# Patient Record
Sex: Female | Born: 1935 | Race: Black or African American | Hispanic: No | Smoking: Former smoker
Health system: Southern US, Community
[De-identification: ages and names within clinical notes are randomized; demographics above are authoritative.]

## PROBLEM LIST (undated history)

## (undated) DIAGNOSIS — M810 Age-related osteoporosis without current pathological fracture: Secondary | ICD-10-CM

## (undated) DIAGNOSIS — M549 Dorsalgia, unspecified: Secondary | ICD-10-CM

## (undated) DIAGNOSIS — R0602 Shortness of breath: Secondary | ICD-10-CM

## (undated) DIAGNOSIS — I2510 Atherosclerotic heart disease of native coronary artery without angina pectoris: Secondary | ICD-10-CM

## (undated) DIAGNOSIS — I498 Other specified cardiac arrhythmias: Secondary | ICD-10-CM

## (undated) DIAGNOSIS — I10 Essential (primary) hypertension: Secondary | ICD-10-CM

## (undated) DIAGNOSIS — E785 Hyperlipidemia, unspecified: Secondary | ICD-10-CM

## (undated) DIAGNOSIS — E119 Type 2 diabetes mellitus without complications: Secondary | ICD-10-CM

## (undated) DIAGNOSIS — I499 Cardiac arrhythmia, unspecified: Secondary | ICD-10-CM

## (undated) DIAGNOSIS — Z9981 Dependence on supplemental oxygen: Secondary | ICD-10-CM

## (undated) DIAGNOSIS — Z8719 Personal history of other diseases of the digestive system: Secondary | ICD-10-CM

## (undated) DIAGNOSIS — G20 Parkinson's disease: Secondary | ICD-10-CM

## (undated) DIAGNOSIS — C3490 Malignant neoplasm of unspecified part of unspecified bronchus or lung: Secondary | ICD-10-CM

## (undated) DIAGNOSIS — I5181 Takotsubo syndrome: Secondary | ICD-10-CM

## (undated) DIAGNOSIS — K5792 Diverticulitis of intestine, part unspecified, without perforation or abscess without bleeding: Secondary | ICD-10-CM

## (undated) DIAGNOSIS — I509 Heart failure, unspecified: Secondary | ICD-10-CM

## (undated) DIAGNOSIS — G20A1 Parkinson's disease without dyskinesia, without mention of fluctuations: Secondary | ICD-10-CM

## (undated) DIAGNOSIS — I209 Angina pectoris, unspecified: Secondary | ICD-10-CM

## (undated) DIAGNOSIS — M1990 Unspecified osteoarthritis, unspecified site: Secondary | ICD-10-CM

## (undated) DIAGNOSIS — J449 Chronic obstructive pulmonary disease, unspecified: Secondary | ICD-10-CM

## (undated) HISTORY — PX: ABDOMINAL HYSTERECTOMY: SHX81

## (undated) HISTORY — PX: LUNG CANCER SURGERY: SHX702

## (undated) HISTORY — DX: Chronic obstructive pulmonary disease, unspecified: J44.9

## (undated) HISTORY — DX: Heart failure, unspecified: I50.9

## (undated) HISTORY — PX: ECTOPIC PREGNANCY SURGERY: SHX613

## (undated) HISTORY — DX: Malignant neoplasm of unspecified part of unspecified bronchus or lung: C34.90

## (undated) HISTORY — PX: CHOLECYSTECTOMY: SHX55

## (undated) HISTORY — DX: Age-related osteoporosis without current pathological fracture: M81.0

---

## 2011-03-05 ENCOUNTER — Other Ambulatory Visit (HOSPITAL_COMMUNITY): Payer: Self-pay | Admitting: Nephrology

## 2011-03-05 DIAGNOSIS — N289 Disorder of kidney and ureter, unspecified: Secondary | ICD-10-CM

## 2011-03-20 ENCOUNTER — Ambulatory Visit (HOSPITAL_COMMUNITY)
Admission: RE | Admit: 2011-03-20 | Discharge: 2011-03-20 | Disposition: A | Discharge status: Home / Self Care | Payer: Medicare Other | Source: Ambulatory Visit | Attending: Nephrology | Admitting: Nephrology

## 2011-03-20 DIAGNOSIS — N289 Disorder of kidney and ureter, unspecified: Secondary | ICD-10-CM | POA: Insufficient documentation

## 2011-03-20 DIAGNOSIS — I10 Essential (primary) hypertension: Secondary | ICD-10-CM | POA: Insufficient documentation

## 2011-03-20 DIAGNOSIS — E119 Type 2 diabetes mellitus without complications: Secondary | ICD-10-CM | POA: Insufficient documentation

## 2011-05-18 ENCOUNTER — Emergency Department (HOSPITAL_COMMUNITY)
Admission: EM | Admit: 2011-05-18 | Discharge: 2011-05-18 | Disposition: A | Discharge status: Home / Self Care | Payer: Medicare Other | Attending: Emergency Medicine | Admitting: Emergency Medicine

## 2011-05-18 ENCOUNTER — Emergency Department (HOSPITAL_COMMUNITY): Payer: Medicare Other

## 2011-05-18 ENCOUNTER — Encounter (HOSPITAL_COMMUNITY): Payer: Self-pay | Admitting: Radiology

## 2011-05-18 DIAGNOSIS — R1032 Left lower quadrant pain: Secondary | ICD-10-CM | POA: Insufficient documentation

## 2011-05-18 DIAGNOSIS — I10 Essential (primary) hypertension: Secondary | ICD-10-CM | POA: Insufficient documentation

## 2011-05-18 DIAGNOSIS — Z79899 Other long term (current) drug therapy: Secondary | ICD-10-CM | POA: Insufficient documentation

## 2011-05-18 DIAGNOSIS — E119 Type 2 diabetes mellitus without complications: Secondary | ICD-10-CM | POA: Insufficient documentation

## 2011-05-18 DIAGNOSIS — K5730 Diverticulosis of large intestine without perforation or abscess without bleeding: Secondary | ICD-10-CM | POA: Insufficient documentation

## 2011-05-18 LAB — URINALYSIS, ROUTINE W REFLEX MICROSCOPIC
Bilirubin Urine: NEGATIVE
Glucose, UA: NEGATIVE mg/dL
Hgb urine dipstick: NEGATIVE
Ketones, ur: NEGATIVE mg/dL
Nitrite: NEGATIVE
Protein, ur: NEGATIVE mg/dL
Specific Gravity, Urine: 1.02 (ref 1.005–1.030)
Urobilinogen, UA: 0.2 mg/dL (ref 0.0–1.0)
pH: 5.5 (ref 5.0–8.0)

## 2011-05-18 LAB — BASIC METABOLIC PANEL
BUN: 27 mg/dL — ABNORMAL HIGH (ref 6–23)
CO2: 25 mEq/L (ref 19–32)
Calcium: 10.1 mg/dL (ref 8.4–10.5)
Chloride: 103 mEq/L (ref 96–112)
Creatinine, Ser: 1.65 mg/dL — ABNORMAL HIGH (ref 0.4–1.2)
GFR calc Af Amer: 37 mL/min — ABNORMAL LOW (ref >60)
GFR calc non Af Amer: 30 mL/min — ABNORMAL LOW (ref >60)
Glucose, Bld: 122 mg/dL — ABNORMAL HIGH (ref 70–99)
Potassium: 5 mEq/L (ref 3.5–5.1)
Sodium: 137 mEq/L (ref 135–145)

## 2011-05-18 LAB — CBC
HCT: 40.7 % (ref 36.0–46.0)
Hemoglobin: 13 g/dL (ref 12.0–15.0)
MCH: 27.6 pg (ref 26.0–34.0)
MCHC: 31.9 g/dL (ref 30.0–36.0)
MCV: 86.4 fL (ref 78.0–100.0)
Platelets: 263 10*3/uL (ref 150–400)
RBC: 4.71 MIL/uL (ref 3.87–5.11)
RDW: 15.6 % — ABNORMAL HIGH (ref 11.5–15.5)
WBC: 8 10*3/uL (ref 4.0–10.5)

## 2011-05-18 LAB — DIFFERENTIAL
Basophils Absolute: 0.1 10*3/uL (ref 0.0–0.1)
Basophils Relative: 1 % (ref 0–1)
Eosinophils Absolute: 0.7 10*3/uL (ref 0.0–0.7)
Eosinophils Relative: 9 % — ABNORMAL HIGH (ref 0–5)
Lymphocytes Relative: 25 % (ref 12–46)
Lymphs Abs: 2 10*3/uL (ref 0.7–4.0)
Monocytes Absolute: 1 10*3/uL (ref 0.1–1.0)
Monocytes Relative: 12 % (ref 3–12)
Neutro Abs: 4.2 10*3/uL (ref 1.7–7.7)
Neutrophils Relative %: 53 % (ref 43–77)

## 2011-05-18 LAB — URINE MICROSCOPIC-ADD ON

## 2012-04-19 ENCOUNTER — Emergency Department (HOSPITAL_COMMUNITY)
Admission: EM | Admit: 2012-04-19 | Discharge: 2012-04-19 | Disposition: A | Discharge status: Home / Self Care | Payer: Medicare Other | Attending: Emergency Medicine | Admitting: Emergency Medicine

## 2012-04-19 ENCOUNTER — Encounter (HOSPITAL_COMMUNITY): Payer: Self-pay | Admitting: *Deleted

## 2012-04-19 ENCOUNTER — Emergency Department (HOSPITAL_COMMUNITY): Payer: Medicare Other

## 2012-04-19 DIAGNOSIS — Z79899 Other long term (current) drug therapy: Secondary | ICD-10-CM | POA: Insufficient documentation

## 2012-04-19 DIAGNOSIS — Z8739 Personal history of other diseases of the musculoskeletal system and connective tissue: Secondary | ICD-10-CM | POA: Insufficient documentation

## 2012-04-19 DIAGNOSIS — I10 Essential (primary) hypertension: Secondary | ICD-10-CM | POA: Insufficient documentation

## 2012-04-19 DIAGNOSIS — R109 Unspecified abdominal pain: Secondary | ICD-10-CM | POA: Insufficient documentation

## 2012-04-19 DIAGNOSIS — E119 Type 2 diabetes mellitus without complications: Secondary | ICD-10-CM | POA: Insufficient documentation

## 2012-04-19 HISTORY — DX: Unspecified osteoarthritis, unspecified site: M19.90

## 2012-04-19 HISTORY — DX: Diverticulitis of intestine, part unspecified, without perforation or abscess without bleeding: K57.92

## 2012-04-19 HISTORY — DX: Personal history of other diseases of the digestive system: Z87.19

## 2012-04-19 LAB — COMPREHENSIVE METABOLIC PANEL
ALT: 13 U/L (ref 0–35)
AST: 18 U/L (ref 0–37)
Albumin: 3.6 g/dL (ref 3.5–5.2)
Alkaline Phosphatase: 60 U/L (ref 39–117)
BUN: 20 mg/dL (ref 6–23)
CO2: 26 mEq/L (ref 19–32)
Calcium: 10 mg/dL (ref 8.4–10.5)
Chloride: 103 mEq/L (ref 96–112)
Creatinine, Ser: 1.3 mg/dL — ABNORMAL HIGH (ref 0.50–1.10)
GFR calc Af Amer: 45 mL/min — ABNORMAL LOW (ref >90)
GFR calc non Af Amer: 39 mL/min — ABNORMAL LOW (ref >90)
Glucose, Bld: 104 mg/dL — ABNORMAL HIGH (ref 70–99)
Potassium: 4.8 mEq/L (ref 3.5–5.1)
Sodium: 138 mEq/L (ref 135–145)
Total Bilirubin: 0.2 mg/dL — ABNORMAL LOW (ref 0.3–1.2)
Total Protein: 7.5 g/dL (ref 6.0–8.3)

## 2012-04-19 LAB — DIFFERENTIAL
Basophils Absolute: 0 10*3/uL (ref 0.0–0.1)
Basophils Relative: 1 % (ref 0–1)
Eosinophils Absolute: 0.3 10*3/uL (ref 0.0–0.7)
Eosinophils Relative: 6 % — ABNORMAL HIGH (ref 0–5)
Lymphocytes Relative: 31 % (ref 12–46)
Lymphs Abs: 1.8 10*3/uL (ref 0.7–4.0)
Monocytes Absolute: 0.7 10*3/uL (ref 0.1–1.0)
Monocytes Relative: 11 % (ref 3–12)
Neutro Abs: 3.1 10*3/uL (ref 1.7–7.7)
Neutrophils Relative %: 52 % (ref 43–77)

## 2012-04-19 LAB — CBC
HCT: 39.8 % (ref 36.0–46.0)
Hemoglobin: 12.6 g/dL (ref 12.0–15.0)
MCH: 27.5 pg (ref 26.0–34.0)
MCHC: 31.7 g/dL (ref 30.0–36.0)
MCV: 86.7 fL (ref 78.0–100.0)
Platelets: 205 10*3/uL (ref 150–400)
RBC: 4.59 MIL/uL (ref 3.87–5.11)
RDW: 15.2 % (ref 11.5–15.5)
WBC: 5.9 10*3/uL (ref 4.0–10.5)

## 2012-04-19 LAB — URINALYSIS, ROUTINE W REFLEX MICROSCOPIC
Bilirubin Urine: NEGATIVE
Glucose, UA: NEGATIVE mg/dL
Hgb urine dipstick: NEGATIVE
Ketones, ur: NEGATIVE mg/dL
Leukocytes, UA: NEGATIVE
Nitrite: NEGATIVE
Protein, ur: NEGATIVE mg/dL
Specific Gravity, Urine: 1.01 (ref 1.005–1.030)
Urobilinogen, UA: 0.2 mg/dL (ref 0.0–1.0)
pH: 5 (ref 5.0–8.0)

## 2012-04-19 LAB — LACTIC ACID, PLASMA: Lactic Acid, Venous: 1.6 mmol/L (ref 0.5–2.2)

## 2012-04-19 LAB — PROCALCITONIN: Procalcitonin: 0.05 ng/mL

## 2012-04-19 LAB — LIPASE, BLOOD: Lipase: 68 U/L — ABNORMAL HIGH (ref 11–59)

## 2012-04-19 MED ORDER — HYDROCODONE-ACETAMINOPHEN 5-325 MG PO TABS: ORAL_TABLET | ORAL | Status: AC

## 2012-04-19 MED ORDER — SODIUM CHLORIDE 0.9 % IV SOLN
INTRAVENOUS | Status: DC
  Administered 2012-04-19: 14:00:00 via INTRAVENOUS

## 2012-04-19 MED ORDER — IOHEXOL 300 MG/ML  SOLN
100.0000 mL | Freq: Once | INTRAMUSCULAR | Status: AC | PRN
  Administered 2012-04-19: 100 mL via INTRAVENOUS

## 2012-04-19 MED ORDER — ONDANSETRON HCL 4 MG/2ML IJ SOLN
4.0000 mg | INTRAMUSCULAR | Status: DC | PRN
  Administered 2012-04-19: 4 mg via INTRAVENOUS
  Filled 2012-04-19: qty 2

## 2012-04-19 MED ORDER — MORPHINE SULFATE 4 MG/ML IJ SOLN
4.0000 mg | INTRAMUSCULAR | Status: DC | PRN
  Administered 2012-04-19: 4 mg via INTRAVENOUS
  Filled 2012-04-19: qty 1

## 2012-04-19 NOTE — Discharge Instructions (Signed)
RESOURCE GUIDE  Dental Problems  Patients with Medicaid: [facility]                     [facility] [address]                                           [address] Phone:  [phone]                                                  Phone:  [phone]  If unable to pay or uninsured, contact:  [facility] or [facility] to become qualified for the adult dental clinic.  Chronic Pain Problems Contact [facility]  [phone] Patients need to be referred by their primary care doctor.  Insufficient Money for Medicine Contact [employer] Way:  call "211" or [facility] [phone].  No Primary Care Doctor Call [facility]  [phone] Other agencies that provide inexpensive medical care    [facility]  [phone]    [facility]  [phone]    [facility]  [phone]    [facility] Clinic  [phone]    [facility]  [phone]    [facility]  [phone]  Psychological Services [facility]  [phone] [facility]  [phone] [facility]   [phone] (emergency services [phone])  Substance Abuse Resources [facility]  336-882-2125 [facility] 336-784-9470 [facility] 336-285-9073 [facility] 336-845-3988 Residential & Outpatient Substance Abuse Program  800-659-3381  Abuse/Neglect [place] Child Abuse Hotline (336) 641-3795 [place] Child Abuse Hotline 800-378-5315 (After Hours)  Emergency Shelter [facility] (336) 271-5985  Maternity [facility] (336) 275-9566 [facility] (704) [phone]  MRSA Hotline #:   832-7006    [place] [facility]  [facility] [place] [facility]     [employer]                          [facility] [address] [place]                       [address]      [address]  [place]                                                [place]                            [place] Phone:  [phone]                                   Phone:  [phone]                 Phone:  [phone]  [facility] Phone:  [phone]    [facility] Child Abuse Hotline 531-008-4666 [phone] (After Hours)   Take the prescription as directed.  Call your regular medical doctor today to schedule a follow up appointment within the next 3 days.  Return to the Emergency Department immediately sooner if worsening.

## 2012-04-19 NOTE — ED Provider Notes (Signed)
History     CSN: [id number]  Arrival date & time 04/19/12  1134   First MD Initiated Contact with Patient 04/19/12 1224      Chief Complaint  Patient presents with  . Abdominal Pain     HPI Pt seen at 1245.  Per pt, c/o gradual onset and persistence of constant generalized abd "pain" for the past 5 days, worse over the past 2 days.  Describes the pain as "soreness."  Denies N/V/D, no back pain, no fevers, no CP/SOB, no dysuria.  Past Medical History  Diagnosis Date  . Cancer     left lung/ 2009/ surg only  . Hypertension   . Arthritis   . Diverticulitis   . Diabetes mellitus   . H/O ventral hernia     Past Surgical History  Procedure Date  . Cholecystectomy   . Ectopic pregnancy surgery   . Abdominal hysterectomy   . Lung cancer surgery     Family History  Problem Relation Age of Onset  . Diabetes Mother   . Hypertension Mother   . Diabetes Father     History  Substance Use Topics  . Smoking status: Former Games developer  . Smokeless tobacco: Not on file  . Alcohol Use: No     Review of Systems ROS: Statement: All systems negative except as marked or noted in the HPI; Constitutional: Negative for fever and chills. ; ; Eyes: Negative for eye pain, redness and discharge. ; ; ENMT: Negative for ear pain, hoarseness, nasal congestion, sinus pressure and sore throat. ; ; Cardiovascular: Negative for chest pain, palpitations, diaphoresis, dyspnea and peripheral edema. ; ; Respiratory: Negative for cough, wheezing and stridor. ; ; Gastrointestinal: +abd pain. Negative for nausea, vomiting, diarrhea, blood in stool, hematemesis, jaundice and rectal bleeding. . ; ; Genitourinary: Negative for dysuria, flank pain and hematuria. ; ; Musculoskeletal: Negative for back pain and neck pain. Negative for swelling and trauma.; ; Skin: Negative for pruritus, rash, abrasions, blisters, bruising and skin lesion.; ; Neuro: Negative for headache, lightheadedness and neck stiffness. Negative for  weakness, altered level of consciousness , altered mental status, extremity weakness, paresthesias, involuntary movement, seizure and syncope.    Allergies  Review of patient's allergies indicates no known allergies.  Home Medications   Current Outpatient Rx  Name Route Sig Dispense Refill  . ALBUTEROL SULFATE HFA 108 (90 BASE) MCG/ACT IN AERS Inhalation Inhale 2 puffs into the lungs every 6 (six) hours as needed. Shortness of Breath    . ASPIRIN EC 81 MG PO TBEC Oral Take 81 mg by mouth daily.    Marland Kitchen BENAZEPRIL-HYDROCHLOROTHIAZIDE 20-12.5 MG PO TABS Oral Take 1 tablet by mouth daily.    . BUPROPION HCL ER (SR) 150 MG PO TB12 Oral Take 150 mg by mouth 2 (two) times daily.    Marland Kitchen CALCIUM CARBONATE 1250 MG PO TABS Oral Take 1 tablet by mouth daily.    Marland Kitchen CETIRIZINE HCL 10 MG PO TABS Oral Take 10 mg by mouth daily.    . OMEGA-3 FATTY ACIDS 1000 MG PO CAPS Oral Take 1 g by mouth daily.    Marland Kitchen GABAPENTIN 300 MG PO CAPS Oral Take 300 mg by mouth 2 (two) times daily.    Marland Kitchen GLIPIZIDE 5 MG PO TABS Oral Take 5 mg by mouth 2 (two) times daily.    Marland Kitchen MECLIZINE HCL 25 MG PO TABS Oral Take 25 mg by mouth 4 (four) times daily as needed. Dizziness    . OMEPRAZOLE  20 MG PO CPDR Oral Take 20 mg by mouth daily.    Marland Kitchen SALINE NASAL SPRAY 0.65 % NA SOLN Nasal Place 1 spray into the nose as needed. Dry Nostrils    . TIOTROPIUM BROMIDE MONOHYDRATE 18 MCG IN CAPS Inhalation Place 18 mcg into inhaler and inhale daily.      BP 134/56  Pulse 80  Temp(Src) 97.9 F (36.6 C) (Oral)  Resp 18  Ht 5\' 4"  (1.626 m)  Wt 157 lb (71.215 kg)  BMI 26.95 kg/m2  SpO2 98%  Physical Exam 1250: Physical examination:  Nursing notes reviewed; Vital signs and O2 SAT reviewed;  Constitutional: Well developed, Well nourished, Well hydrated, In no acute distress; Head:  Normocephalic, atraumatic; Eyes: EOMI, PERRL, No scleral icterus; ENMT: Mouth and pharynx normal, Mucous membranes moist; Neck: Supple, Full range of motion, No  lymphadenopathy; Cardiovascular: Regular rate and rhythm, No murmur, rub, or gallop; Respiratory: Breath sounds clear & equal bilaterally, No rales, rhonchi, wheezes, or rub, Normal respiratory effort/excursion; Chest: Nontender, Movement normal; Abdomen: Soft, +mild generalized TTP, +soft reducible umbilical hernia without overlying erythema or ecchymosis. Nontender, Nondistended, Normal bowel sounds; Extremities: Pulses normal, No tenderness, No edema, No calf edema or asymmetry.; Neuro: AA&Ox3, Major CN grossly intact.  No gross focal motor or sensory deficits in extremities.; Skin: Color normal, Warm, Dry, no rash.    ED Course  Procedures     MDM  MDM Reviewed: nursing note, previous chart and vitals Interpretation: labs and CT scan   Results for orders placed during the hospital encounter of 04/19/12  CBC      Component Value Range   WBC 5.9  4.0 - 10.5 (K/uL)   RBC 4.59  3.87 - 5.11 (MIL/uL)   Hemoglobin 12.6  12.0 - 15.0 (g/dL)   HCT 62.1  30.8 - 65.7 (%)   MCV 86.7  78.0 - 100.0 (fL)   MCH 27.5  26.0 - 34.0 (pg)   MCHC 31.7  30.0 - 36.0 (g/dL)   RDW 84.6  96.2 - 95.2 (%)   Platelets 205  150 - 400 (K/uL)  DIFFERENTIAL      Component Value Range   Neutrophils Relative 52  43 - 77 (%)   Neutro Abs 3.1  1.7 - 7.7 (K/uL)   Lymphocytes Relative 31  12 - 46 (%)   Lymphs Abs 1.8  0.7 - 4.0 (K/uL)   Monocytes Relative 11  3 - 12 (%)   Monocytes Absolute 0.7  0.1 - 1.0 (K/uL)   Eosinophils Relative 6 (*) 0 - 5 (%)   Eosinophils Absolute 0.3  0.0 - 0.7 (K/uL)   Basophils Relative 1  0 - 1 (%)   Basophils Absolute 0.0  0.0 - 0.1 (K/uL)  COMPREHENSIVE METABOLIC PANEL      Component Value Range   Sodium 138  135 - 145 (mEq/L)   Potassium 4.8  3.5 - 5.1 (mEq/L)   Chloride 103  96 - 112 (mEq/L)   CO2 26  19 - 32 (mEq/L)   Glucose, Bld 104 (*) 70 - 99 (mg/dL)   BUN 20  6 - 23 (mg/dL)   Creatinine, Ser 8.41 (*) 0.50 - 1.10 (mg/dL)   Calcium 32.4  8.4 - 10.5 (mg/dL)   Total  Protein 7.5  6.0 - 8.3 (g/dL)   Albumin 3.6  3.5 - 5.2 (g/dL)   AST 18  0 - 37 (U/L)   ALT 13  0 - 35 (U/L)   Alkaline Phosphatase 60  39 - 117 (U/L)   Total Bilirubin 0.2 (*) 0.3 - 1.2 (mg/dL)   GFR calc non Af Amer 39 (*) >90 (mL/min)   GFR calc Af Amer 45 (*) >90 (mL/min)  LIPASE, BLOOD      Component Value Range   Lipase 68 (*) 11 - 59 (U/L)  LACTIC ACID, PLASMA      Component Value Range   Lactic Acid, Venous 1.6  0.5 - 2.2 (mmol/L)  PROCALCITONIN      Component Value Range   Procalcitonin 0.05    URINALYSIS, ROUTINE W REFLEX MICROSCOPIC      Component Value Range   Color, Urine YELLOW  YELLOW    APPearance CLEAR  CLEAR    Specific Gravity, Urine 1.010  1.005 - 1.030    pH 5.0  5.0 - 8.0    Glucose, UA NEGATIVE  NEGATIVE (mg/dL)   Hgb urine dipstick NEGATIVE  NEGATIVE    Bilirubin Urine NEGATIVE  NEGATIVE    Ketones, ur NEGATIVE  NEGATIVE (mg/dL)   Protein, ur NEGATIVE  NEGATIVE (mg/dL)   Urobilinogen, UA 0.2  0.0 - 1.0 (mg/dL)   Nitrite NEGATIVE  NEGATIVE    Leukocytes, UA NEGATIVE  NEGATIVE    Ct Abdomen Pelvis W Contrast 04/19/2012  *RADIOLOGY REPORT*  Clinical Data: Abdominal pain.  History of lung cancer.  History of diverticulitis.  Diabetes.  CT ABDOMEN AND PELVIS WITH CONTRAST  Technique:  Multidetector CT imaging of the abdomen and pelvis was performed following the standard protocol during bolus administration of intravenous contrast.  Contrast: OMNIPAQUE IOHEXOL 300 MG/ML  SOLN  Comparison: 05/18/2011  Findings: There is mild scarring at the lung bases left more than right .  No pleural or pericardial fluid. There is a small hiatal hernia.  The liver parenchyma is normal.  There is been previous cholecystectomy and there is mild biliary ductal prominence, frequently seen following that procedure.  The spleen is normal. The pancreas is normal.  The adrenal glands are normal.  The kidneys are normal.  The aorta shows atherosclerosis but no aneurysm.  Maximal  transverse diameter is 1.8 cm.  The IVC is normal.  No retroperitoneal mass or adenopathy.  There is a ventral hernia containing only fat.  There is diverticulosis of the sigmoid colon without evidence of active diverticulitis.  There has been previous hysterectomy.  No bladder pathology is seen.  Mild degenerative changes effect the spine.  IMPRESSION: No acute finding.  Previous cholecystectomy and hysterectomy. Scarring at the lung bases.  Atherosclerosis of the aorta. Diverticulosis without evidence of diverticulitis.  Small ventral hernia containing only fat.  Original Report Authenticated By: Thomasenia Sales, M.D.    Results for TORRA, PALA (MRN [id number]) as of 04/19/2012 15:31  Ref. Range 05/18/2011 10:53 04/19/2012 13:07  BUN Latest Range: 6-23 mg/dL 27 (H) 20  Creat Latest Range: 0.50-1.10 mg/dL 1.47 (H) 8.29 (H)      3:36 PM:  Feels better and wants to go home now.  No N/V/D while in the ED.  VS remain stable.  Known umbilical/ventral hernia, does not contain bowel, unchanged from previous CT scan in 2012 and pt herself denies any change to her.  Dx testing d/w pt and family.  Questions answered.  Verb understanding, agreeable to d/c home with outpt f/u.       Laray Anger, DO 04/22/12 [phone]

## 2012-04-19 NOTE — ED Notes (Signed)
Attempted IV x2 without success  

## 2012-04-19 NOTE — ED Notes (Signed)
Abd pain for 5 days and worsening.  No NVD.  Last BM Saturday after taking laxative.

## 2012-04-21 LAB — URINE CULTURE
Colony Count: NO GROWTH
Culture  Setup Time: 201305140403
Culture: NO GROWTH

## 2012-07-28 ENCOUNTER — Other Ambulatory Visit (HOSPITAL_COMMUNITY): Payer: Self-pay | Admitting: "Endocrinology

## 2012-07-28 DIAGNOSIS — M810 Age-related osteoporosis without current pathological fracture: Secondary | ICD-10-CM

## 2012-08-03 ENCOUNTER — Other Ambulatory Visit (HOSPITAL_COMMUNITY): Payer: Medicare Other

## 2012-08-06 ENCOUNTER — Ambulatory Visit (HOSPITAL_COMMUNITY)
Admission: RE | Admit: 2012-08-06 | Discharge: 2012-08-06 | Disposition: A | Discharge status: Home / Self Care | Payer: Medicare Other | Source: Ambulatory Visit | Attending: "Endocrinology | Admitting: "Endocrinology

## 2012-08-06 DIAGNOSIS — M810 Age-related osteoporosis without current pathological fracture: Secondary | ICD-10-CM | POA: Insufficient documentation

## 2012-08-16 ENCOUNTER — Ambulatory Visit (HOSPITAL_COMMUNITY): Payer: Medicare Other

## 2012-08-20 ENCOUNTER — Encounter (HOSPITAL_COMMUNITY): Payer: Medicare Other | Attending: Oncology

## 2012-08-20 ENCOUNTER — Encounter (HOSPITAL_COMMUNITY): Payer: Self-pay

## 2012-08-20 VITALS — BP 114/61 | HR 113 | Temp 97.6°F | Resp 22 | Ht 64.0 in | Wt 165.2 lb

## 2012-08-20 DIAGNOSIS — D649 Anemia, unspecified: Secondary | ICD-10-CM

## 2012-08-20 DIAGNOSIS — E559 Vitamin D deficiency, unspecified: Secondary | ICD-10-CM

## 2012-08-20 DIAGNOSIS — C3430 Malignant neoplasm of lower lobe, unspecified bronchus or lung: Secondary | ICD-10-CM

## 2012-08-20 DIAGNOSIS — Z09 Encounter for follow-up examination after completed treatment for conditions other than malignant neoplasm: Secondary | ICD-10-CM | POA: Insufficient documentation

## 2012-08-20 DIAGNOSIS — I10 Essential (primary) hypertension: Secondary | ICD-10-CM

## 2012-08-20 DIAGNOSIS — Z85118 Personal history of other malignant neoplasm of bronchus and lung: Secondary | ICD-10-CM | POA: Insufficient documentation

## 2012-08-20 DIAGNOSIS — N189 Chronic kidney disease, unspecified: Secondary | ICD-10-CM

## 2012-08-20 DIAGNOSIS — C3490 Malignant neoplasm of unspecified part of unspecified bronchus or lung: Secondary | ICD-10-CM

## 2012-08-20 LAB — PROTEIN / CREATININE RATIO, URINE
Creatinine, Urine: 113.37 mg/dL
Protein Creatinine Ratio: 0.07 (ref 0.00–0.15)
Total Protein, Urine: 8 mg/dL

## 2012-08-20 LAB — CBC WITH DIFFERENTIAL/PLATELET
Basophils Absolute: 0 10*3/uL (ref 0.0–0.1)
Basophils Relative: 1 % (ref 0–1)
Eosinophils Absolute: 0.2 10*3/uL (ref 0.0–0.7)
Eosinophils Relative: 4 % (ref 0–5)
HCT: 41.9 % (ref 36.0–46.0)
Hemoglobin: 13.4 g/dL (ref 12.0–15.0)
Lymphocytes Relative: 32 % (ref 12–46)
Lymphs Abs: 2 10*3/uL (ref 0.7–4.0)
MCH: 28.2 pg (ref 26.0–34.0)
MCHC: 32 g/dL (ref 30.0–36.0)
MCV: 88.2 fL (ref 78.0–100.0)
Monocytes Absolute: 0.8 10*3/uL (ref 0.1–1.0)
Monocytes Relative: 13 % — ABNORMAL HIGH (ref 3–12)
Neutro Abs: 3.2 10*3/uL (ref 1.7–7.7)
Neutrophils Relative %: 51 % (ref 43–77)
Platelets: 226 10*3/uL (ref 150–400)
RBC: 4.75 MIL/uL (ref 3.87–5.11)
RDW: 15.1 % (ref 11.5–15.5)
WBC: 6.2 10*3/uL (ref 4.0–10.5)

## 2012-08-20 LAB — RENAL FUNCTION PANEL
Albumin: 3.8 g/dL (ref 3.5–5.2)
BUN: 25 mg/dL — ABNORMAL HIGH (ref 6–23)
CO2: 24 mEq/L (ref 19–32)
Calcium: 9.9 mg/dL (ref 8.4–10.5)
Chloride: 97 mEq/L (ref 96–112)
Creatinine, Ser: 1.26 mg/dL — ABNORMAL HIGH (ref 0.50–1.10)
GFR calc Af Amer: 47 mL/min — ABNORMAL LOW (ref >90)
GFR calc non Af Amer: 40 mL/min — ABNORMAL LOW (ref >90)
Glucose, Bld: 122 mg/dL — ABNORMAL HIGH (ref 70–99)
Phosphorus: 2.9 mg/dL (ref 2.3–4.6)
Potassium: 4.5 mEq/L (ref 3.5–5.1)
Sodium: 132 mEq/L — ABNORMAL LOW (ref 135–145)

## 2012-08-20 NOTE — Progress Notes (Signed)
[facility] [phone]   [phone]  CONSULT NOTE  REASON FOR CONSULTATION:  78 years old [place] American female with history of lung cancer.  HPI Angela Lawson is a 78 y.o. female  Was past medical history significant for hypertension, diabetes mellitus, osteoporosis, diverticulosis and remote history of smoking. The patient was seen by her primary care physician in early July of 2009 for routine physical examination and she requested chest x-ray to be done. It showed lesion in the left lower lobe which was confirmed by CT scan of the chest and also she had a PET scan that showed hypermetabolic activity in the lung lesion. The patient underwent left lower lobectomy with radical lymph node dissection under the care of Dr. Claude Manges in [place] on 07/07/2008. According to his note, the final pathology was consistent with a stage IA non-small cell lung cancer, poorly differentiated adenocarcinoma with the lymphatic invasion but no vascular invasion. The patient did not receive any adjuvant chemotherapy according to the guidelines. She was followed by observation and repeat CT scan of the chest every 6 months. Dr. Claude Manges left [facility] and the patient is here today to establish care with a medical oncologist to continue surveillance for her lung cancer. Her last CT scan of the chest was performed 6 months ago and it showed no evidence for disease recurrence. She has the right breast biopsies performed in October of 2011 that were unremarkable for any malignancy. The patient is feeling fine today with no specific complaints. She denied having any significant chest pain, shortness of breath except with exertion, cough or hemoptysis. She has no significant weight loss or night sweats. She denied having any significant visual changes or headache.. Family history significant for her mother and father with diabetes mellitus, brother had lung cancer. The  patient is a widow and has 3 biologic children and one adopted. She used to work as a Agricultural engineer. She has a history of smoking less than one pack per day for around 30 years, quit in 2002. No history of alcohol or drug abuse.  @SFHPI @  Past Medical History  Diagnosis Date  . Cancer     left lung/ 2009/ surg only  . Hypertension   . Arthritis   . Diverticulitis   . Diabetes mellitus   . H/O ventral hernia   . Lung cancer     Past Surgical History  Procedure Date  . Cholecystectomy   . Ectopic pregnancy surgery   . Abdominal hysterectomy   . Lung cancer surgery     Family History  Problem Relation Age of Onset  . Diabetes Mother   . Hypertension Mother   . Diabetes Father     Social History History  Substance Use Topics  . Smoking status: Former Games developer  . Smokeless tobacco: Not on file  . Alcohol Use: No    No Known Allergies  Current Outpatient Prescriptions  Medication Sig Dispense Refill  . albuterol (PROVENTIL HFA;VENTOLIN HFA) 108 (90 BASE) MCG/ACT inhaler Inhale 2 puffs into the lungs every 6 (six) hours as needed. Shortness of Breath      . aspirin EC 81 MG tablet Take 81 mg by mouth daily.      . benazepril-hydrochlorthiazide (LOTENSIN HCT) 20-12.5 MG per tablet Take 1 tablet by mouth daily.      Marland Kitchen buPROPion (WELLBUTRIN SR) 150 MG 12 hr tablet Take 150 mg by mouth 2 (two) times daily.      Marland Kitchen  calcium carbonate (OS-CAL - DOSED IN MG OF ELEMENTAL CALCIUM) 1250 MG tablet Take 1 tablet by mouth daily.      . cetirizine (ZYRTEC) 10 MG tablet Take 10 mg by mouth daily.      . fish oil-omega-3 fatty acids 1000 MG capsule Take 1 g by mouth daily.      Marland Kitchen gabapentin (NEURONTIN) 300 MG capsule Take 300 mg by mouth 2 (two) times daily.      . meclizine (ANTIVERT) 25 MG tablet Take 25 mg by mouth 4 (four) times daily as needed. Dizziness      . omeprazole (PRILOSEC) 20 MG capsule Take 20 mg by mouth daily.      . sodium chloride (OCEAN) 0.65 % nasal spray Place 1  spray into the nose as needed. Dry Nostrils      . tiotropium (SPIRIVA) 18 MCG inhalation capsule Place 18 mcg into inhaler and inhale daily.      . TRADJENTA 5 MG TABS tablet Take 5 mg by mouth daily.         Review of Systems  A comprehensive review of systems was negative except for: Respiratory: positive for dyspnea on exertion  Physical Exam  JYN:WGNFA, healthy, no distress, well nourished and well developed SKIN: skin color, texture, turgor are normal HEAD: Normocephalic, No masses, lesions, tenderness or abnormalities EYES: normal, PERRLA EARS: External ears normal OROPHARYNX:no exudate and no erythema  NECK: supple, no adenopathy LYMPH:  no palpable lymphadenopathy, no hepatosplenomegaly BREAST:not examined LUNGS: clear to auscultation  HEART: regular rate & rhythm, no murmurs and no gallops ABDOMEN:abdomen soft and non-tender BACK: Back symmetric, no curvature. EXTREMITIES:no joint deformities, effusion, or inflammation, no edema, no skin discoloration, no clubbing, no cyanosis  NEURO: alert & oriented x 3 with fluent speech, no focal motor/sensory deficits  PERFORMANCE STATUS: ECOG 1  LABORATORY DATA: Lab Results  Component Value Date   WBC 5.9 04/19/2012   HGB 12.6 04/19/2012   HCT 39.8 04/19/2012   MCV 86.7 04/19/2012   PLT 205 04/19/2012      Chemistry      Component Value Date/Time   NA 138 04/19/2012 1307   K 4.8 04/19/2012 1307   CL 103 04/19/2012 1307   CO2 26 04/19/2012 1307   BUN 20 04/19/2012 1307   CREATININE 1.30* 04/19/2012 1307      Component Value Date/Time   CALCIUM 10.0 04/19/2012 1307   ALKPHOS 60 04/19/2012 1307   AST 18 04/19/2012 1307   ALT 13 04/19/2012 1307   BILITOT 0.2* 04/19/2012 1307       RADIOGRAPHIC STUDIES: Dg Bone Density  08/06/2012  The Bone Mineral Densitometry hard-copy report (which includes all data, graphical display, and FRAX results when applicable) has been sent directly to the ordering physician.  This report can also  be obtained electronically by viewing images for this exam through the performing facility's EMR, or by logging directly into [employer].   Original Report Authenticated By: Crawford Givens, M.D.     ASSESSMENT: This is a very pleasant 78 years old [place] American female with history of stage IA non-small cell lung cancer diagnosed in July of 2009 status post left lower lobectomy with lymph node dissection. The patient has been observation for the last 4 years with no evidence for disease recurrence.  PLAN: I have a lengthy discussion with the patient today about her condition. I recommended for her to continue on observation with annual CT scan of the chest. Her next CT scan  of the chest will be scheduled in 6 months as the last one was performed in 6 months ago. She would come back for followup visit at that time to see Dr. Mariel Sleet. She was advised to call the clinic immediately if she has any concerning symptoms in the interval. The patient will continue under her primary care physician for management of her diabetes mellitus and hypertension.  All questions were answered. The patient knows to call the clinic with any problems, questions or concerns. We can certainly see the patient much sooner if necessary.  Thank you so much for allowing me to participate in the care of Angela Lawson. I will continue to follow up the patient with you and assist in her care.  I spent 30 minutes counseling the patient face to face. The total time spent in the appointment was 50 minutes.  Imagene Boss K. 08/20/2012, 1:49 PM

## 2012-08-20 NOTE — Patient Instructions (Addendum)
[facility]  Discharge Instructions  RECOMMENDATIONS MADE BY THE CONSULTANT AND ANY TEST RESULTS WILL BE SENT TO YOUR REFERRING DOCTOR.   EXAM FINDINGS BY MD TODAY AND SIGNS AND SYMPTOMS TO REPORT TO CLINIC OR PRIMARY MD: Exam and discussion by MD.  MEDICATIONS PRESCRIBED: none   INSTRUCTIONS GIVEN AND DISCUSSED: Other :  Report increased shortness of breath, cough, blood in your sputum, etc.  SPECIAL INSTRUCTIONS/FOLLOW-UP: Lab work Needed in 6 months and CT scan of your chest and Return to Clinic for follow-up with MD in 6 months.   I acknowledge that I have been informed and understand all the instructions given to me and received a copy. I do not have any more questions at this time, but understand that I may call the Specialty Clinic at [facility] at [phone] during business hours should I have any further questions or need assistance in obtaining follow-up care.    __________________________________________  _____________  __________ Signature of Patient or Authorized Representative            Date                   Time    __________________________________________ Nurse's Signature

## 2012-08-21 LAB — VITAMIN D 25 HYDROXY (VIT D DEFICIENCY, FRACTURES): Vit D, 25-Hydroxy: 54 ng/mL (ref 30–89)

## 2012-08-23 LAB — PTH, INTACT AND CALCIUM
Calcium, Total (PTH): 10 mg/dL (ref 8.4–10.5)
PTH: 63.9 pg/mL (ref 14.0–72.0)

## 2012-11-25 ENCOUNTER — Encounter (HOSPITAL_COMMUNITY): Payer: Medicare Other

## 2012-12-03 ENCOUNTER — Inpatient Hospital Stay (HOSPITAL_COMMUNITY): Admission: RE | Admit: 2012-12-03 | Payer: Medicare Other | Source: Ambulatory Visit

## 2012-12-03 ENCOUNTER — Ambulatory Visit (HOSPITAL_COMMUNITY)
Admission: RE | Admit: 2012-12-03 | Discharge: 2012-12-03 | Disposition: A | Discharge status: Home / Self Care | Payer: Medicare Other | Source: Ambulatory Visit | Attending: Pulmonary Disease | Admitting: Pulmonary Disease

## 2012-12-03 ENCOUNTER — Other Ambulatory Visit (HOSPITAL_COMMUNITY): Payer: Self-pay | Admitting: Pulmonary Disease

## 2012-12-03 DIAGNOSIS — J449 Chronic obstructive pulmonary disease, unspecified: Secondary | ICD-10-CM | POA: Insufficient documentation

## 2012-12-03 DIAGNOSIS — J4489 Other specified chronic obstructive pulmonary disease: Secondary | ICD-10-CM | POA: Insufficient documentation

## 2012-12-03 MED ORDER — ALBUTEROL SULFATE (5 MG/ML) 0.5% IN NEBU
2.5000 mg | INHALATION_SOLUTION | Freq: Once | RESPIRATORY_TRACT | Status: AC
  Administered 2012-12-03: 2.5 mg via RESPIRATORY_TRACT

## 2012-12-06 ENCOUNTER — Encounter (HOSPITAL_COMMUNITY): Payer: Medicare Other

## 2012-12-10 NOTE — Procedures (Signed)
Angela Lawson, Angela Lawson                 ACCOUNT NO.:  [id number]  MEDICAL RECORD NO.:  [id number]  LOCATION:                                 FACILITY:  PHYSICIAN:  Jozef Eisenbeis L. Juanetta Gosling, M.D.DATE OF BIRTH:  25-Oct-1934  DATE OF PROCEDURE:  12/09/2012 DATE OF DISCHARGE:                           PULMONARY FUNCTION TEST   REASON FOR PULMONARY FUNCTION TESTING:  COPD.  1. Spirometry shows a moderate ventilatory defect with evidence of     airflow obstruction. 2. Lung volumes are normal. 3. DLCO is severely reduced, but does correct slightly when volume is     taken into account. 4. Airway resistance is high confirming the presence of airflow     obstruction. 5. There is improvement with inhaled bronchodilator that approaches,     but does not reach significance. 6. Please note the DLCO may be somewhat inaccurate because of a mouth     pressure error.     Hassen Bruun L. Juanetta Gosling, M.D.     ELH/MEDQ  D:  12/09/2012  T:  12/10/2012  Job:  [id number]

## 2012-12-23 LAB — PULMONARY FUNCTION TEST

## 2013-01-08 ENCOUNTER — Emergency Department (HOSPITAL_COMMUNITY)
Admission: EM | Admit: 2013-01-08 | Discharge: 2013-01-08 | Disposition: A | Discharge status: Home / Self Care | Payer: Medicare Other | Attending: Emergency Medicine | Admitting: Emergency Medicine

## 2013-01-08 ENCOUNTER — Emergency Department (HOSPITAL_COMMUNITY): Payer: Medicare Other

## 2013-01-08 ENCOUNTER — Encounter (HOSPITAL_COMMUNITY): Payer: Self-pay | Admitting: *Deleted

## 2013-01-08 DIAGNOSIS — Z79899 Other long term (current) drug therapy: Secondary | ICD-10-CM | POA: Insufficient documentation

## 2013-01-08 DIAGNOSIS — E119 Type 2 diabetes mellitus without complications: Secondary | ICD-10-CM | POA: Insufficient documentation

## 2013-01-08 DIAGNOSIS — M25519 Pain in unspecified shoulder: Secondary | ICD-10-CM | POA: Insufficient documentation

## 2013-01-08 DIAGNOSIS — M25512 Pain in left shoulder: Secondary | ICD-10-CM

## 2013-01-08 DIAGNOSIS — Z7982 Long term (current) use of aspirin: Secondary | ICD-10-CM | POA: Insufficient documentation

## 2013-01-08 DIAGNOSIS — Z8719 Personal history of other diseases of the digestive system: Secondary | ICD-10-CM | POA: Insufficient documentation

## 2013-01-08 DIAGNOSIS — Z87891 Personal history of nicotine dependence: Secondary | ICD-10-CM | POA: Insufficient documentation

## 2013-01-08 DIAGNOSIS — I10 Essential (primary) hypertension: Secondary | ICD-10-CM | POA: Insufficient documentation

## 2013-01-08 DIAGNOSIS — Z85118 Personal history of other malignant neoplasm of bronchus and lung: Secondary | ICD-10-CM | POA: Insufficient documentation

## 2013-01-08 DIAGNOSIS — M129 Arthropathy, unspecified: Secondary | ICD-10-CM | POA: Insufficient documentation

## 2013-01-08 MED ORDER — IBUPROFEN 600 MG PO TABS: 600.0000 mg | ORAL_TABLET | Freq: Three times a day (TID) | ORAL | Status: DC | PRN

## 2013-01-08 MED ORDER — OXYCODONE-ACETAMINOPHEN 5-325 MG PO TABS
1.0000 | ORAL_TABLET | Freq: Once | ORAL | Status: AC
  Administered 2013-01-08: 1 via ORAL
  Filled 2013-01-08: qty 1

## 2013-01-08 MED ORDER — HYDROCODONE-ACETAMINOPHEN 5-325 MG PO TABS: 1.0000 | ORAL_TABLET | ORAL | Status: DC | PRN

## 2013-01-08 NOTE — ED Provider Notes (Signed)
History     CSN: [id number]  Arrival date & time 01/08/13  1349   First MD Initiated Contact with Patient 01/08/13 1405      Chief Complaint  Patient presents with  . Shoulder Pain    (Consider location/radiation/quality/duration/timing/severity/associated sxs/prior treatment) The history is provided by the patient.   patient reports left shoulder pain for 2-3 months.  Her pain is worse with range of motion of her left shoulder.  She has not spoke with her primary care physician about this.  She finally came today because her pain has become more severe.  No fevers or chills.  No redness or rash noted by the patient.  The only thing that hurts her shoulders movement.  Her shoulder pain is improved by not moving it.  She tends to hold her left wrist with her right hand in an effort to keep her shoulder still.  She cannot raise her left arm over her head.  Past Medical History  Diagnosis Date  . Cancer     left lung/ 2009/ surg only  . Hypertension   . Arthritis   . Diverticulitis   . Diabetes mellitus   . H/O ventral hernia   . Lung cancer     Past Surgical History  Procedure Date  . Cholecystectomy   . Ectopic pregnancy surgery   . Abdominal hysterectomy   . Lung cancer surgery     Family History  Problem Relation Age of Onset  . Diabetes Mother   . Hypertension Mother   . Diabetes Father     History  Substance Use Topics  . Smoking status: Former Games developer  . Smokeless tobacco: Not on file  . Alcohol Use: No    OB History    Grav Para Term Preterm Abortions TAB SAB Ect Mult Living                  Review of Systems  All other systems reviewed and are negative.    Allergies  Review of patient's allergies indicates no known allergies.  Home Medications   Current Outpatient Rx  Name  Route  Sig  Dispense  Refill  . ALBUTEROL SULFATE HFA 108 (90 BASE) MCG/ACT IN AERS   Inhalation   Inhale 2 puffs into the lungs every 6 (six) hours as needed. Shortness  of Breath         . ASPIRIN EC 81 MG PO TBEC   Oral   Take 81 mg by mouth daily.         Marland Kitchen BENAZEPRIL-HYDROCHLOROTHIAZIDE 20-12.5 MG PO TABS   Oral   Take 0.5 tablets by mouth daily.          . BUPROPION HCL ER (SR) 150 MG PO TB12   Oral   Take 150 mg by mouth 2 (two) times daily.         Marland Kitchen CALCIUM 600 + D PO   Oral   Take 1 tablet by mouth daily.         . B-12 PO   Oral   Take 1 tablet by mouth daily.         . DENOSUMAB 60 MG/ML [place] SOLN   Subcutaneous   Inject 60 mg into the skin every 6 (six) months. Administer in upper arm, thigh, or abdomen         . OMEGA-3 FATTY ACIDS 1000 MG PO CAPS   Oral   Take 1 g by mouth daily.         Marland Kitchen  GABAPENTIN 300 MG PO CAPS   Oral   Take 300 mg by mouth daily.          Marland Kitchen GARLIC PO TABS   Oral   Take 1 tablet by mouth daily.         Marland Kitchen MECLIZINE HCL 25 MG PO TABS   Oral   Take 25 mg by mouth 4 (four) times daily as needed. Dizziness         . OMEPRAZOLE 20 MG PO CPDR   Oral   Take 20 mg by mouth daily.         . TRADJENTA 5 MG PO TABS   Oral   Take 5 mg by mouth daily.          Marland Kitchen HYDROCODONE-ACETAMINOPHEN 5-325 MG PO TABS   Oral   Take 1 tablet by mouth every 4 (four) hours as needed for pain.   20 tablet   0   . IBUPROFEN 600 MG PO TABS   Oral   Take 1 tablet (600 mg total) by mouth every 8 (eight) hours as needed for pain.   15 tablet   0     BP 141/58  Pulse 88  Temp 97.9 F (36.6 C) (Oral)  Resp 20  Ht 5\' 4"  (1.626 m)  Wt 159 lb (72.122 kg)  BMI 27.29 kg/m2  SpO2 95%  Physical Exam  Nursing note and vitals reviewed. Constitutional: She is oriented to person, place, and time. She appears well-developed and well-nourished. No distress.  HENT:  Head: Normocephalic and atraumatic.  Eyes: EOM are normal.  Neck: Normal range of motion.  Cardiovascular: Normal rate, regular rhythm and normal heart sounds.   Pulmonary/Chest: Effort normal and breath sounds normal.  Abdominal:  Soft. She exhibits no distension. There is no tenderness.  Musculoskeletal:       Pain with range of motion of left shoulder.  Normal left radial pulse.  No obvious deformity noted of left shoulder appear  Neurological: She is alert and oriented to person, place, and time.  Skin: Skin is warm and dry.  Psychiatric: She has a normal mood and affect. Judgment normal.    ED Course  Procedures (including critical care time)  Labs Reviewed - No data to display Dg Shoulder Left  01/08/2013  *RADIOLOGY REPORT*  Clinical Data: Left-sided shoulder pain, no known injury  LEFT SHOULDER - 2+ VIEW  Comparison: Chest radiograph - 12/03/2012; CT abdomen pelvis - 04/19/2012  Findings: No fracture or dislocation. The left glenohumeral joint spaces are preserved.  There is minimal degenerative change of the left acromioclavicular joint with joint space loss, subchondral sclerosis and minimal inferiorly-directed osteophytosis.  The humeral head appears slightly high-riding.  No evidence of calcific tendonitis.  Limited visualization of adjacent thorax suggests possible old/healed left-sided rib fractures.  There is mild diffuse thickening of the pulmonary interstitium without focal airspace opacity. Atherosclerotic calcifications of the thoracic aorta.  The regional soft tissues are normal.   IMPRESSION:  1.  No acute findings. 2.  Slightly high riding appearance of the humeral head suggesting rotator cuff pathology. 3.  Mild degenerative change at the [facility] joint.   Original Report Authenticated By: Tacey Ruiz, MD    I personally reviewed the imaging tests through PACS system I reviewed available ER/hospitalization records through the EMR   1. Left shoulder pain       MDM  Likely left shoulder arthritis.  Orthopedic followup.  Sling for comfort.  Pain medicine.  Nothing to suggest septic joint.        Lyanne Co, MD 01/08/13 1455

## 2013-01-08 NOTE — ED Notes (Signed)
Pt states left shoulder pain x 2-3 weeks. States pain is worse with movement.

## 2013-02-09 ENCOUNTER — Encounter: Payer: Self-pay | Admitting: Orthopedic Surgery

## 2013-02-09 ENCOUNTER — Ambulatory Visit (INDEPENDENT_AMBULATORY_CARE_PROVIDER_SITE_OTHER): Payer: Medicare Other | Admitting: Orthopedic Surgery

## 2013-02-09 VITALS — BP 130/60 | Ht 64.0 in | Wt 172.0 lb

## 2013-02-09 DIAGNOSIS — M67919 Unspecified disorder of synovium and tendon, unspecified shoulder: Secondary | ICD-10-CM

## 2013-02-09 DIAGNOSIS — M7552 Bursitis of left shoulder: Secondary | ICD-10-CM | POA: Insufficient documentation

## 2013-02-09 MED ORDER — HYDROCODONE-ACETAMINOPHEN 5-325 MG PO TABS: 1.0000 | ORAL_TABLET | ORAL | Status: DC | PRN

## 2013-02-09 NOTE — Progress Notes (Signed)
Patient ID: Angela Lawson, female   DOB: [birth year], 79 y.o.   MRN: [id number] Chief Complaint  Patient presents with  . Shoulder Pain    Left shoulder pain, no injury Referred by Dr. Felecia Shelling    History 79 year old female gradual onset of stabbing pain in the left shoulder requiring ER visit. She was started on Vicodin which helped. She complains of 8/10 intermittent pain associated with for elevation and catching she denies trauma  Review of systems shortness of breath and unsteady gait with some anxiety  Past Medical History  Diagnosis Date  . Cancer     left lung/ 2009/ surg only  . Hypertension   . Arthritis   . Diverticulitis   . Diabetes mellitus   . H/O ventral hernia   . Lung cancer   . COPD (chronic obstructive pulmonary disease)   . Osteoporosis     Physical examination BP 130/60  Ht 5\' 4"  (1.626 m)  Wt 172 lb (78.019 kg)  BMI 29.51 kg/m2 Gen. she's well-developed and well-nourished. Her grooming and hygiene are normal there are no gross deformities. She is oriented x3. Her mood and affect are normal. Her ambulation is slowed but no limp  Right shoulder full range of motion no tenderness no instability normal motor exam skin is intact  Left shoulder she is tender around the shoulder acromion anterior shoulder joint and posterior joint line. She exhibits only 90 of forward elevation. Stability test normal motor grade 5 internal and external rotation. Secondary to motion loss cannot test strength of the supraspinatus. Passive flexion 120 with extreme pain. Skin intact. Pulses good and the rest sensation good in both arms  X-rays were done at the hospital shows a type III acromion. She has a slightly high riding humeral head which can indicate rotator cuff weakness or chronic tear  No acute fracture  Impression Bursitis of left shoulder - Plan: HYDROcodone-acetaminophen (NORCO/VICODIN) 5-325 MG per tablet   Plan injection, continue Norco. Followup as  needed  Subacromial Shoulder Injection Procedure Note  Pre-operative Diagnosis: left RC Syndrome  Post-operative Diagnosis: same  Indications: pain   Anesthesia: ethyl chloride   Procedure Details   Verbal consent was obtained for the procedure. The shoulder was prepped withalcohol and the skin was anesthetized. A 20 gauge needle was advanced into the subacromial space through posterior approach without difficulty  The space was then injected with 3 ml 1% lidocaine and 1 ml of depomedrol. The injection site was cleansed with isopropyl alcohol and a dressing was applied.  Complications:  None; patient tolerated the procedure well.

## 2013-02-09 NOTE — Patient Instructions (Signed)
You have received a steroid shot. 15% of patients experience increased pain at the injection site with in the next 24 hours. This is best treated with ice and tylenol extra strength 2 tabs every 8 hours. If you are still having pain please call the office.   Impingement Syndrome, Rotator Cuff, Bursitis with Rehab Impingement syndrome is a condition that involves inflammation of the tendons of the rotator cuff and the subacromial bursa, that causes pain in the shoulder. The rotator cuff consists of four tendons and muscles that control much of the shoulder and upper arm function. The subacromial bursa is a fluid filled sac that helps reduce friction between the rotator cuff and one of the bones of the shoulder (acromion). Impingement syndrome is usually an overuse injury that causes swelling of the bursa (bursitis), swelling of the tendon (tendonitis), and/or a tear of the tendon (strain). Strains are classified into three categories. Grade 1 strains cause pain, but the tendon is not lengthened. Grade 2 strains include a lengthened ligament, due to the ligament being stretched or partially ruptured. With grade 2 strains there is still function, although the function may be decreased. Grade 3 strains include a complete tear of the tendon or muscle, and function is usually impaired. SYMPTOMS    Pain around the shoulder, often at the outer portion of the upper arm.   Pain that gets worse with shoulder function, especially when reaching overhead or lifting.   Sometimes, aching when not using the arm.   Pain that wakes you up at night.   Sometimes, tenderness, swelling, warmth, or redness over the affected area.   Loss of strength.   Limited motion of the shoulder, especially reaching behind the back (to the back pocket or to unhook bra) or across your body.   Crackling sound (crepitation) when moving the arm.   Biceps tendon pain and inflammation (in the front of the shoulder). Worse when bending  the elbow or lifting.  CAUSES   Impingement syndrome is often an overuse injury, in which chronic (repetitive) motions cause the tendons or bursa to become inflamed. A strain occurs when a force is paced on the tendon or muscle that is greater than it can withstand. Common mechanisms of injury include: Stress from sudden increase in duration, frequency, or intensity of training.  Direct hit (trauma) to the shoulder.   Aging, erosion of the tendon with normal use.   Bony bump on shoulder (acromial spur).  RISK INCREASES WITH:  Contact sports (football, wrestling, boxing).   Throwing sports (baseball, tennis, volleyball).   Weightlifting and bodybuilding.   Heavy labor.   Previous injury to the rotator cuff, including impingement.   Poor shoulder strength and flexibility.   Failure to warm up properly before activity.   Inadequate protective equipment.   Old age.   Bony bump on shoulder (acromial spur).  PREVENTION    Warm up and stretch properly before activity.   Allow for adequate recovery between workouts.   Maintain physical fitness:   Strength, flexibility, and endurance.   Cardiovascular fitness.   Learn and use proper exercise technique.  PROGNOSIS   If treated properly, impingement syndrome usually goes away within 6 weeks. Sometimes surgery is required.   RELATED COMPLICATIONS    Longer healing time if not properly treated, or if not given enough time to heal.   Recurring symptoms, that result in a chronic condition.   Shoulder stiffness, frozen shoulder, or loss of motion.   Rotator cuff tendon tear.     Recurring symptoms, especially if activity is resumed too soon, with overuse, with a direct blow, or when using poor technique.  TREATMENT   Treatment first involves the use of ice and medicine, to reduce pain and inflammation. The use of strengthening and stretching exercises may help reduce pain with activity. These exercises may be performed at home  or with a therapist. If non-surgical treatment is unsuccessful after more than 6 months, surgery may be advised. After surgery and rehabilitation, activity is usually possible in 3 months.   MEDICATION  If pain medicine is needed, nonsteroidal anti-inflammatory medicines (aspirin and ibuprofen), or other minor pain relievers (acetaminophen), are often advised.   Do not take pain medicine for 7 days before surgery.   Prescription pain relievers may be given, if your caregiver thinks they are needed. Use only as directed and only as much as you need.   Corticosteroid injections may be given by your caregiver. These injections should be reserved for the most serious cases, because they may only be given a certain number of times.  HEAT AND COLD  Cold treatment (icing) should be applied for 10 to 15 minutes every 2 to 3 hours for inflammation and pain, and immediately after activity that aggravates your symptoms. Use ice packs or an ice massage.   Heat treatment may be used before performing stretching and strengthening activities prescribed by your caregiver, physical therapist, or athletic trainer. Use a heat pack or a warm water soak.  SEEK MEDICAL CARE IF:    Symptoms get worse or do not improve in 4 to 6 weeks, despite treatment.   New, unexplained symptoms develop. (Drugs used in treatment may produce side effects.)   

## 2013-02-15 ENCOUNTER — Ambulatory Visit (HOSPITAL_COMMUNITY): Payer: Medicare Other

## 2013-02-15 ENCOUNTER — Other Ambulatory Visit (HOSPITAL_COMMUNITY): Payer: Medicare Other

## 2013-02-16 ENCOUNTER — Ambulatory Visit (HOSPITAL_COMMUNITY): Payer: Medicare Other | Admitting: Oncology

## 2013-02-18 ENCOUNTER — Ambulatory Visit (HOSPITAL_COMMUNITY)
Admission: RE | Admit: 2013-02-18 | Discharge: 2013-02-18 | Disposition: A | Discharge status: Home / Self Care | Payer: Medicare Other | Source: Ambulatory Visit | Attending: Internal Medicine | Admitting: Internal Medicine

## 2013-02-18 ENCOUNTER — Encounter (HOSPITAL_COMMUNITY): Payer: Medicare Other | Attending: Oncology

## 2013-02-18 DIAGNOSIS — Z85118 Personal history of other malignant neoplasm of bronchus and lung: Secondary | ICD-10-CM | POA: Insufficient documentation

## 2013-02-18 DIAGNOSIS — J449 Chronic obstructive pulmonary disease, unspecified: Secondary | ICD-10-CM | POA: Insufficient documentation

## 2013-02-18 DIAGNOSIS — J4489 Other specified chronic obstructive pulmonary disease: Secondary | ICD-10-CM | POA: Insufficient documentation

## 2013-02-18 DIAGNOSIS — Z09 Encounter for follow-up examination after completed treatment for conditions other than malignant neoplasm: Secondary | ICD-10-CM | POA: Insufficient documentation

## 2013-02-18 DIAGNOSIS — C3490 Malignant neoplasm of unspecified part of unspecified bronchus or lung: Secondary | ICD-10-CM

## 2013-02-18 DIAGNOSIS — R918 Other nonspecific abnormal finding of lung field: Secondary | ICD-10-CM | POA: Insufficient documentation

## 2013-02-18 DIAGNOSIS — I10 Essential (primary) hypertension: Secondary | ICD-10-CM | POA: Insufficient documentation

## 2013-02-18 DIAGNOSIS — E119 Type 2 diabetes mellitus without complications: Secondary | ICD-10-CM | POA: Insufficient documentation

## 2013-02-18 DIAGNOSIS — R911 Solitary pulmonary nodule: Secondary | ICD-10-CM | POA: Insufficient documentation

## 2013-02-18 DIAGNOSIS — Z902 Acquired absence of lung [part of]: Secondary | ICD-10-CM | POA: Insufficient documentation

## 2013-02-18 LAB — COMPREHENSIVE METABOLIC PANEL
ALT: 15 U/L (ref 0–35)
AST: 17 U/L (ref 0–37)
Albumin: 3.8 g/dL (ref 3.5–5.2)
Alkaline Phosphatase: 67 U/L (ref 39–117)
BUN: 22 mg/dL (ref 6–23)
CO2: 28 mEq/L (ref 19–32)
Calcium: 9.9 mg/dL (ref 8.4–10.5)
Chloride: 101 mEq/L (ref 96–112)
Creatinine, Ser: 1.34 mg/dL — ABNORMAL HIGH (ref 0.50–1.10)
GFR calc Af Amer: 43 mL/min — ABNORMAL LOW (ref >90)
GFR calc non Af Amer: 37 mL/min — ABNORMAL LOW (ref >90)
Glucose, Bld: 139 mg/dL — ABNORMAL HIGH (ref 70–99)
Potassium: 4.6 mEq/L (ref 3.5–5.1)
Sodium: 138 mEq/L (ref 135–145)
Total Bilirubin: 0.3 mg/dL (ref 0.3–1.2)
Total Protein: 7.5 g/dL (ref 6.0–8.3)

## 2013-02-18 LAB — CBC
HCT: 44.8 % (ref 36.0–46.0)
Hemoglobin: 14.5 g/dL (ref 12.0–15.0)
MCH: 30.1 pg (ref 26.0–34.0)
MCHC: 32.4 g/dL (ref 30.0–36.0)
MCV: 92.9 fL (ref 78.0–100.0)
Platelets: 222 10*3/uL (ref 150–400)
RBC: 4.82 MIL/uL (ref 3.87–5.11)
RDW: 14.9 % (ref 11.5–15.5)
WBC: 7.5 10*3/uL (ref 4.0–10.5)

## 2013-02-18 NOTE — Progress Notes (Signed)
Labs drawn today for cbc,cmp 

## 2013-02-23 ENCOUNTER — Encounter (HOSPITAL_COMMUNITY): Payer: Self-pay | Admitting: Oncology

## 2013-02-23 ENCOUNTER — Encounter (HOSPITAL_BASED_OUTPATIENT_CLINIC_OR_DEPARTMENT_OTHER): Payer: Medicare Other | Admitting: Oncology

## 2013-02-23 VITALS — BP 132/93 | HR 89 | Temp 97.9°F | Resp 20 | Wt 172.0 lb

## 2013-02-23 DIAGNOSIS — N289 Disorder of kidney and ureter, unspecified: Secondary | ICD-10-CM

## 2013-02-23 DIAGNOSIS — J449 Chronic obstructive pulmonary disease, unspecified: Secondary | ICD-10-CM

## 2013-02-23 DIAGNOSIS — C3490 Malignant neoplasm of unspecified part of unspecified bronchus or lung: Secondary | ICD-10-CM

## 2013-02-23 DIAGNOSIS — C3430 Malignant neoplasm of lower lobe, unspecified bronchus or lung: Secondary | ICD-10-CM

## 2013-02-23 DIAGNOSIS — R911 Solitary pulmonary nodule: Secondary | ICD-10-CM

## 2013-02-23 NOTE — Progress Notes (Signed)
Problem number 1 stage IA, non-small cell lung cancer consistent with poorly differentiated adenocarcinoma status post resection 07/07/2008 in [place]. She saw Dr. Claude Manges in followup. Thus far she had no evidence for recurrent disease. #2 COPD secondary to do long-standing smoking history though she quit in 2009. She smoked at least a pack a day for 30+ years. #3 hypertension #4 osteopenia/osteoporosis according to her history #5 diabetes mellitus #6 mild renal insufficiency for which she sees Dr. Kristian Covey #7 small 1 inch umbilical hernia asymptomatic #8 left shoulder decreased range of motion with discomfort being evaluated by Dr. Romeo Apple  She is here today with her one daughter. She is doing well from the breathing standpoint she states. She no longer smoking.  She has a negative oncology review of systems.  BP 132/93  Pulse 89  Temp(Src) 97.9 F (36.6 C) (Oral)  Resp 20  Wt 172 lb (78.019 kg)  BMI 29.51 kg/m2  She has no lymphadenopathy in the cervical, supraclavicular, infraclavicular, axillary, epitrochlear, or inguinal areas. She has no arm or leg edema. Her lungs are clear but with markedly diminished breath sounds. She is hyperresonant to percussion. Left posterior lateral scar is well healed on the chest. Breast exam is negative for masses. She had a scar the right upper-outer quadrant of the right breast where she had benign finding she states that is well-healed as well. Heart shows a regular rhythm and rate without murmur rub or gallop. She has decreased range of motion with her left shoulder joint. She has no arm edema no leg edema. Abdomen is soft and nontender without hepatosplenomegaly. Bowel sounds are normal. There is this umbilical hernia approximate 2 cm to 1 inch across easily reducible.  She is alert and oriented. Facial symmetry is intact. She is right handed.  She does need a followup CAT scan in 3 months because of the recent findings of a nodule on  the right worrisome for possible new disease.  Will see her right after the CAT scan is done in June.

## 2013-02-23 NOTE — Patient Instructions (Addendum)
.  [facility] Discharge Instructions  RECOMMENDATIONS MADE BY THE CONSULTANT AND ANY TEST RESULTS WILL BE SENT TO YOUR REFERRING PHYSICIAN.  EXAM FINDINGS BY THE PHYSICIAN TODAY AND SIGNS OR SYMPTOMS TO REPORT TO CLINIC OR PRIMARY PHYSICIAN: Exam per Dr. Mariel Sleet   Your labs were great! We can do your prolia injection here in this department, we just need an order from Dr. Fransico Him and his office will need to call our scheduler to set it up.  SPECIAL INSTRUCTIONS/FOLLOW-UP: Repeat ct scans in 3 months See Dr. Mariel Sleet after scans.  Thank you for choosing [facility] to provide your oncology and hematology care.  To afford each patient quality time with our providers, please arrive at least 15 minutes before your scheduled appointment time.  With your help, our goal is to use those 15 minutes to complete the necessary work-up to ensure our physicians have the information they need to help with your evaluation and healthcare recommendations.    Effective January 1st, 2014, we ask that you re-schedule your appointment with our physicians should you arrive 10 or more minutes late for your appointment.  We strive to give you quality time with our providers, and arriving late affects you and other patients whose appointments are after yours.    Again, thank you for choosing [facility].  Our hope is that these requests will decrease the amount of time that you wait before being seen by our physicians.       _____________________________________________________________  Should you have questions after your visit to [facility], please contact our office at [phone] between the hours of 8:30 a.m. and 5:00 p.m.  Voicemails left after 4:30 p.m. will not be returned until the following business day.  For prescription refill requests, have your pharmacy contact our office with your prescription refill request.

## 2013-03-03 ENCOUNTER — Ambulatory Visit (HOSPITAL_COMMUNITY): Payer: Medicare Other

## 2013-03-04 ENCOUNTER — Encounter (HOSPITAL_COMMUNITY): Payer: Medicare Other

## 2013-03-04 VITALS — BP 113/67 | HR 95 | Temp 98.1°F | Resp 20

## 2013-03-04 DIAGNOSIS — M810 Age-related osteoporosis without current pathological fracture: Secondary | ICD-10-CM

## 2013-03-04 MED ORDER — DENOSUMAB 60 MG/ML ~~LOC~~ SOLN
60.0000 mg | Freq: Once | SUBCUTANEOUS | Status: AC
  Administered 2013-03-04: 60 mg via SUBCUTANEOUS
  Filled 2013-03-04: qty 1

## 2013-03-04 NOTE — Progress Notes (Signed)
Angela Lawson presents today for injection per MD orders. prolia 60 mg administered SQ in right Upper Arm. Administration without incident. Patient tolerated well.

## 2013-05-26 ENCOUNTER — Ambulatory Visit (HOSPITAL_COMMUNITY)
Admission: RE | Admit: 2013-05-26 | Discharge: 2013-05-26 | Disposition: A | Discharge status: Home / Self Care | Payer: Medicare Other | Source: Ambulatory Visit | Attending: Oncology | Admitting: Oncology

## 2013-05-26 ENCOUNTER — Encounter (HOSPITAL_COMMUNITY): Payer: Medicare Other | Attending: Oncology

## 2013-05-26 DIAGNOSIS — C3490 Malignant neoplasm of unspecified part of unspecified bronchus or lung: Secondary | ICD-10-CM

## 2013-05-26 DIAGNOSIS — I10 Essential (primary) hypertension: Secondary | ICD-10-CM | POA: Insufficient documentation

## 2013-05-26 DIAGNOSIS — C3430 Malignant neoplasm of lower lobe, unspecified bronchus or lung: Secondary | ICD-10-CM

## 2013-05-26 DIAGNOSIS — J4489 Other specified chronic obstructive pulmonary disease: Secondary | ICD-10-CM | POA: Insufficient documentation

## 2013-05-26 DIAGNOSIS — J449 Chronic obstructive pulmonary disease, unspecified: Secondary | ICD-10-CM | POA: Insufficient documentation

## 2013-05-26 DIAGNOSIS — Z09 Encounter for follow-up examination after completed treatment for conditions other than malignant neoplasm: Secondary | ICD-10-CM | POA: Insufficient documentation

## 2013-05-26 DIAGNOSIS — Z85118 Personal history of other malignant neoplasm of bronchus and lung: Secondary | ICD-10-CM | POA: Insufficient documentation

## 2013-05-26 DIAGNOSIS — K429 Umbilical hernia without obstruction or gangrene: Secondary | ICD-10-CM | POA: Insufficient documentation

## 2013-05-26 DIAGNOSIS — M899 Disorder of bone, unspecified: Secondary | ICD-10-CM | POA: Insufficient documentation

## 2013-05-26 DIAGNOSIS — E119 Type 2 diabetes mellitus without complications: Secondary | ICD-10-CM | POA: Insufficient documentation

## 2013-05-26 DIAGNOSIS — R918 Other nonspecific abnormal finding of lung field: Secondary | ICD-10-CM | POA: Insufficient documentation

## 2013-05-26 LAB — CBC WITH DIFFERENTIAL/PLATELET
Basophils Absolute: 0 10*3/uL (ref 0.0–0.1)
Basophils Relative: 0 % (ref 0–1)
Eosinophils Absolute: 0.5 10*3/uL (ref 0.0–0.7)
Eosinophils Relative: 6 % — ABNORMAL HIGH (ref 0–5)
HCT: 42.9 % (ref 36.0–46.0)
Hemoglobin: 13.8 g/dL (ref 12.0–15.0)
Lymphocytes Relative: 26 % (ref 12–46)
Lymphs Abs: 1.9 10*3/uL (ref 0.7–4.0)
MCH: 30.1 pg (ref 26.0–34.0)
MCHC: 32.2 g/dL (ref 30.0–36.0)
MCV: 93.5 fL (ref 78.0–100.0)
Monocytes Absolute: 0.9 10*3/uL (ref 0.1–1.0)
Monocytes Relative: 12 % (ref 3–12)
Neutro Abs: 4.2 10*3/uL (ref 1.7–7.7)
Neutrophils Relative %: 56 % (ref 43–77)
Platelets: 220 10*3/uL (ref 150–400)
RBC: 4.59 MIL/uL (ref 3.87–5.11)
RDW: 14.7 % (ref 11.5–15.5)
WBC: 7.5 10*3/uL (ref 4.0–10.5)

## 2013-05-26 LAB — COMPREHENSIVE METABOLIC PANEL
ALT: 14 U/L (ref 0–35)
AST: 18 U/L (ref 0–37)
Albumin: 3.7 g/dL (ref 3.5–5.2)
Alkaline Phosphatase: 54 U/L (ref 39–117)
BUN: 19 mg/dL (ref 6–23)
CO2: 28 mEq/L (ref 19–32)
Calcium: 9.2 mg/dL (ref 8.4–10.5)
Chloride: 102 mEq/L (ref 96–112)
Creatinine, Ser: 1.29 mg/dL — ABNORMAL HIGH (ref 0.50–1.10)
GFR calc Af Amer: 45 mL/min — ABNORMAL LOW (ref >90)
GFR calc non Af Amer: 39 mL/min — ABNORMAL LOW (ref >90)
Glucose, Bld: 123 mg/dL — ABNORMAL HIGH (ref 70–99)
Potassium: 4.4 mEq/L (ref 3.5–5.1)
Sodium: 139 mEq/L (ref 135–145)
Total Bilirubin: 0.2 mg/dL — ABNORMAL LOW (ref 0.3–1.2)
Total Protein: 7.5 g/dL (ref 6.0–8.3)

## 2013-05-26 NOTE — Progress Notes (Signed)
Labs drawn today for cbc/diff,cmp 

## 2013-05-30 ENCOUNTER — Encounter (HOSPITAL_BASED_OUTPATIENT_CLINIC_OR_DEPARTMENT_OTHER): Payer: Medicare Other | Admitting: Oncology

## 2013-05-30 VITALS — BP 131/73 | HR 82 | Resp 16 | Wt 167.1 lb

## 2013-05-30 DIAGNOSIS — C3490 Malignant neoplasm of unspecified part of unspecified bronchus or lung: Secondary | ICD-10-CM

## 2013-05-30 DIAGNOSIS — Z85118 Personal history of other malignant neoplasm of bronchus and lung: Secondary | ICD-10-CM

## 2013-05-30 DIAGNOSIS — R911 Solitary pulmonary nodule: Secondary | ICD-10-CM

## 2013-05-30 NOTE — Patient Instructions (Signed)
[facility] Discharge Instructions  RECOMMENDATIONS MADE BY THE CONSULTANT AND ANY TEST RESULTS WILL BE SENT TO YOUR REFERRING PHYSICIAN.  EXAM FINDINGS BY THE PHYSICIAN TODAY AND SIGNS OR SYMPTOMS TO REPORT TO CLINIC OR PRIMARY PHYSICIAN: Your exam today was fine.  SPECIAL INSTRUCTIONS/FOLLOW-UP:  Return to see [place] in 9 months, we will schedule you for labs and a scan right before your appointment and will discuss results at your appointment time.  Please see the front desk as you leave for your appointments.    Thank you for choosing [facility] to provide your oncology and hematology care.  To afford each patient quality time with our providers, please arrive at least 15 minutes before your scheduled appointment time.  With your help, our goal is to use those 15 minutes to complete the necessary work-up to ensure our physicians have the information they need to help with your evaluation and healthcare recommendations.    Effective January 1st, 2014, we ask that you re-schedule your appointment with our physicians should you arrive 10 or more minutes late for your appointment.  We strive to give you quality time with our providers, and arriving late affects you and other patients whose appointments are after yours.    Again, thank you for choosing [facility].  Our hope is that these requests will decrease the amount of time that you wait before being seen by our physicians.       _____________________________________________________________  Should you have questions after your visit to [facility], please contact our office at [phone] between the hours of 8:30 a.m. and 5:00 p.m.  Voicemails left after 4:30 p.m. will not be returned until the following business day.  For prescription refill requests, have your pharmacy contact our office with your prescription refill request.

## 2013-05-30 NOTE — Progress Notes (Signed)
#  1 stage I A., non-small cell lung cancer, consistent with a poorly differentiated adenocarcinoma, status post resection 07/07/2008 in [place]. Thus far she has no evidence for recurrent disease with a negative CAT scan of the chest done just the other day. She still needs followup for the pulmonary nodule however in 9 months according to the radiology reference guide and then followup once a year.  She stopped smoking many years ago but remains somewhat short of breath and high has some element of asthma. #2 COPD #3 diabetes mellitus #4 hypertension #5 osteopenia by history #6 mild renal insufficiency which is stable #7 umbilical hernia, asymptomatic #8 left shoulder decreased range of motion  She is here for followup and I went over her CAT scan with her which is stable. A recommendation for CT scan is done for 9 months. After that she can have one in a year. Her lungs show a little bit of a wheeze still specially in the right upper lobe. She is still using her inhalers etc. She has no adenopathy in her neck pursue clavicular fossa. Vital signs otherwise are stable. She remains overweight for her height.  We'll see her back after the CAT scan in 9 months.

## 2013-08-23 ENCOUNTER — Encounter (HOSPITAL_COMMUNITY): Payer: Self-pay | Admitting: Pharmacy Technician

## 2013-08-23 NOTE — H&P (Signed)
NTS SOAP Note  Vital Signs:  Vitals as of: 08/23/2013: Systolic 134: Diastolic 62: Heart Rate 96: Temp 98.30F: Height 101ft 4in: Weight 168Lbs 0 Ounces: Pain Level 7: BMI 28.84  BMI : 28.84 kg/m2  Subjective: This 79 Years 0 Months old Female presents for of    HERNIA: ,Has had a hernia around her umbilicus for some time now.  Seems to be getting larger and causing her discomfort.  Review of Symptoms:  Constitutional:  fatigue Head:unremarkable    Eyes:unremarkable   Nose/Mouth/Throat:unremarkable Cardiovascular:  unremarkable   Respiratory:  dyspnea,wheezing,cough Gastrointestinal:  unremarkable   Genitourinary:unremarkable       joint pain Skin:unremarkable Hematolgic/Lymphatic:unremarkable     Allergic/Immunologic:unremarkable     Past Medical History:    Reviewed   Past Medical History  Surgical History: lung surgery 2009, cholecystectomy, TAH Medical Problems: asthma, reflux, HTN Allergies: nkda Medications: bupropion, gabapentin, prolia, proair, tradjenta omeprazole, meclizine, baby asa, benazepril, fish oil, anoro inhaler   Social History:Reviewed  Social History  Preferred Language: English Race:  Black or African American Ethnicity: Not Hispanic / Latino Age: 79 Years 0 Months Marital Status:  S Alcohol:  No Recreational drug(s):  No   Smoking Status: Never smoker reviewed on 08/23/2013 Functional Status reviewed on mm/dd/yyyy ------------------------------------------------ Bathing: Normal Cooking: Normal Dressing: Normal Driving: Normal Eating: Normal Managing Meds: Normal Oral Care: Normal Shopping: Normal Toileting: Normal Transferring: Normal Walking: Normal Cognitive Status reviewed on mm/dd/yyyy ------------------------------------------------ Attention: Normal Decision Making: Normal Language: Normal Memory: Normal Motor: Normal Perception: Normal Problem Solving: Normal Visual and Spatial:  Normal   Family History:  Reviewed  Family Health History Mother, Deceased; Healthy; healthy Father, Deceased; Healthy; healthy    Objective Information: General:  Well appearing, well nourished in no distress. Heart:  RRR, no murmur Lungs:    CTA bilaterally, no wheezes, rhonchi, rales.  Breathing unlabored. Abdomen:Soft, NT/ND, no HSM, no masses.  Umbilical hermia present, superior aspect of lower midline incision.  Reducible.  Assessment:Incisional hernia  Diagnosis &amp; Procedure Smart Code   Plan:Scheduled for incisional herniorrhaphy with mesh on 08/26/13.   Patient Education:Alternative treatments to surgery were discussed with patient (and family).  Risks and benefits  of procedure including bleeding, infection, recurrence, and pulmonary difficulties were fully explained to the patient (and family) who gave informed consent. Patient/family questions were addressed.  Follow-up:Pending Surgery

## 2013-08-24 ENCOUNTER — Encounter (HOSPITAL_COMMUNITY)
Admission: RE | Admit: 2013-08-24 | Discharge: 2013-08-24 | Disposition: A | Discharge status: Home / Self Care | Payer: Medicare Other | Source: Ambulatory Visit | Attending: General Surgery | Admitting: General Surgery

## 2013-08-24 ENCOUNTER — Encounter (HOSPITAL_COMMUNITY): Payer: Self-pay

## 2013-08-24 LAB — CBC WITH DIFFERENTIAL/PLATELET
Basophils Absolute: 0 10*3/uL (ref 0.0–0.1)
Basophils Relative: 0 % (ref 0–1)
Eosinophils Absolute: 0.4 10*3/uL (ref 0.0–0.7)
Eosinophils Relative: 6 % — ABNORMAL HIGH (ref 0–5)
HCT: 41.6 % (ref 36.0–46.0)
Hemoglobin: 13.8 g/dL (ref 12.0–15.0)
Lymphocytes Relative: 33 % (ref 12–46)
Lymphs Abs: 2.4 10*3/uL (ref 0.7–4.0)
MCH: 31.2 pg (ref 26.0–34.0)
MCHC: 33.2 g/dL (ref 30.0–36.0)
MCV: 93.9 fL (ref 78.0–100.0)
Monocytes Absolute: 0.9 10*3/uL (ref 0.1–1.0)
Monocytes Relative: 13 % — ABNORMAL HIGH (ref 3–12)
Neutro Abs: 3.5 10*3/uL (ref 1.7–7.7)
Neutrophils Relative %: 48 % (ref 43–77)
Platelets: 212 10*3/uL (ref 150–400)
RBC: 4.43 MIL/uL (ref 3.87–5.11)
RDW: 13.7 % (ref 11.5–15.5)
WBC: 7.2 10*3/uL (ref 4.0–10.5)

## 2013-08-24 LAB — BASIC METABOLIC PANEL
BUN: 19 mg/dL (ref 6–23)
CO2: 29 mEq/L (ref 19–32)
Calcium: 10.2 mg/dL (ref 8.4–10.5)
Chloride: 101 mEq/L (ref 96–112)
Creatinine, Ser: 1.31 mg/dL — ABNORMAL HIGH (ref 0.50–1.10)
GFR calc Af Amer: 44 mL/min — ABNORMAL LOW (ref >90)
GFR calc non Af Amer: 38 mL/min — ABNORMAL LOW (ref >90)
Glucose, Bld: 83 mg/dL (ref 70–99)
Potassium: 4.3 mEq/L (ref 3.5–5.1)
Sodium: 137 mEq/L (ref 135–145)

## 2013-08-24 NOTE — Patient Instructions (Signed)
Angela Lawson  08/24/2013   Your procedure is scheduled on:  08/26/2013  Report to Angela Lawson at 6:15 AM.  Call this number if you have problems the morning of surgery: [phone]   Remember:   Do not eat food or drink liquids after midnight.   Take these medicines the morning of surgery with A SIP OF WATER: Lotensin, Wellbutrin, Neurontin, Omeprazole   Do not wear jewelry, make-up or nail polish.  Do not wear lotions, powders, or perfumes. You may wear deodorant.  Do not shave 48 hours prior to surgery. Men may shave face and neck.  Do not bring valuables to the hospital.  [facility] is not responsible                   for any belongings or valuables.  Contacts, dentures or bridgework may not be worn into surgery.  Leave suitcase in the car. After surgery it may be brought to your room.  For patients admitted to the hospital, checkout time is 11:00 AM the day of  discharge.   Patients discharged the day of surgery will not be allowed to drive  home.  Name and phone number of your driver: Angela Lawson Daughter  Hernia, Surgical Repair A hernia occurs when an internal organ pushes out through a weak spot in the belly (abdominal) wall muscles. Hernias commonly occur in the groin and around the navel. Hernias often can be pushed back into place (reduced). Most hernias tend to get worse over time. Problems occur when abdominal contents get stuck in the opening (incarcerated hernia). The blood supply gets cut off (strangulated hernia). This is an emergency and needs surgery. Otherwise, hernia repair can be an elective procedure. This means you can schedule this at your convenience when an emergency is not present. Because complications can occur, if you decide to repair the hernia, it is best to do it soon. When it becomes an emergency procedure, there is increased risk of complications after surgery. CAUSES   Heavy lifting.  Obesity.  Prolonged coughing.  Straining to move your  bowels.  Hernias can also occur through a cut (incision) by a surgeonafter an abdominal operation. HOME CARE INSTRUCTIONS Before the repair:  Bed rest is not required. You may continue your normal activities, but avoid heavy lifting (more than 10 pounds) or straining. Cough gently. If you are a smoker, it is best to stop. Even the best hernia repair can break down with the continual strain of coughing.  Do not wear anything tight over your hernia. Do not try to keep it in with an outside bandage or truss. These can damage abdominal contents if they are trapped in the hernia sac.  Eat a normal diet. Avoid constipation. Straining over long periods of time to have a bowel movement will increase hernia size. It also can breakdown repairs. If you cannot do this with diet alone, laxatives or stool softeners may be used. PRIOR TO SURGERY, SEEK IMMEDIATE MEDICAL CARE IF: You have problems (symptoms) of a trapped (incarcerated) hernia. Symptoms include:  An oral temperature above 102 F (38.9 C) develops, or as your caregiver suggests.  Increasing abdominal pain.  Feeling sick to your stomach(nausea) and vomiting.  You stop passing gas or stool.  The hernia is stuck outside the abdomen, looks discolored, feels hard, or is tender.  You have any changes in your bowel habits or in the hernia that is unusual for you. LET YOUR CAREGIVERS KNOW ABOUT THE FOLLOWING:  Allergies.  Medications taken including herbs, eye drops, over the counter medications, and creams.  Use of steroids (by mouth or creams).  Family or personal history of problems with anesthetics or Novocaine.  Possibility of pregnancy, if this applies.  Personal history of blood clots (thrombophlebitis).  Family or personal history of bleeding or blood problems.  Previous surgery.  Other health problems. BEFORE THE PROCEDURE You should be present 1 hour prior to your procedure, or as directed by your caregiver.  AFTER THE  PROCEDURE After surgery, you will be taken to the recovery area. A nurse will watch and check your progress there. Once you are awake, stable, and taking fluids well, you will be allowed to go home as long as there are no problems. Once home, an ice pack (wrapped in a light towel) applied to your operative site may help with discomfort. It may also keep the swelling down. Do not lift anything heavier than 10 pounds (4.55 kilograms). Take showers not baths. Do not drive while taking narcotics. Follow instructions as suggested by your caregiver.  SEEK IMMEDIATE MEDICAL CARE IF: After surgery:  There is redness, swelling, or increasing pain in the wound.  There is pus coming from the wound.  There is drainage from a wound lasting longer than 1 day.  An unexplained oral temperature above 102 F (38.9 C) develops.  You notice a foul smell coming from the wound or dressing.  There is a breaking open of a wound (edged not staying together) after the sutures have been removed.  You notice increasing pain in the shoulders (shoulder strap areas).  You develop dizzy episodes or fainting while standing.  You develop persistent nausea or vomiting.  You develop a rash.  You have difficulty breathing.  You develop any reaction or side effects to medications given. MAKE SURE YOU:   Understand these instructions.  Will watch your condition.  Will get help right away if you are not doing well or get worse. Document Released: 05/20/2001 Document Revised: 02/16/2012 Document Reviewed: 04/11/2008 [facility] Patient Information 2014 [place], [place].   Special Instructions: Shower using CHG 2 nights before surgery and the night before surgery.  If you shower the day of surgery use CHG.  Use special wash - you have one bottle of CHG for all showers.  You should use approximately 1/3 of the bottle for each shower.   Please read over the following fact sheets that you were given: Care and Recovery After  Surgery

## 2013-08-26 ENCOUNTER — Ambulatory Visit (HOSPITAL_COMMUNITY)
Admission: RE | Admit: 2013-08-26 | Discharge: 2013-08-26 | Disposition: A | Discharge status: Home / Self Care | Payer: Medicare Other | Source: Ambulatory Visit | Attending: General Surgery | Admitting: General Surgery

## 2013-08-26 ENCOUNTER — Encounter (HOSPITAL_COMMUNITY)
Admission: RE | Disposition: A | Discharge status: Home / Self Care | Payer: Self-pay | Source: Ambulatory Visit | Attending: General Surgery

## 2013-08-26 ENCOUNTER — Encounter (HOSPITAL_COMMUNITY): Payer: Self-pay | Admitting: Anesthesiology

## 2013-08-26 ENCOUNTER — Ambulatory Visit (HOSPITAL_COMMUNITY): Payer: Medicare Other | Admitting: Anesthesiology

## 2013-08-26 DIAGNOSIS — M7552 Bursitis of left shoulder: Secondary | ICD-10-CM

## 2013-08-26 DIAGNOSIS — Z01812 Encounter for preprocedural laboratory examination: Secondary | ICD-10-CM | POA: Insufficient documentation

## 2013-08-26 DIAGNOSIS — K432 Incisional hernia without obstruction or gangrene: Secondary | ICD-10-CM | POA: Insufficient documentation

## 2013-08-26 DIAGNOSIS — J449 Chronic obstructive pulmonary disease, unspecified: Secondary | ICD-10-CM | POA: Insufficient documentation

## 2013-08-26 DIAGNOSIS — E119 Type 2 diabetes mellitus without complications: Secondary | ICD-10-CM | POA: Insufficient documentation

## 2013-08-26 DIAGNOSIS — J4489 Other specified chronic obstructive pulmonary disease: Secondary | ICD-10-CM | POA: Insufficient documentation

## 2013-08-26 DIAGNOSIS — I10 Essential (primary) hypertension: Secondary | ICD-10-CM | POA: Insufficient documentation

## 2013-08-26 HISTORY — PX: INCISIONAL HERNIA REPAIR: SHX193

## 2013-08-26 LAB — GLUCOSE, CAPILLARY: Glucose-Capillary: 100 mg/dL — ABNORMAL HIGH (ref 70–99)

## 2013-08-26 SURGERY — REPAIR, HERNIA, INCISIONAL
Anesthesia: General | Site: Abdomen | Wound class: Clean

## 2013-08-26 MED ORDER — FENTANYL CITRATE 0.05 MG/ML IJ SOLN
INTRAMUSCULAR | Status: DC | PRN
  Administered 2013-08-26: 10 ug via INTRAVENOUS
  Administered 2013-08-26: 25 ug via INTRAVENOUS

## 2013-08-26 MED ORDER — ROCURONIUM BROMIDE 100 MG/10ML IV SOLN
INTRAVENOUS | Status: DC | PRN
  Administered 2013-08-26: 5 mg via INTRAVENOUS
  Administered 2013-08-26: 25 mg via INTRAVENOUS

## 2013-08-26 MED ORDER — BUPIVACAINE HCL (PF) 0.5 % IJ SOLN
INTRAMUSCULAR | Status: AC
  Filled 2013-08-26: qty 30

## 2013-08-26 MED ORDER — POVIDONE-IODINE 10 % OINT PACKET
TOPICAL_OINTMENT | CUTANEOUS | Status: DC | PRN
  Administered 2013-08-26: 1 via TOPICAL

## 2013-08-26 MED ORDER — BUPIVACAINE HCL (PF) 0.5 % IJ SOLN
INTRAMUSCULAR | Status: DC | PRN
  Administered 2013-08-26: 7 mL

## 2013-08-26 MED ORDER — NEOSTIGMINE METHYLSULFATE 1 MG/ML IJ SOLN
INTRAMUSCULAR | Status: DC | PRN
  Administered 2013-08-26: 2 mg via INTRAVENOUS

## 2013-08-26 MED ORDER — GLYCOPYRROLATE 0.2 MG/ML IJ SOLN
INTRAMUSCULAR | Status: AC
  Filled 2013-08-26: qty 2

## 2013-08-26 MED ORDER — KETOROLAC TROMETHAMINE 30 MG/ML IJ SOLN
30.0000 mg | Freq: Once | INTRAMUSCULAR | Status: AC
  Administered 2013-08-26: 30 mg via INTRAVENOUS

## 2013-08-26 MED ORDER — ONDANSETRON HCL 4 MG/2ML IJ SOLN
4.0000 mg | Freq: Once | INTRAMUSCULAR | Status: AC
  Administered 2013-08-26: 4 mg via INTRAVENOUS

## 2013-08-26 MED ORDER — LACTATED RINGERS IV SOLN
INTRAVENOUS | Status: DC
  Administered 2013-08-26: 08:00:00 via INTRAVENOUS
  Administered 2013-08-26: 1000 mL via INTRAVENOUS

## 2013-08-26 MED ORDER — ONDANSETRON HCL 4 MG/2ML IJ SOLN: 4.0000 mg | Freq: Once | INTRAMUSCULAR | Status: DC | PRN

## 2013-08-26 MED ORDER — KETOROLAC TROMETHAMINE 30 MG/ML IJ SOLN
INTRAMUSCULAR | Status: AC
  Filled 2013-08-26: qty 1

## 2013-08-26 MED ORDER — EPHEDRINE SULFATE 50 MG/ML IJ SOLN
INTRAMUSCULAR | Status: AC
  Filled 2013-08-26: qty 1

## 2013-08-26 MED ORDER — SODIUM CHLORIDE 0.9 % IR SOLN
Status: DC | PRN
  Administered 2013-08-26: 1000 mL

## 2013-08-26 MED ORDER — CHLORHEXIDINE GLUCONATE 4 % EX LIQD: 1.0000 "application " | Freq: Once | CUTANEOUS | Status: DC

## 2013-08-26 MED ORDER — LIDOCAINE HCL (PF) 1 % IJ SOLN
INTRAMUSCULAR | Status: AC
  Filled 2013-08-26: qty 5

## 2013-08-26 MED ORDER — MIDAZOLAM HCL 2 MG/2ML IJ SOLN
1.0000 mg | INTRAMUSCULAR | Status: DC | PRN
  Administered 2013-08-26 (×2): 1 mg via INTRAVENOUS

## 2013-08-26 MED ORDER — ENOXAPARIN SODIUM 40 MG/0.4ML ~~LOC~~ SOLN
40.0000 mg | Freq: Once | SUBCUTANEOUS | Status: AC
  Administered 2013-08-26: 40 mg via SUBCUTANEOUS

## 2013-08-26 MED ORDER — ENOXAPARIN SODIUM 40 MG/0.4ML ~~LOC~~ SOLN
SUBCUTANEOUS | Status: AC
  Filled 2013-08-26: qty 0.4

## 2013-08-26 MED ORDER — CEFAZOLIN SODIUM-DEXTROSE 2-3 GM-% IV SOLR
INTRAVENOUS | Status: AC
  Filled 2013-08-26: qty 50

## 2013-08-26 MED ORDER — EPHEDRINE SULFATE 50 MG/ML IJ SOLN
INTRAMUSCULAR | Status: DC | PRN
  Administered 2013-08-26: 5 mg via INTRAVENOUS

## 2013-08-26 MED ORDER — CEFAZOLIN SODIUM-DEXTROSE 2-3 GM-% IV SOLR
2.0000 g | INTRAVENOUS | Status: AC
  Administered 2013-08-26: 2 g via INTRAVENOUS

## 2013-08-26 MED ORDER — POVIDONE-IODINE 10 % EX OINT
TOPICAL_OINTMENT | CUTANEOUS | Status: AC
  Filled 2013-08-26: qty 2

## 2013-08-26 MED ORDER — GLYCOPYRROLATE 0.2 MG/ML IJ SOLN
INTRAMUSCULAR | Status: DC | PRN
  Administered 2013-08-26: 0.4 mg via INTRAVENOUS

## 2013-08-26 MED ORDER — HYDROCODONE-ACETAMINOPHEN 5-325 MG PO TABS: 1.0000 | ORAL_TABLET | ORAL | Status: DC | PRN

## 2013-08-26 MED ORDER — MIDAZOLAM HCL 2 MG/2ML IJ SOLN
INTRAMUSCULAR | Status: AC
  Filled 2013-08-26: qty 2

## 2013-08-26 MED ORDER — FENTANYL CITRATE 0.05 MG/ML IJ SOLN
INTRAMUSCULAR | Status: AC
  Filled 2013-08-26: qty 2

## 2013-08-26 MED ORDER — PROPOFOL 10 MG/ML IV EMUL
INTRAVENOUS | Status: AC
  Filled 2013-08-26: qty 20

## 2013-08-26 MED ORDER — FENTANYL CITRATE 0.05 MG/ML IJ SOLN
25.0000 ug | INTRAMUSCULAR | Status: DC | PRN
  Administered 2013-08-26: 25 ug via INTRAVENOUS

## 2013-08-26 MED ORDER — PROPOFOL 10 MG/ML IV BOLUS
INTRAVENOUS | Status: DC | PRN
  Administered 2013-08-26: 120 mg via INTRAVENOUS

## 2013-08-26 MED ORDER — ROCURONIUM BROMIDE 50 MG/5ML IV SOLN
INTRAVENOUS | Status: AC
  Filled 2013-08-26: qty 1

## 2013-08-26 MED ORDER — ONDANSETRON HCL 4 MG/2ML IJ SOLN
INTRAMUSCULAR | Status: AC
  Filled 2013-08-26: qty 2

## 2013-08-26 MED ORDER — LIDOCAINE HCL (CARDIAC) 10 MG/ML IV SOLN
INTRAVENOUS | Status: DC | PRN
  Administered 2013-08-26: 10 mg via INTRAVENOUS

## 2013-08-26 SURGICAL SUPPLY — 37 items
APPLICATOR COTTON TIP 6IN STRL (MISCELLANEOUS) ×2 IMPLANT
BAG HAMPER (MISCELLANEOUS) ×2 IMPLANT
BLADE SURG SZ11 CARB STEEL (BLADE) ×2 IMPLANT
CLOTH BEACON ORANGE TIMEOUT ST (SAFETY) ×2 IMPLANT
COVER LIGHT HANDLE STERIS (MISCELLANEOUS) ×4 IMPLANT
DECANTER SPIKE VIAL GLASS SM (MISCELLANEOUS) ×2 IMPLANT
DURAPREP 26ML APPLICATOR (WOUND CARE) ×2 IMPLANT
ELECT REM PT RETURN 9FT ADLT (ELECTROSURGICAL) ×2
ELECTRODE REM PT RTRN 9FT ADLT (ELECTROSURGICAL) ×1 IMPLANT
GLOVE BIO SURGEON STRL SZ7.5 (GLOVE) ×2 IMPLANT
GLOVE BIOGEL PI IND STRL 7.0 (GLOVE) ×1 IMPLANT
GLOVE BIOGEL PI IND STRL 7.5 (GLOVE) ×1 IMPLANT
GLOVE BIOGEL PI INDICATOR 7.0 (GLOVE) ×1
GLOVE BIOGEL PI INDICATOR 7.5 (GLOVE) ×1
GLOVE ECLIPSE 6.5 STRL STRAW (GLOVE) ×2 IMPLANT
GLOVE ECLIPSE 7.0 STRL STRAW (GLOVE) ×2 IMPLANT
GLOVE EXAM NITRILE LRG STRL (GLOVE) ×2 IMPLANT
GOWN STRL REIN XL XLG (GOWN DISPOSABLE) ×6 IMPLANT
INST SET MAJOR GENERAL (KITS) ×2 IMPLANT
KIT ROOM TURNOVER APOR (KITS) ×2 IMPLANT
MANIFOLD NEPTUNE II (INSTRUMENTS) ×2 IMPLANT
NEEDLE HYPO 25X1 1.5 SAFETY (NEEDLE) ×2 IMPLANT
NS IRRIG 1000ML POUR BTL (IV SOLUTION) ×2 IMPLANT
PACK ABDOMINAL MAJOR (CUSTOM PROCEDURE TRAY) ×2 IMPLANT
PAD ARMBOARD 7.5X6 YLW CONV (MISCELLANEOUS) ×2 IMPLANT
PATCH VENTRAL MEDIUM 6.4 (Mesh Specialty) ×2 IMPLANT
SET BASIN LINEN APH (SET/KITS/TRAYS/PACK) ×2 IMPLANT
SPONGE GAUZE 4X4 12PLY (GAUZE/BANDAGES/DRESSINGS) ×2 IMPLANT
STAPLER VISISTAT (STAPLE) ×2 IMPLANT
SUT ETHIBOND NAB MO 7 #0 18IN (SUTURE) ×2 IMPLANT
SUT VIC AB 2-0 CT1 27 (SUTURE) ×1
SUT VIC AB 2-0 CT1 TAPERPNT 27 (SUTURE) ×1 IMPLANT
SUT VIC AB 3-0 SH 27 (SUTURE) ×1
SUT VIC AB 3-0 SH 27X BRD (SUTURE) ×1 IMPLANT
SUT VIC AB 4-0 PS2 27 (SUTURE) ×2 IMPLANT
SYR CONTROL 10ML LL (SYRINGE) ×2 IMPLANT
TAPE CLOTH SURG 4X10 WHT LF (GAUZE/BANDAGES/DRESSINGS) ×2 IMPLANT

## 2013-08-26 NOTE — Anesthesia Postprocedure Evaluation (Signed)
Anesthesia Post Note  Patient: Angela Lawson  Procedure(s) Performed: Procedure(s) (LRB): HERNIA REPAIR INCISIONAL WITH MESH (N/A)  Anesthesia type: General  Patient location: PACU  Post pain: Pain level controlled  Post assessment: Post-op Vital signs reviewed, Patient's Cardiovascular Status Stable, Respiratory Function Stable, Patent Airway, No signs of Nausea or vomiting and Pain level controlled  Last Vitals:  Filed Vitals:   08/26/13 0849  BP: 134/63  Pulse: 78  Temp: 36.6 C  Resp: 12    Post vital signs: Reviewed and stable  Level of consciousness: awake and alert   Complications: No apparent anesthesia complications

## 2013-08-26 NOTE — Interval H&P Note (Signed)
History and Physical Interval Note:  08/26/2013 7:29 AM  Angela Lawson  has presented today for surgery, with the diagnosis of incisional hernia  The various methods of treatment have been discussed with the patient and family. After consideration of risks, benefits and other options for treatment, the patient has consented to  Procedure(s): HERNIA REPAIR INCISIONAL WITH MESH (N/A) as a surgical intervention .  The patient's history has been reviewed, patient examined, no change in status, stable for surgery.  I have reviewed the patient's chart and labs.  Questions were answered to the patient's satisfaction.     [name]

## 2013-08-26 NOTE — Anesthesia Procedure Notes (Signed)
Procedure Name: Intubation Date/Time: 08/26/2013 7:44 AM Performed by: Franco Nones Pre-anesthesia Checklist: Patient identified, Patient being monitored, Timeout performed, Emergency Drugs available and Suction available Patient Re-evaluated:Patient Re-evaluated prior to inductionOxygen Delivery Method: Circle System Utilized Preoxygenation: Pre-oxygenation with 100% oxygen Intubation Type: IV induction Ventilation: Mask ventilation without difficulty Laryngoscope Size: Miller and 2 Grade View: Grade I Tube type: Oral Tube size: 7.0 mm Number of attempts: 1 Airway Equipment and Method: stylet Placement Confirmation: ETT inserted through vocal cords under direct vision,  positive ETCO2 and breath sounds checked- equal and bilateral Secured at: 21 cm Tube secured with: Tape Dental Injury: Teeth and Oropharynx as per pre-operative assessment

## 2013-08-26 NOTE — Transfer of Care (Signed)
Immediate Anesthesia Transfer of Care Note  Patient: Angela Lawson  Procedure(s) Performed: Procedure(s) (LRB): HERNIA REPAIR INCISIONAL WITH MESH (N/A)  Patient Location: PACU  Anesthesia Type: General  Level of Consciousness: awake  Airway & Oxygen Therapy: Patient Spontanous Breathing and non-rebreather face mask  Post-op Assessment: Report given to PACU RN, Post -op Vital signs reviewed and stable and Patient moving all extremities  Post vital signs: Reviewed and stable  Complications: No apparent anesthesia complications

## 2013-08-26 NOTE — Anesthesia Preprocedure Evaluation (Addendum)
Anesthesia Evaluation  Patient identified by MRN, date of birth, ID band Patient awake    Reviewed: Allergy & Precautions, H&P , NPO status , Patient's Chart, lab work & pertinent test results  Airway Mallampati: III TM Distance: >3 FB Neck ROM: Full    Dental  (+) Edentulous Upper and Edentulous Lower   Pulmonary COPD (emphysema, s/p LL Lobectomy for Cancer) breath sounds clear to auscultation        Cardiovascular hypertension, Pt. on medications Rhythm:Regular Rate:Normal     Neuro/Psych    GI/Hepatic   Endo/Other  diabetes, Type 2, Oral Hypoglycemic Agents  Renal/GU      Musculoskeletal   Abdominal   Peds  Hematology   Anesthesia Other Findings   Reproductive/Obstetrics                           Anesthesia Physical Anesthesia Plan  ASA: III  Anesthesia Plan: General   Post-op Pain Management:    Induction: Intravenous  Airway Management Planned: Oral ETT  Additional Equipment:   Intra-op Plan:   Post-operative Plan: Extubation in OR  Informed Consent: I have reviewed the patients History and Physical, chart, labs and discussed the procedure including the risks, benefits and alternatives for the proposed anesthesia with the patient or authorized representative who has indicated his/her understanding and acceptance.     Plan Discussed with:   Anesthesia Plan Comments:         Anesthesia Quick Evaluation

## 2013-08-26 NOTE — Op Note (Signed)
Patient:  Angela Lawson  DOB:  11-21-1934  MRN:  [id number]   Preop Diagnosis:  Incisional hernia  Postop Diagnosis:  Same  Procedure:  Incisional herniorrhaphy with mesh  Surgeon:  Franky Macho, M.D.  Anes:  General endotracheal  Indications:  Patient is a 79 year old black female presents with an incisional hernia at the periumbilical region secondary to previous abdominal surgery. The risks and benefits of the procedure including bleeding, infection, and the possibility recurrence of the hernia were fully explained to the patient, who gave informed consent.  Procedure note:  The patient was placed in the supine position. After induction of general endotracheal anesthesia, the abdomen was prepped and draped using the usual sterile technique with DuraPrep. Surgical site confirmation was performed.  An infraumbilical incision was made down the fascia. The umbilicus was freed away from the underlying hernia sac. The hernia sac was entered into and omentum was noted within the hernia sac. Any adhesions were freed away sharply in order to reduce the omentum. The underside of the abdominal wall was then inspected and any adhesions were lysed using scissors. The defect measured approximately 2-1/2 cm in its greatest diameter. A large proceed ventral patch was then inserted and secured to the fascia using 0 Ethibond interrupted sutures. The overlying fascia was then closed over the patch using 0 Ethibond interrupted sutures. The umbilicus was secured back to the fascia using a 2-0 Vicryl interrupted suture. The subcutaneous layer was reapproximated using 3-0 Vicryl interrupted suture. The skin was closed using staples. 0.5% Sensorcaine was instilled into the surrounding wound. Betadine ointment and dry sterile dressings were applied.  All tape and needle counts were correct at the end of the procedure. The patient was extubated in the operating room and transferred to PACU in stable  condition.  Complications:  None  EBL:  Minimal  Specimen:  None

## 2013-08-29 ENCOUNTER — Encounter (HOSPITAL_COMMUNITY): Payer: Self-pay | Admitting: General Surgery

## 2013-11-09 ENCOUNTER — Ambulatory Visit (HOSPITAL_COMMUNITY)
Admission: RE | Admit: 2013-11-09 | Discharge: 2013-11-09 | Disposition: A | Discharge status: Home / Self Care | Payer: Medicare Other | Source: Ambulatory Visit | Attending: Internal Medicine | Admitting: Internal Medicine

## 2013-11-09 ENCOUNTER — Other Ambulatory Visit (HOSPITAL_COMMUNITY): Payer: Self-pay | Admitting: Internal Medicine

## 2013-11-09 DIAGNOSIS — M25519 Pain in unspecified shoulder: Secondary | ICD-10-CM | POA: Insufficient documentation

## 2013-11-09 DIAGNOSIS — M19019 Primary osteoarthritis, unspecified shoulder: Secondary | ICD-10-CM | POA: Insufficient documentation

## 2013-11-09 DIAGNOSIS — R52 Pain, unspecified: Secondary | ICD-10-CM

## 2013-11-16 ENCOUNTER — Ambulatory Visit (INDEPENDENT_AMBULATORY_CARE_PROVIDER_SITE_OTHER): Payer: Medicare Other | Admitting: Orthopedic Surgery

## 2013-11-16 VITALS — BP 152/70 | Ht 64.0 in | Wt 169.0 lb

## 2013-11-16 DIAGNOSIS — M75101 Unspecified rotator cuff tear or rupture of right shoulder, not specified as traumatic: Secondary | ICD-10-CM

## 2013-11-16 DIAGNOSIS — M1990 Unspecified osteoarthritis, unspecified site: Secondary | ICD-10-CM

## 2013-11-16 DIAGNOSIS — M129 Arthropathy, unspecified: Secondary | ICD-10-CM

## 2013-11-16 DIAGNOSIS — S43429A Sprain of unspecified rotator cuff capsule, initial encounter: Secondary | ICD-10-CM

## 2013-11-16 MED ORDER — ACETAMINOPHEN-CODEINE #3 300-30 MG PO TABS: 1.0000 | ORAL_TABLET | Freq: Four times a day (QID) | ORAL | Status: DC | PRN

## 2013-11-16 NOTE — Patient Instructions (Signed)
Call and schedule therapy.

## 2013-11-17 NOTE — Progress Notes (Signed)
Patient ID: Angela Lawson, female   DOB: May 04, 1934, 79 y.o.   MRN: [id number]  Chief complaint right shoulder pain  The patient reports gradual onset of sharp constant pain associated with for elevation tingling locking and catching in the right shoulder no trauma. The [place]  Review of systems she complains of wheezing and cough with snoring nervousness otherwise normal  Past Medical History  Diagnosis Date  . Cancer     left lung/ 2009/ surg only  . Hypertension   . Arthritis   . Diverticulitis   . Diabetes mellitus   . H/O ventral hernia   . Lung cancer   . COPD (chronic obstructive pulmonary disease)   . Osteoporosis     Past Surgical History  Procedure Laterality Date  . Cholecystectomy    . Ectopic pregnancy surgery    . Abdominal hysterectomy    . Lung cancer surgery    . Incisional hernia repair N/A 08/26/2013    Procedure: HERNIA REPAIR INCISIONAL WITH MESH;  Surgeon: Dalia Heading, MD;  Location: [facility];  Service: General;  Laterality: N/A;    The past, family history and social history have been reviewed and are recorded in the corresponding sections of epic   BP 152/70  Ht 5\' 4"  (1.626 m)  Wt 169 lb (76.658 kg)  BMI 28.99 kg/m2 General appearance is normal, the patient is alert and oriented x3 with normal mood and affect. She has no major gait disturbance.  She has significant pain swelling in the right shoulder with decreased range of motion crepitance weakness of the rotator cuff no instability skin normal. There is tenderness around the shoulder joint.  Cardiovascular exam shows normal sensation pulse and perfusion lymph nodes are negative reflexes normal balance normal  X-rays show degenerative arthritis of the shoulder suggest rotator cuff tear as well.  Rotator cuff induced shoulder arthritis  Recommend Tylenol No. 3 for pain and physical therapy  Injection  Return for further evaluation if no improvement she will be a candidate for total shoulder  or reverse shoulder  Shoulder Injection Procedure Note   Pre-operative Diagnosis: right  RC Syndrome  Post-operative Diagnosis: same  Indications: pain   Anesthesia: ethyl chloride   Procedure Details   Verbal consent was obtained for the procedure. The shoulder was prepped withalcohol and the skin was anesthetized. A 20 gauge needle was advanced into the subacromial space through posterior approach without difficulty  The space was then injected with 3 ml 1% lidocaine and 1 ml of depomedrol. The injection site was cleansed with isopropyl alcohol and a dressing was applied.  Complications:  None; patient tolerated the procedure well.

## 2013-11-22 ENCOUNTER — Encounter (HOSPITAL_COMMUNITY): Payer: Self-pay | Admitting: Emergency Medicine

## 2013-11-22 ENCOUNTER — Emergency Department (HOSPITAL_COMMUNITY)
Admission: EM | Admit: 2013-11-22 | Discharge: 2013-11-22 | Disposition: A | Discharge status: Home / Self Care | Payer: Medicare Other | Attending: Emergency Medicine | Admitting: Emergency Medicine

## 2013-11-22 DIAGNOSIS — E119 Type 2 diabetes mellitus without complications: Secondary | ICD-10-CM | POA: Insufficient documentation

## 2013-11-22 DIAGNOSIS — Z87891 Personal history of nicotine dependence: Secondary | ICD-10-CM | POA: Insufficient documentation

## 2013-11-22 DIAGNOSIS — K5289 Other specified noninfective gastroenteritis and colitis: Secondary | ICD-10-CM | POA: Insufficient documentation

## 2013-11-22 DIAGNOSIS — Z9889 Other specified postprocedural states: Secondary | ICD-10-CM | POA: Insufficient documentation

## 2013-11-22 DIAGNOSIS — I10 Essential (primary) hypertension: Secondary | ICD-10-CM | POA: Insufficient documentation

## 2013-11-22 DIAGNOSIS — K529 Noninfective gastroenteritis and colitis, unspecified: Secondary | ICD-10-CM

## 2013-11-22 DIAGNOSIS — E860 Dehydration: Secondary | ICD-10-CM | POA: Insufficient documentation

## 2013-11-22 DIAGNOSIS — Z7982 Long term (current) use of aspirin: Secondary | ICD-10-CM | POA: Insufficient documentation

## 2013-11-22 DIAGNOSIS — J449 Chronic obstructive pulmonary disease, unspecified: Secondary | ICD-10-CM | POA: Insufficient documentation

## 2013-11-22 DIAGNOSIS — M129 Arthropathy, unspecified: Secondary | ICD-10-CM | POA: Insufficient documentation

## 2013-11-22 DIAGNOSIS — Z85118 Personal history of other malignant neoplasm of bronchus and lung: Secondary | ICD-10-CM | POA: Insufficient documentation

## 2013-11-22 DIAGNOSIS — R630 Anorexia: Secondary | ICD-10-CM | POA: Insufficient documentation

## 2013-11-22 DIAGNOSIS — Z90710 Acquired absence of both cervix and uterus: Secondary | ICD-10-CM | POA: Insufficient documentation

## 2013-11-22 DIAGNOSIS — Z79899 Other long term (current) drug therapy: Secondary | ICD-10-CM | POA: Insufficient documentation

## 2013-11-22 DIAGNOSIS — J4489 Other specified chronic obstructive pulmonary disease: Secondary | ICD-10-CM | POA: Insufficient documentation

## 2013-11-22 DIAGNOSIS — Z9089 Acquired absence of other organs: Secondary | ICD-10-CM | POA: Insufficient documentation

## 2013-11-22 DIAGNOSIS — M810 Age-related osteoporosis without current pathological fracture: Secondary | ICD-10-CM | POA: Insufficient documentation

## 2013-11-22 LAB — BASIC METABOLIC PANEL
BUN: 17 mg/dL (ref 6–23)
CO2: 26 mEq/L (ref 19–32)
Calcium: 9.8 mg/dL (ref 8.4–10.5)
Chloride: 101 mEq/L (ref 96–112)
Creatinine, Ser: 1.21 mg/dL — ABNORMAL HIGH (ref 0.50–1.10)
GFR calc Af Amer: 49 mL/min — ABNORMAL LOW (ref >90)
GFR calc non Af Amer: 42 mL/min — ABNORMAL LOW (ref >90)
Glucose, Bld: 97 mg/dL (ref 70–99)
Potassium: 4.4 mEq/L (ref 3.5–5.1)
Sodium: 136 mEq/L (ref 135–145)

## 2013-11-22 LAB — CBC WITH DIFFERENTIAL/PLATELET
Basophils Absolute: 0 10*3/uL (ref 0.0–0.1)
Basophils Relative: 0 % (ref 0–1)
Eosinophils Absolute: 0.2 10*3/uL (ref 0.0–0.7)
Eosinophils Relative: 2 % (ref 0–5)
HCT: 43.1 % (ref 36.0–46.0)
Hemoglobin: 14.2 g/dL (ref 12.0–15.0)
Lymphocytes Relative: 17 % (ref 12–46)
Lymphs Abs: 1.5 10*3/uL (ref 0.7–4.0)
MCH: 30.8 pg (ref 26.0–34.0)
MCHC: 32.9 g/dL (ref 30.0–36.0)
MCV: 93.5 fL (ref 78.0–100.0)
Monocytes Absolute: 0.9 10*3/uL (ref 0.1–1.0)
Monocytes Relative: 10 % (ref 3–12)
Neutro Abs: 6.3 10*3/uL (ref 1.7–7.7)
Neutrophils Relative %: 70 % (ref 43–77)
Platelets: 216 10*3/uL (ref 150–400)
RBC: 4.61 MIL/uL (ref 3.87–5.11)
RDW: 13.8 % (ref 11.5–15.5)
WBC: 9 10*3/uL (ref 4.0–10.5)

## 2013-11-22 MED ORDER — ONDANSETRON HCL 4 MG/2ML IJ SOLN
4.0000 mg | Freq: Once | INTRAMUSCULAR | Status: AC
  Administered 2013-11-22: 4 mg via INTRAVENOUS
  Filled 2013-11-22: qty 2

## 2013-11-22 MED ORDER — PROMETHAZINE HCL 25 MG PO TABS: 25.0000 mg | ORAL_TABLET | Freq: Four times a day (QID) | ORAL | Status: DC | PRN

## 2013-11-22 MED ORDER — SODIUM CHLORIDE 0.9 % IV BOLUS (SEPSIS)
1000.0000 mL | Freq: Once | INTRAVENOUS | Status: AC
  Administered 2013-11-22: 1000 mL via INTRAVENOUS

## 2013-11-22 MED ORDER — DIPHENOXYLATE-ATROPINE 2.5-0.025 MG PO TABS: 1.0000 | ORAL_TABLET | Freq: Four times a day (QID) | ORAL | Status: DC | PRN

## 2013-11-22 NOTE — ED Notes (Signed)
No stool collected for c-diff. pt did not have stool while present in ER

## 2013-11-22 NOTE — ED Notes (Signed)
Pt d/ced to home note given to daughter states that pt was seen in ER today

## 2013-11-22 NOTE — ED Notes (Signed)
Pt c/o n/d with dark stool since Sunday night. Denies pain/vomitting.

## 2013-11-22 NOTE — ED Provider Notes (Signed)
CSN: [id number]     Arrival date & time 11/22/13  1019 History  This chart was scribed for Angela Hutching, MD,  by Ashley Jacobs, ED Scribe. The patient was seen in room APA14/APA14 and the patient's care was started at 11:07 AM  First MD Initiated Contact with Patient 11/22/13 1044     Chief Complaint  Patient presents with  . Nausea  . Diarrhea   (Consider location/radiation/quality/duration/timing/severity/associated sxs/prior Treatment) Patient is a 79 y.o. female presenting with diarrhea. The history is provided by the patient and medical records. No language interpreter was used.  Diarrhea Quality:  Black and tarry and watery Severity:  Moderate Onset quality:  Sudden Number of episodes:  4-5 times a day Duration:  4 days Timing:  Constant Progression:  Unchanged Relieved by:  Nothing Worsened by:  Nothing tried Ineffective treatments:  None tried  HPI Comments: Angela Lawson is a 79 y.o. female who presents to the Emergency Department complaining of nausea and diarrhea. Pt states having diarrhea 4-5 times a day for the past four days and nausea for the past four days. The stool has a watery consistency and is dark in appearance. She has the associated symptoms of nausea and decreased appetite. She denies hematochezia. Pt has a hx of C.Diff which she acquired from antibiotic use. Pt has a medical hx of left lung cancer, HTN, arthritis, diverticulitis, DM, ventral hernia, COPD and osteoporosis.  Pt's Endocrinologist for her DM is Dr. Fransico Him, MD Pt's PCP is Dr. Felecia Shelling MD.  Past Medical History  Diagnosis Date  . Cancer     left lung/ 2009/ surg only  . Hypertension   . Arthritis   . Diverticulitis   . Diabetes mellitus   . H/O ventral hernia   . Lung cancer   . COPD (chronic obstructive pulmonary disease)   . Osteoporosis    Past Surgical History  Procedure Laterality Date  . Cholecystectomy    . Ectopic pregnancy surgery    . Abdominal hysterectomy    . Lung cancer  surgery    . Incisional hernia repair N/A 08/26/2013    Procedure: HERNIA REPAIR INCISIONAL WITH MESH;  Surgeon: Dalia Heading, MD;  Location: [facility];  Service: General;  Laterality: N/A;   Family History  Problem Relation Age of Onset  . Diabetes Mother   . Hypertension Mother   . Diabetes Father   . Asthma    . Cancer     History  Substance Use Topics  . Smoking status: Former Games developer  . Smokeless tobacco: Not on file  . Alcohol Use: No   OB History   Grav Para Term Preterm Abortions TAB SAB Ect Mult Living                 Review of Systems  Gastrointestinal: Positive for nausea and diarrhea. Negative for blood in stool.  All other systems reviewed and are negative.    Allergies  Review of patient's allergies indicates no known allergies.  Home Medications   Current Outpatient Rx  Name  Route  Sig  Dispense  Refill  . acetaminophen-codeine (TYLENOL #3) 300-30 MG per tablet   Oral   Take 1 tablet by mouth every 6 (six) hours as needed for moderate pain.   60 tablet   3   . albuterol (PROVENTIL HFA;VENTOLIN HFA) 108 (90 BASE) MCG/ACT inhaler   Inhalation   Inhale 2 puffs into the lungs every 6 (six) hours as needed. Shortness of Breath         .  aspirin EC 81 MG tablet   Oral   Take 81 mg by mouth daily.         . benazepril-hydrochlorthiazide (LOTENSIN HCT) 20-12.5 MG per tablet   Oral   Take 0.5 tablets by mouth daily.          Marland Kitchen buPROPion (WELLBUTRIN SR) 150 MG 12 hr tablet   Oral   Take 150 mg by mouth 2 (two) times daily.         . Calcium Carbonate-Vitamin D (CALCIUM 600 + D PO)   Oral   Take 1 tablet by mouth daily.         Marland Kitchen denosumab (PROLIA) 60 MG/ML SOLN injection   Subcutaneous   Inject 60 mg into the skin every 6 (six) months. Administer in upper arm, thigh, or abdomen         . fish oil-omega-3 fatty acids 1000 MG capsule   Oral   Take 1 g by mouth daily.         Marland Kitchen gabapentin (NEURONTIN) 300 MG capsule   Oral   Take  300 mg by mouth daily.          . Garlic TABS   Oral   Take 1 tablet by mouth daily.         Marland Kitchen HYDROcodone-acetaminophen (NORCO/VICODIN) 5-325 MG per tablet   Oral   Take 1 tablet by mouth every 4 (four) hours as needed for pain.   40 tablet   1   . meclizine (ANTIVERT) 25 MG tablet   Oral   Take 25 mg by mouth 4 (four) times daily as needed. Dizziness         . omeprazole (PRILOSEC) 20 MG capsule   Oral   Take 20 mg by mouth daily.         . TRADJENTA 5 MG TABS tablet   Oral   Take 5 mg by mouth daily.          Marland Kitchen Umeclidinium-Vilanterol (ANORO ELLIPTA) 62.5-25 MCG/INH AEPB   Inhalation   Inhale 1 puff into the lungs daily.          BP 137/58  Pulse 81  Temp(Src) 98.3 F (36.8 C) (Oral)  Resp 18  Ht 5\' 4"  (1.626 m)  Wt 170 lb (77.111 kg)  BMI 29.17 kg/m2  SpO2 98% Physical Exam  Nursing note and vitals reviewed. Constitutional: She is oriented to person, place, and time. She appears well-developed and well-nourished.  dehydrated  HENT:  Head: Normocephalic and atraumatic.  Eyes: Conjunctivae and EOM are normal. Pupils are equal, round, and reactive to light.  Neck: Normal range of motion. Neck supple.  Cardiovascular: Normal rate, regular rhythm and normal heart sounds.   Pulmonary/Chest: Effort normal and breath sounds normal.  Abdominal: Soft. Bowel sounds are normal. There is tenderness.  Minimal diffuse abdominal tenderness  Musculoskeletal: Normal range of motion.  Neurological: She is alert and oriented to person, place, and time.  Skin: Skin is warm and dry.  Psychiatric: She has a normal mood and affect. Her behavior is normal.    ED Course  Procedures (including critical care time) DIAGNOSTIC STUDIES: Oxygen Saturation is 98% on room air, normal by my interpretation.    COORDINATION OF CARE: 11:07 AM Discussed course of care with pt which includes laboratory tests and Zofran 4mg . Pt understands and agrees.  Labs Review Labs  Reviewed  BASIC METABOLIC PANEL - Abnormal; Notable for the following:    Creatinine, Ser 1.21 (*)  GFR calc non Af Amer 42 (*)    GFR calc Af Amer 49 (*)    All other components within normal limits  CLOSTRIDIUM DIFFICILE BY PCR  CBC WITH DIFFERENTIAL   Imaging Review No results found.  EKG Interpretation   None       MDM  No diagnosis found. Patient is ambulatory. No acute distress. Normal vital signs. No acute abdomen.  Discharge medications Phenergan 25 mg and Lomotil   I personally performed the services described in this documentation, which was scribed in my presence. The recorded information has been reviewed and is accurate.     Angela Hutching, MD 11/22/13 1414

## 2013-11-30 ENCOUNTER — Ambulatory Visit (HOSPITAL_COMMUNITY)
Admission: RE | Admit: 2013-11-30 | Discharge: 2013-11-30 | Disposition: A | Discharge status: Home / Self Care | Payer: Medicare Other | Source: Ambulatory Visit | Attending: Orthopedic Surgery | Admitting: Orthopedic Surgery

## 2013-11-30 DIAGNOSIS — IMO0001 Reserved for inherently not codable concepts without codable children: Secondary | ICD-10-CM | POA: Insufficient documentation

## 2013-11-30 DIAGNOSIS — M62838 Other muscle spasm: Secondary | ICD-10-CM | POA: Insufficient documentation

## 2013-11-30 DIAGNOSIS — M25519 Pain in unspecified shoulder: Secondary | ICD-10-CM | POA: Insufficient documentation

## 2013-11-30 DIAGNOSIS — M6281 Muscle weakness (generalized): Secondary | ICD-10-CM | POA: Insufficient documentation

## 2013-11-30 DIAGNOSIS — M75101 Unspecified rotator cuff tear or rupture of right shoulder, not specified as traumatic: Secondary | ICD-10-CM

## 2013-11-30 DIAGNOSIS — M25619 Stiffness of unspecified shoulder, not elsewhere classified: Secondary | ICD-10-CM | POA: Insufficient documentation

## 2013-11-30 DIAGNOSIS — M25511 Pain in right shoulder: Secondary | ICD-10-CM

## 2013-11-30 NOTE — Evaluation (Signed)
Occupational Therapy Evaluation  Patient Details  Name: Angela Lawson MRN: [id number] Date of Birth: 1934/12/01  Today's Date: 11/30/2013 Time: 2952-8413 OT Time Calculation (min): 30 min OT Evaluation 30' Visit#: 1 of 12  Re-eval: 12/28/13  Assessment Diagnosis: Right Rotator Cuff Tear and Arthritis Next MD Visit: one month Prior Therapy: n/a  Authorization: [employer] Medicare  Authorization Time Period: before 10th visit  Authorization Visit#: 1 of 10   Past Medical History:  Past Medical History  Diagnosis Date  . Cancer     left lung/ 2009/ surg only  . Hypertension   . Arthritis   . Diverticulitis   . Diabetes mellitus   . H/O ventral hernia   . Lung cancer   . COPD (chronic obstructive pulmonary disease)   . Osteoporosis    Past Surgical History:  Past Surgical History  Procedure Laterality Date  . Cholecystectomy    . Ectopic pregnancy surgery    . Abdominal hysterectomy    . Lung cancer surgery    . Incisional hernia repair N/A 08/26/2013    Procedure: HERNIA REPAIR INCISIONAL WITH MESH;  Surgeon: Dalia Heading, MD;  Location: [facility];  Service: General;  Laterality: N/A;    Subjective S:  My shoulder started hurting about a month ago.   Pertinent History: [name] reports that she began experiencing increased pain and decreased mobility in her right shoulder approximately one month ago.  She does not remember injuring her arm prior to this date.  She consulted with Dr. Romeo Apple and was diagnosed with a torn rotator cuff and arthritis.  She received a cortisone injection and has been referred to occupational therapy for evaluation and treatment.  Limitations: n/a Special Tests: FOTO scored 51 Patient Stated Goals: I want to get rid of the pain I am feeling.  Pain Assessment Currently in Pain?: Yes Pain Score: 5  Pain Location: Shoulder Pain Orientation: Right Pain Type: Acute pain  Precautions/Restrictions  Precautions Precautions:  None Restrictions Weight Bearing Restrictions: No  Balance Screening Balance Screen Has the patient fallen in the past 6 months: No Has the patient had a decrease in activity level because of a fear of falling? : No Is the patient reluctant to leave their home because of a fear of falling? : No  Prior Functioning  Home Living Additional Comments: Patient lives with her daughter Prior Function Driving: Yes Vocation: Retired Comments: enjoys Editor, commissioning ADL/Vision/Perception ADL ADL Comments: Patient is having difficulty reaching forward and to the side, she is unable to wash her hair unless she leans her head forward towards her lap Dominant Hand: Right Vision - History Baseline Vision: Wears glasses all the time Perception Perception: Within Functional Limits Praxis Praxis: Intact  Cognition/Observation Cognition Overall Cognitive Status: Within Functional Limits for tasks assessed  Sensation/Coordination/Edema Sensation Light Touch: Appears Intact Coordination Gross Motor Movements are Fluid and Coordinated: Yes Fine Motor Movements are Fluid and Coordinated: Yes  Additional Assessments RUE AROM (degrees) RUE Overall AROM Comments: assessed in seated, ER/IR with shoulder adducted  Right Shoulder Flexion: 90 Degrees Right Shoulder ABduction: 80 Degrees Right Shoulder Internal Rotation: 85 Degrees Right Shoulder External Rotation: 55 Degrees RUE PROM (degrees) RUE Overall PROM Comments: assessed in seated, ER/IR with shoulder adducted Right Shoulder Flexion: 110 Degrees Right Shoulder ABduction: 105 Degrees RUE Strength RUE Overall Strength Comments: asessed in seated, ER/IR with shoulder adducted Right Shoulder Flexion: 3-/5 Right Shoulder ABduction: 3-/5 Right Shoulder Internal Rotation: 3-/5 Right Shoulder External Rotation: 3-/5 Palpation  Palpation: very tender to touch, moderate fascial restrictions in right shoulder, upper arm, and scapular  region      Exercise/Treatments Stretches Table Stretch - Flexion: 60 seconds Table Stretch - Abduction: 60 seconds Table Stretch - External Rotation: 60 seconds Other Shoulder Stretches: table stretch - horizontal abduction 60 seconds    Manual Therapy Manual Therapy: Myofascial release Myofascial Release: MFR and manual stretching to right upper arm, scapular, and shoulder region to decrease pain and fascial restrictions and increase pain free mobility.  Occupational Therapy Assessment and Plan OT Assessment and Plan Clinical Impression Statement: A:  Patient presents with decreased ability to participate in ADLs due to decreased AROM and strength and increased pain and restrictions in her right shoulder.   Pt will benefit from skilled therapeutic intervention in order to improve on the following deficits: Decreased range of motion;Decreased strength;Increased fascial restricitons;Increased muscle spasms;Pain Rehab Potential: Good OT Frequency: Min 2X/week OT Duration: 6 weeks OT Treatment/Interventions: Self-care/ADL training;Therapeutic exercise;Modalities;Manual therapy;Therapeutic activities;Patient/family education OT Plan: P:  Skilled OT intervention to decrease pain and fascial restrictions and improve pain free mobility in her right shoulder for return to prior level of independence with all B/IADLs. Treatment Plan:  MFR and manual stretching, PROM, AAROM progress as tolerated, ball stretches, seated elev, ext, row., pulleys.    Goals Short Term Goals Time to Complete Short Term Goals: 3 weeks Short Term Goal 1: Patient will be educated on a HEP.  Short Term Goal 2: Patient will improve right shoulder PROM by 25% in her right shoulder for increased ability to wash her hair. Short Term Goal 3: Patient will improve right shoulder strength to 3+/5 for increased ability to lift laundry basket. Short Term Goal 4: Patient will decrease pain to 3/10 when completing daily activities.   Short Term Goal 5: Patient will decrease fascial restrictions to min-mod in her right shoulder region. Long Term Goals Time to Complete Long Term Goals: 6 weeks Long Term Goal 1: Patient will return to prior level of independence with all B/IADLs and leisure activities.  Long Term Goal 2: Patient will improve right shoulder AROM to [facility] for increased ability to reach into overhead cabinet. Long Term Goal 3: Patient will improve right shoulder strength to 4-/5 for increased ability to propel the vacuum cleaner. Long Term Goal 4: Patient will decrease right shoulder pain to 2/10 when completing daily activities.  Long Term Goal 5: Patient will decrease right shoulder restrictions to minimal.  Problem List Patient Active Problem List   Diagnosis Date Noted  . Non-traumatic tear of right rotator cuff 11/30/2013  . Pain in joint, shoulder region 11/30/2013  . Muscle weakness (generalized) 11/30/2013  . Bursitis of left shoulder 02/09/2013  . Malignant neoplasm of bronchus and lung, unspecified site 08/20/2012    End of Session Activity Tolerance: Patient tolerated treatment well General Behavior During Therapy: [facility] for tasks assessed/performed OT Plan of Care OT Home Exercise Plan: towel stretches, patient demonstrated understanding this date and was issued handout OT Patient Instructions: hand out scanned. Consulted and Agree with Plan of Care: Patient  GO Functional Assessment Tool Used: FOTO scored 51 independent, 49 impairment  Functional Limitation: Self care Self Care Current Status (Z6109): At least 40 percent but less than 60 percent impaired, limited or restricted Self Care Goal Status (U0454): At least 20 percent but less than 40 percent impaired, limited or restricted  Shirlean Mylar, OTR/L  11/30/2013, 10:19 AM  Physician Documentation Your signature is required to indicate approval  of the treatment plan as stated above.  Please sign and either send electronically or  make a copy of this report for your files and return this physician signed original.  Please mark one 1.__approve of plan  2. ___approve of plan with the following conditions.   ______________________________                                                          _____________________ Physician Signature                                                                                                             Date

## 2013-12-05 ENCOUNTER — Other Ambulatory Visit: Payer: Self-pay | Admitting: *Deleted

## 2013-12-05 DIAGNOSIS — M75101 Unspecified rotator cuff tear or rupture of right shoulder, not specified as traumatic: Secondary | ICD-10-CM

## 2013-12-05 DIAGNOSIS — M1990 Unspecified osteoarthritis, unspecified site: Secondary | ICD-10-CM

## 2013-12-06 ENCOUNTER — Ambulatory Visit (HOSPITAL_COMMUNITY)
Admission: RE | Admit: 2013-12-06 | Discharge: 2013-12-06 | Disposition: A | Discharge status: Home / Self Care | Payer: Medicare Other | Source: Ambulatory Visit | Attending: Internal Medicine | Admitting: Internal Medicine

## 2013-12-06 NOTE — Progress Notes (Signed)
Occupational Therapy Treatment Patient Details  Name: OTIE HEADLEE MRN: [id number] Date of Birth: Oct 14, 1934  Today's Date: 12/06/2013 Time: 8657-8469 OT Time Calculation (min): 38 min Manual therapy: 0935-1000 (25') Therapeutic Exercise: 1000-1013 (13')   Visit#: 2 of 12  Re-eval: 12/28/13    Authorization: [employer] Medicare  Authorization Time Period: before 10th visit  Authorization Visit#: 2 of 10  Subjective Symptoms/Limitations Symptoms: S:  Looks like my exercises have been helping some  Pain Assessment Currently in Pain?: No/denies   Exercise/Treatments Supine Protraction: PROM;10 reps Horizontal ABduction: PROM;10 reps External Rotation: PROM;10 reps Internal Rotation: PROM;10 reps Flexion: PROM;10 reps ABduction: PROM;10 reps Other Supine Exercises: Bridges - 15 reps Seated Elevation: AROM;10 reps Extension: AROM;12 reps Row: AROM;10 reps   Therapy Ball Flexion: 15 reps ABduction: 15 reps     Manual Therapy Myofascial Release: MFR and manual stretching to right upper arm, scapular, and shoulder region to decrease pain and fascial restrictions and increase pain free mobility  Occupational Therapy Assessment and Plan OT Assessment and Plan Clinical Impression Statement: A: Pt continues to have pain w PROM in supine, continues to have guarding postures, reports good follow-through at home w table-slide HEP. Initiated ball rolls and seated AAROM. OT Plan: P: Follow up w seated exercises. Cont pain management. Initiate pulleys. Isometirc strengthening in supine  Goals Short Term Goals Short Term Goal 1: Patient will be educated on a HEP.  Short Term Goal 1 Progress: Progressing toward goal Short Term Goal 2: Patient will improve right shoulder PROM by 25% in her right shoulder for increased ability to wash her hair. Short Term Goal 2 Progress: Progressing toward goal Short Term Goal 3: Patient will improve right shoulder strength to 3+/5 for increased  ability to lift laundry basket. Short Term Goal 3 Progress: Progressing toward goal Short Term Goal 4: Patient will decrease pain to 3/10 when completing daily activities.  Short Term Goal 4 Progress: Progressing toward goal Short Term Goal 5: Patient will decrease fascial restrictions to min-mod in her right shoulder region. Short Term Goal 5 Progress: Progressing toward goal Long Term Goals Long Term Goal 1: Patient will return to prior level of independence with all B/IADLs and leisure activities.  Long Term Goal 1 Progress: Progressing toward goal Long Term Goal 2: Patient will improve right shoulder AROM to [facility] for increased ability to reach into overhead cabinet. Long Term Goal 2 Progress: Progressing toward goal Long Term Goal 3: Patient will improve right shoulder strength to 4-/5 for increased ability to propel the vacuum cleaner. Long Term Goal 3 Progress: Progressing toward goal Long Term Goal 4: Patient will decrease right shoulder pain to 2/10 when completing daily activities.  Long Term Goal 4 Progress: Progressing toward goal Long Term Goal 5: Patient will decrease right shoulder restrictions to minimal. Long Term Goal 5 Progress: Progressing toward goal  Problem List Patient Active Problem List   Diagnosis Date Noted  . Non-traumatic tear of right rotator cuff 11/30/2013  . Pain in joint, shoulder region 11/30/2013  . Muscle weakness (generalized) 11/30/2013  . Bursitis of left shoulder 02/09/2013  . Malignant neoplasm of bronchus and lung, unspecified site 08/20/2012    End of Session Activity Tolerance: Patient tolerated treatment well General Behavior During Therapy: [facility] for tasks assessed/performed  GO    Velora Mediate, OTR/L  12/06/2013, 10:25 AM

## 2013-12-13 ENCOUNTER — Ambulatory Visit (HOSPITAL_COMMUNITY)
Admission: RE | Admit: 2013-12-13 | Discharge: 2013-12-13 | Disposition: A | Discharge status: Home / Self Care | Payer: Medicare Other | Source: Ambulatory Visit | Attending: Orthopedic Surgery | Admitting: Orthopedic Surgery

## 2013-12-13 DIAGNOSIS — IMO0001 Reserved for inherently not codable concepts without codable children: Secondary | ICD-10-CM | POA: Insufficient documentation

## 2013-12-13 DIAGNOSIS — M25519 Pain in unspecified shoulder: Secondary | ICD-10-CM | POA: Insufficient documentation

## 2013-12-13 DIAGNOSIS — M6281 Muscle weakness (generalized): Secondary | ICD-10-CM | POA: Insufficient documentation

## 2013-12-13 DIAGNOSIS — M62838 Other muscle spasm: Secondary | ICD-10-CM | POA: Insufficient documentation

## 2013-12-13 DIAGNOSIS — M25619 Stiffness of unspecified shoulder, not elsewhere classified: Secondary | ICD-10-CM | POA: Insufficient documentation

## 2013-12-13 NOTE — Progress Notes (Signed)
Occupational Therapy Treatment Patient Details  Name: Angela Lawson MRN: [id number] Date of Birth: 1934-02-03  Today's Date: 12/13/2013 Time: 2536-6440 OT Time Calculation (min): 41 min Manual 3474-2595 (25') TherExercises 6387-5643 (16')  Visit#: 3 of 12  Re-eval: 12/28/13    Authorization: [facility] Medicare  Authorization Time Period: before 10th visit  Authorization Visit#: 3 of 10  Subjective Symptoms/Limitations Symptoms: S:  It still hurts right in the joint looks like. Pain Assessment Currently in Pain?: Yes Pain Score: 5  Pain Location: Shoulder Pain Orientation: Right Pain Type: Acute pain Multiple Pain Sites: No  Exercise/Treatments Supine Protraction: PROM;10 reps Horizontal ABduction: PROM;10 reps External Rotation: PROM;10 reps Internal Rotation: PROM;10 reps Flexion: PROM;10 reps ABduction: PROM;10 reps Other Supine Exercises: Bridges - 15 reps Seated Elevation: AROM;10 reps Extension: AROM;12 reps Row: AROM;10 reps Therapy Ball Flexion: 15 reps ABduction: 15 reps    Manual Therapy Manual Therapy: Myofascial release Myofascial Release: MFR and manual stretching to right upper arm, scapular, and shoulder region to decrease pain and fascial restrictions and increase pain free mobility  Occupational Therapy Assessment and Plan OT Assessment and Plan Clinical Impression Statement: A:  Patient cont with increased pain with flexion range this date and noted popping. patient states it has been popping a little more.  Patient with good follow through and increased tolerance for exercises this date.  She needs increased cues for form and decreasing shoulder hike.   OT Plan: P: Cont pain management. Initiate isometrics and pulleys.   Goals Short Term Goals Short Term Goal 1: Patient will be educated on a HEP.  Short Term Goal 1 Progress: Progressing toward goal Short Term Goal 2: Patient will improve right shoulder PROM by 25% in her right shoulder for increased  ability to wash her hair. Short Term Goal 2 Progress: Progressing toward goal Short Term Goal 3: Patient will improve right shoulder strength to 3+/5 for increased ability to lift laundry basket. Short Term Goal 3 Progress: Progressing toward goal Short Term Goal 4: Patient will decrease pain to 3/10 when completing daily activities.  Short Term Goal 4 Progress: Progressing toward goal Short Term Goal 5: Patient will decrease fascial restrictions to min-mod in her right shoulder region. Short Term Goal 5 Progress: Progressing toward goal Long Term Goals Long Term Goal 1: Patient will return to prior level of independence with all B/IADLs and leisure activities.  Long Term Goal 1 Progress: Progressing toward goal Long Term Goal 2: Patient will improve right shoulder AROM to [facility] for increased ability to reach into overhead cabinet. Long Term Goal 2 Progress: Progressing toward goal Long Term Goal 3: Patient will improve right shoulder strength to 4-/5 for increased ability to propel the vacuum cleaner. Long Term Goal 3 Progress: Progressing toward goal Long Term Goal 4: Patient will decrease right shoulder pain to 2/10 when completing daily activities.  Long Term Goal 4 Progress: Progressing toward goal Long Term Goal 5: Patient will decrease right shoulder restrictions to minimal. Long Term Goal 5 Progress: Progressing toward goal  Problem List Patient Active Problem List   Diagnosis Date Noted  . Non-traumatic tear of right rotator cuff 11/30/2013  . Pain in joint, shoulder region 11/30/2013  . Muscle weakness (generalized) 11/30/2013  . Bursitis of left shoulder 02/09/2013  . Malignant neoplasm of bronchus and lung, unspecified site 08/20/2012    End of Session Activity Tolerance: Patient tolerated treatment well General Behavior During Therapy: [facility] for tasks assessed/performed  GO    Donney Rankins, OTR/L  12/13/2013, 9:47 AM

## 2013-12-15 ENCOUNTER — Ambulatory Visit (HOSPITAL_COMMUNITY): Payer: Medicare Other

## 2013-12-20 ENCOUNTER — Ambulatory Visit (HOSPITAL_COMMUNITY)
Admission: RE | Admit: 2013-12-20 | Discharge: 2013-12-20 | Disposition: A | Discharge status: Home / Self Care | Payer: Medicare Other | Source: Ambulatory Visit | Attending: Internal Medicine | Admitting: Internal Medicine

## 2013-12-22 ENCOUNTER — Inpatient Hospital Stay (HOSPITAL_COMMUNITY): Admission: RE | Admit: 2013-12-22 | Payer: Medicare Other | Source: Ambulatory Visit

## 2013-12-22 NOTE — Progress Notes (Signed)
Occupational Therapy Treatment Patient Details  Name: CHRISTASIA ANGELETTI MRN: [id number] Date of Birth: [birth year]  Today's Date: 12/20/2013 Time: 1950-9326 OT Time Calculation (min): 43 min Manual 0850-0910 (20') TherExercises 7124-5809 (23')  Visit#: 4 of 12  Re-eval: 12/28/13    Authorization: [employer] Medicare  Authorization Time Period: before 10th visit  Authorization Visit#: 4 of 10  Subjective Symptoms/Limitations Symptoms: S:  I can raise my arm up higher Pain Assessment Currently in Pain?: Yes Pain Score: 5  Pain Location: Shoulder Pain Orientation: Right Pain Type: Acute pain     Exercise/Treatments  12/20/13 0800  Shoulder Exercises: Supine  Protraction AAROM;12 reps  Horizontal ABduction AAROM;12 reps  External Rotation AAROM;12 reps  Internal Rotation AAROM;12 reps  Flexion AAROM;12 reps  ABduction AAROM;12 reps  Other Supine Exercises Bridges - 15 reps  Shoulder Exercises: Seated  Elevation AROM;12 reps  Retraction AROM;12 reps  External Rotation AROM;10 reps  Internal Rotation AROM;10 reps  Shoulder Exercises: Therapy Ball  Flexion 15 reps  ABduction 15 reps  Shoulder Exercises: ROM/Strengthening  Wall Wash 1 min in flexion (low range )  Proximal Shoulder Strengthening, Supine 1'      Manual Therapy Manual Therapy: Myofascial release Myofascial Release: MFR and manual stretching to right upper arm, scapular, and shoulder region to decrease pain and fascial restrictions and increase pain free mobility  Occupational Therapy Assessment and Plan OT Assessment and Plan Clinical Impression Statement: A: Patient with improved range this date and report of improved pain with movment however continues with popping.  Patient demos improved form this date.  initiated wall wash this date with complaint of fatigue.  OT Plan: P:  Initiate isometrics and pulleys   Goals Short Term Goals Short Term Goal 1: Patient will be educated on a HEP.  Short Term Goal 1  Progress: Progressing toward goal Short Term Goal 2: Patient will improve right shoulder PROM by 25% in her right shoulder for increased ability to wash her hair. Short Term Goal 2 Progress: Progressing toward goal Short Term Goal 3: Patient will improve right shoulder strength to 3+/5 for increased ability to lift laundry basket. Short Term Goal 3 Progress: Progressing toward goal Short Term Goal 4: Patient will decrease pain to 3/10 when completing daily activities.  Short Term Goal 4 Progress: Progressing toward goal Short Term Goal 5: Patient will decrease fascial restrictions to min-mod in her right shoulder region. Short Term Goal 5 Progress: Progressing toward goal Long Term Goals Long Term Goal 1: Patient will return to prior level of independence with all B/IADLs and leisure activities.  Long Term Goal 1 Progress: Progressing toward goal Long Term Goal 2: Patient will improve right shoulder AROM to [facility] for increased ability to reach into overhead cabinet. Long Term Goal 2 Progress: Progressing toward goal Long Term Goal 3: Patient will improve right shoulder strength to 4-/5 for increased ability to propel the vacuum cleaner. Long Term Goal 3 Progress: Progressing toward goal Long Term Goal 4: Patient will decrease right shoulder pain to 2/10 when completing daily activities.  Long Term Goal 4 Progress: Progressing toward goal Long Term Goal 5: Patient will decrease right shoulder restrictions to minimal. Long Term Goal 5 Progress: Progressing toward goal  Problem List Patient Active Problem List   Diagnosis Date Noted  . Non-traumatic tear of right rotator cuff 11/30/2013  . Pain in joint, shoulder region 11/30/2013  . Muscle weakness (generalized) 11/30/2013  . Bursitis of left shoulder 02/09/2013  . Malignant neoplasm of bronchus  and lung, unspecified site 08/20/2012    End of Session Activity Tolerance: Patient tolerated treatment well General Behavior During Therapy:  [facility] for tasks assessed/performed  GO    Donney Rankins, OTR/L  12/20/2013, 9:35 AM

## 2013-12-27 ENCOUNTER — Inpatient Hospital Stay (HOSPITAL_COMMUNITY): Admission: RE | Admit: 2013-12-27 | Payer: Medicare Other | Source: Ambulatory Visit | Admitting: Specialist

## 2013-12-29 ENCOUNTER — Ambulatory Visit (HOSPITAL_COMMUNITY)
Admission: RE | Admit: 2013-12-29 | Discharge: 2013-12-29 | Disposition: A | Discharge status: Home / Self Care | Payer: Medicare Other | Source: Ambulatory Visit | Attending: Internal Medicine | Admitting: Internal Medicine

## 2013-12-29 DIAGNOSIS — M6281 Muscle weakness (generalized): Secondary | ICD-10-CM

## 2013-12-29 DIAGNOSIS — M25519 Pain in unspecified shoulder: Secondary | ICD-10-CM

## 2013-12-29 DIAGNOSIS — M75101 Unspecified rotator cuff tear or rupture of right shoulder, not specified as traumatic: Secondary | ICD-10-CM

## 2013-12-29 NOTE — Evaluation (Signed)
Occupational Therapy Re-Evaluation  Patient Details  Name: Angela Lawson MRN: [id number] Date of Birth: 08-Jan-1934  Today's Date: 12/29/2013 Time: 8469-6295 OT Time Calculation (min): 41 min Manual therapy 284-132 12' Reassessment [phone] 29' Visit#: 5 of 12  Re-eval: 12/29/13  Assessment Diagnosis: Right Rotator Cuff Tear and Arthritis  Authorization: [facility] Medicare  Authorization Time Period: before 10th visit  Authorization Visit#: 5 of 10   Past Medical History:  Past Medical History  Diagnosis Date  . Cancer     left lung/ 2009/ surg only  . Hypertension   . Arthritis   . Diverticulitis   . Diabetes mellitus   . H/O ventral hernia   . Lung cancer   . COPD (chronic obstructive pulmonary disease)   . Osteoporosis    Past Surgical History:  Past Surgical History  Procedure Laterality Date  . Cholecystectomy    . Ectopic pregnancy surgery    . Abdominal hysterectomy    . Lung cancer surgery    . Incisional hernia repair N/A 08/26/2013    Procedure: HERNIA REPAIR INCISIONAL WITH MESH;  Surgeon: Jamesetta So, MD;  Location: [facility];  Service: General;  Laterality: N/A;    Subjective S:  It isnt any worse,  I just want to be able to do what I want with it.  i can reach overhead easier and I cant lift anything heavy. Special Tests: FOTO score was 51 and is currently is 82 Pain Assessment Currently in Pain?: Yes Pain Score: 3  Pain Location: Shoulder Pain Orientation: Right Pain Type: Acute pain  Additional Assessments RUE AROM (degrees) RUE Overall AROM Comments: assessed in seated, ER/IR with shoulder adducted  (11/30/13 seated) Right Shoulder Flexion: 150 Degrees (90) Right Shoulder ABduction: 160 Degrees (80) Right Shoulder Internal Rotation: 90 Degrees (85) Right Shoulder External Rotation: 55 Degrees RUE PROM (degrees) RUE Overall PROM Comments: 52 (55) RUE Strength RUE Overall Strength Comments: asessed in seated, ER/IR with shoulder adducted  (11/30/13) Right Shoulder Flexion: 4/5 (3-/5) Right Shoulder ABduction: 4/5 (3-/5) Right Shoulder Internal Rotation: 4/5 (3-/5) Right Shoulder External Rotation: 4/5 (3-/5)     Exercise/Treatments Supine Protraction: PROM;10 reps Horizontal ABduction: PROM;10 reps External Rotation: PROM;10 reps Internal Rotation: PROM;10 reps Flexion: PROM;10 reps ABduction: PROM;10 reps      Manual Therapy Manual Therapy: Myofascial release Myofascial Release: MFR and manual stretching to right upper arm, scapular, and shoulder region to decrease pain and fascial restrictions and increase pain free mobility  Occupational Therapy Assessment and Plan OT Assessment and Plan Clinical Impression Statement: A:  Reassessment completed this date.  Patient has made significant improvement in range of motion, strength.  She is satisfied with her current functional status and will be dc this date from skilled OT services.  OT Plan: P:  DC skilled OT services this date.  Patient to continue with HEP for scapular stabilization and shoudler stretches.    Goals Short Term Goals Short Term Goal 1: Patient will be educated on a HEP.  Short Term Goal 1 Progress: Met Short Term Goal 2: Patient will improve right shoulder PROM by 25% in her right shoulder for increased ability to wash her hair. Short Term Goal 2 Progress: Met Short Term Goal 3: Patient will improve right shoulder strength to 3+/5 for increased ability to lift laundry basket. Short Term Goal 3 Progress: Met Short Term Goal 4: Patient will decrease pain to 3/10 when completing daily activities.  Short Term Goal 4 Progress: Met Short Term Goal 5:  Patient will decrease fascial restrictions to min-mod in her right shoulder region. Short Term Goal 5 Progress: Met Long Term Goals Long Term Goal 1: Patient will return to prior level of independence with all B/IADLs and leisure activities.  Long Term Goal 1 Progress: Met Long Term Goal 2: Patient will  improve right shoulder AROM to [facility] for increased ability to reach into overhead cabinet. Long Term Goal 2 Progress: Met Long Term Goal 3: Patient will improve right shoulder strength to 4-/5 for increased ability to propel the vacuum cleaner. Long Term Goal 3 Progress: Met Long Term Goal 4: Patient will decrease right shoulder pain to 2/10 when completing daily activities.  Long Term Goal 4 Progress: Progressing toward goal Long Term Goal 5: Patient will decrease right shoulder restrictions to minimal. Long Term Goal 5 Progress: Met  Problem List Patient Active Problem List   Diagnosis Date Noted  . Non-traumatic tear of right rotator cuff 11/30/2013  . Pain in joint, shoulder region 11/30/2013  . Muscle weakness (generalized) 11/30/2013  . Bursitis of left shoulder 02/09/2013  . Malignant neoplasm of bronchus and lung, unspecified site 08/20/2012    End of Session Activity Tolerance: Patient tolerated treatment well General Behavior During Therapy: [facility] for tasks assessed/performed OT Plan of Care OT Home Exercise Plan: corner stretch, posterior capsule stretch, flexion stretch, theraband for scapular stability.  OT Patient Instructions: hand out scanned. Consulted and Agree with Plan of Care: Patient  GO Functional Assessment Tool Used: FOTO score was 51 and is currently 82 Functional Limitation: Self care Self Care Goal Status (X1155): At least 20 percent but less than 40 percent impaired, limited or restricted Self Care Discharge Status [phone]): At least 1 percent but less than 20 percent impaired, limited or restricted  Vangie Bicker, OTR/L  12/29/2013, 12:01 PM  Physician Documentation Your signature is required to indicate approval of the treatment plan as stated above.  Please sign and either send electronically or make a copy of this report for your files and return this physician signed original.  Please mark one 1.__approve of plan  2. ___approve of plan with the  following conditions.   ______________________________                                                          _____________________ Physician Signature                                                                                                             Date

## 2014-01-17 ENCOUNTER — Ambulatory Visit: Payer: Medicare Other | Admitting: Orthopedic Surgery

## 2014-01-18 ENCOUNTER — Ambulatory Visit: Payer: Medicare Other | Admitting: Orthopedic Surgery

## 2014-01-25 ENCOUNTER — Ambulatory Visit: Payer: Medicare Other | Admitting: Orthopedic Surgery

## 2014-02-01 ENCOUNTER — Ambulatory Visit (INDEPENDENT_AMBULATORY_CARE_PROVIDER_SITE_OTHER): Payer: Medicare Other | Admitting: Orthopedic Surgery

## 2014-02-01 ENCOUNTER — Encounter: Payer: Self-pay | Admitting: Orthopedic Surgery

## 2014-02-01 VITALS — BP 104/55 | Ht 64.0 in | Wt 169.0 lb

## 2014-02-01 DIAGNOSIS — M75101 Unspecified rotator cuff tear or rupture of right shoulder, not specified as traumatic: Secondary | ICD-10-CM

## 2014-02-01 DIAGNOSIS — S43429A Sprain of unspecified rotator cuff capsule, initial encounter: Secondary | ICD-10-CM

## 2014-02-01 NOTE — Patient Instructions (Signed)
MRI ordered

## 2014-02-01 NOTE — Progress Notes (Signed)
Patient ID: Angela Lawson, female   DOB: [birth year], 80 y.o.   MRN: [id number]  Chief Complaint  Patient presents with  . Follow-up    2 month recheck right shoulder s/p therapy   Encounter Diagnosis  Name Primary?  . Rotator cuff tear, right Yes   BP 104/55  Ht 5\' 4"  (1.626 m)  Wt 169 lb (76.658 kg)  BMI 28.99 kg/m2  The patient has had physical therapy subacromial injection and Tylenol No. 3 for pain continues to complain of pain in the front of the shoulder and catching with some improvement overall in range of motion but still has not regained full for elevation  Her x-ray shows proximal migration of the humerus with a fairly normal glenohumeral joint  Repeat examination reveals painful for elevation catching weakness normal external rotation neurovascular exam is intact General appearance is normal, the patient is alert and oriented x3 with normal mood and affect.  Recommend MRI to evaluate rotator cuff for possible repair versus possible hemiarthroplasty versus reverse prosthesis

## 2014-02-13 ENCOUNTER — Ambulatory Visit (HOSPITAL_COMMUNITY)
Admission: RE | Admit: 2014-02-13 | Discharge: 2014-02-13 | Disposition: A | Discharge status: Home / Self Care | Payer: Medicare Other | Source: Ambulatory Visit | Attending: Orthopedic Surgery | Admitting: Orthopedic Surgery

## 2014-02-13 ENCOUNTER — Telehealth: Payer: Self-pay | Admitting: Orthopedic Surgery

## 2014-02-13 DIAGNOSIS — S46919A Strain of unspecified muscle, fascia and tendon at shoulder and upper arm level, unspecified arm, initial encounter: Secondary | ICD-10-CM | POA: Insufficient documentation

## 2014-02-13 DIAGNOSIS — M25519 Pain in unspecified shoulder: Secondary | ICD-10-CM | POA: Insufficient documentation

## 2014-02-13 DIAGNOSIS — S46819A Strain of other muscles, fascia and tendons at shoulder and upper arm level, unspecified arm, initial encounter: Secondary | ICD-10-CM | POA: Insufficient documentation

## 2014-02-13 DIAGNOSIS — M19019 Primary osteoarthritis, unspecified shoulder: Secondary | ICD-10-CM | POA: Insufficient documentation

## 2014-02-13 DIAGNOSIS — M6789 Other specified disorders of synovium and tendon, multiple sites: Secondary | ICD-10-CM | POA: Insufficient documentation

## 2014-02-13 DIAGNOSIS — X58XXXA Exposure to other specified factors, initial encounter: Secondary | ICD-10-CM | POA: Insufficient documentation

## 2014-02-13 DIAGNOSIS — M75101 Unspecified rotator cuff tear or rupture of right shoulder, not specified as traumatic: Secondary | ICD-10-CM

## 2014-02-13 NOTE — Telephone Encounter (Signed)
Mri shoulder   Will need repair or replace  Discussed with the patient possibility of repair versus replacement. We will initially go in for a repair of the supraspinatus and infraspinatus tendon but be prepared to do a hemiarthroplasty with CTA prosthesis.

## 2014-02-20 ENCOUNTER — Telehealth: Payer: Self-pay | Admitting: Orthopedic Surgery

## 2014-02-20 NOTE — Telephone Encounter (Signed)
Angela Lawson left a message asking about scheduling surgery.

## 2014-02-20 NOTE — Telephone Encounter (Signed)
Returned call to patient to let her know that Dr. Aline Brochure had sent me a note about [place] scheduling her surgery together, however, he has been out of the office, but we would be in touch with her as soon as we get everything scheduled.

## 2014-02-23 ENCOUNTER — Other Ambulatory Visit (HOSPITAL_COMMUNITY): Payer: Self-pay

## 2014-02-23 DIAGNOSIS — C3490 Malignant neoplasm of unspecified part of unspecified bronchus or lung: Secondary | ICD-10-CM

## 2014-02-24 ENCOUNTER — Other Ambulatory Visit (HOSPITAL_COMMUNITY): Payer: Self-pay | Admitting: Oncology

## 2014-02-24 ENCOUNTER — Other Ambulatory Visit (HOSPITAL_COMMUNITY): Payer: Medicare Other

## 2014-02-24 DIAGNOSIS — C3490 Malignant neoplasm of unspecified part of unspecified bronchus or lung: Secondary | ICD-10-CM

## 2014-02-27 ENCOUNTER — Other Ambulatory Visit (HOSPITAL_COMMUNITY): Payer: Self-pay | Admitting: Hematology and Oncology

## 2014-02-27 ENCOUNTER — Encounter (HOSPITAL_COMMUNITY): Payer: Medicare Other | Attending: Hematology and Oncology

## 2014-02-27 ENCOUNTER — Other Ambulatory Visit (HOSPITAL_COMMUNITY): Payer: Self-pay | Admitting: Oncology

## 2014-02-27 ENCOUNTER — Encounter (HOSPITAL_COMMUNITY): Payer: Self-pay | Admitting: Oncology

## 2014-02-27 ENCOUNTER — Ambulatory Visit (HOSPITAL_COMMUNITY)
Admission: RE | Admit: 2014-02-27 | Discharge: 2014-02-27 | Disposition: A | Discharge status: Home / Self Care | Payer: Medicare Other | Source: Ambulatory Visit | Attending: Oncology | Admitting: Oncology

## 2014-02-27 DIAGNOSIS — M810 Age-related osteoporosis without current pathological fracture: Secondary | ICD-10-CM | POA: Insufficient documentation

## 2014-02-27 DIAGNOSIS — C3430 Malignant neoplasm of lower lobe, unspecified bronchus or lung: Secondary | ICD-10-CM

## 2014-02-27 DIAGNOSIS — I7090 Unspecified atherosclerosis: Secondary | ICD-10-CM | POA: Insufficient documentation

## 2014-02-27 DIAGNOSIS — J4489 Other specified chronic obstructive pulmonary disease: Secondary | ICD-10-CM | POA: Insufficient documentation

## 2014-02-27 DIAGNOSIS — Z85118 Personal history of other malignant neoplasm of bronchus and lung: Secondary | ICD-10-CM | POA: Insufficient documentation

## 2014-02-27 DIAGNOSIS — C3490 Malignant neoplasm of unspecified part of unspecified bronchus or lung: Secondary | ICD-10-CM

## 2014-02-27 DIAGNOSIS — M67919 Unspecified disorder of synovium and tendon, unspecified shoulder: Secondary | ICD-10-CM | POA: Insufficient documentation

## 2014-02-27 DIAGNOSIS — R911 Solitary pulmonary nodule: Secondary | ICD-10-CM | POA: Insufficient documentation

## 2014-02-27 DIAGNOSIS — Z09 Encounter for follow-up examination after completed treatment for conditions other than malignant neoplasm: Secondary | ICD-10-CM | POA: Insufficient documentation

## 2014-02-27 DIAGNOSIS — Z902 Acquired absence of lung [part of]: Secondary | ICD-10-CM | POA: Insufficient documentation

## 2014-02-27 DIAGNOSIS — M719 Bursopathy, unspecified: Secondary | ICD-10-CM | POA: Insufficient documentation

## 2014-02-27 DIAGNOSIS — I2510 Atherosclerotic heart disease of native coronary artery without angina pectoris: Secondary | ICD-10-CM | POA: Insufficient documentation

## 2014-02-27 DIAGNOSIS — N189 Chronic kidney disease, unspecified: Secondary | ICD-10-CM | POA: Insufficient documentation

## 2014-02-27 DIAGNOSIS — I129 Hypertensive chronic kidney disease with stage 1 through stage 4 chronic kidney disease, or unspecified chronic kidney disease: Secondary | ICD-10-CM | POA: Insufficient documentation

## 2014-02-27 DIAGNOSIS — E119 Type 2 diabetes mellitus without complications: Secondary | ICD-10-CM | POA: Insufficient documentation

## 2014-02-27 DIAGNOSIS — J449 Chronic obstructive pulmonary disease, unspecified: Secondary | ICD-10-CM | POA: Insufficient documentation

## 2014-02-27 DIAGNOSIS — J438 Other emphysema: Secondary | ICD-10-CM | POA: Insufficient documentation

## 2014-02-27 DIAGNOSIS — R918 Other nonspecific abnormal finding of lung field: Secondary | ICD-10-CM | POA: Insufficient documentation

## 2014-02-27 LAB — COMPREHENSIVE METABOLIC PANEL
ALT: 12 U/L (ref 0–35)
AST: 18 U/L (ref 0–37)
Albumin: 3.7 g/dL (ref 3.5–5.2)
Alkaline Phosphatase: 67 U/L (ref 39–117)
BUN: 19 mg/dL (ref 6–23)
CO2: 30 mEq/L (ref 19–32)
Calcium: 9.9 mg/dL (ref 8.4–10.5)
Chloride: 100 mEq/L (ref 96–112)
Creatinine, Ser: 1.31 mg/dL — ABNORMAL HIGH (ref 0.50–1.10)
GFR calc Af Amer: 44 mL/min — ABNORMAL LOW (ref >90)
GFR calc non Af Amer: 38 mL/min — ABNORMAL LOW (ref >90)
Glucose, Bld: 136 mg/dL — ABNORMAL HIGH (ref 70–99)
Potassium: 4.5 mEq/L (ref 3.7–5.3)
Sodium: 140 mEq/L (ref 137–147)
Total Bilirubin: 0.4 mg/dL (ref 0.3–1.2)
Total Protein: 7.8 g/dL (ref 6.0–8.3)

## 2014-02-27 LAB — CBC WITH DIFFERENTIAL/PLATELET
Basophils Absolute: 0 10*3/uL (ref 0.0–0.1)
Basophils Relative: 0 % (ref 0–1)
Eosinophils Absolute: 0.4 10*3/uL (ref 0.0–0.7)
Eosinophils Relative: 6 % — ABNORMAL HIGH (ref 0–5)
HCT: 43.2 % (ref 36.0–46.0)
Hemoglobin: 13.8 g/dL (ref 12.0–15.0)
Lymphocytes Relative: 26 % (ref 12–46)
Lymphs Abs: 1.7 10*3/uL (ref 0.7–4.0)
MCH: 30.2 pg (ref 26.0–34.0)
MCHC: 31.9 g/dL (ref 30.0–36.0)
MCV: 94.5 fL (ref 78.0–100.0)
Monocytes Absolute: 0.8 10*3/uL (ref 0.1–1.0)
Monocytes Relative: 12 % (ref 3–12)
Neutro Abs: 3.7 10*3/uL (ref 1.7–7.7)
Neutrophils Relative %: 56 % (ref 43–77)
Platelets: 223 10*3/uL (ref 150–400)
RBC: 4.57 MIL/uL (ref 3.87–5.11)
RDW: 14.9 % (ref 11.5–15.5)
WBC: 6.7 10*3/uL (ref 4.0–10.5)

## 2014-02-27 NOTE — Telephone Encounter (Signed)
Called patient and she states she wants to put her surgery on hold for now, until she gets her other medical issues resolved.

## 2014-02-27 NOTE — Progress Notes (Signed)
[facility], MD [address] [place] [address]  Adenocarcinoma of lung  CURRENT THERAPY: Surveillance per NCCN guidelines  INTERVAL HISTORY: Angela Lawson 80 y.o. female returns for  regular  visit for followup of stage I A., non-small cell lung cancer, consistent with a poorly differentiated adenocarcinoma, status post resection 07/07/2008 in [place].  Oncology History   stage I A., non-small cell lung cancer, consistent with a poorly differentiated adenocarcinoma, status post resection 07/07/2008 in [place].       Adenocarcinoma of lung   07/07/2008 Surgery Performed in [place], [place].  Consistent with poorly differentiated adenocarcinoma.   02/27/2014 Imaging CT of chest demonstrating a RLL solid nodular component measuring 9 mm at the site of previous ground-glass opacity. Slight increase in size of an anterior mediastinal node measuring 1.1 cm with stable 1 cm pretracheal lymph node. Consider PET   I personally reviewed and went over laboratory results with the patient.  The results are noted within this dictation.  I personally reviewed and went over radiographic studies with the patient.  The results are noted within this dictation.  CT scan on 02/27/2014 demonstrates the following: Postsurgical change compatible with prior partial left lower lung  resection for lung cancer 2009. Stable 6 mm subpleural nodule over  the lateral left mid to upper lung. Stable nodular right apical  pleural thickening. Site of previously identified ground-glass  opacity in the right lower lobe has a developing solid nodular  component measuring 9 mm. Slight increase in size of an anterior  mediastinal node measuring 1.1 cm with stable 1 cm pretracheal lymph  node. Consider PET-CT for further evaluation of the right lower lobe  nodule and the anterior mediastinal node with concern for metastatic  disease.  As a result, a PET scan is recommended to further  glean the information discussed above.   This exam is to be scheduled in the near future to accommodate the patient's traveling arrangements with her daughter.   She denies any weight loss, decreased appetite, increased shortness of breath, and hemoptysis.    As a side note, she has seen Dr. Aline Brochure for her shoulder pain.  He recommend surgical repair, but it sounds like he put all ortho plans on hold while we work-up the patient's new CT scan findings as mentioned above.    Additionally, her August 2013 bone density exam notes osteoporosis.  I have sent a fax to Dr. Legrand Rams with this information and I will defer intervention to him.  She is presently on Ca and Vit D and she encouraged to continue with this supplementation.  I would recommend Prolia or Reclast for osteoporosis in addition to Ca++ and Vit D, due to their superiority to other agents and to guarantee absorption (compared to oral bisphosphonates).   Past Medical History  Diagnosis Date  . Cancer     left lung/ 2009/ surg only  . Hypertension   . Arthritis   . Diverticulitis   . Diabetes mellitus   . H/O ventral hernia   . Lung cancer   . COPD (chronic obstructive pulmonary disease)   . Osteoporosis   . Adenocarcinoma of lung 08/20/2012    has Adenocarcinoma of lung; Bursitis of left shoulder; Non-traumatic tear of right rotator cuff; Pain in joint, shoulder region; and Muscle weakness (generalized) on her problem list.     has No Known Allergies.  Angela Lawson had no medications administered during this visit.  Past Surgical History  Procedure  Laterality Date  . Cholecystectomy    . Ectopic pregnancy surgery    . Abdominal hysterectomy    . Lung cancer surgery    . Incisional hernia repair N/A 08/26/2013    Procedure: HERNIA REPAIR INCISIONAL WITH MESH;  Surgeon: Jamesetta So, MD;  Location: [facility];  Service: General;  Laterality: N/A;    Denies any headaches, dizziness, double vision, fevers, chills, night sweats,  nausea, vomiting, diarrhea, constipation, chest pain, heart palpitations, shortness of breath, blood in stool, black tarry stool, urinary pain, urinary burning, urinary frequency, hematuria.   PHYSICAL EXAMINATION  ECOG PERFORMANCE STATUS: 0 - Asymptomatic  Filed Vitals:   03/01/14 1000  BP: 133/79  Pulse: 77  Temp: 98 F (36.7 C)  Resp: 20    GENERAL:alert, no distress, well nourished, well developed, comfortable, cooperative and smiling SKIN: skin color, texture, turgor are normal, no rashes or significant lesions HEAD: Normocephalic, No masses, lesions, tenderness or abnormalities EYES: normal, PERRLA, EOMI, Conjunctiva are pink and non-injected EARS: External ears normal OROPHARYNX:mucous membranes are moist  NECK: supple, no adenopathy, thyroid normal size, non-tender, without nodularity, no stridor, non-tender, trachea midline LYMPH:  not examined BREAST:not examined LUNGS: clear to auscultation , decreased breath sounds HEART: regular rate & rhythm, no murmurs and no gallops ABDOMEN:abdomen soft and normal bowel sounds BACK: Back symmetric, no curvature. EXTREMITIES:less then 2 second capillary refill, no skin discoloration  NEURO: alert & oriented x 3 with fluent speech, no focal motor/sensory deficits, gait normal   LABORATORY DATA: CBC    Component Value Date/Time   WBC 6.7 02/27/2014 0837   RBC 4.57 02/27/2014 0837   HGB 13.8 02/27/2014 0837   HCT 43.2 02/27/2014 0837   PLT 223 02/27/2014 0837   MCV 94.5 02/27/2014 0837   MCH 30.2 02/27/2014 0837   MCHC 31.9 02/27/2014 0837   RDW 14.9 02/27/2014 0837   LYMPHSABS 1.7 02/27/2014 0837   MONOABS 0.8 02/27/2014 0837   EOSABS 0.4 02/27/2014 0837   BASOSABS 0.0 02/27/2014 0837      Chemistry      Component Value Date/Time   NA 140 02/27/2014 0837   K 4.5 02/27/2014 0837   CL 100 02/27/2014 0837   CO2 30 02/27/2014 0837   BUN 19 02/27/2014 0837   CREATININE 1.31* 02/27/2014 0837      Component Value Date/Time   CALCIUM  9.9 02/27/2014 0837   CALCIUM 10.0 08/20/2012 1408   ALKPHOS 67 02/27/2014 0837   AST 18 02/27/2014 0837   ALT 12 02/27/2014 0837   BILITOT 0.4 02/27/2014 0837        RADIOGRAPHIC STUDIES:  02/27/2014  CLINICAL DATA: Followup pulmonary nodules. History of lung cancer  post surgery 2009.  EXAM:  CT CHEST WITHOUT CONTRAST  TECHNIQUE:  Multidetector CT imaging of the chest was performed following the  standard protocol without IV contrast.  COMPARISON: 05/26/2013 and 02/18/2013  FINDINGS:  Lungs are adequately inflated. There is mild nodular right apical  pleural thickening unchanged. There is mild stable emphysematous  disease. Postsurgical change compatible prior partial left lower  lung resection. There is a stable 6 mm subpleural nodule over the  lateral left upper lung. Minimal scarring in the left base. The  previously noted ground-glass nodularity in the right lower lobe has  a more prominent developing solid component measuring 9 mm. No new  nodules are identified. No evidence of pleural effusion.  Heart is within normal in size. There is calcification over the left  anterior  descending coronary artery as well as calcified plaque over  the thoracic aorta. There is a 1 cm pretracheal lymph node without  significant change. There is a lymph node over the anterior  mediastinum just left of the sternum measuring 1.1 x 1.1 cm slightly  larger.  Images through the upper abdomen are unremarkable. There is moderate  degenerative change of the spine.  IMPRESSION:  Postsurgical change compatible with prior partial left lower lung  resection for lung cancer 2009. Stable 6 mm subpleural nodule over  the lateral left mid to upper lung. Stable nodular right apical  pleural thickening. Site of previously identified ground-glass  opacity in the right lower lobe has a developing solid nodular  component measuring 9 mm. Slight increase in size of an anterior  mediastinal node measuring 1.1  cm with stable 1 cm pretracheal lymph  node. Consider PET-CT for further evaluation of the right lower lobe  nodule and the anterior mediastinal node with concern for metastatic  disease.  Atherosclerotic coronary artery disease.  Emphysematous disease.  Electronically Signed  By: Marin Olp M.D.  On: 02/27/2014 11:20     ASSESSMENT:  1. Stage I A., non-small cell lung cancer, consistent with a poorly differentiated adenocarcinoma, status post resection 07/07/2008 in [place]. 2. COPD, followed by Dr. Luan Pulling. 3. Diabetes mellitus 4. Hypertension 5. Osteoporosis on bone density exam in August 2013.  Will defer treatment to PCP.  Next bone density is due in August 2015.  6. Chronic renal disease,  G3b with GFR of 44 on 02/27/2014 7. Umbilical hernia, repaired by Dr. Arnoldo Morale with mesh on 08/26/2013 8. Right shoulder supraspinatus and infraspinatus complete tears, requiring surgical intervention per Dr. Aline Brochure.  Patient Active Problem List   Diagnosis Date Noted  . Non-traumatic tear of right rotator cuff 11/30/2013  . Pain in joint, shoulder region 11/30/2013  . Muscle weakness (generalized) 11/30/2013  . Bursitis of left shoulder 02/09/2013  . Adenocarcinoma of lung 08/20/2012     PLAN:  1. I personally reviewed and went over laboratory results with the patient.  The results are noted within this dictation. 2. I personally reviewed and went over radiographic studies with the patient.  The results are noted within this dictation.   3. PET scan on 4/9 4. Follow-up with Dr. Aline Brochure as directed for right shoulder injury 5. Follow-up with Dr. Luan Pulling for COPD as directed 6. Defer treatment of Osteoporosis to Dr. Legrand Rams (PCP).  Recommendations made above.  7. Return in 2-3 weeks following PET scan to review results.    THERAPY PLAN:  Unfortunately, Rudean's most recent surveillance CT scan demonstrates a 9 mm nodule in the right lower lobe with some borderline enlarged  lymphadenopathy.  We will perform a PET scan to further delineate these abnormalities.  We will see her back following this test and review results and make recommendations moving forward.   All questions were answered. The patient knows to call the clinic with any problems, questions or concerns. We can certainly see the patient much sooner if necessary.  Patient and plan discussed with Dr. Farrel Gobble and he is in agreement with the aforementioned.   KEFALAS,THOMAS 03/01/2014

## 2014-02-27 NOTE — Progress Notes (Signed)
Labs drawn today for cbc/diff,cmp 

## 2014-03-01 ENCOUNTER — Encounter (HOSPITAL_BASED_OUTPATIENT_CLINIC_OR_DEPARTMENT_OTHER): Payer: Medicare Other | Admitting: Oncology

## 2014-03-01 ENCOUNTER — Encounter (HOSPITAL_COMMUNITY): Payer: Self-pay | Admitting: Oncology

## 2014-03-01 VITALS — BP 133/79 | HR 77 | Temp 98.0°F | Resp 20 | Wt 176.0 lb

## 2014-03-01 DIAGNOSIS — E119 Type 2 diabetes mellitus without complications: Secondary | ICD-10-CM

## 2014-03-01 DIAGNOSIS — I10 Essential (primary) hypertension: Secondary | ICD-10-CM

## 2014-03-01 DIAGNOSIS — Z85118 Personal history of other malignant neoplasm of bronchus and lung: Secondary | ICD-10-CM

## 2014-03-01 DIAGNOSIS — C3490 Malignant neoplasm of unspecified part of unspecified bronchus or lung: Secondary | ICD-10-CM

## 2014-03-01 DIAGNOSIS — J449 Chronic obstructive pulmonary disease, unspecified: Secondary | ICD-10-CM

## 2014-03-01 DIAGNOSIS — R911 Solitary pulmonary nodule: Secondary | ICD-10-CM

## 2014-03-01 DIAGNOSIS — N189 Chronic kidney disease, unspecified: Secondary | ICD-10-CM

## 2014-03-01 DIAGNOSIS — M810 Age-related osteoporosis without current pathological fracture: Secondary | ICD-10-CM

## 2014-03-01 NOTE — Patient Instructions (Signed)
[facility] Discharge Instructions  RECOMMENDATIONS MADE BY THE CONSULTANT AND ANY TEST RESULTS WILL BE SENT TO YOUR REFERRING PHYSICIAN.  Return for office visit after having PET scan.  Thank you for choosing [facility] to provide your oncology and hematology care.  To afford each patient quality time with our providers, please arrive at least 15 minutes before your scheduled appointment time.  With your help, our goal is to use those 15 minutes to complete the necessary work-up to ensure our physicians have the information they need to help with your evaluation and healthcare recommendations.    Effective January 1st, 2014, we ask that you re-schedule your appointment with our physicians should you arrive 10 or more minutes late for your appointment.  We strive to give you quality time with our providers, and arriving late affects you and other patients whose appointments are after yours.    Again, thank you for choosing [facility].  Our hope is that these requests will decrease the amount of time that you wait before being seen by our physicians.       _____________________________________________________________  Should you have questions after your visit to [facility], please contact our office at [phone] between the hours of 8:30 a.m. and 5:00 p.m.  Voicemails left after 4:30 p.m. will not be returned until the following business day.  For prescription refill requests, have your pharmacy contact our office with your prescription refill request.

## 2014-03-14 ENCOUNTER — Ambulatory Visit (HOSPITAL_COMMUNITY): Payer: Medicare Other

## 2014-03-16 ENCOUNTER — Other Ambulatory Visit (HOSPITAL_COMMUNITY): Payer: Self-pay | Admitting: Oncology

## 2014-03-16 ENCOUNTER — Encounter (HOSPITAL_COMMUNITY)
Admission: RE | Admit: 2014-03-16 | Discharge: 2014-03-16 | Disposition: A | Discharge status: Home / Self Care | Payer: Medicare Other | Source: Ambulatory Visit | Attending: Oncology | Admitting: Oncology

## 2014-03-16 DIAGNOSIS — C3490 Malignant neoplasm of unspecified part of unspecified bronchus or lung: Secondary | ICD-10-CM

## 2014-03-16 LAB — GLUCOSE, CAPILLARY: Glucose-Capillary: 134 mg/dL — ABNORMAL HIGH (ref 70–99)

## 2014-03-16 MED ORDER — FLUDEOXYGLUCOSE F - 18 (FDG) INJECTION
9.4000 | Freq: Once | INTRAVENOUS | Status: AC | PRN
  Administered 2014-03-16: 9.4 via INTRAVENOUS

## 2014-03-22 ENCOUNTER — Ambulatory Visit (HOSPITAL_COMMUNITY): Payer: Medicare Other

## 2014-03-26 NOTE — Progress Notes (Signed)
[facility], MD [address] [place] [address]  Adenocarcinoma of lung  Abnormal positron emission tomography (PET) scan - Plan: MM Digital Diagnostic Bilat, MR Breast Bilateral Wo Contrast  CURRENT THERAPY: Surveillance per NCCN guidelines  INTERVAL HISTORY: Angela Lawson 80 y.o. female returns for  regular  visit for followup of stage I A., non-small cell lung cancer, consistent with a poorly differentiated adenocarcinoma, status post resection 07/07/2008 in [place].  Oncology History   stage I A., non-small cell lung cancer, consistent with a poorly differentiated adenocarcinoma, status post resection 07/07/2008 in [place].       Adenocarcinoma of lung   07/07/2008 Surgery Performed in [place], [place].  Consistent with poorly differentiated adenocarcinoma.   02/27/2014 Imaging CT of chest demonstrating a RLL solid nodular component measuring 9 mm at the site of previous ground-glass opacity. Slight increase in size of an anterior mediastinal node measuring 1.1 cm with stable 1 cm pretracheal lymph node. Consider PET   03/20/2014 Imaging PET scan- No hypermetabolic pulmonary lesions to suggest recurrent or metastatic pulmonary disease.  Weakly positive left internal mammary lymph node and left axillary lymph node (? inflammatory process involving the left breast).   I personally reviewed and went over radiographic studies with the patient.  The results are noted within this dictation.  PET scan on 03/20/2014 is negative for any clear evidence for malignancy: 1. No hypermetabolic pulmonary lesions to suggest recurrent or  metastatic pulmonary disease. Recommend continued CT surveillance.  2. No hypermetabolic mediastinal or hilar lymph nodes. Weakly  positive left internal mammary lymph node and left axillary lymph  node possibly due to inflammatory process involving the left breast.  Slight asymmetric increased FDG uptake in the left breast tissue    diffusely.  3. No findings for metastatic disease involving the neck, abdomen or  pelvis.  With this information, she needs bilateral diagnostic mammogram with bilateral MRI of breast.   Her last mammogram was "quite a while ago."  She denies any complaints associated with her breasts.  She denies any new lumps or bumps or masses or nodules in her breast.  She denies any nipple discharge as well.   From an oncology standpoint, we need to work-up her PET scan findings further.   Past Medical History  Diagnosis Date  . Cancer     left lung/ 2009/ surg only  . Hypertension   . Arthritis   . Diverticulitis   . Diabetes mellitus   . H/O ventral hernia   . Lung cancer   . COPD (chronic obstructive pulmonary disease)   . Osteoporosis   . Adenocarcinoma of lung 08/20/2012    has Adenocarcinoma of lung; Bursitis of left shoulder; Non-traumatic tear of right rotator cuff; Pain in joint, shoulder region; and Muscle weakness (generalized) on her problem list.     has No Known Allergies.  Ms. Shealy does not currently have medications on file.  Past Surgical History  Procedure Laterality Date  . Cholecystectomy    . Ectopic pregnancy surgery    . Abdominal hysterectomy    . Lung cancer surgery    . Incisional hernia repair N/A 08/26/2013    Procedure: HERNIA REPAIR INCISIONAL WITH MESH;  Surgeon: Jamesetta So, MD;  Location: [facility];  Service: General;  Laterality: N/A;    Denies any headaches, dizziness, double vision, fevers, chills, night sweats, nausea, vomiting, diarrhea, constipation, chest pain, heart palpitations, shortness of breath, blood in stool, black tarry stool, urinary  pain, urinary burning, urinary frequency, hematuria.   PHYSICAL EXAMINATION  ECOG PERFORMANCE STATUS: 1 - Symptomatic but completely ambulatory  Filed Vitals:   03/27/14 1318  BP: 115/68  Pulse: 92  Temp: 98.7 F (37.1 C)  Resp: 21    GENERAL:alert, no distress, well nourished, well developed,  comfortable, cooperative and smiling SKIN: skin color, texture, turgor are normal, no rashes or significant lesions HEAD: Normocephalic, No masses, lesions, tenderness or abnormalities EYES: normal, PERRLA, EOMI, Conjunctiva are pink and non-injected EARS: External ears normal OROPHARYNX:mucous membranes are moist  NECK: supple, no adenopathy, thyroid normal size, non-tender, without nodularity, no stridor, non-tender, trachea midline LYMPH:  no palpable lymphadenopathy BREAST: right breast normal without mass, skin or nipple changes or axillary nodes, left breast is larger with thicker mammary tissue a medial area that is thickened in the 7-8 o'clock position.  No obvious lymphadenopathy in the left axilla noted. LUNGS: expiratory rhonchi throughout with severely diminished breath sounds bilaterally. HEART: regular rate & rhythm, no murmurs, no gallops, S1 normal and S2 normal ABDOMEN:abdomen soft, non-tender and normal bowel sounds BACK: Back symmetric, no curvature. EXTREMITIES:less then 2 second capillary refill, no joint deformities, effusion, or inflammation, no skin discoloration, no clubbing, no cyanosis  NEURO: alert & oriented x 3 with fluent speech, no focal motor/sensory deficits, gait normal    LABORATORY DATA: CBC    Component Value Date/Time   WBC 6.7 02/27/2014 0837   RBC 4.57 02/27/2014 0837   HGB 13.8 02/27/2014 0837   HCT 43.2 02/27/2014 0837   PLT 223 02/27/2014 0837   MCV 94.5 02/27/2014 0837   MCH 30.2 02/27/2014 0837   MCHC 31.9 02/27/2014 0837   RDW 14.9 02/27/2014 0837   LYMPHSABS 1.7 02/27/2014 0837   MONOABS 0.8 02/27/2014 0837   EOSABS 0.4 02/27/2014 0837   BASOSABS 0.0 02/27/2014 0837      Chemistry      Component Value Date/Time   NA 140 02/27/2014 0837   K 4.5 02/27/2014 0837   CL 100 02/27/2014 0837   CO2 30 02/27/2014 0837   BUN 19 02/27/2014 0837   CREATININE 1.31* 02/27/2014 0837      Component Value Date/Time   CALCIUM 9.9 02/27/2014 0837   CALCIUM  10.0 08/20/2012 1408   ALKPHOS 67 02/27/2014 0837   AST 18 02/27/2014 0837   ALT 12 02/27/2014 0837   BILITOT 0.4 02/27/2014 0837       RADIOGRAPHIC STUDIES:  03/20/2014  CLINICAL DATA: Subsequent treatment strategy for non-small cell  lung cancer.  EXAM:  NUCLEAR MEDICINE PET SKULL BASE TO THIGH  TECHNIQUE:  9.4 mCi F-18 FDG was injected intravenously. Full-ring PET imaging  was performed from the skull base to thigh after the radiotracer. CT  data was obtained and used for attenuation correction and anatomic  localization.  FASTING BLOOD GLUCOSE: Value: 134 mg/dl  COMPARISON: Multiple prior chest CTs. The most recent is  02/27/2014.  FINDINGS:  NECK  No hypermetabolic lymph nodes in the neck.  CHEST  Slight asymmetric increased uptake is noted in the left breast  tissue. This is not a focal area but more diffuse. Could not exclude  on inflammatory process such as mastitis. There is also mildly  hypermetabolic left internal mammary lymph node which is not changed  since prior CT scans in size. SUV max is 2.7. There is also weakly  positive left axillary lymph node with SUV max of 1.7. These may  reflect inflammatory changes also.  No abnormal FDG  uptake is identified in the right lower lobe lung  nodule. The 6.5 mm left upper lobe lung nodule is not hypermetabolic  and the right apical density is not hypermetabolic. Stable  underlying emphysematous changes. Stable left basilar scarring  changes.  ABDOMEN/PELVIS  No abnormal hypermetabolic activity within the liver, pancreas,  adrenal glands, or spleen. No hypermetabolic lymph nodes in the  abdomen or pelvis.  SKELETON  No focal hypermetabolic activity to suggest skeletal metastasis.  IMPRESSION:  1. No hypermetabolic pulmonary lesions to suggest recurrent or  metastatic pulmonary disease. Recommend continued CT surveillance.  2. No hypermetabolic mediastinal or hilar lymph nodes. Weakly  positive left internal mammary  lymph node and left axillary lymph  node possibly due to inflammatory process involving the left breast.  Slight asymmetric increased FDG uptake in the left breast tissue  diffusely.  3. No findings for metastatic disease involving the neck, abdomen or  pelvis.  Electronically Signed  By: Kalman Jewels M.D.  On: 03/16/2014 10:52    ASSESSMENT:  1. Stage I A., non-small cell lung cancer, consistent with a poorly differentiated adenocarcinoma, status post resection 07/07/2008 in [place].  2. COPD, followed by Dr. Luan Pulling.  3. Diabetes mellitus  4. Hypertension  5. Osteoporosis on bone density exam in August 2013. Will defer treatment to PCP. Next bone density is due in August 2015.  6. Chronic renal disease, G3b with GFR of 44 on 02/27/2014  7. Umbilical hernia, repaired by Dr. Arnoldo Morale with mesh on 08/26/2013  8. Right shoulder supraspinatus and infraspinatus complete tears, requiring surgical intervention per Dr. Aline Brochure. 9. Left breast abnormality requiring imaging studies.  Patient Active Problem List   Diagnosis Date Noted  . Non-traumatic tear of right rotator cuff 11/30/2013  . Pain in joint, shoulder region 11/30/2013  . Muscle weakness (generalized) 11/30/2013  . Bursitis of left shoulder 02/09/2013  . Adenocarcinoma of lung 08/20/2012     PLAN:  1. I personally reviewed and went over radiographic studies with the patient.  The results are noted within this dictation.   2. Bilateral diagnostic mammogram 3. Bilateral MRI of breasts 4. Return in 2-3 weeks following mammogram and MRI of breasts.  If imaging studies are negative, we will repeat CT of chest in 6 months and if that is negative, will return to NCCN guidelines pertaining to surveillance recommendations for NSCLC.   THERAPY PLAN:  PET scan is negative for any recurrence, but there are some breast asymmetry and therefore I recommend diagnostic mammogram and MRI of breasts.  We will repeat CT of chest  with contrast in 6 months to follow-up on pulmonary nodule and lymph nodes.  If negative, we will return to NCCN guidelines for surveillance of NSCLC.  NCCN guidelines for Non-Small Cell Lung Cancer Surveillance are as follows:  A. H+P every 6-12 months and CT of chest every 6 months for the first two years  B. Low-dose spiral CT chest annually after first two years of surveillance CTs   C. PET scan not typically indicated for surveillance  D. Smoking cessation advice, counseling, and pharmacotherapy  All questions were answered. The patient knows to call the clinic with any problems, questions or concerns. We can certainly see the patient much sooner if necessary.  Patient and plan discussed with Dr. Farrel Gobble and he is in agreement with the aforementioned.   [facility] 03/27/2014

## 2014-03-27 ENCOUNTER — Encounter (HOSPITAL_COMMUNITY): Payer: Self-pay | Admitting: Oncology

## 2014-03-27 ENCOUNTER — Encounter (HOSPITAL_COMMUNITY): Payer: Medicare Other | Attending: Hematology and Oncology | Admitting: Oncology

## 2014-03-27 VITALS — BP 115/68 | HR 92 | Temp 98.7°F | Resp 21 | Wt 176.3 lb

## 2014-03-27 DIAGNOSIS — R948 Abnormal results of function studies of other organs and systems: Secondary | ICD-10-CM | POA: Insufficient documentation

## 2014-03-27 DIAGNOSIS — Z85118 Personal history of other malignant neoplasm of bronchus and lung: Secondary | ICD-10-CM

## 2014-03-27 DIAGNOSIS — M810 Age-related osteoporosis without current pathological fracture: Secondary | ICD-10-CM

## 2014-03-27 DIAGNOSIS — N6489 Other specified disorders of breast: Secondary | ICD-10-CM

## 2014-03-27 DIAGNOSIS — C3490 Malignant neoplasm of unspecified part of unspecified bronchus or lung: Secondary | ICD-10-CM

## 2014-03-27 NOTE — Patient Instructions (Signed)
[place] Discharge Instructions  RECOMMENDATIONS MADE BY THE CONSULTANT AND ANY TEST RESULTS WILL BE SENT TO YOUR REFERRING PHYSICIAN.  EXAM FINDINGS BY THE PHYSICIAN TODAY AND SIGNS OR SYMPTOMS TO REPORT TO CLINIC OR PRIMARY PHYSICIAN: Exam and findings as discussed by Robynn Pane, PA-C.  Need to do mammogram and MRI if your breast because of the abnormal results seen on PET.  MEDICATIONS PRESCRIBED:  none  INSTRUCTIONS/FOLLOW-UP: Follow-up after Mammogram and MRI of breasts.   Thank you for choosing [facility] to provide your oncology and hematology care.  To afford each patient quality time with our providers, please arrive at least 15 minutes before your scheduled appointment time.  With your help, our goal is to use those 15 minutes to complete the necessary work-up to ensure our physicians have the information they need to help with your evaluation and healthcare recommendations.    Effective January 1st, 2014, we ask that you re-schedule your appointment with our physicians should you arrive 10 or more minutes late for your appointment.  We strive to give you quality time with our providers, and arriving late affects you and other patients whose appointments are after yours.    Again, thank you for choosing [facility].  Our hope is that these requests will decrease the amount of time that you wait before being seen by our physicians.       _____________________________________________________________  Should you have questions after your visit to [facility] [place], please contact our office at [phone] between the hours of 8:30 a.m. and 5:00 p.m.  Voicemails left after 4:30 p.m. will not be returned until the following business day.  For prescription refill requests, have your pharmacy contact our office with your prescription refill request.

## 2014-04-03 ENCOUNTER — Emergency Department (HOSPITAL_COMMUNITY)
Admission: EM | Admit: 2014-04-03 | Discharge: 2014-04-03 | Disposition: A | Discharge status: Home / Self Care | Payer: Medicare Other | Attending: Emergency Medicine | Admitting: Emergency Medicine

## 2014-04-03 ENCOUNTER — Emergency Department (HOSPITAL_COMMUNITY): Payer: Medicare Other

## 2014-04-03 ENCOUNTER — Encounter (HOSPITAL_COMMUNITY): Payer: Self-pay | Admitting: Emergency Medicine

## 2014-04-03 DIAGNOSIS — Z8719 Personal history of other diseases of the digestive system: Secondary | ICD-10-CM | POA: Insufficient documentation

## 2014-04-03 DIAGNOSIS — Z85118 Personal history of other malignant neoplasm of bronchus and lung: Secondary | ICD-10-CM | POA: Insufficient documentation

## 2014-04-03 DIAGNOSIS — Z79899 Other long term (current) drug therapy: Secondary | ICD-10-CM | POA: Insufficient documentation

## 2014-04-03 DIAGNOSIS — J449 Chronic obstructive pulmonary disease, unspecified: Secondary | ICD-10-CM

## 2014-04-03 DIAGNOSIS — Z87891 Personal history of nicotine dependence: Secondary | ICD-10-CM | POA: Insufficient documentation

## 2014-04-03 DIAGNOSIS — I10 Essential (primary) hypertension: Secondary | ICD-10-CM | POA: Insufficient documentation

## 2014-04-03 DIAGNOSIS — M810 Age-related osteoporosis without current pathological fracture: Secondary | ICD-10-CM | POA: Insufficient documentation

## 2014-04-03 DIAGNOSIS — Z8739 Personal history of other diseases of the musculoskeletal system and connective tissue: Secondary | ICD-10-CM | POA: Insufficient documentation

## 2014-04-03 DIAGNOSIS — J441 Chronic obstructive pulmonary disease with (acute) exacerbation: Secondary | ICD-10-CM | POA: Insufficient documentation

## 2014-04-03 DIAGNOSIS — E119 Type 2 diabetes mellitus without complications: Secondary | ICD-10-CM | POA: Insufficient documentation

## 2014-04-03 DIAGNOSIS — Z7982 Long term (current) use of aspirin: Secondary | ICD-10-CM | POA: Insufficient documentation

## 2014-04-03 LAB — CBC WITH DIFFERENTIAL/PLATELET
Basophils Absolute: 0 10*3/uL (ref 0.0–0.1)
Basophils Relative: 0 % (ref 0–1)
Eosinophils Absolute: 0.8 10*3/uL — ABNORMAL HIGH (ref 0.0–0.7)
Eosinophils Relative: 10 % — ABNORMAL HIGH (ref 0–5)
HCT: 43.3 % (ref 36.0–46.0)
Hemoglobin: 14 g/dL (ref 12.0–15.0)
Lymphocytes Relative: 36 % (ref 12–46)
Lymphs Abs: 2.9 10*3/uL (ref 0.7–4.0)
MCH: 30.5 pg (ref 26.0–34.0)
MCHC: 32.3 g/dL (ref 30.0–36.0)
MCV: 94.3 fL (ref 78.0–100.0)
Monocytes Absolute: 1 10*3/uL (ref 0.1–1.0)
Monocytes Relative: 12 % (ref 3–12)
Neutro Abs: 3.4 10*3/uL (ref 1.7–7.7)
Neutrophils Relative %: 42 % — ABNORMAL LOW (ref 43–77)
Platelets: 235 10*3/uL (ref 150–400)
RBC: 4.59 MIL/uL (ref 3.87–5.11)
RDW: 14.6 % (ref 11.5–15.5)
WBC: 8.1 10*3/uL (ref 4.0–10.5)

## 2014-04-03 LAB — BASIC METABOLIC PANEL
BUN: 19 mg/dL (ref 6–23)
CO2: 30 mEq/L (ref 19–32)
Calcium: 10.1 mg/dL (ref 8.4–10.5)
Chloride: 96 mEq/L (ref 96–112)
Creatinine, Ser: 1.42 mg/dL — ABNORMAL HIGH (ref 0.50–1.10)
GFR calc Af Amer: 40 mL/min — ABNORMAL LOW (ref >90)
GFR calc non Af Amer: 34 mL/min — ABNORMAL LOW (ref >90)
Glucose, Bld: 102 mg/dL — ABNORMAL HIGH (ref 70–99)
Potassium: 4.8 mEq/L (ref 3.7–5.3)
Sodium: 137 mEq/L (ref 137–147)

## 2014-04-03 MED ORDER — IPRATROPIUM-ALBUTEROL 0.5-2.5 (3) MG/3ML IN SOLN: 3.0000 mL | Freq: Once | RESPIRATORY_TRACT | Status: DC

## 2014-04-03 MED ORDER — ALBUTEROL SULFATE (2.5 MG/3ML) 0.083% IN NEBU
INHALATION_SOLUTION | RESPIRATORY_TRACT | Status: AC
  Filled 2014-04-03: qty 3

## 2014-04-03 MED ORDER — IPRATROPIUM-ALBUTEROL 0.5-2.5 (3) MG/3ML IN SOLN
3.0000 mL | Freq: Once | RESPIRATORY_TRACT | Status: AC
  Administered 2014-04-03: 3 mL via RESPIRATORY_TRACT

## 2014-04-03 MED ORDER — IPRATROPIUM-ALBUTEROL 0.5-2.5 (3) MG/3ML IN SOLN
RESPIRATORY_TRACT | Status: DC
Start: 2014-04-03 — End: 2014-04-04
  Filled 2014-04-03: qty 3

## 2014-04-03 MED ORDER — LEVOFLOXACIN 500 MG PO TABS: 500.0000 mg | ORAL_TABLET | Freq: Every day | ORAL | Status: DC

## 2014-04-03 MED ORDER — PREDNISONE 20 MG PO TABS: ORAL_TABLET | ORAL | Status: DC

## 2014-04-03 MED ORDER — LEVOFLOXACIN 500 MG PO TABS
500.0000 mg | ORAL_TABLET | Freq: Once | ORAL | Status: AC
  Administered 2014-04-03: 500 mg via ORAL
  Filled 2014-04-03: qty 1

## 2014-04-03 MED ORDER — ALBUTEROL SULFATE (2.5 MG/3ML) 0.083% IN NEBU
2.5000 mg | INHALATION_SOLUTION | Freq: Once | RESPIRATORY_TRACT | Status: AC
  Administered 2014-04-03: 2.5 mg via RESPIRATORY_TRACT

## 2014-04-03 MED ORDER — PREDNISONE 50 MG PO TABS
60.0000 mg | ORAL_TABLET | Freq: Once | ORAL | Status: AC
  Administered 2014-04-03: 60 mg via ORAL
  Filled 2014-04-03 (×2): qty 1

## 2014-04-03 NOTE — ED Provider Notes (Signed)
CSN: [id number]     Arrival date & time 04/03/14  2008 History  This chart was scribed for Maudry Diego, MD by Marcha Dutton, ED Scribe. This patient was seen in room [facility] and the patient's care was started at 8:34 PM.    Chief Complaint  Patient presents with  . Shortness of Breath    Patient is a 80 y.o. female presenting with shortness of breath. The history is provided by the patient and a relative. No language interpreter was used.  Shortness of Breath Severity:  Mild Onset quality:  Gradual Timing:  Constant Progression:  Unchanged Chronicity:  Recurrent Context: activity   Relieved by:  Rest Worsened by:  Exertion and movement Ineffective treatments:  Lying down and rest Associated symptoms: no abdominal pain, no chest pain, no cough, no headaches and no rash   Risk factors: hx of cancer   Risk factors comment:  H/o COPD and lung Cancer. Surgical h/o lung cancer.    Past Medical History  Diagnosis Date  . Cancer     left lung/ 2009/ surg only  . Hypertension   . Arthritis   . Diverticulitis   . Diabetes mellitus   . H/O ventral hernia   . Lung cancer   . COPD (chronic obstructive pulmonary disease)   . Osteoporosis   . Adenocarcinoma of lung 08/20/2012    Past Surgical History  Procedure Laterality Date  . Cholecystectomy    . Ectopic pregnancy surgery    . Abdominal hysterectomy    . Lung cancer surgery    . Incisional hernia repair N/A 08/26/2013    Procedure: HERNIA REPAIR INCISIONAL WITH MESH;  Surgeon: Jamesetta So, MD;  Location: [facility];  Service: General;  Laterality: N/A;    Family History  Problem Relation Age of Onset  . Diabetes Mother   . Hypertension Mother   . Diabetes Father   . Asthma    . Cancer      History  Substance Use Topics  . Smoking status: Former Research scientist (life sciences)  . Smokeless tobacco: Never Used  . Alcohol Use: No    OB History   Grav Para Term Preterm Abortions TAB SAB Ect Mult Living                   Review of Systems  Constitutional: Negative for appetite change and fatigue.  HENT: Positive for rhinorrhea and sneezing. Negative for congestion, ear discharge and sinus pressure.   Eyes: Negative for discharge.  Respiratory: Positive for choking and shortness of breath. Negative for cough.   Cardiovascular: Negative for chest pain.  Gastrointestinal: Negative for abdominal pain and diarrhea.  Genitourinary: Negative for frequency and hematuria.  Musculoskeletal: Negative for back pain.  Skin: Negative for rash.  Neurological: Negative for seizures and headaches.  Psychiatric/Behavioral: Negative for hallucinations.      Allergies  Review of patient's allergies indicates no known allergies.  Home Medications    Prior to Admission medications   Medication Sig Start Date End Date Taking? Authorizing Provider  acetaminophen-codeine (TYLENOL #3) 300-30 MG per tablet Take 1 tablet by mouth every 6 (six) hours as needed for moderate pain. 11/16/13   Carole Civil, MD  albuterol (PROVENTIL HFA;VENTOLIN HFA) 108 (90 BASE) MCG/ACT inhaler Inhale 2 puffs into the lungs every 6 (six) hours as needed. Shortness of Breath    Historical Provider, MD  ARCAPTA NEOHALER 75 MCG CAPS Place 1 puff into inhaler and inhale daily. 01/31/14   Historical  Provider, MD  aspirin EC 81 MG tablet Take 81 mg by mouth daily.    Historical Provider, MD  benazepril-hydrochlorthiazide (LOTENSIN HCT) 20-12.5 MG per tablet Take 0.5 tablets by mouth daily.     Historical Provider, MD  buPROPion (WELLBUTRIN SR) 150 MG 12 hr tablet Take 150 mg by mouth 2 (two) times daily.    Historical Provider, MD  Calcium Carbonate-Vitamin D (CALCIUM 600 + D PO) Take 1 tablet by mouth daily.    Historical Provider, MD  denosumab (PROLIA) 60 MG/ML SOLN injection Inject 60 mg into the skin every 6 (six) months. Administer in upper arm, thigh, or abdomen    Historical Provider, MD  diphenoxylate-atropine (LOMOTIL) 2.5-0.025 MG  per tablet Take 1 tablet by mouth 4 (four) times daily as needed for diarrhea or loose stools. 11/22/13   Nat Christen, MD  fish oil-omega-3 fatty acids 1000 MG capsule Take 1 g by mouth daily.    Historical Provider, MD  gabapentin (NEURONTIN) 300 MG capsule Take 300 mg by mouth daily.     Historical Provider, MD  Garlic TABS Take 1 tablet by mouth daily.    Historical Provider, MD  meclizine (ANTIVERT) 25 MG tablet Take 25 mg by mouth 4 (four) times daily as needed. Dizziness    Historical Provider, MD  omeprazole (PRILOSEC) 20 MG capsule Take 20 mg by mouth daily.    Historical Provider, MD  promethazine (PHENERGAN) 25 MG tablet Take 1 tablet (25 mg total) by mouth every 6 (six) hours as needed for nausea or vomiting. 11/22/13   Nat Christen, MD  TRADJENTA 5 MG TABS tablet Take 5 mg by mouth daily.  08/10/12   Historical Provider, MD  traMADol-acetaminophen (ULTRACET) 37.5-325 MG per tablet Take 1 tablet by mouth every 6 (six) hours as needed. 03/17/14   Historical Provider, MD    Triage Vitals: BP 175/66  Temp(Src) 98.9 F (37.2 C) (Oral)  Resp 22  Ht 5\' 4"  (1.626 m)  Wt 172 lb (78.019 kg)  BMI 29.51 kg/m2  SpO2 92%   Physical Exam  Nursing note and vitals reviewed. Constitutional: She is oriented to person, place, and time. She appears well-developed.  HENT:  Head: Normocephalic.  Eyes: Conjunctivae and EOM are normal. No scleral icterus.  Neck: Neck supple. No thyromegaly present.  Cardiovascular: Normal rate and regular rhythm.  Exam reveals no gallop and no friction rub.   No murmur heard. Pulmonary/Chest: No stridor. She has wheezes (mild bilaterally). She has no rales. She exhibits no tenderness.  Abdominal: She exhibits no distension. There is no tenderness. There is no rebound.  Musculoskeletal: Normal range of motion. She exhibits no edema.  Lymphadenopathy:    She has no cervical adenopathy.  Neurological: She is oriented to person, place, and time. She exhibits normal  muscle tone. Coordination normal.  Skin: No rash noted. No erythema.  Psychiatric: She has a normal mood and affect. Her behavior is normal.     ED Course  Procedures (including critical care time)  DIAGNOSTIC STUDIES: Oxygen Saturation is 94% on RA, low by my interpretation.    COORDINATION OF CARE: 8:37 PM- Pt advised of plan for treatment and pt agrees.    Labs Review Labs Reviewed - No data to display  Imaging Review No results found.   EKG Interpretation None      MDM   Final diagnoses:  None    Copd.  Pt improved    The chart was scribed for me under my direct  supervision.  I personally performed the history, physical, and medical decision making and all procedures in the evaluation of this patient.Maudry Diego, MD 04/03/14 [phone]

## 2014-04-03 NOTE — Discharge Instructions (Signed)
Use your inhaler every 4 hours as needed and follow up with your md this week.

## 2014-04-05 ENCOUNTER — Encounter (HOSPITAL_COMMUNITY): Payer: Medicare Other

## 2014-04-12 ENCOUNTER — Encounter (HOSPITAL_COMMUNITY): Payer: Medicare Other

## 2014-04-12 ENCOUNTER — Other Ambulatory Visit (HOSPITAL_COMMUNITY): Payer: Self-pay | Admitting: Oncology

## 2014-04-12 ENCOUNTER — Ambulatory Visit (HOSPITAL_COMMUNITY): Admission: RE | Admit: 2014-04-12 | Payer: Medicare Other | Source: Ambulatory Visit

## 2014-04-12 DIAGNOSIS — R948 Abnormal results of function studies of other organs and systems: Secondary | ICD-10-CM

## 2014-04-26 ENCOUNTER — Other Ambulatory Visit (HOSPITAL_COMMUNITY): Payer: Self-pay | Admitting: Oncology

## 2014-04-26 ENCOUNTER — Ambulatory Visit (HOSPITAL_COMMUNITY)
Admission: RE | Admit: 2014-04-26 | Discharge: 2014-04-26 | Disposition: A | Discharge status: Home / Self Care | Payer: Medicare Other | Source: Ambulatory Visit | Attending: Oncology | Admitting: Oncology

## 2014-04-26 DIAGNOSIS — Z85118 Personal history of other malignant neoplasm of bronchus and lung: Secondary | ICD-10-CM | POA: Insufficient documentation

## 2014-04-26 DIAGNOSIS — R599 Enlarged lymph nodes, unspecified: Secondary | ICD-10-CM | POA: Insufficient documentation

## 2014-04-26 DIAGNOSIS — R948 Abnormal results of function studies of other organs and systems: Secondary | ICD-10-CM

## 2014-06-12 ENCOUNTER — Other Ambulatory Visit (HOSPITAL_COMMUNITY): Payer: Self-pay | Admitting: Oncology

## 2014-06-12 DIAGNOSIS — C3490 Malignant neoplasm of unspecified part of unspecified bronchus or lung: Secondary | ICD-10-CM

## 2014-08-05 ENCOUNTER — Emergency Department (HOSPITAL_COMMUNITY): Payer: Medicare Other

## 2014-08-05 ENCOUNTER — Emergency Department (HOSPITAL_COMMUNITY)
Admission: EM | Admit: 2014-08-05 | Discharge: 2014-08-05 | Disposition: A | Discharge status: Home / Self Care | Payer: Medicare Other | Attending: Emergency Medicine | Admitting: Emergency Medicine

## 2014-08-05 ENCOUNTER — Encounter (HOSPITAL_COMMUNITY): Payer: Self-pay | Admitting: Emergency Medicine

## 2014-08-05 DIAGNOSIS — Z7982 Long term (current) use of aspirin: Secondary | ICD-10-CM | POA: Diagnosis not present

## 2014-08-05 DIAGNOSIS — IMO0002 Reserved for concepts with insufficient information to code with codable children: Secondary | ICD-10-CM | POA: Diagnosis not present

## 2014-08-05 DIAGNOSIS — I10 Essential (primary) hypertension: Secondary | ICD-10-CM | POA: Diagnosis not present

## 2014-08-05 DIAGNOSIS — S8990XA Unspecified injury of unspecified lower leg, initial encounter: Secondary | ICD-10-CM | POA: Diagnosis present

## 2014-08-05 DIAGNOSIS — Y929 Unspecified place or not applicable: Secondary | ICD-10-CM | POA: Insufficient documentation

## 2014-08-05 DIAGNOSIS — E119 Type 2 diabetes mellitus without complications: Secondary | ICD-10-CM | POA: Insufficient documentation

## 2014-08-05 DIAGNOSIS — Z85118 Personal history of other malignant neoplasm of bronchus and lung: Secondary | ICD-10-CM | POA: Insufficient documentation

## 2014-08-05 DIAGNOSIS — M129 Arthropathy, unspecified: Secondary | ICD-10-CM | POA: Diagnosis not present

## 2014-08-05 DIAGNOSIS — S92919A Unspecified fracture of unspecified toe(s), initial encounter for closed fracture: Secondary | ICD-10-CM | POA: Insufficient documentation

## 2014-08-05 DIAGNOSIS — S99919A Unspecified injury of unspecified ankle, initial encounter: Secondary | ICD-10-CM

## 2014-08-05 DIAGNOSIS — J449 Chronic obstructive pulmonary disease, unspecified: Secondary | ICD-10-CM | POA: Insufficient documentation

## 2014-08-05 DIAGNOSIS — Z8719 Personal history of other diseases of the digestive system: Secondary | ICD-10-CM | POA: Insufficient documentation

## 2014-08-05 DIAGNOSIS — Z79899 Other long term (current) drug therapy: Secondary | ICD-10-CM | POA: Insufficient documentation

## 2014-08-05 DIAGNOSIS — Y9389 Activity, other specified: Secondary | ICD-10-CM | POA: Diagnosis not present

## 2014-08-05 DIAGNOSIS — S99929A Unspecified injury of unspecified foot, initial encounter: Secondary | ICD-10-CM | POA: Diagnosis present

## 2014-08-05 DIAGNOSIS — W2203XA Walked into furniture, initial encounter: Secondary | ICD-10-CM | POA: Diagnosis not present

## 2014-08-05 DIAGNOSIS — M810 Age-related osteoporosis without current pathological fracture: Secondary | ICD-10-CM | POA: Insufficient documentation

## 2014-08-05 DIAGNOSIS — Z87891 Personal history of nicotine dependence: Secondary | ICD-10-CM | POA: Diagnosis not present

## 2014-08-05 DIAGNOSIS — S92911A Unspecified fracture of right toe(s), initial encounter for closed fracture: Secondary | ICD-10-CM

## 2014-08-05 DIAGNOSIS — J4489 Other specified chronic obstructive pulmonary disease: Secondary | ICD-10-CM | POA: Insufficient documentation

## 2014-08-05 DIAGNOSIS — Z792 Long term (current) use of antibiotics: Secondary | ICD-10-CM | POA: Insufficient documentation

## 2014-08-05 MED ORDER — HYDROCODONE-ACETAMINOPHEN 5-325 MG PO TABS: 1.0000 | ORAL_TABLET | ORAL | Status: DC | PRN

## 2014-08-05 NOTE — ED Notes (Signed)
Patient with no complaints at this time. Respirations even and unlabored. Skin warm/dry. Discharge instructions reviewed with patient at this time. Patient given opportunity to voice concerns/ask questions. Patient discharged at this time and left Emergency Department with steady gait.   

## 2014-08-05 NOTE — ED Notes (Signed)
Pt states she accidentally kicked an end table with her right foot on Thursday. Pt has bruising and swelling to 2nd and 3rd toes.

## 2014-08-05 NOTE — Discharge Instructions (Signed)
Continue to take the Aleve. Do not take the narcotic if you are driving or doing any activity that may cause injury as it will make you sleepy. Follow up with Dr. Aline Brochure. Return here as needed.   Buddy Taping of Toes We have taped your toes together to keep them from moving. This is called "buddy taping" since we used a part of your own body to keep the injured part still. We placed soft padding between your toes to keep them from rubbing against each other. Buddy taping will help with healing and to reduce pain. Keep your toes buddy taped together for as long as directed by your caregiver. HOME CARE INSTRUCTIONS   Raise your injured area above the level of your heart while sitting or lying down. Prop it up with pillows.  An ice pack used every twenty minutes, while awake, for the first one to two days may be helpful. Put ice in a plastic bag and put a towel between the bag and your skin.  Watch for signs that the taping is too tight. These signs may be:  Numbness of your taped toes.  Coolness of your taped toes.  Color change in the area beyond the tape.  Increased pain.  If you have any of these signs, loosen or rewrap the tape. If you need to loosen or rewrap the buddy tape, make sure you use the padding again. SEEK IMMEDIATE MEDICAL CARE IF:   You have worse pain, swelling, inflammation (soreness), drainage or bleeding after you rewrap the tape.  Any new problems occur. MAKE SURE YOU:   Understand these instructions.  Will watch your condition.  Will get help right away if you are not doing well or get worse. Document Released: 08/28/2004 Document Revised: 02/16/2012 Document Reviewed: 11/21/2008 [facility] Patient Information 2015 [place], [place]. This information is not intended to replace advice given to you by your health care provider. Make sure you discuss any questions you have with your health care provider.

## 2014-08-05 NOTE — ED Provider Notes (Signed)
CSN: [id number]     Arrival date & time 08/05/14  2035 History   First MD Initiated Contact with Patient 08/05/14 2109     CC: right foot injury   (Consider location/radiation/quality/duration/timing/severity/associated sxs/prior Treatment) Patient is a 80 y.o. female presenting with foot injury. The history is provided by the patient.  Foot Injury Location:  Foot Time since incident:  3 days Injury: yes   Mechanism of injury comment:  Hit toes on furniture Foot location:  R foot Pain details:    Quality:  Shooting and sharp   Radiates to:  Does not radiate   Severity:  Moderate   Onset quality:  Sudden   Timing:  Constant   Progression:  Unchanged Chronicity:  New Dislocation: no   Foreign body present:  No foreign bodies Prior injury to area:  No Relieved by:  Nothing Worsened by:  Activity and bearing weight Ineffective treatments:  NSAIDs and elevation Associated symptoms: swelling    Angela Lawson is a 80 y.o. female who presents to the ED with pain, swelling and bruising to the toes of the right foot. She states that 3 nights ago she hit her foot on a table leg. She has been taking aleve and elevating her foot but it continues to hurt. The pain is in the toes and  Dorsum of the the foot.  Past Medical History  Diagnosis Date  . Cancer     left lung/ 2009/ surg only  . Hypertension   . Arthritis   . Diverticulitis   . Diabetes mellitus   . H/O ventral hernia   . Lung cancer   . COPD (chronic obstructive pulmonary disease)   . Osteoporosis   . Adenocarcinoma of lung 08/20/2012   Past Surgical History  Procedure Laterality Date  . Cholecystectomy    . Ectopic pregnancy surgery    . Abdominal hysterectomy    . Lung cancer surgery    . Incisional hernia repair N/A 08/26/2013    Procedure: HERNIA REPAIR INCISIONAL WITH MESH;  Surgeon: Jamesetta So, MD;  Location: [facility];  Service: General;  Laterality: N/A;   Family History  Problem Relation Age of Onset  .  Diabetes Mother   . Hypertension Mother   . Diabetes Father   . Asthma    . Cancer     History  Substance Use Topics  . Smoking status: Former Research scientist (life sciences)  . Smokeless tobacco: Never Used  . Alcohol Use: No   OB History   Grav Para Term Preterm Abortions TAB SAB Ect Mult Living                 Review of Systems Negative except as stated in HPI   Allergies  Review of patient's allergies indicates no known allergies.  Home Medications   Prior to Admission medications   Medication Sig Start Date End Date Taking? Authorizing Provider  albuterol (PROAIR HFA) 108 (90 BASE) MCG/ACT inhaler Inhale 2 puffs into the lungs every 4 (four) hours as needed for wheezing or shortness of breath.    Historical Provider, MD  aspirin EC 81 MG tablet Take 81 mg by mouth daily.    Historical Provider, MD  benazepril-hydrochlorthiazide (LOTENSIN HCT) 20-12.5 MG per tablet Take 0.5 tablets by mouth daily.     Historical Provider, MD  buPROPion (WELLBUTRIN SR) 150 MG 12 hr tablet Take 150 mg by mouth 2 (two) times daily.    Historical Provider, MD  Calcium Carbonate-Vitamin D (CALCIUM 600 +  D PO) Take 1 tablet by mouth daily.    Historical Provider, MD  denosumab (PROLIA) 60 MG/ML SOLN injection Inject 60 mg into the skin every 6 (six) months. Administer in upper arm, thigh, or abdomen    Historical Provider, MD  fish oil-omega-3 fatty acids 1000 MG capsule Take 1 g by mouth daily.    Historical Provider, MD  gabapentin (NEURONTIN) 300 MG capsule Take 300 mg by mouth daily.     Historical Provider, MD  Garlic TABS Take 1 tablet by mouth daily.    Historical Provider, MD  levofloxacin (LEVAQUIN) 500 MG tablet Take 1 tablet (500 mg total) by mouth daily. 04/03/14   Maudry Diego, MD  meclizine (ANTIVERT) 25 MG tablet Take 25 mg by mouth 4 (four) times daily as needed. Dizziness    Historical Provider, MD  omeprazole (PRILOSEC) 20 MG capsule Take 20 mg by mouth daily.    Historical Provider, MD  predniSONE  (DELTASONE) 20 MG tablet 2 tabs po daily x 3 days 04/03/14   Maudry Diego, MD  TRADJENTA 5 MG TABS tablet Take 5 mg by mouth daily.  08/10/12   Historical Provider, MD   BP 158/64  Pulse 92  Temp(Src) 98.8 F (37.1 C) (Oral)  Resp 20  Ht 5\' 4"  (1.626 m)  Wt 168 lb (76.204 kg)  BMI 28.82 kg/m2  SpO2 96% Physical Exam  Nursing note and vitals reviewed. Constitutional: She is oriented to person, place, and time. She appears well-developed and well-nourished.  HENT:  Head: Normocephalic.  Eyes: EOM are normal.  Neck: Normal range of motion. Neck supple.  Cardiovascular: Normal rate.   Pulmonary/Chest: Effort normal.  Musculoskeletal:       Right foot: She exhibits tenderness, bony tenderness and swelling. She exhibits normal range of motion, normal capillary refill, no deformity and no laceration.       Feet:  Tenderness, ecchymosis and swelling to the right toes and extending to the dorsum of the foot. Pedal pulses strong, adequate circulation, good touch sensation.   Neurological: She is alert and oriented to person, place, and time. She has normal strength. No cranial nerve deficit.  Skin: Skin is warm and dry.  Psychiatric: She has a normal mood and affect. Her behavior is normal.    ED Course  Procedures (including critical care time) Labs Review Dg Foot Complete Right  08/05/2014   CLINICAL DATA:  Blunt trauma to the right foot. Swelling and redness to the second and third digits. Pain.  EXAM: RIGHT FOOT COMPLETE - 3+ VIEW  COMPARISON:  None.  FINDINGS: Impaction fracture of the distal aspect proximal phalanx of the right second toe. No displacement. Soft tissue swelling is present. Mild hallux valgus deformity. No additional fractures identified. Degenerative changes in the intertarsal joints.  IMPRESSION: Impacted nondisplaced fracture of the proximal phalanx right second toe.   Electronically Signed   By: Lucienne Capers M.D.   On: 08/05/2014 21:30    MDM  80 y.o. female  with pain to the right foot s/p injury 3 days ago. Treated for fractured toe with buddy tape, post op shoe, pain management and follow up with ortho. Stable for discharge without neurovascular deficits. Skin intact. I have reviewed this patient's vital signs, nurses notes, appropriate labs and imaging.  I have discussed findings with the patient and plan of care and she voices understanding.    Medication List    TAKE these medications       HYDROcodone-acetaminophen 5-325  MG per tablet  Commonly known as:  NORCO/VICODIN  Take 1 tablet by mouth every 4 (four) hours as needed.      ASK your doctor about these medications       aspirin EC 81 MG tablet  Take 81 mg by mouth daily.     benazepril-hydrochlorthiazide 20-12.5 MG per tablet  Commonly known as:  LOTENSIN HCT  Take 0.5 tablets by mouth daily.     buPROPion 150 MG 12 hr tablet  Commonly known as:  WELLBUTRIN SR  Take 150 mg by mouth 2 (two) times daily.     CALCIUM 600 + D PO  Take 1 tablet by mouth daily.     denosumab 60 MG/ML Soln injection  Commonly known as:  PROLIA  Inject 60 mg into the skin every 6 (six) months. Administer in upper arm, thigh, or abdomen     fish oil-omega-3 fatty acids 1000 MG capsule  Take 1 g by mouth daily.     gabapentin 300 MG capsule  Commonly known as:  NEURONTIN  Take 300 mg by mouth daily.     Garlic Tabs  Take 1 tablet by mouth daily.     levofloxacin 500 MG tablet  Commonly known as:  LEVAQUIN  Take 1 tablet (500 mg total) by mouth daily.     meclizine 25 MG tablet  Commonly known as:  ANTIVERT  Take 25 mg by mouth 4 (four) times daily as needed. Dizziness     omeprazole 20 MG capsule  Commonly known as:  PRILOSEC  Take 20 mg by mouth daily.     predniSONE 20 MG tablet  Commonly known as:  DELTASONE  2 tabs po daily x 3 days     PROAIR HFA 108 (90 BASE) MCG/ACT inhaler  Generic drug:  albuterol  Inhale 2 puffs into the lungs every 4 (four) hours as needed for  wheezing or shortness of breath.     TRADJENTA 5 MG Tabs tablet  Generic drug:  linagliptin  Take 5 mg by mouth daily.            [place], [place] 08/05/14 [phone]

## 2014-08-06 NOTE — ED Provider Notes (Signed)
Medical screening examination/treatment/procedure(s) were performed by non-physician practitioner and as supervising physician I was immediately available for consultation/collaboration.   EKG Interpretation None        Maudry Diego, MD 08/06/14 [phone]

## 2014-08-16 ENCOUNTER — Telehealth: Payer: Self-pay | Admitting: Gastroenterology

## 2014-08-16 NOTE — Telephone Encounter (Signed)
PATIENT PCP DR. [name] WANTS HER TO HAVE A COLONOSCOPY. PLEASE CALL PATIENT OR HER DAUGHTER [name] REGARDING THIS  [phone]   PATIENT CALLED TODAY INQUIRING ABOUT IT

## 2014-08-17 ENCOUNTER — Ambulatory Visit (INDEPENDENT_AMBULATORY_CARE_PROVIDER_SITE_OTHER): Payer: Medicare Other | Admitting: Orthopedic Surgery

## 2014-08-17 VITALS — BP 114/64 | Ht 64.0 in | Wt 168.0 lb

## 2014-08-17 DIAGNOSIS — S92911A Unspecified fracture of right toe(s), initial encounter for closed fracture: Secondary | ICD-10-CM

## 2014-08-17 DIAGNOSIS — S92919A Unspecified fracture of unspecified toe(s), initial encounter for closed fracture: Secondary | ICD-10-CM

## 2014-08-17 MED ORDER — HYDROCODONE-ACETAMINOPHEN 5-325 MG PO TABS: 1.0000 | ORAL_TABLET | ORAL | Status: DC | PRN

## 2014-08-17 NOTE — Progress Notes (Signed)
Established patient new problem Dr. Legrand Rams his referring physician [facility] as the pharmacy  Complaint pain right foot secondary to second digit fracture  Date of injury August 28  Complains of pain swelling numbness, sharp throbbing description of pain, constant, 10 out of 10. Relieved by hydrocodone. Worse with walking. Review of systems hearing loss ringing of the ears shortness of breath cough wheezing breathing issues seasonal allergies stiff joints joint pain all other systems were negative  Past medical history Past Medical History  Diagnosis Date  . Cancer     left lung/ 2009/ surg only  . Hypertension   . Arthritis   . Diverticulitis   . Diabetes mellitus   . H/O ventral hernia   . Lung cancer   . COPD (chronic obstructive pulmonary disease)   . Osteoporosis   . Adenocarcinoma of lung 08/20/2012   Past Surgical History  Procedure Laterality Date  . Cholecystectomy    . Ectopic pregnancy surgery    . Abdominal hysterectomy    . Lung cancer surgery    . Incisional hernia repair N/A 08/26/2013    Procedure: HERNIA REPAIR INCISIONAL WITH MESH;  Surgeon: Jamesetta So, MD;  Location: [facility];  Service: General;  Laterality: N/A;   Denies any allergies to medications  BP 114/64  Ht 5\' 4"  (1.626 m)  Wt 168 lb (76.204 kg)  BMI 28.82 kg/m2 General appearance is normal she is oriented to person place and time her mood is excellent her affect is pleasant her ambulatory status reveals a mild limp with a shuffling gait. She has an intact skin envelope over the right second digit with swelling painful range of motion tenderness at the fracture site no instability. The foot has normal muscle tone. Has good dorsalis pedis pulse no lymph nodes are swollen she has normal sensation in the foot  X-rays show a split condylar fracture of the second digit at the proximal phalanx  Recommend postop shoe for 4 weeks total come back here for x-ray in 2 weeks  Refill hydrocodone.  Encounter  Diagnosis  Name Primary?  . Fractured toe, right, closed, initial encounter Yes   Meds ordered this encounter  Medications  . HYDROcodone-acetaminophen (NORCO/VICODIN) 5-325 MG per tablet    Sig: Take 1 tablet by mouth every 4 (four) hours as needed.    Dispense:  84 tablet    Refill:  0

## 2014-08-17 NOTE — Patient Instructions (Signed)
Post op shoe x 2 weeks

## 2014-08-21 ENCOUNTER — Telehealth: Payer: Self-pay

## 2014-08-21 NOTE — Telephone Encounter (Signed)
Pt received letter to be triaged and called to let us know that she spoke with her insurance and they said that they would cover the procedure. Please call patient back at [phone]

## 2014-08-22 ENCOUNTER — Other Ambulatory Visit: Payer: Self-pay

## 2014-08-22 DIAGNOSIS — Z1211 Encounter for screening for malignant neoplasm of colon: Secondary | ICD-10-CM

## 2014-08-22 NOTE — Telephone Encounter (Signed)
Gastroenterology Pre-Procedure Review  Request Date: 08/21/2014 Requesting Physician: Dr. Legrand Rams  PATIENT REVIEW QUESTIONS: The patient responded to the following health history questions as indicated:    PT SAID SHE HAD A COLONOSCOPY IN [place] MANY YEARS AGO AND IT WAS NORMAL  1. Diabetes Melitis: YES 2. Joint replacements in the past 12 months: no 3. Major health problems in the past 3 months: no 4. Has an artificial valve or MVP: no 5. Has a defibrillator: no 6. Has been advised in past to take antibiotics in advance of a procedure like teeth cleaning: no    MEDICATIONS & ALLERGIES:    Patient reports the following regarding taking any blood thinners:   Plavix? no Aspirin? YES Coumadin? no  Patient confirms/reports the following medications:  Current Outpatient Prescriptions  Medication Sig Dispense Refill  . albuterol (PROAIR HFA) 108 (90 BASE) MCG/ACT inhaler Inhale 2 puffs into the lungs every 4 (four) hours as needed for wheezing or shortness of breath.      Marland Kitchen aspirin EC 81 MG tablet Take 81 mg by mouth daily.      . benazepril-hydrochlorthiazide (LOTENSIN HCT) 20-12.5 MG per tablet Take 0.5 tablets by mouth daily.       Marland Kitchen buPROPion (WELLBUTRIN SR) 150 MG 12 hr tablet Take 150 mg by mouth 2 (two) times daily.      . Calcium Carbonate-Vitamin D (CALCIUM 600 + D PO) Take 1 tablet by mouth daily.      Marland Kitchen denosumab (PROLIA) 60 MG/ML SOLN injection Inject 60 mg into the skin every 6 (six) months. Administer in upper arm, thigh, or abdomen      . fish oil-omega-3 fatty acids 1000 MG capsule Take 1 g by mouth daily.      Marland Kitchen gabapentin (NEURONTIN) 300 MG capsule Take 300 mg by mouth daily.       . Garlic TABS Take 1 tablet by mouth daily.      Marland Kitchen HYDROcodone-acetaminophen (NORCO/VICODIN) 5-325 MG per tablet Take 1 tablet by mouth every 4 (four) hours as needed.  84 tablet  0  . Nebulizer MISC by Does not apply route. Has nebulizer and uses as needed      . NON FORMULARY Uses oxygen as  needed      . omeprazole (PRILOSEC) 20 MG capsule Take 20 mg by mouth daily.      . TRADJENTA 5 MG TABS tablet Take 5 mg by mouth daily.       Marland Kitchen levofloxacin (LEVAQUIN) 500 MG tablet Take 1 tablet (500 mg total) by mouth daily.  7 tablet  0  . meclizine (ANTIVERT) 25 MG tablet Take 25 mg by mouth 4 (four) times daily as needed. Dizziness      . predniSONE (DELTASONE) 20 MG tablet 2 tabs po daily x 3 days  6 tablet  0   No current facility-administered medications for this visit.    Patient confirms/reports the following allergies:  No Known Allergies  No orders of the defined types were placed in this encounter.    AUTHORIZATION INFORMATION Primary Insurance:  ID #:   Group #:  Pre-Cert / Auth required: Pre-Cert / Auth #:   Secondary Insurance:   ID #:   Group #:  Pre-Cert / Auth required: Pre-Cert / Auth #:   SCHEDULE INFORMATION: Procedure has been scheduled as follows:  Date:  09/18/2014                 Time: 8:30 AM Location: [facility]  Short Stay  This Gastroenterology Pre-Precedure Review Form is being routed to the following provider(s): Barney Drain, MD

## 2014-08-22 NOTE — Telephone Encounter (Signed)
MOVI PREP SPLIT DOSING, CLEAR LIQUIDS AFTER 9 AM. PT MAY HAVE A REGULAR BREAKFAST.   HOLD TRADJENTA ON AM OF TCS. PHENERGAN 12.5 MG IV IN PRE-OP.

## 2014-08-22 NOTE — Telephone Encounter (Signed)
See separate triage.  

## 2014-08-24 ENCOUNTER — Other Ambulatory Visit: Payer: Self-pay

## 2014-08-24 DIAGNOSIS — Z1211 Encounter for screening for malignant neoplasm of colon: Secondary | ICD-10-CM

## 2014-08-24 MED ORDER — PEG-KCL-NACL-NASULF-NA ASC-C 100 G PO SOLR
1.0000 | ORAL | Status: DC
Start: 2014-08-24 — End: 2014-09-05

## 2014-08-24 NOTE — Telephone Encounter (Signed)
Rx sent to the pharmacy and instructions mailed to pt.  

## 2014-08-24 NOTE — Telephone Encounter (Signed)
Phenergan order added.

## 2014-08-24 NOTE — Addendum Note (Signed)
Addended by: Everardo All on: 08/24/2014 08:16 AM   Modules accepted: Orders

## 2014-08-30 NOTE — Telephone Encounter (Signed)
Dr. Oneida Alar will have a late start and the patient will need to be at [facility] at 9:30 am. I called the patient, no answer.lmom

## 2014-08-31 ENCOUNTER — Encounter: Payer: Self-pay | Admitting: Orthopedic Surgery

## 2014-08-31 ENCOUNTER — Ambulatory Visit: Payer: Medicare Other | Admitting: Orthopedic Surgery

## 2014-08-31 NOTE — Telephone Encounter (Signed)
Pt is aware of appt change

## 2014-09-05 ENCOUNTER — Encounter (HOSPITAL_COMMUNITY): Payer: Self-pay | Admitting: Pharmacy Technician

## 2014-09-10 ENCOUNTER — Emergency Department (HOSPITAL_COMMUNITY)
Admission: EM | Admit: 2014-09-10 | Discharge: 2014-09-10 | Disposition: A | Discharge status: Home / Self Care | Payer: Medicare Other | Attending: Emergency Medicine | Admitting: Emergency Medicine

## 2014-09-10 ENCOUNTER — Encounter (HOSPITAL_COMMUNITY): Payer: Self-pay | Admitting: Emergency Medicine

## 2014-09-10 DIAGNOSIS — R062 Wheezing: Secondary | ICD-10-CM

## 2014-09-10 DIAGNOSIS — Z7982 Long term (current) use of aspirin: Secondary | ICD-10-CM | POA: Insufficient documentation

## 2014-09-10 DIAGNOSIS — B372 Candidiasis of skin and nail: Secondary | ICD-10-CM | POA: Insufficient documentation

## 2014-09-10 DIAGNOSIS — M810 Age-related osteoporosis without current pathological fracture: Secondary | ICD-10-CM | POA: Diagnosis not present

## 2014-09-10 DIAGNOSIS — R21 Rash and other nonspecific skin eruption: Secondary | ICD-10-CM | POA: Diagnosis present

## 2014-09-10 DIAGNOSIS — Z87891 Personal history of nicotine dependence: Secondary | ICD-10-CM | POA: Diagnosis not present

## 2014-09-10 DIAGNOSIS — Z79899 Other long term (current) drug therapy: Secondary | ICD-10-CM | POA: Diagnosis not present

## 2014-09-10 DIAGNOSIS — I10 Essential (primary) hypertension: Secondary | ICD-10-CM | POA: Insufficient documentation

## 2014-09-10 DIAGNOSIS — J441 Chronic obstructive pulmonary disease with (acute) exacerbation: Secondary | ICD-10-CM | POA: Insufficient documentation

## 2014-09-10 DIAGNOSIS — E119 Type 2 diabetes mellitus without complications: Secondary | ICD-10-CM | POA: Diagnosis not present

## 2014-09-10 DIAGNOSIS — Z85118 Personal history of other malignant neoplasm of bronchus and lung: Secondary | ICD-10-CM | POA: Diagnosis not present

## 2014-09-10 DIAGNOSIS — Z9981 Dependence on supplemental oxygen: Secondary | ICD-10-CM | POA: Insufficient documentation

## 2014-09-10 DIAGNOSIS — Z8719 Personal history of other diseases of the digestive system: Secondary | ICD-10-CM | POA: Insufficient documentation

## 2014-09-10 DIAGNOSIS — M1990 Unspecified osteoarthritis, unspecified site: Secondary | ICD-10-CM | POA: Insufficient documentation

## 2014-09-10 MED ORDER — IPRATROPIUM-ALBUTEROL 0.5-2.5 (3) MG/3ML IN SOLN
3.0000 mL | Freq: Once | RESPIRATORY_TRACT | Status: AC
  Administered 2014-09-10: 3 mL via RESPIRATORY_TRACT
  Filled 2014-09-10: qty 3

## 2014-09-10 MED ORDER — NYSTATIN 100000 UNIT/GM EX POWD: CUTANEOUS | Status: DC

## 2014-09-10 MED ORDER — ALBUTEROL SULFATE HFA 108 (90 BASE) MCG/ACT IN AERS: 2.0000 | INHALATION_SPRAY | RESPIRATORY_TRACT | Status: DC | PRN

## 2014-09-10 NOTE — Discharge Instructions (Signed)
If you were given medicines take as directed.  If you are on coumadin or contraceptives realize their levels and effectiveness is altered by many different medicines.  If you have any reaction (rash, tongues swelling, other) to the medicines stop taking and see a physician.   Please follow up as directed and return to the ER or see a physician for new or worsening symptoms.  Thank you. Filed Vitals:   09/10/14 0909  BP: 169/72  Pulse: 104  Temp: 98 F (36.7 C)  TempSrc: Oral  Height: 5\' 4"  (1.626 m)  Weight: 165 lb (74.844 kg)  SpO2: 92%    Cutaneous Candidiasis Cutaneous candidiasis is a condition in which there is an overgrowth of yeast (candida) on the skin. Yeast normally live on the skin, but in small enough numbers not to cause any symptoms. In certain cases, increased growth of the yeast may cause an actual yeast infection. This kind of infection usually occurs in areas of the skin that are constantly warm and moist, such as the armpits or the groin. Yeast is the most common cause of diaper rash in babies and in people who cannot control their bowel movements (incontinence). CAUSES  The fungus that most often causes cutaneous candidiasis is Candida albicans. Conditions that can increase the risk of getting a yeast infection of the skin include:  Obesity.  Pregnancy.  Diabetes.  Taking antibiotic medicine.  Taking birth control pills.  Taking steroid medicines.  Thyroid disease.  An iron or zinc deficiency.  Problems with the immune system. SYMPTOMS   Red, swollen area of the skin.  Bumps on the skin.  Itchiness. DIAGNOSIS  The diagnosis of cutaneous candidiasis is usually based on its appearance. Light scrapings of the skin may also be taken and viewed under a microscope to identify the presence of yeast. TREATMENT  Antifungal creams may be applied to the infected skin. In severe cases, oral medicines may be needed.  HOME CARE INSTRUCTIONS   Keep your skin  clean and dry.  Maintain a healthy weight.  If you have diabetes, keep your blood sugar under control. SEEK IMMEDIATE MEDICAL CARE IF:  Your rash continues to spread despite treatment.  You have a fever, chills, or abdominal pain. Document Released: 08/12/2011 Document Revised: 02/16/2012 Document Reviewed: 08/12/2011 [facility] Patient Information 2015 [place], [place]. This information is not intended to replace advice given to you by your health care provider. Make sure you discuss any questions you have with your health care provider.

## 2014-09-10 NOTE — ED Provider Notes (Signed)
CSN: [id number]     Arrival date & time 09/10/14  0902 History   This chart was scribed for Angela Clonts, MD, by Neta Ehlers, ED Scribe. This patient was seen in room APA03/[facility]03 and the patient's care was started at 9:25 AM.  First MD Initiated Contact with Patient 09/10/14 [phone]     Chief Complaint  Patient presents with  . Rash    Patient is a 80 y.o. female presenting with rash. The history is provided by the patient. No language interpreter was used.  Rash Location:  Ano-genital and torso Torso rash location:  Lower back, abd RLQ and abd LLQ Ano-genital rash location:  Groin Quality: dryness and redness   Quality: not painful   Severity:  Moderate Onset quality:  Gradual Duration:  2 weeks Timing:  Constant Progression:  Unchanged Chronicity:  New Context: not animal contact and not new detergent/soap   Relieved by:  Nothing Ineffective treatments:  Anti-fungal cream  HPI Comments: Angela Lawson is a 80 y.o. female, with a h/o adenocarcinoma of the lung, who presents to the Emergency Department complaining of a pervasive rash which has persisted for approximately two weeks. The rash is around her umbilicus, in the skin folds of her lower abdomen, under her breasts, to the groin, and to her middle back. She denies chest pain,  fever, nausea, or emesis. The pt denies new laundry detergents, new toiletries, new jewelry, new foods, or animal exposure. She has been treated by her PCP, Dr. Legrand Rams, who prescribed her a topical cream which she has used without resolution. The pt also endorses a mild cough. She denies increased SOB; she uses oxygen at home PRN. She is a former smoker.   Past Medical History  Diagnosis Date  . Cancer     left lung/ 2009/ surg only  . Hypertension   . Arthritis   . Diverticulitis   . Diabetes mellitus   . H/O ventral hernia   . Lung cancer   . COPD (chronic obstructive pulmonary disease)   . Osteoporosis   . Adenocarcinoma of lung 08/20/2012    Past Surgical History  Procedure Laterality Date  . Cholecystectomy    . Ectopic pregnancy surgery    . Abdominal hysterectomy    . Lung cancer surgery    . Incisional hernia repair N/A 08/26/2013    Procedure: HERNIA REPAIR INCISIONAL WITH MESH;  Surgeon: Jamesetta So, MD;  Location: [facility];  Service: General;  Laterality: N/A;   Family History  Problem Relation Age of Onset  . Diabetes Mother   . Hypertension Mother   . Diabetes Father   . Asthma    . Cancer     History  Substance Use Topics  . Smoking status: Former Research scientist (life sciences)  . Smokeless tobacco: Never Used  . Alcohol Use: No   No OB history provided.  Review of Systems  Skin: Positive for rash.    A complete 10 system review of systems was obtained, and all systems were negative except where indicated in the HPI and PE.    Allergies  Review of patient's allergies indicates no known allergies.  Home Medications   Prior to Admission medications   Medication Sig Start Date End Date Taking? Authorizing Provider  albuterol (PROAIR HFA) 108 (90 BASE) MCG/ACT inhaler Inhale 2 puffs into the lungs every 4 (four) hours as needed for wheezing or shortness of breath.    Historical Provider, MD  aspirin EC 81 MG tablet Take 81 mg  by mouth daily.    Historical Provider, MD  benazepril-hydrochlorthiazide (LOTENSIN HCT) 20-12.5 MG per tablet Take 0.5 tablets by mouth daily.     Historical Provider, MD  buPROPion (WELLBUTRIN SR) 150 MG 12 hr tablet Take 150 mg by mouth 2 (two) times daily.    Historical Provider, MD  Calcium Carbonate-Vitamin D (CALCIUM 600 + D PO) Take 1 tablet by mouth daily.    Historical Provider, MD  denosumab (PROLIA) 60 MG/ML SOLN injection Inject 60 mg into the skin every 6 (six) months. Administer in upper arm, thigh, or abdomen    Historical Provider, MD  fish oil-omega-3 fatty acids 1000 MG capsule Take 1 g by mouth daily.    Historical Provider, MD  gabapentin (NEURONTIN) 300 MG capsule Take 300 mg by  mouth daily.     Historical Provider, MD  Garlic TABS Take 1 tablet by mouth daily.    Historical Provider, MD  HYDROcodone-acetaminophen (NORCO/VICODIN) 5-325 MG per tablet Take 1 tablet by mouth every 4 (four) hours as needed. 08/17/14   Carole Civil, MD  omeprazole (PRILOSEC) 20 MG capsule Take 20 mg by mouth daily.    Historical Provider, MD  TRADJENTA 5 MG TABS tablet Take 5 mg by mouth daily.  08/10/12   Historical Provider, MD   Triage Vitals:  BP 169/72  Pulse 104  Temp(Src) 98 F (36.7 C) (Oral)  Ht 5\' 4"  (1.626 m)  Wt 165 lb (74.844 kg)  BMI 28.31 kg/m2  SpO2 92%  Physical Exam  Nursing note and vitals reviewed. Constitutional: She is oriented to person, place, and time. She appears well-developed and well-nourished. No distress.  HENT:  Head: Normocephalic and atraumatic.  Eyes: Conjunctivae and EOM are normal.  Neck: Neck supple. No tracheal deviation present.  Cardiovascular: Normal rate and regular rhythm.   Pulmonary/Chest: Effort normal. No respiratory distress. She has wheezes.  Mild expiratory wheezes bilaterally.    Musculoskeletal: Normal range of motion. She exhibits no edema.  No significant swelling to legs.   Neurological: She is alert and oriented to person, place, and time.  Skin: Skin is warm and dry. Rash noted.  Rash to skin folders of breast, lower chest,  andlower abdomen. Mild erythema present. Mild induration. No warmth. Non-tender.  Rash to midline of lower back. Mild excoriation, dry skin associated with it. No induration. No crepitus.   Psychiatric: She has a normal mood and affect. Her behavior is normal.    ED Course  Procedures (including critical care time)  DIAGNOSTIC STUDIES: Oxygen Saturation is 92% on [place], low by my interpretation.    COORDINATION OF CARE:  9:36 AM- Discussed treatment plan with patient, and the patient agreed to the plan. The plan includes a topical cream as well as a nebulizer treatment. Advised pt to follow-up  with Dr. Legrand Rams.   Labs Review Labs Reviewed - No data to display  Imaging Review No results found.   EKG Interpretation None      MDM   Final diagnoses:  Wheezing  Candidal intertrigo   I personally performed the services described in this documentation, which was scribed in my presence. The recorded information has been reviewed and is accurate.  Well-appearing female with mild wheezing and no shortness breath or chest pain, no known lung disease except for lung cancer. Nebulizer in the ER and close followup outpatient with albuterol for home.  Rash clinically most likely intertrigo/Candida infection, nystatin powder ordered an outpatient followup with primary Dr. and if no  improvement recommended seeing dermatology in [place].  Results and differential diagnosis were discussed with the patient/parent/guardian. Close follow up outpatient was discussed, comfortable with the plan.   Medications  ipratropium-albuterol (DUONEB) 0.5-2.5 (3) MG/3ML nebulizer solution 3 mL (not administered)    Filed Vitals:   09/10/14 0909  BP: 169/72  Pulse: 104  Temp: 98 F (36.7 C)  TempSrc: Oral  Height: 5\' 4"  (1.626 m)  Weight: 165 lb (74.844 kg)  SpO2: 92%    Final diagnoses:  Wheezing  Candidal intertrigo      Angela Clonts, MD 09/10/14 1605

## 2014-09-10 NOTE — ED Notes (Signed)
Pt ready for discharge once breathing treatment is finished, per Dr. Reather Converse

## 2014-09-10 NOTE — ED Notes (Signed)
Pt states this rash began about a week ago. Pt denies doing anything out of the ordinary. Pt denies new laundry detergent, shampoo, body wash, etc.

## 2014-09-14 ENCOUNTER — Ambulatory Visit (HOSPITAL_COMMUNITY)
Admission: RE | Admit: 2014-09-14 | Discharge: 2014-09-14 | Disposition: A | Discharge status: Home / Self Care | Payer: Medicare Other | Source: Ambulatory Visit | Attending: Oncology | Admitting: Oncology

## 2014-09-14 ENCOUNTER — Other Ambulatory Visit (HOSPITAL_COMMUNITY): Payer: Self-pay | Admitting: Family Medicine

## 2014-09-14 DIAGNOSIS — I10 Essential (primary) hypertension: Secondary | ICD-10-CM | POA: Insufficient documentation

## 2014-09-14 DIAGNOSIS — E119 Type 2 diabetes mellitus without complications: Secondary | ICD-10-CM | POA: Insufficient documentation

## 2014-09-14 DIAGNOSIS — R0602 Shortness of breath: Secondary | ICD-10-CM | POA: Insufficient documentation

## 2014-09-14 DIAGNOSIS — K228 Other specified diseases of esophagus: Secondary | ICD-10-CM | POA: Diagnosis not present

## 2014-09-14 DIAGNOSIS — R911 Solitary pulmonary nodule: Secondary | ICD-10-CM | POA: Diagnosis not present

## 2014-09-14 DIAGNOSIS — C3490 Malignant neoplasm of unspecified part of unspecified bronchus or lung: Secondary | ICD-10-CM

## 2014-09-14 DIAGNOSIS — J439 Emphysema, unspecified: Secondary | ICD-10-CM | POA: Diagnosis not present

## 2014-09-18 ENCOUNTER — Encounter (HOSPITAL_COMMUNITY): Payer: Self-pay | Admitting: *Deleted

## 2014-09-18 ENCOUNTER — Ambulatory Visit (HOSPITAL_COMMUNITY)
Admission: RE | Admit: 2014-09-18 | Discharge: 2014-09-18 | Disposition: A | Discharge status: Home / Self Care | Payer: Medicare Other | Source: Ambulatory Visit | Attending: Gastroenterology | Admitting: Gastroenterology

## 2014-09-18 ENCOUNTER — Encounter (HOSPITAL_COMMUNITY)
Admission: RE | Disposition: A | Discharge status: Home / Self Care | Payer: Self-pay | Source: Ambulatory Visit | Attending: Gastroenterology

## 2014-09-18 ENCOUNTER — Ambulatory Visit (HOSPITAL_COMMUNITY): Payer: Medicare Other

## 2014-09-18 DIAGNOSIS — Z1211 Encounter for screening for malignant neoplasm of colon: Secondary | ICD-10-CM | POA: Insufficient documentation

## 2014-09-18 DIAGNOSIS — I10 Essential (primary) hypertension: Secondary | ICD-10-CM | POA: Insufficient documentation

## 2014-09-18 DIAGNOSIS — Z85118 Personal history of other malignant neoplasm of bronchus and lung: Secondary | ICD-10-CM | POA: Insufficient documentation

## 2014-09-18 DIAGNOSIS — J449 Chronic obstructive pulmonary disease, unspecified: Secondary | ICD-10-CM | POA: Diagnosis not present

## 2014-09-18 DIAGNOSIS — M1990 Unspecified osteoarthritis, unspecified site: Secondary | ICD-10-CM | POA: Insufficient documentation

## 2014-09-18 DIAGNOSIS — K648 Other hemorrhoids: Secondary | ICD-10-CM | POA: Insufficient documentation

## 2014-09-18 DIAGNOSIS — K5790 Diverticulosis of intestine, part unspecified, without perforation or abscess without bleeding: Secondary | ICD-10-CM | POA: Diagnosis not present

## 2014-09-18 DIAGNOSIS — E119 Type 2 diabetes mellitus without complications: Secondary | ICD-10-CM | POA: Diagnosis not present

## 2014-09-18 DIAGNOSIS — D124 Benign neoplasm of descending colon: Secondary | ICD-10-CM | POA: Diagnosis not present

## 2014-09-18 DIAGNOSIS — M810 Age-related osteoporosis without current pathological fracture: Secondary | ICD-10-CM | POA: Diagnosis not present

## 2014-09-18 DIAGNOSIS — D122 Benign neoplasm of ascending colon: Secondary | ICD-10-CM | POA: Diagnosis not present

## 2014-09-18 HISTORY — PX: COLONOSCOPY: SHX5424

## 2014-09-18 LAB — GLUCOSE, CAPILLARY: Glucose-Capillary: 110 mg/dL — ABNORMAL HIGH (ref 70–99)

## 2014-09-18 SURGERY — COLONOSCOPY
Anesthesia: Moderate Sedation

## 2014-09-18 MED ORDER — MEPERIDINE HCL 100 MG/ML IJ SOLN
INTRAMUSCULAR | Status: DC | PRN
  Administered 2014-09-18 (×2): 25 mg via INTRAVENOUS

## 2014-09-18 MED ORDER — PROMETHAZINE HCL 25 MG/ML IJ SOLN: 12.5000 mg | Freq: Once | INTRAMUSCULAR | Status: DC

## 2014-09-18 MED ORDER — MEPERIDINE HCL 100 MG/ML IJ SOLN
INTRAMUSCULAR | Status: AC
  Filled 2014-09-18: qty 2

## 2014-09-18 MED ORDER — MIDAZOLAM HCL 5 MG/5ML IJ SOLN
INTRAMUSCULAR | Status: DC | PRN
  Administered 2014-09-18: 1 mg via INTRAVENOUS
  Administered 2014-09-18 (×2): 2 mg via INTRAVENOUS

## 2014-09-18 MED ORDER — SODIUM CHLORIDE 0.9 % IV SOLN
INTRAVENOUS | Status: DC
  Administered 2014-09-18: 08:00:00 via INTRAVENOUS

## 2014-09-18 MED ORDER — SODIUM CHLORIDE 0.9 % IJ SOLN
INTRAMUSCULAR | Status: AC
  Filled 2014-09-18: qty 10

## 2014-09-18 MED ORDER — PROMETHAZINE HCL 25 MG/ML IJ SOLN
INTRAMUSCULAR | Status: AC
  Filled 2014-09-18: qty 1

## 2014-09-18 MED ORDER — MIDAZOLAM HCL 5 MG/5ML IJ SOLN
INTRAMUSCULAR | Status: AC
  Filled 2014-09-18: qty 10

## 2014-09-18 MED ORDER — PROMETHAZINE HCL 25 MG/ML IJ SOLN
INTRAMUSCULAR | Status: DC | PRN
  Administered 2014-09-18: 12.5 mg via INTRAVENOUS

## 2014-09-18 MED ORDER — SIMETHICONE 40 MG/0.6ML PO SUSP
ORAL | Status: DC | PRN
Start: 2014-09-18 — End: 2014-09-18
  Administered 2014-09-18: 11:00:00

## 2014-09-18 NOTE — Discharge Instructions (Signed)
You had 2 small polyps removed. You have internal hemorrhoids and diverticulosis IN YOUR RIGHT AND LEFT COLON.   HOLD ASPIRIN. RESTART OCT 18.  FOLLOW A HIGH FIBER DIET. AVOID ITEMS THAT CAUSE BLOATING. SEE INFO BELOW.  YOUR BIOPSY RESULTS SHOULD BE BACK IN 14 DAYS.  Next colonoscopy in 10-15 years if the benefits outweigh the risks..   Colonoscopy Care After Read the instructions outlined below and refer to this sheet in the next week. These discharge instructions provide you with general information on caring for yourself after you leave the hospital. While your treatment has been planned according to the most current medical practices available, unavoidable complications occasionally occur. If you have any problems or questions after discharge, call DR. Devontre Siedschlag, [phone].  ACTIVITY  You may resume your regular activity, but move at a slower pace for the next 24 hours.   Take frequent rest periods for the next 24 hours.   Walking will help get rid of the air and reduce the bloated feeling in your belly (abdomen).   No driving for 24 hours (because of the medicine (anesthesia) used during the test).   You may shower.   Do not sign any important legal documents or operate any machinery for 24 hours (because of the anesthesia used during the test).    NUTRITION  Drink plenty of fluids.   You may resume your normal diet as instructed by your doctor.   Begin with a light meal and progress to your normal diet. Heavy or fried foods are harder to digest and may make you feel sick to your stomach (nauseated).   Avoid alcoholic beverages for 24 hours or as instructed.    MEDICATIONS  You may resume your normal medications.   WHAT YOU CAN EXPECT TODAY  Some feelings of bloating in the abdomen.   Passage of more gas than usual.   Spotting of blood in your stool or on the toilet paper  .  IF YOU HAD POLYPS REMOVED DURING THE COLONOSCOPY:  Eat a soft diet IF YOU HAVE  NAUSEA, BLOATING, ABDOMINAL PAIN, OR VOMITING.    FINDING OUT THE RESULTS OF YOUR TEST Not all test results are available during your visit. DR. Oneida Alar WILL CALL YOU WITHIN 14 DAYS OF YOUR PROCEDUE WITH YOUR RESULTS. Do not assume everything is normal if you have not heard from DR. Natascha Edmonds IN two WEEKs, CALL HER OFFICE AT [phone].  SEEK IMMEDIATE MEDICAL ATTENTION AND CALL THE OFFICE: [phone] IF:  You have more than a spotting of blood in your stool.   Your belly is swollen (abdominal distention).   You are nauseated or vomiting.   You have a temperature over 101F.   You have abdominal pain or discomfort that is severe or gets worse throughout the day.  Polyps, Colon  A polyp is extra tissue that grows inside your body. Colon polyps grow in the large intestine. The large intestine, also called the colon, is part of your digestive system. It is a long, hollow tube at the end of your digestive tract where your body makes and stores stool. Most polyps are not dangerous. They are benign. This means they are not cancerous. But over time, some types of polyps can turn into cancer. Polyps that are smaller than a pea are usually not harmful. But larger polyps could someday become or may already be cancerous. To be safe, doctors remove all polyps and test them.   WHO GETS POLYPS? Anyone can get polyps, but certain  people are more likely than others. You may have a greater chance of getting polyps if:  You are over 50.   You have had polyps before.   Someone in your family has had polyps.   Someone in your family has had cancer of the large intestine.   Find out if someone in your family has had polyps. You may also be more likely to get polyps if you:   Eat a lot of fatty foods   Smoke   Drink alcohol   Do not exercise  Eat too much   TREATMENT  The caregiver will remove the polyp during sigmoidoscopy or colonoscopy.  PREVENTION There is not one sure way to prevent  polyps. You might be able to lower your risk of getting them if you:  Eat more fruits and vegetables and less fatty food.   Do not smoke.   Avoid alcohol.   Exercise every day.   Lose weight if you are overweight.   Eating more calcium and folate can also lower your risk of getting polyps. Some foods that are rich in calcium are milk, cheese, and broccoli. Some foods that are rich in folate are chickpeas, kidney beans, and spinach.   High-Fiber Diet A high-fiber diet changes your normal diet to include more whole grains, legumes, fruits, and vegetables. Changes in the diet involve replacing refined carbohydrates with unrefined foods. The calorie level of the diet is essentially unchanged. The Dietary Reference Intake (recommended amount) for adult males is 38 grams per day. For adult females, it is 25 grams per day. Pregnant and lactating women should consume 28 grams of fiber per day. Fiber is the intact part of a plant that is not broken down during digestion. Functional fiber is fiber that has been isolated from the plant to provide a beneficial effect in the body. PURPOSE  Increase stool bulk.   Ease and regulate bowel movements.   Lower cholesterol.  INDICATIONS THAT YOU NEED MORE FIBER  Constipation and hemorrhoids.   Uncomplicated diverticulosis (intestine condition) and irritable bowel syndrome.   Weight management.   As a protective measure against hardening of the arteries (atherosclerosis), diabetes, and cancer.   GUIDELINES FOR INCREASING FIBER IN THE DIET  Start adding fiber to the diet slowly. A gradual increase of about 5 more grams (2 slices of whole-wheat bread, 2 servings of most fruits or vegetables, or 1 bowl of high-fiber cereal) per day is best. Too rapid an increase in fiber may result in constipation, flatulence, and bloating.   Drink enough water and fluids to keep your urine clear or pale yellow. Water, juice, or caffeine-free drinks are recommended.  Not drinking enough fluid may cause constipation.   Eat a variety of high-fiber foods rather than one type of fiber.   Try to increase your intake of fiber through using high-fiber foods rather than fiber pills or supplements that contain small amounts of fiber.   The goal is to change the types of food eaten. Do not supplement your present diet with high-fiber foods, but replace foods in your present diet.  INCLUDE A VARIETY OF FIBER SOURCES  Replace refined and processed grains with whole grains, canned fruits with fresh fruits, and incorporate other fiber sources. White rice, white breads, and most bakery goods contain little or no fiber.   Brown whole-grain rice, buckwheat oats, and many fruits and vegetables are all good sources of fiber. These include: broccoli, Brussels sprouts, cabbage, cauliflower, beets, sweet potatoes, white potatoes (skin  on), carrots, tomatoes, eggplant, squash, berries, fresh fruits, and dried fruits.   Cereals appear to be the richest source of fiber. Cereal fiber is found in whole grains and bran. Bran is the fiber-rich outer coat of cereal grain, which is largely removed in refining. In whole-grain cereals, the bran remains. In breakfast cereals, the largest amount of fiber is found in those with "bran" in their names. The fiber content is sometimes indicated on the label.   You may need to include additional fruits and vegetables each day.   In baking, for 1 cup white flour, you may use the following substitutions:   1 cup whole-wheat flour minus 2 tablespoons.   1/2 cup white flour plus 1/2 cup whole-wheat flour.   Diverticulosis Diverticulosis is a common condition that develops when small pouches (diverticula) form in the wall of the colon. The risk of diverticulosis increases with age. It happens more often in people who eat a low-fiber diet. Most individuals with diverticulosis have no symptoms. Those individuals with symptoms usually experience belly  (abdominal) pain, constipation, or loose stools (diarrhea).  HOME CARE INSTRUCTIONS  Increase the amount of fiber in your diet as directed by your caregiver or dietician. This may reduce symptoms of diverticulosis.   Drink at least 6 to 8 glasses of water each day to prevent constipation.   Try not to strain when you have a bowel movement.   Avoiding nuts and seeds to prevent complications is still an uncertain benefit.       FOODS HAVING HIGH FIBER CONTENT INCLUDE:  Fruits. Apple, peach, pear, tangerine, raisins, prunes.   Vegetables. Brussels sprouts, asparagus, broccoli, cabbage, carrot, cauliflower, romaine lettuce, spinach, summer squash, tomato, winter squash, zucchini.   Starchy Vegetables. Baked beans, kidney beans, lima beans, split peas, lentils, potatoes (with skin).   Grains. Whole wheat bread, brown rice, bran flake cereal, plain oatmeal, white rice, shredded wheat, bran muffins.    SEEK IMMEDIATE MEDICAL CARE IF:  You develop increasing pain or severe bloating.   You have an oral temperature above 101F.   You develop vomiting or bowel movements that are bloody or black.   Hemorrhoids Hemorrhoids are dilated (enlarged) veins around the rectum. Sometimes clots will form in the veins. This makes them swollen and painful. These are called thrombosed hemorrhoids. Causes of hemorrhoids include:  Constipation.   Straining to have a bowel movement.   HEAVY LIFTING HOME CARE INSTRUCTIONS  Eat a well balanced diet and drink 6 to 8 glasses of water every day to avoid constipation. You may also use a bulk laxative.   Avoid straining to have bowel movements.   Keep anal area dry and clean.   Do not use a donut shaped pillow or sit on the toilet for long periods. This increases blood pooling and pain.   Move your bowels when your body has the urge; this will require less straining and will decrease pain and pressure.

## 2014-09-18 NOTE — Op Note (Signed)
[facility] [address] [place], [address]   COLONOSCOPY PROCEDURE REPORT  PATIENT: Angela Lawson, Angela Lawson  MR#: [id number] BIRTHDATE: 03/30/1934 , 9  yrs. old GENDER: female ENDOSCOPIST: Barney Drain, MD REFERRED ST:[id number] [name], M.D. PROCEDURE DATE:  09/21/14 PROCEDURE:   Colonoscopy with cold biopsy polypectomy INDICATIONS:average risk for colon cancer. MEDICATIONS: Promethazine (Phenergan) 12.5 mg IV, Demerol 50 mg IV, and Versed 5 mg IV  DESCRIPTION OF PROCEDURE:    Physical exam was performed.  Informed consent was obtained from the patient after explaining the benefits, risks, and alternatives to procedure.  The patient was connected to monitor and placed in left lateral position. Continuous oxygen was provided by nasal cannula and IV medicine administered through an indwelling cannula.  After administration of sedation and rectal exam, the patients rectum was intubated and the EC-[id number] ([id number])  colonoscope was advanced under direct visualization to the cecum.  The scope was removed slowly by carefully examining the color, texture, anatomy, and integrity mucosa on the way out.  The patient was recovered in endoscopy and discharged home in satisfactory condition.    COLON FINDINGS: Two sessile polyps ranging from 2 to 79mm in size were found in the descending colon and ascending colon.  A polypectomy was performed with cold forceps.  , There was moderate diverticulosis noted throughout the entire examined colon with associated angulation, muscular hypertrophy and tortuosity.  , The colon was redundant.  The patient was moved on to their back to reach the cecum, and Moderate sized internal hemorrhoids were found.  PREP QUALITY: good.   CECAL W/D TIME: 18 mins  COMPLICATIONS: None  ENDOSCOPIC IMPRESSION: 1.   Two COLON POLYPS REMOVED 2.   Moderate diverticulosis throughout the entire examined colon 3.   The LEFT colon IS redundant 4.   Moderate  sized internal hemorrhoids  RECOMMENDATIONS: HOLD ASA FOR 5 DAYS AWAIT BIOPSY HIGH FIBER DIET TCS WITH AN OVERTUBE/PEDS SCOPE IN 10-15 YEARS IF THE BENEFITS OUTWEIGH THE RISKS   _______________________________ eSignedBarney Drain, MD Sep 21, 2014 11:29 AM   CPT CODES: ICD CODES:  The ICD and CPT codes recommended by this software are interpretations from the data that the clinical staff has captured with the software.  The verification of the translation of this report to the ICD and CPT codes and modifiers is the sole responsibility of the health care institution and practicing physician where this report was generated.  [employer] will not be held responsible for the validity of the ICD and CPT codes included on this report.  AMA assumes no liability for data contained or not contained herein. C[employer] is a Designer, television/film set of the [employer].

## 2014-09-18 NOTE — H&P (Addendum)
Primary Care Physician:  Rosita Fire, MD Primary Gastroenterologist:  Dr. Oneida Alar  Pre-Procedure History & Physical: HPI:  Angela Lawson is a 80 y.o. female here for [place].  Past Medical History  Diagnosis Date  . Cancer     left lung/ 2009/ surg only  . Hypertension   . Arthritis   . Diverticulitis   . Diabetes mellitus   . H/O ventral hernia   . Lung cancer   . COPD (chronic obstructive pulmonary disease)   . Osteoporosis   . Adenocarcinoma of lung 08/20/2012    Past Surgical History  Procedure Laterality Date  . Cholecystectomy    . Ectopic pregnancy surgery    . Abdominal hysterectomy    . Lung cancer surgery    . Incisional hernia repair N/A 08/26/2013    Procedure: HERNIA REPAIR INCISIONAL WITH MESH;  Surgeon: Jamesetta So, MD;  Location: [facility];  Service: General;  Laterality: N/A;    Prior to Admission medications   Medication Sig Start Date End Date Taking? Authorizing Provider  albuterol (PROAIR HFA) 108 (90 BASE) MCG/ACT inhaler Inhale 2 puffs into the lungs every 4 (four) hours as needed for wheezing or shortness of breath.   Yes Historical Provider, MD  albuterol (PROVENTIL HFA;VENTOLIN HFA) 108 (90 BASE) MCG/ACT inhaler Inhale 2 puffs into the lungs every 2 (two) hours as needed for wheezing or shortness of breath (cough). 09/10/14  Yes Mariea Clonts, MD  aspirin EC 81 MG tablet Take 81 mg by mouth daily.   Yes Historical Provider, MD  benazepril-hydrochlorthiazide (LOTENSIN HCT) 20-12.5 MG per tablet Take 0.5 tablets by mouth daily.    Yes Historical Provider, MD  buPROPion (WELLBUTRIN SR) 150 MG 12 hr tablet Take 150 mg by mouth 2 (two) times daily.   Yes Historical Provider, MD  Calcium Carbonate-Vitamin D (CALCIUM 600 + D PO) Take 1 tablet by mouth daily.   Yes Historical Provider, MD  doxycycline (VIBRAMYCIN) 100 MG capsule Take 1 capsule by mouth 2 (two) times daily. Starting 09/05/2014 x 7 days. 09/05/14  Yes Historical Provider, MD   fish oil-omega-3 fatty acids 1000 MG capsule Take 1 g by mouth daily.   Yes Historical Provider, MD  gabapentin (NEURONTIN) 300 MG capsule Take 300 mg by mouth daily.    Yes Historical Provider, MD  Garlic TABS Take 1 tablet by mouth daily.   Yes Historical Provider, MD  HYDROcodone-acetaminophen (NORCO/VICODIN) 5-325 MG per tablet Take 1 tablet by mouth every 4 (four) hours as needed. 08/17/14  Yes Carole Civil, MD  hydrocortisone 2.5 % ointment Apply 1 application topically 2 (two) times daily as needed (rash).  08/31/14  Yes Historical Provider, MD  ipratropium-albuterol (DUONEB) 0.5-2.5 (3) MG/3ML SOLN Inhale 3 mLs into the lungs every 4 (four) hours as needed (wheezing/shortness of breath).  09/05/14  Yes Historical Provider, MD  meclizine (ANTIVERT) 25 MG tablet Take 1 tablet by mouth 3 (three) times daily. 09/05/14  Yes Historical Provider, MD  nystatin (MYCOSTATIN/NYSTOP) 100000 UNIT/GM POWD Apply to rash/ moist areas under breasts, abdominal folds and back three times a day until healed, keep areas dry. 09/10/14  Yes Mariea Clonts, MD  omeprazole (PRILOSEC) 20 MG capsule Take 20 mg by mouth daily.   Yes Historical Provider, MD  rOPINIRole (REQUIP) 0.25 MG tablet Take 1 tablet by mouth 3 (three) times daily. 09/05/14  Yes Historical Provider, MD  TRADJENTA 5 MG TABS tablet Take 5 mg by mouth daily.  08/10/12  Yes Historical Provider, MD  denosumab (PROLIA) 60 MG/ML SOLN injection Inject 60 mg into the skin every 6 (six) months. Administer in upper arm, thigh, or abdomen    Historical Provider, MD    Allergies as of 08/22/2014  . (No Known Allergies)    Family History  Problem Relation Age of Onset  . Diabetes Mother   . Hypertension Mother   . Diabetes Father   . Asthma    . Cancer    . Colon cancer Neg Hx     History   Social History  . Marital Status: Widowed    Spouse Name: N/A    Number of Children: N/A  . Years of Education: N/A   Occupational History  . Not on  file.   Social History Main Topics  . Smoking status: Former Research scientist (life sciences)  . Smokeless tobacco: Never Used  . Alcohol Use: No  . Drug Use: No  . Sexual Activity: Yes    Birth Control/ Protection: Surgical   Other Topics Concern  . Not on file   Social History Narrative  . No narrative on file    Review of Systems: See HPI, otherwise negative ROS   Physical Exam: BP 154/69  Pulse 93  Temp(Src) 97.8 F (36.6 C) (Oral)  Resp 12  Ht 5\' 4"  (1.626 m)  Wt 165 lb (74.844 kg)  BMI 28.31 kg/m2  SpO2 99% General:   Alert,  pleasant and cooperative in NAD Head:  Normocephalic and atraumatic. Neck:  Supple; Lungs:  INITIAL END EXP WHEEZES IMPROVED AFTER COUGH, FAIR AIR MOVEMENT.    Heart:  Regular rate and rhythm. Abdomen:  Soft, nontender and nondistended. Normal bowel sounds, without guarding, and without rebound.   Neurologic:  Alert and  oriented x4;  grossly normal neurologically.  Impression/Plan:     SCREENING  Plan:  1. TCS TODAY

## 2014-09-19 ENCOUNTER — Encounter (HOSPITAL_COMMUNITY): Payer: Self-pay | Admitting: Gastroenterology

## 2014-09-20 ENCOUNTER — Ambulatory Visit (HOSPITAL_COMMUNITY): Payer: Medicare Other | Admitting: Oncology

## 2014-09-25 NOTE — Progress Notes (Signed)
[name], MD [address] [place] [address]  Adenocarcinoma of lung, unspecified laterality - Plan: CBC with Differential, Comprehensive metabolic panel, CT Chest Low Dose F/U Screening Wo Contrast, CBC with Differential, Comprehensive metabolic panel, clotrimazole-betamethasone (LOTRISONE) cream  Candidal skin infection - Plan: clotrimazole-betamethasone (LOTRISONE) cream  CURRENT THERAPY: Surveillance per NCCN guidelines   INTERVAL HISTORY: Angela Lawson 80 y.o. female returns for  regular  visit for followup of stage I A., non-small cell lung cancer, consistent with a poorly differentiated adenocarcinoma, status post resection 07/07/2008 in [place].  Oncology History   stage I A., non-small cell lung cancer, consistent with a poorly differentiated adenocarcinoma, status post resection 07/07/2008 in [place].       Adenocarcinoma of lung   07/07/2008 Surgery Performed in [place], [place].  Consistent with poorly differentiated adenocarcinoma.   02/27/2014 Imaging CT of chest demonstrating a RLL solid nodular component measuring 9 mm at the site of previous ground-glass opacity. Slight increase in size of an anterior mediastinal node measuring 1.1 cm with stable 1 cm pretracheal lymph node. Consider PET   03/20/2014 Imaging PET scan- No hypermetabolic pulmonary lesions to suggest recurrent or metastatic pulmonary disease.  Weakly positive left internal mammary lymph node and left axillary lymph node (? inflammatory process involving the left breast).   Chart reviewed.   I personally reviewed and went over laboratory results with the patient.  The results are noted within this dictation.  I personally reviewed and went over radiographic studies with the patient.  The results are noted within this dictation.  Mammogram was BIRADS 2 on 04/26/2014.  Additionally, CT of chest on 09/14/2014 demonstrates:  1. The 0.6 by 0.4 cm left upper lobe nodule is stable  back through  02/18/2013. This favors a benign the etiology, although surveillance  is suggested.  2. The right lower lobe indistinctly marginated 1.0 by 0.5 cm nodule  is stable from 03/16/2014 but more solid in appearance compared to  02/18/13. This nodule was not hypermetabolic on the April PET-CT.  3. Severe emphysema. Stable atherosclerosis.  4. Dilated esophagus with air- fluid level, possibly from  dysmotility.  5. 1 cm left internal mammary lymph node is stable in size compared  to the prior exam but very minimally increased in size from  02/18/2013. This was a weakly hypermetabolic on the prior PET-CT and  likewise warrants observation.  With this information, we will go back to surveillance per NCCN guidelines.   Chart is reviewed and I note that she reported to the ED on 09/10/2014 due to rash.  She was given Nystatin powder.  She reports her rash is not improved.  See exam for details.  She does have diabetes and she does not know how well her sugar is controlled at this time.  I have encouraged her to follow-up with Dr. Legrand Rams.  Oncologically, she denies any complaints and ROS questioning is negative.  Past Medical History  Diagnosis Date  . Cancer     left lung/ 2009/ surg only  . Hypertension   . Arthritis   . Diverticulitis   . Diabetes mellitus   . H/O ventral hernia   . Lung cancer   . COPD (chronic obstructive pulmonary disease)   . Osteoporosis   . Adenocarcinoma of lung 08/20/2012    has Adenocarcinoma of lung; Bursitis of left shoulder; Non-traumatic tear of right rotator cuff; Pain in joint, shoulder region; and Muscle weakness (generalized) on her problem list.  has No Known Allergies.  [name] had no medications administered during this visit.  Past Surgical History  Procedure Laterality Date  . Cholecystectomy    . Ectopic pregnancy surgery    . Abdominal hysterectomy    . Lung cancer surgery    . Incisional hernia repair N/A 08/26/2013     Procedure: HERNIA REPAIR INCISIONAL WITH MESH;  Surgeon: Jamesetta So, MD;  Location: [facility];  Service: General;  Laterality: N/A;  . Colonoscopy N/A 09/18/2014    Procedure: COLONOSCOPY;  Surgeon: Danie Binder, MD;  Location: [facility];  Service: Endoscopy;  Laterality: N/A;  8:30 AM - moved to 10:30 Rosendo Gros to notify pt    Denies any headaches, dizziness, double vision, fevers, chills, night sweats, nausea, vomiting, diarrhea, constipation, chest pain, heart palpitations, blood in stool, black tarry stool, urinary pain, urinary burning, urinary frequency, hematuria.   PHYSICAL EXAMINATION  ECOG PERFORMANCE STATUS: 1 - Symptomatic but completely ambulatory  Filed Vitals:   09/27/14 1217  BP: 122/70  Pulse: 104  Temp: 98 F (36.7 C)  Resp: 22    GENERAL:alert, no distress, well nourished, well developed, comfortable, cooperative, obese and smiling SKIN: skin color, texture, turgor are normal, rash in the inframammary folds crossing midline following her bra line that is demarcated, thickened and hyperpigmented.  HEAD: Normocephalic, No masses, lesions, tenderness or abnormalities EYES: normal, PERRLA, EOMI, Conjunctiva are pink and non-injected EARS: External ears normal OROPHARYNX:lips, buccal mucosa, and tongue normal and mucous membranes are moist  NECK: supple, no adenopathy, thyroid normal size, non-tender, without nodularity, no stridor, non-tender, trachea midline LYMPH:  not examined BREAST:not examined LUNGS: decreased breath sounds HEART: regular rate & rhythm, no murmurs, no gallops, S1 normal and S2 normal ABDOMEN:abdomen soft, non-tender, obese, normal bowel sounds and no masses or organomegaly BACK: Back symmetric, no curvature., Midline hyperpigmented rash that is clearly demarcated. EXTREMITIES:less then 2 second capillary refill, no joint deformities, effusion, or inflammation, no skin discoloration, no cyanosis  NEURO: alert & oriented x 3 with fluent  speech, no focal motor/sensory deficits, gait normal   LABORATORY DATA: CBC    Component Value Date/Time   WBC 6.3 09/27/2014 1220   RBC 4.62 09/27/2014 1220   HGB 13.5 09/27/2014 1220   HCT 42.0 09/27/2014 1220   PLT 242 09/27/2014 1220   MCV 90.9 09/27/2014 1220   MCH 29.2 09/27/2014 1220   MCHC 32.1 09/27/2014 1220   RDW 14.8 09/27/2014 1220   LYMPHSABS 1.6 09/27/2014 1220   MONOABS 0.7 09/27/2014 1220   EOSABS 0.6 09/27/2014 1220   BASOSABS 0.0 09/27/2014 1220      Chemistry      Component Value Date/Time   NA 137 04/03/2014 2118   K 4.8 04/03/2014 2118   CL 96 04/03/2014 2118   CO2 30 04/03/2014 2118   BUN 19 04/03/2014 2118   CREATININE 1.42* 04/03/2014 2118      Component Value Date/Time   CALCIUM 10.1 04/03/2014 2118   CALCIUM 10.0 08/20/2012 1408   ALKPHOS 67 02/27/2014 0837   AST 18 02/27/2014 0837   ALT 12 02/27/2014 0837   BILITOT 0.4 02/27/2014 0837        RADIOGRAPHIC STUDIES:  09/14/2014  CLINICAL DATA: Left upper lobe nodule. Non-small cell  adenocarcinoma of the lung. Shortness of breath. Diabetes.  Hypertension.  EXAM:  CT CHEST WITHOUT CONTRAST  TECHNIQUE:  Multidetector CT imaging of the chest was performed following the  standard protocol without IV contrast.  COMPARISON: Multiple exams, including 03/16/2014 and 02/18/2013  FINDINGS:  The 0.6 by 0.4 cm left upper lobe nodule is shown on image 16 of  series 4 to be unchanged compared to 02/18/13. This documents 19  months of stability.  3 mm stable subpleural nodule, left upper lobe, image 11 series 4.  Stable scarring and volume loss in the left lower lobe. There is  airway thickening in both lower lobes and some airway plugging in  the medial basal segment right lower lobe on image 41 of series 4.  Persistent indistinctly marginated 1.0 by 0.5 cm right lower lobe  nodule, image 34 series 4, stable from 03/16/2014 but more solid in  appearance compared to 02/18/13.  Severe emphysema. Mildly  asymmetric right apical pleuroparenchymal  scarring.  Stable atherosclerosis. Mildly dilated distal esophagus with  air-fluid level. 1 cm left internal mammary node, image 45 of series  3, previously 0.8 cm on 02/18/2013. This node was reported as weakly  hypermetabolic on the prior PET-CT no other pathologic mediastinal  adenopathy observed.  IMPRESSION:  1. The 0.6 by 0.4 cm left upper lobe nodule is stable back through  02/18/2013. This favors a benign the etiology, although surveillance  is suggested.  2. The right lower lobe indistinctly marginated 1.0 by 0.5 cm nodule  is stable from 03/16/2014 but more solid in appearance compared to  02/18/13. This nodule was not hypermetabolic on the April PET-CT.  3. Severe emphysema. Stable atherosclerosis.  4. Dilated esophagus with air- fluid level, possibly from  dysmotility.  5. 1 cm left internal mammary lymph node is stable in size compared  to the prior exam but very minimally increased in size from  02/18/2013. This was a weakly hypermetabolic on the prior PET-CT and  likewise warrants observation.  Electronically Signed  By: Sherryl Barters M.D.  On: 09/14/2014 12:00    PATHOLOGY:  09/18/2014  Diagnosis 1. Colon, polyp(s), ascending - TUBULAR ADENOMA. - NO HIGH GRADE DYSPLASIA OR MALIGNANCY. 2. Colon, polyp(s), descending - TUBULAR ADENOMA. - NO HIGH GRADE DYSPLASIA OR MALIGNANCY. Vicente Males MD Pathologist, Electronic Signature (Case signed 09/19/2014)     ASSESSMENT:  1. Stage I A., non-small cell lung cancer, consistent with a poorly differentiated adenocarcinoma, status post resection 07/07/2008 in [place].  2. COPD, followed by Dr. Luan Pulling.  3. Diabetes mellitus  4. Hypertension  5. Osteoporosis on bone density exam in August 2013. Will defer treatment to PCP. Next bone density is due in August 2015.  6. Chronic renal disease, G3b with GFR of 44 on 02/27/2014  7. Umbilical hernia, repaired by Dr.  Arnoldo Morale with mesh on 08/26/2013  8. Right shoulder supraspinatus and infraspinatus complete tears, requiring surgical intervention per Dr. Aline Brochure.  9. Left breast abnormality on PET scan imaging, but negative mammography and stable CT of chest.  10. Candida infection of skin following inframammary folds and bra line.  Patient Active Problem List   Diagnosis Date Noted  . Non-traumatic tear of right rotator cuff 11/30/2013  . Pain in joint, shoulder region 11/30/2013  . Muscle weakness (generalized) 11/30/2013  . Bursitis of left shoulder 02/09/2013  . Adenocarcinoma of lung 08/20/2012    PLAN:  1. I personally reviewed and went over laboratory results with the patient.  The results are noted within this dictation. 2. I personally reviewed and went over radiographic studies with the patient.  The results are noted within this dictation.   3. I personally reviewed and went over pathology results with the  patient. 4. CT chest wo contrast in 12 months 5. Labs in 12 months: CBC diff, CMET 6. Rx for Lotrisone cream to be applied to affected area. 7. Follow-up with endocrinologist and primary care provider as directed 8. Return in 12 months for follow-up   THERAPY PLAN:  NCCN guidelines for Non-Small Cell Lung Cancer Surveillance are as follows:  A. H+P every 6-12 months and CT of chest every 6 months for the first two years  B. Low-dose spiral CT chest annually after first two years of surveillance CTs   C. PET scan not typically indicated for surveillance  D. Smoking cessation advice, counseling, and pharmacotherapy   All questions were answered. The patient knows to call the clinic with any problems, questions or concerns. We can certainly see the patient much sooner if necessary.  Patient and plan discussed with Dr. Farrel Gobble and he is in agreement with the aforementioned.   KEFALAS,THOMAS 09/27/2014

## 2014-09-27 ENCOUNTER — Encounter (HOSPITAL_COMMUNITY): Payer: Medicare Other | Attending: Oncology | Admitting: Oncology

## 2014-09-27 ENCOUNTER — Encounter (HOSPITAL_COMMUNITY): Payer: Self-pay | Admitting: Oncology

## 2014-09-27 ENCOUNTER — Encounter (HOSPITAL_BASED_OUTPATIENT_CLINIC_OR_DEPARTMENT_OTHER): Payer: Medicare Other

## 2014-09-27 VITALS — BP 122/70 | HR 104 | Temp 98.0°F | Resp 22 | Wt 173.0 lb

## 2014-09-27 DIAGNOSIS — I10 Essential (primary) hypertension: Secondary | ICD-10-CM

## 2014-09-27 DIAGNOSIS — Z85118 Personal history of other malignant neoplasm of bronchus and lung: Secondary | ICD-10-CM | POA: Diagnosis not present

## 2014-09-27 DIAGNOSIS — N189 Chronic kidney disease, unspecified: Secondary | ICD-10-CM | POA: Diagnosis not present

## 2014-09-27 DIAGNOSIS — B372 Candidiasis of skin and nail: Secondary | ICD-10-CM | POA: Diagnosis not present

## 2014-09-27 DIAGNOSIS — E119 Type 2 diabetes mellitus without complications: Secondary | ICD-10-CM | POA: Insufficient documentation

## 2014-09-27 DIAGNOSIS — Z90710 Acquired absence of both cervix and uterus: Secondary | ICD-10-CM | POA: Insufficient documentation

## 2014-09-27 DIAGNOSIS — Z9049 Acquired absence of other specified parts of digestive tract: Secondary | ICD-10-CM | POA: Insufficient documentation

## 2014-09-27 DIAGNOSIS — M75101 Unspecified rotator cuff tear or rupture of right shoulder, not specified as traumatic: Secondary | ICD-10-CM | POA: Insufficient documentation

## 2014-09-27 DIAGNOSIS — J449 Chronic obstructive pulmonary disease, unspecified: Secondary | ICD-10-CM | POA: Insufficient documentation

## 2014-09-27 DIAGNOSIS — R599 Enlarged lymph nodes, unspecified: Secondary | ICD-10-CM | POA: Diagnosis not present

## 2014-09-27 DIAGNOSIS — I129 Hypertensive chronic kidney disease with stage 1 through stage 4 chronic kidney disease, or unspecified chronic kidney disease: Secondary | ICD-10-CM | POA: Diagnosis not present

## 2014-09-27 DIAGNOSIS — Z79899 Other long term (current) drug therapy: Secondary | ICD-10-CM | POA: Diagnosis not present

## 2014-09-27 DIAGNOSIS — M810 Age-related osteoporosis without current pathological fracture: Secondary | ICD-10-CM | POA: Insufficient documentation

## 2014-09-27 DIAGNOSIS — C3490 Malignant neoplasm of unspecified part of unspecified bronchus or lung: Secondary | ICD-10-CM

## 2014-09-27 DIAGNOSIS — Z08 Encounter for follow-up examination after completed treatment for malignant neoplasm: Secondary | ICD-10-CM | POA: Diagnosis not present

## 2014-09-27 LAB — COMPREHENSIVE METABOLIC PANEL
ALT: 11 U/L (ref 0–35)
AST: 16 U/L (ref 0–37)
Albumin: 3.7 g/dL (ref 3.5–5.2)
Alkaline Phosphatase: 66 U/L (ref 39–117)
Anion gap: 9 (ref 5–15)
BUN: 21 mg/dL (ref 6–23)
CO2: 28 mEq/L (ref 19–32)
Calcium: 10 mg/dL (ref 8.4–10.5)
Chloride: 99 mEq/L (ref 96–112)
Creatinine, Ser: 1.37 mg/dL — ABNORMAL HIGH (ref 0.50–1.10)
GFR calc Af Amer: 42 mL/min — ABNORMAL LOW (ref >90)
GFR calc non Af Amer: 36 mL/min — ABNORMAL LOW (ref >90)
Glucose, Bld: 104 mg/dL — ABNORMAL HIGH (ref 70–99)
Potassium: 5 mEq/L (ref 3.7–5.3)
Sodium: 136 mEq/L — ABNORMAL LOW (ref 137–147)
Total Bilirubin: 0.2 mg/dL — ABNORMAL LOW (ref 0.3–1.2)
Total Protein: 7.7 g/dL (ref 6.0–8.3)

## 2014-09-27 LAB — CBC WITH DIFFERENTIAL/PLATELET
Basophils Absolute: 0 10*3/uL (ref 0.0–0.1)
Basophils Relative: 1 % (ref 0–1)
Eosinophils Absolute: 0.6 10*3/uL (ref 0.0–0.7)
Eosinophils Relative: 9 % — ABNORMAL HIGH (ref 0–5)
HCT: 42 % (ref 36.0–46.0)
Hemoglobin: 13.5 g/dL (ref 12.0–15.0)
Lymphocytes Relative: 26 % (ref 12–46)
Lymphs Abs: 1.6 10*3/uL (ref 0.7–4.0)
MCH: 29.2 pg (ref 26.0–34.0)
MCHC: 32.1 g/dL (ref 30.0–36.0)
MCV: 90.9 fL (ref 78.0–100.0)
Monocytes Absolute: 0.7 10*3/uL (ref 0.1–1.0)
Monocytes Relative: 11 % (ref 3–12)
Neutro Abs: 3.3 10*3/uL (ref 1.7–7.7)
Neutrophils Relative %: 53 % (ref 43–77)
Platelets: 242 10*3/uL (ref 150–400)
RBC: 4.62 MIL/uL (ref 3.87–5.11)
RDW: 14.8 % (ref 11.5–15.5)
WBC: 6.3 10*3/uL (ref 4.0–10.5)

## 2014-09-27 MED ORDER — CLOTRIMAZOLE-BETAMETHASONE 1-0.05 % EX CREA: 1.0000 "application " | TOPICAL_CREAM | Freq: Two times a day (BID) | CUTANEOUS | Status: DC

## 2014-09-27 NOTE — Patient Instructions (Addendum)
[facility] Discharge Instructions  RECOMMENDATIONS MADE BY THE CONSULTANT AND ANY TEST RESULTS WILL BE SENT TO YOUR REFERRING PHYSICIAN.  Return in one year for lab work and office visit; we will schedule a CT scan for you prior to this visit.  Thank you for choosing [facility] to provide your oncology and hematology care.  To afford each patient quality time with our providers, please arrive at least 15 minutes before your scheduled appointment time.  With your help, our goal is to use those 15 minutes to complete the necessary work-up to ensure our physicians have the information they need to help with your evaluation and healthcare recommendations.    Effective January 1st, 2014, we ask that you re-schedule your appointment with our physicians should you arrive 10 or more minutes late for your appointment.  We strive to give you quality time with our providers, and arriving late affects you and other patients whose appointments are after yours.    Again, thank you for choosing [facility].  Our hope is that these requests will decrease the amount of time that you wait before being seen by our physicians.       _____________________________________________________________  Should you have questions after your visit to [facility], please contact our office at [phone] between the hours of 8:30 a.m. and 4:30 p.m.  Voicemails left after 4:30 p.m. will not be returned until the following business day.  For prescription refill requests, have your pharmacy contact our office with your prescription refill request.    _______________________________________________________________  We hope that we have given you very good care.  You may receive a patient satisfaction survey in the mail, please complete it and return it as soon as possible.  We value your  feedback!  _______________________________________________________________  Have you asked about our STAR program?  STAR stands for Survivorship Training and Rehabilitation, and this is a nationally recognized cancer care program that focuses on survivorship and rehabilitation.  Cancer and cancer treatments may cause problems, such as, pain, making you feel tired and keeping you from doing the things that you need or want to do. Cancer rehabilitation can help. Our goal is to reduce these troubling effects and help you have the best quality of life possible.  You may receive a survey from a nurse that asks questions about your current state of health.  Based on the survey results, all eligible patients will be referred to the [facility] program for an evaluation so we can better serve you!  A frequently asked questions sheet is available upon request.

## 2014-09-27 NOTE — Progress Notes (Signed)
LABS FOR CMP,CBCD

## 2014-09-28 ENCOUNTER — Ambulatory Visit (INDEPENDENT_AMBULATORY_CARE_PROVIDER_SITE_OTHER): Payer: Medicare Other

## 2014-09-28 ENCOUNTER — Ambulatory Visit (INDEPENDENT_AMBULATORY_CARE_PROVIDER_SITE_OTHER): Payer: Self-pay | Admitting: Orthopedic Surgery

## 2014-09-28 VITALS — BP 98/47 | Ht 64.0 in | Wt 173.0 lb

## 2014-09-28 DIAGNOSIS — S92911A Unspecified fracture of right toe(s), initial encounter for closed fracture: Secondary | ICD-10-CM

## 2014-09-28 DIAGNOSIS — S92919A Unspecified fracture of unspecified toe(s), initial encounter for closed fracture: Secondary | ICD-10-CM | POA: Insufficient documentation

## 2014-09-28 NOTE — Progress Notes (Signed)
Patient ID: Angela Lawson, female   DOB: [birth year], 80 y.o.   MRN: [id number]  Encounter Diagnosis  Name Primary?  Marland Kitchen Toe fracture, right, closed, initial encounter Yes    Chief Complaint  Patient presents with  . Follow-up    2 week recheck and xray Rt 2nd toe fx, DOI 08/05/14    My interpretation of today's x-rays is that the fracture is stable there is slight vertical or axial impaction with some displacement of the fibular side fragment but this is inconsequential in this patient based on her age activity level and overall foot alignment.  Follow up as needed activities as tolerated

## 2014-09-28 NOTE — Patient Instructions (Signed)
Activity as tolerated

## 2014-10-03 ENCOUNTER — Telehealth: Payer: Self-pay | Admitting: Gastroenterology

## 2014-10-03 NOTE — Telephone Encounter (Signed)
Tried to call with no answer  

## 2014-10-03 NOTE — Telephone Encounter (Signed)
Reminder in epic °

## 2014-10-03 NOTE — Telephone Encounter (Signed)
Please call pt. She had simple adenomas removed.   FOLLOW A HIGH FIBER DIET. AVOID ITEMS THAT CAUSE BLOATING.Marland Kitchen  Next colonoscopy in 10-15 years if the benefits outweigh the risks.Marland Kitchen

## 2014-10-04 NOTE — Telephone Encounter (Signed)
Pt is aware of results. 

## 2014-11-01 ENCOUNTER — Other Ambulatory Visit (HOSPITAL_COMMUNITY): Payer: Self-pay | Admitting: Internal Medicine

## 2014-11-01 DIAGNOSIS — M810 Age-related osteoporosis without current pathological fracture: Secondary | ICD-10-CM

## 2014-11-08 ENCOUNTER — Ambulatory Visit (HOSPITAL_COMMUNITY)
Admission: RE | Admit: 2014-11-08 | Discharge: 2014-11-08 | Disposition: A | Discharge status: Home / Self Care | Payer: Medicare Other | Source: Ambulatory Visit | Attending: Internal Medicine | Admitting: Internal Medicine

## 2014-11-08 DIAGNOSIS — M810 Age-related osteoporosis without current pathological fracture: Secondary | ICD-10-CM

## 2014-11-24 ENCOUNTER — Encounter: Payer: Self-pay | Admitting: Obstetrics and Gynecology

## 2014-11-24 ENCOUNTER — Ambulatory Visit (INDEPENDENT_AMBULATORY_CARE_PROVIDER_SITE_OTHER): Payer: PRIVATE HEALTH INSURANCE | Admitting: Obstetrics and Gynecology

## 2014-11-24 VITALS — BP 104/52 | Ht 64.0 in | Wt 169.4 lb

## 2014-11-24 DIAGNOSIS — A6009 Herpesviral infection of other urogenital tract: Secondary | ICD-10-CM | POA: Insufficient documentation

## 2014-11-24 DIAGNOSIS — Z113 Encounter for screening for infections with a predominantly sexual mode of transmission: Secondary | ICD-10-CM

## 2014-11-24 DIAGNOSIS — Z1159 Encounter for screening for other viral diseases: Secondary | ICD-10-CM

## 2014-11-24 DIAGNOSIS — A609 Anogenital herpesviral infection, unspecified: Secondary | ICD-10-CM

## 2014-11-24 DIAGNOSIS — Z118 Encounter for screening for other infectious and parasitic diseases: Secondary | ICD-10-CM

## 2014-11-24 MED ORDER — ACYCLOVIR 400 MG PO TABS: 400.0000 mg | ORAL_TABLET | Freq: Every day | ORAL | Status: DC

## 2014-11-24 MED ORDER — METRONIDAZOLE 500 MG PO TABS: 500.0000 mg | ORAL_TABLET | Freq: Two times a day (BID) | ORAL | Status: DC

## 2014-11-24 NOTE — Progress Notes (Signed)
[facility] Visit  Patient name: Angela Lawson MRN [id number]  Date of birth: [birth year]  CC & HPI:  Angela Lawson is a 80 y.o. female presenting today for vulvar swelling onset 1 week. She reports that her PCP Dr. Legrand Rams sent her here to check on the area. She denies dizziness, lightheadedness, sores in her mouth or anywhere else and any other associated symptoms. She reports that she is not sexually active and she is a widow. She reports that when she wakes up, there is more swelling in the morning.  ROS:  +Vulvar Swelling +Vulvar Tenderness No other complaints.  Pertinent History Reviewed:   Reviewed: Significant for  Medical         Past Medical History  Diagnosis Date  . Cancer     left lung/ 2009/ surg only  . Hypertension   . Arthritis   . Diverticulitis   . Diabetes mellitus   . H/O ventral hernia   . Lung cancer   . COPD (chronic obstructive pulmonary disease)   . Osteoporosis   . Adenocarcinoma of lung 08/20/2012                              Surgical Hx:    Past Surgical History  Procedure Laterality Date  . Cholecystectomy    . Ectopic pregnancy surgery    . Abdominal hysterectomy    . Lung cancer surgery    . Incisional hernia repair N/A 08/26/2013    Procedure: HERNIA REPAIR INCISIONAL WITH MESH;  Surgeon: Jamesetta So, MD;  Location: [facility];  Service: General;  Laterality: N/A;  . Colonoscopy N/A 09/18/2014    Procedure: COLONOSCOPY;  Surgeon: Danie Binder, MD;  Location: [facility];  Service: Endoscopy;  Laterality: N/A;  8:30 AM - moved to 10:30 Rosendo Gros to notify pt   Medications: Reviewed & Updated - see associated section                      Current outpatient prescriptions: albuterol (PROAIR HFA) 108 (90 BASE) MCG/ACT inhaler, Inhale 2 puffs into the lungs every 4 (four) hours as needed for wheezing or shortness of breath., Disp: , Rfl: ;  benazepril-hydrochlorthiazide (LOTENSIN HCT) 20-12.5 MG per tablet, Take 0.5 tablets by mouth  daily. , Disp: , Rfl: ;  buPROPion (WELLBUTRIN SR) 150 MG 12 hr tablet, Take 150 mg by mouth 2 (two) times daily., Disp: , Rfl:  Calcium Carbonate-Vitamin D (CALCIUM 600 + D PO), Take 1 tablet by mouth daily., Disp: , Rfl: ;  clotrimazole-betamethasone (LOTRISONE) cream, Apply 1 application topically 2 (two) times daily. Apply to rash, Disp: 30 g, Rfl: 2;  denosumab (PROLIA) 60 MG/ML SOLN injection, Inject 60 mg into the skin every 6 (six) months. Administer in upper arm, thigh, or abdomen, Disp: , Rfl:  fish oil-omega-3 fatty acids 1000 MG capsule, Take 1 g by mouth daily., Disp: , Rfl: ;  gabapentin (NEURONTIN) 300 MG capsule, Take 300 mg by mouth daily. , Disp: , Rfl: ;  Garlic TABS, Take 1 tablet by mouth daily., Disp: , Rfl: ;  HYDROcodone-acetaminophen (NORCO/VICODIN) 5-325 MG per tablet, Take 1 tablet by mouth every 4 (four) hours as needed., Disp: 84 tablet, Rfl: 0 hydrocortisone 2.5 % ointment, Apply 1 application topically 2 (two) times daily as needed (rash). , Disp: , Rfl: ;  ipratropium-albuterol (DUONEB) 0.5-2.5 (3) MG/3ML SOLN, Inhale 3 mLs  into the lungs every 4 (four) hours as needed (wheezing/shortness of breath). , Disp: , Rfl: ;  meclizine (ANTIVERT) 25 MG tablet, Take 1 tablet by mouth 3 (three) times daily., Disp: , Rfl:  omeprazole (PRILOSEC) 20 MG capsule, Take 20 mg by mouth daily., Disp: , Rfl: ;  rOPINIRole (REQUIP) 0.25 MG tablet, Take 1 tablet by mouth 3 (three) times daily., Disp: , Rfl: ;  TRADJENTA 5 MG TABS tablet, Take 5 mg by mouth daily. , Disp: , Rfl: ;  ARCAPTA NEOHALER 75 MCG CAPS, , Disp: , Rfl:  nystatin (MYCOSTATIN/NYSTOP) 100000 UNIT/GM POWD, Apply to rash/ moist areas under breasts, abdominal folds and back three times a day until healed, keep areas dry., Disp: 30 g, Rfl: 0   Social History: Reviewed -  reports that she has quit smoking. She has never used smokeless tobacco.  Objective Findings:  Vitals: Blood pressure 104/52, height 5\' 4"  (1.626 m), weight 169  lb 6.4 oz (76.839 kg).  Physical Examination:  Pelvic - normal external genitalia, vulva, vagina, cervix, uterus and adnexa,  VULVA: vulvar tenderness, vulvar erythema, vulvar lesion, cluster of erythematous bloody lesions vesicles  In periclitoral area, with purulent d/c  Assessment & Plan:   A:  1. Vulvar Swelling  2. 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 HSV-2 culture to be completed in office today. 2. Acyclovir  And Abx Rx flagyl 3. F/U for results cultures   This chart was scribed for Jonnie Kind, MD by Steva Colder, ED Scribe. The patient was seen in room 2 at 10:43 AM.

## 2014-11-24 NOTE — Addendum Note (Signed)
Addended by: Traci Sermon A on: 11/24/2014 11:16 AM   Modules accepted: Orders

## 2014-11-25 LAB — GC/CHLAMYDIA PROBE AMP
CT Probe RNA: NEGATIVE
GC Probe RNA: NEGATIVE

## 2014-11-29 LAB — HERPES SIMPLEX VIRUS CULTURE: Organism ID, Bacteria: NOT DETECTED

## 2014-12-15 ENCOUNTER — Ambulatory Visit: Payer: PRIVATE HEALTH INSURANCE | Admitting: Obstetrics and Gynecology

## 2014-12-25 DIAGNOSIS — J449 Chronic obstructive pulmonary disease, unspecified: Secondary | ICD-10-CM | POA: Diagnosis not present

## 2015-01-10 ENCOUNTER — Emergency Department (HOSPITAL_COMMUNITY)
Admission: EM | Admit: 2015-01-10 | Discharge: 2015-01-11 | Disposition: A | Discharge status: Home / Self Care | Payer: Medicare Other | Attending: Emergency Medicine | Admitting: Emergency Medicine

## 2015-01-10 ENCOUNTER — Emergency Department (HOSPITAL_COMMUNITY): Payer: Medicare Other

## 2015-01-10 ENCOUNTER — Encounter (HOSPITAL_COMMUNITY): Payer: Self-pay | Admitting: Emergency Medicine

## 2015-01-10 DIAGNOSIS — M1990 Unspecified osteoarthritis, unspecified site: Secondary | ICD-10-CM | POA: Diagnosis not present

## 2015-01-10 DIAGNOSIS — J441 Chronic obstructive pulmonary disease with (acute) exacerbation: Secondary | ICD-10-CM | POA: Insufficient documentation

## 2015-01-10 DIAGNOSIS — R0789 Other chest pain: Secondary | ICD-10-CM | POA: Diagnosis not present

## 2015-01-10 DIAGNOSIS — Z87891 Personal history of nicotine dependence: Secondary | ICD-10-CM | POA: Diagnosis not present

## 2015-01-10 DIAGNOSIS — Z8719 Personal history of other diseases of the digestive system: Secondary | ICD-10-CM | POA: Diagnosis not present

## 2015-01-10 DIAGNOSIS — Z7982 Long term (current) use of aspirin: Secondary | ICD-10-CM | POA: Insufficient documentation

## 2015-01-10 DIAGNOSIS — R0602 Shortness of breath: Secondary | ICD-10-CM | POA: Diagnosis not present

## 2015-01-10 DIAGNOSIS — E119 Type 2 diabetes mellitus without complications: Secondary | ICD-10-CM | POA: Insufficient documentation

## 2015-01-10 DIAGNOSIS — Z7952 Long term (current) use of systemic steroids: Secondary | ICD-10-CM | POA: Insufficient documentation

## 2015-01-10 DIAGNOSIS — R000 Tachycardia, unspecified: Secondary | ICD-10-CM | POA: Diagnosis not present

## 2015-01-10 DIAGNOSIS — Z85118 Personal history of other malignant neoplasm of bronchus and lung: Secondary | ICD-10-CM | POA: Insufficient documentation

## 2015-01-10 DIAGNOSIS — Z79899 Other long term (current) drug therapy: Secondary | ICD-10-CM | POA: Diagnosis not present

## 2015-01-10 LAB — CBC WITH DIFFERENTIAL/PLATELET
Basophils Absolute: 0 10*3/uL (ref 0.0–0.1)
Basophils Relative: 0 % (ref 0–1)
Eosinophils Absolute: 0.5 10*3/uL (ref 0.0–0.7)
Eosinophils Relative: 5 % (ref 0–5)
HCT: 39.7 % (ref 36.0–46.0)
Hemoglobin: 12.5 g/dL (ref 12.0–15.0)
Lymphocytes Relative: 18 % (ref 12–46)
Lymphs Abs: 1.5 10*3/uL (ref 0.7–4.0)
MCH: 29.7 pg (ref 26.0–34.0)
MCHC: 31.5 g/dL (ref 30.0–36.0)
MCV: 94.3 fL (ref 78.0–100.0)
Monocytes Absolute: 0.6 10*3/uL (ref 0.1–1.0)
Monocytes Relative: 8 % (ref 3–12)
Neutro Abs: 5.9 10*3/uL (ref 1.7–7.7)
Neutrophils Relative %: 69 % (ref 43–77)
Platelets: 210 10*3/uL (ref 150–400)
RBC: 4.21 MIL/uL (ref 3.87–5.11)
RDW: 14.9 % (ref 11.5–15.5)
WBC: 8.5 10*3/uL (ref 4.0–10.5)

## 2015-01-10 LAB — COMPREHENSIVE METABOLIC PANEL
ALT: 18 U/L (ref 0–35)
AST: 21 U/L (ref 0–37)
Albumin: 3.4 g/dL — ABNORMAL LOW (ref 3.5–5.2)
Alkaline Phosphatase: 58 U/L (ref 39–117)
Anion gap: 3 — ABNORMAL LOW (ref 5–15)
BUN: 18 mg/dL (ref 6–23)
CO2: 29 mmol/L (ref 19–32)
Calcium: 9.4 mg/dL (ref 8.4–10.5)
Chloride: 105 mmol/L (ref 96–112)
Creatinine, Ser: 1.37 mg/dL — ABNORMAL HIGH (ref 0.50–1.10)
GFR calc Af Amer: 42 mL/min — ABNORMAL LOW (ref >90)
GFR calc non Af Amer: 36 mL/min — ABNORMAL LOW (ref >90)
Glucose, Bld: 166 mg/dL — ABNORMAL HIGH (ref 70–99)
Potassium: 4.5 mmol/L (ref 3.5–5.1)
Sodium: 137 mmol/L (ref 135–145)
Total Bilirubin: 0.2 mg/dL — ABNORMAL LOW (ref 0.3–1.2)
Total Protein: 6.5 g/dL (ref 6.0–8.3)

## 2015-01-10 LAB — LIPASE, BLOOD: Lipase: 48 U/L (ref 11–59)

## 2015-01-10 LAB — TROPONIN I: Troponin I: 0.03 ng/mL (ref <0.031)

## 2015-01-10 LAB — BRAIN NATRIURETIC PEPTIDE: B Natriuretic Peptide: 80 pg/mL (ref 0.0–100.0)

## 2015-01-10 MED ORDER — IPRATROPIUM-ALBUTEROL 0.5-2.5 (3) MG/3ML IN SOLN
3.0000 mL | Freq: Once | RESPIRATORY_TRACT | Status: AC
  Administered 2015-01-10: 3 mL via RESPIRATORY_TRACT
  Filled 2015-01-10: qty 3

## 2015-01-10 MED ORDER — METHYLPREDNISOLONE SODIUM SUCC 125 MG IJ SOLR
125.0000 mg | INTRAMUSCULAR | Status: AC
  Administered 2015-01-10: 125 mg via INTRAVENOUS
  Filled 2015-01-10: qty 2

## 2015-01-10 NOTE — ED Provider Notes (Signed)
CSN: [id number]     Arrival date & time 01/10/15  2117 History  This chart was scribed for Carmin Muskrat, MD by Edison Simon, ED Scribe. This patient was seen in room [facility] and the patient's care was started at 10:03 PM.    Chief Complaint  Patient presents with  . Shortness of Breath   The history is provided by the patient. No language interpreter was used.    HPI Comments: Angela Lawson is a 81 y.o. female with history of emphysema and asthma who presents to the Emergency Department complaining of intermittent SOB with onset 1 week ago. She states it has been improving somewhat with rest and is worse with exertion. She reports associated chest tightness, intermittent pain to her left rib area, and headache. She denies pleuritic pain. She states she was doing well prior to onset 1 week ago. She denies recent cough, cold, fever, new medicine, new diet, or other significant changes. She states she uses 2L O2 as needed at home and at night for emphysema, COPD, and asthma. She states she had some asthma as a child, but it is worse now. She states she uses breathing treatments QID at home. She states symptoms improved here after breathing treatment. She reports lung CA and surgery in 2009 with annual follows ups which have been benign. Daughter notes there is another spot that was found which is followed but has not needed treatment. She denies prior MIs, stents, or blood clots. She denies fever, vomiting, confusion, syncope, or weight change.  Past Medical History  Diagnosis Date  . Cancer     left lung/ 2009/ surg only  . Hypertension   . Arthritis   . Diverticulitis   . Diabetes mellitus   . H/O ventral hernia   . Lung cancer   . COPD (chronic obstructive pulmonary disease)   . Osteoporosis   . Adenocarcinoma of lung 08/20/2012   Past Surgical History  Procedure Laterality Date  . Cholecystectomy    . Ectopic pregnancy surgery    . Abdominal hysterectomy    . Lung cancer surgery     . Incisional hernia repair N/A 08/26/2013    Procedure: HERNIA REPAIR INCISIONAL WITH MESH;  Surgeon: Jamesetta So, MD;  Location: [facility];  Service: General;  Laterality: N/A;  . Colonoscopy N/A 09/18/2014    Procedure: COLONOSCOPY;  Surgeon: Danie Binder, MD;  Location: [facility];  Service: Endoscopy;  Laterality: N/A;  8:30 AM - moved to 10:30 Rosendo Gros to notify pt   Family History  Problem Relation Age of Onset  . Diabetes Mother   . Hypertension Mother   . Diabetes Father   . Asthma    . Cancer    . Colon cancer Neg Hx    History  Substance Use Topics  . Smoking status: Former Research scientist (life sciences)  . Smokeless tobacco: Never Used  . Alcohol Use: No   OB History    No data available     Review of Systems  Constitutional: Negative for fever and unexpected weight change.       Per HPI, otherwise negative  HENT:       Per HPI, otherwise negative  Respiratory: Positive for chest tightness and shortness of breath. Negative for cough.        Per HPI, otherwise negative  Cardiovascular:       Per HPI, otherwise negative  Gastrointestinal: Negative for vomiting.  Endocrine:       Negative aside from  HPI  Genitourinary:       Neg aside from HPI   Musculoskeletal:       Per HPI, otherwise negative  Skin: Negative.   Neurological: Positive for headaches. Negative for syncope.  Psychiatric/Behavioral: Negative for confusion.      Allergies  Review of patient's allergies indicates no known allergies.  Home Medications   Prior to Admission medications   Medication Sig Start Date End Date Taking? Authorizing Provider  albuterol (PROAIR HFA) 108 (90 BASE) MCG/ACT inhaler Inhale 2 puffs into the lungs every 4 (four) hours as needed for wheezing or shortness of breath.   Yes Historical Provider, MD  aspirin EC 81 MG tablet Take 81 mg by mouth daily.   Yes Historical Provider, MD  benazepril-hydrochlorthiazide (LOTENSIN HCT) 20-12.5 MG per tablet Take 0.5 tablets by mouth daily.     Yes Historical Provider, MD  buPROPion (WELLBUTRIN SR) 150 MG 12 hr tablet Take 150 mg by mouth 2 (two) times daily.   Yes Historical Provider, MD  Calcium Carbonate-Vitamin D (CALCIUM 600 + D PO) Take 1 tablet by mouth daily.   Yes Historical Provider, MD  fish oil-omega-3 fatty acids 1000 MG capsule Take 1 g by mouth daily.   Yes Historical Provider, MD  gabapentin (NEURONTIN) 300 MG capsule Take 300 mg by mouth daily.    Yes Historical Provider, MD  Garlic TABS Take 1 tablet by mouth daily.   Yes Historical Provider, MD  ipratropium-albuterol (DUONEB) 0.5-2.5 (3) MG/3ML SOLN Inhale 3 mLs into the lungs every 4 (four) hours as needed (wheezing/shortness of breath).  09/05/14  Yes Historical Provider, MD  meclizine (ANTIVERT) 25 MG tablet Take 1 tablet by mouth 3 (three) times daily. 09/05/14  Yes Historical Provider, MD  omeprazole (PRILOSEC) 20 MG capsule Take 20 mg by mouth daily.   Yes Historical Provider, MD  rOPINIRole (REQUIP) 0.25 MG tablet Take 1 tablet by mouth 3 (three) times daily. 09/05/14  Yes Historical Provider, MD  TRADJENTA 5 MG TABS tablet Take 5 mg by mouth daily.  08/10/12  Yes Historical Provider, MD  acyclovir (ZOVIRAX) 400 MG tablet Take 1 tablet (400 mg total) by mouth 5 (five) times daily. Patient not taking: Reported on 01/10/2015 11/24/14   Jonnie Kind, MD  ARCAPTA NEOHALER 75 MCG CAPS  09/16/14   Historical Provider, MD  clotrimazole-betamethasone (LOTRISONE) cream Apply 1 application topically 2 (two) times daily. Apply to rash Patient not taking: Reported on 01/10/2015 09/27/14   Baird Cancer, PA-C  denosumab (PROLIA) 60 MG/ML SOLN injection Inject 60 mg into the skin every 6 (six) months. Administer in upper arm, thigh, or abdomen    Historical Provider, MD  HYDROcodone-acetaminophen (NORCO/VICODIN) 5-325 MG per tablet Take 1 tablet by mouth every 4 (four) hours as needed. Patient not taking: Reported on 01/10/2015 08/17/14   Carole Civil, MD  hydrocortisone 2.5  % ointment Apply 1 application topically 2 (two) times daily as needed (rash).  08/31/14   Historical Provider, MD  metroNIDAZOLE (FLAGYL) 500 MG tablet Take 1 tablet (500 mg total) by mouth 2 (two) times daily. Patient not taking: Reported on 01/10/2015 11/24/14   Jonnie Kind, MD  nystatin (MYCOSTATIN/NYSTOP) 100000 UNIT/GM POWD Apply to rash/ moist areas under breasts, abdominal folds and back three times a day until healed, keep areas dry. Patient not taking: Reported on 01/10/2015 09/10/14   Mariea Clonts, MD   BP 129/55 mmHg  Pulse 89  Temp(Src) 98.1 F (36.7 C) (Oral)  Resp 19  Ht 5\' 4"  (1.626 m)  Wt 169 lb (76.658 kg)  BMI 28.99 kg/m2  SpO2 94% Physical Exam  Constitutional: She is oriented to person, place, and time. She appears well-developed and well-nourished. No distress.  HENT:  Head: Normocephalic and atraumatic.  Eyes: Conjunctivae and EOM are normal.  Cardiovascular: Regular rhythm.  Tachycardia present.   Pulmonary/Chest: Effort normal and breath sounds normal. No stridor. No respiratory distress.  coarse breath sounds bilaterally  Abdominal: She exhibits no distension.  Musculoskeletal: She exhibits no edema.  Neurological: She is alert and oriented to person, place, and time. No cranial nerve deficit.  Skin: Skin is warm and dry.  Psychiatric: She has a normal mood and affect.  Nursing note and vitals reviewed.   ED Course  Procedures (including critical care time)  DIAGNOSTIC STUDIES: Oxygen Saturation is 98% on nasal canula, normal by my interpretation.    COORDINATION OF CARE: 10:09 PM Discussed treatment plan with patient at beside, the patient agrees with the plan and has no further questions at this time.   Labs Review Labs Reviewed  COMPREHENSIVE METABOLIC PANEL - Abnormal; Notable for the following:    Glucose, Bld 166 (*)    Creatinine, Ser 1.37 (*)    Albumin 3.4 (*)    Total Bilirubin 0.2 (*)    GFR calc non Af Amer 36 (*)    GFR calc Af  Amer 42 (*)    Anion gap 3 (*)    All other components within normal limits  CBC WITH DIFFERENTIAL/PLATELET  LIPASE, BLOOD  TROPONIN I  BRAIN NATRIURETIC PEPTIDE    Imaging Review Dg Chest Portable 1 View  01/10/2015   CLINICAL DATA:  Shortness of breath for 2-3 days, increasing in severity tonight. History of left lung cancer. Left lung surgery 2009.  EXAM: PORTABLE CHEST - 1 VIEW  COMPARISON:  Radiograph 04/03/2014, CT 09/14/2014  FINDINGS: There is volume loss on the left hemithorax with postsurgical change. Underlying chronic lung disease with interstitial prominence and hyperinflation of the right lung. Cardiomediastinal contours are unchanged. No acute airspace disease or findings of pneumonia. There is no large pleural effusion. No pneumothorax. No acute osseous abnormalities are seen.  IMPRESSION: Chronic lung disease and postsurgical change in the left hemithorax. No acute pulmonary process.   Electronically Signed   By: Jeb Levering M.D.   On: 01/10/2015 22:12     EKG Interpretation   Date/Time:  Wednesday January 10 2015 21:41:40 EST Ventricular Rate:  97 PR Interval:  135 QRS Duration: 80 QT Interval:  358 QTC Calculation: 455 R Axis:   38 Text Interpretation:  Sinus rhythm Anteroseptal infarct, age indeterminate  Sinus rhythm Artifact T wave abnormality - more inverted in V2 than prior  Abnormal ekg Confirmed by Carmin Muskrat  MD [phone]) on 01/10/2015 10:01:33  PM     12:10 AM Patient calm, no respiratory distress, no new complaints. She has improved substantially since initial dilators here. MDM  This patient with COPD presents with ongoing dyspnea.  Here the patient is initially tachypneic, borderline hypoxic, tachycardic.  Patient presents substantially with fluids, broken dilators.  No evidence for pneumonia, ongoing coronary ischemia, other new acute pathology. Patient discharged in stable condition to follow-up with primary care/pulmonology.  I personally  performed the services described in this documentation, which was scribed in my presence. The recorded information has been reviewed and is accurate.     Carmin Muskrat, MD 01/11/15 [phone]

## 2015-01-10 NOTE — ED Notes (Signed)
Pt c/o sob x 2-3 days.

## 2015-01-11 MED ORDER — PREDNISONE 20 MG PO TABS: 60.0000 mg | ORAL_TABLET | Freq: Every day | ORAL | Status: AC

## 2015-01-11 NOTE — Discharge Instructions (Signed)
As discussed, it is important that you follow up as soon as possible with your physician for continued management of your condition.  For the next 3 days please use your albuterol inhaler every 4 hours.  You may then tailor its use as needed.   If you develop any new, or concerning changes in your condition, please return to the emergency department immediately.   COPD ACTION PLAN Actions to Take if My Symptoms Get Worse 24-Hour Nurse Call Line: 1 [phone]  Green Zone: I am doing well today  Symptoms: Actions:  Usual activity and exercise level Usual amounts of cough and phlegm/mucus Sleep well at night Appetite is good Continue to take daily maintenance medicines (the ones you take no matter what until your doctor adjusts them for you) Use oxygen as prescribed Continue regular exercise/diet plan At all times avoid cigarette smoke, inhaled irritants  Yellow Zone: I am having a bad day or a COPD flare  Symptoms Actions  More breathless than usual I have less energy for my daily activities Increased or thicker phlegm/mucus Change in color of phlegm/mucus Using quick relief inhaler/nebulizer more often More coughing than usual I feel like I have a chest cold Poor sleep and my symptoms woke me up My appetite is not good My medicine is not helping Continue daily medications Use quick relief inhaler or nebulizer every 4 hours Use oxygen as prescribed Get plenty of rest Use pursed lip breathing At all times avoid cigarette smoke, inhaled irritants Call provider for a same day or next day appointment  if symptoms are not improving within 48 hours of onset or  if symptoms are not improving after quick relief inhaler or nebulizer  Red Zone: I need urgent medical care  Symptoms Actions  Severe shortness of breath even at rest Severe shortness of breath even after quick relief inhaler or nebulizer Not able to do any activity because of breathing Fever or shaking  chills Feeling confused or very drowsy Chest pains Coughing up blood Quick relief inhaler or nebulizer not effective Call 911 or have someone take you to the nearest emergency room Call provider for  now or same day appointment  3 - 2 - 1 Plan: If any of the 3 main COPD symptoms (Cough, Shortness of Breath, or Mucus Production) change for 2 days or more, your number 1 priority is to call your doctor.

## 2015-01-25 DIAGNOSIS — J449 Chronic obstructive pulmonary disease, unspecified: Secondary | ICD-10-CM | POA: Diagnosis not present

## 2015-02-01 ENCOUNTER — Other Ambulatory Visit (HOSPITAL_COMMUNITY): Payer: Self-pay | Admitting: Internal Medicine

## 2015-02-01 DIAGNOSIS — R269 Unspecified abnormalities of gait and mobility: Secondary | ICD-10-CM

## 2015-02-01 DIAGNOSIS — J449 Chronic obstructive pulmonary disease, unspecified: Secondary | ICD-10-CM | POA: Diagnosis not present

## 2015-02-01 DIAGNOSIS — I10 Essential (primary) hypertension: Secondary | ICD-10-CM | POA: Diagnosis not present

## 2015-02-01 DIAGNOSIS — E119 Type 2 diabetes mellitus without complications: Secondary | ICD-10-CM | POA: Diagnosis not present

## 2015-02-01 DIAGNOSIS — R2681 Unsteadiness on feet: Secondary | ICD-10-CM | POA: Diagnosis not present

## 2015-02-05 ENCOUNTER — Ambulatory Visit (HOSPITAL_COMMUNITY)
Admission: RE | Admit: 2015-02-05 | Discharge: 2015-02-05 | Disposition: A | Discharge status: Home / Self Care | Payer: Medicare Other | Source: Ambulatory Visit | Attending: Internal Medicine | Admitting: Internal Medicine

## 2015-02-05 DIAGNOSIS — R42 Dizziness and giddiness: Secondary | ICD-10-CM | POA: Insufficient documentation

## 2015-02-05 DIAGNOSIS — R269 Unspecified abnormalities of gait and mobility: Secondary | ICD-10-CM | POA: Diagnosis not present

## 2015-02-05 DIAGNOSIS — G319 Degenerative disease of nervous system, unspecified: Secondary | ICD-10-CM | POA: Insufficient documentation

## 2015-02-05 DIAGNOSIS — E119 Type 2 diabetes mellitus without complications: Secondary | ICD-10-CM | POA: Insufficient documentation

## 2015-02-05 DIAGNOSIS — Z85118 Personal history of other malignant neoplasm of bronchus and lung: Secondary | ICD-10-CM | POA: Insufficient documentation

## 2015-02-05 DIAGNOSIS — I10 Essential (primary) hypertension: Secondary | ICD-10-CM | POA: Diagnosis not present

## 2015-02-07 ENCOUNTER — Ambulatory Visit (HOSPITAL_COMMUNITY): Payer: Medicare Other

## 2015-02-12 DIAGNOSIS — I10 Essential (primary) hypertension: Secondary | ICD-10-CM | POA: Diagnosis not present

## 2015-02-12 DIAGNOSIS — E119 Type 2 diabetes mellitus without complications: Secondary | ICD-10-CM | POA: Diagnosis not present

## 2015-02-12 DIAGNOSIS — C3490 Malignant neoplasm of unspecified part of unspecified bronchus or lung: Secondary | ICD-10-CM | POA: Diagnosis not present

## 2015-02-12 DIAGNOSIS — J449 Chronic obstructive pulmonary disease, unspecified: Secondary | ICD-10-CM | POA: Diagnosis not present

## 2015-02-23 DIAGNOSIS — J449 Chronic obstructive pulmonary disease, unspecified: Secondary | ICD-10-CM | POA: Diagnosis not present

## 2015-02-27 DIAGNOSIS — H903 Sensorineural hearing loss, bilateral: Secondary | ICD-10-CM | POA: Diagnosis not present

## 2015-02-27 DIAGNOSIS — Z01118 Encounter for examination of ears and hearing with other abnormal findings: Secondary | ICD-10-CM | POA: Diagnosis not present

## 2015-03-26 DIAGNOSIS — J449 Chronic obstructive pulmonary disease, unspecified: Secondary | ICD-10-CM | POA: Diagnosis not present

## 2015-04-06 ENCOUNTER — Other Ambulatory Visit (HOSPITAL_COMMUNITY): Payer: Self-pay | Admitting: Internal Medicine

## 2015-04-06 DIAGNOSIS — Z1231 Encounter for screening mammogram for malignant neoplasm of breast: Secondary | ICD-10-CM

## 2015-04-16 ENCOUNTER — Encounter (HOSPITAL_COMMUNITY): Payer: Self-pay | Admitting: *Deleted

## 2015-04-16 ENCOUNTER — Inpatient Hospital Stay (HOSPITAL_COMMUNITY)
Admission: EM | Admit: 2015-04-16 | Discharge: 2015-04-20 | DRG: 281 | Disposition: A | Discharge status: Home / Self Care | Payer: Medicare Other | Attending: Cardiology | Admitting: Cardiology

## 2015-04-16 ENCOUNTER — Emergency Department (HOSPITAL_COMMUNITY): Payer: Medicare Other

## 2015-04-16 DIAGNOSIS — I129 Hypertensive chronic kidney disease with stage 1 through stage 4 chronic kidney disease, or unspecified chronic kidney disease: Secondary | ICD-10-CM | POA: Diagnosis present

## 2015-04-16 DIAGNOSIS — Z90710 Acquired absence of both cervix and uterus: Secondary | ICD-10-CM | POA: Diagnosis not present

## 2015-04-16 DIAGNOSIS — N183 Chronic kidney disease, stage 3 unspecified: Secondary | ICD-10-CM | POA: Diagnosis present

## 2015-04-16 DIAGNOSIS — I498 Other specified cardiac arrhythmias: Secondary | ICD-10-CM

## 2015-04-16 DIAGNOSIS — E785 Hyperlipidemia, unspecified: Secondary | ICD-10-CM | POA: Diagnosis not present

## 2015-04-16 DIAGNOSIS — I10 Essential (primary) hypertension: Secondary | ICD-10-CM | POA: Diagnosis not present

## 2015-04-16 DIAGNOSIS — Z87891 Personal history of nicotine dependence: Secondary | ICD-10-CM | POA: Diagnosis not present

## 2015-04-16 DIAGNOSIS — N289 Disorder of kidney and ureter, unspecified: Secondary | ICD-10-CM

## 2015-04-16 DIAGNOSIS — R778 Other specified abnormalities of plasma proteins: Secondary | ICD-10-CM

## 2015-04-16 DIAGNOSIS — Z9049 Acquired absence of other specified parts of digestive tract: Secondary | ICD-10-CM | POA: Diagnosis present

## 2015-04-16 DIAGNOSIS — R509 Fever, unspecified: Secondary | ICD-10-CM

## 2015-04-16 DIAGNOSIS — M810 Age-related osteoporosis without current pathological fracture: Secondary | ICD-10-CM | POA: Diagnosis present

## 2015-04-16 DIAGNOSIS — Z85118 Personal history of other malignant neoplasm of bronchus and lung: Secondary | ICD-10-CM | POA: Diagnosis not present

## 2015-04-16 DIAGNOSIS — E119 Type 2 diabetes mellitus without complications: Secondary | ICD-10-CM | POA: Diagnosis present

## 2015-04-16 DIAGNOSIS — I5181 Takotsubo syndrome: Secondary | ICD-10-CM | POA: Diagnosis not present

## 2015-04-16 DIAGNOSIS — I2510 Atherosclerotic heart disease of native coronary artery without angina pectoris: Secondary | ICD-10-CM | POA: Diagnosis present

## 2015-04-16 DIAGNOSIS — R9431 Abnormal electrocardiogram [ECG] [EKG]: Secondary | ICD-10-CM | POA: Diagnosis not present

## 2015-04-16 DIAGNOSIS — J441 Chronic obstructive pulmonary disease with (acute) exacerbation: Secondary | ICD-10-CM | POA: Diagnosis not present

## 2015-04-16 DIAGNOSIS — R7989 Other specified abnormal findings of blood chemistry: Secondary | ICD-10-CM | POA: Diagnosis not present

## 2015-04-16 DIAGNOSIS — R0602 Shortness of breath: Secondary | ICD-10-CM | POA: Diagnosis not present

## 2015-04-16 DIAGNOSIS — R748 Abnormal levels of other serum enzymes: Secondary | ICD-10-CM | POA: Diagnosis not present

## 2015-04-16 DIAGNOSIS — Z7901 Long term (current) use of anticoagulants: Secondary | ICD-10-CM | POA: Diagnosis not present

## 2015-04-16 DIAGNOSIS — M1990 Unspecified osteoarthritis, unspecified site: Secondary | ICD-10-CM | POA: Diagnosis not present

## 2015-04-16 DIAGNOSIS — Z7982 Long term (current) use of aspirin: Secondary | ICD-10-CM | POA: Diagnosis not present

## 2015-04-16 DIAGNOSIS — I214 Non-ST elevation (NSTEMI) myocardial infarction: Secondary | ICD-10-CM | POA: Diagnosis not present

## 2015-04-16 DIAGNOSIS — B349 Viral infection, unspecified: Secondary | ICD-10-CM | POA: Diagnosis present

## 2015-04-16 DIAGNOSIS — I499 Cardiac arrhythmia, unspecified: Secondary | ICD-10-CM

## 2015-04-16 HISTORY — DX: Type 2 diabetes mellitus without complications: E11.9

## 2015-04-16 HISTORY — DX: Cardiac arrhythmia, unspecified: I49.9

## 2015-04-16 HISTORY — DX: Atherosclerotic heart disease of native coronary artery without angina pectoris: I25.10

## 2015-04-16 HISTORY — DX: Other specified cardiac arrhythmias: I49.8

## 2015-04-16 HISTORY — DX: Essential (primary) hypertension: I10

## 2015-04-16 HISTORY — DX: Takotsubo syndrome: I51.81

## 2015-04-16 HISTORY — DX: Hyperlipidemia, unspecified: E78.5

## 2015-04-16 LAB — CBC WITH DIFFERENTIAL/PLATELET
Basophils Absolute: 0 10*3/uL (ref 0.0–0.1)
Basophils Relative: 0 % (ref 0–1)
Eosinophils Absolute: 0.2 10*3/uL (ref 0.0–0.7)
Eosinophils Relative: 2 % (ref 0–5)
HCT: 40 % (ref 36.0–46.0)
Hemoglobin: 12.4 g/dL (ref 12.0–15.0)
Lymphocytes Relative: 12 % (ref 12–46)
Lymphs Abs: 1.1 10*3/uL (ref 0.7–4.0)
MCH: 28.9 pg (ref 26.0–34.0)
MCHC: 31 g/dL (ref 30.0–36.0)
MCV: 93.2 fL (ref 78.0–100.0)
Monocytes Absolute: 0.4 10*3/uL (ref 0.1–1.0)
Monocytes Relative: 5 % (ref 3–12)
Neutro Abs: 7.2 10*3/uL (ref 1.7–7.7)
Neutrophils Relative %: 81 % — ABNORMAL HIGH (ref 43–77)
Platelets: 186 10*3/uL (ref 150–400)
RBC: 4.29 MIL/uL (ref 3.87–5.11)
RDW: 15.1 % (ref 11.5–15.5)
WBC: 8.9 10*3/uL (ref 4.0–10.5)

## 2015-04-16 LAB — COMPREHENSIVE METABOLIC PANEL
ALT: 20 U/L (ref 14–54)
AST: 25 U/L (ref 15–41)
Albumin: 3.6 g/dL (ref 3.5–5.0)
Alkaline Phosphatase: 49 U/L (ref 38–126)
Anion gap: 7 (ref 5–15)
BUN: 14 mg/dL (ref 6–20)
CO2: 29 mmol/L (ref 22–32)
Calcium: 9.1 mg/dL (ref 8.9–10.3)
Chloride: 103 mmol/L (ref 101–111)
Creatinine, Ser: 1.36 mg/dL — ABNORMAL HIGH (ref 0.44–1.00)
GFR calc Af Amer: 42 mL/min — ABNORMAL LOW (ref >60)
GFR calc non Af Amer: 36 mL/min — ABNORMAL LOW (ref >60)
Glucose, Bld: 190 mg/dL — ABNORMAL HIGH (ref 70–99)
Potassium: 4.1 mmol/L (ref 3.5–5.1)
Sodium: 139 mmol/L (ref 135–145)
Total Bilirubin: 0.3 mg/dL (ref 0.3–1.2)
Total Protein: 6.9 g/dL (ref 6.5–8.1)

## 2015-04-16 LAB — TROPONIN I
Troponin I: 0.23 ng/mL — ABNORMAL HIGH (ref <0.031)
Troponin I: 0.91 ng/mL (ref <0.031)
Troponin I: 1.68 ng/mL (ref <0.031)
Troponin I: 2.61 ng/mL (ref <0.031)

## 2015-04-16 LAB — MRSA PCR SCREENING: MRSA by PCR: NEGATIVE

## 2015-04-16 LAB — HEPARIN LEVEL (UNFRACTIONATED): Heparin Unfractionated: 0.19 IU/mL — ABNORMAL LOW (ref 0.30–0.70)

## 2015-04-16 LAB — BRAIN NATRIURETIC PEPTIDE: B Natriuretic Peptide: 76 pg/mL (ref 0.0–100.0)

## 2015-04-16 LAB — MAGNESIUM: Magnesium: 1.7 mg/dL (ref 1.7–2.4)

## 2015-04-16 MED ORDER — ALBUTEROL SULFATE (2.5 MG/3ML) 0.083% IN NEBU
5.0000 mg | INHALATION_SOLUTION | Freq: Once | RESPIRATORY_TRACT | Status: AC
  Administered 2015-04-16: 5 mg via RESPIRATORY_TRACT
  Filled 2015-04-16: qty 6

## 2015-04-16 MED ORDER — BUDESONIDE-FORMOTEROL FUMARATE 160-4.5 MCG/ACT IN AERO
2.0000 | INHALATION_SPRAY | Freq: Two times a day (BID) | RESPIRATORY_TRACT | Status: DC
  Administered 2015-04-16 – 2015-04-20 (×8): 2 via RESPIRATORY_TRACT
  Filled 2015-04-16: qty 6

## 2015-04-16 MED ORDER — IPRATROPIUM BROMIDE 0.02 % IN SOLN
0.5000 mg | Freq: Once | RESPIRATORY_TRACT | Status: AC
  Administered 2015-04-16: 0.5 mg via RESPIRATORY_TRACT
  Filled 2015-04-16: qty 2.5

## 2015-04-16 MED ORDER — HEPARIN (PORCINE) IN NACL 100-0.45 UNIT/ML-% IJ SOLN
900.0000 [IU]/h | INTRAMUSCULAR | Status: DC
  Administered 2015-04-16: 900 [IU]/h via INTRAVENOUS
  Filled 2015-04-16 (×2): qty 250

## 2015-04-16 MED ORDER — ALBUTEROL SULFATE (2.5 MG/3ML) 0.083% IN NEBU: 5.0000 mg | INHALATION_SOLUTION | RESPIRATORY_TRACT | Status: DC | PRN

## 2015-04-16 MED ORDER — DOXYCYCLINE HYCLATE 100 MG PO TABS
100.0000 mg | ORAL_TABLET | Freq: Two times a day (BID) | ORAL | Status: DC
  Administered 2015-04-16 – 2015-04-20 (×9): 100 mg via ORAL
  Filled 2015-04-16 (×11): qty 1

## 2015-04-16 MED ORDER — HEPARIN BOLUS VIA INFUSION
4000.0000 [IU] | Freq: Once | INTRAVENOUS | Status: AC
  Administered 2015-04-16: 4000 [IU] via INTRAVENOUS

## 2015-04-16 MED ORDER — HEPARIN (PORCINE) IN NACL 100-0.45 UNIT/ML-% IJ SOLN
1050.0000 [IU]/h | INTRAMUSCULAR | Status: DC
  Filled 2015-04-16 (×3): qty 250

## 2015-04-16 MED ORDER — ASPIRIN 81 MG PO CHEW
324.0000 mg | CHEWABLE_TABLET | Freq: Once | ORAL | Status: AC
  Administered 2015-04-16: 324 mg via ORAL
  Filled 2015-04-16: qty 4

## 2015-04-16 MED ORDER — HEPARIN BOLUS VIA INFUSION
1000.0000 [IU] | Freq: Once | INTRAVENOUS | Status: AC
  Administered 2015-04-16: 1000 [IU] via INTRAVENOUS
  Filled 2015-04-16: qty 1000

## 2015-04-16 MED ORDER — CETYLPYRIDINIUM CHLORIDE 0.05 % MT LIQD
7.0000 mL | Freq: Two times a day (BID) | OROMUCOSAL | Status: DC
  Administered 2015-04-16 – 2015-04-20 (×8): 7 mL via OROMUCOSAL

## 2015-04-16 NOTE — Progress Notes (Signed)
ANTICOAGULATION CONSULT NOTE - Initial Consult  Pharmacy Consult for Heparin Indication: chest pain/ACS  No Known Allergies  Patient Measurements: Height: '5\' 4"'$  (162.6 cm) Weight: 179 lb (81.194 kg) IBW/kg (Calculated) : 54.7 HEPARIN DW (KG): 72.2   Vital Signs: Temp: 97.7 F (36.5 C) (05/09 0428) Temp Source: Oral (05/09 0428) BP: 120/57 mmHg (05/09 0732) Pulse Rate: 97 (05/09 0732)  Labs:  Recent Labs  04/16/15 0525  HGB 12.4  HCT 40.0  PLT 186  CREATININE 1.36*  TROPONINI 0.23*    Estimated Creatinine Clearance: 34.6 mL/min (by C-G formula based on Cr of 1.36).   Medical History: Past Medical History  Diagnosis Date  . Cancer     left lung/ 2009/ surg only  . Hypertension   . Arthritis   . Diverticulitis   . Diabetes mellitus   . H/O ventral hernia   . Lung cancer   . COPD (chronic obstructive pulmonary disease)   . Osteoporosis   . Adenocarcinoma of lung 08/20/2012    Medications:   (Not in a hospital admission)  Assessment: 81 yo F who presents with shortness of breath, wheezing.  Noted to have elevated troponin, ST elevation on EKG.   Plan transfer to [facility] for cardiology work-up.  Asked to initiate IV heparin.   CBC reviewed.  No bleeding noted.   Goal of Therapy:  Heparin level 0.3-0.7 units/ml Monitor platelets by anticoagulation protocol: Yes   Plan:  Give 4000 units bolus x 1 Start heparin infusion at 900 units/hr Check anti-Xa level in 8 hours and daily while on heparin Continue to monitor H&H and platelets  Jas Betten, Lavonia Drafts 04/16/2015,7:40 AM

## 2015-04-16 NOTE — ED Provider Notes (Signed)
CSN: [id number]     Arrival date & time 04/16/15  3154 History   First MD Initiated Contact with Patient 04/16/15 [phone]     Chief Complaint  Patient presents with  . Shortness of Breath     (Consider location/radiation/quality/duration/timing/severity/associated sxs/prior Treatment) HPI  Patient states she started having increasing wheezing 2 days ago. She was having difficulty coughing up sputum but states tonight she was able to cough it up and it was green. She denies any fever, chills, or chest pain. She states the sides of her ribs are sore when she coughs. She has had nausea without vomiting or diarrhea. She denies any central chest pain or pressure. She states when she uses her nebulizer at home it helps but only briefly. She states her chest has been tight but it's a feeling that she can't breathe deep like she has had before with her COPD. She states EMS gave her a nebulizer and IV Solu-Medrol and she is improved compared to when she was at home. She states she's never had pneumonia before but thinks she may have pneumonia now.  PCP Dr Legrand Rams Pulmonary Dr Luan Pulling  Past Medical History  Diagnosis Date  . Cancer     left lung/ 2009/ surg only  . Hypertension   . Arthritis   . Diverticulitis   . Diabetes mellitus   . H/O ventral hernia   . Lung cancer   . COPD (chronic obstructive pulmonary disease)   . Osteoporosis   . Adenocarcinoma of lung 08/20/2012   Past Surgical History  Procedure Laterality Date  . Cholecystectomy    . Ectopic pregnancy surgery    . Abdominal hysterectomy    . Lung cancer surgery    . Incisional hernia repair N/A 08/26/2013    Procedure: HERNIA REPAIR INCISIONAL WITH MESH;  Surgeon: Jamesetta So, MD;  Location: [facility];  Service: General;  Laterality: N/A;  . Colonoscopy N/A 09/18/2014    Procedure: COLONOSCOPY;  Surgeon: Danie Binder, MD;  Location: [facility];  Service: Endoscopy;  Laterality: N/A;  8:30 AM - moved to 10:30 Rosendo Gros to notify  pt   Family History  Problem Relation Age of Onset  . Diabetes Mother   . Hypertension Mother   . Diabetes Father   . Asthma    . Cancer    . Colon cancer Neg Hx    History  Substance Use Topics  . Smoking status: Former Research scientist (life sciences)  . Smokeless tobacco: Never Used  . Alcohol Use: No   Daughter lives with her Pt is on oxygen 2 lpm [place]  OB History    No data available     Review of Systems  All other systems reviewed and are negative.     Allergies  Review of patient's allergies indicates no known allergies.  Home Medications   Prior to Admission medications   Medication Sig Start Date End Date Taking? Authorizing Provider  albuterol (PROAIR HFA) 108 (90 BASE) MCG/ACT inhaler Inhale 2 puffs into the lungs every 4 (four) hours as needed for wheezing or shortness of breath.   Yes Historical Provider, MD  aspirin EC 81 MG tablet Take 81 mg by mouth daily.   Yes Historical Provider, MD  benazepril-hydrochlorthiazide (LOTENSIN HCT) 20-12.5 MG per tablet Take 0.5 tablets by mouth daily.    Yes Historical Provider, MD  buPROPion (WELLBUTRIN SR) 150 MG 12 hr tablet Take 150 mg by mouth 2 (two) times daily.   Yes Historical Provider, MD  Calcium Carbonate-Vitamin D (CALCIUM 600 + D PO) Take 1 tablet by mouth daily.   Yes Historical Provider, MD  denosumab (PROLIA) 60 MG/ML SOLN injection Inject 60 mg into the skin every 6 (six) months. Administer in upper arm, thigh, or abdomen   Yes Historical Provider, MD  fish oil-omega-3 fatty acids 1000 MG capsule Take 1 g by mouth daily.   Yes Historical Provider, MD  gabapentin (NEURONTIN) 300 MG capsule Take 300 mg by mouth daily.    Yes Historical Provider, MD  Garlic TABS Take 1 tablet by mouth daily.   Yes Historical Provider, MD  hydrocortisone 2.5 % ointment Apply 1 application topically 2 (two) times daily as needed (rash).  08/31/14  Yes Historical Provider, MD  ipratropium-albuterol (DUONEB) 0.5-2.5 (3) MG/3ML SOLN Inhale 3 mLs into the  lungs every 4 (four) hours as needed (wheezing/shortness of breath).  09/05/14  Yes Historical Provider, MD  meclizine (ANTIVERT) 25 MG tablet Take 1 tablet by mouth 3 (three) times daily. 09/05/14  Yes Historical Provider, MD  omeprazole (PRILOSEC) 20 MG capsule Take 20 mg by mouth daily.   Yes Historical Provider, MD  rOPINIRole (REQUIP) 0.25 MG tablet Take 1 tablet by mouth 3 (three) times daily. 09/05/14  Yes Historical Provider, MD  TRADJENTA 5 MG TABS tablet Take 5 mg by mouth daily.  08/10/12  Yes Historical Provider, MD  acyclovir (ZOVIRAX) 400 MG tablet Take 1 tablet (400 mg total) by mouth 5 (five) times daily. Patient not taking: Reported on 01/10/2015 11/24/14   Jonnie Kind, MD  clotrimazole-betamethasone (LOTRISONE) cream Apply 1 application topically 2 (two) times daily. Apply to rash Patient not taking: Reported on 01/10/2015 09/27/14   Baird Cancer, PA-C  HYDROcodone-acetaminophen (NORCO/VICODIN) 5-325 MG per tablet Take 1 tablet by mouth every 4 (four) hours as needed. Patient not taking: Reported on 01/10/2015 08/17/14   Carole Civil, MD  metroNIDAZOLE (FLAGYL) 500 MG tablet Take 1 tablet (500 mg total) by mouth 2 (two) times daily. Patient not taking: Reported on 01/10/2015 11/24/14   Jonnie Kind, MD  nystatin (MYCOSTATIN/NYSTOP) 100000 UNIT/GM POWD Apply to rash/ moist areas under breasts, abdominal folds and back three times a day until healed, keep areas dry. Patient not taking: Reported on 01/10/2015 09/10/14   Elnora Morrison, MD   BP 125/59 mmHg  Pulse 99  Temp(Src) 97.7 F (36.5 C) (Oral)  Resp 20  Ht '5\' 4"'$  (1.626 m)  Wt 179 lb (81.194 kg)  BMI 30.71 kg/m2  SpO2 99%  Vital signs normal   Physical Exam  Constitutional: She is oriented to person, place, and time. She appears well-developed and well-nourished.  Non-toxic appearance. She does not appear ill. No distress.  HENT:  Head: Normocephalic and atraumatic.  Right Ear: External ear normal.  Left Ear:  External ear normal.  Nose: Nose normal. No mucosal edema or rhinorrhea.  Mouth/Throat: Oropharynx is clear and moist and mucous membranes are normal. No dental abscesses or uvula swelling.  Eyes: Conjunctivae and EOM are normal. Pupils are equal, round, and reactive to light.  Neck: Normal range of motion and full passive range of motion without pain. Neck supple.  Cardiovascular: Normal rate, regular rhythm and normal heart sounds.  Exam reveals no gallop and no friction rub.   No murmur heard. Pulmonary/Chest: Effort normal. No respiratory distress. She has wheezes. She has no rhonchi. She has no rales. She exhibits no tenderness and no crepitus.  Abdominal: Soft. Normal appearance and bowel sounds are  normal. She exhibits no distension. There is no tenderness. There is no rebound and no guarding.  Musculoskeletal: Normal range of motion. She exhibits no edema or tenderness.  Moves all extremities well.   Neurological: She is alert and oriented to person, place, and time. She has normal strength. No cranial nerve deficit.  Skin: Skin is warm, dry and intact. No rash noted. No erythema. No pallor.  Psychiatric: She has a normal mood and affect. Her speech is normal and behavior is normal. Her mood appears not anxious.  Nursing note and vitals reviewed.   ED Course  Procedures (including critical care time) Medications  aspirin chewable tablet 324 mg (not administered)  albuterol (PROVENTIL) (2.5 MG/3ML) 0.083% nebulizer solution 5 mg (5 mg Nebulization Given 04/16/15 0454)  ipratropium (ATROVENT) nebulizer solution 0.5 mg (0.5 mg Nebulization Given 04/16/15 0455)  albuterol (PROVENTIL) (2.5 MG/3ML) 0.083% nebulizer solution 5 mg (5 mg Nebulization Given 04/16/15 0625)  ipratropium (ATROVENT) nebulizer solution 0.5 mg (0.5 mg Nebulization Given 04/16/15 0625)    04:52 Dr Ulyses Amor, cardiology fellow has reviewed her EKG and agrees STEMI should not be called, questions pericarditis or electrolyte  abnormality.   Recheck 06:00 feeling better, feels like she could go home. Still has some end expiratory wheezing, second nebulizer ordered.   06:35 Dr Ron Parker, cardiology, has reviewed her EKG's. Feels she should come to [facility] for admission to CCU or step down. ASA 324 mg, IV heparin  6:45 AM patient was informed of need to be admitted. She denies chest pain at this time. She states the chest tightness she had was like what she's had in the past her COPD exacerbation.  Labs Review Results for orders placed or performed during the hospital encounter of 04/16/15  Troponin I  Result Value Ref Range   Troponin I 0.23 (H) <0.031 ng/mL  CBC with Differential  Result Value Ref Range   WBC 8.9 4.0 - 10.5 K/uL   RBC 4.29 3.87 - 5.11 MIL/uL   Hemoglobin 12.4 12.0 - 15.0 g/dL   HCT 40.0 36.0 - 46.0 %   MCV 93.2 78.0 - 100.0 fL   MCH 28.9 26.0 - 34.0 pg   MCHC 31.0 30.0 - 36.0 g/dL   RDW 15.1 11.5 - 15.5 %   Platelets 186 150 - 400 K/uL   Neutrophils Relative % 81 (H) 43 - 77 %   Neutro Abs 7.2 1.7 - 7.7 K/uL   Lymphocytes Relative 12 12 - 46 %   Lymphs Abs 1.1 0.7 - 4.0 K/uL   Monocytes Relative 5 3 - 12 %   Monocytes Absolute 0.4 0.1 - 1.0 K/uL   Eosinophils Relative 2 0 - 5 %   Eosinophils Absolute 0.2 0.0 - 0.7 K/uL   Basophils Relative 0 0 - 1 %   Basophils Absolute 0.0 0.0 - 0.1 K/uL  Brain natriuretic peptide  Result Value Ref Range   B Natriuretic Peptide 76.0 0.0 - 100.0 pg/mL  Comprehensive metabolic panel  Result Value Ref Range   Sodium 139 135 - 145 mmol/L   Potassium 4.1 3.5 - 5.1 mmol/L   Chloride 103 101 - 111 mmol/L   CO2 29 22 - 32 mmol/L   Glucose, Bld 190 (H) 70 - 99 mg/dL   BUN 14 6 - 20 mg/dL   Creatinine, Ser 1.36 (H) 0.44 - 1.00 mg/dL   Calcium 9.1 8.9 - 10.3 mg/dL   Total Protein 6.9 6.5 - 8.1 g/dL   Albumin 3.6 3.5 - 5.0  g/dL   AST 25 15 - 41 U/L   ALT 20 14 - 54 U/L   Alkaline Phosphatase 49 38 - 126 U/L   Total Bilirubin 0.3 0.3 - 1.2 mg/dL   GFR calc  non Af Amer 36 (L) >60 mL/min   GFR calc Af Amer 42 (L) >60 mL/min   Anion gap 7 5 - 15  Magnesium  Result Value Ref Range   Magnesium 1.7 1.7 - 2.4 mg/dL   Laboratory interpretation all normal except + troponin that is new (was normal in February) hyperglycemia, stable renal insufficiency     Imaging Review Dg Chest Port 1 View  04/16/2015   CLINICAL DATA:  Sudden onset shortness of breath. History of left lung cancer 2009.  EXAM: PORTABLE CHEST - 1 VIEW  COMPARISON:  01/10/2015  FINDINGS: Normal heart size and pulmonary vascularity. Volume loss on the left consistent with partial pneumonectomy. Rib changes consistent with thoracotomy. No focal airspace disease or consolidation in the lungs. No blunting of costophrenic angles. No pneumothorax. Calcification of aorta. No change since prior study.  IMPRESSION: Postoperative changes in the left lung. No evidence of active pulmonary disease.   Electronically Signed   By: Lucienne Capers M.D.   On: 04/16/2015 05:39     EKG Interpretation  #1 Date/Time:  Monday Apr 16 2015 04:22:29 EDT Ventricular Rate:  95 PR Interval:  148 QRS Duration: 75 QT Interval:  379 QTC Calculation: 476 R Axis:   41 Text Interpretation:  Sinus rhythm Anterolateral infarct, acute (LAD)  Since last tracing 10 Jan 2015 ST elevation now present in Septal leads  Confirmed by Jerin Franzel  MD-I, Finnbar Cedillos ([id number]) on 04/16/2015 4:33:51 AM    #2  EKG Interpretation  Date/Time:  Monday Apr 16 2015 06:25:44 EDT Ventricular Rate:  96 PR Interval:  148 QRS Duration: 77 QT Interval:  377 QTC Calculation: 476 R Axis:   19 Text Interpretation:  Sinus rhythm Lateral infarct, acute (LAD) Borderline ST elevation, anterior leads No significant change since last tracing EARLIER SAME DATE Confirmed by Olivia Royse  MD-I, Billie Trager ([id number]) on 04/16/2015 6:30:16 AM       MDM   Final diagnoses:  Abnormal EKG  Troponin level elevated  COPD exacerbation  Renal insufficiency    Plan transfer to  [facility] for admission  Rolland Porter, MD, FACEP   CRITICAL CARE Performed by: Rolland Porter L Total critical care time: 38 min Critical care time was exclusive of separately billable procedures and treating other patients. Critical care was necessary to treat or prevent imminent or life-threatening deterioration. Critical care was time spent personally by me on the following activities: development of treatment plan with patient and/or surrogate as well as nursing, discussions with consultants, evaluation of patient's response to treatment, examination of patient, obtaining history from patient or surrogate, ordering and performing treatments and interventions, ordering and review of laboratory studies, ordering and review of radiographic studies, pulse oximetry and re-evaluation of patient's condition.    Rolland Porter, MD 04/16/15 [phone]

## 2015-04-16 NOTE — H&P (Signed)
Cardiology Service- [facility].   Date: 04/16/2015               Patient Name:  Angela Lawson MRN: [id number]  DOB: Dec 27, 1933 Age / Sex: 81 y.o., female   PCP: Rosita Fire, MD    Primary cardiologist         we will need to arrange for cardiology follow-up by our cardiology team in the [place] office .     Requesting Physician: EDP    Consulting Reason:  SOB with EKG changes      Chief Complaint: SOB  History of Present Illness: 78 Y O F with PMH of HTN, DM, COPD, CKD3, lung Cancer, presented today with complaints of SOB of one week duration, present initially with exertion, and then became worse yesterday and present at rest, no chest pain with deep breathing. There was associated cough- productive of greenish sputum and wheezing. Pt says the cough got worse after she cut her grass on Friday- 04/13/2015. She denies any chest pain even with exertion or previously in the past but says her chest got a bit tight but this is all typical of her normal COPD exacerbation. She is on 2L of O2 at home which she uses as needed. Breathing got better after she used her inahlers for a while.  She denies palpitations, diaphoresis, or nausea or vomiting. She denies family history of premature heart disease, but says one of her sisters died of a heart attack suddenly when she was 63, was previously undiagnosed with CAD and hd no prior chest pain and another sister with chest pain that has been attributed to anxiety. She takes a daily baby aspirin, but is not on a statin. She does not know what her HgbA1c is. She is a previous smoker- quit about 2007, smoked since she was a teenager, about 5 cigs a day. She denies fever or chills, sick contacts, recent travel, leg pain, redness or swelling. She under went left partial pneumonectomy after she was diagnosed with lung cancer in 2009, she did not have anymore treatments like Radiation or chemotherapy. She has regularly  followed up for Ct scans.   Meds: Current Facility-Administered Medications  Medication Dose Route Frequency Provider Last Rate Last Dose  . albuterol (PROVENTIL) (2.5 MG/3ML) 0.083% nebulizer solution 5 mg  5 mg Nebulization Q2H PRN Rolland Porter, MD      . heparin ADULT infusion 100 units/mL (25000 units/250 mL)  900 Units/hr Intravenous Continuous Thomes Lolling, RPH 9 mL/hr at 04/16/15 0749 900 Units/hr at 04/16/15 0749    Allergies: Allergies as of 04/16/2015  . (No Known Allergies)   Past Medical History  Diagnosis Date  . Cancer     left lung/ 2009/ surg only  . Hypertension   . Arthritis   . Diverticulitis   . Diabetes mellitus   . H/O ventral hernia   . Lung cancer   . COPD (chronic obstructive pulmonary disease)   . Osteoporosis   . Adenocarcinoma of lung 08/20/2012   Past Surgical History  Procedure Laterality Date  . Cholecystectomy    . Ectopic pregnancy surgery    . Abdominal hysterectomy    . Lung cancer surgery    . Incisional hernia repair N/A 08/26/2013    Procedure: HERNIA REPAIR INCISIONAL WITH MESH;  Surgeon: Jamesetta So, MD;  Location: [facility];  Service: General;  Laterality: N/A;  . Colonoscopy N/A 09/18/2014    Procedure: COLONOSCOPY;  Surgeon: Danie Binder, MD;  Location: [facility];  Service: Endoscopy;  Laterality: N/A;  8:30 AM - moved to 10:30 Rosendo Gros to notify pt   Family History  Problem Relation Age of Onset  . Diabetes Mother   . Hypertension Mother   . Diabetes Father   . Asthma    . Cancer    . Colon cancer Neg Hx    History   Social History  . Marital Status: Widowed    Spouse Name: N/A  . Number of Children: N/A  . Years of Education: N/A   Occupational History  . Not on file.   Social History Main Topics  . Smoking status: Former Research scientist (life sciences)  . Smokeless tobacco: Never Used  . Alcohol Use: No  . Drug Use: No  . Sexual Activity: Not Currently    Birth Control/ Protection: Surgical   Other Topics Concern  . Not  on file   Social History Narrative   Review of Systems: CONSTITUTIONAL- No Fever, weightloss, night sweat or change in appetite. SKIN- No Rash. HEAD- No Headache or dizziness. GI- No nausea, vomiting, diarrhoea, constipation, abd pain. URINARY- No Frequency, urgency, straining or dysuria.  Physical Exam: Blood pressure 133/64, pulse 96, temperature 98.1 F (36.7 C), temperature source Oral, resp. rate 15, height '5\' 4"'$  (1.626 m), weight 179 lb (81.194 kg), SpO2 96 %. GENERAL- alert, co-operative, appears as stated age, not in any distress. HEENT- Atraumatic, normocephalic, PERRL, EOMI, oral mucosa appears dry, neck supple. CARDIAC- RRR, no murmurs, rubs or gallops appreciated RESP- diffuse expiratory wheezes heard, reduced breath sounds left lower region- prior pneumonectomy, but present and equal other lobes, no crackles. ABDOMEN- Soft, nontender, no palpable masses or organomegaly, bowel sounds present. BACK- Normal curvature of the spine, No tenderness along the vertebrae, no CVA tenderness. NEURO- No obvious Cr N abnormality, alert and oriented EXTREMITIES- pulse 2+, symmetric, trace, if any pedal edema. SKIN- Warm, dry, No rash or lesion, distended veins around the ankles. PSYCH- Normal mood and affect, appropriate thought content and speech.  Lab results: Basic Metabolic Panel:  Recent Labs  04/16/15 0525  NA 139  K 4.1  CL 103  CO2 29  GLUCOSE 190*  BUN 14  CREATININE 1.36*  CALCIUM 9.1  MG 1.7   Liver Function Tests:  Recent Labs  04/16/15 0525  AST 25  ALT 20  ALKPHOS 49  BILITOT 0.3  PROT 6.9  ALBUMIN 3.6   CBC:  Recent Labs  04/16/15 0525  WBC 8.9  NEUTROABS 7.2  HGB 12.4  HCT 40.0  MCV 93.2  PLT 186   Cardiac Enzymes:  Recent Labs  04/16/15 0525  TROPONINI 0.23*   Imaging results:  Dg Chest Port 1 View  04/16/2015   CLINICAL DATA:  Sudden onset shortness of breath. History of left lung cancer 2009.  EXAM: PORTABLE CHEST - 1 VIEW   COMPARISON:  01/10/2015  FINDINGS: Normal heart size and pulmonary vascularity. Volume loss on the left consistent with partial pneumonectomy. Rib changes consistent with thoracotomy. No focal airspace disease or consolidation in the lungs. No blunting of costophrenic angles. No pneumothorax. Calcification of aorta. No change since prior study.  IMPRESSION: Postoperative changes in the left lung. No evidence of active pulmonary disease.   Electronically Signed   By: Lucienne Capers M.D.   On: 04/16/2015 05:39    Other results: EKG: Rate- sinus rhythm, P wave morphology- biphasic in V1, and large in II, III, and AVF, no PR depression, PR interval- 148, QTC- 479,  St segment elevation/J point elevation in V2, V3, V4 and V5, V6, T wave inversions- AVL only- and this is old. Compared 01/10/2015. These ST elevations appear new. Looking back at all her previous EKG there does not appear to be any similar ST or Joint elevations in the past.  Assessment, Plan, & Recommendations by Problem: Active Problems:   Elevated troponin   COPD exacerbation   CKD (chronic kidney disease) stage 3, GFR 30-59 ml/min   Nonspecific abnormal electrocardiogram (ECG) (EKG)  Elevated Troponin- First troponin slightly elevated -0.23. Likely related to ongoing COPD exacerbation. Highly doubt possible concomitant viral pericarditis- Considering no symptoms, no rubs and only EKG findings- inferior and lateral J point elevations- the morphology of which is not consistent with an STEMI. - Trend trops - HGBA1c- for risk stratification - Lipid panel in am - ECHO with abnormal P wave morphology suggesting have atria dilation. - TIMI score- 5, pending results of next troponion, consider outpatient stress test when acute issues resolve.  J point elevation- Presentation not typical for pericarditis without chest pain. No fever and no leukocytosis. Patient has St elevations which are in lateral and inferior leads, also with elevated  troponins which could signify some myocardial involvement, but most likely due to COPD exacerbation. Looking back at all her previous EKG there does not appear to be any similar ST or Joint elevations in the past. - Will not treat considering no symptoms, NSAIDS contraindicated considering CKD3. No steroids. - ECHO - Repeat EKG in the am  COPD exacerbation- Likely precipitated by viral infection, presently on nebulizers- Albuterol. - Will add Symbicort- Budesonide and formoterol BID - Considering significant improvement will not start Steroids - Switch Albuterol to Q4H - Doxycycline- renal and QTc friendly.   CKD- 3- Cr appears stable at 1.36, at baseline. GFR- 42.  DM- Not on meds. Not on statin. -HGBA1c  Dispo: Disposition is deferred at this time, awaiting improvement of current medical problems.   Signed: Bethena Roys, MD  PGY-2, IMTS 04/16/2015, 9:47 AM  Patient seen and examined. I agree with the assessment and plan as detailed above. See also my additional thoughts below.   I took the original phone call from the emergency room and [facility]. She has J-point elevation on her EKG. She did not have this on prior EKGs. She actually does have some PR depression. However I think she has had this in the past. She does not have any significant symptoms suggesting clinical pericarditis. Therefore I'm hesitant to assume that the EKG changes are related to pericarditis. It would not be appropriate to use steroids in this setting. I would also be hesitant to use NSAIDs. It might be reasonable to consider colchicine. However, I'm not convinced that we should use colchicine with no evidence of chest discomfort at this time. The plan for now will be to treat her pulmonary status and follow her cardiac status. Further troponins will be obtained. She will have a 2-D echo. She should probably have a stress test at some point in the future.  Dola Argyle, MD, [facility] 04/16/2015 11:54 AM

## 2015-04-16 NOTE — ED Notes (Signed)
Pt brought in by [facility] for c/o sob; pt has had duoneb and albuterol tx en route to facility; pt has had '125mg'$  of solumedrol en route to hospital by [facility];

## 2015-04-17 ENCOUNTER — Encounter (HOSPITAL_COMMUNITY)
Admission: EM | Disposition: A | Discharge status: Home / Self Care | Payer: Medicare Other | Source: Home / Self Care | Attending: Cardiology

## 2015-04-17 ENCOUNTER — Ambulatory Visit (HOSPITAL_COMMUNITY): Payer: Medicare Other

## 2015-04-17 DIAGNOSIS — I2510 Atherosclerotic heart disease of native coronary artery without angina pectoris: Secondary | ICD-10-CM

## 2015-04-17 DIAGNOSIS — R9431 Abnormal electrocardiogram [ECG] [EKG]: Secondary | ICD-10-CM

## 2015-04-17 HISTORY — PX: CARDIAC CATHETERIZATION: SHX172

## 2015-04-17 LAB — CBC
HCT: 37.1 % (ref 36.0–46.0)
Hemoglobin: 11.6 g/dL — ABNORMAL LOW (ref 12.0–15.0)
MCH: 28.2 pg (ref 26.0–34.0)
MCHC: 31.3 g/dL (ref 30.0–36.0)
MCV: 90.3 fL (ref 78.0–100.0)
Platelets: 219 10*3/uL (ref 150–400)
RBC: 4.11 MIL/uL (ref 3.87–5.11)
RDW: 15.1 % (ref 11.5–15.5)
WBC: 12.2 10*3/uL — ABNORMAL HIGH (ref 4.0–10.5)

## 2015-04-17 LAB — BASIC METABOLIC PANEL
Anion gap: 9 (ref 5–15)
BUN: 19 mg/dL (ref 6–20)
CO2: 26 mmol/L (ref 22–32)
Calcium: 9.4 mg/dL (ref 8.9–10.3)
Chloride: 102 mmol/L (ref 101–111)
Creatinine, Ser: 1.29 mg/dL — ABNORMAL HIGH (ref 0.44–1.00)
GFR calc Af Amer: 44 mL/min — ABNORMAL LOW (ref >60)
GFR calc non Af Amer: 38 mL/min — ABNORMAL LOW (ref >60)
Glucose, Bld: 131 mg/dL — ABNORMAL HIGH (ref 70–99)
Potassium: 5 mmol/L (ref 3.5–5.1)
Sodium: 137 mmol/L (ref 135–145)

## 2015-04-17 LAB — LIPID PANEL
Cholesterol: 157 mg/dL (ref 0–200)
HDL: 59 mg/dL (ref >40)
LDL Cholesterol: 81 mg/dL (ref 0–99)
Total CHOL/HDL Ratio: 2.7 RATIO
Triglycerides: 87 mg/dL (ref <150)
VLDL: 17 mg/dL (ref 0–40)

## 2015-04-17 LAB — PROTIME-INR
INR: 1.06 (ref 0.00–1.49)
Prothrombin Time: 14 seconds (ref 11.6–15.2)

## 2015-04-17 LAB — POCT ACTIVATED CLOTTING TIME: Activated Clotting Time: 85 seconds

## 2015-04-17 LAB — MAGNESIUM: Magnesium: 3.1 mg/dL — ABNORMAL HIGH (ref 1.7–2.4)

## 2015-04-17 LAB — HEPARIN LEVEL (UNFRACTIONATED)
Heparin Unfractionated: 0.33 IU/mL (ref 0.30–0.70)
Heparin Unfractionated: 0.48 IU/mL (ref 0.30–0.70)

## 2015-04-17 SURGERY — LEFT HEART CATH AND CORONARY ANGIOGRAPHY
Anesthesia: LOCAL

## 2015-04-17 MED ORDER — MIDAZOLAM HCL 2 MG/2ML IJ SOLN
INTRAMUSCULAR | Status: AC
  Filled 2015-04-17: qty 2

## 2015-04-17 MED ORDER — IOHEXOL 350 MG/ML SOLN
INTRAVENOUS | Status: DC | PRN
  Administered 2015-04-17: 100 mL via INTRAVENOUS

## 2015-04-17 MED ORDER — ASPIRIN 81 MG PO CHEW
81.0000 mg | CHEWABLE_TABLET | ORAL | Status: AC
  Administered 2015-04-17: 81 mg via ORAL
  Filled 2015-04-17: qty 1

## 2015-04-17 MED ORDER — SODIUM CHLORIDE 0.9 % IV SOLN
INTRAVENOUS | Status: DC
  Administered 2015-04-17: 10:00:00 via INTRAVENOUS

## 2015-04-17 MED ORDER — SODIUM CHLORIDE 0.9 % IV SOLN: 250.0000 mL | INTRAVENOUS | Status: DC | PRN

## 2015-04-17 MED ORDER — FENTANYL CITRATE (PF) 100 MCG/2ML IJ SOLN
INTRAMUSCULAR | Status: AC
  Filled 2015-04-17: qty 2

## 2015-04-17 MED ORDER — SODIUM CHLORIDE 0.9 % IJ SOLN: 3.0000 mL | INTRAMUSCULAR | Status: DC | PRN

## 2015-04-17 MED ORDER — ONDANSETRON HCL 4 MG/2ML IJ SOLN
4.0000 mg | Freq: Four times a day (QID) | INTRAMUSCULAR | Status: DC | PRN
  Administered 2015-04-17: 4 mg via INTRAVENOUS
  Filled 2015-04-17: qty 2

## 2015-04-17 MED ORDER — ALBUTEROL SULFATE (2.5 MG/3ML) 0.083% IN NEBU
5.0000 mg | INHALATION_SOLUTION | Freq: Four times a day (QID) | RESPIRATORY_TRACT | Status: DC
  Administered 2015-04-17 – 2015-04-20 (×13): 5 mg via RESPIRATORY_TRACT
  Filled 2015-04-17 (×13): qty 6

## 2015-04-17 MED ORDER — SODIUM CHLORIDE 0.9 % WEIGHT BASED INFUSION
1.0000 mL/kg/h | INTRAVENOUS | Status: AC
Start: 2015-04-17 — End: 2015-04-17

## 2015-04-17 MED ORDER — LIDOCAINE HCL (PF) 1 % IJ SOLN
INTRAMUSCULAR | Status: AC
  Filled 2015-04-17: qty 30

## 2015-04-17 MED ORDER — CARVEDILOL 3.125 MG PO TABS
3.1250 mg | ORAL_TABLET | Freq: Two times a day (BID) | ORAL | Status: DC
  Filled 2015-04-17 (×3): qty 1

## 2015-04-17 MED ORDER — NITROGLYCERIN 1 MG/10 ML FOR IR/CATH LAB
INTRA_ARTERIAL | Status: AC
  Filled 2015-04-17: qty 10

## 2015-04-17 MED ORDER — HEPARIN (PORCINE) IN NACL 2-0.9 UNIT/ML-% IJ SOLN
INTRAMUSCULAR | Status: AC
  Filled 2015-04-17: qty 1000

## 2015-04-17 MED ORDER — MIDAZOLAM HCL 2 MG/2ML IJ SOLN
INTRAMUSCULAR | Status: DC | PRN
  Administered 2015-04-17: 1 mg via INTRAVENOUS

## 2015-04-17 MED ORDER — PANTOPRAZOLE SODIUM 40 MG PO TBEC
40.0000 mg | DELAYED_RELEASE_TABLET | Freq: Every day | ORAL | Status: DC
  Administered 2015-04-17 – 2015-04-20 (×4): 40 mg via ORAL
  Filled 2015-04-17 (×4): qty 1

## 2015-04-17 MED ORDER — SODIUM CHLORIDE 0.9 % IJ SOLN
3.0000 mL | Freq: Two times a day (BID) | INTRAMUSCULAR | Status: DC
  Administered 2015-04-17 – 2015-04-20 (×5): 3 mL via INTRAVENOUS

## 2015-04-17 MED ORDER — CARVEDILOL 3.125 MG PO TABS
3.1250 mg | ORAL_TABLET | Freq: Two times a day (BID) | ORAL | Status: DC
  Filled 2015-04-17 (×2): qty 1

## 2015-04-17 MED ORDER — ALUM & MAG HYDROXIDE-SIMETH 200-200-20 MG/5ML PO SUSP
30.0000 mL | ORAL | Status: DC | PRN
  Administered 2015-04-17: 30 mL via ORAL
  Filled 2015-04-17: qty 30

## 2015-04-17 MED ORDER — SODIUM CHLORIDE 0.9 % IJ SOLN
3.0000 mL | Freq: Two times a day (BID) | INTRAMUSCULAR | Status: DC
  Administered 2015-04-17: 3 mL via INTRAVENOUS

## 2015-04-17 MED ORDER — ACETAMINOPHEN 325 MG PO TABS
650.0000 mg | ORAL_TABLET | ORAL | Status: DC | PRN
  Administered 2015-04-19: 650 mg via ORAL
  Filled 2015-04-17 (×2): qty 2

## 2015-04-17 MED ORDER — METOPROLOL TARTRATE 12.5 MG HALF TABLET
12.5000 mg | ORAL_TABLET | Freq: Two times a day (BID) | ORAL | Status: DC
Start: 2015-04-17 — End: 2015-04-19
  Administered 2015-04-17 – 2015-04-19 (×5): 12.5 mg via ORAL
  Filled 2015-04-17 (×7): qty 1

## 2015-04-17 MED ORDER — FENTANYL CITRATE (PF) 100 MCG/2ML IJ SOLN
INTRAMUSCULAR | Status: DC | PRN
  Administered 2015-04-17: 25 ug via INTRAVENOUS

## 2015-04-17 MED ORDER — MAGNESIUM SULFATE 4 GM/100ML IV SOLN
4.0000 g | Freq: Once | INTRAVENOUS | Status: AC
  Administered 2015-04-17: 4 g via INTRAVENOUS
  Filled 2015-04-17: qty 100

## 2015-04-17 MED ORDER — ASPIRIN EC 81 MG PO TBEC
81.0000 mg | DELAYED_RELEASE_TABLET | Freq: Every day | ORAL | Status: DC
  Administered 2015-04-18 – 2015-04-20 (×3): 81 mg via ORAL
  Filled 2015-04-17 (×3): qty 1

## 2015-04-17 MED ORDER — HEPARIN SODIUM (PORCINE) 5000 UNIT/ML IJ SOLN
5000.0000 [IU] | Freq: Three times a day (TID) | INTRAMUSCULAR | Status: DC
  Administered 2015-04-18 – 2015-04-20 (×6): 5000 [IU] via SUBCUTANEOUS
  Filled 2015-04-17 (×10): qty 1

## 2015-04-17 MED ORDER — ASPIRIN 325 MG PO TABS: 325.0000 mg | ORAL_TABLET | Freq: Every day | ORAL | Status: DC

## 2015-04-17 SURGICAL SUPPLY — 8 items
CATH INFINITI 5FR MULTPACK ANG (CATHETERS) IMPLANT
KIT HEART LEFT (KITS) ×3 IMPLANT
PACK CARDIAC CATHETERIZATION (CUSTOM PROCEDURE TRAY) ×3 IMPLANT
SHEATH PINNACLE 5F 10CM (SHEATH) IMPLANT
SYR MEDRAD MARK V 150ML (SYRINGE) ×3 IMPLANT
TRANSDUCER W/STOPCOCK (MISCELLANEOUS) ×3 IMPLANT
TUBING CIL FLEX 10 FLL-RA (TUBING) ×3 IMPLANT
WIRE EMERALD 3MM-J .035X150CM (WIRE) IMPLANT

## 2015-04-17 NOTE — Interval H&P Note (Signed)
Cath Lab Visit (complete for each Cath Lab visit)  Clinical Evaluation Leading to the Procedure:   ACS: Yes.    Non-ACS:    Anginal Classification: CCS IV  Anti-ischemic medical therapy: Maximal Therapy (2 or more classes of medications)  Non-Invasive Test Results: No non-invasive testing performed  Prior CABG: No previous CABG      History and Physical Interval Note:  04/17/2015 3:59 PM  Angela Lawson  has presented today for surgery, with the diagnosis of Chest Pain  The various methods of treatment have been discussed with the patient and family. After consideration of risks, benefits and other options for treatment, the patient has consented to  Procedure(s): Left Heart Cath and Coronary Angiography (N/A) as a surgical intervention .  The patient's history has been reviewed, patient examined, no change in status, stable for surgery.  I have reviewed the patient's chart and labs.  Questions were answered to the patient's satisfaction.     KELLY,THOMAS A

## 2015-04-17 NOTE — Progress Notes (Signed)
Utilization review completed. Leonna Schlee, RN, BSN. 

## 2015-04-17 NOTE — Progress Notes (Signed)
ANTICOAGULATION CONSULT NOTE - Follow Up Consult  Pharmacy Consult for heparin Indication: chest pain/ACS  Labs:  Recent Labs  04/16/15 0525 04/16/15 1015 04/16/15 1730 04/16/15 2235 04/17/15 0254  HGB 12.4  --   --   --  11.6*  HCT 40.0  --   --   --  37.1  PLT 186  --   --   --  219  LABPROT  --   --   --   --  14.0  INR  --   --   --   --  1.06  HEPARINUNFRC  --   --  0.19*  --  0.33  CREATININE 1.36*  --   --   --   --   TROPONINI 0.23* 0.91* 1.68* 2.61*  --      Assessment/Plan:  81yo female therapeutic on heparin after rate increase. Will continue gtt at current rate and confirm stable with additional level.   Wynona Neat, PharmD, BCPS  04/17/2015,4:28 AM

## 2015-04-17 NOTE — Progress Notes (Signed)
Pt back in room, NAD VSS, eating dinner, groin site level 0 no s/s hematoma or bleeding, pulses distal +2. Family bedside.

## 2015-04-17 NOTE — Progress Notes (Signed)
Subjective: No complaints overnight, except not sleeping. Said she might have had a bit of indigestion causing some chest discomfort but that resolved quickly. Says breathing has improved except when she exerts herself. No diaphoresis or nausea.  Objective: Vital signs in last 24 hours: Filed Vitals:   04/17/15 0100 04/17/15 0200 04/17/15 0300 04/17/15 0400  BP: 135/63 151/70 140/76 141/64  Pulse: 85 44 83 84  Temp:    97.7 F (36.5 C)  TempSrc:    Oral  Resp: '14 15 25 15  '$ Height:      Weight:      SpO2: 98% 98% 98% 98%   Weight change:   Intake/Output Summary (Last 24 hours) at 04/17/15 5329 Last data filed at 04/17/15 0600  Gross per 24 hour  Intake 998.68 ml  Output   1600 ml  Net -601.32 ml   General appearance: alert, cooperative, appears stated age and no distress Head: Normocephalic, without obvious abnormality, atraumatic Lungs: Mild expiratory diffuse wheezes. Heart: regular rate and rhythm, S1, S2 normal, no murmur, click, rub or gallop Abdomen: soft, non-tender; bowel sounds normal; no masses,  no organomegaly Extremities: extremities normal, atraumatic, no cyanosis or edema Skin: Skin color, texture, turgor normal. No rashes or lesions   Tele- No significant events, just T waves- inverted apparent.   Lab Results: Basic Metabolic Panel:  Recent Labs Lab 04/16/15 0525 04/17/15 0532  NA 139 137  K 4.1 5.0  CL 103 102  CO2 29 26  GLUCOSE 190* 131*  BUN 14 19  CREATININE 1.36* 1.29*  CALCIUM 9.1 9.4  MG 1.7 3.1*   Liver Function Tests:  Recent Labs Lab 04/16/15 0525  AST 25  ALT 20  ALKPHOS 49  BILITOT 0.3  PROT 6.9  ALBUMIN 3.6   CBC:  Recent Labs Lab 04/16/15 0525 04/17/15 0254  WBC 8.9 12.2*  NEUTROABS 7.2  --   HGB 12.4 11.6*  HCT 40.0 37.1  MCV 93.2 90.3  PLT 186 219   Cardiac Enzymes:  Recent Labs Lab 04/16/15 1015 04/16/15 1730 04/16/15 2235  TROPONINI 0.91* 1.68* 2.61*   Coagulation:  Recent Labs Lab  04/17/15 0254  LABPROT 14.0  INR 1.06   Micro Results: Recent Results (from the past 240 hour(s))  MRSA PCR Screening     Status: None   Collection Time: 04/16/15  9:11 AM  Result Value Ref Range Status   MRSA by PCR NEGATIVE NEGATIVE Final    Comment:        The GeneXpert MRSA Assay (FDA approved for NASAL specimens only), is one component of a comprehensive MRSA colonization surveillance program. It is not intended to diagnose MRSA infection nor to guide or monitor treatment for MRSA infections.    Studies/Results: Dg Chest Port 1 View  04/16/2015   CLINICAL DATA:  Sudden onset shortness of breath. History of left lung cancer 2009.  EXAM: PORTABLE CHEST - 1 VIEW  COMPARISON:  01/10/2015  FINDINGS: Normal heart size and pulmonary vascularity. Volume loss on the left consistent with partial pneumonectomy. Rib changes consistent with thoracotomy. No focal airspace disease or consolidation in the lungs. No blunting of costophrenic angles. No pneumothorax. Calcification of aorta. No change since prior study.  IMPRESSION: Postoperative changes in the left lung. No evidence of active pulmonary disease.   Electronically Signed   By: Lucienne Capers M.D.   On: 04/16/2015 05:39   Medications: I have reviewed the patient's current medications. Scheduled Meds: . antiseptic oral rinse  7  mL Mouth Rinse BID  . budesonide-formoterol  2 puff Inhalation BID  . doxycycline  100 mg Oral Q12H   Continuous Infusions: . heparin 1,050 Units/hr (04/16/15 2000)   PRN Meds:.albuterol Assessment/Plan: Active Problems:   Elevated troponin   COPD exacerbation   CKD (chronic kidney disease) stage 3, GFR 30-59 ml/min   Nonspecific abnormal electrocardiogram (ECG) (EKG)  NSTEMI- Without typical symptoms- Increasing troponin to 2.61, gradual increase form 0.23 on admission. Also with new deep T wave inversion, form original J point elevation on admission. Being female and her diagnosis of DM , might  account for unusual presentation. Also with prolonged Qtc this am- 515. - Consider cath today, but caution with CKD3 - Pre-hydration with saline - HGBA1c- for risk stratification - Lipid panel- fasting - ECHO- Pending - Cont heparin infusion - Will start metoprolol 12.'5mg'$  BID considering concomittant COPD, BP and pulse can tolerate. On benazepril and HCTZ -20-12.'5mg'$  daily at home for BP. - Aspirin '81mg'$  daily  COPD exacerbation- Likely precipitated by viral infection, presently on nebulizers- Albuterol. -  Cont Symbicort- Budesonide and formoterol BID - Considering significant improvement will not start Steroids - Switch Albuterol to Q6H - Cont Doxycycline- renal and QTc friendly.   CKD- 3- Cr appears stable at 1.29, at baseline.  DM- Not on meds. Not on statin. -HGBA1c pending  Family updated at bedside- Daughter and son.   LOS: 1 day   Bethena Roys, MD 04/17/2015, 7:12 AM  PGY-2 IMTS  I have examined the patient and reviewed assessment and plan and discussed with patient.  Agree with above as stated.  Plan for cath today.  High likelihood of LAD disease given the ECG changes this AM and positive troponin.  Add hydration.  The patient understands that risks include but are not limited to stroke (1 in 1000), death (1 in 43), kidney failure [usually temporary] (1 in 500), bleeding (1 in 200), allergic reaction [possibly serious] (1 in 200), and agrees to proceed.   Cath later today.   Bassel Gaskill S.

## 2015-04-17 NOTE — Progress Notes (Signed)
  Echocardiogram 2D Echocardiogram has been performed.  Angela Lawson 04/17/2015, 11:10 AM

## 2015-04-17 NOTE — Progress Notes (Signed)
Pt off floor to scheduled cath lab per MD order, consent signed, family notified in waiting room. NAD VSS heparin gtt stopped on arrival.

## 2015-04-17 NOTE — Progress Notes (Signed)
ANTICOAGULATION CONSULT NOTE - Follow-up Consult  Pharmacy Consult for Heparin Indication: chest pain/ACS  No Known Allergies  Patient Measurements: Height: '5\' 4"'$  (162.6 cm) Weight: 179 lb (81.194 kg) IBW/kg (Calculated) : 54.7 HEPARIN DW (KG): 72.2   Vital Signs: Temp: 98.3 F (36.8 C) (05/10 1200) Temp Source: Oral (05/10 1200) BP: 148/84 mmHg (05/10 1200) Pulse Rate: 80 (05/10 1200)  Labs:  Recent Labs  04/16/15 0525 04/16/15 1015 04/16/15 1730 04/16/15 2235 04/17/15 0254 04/17/15 0532 04/17/15 1100  HGB 12.4  --   --   --  11.6*  --   --   HCT 40.0  --   --   --  37.1  --   --   PLT 186  --   --   --  219  --   --   LABPROT  --   --   --   --  14.0  --   --   INR  --   --   --   --  1.06  --   --   HEPARINUNFRC  --   --  0.19*  --  0.33  --  0.48  CREATININE 1.36*  --   --   --   --  1.29*  --   TROPONINI 0.23* 0.91* 1.68* 2.61*  --   --   --     Estimated Creatinine Clearance: 36.5 mL/min (by C-G formula based on Cr of 1.29).   Medical History: Past Medical History  Diagnosis Date  . Cancer     left lung/ 2009/ surg only  . Hypertension   . Arthritis   . Diverticulitis   . Diabetes mellitus   . H/O ventral hernia   . Lung cancer   . COPD (chronic obstructive pulmonary disease)   . Osteoporosis   . Adenocarcinoma of lung 08/20/2012    Medications:  Prescriptions prior to admission  Medication Sig Dispense Refill Last Dose  . albuterol (PROAIR HFA) 108 (90 BASE) MCG/ACT inhaler Inhale 2 puffs into the lungs every 4 (four) hours as needed for wheezing or shortness of breath.   04/16/2015 at Unknown time  . alendronate (FOSAMAX) 70 MG tablet Take 70 mg by mouth once a week. On Fridays   04/13/2015  . aspirin EC 81 MG tablet Take 81 mg by mouth daily.   04/15/2015 at Unknown time  . benazepril-hydrochlorthiazide (LOTENSIN HCT) 20-12.5 MG per tablet Take 0.5 tablets by mouth daily.    04/15/2015 at Unknown time  . buPROPion (WELLBUTRIN SR) 150 MG 12 hr tablet  Take 150 mg by mouth 2 (two) times daily.   04/15/2015 at pm  . Calcium Carbonate-Vitamin D (CALCIUM 600 + D PO) Take 1 tablet by mouth daily.   04/15/2015 at Unknown time  . fish oil-omega-3 fatty acids 1000 MG capsule Take 1 g by mouth 2 (two) times daily.    04/15/2015 at Unknown time  . gabapentin (NEURONTIN) 300 MG capsule Take 300 mg by mouth daily.    04/15/2015 at Unknown time  . Garlic 505 MG TABS Take 300 mg by mouth daily.   04/15/2015 at Unknown time  . ipratropium-albuterol (DUONEB) 0.5-2.5 (3) MG/3ML SOLN Inhale 3 mLs into the lungs every 4 (four) hours as needed (wheezing/shortness of breath).    04/16/2015 at Unknown time  . linagliptin (TRADJENTA) 5 MG TABS tablet Take 5 mg by mouth daily.   04/15/2015 at Unknown time  . meclizine (ANTIVERT) 25 MG tablet Take 1 tablet  by mouth 3 (three) times daily.   04/15/2015 at Unknown time  . omeprazole (PRILOSEC) 20 MG capsule Take 20 mg by mouth daily.   04/15/2015 at Unknown time  . rOPINIRole (REQUIP) 0.25 MG tablet Take 0.25 mg by mouth 3 (three) times daily.    04/15/2015 at Unknown time  . acyclovir (ZOVIRAX) 400 MG tablet Take 1 tablet (400 mg total) by mouth 5 (five) times daily. (Patient not taking: Reported on 01/10/2015) 25 tablet 0 Not Taking at Unknown time  . clotrimazole-betamethasone (LOTRISONE) cream Apply 1 application topically 2 (two) times daily. Apply to rash (Patient not taking: Reported on 01/10/2015) 30 g 2 Not Taking at Unknown time  . denosumab (PROLIA) 60 MG/ML SOLN injection Inject 60 mg into the skin every 6 (six) months. Administer in upper arm, thigh, or abdomen   year ago  . HYDROcodone-acetaminophen (NORCO/VICODIN) 5-325 MG per tablet Take 1 tablet by mouth every 4 (four) hours as needed. (Patient not taking: Reported on 01/10/2015) 84 tablet 0 Not Taking at Unknown time  . metroNIDAZOLE (FLAGYL) 500 MG tablet Take 1 tablet (500 mg total) by mouth 2 (two) times daily. (Patient not taking: Reported on 01/10/2015) 14 tablet 0 Not Taking at  Unknown time  . nystatin (MYCOSTATIN/NYSTOP) 100000 UNIT/GM POWD Apply to rash/ moist areas under breasts, abdominal folds and back three times a day until healed, keep areas dry. (Patient not taking: Reported on 01/10/2015) 30 g 0 Not Taking at Unknown time    Assessment: 81 yo F who presents with shortness of breath, wheezing.  Pharmacy consulted to dose heparin for ACS. Troponins continue to trend up, now at 2.61.  Planned cath for later today.   5/9 EKG significant for lateral infarct. Confirmatory HL is therapeutic at 0.48 on 1050 units of heparin/hr.  Hgb trended down to 11.6, plts WNL at 219.  No bleeding noted.   Goal of Therapy:  Heparin level 0.3-0.7 units/ml Monitor platelets by anticoagulation protocol: Yes   Plan:  -Continue heparin at 1050 units/hr -Monitor Daily HL, CBC, signs of bleeding -F/U Plans for cath and need for heparin post-cath  Theron Arista, PharmD Clinical Pharmacist - Resident Pager: [phone] 5/10/20161:16 PM

## 2015-04-17 NOTE — H&P (View-Only) (Signed)
Subjective: No complaints overnight, except not sleeping. Said she might have had a bit of indigestion causing some chest discomfort but that resolved quickly. Says breathing has improved except when she exerts herself. No diaphoresis or nausea.  Objective: Vital signs in last 24 hours: Filed Vitals:   04/17/15 0100 04/17/15 0200 04/17/15 0300 04/17/15 0400  BP: 135/63 151/70 140/76 141/64  Pulse: 85 44 83 84  Temp:    97.7 F (36.5 C)  TempSrc:    Oral  Resp: '14 15 25 15  '$ Height:      Weight:      SpO2: 98% 98% 98% 98%   Weight change:   Intake/Output Summary (Last 24 hours) at 04/17/15 9629 Last data filed at 04/17/15 0600  Gross per 24 hour  Intake 998.68 ml  Output   1600 ml  Net -601.32 ml   General appearance: alert, cooperative, appears stated age and no distress Head: Normocephalic, without obvious abnormality, atraumatic Lungs: Mild expiratory diffuse wheezes. Heart: regular rate and rhythm, S1, S2 normal, no murmur, click, rub or gallop Abdomen: soft, non-tender; bowel sounds normal; no masses,  no organomegaly Extremities: extremities normal, atraumatic, no cyanosis or edema Skin: Skin color, texture, turgor normal. No rashes or lesions   Tele- No significant events, just T waves- inverted apparent.   Lab Results: Basic Metabolic Panel:  Recent Labs Lab 04/16/15 0525 04/17/15 0532  NA 139 137  K 4.1 5.0  CL 103 102  CO2 29 26  GLUCOSE 190* 131*  BUN 14 19  CREATININE 1.36* 1.29*  CALCIUM 9.1 9.4  MG 1.7 3.1*   Liver Function Tests:  Recent Labs Lab 04/16/15 0525  AST 25  ALT 20  ALKPHOS 49  BILITOT 0.3  PROT 6.9  ALBUMIN 3.6   CBC:  Recent Labs Lab 04/16/15 0525 04/17/15 0254  WBC 8.9 12.2*  NEUTROABS 7.2  --   HGB 12.4 11.6*  HCT 40.0 37.1  MCV 93.2 90.3  PLT 186 219   Cardiac Enzymes:  Recent Labs Lab 04/16/15 1015 04/16/15 1730 04/16/15 2235  TROPONINI 0.91* 1.68* 2.61*   Coagulation:  Recent Labs Lab  04/17/15 0254  LABPROT 14.0  INR 1.06   Micro Results: Recent Results (from the past 240 hour(s))  MRSA PCR Screening     Status: None   Collection Time: 04/16/15  9:11 AM  Result Value Ref Range Status   MRSA by PCR NEGATIVE NEGATIVE Final    Comment:        The GeneXpert MRSA Assay (FDA approved for NASAL specimens only), is one component of a comprehensive MRSA colonization surveillance program. It is not intended to diagnose MRSA infection nor to guide or monitor treatment for MRSA infections.    Studies/Results: Dg Chest Port 1 View  04/16/2015   CLINICAL DATA:  Sudden onset shortness of breath. History of left lung cancer 2009.  EXAM: PORTABLE CHEST - 1 VIEW  COMPARISON:  01/10/2015  FINDINGS: Normal heart size and pulmonary vascularity. Volume loss on the left consistent with partial pneumonectomy. Rib changes consistent with thoracotomy. No focal airspace disease or consolidation in the lungs. No blunting of costophrenic angles. No pneumothorax. Calcification of aorta. No change since prior study.  IMPRESSION: Postoperative changes in the left lung. No evidence of active pulmonary disease.   Electronically Signed   By: Lucienne Capers M.D.   On: 04/16/2015 05:39   Medications: I have reviewed the patient's current medications. Scheduled Meds: . antiseptic oral rinse  7  mL Mouth Rinse BID  . budesonide-formoterol  2 puff Inhalation BID  . doxycycline  100 mg Oral Q12H   Continuous Infusions: . heparin 1,050 Units/hr (04/16/15 2000)   PRN Meds:.albuterol Assessment/Plan: Active Problems:   Elevated troponin   COPD exacerbation   CKD (chronic kidney disease) stage 3, GFR 30-59 ml/min   Nonspecific abnormal electrocardiogram (ECG) (EKG)  NSTEMI- Without typical symptoms- Increasing troponin to 2.61, gradual increase form 0.23 on admission. Also with new deep T wave inversion, form original J point elevation on admission. Being female and her diagnosis of DM , might  account for unusual presentation. Also with prolonged Qtc this am- 515. - Consider cath today, but caution with CKD3 - Pre-hydration with saline - HGBA1c- for risk stratification - Lipid panel- fasting - ECHO- Pending - Cont heparin infusion - Will start metoprolol 12.'5mg'$  BID considering concomittant COPD, BP and pulse can tolerate. On benazepril and HCTZ -20-12.'5mg'$  daily at home for BP. - Aspirin '81mg'$  daily  COPD exacerbation- Likely precipitated by viral infection, presently on nebulizers- Albuterol. -  Cont Symbicort- Budesonide and formoterol BID - Considering significant improvement will not start Steroids - Switch Albuterol to Q6H - Cont Doxycycline- renal and QTc friendly.   CKD- 3- Cr appears stable at 1.29, at baseline.  DM- Not on meds. Not on statin. -HGBA1c pending  Family updated at bedside- Daughter and son.   LOS: 1 day   Angela Roys, MD 04/17/2015, 7:12 AM  PGY-2 IMTS  I have examined the patient and reviewed assessment and plan and discussed with patient.  Agree with above as stated.  Plan for cath today.  High likelihood of LAD disease given the ECG changes this AM and positive troponin.  Add hydration.  The patient understands that risks include but are not limited to stroke (1 in 1000), death (1 in 52), kidney failure [usually temporary] (1 in 500), bleeding (1 in 200), allergic reaction [possibly serious] (1 in 200), and agrees to proceed.   Cath later today.   Nakiesha Rumsey S.

## 2015-04-17 NOTE — Progress Notes (Signed)
Site area: rt groin Site Prior to Removal:  Level  0 Pressure Applied For:  20 minutes Manual:   yes Patient Status During Pull:  stable Post Pull Site:  Level  0 Post Pull Instructions Given:  yes Post Pull Pulses Present: yes Dressing Applied:  tegaderm Bedrest begins @  1423 Comments:  none

## 2015-04-18 ENCOUNTER — Encounter (HOSPITAL_COMMUNITY): Payer: Self-pay | Admitting: Cardiovascular Disease

## 2015-04-18 DIAGNOSIS — N183 Chronic kidney disease, stage 3 (moderate): Secondary | ICD-10-CM

## 2015-04-18 DIAGNOSIS — R9431 Abnormal electrocardiogram [ECG] [EKG]: Secondary | ICD-10-CM

## 2015-04-18 DIAGNOSIS — I5181 Takotsubo syndrome: Secondary | ICD-10-CM | POA: Insufficient documentation

## 2015-04-18 LAB — CBC
HCT: 40.2 % (ref 36.0–46.0)
Hemoglobin: 12.6 g/dL (ref 12.0–15.0)
MCH: 29 pg (ref 26.0–34.0)
MCHC: 31.3 g/dL (ref 30.0–36.0)
MCV: 92.6 fL (ref 78.0–100.0)
Platelets: 199 10*3/uL (ref 150–400)
RBC: 4.34 MIL/uL (ref 3.87–5.11)
RDW: 15.5 % (ref 11.5–15.5)
WBC: 9.5 10*3/uL (ref 4.0–10.5)

## 2015-04-18 LAB — BASIC METABOLIC PANEL
Anion gap: 10 (ref 5–15)
BUN: 21 mg/dL — ABNORMAL HIGH (ref 6–20)
CO2: 27 mmol/L (ref 22–32)
Calcium: 9.3 mg/dL (ref 8.9–10.3)
Chloride: 100 mmol/L — ABNORMAL LOW (ref 101–111)
Creatinine, Ser: 1.36 mg/dL — ABNORMAL HIGH (ref 0.44–1.00)
GFR calc Af Amer: 42 mL/min — ABNORMAL LOW (ref >60)
GFR calc non Af Amer: 36 mL/min — ABNORMAL LOW (ref >60)
Glucose, Bld: 124 mg/dL — ABNORMAL HIGH (ref 70–99)
Potassium: 4.8 mmol/L (ref 3.5–5.1)
Sodium: 137 mmol/L (ref 135–145)

## 2015-04-18 LAB — HEMOGLOBIN A1C
Hgb A1c MFr Bld: 7.4 % — ABNORMAL HIGH (ref 4.8–5.6)
Mean Plasma Glucose: 166 mg/dL

## 2015-04-18 LAB — GLUCOSE, CAPILLARY: Glucose-Capillary: 74 mg/dL (ref 70–99)

## 2015-04-18 MED ORDER — NITROGLYCERIN 0.4 MG SL SUBL
SUBLINGUAL_TABLET | SUBLINGUAL | Status: AC
  Filled 2015-04-18: qty 1

## 2015-04-18 MED ORDER — NITROGLYCERIN 0.4 MG SL SUBL
0.4000 mg | SUBLINGUAL_TABLET | SUBLINGUAL | Status: DC | PRN
  Administered 2015-04-18 – 2015-04-19 (×4): 0.4 mg via SUBLINGUAL
  Filled 2015-04-18 (×2): qty 1

## 2015-04-18 MED FILL — Heparin Sodium (Porcine) 2 Unit/ML in Sodium Chloride 0.9%: INTRAMUSCULAR | Qty: 1000 | Status: AC

## 2015-04-18 MED FILL — Lidocaine HCl Local Preservative Free (PF) Inj 1%: INTRAMUSCULAR | Qty: 30 | Status: AC

## 2015-04-18 NOTE — Progress Notes (Signed)
Subjective: Eating breakfast, family at bedside. Some mild chest discomfort this am, resolved, says it felt like indigestion. She denies any recent emotional stressors. She has been having mostly COPD exacerbations as  The only stressors recently.  Objective: Vital signs in last 24 hours: Filed Vitals:   04/18/15 0500 04/18/15 0552 04/18/15 0600 04/18/15 0700  BP: 157/62 127/56 124/45 137/46  Pulse: 77 74 71 36  Temp:      TempSrc:      Resp: '19 24 16 14  '$ Height:      Weight:      SpO2: 98% 93% 97% 98%   Weight change:   Intake/Output Summary (Last 24 hours) at 04/18/15 0729 Last data filed at 04/18/15 0500  Gross per 24 hour  Intake 1599.7 ml  Output   2650 ml  Net -1050.3 ml   General appearance: alert, cooperative, appears stated age and no distress Head: Normocephalic, without obvious abnormality, atraumatic Lungs: Significantly improved breath sounds, without wheezes bilat. Heart: regular rate and rhythm, S1, S2 normal, no murmur, click, rub or gallop Abdomen: soft, non-tender; bowel sounds normal; no masses,  no organomegaly Extremities: extremities normal, atraumatic, no cyanosis or edema Skin: Skin color, texture, turgor normal. No rashes or lesions  Tele- Several PVCs, with runs of bigeminy. Rate- 70s- 80s   Lab Results: Basic Metabolic Panel:  Recent Labs Lab 04/16/15 0525 04/17/15 0532  NA 139 137  K 4.1 5.0  CL 103 102  CO2 29 26  GLUCOSE 190* 131*  BUN 14 19  CREATININE 1.36* 1.29*  CALCIUM 9.1 9.4  MG 1.7 3.1*   Liver Function Tests:  Recent Labs Lab 04/16/15 0525  AST 25  ALT 20  ALKPHOS 49  BILITOT 0.3  PROT 6.9  ALBUMIN 3.6   CBC:  Recent Labs Lab 04/16/15 0525 04/17/15 0254 04/18/15 0312  WBC 8.9 12.2* 9.5  NEUTROABS 7.2  --   --   HGB 12.4 11.6* 12.6  HCT 40.0 37.1 40.2  MCV 93.2 90.3 92.6  PLT 186 219 199   Cardiac Enzymes:  Recent Labs Lab 04/16/15 1015 04/16/15 1730 04/16/15 2235  TROPONINI 0.91* 1.68*  2.61*   Coagulation:  Recent Labs Lab 04/17/15 0254  LABPROT 14.0  INR 1.06   Micro Results: Recent Results (from the past 240 hour(s))  MRSA PCR Screening     Status: None   Collection Time: 04/16/15  9:11 AM  Result Value Ref Range Status   MRSA by PCR NEGATIVE NEGATIVE Final    Comment:        The GeneXpert MRSA Assay (FDA approved for NASAL specimens only), is one component of a comprehensive MRSA colonization surveillance program. It is not intended to diagnose MRSA infection nor to guide or monitor treatment for MRSA infections.    Studies/Results: No results found. Medications: I have reviewed the patient's current medications. Scheduled Meds: . albuterol  5 mg Nebulization Q6H  . antiseptic oral rinse  7 mL Mouth Rinse BID  . aspirin EC  81 mg Oral Daily  . budesonide-formoterol  2 puff Inhalation BID  . doxycycline  100 mg Oral Q12H  . heparin  5,000 Units Subcutaneous 3 times per day  . metoprolol tartrate  12.5 mg Oral BID  . pantoprazole  40 mg Oral Daily  . sodium chloride  3 mL Intravenous Q12H   Continuous Infusions:   PRN Meds:.sodium chloride, acetaminophen, alum & mag hydroxide-simeth, nitroGLYCERIN, ondansetron (ZOFRAN) IV, sodium chloride Assessment/Plan: Active Problems:  Elevated troponin   COPD exacerbation   CKD (chronic kidney disease) stage 3, GFR 30-59 ml/min   Nonspecific abnormal electrocardiogram (ECG) (EKG)   Abnormal EKG   Stress-induced cardiomyopathy  NSTEMI due to Stress induced cardiomyopathy- Cath revealed mild CAD dx, 110-30%, with hypokinesis- akinesis involving the ant-lat wall , apex and distal inferior wall, likely Takotsubo cardiomyopathy. Echo consistent with diagnosis but with  EF- 35-40%. - Cont metop to 12.5 BID, BP low 110s now, uptitrate as BP and pulse tolerates, will help reduce freq PVcs.  - Lipid panel- LdL- 81. - Cont Aspirin 81 - On benazepril and HCTZ -20-12.'5mg'$  daily at home for BP, cont to hold for  now, with CKD and contrast exposure. Possibly resume tomorrow. - Aspirin '81mg'$  daily - Transfer out of step down to tele- order placed.  COPD exacerbation- Likely precipitated by viral infection, presently on nebulizers- Albuterol. -  Cont Symbicort- Budesonide and formoterol BID - Considering significant improvement will not start Steroids - Switch Albuterol to Q6H - Cont Doxycycline- renal and QTc friendly, complete 5 day course. Day 3 today.   CKD- 3- Cr on admission stable at 1.29, at baseline. - Bmet this am Cr- 1.36 today- 04/18/2015 - Bmet tomorrow and possibly resume home ACE at low dose.  DM- Not on meds. Not on statin. -HGBA1c pending   LOS: 2 days   Angela Roys, MD 04/18/2015, 7:29 AM  PGY-2 IMTS  I have examined the patient and reviewed assessment and plan and discussed with patient.  Agree with above as stated.  Takatsubo cardiomyopathy.  Start ACE-I and beta blocker.  Try to get oxygen requirement down if possible although, she has been using more oxygen at home of late.    Angela Lawson S.

## 2015-04-18 NOTE — Progress Notes (Signed)
Pt c/o chest tightness same as earlier this evening. VS stable. MD paged. New orders received will continue to monitor.

## 2015-04-18 NOTE — Progress Notes (Signed)
Pt transferred to [facility] room 10, tele floor per MD order. NAD VSS, family at bedside.

## 2015-04-19 ENCOUNTER — Encounter (HOSPITAL_COMMUNITY): Payer: Self-pay | Admitting: Nurse Practitioner

## 2015-04-19 DIAGNOSIS — I214 Non-ST elevation (NSTEMI) myocardial infarction: Secondary | ICD-10-CM

## 2015-04-19 DIAGNOSIS — I5181 Takotsubo syndrome: Secondary | ICD-10-CM

## 2015-04-19 DIAGNOSIS — I10 Essential (primary) hypertension: Secondary | ICD-10-CM

## 2015-04-19 DIAGNOSIS — E119 Type 2 diabetes mellitus without complications: Secondary | ICD-10-CM

## 2015-04-19 DIAGNOSIS — I499 Cardiac arrhythmia, unspecified: Secondary | ICD-10-CM

## 2015-04-19 DIAGNOSIS — Z85118 Personal history of other malignant neoplasm of bronchus and lung: Secondary | ICD-10-CM

## 2015-04-19 DIAGNOSIS — E785 Hyperlipidemia, unspecified: Secondary | ICD-10-CM

## 2015-04-19 DIAGNOSIS — I498 Other specified cardiac arrhythmias: Secondary | ICD-10-CM

## 2015-04-19 LAB — BASIC METABOLIC PANEL
Anion gap: 10 (ref 5–15)
BUN: 18 mg/dL (ref 6–20)
CO2: 27 mmol/L (ref 22–32)
Calcium: 9.1 mg/dL (ref 8.9–10.3)
Chloride: 100 mmol/L — ABNORMAL LOW (ref 101–111)
Creatinine, Ser: 1.35 mg/dL — ABNORMAL HIGH (ref 0.44–1.00)
GFR calc Af Amer: 42 mL/min — ABNORMAL LOW (ref >60)
GFR calc non Af Amer: 36 mL/min — ABNORMAL LOW (ref >60)
Glucose, Bld: 131 mg/dL — ABNORMAL HIGH (ref 65–99)
Potassium: 4.6 mmol/L (ref 3.5–5.1)
Sodium: 137 mmol/L (ref 135–145)

## 2015-04-19 MED ORDER — METOPROLOL SUCCINATE ER 25 MG PO TB24
25.0000 mg | ORAL_TABLET | Freq: Every day | ORAL | Status: DC
  Administered 2015-04-19 – 2015-04-20 (×2): 25 mg via ORAL
  Filled 2015-04-19 (×2): qty 1

## 2015-04-19 MED ORDER — ATORVASTATIN CALCIUM 80 MG PO TABS
80.0000 mg | ORAL_TABLET | Freq: Every day | ORAL | Status: DC
  Administered 2015-04-19: 80 mg via ORAL
  Filled 2015-04-19 (×2): qty 1

## 2015-04-19 MED ORDER — BENAZEPRIL HCL 10 MG PO TABS
10.0000 mg | ORAL_TABLET | Freq: Every day | ORAL | Status: DC
  Administered 2015-04-19 – 2015-04-20 (×2): 10 mg via ORAL
  Filled 2015-04-19 (×2): qty 1

## 2015-04-19 NOTE — Progress Notes (Addendum)
Pt has been ambulating in hall, 250 ft, without incident.

## 2015-04-19 NOTE — Care Management Note (Signed)
Case Management Note  Patient Details  Name: Angela Lawson MRN: [id number] Date of Birth: Aug 05, 1934  Subjective/Objective:    Pt admitted on 04/16/15 with SOB, cough, and elevated troponins. PTA, pt resides at home with daughter and granddaughter.  She has home oxygen as needed at home.                  Action/Plan:  Will follow for discharge planning as pt progresses.  May need PT c/s depending on progress with ambulation.   Expected Discharge Date:                  Expected Discharge Plan:  Home/Self Care  In-House Referral:     Discharge planning Services  CM Consult  Post Acute Care Choice:    Choice offered to:     DME Arranged:    DME Agency:     HH Arranged:    [place]:     Status of Service:     Medicare Important Message Given:  Yes Date Medicare IM Given:  04/19/15 Medicare IM give by:  Ellan Lambert, RN, BSN  Date Additional Medicare IM Given:    Additional Medicare Important Message give by:     If discussed at [place] of Stay Meetings, dates discussed:    Additional Comments:  [name], RN 04/19/2015, 4:18 PM [phone]

## 2015-04-19 NOTE — Progress Notes (Signed)
Heart Failure Navigator Consult Note  Presentation: Angela Lawson is a 81 Y O F with PMH of HTN, DM, COPD, CKD3, lung Cancer, presented today with complaints of SOB of one week duration, present initially with exertion, and then became worse yesterday and present at rest, no chest pain with deep breathing. There was associated cough- productive of greenish sputum and wheezing. Pt says the cough got worse after she cut her grass on Friday- 04/13/2015. She denies any chest pain even with exertion or previously in the past but says her chest got a bit tight but this is all typical of her normal COPD exacerbation. She is on 2L of O2 at home which she uses as needed. Breathing got better after she used her inahlers for a while.  She denies palpitations, diaphoresis, or nausea or vomiting. She denies family history of premature heart disease, but says one of her sisters died of a heart attack suddenly when she was 42, was previously undiagnosed with CAD and hd no prior chest pain and another sister with chest pain that has been attributed to anxiety. She takes a daily baby aspirin, but is not on a statin. She does not know what her HgbA1c is. She is a previous smoker- quit about 2007, smoked since she was a teenager, about 5 cigs a day. She denies fever or chills, sick contacts, recent travel, leg pain, redness or swelling. She under went left partial pneumonectomy after she was diagnosed with lung cancer in 2009, she did not have anymore treatments like Radiation or chemotherapy. She has regularly followed up for Ct scans   Past Medical History  Diagnosis Date  . Cancer     left lung/ 2009/ surg only  . Essential hypertension   . Arthritis   . Diverticulitis   . Type II diabetes mellitus   . H/O ventral hernia   . COPD (chronic obstructive pulmonary disease)   . Osteoporosis   . Adenocarcinoma of lung 08/20/2012  . Non-obstructive CAD     a. 04/2015 NSTEMI/Cath: LAD 10p, LCX 51m RCA 354m20d, EF 35-40  w/ apical ballooning.  . Takotsubo cardiomyopathy     a. 04/2015 Echo: EF 45050%, mid-dist anterior/apical/inferoapical HK w/ hyperdynamic base. Gr 1 DD, mild AI, mild-mod MR, triv TR, PASP 4813m;  b. 04/2015 LV gram: Ef 35-40% w/ apical ballooning.  . DMarland Kitchenslipidemia   . Ventricular bigeminy     a. 04/2015 in setting of NSTEMI/Takotsubo.    History   Social History  . Marital Status: Widowed    Spouse Name: N/A  . Number of Children: N/A  . Years of Education: N/A   Social History Main Topics  . 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 Alcohol Use: No  . Drug Use: No  . Sexual Activity: Not Currently    Birth Control/ Protection: Surgical   Other Topics Concern  . None   Social History Narrative    ECHO:Study Conclusions--04/17/15  - Left ventricle: The cavity size was normal. There was moderate concentric hypertrophy. Systolic function was mildly reduced. The estimated ejection fraction was in the range of 45% to 50%. There is mid to distal anterior, apical and inferoapical hypokinesis with a hyperdynamic base. Doppler parameters are consistent with abnormal left ventricular relaxation (grade 1 diastolic dysfunction). The E/e&' ratio is >15, suggesting elevated LV filling pressure. - Aortic valve: Poorly visualized. There was mild regurgitation. - Mitral valve: Mildly thickened leaflets . There was mild to moderate  regurgitation. - Left atrium: The atrium was normal in size. - Tricuspid valve: There was trivial regurgitation. - Pulmonary arteries: PA peak pressure: 48 mm Hg (S). - Inferior vena cava: The vessel was normal in size. The respirophasic diameter changes were in the normal range (>= 50%), consistent with normal central venous pressure.  Impressions:  - LVEF 45-50%, mid to distal anterior, apical and inferoapical hypokinesis - consider LAD territory ischemia/infarct versus possible Takatsubo cardiomyopathy.  Mild AI, mild to moderate MR, trivial TR, RVSP 48 mmHg, diastolic dysfunction with elevated LV filling pressure.  Transthoracic echocardiography. M-mode, complete 2D, spectral Doppler, and color Doppler. Birthdate: Patient birthdate: 1934/05/07. Age: Patient is 81 yr old. Sex: Gender: female. BMI: 30.7 kg/m^2. Blood pressure:   133/57 Patient status: Inpatient. Study date: Study date: 04/17/2015. Study time: 10:22 AM. Location: ICU/CCU  BNP    Component Value Date/Time   BNP 76.0 04/16/2015 0525    ProBNP No results found for: PROBNP   Education Assessment and Provision:  Detailed education and instructions provided on heart failure disease management including the following:  Signs and symptoms of Heart Failure When to call the physician Importance of daily weights Low sodium diet Fluid restriction Medication management Anticipated future follow-up appointments  Patient education given on each of the above topics.  Patient acknowledges understanding and acceptance of all instructions.  I spoke at length with Angela Lawson and her daughter regarding her HF.  She tells me that she has not had any previous education and seems unaware of this diagnosis. I emphasized the importance of daily weights and how they relate to signs and symptoms of HF.  We disussed a low sodium diet and high sodium foods to avoid.  She tells me that she has no issues with getting or taking medications.       Education Materials:  "Living Better With Heart Failure" Booklet, Daily Weight Tracker Tool    High Risk Criteria for Readmission and/or Poor Patient Outcomes:   EF <30%- No 45-50% with grade 1 dais dys.  2 or more admissions in 6 months- No  Difficult social situation- No-lives with daughter- however she works and pt stays alone some  Demonstrates medication noncompliance- No    Barriers of Care:  Knowledge and compliance  Discharge Planning:   Plans to discharge to home  with daughter.  Could benefit from [facility] for symptom recognition.

## 2015-04-19 NOTE — Progress Notes (Signed)
Pt c/o of 7/10 CP; admin Nitro 0.'4mg'$  SL.  Pain reduced to 5/10 CP;  Admin 2nd Nitro 0.'4mg'$  SL.  Pain reduced to 2/10 CP.  VSS.  Unconfirmed EKG resulted NSR with PACs.  Dr. Irish Lack has been notified, and is aware.  Pt now resting in bed, in no acute distress.

## 2015-04-19 NOTE — Progress Notes (Signed)
Patient Name: Angela Lawson Date of Encounter: 04/19/2015   Principal Problem:   Takotsubo cardiomyopathy Active Problems:   NSTEMI (non-ST elevated myocardial infarction)   CKD (chronic kidney disease) stage 3, GFR 30-59 ml/min   Essential hypertension   Type II diabetes mellitus   COPD exacerbation   Abnormal EKG   Dyslipidemia   History of lung cancer   Ventricular bigeminy    SUBJECTIVE  Had an episode of c/p earlier this AM - relieved w/ 2 sl NTG.  ECG w/o acute changes.  CURRENT MEDS . albuterol  5 mg Nebulization Q6H  . antiseptic oral rinse  7 mL Mouth Rinse BID  . aspirin EC  81 mg Oral Daily  . budesonide-formoterol  2 puff Inhalation BID  . doxycycline  100 mg Oral Q12H  . heparin  5,000 Units Subcutaneous 3 times per day  . metoprolol tartrate  12.5 mg Oral BID  . pantoprazole  40 mg Oral Daily  . sodium chloride  3 mL Intravenous Q12H    OBJECTIVE  Filed Vitals:   04/19/15 0211 04/19/15 0642 04/19/15 0922 04/19/15 0951  BP:  134/55  137/64  Pulse:  64  79  Temp:  98.5 F (36.9 C)  98.1 F (36.7 C)  TempSrc:  Oral  Oral  Resp:  18  18  Height:      Weight:  157 lb (71.215 kg)    SpO2: 95% 98% 98% 96%    Intake/Output Summary (Last 24 hours) at 04/19/15 1112 Last data filed at 04/19/15 1007  Gross per 24 hour  Intake   1190 ml  Output    400 ml  Net    790 ml   Filed Weights   04/16/15 0428 04/18/15 1424 04/19/15 0642  Weight: 179 lb (81.194 kg) 175 lb 14.4 oz (79.788 kg) 157 lb (71.215 kg)    PHYSICAL EXAM  General: Pleasant, NAD. Neuro: Alert and oriented X 3. Moves all extremities spontaneously. Psych: Normal affect. HEENT:  Normal  Neck: Supple without bruits or JVD. Lungs:  Resp regular and unlabored, diminished breath sounds bilat.  Now wheezing. Heart: Irreg, irreg, no s3, s4, or murmurs. Abdomen: Soft, non-tender, non-distended, BS + x 4.  Extremities: No clubbing, cyanosis or edema. DP/PT/Radials 2+ and equal bilaterally. R  groin site w/o bleeding/bruit/hematoma.  Accessory Clinical Findings  CBC  Recent Labs  04/17/15 0254 04/18/15 0312  WBC 12.2* 9.5  HGB 11.6* 12.6  HCT 37.1 40.2  MCV 90.3 92.6  PLT 219 209   Basic Metabolic Panel  Recent Labs  04/17/15 0532 04/18/15 0830 04/19/15 0428  NA 137 137 137  K 5.0 4.8 4.6  CL 102 100* 100*  CO2 '26 27 27  '$ GLUCOSE 131* 124* 131*  BUN 19 21* 18  CREATININE 1.29* 1.36* 1.35*  CALCIUM 9.4 9.3 9.1  MG 3.1*  --   --    Cardiac Enzymes  Recent Labs  04/16/15 1730 04/16/15 2235  TROPONINI 1.68* 2.61*   Hemoglobin A1C  Recent Labs  04/17/15 0800  HGBA1C 7.4*   Fasting Lipid Panel  Recent Labs  04/17/15 0800  CHOL 157  HDL 59  LDLCALC 81  TRIG 87  CHOLHDL 2.7   TELE  Rsr, freq pvc's and bigeminy.  ECG  RSR, 79, pvc's, poor r prog, deep inflat twi - no acute changes, prolonged QT.  Radiology/Studies  Dg Chest Port 1 View  04/16/2015   CLINICAL DATA:  Sudden onset shortness of breath. History  of left lung cancer 2009.  EXAM: PORTABLE CHEST - 1 VIEW  COMPARISON:  01/10/2015  FINDINGS: Normal heart size and pulmonary vascularity. Volume loss on the left consistent with partial pneumonectomy. Rib changes consistent with thoracotomy. No focal airspace disease or consolidation in the lungs. No blunting of costophrenic angles. No pneumothorax. Calcification of aorta. No change since prior study.  IMPRESSION: Postoperative changes in the left lung. No evidence of active pulmonary disease.   Electronically Signed   By: Lucienne Capers M.D.   On: 04/16/2015 05:39   ASSESSMENT AND PLAN  1.  NSTEMI/non-obstructive CAD:  In setting of takotsubo CM.  Cath w/ non-obs dzs.  Had nitrate responsive c/p this AM.  Cont asa, bb. Resume ACEI. Add high potency statin in setting of ACS with presence of nonobs dzs.  Can consider long acting nitrates for recurrent pain.  Cardiac rehab to see.  Ambulate today.  Hopefully ready for d/c in am.  2.   Takotsubo CM:  EF 35-40% by V gram, 40-45% by echo - both with apical ballooning suggestive of Takotsubo CM.  Euvolemic on exam.  Doubt wt accurate - minus 1 L for admission and wt supposedly down 22 lbs in the absence of diuresis.  Cont BB and consolidate to toprol xl. Add acei today given stability of renal fxn.  Was on benazepril 20 @ home previously.  Plan to f/u echo in 40 days.  Consider adding spiro as outpt if renal fxn stable.  3.  Ventricular Bigeminy:  Cont bb.  4.  CKD III:  Stable.  Resuming ACEI today.  5.  Essential HTN:  BP's in 130's.  Resuming acei today.  6. Dyslipidemia: LDL 81.  Adding statin in setting of above.  7.  DM II:  A1c 7.4.  On tradjenta @ ome.  Will resume.  8.  AECOPD:  No wheezing.  Diminished breath sounds.  Cont inhalers, doxy (day 4/5).  Signed, Murray Hodgkins NP  I have examined the patient and reviewed assessment and plan and discussed with patient.  Agree with above as stated.  Continue medical therapy for Takatsubo cardiomyopathy.  CP better from this AM.  Possible d/c tomorrow.  Encourage ambulation.    VARANASI,JAYADEEP S.

## 2015-04-20 ENCOUNTER — Other Ambulatory Visit: Payer: Self-pay | Admitting: Physician Assistant

## 2015-04-20 ENCOUNTER — Telehealth: Payer: Self-pay | Admitting: Cardiology

## 2015-04-20 DIAGNOSIS — N189 Chronic kidney disease, unspecified: Secondary | ICD-10-CM

## 2015-04-20 DIAGNOSIS — J441 Chronic obstructive pulmonary disease with (acute) exacerbation: Secondary | ICD-10-CM

## 2015-04-20 LAB — BASIC METABOLIC PANEL
Anion gap: 12 (ref 5–15)
BUN: 18 mg/dL (ref 6–20)
CO2: 26 mmol/L (ref 22–32)
Calcium: 9.3 mg/dL (ref 8.9–10.3)
Chloride: 98 mmol/L — ABNORMAL LOW (ref 101–111)
Creatinine, Ser: 1.42 mg/dL — ABNORMAL HIGH (ref 0.44–1.00)
GFR calc Af Amer: 40 mL/min — ABNORMAL LOW (ref >60)
GFR calc non Af Amer: 34 mL/min — ABNORMAL LOW (ref >60)
Glucose, Bld: 120 mg/dL — ABNORMAL HIGH (ref 65–99)
Potassium: 4.1 mmol/L (ref 3.5–5.1)
Sodium: 136 mmol/L (ref 135–145)

## 2015-04-20 MED ORDER — ATORVASTATIN CALCIUM 80 MG PO TABS: 80.0000 mg | ORAL_TABLET | Freq: Every day | ORAL | Status: DC

## 2015-04-20 MED ORDER — BENAZEPRIL HCL 10 MG PO TABS: 10.0000 mg | ORAL_TABLET | Freq: Every day | ORAL | Status: DC

## 2015-04-20 MED ORDER — NITROGLYCERIN 0.4 MG SL SUBL: 0.4000 mg | SUBLINGUAL_TABLET | SUBLINGUAL | Status: DC | PRN

## 2015-04-20 MED ORDER — METOPROLOL SUCCINATE ER 25 MG PO TB24: 25.0000 mg | ORAL_TABLET | Freq: Every day | ORAL | Status: DC

## 2015-04-20 NOTE — Progress Notes (Signed)
Returned call for pt's daughter, Willy Eddy, at [phone], and left VM.

## 2015-04-20 NOTE — Discharge Summary (Signed)
Discharge Summary   Patient ID: CELE MOTE MRN: [id number], DOB/AGE: 79-06-1934 79 y.o. Admit date: 04/16/2015 D/C date:     04/20/2015  Primary Cardiologist: Dr. Irish Lack  Principal Problem:   Takotsubo cardiomyopathy Active Problems:   NSTEMI (non-ST elevated myocardial infarction)   COPD exacerbation   CKD (chronic kidney disease) stage 3, GFR 30-59 ml/min   Essential hypertension   Type II diabetes mellitus   Dyslipidemia   History of lung cancer   Ventricular bigeminy    Admission Dates: 04/16/15-04/20/15 Discharge Diagnosis: NSTEMI/Takotsubo CM- treated medically.   HPI: Angela Lawson is a 81 y.o. female with a history of HTN, DM, COPD on 2L home 02 PRN, CKD stage 3, lung cancer s/p left partial pneumonectomy (2009) and no prior cardiac history who presented to [facility] on 04/16/15 with complaints of SOB and chest tightness and ruled in for NSTEMI.  She complained of SOB x1 week, chest tightness with deep breathing and cough. She denied any chest pain even with exertion or previously but reported that her chest got a bit tight which was typical of her normal COPD exacerbation. She is on 2L of O2 at home which she uses as needed. She is a previous smoker- quit about 2007.  She had adenocarcinoma of the lung and underwent left partial pneumonectomy in 2009. She did not have radiation or chemotherapy. She has regularly followed up for CT scans.     Hospital Course  1. NSTEMI/non-obstructive CAD: Peak troponin 2.61. In setting of takotsubo CM. Cath w/ non-obs dzs.  -- Cont asa, bb and ACEI. High potency statin added in the setting of ACS with presence of nonobs dzs.  -- No further chest pain. She feels ready for discharge.   2. Takotsubo CM: EF 35-40% by V gram, 40-45% by echo - both with apical ballooning suggestive of Takotsubo CM.  -- She denies any recent emotional/physical stressors except for some COPD exacerbations.  -- Euvolemic on exam. -- Cont Toprol '25mg'$  xl.  Acei added back 04/19/15 given stability of renal fxn. (she was on benazepril 20 @ home previously.) We placed her on benazepril '10mg'$  -- Plan to f/u echo in 40 days. Consider adding spiro as outpt if renal fxn stable.  3. Ventricular Bigeminy: Frequent ventricular ectopy on tele. Cont bb.  4. CKD III: Creat with a small rise after adding ACEi yesterday (1.35--> 1.42), but stable. BMET at follow up next week.   5. Essential HTN: Well controlled on Toprol XL '25mg'$  qd and benazepril '10mg'$  qd   6. Dyslipidemia: LDL 81. Statin added in setting of above.  7. DM II: A1c 7.4. On tradjenta @ home. Follow with PCP  8. AECOPD:Cont inhalers. She will finish day 5/5 of doxycycline today.   The patient has had an uncomplicated hospital course and is recovering well. The femoral catheter site is stable. She has been seen by Dr. Irish Lack today and deemed ready for discharge home. All follow-up appointments have been scheduled. Discharge medications are listed below.   Outstanding studies/labs: 2D ECHO 40 days ( 3rd week of June) and BMET at follow up next week.   Discharge Vitals: Blood pressure 116/55, pulse 74, temperature 98 F (36.7 C), temperature source Oral, resp. rate 20, height '5\' 4"'$  (1.626 m), weight 172 lb 6.4 oz (78.2 kg), SpO2 98 %.  Labs: Lab Results  Component Value Date   WBC 9.5 04/18/2015   HGB 12.6 04/18/2015   HCT 40.2 04/18/2015   MCV 92.6 04/18/2015  PLT 199 04/18/2015    Recent Labs Lab 04/16/15 0525  04/20/15 0339  NA 139  < > 136  K 4.1  < > 4.1  CL 103  < > 98*  CO2 29  < > 26  BUN 14  < > 18  CREATININE 1.36*  < > 1.42*  CALCIUM 9.1  < > 9.3  PROT 6.9  --   --   BILITOT 0.3  --   --   ALKPHOS 49  --   --   ALT 20  --   --   AST 25  --   --   GLUCOSE 190*  < > 120*  < > = values in this interval not displayed. No results for input(s): CKTOTAL, CKMB, TROPONINI in the last 72 hours. Lab Results  Component Value Date   CHOL 157 04/17/2015    HDL 59 04/17/2015   LDLCALC 81 04/17/2015   TRIG 87 04/17/2015     Diagnostic Studies/Procedures   Dg Chest Port 1 View  04/16/2015   CLINICAL DATA:  Sudden onset shortness of breath. History of left lung cancer 2009.  EXAM: PORTABLE CHEST - 1 VIEW  COMPARISON:  01/10/2015  FINDINGS: Normal heart size and pulmonary vascularity. Volume loss on the left consistent with partial pneumonectomy. Rib changes consistent with thoracotomy. No focal airspace disease or consolidation in the lungs. No blunting of costophrenic angles. No pneumothorax. Calcification of aorta. No change since prior study.  IMPRESSION: Postoperative changes in the left lung. No evidence of active pulmonary disease.   Electronically Signed   By: Lucienne Capers M.D.   On: 04/16/2015 05:39     04/17/15 [facility] Conclusion      Prox LAD lesion, 10% stenosed.  Mid Cx lesion, 20% stenosed.  Mid RCA lesion, 30% stenosed.  Dist RCA lesion, 20% stenosed.   Moderately severe left ventricular dysfunction with severe hypo-to akinesis involving the mid distal anterolateral wall apex and distal inferior wall in a pattern suggestive of possible Takotsubu cardiomyopathy.  Global ejection fraction is 35-40%.  There is vigorous contractility of the basal walls.  Mild nonobstructive CAD with smooth 10% proximal LAD stenosis, 20% proximal circumflex stenosis, and 20-30% distal RCA stenoses.  RECOMMENDATION: Titration of medical therapy for the patient's cardiomyopathy.  The patient has evolved T-wave abnormalities anterolaterally.  Although the pattern is suggestive of Takotsubu cardiomyopathy, possible transient LAD vasospasm cannot be excluded.      Coronary Findings     Dominance: Co-dominant    Left Anterior Descending   . Prox LAD lesion, 10% stenosed. discrete .      Left Circumflex   . Mid Cx lesion, 20% stenosed. discrete .      Right Coronary Artery   . Mid RCA lesion, 30% stenosed. diffuse .   Marland Kitchen Dist RCA lesion, 20%  stenosed. diffuse .       Wall Motion     Ventriculogram 2                  Left Heart     Left Ventricle There is moderate left ventricular systolic dysfunction. The ejection fraction could not be assess due to 35-40%. There are wall motion abnormalities in the left ventricle. There is apical ballooning consistent with takotsubo cardiomyopathy.     Coronary Diagrams     Diagnostic Diagram             2D ECHO Study Date: 04/17/2015 LV EF: 45% -   50% Study  Conclusions - Left ventricle: The cavity size was normal. There was moderate   concentric hypertrophy. Systolic function was mildly reduced. The   estimated ejection fraction was in the range of 45% to 50%. There   is mid to distal anterior, apical and inferoapical hypokinesis   with a hyperdynamic base. Doppler parameters are consistent with   abnormal left ventricular relaxation (grade 1 diastolic   dysfunction). The E/e&' ratio is >15, suggesting elevated LV   filling pressure. - Aortic valve: Poorly visualized. There was mild regurgitation. - Mitral valve: Mildly thickened leaflets . There was mild to   moderate regurgitation. - Left atrium: The atrium was normal in size. - Tricuspid valve: There was trivial regurgitation. - Pulmonary arteries: PA peak pressure: 48 mm Hg (S). - Inferior vena cava: The vessel was normal in size. The   respirophasic diameter changes were in the normal range (>= 50%),   consistent with normal central venous pressure. Impressions: - LVEF 45-50%, mid to distal anterior, apical and inferoapical   hypokinesis - consider LAD territory ischemia/infarct versus   possible Takatsubo cardiomyopathy. Mild AI, mild to moderate MR,   trivial TR, RVSP 48 mmHg, diastolic dysfunction with elevated LV   filling pressure.   Discharge Medications     Medication List    STOP taking these medications        acyclovir 400 MG tablet  Commonly known as:  ZOVIRAX      benazepril-hydrochlorthiazide 20-12.5 MG per tablet  Commonly known as:  LOTENSIN HCT     clotrimazole-betamethasone cream  Commonly known as:  LOTRISONE     HYDROcodone-acetaminophen 5-325 MG per tablet  Commonly known as:  NORCO/VICODIN     metroNIDAZOLE 500 MG tablet  Commonly known as:  FLAGYL     nystatin 100000 UNIT/GM Powd      TAKE these medications        alendronate 70 MG tablet  Commonly known as:  FOSAMAX  Take 70 mg by mouth once a week. On Fridays     aspirin EC 81 MG tablet  Take 81 mg by mouth daily.     atorvastatin 80 MG tablet  Commonly known as:  LIPITOR  Take 1 tablet (80 mg total) by mouth daily at 6 PM.     benazepril 10 MG tablet  Commonly known as:  LOTENSIN  Take 1 tablet (10 mg total) by mouth daily.     buPROPion 150 MG 12 hr tablet  Commonly known as:  WELLBUTRIN SR  Take 150 mg by mouth 2 (two) times daily.     CALCIUM 600 + D PO  Take 1 tablet by mouth daily.     denosumab 60 MG/ML Soln injection  Commonly known as:  PROLIA  Inject 60 mg into the skin every 6 (six) months. Administer in upper arm, thigh, or abdomen     fish oil-omega-3 fatty acids 1000 MG capsule  Take 1 g by mouth 2 (two) times daily.     gabapentin 300 MG capsule  Commonly known as:  NEURONTIN  Take 300 mg by mouth daily.     Garlic 619 MG Tabs  Take 300 mg by mouth daily.     ipratropium-albuterol 0.5-2.5 (3) MG/3ML Soln  Commonly known as:  DUONEB  Inhale 3 mLs into the lungs every 4 (four) hours as needed (wheezing/shortness of breath).     linagliptin 5 MG Tabs tablet  Commonly known as:  TRADJENTA  Take 5 mg by mouth daily.  meclizine 25 MG tablet  Commonly known as:  ANTIVERT  Take 1 tablet by mouth 3 (three) times daily.     metoprolol succinate 25 MG 24 hr tablet  Commonly known as:  TOPROL-XL  Take 1 tablet (25 mg total) by mouth daily.     nitroGLYCERIN 0.4 MG SL tablet  Commonly known as:  NITROSTAT  Place 1 tablet (0.4 mg total)  under the tongue every 5 (five) minutes as needed for chest pain.     omeprazole 20 MG capsule  Commonly known as:  PRILOSEC  Take 20 mg by mouth daily.     PROAIR HFA 108 (90 BASE) MCG/ACT inhaler  Generic drug:  albuterol  Inhale 2 puffs into the lungs every 4 (four) hours as needed for wheezing or shortness of breath.     rOPINIRole 0.25 MG tablet  Commonly known as:  REQUIP  Take 0.25 mg by mouth 3 (three) times daily.        Disposition   The patient will be discharged in stable condition to home.  Follow-up Information    Follow up with Murray Hodgkins, NP On 04/27/2015.   Specialties:  Nurse Practitioner, Cardiology, Radiology   Why:  @ 8:30am   Contact information:   [address] [address] [phone]       Follow up with [facility] On 04/27/2015.   Why:  [address]. Please arrive at 8am before your appointment with Ignacia Bayley NP for [facility] information:   [place] [address] [phone]        Duration of Discharge Encounter: Greater than 30 minutes including physician and PA time.  SignedAngelena Form R PA-C 04/20/2015, 11:42 AM    I have examined the patient and reviewed assessment and plan and discussed with patient. Agree with above as stated. Check BMet next week. Slightly increased Cr. Stable from a cardiac standpoint. EF should improve with medical therapy.OK for d/c later today. Tele showed frequent PVCs.  Send home with SL NTG.   Madylin Fairbank S.

## 2015-04-20 NOTE — Telephone Encounter (Signed)
7day TOC fu appt--appt 04-27-15 with Gerald Stabs at [phone]

## 2015-04-20 NOTE — Progress Notes (Signed)
Orders received for pt discharge.  Discharge summary printed and reviewed with pt.  Explained medication regimen, and pt had no further questions at this time.  IV removed and site remains clean, dry, intact.  Telemetry removed.  Pt in stable condition and awaiting transport. 

## 2015-04-20 NOTE — Progress Notes (Signed)
Patient Name: Angela Lawson Date of Encounter: 04/20/2015   Principal Problem:   Takotsubo cardiomyopathy Active Problems:   COPD exacerbation   CKD (chronic kidney disease) stage 3, GFR 30-59 ml/min   Abnormal EKG   Essential hypertension   Type II diabetes mellitus   Dyslipidemia   History of lung cancer   Ventricular bigeminy   NSTEMI (non-ST elevated myocardial infarction)    SUBJECTIVE  Feeling well. Ready to go home.   CURRENT MEDS . albuterol  5 mg Nebulization Q6H  . antiseptic oral rinse  7 mL Mouth Rinse BID  . aspirin EC  81 mg Oral Daily  . atorvastatin  80 mg Oral q1800  . benazepril  10 mg Oral Daily  . budesonide-formoterol  2 puff Inhalation BID  . doxycycline  100 mg Oral Q12H  . heparin  5,000 Units Subcutaneous 3 times per day  . metoprolol succinate  25 mg Oral Daily  . pantoprazole  40 mg Oral Daily  . sodium chloride  3 mL Intravenous Q12H    OBJECTIVE  Filed Vitals:   04/20/15 0217 04/20/15 0557 04/20/15 0846 04/20/15 0929  BP:    116/55  Pulse:    74  Temp:    98 F (36.7 C)  TempSrc:    Oral  Resp:    20  Height:      Weight:  172 lb 6.4 oz (78.2 kg)    SpO2: 98%  98% 98%    Intake/Output Summary (Last 24 hours) at 04/20/15 1016 Last data filed at 04/20/15 0900  Gross per 24 hour  Intake   1440 ml  Output   1851 ml  Net   -411 ml   Filed Weights   04/18/15 1424 04/19/15 0642 04/20/15 0557  Weight: 175 lb 14.4 oz (79.788 kg) 157 lb (71.215 kg) 172 lb 6.4 oz (78.2 kg)    PHYSICAL EXAM  General: Pleasant, NAD. Neuro: Alert and oriented X 3. Moves all extremities spontaneously. Psych: Normal affect. HEENT:  Normal  Neck: Supple without bruits or JVD. Lungs:  Resp regular and unlabored, diminished breath sounds bilat.  Now wheezing. Heart: Irreg, irreg, no s3, s4, or murmurs. Abdomen: Soft, non-tender, non-distended, BS + x 4.  Extremities: No clubbing, cyanosis or edema. DP/PT/Radials 2+ and equal bilaterally. R groin  site w/o bleeding/bruit/hematoma.  Accessory Clinical Findings  CBC  Recent Labs  04/18/15 0312  WBC 9.5  HGB 12.6  HCT 40.2  MCV 92.6  PLT 326   Basic Metabolic Panel  Recent Labs  04/19/15 0428 04/20/15 0339  NA 137 136  K 4.6 4.1  CL 100* 98*  CO2 27 26  GLUCOSE 131* 120*  BUN 18 18  CREATININE 1.35* 1.42*  CALCIUM 9.1 9.3    TELE  Rsr, freq pvc's and bigeminy.  ECG  RSR, 79, pvc's, poor r prog, deep inflat twi - no acute changes, prolonged QT.  Radiology/Studies  Dg Chest Port 1 View  04/16/2015   CLINICAL DATA:  Sudden onset shortness of breath. History of left lung cancer 2009.  EXAM: PORTABLE CHEST - 1 VIEW  COMPARISON:  01/10/2015  FINDINGS: Normal heart size and pulmonary vascularity. Volume loss on the left consistent with partial pneumonectomy. Rib changes consistent with thoracotomy. No focal airspace disease or consolidation in the lungs. No blunting of costophrenic angles. No pneumothorax. Calcification of aorta. No change since prior study.  IMPRESSION: Postoperative changes in the left lung. No evidence of active pulmonary disease.  Electronically Signed   By: Lucienne Capers M.D.   On: 04/16/2015 05:39   ASSESSMENT AND PLAN  1.  NSTEMI/non-obstructive CAD:  In setting of takotsubo CM.  Cath w/ non-obs dzs.   -- Cont asa, bb. Resume ACEI. Add high potency statin in setting of ACS with presence of nonobs dzs.   -- No further chest pain. She feels ready for discharge.   2.  Takotsubo CM:  EF 35-40% by V gram, 40-45% by echo - both with apical ballooning suggestive of Takotsubo CM.  Euvolemic on exam.  Doubt wt accurate - minus 1 L for admission and wt supposedly down 22 lbs in the absence of diuresis.  Cont Toprol xl. Acei added back yesterday given stability of renal fxn.  Was on benazepril 20 @ home previously.  Plan to f/u echo in 40 days.  Consider adding spiro as outpt if renal fxn stable.  3.  Ventricular Bigeminy:  Cont bb.  4.  CKD III:   Creat with a small rise but stable.  ACE resumed yesterday.   5.  Essential HTN:  Well controlled on Toprol XL '25mg'$  qd and benazepril '10mg'$  qd   6. Dyslipidemia: LDL 81.  Statin added in setting of above.  7.  DM II:  A1c 7.4.  On tradjenta @ home.  Will resume. Follow with PCP  8.  AECOPD:  No wheezing.  Diminished breath sounds.  Cont inhalers, doxy (day 4/5).  Signed, THOMPSON, KATHRYN R PA-C   I have examined the patient and reviewed assessment and plan and discussed with patient.  Agree with above as stated.  Check BMet next week.  Slightly increased Cr. Stable from a cardiac standpoint.  EF should improve with medical therapy.OK for d/c later today. Tele showed frequent PVCs.  Paras Kreider S.

## 2015-04-23 NOTE — Telephone Encounter (Signed)
Patient contacted regarding discharge from [facility] on May 13,2016.  Patient understands to follow up with provider Ignacia Bayley, NP on Friday May 20,2016 at 8:30AM at [facility]. Patient understands discharge instructions? yes Patient understands medications and regiment? yes Patient understands to bring all medications to this visit? yes

## 2015-04-23 NOTE — Telephone Encounter (Signed)
Pt states she is "doing good" since discharge from hospital 04/20/15.

## 2015-04-25 DIAGNOSIS — J449 Chronic obstructive pulmonary disease, unspecified: Secondary | ICD-10-CM | POA: Diagnosis not present

## 2015-04-27 ENCOUNTER — Encounter: Payer: Self-pay | Admitting: Nurse Practitioner

## 2015-04-27 ENCOUNTER — Ambulatory Visit (INDEPENDENT_AMBULATORY_CARE_PROVIDER_SITE_OTHER): Payer: Medicare Other | Admitting: Nurse Practitioner

## 2015-04-27 VITALS — BP 102/50 | HR 67 | Ht 64.0 in | Wt 174.1 lb

## 2015-04-27 DIAGNOSIS — I5181 Takotsubo syndrome: Secondary | ICD-10-CM

## 2015-04-27 DIAGNOSIS — E119 Type 2 diabetes mellitus without complications: Secondary | ICD-10-CM

## 2015-04-27 DIAGNOSIS — I10 Essential (primary) hypertension: Secondary | ICD-10-CM

## 2015-04-27 DIAGNOSIS — I2510 Atherosclerotic heart disease of native coronary artery without angina pectoris: Secondary | ICD-10-CM

## 2015-04-27 DIAGNOSIS — N183 Chronic kidney disease, stage 3 unspecified: Secondary | ICD-10-CM

## 2015-04-27 DIAGNOSIS — E785 Hyperlipidemia, unspecified: Secondary | ICD-10-CM

## 2015-04-27 LAB — BASIC METABOLIC PANEL
BUN: 18 mg/dL (ref 6–23)
CO2: 27 mEq/L (ref 19–32)
Calcium: 9.7 mg/dL (ref 8.4–10.5)
Chloride: 102 mEq/L (ref 96–112)
Creatinine, Ser: 1.56 mg/dL — ABNORMAL HIGH (ref 0.40–1.20)
GFR: 41.14 mL/min — ABNORMAL LOW (ref >60.00)
Glucose, Bld: 102 mg/dL — ABNORMAL HIGH (ref 70–99)
Potassium: 4.1 mEq/L (ref 3.5–5.1)
Sodium: 135 mEq/L (ref 135–145)

## 2015-04-27 NOTE — Patient Instructions (Signed)
Medication Instructions:  Your physician recommends that you continue on your current medications as directed. Please refer to the Current Medication list given to you today.   Labwork:  BMET TODAY   Testing/Procedures: Your physician has requested that you have an echocardiogram. IN 6 WEEKS Echocardiography is a painless test that uses sound waves to create images of your heart. It provides your doctor with information about the size and shape of your heart and how well your heart's chambers and valves are working. This procedure takes approximately one hour. There are no restrictions for this procedure.    Follow-Up:  WITH DR Irish Lack 6 TO 8 WEEKS    Any Other Special Instructions Will Be Listed Below (If Applicable).

## 2015-04-27 NOTE — Progress Notes (Signed)
Patient Name: Angela Lawson Date of Encounter: 04/27/2015  Primary Care Provider:  Rosita Fire, MD Primary Cardiologist:  Lendell Caprice, MD   Chief Complaint  81 year old female status post recent admission for non-STEMI and takotsubo cardiomyopathy, who presents for follow-up.  Past Medical History   Past Medical History  Diagnosis Date  . Cancer     left lung/ 2009/ surg only  . Essential hypertension   . Arthritis   . Diverticulitis   . Type II diabetes mellitus   . H/O ventral hernia   . COPD (chronic obstructive pulmonary disease)   . Osteoporosis   . Adenocarcinoma of lung 08/20/2012  . Non-obstructive CAD     a. 04/2015 NSTEMI/Cath: LAD 10p, LCX 110m RCA 330m20d, EF 35-40 w/ apical ballooning.  . Takotsubo cardiomyopathy     a. 04/2015 Echo: EF 45-50%, mid-dist anterior/apical/inferoapical HK w/ hyperdynamic base. Gr 1 DD, mild AI, mild-mod MR, triv TR, PASP 4875m;  b. 04/2015 LV gram: Ef 35-40% w/ apical ballooning.  . DMarland Kitchenslipidemia   . Ventricular bigeminy     a. 04/2015 in setting of NSTEMI/Takotsubo.   Past Surgical History  Procedure Laterality Date  . Cholecystectomy    . Ectopic pregnancy surgery    . Abdominal hysterectomy    . Lung cancer surgery    . Incisional hernia repair N/A 08/26/2013    Procedure: HERNIA REPAIR INCISIONAL WITH MESH;  Surgeon: MarJamesetta SoD;  Location: [facility];  Service: General;  Laterality: N/A;  . Colonoscopy N/A 09/18/2014    Procedure: COLONOSCOPY;  Surgeon: SanDanie BinderD;  Location: [facility];  Service: Endoscopy;  Laterality: N/A;  8:30 AM - moved to 10:30 - CRosendo Gros notify pt  . Cardiac catheterization N/A 04/17/2015    Procedure: Left Heart Cath and Coronary Angiography;  Surgeon: ThoTroy SineD;  Location: [facility];  Service: Cardiovascular;  Laterality: N/A;    Allergies  No Known Allergies  HPI  [age]ar old female with the above problem list. [facility]  complaints of chest tightness. She ruled in for a non-STEMI. Echo showed apical ballooning suggestive of takotsubo cardiomyopathy. Catheterization revealed nonobstructive CAD with an EF of 35-40%. Again, evidence of apical ballooning was noted. She was treated with beta blocker, ACE inhibitor, aspirin and statin and subsequently discharged one week ago.  Since her discharge, she has been reasonably well. Her weight has been stable in the 173-174 range. She does have some degree of chronic dyspnea on exertion in the setting of COPD, and this is not significantly changed. She also has chronic 3 pillow orthopnea, which has not changed. She denies PND, dizziness, syncope, edema, or early satiety.  Home Medications  Prior to Admission medications   Medication Sig Start Date End Date Taking? Authorizing Provider  albuterol (PROAIR HFA) 108 (90 BASE) MCG/ACT inhaler Inhale 2 puffs into the lungs every 4 (four) hours as needed for wheezing or shortness of breath.    Historical Provider, MD  alendronate (FOSAMAX) 70 MG tablet Take 70 mg by mouth once a week. On Fridays 03/14/15   Historical Provider, MD  aspirin EC 81 MG tablet Take 81 mg by mouth daily.    Historical Provider, MD  atorvastatin (LIPITOR) 80 MG tablet Take 1 tablet (80 mg total) by mouth daily at 6 PM. 04/20/15   KatEileen StanfordA-C  benazepril (LOTENSIN) 10 MG tablet Take 1 tablet (10 mg total) by mouth daily.  04/20/15   Eileen Stanford, PA-C  buPROPion (WELLBUTRIN SR) 150 MG 12 hr tablet Take 150 mg by mouth 2 (two) times daily.    Historical Provider, MD  Calcium Carbonate-Vitamin D (CALCIUM 600 + D PO) Take 1 tablet by mouth daily.    Historical Provider, MD  denosumab (PROLIA) 60 MG/ML SOLN injection Inject 60 mg into the skin every 6 (six) months. Administer in upper arm, thigh, or abdomen    Historical Provider, MD  fish oil-omega-3 fatty acids 1000 MG capsule Take 1 g by mouth 2 (two) times daily.     Historical Provider, MD    gabapentin (NEURONTIN) 300 MG capsule Take 300 mg by mouth daily.     Historical Provider, MD  Garlic 505 MG TABS Take 300 mg by mouth daily.    Historical Provider, MD  ipratropium-albuterol (DUONEB) 0.5-2.5 (3) MG/3ML SOLN Inhale 3 mLs into the lungs every 4 (four) hours as needed (wheezing/shortness of breath).  09/05/14   Historical Provider, MD  linagliptin (TRADJENTA) 5 MG TABS tablet Take 5 mg by mouth daily.    Historical Provider, MD  meclizine (ANTIVERT) 25 MG tablet Take 1 tablet by mouth 3 (three) times daily. 09/05/14   Historical Provider, MD  metoprolol succinate (TOPROL-XL) 25 MG 24 hr tablet Take 1 tablet (25 mg total) by mouth daily. 04/20/15   Eileen Stanford, PA-C  nitroGLYCERIN (NITROSTAT) 0.4 MG SL tablet Place 1 tablet (0.4 mg total) under the tongue every 5 (five) minutes as needed for chest pain. 04/20/15   Eileen Stanford, PA-C  omeprazole (PRILOSEC) 20 MG capsule Take 20 mg by mouth daily.    Historical Provider, MD  rOPINIRole (REQUIP) 0.25 MG tablet Take 0.25 mg by mouth 3 (three) times daily.  09/05/14   Historical Provider, MD    Review of Systems  As above, she has been stable since discharge with chronic dyspnea on exertion and chronic 3 pillow orthopnea. Neither of which have changed. She denies chest pain, palpitations, PND, dizziness, syncope, edema, or early satiety..  All other systems reviewed and are otherwise negative except as noted above.  Physical Exam  VS:  BP 102/50 mmHg  Pulse 67  Ht '5\' 4"'$  (1.626 m)  Wt 174 lb 1.9 oz (78.98 kg)  BMI 29.87 kg/m2 , BMI Body mass index is 29.87 kg/(m^2). GEN: Well nourished, well developed, in no acute distress. HEENT: normal. Neck: Supple, no JVD, carotid bruits, or masses. Cardiac: RRR, no murmurs, rubs, or gallops. No clubbing, cyanosis, edema.  Radials/DP/PT 2+ and equal bilaterally.  Right groin catheterization site without bleeding, bruit, or hematoma.  Respiratory:  Respirations regular and unlabored,  clear to auscultation bilaterally. GI: Soft, nontender, nondistended, BS + x 4. MS: no deformity or atrophy. Skin: warm and dry, no rash. Neuro:  Strength and sensation are intact. Psych: Normal affect.  Accessory Clinical Findings  ECG - regular sinus rhythm, 67, PVC, somewhat diffuse T-wave inversion involving inferior and anterolateral leads. Delayed R-wave progression. Overall unchanged from prior ECGs.   Assessment & Plan  1.  Non-STEMI, subsequent episode of care/nonobstructive coronary artery disease: Status post recent admission with elevated troponins and chest tightness. She had nonobstructive coronary artery disease on catheterization. She has not had any chest pain. She remains on aspirin, high potency statin, beta blocker, and ACE inhibitor therapy.   2.  Takotsubo Cardiomyopathy: EF was 35-40% by left ventriculography and 45-50% by echocardiogram. She is on beta blocker and ACE inhibitor therapy. She is  euvolemic on exam  and weights have been stable at home.  Plan to repeat echocardiogram in approximately one month (40 days following event) to reevaluate LV function.   3.  Essential HTN:  Stable on beta blocker and ACE inhibitor therapy.   4.  HL:  LDL 81 w/ nl LFT's on 5/9-10. Cont high potency statin in setting of ACS and CAD (nonobs).She will require follow-up lipids and LFTs upon follow-up office visit in approximately 6-8 weeks.   5.  CKD III:  F/u bmet today.  Cont acei.  6.  DM II:  A1c 7.4.  On tradjenta.  She will f/u with primary care for further eval and med adjustment.  7.  COPD: No active wheezing.  She recently completed a 5 day course of doxy. Cont home meds/inhalers.  8.  Dispo:  F/U echo in 1 month to reeval EF.  F/U Dr. Irish Lack following echo.   Murray Hodgkins, NP 04/27/2015, 8:25 AM

## 2015-04-30 ENCOUNTER — Other Ambulatory Visit: Payer: Self-pay | Admitting: *Deleted

## 2015-04-30 ENCOUNTER — Ambulatory Visit (HOSPITAL_COMMUNITY): Payer: Medicare Other

## 2015-04-30 DIAGNOSIS — I959 Hypotension, unspecified: Secondary | ICD-10-CM | POA: Diagnosis not present

## 2015-04-30 DIAGNOSIS — I5181 Takotsubo syndrome: Secondary | ICD-10-CM | POA: Diagnosis not present

## 2015-04-30 DIAGNOSIS — I10 Essential (primary) hypertension: Secondary | ICD-10-CM

## 2015-04-30 DIAGNOSIS — N183 Chronic kidney disease, stage 3 (moderate): Secondary | ICD-10-CM | POA: Diagnosis not present

## 2015-04-30 DIAGNOSIS — I214 Non-ST elevation (NSTEMI) myocardial infarction: Secondary | ICD-10-CM | POA: Diagnosis not present

## 2015-05-01 ENCOUNTER — Other Ambulatory Visit (HOSPITAL_COMMUNITY): Payer: Medicare Other

## 2015-05-04 DIAGNOSIS — N183 Chronic kidney disease, stage 3 (moderate): Secondary | ICD-10-CM | POA: Diagnosis not present

## 2015-05-04 DIAGNOSIS — I517 Cardiomegaly: Secondary | ICD-10-CM | POA: Diagnosis not present

## 2015-05-04 DIAGNOSIS — R42 Dizziness and giddiness: Secondary | ICD-10-CM | POA: Diagnosis not present

## 2015-05-04 DIAGNOSIS — E119 Type 2 diabetes mellitus without complications: Secondary | ICD-10-CM | POA: Diagnosis not present

## 2015-05-14 ENCOUNTER — Ambulatory Visit (HOSPITAL_COMMUNITY)
Admission: RE | Admit: 2015-05-14 | Discharge: 2015-05-14 | Disposition: A | Discharge status: Home / Self Care | Payer: Medicare Other | Source: Ambulatory Visit | Attending: Internal Medicine | Admitting: Internal Medicine

## 2015-05-14 DIAGNOSIS — Z1231 Encounter for screening mammogram for malignant neoplasm of breast: Secondary | ICD-10-CM | POA: Diagnosis not present

## 2015-05-18 DIAGNOSIS — I2510 Atherosclerotic heart disease of native coronary artery without angina pectoris: Secondary | ICD-10-CM | POA: Diagnosis not present

## 2015-05-18 DIAGNOSIS — N390 Urinary tract infection, site not specified: Secondary | ICD-10-CM | POA: Diagnosis not present

## 2015-05-18 DIAGNOSIS — N183 Chronic kidney disease, stage 3 (moderate): Secondary | ICD-10-CM | POA: Diagnosis not present

## 2015-05-18 DIAGNOSIS — J449 Chronic obstructive pulmonary disease, unspecified: Secondary | ICD-10-CM | POA: Diagnosis not present

## 2015-05-18 DIAGNOSIS — E780 Pure hypercholesterolemia: Secondary | ICD-10-CM | POA: Diagnosis not present

## 2015-05-18 DIAGNOSIS — E119 Type 2 diabetes mellitus without complications: Secondary | ICD-10-CM | POA: Diagnosis not present

## 2015-05-18 DIAGNOSIS — Z0000 Encounter for general adult medical examination without abnormal findings: Secondary | ICD-10-CM | POA: Diagnosis not present

## 2015-05-26 DIAGNOSIS — J449 Chronic obstructive pulmonary disease, unspecified: Secondary | ICD-10-CM | POA: Diagnosis not present

## 2015-06-01 ENCOUNTER — Other Ambulatory Visit (HOSPITAL_COMMUNITY): Payer: Medicare Other

## 2015-06-04 ENCOUNTER — Other Ambulatory Visit: Payer: Self-pay

## 2015-06-04 ENCOUNTER — Ambulatory Visit (HOSPITAL_COMMUNITY): Payer: Medicare Other | Attending: Cardiovascular Disease

## 2015-06-04 ENCOUNTER — Other Ambulatory Visit (INDEPENDENT_AMBULATORY_CARE_PROVIDER_SITE_OTHER): Payer: Medicare Other | Admitting: *Deleted

## 2015-06-04 DIAGNOSIS — I351 Nonrheumatic aortic (valve) insufficiency: Secondary | ICD-10-CM | POA: Diagnosis not present

## 2015-06-04 DIAGNOSIS — I10 Essential (primary) hypertension: Secondary | ICD-10-CM

## 2015-06-04 DIAGNOSIS — I5181 Takotsubo syndrome: Secondary | ICD-10-CM

## 2015-06-04 DIAGNOSIS — I313 Pericardial effusion (noninflammatory): Secondary | ICD-10-CM | POA: Insufficient documentation

## 2015-06-04 LAB — BASIC METABOLIC PANEL
BUN: 17 mg/dL (ref 6–23)
CO2: 28 mEq/L (ref 19–32)
Calcium: 9.1 mg/dL (ref 8.4–10.5)
Chloride: 104 mEq/L (ref 96–112)
Creatinine, Ser: 1.39 mg/dL — ABNORMAL HIGH (ref 0.40–1.20)
GFR: 46.99 mL/min — ABNORMAL LOW (ref >60.00)
Glucose, Bld: 120 mg/dL — ABNORMAL HIGH (ref 70–99)
Potassium: 4.2 mEq/L (ref 3.5–5.1)
Sodium: 135 mEq/L (ref 135–145)

## 2015-06-25 DIAGNOSIS — J449 Chronic obstructive pulmonary disease, unspecified: Secondary | ICD-10-CM | POA: Diagnosis not present

## 2015-06-26 ENCOUNTER — Other Ambulatory Visit: Payer: Self-pay

## 2015-06-26 MED ORDER — METOPROLOL SUCCINATE ER 25 MG PO TB24: 25.0000 mg | ORAL_TABLET | Freq: Every day | ORAL | Status: AC

## 2015-06-26 MED ORDER — ATORVASTATIN CALCIUM 80 MG PO TABS: 80.0000 mg | ORAL_TABLET | Freq: Every day | ORAL | Status: DC

## 2015-07-03 ENCOUNTER — Encounter: Payer: Self-pay | Admitting: Interventional Cardiology

## 2015-07-03 ENCOUNTER — Ambulatory Visit (INDEPENDENT_AMBULATORY_CARE_PROVIDER_SITE_OTHER): Payer: Medicare Other | Admitting: Interventional Cardiology

## 2015-07-03 ENCOUNTER — Other Ambulatory Visit: Payer: Self-pay

## 2015-07-03 VITALS — BP 128/60 | HR 76 | Ht 64.0 in | Wt 175.0 lb

## 2015-07-03 DIAGNOSIS — I5181 Takotsubo syndrome: Secondary | ICD-10-CM

## 2015-07-03 DIAGNOSIS — J441 Chronic obstructive pulmonary disease with (acute) exacerbation: Secondary | ICD-10-CM

## 2015-07-03 DIAGNOSIS — Z85118 Personal history of other malignant neoplasm of bronchus and lung: Secondary | ICD-10-CM | POA: Diagnosis not present

## 2015-07-03 DIAGNOSIS — E785 Hyperlipidemia, unspecified: Secondary | ICD-10-CM

## 2015-07-03 NOTE — Progress Notes (Signed)
Patient ID: Angela Lawson, female   DOB: Apr 13, 1934, 81 y.o.   MRN: [id number]     Cardiology Office Note   Date:  07/03/2015   ID:  Angela Lawson, DOB 27-Jul-1934, MRN [id number]  PCP:  Rosita Fire, MD    No chief complaint on file.  follow-up cardiomyopathy   Wt Readings from Last 3 Encounters:  07/03/15 175 lb (79.379 kg)  04/27/15 174 lb 1.9 oz (78.98 kg)  04/20/15 172 lb 6.4 oz (78.2 kg)       History of Present Illness: Angela Lawson is a 81 y.o. female  Who had an episode of stress-induced cardiomyopathy in May 2016. She underwent cardiac catheterization at that time showing no significant coronary artery disease. She had a repeat echocardiogram in June 2016 showing normalization of her left ventricular ejection fraction. Overall, she is feeling well. She is using oxygen more frequently. She had been on oxygen before her cardiac issues due to COPD. She denies any swelling. She sleeps on 2-3 pillows a day. This is stable for her. No ankle edema.    Past Medical History  Diagnosis Date  . Cancer     left lung/ 2009/ surg only  . Essential hypertension   . Arthritis   . Diverticulitis   . Type II diabetes mellitus   . H/O ventral hernia   . COPD (chronic obstructive pulmonary disease)   . Osteoporosis   . Adenocarcinoma of lung 08/20/2012  . Non-obstructive CAD     a. 04/2015 NSTEMI/Cath: LAD 10p, LCX 75m RCA 341m20d, EF 35-40 w/ apical ballooning.  . Takotsubo cardiomyopathy     a. 04/2015 Echo: EF 45-50%, mid-dist anterior/apical/inferoapical HK w/ hyperdynamic base. Gr 1 DD, mild AI, mild-mod MR, triv TR, PASP 4866m;  b. 04/2015 LV gram: Ef 35-40% w/ apical ballooning.  . DMarland Kitchenslipidemia   . Ventricular bigeminy     a. 04/2015 in setting of NSTEMI/Takotsubo.    Past Surgical History  Procedure Laterality Date  . Cholecystectomy    . Ectopic pregnancy surgery    . Abdominal hysterectomy    . Lung cancer surgery    . Incisional hernia repair N/A 08/26/2013   Procedure: HERNIA REPAIR INCISIONAL WITH MESH;  Surgeon: MarJamesetta SoD;  Location: [facility];  Service: General;  Laterality: N/A;  . Colonoscopy N/A 09/18/2014    Procedure: COLONOSCOPY;  Surgeon: SanDanie BinderD;  Location: [facility];  Service: Endoscopy;  Laterality: N/A;  8:30 AM - moved to 10:30 - CRosendo Gros notify pt  . Cardiac catheterization N/A 04/17/2015    Procedure: Left Heart Cath and Coronary Angiography;  Surgeon: ThoTroy SineD;  Location: [facility];  Service: Cardiovascular;  Laterality: N/A;     Current Outpatient Prescriptions  Medication Sig Dispense Refill  . albuterol (PROAIR HFA) 108 (90 BASE) MCG/ACT inhaler Inhale 2 puffs into the lungs every 4 (four) hours as needed for wheezing or shortness of breath.    . aMarland Kitchenendronate (FOSAMAX) 70 MG tablet Take 70 mg by mouth once a week. On Fridays    . ALPRAZolam (XANAX) 0.25 MG tablet Take 0.25 mg by mouth 2 (two) times daily.  2  . aspirin EC 81 MG tablet Take 81 mg by mouth daily.    . aMarland Kitchenorvastatin (LIPITOR) 80 MG tablet Take 1 tablet (80 mg total) by mouth daily at 6 PM. 90 tablet 3  . buPROPion (WELLBUTRIN SR) 150 MG 12 hr tablet Take 150 mg by  mouth 2 (two) times daily.    . Calcium Carbonate-Vitamin D (CALCIUM 600 + D PO) Take 1 tablet by mouth daily.    Marland Kitchen denosumab (PROLIA) 60 MG/ML SOLN injection Inject 60 mg into the skin every 6 (six) months. Administer in upper arm, thigh, or abdomen    . fish oil-omega-3 fatty acids 1000 MG capsule Take 1 g by mouth 2 (two) times daily.     Marland Kitchen gabapentin (NEURONTIN) 300 MG capsule Take 300 mg by mouth daily.     . Garlic 671 MG TABS Take 300 mg by mouth daily.    Marland Kitchen ipratropium-albuterol (DUONEB) 0.5-2.5 (3) MG/3ML SOLN Inhale 3 mLs into the lungs every 4 (four) hours as needed (wheezing/shortness of breath).     . linagliptin (TRADJENTA) 5 MG TABS tablet Take 5 mg by mouth daily.    Marland Kitchen losartan (COZAAR) 50 MG tablet Take 50 mg by mouth daily.  2  . meclizine  (ANTIVERT) 25 MG tablet Take 1 tablet by mouth 3 (three) times daily.    . metoprolol succinate (TOPROL-XL) 25 MG 24 hr tablet Take 1 tablet (25 mg total) by mouth daily. 90 tablet 3  . nitroGLYCERIN (NITROSTAT) 0.4 MG SL tablet Place 1 tablet (0.4 mg total) under the tongue every 5 (five) minutes as needed for chest pain. 25 tablet 12  . omeprazole (PRILOSEC) 20 MG capsule Take 20 mg by mouth daily.    Marland Kitchen rOPINIRole (REQUIP) 0.25 MG tablet Take 0.25 mg by mouth 3 (three) times daily.      No current facility-administered medications for this visit.    Allergies:   Lisinopril    Social History:  The patient  reports that she has quit smoking. She has never used smokeless tobacco. She reports that she does not drink alcohol or use illicit drugs.   Family History:  The patient's family history includes Asthma in an other family member; Cancer in an other family member; Diabetes in her father and mother; Hypertension in her mother. There is no history of Colon cancer.    ROS:  Please see the history of present illness.   Otherwise, review of systems are positive for increasing oxygen use, brief, episodic palpitations lasting just a few seconds.   All other systems are reviewed and negative.    PHYSICAL EXAM: VS:  BP 128/60 mmHg  Pulse 76  Ht '5\' 4"'$  (1.626 m)  Wt 175 lb (79.379 kg)  BMI 30.02 kg/m2  SpO2 88% , BMI Body mass index is 30.02 kg/(m^2). GEN: Well nourished, well developed, in no acute distress, wearing supplemental oxygen HEENT: normal Neck: no JVD, carotid bruits, or masses Cardiac: RRR; no murmurs, rubs, or gallops,no edema  Respiratory:  clear to auscultation bilaterally, normal work of breathing GI: soft, nontender, nondistended, + BS MS: no deformity or atrophy Skin: warm and dry, no rash Neuro:  Strength and sensation are intact Psych: euthymic mood, full affect      Recent Labs: 04/16/2015: ALT 20; B Natriuretic Peptide 76.0 04/17/2015: Magnesium 3.1* 04/18/2015:  Hemoglobin 12.6; Platelets 199 06/04/2015: BUN 17; Creatinine, Ser 1.39*; Potassium 4.2; Sodium 135   Lipid Panel    Component Value Date/Time   CHOL 157 04/17/2015 0800   TRIG 87 04/17/2015 0800   HDL 59 04/17/2015 0800   CHOLHDL 2.7 04/17/2015 0800   VLDL 17 04/17/2015 0800   LDLCALC 81 04/17/2015 0800     Other studies Reviewed: Additional studies/ records that were reviewed today with results demonstrating: Echocardiogram in  June 2016 showing normal left ventricular ejection fraction.   ASSESSMENT AND PLAN:  1. Stress-induced cardiomyopathy: Left ventricular ejection fraction is normalized. Continue ARB and beta blocker. She does have diastolic dysfunction. Continue salt restriction. 2. Hyperlipidemia: She was placed on high-dose atorvastatin due to presumed non-ST elevation MI. She is diabetic and at higher risk of coronary artery disease. High potency statin is reasonable, although she may not need the full 80 mg daily. Would defer changing dose of her statin to her primary care doctor after they check lipids in August. 3. COPD/ history of lung cancer: Continue supplemental oxygen. She was 89% on room air in the office today. She would like to have a smaller oxygen tank. I would asked that she check with Dr. Luan Pulling regarding options for oxygen that is more portable for her.   Current medicines are reviewed at length with the patient today.  The patient concerns regarding her medicines were addressed.  The following changes have been made:  Follow-up with Dr. Luan Pulling regarding oxygen.  Labs/ tests ordered today include: She will need lipids checked in August 2016 and her atorvastatin dose adjusted.  No orders of the defined types were placed in this encounter.    Recommend 150 minutes/week of aerobic exercise Low fat, low carb, high fiber diet recommended  Disposition:   FU as needed with cardiology office since her LV function is normalized and her cath showed no significant  coronary artery disease.   Teresita Madura., MD  07/03/2015 3:44 PM    [facility] [address], [place], [place]  [address] Phone: [phone]; Fax: [phone]

## 2015-07-03 NOTE — Patient Instructions (Signed)
**Note De-identified Tova Vater Obfuscation** Medication Instructions:  Same  Labwork: None  Testing/Procedures: None  Follow-Up: Your physician recommends that you schedule a follow-up appointment in: as needed      

## 2015-07-08 ENCOUNTER — Emergency Department (HOSPITAL_COMMUNITY): Payer: Medicare Other

## 2015-07-08 ENCOUNTER — Inpatient Hospital Stay (HOSPITAL_COMMUNITY)
Admission: EM | Admit: 2015-07-08 | Discharge: 2015-07-11 | DRG: 191 | Disposition: A | Discharge status: Home / Self Care | Payer: Medicare Other | Attending: Internal Medicine | Admitting: Internal Medicine

## 2015-07-08 ENCOUNTER — Encounter (HOSPITAL_COMMUNITY): Payer: Self-pay | Admitting: *Deleted

## 2015-07-08 DIAGNOSIS — R0989 Other specified symptoms and signs involving the circulatory and respiratory systems: Secondary | ICD-10-CM | POA: Diagnosis not present

## 2015-07-08 DIAGNOSIS — I429 Cardiomyopathy, unspecified: Secondary | ICD-10-CM | POA: Diagnosis not present

## 2015-07-08 DIAGNOSIS — Z833 Family history of diabetes mellitus: Secondary | ICD-10-CM | POA: Diagnosis not present

## 2015-07-08 DIAGNOSIS — I252 Old myocardial infarction: Secondary | ICD-10-CM | POA: Diagnosis not present

## 2015-07-08 DIAGNOSIS — I10 Essential (primary) hypertension: Secondary | ICD-10-CM | POA: Diagnosis present

## 2015-07-08 DIAGNOSIS — J441 Chronic obstructive pulmonary disease with (acute) exacerbation: Principal | ICD-10-CM | POA: Diagnosis present

## 2015-07-08 DIAGNOSIS — Z9981 Dependence on supplemental oxygen: Secondary | ICD-10-CM | POA: Diagnosis not present

## 2015-07-08 DIAGNOSIS — I509 Heart failure, unspecified: Secondary | ICD-10-CM | POA: Diagnosis present

## 2015-07-08 DIAGNOSIS — Z8249 Family history of ischemic heart disease and other diseases of the circulatory system: Secondary | ICD-10-CM

## 2015-07-08 DIAGNOSIS — Z87891 Personal history of nicotine dependence: Secondary | ICD-10-CM | POA: Diagnosis not present

## 2015-07-08 DIAGNOSIS — M810 Age-related osteoporosis without current pathological fracture: Secondary | ICD-10-CM | POA: Diagnosis not present

## 2015-07-08 DIAGNOSIS — E785 Hyperlipidemia, unspecified: Secondary | ICD-10-CM | POA: Diagnosis not present

## 2015-07-08 DIAGNOSIS — Z85118 Personal history of other malignant neoplasm of bronchus and lung: Secondary | ICD-10-CM | POA: Diagnosis not present

## 2015-07-08 DIAGNOSIS — I5032 Chronic diastolic (congestive) heart failure: Secondary | ICD-10-CM | POA: Diagnosis present

## 2015-07-08 DIAGNOSIS — M1990 Unspecified osteoarthritis, unspecified site: Secondary | ICD-10-CM | POA: Diagnosis not present

## 2015-07-08 DIAGNOSIS — R0902 Hypoxemia: Secondary | ICD-10-CM | POA: Diagnosis present

## 2015-07-08 DIAGNOSIS — Z7982 Long term (current) use of aspirin: Secondary | ICD-10-CM | POA: Diagnosis not present

## 2015-07-08 DIAGNOSIS — E119 Type 2 diabetes mellitus without complications: Secondary | ICD-10-CM | POA: Diagnosis present

## 2015-07-08 DIAGNOSIS — R0602 Shortness of breath: Secondary | ICD-10-CM | POA: Diagnosis not present

## 2015-07-08 DIAGNOSIS — Z825 Family history of asthma and other chronic lower respiratory diseases: Secondary | ICD-10-CM

## 2015-07-08 LAB — CBC WITH DIFFERENTIAL/PLATELET
Basophils Absolute: 0 10*3/uL (ref 0.0–0.1)
Basophils Relative: 0 % (ref 0–1)
Eosinophils Absolute: 0.7 10*3/uL (ref 0.0–0.7)
Eosinophils Relative: 7 % — ABNORMAL HIGH (ref 0–5)
HCT: 40.7 % (ref 36.0–46.0)
Hemoglobin: 12.7 g/dL (ref 12.0–15.0)
Lymphocytes Relative: 25 % (ref 12–46)
Lymphs Abs: 2.4 10*3/uL (ref 0.7–4.0)
MCH: 28.7 pg (ref 26.0–34.0)
MCHC: 31.2 g/dL (ref 30.0–36.0)
MCV: 91.9 fL (ref 78.0–100.0)
Monocytes Absolute: 1 10*3/uL (ref 0.1–1.0)
Monocytes Relative: 10 % (ref 3–12)
Neutro Abs: 5.5 10*3/uL (ref 1.7–7.7)
Neutrophils Relative %: 58 % (ref 43–77)
Platelets: 230 10*3/uL (ref 150–400)
RBC: 4.43 MIL/uL (ref 3.87–5.11)
RDW: 16 % — ABNORMAL HIGH (ref 11.5–15.5)
WBC: 9.6 10*3/uL (ref 4.0–10.5)

## 2015-07-08 LAB — BASIC METABOLIC PANEL
Anion gap: 10 (ref 5–15)
BUN: 17 mg/dL (ref 6–20)
CO2: 28 mmol/L (ref 22–32)
Calcium: 9.7 mg/dL (ref 8.9–10.3)
Chloride: 100 mmol/L — ABNORMAL LOW (ref 101–111)
Creatinine, Ser: 1.67 mg/dL — ABNORMAL HIGH (ref 0.44–1.00)
GFR calc Af Amer: 33 mL/min — ABNORMAL LOW (ref >60)
GFR calc non Af Amer: 28 mL/min — ABNORMAL LOW (ref >60)
Glucose, Bld: 145 mg/dL — ABNORMAL HIGH (ref 65–99)
Potassium: 4.5 mmol/L (ref 3.5–5.1)
Sodium: 138 mmol/L (ref 135–145)

## 2015-07-08 LAB — BRAIN NATRIURETIC PEPTIDE: B Natriuretic Peptide: 392 pg/mL — ABNORMAL HIGH (ref 0.0–100.0)

## 2015-07-08 LAB — TROPONIN I: Troponin I: <0.03 ng/mL (ref <0.031)

## 2015-07-08 MED ORDER — IPRATROPIUM-ALBUTEROL 0.5-2.5 (3) MG/3ML IN SOLN
3.0000 mL | Freq: Once | RESPIRATORY_TRACT | Status: AC
  Administered 2015-07-08: 3 mL via RESPIRATORY_TRACT
  Filled 2015-07-08: qty 3

## 2015-07-08 MED ORDER — MAGNESIUM SULFATE 2 GM/50ML IV SOLN
2.0000 g | Freq: Once | INTRAVENOUS | Status: AC
  Administered 2015-07-09: 2 g via INTRAVENOUS
  Filled 2015-07-08: qty 50

## 2015-07-08 MED ORDER — IPRATROPIUM-ALBUTEROL 0.5-2.5 (3) MG/3ML IN SOLN
3.0000 mL | Freq: Once | RESPIRATORY_TRACT | Status: AC
  Administered 2015-07-09: 3 mL via RESPIRATORY_TRACT
  Filled 2015-07-08: qty 3

## 2015-07-08 MED ORDER — FUROSEMIDE 10 MG/ML IJ SOLN
20.0000 mg | Freq: Once | INTRAMUSCULAR | Status: AC
  Administered 2015-07-08: 20 mg via INTRAVENOUS
  Filled 2015-07-08: qty 2

## 2015-07-08 MED ORDER — METHYLPREDNISOLONE SODIUM SUCC 125 MG IJ SOLR
125.0000 mg | Freq: Once | INTRAMUSCULAR | Status: AC
  Administered 2015-07-08: 125 mg via INTRAVENOUS
  Filled 2015-07-08: qty 2

## 2015-07-08 NOTE — ED Notes (Signed)
Patient ambulated with 3L O2 to restroom sat 77 percent. Patient had to be assisted back to room with assistance of wheel chair.

## 2015-07-08 NOTE — ED Notes (Signed)
C/o shortness of breath, worse today

## 2015-07-08 NOTE — ED Provider Notes (Signed)
CSN: [id number]     Arrival date & time 07/08/15  2101 History   This chart was scribed for Angela Essex, MD by Randa Evens, ED Scribe. This patient was seen in room [facility]18/[facility]18 and the patient's care was started at 9:15 PM.    Chief Complaint  Patient presents with  . Shortness of Breath    Patient is a 81 y.o. female presenting with shortness of breath. The history is provided by the patient. No language interpreter was used.  Shortness of Breath Associated symptoms: cough   Associated symptoms: no abdominal pain, no chest pain, no fever and no sore throat    HPI Comments: ROBIE OATS is a 81 y.o. female with PMHx listed below who presents to the Emergency Department complaining of worsening SOB onset 1 day prior. Pt states that she does have associated cough. Pt state she is on 2L of oxygen at home. Pt states that she has used the at home nebulizer 4x today. Pt states she has also tried her albuterol inhaler with no relief. Pt states that she has to sleep propped up on 3 pillows in order to get relief at night. Denies fever, CP, abdominal pain, rhinorrhea, sore throat, leg swelling. Pt denies any recent sick contacts. Pt reports Hx of admission for NSTEMI in May 2016.   Past Medical History  Diagnosis Date  . Cancer     left lung/ 2009/ surg only  . Essential hypertension   . Arthritis   . Diverticulitis   . Type II diabetes mellitus   . H/O ventral hernia   . COPD (chronic obstructive pulmonary disease)   . Osteoporosis   . Adenocarcinoma of lung 08/20/2012  . Non-obstructive CAD     a. 04/2015 NSTEMI/Cath: LAD 10p, LCX 2m RCA 361m20d, EF 35-40 w/ apical ballooning.  . Takotsubo cardiomyopathy     a. 04/2015 Echo: EF 45-50%, mid-dist anterior/apical/inferoapical HK w/ hyperdynamic base. Gr 1 DD, mild AI, mild-mod MR, triv TR, PASP 4850m;  b. 04/2015 LV gram: Ef 35-40% w/ apical ballooning.  . DMarland Kitchenslipidemia   . Ventricular bigeminy     a. 04/2015 in setting of  NSTEMI/Takotsubo.   Past Surgical History  Procedure Laterality Date  . Cholecystectomy    . Ectopic pregnancy surgery    . Abdominal hysterectomy    . Lung cancer surgery    . Incisional hernia repair N/A 08/26/2013    Procedure: HERNIA REPAIR INCISIONAL WITH MESH;  Surgeon: MarJamesetta SoD;  Location: [facility];  Service: General;  Laterality: N/A;  . Colonoscopy N/A 09/18/2014    Procedure: COLONOSCOPY;  Surgeon: SanDanie BinderD;  Location: [facility];  Service: Endoscopy;  Laterality: N/A;  8:30 AM - moved to 10:30 - CRosendo Gros notify pt  . Cardiac catheterization N/A 04/17/2015    Procedure: Left Heart Cath and Coronary Angiography;  Surgeon: ThoTroy SineD;  Location: [facility];  Service: Cardiovascular;  Laterality: N/A;   Family History  Problem Relation Age of Onset  . Diabetes Mother   . Hypertension Mother   . Diabetes Father   . Asthma    . Cancer    . Colon cancer Neg Hx    History  Substance Use Topics  . 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 Alcohol Use: No   OB History    No data available      Review of Systems  Constitutional: Negative for fever.  HENT: Negative for rhinorrhea and sore throat.   Respiratory: Positive for cough and shortness of breath.   Cardiovascular: Negative for chest pain and leg swelling.  Gastrointestinal: Negative for abdominal pain.  All other systems reviewed and are negative.    Allergies  Lisinopril  Home Medications   Prior to Admission medications   Medication Sig Start Date End Date Taking? Authorizing Provider  albuterol (PROAIR HFA) 108 (90 BASE) MCG/ACT inhaler Inhale 2 puffs into the lungs every 4 (four) hours as needed for wheezing or shortness of breath.   Yes Historical Provider, MD  aspirin EC 81 MG tablet Take 81 mg by mouth daily.   Yes Historical Provider, MD  Calcium Carbonate-Vitamin D (CALCIUM 600 + D PO) Take 1 tablet by mouth daily.   Yes Historical  Provider, MD  fish oil-omega-3 fatty acids 1000 MG capsule Take 1 g by mouth 2 (two) times daily.    Yes Historical Provider, MD  Garlic 161 MG TABS Take 300 mg by mouth daily.   Yes Historical Provider, MD  alendronate (FOSAMAX) 70 MG tablet Take 70 mg by mouth once a week. On Fridays 03/14/15   Historical Provider, MD  ALPRAZolam Duanne Moron) 0.25 MG tablet Take 0.25 mg by mouth 2 (two) times daily. 04/30/15   Historical Provider, MD  atorvastatin (LIPITOR) 80 MG tablet Take 1 tablet (80 mg total) by mouth daily at 6 PM. 06/26/15   Jettie Booze, MD  buPROPion [facility] SR) 150 MG 12 hr tablet Take 150 mg by mouth 2 (two) times daily.    Historical Provider, MD  denosumab (PROLIA) 60 MG/ML SOLN injection Inject 60 mg into the skin every 6 (six) months. Administer in upper arm, thigh, or abdomen    Historical Provider, MD  gabapentin (NEURONTIN) 300 MG capsule Take 300 mg by mouth daily.     Historical Provider, MD  ipratropium-albuterol (DUONEB) 0.5-2.5 (3) MG/3ML SOLN Inhale 3 mLs into the lungs every 4 (four) hours as needed (wheezing/shortness of breath).  09/05/14   Historical Provider, MD  linagliptin (TRADJENTA) 5 MG TABS tablet Take 5 mg by mouth daily.    Historical Provider, MD  losartan (COZAAR) 50 MG tablet Take 50 mg by mouth daily. 06/29/15   Historical Provider, MD  meclizine (ANTIVERT) 25 MG tablet Take 1 tablet by mouth 3 (three) times daily. 09/05/14   Historical Provider, MD  metoprolol succinate (TOPROL-XL) 25 MG 24 hr tablet Take 1 tablet (25 mg total) by mouth daily. 06/26/15   Jettie Booze, MD  nitroGLYCERIN (NITROSTAT) 0.4 MG SL tablet Place 1 tablet (0.4 mg total) under the tongue every 5 (five) minutes as needed for chest pain. 04/20/15   Eileen Stanford, PA-C  omeprazole (PRILOSEC) 20 MG capsule Take 20 mg by mouth daily.    Historical Provider, MD  rOPINIRole (REQUIP) 0.25 MG tablet Take 0.25 mg by mouth 3 (three) times daily.  09/05/14   Historical Provider, MD   BP  178/76 mmHg  Pulse 92  Temp(Src) 97.8 F (36.6 C)  Resp 20  Ht '5\' 4"'$  (1.626 m)  Wt 174 lb (78.926 kg)  BMI 29.85 kg/m2  SpO2 94%   Physical Exam  Constitutional: She is oriented to person, place, and time. She appears well-developed and well-nourished. No distress.  HENT:  Head: Normocephalic and atraumatic.  Mouth/Throat: Oropharynx is clear and moist. No oropharyngeal exudate.  Eyes: Conjunctivae and EOM are normal. Pupils are equal, round, and reactive to light.  Neck: Normal range  of motion. Neck supple.  No meningismus.  Cardiovascular: Normal rate, regular rhythm, normal heart sounds and intact distal pulses.   No murmur heard. Pulmonary/Chest: She is in respiratory distress. She has decreased breath sounds. She has wheezes.  Mild respiratory distress dyspneic when talking. decreased breath sounds throughout with poor air exchange. Scattered wheezing on right. No peripheral edema.   Abdominal: Soft. There is no tenderness. There is no rebound and no guarding.  Musculoskeletal: Normal range of motion. She exhibits no edema or tenderness.  Neurological: She is alert and oriented to person, place, and time. No cranial nerve deficit. She exhibits normal muscle tone. Coordination normal.  No ataxia on finger to nose bilaterally. No pronator drift. 5/5 strength throughout. CN 2-12 intact. Negative Romberg. Equal grip strength. Sensation intact. Gait is normal.   Skin: Skin is warm.  Psychiatric: She has a normal mood and affect. Her behavior is normal.  Nursing note and vitals reviewed.   ED Course  Procedures (including critical care time) DIAGNOSTIC STUDIES: Oxygen Saturation is 92% on 2L [place], low by my interpretation.    COORDINATION OF CARE: 9:18 PM-Discussed treatment plan with pt at bedside and pt agreed to plan.     Labs Review Labs Reviewed  CBC WITH DIFFERENTIAL/PLATELET - Abnormal; Notable for the following:    RDW 16.0 (*)    Eosinophils Relative 7 (*)    All  other components within normal limits  BASIC METABOLIC PANEL - Abnormal; Notable for the following:    Chloride 100 (*)    Glucose, Bld 145 (*)    Creatinine, Ser 1.67 (*)    GFR calc non Af Amer 28 (*)    GFR calc Af Amer 33 (*)    All other components within normal limits  BRAIN NATRIURETIC PEPTIDE - Abnormal; Notable for the following:    B Natriuretic Peptide 392.0 (*)    All other components within normal limits  TROPONIN I    Imaging Review Dg Chest 2 View  07/08/2015   CLINICAL DATA:  Dyspnea, onset today.  EXAM: CHEST  2 VIEW  COMPARISON:  04/16/2015  FINDINGS: There is moderate hyperinflation. There is stable tenting of the left hemidiaphragm laterally. There is vascular and interstitial congestive change. There is no alveolar edema. There is no effusion.  IMPRESSION: Vascular and interstitial congestive changes without alveolar edema. No effusions. Hyperinflation.   Electronically Signed   By: Andreas Newport M.D.   On: 07/08/2015 22:19     EKG Interpretation   Date/Time:  Sunday July 08 2015 21:31:25 EDT Ventricular Rate:  79 PR Interval:  148 QRS Duration: 84 QT Interval:  415 QTC Calculation: 476 R Axis:   45 Text Interpretation:  Sinus rhythm Anteroseptal infarct, age indeterminate  Minimal ST elevation, inferior leads T waves now upright Confirmed by  Wyvonnia Dusky  MD, Tymothy Cass [phone]) on 07/08/2015 9:36:02 PM      MDM   Final diagnoses:  CHF exacerbation  COPD exacerbation   several day history of shortness of breath worse today. Use for nebulizers without relief. Has not had increase her usual 2 L.  Patient with history of takotsubo cardiomyopathy in May 2016 with recovery of ejection fraction. Clean catheterization. She is not on diuretics.   On exam she is dyspneic with the conversation with decreased breath sounds and scattered wheezing. She is given nebulizers, steroids  X-ray shows mild interstitial edema. IV Lasix given.  Patient has had nebulizers  3, solu-medrol, magnesium. She feels better at rest  but on attempted ambulation she desaturated to 77% on her 3 L. She needs to be brought back to remove the wheelchair and could not walk more than a few feet.  Will admit for CHF/COPD exacerbation. D/w Dr. Darrick Meigs.  I personally performed the services described in this documentation, which was scribed in my presence. The recorded information has been reviewed and is accurate.   Angela Essex, MD 07/09/15 [phone]

## 2015-07-08 NOTE — ED Notes (Addendum)
Patient ambulated around nurses station in ED, sat dropped to 77 percent. Patient respirations are between 23 and 28.  Patient placed back on O2 in room sat on 3l is 93 percent. Patient states that she is not having any pain but can feel her heart beating really fast.

## 2015-07-09 ENCOUNTER — Encounter (HOSPITAL_COMMUNITY): Payer: Self-pay | Admitting: *Deleted

## 2015-07-09 DIAGNOSIS — C3490 Malignant neoplasm of unspecified part of unspecified bronchus or lung: Secondary | ICD-10-CM | POA: Diagnosis not present

## 2015-07-09 DIAGNOSIS — I252 Old myocardial infarction: Secondary | ICD-10-CM | POA: Diagnosis not present

## 2015-07-09 DIAGNOSIS — Z87891 Personal history of nicotine dependence: Secondary | ICD-10-CM | POA: Diagnosis not present

## 2015-07-09 DIAGNOSIS — M810 Age-related osteoporosis without current pathological fracture: Secondary | ICD-10-CM | POA: Diagnosis present

## 2015-07-09 DIAGNOSIS — Z7982 Long term (current) use of aspirin: Secondary | ICD-10-CM | POA: Diagnosis not present

## 2015-07-09 DIAGNOSIS — R0602 Shortness of breath: Secondary | ICD-10-CM | POA: Diagnosis present

## 2015-07-09 DIAGNOSIS — E785 Hyperlipidemia, unspecified: Secondary | ICD-10-CM | POA: Diagnosis present

## 2015-07-09 DIAGNOSIS — J45901 Unspecified asthma with (acute) exacerbation: Secondary | ICD-10-CM | POA: Diagnosis not present

## 2015-07-09 DIAGNOSIS — Z825 Family history of asthma and other chronic lower respiratory diseases: Secondary | ICD-10-CM | POA: Diagnosis not present

## 2015-07-09 DIAGNOSIS — Z833 Family history of diabetes mellitus: Secondary | ICD-10-CM | POA: Diagnosis not present

## 2015-07-09 DIAGNOSIS — I10 Essential (primary) hypertension: Secondary | ICD-10-CM

## 2015-07-09 DIAGNOSIS — M1990 Unspecified osteoarthritis, unspecified site: Secondary | ICD-10-CM | POA: Diagnosis present

## 2015-07-09 DIAGNOSIS — I509 Heart failure, unspecified: Secondary | ICD-10-CM | POA: Insufficient documentation

## 2015-07-09 DIAGNOSIS — I5032 Chronic diastolic (congestive) heart failure: Secondary | ICD-10-CM | POA: Diagnosis present

## 2015-07-09 DIAGNOSIS — J441 Chronic obstructive pulmonary disease with (acute) exacerbation: Secondary | ICD-10-CM | POA: Diagnosis not present

## 2015-07-09 DIAGNOSIS — Z85118 Personal history of other malignant neoplasm of bronchus and lung: Secondary | ICD-10-CM | POA: Diagnosis not present

## 2015-07-09 DIAGNOSIS — Z8249 Family history of ischemic heart disease and other diseases of the circulatory system: Secondary | ICD-10-CM | POA: Diagnosis not present

## 2015-07-09 DIAGNOSIS — J449 Chronic obstructive pulmonary disease, unspecified: Secondary | ICD-10-CM | POA: Diagnosis not present

## 2015-07-09 DIAGNOSIS — I429 Cardiomyopathy, unspecified: Secondary | ICD-10-CM | POA: Diagnosis present

## 2015-07-09 DIAGNOSIS — E119 Type 2 diabetes mellitus without complications: Secondary | ICD-10-CM | POA: Diagnosis present

## 2015-07-09 DIAGNOSIS — Z9981 Dependence on supplemental oxygen: Secondary | ICD-10-CM | POA: Diagnosis not present

## 2015-07-09 DIAGNOSIS — R0902 Hypoxemia: Secondary | ICD-10-CM | POA: Diagnosis present

## 2015-07-09 LAB — CBC
HCT: 40.9 % (ref 36.0–46.0)
Hemoglobin: 12.9 g/dL (ref 12.0–15.0)
MCH: 28.6 pg (ref 26.0–34.0)
MCHC: 31.5 g/dL (ref 30.0–36.0)
MCV: 90.7 fL (ref 78.0–100.0)
Platelets: 231 10*3/uL (ref 150–400)
RBC: 4.51 MIL/uL (ref 3.87–5.11)
RDW: 15.8 % — ABNORMAL HIGH (ref 11.5–15.5)
WBC: 8.6 10*3/uL (ref 4.0–10.5)

## 2015-07-09 LAB — GLUCOSE, CAPILLARY
Glucose-Capillary: 181 mg/dL — ABNORMAL HIGH (ref 65–99)
Glucose-Capillary: 182 mg/dL — ABNORMAL HIGH (ref 65–99)
Glucose-Capillary: 174 mg/dL — ABNORMAL HIGH (ref 65–99)

## 2015-07-09 LAB — COMPREHENSIVE METABOLIC PANEL
ALT: 18 U/L (ref 14–54)
AST: 21 U/L (ref 15–41)
Albumin: 3.8 g/dL (ref 3.5–5.0)
Alkaline Phosphatase: 58 U/L (ref 38–126)
Anion gap: 10 (ref 5–15)
BUN: 18 mg/dL (ref 6–20)
CO2: 27 mmol/L (ref 22–32)
Calcium: 9.4 mg/dL (ref 8.9–10.3)
Chloride: 101 mmol/L (ref 101–111)
Creatinine, Ser: 1.39 mg/dL — ABNORMAL HIGH (ref 0.44–1.00)
GFR calc Af Amer: 41 mL/min — ABNORMAL LOW (ref >60)
GFR calc non Af Amer: 35 mL/min — ABNORMAL LOW (ref >60)
Glucose, Bld: 186 mg/dL — ABNORMAL HIGH (ref 65–99)
Potassium: 4 mmol/L (ref 3.5–5.1)
Sodium: 138 mmol/L (ref 135–145)
Total Bilirubin: 0.5 mg/dL (ref 0.3–1.2)
Total Protein: 7.8 g/dL (ref 6.5–8.1)

## 2015-07-09 MED ORDER — SODIUM CHLORIDE 0.9 % IJ SOLN
3.0000 mL | Freq: Two times a day (BID) | INTRAMUSCULAR | Status: DC
  Administered 2015-07-09 – 2015-07-11 (×6): 3 mL via INTRAVENOUS

## 2015-07-09 MED ORDER — GUAIFENESIN ER 600 MG PO TB12
600.0000 mg | ORAL_TABLET | Freq: Two times a day (BID) | ORAL | Status: DC
  Administered 2015-07-09 – 2015-07-11 (×6): 600 mg via ORAL
  Filled 2015-07-09 (×6): qty 1

## 2015-07-09 MED ORDER — SODIUM CHLORIDE 0.9 % IV SOLN: 250.0000 mL | INTRAVENOUS | Status: DC | PRN

## 2015-07-09 MED ORDER — ENOXAPARIN SODIUM 40 MG/0.4ML ~~LOC~~ SOLN
40.0000 mg | SUBCUTANEOUS | Status: DC
  Administered 2015-07-09 – 2015-07-11 (×3): 40 mg via SUBCUTANEOUS
  Filled 2015-07-09 (×3): qty 0.4

## 2015-07-09 MED ORDER — IPRATROPIUM BROMIDE 0.02 % IN SOLN: 0.5000 mg | Freq: Four times a day (QID) | RESPIRATORY_TRACT | Status: DC

## 2015-07-09 MED ORDER — LOSARTAN POTASSIUM 50 MG PO TABS
50.0000 mg | ORAL_TABLET | Freq: Every day | ORAL | Status: DC
  Administered 2015-07-09 – 2015-07-11 (×3): 50 mg via ORAL
  Filled 2015-07-09 (×3): qty 1

## 2015-07-09 MED ORDER — ONDANSETRON HCL 4 MG/2ML IJ SOLN: 4.0000 mg | Freq: Four times a day (QID) | INTRAMUSCULAR | Status: DC | PRN

## 2015-07-09 MED ORDER — ACETAMINOPHEN 650 MG RE SUPP
650.0000 mg | Freq: Four times a day (QID) | RECTAL | Status: DC | PRN
Start: 2015-07-09 — End: 2015-07-11

## 2015-07-09 MED ORDER — ACETAMINOPHEN 325 MG PO TABS: 650.0000 mg | ORAL_TABLET | Freq: Four times a day (QID) | ORAL | Status: DC | PRN

## 2015-07-09 MED ORDER — ALPRAZOLAM 0.25 MG PO TABS
0.2500 mg | ORAL_TABLET | Freq: Two times a day (BID) | ORAL | Status: DC
  Administered 2015-07-09 – 2015-07-11 (×6): 0.25 mg via ORAL
  Filled 2015-07-09 (×6): qty 1

## 2015-07-09 MED ORDER — ALBUTEROL SULFATE (2.5 MG/3ML) 0.083% IN NEBU
2.5000 mg | INHALATION_SOLUTION | RESPIRATORY_TRACT | Status: DC | PRN
  Administered 2015-07-09: 2.5 mg via RESPIRATORY_TRACT
  Filled 2015-07-09: qty 3

## 2015-07-09 MED ORDER — ROPINIROLE HCL 0.25 MG PO TABS
0.2500 mg | ORAL_TABLET | Freq: Three times a day (TID) | ORAL | Status: DC
  Administered 2015-07-09 – 2015-07-11 (×7): 0.25 mg via ORAL
  Filled 2015-07-09 (×13): qty 1

## 2015-07-09 MED ORDER — MECLIZINE HCL 12.5 MG PO TABS
25.0000 mg | ORAL_TABLET | Freq: Three times a day (TID) | ORAL | Status: DC
  Administered 2015-07-09 – 2015-07-11 (×7): 25 mg via ORAL
  Filled 2015-07-09 (×7): qty 2

## 2015-07-09 MED ORDER — SODIUM CHLORIDE 0.9 % IJ SOLN: 3.0000 mL | INTRAMUSCULAR | Status: DC | PRN

## 2015-07-09 MED ORDER — FUROSEMIDE 20 MG PO TABS
20.0000 mg | ORAL_TABLET | Freq: Every day | ORAL | Status: DC
  Administered 2015-07-09 – 2015-07-11 (×3): 20 mg via ORAL
  Filled 2015-07-09 (×4): qty 1

## 2015-07-09 MED ORDER — METHYLPREDNISOLONE SODIUM SUCC 125 MG IJ SOLR
60.0000 mg | Freq: Four times a day (QID) | INTRAMUSCULAR | Status: DC
  Administered 2015-07-09 – 2015-07-10 (×6): 60 mg via INTRAVENOUS
  Filled 2015-07-09 (×6): qty 2

## 2015-07-09 MED ORDER — ATORVASTATIN CALCIUM 40 MG PO TABS
80.0000 mg | ORAL_TABLET | Freq: Every day | ORAL | Status: DC
  Administered 2015-07-09 – 2015-07-10 (×2): 80 mg via ORAL
  Filled 2015-07-09 (×2): qty 2

## 2015-07-09 MED ORDER — ONDANSETRON HCL 4 MG PO TABS: 4.0000 mg | ORAL_TABLET | Freq: Four times a day (QID) | ORAL | Status: DC | PRN

## 2015-07-09 MED ORDER — ALBUTEROL SULFATE (2.5 MG/3ML) 0.083% IN NEBU: 2.5000 mg | INHALATION_SOLUTION | Freq: Four times a day (QID) | RESPIRATORY_TRACT | Status: DC

## 2015-07-09 MED ORDER — BACITRACIN-NEOMYCIN-POLYMYXIN 400-5-5000 EX OINT
TOPICAL_OINTMENT | Freq: Three times a day (TID) | CUTANEOUS | Status: DC
  Administered 2015-07-09 – 2015-07-11 (×5): 1 via TOPICAL
  Filled 2015-07-09 (×4): qty 1

## 2015-07-09 MED ORDER — INSULIN ASPART 100 UNIT/ML ~~LOC~~ SOLN
0.0000 [IU] | Freq: Three times a day (TID) | SUBCUTANEOUS | Status: DC
  Administered 2015-07-09 (×2): 2 [IU] via SUBCUTANEOUS
  Administered 2015-07-10: 3 [IU] via SUBCUTANEOUS
  Administered 2015-07-10 (×2): 2 [IU] via SUBCUTANEOUS
  Administered 2015-07-11: 3 [IU] via SUBCUTANEOUS
  Administered 2015-07-11: 1 [IU] via SUBCUTANEOUS

## 2015-07-09 MED ORDER — ASPIRIN EC 81 MG PO TBEC
81.0000 mg | DELAYED_RELEASE_TABLET | Freq: Every day | ORAL | Status: DC
  Administered 2015-07-09 – 2015-07-11 (×3): 81 mg via ORAL
  Filled 2015-07-09 (×3): qty 1

## 2015-07-09 MED ORDER — IPRATROPIUM-ALBUTEROL 0.5-2.5 (3) MG/3ML IN SOLN
3.0000 mL | Freq: Four times a day (QID) | RESPIRATORY_TRACT | Status: DC
  Administered 2015-07-09 – 2015-07-11 (×10): 3 mL via RESPIRATORY_TRACT
  Filled 2015-07-09 (×10): qty 3

## 2015-07-09 MED ORDER — DOXYCYCLINE HYCLATE 100 MG IV SOLR
100.0000 mg | Freq: Two times a day (BID) | INTRAVENOUS | Status: DC
  Administered 2015-07-09 – 2015-07-11 (×5): 100 mg via INTRAVENOUS
  Filled 2015-07-09 (×7): qty 100

## 2015-07-09 MED ORDER — METOPROLOL SUCCINATE ER 25 MG PO TB24
25.0000 mg | ORAL_TABLET | Freq: Every day | ORAL | Status: DC
  Administered 2015-07-09 – 2015-07-11 (×3): 25 mg via ORAL
  Filled 2015-07-09 (×3): qty 1

## 2015-07-09 NOTE — Consult Note (Signed)
Consult requested by: Dr. Eligah East Consult requested for COPD exacerbation:  HPI: This is a 81 year old who has COPD at baseline and he came to the emergency department with increasing shortness of breath cough congestion and says she is coughing up green sputum. She has a previous history of lung cancer. She also has cardiomyopathy. She says she feels a little better but is still short of breath and coughing  Past Medical History  Diagnosis Date  . Cancer     left lung/ 2009/ surg only  . Essential hypertension   . Arthritis   . Diverticulitis   . Type II diabetes mellitus   . H/O ventral hernia   . COPD (chronic obstructive pulmonary disease)   . Osteoporosis   . Adenocarcinoma of lung 08/20/2012  . Non-obstructive CAD     a. 04/2015 NSTEMI/Cath: LAD 10p, LCX 101m RCA 324m20d, EF 35-40 w/ apical ballooning.  . Takotsubo cardiomyopathy     a. 04/2015 Echo: EF 45-50%, mid-dist anterior/apical/inferoapical HK w/ hyperdynamic base. Gr 1 DD, mild AI, mild-mod MR, triv TR, PASP 4832m;  b. 04/2015 LV gram: Ef 35-40% w/ apical ballooning.  . DMarland Kitchenslipidemia   . Ventricular bigeminy     a. 04/2015 in setting of NSTEMI/Takotsubo.     Family History  Problem Relation Age of Onset  . Diabetes Mother   . Hypertension Mother   . Diabetes Father   . Asthma    . Cancer    . Colon cancer Neg Hx      History   Social History  . Marital Status: Widowed    Spouse Name: N/A  . Number of Children: N/A  . Years of Education: N/A   Social History Main Topics  . 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 Alcohol Use: No  . Drug Use: No  . Sexual Activity: Not Currently    Birth Control/ Protection: Surgical   Other Topics Concern  . None   Social History Narrative     ROS: She denies any chest pain. No hemoptysis. No fever or chills. The rest per the history and physical    Objective: Vital signs in last 24 hours: Temp:  [97.8 F (36.6 C)-98.6 F (37 C)] 98.6  F (37 C) (08/01 0529) Pulse Rate:  [86-93] 86 (08/01 0529) Resp:  [19-24] 22 (08/01 0050) BP: (133-178)/(57-83) 133/57 mmHg (08/01 0529) SpO2:  [91 %-96 %] 96 % (08/01 0804) Weight:  [78.654 kg (173 lb 6.4 oz)-78.926 kg (174 lb)] 78.654 kg (173 lb 6.4 oz) (08/01 0050) Weight change:  Last BM Date: 07/08/15  Intake/Output from previous day:    PHYSICAL EXAM She is awake and alert. She is in mild distress. Her pupils are reactive, nose and throat are clear. Mucous membranes are moist. Neck is supple without masses her chest shows decreased breath sounds. Heart is regular without gallops. Her abdomen is soft without masses. Extremities showed no edema. Central nervous system exam is grossly intact  Lab Results: Basic Metabolic Panel:  Recent Labs  07/08/15 2113 07/09/15 0522  NA 138 138  K 4.5 4.0  CL 100* 101  CO2 28 27  GLUCOSE 145* 186*  BUN 17 18  CREATININE 1.67* 1.39*  CALCIUM 9.7 9.4   Liver Function Tests:  Recent Labs  07/09/15 0522  AST 21  ALT 18  ALKPHOS 58  BILITOT 0.5  PROT 7.8  ALBUMIN 3.8   No results for input(s): LIPASE, AMYLASE in the last 72  hours. No results for input(s): AMMONIA in the last 72 hours. CBC:  Recent Labs  07/08/15 2113 07/09/15 0522  WBC 9.6 8.6  NEUTROABS 5.5  --   HGB 12.7 12.9  HCT 40.7 40.9  MCV 91.9 90.7  PLT 230 231   Cardiac Enzymes:  Recent Labs  07/08/15 2113  TROPONINI <0.03   BNP: No results for input(s): PROBNP in the last 72 hours. D-Dimer: No results for input(s): DDIMER in the last 72 hours. CBG: No results for input(s): GLUCAP in the last 72 hours. Hemoglobin A1C: No results for input(s): HGBA1C in the last 72 hours. Fasting Lipid Panel: No results for input(s): CHOL, HDL, LDLCALC, TRIG, CHOLHDL, LDLDIRECT in the last 72 hours. Thyroid Function Tests: No results for input(s): TSH, T4TOTAL, FREET4, T3FREE, THYROIDAB in the last 72 hours. Anemia Panel: No results for input(s): VITAMINB12,  FOLATE, FERRITIN, TIBC, IRON, RETICCTPCT in the last 72 hours. Coagulation: No results for input(s): LABPROT, INR in the last 72 hours. Urine Drug Screen: Drugs of Abuse  No results found for: LABOPIA, COCAINSCRNUR, LABBENZ, AMPHETMU, THCU, LABBARB  Alcohol Level: No results for input(s): ETH in the last 72 hours. Urinalysis: No results for input(s): COLORURINE, LABSPEC, PHURINE, GLUCOSEU, HGBUR, BILIRUBINUR, KETONESUR, PROTEINUR, UROBILINOGEN, NITRITE, LEUKOCYTESUR in the last 72 hours.  Invalid input(s): APPERANCEUR Misc. Labs:   ABGS: No results for input(s): PHART, PO2ART, TCO2, HCO3 in the last 72 hours.  Invalid input(s): PCO2   MICROBIOLOGY: No results found for this or any previous visit (from the past 240 hour(s)).  Studies/Results: Dg Chest 2 View  07/08/2015   CLINICAL DATA:  Dyspnea, onset today.  EXAM: CHEST  2 VIEW  COMPARISON:  04/16/2015  FINDINGS: There is moderate hyperinflation. There is stable tenting of the left hemidiaphragm laterally. There is vascular and interstitial congestive change. There is no alveolar edema. There is no effusion.  IMPRESSION: Vascular and interstitial congestive changes without alveolar edema. No effusions. Hyperinflation.   Electronically Signed   By: Andreas Newport M.D.   On: 07/08/2015 22:19    Medications:  Prior to Admission:  Prescriptions prior to admission  Medication Sig Dispense Refill Last Dose  . albuterol (PROAIR HFA) 108 (90 BASE) MCG/ACT inhaler Inhale 2 puffs into the lungs every 4 (four) hours as needed for wheezing or shortness of breath.   07/08/2015  . aspirin EC 81 MG tablet Take 81 mg by mouth daily.   07/08/2015  . Calcium Carbonate-Vitamin D (CALCIUM 600 + D PO) Take 1 tablet by mouth daily.   07/08/2015  . fish oil-omega-3 fatty acids 1000 MG capsule Take 1 g by mouth 2 (two) times daily.    07/08/2015  . Garlic 607 MG TABS Take 300 mg by mouth daily.   07/08/2015  . alendronate (FOSAMAX) 70 MG tablet Take 70  mg by mouth once a week. On Fridays   Taking  . ALPRAZolam (XANAX) 0.25 MG tablet Take 0.25 mg by mouth 2 (two) times daily.  2 Taking  . atorvastatin (LIPITOR) 80 MG tablet Take 1 tablet (80 mg total) by mouth daily at 6 PM. 90 tablet 3 Taking  . buPROPion (WELLBUTRIN SR) 150 MG 12 hr tablet Take 150 mg by mouth 2 (two) times daily.   Taking  . denosumab (PROLIA) 60 MG/ML SOLN injection Inject 60 mg into the skin every 6 (six) months. Administer in upper arm, thigh, or abdomen   Taking  . gabapentin (NEURONTIN) 300 MG capsule Take 300 mg by mouth daily.  Taking  . ipratropium-albuterol (DUONEB) 0.5-2.5 (3) MG/3ML SOLN Inhale 3 mLs into the lungs every 4 (four) hours as needed (wheezing/shortness of breath).    Taking  . linagliptin (TRADJENTA) 5 MG TABS tablet Take 5 mg by mouth daily.   Taking  . losartan (COZAAR) 50 MG tablet Take 50 mg by mouth daily.  2 Taking  . meclizine (ANTIVERT) 25 MG tablet Take 1 tablet by mouth 3 (three) times daily.   Taking  . metoprolol succinate (TOPROL-XL) 25 MG 24 hr tablet Take 1 tablet (25 mg total) by mouth daily. 90 tablet 3 Taking  . nitroGLYCERIN (NITROSTAT) 0.4 MG SL tablet Place 1 tablet (0.4 mg total) under the tongue every 5 (five) minutes as needed for chest pain. 25 tablet 12 Taking  . omeprazole (PRILOSEC) 20 MG capsule Take 20 mg by mouth daily.   Taking  . rOPINIRole (REQUIP) 0.25 MG tablet Take 0.25 mg by mouth 3 (three) times daily.    Taking   Scheduled: . ALPRAZolam  0.25 mg Oral BID  . aspirin EC  81 mg Oral Daily  . atorvastatin  80 mg Oral q1800  . doxycycline (VIBRAMYCIN) IV  100 mg Intravenous Q12H  . enoxaparin (LOVENOX) injection  40 mg Subcutaneous Q24H  . furosemide  20 mg Oral Daily  . guaiFENesin  600 mg Oral BID  . insulin aspart  0-9 Units Subcutaneous TID WC  . ipratropium-albuterol  3 mL Nebulization Q6H  . losartan  50 mg Oral Daily  . meclizine  25 mg Oral TID  . methylPREDNISolone (SOLU-MEDROL) injection  60 mg  Intravenous Q6H  . metoprolol succinate  25 mg Oral Daily  . rOPINIRole  0.25 mg Oral TID  . sodium chloride  3 mL Intravenous Q12H   Continuous:  OVF:IEPPIR chloride, acetaminophen **OR** acetaminophen, albuterol, ondansetron **OR** ondansetron (ZOFRAN) IV, sodium chloride  Assesment: She is admitted with COPD exacerbation. She has multiple other medical problems. She is on appropriate treatment but since she's bringing up purulent sputum I'm going to start her on antibiotics as well Active Problems:   COPD exacerbation   Essential hypertension    Plan: Start doxycycline. Continue other treatments.    LOS: 0 days   Takao Lizer L 07/09/2015, 8:53 AM

## 2015-07-09 NOTE — H&P (Signed)
PCP:   [name],TESFAYE, MD   Chief Complaint:  Shortness of breath  HPI: 81 year-old female who   has a past medical history of Cancer; Essential hypertension; Arthritis; Diverticulitis; Type II diabetes mellitus; H/O ventral hernia; COPD (chronic obstructive pulmonary disease); Osteoporosis; Adenocarcinoma of lung (08/20/2012); Non-obstructive CAD; Takotsubo cardiomyopathy; Dyslipidemia; and Ventricular bigeminy. today  presents to the hospital with chief complaint of shortness of breath going on for 1 day. Patient also has cough but unable to cough up any phlegm. Patient has low oxygen and is on Glucophage at home. Patient has been using nebulizer with no relief. Patient also says that she had to sleep propped up on 3 pillows in order to get relief at night. She denies chest pain, no nausea vomiting or diarrhea. Echocardiogram showed patient does have history of grade 1 diastolic dysfunction. In the ED patient was given 1 dose of Lasix along with the nebulizer treatment. Patient became hypoxic on ambulation, with O2 sats dropping to 77% on 3 L of oxygen  Allergies:   Allergies  Allergen Reactions  . Lisinopril Cough      Past Medical History  Diagnosis Date  . Cancer     left lung/ 2009/ surg only  . Essential hypertension   . Arthritis   . Diverticulitis   . Type II diabetes mellitus   . H/O ventral hernia   . COPD (chronic obstructive pulmonary disease)   . Osteoporosis   . Adenocarcinoma of lung 08/20/2012  . Non-obstructive CAD     a. 04/2015 NSTEMI/Cath: LAD 10p, LCX 15m RCA 365m20d, EF 35-40 w/ apical ballooning.  . Takotsubo cardiomyopathy     a. 04/2015 Echo: EF 45-50%, mid-dist anterior/apical/inferoapical HK w/ hyperdynamic base. Gr 1 DD, mild AI, mild-mod MR, triv TR, PASP 4824m;  b. 04/2015 LV gram: Ef 35-40% w/ apical ballooning.  . DMarland Kitchenslipidemia   . Ventricular bigeminy     a. 04/2015 in setting of NSTEMI/Takotsubo.    Past Surgical History  Procedure  Laterality Date  . Cholecystectomy    . Ectopic pregnancy surgery    . Abdominal hysterectomy    . Lung cancer surgery    . Incisional hernia repair N/A 08/26/2013    Procedure: HERNIA REPAIR INCISIONAL WITH MESH;  Surgeon: MarJamesetta SoD;  Location: [facility];  Service: General;  Laterality: N/A;  . Colonoscopy N/A 09/18/2014    Procedure: COLONOSCOPY;  Surgeon: SanDanie BinderD;  Location: [facility];  Service: Endoscopy;  Laterality: N/A;  8:30 AM - moved to 10:30 - CRosendo Gros notify pt  . Cardiac catheterization N/A 04/17/2015    Procedure: Left Heart Cath and Coronary Angiography;  Surgeon: ThoTroy SineD;  Location: [facility];  Service: Cardiovascular;  Laterality: N/A;    Prior to Admission medications   Medication Sig Start Date End Date Taking? Authorizing Provider  albuterol (PROAIR HFA) 108 (90 BASE) MCG/ACT inhaler Inhale 2 puffs into the lungs every 4 (four) hours as needed for wheezing or shortness of breath.   Yes Historical Provider, MD  aspirin EC 81 MG tablet Take 81 mg by mouth daily.   Yes Historical Provider, MD  Calcium Carbonate-Vitamin D (CALCIUM 600 + D PO) Take 1 tablet by mouth daily.   Yes Historical Provider, MD  fish oil-omega-3 fatty acids 1000 MG capsule Take 1 g by mouth 2 (two) times daily.    Yes Historical Provider, MD  Garlic 300419 TABS Take 300 mg by  mouth daily.   Yes Historical Provider, MD  alendronate (FOSAMAX) 70 MG tablet Take 70 mg by mouth once a week. On Fridays 03/14/15   Historical Provider, MD  ALPRAZolam Duanne Moron) 0.25 MG tablet Take 0.25 mg by mouth 2 (two) times daily. 04/30/15   Historical Provider, MD  atorvastatin (LIPITOR) 80 MG tablet Take 1 tablet (80 mg total) by mouth daily at 6 PM. 06/26/15   Jettie Booze, MD  buPROPion [facility] SR) 150 MG 12 hr tablet Take 150 mg by mouth 2 (two) times daily.    Historical Provider, MD  denosumab (PROLIA) 60 MG/ML SOLN injection Inject 60 mg into the skin every 6 (six)  months. Administer in upper arm, thigh, or abdomen    Historical Provider, MD  gabapentin (NEURONTIN) 300 MG capsule Take 300 mg by mouth daily.     Historical Provider, MD  ipratropium-albuterol (DUONEB) 0.5-2.5 (3) MG/3ML SOLN Inhale 3 mLs into the lungs every 4 (four) hours as needed (wheezing/shortness of breath).  09/05/14   Historical Provider, MD  linagliptin (TRADJENTA) 5 MG TABS tablet Take 5 mg by mouth daily.    Historical Provider, MD  losartan (COZAAR) 50 MG tablet Take 50 mg by mouth daily. 06/29/15   Historical Provider, MD  meclizine (ANTIVERT) 25 MG tablet Take 1 tablet by mouth 3 (three) times daily. 09/05/14   Historical Provider, MD  metoprolol succinate (TOPROL-XL) 25 MG 24 hr tablet Take 1 tablet (25 mg total) by mouth daily. 06/26/15   Jettie Booze, MD  nitroGLYCERIN (NITROSTAT) 0.4 MG SL tablet Place 1 tablet (0.4 mg total) under the tongue every 5 (five) minutes as needed for chest pain. 04/20/15   Eileen Stanford, PA-C  omeprazole (PRILOSEC) 20 MG capsule Take 20 mg by mouth daily.    Historical Provider, MD  rOPINIRole (REQUIP) 0.25 MG tablet Take 0.25 mg by mouth 3 (three) times daily.  09/05/14   Historical Provider, MD    Social History:  reports that she has quit smoking. She has never used smokeless tobacco. She reports that she does not drink alcohol or use illicit drugs.  Family History  Problem Relation Age of Onset  . Diabetes Mother   . Hypertension Mother   . Diabetes Father   . Asthma    . Cancer    . Colon cancer Neg Hx     Filed Weights   07/08/15 2107 07/09/15 0050  Weight: 78.926 kg (174 lb) 78.654 kg (173 lb 6.4 oz)    All the positives are listed in BOLD  Review of Systems:  HEENT: Headache, blurred vision, runny nose, sore throat Neck: Hypothyroidism, hyperthyroidism,,lymphadenopathy Chest : Shortness of breath, history of COPD, Asthma Heart : Chest pain, history of coronary arterey disease GI:  Nausea, vomiting, diarrhea,  constipation, GERD GU: Dysuria, urgency, frequency of urination, hematuria Neuro: Stroke, seizures, syncope Psych: Depression, anxiety, hallucinations   Physical Exam: Blood pressure 161/64, pulse 90, temperature 97.9 F (36.6 C), temperature source Oral, resp. rate 22, height '5\' 4"'$  (1.626 m), weight 78.654 kg (173 lb 6.4 oz), SpO2 91 %. Constitutional:   Patient is a well-developed and well-nourished female * in no acute distress and cooperative with exam. Head: Normocephalic and atraumatic Mouth: Mucus membranes moist Eyes: PERRL, EOMI, conjunctivae normal Neck: Supple, No Thyromegaly Cardiovascular: RRR, S1 normal, S2 normal Pulmonary/Chest:  Decreased breath sounds bilaterally Abdominal: Soft. Non-tender, non-distended, bowel sounds are normal, no masses, organomegaly, or guarding present.  Neurological: A&O x3, Strength is normal and  symmetric bilaterally, cranial nerve II-XII are grossly intact, no focal motor deficit, sensory intact to light touch bilaterally.  Extremities : No Cyanosis, Clubbing or Edema  Labs on Admission:  Basic Metabolic Panel:  Recent Labs Lab 07/08/15 2113  NA 138  K 4.5  CL 100*  CO2 28  GLUCOSE 145*  BUN 17  CREATININE 1.67*  CALCIUM 9.7   CBC:  Recent Labs Lab 07/08/15 2113  WBC 9.6  NEUTROABS 5.5  HGB 12.7  HCT 40.7  MCV 91.9  PLT 230   Cardiac Enzymes:  Recent Labs Lab 07/08/15 2113  TROPONINI <0.03    BNP (last 3 results)  Recent Labs  01/10/15 2213 04/16/15 0525 07/08/15 2113  BNP 80.0 76.0 392.0*    PRadiological Exams on Admission: Dg Chest 2 View  07/08/2015   CLINICAL DATA:  Dyspnea, onset today.  EXAM: CHEST  2 VIEW  COMPARISON:  04/16/2015  FINDINGS: There is moderate hyperinflation. There is stable tenting of the left hemidiaphragm laterally. There is vascular and interstitial congestive change. There is no alveolar edema. There is no effusion.  IMPRESSION: Vascular and interstitial congestive changes  without alveolar edema. No effusions. Hyperinflation.   Electronically Signed   By: Andreas Newport M.D.   On: 07/08/2015 22:19    EKG: Independently reviewed. *Sinus rhythm   Assessment/Plan Active Problems:   COPD exacerbation   Essential hypertension  COPD exacerbation Start Solu-Medrol 60 g IV every 6 hours, DuoNeb nebulizers every 6 hours, Mucinex tablet by mouth twice a day.  Hypertension Continue metoprolol, Cozaar  Diabetes mellitus Start sliding scale insulin with NovoLog  Grade 1 diastolic dysfunction Chest x-ray shows interstitial edema Patient received Lasix 20 minute grams IV in the ED, the start Lasix 20 mg by mouth daily in a.m .  Code status: Full code  Family discussion: No family at bedside   Time Spent on Admission: 60 min  [facility] Pager: [phone] 07/09/2015, 1:33 AM  If 7PM-7AM, please contact night-coverage  [email or web address]  Password [email or web address]

## 2015-07-09 NOTE — Progress Notes (Signed)
Late Entry for 07/09/2015  Notified Dr. Josephine Cables office for patients daughter Berneice Gandy she asked for a note for work.  MD nurse faxed me the note and I gave it to her.  I also asked MD for neosporin for the patients c/o nasal sores from possible her O2.

## 2015-07-09 NOTE — Clinical Documentation Improvement (Signed)
Please Specify Type and Acuity of CHF: Possible Clinical Conditions?  Chronic Systolic Congestive Heart Failure Chronic Diastolic Congestive Heart Failure Chronic Systolic & Diastolic Congestive Heart Failure Acute Systolic Congestive Heart Failure Acute Diastolic Congestive Heart Failure Acute Systolic & Diastolic Congestive Heart Failure Acute on Chronic Systolic Congestive Heart Failure Acute on Chronic Diastolic Congestive Heart Failure Acute on Chronic Systolic & Diastolic Congestive Heart Failure Other Condition Cannot Clinically Determine  Supporting Information:(As per notes) "CHF Exacerbation" Thank You, Alessandra Grout, RN, BSN, CCDS,Clinical Documentation Specialist:  [phone]  [phone]=[facility] [place]- Health Information Management

## 2015-07-09 NOTE — Care Management Note (Signed)
Case Management Note  Patient Details  Name: Angela Lawson MRN: [id number] Date of Birth: December 17, 1933  Expected Discharge Date:                  Expected Discharge Plan:  Home/Self Care  In-House Referral:  NA  Discharge planning Services  CM Consult  Post Acute Care Choice:  NA Choice offered to:  NA  DME Arranged:    DME Agency:     HH Arranged:    [place] Agency:     Status of Service:  Completed, signed off  Medicare Important Message Given:    Date Medicare IM Given:    Medicare IM give by:    Date Additional Medicare IM Given:    Additional Medicare Important Message give by:     If discussed at [place] of Stay Meetings, dates discussed:    Additional Comments: Pt is from home, lives with daughter and is independent with ADL's. Pt has no HH services or DME needs prior to admission. Pt has home O2 through [employer] and neb machine. Pt plans to discharge home with self care. No CM needs.  Sherald Barge, RN 07/09/2015, 3:38 PM

## 2015-07-09 NOTE — Progress Notes (Signed)
Subjective: Patient was admitted due to cough, congestion and shortness of breath due to acute exacerbation of COPD. Patient is on nebulizer treatment and IV steroid. No fever or chills.  Objective: Vital signs in last 24 hours: Temp:  [97.8 F (36.6 C)-98.6 F (37 C)] 98.6 F (37 C) (08/01 0529) Pulse Rate:  [86-93] 86 (08/01 0529) Resp:  [19-24] 22 (08/01 0050) BP: (133-178)/(57-83) 133/57 mmHg (08/01 0529) SpO2:  [91 %-96 %] 96 % (08/01 0529) Weight:  [78.654 kg (173 lb 6.4 oz)-78.926 kg (174 lb)] 78.654 kg (173 lb 6.4 oz) (08/01 0050) Weight change:  Last BM Date: 07/08/15  Intake/Output from previous day:    PHYSICAL EXAM General appearance: alert and no distress Resp: diminished breath sounds bilaterally, rhonchi bilaterally and wheezes bilaterally Cardio: S1, S2 normal GI: soft, non-tender; bowel sounds normal; no masses,  no organomegaly Extremities: extremities normal, atraumatic, no cyanosis or edema  Lab Results:  Results for orders placed or performed during the hospital encounter of 07/08/15 (from the past 48 hour(s))  CBC with Differential/Platelet     Status: Abnormal   Collection Time: 07/08/15  9:13 PM  Result Value Ref Range   WBC 9.6 4.0 - 10.5 K/uL   RBC 4.43 3.87 - 5.11 MIL/uL   Hemoglobin 12.7 12.0 - 15.0 g/dL   HCT 40.7 36.0 - 46.0 %   MCV 91.9 78.0 - 100.0 fL   MCH 28.7 26.0 - 34.0 pg   MCHC 31.2 30.0 - 36.0 g/dL   RDW 16.0 (H) 11.5 - 15.5 %   Platelets 230 150 - 400 K/uL   Neutrophils Relative % 58 43 - 77 %   Neutro Abs 5.5 1.7 - 7.7 K/uL   Lymphocytes Relative 25 12 - 46 %   Lymphs Abs 2.4 0.7 - 4.0 K/uL   Monocytes Relative 10 3 - 12 %   Monocytes Absolute 1.0 0.1 - 1.0 K/uL   Eosinophils Relative 7 (H) 0 - 5 %   Eosinophils Absolute 0.7 0.0 - 0.7 K/uL   Basophils Relative 0 0 - 1 %   Basophils Absolute 0.0 0.0 - 0.1 K/uL  Basic metabolic panel     Status: Abnormal   Collection Time: 07/08/15  9:13 PM  Result Value Ref Range   Sodium  138 135 - 145 mmol/L   Potassium 4.5 3.5 - 5.1 mmol/L   Chloride 100 (L) 101 - 111 mmol/L   CO2 28 22 - 32 mmol/L   Glucose, Bld 145 (H) 65 - 99 mg/dL   BUN 17 6 - 20 mg/dL   Creatinine, Ser 1.67 (H) 0.44 - 1.00 mg/dL   Calcium 9.7 8.9 - 10.3 mg/dL   GFR calc non Af Amer 28 (L) >60 mL/min   GFR calc Af Amer 33 (L) >60 mL/min    Comment: (NOTE) The eGFR has been calculated using the CKD EPI equation. This calculation has not been validated in all clinical situations. eGFR's persistently <60 mL/min signify possible Chronic Kidney Disease.    Anion gap 10 5 - 15  Troponin I     Status: None   Collection Time: 07/08/15  9:13 PM  Result Value Ref Range   Troponin I <0.03 <0.031 ng/mL    Comment:        NO INDICATION OF MYOCARDIAL INJURY.   Brain natriuretic peptide     Status: Abnormal   Collection Time: 07/08/15  9:13 PM  Result Value Ref Range   B Natriuretic Peptide 392.0 (H) 0.0 -  100.0 pg/mL  CBC     Status: Abnormal   Collection Time: 07/09/15  5:22 AM  Result Value Ref Range   WBC 8.6 4.0 - 10.5 K/uL   RBC 4.51 3.87 - 5.11 MIL/uL   Hemoglobin 12.9 12.0 - 15.0 g/dL   HCT 40.9 36.0 - 46.0 %   MCV 90.7 78.0 - 100.0 fL   MCH 28.6 26.0 - 34.0 pg   MCHC 31.5 30.0 - 36.0 g/dL   RDW 15.8 (H) 11.5 - 15.5 %   Platelets 231 150 - 400 K/uL  Comprehensive metabolic panel     Status: Abnormal   Collection Time: 07/09/15  5:22 AM  Result Value Ref Range   Sodium 138 135 - 145 mmol/L   Potassium 4.0 3.5 - 5.1 mmol/L   Chloride 101 101 - 111 mmol/L   CO2 27 22 - 32 mmol/L   Glucose, Bld 186 (H) 65 - 99 mg/dL   BUN 18 6 - 20 mg/dL   Creatinine, Ser 1.39 (H) 0.44 - 1.00 mg/dL   Calcium 9.4 8.9 - 10.3 mg/dL   Total Protein 7.8 6.5 - 8.1 g/dL   Albumin 3.8 3.5 - 5.0 g/dL   AST 21 15 - 41 U/L   ALT 18 14 - 54 U/L   Alkaline Phosphatase 58 38 - 126 U/L   Total Bilirubin 0.5 0.3 - 1.2 mg/dL   GFR calc non Af Amer 35 (L) >60 mL/min   GFR calc Af Amer 41 (L) >60 mL/min    Comment:  (NOTE) The eGFR has been calculated using the CKD EPI equation. This calculation has not been validated in all clinical situations. eGFR's persistently <60 mL/min signify possible Chronic Kidney Disease.    Anion gap 10 5 - 15    ABGS No results for input(s): PHART, PO2ART, TCO2, HCO3 in the last 72 hours.  Invalid input(s): PCO2 CULTURES No results found for this or any previous visit (from the past 240 hour(s)). Studies/Results: Dg Chest 2 View  07/08/2015   CLINICAL DATA:  Dyspnea, onset today.  EXAM: CHEST  2 VIEW  COMPARISON:  04/16/2015  FINDINGS: There is moderate hyperinflation. There is stable tenting of the left hemidiaphragm laterally. There is vascular and interstitial congestive change. There is no alveolar edema. There is no effusion.  IMPRESSION: Vascular and interstitial congestive changes without alveolar edema. No effusions. Hyperinflation.   Electronically Signed   By: Andreas Newport M.D.   On: 07/08/2015 22:19    Medications: I have reviewed the patient's current medications.  Assesment:   Active Problems:   COPD exacerbation   Essential hypertension    Plan:  Medications reviewed Continue current treatment Continue iv steroid and nebulizer treatment Will do pulmonary consult.    LOS: 0 days   Moselle Rister 07/09/2015, 8:05 AM

## 2015-07-10 LAB — HEMOGLOBIN A1C
Hgb A1c MFr Bld: 6.9 % — ABNORMAL HIGH (ref 4.8–5.6)
Mean Plasma Glucose: 151 mg/dL

## 2015-07-10 LAB — GLUCOSE, CAPILLARY
Glucose-Capillary: 163 mg/dL — ABNORMAL HIGH (ref 65–99)
Glucose-Capillary: 222 mg/dL — ABNORMAL HIGH (ref 65–99)
Glucose-Capillary: 161 mg/dL — ABNORMAL HIGH (ref 65–99)
Glucose-Capillary: 168 mg/dL — ABNORMAL HIGH (ref 65–99)

## 2015-07-10 MED ORDER — METHYLPREDNISOLONE SODIUM SUCC 125 MG IJ SOLR
60.0000 mg | Freq: Two times a day (BID) | INTRAMUSCULAR | Status: DC
  Administered 2015-07-10: 60 mg via INTRAVENOUS
  Filled 2015-07-10 (×2): qty 2

## 2015-07-10 NOTE — Progress Notes (Signed)
Subjective: She feels better. She says her breathing is much better. She's not having any swelling. She was able to lie flat to sleep  Objective: Vital signs in last 24 hours: Temp:  [97.7 F (36.5 C)-98.3 F (36.8 C)] 97.7 F (36.5 C) (08/02 0624) Pulse Rate:  [82-91] 82 (08/02 0624) Resp:  [18] 18 (08/02 0624) BP: (119-143)/(52-54) 119/54 mmHg (08/02 0624) SpO2:  [91 %-98 %] 97 % (08/02 0720) Weight change:  Last BM Date: 07/08/15  Intake/Output from previous day: 08/01 0701 - 08/02 0700 In: 500 [IV Piggyback:500] Out: -   PHYSICAL EXAM General appearance: alert, cooperative and no distress Resp: clear to auscultation bilaterally Cardio: regular rate and rhythm, S1, S2 normal, no murmur, click, rub or gallop GI: soft, non-tender; bowel sounds normal; no masses,  no organomegaly Extremities: extremities normal, atraumatic, no cyanosis or edema  Lab Results:  Results for orders placed or performed during the hospital encounter of 07/08/15 (from the past 48 hour(s))  CBC with Differential/Platelet     Status: Abnormal   Collection Time: 07/08/15  9:13 PM  Result Value Ref Range   WBC 9.6 4.0 - 10.5 K/uL   RBC 4.43 3.87 - 5.11 MIL/uL   Hemoglobin 12.7 12.0 - 15.0 g/dL   HCT 40.7 36.0 - 46.0 %   MCV 91.9 78.0 - 100.0 fL   MCH 28.7 26.0 - 34.0 pg   MCHC 31.2 30.0 - 36.0 g/dL   RDW 16.0 (H) 11.5 - 15.5 %   Platelets 230 150 - 400 K/uL   Neutrophils Relative % 58 43 - 77 %   Neutro Abs 5.5 1.7 - 7.7 K/uL   Lymphocytes Relative 25 12 - 46 %   Lymphs Abs 2.4 0.7 - 4.0 K/uL   Monocytes Relative 10 3 - 12 %   Monocytes Absolute 1.0 0.1 - 1.0 K/uL   Eosinophils Relative 7 (H) 0 - 5 %   Eosinophils Absolute 0.7 0.0 - 0.7 K/uL   Basophils Relative 0 0 - 1 %   Basophils Absolute 0.0 0.0 - 0.1 K/uL  Basic metabolic panel     Status: Abnormal   Collection Time: 07/08/15  9:13 PM  Result Value Ref Range   Sodium 138 135 - 145 mmol/L   Potassium 4.5 3.5 - 5.1 mmol/L   Chloride  100 (L) 101 - 111 mmol/L   CO2 28 22 - 32 mmol/L   Glucose, Bld 145 (H) 65 - 99 mg/dL   BUN 17 6 - 20 mg/dL   Creatinine, Ser 1.67 (H) 0.44 - 1.00 mg/dL   Calcium 9.7 8.9 - 10.3 mg/dL   GFR calc non Af Amer 28 (L) >60 mL/min   GFR calc Af Amer 33 (L) >60 mL/min    Comment: (NOTE) The eGFR has been calculated using the CKD EPI equation. This calculation has not been validated in all clinical situations. eGFR's persistently <60 mL/min signify possible Chronic Kidney Disease.    Anion gap 10 5 - 15  Troponin I     Status: None   Collection Time: 07/08/15  9:13 PM  Result Value Ref Range   Troponin I <0.03 <0.031 ng/mL    Comment:        NO INDICATION OF MYOCARDIAL INJURY.   Brain natriuretic peptide     Status: Abnormal   Collection Time: 07/08/15  9:13 PM  Result Value Ref Range   B Natriuretic Peptide 392.0 (H) 0.0 - 100.0 pg/mL  CBC     Status:  Abnormal   Collection Time: 07/09/15  5:22 AM  Result Value Ref Range   WBC 8.6 4.0 - 10.5 K/uL   RBC 4.51 3.87 - 5.11 MIL/uL   Hemoglobin 12.9 12.0 - 15.0 g/dL   HCT 40.9 36.0 - 46.0 %   MCV 90.7 78.0 - 100.0 fL   MCH 28.6 26.0 - 34.0 pg   MCHC 31.5 30.0 - 36.0 g/dL   RDW 15.8 (H) 11.5 - 15.5 %   Platelets 231 150 - 400 K/uL  Comprehensive metabolic panel     Status: Abnormal   Collection Time: 07/09/15  5:22 AM  Result Value Ref Range   Sodium 138 135 - 145 mmol/L   Potassium 4.0 3.5 - 5.1 mmol/L   Chloride 101 101 - 111 mmol/L   CO2 27 22 - 32 mmol/L   Glucose, Bld 186 (H) 65 - 99 mg/dL   BUN 18 6 - 20 mg/dL   Creatinine, Ser 1.39 (H) 0.44 - 1.00 mg/dL   Calcium 9.4 8.9 - 10.3 mg/dL   Total Protein 7.8 6.5 - 8.1 g/dL   Albumin 3.8 3.5 - 5.0 g/dL   AST 21 15 - 41 U/L   ALT 18 14 - 54 U/L   Alkaline Phosphatase 58 38 - 126 U/L   Total Bilirubin 0.5 0.3 - 1.2 mg/dL   GFR calc non Af Amer 35 (L) >60 mL/min   GFR calc Af Amer 41 (L) >60 mL/min    Comment: (NOTE) The eGFR has been calculated using the CKD EPI  equation. This calculation has not been validated in all clinical situations. eGFR's persistently <60 mL/min signify possible Chronic Kidney Disease.    Anion gap 10 5 - 15  Hemoglobin A1c     Status: Abnormal   Collection Time: 07/09/15  5:22 AM  Result Value Ref Range   Hgb A1c MFr Bld 6.9 (H) 4.8 - 5.6 %    Comment: (NOTE)         Pre-diabetes: 5.7 - 6.4         Diabetes: >6.4         Glycemic control for adults with diabetes: <7.0    Mean Plasma Glucose 151 mg/dL    Comment: (NOTE) Performed At: [facility] [address] [place], [place] [id number] Lindon Romp MD BE:[id number]   Glucose, capillary     Status: Abnormal   Collection Time: 07/09/15 12:33 PM  Result Value Ref Range   Glucose-Capillary 181 (H) 65 - 99 mg/dL   Comment 1 Notify RN    Comment 2 Document in Chart   Glucose, capillary     Status: Abnormal   Collection Time: 07/09/15  4:51 PM  Result Value Ref Range   Glucose-Capillary 182 (H) 65 - 99 mg/dL   Comment 1 Notify RN    Comment 2 Document in Chart   Glucose, capillary     Status: Abnormal   Collection Time: 07/09/15  8:44 PM  Result Value Ref Range   Glucose-Capillary 174 (H) 65 - 99 mg/dL   Comment 1 Notify RN   Glucose, capillary     Status: Abnormal   Collection Time: 07/10/15  7:55 AM  Result Value Ref Range   Glucose-Capillary 163 (H) 65 - 99 mg/dL    ABGS No results for input(s): PHART, PO2ART, TCO2, HCO3 in the last 72 hours.  Invalid input(s): PCO2 CULTURES No results found for this or any previous visit (from the past 240 hour(s)). Studies/Results: Dg Chest 2  View  07/08/2015   CLINICAL DATA:  Dyspnea, onset today.  EXAM: CHEST  2 VIEW  COMPARISON:  04/16/2015  FINDINGS: There is moderate hyperinflation. There is stable tenting of the left hemidiaphragm laterally. There is vascular and interstitial congestive change. There is no alveolar edema. There is no effusion.  IMPRESSION: Vascular and interstitial congestive  changes without alveolar edema. No effusions. Hyperinflation.   Electronically Signed   By: Andreas Newport M.D.   On: 07/08/2015 22:19    Medications:  Prior to Admission:  Prescriptions prior to admission  Medication Sig Dispense Refill Last Dose  . albuterol (PROAIR HFA) 108 (90 BASE) MCG/ACT inhaler Inhale 2 puffs into the lungs every 4 (four) hours as needed for wheezing or shortness of breath.   07/08/2015  . alendronate (FOSAMAX) 70 MG tablet Take 70 mg by mouth once a week. On Fridays   Past Week at Unknown time  . ALPRAZolam (XANAX) 0.25 MG tablet Take 0.25 mg by mouth 2 (two) times daily.  2 Past Week at Unknown time  . aspirin EC 81 MG tablet Take 81 mg by mouth daily.   07/08/2015 at Unknown time  . buPROPion (WELLBUTRIN SR) 150 MG 12 hr tablet Take 150 mg by mouth 2 (two) times daily.   07/08/2015 at Unknown time  . Calcium Carbonate-Vitamin D (CALCIUM 600 + D PO) Take 1 tablet by mouth daily.   07/08/2015  . Cholecalciferol (VITAMIN D-3) 1000 UNITS CAPS Take 1 capsule by mouth daily.   07/08/2015 at Unknown time  . fish oil-omega-3 fatty acids 1000 MG capsule Take 1 g by mouth 2 (two) times daily.    07/08/2015  . gabapentin (NEURONTIN) 300 MG capsule Take 300 mg by mouth daily.    07/08/2015 at Unknown time  . Garlic 188 MG TABS Take 300 mg by mouth daily.   07/08/2015  . ipratropium-albuterol (DUONEB) 0.5-2.5 (3) MG/3ML SOLN Inhale 3 mLs into the lungs every 4 (four) hours as needed (wheezing/shortness of breath).    Past Week at Unknown time  . linagliptin (TRADJENTA) 5 MG TABS tablet Take 5 mg by mouth daily.   07/08/2015 at Unknown time  . losartan (COZAAR) 50 MG tablet Take 50 mg by mouth daily.  2 07/08/2015 at Unknown time  . meclizine (ANTIVERT) 25 MG tablet Take 1 tablet by mouth 3 (three) times daily as needed for dizziness.    Past Month at Unknown time  . metoprolol succinate (TOPROL-XL) 25 MG 24 hr tablet Take 1 tablet (25 mg total) by mouth daily. 90 tablet 3 07/08/2015 at  Unknown time  . nitroGLYCERIN (NITROSTAT) 0.4 MG SL tablet Place 1 tablet (0.4 mg total) under the tongue every 5 (five) minutes as needed for chest pain. 25 tablet 12 unknown  . omeprazole (PRILOSEC) 20 MG capsule Take 20 mg by mouth daily.   07/08/2015 at Unknown time  . pyridoxine (B-6) 100 MG tablet Take 100 mg by mouth daily.   07/08/2015 at Unknown time  . rOPINIRole (REQUIP) 0.25 MG tablet Take 0.25 mg by mouth 3 (three) times daily.    07/08/2015 at Unknown time   Scheduled: . ALPRAZolam  0.25 mg Oral BID  . aspirin EC  81 mg Oral Daily  . atorvastatin  80 mg Oral q1800  . doxycycline (VIBRAMYCIN) IV  100 mg Intravenous Q12H  . enoxaparin (LOVENOX) injection  40 mg Subcutaneous Q24H  . furosemide  20 mg Oral Daily  . guaiFENesin  600 mg Oral BID  .  insulin aspart  0-9 Units Subcutaneous TID WC  . ipratropium-albuterol  3 mL Nebulization Q6H  . losartan  50 mg Oral Daily  . meclizine  25 mg Oral TID  . methylPREDNISolone (SOLU-MEDROL) injection  60 mg Intravenous Q12H  . metoprolol succinate  25 mg Oral Daily  . neomycin-bacitracin-polymyxin   Topical TID  . rOPINIRole  0.25 mg Oral TID  . sodium chloride  3 mL Intravenous Q12H   Continuous:  KZL:[id number] chloride, acetaminophen **OR** acetaminophen, albuterol, ondansetron **OR** ondansetron (ZOFRAN) IV, sodium chloride  Assesment: She was admitted with COPD exacerbation. She has improved. I discussed her situation with Dr. Legrand Rams and he thinks she'll probably be able to go home tomorrow. I think that is appropriate. I'm trying to get her portable oxygen situation arranged Active Problems:   COPD exacerbation   Essential hypertension    Plan: Continue current treatments    LOS: 1 day   Mekaylah Klich L 07/10/2015, 9:04 AM

## 2015-07-10 NOTE — Care Management Note (Signed)
Case Management Note  Patient Details  Name: Angela Lawson MRN: [id number] Date of Birth: 10-Jul-1934  Expected Discharge Date:                  Expected Discharge Plan:  Home/Self Care  In-House Referral:  NA  Discharge planning Services  CM Consult  Post Acute Care Choice:  Durable Medical Equipment Choice offered to:  Patient  DME Arranged:  Oxygen DME Agency:  [place]:    [facility] Agency:     Status of Service:  In process, will continue to follow  Medicare Important Message Given:    Date Medicare IM Given:    Medicare IM give by:    Date Additional Medicare IM Given:    Additional Medicare Important Message give by:     If discussed at [place] of Stay Meetings, dates discussed:    Additional Comments: Pt told MD that she needs smaller port O2 tank. Pt told that she has the smallest tank offered by [employer]. Pt would like to switch to [facility]. Ernestina Columbia, made aware of referral and will evaluate patient for portable concentrator. Pt told she may be asked to pay OOP for O2 depending on pay periords, pt says this is okay, she is willing to pay OOP. [facility] will obtain pt info and speak with patient. Will cont to follow.  Sherald Barge, RN 07/10/2015, 11:12 AM

## 2015-07-10 NOTE — Progress Notes (Signed)
Subjective: Patient feels better today. Her breathing improving. No fever or chills. No chest pain, nausea or vomiting..  Objective: Vital signs in last 24 hours: Temp:  [97.7 F (36.5 C)-98.3 F (36.8 C)] 97.7 F (36.5 C) (08/02 0624) Pulse Rate:  [82-91] 82 (08/02 0624) Resp:  [18] 18 (08/02 0624) BP: (119-143)/(52-54) 119/54 mmHg (08/02 0624) SpO2:  [91 %-98 %] 97 % (08/02 0720) Weight change:  Last BM Date: 07/08/15  Intake/Output from previous day: 08/01 0701 - 08/02 0700 In: 500 [IV Piggyback:500] Out: -   PHYSICAL EXAM General appearance: alert and no distress Resp: diminished breath sounds bilaterally, rhonchi bilaterally and wheezes bilaterally Cardio: S1, S2 normal GI: soft, non-tender; bowel sounds normal; no masses,  no organomegaly Extremities: extremities normal, atraumatic, no cyanosis or edema  Lab Results:  Results for orders placed or performed during the hospital encounter of 07/08/15 (from the past 48 hour(s))  CBC with Differential/Platelet     Status: Abnormal   Collection Time: 07/08/15  9:13 PM  Result Value Ref Range   WBC 9.6 4.0 - 10.5 K/uL   RBC 4.43 3.87 - 5.11 MIL/uL   Hemoglobin 12.7 12.0 - 15.0 g/dL   HCT 40.7 36.0 - 46.0 %   MCV 91.9 78.0 - 100.0 fL   MCH 28.7 26.0 - 34.0 pg   MCHC 31.2 30.0 - 36.0 g/dL   RDW 16.0 (H) 11.5 - 15.5 %   Platelets 230 150 - 400 K/uL   Neutrophils Relative % 58 43 - 77 %   Neutro Abs 5.5 1.7 - 7.7 K/uL   Lymphocytes Relative 25 12 - 46 %   Lymphs Abs 2.4 0.7 - 4.0 K/uL   Monocytes Relative 10 3 - 12 %   Monocytes Absolute 1.0 0.1 - 1.0 K/uL   Eosinophils Relative 7 (H) 0 - 5 %   Eosinophils Absolute 0.7 0.0 - 0.7 K/uL   Basophils Relative 0 0 - 1 %   Basophils Absolute 0.0 0.0 - 0.1 K/uL  Basic metabolic panel     Status: Abnormal   Collection Time: 07/08/15  9:13 PM  Result Value Ref Range   Sodium 138 135 - 145 mmol/L   Potassium 4.5 3.5 - 5.1 mmol/L   Chloride 100 (L) 101 - 111 mmol/L   CO2 28  22 - 32 mmol/L   Glucose, Bld 145 (H) 65 - 99 mg/dL   BUN 17 6 - 20 mg/dL   Creatinine, Ser 1.67 (H) 0.44 - 1.00 mg/dL   Calcium 9.7 8.9 - 10.3 mg/dL   GFR calc non Af Amer 28 (L) >60 mL/min   GFR calc Af Amer 33 (L) >60 mL/min    Comment: (NOTE) The eGFR has been calculated using the CKD EPI equation. This calculation has not been validated in all clinical situations. eGFR's persistently <60 mL/min signify possible Chronic Kidney Disease.    Anion gap 10 5 - 15  Troponin I     Status: None   Collection Time: 07/08/15  9:13 PM  Result Value Ref Range   Troponin I <0.03 <0.031 ng/mL    Comment:        NO INDICATION OF MYOCARDIAL INJURY.   Brain natriuretic peptide     Status: Abnormal   Collection Time: 07/08/15  9:13 PM  Result Value Ref Range   B Natriuretic Peptide 392.0 (H) 0.0 - 100.0 pg/mL  CBC     Status: Abnormal   Collection Time: 07/09/15  5:22 AM  Result Value  Ref Range   WBC 8.6 4.0 - 10.5 K/uL   RBC 4.51 3.87 - 5.11 MIL/uL   Hemoglobin 12.9 12.0 - 15.0 g/dL   HCT 40.9 36.0 - 46.0 %   MCV 90.7 78.0 - 100.0 fL   MCH 28.6 26.0 - 34.0 pg   MCHC 31.5 30.0 - 36.0 g/dL   RDW 15.8 (H) 11.5 - 15.5 %   Platelets 231 150 - 400 K/uL  Comprehensive metabolic panel     Status: Abnormal   Collection Time: 07/09/15  5:22 AM  Result Value Ref Range   Sodium 138 135 - 145 mmol/L   Potassium 4.0 3.5 - 5.1 mmol/L   Chloride 101 101 - 111 mmol/L   CO2 27 22 - 32 mmol/L   Glucose, Bld 186 (H) 65 - 99 mg/dL   BUN 18 6 - 20 mg/dL   Creatinine, Ser 1.39 (H) 0.44 - 1.00 mg/dL   Calcium 9.4 8.9 - 10.3 mg/dL   Total Protein 7.8 6.5 - 8.1 g/dL   Albumin 3.8 3.5 - 5.0 g/dL   AST 21 15 - 41 U/L   ALT 18 14 - 54 U/L   Alkaline Phosphatase 58 38 - 126 U/L   Total Bilirubin 0.5 0.3 - 1.2 mg/dL   GFR calc non Af Amer 35 (L) >60 mL/min   GFR calc Af Amer 41 (L) >60 mL/min    Comment: (NOTE) The eGFR has been calculated using the CKD EPI equation. This calculation has not been  validated in all clinical situations. eGFR's persistently <60 mL/min signify possible Chronic Kidney Disease.    Anion gap 10 5 - 15  Glucose, capillary     Status: Abnormal   Collection Time: 07/09/15 12:33 PM  Result Value Ref Range   Glucose-Capillary 181 (H) 65 - 99 mg/dL   Comment 1 Notify RN    Comment 2 Document in Chart   Glucose, capillary     Status: Abnormal   Collection Time: 07/09/15  4:51 PM  Result Value Ref Range   Glucose-Capillary 182 (H) 65 - 99 mg/dL   Comment 1 Notify RN    Comment 2 Document in Chart   Glucose, capillary     Status: Abnormal   Collection Time: 07/09/15  8:44 PM  Result Value Ref Range   Glucose-Capillary 174 (H) 65 - 99 mg/dL   Comment 1 Notify RN     ABGS No results for input(s): PHART, PO2ART, TCO2, HCO3 in the last 72 hours.  Invalid input(s): PCO2 CULTURES No results found for this or any previous visit (from the past 240 hour(s)). Studies/Results: Dg Chest 2 View  07/08/2015   CLINICAL DATA:  Dyspnea, onset today.  EXAM: CHEST  2 VIEW  COMPARISON:  04/16/2015  FINDINGS: There is moderate hyperinflation. There is stable tenting of the left hemidiaphragm laterally. There is vascular and interstitial congestive change. There is no alveolar edema. There is no effusion.  IMPRESSION: Vascular and interstitial congestive changes without alveolar edema. No effusions. Hyperinflation.   Electronically Signed   By: Andreas Newport M.D.   On: 07/08/2015 22:19    Medications: I have reviewed the patient's current medications.  Assesment:   Active Problems:   COPD exacerbation   Essential hypertension    Plan:  Medications reviewed Continue current treatment continue nebulizer treatment Will taper IV steroid Will do pulmonary consult.appreciated    LOS: 1 day   Angela Lawson 07/10/2015, 8:22 AM

## 2015-07-11 LAB — GLUCOSE, CAPILLARY
Glucose-Capillary: 147 mg/dL — ABNORMAL HIGH (ref 65–99)
Glucose-Capillary: 295 mg/dL — ABNORMAL HIGH (ref 65–99)

## 2015-07-11 MED ORDER — DOXYCYCLINE HYCLATE 50 MG PO CAPS: 50.0000 mg | ORAL_CAPSULE | Freq: Two times a day (BID) | ORAL | Status: DC

## 2015-07-11 MED ORDER — PREDNISONE 10 MG PO TABS: ORAL_TABLET | ORAL | Status: DC

## 2015-07-11 MED ORDER — PREDNISONE 20 MG PO TABS
40.0000 mg | ORAL_TABLET | Freq: Every day | ORAL | Status: DC
  Administered 2015-07-11: 40 mg via ORAL
  Filled 2015-07-11: qty 2

## 2015-07-11 NOTE — Care Management Note (Signed)
Case Management Note  Patient Details  Name: NEFERTARI REBMAN MRN: [id number] Date of Birth: 1934-01-05   Expected Discharge Date:   07/11/2015               Expected Discharge Plan:  Home/Self Care  In-House Referral:  NA  Discharge planning Services  CM Consult  Post Acute Care Choice:  Durable Medical Equipment Choice offered to:  Patient  DME Arranged:  Oxygen DME Agency:  [place]:    [facility] Agency:     Status of Service:  Completed, signed off  Medicare Important Message Given:  N/A - LOS <3 / Initial given by admissions Date Medicare IM Given:    Medicare IM give by:    Date Additional Medicare IM Given:    Additional Medicare Important Message give by:     If discussed at [place] of Stay Meetings, dates discussed:    Additional Comments: Pt discharging home today with self care. [facility] has delivered pt's portable O2 tanks, pt will have eval for port concentrator once she gets home. Pt's daughter (who pt lives with) says her and her daughter are in and out of the house and there are time the pt's daughter has to work late and Ms Remmel has to be home alone for periods of time. Asking about someone to come and check on patient when she is alone. Explained that pt does not need skilled nursing care at home and would be more appropriate for aid services. Pt does not have medicaid, given list of [place] care agencies. Pt says she will call her insurance company to see if they will pay for aid services. No further CM needs at this time.  Sherald Barge, RN 07/11/2015, 1:02 PM

## 2015-07-11 NOTE — Clinical Documentation Improvement (Signed)
Possible Clinical Conditions?  Acute Respiratory Failure Acute on Chronic Respiratory Failure Chronic Respiratory Failure Acute Respiratory Insufficiency Acute Respiratory Insufficiency following surgery or trauma Other Condition Cannot Clinically Determine   Supporting Information:(As per notes) "Patient became hypoxic on ambulation, with O2 sats dropping to 77% on 3 L of oxygen " Pt state she is on 2L of oxygen at home."  Risk Factors:COPD exacerbation  Thank You, Alessandra Grout, RN, BSN, CCDS,Clinical Documentation Specialist:  [phone]  [phone]=[employer] [place]- Health Information Management

## 2015-07-11 NOTE — Care Management Important Message (Signed)
Important Message  Patient Details  Name: Angela Lawson MRN: [id number] Date of Birth: 1934-05-24   Medicare Important Message Given:  N/A - LOS <3 / Initial given by admissions    Sherald Barge, RN 07/11/2015, 1:01 PM

## 2015-07-11 NOTE — Progress Notes (Signed)
SATURATION QUALIFICATIONS: (This note is used to comply with regulatory documentation for home oxygen)  Patient Saturations on Room Air at Rest = 91%  Patient Saturations on Room Air while Ambulating = 83%  Patient Saturations on 3 Liters of oxygen at Rest= 97%

## 2015-07-11 NOTE — Progress Notes (Signed)
Discharge instructions given, verbalized understanding, out in stable condition via w/c with staff. 

## 2015-07-11 NOTE — Progress Notes (Signed)
Subjective: She says she feels better and hopes to go home. She has no new complaints.  Objective: Vital signs in last 24 hours: Temp:  [97.3 F (36.3 C)-98.4 F (36.9 C)] 97.7 F (36.5 C) (08/03 0618) Pulse Rate:  [72-88] 72 (08/03 0618) Resp:  [16-20] 16 (08/03 0618) BP: (117-132)/(40-56) 125/56 mmHg (08/03 0618) SpO2:  [94 %-99 %] 94 % (08/03 0740) Weight change:  Last BM Date: 07/10/15  Intake/Output from previous day: 08/02 0701 - 08/03 0700 In: 480 [P.O.:480] Out: -   PHYSICAL EXAM General appearance: alert, cooperative and no distress Resp: clear to auscultation bilaterally Cardio: regular rate and rhythm, S1, S2 normal, no murmur, click, rub or gallop GI: soft, non-tender; bowel sounds normal; no masses,  no organomegaly Extremities: extremities normal, atraumatic, no cyanosis or edema  Lab Results:  Results for orders placed or performed during the hospital encounter of 07/08/15 (from the past 48 hour(s))  Glucose, capillary     Status: Abnormal   Collection Time: 07/09/15 12:33 PM  Result Value Ref Range   Glucose-Capillary 181 (H) 65 - 99 mg/dL   Comment 1 Notify RN    Comment 2 Document in Chart   Glucose, capillary     Status: Abnormal   Collection Time: 07/09/15  4:51 PM  Result Value Ref Range   Glucose-Capillary 182 (H) 65 - 99 mg/dL   Comment 1 Notify RN    Comment 2 Document in Chart   Glucose, capillary     Status: Abnormal   Collection Time: 07/09/15  8:44 PM  Result Value Ref Range   Glucose-Capillary 174 (H) 65 - 99 mg/dL   Comment 1 Notify RN   Glucose, capillary     Status: Abnormal   Collection Time: 07/10/15  7:55 AM  Result Value Ref Range   Glucose-Capillary 163 (H) 65 - 99 mg/dL  Glucose, capillary     Status: Abnormal   Collection Time: 07/10/15 11:35 AM  Result Value Ref Range   Glucose-Capillary 222 (H) 65 - 99 mg/dL  Glucose, capillary     Status: Abnormal   Collection Time: 07/10/15  4:20 PM  Result Value Ref Range    Glucose-Capillary 161 (H) 65 - 99 mg/dL  Glucose, capillary     Status: Abnormal   Collection Time: 07/10/15  9:10 PM  Result Value Ref Range   Glucose-Capillary 168 (H) 65 - 99 mg/dL   Comment 1 Notify RN    Comment 2 Document in Chart   Glucose, capillary     Status: Abnormal   Collection Time: 07/11/15  7:19 AM  Result Value Ref Range   Glucose-Capillary 147 (H) 65 - 99 mg/dL   Comment 1 Notify RN     ABGS No results for input(s): PHART, PO2ART, TCO2, HCO3 in the last 72 hours.  Invalid input(s): PCO2 CULTURES No results found for this or any previous visit (from the past 240 hour(s)). Studies/Results: No results found.  Medications:  Prior to Admission:  Prescriptions prior to admission  Medication Sig Dispense Refill Last Dose  . albuterol (PROAIR HFA) 108 (90 BASE) MCG/ACT inhaler Inhale 2 puffs into the lungs every 4 (four) hours as needed for wheezing or shortness of breath.   07/08/2015  . alendronate (FOSAMAX) 70 MG tablet Take 70 mg by mouth once a week. On Fridays   Past Week at Unknown time  . ALPRAZolam (XANAX) 0.25 MG tablet Take 0.25 mg by mouth 2 (two) times daily.  2 Past Week at Unknown  time  . aspirin EC 81 MG tablet Take 81 mg by mouth daily.   07/08/2015 at Unknown time  . buPROPion (WELLBUTRIN SR) 150 MG 12 hr tablet Take 150 mg by mouth 2 (two) times daily.   07/08/2015 at Unknown time  . Calcium Carbonate-Vitamin D (CALCIUM 600 + D PO) Take 1 tablet by mouth daily.   07/08/2015  . Cholecalciferol (VITAMIN D-3) 1000 UNITS CAPS Take 1 capsule by mouth daily.   07/08/2015 at Unknown time  . fish oil-omega-3 fatty acids 1000 MG capsule Take 1 g by mouth 2 (two) times daily.    07/08/2015  . gabapentin (NEURONTIN) 300 MG capsule Take 300 mg by mouth daily.    07/08/2015 at Unknown time  . Garlic 759 MG TABS Take 300 mg by mouth daily.   07/08/2015  . ipratropium-albuterol (DUONEB) 0.5-2.5 (3) MG/3ML SOLN Inhale 3 mLs into the lungs every 4 (four) hours as needed  (wheezing/shortness of breath).    Past Week at Unknown time  . linagliptin (TRADJENTA) 5 MG TABS tablet Take 5 mg by mouth daily.   07/08/2015 at Unknown time  . losartan (COZAAR) 50 MG tablet Take 50 mg by mouth daily.  2 07/08/2015 at Unknown time  . meclizine (ANTIVERT) 25 MG tablet Take 1 tablet by mouth 3 (three) times daily as needed for dizziness.    Past Month at Unknown time  . metoprolol succinate (TOPROL-XL) 25 MG 24 hr tablet Take 1 tablet (25 mg total) by mouth daily. 90 tablet 3 07/08/2015 at Unknown time  . nitroGLYCERIN (NITROSTAT) 0.4 MG SL tablet Place 1 tablet (0.4 mg total) under the tongue every 5 (five) minutes as needed for chest pain. 25 tablet 12 unknown  . omeprazole (PRILOSEC) 20 MG capsule Take 20 mg by mouth daily.   07/08/2015 at Unknown time  . pyridoxine (B-6) 100 MG tablet Take 100 mg by mouth daily.   07/08/2015 at Unknown time  . rOPINIRole (REQUIP) 0.25 MG tablet Take 0.25 mg by mouth 3 (three) times daily.    07/08/2015 at Unknown time   Scheduled: . ALPRAZolam  0.25 mg Oral BID  . aspirin EC  81 mg Oral Daily  . atorvastatin  80 mg Oral q1800  . doxycycline (VIBRAMYCIN) IV  100 mg Intravenous Q12H  . enoxaparin (LOVENOX) injection  40 mg Subcutaneous Q24H  . furosemide  20 mg Oral Daily  . guaiFENesin  600 mg Oral BID  . insulin aspart  0-9 Units Subcutaneous TID WC  . ipratropium-albuterol  3 mL Nebulization Q6H  . losartan  50 mg Oral Daily  . meclizine  25 mg Oral TID  . methylPREDNISolone (SOLU-MEDROL) injection  60 mg Intravenous Q12H  . metoprolol succinate  25 mg Oral Daily  . neomycin-bacitracin-polymyxin   Topical TID  . rOPINIRole  0.25 mg Oral TID  . sodium chloride  3 mL Intravenous Q12H   Continuous:  FMB:WGYKZL chloride, acetaminophen **OR** acetaminophen, albuterol, ondansetron **OR** ondansetron (ZOFRAN) IV, sodium chloride  Assesment: She was admitted with COPD exacerbation. She is significantly improved. No other new  complaints. Active Problems:   COPD exacerbation   Essential hypertension    Plan: Continue current treatments. She is going to have a different portable oxygen delivery system. I put her on prednisone instead of the Solu-Medrol    LOS: 2 days   Ashira Kirsten L 07/11/2015, 8:24 AM

## 2015-07-13 NOTE — Discharge Summary (Signed)
Physician Discharge Summary  Patient ID: Angela Lawson MRN: [id number] DOB/AGE: 04-21-1934 81 y.o. Primary Care Physician:Sakib Noguez, MD Admit date: 07/08/2015 Discharge date: 07/11/2015   Discharge Diagnoses:   Active Problems:   COPD exacerbation   Essential hypertension     Medication List    TAKE these medications        alendronate 70 MG tablet  Commonly known as:  FOSAMAX  Take 70 mg by mouth once a week. On Fridays     ALPRAZolam 0.25 MG tablet  Commonly known as:  XANAX  Take 0.25 mg by mouth 2 (two) times daily.     aspirin EC 81 MG tablet  Take 81 mg by mouth daily.     buPROPion 150 MG 12 hr tablet  Commonly known as:  WELLBUTRIN SR  Take 150 mg by mouth 2 (two) times daily.     CALCIUM 600 + D PO  Take 1 tablet by mouth daily.     doxycycline 50 MG capsule  Commonly known as:  VIBRAMYCIN  Take 1 capsule (50 mg total) by mouth 2 (two) times daily.     fish oil-omega-3 fatty acids 1000 MG capsule  Take 1 g by mouth 2 (two) times daily.     gabapentin 300 MG capsule  Commonly known as:  NEURONTIN  Take 300 mg by mouth daily.     Garlic 578 MG Tabs  Take 300 mg by mouth daily.     ipratropium-albuterol 0.5-2.5 (3) MG/3ML Soln  Commonly known as:  DUONEB  Inhale 3 mLs into the lungs every 4 (four) hours as needed (wheezing/shortness of breath).     linagliptin 5 MG Tabs tablet  Commonly known as:  TRADJENTA  Take 5 mg by mouth daily.     losartan 50 MG tablet  Commonly known as:  COZAAR  Take 50 mg by mouth daily.     meclizine 25 MG tablet  Commonly known as:  ANTIVERT  Take 1 tablet by mouth 3 (three) times daily as needed for dizziness.     metoprolol succinate 25 MG 24 hr tablet  Commonly known as:  TOPROL-XL  Take 1 tablet (25 mg total) by mouth daily.     nitroGLYCERIN 0.4 MG SL tablet  Commonly known as:  NITROSTAT  Place 1 tablet (0.4 mg total) under the tongue every 5 (five) minutes as needed for chest pain.     omeprazole  20 MG capsule  Commonly known as:  PRILOSEC  Take 20 mg by mouth daily.     predniSONE 10 MG tablet  Commonly known as:  DELTASONE  40 mg po daily for 3 days, then 30 mg po daily for 3 days, then 20 mg po daily for 3 days then 1 tab daily for 3 days then stop.     PROAIR HFA 108 (90 BASE) MCG/ACT inhaler  Generic drug:  albuterol  Inhale 2 puffs into the lungs every 4 (four) hours as needed for wheezing or shortness of breath.     pyridoxine 100 MG tablet  Commonly known as:  B-6  Take 100 mg by mouth daily.     rOPINIRole 0.25 MG tablet  Commonly known as:  REQUIP  Take 0.25 mg by mouth 3 (three) times daily.     Vitamin D-3 1000 UNITS Caps  Take 1 capsule by mouth daily.        Discharged Condition: home    Consults: Pulmonary  Significant Diagnostic Studies: Dg Chest 2 View  07/08/2015   CLINICAL DATA:  Dyspnea, onset today.  EXAM: CHEST  2 VIEW  COMPARISON:  04/16/2015  FINDINGS: There is moderate hyperinflation. There is stable tenting of the left hemidiaphragm laterally. There is vascular and interstitial congestive change. There is no alveolar edema. There is no effusion.  IMPRESSION: Vascular and interstitial congestive changes without alveolar edema. No effusions. Hyperinflation.   Electronically Signed   By: Andreas Newport M.D.   On: 07/08/2015 22:19    Lab Results: Basic Metabolic Panel: No results for input(s): NA, K, CL, CO2, GLUCOSE, BUN, CREATININE, CALCIUM, MG, PHOS in the last 72 hours. Liver Function Tests: No results for input(s): AST, ALT, ALKPHOS, BILITOT, PROT, ALBUMIN in the last 72 hours.   CBC: No results for input(s): WBC, NEUTROABS, HGB, HCT, MCV, PLT in the last 72 hours.  No results found for this or any previous visit (from the past 240 hour(s)).   Hospital Course:   This is a 81 years old female patient with history of multiple medical illnesses was admitted due to COPD exacerbations. Patient was treated with IV steroid and nebulizer  treatment. Patient improved and discharged in stable condition.  Discharge Exam: Blood pressure 125/56, pulse 72, temperature 97.7 F (36.5 C), temperature source Oral, resp. rate 16, height '5\' 4"'$  (1.626 m), weight 78.654 kg (173 lb 6.4 oz), SpO2 97 %.    Disposition:  home        Follow-up Information    Follow up with [facility], MD On 07/25/2015.   Specialty:  Internal Medicine   Why:  at 10:00 am   Contact information:   [place] [address] [phone]       Signed: Rosita Fire   07/13/2015, 7:36 AM

## 2015-07-23 DIAGNOSIS — N183 Chronic kidney disease, stage 3 (moderate): Secondary | ICD-10-CM | POA: Diagnosis not present

## 2015-07-23 DIAGNOSIS — I517 Cardiomegaly: Secondary | ICD-10-CM | POA: Diagnosis not present

## 2015-07-23 DIAGNOSIS — E119 Type 2 diabetes mellitus without complications: Secondary | ICD-10-CM | POA: Diagnosis not present

## 2015-07-23 DIAGNOSIS — I10 Essential (primary) hypertension: Secondary | ICD-10-CM | POA: Diagnosis not present

## 2015-08-01 ENCOUNTER — Inpatient Hospital Stay (HOSPITAL_COMMUNITY)
Admission: EM | Admit: 2015-08-01 | Discharge: 2015-08-02 | DRG: 293 | Disposition: A | Discharge status: Home / Self Care | Payer: Medicare Other | Attending: Internal Medicine | Admitting: Internal Medicine

## 2015-08-01 ENCOUNTER — Encounter (HOSPITAL_COMMUNITY): Payer: Self-pay | Admitting: Emergency Medicine

## 2015-08-01 ENCOUNTER — Emergency Department (HOSPITAL_COMMUNITY): Payer: Medicare Other

## 2015-08-01 ENCOUNTER — Inpatient Hospital Stay (HOSPITAL_BASED_OUTPATIENT_CLINIC_OR_DEPARTMENT_OTHER): Payer: Medicare Other

## 2015-08-01 DIAGNOSIS — Z833 Family history of diabetes mellitus: Secondary | ICD-10-CM | POA: Diagnosis not present

## 2015-08-01 DIAGNOSIS — N183 Chronic kidney disease, stage 3 unspecified: Secondary | ICD-10-CM | POA: Diagnosis present

## 2015-08-01 DIAGNOSIS — Z85118 Personal history of other malignant neoplasm of bronchus and lung: Secondary | ICD-10-CM

## 2015-08-01 DIAGNOSIS — M1990 Unspecified osteoarthritis, unspecified site: Secondary | ICD-10-CM | POA: Diagnosis present

## 2015-08-01 DIAGNOSIS — I5181 Takotsubo syndrome: Secondary | ICD-10-CM | POA: Diagnosis present

## 2015-08-01 DIAGNOSIS — I10 Essential (primary) hypertension: Secondary | ICD-10-CM | POA: Diagnosis present

## 2015-08-01 DIAGNOSIS — R0602 Shortness of breath: Secondary | ICD-10-CM | POA: Diagnosis not present

## 2015-08-01 DIAGNOSIS — I5032 Chronic diastolic (congestive) heart failure: Secondary | ICD-10-CM

## 2015-08-01 DIAGNOSIS — I2510 Atherosclerotic heart disease of native coronary artery without angina pectoris: Secondary | ICD-10-CM | POA: Diagnosis present

## 2015-08-01 DIAGNOSIS — I5033 Acute on chronic diastolic (congestive) heart failure: Principal | ICD-10-CM | POA: Diagnosis present

## 2015-08-01 DIAGNOSIS — Z7982 Long term (current) use of aspirin: Secondary | ICD-10-CM

## 2015-08-01 DIAGNOSIS — E785 Hyperlipidemia, unspecified: Secondary | ICD-10-CM | POA: Diagnosis present

## 2015-08-01 DIAGNOSIS — I252 Old myocardial infarction: Secondary | ICD-10-CM | POA: Diagnosis not present

## 2015-08-01 DIAGNOSIS — E1122 Type 2 diabetes mellitus with diabetic chronic kidney disease: Secondary | ICD-10-CM | POA: Diagnosis not present

## 2015-08-01 DIAGNOSIS — I129 Hypertensive chronic kidney disease with stage 1 through stage 4 chronic kidney disease, or unspecified chronic kidney disease: Secondary | ICD-10-CM | POA: Diagnosis not present

## 2015-08-01 DIAGNOSIS — Z79899 Other long term (current) drug therapy: Secondary | ICD-10-CM

## 2015-08-01 DIAGNOSIS — M810 Age-related osteoporosis without current pathological fracture: Secondary | ICD-10-CM | POA: Diagnosis not present

## 2015-08-01 DIAGNOSIS — Z87891 Personal history of nicotine dependence: Secondary | ICD-10-CM

## 2015-08-01 DIAGNOSIS — I509 Heart failure, unspecified: Secondary | ICD-10-CM | POA: Diagnosis not present

## 2015-08-01 DIAGNOSIS — Z7952 Long term (current) use of systemic steroids: Secondary | ICD-10-CM | POA: Diagnosis not present

## 2015-08-01 DIAGNOSIS — Z8249 Family history of ischemic heart disease and other diseases of the circulatory system: Secondary | ICD-10-CM | POA: Diagnosis not present

## 2015-08-01 DIAGNOSIS — I5031 Acute diastolic (congestive) heart failure: Secondary | ICD-10-CM

## 2015-08-01 DIAGNOSIS — Z825 Family history of asthma and other chronic lower respiratory diseases: Secondary | ICD-10-CM

## 2015-08-01 DIAGNOSIS — J449 Chronic obstructive pulmonary disease, unspecified: Secondary | ICD-10-CM | POA: Diagnosis not present

## 2015-08-01 DIAGNOSIS — Z9981 Dependence on supplemental oxygen: Secondary | ICD-10-CM

## 2015-08-01 LAB — TROPONIN I
Troponin I: 0.04 ng/mL — ABNORMAL HIGH (ref <0.031)
Troponin I: 0.04 ng/mL — ABNORMAL HIGH (ref <0.031)
Troponin I: 0.03 ng/mL (ref <0.031)
Troponin I: <0.03 ng/mL (ref <0.031)

## 2015-08-01 LAB — BASIC METABOLIC PANEL
Anion gap: 7 (ref 5–15)
BUN: 14 mg/dL (ref 6–20)
CO2: 29 mmol/L (ref 22–32)
Calcium: 8.5 mg/dL — ABNORMAL LOW (ref 8.9–10.3)
Chloride: 104 mmol/L (ref 101–111)
Creatinine, Ser: 1.44 mg/dL — ABNORMAL HIGH (ref 0.44–1.00)
GFR calc Af Amer: 39 mL/min — ABNORMAL LOW (ref >60)
GFR calc non Af Amer: 34 mL/min — ABNORMAL LOW (ref >60)
Glucose, Bld: 163 mg/dL — ABNORMAL HIGH (ref 65–99)
Potassium: 3.8 mmol/L (ref 3.5–5.1)
Sodium: 140 mmol/L (ref 135–145)

## 2015-08-01 LAB — CBC WITH DIFFERENTIAL/PLATELET
Basophils Absolute: 0 10*3/uL (ref 0.0–0.1)
Basophils Relative: 1 % (ref 0–1)
Eosinophils Absolute: 0.3 10*3/uL (ref 0.0–0.7)
Eosinophils Relative: 4 % (ref 0–5)
HCT: 36.6 % (ref 36.0–46.0)
Hemoglobin: 11.3 g/dL — ABNORMAL LOW (ref 12.0–15.0)
Lymphocytes Relative: 22 % (ref 12–46)
Lymphs Abs: 1.8 10*3/uL (ref 0.7–4.0)
MCH: 28.8 pg (ref 26.0–34.0)
MCHC: 30.9 g/dL (ref 30.0–36.0)
MCV: 93.4 fL (ref 78.0–100.0)
Monocytes Absolute: 0.7 10*3/uL (ref 0.1–1.0)
Monocytes Relative: 9 % (ref 3–12)
Neutro Abs: 5.2 10*3/uL (ref 1.7–7.7)
Neutrophils Relative %: 64 % (ref 43–77)
Platelets: 194 10*3/uL (ref 150–400)
RBC: 3.92 MIL/uL (ref 3.87–5.11)
RDW: 17.1 % — ABNORMAL HIGH (ref 11.5–15.5)
WBC: 8.1 10*3/uL (ref 4.0–10.5)

## 2015-08-01 LAB — GLUCOSE, CAPILLARY
Glucose-Capillary: 168 mg/dL — ABNORMAL HIGH (ref 65–99)
Glucose-Capillary: 169 mg/dL — ABNORMAL HIGH (ref 65–99)
Glucose-Capillary: 168 mg/dL — ABNORMAL HIGH (ref 65–99)
Glucose-Capillary: 166 mg/dL — ABNORMAL HIGH (ref 65–99)
Glucose-Capillary: 185 mg/dL — ABNORMAL HIGH (ref 65–99)

## 2015-08-01 LAB — BRAIN NATRIURETIC PEPTIDE: B Natriuretic Peptide: 630 pg/mL — ABNORMAL HIGH (ref 0.0–100.0)

## 2015-08-01 LAB — TSH: TSH: 1.571 u[IU]/mL (ref 0.350–4.500)

## 2015-08-01 MED ORDER — GABAPENTIN 300 MG PO CAPS
300.0000 mg | ORAL_CAPSULE | Freq: Every day | ORAL | Status: DC
  Administered 2015-08-01 – 2015-08-02 (×2): 300 mg via ORAL
  Filled 2015-08-01 (×2): qty 1

## 2015-08-01 MED ORDER — ONDANSETRON HCL 4 MG/2ML IJ SOLN: 4.0000 mg | Freq: Four times a day (QID) | INTRAMUSCULAR | Status: DC | PRN

## 2015-08-01 MED ORDER — OMEGA-3-ACID ETHYL ESTERS 1 G PO CAPS
1.0000 g | ORAL_CAPSULE | Freq: Two times a day (BID) | ORAL | Status: DC
  Administered 2015-08-01 – 2015-08-02 (×3): 1 g via ORAL
  Filled 2015-08-01 (×3): qty 1

## 2015-08-01 MED ORDER — IPRATROPIUM-ALBUTEROL 0.5-2.5 (3) MG/3ML IN SOLN
3.0000 mL | Freq: Four times a day (QID) | RESPIRATORY_TRACT | Status: DC
  Administered 2015-08-01 – 2015-08-02 (×5): 3 mL via RESPIRATORY_TRACT
  Filled 2015-08-01 (×5): qty 3

## 2015-08-01 MED ORDER — ASPIRIN 81 MG PO CHEW
324.0000 mg | CHEWABLE_TABLET | Freq: Once | ORAL | Status: AC
  Administered 2015-08-01: 324 mg via ORAL
  Filled 2015-08-01: qty 4

## 2015-08-01 MED ORDER — PANTOPRAZOLE SODIUM 40 MG PO TBEC
40.0000 mg | DELAYED_RELEASE_TABLET | Freq: Every day | ORAL | Status: DC
  Administered 2015-08-01 – 2015-08-02 (×2): 40 mg via ORAL
  Filled 2015-08-01 (×2): qty 1

## 2015-08-01 MED ORDER — PREDNISONE 50 MG PO TABS
60.0000 mg | ORAL_TABLET | Freq: Once | ORAL | Status: AC
  Administered 2015-08-01: 60 mg via ORAL
  Filled 2015-08-01 (×2): qty 1

## 2015-08-01 MED ORDER — ASPIRIN EC 81 MG PO TBEC
81.0000 mg | DELAYED_RELEASE_TABLET | Freq: Every day | ORAL | Status: DC
  Administered 2015-08-01 – 2015-08-02 (×2): 81 mg via ORAL
  Filled 2015-08-01 (×2): qty 1

## 2015-08-01 MED ORDER — ROPINIROLE HCL 0.25 MG PO TABS
0.2500 mg | ORAL_TABLET | Freq: Three times a day (TID) | ORAL | Status: DC
  Administered 2015-08-01 – 2015-08-02 (×5): 0.25 mg via ORAL
  Filled 2015-08-01 (×7): qty 1

## 2015-08-01 MED ORDER — IPRATROPIUM-ALBUTEROL 0.5-2.5 (3) MG/3ML IN SOLN
3.0000 mL | RESPIRATORY_TRACT | Status: DC | PRN
  Administered 2015-08-01: 3 mL via RESPIRATORY_TRACT
  Filled 2015-08-01: qty 3

## 2015-08-01 MED ORDER — FUROSEMIDE 10 MG/ML IJ SOLN
40.0000 mg | Freq: Every day | INTRAMUSCULAR | Status: DC
  Administered 2015-08-01 – 2015-08-02 (×2): 40 mg via INTRAVENOUS
  Filled 2015-08-01 (×2): qty 4

## 2015-08-01 MED ORDER — CETYLPYRIDINIUM CHLORIDE 0.05 % MT LIQD
7.0000 mL | Freq: Two times a day (BID) | OROMUCOSAL | Status: DC
  Administered 2015-08-01 – 2015-08-02 (×3): 7 mL via OROMUCOSAL

## 2015-08-01 MED ORDER — PREDNISONE 20 MG PO TABS
20.0000 mg | ORAL_TABLET | Freq: Every day | ORAL | Status: DC
  Administered 2015-08-01 – 2015-08-02 (×2): 20 mg via ORAL
  Filled 2015-08-01 (×2): qty 1

## 2015-08-01 MED ORDER — METOPROLOL SUCCINATE ER 25 MG PO TB24
25.0000 mg | ORAL_TABLET | Freq: Every day | ORAL | Status: DC
  Administered 2015-08-01 – 2015-08-02 (×2): 25 mg via ORAL
  Filled 2015-08-01 (×2): qty 1

## 2015-08-01 MED ORDER — ACETAMINOPHEN 325 MG PO TABS: 650.0000 mg | ORAL_TABLET | Freq: Four times a day (QID) | ORAL | Status: DC | PRN

## 2015-08-01 MED ORDER — LINAGLIPTIN 5 MG PO TABS
5.0000 mg | ORAL_TABLET | Freq: Every day | ORAL | Status: DC
  Administered 2015-08-01 – 2015-08-02 (×2): 5 mg via ORAL
  Filled 2015-08-01 (×2): qty 1

## 2015-08-01 MED ORDER — FUROSEMIDE 10 MG/ML IJ SOLN
40.0000 mg | Freq: Once | INTRAMUSCULAR | Status: AC
  Administered 2015-08-01: 40 mg via INTRAVENOUS
  Filled 2015-08-01: qty 4

## 2015-08-01 MED ORDER — ALENDRONATE SODIUM 70 MG PO TABS: 70.0000 mg | ORAL_TABLET | ORAL | Status: DC

## 2015-08-01 MED ORDER — ALPRAZOLAM 0.25 MG PO TABS
0.2500 mg | ORAL_TABLET | Freq: Two times a day (BID) | ORAL | Status: DC
  Administered 2015-08-01 – 2015-08-02 (×3): 0.25 mg via ORAL
  Filled 2015-08-01 (×3): qty 1

## 2015-08-01 MED ORDER — ALBUTEROL SULFATE (2.5 MG/3ML) 0.083% IN NEBU: 3.0000 mL | INHALATION_SOLUTION | RESPIRATORY_TRACT | Status: DC | PRN

## 2015-08-01 MED ORDER — INSULIN ASPART 100 UNIT/ML ~~LOC~~ SOLN: 0.0000 [IU] | Freq: Every day | SUBCUTANEOUS | Status: DC

## 2015-08-01 MED ORDER — LOSARTAN POTASSIUM 50 MG PO TABS
50.0000 mg | ORAL_TABLET | Freq: Every day | ORAL | Status: DC
  Administered 2015-08-01 – 2015-08-02 (×2): 50 mg via ORAL
  Filled 2015-08-01 (×2): qty 1

## 2015-08-01 MED ORDER — BUPROPION HCL ER (SR) 150 MG PO TB12
150.0000 mg | ORAL_TABLET | Freq: Two times a day (BID) | ORAL | Status: DC
  Administered 2015-08-01 – 2015-08-02 (×3): 150 mg via ORAL
  Filled 2015-08-01 (×5): qty 1

## 2015-08-01 MED ORDER — OMEGA-3 FATTY ACIDS 1000 MG PO CAPS: 1.0000 g | ORAL_CAPSULE | Freq: Two times a day (BID) | ORAL | Status: DC

## 2015-08-01 MED ORDER — INSULIN ASPART 100 UNIT/ML ~~LOC~~ SOLN
0.0000 [IU] | Freq: Three times a day (TID) | SUBCUTANEOUS | Status: DC
  Administered 2015-08-01 (×3): 3 [IU] via SUBCUTANEOUS
  Administered 2015-08-02: 2 [IU] via SUBCUTANEOUS
  Administered 2015-08-02 (×2): 3 [IU] via SUBCUTANEOUS

## 2015-08-01 MED ORDER — ONDANSETRON HCL 4 MG PO TABS: 4.0000 mg | ORAL_TABLET | Freq: Four times a day (QID) | ORAL | Status: DC | PRN

## 2015-08-01 MED ORDER — ACETAMINOPHEN 650 MG RE SUPP: 650.0000 mg | Freq: Four times a day (QID) | RECTAL | Status: DC | PRN

## 2015-08-01 MED ORDER — VITAMIN B-6 50 MG PO TABS
100.0000 mg | ORAL_TABLET | Freq: Every day | ORAL | Status: DC
  Administered 2015-08-01 – 2015-08-02 (×2): 100 mg via ORAL
  Filled 2015-08-01 (×2): qty 2

## 2015-08-01 MED ORDER — IPRATROPIUM-ALBUTEROL 0.5-2.5 (3) MG/3ML IN SOLN
3.0000 mL | Freq: Once | RESPIRATORY_TRACT | Status: AC
  Administered 2015-08-01: 3 mL via RESPIRATORY_TRACT
  Filled 2015-08-01: qty 3

## 2015-08-01 MED ORDER — SODIUM CHLORIDE 0.9 % IJ SOLN
3.0000 mL | Freq: Two times a day (BID) | INTRAMUSCULAR | Status: DC
  Administered 2015-08-01 – 2015-08-02 (×3): 3 mL via INTRAVENOUS

## 2015-08-01 MED ORDER — HEPARIN SODIUM (PORCINE) 5000 UNIT/ML IJ SOLN
5000.0000 [IU] | Freq: Three times a day (TID) | INTRAMUSCULAR | Status: DC
  Administered 2015-08-01 – 2015-08-02 (×5): 5000 [IU] via SUBCUTANEOUS
  Filled 2015-08-01 (×4): qty 1

## 2015-08-01 NOTE — ED Notes (Signed)
Pt c/o sob and dizziness x 2 days. Pt is continuous oxygen at 3lpm

## 2015-08-01 NOTE — Care Management Note (Signed)
Case Management Note  Patient Details  Name: Angela Lawson MRN: [id number] Date of Birth: 11-02-1934  Subjective/Objective:                  Pt admitted from home with CHF. Pt lives with her daughter and will return home at discharge. Pt is fairly independent with ADL's. Pt has home O2 with [facility] and a neb machine.  Action/Plan: Will continue to follow for discharge planning needs.  Expected Discharge Date:  08/03/15               Expected Discharge Plan:  Home/Self Care  In-House Referral:  NA  Discharge planning Services  CM Consult  Post Acute Care Choice:  NA Choice offered to:  NA  DME Arranged:    DME Agency:     HH Arranged:    HH Agency:     Status of Service:  Completed, signed off  Medicare Important Message Given:    Date Medicare IM Given:    Medicare IM give by:    Date Additional Medicare IM Given:    Additional Medicare Important Message give by:     If discussed at [place] of Stay Meetings, dates discussed:    Additional Comments:  Joylene Draft, RN 08/01/2015, 11:56 AM

## 2015-08-01 NOTE — H&P (Signed)
[facility] History and Physical  ASJA FROMMER VFI:[id number] DOB: Jun 20, 1934    PCP:   Rosita Fire, MD   Chief Complaint: Progressive SOB over the past week.   HPI: Angela Lawson is an 81 year-old female who  has a past medical history of lung cancer; Essential hypertension; Arthritis; Diverticulitis; Type II diabetes mellitus; H/O ventral hernia; COPD (chronic obstructive pulmonary disease); Osteoporosis; Adenocarcinoma of lung  Non-obstructive CAD; Takotsubo cardiomyopathy; Dyslipidemia; and Ventricular bigeminy, recent admission for COPD exacerbation on Prednisone slow taper, presented to the ER with 5 days hx of orthopnea, SOB, lightheadedness with ambulation, increase oxygen requirement from 2L baseline to 2.5 L [place], with no fever, chills, nausea, vomiting or CP.  She had a cardiac cath 04/2015 showing non obstructive CAD, and EF 35-40%.  Evaluation in the ER showed troponin of 0.04, EKG showed ventricular Bigeminy, with no acute ST T changes.  Her CXR showed pulmonary edema, and BNP was elevated to 630.  Her Cr was elevated to 1.44. She has no fever or leukocytosis, and no report of productive coughs.  She was given IV Lasix, diuresed some, and felt better.  Hospitalist was asked to admit her for CHF.   Rewiew of Systems:  Constitutional: Negative for malaise, fever and chills. No significant weight loss or weight gain Eyes: Negative for eye pain, redness and discharge, diplopia, visual changes, or flashes of light. ENMT: Negative for ear pain, hoarseness, nasal congestion, sinus pressure and sore throat. No headaches; tinnitus, drooling, or problem swallowing. Cardiovascular: Negative for chest pain, palpitations, diaphoresis,  and peripheral edema. ;  Respiratory: Negative for cough, hemoptysis, and stridor. No pleuritic chestpain. Gastrointestinal: Negative for nausea, vomiting, diarrhea, constipation, abdominal pain, melena, blood in stool, hematemesis, jaundice and rectal  bleeding.    Genitourinary: Negative for frequency, dysuria, incontinence,flank pain and hematuria; Musculoskeletal: Negative for back pain and neck pain. Negative for swelling and trauma.;  Skin: . Negative for pruritus, rash, abrasions, bruising and skin lesion.; ulcerations Neuro: Negative for headache, lightheadedness and neck stiffness. Negative for weakness, altered level of consciousness , altered mental status, extremity weakness, burning feet, involuntary movement, seizure and syncope.  Psych: negative for anxiety, depression, insomnia, tearfulness, panic attacks, hallucinations, paranoia, suicidal or homicidal ideation    Past Medical History  Diagnosis Date  . Cancer     left lung/ 2009/ surg only  . Essential hypertension   . Arthritis   . Diverticulitis   . Type II diabetes mellitus   . H/O ventral hernia   . COPD (chronic obstructive pulmonary disease)   . Osteoporosis   . Adenocarcinoma of lung 08/20/2012  . Non-obstructive CAD     a. 04/2015 NSTEMI/Cath: LAD 10p, LCX 24m RCA 367m20d, EF 35-40 w/ apical ballooning.  . Takotsubo cardiomyopathy     a. 04/2015 Echo: EF 45-50%, mid-dist anterior/apical/inferoapical HK w/ hyperdynamic base. Gr 1 DD, mild AI, mild-mod MR, triv TR, PASP 4870m;  b. 04/2015 LV gram: Ef 35-40% w/ apical ballooning.  . DMarland Kitchenslipidemia   . Ventricular bigeminy     a. 04/2015 in setting of NSTEMI/Takotsubo.    Past Surgical History  Procedure Laterality Date  . Cholecystectomy    . Ectopic pregnancy surgery    . Abdominal hysterectomy    . Lung cancer surgery    . Incisional hernia repair N/A 08/26/2013    Procedure: HERNIA REPAIR INCISIONAL WITH MESH;  Surgeon: MarJamesetta SoD;  Location: [facility];  Service: General;  Laterality: N/A;  . Colonoscopy  N/A 09/18/2014    Procedure: COLONOSCOPY;  Surgeon: Danie Binder, MD;  Location: [facility];  Service: Endoscopy;  Laterality: N/A;  8:30 AM - moved to 10:30 Rosendo Gros to notify pt  . Cardiac  catheterization N/A 04/17/2015    Procedure: Left Heart Cath and Coronary Angiography;  Surgeon: Troy Sine, MD;  Location: [facility];  Service: Cardiovascular;  Laterality: N/A;    Medications:  HOME MEDS: Prior to Admission medications   Medication Sig Start Date End Date Taking? Authorizing Provider  albuterol (PROAIR HFA) 108 (90 BASE) MCG/ACT inhaler Inhale 2 puffs into the lungs every 4 (four) hours as needed for wheezing or shortness of breath.    Historical Provider, MD  alendronate (FOSAMAX) 70 MG tablet Take 70 mg by mouth once a week. On Fridays 03/14/15   Historical Provider, MD  ALPRAZolam Duanne Moron) 0.25 MG tablet Take 0.25 mg by mouth 2 (two) times daily. 04/30/15   Historical Provider, MD  aspirin EC 81 MG tablet Take 81 mg by mouth daily.    Historical Provider, MD  buPROPion (WELLBUTRIN SR) 150 MG 12 hr tablet Take 150 mg by mouth 2 (two) times daily.    Historical Provider, MD  Calcium Carbonate-Vitamin D (CALCIUM 600 + D PO) Take 1 tablet by mouth daily.    Historical Provider, MD  Cholecalciferol (VITAMIN D-3) 1000 UNITS CAPS Take 1 capsule by mouth daily.    Historical Provider, MD  doxycycline (VIBRAMYCIN) 50 MG capsule Take 1 capsule (50 mg total) by mouth 2 (two) times daily. 07/11/15   Rosita Fire, MD  fish oil-omega-3 fatty acids 1000 MG capsule Take 1 g by mouth 2 (two) times daily.     Historical Provider, MD  gabapentin (NEURONTIN) 300 MG capsule Take 300 mg by mouth daily.     Historical Provider, MD  Garlic 381 MG TABS Take 300 mg by mouth daily.    Historical Provider, MD  ipratropium-albuterol (DUONEB) 0.5-2.5 (3) MG/3ML SOLN Inhale 3 mLs into the lungs every 4 (four) hours as needed (wheezing/shortness of breath).  09/05/14   Historical Provider, MD  linagliptin (TRADJENTA) 5 MG TABS tablet Take 5 mg by mouth daily.    Historical Provider, MD  losartan (COZAAR) 50 MG tablet Take 50 mg by mouth daily. 06/29/15   Historical Provider, MD  meclizine  (ANTIVERT) 25 MG tablet Take 1 tablet by mouth 3 (three) times daily as needed for dizziness.  09/05/14   Historical Provider, MD  metoprolol succinate (TOPROL-XL) 25 MG 24 hr tablet Take 1 tablet (25 mg total) by mouth daily. 06/26/15   Jettie Booze, MD  nitroGLYCERIN (NITROSTAT) 0.4 MG SL tablet Place 1 tablet (0.4 mg total) under the tongue every 5 (five) minutes as needed for chest pain. 04/20/15   Eileen Stanford, PA-C  omeprazole (PRILOSEC) 20 MG capsule Take 20 mg by mouth daily.    Historical Provider, MD  predniSONE (DELTASONE) 10 MG tablet 40 mg po daily for 3 days, then 30 mg po daily for 3 days, then 20 mg po daily for 3 days then 1 tab daily for 3 days then stop. 07/11/15   Rosita Fire, MD  pyridoxine (B-6) 100 MG tablet Take 100 mg by mouth daily.    Historical Provider, MD  rOPINIRole (REQUIP) 0.25 MG tablet Take 0.25 mg by mouth 3 (three) times daily.  09/05/14   Historical Provider, MD     Allergies:  Allergies  Allergen Reactions  . Lisinopril  Cough    Social History:   reports that she has quit smoking. She has never used smokeless tobacco. She reports that she does not drink alcohol or use illicit drugs.  Family History: Family History  Problem Relation Age of Onset  . Diabetes Mother   . Hypertension Mother   . Diabetes Father   . Asthma    . Cancer    . Colon cancer Neg Hx      Physical Exam: Filed Vitals:   08/01/15 0108 08/01/15 0110 08/01/15 0137 08/01/15 0230  BP:    106/75  Pulse:  73 79 50  Temp:      Resp:  '19 20 21  '$ Height:      Weight:      SpO2: 96% 99% 100% 81%   Blood pressure 106/75, pulse 50, temperature 98.2 F (36.8 C), resp. rate 21, height '5\' 4"'$  (1.626 m), weight 82.101 kg (181 lb), SpO2 81 %.  GEN:  Pleasant  patient lying in the stretcher in no acute distress; cooperative with exam. PSYCH:  alert and oriented x4; does not appear anxious or depressed; affect is appropriate. HEENT: Mucous membranes pink and anicteric;  PERRLA; EOM intact; no cervical lymphadenopathy nor thyromegaly or carotid bruit; no JVD; There were no stridor. Neck is very supple. Breasts:: Not examined CHEST WALL: No tenderness CHEST: Normal respiration, bilateral rales at both bases, right is greater than left.  HEART: Regular rate and rhythm.  There are no murmur, rub, or gallops.   BACK: No kyphosis or scoliosis; no CVA tenderness ABDOMEN: soft and non-tender; no masses, no organomegaly, normal abdominal bowel sounds; no pannus; no intertriginous candida. There is no rebound and no distention. Rectal Exam: Not done EXTREMITIES: No bone or joint deformity; age-appropriate arthropathy of the hands and knees; 1-2+ edema; no ulcerations.  There is no calf tenderness. Genitalia: not examined PULSES: 2+ and symmetric SKIN: Normal hydration no rash or ulceration CNS: Cranial nerves 2-12 grossly intact no focal lateralizing neurologic deficit.  Speech is fluent; uvula elevated with phonation, facial symmetry and tongue midline. DTR are normal bilaterally, cerebella exam is intact, barbinski is negative and strengths are equaled bilaterally.  No sensory loss.   Labs on Admission:  Basic Metabolic Panel:  Recent Labs Lab 08/01/15 0100  NA 140  K 3.8  CL 104  CO2 29  GLUCOSE 163*  BUN 14  CREATININE 1.44*  CALCIUM 8.5*   CBC:  Recent Labs Lab 08/01/15 0100  WBC 8.1  NEUTROABS 5.2  HGB 11.3*  HCT 36.6  MCV 93.4  PLT 194   Cardiac Enzymes:  Recent Labs Lab 08/01/15 0100  TROPONINI 0.04*    CBG:  Radiological Exams on Admission: Dg Chest 2 View  08/01/2015   CLINICAL DATA:  Increasing shortness of breath tonight. History of left lung cancer.  EXAM: CHEST  2 VIEW  COMPARISON:  07/08/2015  FINDINGS: Cardiomediastinal contours are unchanged. There is pulmonary edema. Volume loss in the left hemithorax consistent with postsurgical change. Mild bibasilar atelectasis, no confluent airspace disease. Underlying hyperinflation  emphysema again seen. No pleural effusion or pneumothorax.  IMPRESSION: Development of pulmonary edema. Postsurgical change in the left hemithorax again seen.   Electronically Signed   By: Jeb Levering M.D.   On: 08/01/2015 01:45    EKG: Independently reviewed.    Assessment/Plan Present on Admission:  . CKD (chronic kidney disease) stage 3, GFR 30-59 ml/min . Dyslipidemia . Essential hypertension . Stress-induced cardiomyopathy  PLAN:  Will admit  her to Dr Josephine Cables service as per prior arrangement for acute on chronic diastolic CHF, with evidence of volume overload and pulmonary edema.  She does have CKD, and her Cr is currently at 1.4.  Will continue to diurese her carefully, obtaining basic metabolic profile daily.  She is feeling better, and will continue with her current meds.  She was recently admitted for COPD exacerbation, but her DOE is likely cardiogenic this time. Would continue with her taper steroids.  She is stable, full code, and will be admitted to telemetry.  Thank you and Good day.   Other plans as per orders.  Code Status:FULL CODE.   Orvan Falconer, MD. [facility] Pager [phone] 7pm to 7am.  08/01/2015, 3:48 AM

## 2015-08-01 NOTE — Consult Note (Signed)
Reason for Consult:CHF Referring Physician: Dr. Legrand Rams Cardiologist: Dr. Irish Lack (lives in [place], [place] and would like to be followed in [place] office) Consulting Cardiologist: Dr. Karma Ganja CLARE FENNIMORE is an 81 y.o. female patient of Dr. Irish Lack with history of Takotsubo cardiomyopathy in 04/2015. Cath showed non obstructive CAD, EF 35-40%. EF 40-45% on echo 04/2015. F/U echo 06/04/15 EF normalized, diastolic dysfunction. Patient also has COPD on home O2, lung CA S/P left partial pneumonectomy 2009, CKD stage 3, ventricular bigeminy, HTN, dyslipidemia, DM II. Patient saw Dr. Irish Lack 06/04/15 and doing well. O2 sats 89% on RA, managed by Dr. Luan Pulling.  She now returns with 5-10 day history of recurrent dyspnea, orthopnea, and increased O2 requirements and 10 lb weight gain. She says she would get short of breath with little activity and her heart would race. She denies any chest pain or pressure. She had some dizziness, but no syncope. She says she has been watching her salt closely and doesn't think she has gotten excess. She does drink a pepsi everyday despite being diabetic.Troponins flat at 0.04, BNP 630, CXR-pulmonary edema.EKG NSR with frequent PVC's, nonspecific ST changes.She had an admission earlier this month with COPD exacerbation and is on a steroid taper.  Past Medical History  Diagnosis Date  . Essential hypertension   . Arthritis   . Diverticulitis   . Type II diabetes mellitus   . H/O ventral hernia   . COPD (chronic obstructive pulmonary disease)   . Osteoporosis   . Adenocarcinoma of lung     Left lung 2009, resected  . Non-obstructive CAD     a. 04/2015 NSTEMI/Cath: LAD 10p, LCX 53m RCA 389m20d, EF 35-40 w/ apical ballooning.  . Takotsubo cardiomyopathy     a. 04/2015 Echo: EF 45-50%, mid-dist anterior/apical/inferoapical HK w/ hyperdynamic base. Gr 1 DD, mild AI, mild-mod MR, triv TR, PASP 4847m;  b. 04/2015 LV gram: Ef 35-40% w/ apical ballooning.  .  Marland Kitchenyslipidemia   . Ventricular bigeminy     a. 04/2015 in setting of NSTEMI/Takotsubo.    Past Surgical History  Procedure Laterality Date  . Cholecystectomy    . Ectopic pregnancy surgery    . Abdominal hysterectomy    . Lung cancer surgery    . Incisional hernia repair N/A 08/26/2013    Procedure: HERNIA REPAIR INCISIONAL WITH MESH;  Surgeon: MarJamesetta SoD;  Location: [facility];  Service: General;  Laterality: N/A;  . Colonoscopy N/A 09/18/2014    Procedure: COLONOSCOPY;  Surgeon: SanDanie BinderD;  Location: [facility];  Service: Endoscopy;  Laterality: N/A;  8:30 AM - moved to 10:30 - CRosendo Gros notify pt  . Cardiac catheterization N/A 04/17/2015    Procedure: Left Heart Cath and Coronary Angiography;  Surgeon: ThoTroy SineD;  Location: [facility];  Service: Cardiovascular;  Laterality: N/A;    Family History  Problem Relation Age of Onset  . Diabetes Mother   . Hypertension Mother   . Diabetes Father   . Asthma    . Cancer    . Colon cancer Neg Hx     Social History:  reports that she has quit smoking. Her smoking use included Cigarettes. She has never used smokeless tobacco. She reports that she does not drink alcohol or use illicit drugs.  Allergies:  Allergies  Allergen Reactions  . Lisinopril Cough    Medications:  Scheduled Meds: . ALPRAZolam  0.25 mg Oral BID  .  antiseptic oral rinse  7 mL Mouth Rinse BID  . aspirin EC  81 mg Oral Daily  . buPROPion  150 mg Oral BID  . furosemide  40 mg Intravenous Daily  . gabapentin  300 mg Oral Daily  . heparin  5,000 Units Subcutaneous 3 times per day  . insulin aspart  0-15 Units Subcutaneous TID WC  . insulin aspart  0-5 Units Subcutaneous QHS  . linagliptin  5 mg Oral Daily  . losartan  50 mg Oral Daily  . metoprolol succinate  25 mg Oral Daily  . omega-3 acid ethyl esters  1 g Oral BID  . pantoprazole  40 mg Oral Daily  . predniSONE  20 mg Oral Q breakfast  . pyridoxine  100 mg Oral Daily  .  rOPINIRole  0.25 mg Oral TID  . sodium chloride  3 mL Intravenous Q12H   Continuous Infusions:  PRN Meds:.acetaminophen **OR** acetaminophen, albuterol, ipratropium-albuterol, ondansetron **OR** ondansetron (ZOFRAN) IV   Results for orders placed or performed during the hospital encounter of 08/01/15 (from the past 48 hour(s))  Basic metabolic panel     Status: Abnormal   Collection Time: 08/01/15  1:00 AM  Result Value Ref Range   Sodium 140 135 - 145 mmol/L   Potassium 3.8 3.5 - 5.1 mmol/L   Chloride 104 101 - 111 mmol/L   CO2 29 22 - 32 mmol/L   Glucose, Bld 163 (H) 65 - 99 mg/dL   BUN 14 6 - 20 mg/dL   Creatinine, Ser 1.44 (H) 0.44 - 1.00 mg/dL   Calcium 8.5 (L) 8.9 - 10.3 mg/dL   GFR calc non Af Amer 34 (L) >60 mL/min   GFR calc Af Amer 39 (L) >60 mL/min    Comment: (NOTE) The eGFR has been calculated using the CKD EPI equation. This calculation has not been validated in all clinical situations. eGFR's persistently <60 mL/min signify possible Chronic Kidney Disease.    Anion gap 7 5 - 15  CBC with Differential/Platelet     Status: Abnormal   Collection Time: 08/01/15  1:00 AM  Result Value Ref Range   WBC 8.1 4.0 - 10.5 K/uL   RBC 3.92 3.87 - 5.11 MIL/uL   Hemoglobin 11.3 (L) 12.0 - 15.0 g/dL   HCT 36.6 36.0 - 46.0 %   MCV 93.4 78.0 - 100.0 fL   MCH 28.8 26.0 - 34.0 pg   MCHC 30.9 30.0 - 36.0 g/dL   RDW 17.1 (H) 11.5 - 15.5 %   Platelets 194 150 - 400 K/uL   Neutrophils Relative % 64 43 - 77 %   Neutro Abs 5.2 1.7 - 7.7 K/uL   Lymphocytes Relative 22 12 - 46 %   Lymphs Abs 1.8 0.7 - 4.0 K/uL   Monocytes Relative 9 3 - 12 %   Monocytes Absolute 0.7 0.1 - 1.0 K/uL   Eosinophils Relative 4 0 - 5 %   Eosinophils Absolute 0.3 0.0 - 0.7 K/uL   Basophils Relative 1 0 - 1 %   Basophils Absolute 0.0 0.0 - 0.1 K/uL  Brain natriuretic peptide     Status: Abnormal   Collection Time: 08/01/15  1:00 AM  Result Value Ref Range   B Natriuretic Peptide 630.0 (H) 0.0 - 100.0  pg/mL  Troponin I     Status: Abnormal   Collection Time: 08/01/15  1:00 AM  Result Value Ref Range   Troponin I 0.04 (H) <0.031 ng/mL    Comment:  PERSISTENTLY INCREASED TROPONIN VALUES IN THE RANGE OF 0.04-0.49 ng/mL CAN BE SEEN IN:       -UNSTABLE ANGINA       -CONGESTIVE HEART FAILURE       -MYOCARDITIS       -CHEST TRAUMA       -ARRYHTHMIAS       -LATE PRESENTING MYOCARDIAL INFARCTION       -COPD   CLINICAL FOLLOW-UP RECOMMENDED.   TSH     Status: None   Collection Time: 08/01/15  1:00 AM  Result Value Ref Range   TSH 1.571 0.350 - 4.500 uIU/mL  Troponin I     Status: Abnormal   Collection Time: 08/01/15  5:00 AM  Result Value Ref Range   Troponin I 0.04 (H) <0.031 ng/mL    Comment:        PERSISTENTLY INCREASED TROPONIN VALUES IN THE RANGE OF 0.04-0.49 ng/mL CAN BE SEEN IN:       -UNSTABLE ANGINA       -CONGESTIVE HEART FAILURE       -MYOCARDITIS       -CHEST TRAUMA       -ARRYHTHMIAS       -LATE PRESENTING MYOCARDIAL INFARCTION       -COPD   CLINICAL FOLLOW-UP RECOMMENDED.   Glucose, capillary     Status: Abnormal   Collection Time: 08/01/15  5:11 AM  Result Value Ref Range   Glucose-Capillary 168 (H) 65 - 99 mg/dL   Comment 1 Notify RN    Comment 2 Document in Chart   Glucose, capillary     Status: Abnormal   Collection Time: 08/01/15  7:27 AM  Result Value Ref Range   Glucose-Capillary 169 (H) 65 - 99 mg/dL   Comment 1 Notify RN     Dg Chest 2 View  08/01/2015   CLINICAL DATA:  Increasing shortness of breath tonight. History of left lung cancer.  EXAM: CHEST  2 VIEW  COMPARISON:  07/08/2015  FINDINGS: Cardiomediastinal contours are unchanged. There is pulmonary edema. Volume loss in the left hemithorax consistent with postsurgical change. Mild bibasilar atelectasis, no confluent airspace disease. Underlying hyperinflation emphysema again seen. No pleural effusion or pneumothorax.  IMPRESSION: Development of pulmonary edema. Postsurgical change in  the left hemithorax again seen.   Electronically Signed   By: Jeb Levering M.D.   On: 08/01/2015 01:45    ROS  See HPI Eyes: Negative Ears:Negative for hearing loss, tinnitus Cardiovascular: Negative for chest pain,positive for palpitations,irregular heartbeat, dyspnea, dyspnea on exertion, orthopnea, paroxysmal nocturnal dyspnea and negative syncope,edema, claudication, cyanosis,.  Respiratory:   Positive for cough,  shortness of breath, sleep disturbances due to breathing.   Endocrine: Negative for cold intolerance and heat intolerance.  Hematologic/Lymphatic: Negative for adenopathy and bleeding problem. Does not bruise/bleed easily.  Musculoskeletal: Negative.   Gastrointestinal: Negative for nausea, vomiting, reflux, abdominal pain, diarrhea, constipation.   Genitourinary: Negative for bladder incontinence, dysuria, flank pain, frequency, hematuria, hesitancy, nocturia and urgency.  Neurological: Negative.  Allergic/Immunologic: Negative for environmental allergies.  Blood pressure 155/76, pulse 82, temperature 97.9 F (36.6 C), temperature source Oral, resp. rate 17, height '5\' 4"'  (1.626 m), weight 181 lb (82.101 kg), SpO2 98 %. Physical Exam PHYSICAL EXAM: Well-nournished, in no acute distress, on O2. Neck: slight increase JVD, HJR,no Bruit, or thyroid enlargement Lungs:Decreased breath sounds left lung, rales at right base Cardiovascular: RRR with some skipping, 1/6 systolic murmur LSB, no bruit, thrill, or heave. Abdomen: BS normal. Soft without organomegaly,  masses, lesions or tenderness. Extremities: race of edema, without cyanosis, clubbing. Good distal pulses bilateral SKin: Warm, no lesions or rashes  Musculoskeletal: No deformities Neuro: no focal signs  Cath 04/17/15:  Conclusion      Prox LAD lesion, 10% stenosed.  Mid Cx lesion, 20% stenosed.  Mid RCA lesion, 30% stenosed.  Dist RCA lesion, 20% stenosed.   Moderately severe left ventricular dysfunction  with severe hypo-to akinesis involving the mid distal anterolateral wall apex and distal inferior wall in a pattern suggestive of possible Takotsubu cardiomyopathy.  Global ejection fraction is 35-40%.  There is vigorous contractility of the basal walls.  Mild nonobstructive CAD with smooth 10% proximal LAD stenosis, 20% proximal circumflex stenosis, and 20-30% distal RCA stenoses.  RECOMMENDATION: Titration of medical therapy for the patient's cardiomyopathy.  The patient has evolved T-wave abnormalities anterolaterally.  Although the pattern is suggestive of Takotsubu cardiomyopathy, possible transient LAD vasospasm cannot be excluded.       2Decho 06/04/15: Study Conclusions  - Left ventricle: The cavity size was normal. There was mild   concentric hypertrophy. Systolic function was normal. The   estimated ejection fraction was in the range of 55% to 60%. Wall   motion was normal; there were no regional wall motion   abnormalities. Doppler parameters are consistent with abnormal   left ventricular relaxation (grade 1 diastolic dysfunction). - Aortic valve: There was trivial regurgitation. - Pulmonary arteries: Systolic pressure was mildly increased. PA   peak pressure: 35 mm Hg (S). - Pericardium, extracardiac: A trivial pericardial effusion was   identified posterior to the heart.  Impressions:  - Compared to May 10. 2016, wall motion and LV systolic function   are now normal.   Assessment/Plan: Acute on chronic diastolic CHF: will need to check echo and make sure LV function has remained normal. Has diuresed 2025 cc. Difficult to determine what precipitated this. No chest pain or acute EKG changes,BP not particularly high, troponins flat.Dietary consult.Responding to Lasix 40 mg IV. Patient would like to f/u in [place] office because of convenience at discharge.  Takotsubo cardiomyopathy 04/2015 with nonobstructive CAD at cath EF 35-40% ? LAD vasospasm, 40-45% on echo,  normalized on echo 06/04/15 EF 01-75%, diastolic dysfunction  COPD on home O2  History of Lung CA partial pneumonectomy 2009  HTN: on Cozaar and Toprol  DM II  CKD stage 3  Dyslipidemia  Ventricular ectopy on Toprol.  Ermalinda Barrios PA-C  08/01/2015, 9:33 AM    Attending note:  Patient seen and examined. Reviewed records and discussed the case with Ms. Bonnell Public PA-C. Ms. Arline presents with a one to two-week history of increasing weight gain that has been gradual culminating in increasing shortness of breath and fatigue with exertion. She reports no major change in her diet or sodium intake, also indicates compliance with her medications. She did have a hospitalization within a month for COPD exacerbation and is on a steroid taper at this time. She is admitted now with congestive heart failure, suspected diastolic in light of recent history.  Her history includes NSTEMI with Takotsubo cardiomyopathy in May of this year, nonobstructive CAD documented at cardiac catheterization. Echocardiogram in June showed normalization of LVEF with grade 1 diastolic dysfunction.  She has been placed on IV Lasix with good diuresis so far and feeling better symptomatically. On examination lungs exhibit decreased breath sounds with a few scattered crackles but nonlabored breathing at rest, cardiac exam reveals RRR without gallop, extremities show trace ankle edema.  Plan  is to obtain a follow-up echocardiogram to exclude decline in LVEF. If stable, anticipate placing her on an oral diuretic at home, which she was not on previously, also reinforce dietary and sodium restriction. Continue beta blocker and ARB. Precipitant is not clear, however as mentioned above, she is on a steroid taper which could have potentially contributed to her holding onto additional fluid.  Satira Sark, M.D., F.A.C.C.

## 2015-08-01 NOTE — ED Notes (Signed)
Pt up to [facility] and back to bed, pt having dyspnea after using BSC

## 2015-08-01 NOTE — ED Notes (Signed)
Report given to [facility]

## 2015-08-01 NOTE — Progress Notes (Signed)
Subjective: Patient was admitted due to shortness of breath. Her chest x-ray showed pulmonary congestion. Patient Echo in June which showed grad 1 diastolic dysfunction. Her BNP is slightly elevated. Patient is being diuresed and she feels slightly better today.  Objective: Vital signs in last 24 hours: Temp:  [97.9 F (36.6 C)-98.2 F (36.8 C)] 97.9 F (36.6 C) (08/24 0436) Pulse Rate:  [50-93] 82 (08/24 0436) Resp:  [17-29] 17 (08/24 0400) BP: (106-155)/(57-96) 155/76 mmHg (08/24 0436) SpO2:  [81 %-100 %] 98 % (08/24 0436) Weight:  [82.101 kg (181 lb)] 82.101 kg (181 lb) (08/24 0042) Weight change:  Last BM Date: 07/31/15  Intake/Output from previous day: 08/23 0701 - 08/24 0700 In: 300 [P.O.:290; I.V.:10] Out: 2325 [Urine:2325]  PHYSICAL EXAM General appearance: alert and no distress Resp: diminished breath sounds bilaterally and rhonchi bilaterally Cardio: S1, S2 normal GI: soft, non-tender; bowel sounds normal; no masses,  no organomegaly Extremities: edema 2++  Lab Results:  Results for orders placed or performed during the hospital encounter of 08/01/15 (from the past 48 hour(s))  Basic metabolic panel     Status: Abnormal   Collection Time: 08/01/15  1:00 AM  Result Value Ref Range   Sodium 140 135 - 145 mmol/L   Potassium 3.8 3.5 - 5.1 mmol/L   Chloride 104 101 - 111 mmol/L   CO2 29 22 - 32 mmol/L   Glucose, Bld 163 (H) 65 - 99 mg/dL   BUN 14 6 - 20 mg/dL   Creatinine, Ser 1.44 (H) 0.44 - 1.00 mg/dL   Calcium 8.5 (L) 8.9 - 10.3 mg/dL   GFR calc non Af Amer 34 (L) >60 mL/min   GFR calc Af Amer 39 (L) >60 mL/min    Comment: (NOTE) The eGFR has been calculated using the CKD EPI equation. This calculation has not been validated in all clinical situations. eGFR's persistently <60 mL/min signify possible Chronic Kidney Disease.    Anion gap 7 5 - 15  CBC with Differential/Platelet     Status: Abnormal   Collection Time: 08/01/15  1:00 AM  Result Value Ref  Range   WBC 8.1 4.0 - 10.5 K/uL   RBC 3.92 3.87 - 5.11 MIL/uL   Hemoglobin 11.3 (L) 12.0 - 15.0 g/dL   HCT 36.6 36.0 - 46.0 %   MCV 93.4 78.0 - 100.0 fL   MCH 28.8 26.0 - 34.0 pg   MCHC 30.9 30.0 - 36.0 g/dL   RDW 17.1 (H) 11.5 - 15.5 %   Platelets 194 150 - 400 K/uL   Neutrophils Relative % 64 43 - 77 %   Neutro Abs 5.2 1.7 - 7.7 K/uL   Lymphocytes Relative 22 12 - 46 %   Lymphs Abs 1.8 0.7 - 4.0 K/uL   Monocytes Relative 9 3 - 12 %   Monocytes Absolute 0.7 0.1 - 1.0 K/uL   Eosinophils Relative 4 0 - 5 %   Eosinophils Absolute 0.3 0.0 - 0.7 K/uL   Basophils Relative 1 0 - 1 %   Basophils Absolute 0.0 0.0 - 0.1 K/uL  Brain natriuretic peptide     Status: Abnormal   Collection Time: 08/01/15  1:00 AM  Result Value Ref Range   B Natriuretic Peptide 630.0 (H) 0.0 - 100.0 pg/mL  Troponin I     Status: Abnormal   Collection Time: 08/01/15  1:00 AM  Result Value Ref Range   Troponin I 0.04 (H) <0.031 ng/mL    Comment:  PERSISTENTLY INCREASED TROPONIN VALUES IN THE RANGE OF 0.04-0.49 ng/mL CAN BE SEEN IN:       -UNSTABLE ANGINA       -CONGESTIVE HEART FAILURE       -MYOCARDITIS       -CHEST TRAUMA       -ARRYHTHMIAS       -LATE PRESENTING MYOCARDIAL INFARCTION       -COPD   CLINICAL FOLLOW-UP RECOMMENDED.   TSH     Status: None   Collection Time: 08/01/15  1:00 AM  Result Value Ref Range   TSH 1.571 0.350 - 4.500 uIU/mL  Troponin I     Status: Abnormal   Collection Time: 08/01/15  5:00 AM  Result Value Ref Range   Troponin I 0.04 (H) <0.031 ng/mL    Comment:        PERSISTENTLY INCREASED TROPONIN VALUES IN THE RANGE OF 0.04-0.49 ng/mL CAN BE SEEN IN:       -UNSTABLE ANGINA       -CONGESTIVE HEART FAILURE       -MYOCARDITIS       -CHEST TRAUMA       -ARRYHTHMIAS       -LATE PRESENTING MYOCARDIAL INFARCTION       -COPD   CLINICAL FOLLOW-UP RECOMMENDED.   Glucose, capillary     Status: Abnormal   Collection Time: 08/01/15  5:11 AM  Result Value Ref Range    Glucose-Capillary 168 (H) 65 - 99 mg/dL   Comment 1 Notify RN    Comment 2 Document in Chart   Glucose, capillary     Status: Abnormal   Collection Time: 08/01/15  7:27 AM  Result Value Ref Range   Glucose-Capillary 169 (H) 65 - 99 mg/dL   Comment 1 Notify RN     ABGS No results for input(s): PHART, PO2ART, TCO2, HCO3 in the last 72 hours.  Invalid input(s): PCO2 CULTURES No results found for this or any previous visit (from the past 240 hour(s)). Studies/Results: Dg Chest 2 View  08/01/2015   CLINICAL DATA:  Increasing shortness of breath tonight. History of left lung cancer.  EXAM: CHEST  2 VIEW  COMPARISON:  07/08/2015  FINDINGS: Cardiomediastinal contours are unchanged. There is pulmonary edema. Volume loss in the left hemithorax consistent with postsurgical change. Mild bibasilar atelectasis, no confluent airspace disease. Underlying hyperinflation emphysema again seen. No pleural effusion or pneumothorax.  IMPRESSION: Development of pulmonary edema. Postsurgical change in the left hemithorax again seen.   Electronically Signed   By: Jeb Levering M.D.   On: 08/01/2015 01:45    Medications: I have reviewed the patient's current medications.  Assesment:   Principal Problem:   CHF exacerbation Active Problems:   CKD (chronic kidney disease) stage 3, GFR 30-59 ml/min   Stress-induced cardiomyopathy   Essential hypertension   Dyslipidemia   History of lung cancer   CHF (congestive heart failure)    Plan:  Medications reviewed Will continue iv diuresis Will monitor BMP Cardiology consult.    LOS: 0 days   Tariq Pernell 08/01/2015, 8:17 AM

## 2015-08-01 NOTE — ED Provider Notes (Signed)
CSN: [id number]     Arrival date & time 08/01/15  0030 History  This chart was scribed for Ripley Fraise, MD by Helane Gunther, ED Scribe. This patient was seen in room APA04/APA04 and the patient's care was started at 12:43 AM.    Chief Complaint  Patient presents with  . Shortness of Breath   Patient is a 81 y.o. female presenting with shortness of breath. The history is provided by the patient. No language interpreter was used.  Shortness of Breath Severity:  Moderate Onset quality:  Gradual Duration:  2 days Timing:  Constant Progression:  Worsening Chronicity:  Recurrent Relieved by:  Nothing Associated symptoms: cough and wheezing   Associated symptoms: no abdominal pain, no chest pain, no fever and no vomiting   Cough:    Cough characteristics:  Non-productive   Severity:  Mild   Chronicity:  New Risk factors: tobacco use (former smoker)    HPI Comments: Angela Lawson is a 81 y.o. female former smoker with a PMHx of COPD and CHF who presents to the Emergency Department complaining of SOB onset 2 days ago. She reports associated non-productive cough, wheezing, and dizziness while walking. She is usually on oxygen at home. She was on 3L, but was recently switched to 2.5L. She notes the SOB was already present before this switch took place. Pt denies fever, vomiting, CP, back pain, abdominal pain She reports about 6lb wt gain over past week   Past Medical History  Diagnosis Date  . Cancer     left lung/ 2009/ surg only  . Essential hypertension   . Arthritis   . Diverticulitis   . Type II diabetes mellitus   . H/O ventral hernia   . COPD (chronic obstructive pulmonary disease)   . Osteoporosis   . Adenocarcinoma of lung 08/20/2012  . Non-obstructive CAD     a. 04/2015 NSTEMI/Cath: LAD 10p, LCX 45m RCA 355m20d, EF 35-40 w/ apical ballooning.  . Takotsubo cardiomyopathy     a. 04/2015 Echo: EF 45-50%, mid-dist anterior/apical/inferoapical HK w/ hyperdynamic base. Gr 1  DD, mild AI, mild-mod MR, triv TR, PASP 4832m;  b. 04/2015 LV gram: Ef 35-40% w/ apical ballooning.  . DMarland Kitchenslipidemia   . Ventricular bigeminy     a. 04/2015 in setting of NSTEMI/Takotsubo.   Past Surgical History  Procedure Laterality Date  . Cholecystectomy    . Ectopic pregnancy surgery    . Abdominal hysterectomy    . Lung cancer surgery    . Incisional hernia repair N/A 08/26/2013    Procedure: HERNIA REPAIR INCISIONAL WITH MESH;  Surgeon: MarJamesetta SoD;  Location: [facility];  Service: General;  Laterality: N/A;  . Colonoscopy N/A 09/18/2014    Procedure: COLONOSCOPY;  Surgeon: SanDanie BinderD;  Location: [facility];  Service: Endoscopy;  Laterality: N/A;  8:30 AM - moved to 10:30 - CRosendo Gros notify pt  . Cardiac catheterization N/A 04/17/2015    Procedure: Left Heart Cath and Coronary Angiography;  Surgeon: ThoTroy SineD;  Location: [facility];  Service: Cardiovascular;  Laterality: N/A;   Family History  Problem Relation Age of Onset  . Diabetes Mother   . Hypertension Mother   . Diabetes Father   . Asthma    . Cancer    . Colon cancer Neg Hx    Social History  Substance Use Topics  . 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 Alcohol Use:  No   OB History    No data available     Review of Systems  Constitutional: Negative for fever.  Respiratory: Positive for cough, shortness of breath and wheezing.   Cardiovascular: Positive for leg swelling. Negative for chest pain.  Gastrointestinal: Negative for vomiting and abdominal pain.  Musculoskeletal: Negative for back pain.  Neurological: Positive for dizziness.  All other systems reviewed and are negative.   Allergies  Lisinopril  Home Medications   Prior to Admission medications   Medication Sig Start Date End Date Taking? Authorizing Provider  albuterol (PROAIR HFA) 108 (90 BASE) MCG/ACT inhaler Inhale 2 puffs into the lungs every 4 (four) hours as needed for wheezing  or shortness of breath.    Historical Provider, MD  alendronate (FOSAMAX) 70 MG tablet Take 70 mg by mouth once a week. On Fridays 03/14/15   Historical Provider, MD  ALPRAZolam Duanne Moron) 0.25 MG tablet Take 0.25 mg by mouth 2 (two) times daily. 04/30/15   Historical Provider, MD  aspirin EC 81 MG tablet Take 81 mg by mouth daily.    Historical Provider, MD  buPROPion (WELLBUTRIN SR) 150 MG 12 hr tablet Take 150 mg by mouth 2 (two) times daily.    Historical Provider, MD  Calcium Carbonate-Vitamin D (CALCIUM 600 + D PO) Take 1 tablet by mouth daily.    Historical Provider, MD  Cholecalciferol (VITAMIN D-3) 1000 UNITS CAPS Take 1 capsule by mouth daily.    Historical Provider, MD  doxycycline (VIBRAMYCIN) 50 MG capsule Take 1 capsule (50 mg total) by mouth 2 (two) times daily. 07/11/15   Rosita Fire, MD  fish oil-omega-3 fatty acids 1000 MG capsule Take 1 g by mouth 2 (two) times daily.     Historical Provider, MD  gabapentin (NEURONTIN) 300 MG capsule Take 300 mg by mouth daily.     Historical Provider, MD  Garlic 756 MG TABS Take 300 mg by mouth daily.    Historical Provider, MD  ipratropium-albuterol (DUONEB) 0.5-2.5 (3) MG/3ML SOLN Inhale 3 mLs into the lungs every 4 (four) hours as needed (wheezing/shortness of breath).  09/05/14   Historical Provider, MD  linagliptin (TRADJENTA) 5 MG TABS tablet Take 5 mg by mouth daily.    Historical Provider, MD  losartan (COZAAR) 50 MG tablet Take 50 mg by mouth daily. 06/29/15   Historical Provider, MD  meclizine (ANTIVERT) 25 MG tablet Take 1 tablet by mouth 3 (three) times daily as needed for dizziness.  09/05/14   Historical Provider, MD  metoprolol succinate (TOPROL-XL) 25 MG 24 hr tablet Take 1 tablet (25 mg total) by mouth daily. 06/26/15   Jettie Booze, MD  nitroGLYCERIN (NITROSTAT) 0.4 MG SL tablet Place 1 tablet (0.4 mg total) under the tongue every 5 (five) minutes as needed for chest pain. 04/20/15   Eileen Stanford, PA-C  omeprazole (PRILOSEC)  20 MG capsule Take 20 mg by mouth daily.    Historical Provider, MD  predniSONE (DELTASONE) 10 MG tablet 40 mg po daily for 3 days, then 30 mg po daily for 3 days, then 20 mg po daily for 3 days then 1 tab daily for 3 days then stop. 07/11/15   Rosita Fire, MD  pyridoxine (B-6) 100 MG tablet Take 100 mg by mouth daily.    Historical Provider, MD  rOPINIRole (REQUIP) 0.25 MG tablet Take 0.25 mg by mouth 3 (three) times daily.  09/05/14   Historical Provider, MD   BP 118/58 mmHg  Pulse 93  Temp(Src) 98.2 F (36.8 C)  Resp 24  Ht '5\' 4"'$  (1.626 m)  Wt 181 lb (82.101 kg)  BMI 31.05 kg/m2  SpO2 95% Physical Exam CONSTITUTIONAL: Well developed/well nourished HEAD: Normocephalic/atraumatic EYES: EOMI/PERRL ENMT: Mucous membranes moist NECK: supple no meningeal signs SPINE/BACK:entire spine nontender CV: S1/S2 noted, no murmurs/rubs/gallops noted LUNGS: scattered wheeze bilaterally, mild tachypnea noted.  ABDOMEN: soft, nontender, no rebound or guarding, bowel sounds noted throughout abdomen GU:no cva tenderness NEURO: Pt is awake/alert/appropriate, moves all extremitiesx4.  No facial droop. No past-pointing noted. No arm drift noted. EXTREMITIES: pulses normal/equal, full ROM, minimal pitting edema noted. SKIN: warm, color normal PSYCH: no abnormalities of mood noted, alert and oriented to situation  ED Course  Procedures  DIAGNOSTIC STUDIES: Oxygen Saturation is 97% on 3 L/min, adequate by my interpretation.    COORDINATION OF CARE: 12:51 AM - Discussed plans to order breathing treatments, as well as diagnostic studies and imaging. Pt advised of plan for treatment and pt agrees. 2:30 AM Pt found to have mild CHF She feels improved after nebs and has been given lasix Per records, pt with non-stemi earlier this year, had nonobstructive CAD but noted to have TAK. Cardiomyopathy, however since then her EF has recovered.  She is not on lasix Suspect symptoms today from mild CHF Given  stability and her appearance, will diurese in the ED and reassess.  Pt would like to go home and if she can ambulate at her baseline she may be safe for d/c home  I spoke to dr Domingo Cocking with cardiology We reviewed history/labs If she is discharged he recommends the following: Lasix '20mg'$  daily PRN for any wt gain >3 lbs If she takes any lasix more than 2 days she must have lab recheck F/u with cardiology in one week No other medication adjustments at this time 3:05 AM Pt noted to have increase in her troponin level I am concerned that this could potentially worsen or even lead to significant cardiomyopathy that occurred earlier in the year Would advise admission to monitor troponins and potential echocardiogram Advised admission at this time Pt agreeable D/w dr Marin Comment for admission Labs Review Labs Reviewed  BASIC METABOLIC PANEL - Abnormal; Notable for the following:    Glucose, Bld 163 (*)    Creatinine, Ser 1.44 (*)    Calcium 8.5 (*)    GFR calc non Af Amer 34 (*)    GFR calc Af Amer 39 (*)    All other components within normal limits  CBC WITH DIFFERENTIAL/PLATELET - Abnormal; Notable for the following:    Hemoglobin 11.3 (*)    RDW 17.1 (*)    All other components within normal limits  BRAIN NATRIURETIC PEPTIDE - Abnormal; Notable for the following:    B Natriuretic Peptide 630.0 (*)    All other components within normal limits  TROPONIN I - Abnormal; Notable for the following:    Troponin I 0.04 (*)    All other components within normal limits    Imaging Review Dg Chest 2 View  08/01/2015   CLINICAL DATA:  Increasing shortness of breath tonight. History of left lung cancer.  EXAM: CHEST  2 VIEW  COMPARISON:  07/08/2015  FINDINGS: Cardiomediastinal contours are unchanged. There is pulmonary edema. Volume loss in the left hemithorax consistent with postsurgical change. Mild bibasilar atelectasis, no confluent airspace disease. Underlying hyperinflation emphysema again seen. No  pleural effusion or pneumothorax.  IMPRESSION: Development of pulmonary edema. Postsurgical change in the left hemithorax again seen.  Electronically Signed   By: Jeb Levering M.D.   On: 08/01/2015 01:45   I have personally reviewed and evaluated these images and lab results as part of my medical decision-making.   EKG Interpretation   Date/Time:  Wednesday August 01 2015 00:52:44 EDT Ventricular Rate:  96 PR Interval:  128 QRS Duration: 88 QT Interval:  386 QTC Calculation: 488 R Axis:   18 Text Interpretation:  Sinus rhythm Ventricular bigeminy Probable left  atrial enlargement Nonspecific T abnrm, anterolateral leads Abnormal ekg  Confirmed by Christy Gentles  MD, Elenore Rota ([id number]) on 08/01/2015 1:03:19 AM     Medications  ipratropium-albuterol (DUONEB) 0.5-2.5 (3) MG/3ML nebulizer solution 3 mL (3 mLs Nebulization Given 08/01/15 0107)  predniSONE (DELTASONE) tablet 60 mg (60 mg Oral Given 08/01/15 0057)  furosemide (LASIX) injection 40 mg (40 mg Intravenous Given 08/01/15 0202)  aspirin chewable tablet 324 mg (324 mg Oral Given 08/01/15 0301)    MDM   Final diagnoses:  Acute congestive heart failure, unspecified congestive heart failure type    Nursing notes including past medical history and social history reviewed and considered in documentation xrays/imaging reviewed by myself and considered during evaluation Labs/vital reviewed myself and considered during evaluation   I personally performed the services described in this documentation, which was scribed in my presence. The recorded information has been reviewed and is accurate.      Ripley Fraise, MD 08/01/15 [phone]

## 2015-08-01 NOTE — ED Notes (Signed)
Pt given cup of sprite, per Dr. Marin Comment

## 2015-08-02 DIAGNOSIS — I509 Heart failure, unspecified: Secondary | ICD-10-CM

## 2015-08-02 LAB — BASIC METABOLIC PANEL
Anion gap: 7 (ref 5–15)
BUN: 23 mg/dL — ABNORMAL HIGH (ref 6–20)
CO2: 32 mmol/L (ref 22–32)
Calcium: 9 mg/dL (ref 8.9–10.3)
Chloride: 100 mmol/L — ABNORMAL LOW (ref 101–111)
Creatinine, Ser: 1.38 mg/dL — ABNORMAL HIGH (ref 0.44–1.00)
GFR calc Af Amer: 41 mL/min — ABNORMAL LOW (ref >60)
GFR calc non Af Amer: 35 mL/min — ABNORMAL LOW (ref >60)
Glucose, Bld: 133 mg/dL — ABNORMAL HIGH (ref 65–99)
Potassium: 3.7 mmol/L (ref 3.5–5.1)
Sodium: 139 mmol/L (ref 135–145)

## 2015-08-02 LAB — GLUCOSE, CAPILLARY
Glucose-Capillary: 144 mg/dL — ABNORMAL HIGH (ref 65–99)
Glucose-Capillary: 164 mg/dL — ABNORMAL HIGH (ref 65–99)

## 2015-08-02 MED ORDER — FUROSEMIDE 40 MG PO TABS: 40.0000 mg | ORAL_TABLET | Freq: Every day | ORAL | Status: DC

## 2015-08-02 MED ORDER — METOPROLOL SUCCINATE ER 25 MG PO TB24: 37.5000 mg | ORAL_TABLET | Freq: Every day | ORAL | Status: DC

## 2015-08-02 NOTE — Progress Notes (Signed)
Subjective: Patient feels better. She is diuresing well. She was seen by cardiology and Echo was repeated which showed no change from previous.   Objective: Vital signs in last 24 hours: Temp:  [97.6 F (36.4 C)-98.4 F (36.9 C)] 98.4 F (36.9 C) (08/25 0500) Pulse Rate:  [55-83] 80 (08/25 0500) Resp:  [18] 18 (08/24 2231) BP: (131-147)/(38-68) 147/68 mmHg (08/25 0500) SpO2:  [93 %-100 %] 95 % (08/25 0500) Weight change:  Last BM Date: 08/01/15  Intake/Output from previous day: 08/24 0701 - 08/25 0700 In: 723 [P.O.:720; I.V.:3] Out: 2300 [Urine:2300]  PHYSICAL EXAM General appearance: alert and no distress Resp: diminished breath sounds bilaterally and rhonchi bilaterally Cardio: S1, S2 normal GI: soft, non-tender; bowel sounds normal; no masses,  no organomegaly Extremities: edema 2++  Lab Results:  Results for orders placed or performed during the hospital encounter of 08/01/15 (from the past 48 hour(s))  Basic metabolic panel     Status: Abnormal   Collection Time: 08/01/15  1:00 AM  Result Value Ref Range   Sodium 140 135 - 145 mmol/L   Potassium 3.8 3.5 - 5.1 mmol/L   Chloride 104 101 - 111 mmol/L   CO2 29 22 - 32 mmol/L   Glucose, Bld 163 (H) 65 - 99 mg/dL   BUN 14 6 - 20 mg/dL   Creatinine, Ser 1.44 (H) 0.44 - 1.00 mg/dL   Calcium 8.5 (L) 8.9 - 10.3 mg/dL   GFR calc non Af Amer 34 (L) >60 mL/min   GFR calc Af Amer 39 (L) >60 mL/min    Comment: (NOTE) The eGFR has been calculated using the CKD EPI equation. This calculation has not been validated in all clinical situations. eGFR's persistently <60 mL/min signify possible Chronic Kidney Disease.    Anion gap 7 5 - 15  CBC with Differential/Platelet     Status: Abnormal   Collection Time: 08/01/15  1:00 AM  Result Value Ref Range   WBC 8.1 4.0 - 10.5 K/uL   RBC 3.92 3.87 - 5.11 MIL/uL   Hemoglobin 11.3 (L) 12.0 - 15.0 g/dL   HCT 36.6 36.0 - 46.0 %   MCV 93.4 78.0 - 100.0 fL   MCH 28.8 26.0 - 34.0 pg   MCHC 30.9 30.0 - 36.0 g/dL   RDW 17.1 (H) 11.5 - 15.5 %   Platelets 194 150 - 400 K/uL   Neutrophils Relative % 64 43 - 77 %   Neutro Abs 5.2 1.7 - 7.7 K/uL   Lymphocytes Relative 22 12 - 46 %   Lymphs Abs 1.8 0.7 - 4.0 K/uL   Monocytes Relative 9 3 - 12 %   Monocytes Absolute 0.7 0.1 - 1.0 K/uL   Eosinophils Relative 4 0 - 5 %   Eosinophils Absolute 0.3 0.0 - 0.7 K/uL   Basophils Relative 1 0 - 1 %   Basophils Absolute 0.0 0.0 - 0.1 K/uL  Brain natriuretic peptide     Status: Abnormal   Collection Time: 08/01/15  1:00 AM  Result Value Ref Range   B Natriuretic Peptide 630.0 (H) 0.0 - 100.0 pg/mL  Troponin I     Status: Abnormal   Collection Time: 08/01/15  1:00 AM  Result Value Ref Range   Troponin I 0.04 (H) <0.031 ng/mL    Comment:        PERSISTENTLY INCREASED TROPONIN VALUES IN THE RANGE OF 0.04-0.49 ng/mL CAN BE SEEN IN:       -UNSTABLE ANGINA       -  CONGESTIVE HEART FAILURE       -MYOCARDITIS       -CHEST TRAUMA       -ARRYHTHMIAS       -LATE PRESENTING MYOCARDIAL INFARCTION       -COPD   CLINICAL FOLLOW-UP RECOMMENDED.   TSH     Status: None   Collection Time: 08/01/15  1:00 AM  Result Value Ref Range   TSH 1.571 0.350 - 4.500 uIU/mL  Troponin I     Status: Abnormal   Collection Time: 08/01/15  5:00 AM  Result Value Ref Range   Troponin I 0.04 (H) <0.031 ng/mL    Comment:        PERSISTENTLY INCREASED TROPONIN VALUES IN THE RANGE OF 0.04-0.49 ng/mL CAN BE SEEN IN:       -UNSTABLE ANGINA       -CONGESTIVE HEART FAILURE       -MYOCARDITIS       -CHEST TRAUMA       -ARRYHTHMIAS       -LATE PRESENTING MYOCARDIAL INFARCTION       -COPD   CLINICAL FOLLOW-UP RECOMMENDED.   Glucose, capillary     Status: Abnormal   Collection Time: 08/01/15  5:11 AM  Result Value Ref Range   Glucose-Capillary 168 (H) 65 - 99 mg/dL   Comment 1 Notify RN    Comment 2 Document in Chart   Glucose, capillary     Status: Abnormal   Collection Time: 08/01/15  7:27 AM  Result  Value Ref Range   Glucose-Capillary 169 (H) 65 - 99 mg/dL   Comment 1 Notify RN   Troponin I     Status: None   Collection Time: 08/01/15 10:44 AM  Result Value Ref Range   Troponin I 0.03 <0.031 ng/mL    Comment:        NO INDICATION OF MYOCARDIAL INJURY.   Glucose, capillary     Status: Abnormal   Collection Time: 08/01/15 11:28 AM  Result Value Ref Range   Glucose-Capillary 168 (H) 65 - 99 mg/dL  Glucose, capillary     Status: Abnormal   Collection Time: 08/01/15  4:26 PM  Result Value Ref Range   Glucose-Capillary 166 (H) 65 - 99 mg/dL  Troponin I     Status: None   Collection Time: 08/01/15  4:41 PM  Result Value Ref Range   Troponin I <0.03 <0.031 ng/mL    Comment:        NO INDICATION OF MYOCARDIAL INJURY.   Glucose, capillary     Status: Abnormal   Collection Time: 08/01/15  8:45 PM  Result Value Ref Range   Glucose-Capillary 185 (H) 65 - 99 mg/dL   Comment 1 Notify RN    Comment 2 Document in Chart   Basic metabolic panel     Status: Abnormal   Collection Time: 08/02/15  5:38 AM  Result Value Ref Range   Sodium 139 135 - 145 mmol/L   Potassium 3.7 3.5 - 5.1 mmol/L   Chloride 100 (L) 101 - 111 mmol/L   CO2 32 22 - 32 mmol/L   Glucose, Bld 133 (H) 65 - 99 mg/dL   BUN 23 (H) 6 - 20 mg/dL   Creatinine, Ser 1.38 (H) 0.44 - 1.00 mg/dL   Calcium 9.0 8.9 - 10.3 mg/dL   GFR calc non Af Amer 35 (L) >60 mL/min   GFR calc Af Amer 41 (L) >60 mL/min    Comment: (NOTE) The eGFR has been  calculated using the CKD EPI equation. This calculation has not been validated in all clinical situations. eGFR's persistently <60 mL/min signify possible Chronic Kidney Disease.    Anion gap 7 5 - 15  Glucose, capillary     Status: Abnormal   Collection Time: 08/02/15  7:38 AM  Result Value Ref Range   Glucose-Capillary 144 (H) 65 - 99 mg/dL    ABGS No results for input(s): PHART, PO2ART, TCO2, HCO3 in the last 72 hours.  Invalid input(s): PCO2 CULTURES No results found for  this or any previous visit (from the past 240 hour(s)). Studies/Results: Dg Chest 2 View  08/01/2015   CLINICAL DATA:  Increasing shortness of breath tonight. History of left lung cancer.  EXAM: CHEST  2 VIEW  COMPARISON:  07/08/2015  FINDINGS: Cardiomediastinal contours are unchanged. There is pulmonary edema. Volume loss in the left hemithorax consistent with postsurgical change. Mild bibasilar atelectasis, no confluent airspace disease. Underlying hyperinflation emphysema again seen. No pleural effusion or pneumothorax.  IMPRESSION: Development of pulmonary edema. Postsurgical change in the left hemithorax again seen.   Electronically Signed   By: Jeb Levering M.D.   On: 08/01/2015 01:45    Medications: I have reviewed the patient's current medications.  Assesment:   Principal Problem:   CHF exacerbation Active Problems:   CKD (chronic kidney disease) stage 3, GFR 30-59 ml/min   Stress-induced cardiomyopathy   Essential hypertension   Dyslipidemia   History of lung cancer   CHF (congestive heart failure)    Plan:  Medications reviewed Will continue iv diuresis for now Will monitor [facility] Cardiology consult. appreciated    LOS: 1 day   Rilda Bulls 08/02/2015, 8:13 AM

## 2015-08-02 NOTE — Progress Notes (Signed)
Patient with orders to be discharged home. Discharge instructions given, patient verbalized understanding. Patient stable.

## 2015-08-02 NOTE — Progress Notes (Signed)
Primary cardiologist: Dr. Casandra Doffing  Seen for followup: Diastolic heart failure  Subjective:    Feels better today. No chest pain or palpitations. Breathing is nonlabored.  Objective:   Temp:  [97.6 F (36.4 C)-98.4 F (36.9 C)] 98.4 F (36.9 C) (08/25 0500) Pulse Rate:  [55-83] 80 (08/25 0500) Resp:  [18] 18 (08/24 2231) BP: (131-147)/(38-68) 147/68 mmHg (08/25 0500) SpO2:  [93 %-100 %] 95 % (08/25 0500) Last BM Date: 08/01/15  Filed Weights   08/01/15 0042  Weight: 181 lb (82.101 kg)    Intake/Output Summary (Last 24 hours) at 08/02/15 0842 Last data filed at 08/02/15 0430  Gross per 24 hour  Intake    723 ml  Output   1500 ml  Net   -777 ml    Telemetry: Sinus rhythm.  Exam:  General: Overweight woman, no distress.  Lungs: Clear, nonlabored.  Cardiac: RRR with ectopy consistent with bigeminy, no gallop.  Abdomen: NABS.  Extremities: Trace ankle edema.  Lab Results:  Basic Metabolic Panel:  Recent Labs Lab 08/01/15 0100 08/02/15 0538  NA 140 139  K 3.8 3.7  CL 104 100*  CO2 29 32  GLUCOSE 163* 133*  BUN 14 23*  CREATININE 1.44* 1.38*  CALCIUM 8.5* 9.0    CBC:  Recent Labs Lab 08/01/15 0100  WBC 8.1  HGB 11.3*  HCT 36.6  MCV 93.4  PLT 194    Cardiac Enzymes:  Recent Labs Lab 08/01/15 0500 08/01/15 1044 08/01/15 1641  TROPONINI 0.04* 0.03 <0.03    Echocardiogram 08/01/15: Study Conclusions  - Left ventricle: The cavity size was normal. Wall thickness was increased in a pattern of mild LVH. Systolic function was normal. The estimated ejection fraction was in the range of 55% to 60%. Wall motion was normal; there were no regional wall motion abnormalities. Doppler parameters are consistent with abnormal left ventricular relaxation (grade 1 diastolic dysfunction). - Aortic valve: Mildly calcified annulus. Trileaflet. - Mitral valve: Calcified annulus. There was trivial regurgitation. - Right ventricle: The  cavity size was mildly dilated. The moderator band was prominent. Systolic function was mildly to moderately reduced. - Right atrium: Central venous pressure (est): 3 mm Hg. - Atrial septum: There was increased thickness of the septum, consistent with lipomatous hypertrophy. No defect or patent foramen ovale was identified. - Tricuspid valve: There was trivial regurgitation. - Pulmonary arteries: PA peak pressure: 25 mm Hg (S). - Pericardium, extracardiac: There was no pericardial effusion.  Impressions:  - Mild LVH with LVEF 55-60% and grade 1 diastolic dysfunction. MAC with trivial mitral regurgitation. Mildly dilated RV with mildly to moderately decreased contraction. Trivial tricuspid regurgitation with PASP 25 mmHg.  Medications:   Scheduled Medications: . ALPRAZolam  0.25 mg Oral BID  . antiseptic oral rinse  7 mL Mouth Rinse BID  . aspirin EC  81 mg Oral Daily  . buPROPion  150 mg Oral BID  . furosemide  40 mg Intravenous Daily  . gabapentin  300 mg Oral Daily  . heparin  5,000 Units Subcutaneous 3 times per day  . insulin aspart  0-15 Units Subcutaneous TID WC  . insulin aspart  0-5 Units Subcutaneous QHS  . ipratropium-albuterol  3 mL Inhalation QID  . linagliptin  5 mg Oral Daily  . losartan  50 mg Oral Daily  . metoprolol succinate  25 mg Oral Daily  . omega-3 acid ethyl esters  1 g Oral BID  . pantoprazole  40 mg Oral Daily  .  predniSONE  20 mg Oral Q breakfast  . pyridoxine  100 mg Oral Daily  . rOPINIRole  0.25 mg Oral TID  . sodium chloride  3 mL Intravenous Q12H     PRN Medications:  acetaminophen **OR** acetaminophen, albuterol, ondansetron **OR** ondansetron (ZOFRAN) IV   Assessment:   1. Acute on chronic diastolic heart failure, LVEF stable at 55-60% by follow-up echocardiogram with grade 1 diastolic dysfunction. She feels better with IV diuresis, approximately 3500 mL out more than in.  2. History of Takotsubo cardiomyopathy in May  2016, LVEF remains stable.  3. Nonobstructive CAD by cardiac catheterization in May 2016. No angina symptoms. Cardiac markers argue against ACS.  4. CKD, stage 3, creatinine stable 1.3-1.4.  5. PVCs and ventricular bigeminy, no sustained arrhythmias by telemetry. She is on low-dose Toprol-XL.   Plan/Discussion:    Patient is stable for discharge from a cardiac perspective. Would send her home on Lasix 40 mg daily, Toprol-XL 37.5 mg daily, and continue her remaining cardiac medications. She would like to follow up closer to home and we will arrange a visit in the [place] office in the next few weeks.   Satira Sark, M.D., F.A.C.C.

## 2015-08-03 DIAGNOSIS — I509 Heart failure, unspecified: Secondary | ICD-10-CM | POA: Diagnosis not present

## 2015-08-03 DIAGNOSIS — J449 Chronic obstructive pulmonary disease, unspecified: Secondary | ICD-10-CM | POA: Diagnosis not present

## 2015-08-03 DIAGNOSIS — I10 Essential (primary) hypertension: Secondary | ICD-10-CM | POA: Diagnosis not present

## 2015-08-03 DIAGNOSIS — C7800 Secondary malignant neoplasm of unspecified lung: Secondary | ICD-10-CM | POA: Diagnosis not present

## 2015-08-07 ENCOUNTER — Other Ambulatory Visit: Payer: Self-pay

## 2015-08-07 NOTE — Patient Outreach (Signed)
[facility]) Care Management  08/07/2015  RAELYNNE LUDWICK 1934-03-10 [id number]   EMMI STROKE PROGRAM:   Red on Emmi Heart Failure Dashboard Weight 174 Know how/when to take meds? NO  Outreach call #1 (Week 1).  Patient not reached.   RN CM left HIPAA compliant voice message with name and #.  RN CM will reschedule next outreach within 1 day.  [name], RN, BSN, [facility], CCM  [employer] Management Coordinator [phone] Direct [phone] Cell [phone] Office [phone] Fax

## 2015-08-07 NOTE — Patient Outreach (Signed)
[place] [facility]) Care Management  08/07/2015  Angela Lawson 06/24/1934 [id number]   Patient triggered RED on EMMI Heart Failure Dashboard, assigned [name], RN to outreach.  Thanks, [name]. [place], [place] Assistant Phone: [phone] Fax: [phone]

## 2015-08-08 ENCOUNTER — Other Ambulatory Visit: Payer: Self-pay

## 2015-08-08 NOTE — Patient Outreach (Signed)
[place] [facility]) Care Management  08/08/2015  Angela Lawson [birth year] [id number]   EMMI CHF PROGRAM:  Red on Emmi Heart Failure Dashboard Weight 174 Know how/when to take meds? NO  Inbound call received back from patient.  PCP:  Dr. Rosita Fire last appt 07/09/15 - next appt 08/2015 Cardiologist:  Last appt 07/09/15  - no follow up appt unless problems. HH:  None  Screening & Initial Assessment: 08/08/15   Social:  H/O multiple ED visits over the past year per Epic review.   Patient lives in her home with her daughter and granddaughter.  Daughter works and patient is alone intermittently during the day.  H/O daughter interested in aide to check in on patient but patient does not have Medicaid.  Inpatient CM provided daughter with private duty list as resource.  Falls - none.  Ambulates with assistive device:  Walker/W/C.  Hearing:  Hearing test - 2 months ago and bilateral hearing aides recommended.  Patient did not get due to Insurance would only pay $400 dollars and the cost of both was $2,000.   Caregiver:  Daughter Transportation:  Daughter DME:  Oxygen - ([facility]),  W/C & can be used as a walker, Acu-Chek Glucometer 07/2015 (provided by [facility]).   CHF:  Weight:  173 on 08/08/15.  Patient weighs daily and has scales at home.  BP: patient unable to state BP reading.  States checked by MD appointment only and has no BP cuff in the home.   DM Patient has only starting checking her blood sugar daily post hospital discharge.  States MD advised to check daily.  Patient states MD has previously told patient it was not necessary to check Blood sugar and patient did not have a home glucometer.  Hospital provided patient with Acu-Chek Glucometer at discharge. 07/2015.  States she takes diabetes medicine: 1 pill a day  Morning blood sugar: 123  Medications: States issue with medications has been resolved.  States daughter manages all medications and  prepared a pill box for patient.  Patient is not able to complete medication reconciliation as daughter manages.  Daughter is at work.   States taking medications as ordered, not missing any dosages, getting refills and no issues with affordability.  Patient uses Marketing executive.   Consent: Patient gives verbal consent to continue with EMMI CHF program.  RN CM advised patient of next follow-up call within one week.   Plan:   RN CM will follow up again in one week (EMMI CHF Week 2) RN CM will follow-up on hearing aides benefit for patient.  RN CM notified [facility] Administrative Assistant:  Case opended  [name], RN, BSN, [facility], CCM  [employer] [phone] Direct [phone] Cell [phone] Office [phone] Fax

## 2015-08-08 NOTE — Patient Outreach (Signed)
[facility]) Care Management  08/08/2015  SISSI PADIA 08/22/1934 [id number]   EMMI STROKE PROGRAM:  Red on Emmi Heart Failure Dashboard Weight 174 Know how/when to take meds? NO  Outreach call #2 (Week 1). Patient not reached.  RN CM left HIPAA compliant voice message with name and #.  RN CM will reschedule next outreach within 1 day.  [name], RN, BSN, [facility], CCM  [employer] Management Coordinator [phone] Direct [phone] Cell [phone] Office [phone] Fax

## 2015-08-09 ENCOUNTER — Other Ambulatory Visit: Payer: Self-pay

## 2015-08-09 NOTE — Patient Outreach (Signed)
[facility]) Care Management  08/09/2015  Angela Lawson 08/01/1934 [id number]  EMMI STROKE HEART FAILURE NOTE Red Alert:  Day 5 - HF Patient water Pills - HF water Pills and fluids.   Outreach call to patient to follow-up on Emmi Red Alert.  RN CM spoke with patient for initial call yesterday 08/08/2015 (See note for further details).  Patient confirmed she is not having any issues and medication issues have been resolved.  RN CM reviewed medication management:  Requested patient have daughter leave a medication list so patient may learn about her medications.  Weight this am 173.8.  (up .8 lbs)  Patient provided update on Cardiologist name: Dr. Mina Marble - no further appt's unless issues arise.  RN CM reviewed sodium / fluid restriction.  RN CM advised in importance to keep weight the same or decreasing.  Advised to monitor salt closely today:  Such as no pickles, tomato juice, saltines.  RN CM reviewed weight management and when to report change in weight to MD.  RN CM advised in importance of reporting all changes and weight changes to primary MD since not being following by cardiologist on routine appt's.    Plan: RN CM advised in next follow-up appt within one week (Week 2)  [name], RN, BSN, [facility], [place] Management Care Management Coordinator [phone] Direct [phone] Cell [phone] Office [phone] Fax   [name], RN, BSN, [facility], [place] Management Care Management Coordinator [phone] Direct [phone] Cell [phone] Office [phone] Fax

## 2015-08-09 NOTE — Patient Outreach (Signed)
[place] [facility]) Care Management  08/09/2015  Angela Lawson Oct 29, 1934 [id number]   Patient triggered RED on Heart Failure Dashboard, notification sent to [name], RN.  Thanks, [name]. [place], [place] Assistant Phone: [phone] Fax: [phone]

## 2015-08-10 ENCOUNTER — Other Ambulatory Visit: Payer: Self-pay

## 2015-08-10 NOTE — Patient Outreach (Signed)
[facility]) Care Management  08/10/2015  [name] 10/20/1934 [id number]   Triggered RED on EMMI Heart Failure Dashboard, notification sent to [name], RN (for [name], RN).  Thanks, [name]. [place], [place] Assistant Phone: [phone] Fax: [phone]

## 2015-08-10 NOTE — Patient Outreach (Signed)
[place] [facility]) Care Management  08/10/2015  Angela Lawson June 25, 1934 [id number]  SUBJECTIVE:  Telephone call to patient regarding EMMI stroke referral.  HIPAA confirmed with patient.  Patient states, "I am doing all right."  Patient states her weight is down to 172lbs,   Patient states she used her inhaler two times this today. Patient denies any increased shortness of breath, cough, or chest pain.  Patient states she is continuing to wear hear oxygen at 2 L. Patient states she continues to take her medications as prescribed by her doctor.  Patient states," what else can I eat for breakfast besides eggs.  Patient state, "sometimes I just don't know what else to eat for breakfast that is not high is salt."  RNCM advised patient to report any symptom changes to her doctor.   RNCM discussed with patient fluid intake restrictions and low sodium diet.   RNCM discussed with patient other breakfast options: oatmeal, fruit, yogurt, grits, whole wheat bread.   ASSESSMENT:  EMMI stroke transition program.  PLAN: RNCM will follow up with patient within 1 week.  [name] RN,BSN,CCM [place] Coordinator [phone]

## 2015-08-11 DIAGNOSIS — C3490 Malignant neoplasm of unspecified part of unspecified bronchus or lung: Secondary | ICD-10-CM | POA: Diagnosis not present

## 2015-08-14 ENCOUNTER — Other Ambulatory Visit: Payer: Self-pay

## 2015-08-14 NOTE — Patient Outreach (Addendum)
[facility]) Care Management  08/14/2015  Angela Lawson [birth year] [id number]     EMMI STROKE HEART FAILURE NOTE (Week 2) Red Alert: Day 10 - HF Patient Smoking   Outreach call to patient to follow-up on Emmi Red Alert.   PCP:  Dr. Truett Mainland - next appt 08/28/15 Cardiologist name: Dr. Mina Marble - last appt 07/09/15 and no further appt's unless issues arise Patient confirmed she is not having any issues. Weight 171.2 this morning. (Down from 173.8 on last contact call).  RN CM praised patient for weight management and daily weights.  RN CM reiterated salt/sodium restrictions.  RN CM reviewed HF Green, Yellow, Red Zones.   Medications:  Patient states she has not made a medication list but will ask her daughter to make one.   Patient states she is taking her medications as ordered and having no medication concerns or questions.  RN CM requested patient to have daughter leave a medication list so patient may learn about her medications and review medications on RN CM telephonic contact calls.   Smoking:  Patient confirms she is a Non-Smoker.  Smoked for 20 years and quit in 2007.  Patient states she has no exposure to 2nd hand smoke.  RN CM provided education on smoking and 2nd hand smoke exposure risk.  RN CM encouraged to protect self from 2nd hand smoke.   RN CM discussed risk of stroke and heart attack.   Care Coordination Services:  Hearing aid benefit Insurance:  UHC/AARP Medicare Complete.  Hearing Exam to diagnose and treat hearing and balance issues: $10 copay Services Routine hearing exam (for up to 1 every year): $10 copay Hearing aid: $390-450 copay for each hearing aid, depending on the Type up to 2 per year. Patient states she has no issues hearing phone calls and does not need to adjust phone volume.  RN CM provided the above benefit information if needed.    Plan: RN CM advised in next follow-up appt within one week (Week 3)  [name], RN, BSN, [facility], CCM  [employer] Management Coordinator [phone] Direct [phone] Cell [phone] Office [phone] Fax

## 2015-08-14 NOTE — Patient Outreach (Signed)
[facility]) Care Management  08/14/2015  VIOLANDA BOBECK May 26, 1934 [id number]   Triggered RED on EMMI Heart Failure dashboard.  Notification sent to [name], RN.  Thanks, [name]. [place], [place] Assistant Phone: [phone] Fax: [phone]

## 2015-08-15 ENCOUNTER — Ambulatory Visit: Payer: Self-pay

## 2015-08-20 DIAGNOSIS — E119 Type 2 diabetes mellitus without complications: Secondary | ICD-10-CM | POA: Diagnosis not present

## 2015-08-21 ENCOUNTER — Other Ambulatory Visit: Payer: Self-pay

## 2015-08-21 NOTE — Patient Outreach (Signed)
[place] [facility]) Care Management  08/21/2015  Angela Lawson [birth year] [id number]  EMMI STROKE HEART FAILURE NOTE   Outreach call #1 for Week 3 Emmi Stroke Heart Failure follow-up.    Patient not reached.   RN CM will follow-up again within 1-3 days.    [name], RN, BSN, [facility], CCM  [employer] Management Coordinator [phone] Direct [phone] Cell [phone] Office [phone] Fax

## 2015-08-23 ENCOUNTER — Other Ambulatory Visit: Payer: Self-pay

## 2015-08-23 NOTE — Patient Outreach (Addendum)
[facility]) Care Management  08/23/2015  Angela Lawson December 24, 1933 [id number]  EMMI HEART FAILURE NOTE (Week 3)   PCP: Dr. Truett Mainland - next appt 08/28/15 Cardiologist name: Dr. Mina Marble - last appt 07/09/15 and no further appt's unless issues arise Oncologist:  Next appt 09/2015 Eye appt completed - 08/20/15   CHF Patient confirmed she is not having any issues.  Weight  Up this morning to 174 from 171.2 one week ago.  (2.8+) Non-smoker DME:  Oxygen ([facility])  Medications:  H/o RN CM requested patient to have daughter leave a medication list so patient may learn about her medications and review medications on RN CM telephonic contact calls.  H/o patient has not made a medication list but previously reported she would ask her daughter to make one.  States her daughter does not have time.   Patient states she is taking her medications as ordered and having no medication concerns or questions.   Care Coordination Services:  H/o Hearing aid benefit information provided on 08/14/15 contact call.    Plan: CHF RN CM praised patient for weight management and daily weights.  RN CM reiterated salt/sodium restrictions.  RN CM reviewed HF Green, Yellow, Red Zones.  Patient had difficulty with teach back but verbalized understanding with review.  RN CM will send Emmi Ed:  Heart Failure: How to be [employer]  RN CM advised in next follow-up appt within one week (Week 4) and plan to close case if patient remains stable.  RN CM praised patient for good self management to avoid unnecessary admission.   [name], RN, BSN, [facility], CCM  [employer] Management Coordinator [phone] Direct [phone] Cell [phone] Office [phone] Fax

## 2015-08-30 ENCOUNTER — Other Ambulatory Visit: Payer: Self-pay

## 2015-08-30 NOTE — Patient Outreach (Signed)
[facility]) Care Management  08/30/2015  LOURETTA TANTILLO 11/29/1934 [id number]   Triggered RED on Heart Failure dashboard, notification sent to [name], RN.  Thanks, [name]. [place], [place] Assistant Phone: [phone] Fax: [phone]

## 2015-08-30 NOTE — Patient Outreach (Signed)
[place] [facility]) Care Management  08/30/2015  Angela Lawson [birth year] [id number]  Care Coordination Note  Outreach call to Primary MD, Dr. Legrand Rams.  #1)  PCP: Dr. Truett Mainland - patient reported on previous contact call next appt 08/28/15 - patient states appt is not until next week but unable to give date of appt.  #2)  Flu Vaccine 2016:  No MD office contact confirms next appt scheduled for 09/25/15 at 2:00.  States patient cancelled her appt for 08/24/15 and was not ever scheduled for 08/28/15.   RN CM requested to add note to appt that patient needs her Flu vaccine on next appt.    Plan:  RN CM will follow-up with patient on appt verification and stress importance of keeping MD appts.  RN CM will assess for compliance barriers due to missed appt.   [name], RN, BSN, [facility], CCM  [employer] Management Coordinator [phone] Direct [phone] Cell [phone] Office [phone] Fax

## 2015-08-30 NOTE — Patient Outreach (Signed)
[facility]) Care Management  08/30/2015  Angela Lawson [birth year] [id number]  Care Coordination Note  Outreach call to patient to provide update: Outreach call to Primary MD, Dr. Legrand Rams 08/30/15.  #1) PCP: Dr. Truett Mainland - patient reported on previous contact call next appt 08/28/15 - patient states appt is not until next week but unable to give date of appt.  MD office contact confirms next appt scheduled for 09/25/15 at 2:00. States patient cancelled her appt for 08/24/15 and was not ever scheduled for 08/28/15.  RN CM provided appt verification and stressed importance of keeping all MD appts.  RN CM assessed for compliance barriers due to missed appt.  Patient states she cancelled the appt because she had just seen the doctor during her inpatient hospital admission.  RN CM provided education on importance of seeing MD within 7 days of ED visit or admission.  Stressed that Medicare wants all patients to see their MDs for transitional care appt's which is after being in the hospital or ED.  RN CM stressed to always keep these appt's as Medicare wants patient to be seen to make sure patient is improving and monitor medications which will help prevent patient from having another admission.    #2) Flu Vaccine 2016: No RN CM requested to add note to appt that patient needs her Flu vaccine on next appt.  Patient stated "Ok".    Plan:  RN CM will follow-up within one week to review educational materials and possible case closure if stable.   [name], RN, BSN, [facility], CCM  [employer] Management Coordinator [phone] Direct [phone] Cell [phone] Office [phone] Fax

## 2015-08-30 NOTE — Patient Outreach (Signed)
[facility]) Care Management  08/30/2015  Angela Lawson 02-09-1934 [id number]   EMMI HEART FAILURE NOTE (Week 4) Red Alert 08/30/15  Weighed self today? NO   PCP: Dr. Truett Mainland - patient reported on previous contact call next appt 08/28/15 - patient states appt is not until next week but unable to give date of appt.  Cardiologist name: Dr. Mina Marble - last appt 07/09/15 and no further appt's unless issues arise Oncologist: Next appt 09/2015 Eye appt completed - 08/20/15   Social:  Patient is widowed and  lives in her home.  Caregiver:  Patient has two daughters.  DME: Oxygen ([facility]), scales, BP cuff.   CHF Patient confirmed she is not having any issues. Patient states she did weigh today and weighs daily.  Patient confirms she is logging her weight in a tablet.     Weight:   175   H/o last ED weight 181 on 08/02/15 and 173 on 08/08/15. Patient states a normal weight for her is 179.  BP 124/60 this am.  Self checks BP reading every other day.  Non-smoker:  Quit in 2009 Flu Vaccine:  No  Medications:  H/o RN CM requested patient to have daughter leave a medication list so patient may learn about her medications and review medications on RN CM telephonic contact calls. H/o patient has not made a medication list but previously reported she would ask her daughter to make one. States her daughter does not have time.   Patient states she is taking her medications as ordered and having no medication concerns or questions. States MD has not changed or added any new medications.   Plan: CHF RN CM praised patient for good self management to avoid unnecessary admission.  RN CM praised patient for daily weights and BP checks.  RN CM advised to take BP/Weight log to all MD appt's.  RN CM reiterated salt/sodium restrictions.  RN CM reviewed HF Green, Yellow, Red Zones again this call. Patient had difficulty with teach back but verbalized understanding  with review.  RN CM will review Emmi Ed: Heart Failure: How to be [employer] sent 08/23/2015 on next contact call.   (patient has not received yet).  RN CM provided education on importance of flu vaccine and increased risk associated to having CHF and getting flu.  RN CM encouraged to get vaccine on next appt.  RN CM will notify PCP and request vaccine on next office appt.   RN CM advised in next follow-up appt within one week (Week 5) to review educational material and close case if patient remains stable.   Care Coordination Services:  H/o Hearing aid benefit information provided on 08/14/15 contact call.  RN CM will contact PCP for next appt verification.   RN CM will request flu vaccine on next PCP office appt.   [name], RN, BSN, [facility], CCM  [employer] Management Coordinator [phone] Direct [phone] Cell [phone] Office [phone] Fax

## 2015-09-06 ENCOUNTER — Other Ambulatory Visit: Payer: Self-pay

## 2015-09-06 DIAGNOSIS — J441 Chronic obstructive pulmonary disease with (acute) exacerbation: Secondary | ICD-10-CM

## 2015-09-06 NOTE — Addendum Note (Signed)
Addended by: Standley Brooking on: 09/06/2015 04:56 PM   Modules accepted: Orders

## 2015-09-06 NOTE — Patient Outreach (Addendum)
[facility]) Care Management  09/06/2015  Angela Lawson 1934-02-27 [id number]   EMMI HEART FAILURE NOTE (Week 5) Red Alert 08/30/15  Weighed self today? NO   PCP: Dr. Truett Lawson - next appt10/18/16 at 2:00. Cardiologist name: Dr. Mina Lawson - last appt 07/09/15 and no further appt's unless issues arise Oncologist: Next appt 09/2015 Eye appt completed - 08/20/15   Social: Patient is widowed and lives in her home. H/o Hearing aid benefit information provided on 08/14/15 contact call.  Caregiver: Patient has two daughters.  DME: Oxygen ([facility]), scales, BP cuff, scales.  CHF Patient confirmed she is not having any issues. Patient states she did weigh today and weighs daily. Patient confirms she is logging her weight in a tablet.  Weight: 176 H/o last ED weight 181 on 08/02/15 and 173 on 08/08/15. Patient states a normal weight for her is 179.  BP 130/60 this am. Self checks BP reading every other day.  Non-smoker: Quit in 2009 Flu Vaccine: No - Patient planning to get vaccine on 09/25/15 MD appt.   Medications:  H/o RN CM requested patient to have daughter leave a medication list so patient may learn about her medications and review medications on RN CM telephonic contact calls. H/o patient has not made a medication list but previously reported she would ask her daughter to make one. States her daughter does not have time.   Patient states she is taking her medications as ordered and having no medication concerns or questions. States MD has not changed or added any new medications.   Plan: CHF RN CM praised patient for good self management to avoid unnecessary admission.  RN CM praised patient for daily weights and BP checks.  RN CM advised to take BP/Weight log to all MD appt's.  RN CM reiterated salt/sodium restrictions.  RN CM reviewed HF Green, Yellow, Red Zones again this call. Patient had difficulty with teach  back but verbalized understanding with review.  RN CM will review Emmi Ed: Heart Failure: How to be [employer] Smart sent 08/23/2015 / reviewed 09/06/15.  RN CM provided education on importance of flu vaccine and increased risk associated to having CHF and getting flu. RN CM encouraged to get vaccine on next appt. RN CM advised patient in referral to [place] for COPD education to avoid other comorbidity complications which can cause complications with HF management.  RN CM sent [place] Referral.   RN CM closed case to [facility] Heart Failure Program 09/06/2015.  [name], RN, BSN, [facility], CCM  [employer] Management Coordinator [phone] Direct [phone] Cell [phone] Office [phone] Fax

## 2015-09-07 NOTE — Patient Outreach (Signed)
[facility]) Care Management  09/07/2015  Angela Lawson [birth year] [id number]   Request from [name], RN to assign [name], assigned [name], RN.  Thanks, [name]. [place], [place] Assistant Phone: [phone] Fax: [phone]

## 2015-09-10 DIAGNOSIS — C3490 Malignant neoplasm of unspecified part of unspecified bronchus or lung: Secondary | ICD-10-CM | POA: Diagnosis not present

## 2015-09-12 ENCOUNTER — Other Ambulatory Visit: Payer: Self-pay

## 2015-09-12 DIAGNOSIS — J441 Chronic obstructive pulmonary disease with (acute) exacerbation: Secondary | ICD-10-CM

## 2015-09-12 NOTE — Patient Outreach (Signed)
[facility]) Care Management  [place]  09/12/2015   Angela Lawson 29-May-1934 [id number]  Subjective:  Initial call to patient for health coaching for COPD.  Patient reports she is doing ok but gets winded with activity. Patient daughter lives with her and has varied work shifts but is available to help.  Patient reports primary doctor visit scheduled for 09-25-15.  Patient weighs daily for heart failure and watches her salt intake. Checks sugars once a week for diabetes.  Patient reports she needs some hearing aids but cannot afford. Patient agreeable for social work referral for hearing aid resources.     COPD: Patient wears oxygen most of the time.  Discussed falls safety with oxygen tubing.  Patient uses nebulizer and inhaler about 3-4 times per day and is not on a control inhaler. Patient not familiar with COPD action plan.  Discussed green zone with patient and use of pursed lip breathing.  She verbalizes understanding.  Objective:   Current Medications:  Current Outpatient Prescriptions  Medication Sig Dispense Refill  . albuterol (PROAIR HFA) 108 (90 BASE) MCG/ACT inhaler Inhale 2 puffs into the lungs every 4 (four) hours as needed for wheezing or shortness of breath.    Marland Kitchen alendronate (FOSAMAX) 70 MG tablet Take 70 mg by mouth once a week. On Fridays    . ALPRAZolam (XANAX) 0.25 MG tablet Take 0.25 mg by mouth 2 (two) times daily.  2  . aspirin EC 81 MG tablet Take 81 mg by mouth daily.    Marland Kitchen atorvastatin (LIPITOR) 80 MG tablet Take 1 tablet by mouth every morning.    Marland Kitchen buPROPion (WELLBUTRIN SR) 150 MG 12 hr tablet Take 150 mg by mouth 2 (two) times daily.    . Cholecalciferol (VITAMIN D-3) 1000 UNITS CAPS Take 1 capsule by mouth daily.    . fish oil-omega-3 fatty acids 1000 MG capsule Take 1 g by mouth 2 (two) times daily.     . furosemide (LASIX) 40 MG tablet Take 1 tablet (40 mg total) by mouth daily. 30 tablet 3  . gabapentin (NEURONTIN) 300 MG capsule  Take 300 mg by mouth daily.     . Garlic 169 MG TABS Take 300 mg by mouth daily.    Marland Kitchen ipratropium-albuterol (DUONEB) 0.5-2.5 (3) MG/3ML SOLN Inhale 3 mLs into the lungs every 4 (four) hours as needed (wheezing/shortness of breath).     . linagliptin (TRADJENTA) 5 MG TABS tablet Take 5 mg by mouth daily.    Marland Kitchen losartan (COZAAR) 50 MG tablet Take 50 mg by mouth daily.  2  . meclizine (ANTIVERT) 25 MG tablet Take 1 tablet by mouth 3 (three) times daily as needed for dizziness.     . metoprolol succinate (TOPROL-XL) 25 MG 24 hr tablet Take 1 tablet (25 mg total) by mouth daily. 90 tablet 3  . omeprazole (PRILOSEC) 20 MG capsule Take 20 mg by mouth daily.    Marland Kitchen pyridoxine (B-6) 100 MG tablet Take 100 mg by mouth daily.    Marland Kitchen rOPINIRole (REQUIP) 0.25 MG tablet Take 0.25 mg by mouth 3 (three) times daily.      No current facility-administered medications for this visit.    Functional Status:  In your present state of health, do you have any difficulty performing the following activities: 09/12/2015 08/30/2015  Hearing? N (No Data)  Vision? N -  Difficulty concentrating or making decisions? N -  Walking or climbing stairs? N -  Dressing or bathing? N -  Doing errands, shopping? N -  Preparing Food and eating ? N -  Using the Toilet? N -  In the past six months, have you accidently leaked urine? N -  Do you have problems with loss of bowel control? N -  Managing your Medications? N -  Managing your Finances? N -  Housekeeping or managing your Housekeeping? Y -    Fall/Depression Screening: PHQ 2/9 Scores 09/12/2015 08/30/2015 08/08/2015  PHQ - 2 Score 0 0 0    Assessment: Patient will benefit from health coach outreach for disease management of COPD.  Plan:  [facility] CM Care Plan Problem One        Most Recent Value   Care Plan Problem One  Knowledge deficit COPD   Role Documenting the Problem One  Health Coach   Care Plan for Problem One  Active   THN Long Term Goal (31-90 days)  Patient will  be able to explain COPD zones on action plan within the next 90 days    THN Long Term Goal Start Date  09/12/15   Interventions for Problem One [place] to send COPD information to patient that includes action plan.    THN CM Short Term Goal #1 (0-30 days)  Patient will be able to describe COPD green zone within 30 days.     THN CM Short Term Goal #1 Start Date  09/12/15   Interventions for Short Term Goal #1  Discussed with patient green zone on action plan.  Action plan sent to patient    THN CM Short Term Goal #2 (0-30 days)  Patient will be able to describe techniques of controlling breath during episodes of shortness breath within 30 days.    THN CM Short Term Goal #2 Start Date  09/12/15   Interventions for Short Term Goal #2  Discussed with patient pursed lipped breath. Discussed use of rescue inhaler.  Health Coach encouraged patient to use frequent rest periods to conserve energy    [facility] CM Care Plan Problem Two        Most Recent Value   Care Plan Problem Two  Medication Reconciliation    Role Documenting the Problem Two  Care Management Telephonic Coordinator   Care Plan for Problem Two  Not Active   THN CM Short Term Goal #1 (0-30 days)  Patient's daughter will provide patient with a list of medications so patient can discuss with RNCM over the next 30 days.  [Goal continued. ]   THN CM Short Term Goal #1 Start Date  08/08/15   [facility] CM Short Term Goal #1 Met Date   09/12/15   Interventions for Short Term Goal #2   RN CM will provide patient education on medication self management tools over the next 30 days.    THN CM Short Term Goal #2 (0-30 days)  Patient will provide teach back to RN CM using Green, yellow, red zones within HF Emmi Program over the next week.    THN CM Short Term Goal #2 Start Date  08/14/15   [facility] CM Short Term Goal #2 Met Date  08/23/15   Interventions for Short Term Goal #2  RN CM will provide education on green, yellow, red HF zones to  patient.     THN CM Care Plan Problem Three        Most Recent Value   Care Plan Problem Three  Knowledge deficit related to low salt diet.    Role  Documenting the Problem Three  Care Management Telephonic Coordinator   Care Plan for Problem Three  Not Active   THN CM Short Term Goal #1 (0-30 days)  Patient will improve self knowledge of salt restricitons over the next week.   THN CM Short Term Goal #1 Start Date  08/16/15   [facility] CM Short Term Goal #1 Met Date  09/12/15   Interventions for Short Term Goal #1  RN CM will mail Emmi Ed:  Heart Failure: How to be [employer] and review with patient on next call.  [Insurance Benefits Search and patient updated.  ]       RN Health Coach will do social work referral for hearing aid resources [place] will provide ongoing education for patient on COPD through phone calls and sending printed information to patient for further discussion.  RN Health Coach will send welcome packet with consent to patient as well as printed information on COPD.  RN Health Coach will send initial barriers letter, assessment, and care plan to primary care physician.  RN Health Coach will contact patient within one month and patient agrees to next contact.

## 2015-09-12 NOTE — Patient Outreach (Signed)
[facility]) Care Management  09/12/2015  Angela Lawson [birth year] [id number]   Request from [name], RN to assign SW, assigned [name], LCSW.  Thanks, [name]. [place], Lilly Assistant Phone: [phone] Fax: [phone]

## 2015-09-12 NOTE — Addendum Note (Signed)
Addended by: [name] on: 09/12/2015 12:48 PM   Modules accepted: Orders

## 2015-09-13 ENCOUNTER — Encounter: Payer: Self-pay | Admitting: Licensed Clinical Social Worker

## 2015-09-13 ENCOUNTER — Other Ambulatory Visit: Payer: Self-pay | Admitting: Licensed Clinical Social Worker

## 2015-09-13 ENCOUNTER — Other Ambulatory Visit: Payer: Medicare Other

## 2015-09-13 NOTE — Patient Outreach (Signed)
Assessment:  CSW received referral on Anner M. Ibe.  CSW completed chart review on client. CSW received client referral from [name], [place]. [place] reported that client needed information about resources for hearing aids for client.  Of note, RN Crystal Hutchinson had previously spoken with client on 08/14/15 about [facility] and cost for copay for client  for audiological visit.  Crystal also discussed with client customary costs for hearing aid (cost range) potentially for client  for hearing aids through use of [facility] coverage of client.  CSW called home phone number of client on 09/13/15.  CSW spoke via phone with client on 09/13/15. CSW verified identity of client.  Client gave verbal permission to CSW for CSW to talk with client about health needs of client. Client and CSW spoke of food resources for client. CSW informed client about [facility] food pantry in [place], [place] and encouraged client to contact that food pantry to learn more about resources for food support.  CSW also spoke with client about hearing needs of client. CSW informed client that client could call  [facility] in [place], [place] at [phone] to discuss hearing needs of client and learn more about hearing resources for client through that agency.  CSW also encouraged client to call member benefits number on her insurance card to talk with representative about insurance benefits for client related to hearing needs of client.  CSW gave client the [place] number of [phone] and encouraged her to call CSW as needed to address social work needs of client. CSW and client completed needed [facility] assessments for client.  CSW encouraged client to continue working telephonically with [place], [name], RN.  [place] thanked client for phone call with CSW on 09/13/15.  Plan: Client to take medications as prescribed and  attend scheduled medical appointments. Client to continue to communicate, as needed, with [place], [name], RN. CSW to call client in two weeks to assess needs of client at that time.  Norva Riffle.Donnell Wion MSW, LCSW Licensed Clinical Social Worker [facility] Care Management [phone]

## 2015-09-15 NOTE — Discharge Summary (Signed)
Physician Discharge Summary  Patient ID: Angela Lawson MRN: [id number] DOB/AGE: [birth year] 81 y.o. Primary Care Physician:Camellia Popescu, MD Admit date: 08/01/2015 Discharge date:08/02/2015   Acute on chronic CHF Principal Problem:   CHF exacerbation ([place]) Active Problems:   CKD (chronic kidney disease) stage 3, GFR 30-59 ml/min   Stress-induced cardiomyopathy   Essential hypertension   Dyslipidemia   History of lung cancer   CHF (congestive heart failure) ([place])     Medication List    STOP taking these medications        CALCIUM 600 + D PO     doxycycline 50 MG capsule  Commonly known as:  VIBRAMYCIN      TAKE these medications        alendronate 70 MG tablet  Commonly known as:  FOSAMAX  Take 70 mg by mouth once a week. On Fridays     ALPRAZolam 0.25 MG tablet  Commonly known as:  XANAX  Take 0.25 mg by mouth 2 (two) times daily.     aspirin EC 81 MG tablet  Take 81 mg by mouth daily.     atorvastatin 80 MG tablet  Commonly known as:  LIPITOR  Take 1 tablet by mouth every morning.     buPROPion 150 MG 12 hr tablet  Commonly known as:  WELLBUTRIN SR  Take 150 mg by mouth 2 (two) times daily.     fish oil-omega-3 fatty acids 1000 MG capsule  Take 1 g by mouth 2 (two) times daily.     furosemide 40 MG tablet  Commonly known as:  LASIX  Take 1 tablet (40 mg total) by mouth daily.     gabapentin 300 MG capsule  Commonly known as:  NEURONTIN  Take 300 mg by mouth daily.     Garlic 782 MG Tabs  Take 300 mg by mouth daily.     ipratropium-albuterol 0.5-2.5 (3) MG/3ML Soln  Commonly known as:  DUONEB  Inhale 3 mLs into the lungs every 4 (four) hours as needed (wheezing/shortness of breath).     linagliptin 5 MG Tabs tablet  Commonly known as:  TRADJENTA  Take 5 mg by mouth daily.     losartan 50 MG tablet  Commonly known as:  COZAAR  Take 50 mg by mouth daily.     meclizine 25 MG tablet  Commonly known as:  ANTIVERT  Take 1 tablet by mouth 3 (three)  times daily as needed for dizziness.     metoprolol succinate 25 MG 24 hr tablet  Commonly known as:  TOPROL-XL  Take 1 tablet (25 mg total) by mouth daily.     omeprazole 20 MG capsule  Commonly known as:  PRILOSEC  Take 20 mg by mouth daily.     PROAIR HFA 108 (90 BASE) MCG/ACT inhaler  Generic drug:  albuterol  Inhale 2 puffs into the lungs every 4 (four) hours as needed for wheezing or shortness of breath.     pyridoxine 100 MG tablet  Commonly known as:  B-6  Take 100 mg by mouth daily.     rOPINIRole 0.25 MG tablet  Commonly known as:  REQUIP  Take 0.25 mg by mouth 3 (three) times daily.     Vitamin D-3 1000 UNITS Caps  Take 1 capsule by mouth daily.        Discharged Condition: improved    Consults: Cardiology  Significant Diagnostic Studies: No results found.  Lab Results: Basic Metabolic Panel: No results for input(s): NA, K,  CL, CO2, GLUCOSE, BUN, CREATININE, CALCIUM, MG, PHOS in the last 72 hours. Liver Function Tests: No results for input(s): AST, ALT, ALKPHOS, BILITOT, PROT, ALBUMIN in the last 72 hours.   CBC: No results for input(s): WBC, NEUTROABS, HGB, HCT, MCV, PLT in the last 72 hours.  No results found for this or any previous visit (from the past 240 hour(s)).   Hospital Course:   This a 81 years old female with history of multiple medical illnesses who was admitted due to shortness of breath. Patient had acute on chronic CHF. She was diuresed well and improved. Patient was seen by cardiology and her medications was adjusted and patient was discharged in stable condition.   Discharge Exam: Blood pressure 113/54, pulse 80, temperature 98.5 F (36.9 C), temperature source Oral, resp. rate 20, height '5\' 4"'$  (1.626 m), weight 82.101 kg (181 lb), SpO2 93 %.    Disposition:  home        Follow-up Information    Follow up with [facility], MD In 2 weeks.   Specialty:  Internal Medicine   Contact information:   [address] [place] [address] [phone]       Signed: Rosita Fire   09/15/2015, 11:42 AM

## 2015-09-19 ENCOUNTER — Other Ambulatory Visit (HOSPITAL_COMMUNITY): Payer: Self-pay

## 2015-09-19 DIAGNOSIS — C3490 Malignant neoplasm of unspecified part of unspecified bronchus or lung: Secondary | ICD-10-CM

## 2015-09-24 ENCOUNTER — Ambulatory Visit (HOSPITAL_COMMUNITY): Admission: RE | Admit: 2015-09-24 | Payer: Medicare Other | Source: Ambulatory Visit

## 2015-09-24 ENCOUNTER — Other Ambulatory Visit (HOSPITAL_COMMUNITY): Payer: Medicare Other

## 2015-09-25 DIAGNOSIS — I10 Essential (primary) hypertension: Secondary | ICD-10-CM | POA: Diagnosis not present

## 2015-09-25 DIAGNOSIS — I509 Heart failure, unspecified: Secondary | ICD-10-CM | POA: Diagnosis not present

## 2015-09-25 DIAGNOSIS — Z23 Encounter for immunization: Secondary | ICD-10-CM | POA: Diagnosis not present

## 2015-09-25 DIAGNOSIS — E119 Type 2 diabetes mellitus without complications: Secondary | ICD-10-CM | POA: Diagnosis not present

## 2015-09-25 DIAGNOSIS — J449 Chronic obstructive pulmonary disease, unspecified: Secondary | ICD-10-CM | POA: Diagnosis not present

## 2015-09-26 NOTE — Assessment & Plan Note (Addendum)
No show

## 2015-09-26 NOTE — Progress Notes (Signed)
No show

## 2015-09-27 ENCOUNTER — Ambulatory Visit (HOSPITAL_COMMUNITY): Payer: Medicare Other | Admitting: Oncology

## 2015-10-04 ENCOUNTER — Encounter (HOSPITAL_COMMUNITY): Payer: Self-pay

## 2015-10-09 ENCOUNTER — Other Ambulatory Visit: Payer: Self-pay

## 2015-10-09 NOTE — Patient Outreach (Signed)
[facility]) Care Management  [place]  10/09/2015   Angela Lawson 03-02-1934 [id number]  Subjective: Telephone call to patient for monthly outreach.  Patient reports she is doing good.  Reports seeing PCP recently.  Patient reports no changes in medications.  Patient states she will see PCP again in about 3 months.    COPD: Patient reports that she has not had any big problems with her COPD.  Still using inhaler about 3-4 times a day and oxygen intermittently. Patient states she received COPD zone chart.  Discussed zone chart purpose and discussed green zone. She verbalized understanding.    Objective:   Current Medications:  Current Outpatient Prescriptions  Medication Sig Dispense Refill  . albuterol (PROAIR HFA) 108 (90 BASE) MCG/ACT inhaler Inhale 2 puffs into the lungs every 4 (four) hours as needed for wheezing or shortness of breath.    Marland Kitchen alendronate (FOSAMAX) 70 MG tablet Take 70 mg by mouth once a week. On Fridays    . ALPRAZolam (XANAX) 0.25 MG tablet Take 0.25 mg by mouth 2 (two) times daily.  2  . aspirin EC 81 MG tablet Take 81 mg by mouth daily.    Marland Kitchen atorvastatin (LIPITOR) 80 MG tablet Take 1 tablet by mouth every morning.    Marland Kitchen buPROPion (WELLBUTRIN SR) 150 MG 12 hr tablet Take 150 mg by mouth 2 (two) times daily.    . Cholecalciferol (VITAMIN D-3) 1000 UNITS CAPS Take 1 capsule by mouth daily.    . fish oil-omega-3 fatty acids 1000 MG capsule Take 1 g by mouth 2 (two) times daily.     . furosemide (LASIX) 40 MG tablet Take 1 tablet (40 mg total) by mouth daily. 30 tablet 3  . gabapentin (NEURONTIN) 300 MG capsule Take 300 mg by mouth daily.     . Garlic 382 MG TABS Take 300 mg by mouth daily.    Marland Kitchen ipratropium-albuterol (DUONEB) 0.5-2.5 (3) MG/3ML SOLN Inhale 3 mLs into the lungs every 4 (four) hours as needed (wheezing/shortness of breath).     . linagliptin (TRADJENTA) 5 MG TABS tablet Take 5 mg by mouth daily.    Marland Kitchen losartan (COZAAR) 50 MG  tablet Take 50 mg by mouth daily.  2  . meclizine (ANTIVERT) 25 MG tablet Take 1 tablet by mouth 3 (three) times daily as needed for dizziness.     . metoprolol succinate (TOPROL-XL) 25 MG 24 hr tablet Take 1 tablet (25 mg total) by mouth daily. 90 tablet 3  . omeprazole (PRILOSEC) 20 MG capsule Take 20 mg by mouth daily.    Marland Kitchen pyridoxine (B-6) 100 MG tablet Take 100 mg by mouth daily.    Marland Kitchen rOPINIRole (REQUIP) 0.25 MG tablet Take 0.25 mg by mouth 3 (three) times daily.      No current facility-administered medications for this visit.    Functional Status:  In your present state of health, do you have any difficulty performing the following activities: 09/13/2015 09/12/2015  Hearing? Y N  Vision? Y N  Difficulty concentrating or making decisions? N N  Walking or climbing stairs? Y N  Dressing or bathing? N N  Doing errands, shopping? N N  Preparing Food and eating ? N N  Using the Toilet? N N  In the past six months, have you accidently leaked urine? N N  Do you have problems with loss of bowel control? N N  Managing your Medications? N N  Managing your Finances? N N  Housekeeping or managing your Housekeeping? [name]    Fall/Depression Screening: PHQ 2/9 Scores 10/09/2015 09/13/2015 09/12/2015 08/30/2015 08/08/2015  PHQ - 2 Score 0 2 0 0 0  PHQ- 9 Score - 5 - - -    Assessment: Patient continues to benefit from health coach outreach for disease management.    Plan:  [facility] CM Care Plan Problem One        Most Recent Value   Care Plan Problem One  Knowledge deficit COPD   Role Documenting the Problem One  Health Coach   Care Plan for Problem One  Active   THN Long Term Goal (31-90 days)  Patient will be able to explain COPD zones on action plan within the next 90 days    THN Long Term Goal Start Date  09/12/15   Interventions for Problem One Long Term Goal  Discussed Action plan with patient.     THN CM Short Term Goal #1 (0-30 days)  Patient will be able to describe COPD green zone  within 30 days.     THN CM Short Term Goal #1 Start Date  10/09/15 [goal continued]   Interventions for Short Term Goal #1  Reemphasized green zone of heart failure chart with patient.     THN CM Short Term Goal #2 (0-30 days)  Patient will be able to describe techniques of controlling breath during episodes of shortness breath within 30 days.    THN CM Short Term Goal #2 Start Date  10/09/15 [goal continued]   Interventions for Short Term Goal #2  Reviewed pursed lip breathing technique with patient.      RN Health Coach will contact patient within one month and patient agrees to next outreach.  [name], RN, MSN [place] [phone]

## 2015-10-11 DIAGNOSIS — C3490 Malignant neoplasm of unspecified part of unspecified bronchus or lung: Secondary | ICD-10-CM | POA: Diagnosis not present

## 2015-10-23 ENCOUNTER — Ambulatory Visit (HOSPITAL_COMMUNITY): Payer: Medicare Other | Admitting: Oncology

## 2015-10-23 DIAGNOSIS — C3490 Malignant neoplasm of unspecified part of unspecified bronchus or lung: Secondary | ICD-10-CM | POA: Diagnosis not present

## 2015-10-23 DIAGNOSIS — I10 Essential (primary) hypertension: Secondary | ICD-10-CM | POA: Diagnosis not present

## 2015-10-23 DIAGNOSIS — J449 Chronic obstructive pulmonary disease, unspecified: Secondary | ICD-10-CM | POA: Diagnosis not present

## 2015-10-23 DIAGNOSIS — E119 Type 2 diabetes mellitus without complications: Secondary | ICD-10-CM | POA: Diagnosis not present

## 2015-10-26 DIAGNOSIS — J449 Chronic obstructive pulmonary disease, unspecified: Secondary | ICD-10-CM | POA: Diagnosis not present

## 2015-11-04 ENCOUNTER — Encounter (HOSPITAL_COMMUNITY): Payer: Self-pay | Admitting: Oncology

## 2015-11-04 DIAGNOSIS — M810 Age-related osteoporosis without current pathological fracture: Secondary | ICD-10-CM

## 2015-11-04 HISTORY — DX: Age-related osteoporosis without current pathological fracture: M81.0

## 2015-11-04 NOTE — Assessment & Plan Note (Signed)
Stage I A., non-small cell lung cancer, consistent with a poorly differentiated adenocarcinoma, status post resection 07/07/2008 in [place].  Labs today: CBC diff, CMET  She is due for annual CT imaging of chest.  Order is placed and will be performed this month or next month.  Return in 12 months for follow-up with repeat labs and CT imaging.

## 2015-11-04 NOTE — Progress Notes (Signed)
[facility], MD [address] [place] [address]  Osteoporosis  Adenocarcinoma of lung, unspecified laterality ([place]) - Plan: CBC with Differential, Comprehensive metabolic panel  CURRENT THERAPY: Surveillance per NCCN guidelines  INTERVAL HISTORY: Angela Lawson 81 y.o. female returns for  regular  visit for followup of stage I A., non-small cell lung cancer, consistent with a poorly differentiated adenocarcinoma, status post resection 07/07/2008 in [place].  Oncology History   stage I A., non-small cell lung cancer, consistent with a poorly differentiated adenocarcinoma, status post resection 07/07/2008 in [place].       Adenocarcinoma of lung ([place])   07/07/2008 Surgery Performed in [place], [place].  Consistent with poorly differentiated adenocarcinoma.   02/27/2014 Imaging CT of chest demonstrating a RLL solid nodular component measuring 9 mm at the site of previous ground-glass opacity. Slight increase in size of an anterior mediastinal node measuring 1.1 cm with stable 1 cm pretracheal lymph node. Consider PET   03/20/2014 Imaging PET scan- No hypermetabolic pulmonary lesions to suggest recurrent or metastatic pulmonary disease.  Weakly positive left internal mammary lymph node and left axillary lymph node (? inflammatory process involving the left breast).   Chart reviewed. She was a no-show for her 09/26/15 appointment with me.  I personally reviewed and went over laboratory results with the patient.  The results are noted within this dictation.  Labs will be updated today.  I personally reviewed and went over radiographic studies with the patient.  The results are noted within this dictation.  Mammogram was BIRADS 2 on 05/24/2015.   She is overdue for CT imaging of chest per NCCN guidelines.  We will get this set-up soon.  She notes B/L knee pain that improves with movement and as the day progresses.  She notes that Dr. Legrand Rams reports that it is  arthritic in nature.  She denies hemoptysis, weight loss, decreased appetite, cough, dyspnea.  Past Medical History  Diagnosis Date  . Essential hypertension   . Arthritis   . Diverticulitis   . Type II diabetes mellitus ([place])   . H/O ventral hernia   . COPD (chronic obstructive pulmonary disease) ([place])   . Osteoporosis   . Adenocarcinoma of lung ([place])     Left lung 2009, resected  . Non-obstructive CAD     a. 04/2015 NSTEMI/Cath: LAD 10p, LCX 63m RCA 327m20d, EF 35-40 w/ apical ballooning.  . Takotsubo cardiomyopathy     a. 04/2015 Echo: EF 45-50%, mid-dist anterior/apical/inferoapical HK w/ hyperdynamic base. Gr 1 DD, mild AI, mild-mod MR, triv TR, PASP 4862m;  b. 04/2015 LV gram: Ef 35-40% w/ apical ballooning.  . DMarland Kitchenslipidemia   . Ventricular bigeminy     a. 04/2015 in setting of NSTEMI/Takotsubo.  . CHF (congestive heart failure) ([place] . Osteoporosis 11/04/2015    Managed by Dr. FanLegrand Rams  [facility]; Non-traumatic tear of right rotator cuff; Pain in joint, shoulder region; Muscle weakness (generalized); Toe fracture; Herpes genitalis in women; Elevated troponin; COPD exacerbation (HCC); CKD (chronic kidney disease) stage 3, GFR 30-59 ml/min; Stress-induced cardiomyopathy; Essential hypertension; Type II diabetes mellitus (HCCConcepcionDyslipidemia; History of lung cancer; Takotsubo cardiomyopathy; Ventricular bigeminy; NSTEMI (non-ST elevated myocardial infarction) (HCCMennoCHF exacerbation (HCC); CHF (congestive heart failure) ([place]CC[place]daleand Osteoporosis on her problem list.     is allergic to lisinopril.  [name] not currently have medications on file.  Past Surgical History  Procedure Laterality  Date  . Cholecystectomy    . Ectopic pregnancy surgery    . Abdominal hysterectomy    . Lung cancer surgery    . Incisional hernia repair N/A 08/26/2013    Procedure: HERNIA REPAIR INCISIONAL WITH MESH;  Surgeon: Angela So, MD;   Location: [facility] ORS;  Service: General;  Laterality: N/A;  . Colonoscopy N/A 09/18/2014    Procedure: COLONOSCOPY;  Surgeon: Angela Binder, MD;  Location: [facility] ENDO SUITE;  Service: Endoscopy;  Laterality: N/A;  8:30 AM - moved to 10:30 Angela Lawson to notify pt  . Cardiac catheterization N/A 04/17/2015    Procedure: Left Heart Cath and Coronary Angiography;  Surgeon: Angela Sine, MD;  Location: [place] CV LAB;  Service: Cardiovascular;  Laterality: N/A;    Denies any headaches, dizziness, double vision, fevers, chills, night sweats, nausea, vomiting, diarrhea, constipation, chest pain, heart palpitations, blood in stool, black tarry stool, urinary pain, urinary burning, urinary frequency, hematuria.   PHYSICAL EXAMINATION  ECOG PERFORMANCE STATUS: 1 - Symptomatic but completely ambulatory  Filed Vitals:   11/05/15 1322  BP: 91/70  Pulse: 90  Temp: 97.9 F (36.6 C)  Resp: 22    GENERAL:alert, no distress, well nourished, well developed, comfortable, cooperative, obese and smiling SKIN: skin color, texture, turgor are normal. HEAD: Normocephalic, No masses, lesions, tenderness or abnormalities EYES: normal, PERRLA, EOMI, Conjunctiva are pink and non-injected EARS: External ears normal OROPHARYNX:lips, buccal mucosa, and tongue normal and mucous membranes are moist  NECK: supple, no adenopathy, thyroid normal size, non-tender, without nodularity, no stridor, non-tender, trachea midline LYMPH:  not examined BREAST:not examined LUNGS: decreased breath sounds HEART: regular rate & rhythm, no murmurs, no gallops, S1 normal and S2 normal ABDOMEN:abdomen soft, non-tender, obese, normal bowel sounds and no masses or organomegaly BACK: Back symmetric, no curvature EXTREMITIES:less then 2 second capillary refill, no joint deformities, effusion, or inflammation, no skin discoloration, no cyanosis  NEURO: alert & oriented x 3 with fluent speech, no focal motor/sensory deficits, gait  normal   LABORATORY DATA: CBC    Component Value Date/Time   WBC 8.4 11/05/2015 1415   RBC 4.23 11/05/2015 1415   HGB 12.3 11/05/2015 1415   HCT 39.7 11/05/2015 1415   PLT 165 11/05/2015 1415   MCV 93.9 11/05/2015 1415   MCH 29.1 11/05/2015 1415   MCHC 31.0 11/05/2015 1415   RDW 15.8* 11/05/2015 1415   LYMPHSABS 1.8 11/05/2015 1415   MONOABS 0.9 11/05/2015 1415   EOSABS 0.2 11/05/2015 1415   BASOSABS 0.0 11/05/2015 1415      Chemistry      Component Value Date/Time   NA 137 11/05/2015 1415   K 3.9 11/05/2015 1415   CL 100* 11/05/2015 1415   CO2 30 11/05/2015 1415   BUN 16 11/05/2015 1415   CREATININE 1.21* 11/05/2015 1415      Component Value Date/Time   CALCIUM 9.1 11/05/2015 1415   CALCIUM 10.0 08/20/2012 1408   ALKPHOS 49 11/05/2015 1415   AST 18 11/05/2015 1415   ALT 19 11/05/2015 1415   BILITOT 0.8 11/05/2015 1415        RADIOGRAPHIC STUDIES:  PATHOLOGY:   ASSESSMENT/PLAN:   Adenocarcinoma of lung (HCC) Stage I A., non-small cell lung cancer, consistent with a poorly differentiated adenocarcinoma, status post resection 07/07/2008 in [place].  Labs today: CBC diff, CMET  She is due for annual CT imaging of chest.  Order is placed and will be performed this month or next month.  Return in 12 months for follow-up with repeat labs and CT imaging.   THERAPY PLAN:  NCCN guidelines for Non-Small Cell Lung Cancer Surveillance are as follows:  A. H+P every 6-12 months and CT of chest every 6 months for the first two years  B. Low-dose spiral CT chest annually after first two years of surveillance CTs   C. PET scan not typically indicated for surveillance  D. Smoking cessation advice, counseling, and pharmacotherapy   All questions were answered. The patient knows to call the clinic with any problems, questions or concerns. We can certainly see the patient much sooner if necessary.  Patient and plan discussed with Dr. Ancil Linsey and she  is in agreement with the aforementioned.    Moraima Burd 11/05/2015  3:22 PM

## 2015-11-05 ENCOUNTER — Encounter (HOSPITAL_COMMUNITY): Payer: Self-pay | Admitting: Oncology

## 2015-11-05 ENCOUNTER — Encounter (HOSPITAL_COMMUNITY): Payer: Medicare Other | Attending: Oncology | Admitting: Oncology

## 2015-11-05 VITALS — BP 91/70 | HR 90 | Temp 97.9°F | Resp 22 | Wt 180.2 lb

## 2015-11-05 DIAGNOSIS — M810 Age-related osteoporosis without current pathological fracture: Secondary | ICD-10-CM | POA: Diagnosis not present

## 2015-11-05 DIAGNOSIS — C3490 Malignant neoplasm of unspecified part of unspecified bronchus or lung: Secondary | ICD-10-CM

## 2015-11-05 LAB — CBC WITH DIFFERENTIAL/PLATELET
Basophils Absolute: 0 10*3/uL (ref 0.0–0.1)
Basophils Relative: 0 %
Eosinophils Absolute: 0.2 10*3/uL (ref 0.0–0.7)
Eosinophils Relative: 2 %
HCT: 39.7 % (ref 36.0–46.0)
Hemoglobin: 12.3 g/dL (ref 12.0–15.0)
Lymphocytes Relative: 21 %
Lymphs Abs: 1.8 10*3/uL (ref 0.7–4.0)
MCH: 29.1 pg (ref 26.0–34.0)
MCHC: 31 g/dL (ref 30.0–36.0)
MCV: 93.9 fL (ref 78.0–100.0)
Monocytes Absolute: 0.9 10*3/uL (ref 0.1–1.0)
Monocytes Relative: 11 %
Neutro Abs: 5.5 10*3/uL (ref 1.7–7.7)
Neutrophils Relative %: 66 %
Platelets: 165 10*3/uL (ref 150–400)
RBC: 4.23 MIL/uL (ref 3.87–5.11)
RDW: 15.8 % — ABNORMAL HIGH (ref 11.5–15.5)
WBC: 8.4 10*3/uL (ref 4.0–10.5)

## 2015-11-05 LAB — COMPREHENSIVE METABOLIC PANEL
ALT: 19 U/L (ref 14–54)
AST: 18 U/L (ref 15–41)
Albumin: 3.7 g/dL (ref 3.5–5.0)
Alkaline Phosphatase: 49 U/L (ref 38–126)
Anion gap: 7 (ref 5–15)
BUN: 16 mg/dL (ref 6–20)
CO2: 30 mmol/L (ref 22–32)
Calcium: 9.1 mg/dL (ref 8.9–10.3)
Chloride: 100 mmol/L — ABNORMAL LOW (ref 101–111)
Creatinine, Ser: 1.21 mg/dL — ABNORMAL HIGH (ref 0.44–1.00)
GFR calc Af Amer: 48 mL/min — ABNORMAL LOW (ref >60)
GFR calc non Af Amer: 41 mL/min — ABNORMAL LOW (ref >60)
Glucose, Bld: 122 mg/dL — ABNORMAL HIGH (ref 65–99)
Potassium: 3.9 mmol/L (ref 3.5–5.1)
Sodium: 137 mmol/L (ref 135–145)
Total Bilirubin: 0.8 mg/dL (ref 0.3–1.2)
Total Protein: 7.2 g/dL (ref 6.5–8.1)

## 2015-11-05 NOTE — Progress Notes (Signed)
Angela Lawson presented for [employer]. Labs per MD order drawn via Peripheral Line 23 gauge needle inserted in right AC  Good blood return present. Procedure without incident.  Needle removed intact. Patient tolerated procedure well.

## 2015-11-05 NOTE — Patient Instructions (Signed)
[place] at [facility] Discharge Instructions  RECOMMENDATIONS MADE BY THE CONSULTANT AND ANY TEST RESULTS WILL BE SENT TO YOUR REFERRING PHYSICIAN.  Exam and discussion by Robynn Pane, PA-C Labs today CT of chest this month and again in 1 year Call with unexplained weight loss, increased shortness of breath or other concerns.  Follow-up in 1 year with CT of chest, Labs and office visit.  Thank you for choosing [place] at [facility] to provide your oncology and hematology care.  To afford each patient quality time with our provider, please arrive at least 15 minutes before your scheduled appointment time.    You need to re-schedule your appointment should you arrive 10 or more minutes late.  We strive to give you quality time with our providers, and arriving late affects you and other patients whose appointments are after yours.  Also, if you no show three or more times for appointments you may be dismissed from the clinic at the providers discretion.     Again, thank you for choosing [facility].  Our hope is that these requests will decrease the amount of time that you wait before being seen by our physicians.       _____________________________________________________________  Should you have questions after your visit to [facility], please contact our office at [phone] between the hours of 8:30 a.m. and 4:30 p.m.  Voicemails left after 4:30 p.m. will not be returned until the following business day.  For prescription refill requests, have your pharmacy contact our office.

## 2015-11-06 ENCOUNTER — Other Ambulatory Visit: Payer: Self-pay

## 2015-11-06 NOTE — Patient Outreach (Signed)
[facility]) Care Management  [place]  11/06/2015   Angela Lawson 03-27-1934 [id number]  Subjective: Telephone outreach to patient.  She reports she is doing pretty good.  Patient reports she continues to weigh daily.  Discussed weight parameters.  Patient reports last blood sugar 102.    COPD: Patient reports her breathing has been good but reports she gets short winded when she moves quickly.  Patient able to name methods of pursed lipped breath and other methods to conserve energy and breath.  Patient reprots she is only using her duo-neb about 2 times a day.  Reviewed with patient COPD action plan.  She verbalized understanding.    Objective:   Current Medications:  Current Outpatient Prescriptions  Medication Sig Dispense Refill  . albuterol (PROAIR HFA) 108 (90 BASE) MCG/ACT inhaler Inhale 2 puffs into the lungs every 4 (four) hours as needed for wheezing or shortness of breath.    Marland Kitchen alendronate (FOSAMAX) 70 MG tablet Take 70 mg by mouth once a week. On Fridays    . ALPRAZolam (XANAX) 0.25 MG tablet Take 0.25 mg by mouth 2 (two) times daily.  2  . aspirin EC 81 MG tablet Take 81 mg by mouth daily.    Marland Kitchen atorvastatin (LIPITOR) 80 MG tablet Take 1 tablet by mouth every morning.    Marland Kitchen buPROPion (WELLBUTRIN SR) 150 MG 12 hr tablet Take 150 mg by mouth 2 (two) times daily.    . Cholecalciferol (VITAMIN D-3) 1000 UNITS CAPS Take 1 capsule by mouth daily.    . fish oil-omega-3 fatty acids 1000 MG capsule Take 1 g by mouth 2 (two) times daily.     . Garlic 182 MG TABS Take 300 mg by mouth daily.    Marland Kitchen ipratropium-albuterol (DUONEB) 0.5-2.5 (3) MG/3ML SOLN Inhale 3 mLs into the lungs every 4 (four) hours as needed (wheezing/shortness of breath).     . linagliptin (TRADJENTA) 5 MG TABS tablet Take 5 mg by mouth daily.    Marland Kitchen losartan (COZAAR) 50 MG tablet Take 50 mg by mouth daily.  2  . meclizine (ANTIVERT) 25 MG tablet Take 1 tablet by mouth 3 (three) times daily as  needed for dizziness.     . metoprolol succinate (TOPROL-XL) 25 MG 24 hr tablet Take 1 tablet (25 mg total) by mouth daily. 90 tablet 3  . Naproxen Sodium (ALEVE PO) Take 1 tablet by mouth as needed.    Marland Kitchen omeprazole (PRILOSEC) 20 MG capsule Take 20 mg by mouth daily.    Marland Kitchen pyridoxine (B-6) 100 MG tablet Take 100 mg by mouth daily.    Marland Kitchen rOPINIRole (REQUIP) 0.25 MG tablet Take 0.25 mg by mouth 3 (three) times daily.     . furosemide (LASIX) 40 MG tablet Take 1 tablet (40 mg total) by mouth daily. 30 tablet 3  . gabapentin (NEURONTIN) 300 MG capsule Take 300 mg by mouth daily.      No current facility-administered medications for this visit.    Functional Status:  In your present state of health, do you have any difficulty performing the following activities: 09/13/2015 09/12/2015  Hearing? Y N  Vision? Y N  Difficulty concentrating or making decisions? N N  Walking or climbing stairs? Y N  Dressing or bathing? N N  Doing errands, shopping? N N  Preparing Food and eating ? N N  Using the Toilet? N N  In the past six months, have you accidently leaked urine? N N  Do you have problems with loss of bowel control? N N  Managing your Medications? N N  Managing your Finances? N N  Housekeeping or managing your Housekeeping? [name]    Fall/Depression Screening: PHQ 2/9 Scores 11/06/2015 10/09/2015 09/13/2015 09/12/2015 08/30/2015 08/08/2015  PHQ - 2 Score 0 0 2 0 0 0  PHQ- 9 Score - - 5 - - -    Assessment: Patient continues to benefit from health coach outreach for disease management.   Plan:  [facility] CM Care Plan Problem One        Most Recent Value   Care Plan Problem One  Knowledge deficit COPD   Role Documenting the Problem One  Health Coach   Care Plan for Problem One  Active   THN Long Term Goal (31-90 days)  Patient will be able to explain COPD zones on action plan within the next 90 days    THN Long Term Goal Start Date  09/12/15   Interventions for Problem One [place]  coach discussed with green and yellow zones of COPD action plan.   THN CM Short Term Goal #1 (0-30 days)  Patient will be able to describe COPD green zone within 30 days.     THN CM Short Term Goal #1 Start Date  10/09/15 [goal continued]   [facility] CM Short Term Goal #1 Met Date  11/06/15   Interventions for Short Term Goal #1  RN Health coach reviewed green zone with patient.    THN CM Short Term Goal #2 (0-30 days)  Patient will be able to describe techniques of controlling breath during episodes of shortness breath within 30 days.    THN CM Short Term Goal #2 Start Date  11/06/15 [goal continued]   Interventions for Short Term Goal #2  RN Health Coach using the teach back method had patient describe actions to conserve breath.     THN CM Short Term Goal #3 (0-30 days)  Patient will be able to name at least 2 symptoms of the COPD action plan yellow zone within 30 days.     THN CM Short Term Goal #3 Start Date  11/06/15   Interventions for Short Tern Goal #3  RN Health Coach reviewed yellow zone symptoms of COPD action plan.     RN Health Coach will contact patient within one month and patient agrees to next outreach.   [name], RN, MSN [place] [phone]

## 2015-11-07 ENCOUNTER — Ambulatory Visit (HOSPITAL_COMMUNITY)
Admission: RE | Admit: 2015-11-07 | Discharge: 2015-11-07 | Disposition: A | Discharge status: Home / Self Care | Payer: Medicare Other | Source: Ambulatory Visit | Attending: Oncology | Admitting: Oncology

## 2015-11-07 DIAGNOSIS — Z9981 Dependence on supplemental oxygen: Secondary | ICD-10-CM | POA: Diagnosis not present

## 2015-11-07 DIAGNOSIS — J449 Chronic obstructive pulmonary disease, unspecified: Secondary | ICD-10-CM | POA: Diagnosis not present

## 2015-11-07 DIAGNOSIS — I2510 Atherosclerotic heart disease of native coronary artery without angina pectoris: Secondary | ICD-10-CM | POA: Insufficient documentation

## 2015-11-07 DIAGNOSIS — R918 Other nonspecific abnormal finding of lung field: Secondary | ICD-10-CM | POA: Insufficient documentation

## 2015-11-07 DIAGNOSIS — Z23 Encounter for immunization: Secondary | ICD-10-CM | POA: Diagnosis not present

## 2015-11-07 DIAGNOSIS — Z902 Acquired absence of lung [part of]: Secondary | ICD-10-CM | POA: Diagnosis not present

## 2015-11-07 DIAGNOSIS — Z08 Encounter for follow-up examination after completed treatment for malignant neoplasm: Secondary | ICD-10-CM | POA: Diagnosis not present

## 2015-11-07 DIAGNOSIS — C3490 Malignant neoplasm of unspecified part of unspecified bronchus or lung: Secondary | ICD-10-CM | POA: Diagnosis not present

## 2015-11-07 DIAGNOSIS — R0600 Dyspnea, unspecified: Secondary | ICD-10-CM | POA: Diagnosis not present

## 2015-11-23 ENCOUNTER — Other Ambulatory Visit: Payer: Self-pay | Admitting: Licensed Clinical Social Worker

## 2015-11-23 ENCOUNTER — Encounter: Payer: Self-pay | Admitting: Licensed Clinical Social Worker

## 2015-11-23 NOTE — Patient Outreach (Signed)
Assessment:  CSW called client on 11/23/15 and spoke via phone with client. CSW verified identity of client. CSW and client spoke of client needs. Client said she has hearing challenges. She said she had a hearing test about one year ago but has not been able to afford to buy a hearing aid. She does not receive Medicaid.  She did call [facility] for Deaf and Hard  of Hearing in [place], [place] but was unable to receive assistance through that agency.  She said hearing aid would cost about $ 2,000.00 and she is not able to afford to buy such a hearing aid.  She said she has her prescribed medications and is taking medications as prescribed. She said she sometimes has difficulty purchasing needed food items. CSW informed client that she could contact [facility] in [place], [place] to talk with that agency about possible food assistance for client.  CSW encouraged client to see if her daughter could help her contact [facility] for possible help for client. Client said she went to [place] to talk about Biomedical scientist for client. She said that she was told by representative of that agency that she could only qualify for $5.00 of Food Stamps assistance per month. CSW encouraged client to contact area food pantries to see if she would be able to obtain some food assistance through those food pantries.  Client is also receiving telephonic support with [place]. CSW thanked client for phone conversation with CSW on 11/23/15.   Plan: Client to contact [facility] in next 30 days to inquire about possible food assistance for client. CSW to call client  in 4 weeks to assess needs of client at that time.  Norva Riffle.Semaj Kham MSW, LCSW Licensed Clinical Social Worker [facility] Care Management [phone]

## 2015-11-25 DIAGNOSIS — J449 Chronic obstructive pulmonary disease, unspecified: Secondary | ICD-10-CM | POA: Diagnosis not present

## 2015-11-26 ENCOUNTER — Other Ambulatory Visit: Payer: Self-pay

## 2015-11-26 NOTE — Patient Outreach (Signed)
[facility]) Care Management  [place]  11/26/2015   Angela Lawson [birth year] [id number]  Subjective: Telephone call to patient for monthly outreach.  Patient reports she is doing fine.  Denies emergency room visit or hospitalizations. Patient reports she got some strips for her one touch from mail order and they do not go with her machine.  Patient states she will call [employer] mail order to correct the order.    COPD: Patient reports that her breathing has been about the same but does get winded when she is more active.  Patient states she tries to pace herself so that she does not get too tired.  Discussed pursed lip breathing with patient and reviewed yellow zone of COPD action plan.  She verbalized understanding.   Objective:   Current Medications:  Current Outpatient Prescriptions  Medication Sig Dispense Refill  . albuterol (PROAIR HFA) 108 (90 BASE) MCG/ACT inhaler Inhale 2 puffs into the lungs every 4 (four) hours as needed for wheezing or shortness of breath.    Marland Kitchen alendronate (FOSAMAX) 70 MG tablet Take 70 mg by mouth once a week. On Fridays    . ALPRAZolam (XANAX) 0.25 MG tablet Take 0.25 mg by mouth 2 (two) times daily.  2  . aspirin EC 81 MG tablet Take 81 mg by mouth daily.    Marland Kitchen atorvastatin (LIPITOR) 80 MG tablet Take 1 tablet by mouth every morning.    Marland Kitchen buPROPion (WELLBUTRIN SR) 150 MG 12 hr tablet Take 150 mg by mouth 2 (two) times daily.    . Cholecalciferol (VITAMIN D-3) 1000 UNITS CAPS Take 1 capsule by mouth daily.    . fish oil-omega-3 fatty acids 1000 MG capsule Take 1 g by mouth 2 (two) times daily.     . furosemide (LASIX) 40 MG tablet Take 1 tablet (40 mg total) by mouth daily. 30 tablet 3  . gabapentin (NEURONTIN) 300 MG capsule Take 300 mg by mouth daily.     . Garlic 638 MG TABS Take 300 mg by mouth daily.    Marland Kitchen ipratropium-albuterol (DUONEB) 0.5-2.5 (3) MG/3ML SOLN Inhale 3 mLs into the lungs every 4 (four) hours as needed  (wheezing/shortness of breath).     . linagliptin (TRADJENTA) 5 MG TABS tablet Take 5 mg by mouth daily.    Marland Kitchen losartan (COZAAR) 50 MG tablet Take 50 mg by mouth daily.  2  . meclizine (ANTIVERT) 25 MG tablet Take 1 tablet by mouth 3 (three) times daily as needed for dizziness.     . metoprolol succinate (TOPROL-XL) 25 MG 24 hr tablet Take 1 tablet (25 mg total) by mouth daily. 90 tablet 3  . Naproxen Sodium (ALEVE PO) Take 1 tablet by mouth as needed.    Marland Kitchen omeprazole (PRILOSEC) 20 MG capsule Take 20 mg by mouth daily.    Marland Kitchen pyridoxine (B-6) 100 MG tablet Take 100 mg by mouth daily.    Marland Kitchen rOPINIRole (REQUIP) 0.25 MG tablet Take 0.25 mg by mouth 3 (three) times daily.      No current facility-administered medications for this visit.    Functional Status:  In your present state of health, do you have any difficulty performing the following activities: 11/23/2015 09/13/2015  Hearing? Angela Lawson  Vision? Y Y  Difficulty concentrating or making decisions? N N  Walking or climbing stairs? Y Y  Dressing or bathing? N N  Doing errands, shopping? N N  Preparing Food and eating ? N N  Using the  Toilet? N N  In the past six months, have you accidently leaked urine? N N  Do you have problems with loss of bowel control? - N  Managing your Medications? N N  Managing your Finances? - N  Housekeeping or managing your Housekeeping? Angela Lawson    Fall/Depression Screening: PHQ 2/9 Scores 11/26/2015 11/23/2015 11/06/2015 10/09/2015 09/13/2015 09/12/2015 08/30/2015  PHQ - 2 Score 0 0 0 0 2 0 0  PHQ- 9 Score - 3 - - 5 - -    Assessment: Patient continues to benefit from health coach outreach for disease management. Plan:  [facility] CM Care Plan Problem One        Most Recent Value   Care Plan Problem One  Knowledge deficit COPD   Role Documenting the Problem One  Health Coach   Care Plan for Problem One  Active   THN Long Term Goal (31-90 days)  Patient will be able to explain COPD zones on action plan within the next 90  days    THN Long Term Goal Start Date  09/12/15   Interventions for Problem One [place] discussed yellow zone of COPD Action Plan.     THN CM Short Term Goal #1 (0-30 days)  Patient will be able to describe COPD green zone within 30 days.     THN CM Short Term Goal #1 Start Date  10/09/15   [facility] CM Short Term Goal #1 Met Date  11/06/15   Interventions for Short Term Goal #1  RN Health coach reviewed green zone with patient.    THN CM Short Term Goal #2 (0-30 days)  Patient will be able to describe techniques of controlling breath during episodes of shortness breath within 30 days.    THN CM Short Term Goal #2 Start Date  11/26/15 [goal continued]   Interventions for Short Term Goal #2  RN Health Coach reinforced pursed lip breathing with patient.     THN CM Short Term Goal #3 (0-30 days)  Patient will be able to name at least 2 symptoms of the COPD action plan yellow zone within 30 days.     THN CM Short Term Goal #3 Start Date  11/26/15   Interventions for Short Tern Goal #3  [place] reinforced yellow zone of COPD action plan.      RN Health Coach will contact patient within one month and patient agrees to next outreach.    [name], RN, MSN [place] [phone]

## 2015-12-09 DIAGNOSIS — R69 Illness, unspecified: Secondary | ICD-10-CM | POA: Diagnosis not present

## 2015-12-21 ENCOUNTER — Encounter: Payer: Self-pay | Admitting: Licensed Clinical Social Worker

## 2015-12-21 ENCOUNTER — Encounter (HOSPITAL_COMMUNITY): Payer: Self-pay

## 2015-12-21 ENCOUNTER — Emergency Department (HOSPITAL_COMMUNITY)
Admission: EM | Admit: 2015-12-21 | Discharge: 2015-12-21 | Disposition: A | Discharge status: Home / Self Care | Payer: Medicare Other | Attending: Emergency Medicine | Admitting: Emergency Medicine

## 2015-12-21 ENCOUNTER — Other Ambulatory Visit: Payer: Self-pay | Admitting: Licensed Clinical Social Worker

## 2015-12-21 ENCOUNTER — Emergency Department (HOSPITAL_COMMUNITY): Payer: Medicare Other

## 2015-12-21 DIAGNOSIS — R079 Chest pain, unspecified: Secondary | ICD-10-CM | POA: Diagnosis not present

## 2015-12-21 DIAGNOSIS — I2510 Atherosclerotic heart disease of native coronary artery without angina pectoris: Secondary | ICD-10-CM | POA: Insufficient documentation

## 2015-12-21 DIAGNOSIS — E785 Hyperlipidemia, unspecified: Secondary | ICD-10-CM | POA: Insufficient documentation

## 2015-12-21 DIAGNOSIS — M1990 Unspecified osteoarthritis, unspecified site: Secondary | ICD-10-CM | POA: Insufficient documentation

## 2015-12-21 DIAGNOSIS — Z87891 Personal history of nicotine dependence: Secondary | ICD-10-CM | POA: Insufficient documentation

## 2015-12-21 DIAGNOSIS — I10 Essential (primary) hypertension: Secondary | ICD-10-CM | POA: Diagnosis not present

## 2015-12-21 DIAGNOSIS — Z9889 Other specified postprocedural states: Secondary | ICD-10-CM | POA: Diagnosis not present

## 2015-12-21 DIAGNOSIS — E119 Type 2 diabetes mellitus without complications: Secondary | ICD-10-CM | POA: Diagnosis not present

## 2015-12-21 DIAGNOSIS — Z85118 Personal history of other malignant neoplasm of bronchus and lung: Secondary | ICD-10-CM | POA: Insufficient documentation

## 2015-12-21 DIAGNOSIS — Z79899 Other long term (current) drug therapy: Secondary | ICD-10-CM | POA: Diagnosis not present

## 2015-12-21 DIAGNOSIS — J439 Emphysema, unspecified: Secondary | ICD-10-CM | POA: Diagnosis not present

## 2015-12-21 DIAGNOSIS — Z7982 Long term (current) use of aspirin: Secondary | ICD-10-CM | POA: Diagnosis not present

## 2015-12-21 DIAGNOSIS — R0789 Other chest pain: Secondary | ICD-10-CM | POA: Diagnosis not present

## 2015-12-21 DIAGNOSIS — M810 Age-related osteoporosis without current pathological fracture: Secondary | ICD-10-CM | POA: Diagnosis not present

## 2015-12-21 DIAGNOSIS — I509 Heart failure, unspecified: Secondary | ICD-10-CM | POA: Diagnosis not present

## 2015-12-21 DIAGNOSIS — J449 Chronic obstructive pulmonary disease, unspecified: Secondary | ICD-10-CM | POA: Diagnosis not present

## 2015-12-21 DIAGNOSIS — Z7984 Long term (current) use of oral hypoglycemic drugs: Secondary | ICD-10-CM | POA: Insufficient documentation

## 2015-12-21 DIAGNOSIS — R0602 Shortness of breath: Secondary | ICD-10-CM | POA: Diagnosis not present

## 2015-12-21 LAB — CBC
HCT: 37.8 % (ref 36.0–46.0)
Hemoglobin: 11.9 g/dL — ABNORMAL LOW (ref 12.0–15.0)
MCH: 30 pg (ref 26.0–34.0)
MCHC: 31.5 g/dL (ref 30.0–36.0)
MCV: 95.2 fL (ref 78.0–100.0)
Platelets: 178 10*3/uL (ref 150–400)
RBC: 3.97 MIL/uL (ref 3.87–5.11)
RDW: 15.8 % — ABNORMAL HIGH (ref 11.5–15.5)
WBC: 7.8 10*3/uL (ref 4.0–10.5)

## 2015-12-21 LAB — BASIC METABOLIC PANEL
Anion gap: 8 (ref 5–15)
BUN: 32 mg/dL — ABNORMAL HIGH (ref 6–20)
CO2: 30 mmol/L (ref 22–32)
Calcium: 8.9 mg/dL (ref 8.9–10.3)
Chloride: 100 mmol/L — ABNORMAL LOW (ref 101–111)
Creatinine, Ser: 1.98 mg/dL — ABNORMAL HIGH (ref 0.44–1.00)
GFR calc Af Amer: 26 mL/min — ABNORMAL LOW (ref >60)
GFR calc non Af Amer: 23 mL/min — ABNORMAL LOW (ref >60)
Glucose, Bld: 181 mg/dL — ABNORMAL HIGH (ref 65–99)
Potassium: 4.3 mmol/L (ref 3.5–5.1)
Sodium: 138 mmol/L (ref 135–145)

## 2015-12-21 LAB — BRAIN NATRIURETIC PEPTIDE: B Natriuretic Peptide: 13 pg/mL (ref 0.0–100.0)

## 2015-12-21 LAB — TROPONIN I
Troponin I: <0.03 ng/mL (ref <0.031)
Troponin I: <0.03 ng/mL (ref <0.031)

## 2015-12-21 MED ORDER — BENZONATATE 100 MG PO CAPS
100.0000 mg | ORAL_CAPSULE | Freq: Once | ORAL | Status: AC
  Administered 2015-12-21: 100 mg via ORAL
  Filled 2015-12-21: qty 1

## 2015-12-21 MED ORDER — BENZONATATE 100 MG PO CAPS: 100.0000 mg | ORAL_CAPSULE | Freq: Three times a day (TID) | ORAL | Status: DC

## 2015-12-21 MED ORDER — ALBUTEROL SULFATE (2.5 MG/3ML) 0.083% IN NEBU
5.0000 mg | INHALATION_SOLUTION | Freq: Once | RESPIRATORY_TRACT | Status: AC
  Administered 2015-12-21: 5 mg via RESPIRATORY_TRACT
  Filled 2015-12-21: qty 6

## 2015-12-21 MED ORDER — SODIUM CHLORIDE 0.9 % IV BOLUS (SEPSIS)
500.0000 mL | Freq: Once | INTRAVENOUS | Status: AC
  Administered 2015-12-21: 500 mL via INTRAVENOUS

## 2015-12-21 MED ORDER — IPRATROPIUM-ALBUTEROL 0.5-2.5 (3) MG/3ML IN SOLN
3.0000 mL | Freq: Once | RESPIRATORY_TRACT | Status: AC
  Administered 2015-12-21: 3 mL via RESPIRATORY_TRACT
  Filled 2015-12-21: qty 3

## 2015-12-21 MED ORDER — AZITHROMYCIN 250 MG PO TABS: 250.0000 mg | ORAL_TABLET | Freq: Every day | ORAL | Status: DC

## 2015-12-21 NOTE — ED Provider Notes (Signed)
CSN: [id number]     Arrival date & time 12/21/15  0605 History   First MD Initiated Contact with Patient 12/21/15 [phone]     Chief Complaint  Patient presents with  . Chest Pain     (Consider location/radiation/quality/duration/timing/severity/associated sxs/prior Treatment) Patient is a 82 y.o. female presenting with chest pain.  Chest Pain   Dull pain - started last night - she had more pain with moving / walking, She is not having the pain right now - She states that she had become upset last night - her GD is in college - she asked her at Christmas if her friend could visit and spend the week with her - after she got there - she won't leave - is homeless - and the friends mother won't help her - this upset her when she found out last night that the person would be there for a while.  She had CP when EMS arrived but on arrival to the ED she was CP free without meds.  She is having increased cough lately, productive of phlegm but no f/c.  She has had no swelling in the legs.  Past Medical History  Diagnosis Date  . Essential hypertension   . Arthritis   . Diverticulitis   . Type II diabetes mellitus ([place])   . H/O ventral hernia   . COPD (chronic obstructive pulmonary disease) ([place])   . Osteoporosis   . Adenocarcinoma of lung ([place])     Left lung 2009, resected  . Non-obstructive CAD     a. 04/2015 NSTEMI/Cath: LAD 10p, LCX 61m RCA 32m20d, EF 35-40 w/ apical ballooning.  . Takotsubo cardiomyopathy     a. 04/2015 Echo: EF 45-50%, mid-dist anterior/apical/inferoapical HK w/ hyperdynamic base. Gr 1 DD, mild AI, mild-mod MR, triv TR, PASP 487m;  b. 04/2015 LV gram: Ef 35-40% w/ apical ballooning.  . DMarland Kitchenslipidemia   . Ventricular bigeminy     a. 04/2015 in setting of NSTEMI/Takotsubo.  . CHF (congestive heart failure) [place] . Osteoporosis 11/04/2015    Managed by Dr. FanLegrand Rams Past Surgical History  Procedure Laterality Date  . Cholecystectomy    . Ectopic pregnancy surgery    .  Abdominal hysterectomy    . Lung cancer surgery    . Incisional hernia repair N/A 08/26/2013    Procedure: HERNIA REPAIR INCISIONAL WITH MESH;  Surgeon: MarJamesetta SoD;  Location: [facility];  Service: General;  Laterality: N/A;  . Colonoscopy N/A 09/18/2014    Procedure: COLONOSCOPY;  Surgeon: SanDanie BinderD;  Location: [facility];  Service: Endoscopy;  Laterality: N/A;  8:30 AM - moved to 10:30 - CRosendo Gros notify pt  . Cardiac catheterization N/A 04/17/2015    Procedure: Left Heart Cath and Coronary Angiography;  Surgeon: ThoTroy SineD;  Location: [facility];  Service: Cardiovascular;  Laterality: N/A;   Family History  Problem Relation Age of Onset  . Diabetes Mother   . Hypertension Mother   . Diabetes Father   . Asthma    . Cancer    . Colon cancer Neg Hx    Social History  Substance Use Topics  . Smoking status: Former Smoker -- 20 years    Types: Cigarettes    Quit date: 08/13/2006  . Smokeless tobacco: Never Used  . Alcohol Use: No   OB History    No data available     Review of Systems  Cardiovascular: Positive  for chest pain.  All other systems reviewed and are negative.     Allergies  Lisinopril  Home Medications   Prior to Admission medications   Medication Sig Start Date End Date Taking? Authorizing Provider  albuterol (PROAIR HFA) 108 (90 BASE) MCG/ACT inhaler Inhale 2 puffs into the lungs every 4 (four) hours as needed for wheezing or shortness of breath.   Yes Historical Provider, MD  alendronate (FOSAMAX) 70 MG tablet Take 70 mg by mouth once a week. On Fridays 03/14/15  Yes Historical Provider, MD  ALPRAZolam Duanne Moron) 0.25 MG tablet Take 0.25 mg by mouth 2 (two) times daily. 04/30/15  Yes Historical Provider, MD  aspirin EC 81 MG tablet Take 81 mg by mouth daily.   Yes Historical Provider, MD  atorvastatin (LIPITOR) 80 MG tablet Take 1 tablet by mouth every morning. 06/30/15  Yes Historical Provider, MD  buPROPion (WELLBUTRIN SR) 150 MG  12 hr tablet Take 150 mg by mouth 2 (two) times daily.   Yes Historical Provider, MD  Cholecalciferol (VITAMIN D-3) 1000 UNITS CAPS Take 1 capsule by mouth daily.   Yes Historical Provider, MD  fish oil-omega-3 fatty acids 1000 MG capsule Take 1 g by mouth 2 (two) times daily.    Yes Historical Provider, MD  furosemide (LASIX) 40 MG tablet Take 1 tablet (40 mg total) by mouth daily. 08/02/15  Yes Rosita Fire, MD  gabapentin (NEURONTIN) 300 MG capsule Take 300 mg by mouth daily.    Yes Historical Provider, MD  Garlic 852 MG TABS Take 300 mg by mouth daily.   Yes Historical Provider, MD  ipratropium-albuterol (DUONEB) 0.5-2.5 (3) MG/3ML SOLN Inhale 3 mLs into the lungs every 4 (four) hours as needed (wheezing/shortness of breath).  09/05/14  Yes Historical Provider, MD  linagliptin (TRADJENTA) 5 MG TABS tablet Take 5 mg by mouth daily.   Yes Historical Provider, MD  losartan (COZAAR) 50 MG tablet Take 50 mg by mouth daily. 06/29/15  Yes Historical Provider, MD  meclizine (ANTIVERT) 25 MG tablet Take 1 tablet by mouth 3 (three) times daily as needed for dizziness.  09/05/14  Yes Historical Provider, MD  metoprolol succinate (TOPROL-XL) 25 MG 24 hr tablet Take 1 tablet (25 mg total) by mouth daily. 06/26/15  Yes Jettie Booze, MD  Naproxen Sodium (ALEVE PO) Take 1 tablet by mouth as needed.   Yes Historical Provider, MD  nitroGLYCERIN (NITROSTAT) 0.4 MG SL tablet Place 0.4 mg under the tongue every 5 (five) minutes as needed for chest pain.   Yes Historical Provider, MD  omeprazole (PRILOSEC) 20 MG capsule Take 20 mg by mouth daily.   Yes Historical Provider, MD  pyridoxine (B-6) 100 MG tablet Take 100 mg by mouth daily.   Yes Historical Provider, MD  rOPINIRole (REQUIP) 0.25 MG tablet Take 0.25 mg by mouth 3 (three) times daily.  09/05/14  Yes Historical Provider, MD  azithromycin (ZITHROMAX Z-PAK) 250 MG tablet Take 1 tablet (250 mg total) by mouth daily. '500mg'$  PO day 1, then '250mg'$  PO days 205  12/21/15   Noemi Chapel, MD  benzonatate (TESSALON) 100 MG capsule Take 1 capsule (100 mg total) by mouth every 8 (eight) hours. 12/21/15   Noemi Chapel, MD   BP 133/56 mmHg  Pulse 78  Temp(Src) 98.2 F (36.8 C) (Oral)  Resp 16  Ht '5\' 4"'$  (1.626 m)  Wt 179 lb (81.194 kg)  BMI 30.71 kg/m2  SpO2 93% Physical Exam  Constitutional: She appears well-developed and well-nourished. No  distress.  HENT:  Head: Normocephalic and atraumatic.  Mouth/Throat: Oropharynx is clear and moist. No oropharyngeal exudate.  Eyes: Conjunctivae and EOM are normal. Pupils are equal, round, and reactive to light. Right eye exhibits no discharge. Left eye exhibits no discharge. No scleral icterus.  Neck: Normal range of motion. Neck supple. No JVD present. No thyromegaly present.  Cardiovascular: Normal rate, regular rhythm, normal heart sounds and intact distal pulses.  Exam reveals no gallop and no friction rub.   No murmur heard. No JVD  Pulmonary/Chest: Effort normal. No respiratory distress. She has wheezes. She has no rales.  No distress - on home 3L - at 99%.  Abdominal: Soft. Bowel sounds are normal. She exhibits no distension and no mass. There is no tenderness.  Musculoskeletal: Normal range of motion. She exhibits no edema or tenderness.  No edema  Lymphadenopathy:    She has no cervical adenopathy.  Neurological: She is alert. Coordination normal.  Skin: Skin is warm and dry. No rash noted. No erythema.  Psychiatric: She has a normal mood and affect. Her behavior is normal.  Nursing note and vitals reviewed.   ED Course  Procedures (including critical care time) Labs Review Labs Reviewed  CBC - Abnormal; Notable for the following:    Hemoglobin 11.9 (*)    RDW 15.8 (*)    All other components within normal limits  BASIC METABOLIC PANEL - Abnormal; Notable for the following:    Chloride 100 (*)    Glucose, Bld 181 (*)    BUN 32 (*)    Creatinine, Ser 1.98 (*)    GFR calc non Af Amer 23  (*)    GFR calc Af Amer 26 (*)    All other components within normal limits  TROPONIN I  BRAIN NATRIURETIC PEPTIDE  TROPONIN I    Imaging Review Dg Chest 2 View  12/21/2015  CLINICAL DATA:  82 year old female with chest pain and shortness of breath EXAM: CHEST  2 VIEW COMPARISON:  CT dated 11/07/2015 at an radiograph dated 08/01/2015 FINDINGS: Two views of the chest demonstrate emphysematous changes of the lungs. There is a stable area of opacity at the left lung base similar to the prior study likely combination of atelectasis/ scarring as seen on the CT. There is stable cardiac silhouette. The osseous structures are grossly unremarkable IMPRESSION: Emphysema with stable appearing left lung base opacity likely atelectasis/scarring. No significant interval change. Electronically Signed   By: Anner Crete M.D.   On: 12/21/2015 07:28   I have personally reviewed and evaluated these images and lab results as part of my medical decision-making.   EKG Interpretation   Date/Time:  Friday December 21 2015 06:18:38 EST Ventricular Rate:  73 PR Interval:  158 QRS Duration: 88 QT Interval:  413 QTC Calculation: 455 R Axis:   59 Text Interpretation:  Sinus rhythm RSR' in V1 or V2, right VCD or RVH  Baseline wander in lead(s) II aVF No significant change since last tracing  other than ventricular bigeminy has resolved Confirmed by WARD,  DO,  KRISTEN ([id number]) on 12/21/2015 6:27:48 AM      MDM   Final diagnoses:  Chest pain, unspecified chest pain type    The pt has no more CP throughout stay - has normal VS - has second ECG which is normal and second troponin which is normal = no evidence for MI - xray without acute findings - labs with slight increse in Cr, fluids given, pt feels at baseline  on her 3 L - no pneumonia, no CHF, no peripheral edema.   EKG Interpretation  Date/Time:  Friday December 21 2015 09:23:03 EST Ventricular Rate:  70 PR Interval:  165 QRS Duration: 91 QT  Interval:  417 QTC Calculation: 450 R Axis:   38 Text Interpretation:  Sinus rhythm RSR' in V1 or V2, right VCD or RVH since last tracing no significant change Confirmed by Neal Oshea  MD, Alyxandra Tenbrink ([id number]) on 12/21/2015 9:34:25 AM      No CP for > 3 hours - has no signso f MI - pt in agreement to d/c - back if recurrent sx.  Requested cough medicine - possible bronchitis with cough / sob.    Meds given in ED:  Medications  ipratropium-albuterol (DUONEB) 0.5-2.5 (3) MG/3ML nebulizer solution 3 mL (3 mLs Nebulization Given 12/21/15 0711)  albuterol (PROVENTIL) (2.5 MG/3ML) 0.083% nebulizer solution 5 mg (5 mg Nebulization Given 12/21/15 0709)  benzonatate (TESSALON) capsule 100 mg (100 mg Oral Given 12/21/15 0722)  sodium chloride 0.9 % bolus 500 mL (0 mLs Intravenous Stopped 12/21/15 1000)    New Prescriptions   AZITHROMYCIN (ZITHROMAX Z-PAK) 250 MG TABLET    Take 1 tablet (250 mg total) by mouth daily. '500mg'$  PO day 1, then '250mg'$  PO days 205   BENZONATATE (TESSALON) 100 MG CAPSULE    Take 1 capsule (100 mg total) by mouth every 8 (eight) hours.      Noemi Chapel, MD 12/21/15 1058

## 2015-12-21 NOTE — ED Provider Notes (Signed)
MSE was initiated and I personally evaluated the patient and placed orders (if any) at  6:36 AM on December 21, 2015.  The patient appears stable so that the remainder of the MSE may be completed by another provider.  Pt is a 82 y.o. female with history of COPD, takotsubo's cardiomyopathy, nonobstructive coronary artery disease who presents to the emergency department with right-sided chest pain that she describes as a sharp, dull pain without radiation that started at 10 PM. States that she called 911 and they instructed her to take aspirin and now her pain is gone. She has chronic shortness of breath which she states unchanged. Has had nonproductive cough which she also reports is unchanged. Denies any fever. States that this feels somewhat like her prior COPD exacerbations. On exam, patient is hematologically stable. She does have some scattered mild expiratory wheezing but good aeration. Speaking full sentences. No respiratory distress. She is chronically on oxygen. No JVD or lower extremity swelling. Will start cardiac workup with labs, chest x-ray. EKG shows no ischemic changes.  Cath May 2016 -    Prox LAD lesion, 10% stenosed.  Mid Cx lesion, 20% stenosed.  Mid RCA lesion, 30% stenosed.  Dist RCA lesion, 20% stenosed.  Moderately severe left ventricular dysfunction with severe hypo-to akinesis involving the mid distal anterolateral wall apex and distal inferior wall in a pattern suggestive of possible Takotsubu cardiomyopathy. Global ejection fraction is 35-40%. There is vigorous contractility of the basal walls.  Echo August 2016 - EF 33-61%, grade 1 diastolic dysfunction    EKG Interpretation  Date/Time:  Friday December 21 2015 06:18:38 EST Ventricular Rate:  73 PR Interval:  158 QRS Duration: 88 QT Interval:  413 QTC Calculation: 455 R Axis:   59 Text Interpretation:  Sinus rhythm RSR' in V1 or V2, right VCD or RVH Baseline wander in lead(s) II aVF No significant change  since last tracing other than ventricular bigeminy has resolved Confirmed by WARD,  DO, KRISTEN ([id number]) on 12/21/2015 6:27:48 AM        [place], DO 12/21/15 7530

## 2015-12-21 NOTE — Discharge Instructions (Signed)
Your testing today showed some decreased renal function We gave you some fluids to help - this needs to be rechecked in 1 week at your doctors office Cough medicine (Tessalon) every 8 hours as needed Zithromax for 5 days If you have worsening chest pain  Come back to the ER immediately!  Please obtain all of your results from medical records or have your doctors office obtain the results - share them with your doctor - you should be seen at your doctors office in the next 2 days. Call today to arrange your follow up. Take the medications as prescribed. Please review all of the medicines and only take them if you do not have an allergy to them. Please be aware that if you are taking birth control pills, taking other prescriptions, ESPECIALLY ANTIBIOTICS may make the birth control ineffective - if this is the case, either do not engage in sexual activity or use alternative methods of birth control such as condoms until you have finished the medicine and your family doctor says it is OK to restart them. If you are on a blood thinner such as COUMADIN, be aware that any other medicine that you take may cause the coumadin to either work too much, or not enough - you should have your coumadin level rechecked in next 7 days if this is the case.  ?  It is also a possibility that you have an allergic reaction to any of the medicines that you have been prescribed - Everybody reacts differently to medications and while MOST people have no trouble with most medicines, you may have a reaction such as nausea, vomiting, rash, swelling, shortness of breath. If this is the case, please stop taking the medicine immediately and contact your physician.  ?  You should return to the ER if you develop severe or worsening symptoms.

## 2015-12-21 NOTE — Patient Outreach (Signed)
Assessment:  CSW called home phone number of client on 12/21/15 and spoke via phone with client on 12/21/15. CSW verified client identity. Angela Lawson and  CSW spoke of client needs. Client said she had gone to the  emergency room at [facility] this morning for care but was much better now and was back home.  Client said she has her prescribed medications and is taking medications as prescribed. She is attending her scheduled medical appointments. She sees Angela Lawson as her primary care physician. She has family support from her daughter.  CSW and client spoke of client care plan goal. Client has not called [facility] to inquire about food support for client.  CSW encouraged client to call [facility] at [phone]. [phone] to inquire about food support for client. CSW informed client that CSW planned to discharge client from [place] only today since client did not mention any other current CSW needs  Client will continue to receive phone calls as scheduled with [name].  Client was in agreement with this plan and agreed for CSW to discharge client on 12/21/15 from [place] services only. CSW thanked client for phone conversation with CSW on 12/21/15.   Plan:  CSW is discharging Angela Lawson from [facility] CSW services only on 12/21/15. CSW to inform Angela Lawson that [place] discharged client from [place] services only on 12/21/15. CSW to fax letter to Angela Lawson informing Angela Lawson that [place] discharged client from [place] services only on 12/21/15.   Angela Lawson.Angela Lawson MSW, LCSW Licensed Clinical Social Worker [facility] Care Management [phone]

## 2015-12-21 NOTE — ED Notes (Signed)
MD at bedside. 

## 2015-12-21 NOTE — ED Notes (Signed)
Pt in by [facility] ems for c/o cough, sob, chest pain since yesterday.  Pt denies pain at this time.

## 2015-12-21 NOTE — ED Notes (Signed)
Resp called for breathing treatment.   

## 2015-12-25 ENCOUNTER — Other Ambulatory Visit: Payer: Self-pay

## 2015-12-25 NOTE — Patient Outreach (Signed)
[place] [facility]) Care Management  [place]  12/25/2015   Angela Lawson [birth year] [id number]  Subjective: Telephone call to patient for monthly outreach.  Patient reports she is doing good. Patient shares she went to the emergency room last week due to not being able to get phlegm clear.  She reports she got a few breathing treatments and that she felt better.  Discussed with patient COPD and yellow zone of action plan.  She verbalized understanding and states she has a follow up appointment with her primary doctor next week.  Patient denies any present concerns.    Objective:   Current Medications:  Current Outpatient Prescriptions  Medication Sig Dispense Refill  . albuterol (PROAIR HFA) 108 (90 BASE) MCG/ACT inhaler Inhale 2 puffs into the lungs every 4 (four) hours as needed for wheezing or shortness of breath.    Marland Kitchen alendronate (FOSAMAX) 70 MG tablet Take 70 mg by mouth once a week. On Fridays    . ALPRAZolam (XANAX) 0.25 MG tablet Take 0.25 mg by mouth 2 (two) times daily.  2  . aspirin EC 81 MG tablet Take 81 mg by mouth daily.    Marland Kitchen atorvastatin (LIPITOR) 80 MG tablet Take 1 tablet by mouth every morning.    . benzonatate (TESSALON) 100 MG capsule Take 1 capsule (100 mg total) by mouth every 8 (eight) hours. 21 capsule 0  . buPROPion (WELLBUTRIN SR) 150 MG 12 hr tablet Take 150 mg by mouth 2 (two) times daily.    . Cholecalciferol (VITAMIN D-3) 1000 UNITS CAPS Take 1 capsule by mouth daily.    . fish oil-omega-3 fatty acids 1000 MG capsule Take 1 g by mouth 2 (two) times daily.     . furosemide (LASIX) 40 MG tablet Take 1 tablet (40 mg total) by mouth daily. 30 tablet 3  . gabapentin (NEURONTIN) 300 MG capsule Take 300 mg by mouth daily.     . Garlic 354 MG TABS Take 300 mg by mouth daily.    Marland Kitchen ipratropium-albuterol (DUONEB) 0.5-2.5 (3) MG/3ML SOLN Inhale 3 mLs into the lungs every 4 (four) hours as needed (wheezing/shortness of breath).     . linagliptin  (TRADJENTA) 5 MG TABS tablet Take 5 mg by mouth daily.    Marland Kitchen losartan (COZAAR) 50 MG tablet Take 50 mg by mouth daily.  2  . meclizine (ANTIVERT) 25 MG tablet Take 1 tablet by mouth 3 (three) times daily as needed for dizziness.     . metoprolol succinate (TOPROL-XL) 25 MG 24 hr tablet Take 1 tablet (25 mg total) by mouth daily. 90 tablet 3  . Naproxen Sodium (ALEVE PO) Take 1 tablet by mouth as needed.    . nitroGLYCERIN (NITROSTAT) 0.4 MG SL tablet Place 0.4 mg under the tongue every 5 (five) minutes as needed for chest pain.    Marland Kitchen omeprazole (PRILOSEC) 20 MG capsule Take 20 mg by mouth daily.    Marland Kitchen pyridoxine (B-6) 100 MG tablet Take 100 mg by mouth daily.    Marland Kitchen rOPINIRole (REQUIP) 0.25 MG tablet Take 0.25 mg by mouth 3 (three) times daily.     Marland Kitchen azithromycin (ZITHROMAX Z-PAK) 250 MG tablet Take 1 tablet (250 mg total) by mouth daily. '500mg'$  PO day 1, then '250mg'$  PO days 205 (Patient not taking: Reported on 12/25/2015) 6 tablet 0   No current facility-administered medications for this visit.    Functional Status:  In your present state of health, do you have any difficulty  performing the following activities: 12/21/2015 11/23/2015  Hearing? [name]  Vision? Y Y  Difficulty concentrating or making decisions? N N  Walking or climbing stairs? Y Y  Dressing or bathing? N N  Doing errands, shopping? N N  Preparing Food and eating ? N N  Using the Toilet? N N  In the past six months, have you accidently leaked urine? N N  Do you have problems with loss of bowel control? N -  Managing your Medications? N N  Managing your Finances? N -  Housekeeping or managing your Housekeeping? [name]    Fall/Depression Screening: PHQ 2/9 Scores 12/25/2015 12/21/2015 11/26/2015 11/23/2015 11/06/2015 10/09/2015 09/13/2015  PHQ - 2 Score 0 1 0 0 0 0 2  PHQ- 9 Score - 4 - 3 - - 5    Assessment:  Patient continues to benefit from health coach outreach for disease management.   Plan:  [facility] CM Care Plan Problem One         Most Recent Value   Care Plan Problem One  Knowledge deficit COPD   Role Documenting the Problem One  Health Coach   Care Plan for Problem One  Active   [facility]N Long Term Goal (31-90 days)  Patient will be able to explain COPD zones on action plan within the next 90 days    [facility]N Long Term Goal Start Date  12/25/15 [goal continued]   Interventions for Problem One Long Term Goal  RN Health Coach reiterated yellow zone of COPD zone chart.     THN CM Short Term Goal #2 (0-30 days)  Patient will be able to describe techniques of controlling breath during episodes of shortness breath within 30 days.    THN CM Short Term Goal #2 Start Date  12/25/15 [goal continued]   Interventions for Short Term Goal #2  RN Health discussed with patient pursed lip breathing and coughing up phlegm.    THN CM Short Term Goal #3 (0-30 days)  Patient will be able to name at least 2 symptoms of the COPD action plan yellow zone within 30 days.     THN CM Short Term Goal #3 Start Date  11/26/15   Interventions for Short Tern Goal #3  RN Health Coach reiterated yellow zone of COPD action plan.      RN Health Coach will send update to physician.   RN Health Coach will contact patient within one month and patient agrees to next outreach.    [name], RN, MSN [place] [phone]

## 2015-12-26 DIAGNOSIS — J449 Chronic obstructive pulmonary disease, unspecified: Secondary | ICD-10-CM | POA: Diagnosis not present

## 2016-01-01 DIAGNOSIS — E119 Type 2 diabetes mellitus without complications: Secondary | ICD-10-CM | POA: Diagnosis not present

## 2016-01-01 DIAGNOSIS — N183 Chronic kidney disease, stage 3 (moderate): Secondary | ICD-10-CM | POA: Diagnosis not present

## 2016-01-01 DIAGNOSIS — I5181 Takotsubo syndrome: Secondary | ICD-10-CM | POA: Diagnosis not present

## 2016-01-01 DIAGNOSIS — N390 Urinary tract infection, site not specified: Secondary | ICD-10-CM | POA: Diagnosis not present

## 2016-01-01 DIAGNOSIS — E1165 Type 2 diabetes mellitus with hyperglycemia: Secondary | ICD-10-CM | POA: Diagnosis not present

## 2016-01-01 DIAGNOSIS — J449 Chronic obstructive pulmonary disease, unspecified: Secondary | ICD-10-CM | POA: Diagnosis not present

## 2016-01-17 ENCOUNTER — Other Ambulatory Visit: Payer: Self-pay

## 2016-01-17 NOTE — Patient Outreach (Signed)
[place] [facility]) Care Management  01/17/2016  Angela Lawson Aug 07, 1934 [id number]   Inbound voice message received from patient stating she has lost her Diabetes Educational materials on what "I can and can not eat."  Patient requesting materials be sent to her again.  RN CM notified assigned Health Coach of this patient request.    [name], RN, BSN, [facility], [place] Management Care Management Coordinator [phone] Direct [phone] Cell [phone] Office [phone] Fax

## 2016-01-17 NOTE — Patient Outreach (Signed)
[facility]) Care Management  01/17/2016  DRENDA SOBECKI 1934-12-04 [id number]  Telephone call to patient after receiving a message about patient wanting diabetic information.  No answer.  HIPAA compliant voice message left.    [name], RN, MSN [place] [phone]

## 2016-01-17 NOTE — Progress Notes (Signed)
This encounter was created in error - please disregard.

## 2016-01-22 ENCOUNTER — Other Ambulatory Visit: Payer: Self-pay

## 2016-01-22 NOTE — Patient Outreach (Signed)
[facility]) Care Management  [place]  01/22/2016   Angela Lawson 06-20-1934 [id number]  Subjective: Telephone call to patient for monthly outreach.  Patient reports that she has been up and about this morning and has some increased shortness of breath.  However, patient reports it is her usual when she is more active.  Discussed with patient use of purse lipped breathing and rest during episodes of shortness of breath.  Also discussed with patient COPD yellow zone symptoms to be watchful for.  She verbalized understanding.  Patient reports she got her strips from [place] and that she is checking her sugar.  She reports this mornings sugar was 140.  Patient voices no concerns.   Objective:   Current Medications:  Current Outpatient Prescriptions  Medication Sig Dispense Refill  . albuterol (PROAIR HFA) 108 (90 BASE) MCG/ACT inhaler Inhale 2 puffs into the lungs every 4 (four) hours as needed for wheezing or shortness of breath.    Marland Kitchen alendronate (FOSAMAX) 70 MG tablet Take 70 mg by mouth once a week. On Fridays    . ALPRAZolam (XANAX) 0.25 MG tablet Take 0.25 mg by mouth 2 (two) times daily.  2  . aspirin EC 81 MG tablet Take 81 mg by mouth daily.    Marland Kitchen atorvastatin (LIPITOR) 80 MG tablet Take 1 tablet by mouth every morning.    . benzonatate (TESSALON) 100 MG capsule Take 1 capsule (100 mg total) by mouth every 8 (eight) hours. 21 capsule 0  . buPROPion (WELLBUTRIN SR) 150 MG 12 hr tablet Take 150 mg by mouth 2 (two) times daily.    . Cholecalciferol (VITAMIN D-3) 1000 UNITS CAPS Take 1 capsule by mouth daily.    . fish oil-omega-3 fatty acids 1000 MG capsule Take 1 g by mouth 2 (two) times daily.     . furosemide (LASIX) 40 MG tablet Take 1 tablet (40 mg total) by mouth daily. 30 tablet 3  . gabapentin (NEURONTIN) 300 MG capsule Take 300 mg by mouth daily.     Marland Kitchen ipratropium-albuterol (DUONEB) 0.5-2.5 (3) MG/3ML SOLN Inhale 3 mLs into the lungs every 4 (four)  hours as needed (wheezing/shortness of breath).     . linagliptin (TRADJENTA) 5 MG TABS tablet Take 5 mg by mouth daily.    Marland Kitchen losartan (COZAAR) 50 MG tablet Take 50 mg by mouth daily.  2  . meclizine (ANTIVERT) 25 MG tablet Take 1 tablet by mouth 3 (three) times daily as needed for dizziness.     . metoprolol succinate (TOPROL-XL) 25 MG 24 hr tablet Take 1 tablet (25 mg total) by mouth daily. 90 tablet 3  . Naproxen Sodium (ALEVE PO) Take 1 tablet by mouth as needed.    . nitroGLYCERIN (NITROSTAT) 0.4 MG SL tablet Place 0.4 mg under the tongue every 5 (five) minutes as needed for chest pain.    Marland Kitchen omeprazole (PRILOSEC) 20 MG capsule Take 20 mg by mouth daily.    Marland Kitchen pyridoxine (B-6) 100 MG tablet Take 100 mg by mouth daily.    Marland Kitchen rOPINIRole (REQUIP) 0.25 MG tablet Take 0.25 mg by mouth 3 (three) times daily.     Marland Kitchen azithromycin (ZITHROMAX Z-PAK) 250 MG tablet Take 1 tablet (250 mg total) by mouth daily. '500mg'$  PO day 1, then '250mg'$  PO days 205 (Patient not taking: Reported on 12/25/2015) 6 tablet 0  . Garlic 867 MG TABS Take 300 mg by mouth daily.     No current facility-administered medications  for this visit.    Functional Status:  In your present state of health, do you have any difficulty performing the following activities: 12/21/2015 11/23/2015  Hearing? [name]  Vision? Y Y  Difficulty concentrating or making decisions? N N  Walking or climbing stairs? Y Y  Dressing or bathing? N N  Doing errands, shopping? N N  Preparing Food and eating ? N N  Using the Toilet? N N  In the past six months, have you accidently leaked urine? N N  Do you have problems with loss of bowel control? N -  Managing your Medications? N N  Managing your Finances? N -  Housekeeping or managing your Housekeeping? [name]    Fall/Depression Screening: PHQ 2/9 Scores 01/22/2016 12/25/2015 12/21/2015 11/26/2015 11/23/2015 11/06/2015 10/09/2015  PHQ - 2 Score 0 0 1 0 0 0 0  PHQ- 9 Score - - 4 - 3 - -    Assessment: Patient  continues to benefit from health coach outreach for disease management and support.    Plan:  [facility] CM Care Plan Problem One        Most Recent Value   Care Plan Problem One  Knowledge deficit COPD   Role Documenting the Problem One  Health Coach   Care Plan for Problem One  Active   THN Long Term Goal (31-90 days)  Patient will be able to explain COPD zones on action plan within the next 90 days    THN Long Term Goal Start Date  12/25/15 [goal continued]   Interventions for Problem One Long Term Goal  RN Health Coach reiterated yellow zone of COPD zone chart.     THN CM Short Term Goal #2 (0-30 days)  Patient will be able to describe techniques of controlling breath during episodes of shortness breath within 30 days.    THN CM Short Term Goal #2 Start Date  01/22/16 [goal continued]   Interventions for Short Term Goal #2  RN Health reinterated with patient pursed lip breathing and coughing up phlegm.    THN CM Short Term Goal #3 (0-30 days)  Patient will be able to name at least 2 symptoms of the COPD action plan yellow zone within 30 days.     THN CM Short Term Goal #3 Start Date  01/22/16   Interventions for Short Tern Goal #3  RN Health Coach reviewed yellow zone of COPD action plan.      RN Health Coach will contact patient within one month and patient agrees to next outreach.    [name], RN, MSN [place] [phone]

## 2016-01-24 ENCOUNTER — Emergency Department (HOSPITAL_COMMUNITY): Payer: Medicare Other

## 2016-01-24 ENCOUNTER — Emergency Department (HOSPITAL_COMMUNITY)
Admission: EM | Admit: 2016-01-24 | Discharge: 2016-01-24 | Disposition: A | Discharge status: Home / Self Care | Payer: Medicare Other | Attending: Emergency Medicine | Admitting: Emergency Medicine

## 2016-01-24 ENCOUNTER — Encounter (HOSPITAL_COMMUNITY): Payer: Self-pay

## 2016-01-24 DIAGNOSIS — Z79899 Other long term (current) drug therapy: Secondary | ICD-10-CM | POA: Insufficient documentation

## 2016-01-24 DIAGNOSIS — I10 Essential (primary) hypertension: Secondary | ICD-10-CM | POA: Insufficient documentation

## 2016-01-24 DIAGNOSIS — Z85118 Personal history of other malignant neoplasm of bronchus and lung: Secondary | ICD-10-CM | POA: Diagnosis not present

## 2016-01-24 DIAGNOSIS — R05 Cough: Secondary | ICD-10-CM | POA: Diagnosis not present

## 2016-01-24 DIAGNOSIS — Z7982 Long term (current) use of aspirin: Secondary | ICD-10-CM | POA: Diagnosis not present

## 2016-01-24 DIAGNOSIS — J441 Chronic obstructive pulmonary disease with (acute) exacerbation: Secondary | ICD-10-CM | POA: Diagnosis not present

## 2016-01-24 DIAGNOSIS — Z8719 Personal history of other diseases of the digestive system: Secondary | ICD-10-CM | POA: Diagnosis not present

## 2016-01-24 DIAGNOSIS — I2510 Atherosclerotic heart disease of native coronary artery without angina pectoris: Secondary | ICD-10-CM | POA: Diagnosis not present

## 2016-01-24 DIAGNOSIS — E785 Hyperlipidemia, unspecified: Secondary | ICD-10-CM | POA: Insufficient documentation

## 2016-01-24 DIAGNOSIS — R069 Unspecified abnormalities of breathing: Secondary | ICD-10-CM | POA: Diagnosis not present

## 2016-01-24 DIAGNOSIS — I509 Heart failure, unspecified: Secondary | ICD-10-CM | POA: Insufficient documentation

## 2016-01-24 DIAGNOSIS — E119 Type 2 diabetes mellitus without complications: Secondary | ICD-10-CM | POA: Insufficient documentation

## 2016-01-24 DIAGNOSIS — M1990 Unspecified osteoarthritis, unspecified site: Secondary | ICD-10-CM | POA: Diagnosis not present

## 2016-01-24 DIAGNOSIS — Z9889 Other specified postprocedural states: Secondary | ICD-10-CM | POA: Insufficient documentation

## 2016-01-24 DIAGNOSIS — R0602 Shortness of breath: Secondary | ICD-10-CM | POA: Diagnosis not present

## 2016-01-24 DIAGNOSIS — Z87891 Personal history of nicotine dependence: Secondary | ICD-10-CM | POA: Diagnosis not present

## 2016-01-24 LAB — CBC WITH DIFFERENTIAL/PLATELET
Basophils Absolute: 0 10*3/uL (ref 0.0–0.1)
Basophils Relative: 0 %
Eosinophils Absolute: 0.3 10*3/uL (ref 0.0–0.7)
Eosinophils Relative: 4 %
HCT: 40 % (ref 36.0–46.0)
Hemoglobin: 12.7 g/dL (ref 12.0–15.0)
Lymphocytes Relative: 18 %
Lymphs Abs: 1.3 10*3/uL (ref 0.7–4.0)
MCH: 30.5 pg (ref 26.0–34.0)
MCHC: 31.8 g/dL (ref 30.0–36.0)
MCV: 96.2 fL (ref 78.0–100.0)
Monocytes Absolute: 0.4 10*3/uL (ref 0.1–1.0)
Monocytes Relative: 6 %
Neutro Abs: 5 10*3/uL (ref 1.7–7.7)
Neutrophils Relative %: 72 %
Platelets: 173 10*3/uL (ref 150–400)
RBC: 4.16 MIL/uL (ref 3.87–5.11)
RDW: 14.6 % (ref 11.5–15.5)
WBC: 7 10*3/uL (ref 4.0–10.5)

## 2016-01-24 LAB — BASIC METABOLIC PANEL
Anion gap: 5 (ref 5–15)
BUN: 21 mg/dL — ABNORMAL HIGH (ref 6–20)
CO2: 30 mmol/L (ref 22–32)
Calcium: 9.1 mg/dL (ref 8.9–10.3)
Chloride: 101 mmol/L (ref 101–111)
Creatinine, Ser: 1.46 mg/dL — ABNORMAL HIGH (ref 0.44–1.00)
GFR calc Af Amer: 38 mL/min — ABNORMAL LOW (ref >60)
GFR calc non Af Amer: 33 mL/min — ABNORMAL LOW (ref >60)
Glucose, Bld: 180 mg/dL — ABNORMAL HIGH (ref 65–99)
Potassium: 4.3 mmol/L (ref 3.5–5.1)
Sodium: 136 mmol/L (ref 135–145)

## 2016-01-24 MED ORDER — ALBUTEROL SULFATE (2.5 MG/3ML) 0.083% IN NEBU
2.5000 mg | INHALATION_SOLUTION | Freq: Once | RESPIRATORY_TRACT | Status: AC
  Administered 2016-01-24: 2.5 mg via RESPIRATORY_TRACT
  Filled 2016-01-24: qty 3

## 2016-01-24 MED ORDER — PREDNISONE 10 MG PO TABS: 40.0000 mg | ORAL_TABLET | Freq: Every day | ORAL | Status: DC

## 2016-01-24 NOTE — ED Notes (Signed)
Patient c/o shortness of breath that started this morning. Reports of productive cough. Denies fever. Per EMS patient had 2 albuterol and 1 duoneb treatments. Also had '125mg'$  of Solumedrol IVP. Denies pain. NAD noted.

## 2016-01-24 NOTE — ED Notes (Signed)
MD at bedside. 

## 2016-01-24 NOTE — ED Provider Notes (Signed)
CSN: [id number]     Arrival date & time 01/24/16  1334 History   First MD Initiated Contact with Patient 01/24/16 1409     Chief Complaint  Patient presents with  . Shortness of Breath     (Consider location/radiation/quality/duration/timing/severity/associated sxs/prior Treatment) Patient is a 82 y.o. female presenting with shortness of breath. The history is provided by the patient.  Shortness of Breath Associated symptoms: wheezing   Associated symptoms: no abdominal pain, no chest pain, no fever, no headaches and no rash    patient brought in by EMS for shortness of breath. The patients felt like she's been wheezing reports a productive cough but no fevers does not feel like she has an upper respiratory infection. Patient was given before arrival to albuterol and one DuoNeb treatment and then also of Solu-Medrol.  Patient arrived feeling better she is normally on 3 L of oxygen at all times. Oxygen saturations were fine in route. Patient has albuterol at home she is currently not on prednisone. Patient does not feel like she has the flu or an upper respiratory infection.  Past Medical History  Diagnosis Date  . Essential hypertension   . Arthritis   . Diverticulitis   . Type II diabetes mellitus ([place])   . H/O ventral hernia   . COPD (chronic obstructive pulmonary disease) ([place])   . Osteoporosis   . Adenocarcinoma of lung ([place])     Left lung 2009, resected  . Non-obstructive CAD     a. 04/2015 NSTEMI/Cath: LAD 10p, LCX 30m RCA 323m20d, EF 35-40 w/ apical ballooning.  . Takotsubo cardiomyopathy     a. 04/2015 Echo: EF 45-50%, mid-dist anterior/apical/inferoapical HK w/ hyperdynamic base. Gr 1 DD, mild AI, mild-mod MR, triv TR, PASP 4848m;  b. 04/2015 LV gram: Ef 35-40% w/ apical ballooning.  . DMarland Kitchenslipidemia   . Ventricular bigeminy     a. 04/2015 in setting of NSTEMI/Takotsubo.  . CHF (congestive heart failure) ([place] . Osteoporosis 11/04/2015    Managed by Dr. FanLegrand Rams Past  Surgical History  Procedure Laterality Date  . Cholecystectomy    . Ectopic pregnancy surgery    . Abdominal hysterectomy    . Lung cancer surgery    . Incisional hernia repair N/A 08/26/2013    Procedure: HERNIA REPAIR INCISIONAL WITH MESH;  Surgeon: MarJamesetta SoD;  Location: [facility] ORS;  Service: General;  Laterality: N/A;  . Colonoscopy N/A 09/18/2014    Procedure: COLONOSCOPY;  Surgeon: SanDanie BinderD;  Location: [facility] ENDO SUITE;  Service: Endoscopy;  Laterality: N/A;  8:30 AM - moved to 10:30 - CRosendo Gros notify pt  . Cardiac catheterization N/A 04/17/2015    Procedure: Left Heart Cath and Coronary Angiography;  Surgeon: ThoTroy SineD;  Location: [facility] LAB;  Service: Cardiovascular;  Laterality: N/A;   Family History  Problem Relation Age of Onset  . Diabetes Mother   . Hypertension Mother   . Diabetes Father   . Asthma    . Cancer    . Colon cancer Neg Hx    Social History  Substance Use Topics  . Smoking status: Former Smoker -- 20 years    Types: Cigarettes    Quit date: 08/13/2006  . Smokeless tobacco: Never Used  . Alcohol Use: No   OB History    No data available     Review of Systems  Constitutional: Negative for fever.  HENT: Negative for congestion.  Eyes: Negative for redness and visual disturbance.  Respiratory: Positive for shortness of breath and wheezing.   Cardiovascular: Negative for chest pain.  Gastrointestinal: Negative for abdominal pain.  Genitourinary: Negative for dysuria.  Musculoskeletal: Negative for back pain.  Skin: Negative for rash.  Neurological: Negative for headaches.  Psychiatric/Behavioral: Negative for confusion.      Allergies  Lisinopril  Home Medications   Prior to Admission medications   Medication Sig Start Date End Date Taking? Authorizing Provider  albuterol (PROAIR HFA) 108 (90 BASE) MCG/ACT inhaler Inhale 2 puffs into the lungs every 4 (four) hours as needed for wheezing or shortness of breath.    Yes Historical Provider, MD  alendronate (FOSAMAX) 70 MG tablet Take 70 mg by mouth once a week. On Fridays 03/14/15  Yes Historical Provider, MD  ALPRAZolam Duanne Moron) 0.25 MG tablet Take 0.25 mg by mouth 2 (two) times daily. 04/30/15  Yes Historical Provider, MD  aspirin EC 81 MG tablet Take 81 mg by mouth daily.   Yes Historical Provider, MD  atorvastatin (LIPITOR) 80 MG tablet Take 1 tablet by mouth every morning. 06/30/15  Yes Historical Provider, MD  benzonatate (TESSALON) 100 MG capsule Take 1 capsule (100 mg total) by mouth every 8 (eight) hours. 12/21/15  Yes Noemi Chapel, MD  buPROPion [facility] SR) 150 MG 12 hr tablet Take 150 mg by mouth 2 (two) times daily.   Yes Historical Provider, MD  Cholecalciferol (VITAMIN D-3) 1000 UNITS CAPS Take 1 capsule by mouth daily.   Yes Historical Provider, MD  fish oil-omega-3 fatty acids 1000 MG capsule Take 1 g by mouth 2 (two) times daily.    Yes Historical Provider, MD  furosemide (LASIX) 40 MG tablet Take 1 tablet (40 mg total) by mouth daily. 08/02/15  Yes Rosita Fire, MD  gabapentin (NEURONTIN) 300 MG capsule Take 300 mg by mouth daily.    Yes Historical Provider, MD  Garlic 510 MG TABS Take 300 mg by mouth daily.   Yes Historical Provider, MD  ipratropium-albuterol (DUONEB) 0.5-2.5 (3) MG/3ML SOLN Inhale 3 mLs into the lungs every 4 (four) hours as needed (wheezing/shortness of breath).  09/05/14  Yes Historical Provider, MD  linagliptin (TRADJENTA) 5 MG TABS tablet Take 5 mg by mouth daily.   Yes Historical Provider, MD  losartan (COZAAR) 50 MG tablet Take 50 mg by mouth daily. 06/29/15  Yes Historical Provider, MD  meclizine (ANTIVERT) 25 MG tablet Take 1 tablet by mouth 3 (three) times daily as needed for dizziness.  09/05/14  Yes Historical Provider, MD  metoprolol succinate (TOPROL-XL) 25 MG 24 hr tablet Take 1 tablet (25 mg total) by mouth daily. 06/26/15  Yes Jettie Booze, MD  Naproxen Sodium (ALEVE PO) Take 1 tablet by mouth as needed.    Yes Historical Provider, MD  omeprazole (PRILOSEC) 20 MG capsule Take 20 mg by mouth daily.   Yes Historical Provider, MD  pyridoxine (B-6) 100 MG tablet Take 100 mg by mouth daily.   Yes Historical Provider, MD  rOPINIRole (REQUIP) 0.25 MG tablet Take 0.25 mg by mouth 3 (three) times daily.  09/05/14  Yes Historical Provider, MD  sodium chloride (OCEAN) 0.65 % SOLN nasal spray Place 1 spray into both nostrils as needed for congestion.   Yes Historical Provider, MD  azithromycin (ZITHROMAX Z-PAK) 250 MG tablet Take 1 tablet (250 mg total) by mouth daily. '500mg'$  PO day 1, then '250mg'$  PO days 205 Patient not taking: Reported on 12/25/2015 12/21/15   Noemi Chapel,  MD  nitroGLYCERIN (NITROSTAT) 0.4 MG SL tablet Place 0.4 mg under the tongue every 5 (five) minutes as needed for chest pain.    Historical Provider, MD  predniSONE (DELTASONE) 10 MG tablet Take 4 tablets (40 mg total) by mouth daily. 01/24/16   Fredia Sorrow, MD   BP 117/68 mmHg  Pulse 94  Resp 16  SpO2 97% Physical Exam  Constitutional: She is oriented to person, place, and time. She appears well-developed and well-nourished. No distress.  HENT:  Head: Normocephalic and atraumatic.  Mouth/Throat: Oropharynx is clear and moist.  Eyes: Conjunctivae and EOM are normal. Pupils are equal, round, and reactive to light.  Neck: Normal range of motion. Neck supple.  Cardiovascular: Normal rate and regular rhythm.   No murmur heard. Pulmonary/Chest: Effort normal. No respiratory distress. She has wheezes.  Abdominal: Soft. Bowel sounds are normal. There is no tenderness.  Musculoskeletal: Normal range of motion.  Neurological: She is alert and oriented to person, place, and time. No cranial nerve deficit. She exhibits normal muscle tone. Coordination normal.  Skin: Skin is warm.  Nursing note and vitals reviewed.   ED Course  Procedures (including critical care time) Labs Review Labs Reviewed  BASIC METABOLIC PANEL - Abnormal; Notable  for the following:    Glucose, Bld 180 (*)    BUN 21 (*)    Creatinine, Ser 1.46 (*)    GFR calc non Af Amer 33 (*)    GFR calc Af Amer 38 (*)    All other components within normal limits  CBC WITH DIFFERENTIAL/PLATELET   Results for orders placed or performed during the hospital encounter of 36/64/40  Basic metabolic panel  Result Value Ref Range   Sodium 136 135 - 145 mmol/L   Potassium 4.3 3.5 - 5.1 mmol/L   Chloride 101 101 - 111 mmol/L   CO2 30 22 - 32 mmol/L   Glucose, Bld 180 (H) 65 - 99 mg/dL   BUN 21 (H) 6 - 20 mg/dL   Creatinine, Ser 1.46 (H) 0.44 - 1.00 mg/dL   Calcium 9.1 8.9 - 10.3 mg/dL   GFR calc non Af Amer 33 (L) >60 mL/min   GFR calc Af Amer 38 (L) >60 mL/min   Anion gap 5 5 - 15  CBC with Differential/Platelet  Result Value Ref Range   WBC 7.0 4.0 - 10.5 K/uL   RBC 4.16 3.87 - 5.11 MIL/uL   Hemoglobin 12.7 12.0 - 15.0 g/dL   HCT 40.0 36.0 - 46.0 %   MCV 96.2 78.0 - 100.0 fL   MCH 30.5 26.0 - 34.0 pg   MCHC 31.8 30.0 - 36.0 g/dL   RDW 14.6 11.5 - 15.5 %   Platelets 173 150 - 400 K/uL   Neutrophils Relative % 72 %   Neutro Abs 5.0 1.7 - 7.7 K/uL   Lymphocytes Relative 18 %   Lymphs Abs 1.3 0.7 - 4.0 K/uL   Monocytes Relative 6 %   Monocytes Absolute 0.4 0.1 - 1.0 K/uL   Eosinophils Relative 4 %   Eosinophils Absolute 0.3 0.0 - 0.7 K/uL   Basophils Relative 0 %   Basophils Absolute 0.0 0.0 - 0.1 K/uL     Imaging Review Dg Chest 2 View  01/24/2016  CLINICAL DATA:  Productive cough, shortness of breath. EXAM: CHEST  2 VIEW COMPARISON:  December 21, 2015. FINDINGS: Stable cardiomediastinal silhouette. No pneumothorax or pleural effusion is noted. Anterior osteophyte formation is noted in lower thoracic spine. No acute  pulmonary disease is noted. Deformity of left lower ribs is noted consistent with old fractures. IMPRESSION: No active cardiopulmonary disease. Electronically Signed   By: Marijo Conception, M.D.   On: 01/24/2016 15:06   I have personally  reviewed and evaluated these images and lab results as part of my medical decision-making.   EKG Interpretation   Date/Time:  Thursday January 24 2016 13:48:49 EST Ventricular Rate:  93 PR Interval:  143 QRS Duration: 87 QT Interval:  382 QTC Calculation: 475 R Axis:   65 Text Interpretation:  Sinus rhythm Multiple ventricular premature  complexes Anteroseptal infarct, age indeterminate ST elevation, consider  inferior injury Confirmed by Elwyn Lowden  MD, Jayon Mat[place] [phone]) on 01/24/2016  3:56:38 PM      MDM   Final diagnoses:  Chronic obstructive pulmonary disease with acute exacerbation [facility])   Patient presenting with exacerbation of his COPD. Patient normally on 3 L of oxygen at all times for sats are normal on that. Patient with intermittent wheezing wheezing resolved initially then reoccurred patient given third albuterol inhaler and raising is resolved. EMS gave her SoluMedrol in route.  Patient now feeling much better no further wheezing will continue with a 5 day course of prednisone and continue have her use albuterol inhaler 4 times a day at home.    Fredia Sorrow, MD 01/24/16 1806

## 2016-01-24 NOTE — ED Notes (Signed)
REturned from xray.

## 2016-01-24 NOTE — Discharge Instructions (Signed)
Usual albuterol inhaler 4 times a day for the next 7 days. Take prednisone as directed for the next 5 days. Return for any new or worse symptoms. Today's chest x-ray was negative.

## 2016-01-26 DIAGNOSIS — J449 Chronic obstructive pulmonary disease, unspecified: Secondary | ICD-10-CM | POA: Diagnosis not present

## 2016-02-06 DIAGNOSIS — B351 Tinea unguium: Secondary | ICD-10-CM | POA: Diagnosis not present

## 2016-02-06 DIAGNOSIS — E1151 Type 2 diabetes mellitus with diabetic peripheral angiopathy without gangrene: Secondary | ICD-10-CM | POA: Diagnosis not present

## 2016-02-06 DIAGNOSIS — E1140 Type 2 diabetes mellitus with diabetic neuropathy, unspecified: Secondary | ICD-10-CM | POA: Diagnosis not present

## 2016-02-06 DIAGNOSIS — L110 Acquired keratosis follicularis: Secondary | ICD-10-CM | POA: Diagnosis not present

## 2016-02-14 ENCOUNTER — Other Ambulatory Visit: Payer: Self-pay

## 2016-02-14 NOTE — Patient Outreach (Signed)
[facility]) Care Management  [place]  02/14/2016   Angela Lawson Apr 12, 1934 [id number]  Subjective: Telephone call to patient for monthly call.  Patient reports she went to the emergency room for shortness of breath. Patient reports that she was given prednisone and breathing treatment.  Discussed with patient signs and symptoms of COPD exacerbation and when to get help.  Also reiterated with patient the 24 hour nurse line with [place] [facility].  She verbalized understanding and has her magnet for the 24 hour line.  Patient reports that her blood sugars are in the 130's and her weight last check 180 lbs.  No concerns.    Objective:   Current Medications:  Current Outpatient Prescriptions  Medication Sig Dispense Refill  . albuterol (PROAIR HFA) 108 (90 BASE) MCG/ACT inhaler Inhale 2 puffs into the lungs every 4 (four) hours as needed for wheezing or shortness of breath.    Marland Kitchen alendronate (FOSAMAX) 70 MG tablet Take 70 mg by mouth once a week. On Fridays    . ALPRAZolam (XANAX) 0.25 MG tablet Take 0.25 mg by mouth 2 (two) times daily.  2  . aspirin EC 81 MG tablet Take 81 mg by mouth daily.    Marland Kitchen atorvastatin (LIPITOR) 80 MG tablet Take 1 tablet by mouth every morning.    . benzonatate (TESSALON) 100 MG capsule Take 1 capsule (100 mg total) by mouth every 8 (eight) hours. 21 capsule 0  . buPROPion (WELLBUTRIN SR) 150 MG 12 hr tablet Take 150 mg by mouth 2 (two) times daily.    . Cholecalciferol (VITAMIN D-3) 1000 UNITS CAPS Take 1 capsule by mouth daily.    . fish oil-omega-3 fatty acids 1000 MG capsule Take 1 g by mouth 2 (two) times daily.     . furosemide (LASIX) 40 MG tablet Take 1 tablet (40 mg total) by mouth daily. 30 tablet 3  . gabapentin (NEURONTIN) 300 MG capsule Take 300 mg by mouth daily.     . Garlic 329 MG TABS Take 300 mg by mouth daily.    Marland Kitchen ipratropium-albuterol (DUONEB) 0.5-2.5 (3) MG/3ML SOLN Inhale 3 mLs into the lungs every 4 (four) hours as  needed (wheezing/shortness of breath).     . linagliptin (TRADJENTA) 5 MG TABS tablet Take 5 mg by mouth daily.    Marland Kitchen losartan (COZAAR) 50 MG tablet Take 50 mg by mouth daily.  2  . meclizine (ANTIVERT) 25 MG tablet Take 1 tablet by mouth 3 (three) times daily as needed for dizziness.     . metoprolol succinate (TOPROL-XL) 25 MG 24 hr tablet Take 1 tablet (25 mg total) by mouth daily. 90 tablet 3  . Naproxen Sodium (ALEVE PO) Take 1 tablet by mouth as needed.    . nitroGLYCERIN (NITROSTAT) 0.4 MG SL tablet Place 0.4 mg under the tongue every 5 (five) minutes as needed for chest pain.    Marland Kitchen omeprazole (PRILOSEC) 20 MG capsule Take 20 mg by mouth daily.    . predniSONE (DELTASONE) 10 MG tablet Take 4 tablets (40 mg total) by mouth daily. 20 tablet 0  . pyridoxine (B-6) 100 MG tablet Take 100 mg by mouth daily.    Marland Kitchen rOPINIRole (REQUIP) 0.25 MG tablet Take 0.25 mg by mouth 3 (three) times daily.     . sodium chloride (OCEAN) 0.65 % SOLN nasal spray Place 1 spray into both nostrils as needed for congestion.    Marland Kitchen azithromycin (ZITHROMAX Z-PAK) 250 MG tablet Take  1 tablet (250 mg total) by mouth daily. '500mg'$  PO day 1, then '250mg'$  PO days 205 (Patient not taking: Reported on 12/25/2015) 6 tablet 0   No current facility-administered medications for this visit.    Functional Status:  In your present state of health, do you have any difficulty performing the following activities: 12/21/2015 11/23/2015  Hearing? [name]  Vision? Y Y  Difficulty concentrating or making decisions? N N  Walking or climbing stairs? Y Y  Dressing or bathing? N N  Doing errands, shopping? N N  Preparing Food and eating ? N N  Using the Toilet? N N  In the past six months, have you accidently leaked urine? N N  Do you have problems with loss of bowel control? N -  Managing your Medications? N N  Managing your Finances? N -  Housekeeping or managing your Housekeeping? [name]    Fall/Depression Screening: PHQ 2/9 Scores 02/14/2016  01/22/2016 12/25/2015 12/21/2015 11/26/2015 11/23/2015 11/06/2015  PHQ - 2 Score 0 0 0 1 0 0 0  PHQ- 9 Score - - - 4 - 3 -    Assessment: Patient continues to benefit from health coach outreach for disease management and support.  Plan:  [facility] CM Care Plan Problem One        Most Recent Value   Care Plan Problem One  Knowledge deficit COPD   Role Documenting the Problem One  Health Coach   Care Plan for Problem One  Active   THN Long Term Goal (31-90 days)  Patient will be able to explain COPD zones on action plan within the next 90 days    THN Long Term Goal Start Date  02/14/16 [goal continued]   Interventions for Problem One Long Term Goal  RN Health Coach reviewed yellow zone of COPD zone chart.     THN CM Short Term Goal #2 (0-30 days)  Patient will be able to describe techniques of controlling breath during episodes of shortness breath within 30 days.    THN CM Short Term Goal #2 Start Date  02/14/16 [goal continued]   Interventions for Short Term Goal #2  RN Health reviewed with patient pursed lip breathing and coughing up phlegm.    THN CM Short Term Goal #3 (0-30 days)  Patient will be able to name at least 2 symptoms of the COPD action plan yellow zone within 30 days.     THN CM Short Term Goal #3 Start Date  02/14/16   Interventions for Short Tern Goal #3  [place] discussed yellow zone of COPD action plan.      RN Health Coach will contact patient within one month and patient agrees to next outreach.    [name], RN, MSN [place] [phone]

## 2016-02-23 DIAGNOSIS — J449 Chronic obstructive pulmonary disease, unspecified: Secondary | ICD-10-CM | POA: Diagnosis not present

## 2016-03-13 ENCOUNTER — Other Ambulatory Visit: Payer: Self-pay

## 2016-03-13 NOTE — Patient Outreach (Signed)
Angela Lawson  [facility]) Care Management  [place]  03/13/2016   Angela Lawson 1934/09/29 [id number]  Subjective: Telephone call to patient for monthly outreach.  Patient reports she is doing ok.  She denies ER visit or hospitalization since last conversation. Patient reports that her breathing is about the same. Discussed with patient COPD zone chart and when to notify physician.  She verbalized understanding. No concerns.    Objective:   Encounter Medications:  Outpatient Encounter Prescriptions as of 03/13/2016  Medication Sig Note  . albuterol (PROAIR HFA) 108 (90 BASE) MCG/ACT inhaler Inhale 2 puffs into the lungs every 4 (four) hours as needed for wheezing or shortness of breath.   Marland Kitchen alendronate (FOSAMAX) 70 MG tablet Take 70 mg by mouth once a week. On Fridays   . ALPRAZolam (XANAX) 0.25 MG tablet Take 0.25 mg by mouth 2 (two) times daily.   Marland Kitchen aspirin EC 81 MG tablet Take 81 mg by mouth daily.   Marland Kitchen atorvastatin (LIPITOR) 80 MG tablet Take 1 tablet by mouth every morning. 08/01/2015: Received from: External Pharmacy Received Sig:   . benzonatate (TESSALON) 100 MG capsule Take 1 capsule (100 mg total) by mouth every 8 (eight) hours.   Marland Kitchen buPROPion (WELLBUTRIN SR) 150 MG 12 hr tablet Take 150 mg by mouth 2 (two) times daily.   . Cholecalciferol (VITAMIN D-3) 1000 UNITS CAPS Take 1 capsule by mouth daily.   . fish oil-omega-3 fatty acids 1000 MG capsule Take 1 g by mouth 2 (two) times daily.    . furosemide (LASIX) 40 MG tablet Take 1 tablet (40 mg total) by mouth daily.   Marland Kitchen gabapentin (NEURONTIN) 300 MG capsule Take 300 mg by mouth daily.    . Garlic 734 MG TABS Take 300 mg by mouth daily.   Marland Kitchen ipratropium-albuterol (DUONEB) 0.5-2.5 (3) MG/3ML SOLN Inhale 3 mLs into the lungs every 4 (four) hours as needed (wheezing/shortness of breath).    . linagliptin (TRADJENTA) 5 MG TABS tablet Take 5 mg by mouth daily.   Marland Kitchen losartan (COZAAR) 50 MG tablet Take 50 mg by mouth daily.  07/03/2015: Received from: External Pharmacy  . meclizine (ANTIVERT) 25 MG tablet Take 1 tablet by mouth 3 (three) times daily as needed for dizziness.    . metoprolol succinate (TOPROL-XL) 25 MG 24 hr tablet Take 1 tablet (25 mg total) by mouth daily.   . Naproxen Sodium (ALEVE PO) Take 1 tablet by mouth as needed.   . nitroGLYCERIN (NITROSTAT) 0.4 MG SL tablet Place 0.4 mg under the tongue every 5 (five) minutes as needed for chest pain.   Marland Kitchen omeprazole (PRILOSEC) 20 MG capsule Take 20 mg by mouth daily.   . predniSONE (DELTASONE) 10 MG tablet Take 4 tablets (40 mg total) by mouth daily.   Marland Kitchen pyridoxine (B-6) 100 MG tablet Take 100 mg by mouth daily.   Marland Kitchen rOPINIRole (REQUIP) 0.25 MG tablet Take 0.25 mg by mouth 3 (three) times daily.    . sodium chloride (OCEAN) 0.65 % SOLN nasal spray Place 1 spray into both nostrils as needed for congestion.   Marland Kitchen azithromycin (ZITHROMAX Z-PAK) 250 MG tablet Take 1 tablet (250 mg total) by mouth daily. '500mg'$  PO day 1, then '250mg'$  PO days 205 (Patient not taking: Reported on 12/25/2015)    No facility-administered encounter medications on file as of 03/13/2016.    Functional Status:  In your present state of health, do you have any difficulty performing the following activities: 12/21/2015 11/23/2015  Hearing? Angela Lawson  Vision? Y Y  Difficulty concentrating or making decisions? N N  Walking or climbing stairs? Y Y  Dressing or bathing? N N  Doing errands, shopping? N N  Preparing Food and eating ? N N  Using the Toilet? N N  In the past six months, have you accidently leaked urine? N N  Do you have problems with loss of bowel control? N -  Managing your Medications? N N  Managing your Finances? N -  Housekeeping or managing your Housekeeping? Angela Lawson    Fall/Depression Screening: PHQ 2/9 Scores 03/13/2016 02/14/2016 01/22/2016 12/25/2015 12/21/2015 11/26/2015 11/23/2015  PHQ - 2 Score 0 0 0 0 1 0 0  PHQ- 9 Score - - - - 4 - 3    Assessment: RN Health Coach will contact  patient within one month and patient agrees to next outreach.    Plan:  [facility] CM Care Plan Problem One        Most Recent Value   Care Plan Problem One  Knowledge deficit COPD   Role Documenting the Problem One  Health Coach   Care Plan for Problem One  Active   THN Long Term Goal (31-90 days)  Patient will be able to explain COPD zones on action plan within the next 90 days    THN Long Term Goal Start Date  02/14/16 [goal continued]   Interventions for Problem One Long Term Goal  RN Health Coach reinforced yellow zone of COPD zone chart.     THN CM Short Term Goal #2 (0-30 days)  Patient will be able to describe techniques of controlling breath during episodes of shortness breath within 30 days.    THN CM Short Term Goal #2 Start Date  03/13/16 [goal continued]   Interventions for Short Term Goal #2  RN Health reinforced with patient pursed lip breathing and coughing up phlegm.    THN CM Short Term Goal #3 (0-30 days)  Patient will be able to name at least 2 symptoms of the COPD action plan yellow zone within 30 days.     THN CM Short Term Goal #3 Start Date  03/13/16 Barrie Folk continued]   Interventions for Short Tern Goal #3  RN Health Coach reinforced yellow zone of COPD action plan.      RN Health Coach will contact patient within one month and patient agrees to next outreach.    Angela Baseman, RN, MSN [place] [phone]

## 2016-03-25 DIAGNOSIS — J449 Chronic obstructive pulmonary disease, unspecified: Secondary | ICD-10-CM | POA: Diagnosis not present

## 2016-04-03 DIAGNOSIS — M810 Age-related osteoporosis without current pathological fracture: Secondary | ICD-10-CM | POA: Diagnosis not present

## 2016-04-03 DIAGNOSIS — J449 Chronic obstructive pulmonary disease, unspecified: Secondary | ICD-10-CM | POA: Diagnosis not present

## 2016-04-03 DIAGNOSIS — N183 Chronic kidney disease, stage 3 (moderate): Secondary | ICD-10-CM | POA: Diagnosis not present

## 2016-04-03 DIAGNOSIS — I10 Essential (primary) hypertension: Secondary | ICD-10-CM | POA: Diagnosis not present

## 2016-04-03 DIAGNOSIS — N390 Urinary tract infection, site not specified: Secondary | ICD-10-CM | POA: Diagnosis not present

## 2016-04-03 DIAGNOSIS — E119 Type 2 diabetes mellitus without complications: Secondary | ICD-10-CM | POA: Diagnosis not present

## 2016-04-08 ENCOUNTER — Other Ambulatory Visit: Payer: Self-pay

## 2016-04-08 NOTE — Patient Outreach (Signed)
Angela Lawson) Care Management  [place]  04/08/2016   Angela Lawson [birth year] [id number]  Subjective: Telephone call to patient for monthly call.  Patient reports she is doing good.  She reports that her breathing is about the same. No emergency room visits.  Discussed with patient yellow and red zones of COPD zone chart.  She verbalized understanding.  No concerns.    Objective:   Encounter Medications:  Outpatient Encounter Prescriptions as of 04/08/2016  Medication Sig Note  . albuterol (PROAIR HFA) 108 (90 BASE) MCG/ACT inhaler Inhale 2 puffs into the lungs every 4 (four) hours as needed for wheezing or shortness of breath.   Marland Kitchen alendronate (FOSAMAX) 70 MG tablet Take 70 mg by mouth once a week. On Fridays   . ALPRAZolam (XANAX) 0.25 MG tablet Take 0.25 mg by mouth 2 (two) times daily.   Marland Kitchen aspirin EC 81 MG tablet Take 81 mg by mouth daily.   Marland Kitchen atorvastatin (LIPITOR) 80 MG tablet Take 1 tablet by mouth every morning. 08/01/2015: Received from: External Pharmacy Received Sig:   . benzonatate (TESSALON) 100 MG capsule Take 1 capsule (100 mg total) by mouth every 8 (eight) hours.   Marland Kitchen buPROPion (WELLBUTRIN SR) 150 MG 12 hr tablet Take 150 mg by mouth 2 (two) times daily.   . Cholecalciferol (VITAMIN D-3) 1000 UNITS CAPS Take 1 capsule by mouth daily.   . fish oil-omega-3 fatty acids 1000 MG capsule Take 1 g by mouth 2 (two) times daily.    . furosemide (LASIX) 40 MG tablet Take 1 tablet (40 mg total) by mouth daily.   Marland Kitchen gabapentin (NEURONTIN) 300 MG capsule Take 300 mg by mouth daily.    . Garlic 355 MG TABS Take 300 mg by mouth daily.   Marland Kitchen ipratropium-albuterol (DUONEB) 0.5-2.5 (3) MG/3ML SOLN Inhale 3 mLs into the lungs every 4 (four) hours as needed (wheezing/shortness of breath).    . linagliptin (TRADJENTA) 5 MG TABS tablet Take 5 mg by mouth daily.   Marland Kitchen losartan (COZAAR) 50 MG tablet Take 50 mg by mouth daily. 07/03/2015: Received from: External Pharmacy  .  meclizine (ANTIVERT) 25 MG tablet Take 1 tablet by mouth 3 (three) times daily as needed for dizziness.    . metoprolol succinate (TOPROL-XL) 25 MG 24 hr tablet Take 1 tablet (25 mg total) by mouth daily.   . Naproxen Sodium (ALEVE PO) Take 1 tablet by mouth as needed.   . nitroGLYCERIN (NITROSTAT) 0.4 MG SL tablet Place 0.4 mg under the tongue every 5 (five) minutes as needed for chest pain.   Marland Kitchen omeprazole (PRILOSEC) 20 MG capsule Take 20 mg by mouth daily.   . predniSONE (DELTASONE) 10 MG tablet Take 4 tablets (40 mg total) by mouth daily.   Marland Kitchen pyridoxine (B-6) 100 MG tablet Take 100 mg by mouth daily.   Marland Kitchen rOPINIRole (REQUIP) 0.25 MG tablet Take 0.25 mg by mouth 3 (three) times daily.    . sodium chloride (OCEAN) 0.65 % SOLN nasal spray Place 1 spray into both nostrils as needed for congestion.   Marland Kitchen azithromycin (ZITHROMAX Z-PAK) 250 MG tablet Take 1 tablet (250 mg total) by mouth daily. 546m PO day 1, then 258mPO days 205 (Patient not taking: Reported on 12/25/2015)    No facility-administered encounter medications on file as of 04/08/2016.    Functional Status:  In your present state of health, do you have any difficulty performing the following activities: 12/21/2015 11/23/2015  Hearing? YDarreld Lawson  Y  Vision? Y Y  Difficulty concentrating or making decisions? N N  Walking or climbing stairs? Y Y  Dressing or bathing? N N  Doing errands, shopping? N N  Preparing Food and eating ? N N  Using the Toilet? N N  In the past six months, have you accidently leaked urine? N N  Do you have problems with loss of bowel control? N -  Managing your Medications? N N  Managing your Finances? N -  Housekeeping or managing your Housekeeping? Angela Lawson    Fall/Depression Screening: PHQ 2/9 Scores 04/08/2016 03/13/2016 02/14/2016 01/22/2016 12/25/2015 12/21/2015 11/26/2015  PHQ - 2 Score 0 0 0 0 0 1 0  PHQ- 9 Score - - - - - 4 -    Assessment: Patient continues to benefit from health coach outreach for disease management  and support.    Plan:  [facility] CM Care Plan Problem One        Most Recent Value   Care Plan Problem One  Knowledge deficit COPD   Role Documenting the Problem One  Health Coach   Care Plan for Problem One  Active   THN Long Term Goal (31-90 days)  Patient will be able to explain COPD zones on action plan within the next 90 days    THN Long Term Goal Start Date  02/14/16 [goal continued]   Interventions for Problem One Long Term Goal  RN Health Coach reviewed yellow and red zone of COPD zone chart.     THN CM Short Term Goal #1 (0-30 days)  Patient will be able to name at least two symptoms of red zone of COPD chart within 30 days.    THN CM Short Term Goal #1 Start Date  04/08/16   Interventions for Short Term Goal #1  RN Health Coach reviewed red zone of COPD action plan.    THN CM Short Term Goal #2 (0-30 days)  Patient will be able to describe techniques of controlling breath during episodes of shortness breath within 30 days.    THN CM Short Term Goal #2 Start Date  03/13/16 [goal continued]   [facility] CM Short Term Goal #2 Met Date  04/08/16   Interventions for Short Term Goal #2  Patient able to describe pursed lip breathing technique.   THN CM Short Term Goal #3 (0-30 days)  Patient will be able to name at least 2 symptoms of the COPD action plan yellow zone within 30 days.     THN CM Short Term Goal #3 Start Date  03/13/16 [goal continued]   Interventions for Short Tern Goal #3  RN Health Coach reviewed yellow zone of COPD action plan.      RN Health Coach will contact patient within one month and patient agrees to next outreach.    Angela Baseman, RN, MSN [place] [phone]

## 2016-04-24 ENCOUNTER — Inpatient Hospital Stay (HOSPITAL_COMMUNITY)
Admission: EM | Admit: 2016-04-24 | Discharge: 2016-04-28 | DRG: 190 | Disposition: A | Discharge status: Home Health | Payer: Medicare Other | Attending: Internal Medicine | Admitting: Internal Medicine

## 2016-04-24 ENCOUNTER — Encounter (HOSPITAL_COMMUNITY): Payer: Self-pay

## 2016-04-24 ENCOUNTER — Emergency Department (HOSPITAL_COMMUNITY): Payer: Medicare Other

## 2016-04-24 DIAGNOSIS — I509 Heart failure, unspecified: Secondary | ICD-10-CM | POA: Diagnosis not present

## 2016-04-24 DIAGNOSIS — J9621 Acute and chronic respiratory failure with hypoxia: Secondary | ICD-10-CM | POA: Diagnosis not present

## 2016-04-24 DIAGNOSIS — I130 Hypertensive heart and chronic kidney disease with heart failure and stage 1 through stage 4 chronic kidney disease, or unspecified chronic kidney disease: Secondary | ICD-10-CM | POA: Diagnosis not present

## 2016-04-24 DIAGNOSIS — E119 Type 2 diabetes mellitus without complications: Secondary | ICD-10-CM

## 2016-04-24 DIAGNOSIS — J449 Chronic obstructive pulmonary disease, unspecified: Secondary | ICD-10-CM | POA: Diagnosis not present

## 2016-04-24 DIAGNOSIS — Z8249 Family history of ischemic heart disease and other diseases of the circulatory system: Secondary | ICD-10-CM

## 2016-04-24 DIAGNOSIS — Z833 Family history of diabetes mellitus: Secondary | ICD-10-CM | POA: Diagnosis not present

## 2016-04-24 DIAGNOSIS — Z79899 Other long term (current) drug therapy: Secondary | ICD-10-CM | POA: Diagnosis not present

## 2016-04-24 DIAGNOSIS — Z87891 Personal history of nicotine dependence: Secondary | ICD-10-CM | POA: Diagnosis not present

## 2016-04-24 DIAGNOSIS — J441 Chronic obstructive pulmonary disease with (acute) exacerbation: Secondary | ICD-10-CM | POA: Diagnosis not present

## 2016-04-24 DIAGNOSIS — R0602 Shortness of breath: Secondary | ICD-10-CM

## 2016-04-24 DIAGNOSIS — N183 Chronic kidney disease, stage 3 unspecified: Secondary | ICD-10-CM | POA: Diagnosis present

## 2016-04-24 DIAGNOSIS — Z9981 Dependence on supplemental oxygen: Secondary | ICD-10-CM | POA: Diagnosis not present

## 2016-04-24 DIAGNOSIS — C3490 Malignant neoplasm of unspecified part of unspecified bronchus or lung: Secondary | ICD-10-CM | POA: Diagnosis present

## 2016-04-24 DIAGNOSIS — Z7982 Long term (current) use of aspirin: Secondary | ICD-10-CM | POA: Diagnosis not present

## 2016-04-24 DIAGNOSIS — M810 Age-related osteoporosis without current pathological fracture: Secondary | ICD-10-CM | POA: Diagnosis not present

## 2016-04-24 DIAGNOSIS — E1122 Type 2 diabetes mellitus with diabetic chronic kidney disease: Secondary | ICD-10-CM | POA: Diagnosis present

## 2016-04-24 DIAGNOSIS — Z85118 Personal history of other malignant neoplasm of bronchus and lung: Secondary | ICD-10-CM | POA: Diagnosis not present

## 2016-04-24 DIAGNOSIS — I5032 Chronic diastolic (congestive) heart failure: Secondary | ICD-10-CM

## 2016-04-24 DIAGNOSIS — I10 Essential (primary) hypertension: Secondary | ICD-10-CM | POA: Diagnosis present

## 2016-04-24 DIAGNOSIS — T17908A Unspecified foreign body in respiratory tract, part unspecified causing other injury, initial encounter: Secondary | ICD-10-CM

## 2016-04-24 DIAGNOSIS — Z825 Family history of asthma and other chronic lower respiratory diseases: Secondary | ICD-10-CM | POA: Diagnosis not present

## 2016-04-24 HISTORY — DX: Dependence on supplemental oxygen: Z99.81

## 2016-04-24 LAB — TROPONIN I: Troponin I: <0.03 ng/mL (ref <0.031)

## 2016-04-24 LAB — CBC WITH DIFFERENTIAL/PLATELET
Basophils Absolute: 0 10*3/uL (ref 0.0–0.1)
Basophils Relative: 0 %
Eosinophils Absolute: 0.6 10*3/uL (ref 0.0–0.7)
Eosinophils Relative: 8 %
HCT: 41.2 % (ref 36.0–46.0)
Hemoglobin: 13 g/dL (ref 12.0–15.0)
Lymphocytes Relative: 23 %
Lymphs Abs: 1.8 10*3/uL (ref 0.7–4.0)
MCH: 30.5 pg (ref 26.0–34.0)
MCHC: 31.6 g/dL (ref 30.0–36.0)
MCV: 96.7 fL (ref 78.0–100.0)
Monocytes Absolute: 0.9 10*3/uL (ref 0.1–1.0)
Monocytes Relative: 12 %
Neutro Abs: 4.3 10*3/uL (ref 1.7–7.7)
Neutrophils Relative %: 57 %
Platelets: 181 10*3/uL (ref 150–400)
RBC: 4.26 MIL/uL (ref 3.87–5.11)
RDW: 13.9 % (ref 11.5–15.5)
WBC: 7.6 10*3/uL (ref 4.0–10.5)

## 2016-04-24 LAB — BASIC METABOLIC PANEL
Anion gap: 8 (ref 5–15)
BUN: 22 mg/dL — ABNORMAL HIGH (ref 6–20)
CO2: 29 mmol/L (ref 22–32)
Calcium: 8.7 mg/dL — ABNORMAL LOW (ref 8.9–10.3)
Chloride: 101 mmol/L (ref 101–111)
Creatinine, Ser: 1.39 mg/dL — ABNORMAL HIGH (ref 0.44–1.00)
GFR calc Af Amer: 40 mL/min — ABNORMAL LOW (ref >60)
GFR calc non Af Amer: 35 mL/min — ABNORMAL LOW (ref >60)
Glucose, Bld: 147 mg/dL — ABNORMAL HIGH (ref 65–99)
Potassium: 3.9 mmol/L (ref 3.5–5.1)
Sodium: 138 mmol/L (ref 135–145)

## 2016-04-24 LAB — BRAIN NATRIURETIC PEPTIDE: B Natriuretic Peptide: 20 pg/mL (ref 0.0–100.0)

## 2016-04-24 MED ORDER — IPRATROPIUM BROMIDE 0.02 % IN SOLN
1.0000 mg | Freq: Once | RESPIRATORY_TRACT | Status: AC
  Administered 2016-04-24: 1 mg via RESPIRATORY_TRACT
  Filled 2016-04-24: qty 5

## 2016-04-24 MED ORDER — METHYLPREDNISOLONE SODIUM SUCC 125 MG IJ SOLR
125.0000 mg | Freq: Once | INTRAMUSCULAR | Status: AC
  Administered 2016-04-24: 125 mg via INTRAVENOUS
  Filled 2016-04-24: qty 2

## 2016-04-24 MED ORDER — ALBUTEROL (5 MG/ML) CONTINUOUS INHALATION SOLN
10.0000 mg/h | INHALATION_SOLUTION | Freq: Once | RESPIRATORY_TRACT | Status: AC
  Administered 2016-04-24: 10 mg/h via RESPIRATORY_TRACT
  Filled 2016-04-24: qty 20

## 2016-04-24 NOTE — ED Notes (Signed)
Pt reports sob that has worsened since yesterday, denies cp.  Pt is on home oxygen at 3 lpm

## 2016-04-24 NOTE — ED Provider Notes (Signed)
CSN: [id number]     Arrival date & time 04/24/16  2153 History   First MD Initiated Contact with Patient 04/24/16 2212     Chief Complaint  Patient presents with  . Shortness of Breath      HPI Pt was seen at 2210.  Per pt's family and pt, c/o gradual onset and worsening of persistent cough, wheezing and SOB for the past 2 days. Has been using her home O2, MDI and nebs without relief. Pt's family states pt's O2 Sat "dropped into the 63's" tonight PTA. EMS was called and "put a mask with more oxygen on her and her number went to 97%." Pt refused transport to the ED by EMS.  Denies CP/palpitations, no back pain, no abd pain, no N/V/D, no fevers, no rash.     Past Medical History  Diagnosis Date  . Essential hypertension   . Arthritis   . Diverticulitis   . Type II diabetes mellitus ([place])   . H/O ventral hernia   . COPD (chronic obstructive pulmonary disease) ([place])   . Osteoporosis   . Adenocarcinoma of lung ([place])     Left lung 2009, resected  . Non-obstructive CAD     a. 04/2015 NSTEMI/Cath: LAD 10p, LCX 26m RCA 392m20d, EF 35-40 w/ apical ballooning.  . Takotsubo cardiomyopathy     a. 04/2015 Echo: EF 45-50%, mid-dist anterior/apical/inferoapical HK w/ hyperdynamic base. Gr 1 DD, mild AI, mild-mod MR, triv TR, PASP 4872m;  b. 04/2015 LV gram: Ef 35-40% w/ apical ballooning.  . DMarland Kitchenslipidemia   . Ventricular bigeminy     a. 04/2015 in setting of NSTEMI/Takotsubo.  . CHF (congestive heart failure) [place] Osteoporosis 11/04/2015    Managed by Dr. FanLegrand Rams. On home O2     3L N/C   Past Surgical History  Procedure Laterality Date  . Cholecystectomy    . Ectopic pregnancy surgery    . Abdominal hysterectomy    . Lung cancer surgery    . Incisional hernia repair N/A 08/26/2013    Procedure: HERNIA REPAIR INCISIONAL WITH MESH;  Surgeon: MarJamesetta SoD;  Location: [facility] ORS;  Service: General;  Laterality: N/A;  . Colonoscopy N/A 09/18/2014    Procedure: COLONOSCOPY;  Surgeon:  SanDanie BinderD;  Location: [facility] ENDO SUITE;  Service: Endoscopy;  Laterality: N/A;  8:30 AM - moved to 10:30 - CRosendo Gros notify pt  . Cardiac catheterization N/A 04/17/2015    Procedure: Left Heart Cath and Coronary Angiography;  Surgeon: ThoTroy SineD;  Location: [facility] [place] [facility];  Service: Cardiovascular;  Laterality: N/A;   Family History  Problem Relation Age of Onset  . Diabetes Mother   . Hypertension Mother   . Diabetes Father   . Asthma    . Cancer    . Colon cancer Neg Hx    Social History  Substance Use Topics  . Smoking status: Former Smoker -- 20 years    Types: Cigarettes    Quit date: 08/13/2006  . Smokeless tobacco: Never Used  . Alcohol Use: No    Review of Systems ROS: Statement: All systems negative except as marked or noted in the HPI; Constitutional: Negative for fever and chills. ; ; Eyes: Negative for eye pain, redness and discharge. ; ; ENMT: Negative for ear pain, hoarseness, nasal congestion, sinus pressure and sore throat. ; ; Cardiovascular: Negative for chest pain, palpitations, diaphoresis, and peripheral edema. ; ; Respiratory: +cough, wheezing,  SOB. Negative for stridor. ; ; Gastrointestinal: Negative for nausea, vomiting, diarrhea, abdominal pain, blood in stool, hematemesis, jaundice and rectal bleeding. . ; ; Genitourinary: Negative for dysuria, flank pain and hematuria. ; ; Musculoskeletal: Negative for back pain and neck pain. Negative for swelling and trauma.; ; Skin: Negative for pruritus, rash, abrasions, blisters, bruising and skin lesion.; ; Neuro: Negative for headache, lightheadedness and neck stiffness. Negative for weakness, altered level of consciousness, altered mental status, extremity weakness, paresthesias, involuntary movement, seizure and syncope.     Allergies  Lisinopril  Home Medications   Prior to Admission medications   Medication Sig Start Date End Date Taking? Authorizing Provider  albuterol (PROAIR HFA) 108 (90  BASE) MCG/ACT inhaler Inhale 2 puffs into the lungs every 4 (four) hours as needed for wheezing or shortness of breath.   Yes Historical Provider, MD  alendronate (FOSAMAX) 70 MG tablet Take 70 mg by mouth once a week. On Fridays 03/14/15  Yes Historical Provider, MD  ALPRAZolam Duanne Moron) 0.25 MG tablet Take 0.25 mg by mouth at bedtime.  04/30/15  Yes Historical Provider, MD  aspirin EC 81 MG tablet Take 81 mg by mouth daily.   Yes Historical Provider, MD  atorvastatin (LIPITOR) 80 MG tablet Take 1 tablet by mouth every morning. 06/30/15  Yes Historical Provider, MD  benzonatate (TESSALON) 100 MG capsule Take 100 mg by mouth 3 (three) times daily as needed. For cough 04/15/16  Yes Historical Provider, MD  buPROPion (WELLBUTRIN SR) 150 MG 12 hr tablet Take 150 mg by mouth 2 (two) times daily.   Yes Historical Provider, MD  Cholecalciferol (VITAMIN D-3) 1000 UNITS CAPS Take 1 capsule by mouth daily.   Yes Historical Provider, MD  fish oil-omega-3 fatty acids 1000 MG capsule Take 1 g by mouth 2 (two) times daily.    Yes Historical Provider, MD  furosemide (LASIX) 40 MG tablet Take 1 tablet (40 mg total) by mouth daily. 08/02/15  Yes Rosita Fire, MD  gabapentin (NEURONTIN) 300 MG capsule Take 300 mg by mouth daily.    Yes Historical Provider, MD  Garlic 431 MG TABS Take 300 mg by mouth daily.   Yes Historical Provider, MD  ipratropium-albuterol (DUONEB) 0.5-2.5 (3) MG/3ML SOLN Inhale 3 mLs into the lungs every 4 (four) hours as needed (wheezing/shortness of breath).  09/05/14  Yes Historical Provider, MD  linagliptin (TRADJENTA) 5 MG TABS tablet Take 5 mg by mouth daily.   Yes Historical Provider, MD  losartan (COZAAR) 50 MG tablet Take 50 mg by mouth daily. 06/29/15  Yes Historical Provider, MD  meclizine (ANTIVERT) 25 MG tablet Take 1 tablet by mouth 3 (three) times daily as needed for dizziness.  09/05/14  Yes Historical Provider, MD  metoprolol succinate (TOPROL-XL) 25 MG 24 hr tablet Take 1 tablet (25 mg  total) by mouth daily. 06/26/15  Yes Jettie Booze, MD  nitroGLYCERIN (NITROSTAT) 0.4 MG SL tablet Place 0.4 mg under the tongue every 5 (five) minutes as needed for chest pain.   Yes Historical Provider, MD  omeprazole (PRILOSEC) 20 MG capsule Take 20 mg by mouth daily.   Yes Historical Provider, MD  OXYGEN Inhale 3 L into the lungs continuous.   Yes Historical Provider, MD  pyridoxine (B-6) 100 MG tablet Take 100 mg by mouth daily.   Yes Historical Provider, MD  rOPINIRole (REQUIP) 0.25 MG tablet Take 0.25 mg by mouth 3 (three) times daily.  09/05/14  Yes Historical Provider, MD  sodium chloride (OCEAN) 0.65 %  SOLN nasal spray Place 1 spray into both nostrils as needed for congestion.   Yes Historical Provider, MD   BP 129/56 mmHg  Pulse 77  Resp 16  SpO2 93%   Patient Vitals for the past 24 hrs:  BP Pulse Resp SpO2  04/24/16 2330 129/56 mmHg 77 16 93 %  04/24/16 2300 (!) 124/112 mmHg 73 15 100 %  04/24/16 2239 - - - 98 %  04/24/16 2230 (!) 106/47 mmHg 71 15 96 %     Physical Exam  2215: Physical examination:  Nursing notes reviewed; Vital signs and O2 SAT reviewed;  Constitutional: Well developed, Well nourished, Well hydrated, Uncomfortable appearing.; Head:  Normocephalic, atraumatic; Eyes: EOMI, PERRL, No scleral icterus; ENMT: Mouth and pharynx normal, Mucous membranes moist; Neck: Supple, Full range of motion, No lymphadenopathy; Cardiovascular: Regular rate and rhythm, No gallop; Respiratory: Breath sounds diminished & equal bilaterally, faint scattered wheezes.  Speaking short sentences, sitting upright, tachypneic.; Chest: Nontender, Movement normal; Abdomen: Soft, Nontender, Nondistended, Normal bowel sounds; Genitourinary: No CVA tenderness; Extremities: Pulses normal, No tenderness, No edema, No calf edema or asymmetry.; Neuro: AA&Ox3, Major CN grossly intact.  Speech clear. No gross focal motor or sensory deficits in extremities.; Skin: Color normal, Warm, Dry.   ED  Course  Procedures (including critical care time) Labs Review   Imaging Review  I have personally reviewed and evaluated these images and lab results as part of my medical decision-making.   EKG Interpretation   Date/Time:  Thursday Apr 24 2016 22:07:04 EDT Ventricular Rate:  76 PR Interval:  152 QRS Duration: 89 QT Interval:  413 QTC Calculation: 464 R Axis:   45 Text Interpretation:  Sinus rhythm Probable anteroseptal infarct, old  Baseline wander Artifact When compared with ECG of 12/21/2015 No  significant change was found Confirmed by [place]  MD, Nunzio Cory [phone]) on  04/24/2016 10:18:37 PM      MDM  MDM Reviewed: previous chart, nursing note and vitals Reviewed previous: labs and ECG Interpretation: labs, ECG and x-ray Total time providing critical care: 30-74 minutes. This excludes time spent performing separately reportable procedures and services. Consults: admitting MD   CRITICAL CARE Performed by: Alfonzo Feller Total critical care time: 35 minutes Critical care time was exclusive of separately billable procedures and treating other patients. Critical care was necessary to treat or prevent imminent or life-threatening deterioration. Critical care was time spent personally by me on the following activities: development of treatment plan with patient and/or surrogate as well as nursing, discussions with consultants, evaluation of patient's response to treatment, examination of patient, obtaining history from patient or surrogate, ordering and performing treatments and interventions, ordering and review of laboratory studies, ordering and review of radiographic studies, pulse oximetry and re-evaluation of patient's condition.    Results for orders placed or performed during the hospital encounter of 35/45/62  Basic metabolic panel  Result Value Ref Range   Sodium 138 135 - 145 mmol/L   Potassium 3.9 3.5 - 5.1 mmol/L   Chloride 101 101 - 111 mmol/L   CO2 29  22 - 32 mmol/L   Glucose, Bld 147 (H) 65 - 99 mg/dL   BUN 22 (H) 6 - 20 mg/dL   Creatinine, Ser 1.39 (H) 0.44 - 1.00 mg/dL   Calcium 8.7 (L) 8.9 - 10.3 mg/dL   GFR calc non Af Amer 35 (L) >60 mL/min   GFR calc Af Amer 40 (L) >60 mL/min   Anion gap 8 5 - 15  Brain  natriuretic peptide  Result Value Ref Range   B Natriuretic Peptide 20.0 0.0 - 100.0 pg/mL  Troponin I  Result Value Ref Range   Troponin I <0.03 <0.031 ng/mL  CBC with Differential  Result Value Ref Range   WBC 7.6 4.0 - 10.5 K/uL   RBC 4.26 3.87 - 5.11 MIL/uL   Hemoglobin 13.0 12.0 - 15.0 g/dL   HCT 41.2 36.0 - 46.0 %   MCV 96.7 78.0 - 100.0 fL   MCH 30.5 26.0 - 34.0 pg   MCHC 31.6 30.0 - 36.0 g/dL   RDW 13.9 11.5 - 15.5 %   Platelets 181 150 - 400 K/uL   Neutrophils Relative % 57 %   Neutro Abs 4.3 1.7 - 7.7 K/uL   Lymphocytes Relative 23 %   Lymphs Abs 1.8 0.7 - 4.0 K/uL   Monocytes Relative 12 %   Monocytes Absolute 0.9 0.1 - 1.0 K/uL   Eosinophils Relative 8 %   Eosinophils Absolute 0.6 0.0 - 0.7 K/uL   Basophils Relative 0 %   Basophils Absolute 0.0 0.0 - 0.1 K/uL   Dg Chest Port 1 View 04/24/2016  CLINICAL DATA:  Initial valuation for acute worsening shortness of breath. EXAM: PORTABLE CHEST 1 VIEW COMPARISON:  Prior radiograph from 01/24/2016. FINDINGS: Cardiomegaly is stable from prior. Mediastinal silhouettes within normal limits. Diffuse vascular congestion with interstitial prominence, suggestive of mild pulmonary interstitial edema. No definite pleural effusion. Deformity at the left lower ribs with elevation of the lateral left hemidiaphragm is stable. No focal infiltrates. No pneumothorax. No acute osseous abnormality. IMPRESSION: Stable cardiomegaly with findings suggestive of mild diffuse interstitial edema. Electronically Signed   By: Jeannine Boga M.D.   On: 04/24/2016 23:37   Results for MISHA, VANOVERBEKE (MRN [id number]) as of 04/25/2016 00:10  Ref. Range 12/21/2015 06:23 01/24/2016 14:33  04/24/2016 22:25  BUN Latest Ref Range: 6-20 mg/dL 32 (H) 21 (H) 22 (H)  Creatinine Latest Ref Range: 0.44-1.00 mg/dL 1.98 (H) 1.46 (H) 1.39 (H)    0005:  On arrival: pt sitting upright, tachypneic, Sats 96-98 % on her usual O2 3L N/C, lungs diminished. IV solumedrol and hour long neb started. After neb: pt appears more comfortable at rest, less tachypneic, Sats 93-100 % on O2 3L N/C, lungs continue diminished. Pt ambulated in hallway: pt's O2 Sats dropped to 83 % while on her usual O2 3L N/C, with pt c/o increasing SOB, with increasing HR and RR, lungs with scattered wheezing. Pt escorted back to stretcher with O2 Sats slowly increasing to 93 % on O2 3L N/C.  Will dose another neb tx; admit. Dx and testing d/w pt and family.  Questions answered.  Verb understanding, agreeable to admit. T/C to [facility] Dr. Shanon Brow, case discussed, including:  HPI, pertinent PM/SHx, VS/PE, dx testing, ED course and treatment:  Agreeable to admit, requests to write temporary orders, obtain observation tele bed to Dr. Josephine Cables service.     Francine Graven, DO 04/26/16 [phone]

## 2016-04-24 NOTE — ED Notes (Signed)
Pt states that she is feeling better,

## 2016-04-24 NOTE — ED Notes (Signed)
MD at bedside. 

## 2016-04-25 ENCOUNTER — Other Ambulatory Visit: Payer: Self-pay

## 2016-04-25 ENCOUNTER — Observation Stay (HOSPITAL_COMMUNITY): Payer: Medicare Other

## 2016-04-25 DIAGNOSIS — J9621 Acute and chronic respiratory failure with hypoxia: Secondary | ICD-10-CM | POA: Diagnosis not present

## 2016-04-25 DIAGNOSIS — Z87891 Personal history of nicotine dependence: Secondary | ICD-10-CM | POA: Diagnosis not present

## 2016-04-25 DIAGNOSIS — J449 Chronic obstructive pulmonary disease, unspecified: Secondary | ICD-10-CM | POA: Diagnosis not present

## 2016-04-25 DIAGNOSIS — J441 Chronic obstructive pulmonary disease with (acute) exacerbation: Secondary | ICD-10-CM | POA: Diagnosis not present

## 2016-04-25 DIAGNOSIS — N183 Chronic kidney disease, stage 3 (moderate): Secondary | ICD-10-CM | POA: Diagnosis not present

## 2016-04-25 DIAGNOSIS — Z79899 Other long term (current) drug therapy: Secondary | ICD-10-CM | POA: Diagnosis not present

## 2016-04-25 DIAGNOSIS — I130 Hypertensive heart and chronic kidney disease with heart failure and stage 1 through stage 4 chronic kidney disease, or unspecified chronic kidney disease: Secondary | ICD-10-CM | POA: Diagnosis present

## 2016-04-25 DIAGNOSIS — R0602 Shortness of breath: Secondary | ICD-10-CM | POA: Diagnosis not present

## 2016-04-25 DIAGNOSIS — Z9981 Dependence on supplemental oxygen: Secondary | ICD-10-CM | POA: Diagnosis not present

## 2016-04-25 DIAGNOSIS — J9690 Respiratory failure, unspecified, unspecified whether with hypoxia or hypercapnia: Secondary | ICD-10-CM | POA: Diagnosis not present

## 2016-04-25 DIAGNOSIS — Z825 Family history of asthma and other chronic lower respiratory diseases: Secondary | ICD-10-CM | POA: Diagnosis not present

## 2016-04-25 DIAGNOSIS — R05 Cough: Secondary | ICD-10-CM | POA: Diagnosis not present

## 2016-04-25 DIAGNOSIS — Z85118 Personal history of other malignant neoplasm of bronchus and lung: Secondary | ICD-10-CM | POA: Diagnosis not present

## 2016-04-25 DIAGNOSIS — J189 Pneumonia, unspecified organism: Secondary | ICD-10-CM | POA: Diagnosis not present

## 2016-04-25 DIAGNOSIS — E1122 Type 2 diabetes mellitus with diabetic chronic kidney disease: Secondary | ICD-10-CM | POA: Diagnosis present

## 2016-04-25 DIAGNOSIS — Z7982 Long term (current) use of aspirin: Secondary | ICD-10-CM | POA: Diagnosis not present

## 2016-04-25 DIAGNOSIS — I10 Essential (primary) hypertension: Secondary | ICD-10-CM | POA: Diagnosis not present

## 2016-04-25 DIAGNOSIS — I509 Heart failure, unspecified: Secondary | ICD-10-CM | POA: Diagnosis present

## 2016-04-25 DIAGNOSIS — Z8249 Family history of ischemic heart disease and other diseases of the circulatory system: Secondary | ICD-10-CM | POA: Diagnosis not present

## 2016-04-25 DIAGNOSIS — R918 Other nonspecific abnormal finding of lung field: Secondary | ICD-10-CM | POA: Diagnosis not present

## 2016-04-25 DIAGNOSIS — Z833 Family history of diabetes mellitus: Secondary | ICD-10-CM | POA: Diagnosis not present

## 2016-04-25 DIAGNOSIS — M810 Age-related osteoporosis without current pathological fracture: Secondary | ICD-10-CM | POA: Diagnosis present

## 2016-04-25 LAB — CBC
HCT: 39.7 % (ref 36.0–46.0)
Hemoglobin: 12.5 g/dL (ref 12.0–15.0)
MCH: 30.3 pg (ref 26.0–34.0)
MCHC: 31.5 g/dL (ref 30.0–36.0)
MCV: 96.4 fL (ref 78.0–100.0)
Platelets: 211 10*3/uL (ref 150–400)
RBC: 4.12 MIL/uL (ref 3.87–5.11)
RDW: 13.9 % (ref 11.5–15.5)
WBC: 8.7 10*3/uL (ref 4.0–10.5)

## 2016-04-25 LAB — BASIC METABOLIC PANEL
Anion gap: 10 (ref 5–15)
BUN: 23 mg/dL — ABNORMAL HIGH (ref 6–20)
CO2: 24 mmol/L (ref 22–32)
Calcium: 8.8 mg/dL — ABNORMAL LOW (ref 8.9–10.3)
Chloride: 102 mmol/L (ref 101–111)
Creatinine, Ser: 1.41 mg/dL — ABNORMAL HIGH (ref 0.44–1.00)
GFR calc Af Amer: 40 mL/min — ABNORMAL LOW (ref >60)
GFR calc non Af Amer: 34 mL/min — ABNORMAL LOW (ref >60)
Glucose, Bld: 377 mg/dL — ABNORMAL HIGH (ref 65–99)
Potassium: 4.1 mmol/L (ref 3.5–5.1)
Sodium: 136 mmol/L (ref 135–145)

## 2016-04-25 LAB — GLUCOSE, CAPILLARY
Glucose-Capillary: 294 mg/dL — ABNORMAL HIGH (ref 65–99)
Glucose-Capillary: 326 mg/dL — ABNORMAL HIGH (ref 65–99)
Glucose-Capillary: 264 mg/dL — ABNORMAL HIGH (ref 65–99)

## 2016-04-25 MED ORDER — ALENDRONATE SODIUM 10 MG PO TABS: 70.0000 mg | ORAL_TABLET | Freq: Every day | ORAL | Status: DC

## 2016-04-25 MED ORDER — IPRATROPIUM-ALBUTEROL 0.5-2.5 (3) MG/3ML IN SOLN
3.0000 mL | Freq: Four times a day (QID) | RESPIRATORY_TRACT | Status: DC
  Administered 2016-04-26 – 2016-04-28 (×8): 3 mL via RESPIRATORY_TRACT
  Filled 2016-04-25 (×9): qty 3

## 2016-04-25 MED ORDER — METHYLPREDNISOLONE SODIUM SUCC 125 MG IJ SOLR
80.0000 mg | Freq: Four times a day (QID) | INTRAMUSCULAR | Status: DC
  Administered 2016-04-25 – 2016-04-27 (×9): 80 mg via INTRAVENOUS
  Filled 2016-04-25 (×8): qty 2

## 2016-04-25 MED ORDER — ROPINIROLE HCL 0.25 MG PO TABS
0.2500 mg | ORAL_TABLET | Freq: Three times a day (TID) | ORAL | Status: DC
  Administered 2016-04-25 – 2016-04-27 (×9): 0.25 mg via ORAL
  Filled 2016-04-25 (×16): qty 1

## 2016-04-25 MED ORDER — VITAMIN D 1000 UNITS PO TABS
1000.0000 [IU] | ORAL_TABLET | Freq: Every day | ORAL | Status: DC
  Administered 2016-04-25 – 2016-04-28 (×4): 1000 [IU] via ORAL
  Filled 2016-04-25 (×4): qty 1

## 2016-04-25 MED ORDER — OMEGA-3-ACID ETHYL ESTERS 1 G PO CAPS
1.0000 g | ORAL_CAPSULE | Freq: Two times a day (BID) | ORAL | Status: DC
  Administered 2016-04-25 – 2016-04-28 (×7): 1 g via ORAL
  Filled 2016-04-25 (×7): qty 1

## 2016-04-25 MED ORDER — LOSARTAN POTASSIUM 50 MG PO TABS
50.0000 mg | ORAL_TABLET | Freq: Every day | ORAL | Status: DC
  Administered 2016-04-25 – 2016-04-28 (×4): 50 mg via ORAL
  Filled 2016-04-25 (×3): qty 1

## 2016-04-25 MED ORDER — VITAMIN B-6 50 MG PO TABS
100.0000 mg | ORAL_TABLET | Freq: Every day | ORAL | Status: DC
  Administered 2016-04-25 – 2016-04-28 (×4): 100 mg via ORAL
  Filled 2016-04-25 (×4): qty 2

## 2016-04-25 MED ORDER — GUAIFENESIN ER 600 MG PO TB12
600.0000 mg | ORAL_TABLET | Freq: Two times a day (BID) | ORAL | Status: DC
  Administered 2016-04-25 – 2016-04-28 (×7): 600 mg via ORAL
  Filled 2016-04-25 (×7): qty 1

## 2016-04-25 MED ORDER — METHYLPREDNISOLONE SODIUM SUCC 125 MG IJ SOLR: 80.0000 mg | Freq: Two times a day (BID) | INTRAMUSCULAR | Status: DC

## 2016-04-25 MED ORDER — AZITHROMYCIN 250 MG PO TABS: 500.0000 mg | ORAL_TABLET | Freq: Every day | ORAL | Status: DC

## 2016-04-25 MED ORDER — IPRATROPIUM-ALBUTEROL 0.5-2.5 (3) MG/3ML IN SOLN
3.0000 mL | Freq: Once | RESPIRATORY_TRACT | Status: AC
  Administered 2016-04-25: 3 mL via RESPIRATORY_TRACT
  Filled 2016-04-25: qty 3

## 2016-04-25 MED ORDER — BUPROPION HCL ER (SR) 150 MG PO TB12
150.0000 mg | ORAL_TABLET | Freq: Two times a day (BID) | ORAL | Status: DC
  Administered 2016-04-25 – 2016-04-27 (×6): 150 mg via ORAL
  Filled 2016-04-25 (×12): qty 1

## 2016-04-25 MED ORDER — GABAPENTIN 300 MG PO CAPS
300.0000 mg | ORAL_CAPSULE | Freq: Every day | ORAL | Status: DC
  Administered 2016-04-25 – 2016-04-28 (×4): 300 mg via ORAL
  Filled 2016-04-25 (×4): qty 1

## 2016-04-25 MED ORDER — GARLIC 300 MG PO TABS: 300.0000 mg | ORAL_TABLET | Freq: Every day | ORAL | Status: DC

## 2016-04-25 MED ORDER — METOPROLOL SUCCINATE ER 25 MG PO TB24
25.0000 mg | ORAL_TABLET | Freq: Every day | ORAL | Status: DC
  Administered 2016-04-25 – 2016-04-28 (×4): 25 mg via ORAL
  Filled 2016-04-25 (×4): qty 1

## 2016-04-25 MED ORDER — LINAGLIPTIN 5 MG PO TABS
5.0000 mg | ORAL_TABLET | Freq: Every day | ORAL | Status: DC
  Administered 2016-04-25 – 2016-04-28 (×4): 5 mg via ORAL
  Filled 2016-04-25 (×3): qty 1

## 2016-04-25 MED ORDER — IPRATROPIUM-ALBUTEROL 0.5-2.5 (3) MG/3ML IN SOLN
2.5000 mg | Freq: Four times a day (QID) | RESPIRATORY_TRACT | Status: DC
  Administered 2016-04-25 (×2): 3 mg via RESPIRATORY_TRACT
  Administered 2016-04-25: 2.5 mg via RESPIRATORY_TRACT
  Filled 2016-04-25 (×3): qty 3

## 2016-04-25 MED ORDER — ASPIRIN EC 81 MG PO TBEC
81.0000 mg | DELAYED_RELEASE_TABLET | Freq: Every day | ORAL | Status: DC
  Administered 2016-04-25 – 2016-04-28 (×4): 81 mg via ORAL
  Filled 2016-04-25 (×4): qty 1

## 2016-04-25 MED ORDER — SODIUM CHLORIDE 0.9 % IV SOLN: 250.0000 mL | INTRAVENOUS | Status: DC | PRN

## 2016-04-25 MED ORDER — CETYLPYRIDINIUM CHLORIDE 0.05 % MT LIQD
7.0000 mL | Freq: Two times a day (BID) | OROMUCOSAL | Status: DC
  Administered 2016-04-25 – 2016-04-28 (×7): 7 mL via OROMUCOSAL

## 2016-04-25 MED ORDER — ATORVASTATIN CALCIUM 40 MG PO TABS
80.0000 mg | ORAL_TABLET | Freq: Every morning | ORAL | Status: DC
  Administered 2016-04-25 – 2016-04-28 (×4): 80 mg via ORAL
  Filled 2016-04-25 (×3): qty 2
  Filled 2016-04-25: qty 8

## 2016-04-25 MED ORDER — SODIUM CHLORIDE 0.9% FLUSH
3.0000 mL | Freq: Two times a day (BID) | INTRAVENOUS | Status: DC
  Administered 2016-04-27 – 2016-04-28 (×2): 3 mL via INTRAVENOUS

## 2016-04-25 MED ORDER — ALBUTEROL SULFATE (2.5 MG/3ML) 0.083% IN NEBU
2.5000 mg | INHALATION_SOLUTION | RESPIRATORY_TRACT | Status: DC | PRN
  Administered 2016-04-25: 2.5 mg via RESPIRATORY_TRACT
  Filled 2016-04-25: qty 3

## 2016-04-25 MED ORDER — ALBUTEROL SULFATE (2.5 MG/3ML) 0.083% IN NEBU
2.5000 mg | INHALATION_SOLUTION | Freq: Once | RESPIRATORY_TRACT | Status: AC
  Administered 2016-04-25: 2.5 mg via RESPIRATORY_TRACT
  Filled 2016-04-25: qty 3

## 2016-04-25 MED ORDER — PIPERACILLIN-TAZOBACTAM 3.375 G IVPB
3.3750 g | Freq: Three times a day (TID) | INTRAVENOUS | Status: DC
Start: 2016-04-25 — End: 2016-04-28
  Administered 2016-04-25 – 2016-04-28 (×9): 3.375 g via INTRAVENOUS
  Filled 2016-04-25 (×8): qty 50

## 2016-04-25 MED ORDER — IPRATROPIUM BROMIDE 0.02 % IN SOLN: 0.5000 mg | Freq: Four times a day (QID) | RESPIRATORY_TRACT | Status: DC

## 2016-04-25 MED ORDER — INSULIN ASPART 100 UNIT/ML ~~LOC~~ SOLN
0.0000 [IU] | Freq: Three times a day (TID) | SUBCUTANEOUS | Status: DC
  Administered 2016-04-25: 11 [IU] via SUBCUTANEOUS
  Administered 2016-04-25 – 2016-04-26 (×3): 8 [IU] via SUBCUTANEOUS
  Administered 2016-04-26: 5 [IU] via SUBCUTANEOUS

## 2016-04-25 MED ORDER — SALINE SPRAY 0.65 % NA SOLN: 1.0000 | NASAL | Status: DC | PRN

## 2016-04-25 MED ORDER — FUROSEMIDE 40 MG PO TABS
40.0000 mg | ORAL_TABLET | Freq: Every day | ORAL | Status: DC
  Administered 2016-04-25 – 2016-04-28 (×4): 40 mg via ORAL
  Filled 2016-04-25 (×4): qty 1

## 2016-04-25 MED ORDER — SODIUM CHLORIDE 0.9% FLUSH: 3.0000 mL | INTRAVENOUS | Status: DC | PRN

## 2016-04-25 MED ORDER — MECLIZINE HCL 12.5 MG PO TABS: 25.0000 mg | ORAL_TABLET | Freq: Three times a day (TID) | ORAL | Status: DC | PRN

## 2016-04-25 MED ORDER — ALPRAZOLAM 0.25 MG PO TABS
0.2500 mg | ORAL_TABLET | Freq: Every day | ORAL | Status: DC
  Administered 2016-04-25 – 2016-04-27 (×3): 0.25 mg via ORAL
  Filled 2016-04-25 (×3): qty 1

## 2016-04-25 MED ORDER — SODIUM CHLORIDE 0.9% FLUSH
3.0000 mL | Freq: Two times a day (BID) | INTRAVENOUS | Status: DC
  Administered 2016-04-25 – 2016-04-28 (×6): 3 mL via INTRAVENOUS

## 2016-04-25 MED ORDER — AZITHROMYCIN 250 MG PO TABS: 250.0000 mg | ORAL_TABLET | Freq: Every day | ORAL | Status: DC

## 2016-04-25 MED ORDER — ALBUTEROL SULFATE (2.5 MG/3ML) 0.083% IN NEBU: 2.5000 mg | INHALATION_SOLUTION | RESPIRATORY_TRACT | Status: DC

## 2016-04-25 NOTE — Progress Notes (Signed)
Notified Dr. Legrand Rams that the patients blood glucose result was 377 this am.  Recommended to him that the patient be started on sliding scale insulin and CBG's.  New orders given and followed.

## 2016-04-25 NOTE — Care Management Important Message (Signed)
Important Message  Patient Details  Name: Angela Lawson MRN: [id number] Date of Birth: 10/31/1934   Medicare Important Message Given:  Yes    Barbar Brede, Chauncey Reading, RN 04/25/2016, 9:16 AM

## 2016-04-25 NOTE — Progress Notes (Signed)
PT Cancellation Note  Patient Details Name: Angela Lawson MRN: [id number] DOB: 01/14/1934   Cancelled Treatment:    Reason Eval/Treat Not Completed: Other (comment) Attempted to see pt for initial PT evaluation, however pt just returned from using the bathroom, and is SOB, and requesting a breathing tx.  RN notified.  Will check back.   Beth Leesha Veno, PT, DPT X: [phone]   04/25/2016, 2:42 PM

## 2016-04-25 NOTE — Evaluation (Signed)
Clinical/Bedside Swallow Evaluation Patient Details  Name: Angela Lawson MRN: [id number] Date of Birth: 12/02/1934  Today's Date: 04/25/2016 Time: SLP Start Time (ACUTE ONLY): 1215 SLP Stop Time (ACUTE ONLY): 1234 SLP Time Calculation (min) (ACUTE ONLY): 19 min  Past Medical History:  Past Medical History  Diagnosis Date  . Essential hypertension   . Arthritis   . Diverticulitis   . Type II diabetes mellitus ([place])   . H/O ventral hernia   . COPD (chronic obstructive pulmonary disease) ([place])   . Osteoporosis   . Adenocarcinoma of lung ([place])     Left lung 2009, resected  . Non-obstructive CAD     a. 04/2015 NSTEMI/Cath: LAD 10p, LCX 78m RCA 356m20d, EF 35-40 w/ apical ballooning.  . Takotsubo cardiomyopathy     a. 04/2015 Echo: EF 45-50%, mid-dist anterior/apical/inferoapical HK w/ hyperdynamic base. Gr 1 DD, mild AI, mild-mod MR, triv TR, PASP 4848m;  b. 04/2015 LV gram: Ef 35-40% w/ apical ballooning.  . DMarland Kitchenslipidemia   . Ventricular bigeminy     a. 04/2015 in setting of NSTEMI/Takotsubo.  . CHF (congestive heart failure) [place] . Osteoporosis 11/04/2015    Managed by Dr. FanLegrand Rams. On home O2     3L N/C   Past Surgical History:  Past Surgical History  Procedure Laterality Date  . Cholecystectomy    . Ectopic pregnancy surgery    . Abdominal hysterectomy    . Lung cancer surgery    . Incisional hernia repair N/A 08/26/2013    Procedure: HERNIA REPAIR INCISIONAL WITH MESH;  Surgeon: MarJamesetta SoD;  Location: [facility];  Service: General;  Laterality: N/A;  . Colonoscopy N/A 09/18/2014    Procedure: COLONOSCOPY;  Surgeon: SanDanie BinderD;  [facility]: [facility];  Service: Endoscopy;  Laterality: N/A;  8:30 AM - moved to 10:30 - CRosendo Gros notify pt  . Cardiac catheterization N/A 04/17/2015    Procedure: Left Heart Cath and Coronary Angiography;  Surgeon: ThoTroy SineD;  Location: [facility];  Service: Cardiovascular;  Laterality: N/A;   HPI:  [age] female  h/o copd, on 3 liters [place] at home chronically, htn, CAD, chf comes in with family for several days of sob and wheezing. Pt denies any fevers. Has been coughing. No swelling. She says she feels much better now with the treatment she has received in the ED including an hour long neb and solumedrol.MD, Dr. FanLegrand Ramsports in recent consult note "When I went in to see her she was swallowing some food and clearly aspirated." Chest X-Ray from 5/18 reveals "No acute osseous abnormality." although Dr. FanLegrand Ramss ordered another Chest X-Ray. SLP to complete besdide swallow evaluation and treat as indicated   Assessment / Plan / Recommendation Clinical Impression  Pt was provided BSE secondary to physician noting overt coughing during PO intake earlier this am. Pt was evaluated at bedside during lunch demonstrating no overt s/sx of aspiration with any textures/consistencies presented; patient demonstrated one brief coughing episode that did not seem to be related to PO intake. She also demonstrated some shortness of breath during intake which increases her risk of aspiration. She reports that she does occasionally have coughing/ "choking" episodes when she asks her family to "bang me on the back" and that "really helps". SLP provided education of Pt's increased risk of aspiration d/t compromised respiratory status and was further educated of safe swallowing strategies including: alternating bites and sips, not talking  during intake, safe swallowing positioning, small bites and sips and avoiding foods that generate difficulty (dry foods, corn bread, popcorn, etc.) and slow rate. It is possible that pt is experiencing episodic aspiration. SLP will continue to monitor while in acute setting. Recommend continue with regular/thin diet, meds whole in liquids and implementation of all strategies above noted and provided to patient.  MBS can be completed/ordered if physician requests to objectively assess the swallow although  question if pt will manifest an episode during objective test.    Aspiration Risk  Mild aspiration risk    Diet Recommendation Regular;Thin liquid   Liquid Administration via: Straw Medication Administration: Whole meds with liquid Supervision: Intermittent supervision to cue for compensatory strategies Compensations: Slow rate;Minimize environmental distractions;Small sips/bites;Clear throat intermittently;Follow solids with liquid    Other  Recommendations Oral Care Recommendations: Oral care BID      Frequency and Duration min 1 x/week  2 weeks           Swallow Study   General Date of Onset: 04/24/16 HPI: 82 yo female h/o copd, on 3 liters [place] at home chronically, htn, CAD, chf comes in with family for several days of sob and wheezing. Pt denies any fevers. Has been coughing. No swelling. She says she feels much better now with the treatment she has received in the ED including an hour long neb and solumedrol.MD, Dr. Legrand Rams reports in recent consult note "When I went in to see her she was swallowing some food and clearly aspirated." Chest X-Ray from 5/18 reveals "No acute osseous abnormality." although Dr. Legrand Rams has ordered another Chest X-Ray. SLP to complete besdide swallow evaluation and treat as indicated Type of Study: Bedside Swallow Evaluation Previous Swallow Assessment: none Diet Prior to this Study: Regular;Thin liquids Temperature Spikes Noted: No Respiratory Status: Nasal cannula History of Recent Intubation: No Behavior/Cognition: Alert;Cooperative;Pleasant mood Oral Cavity Assessment: Within Functional Limits Oral Cavity - Dentition: Dentures, top;Dentures, bottom Vision: Functional for self-feeding Self-Feeding Abilities: Able to feed self Patient Positioning: Upright in bed Baseline Vocal Quality: Normal Volitional Cough: Strong Volitional Swallow: Able to elicit    Oral/Motor/Sensory Function Overall Oral Motor/Sensory Function: Within functional limits    Ice Chips Ice chips: Not tested   Thin Liquid Thin Liquid: Within functional limits             Solid   GO   Solid: Within functional limits    Functional Limitations: Swallowing Swallow Current Status (S1779): At least 20 percent but less than 40 percent impaired, limited or restricted Swallow Goal Status [phone]): At least 20 percent but less than 40 percent impaired, limited or restricted Swallow Discharge Status [phone]): At least 20 percent but less than 40 percent impaired, limited or restricted   Zi Newbury H. Roddie Mc, CCC-SLP Speech Language Pathologist   Wende Bushy 04/25/2016,12:45 PM

## 2016-04-25 NOTE — H&P (Signed)
PCP:   [name],TESFAYE, MD   Chief Complaint:  Sob and wheezing  HPI: 82 yo female h/o copd, on 3 liters [place] at home chronically, htn, CAD, chf comes in with family for several days of sob and wheezing.  Pt denies any fevers.  Has been coughing.  No swelling.  She says she feels much better now with the treatment she has received in the ED including an hour long neb and solumedrol.  She was ambulated in the ED and her oxygen sats dropped to the mid 80s on her 3 liters so she was therefore referred for admission.  Review of Systems:  Positive and negative as per HPI otherwise all other systems are negative  Past Medical History: Past Medical History  Diagnosis Date  . Essential hypertension   . Arthritis   . Diverticulitis   . Type II diabetes mellitus ([place])   . H/O ventral hernia   . COPD (chronic obstructive pulmonary disease) ([place])   . Osteoporosis   . Adenocarcinoma of lung ([place])     Left lung 2009, resected  . Non-obstructive CAD     a. 04/2015 NSTEMI/Cath: LAD 10p, LCX 70m RCA 358m20d, EF 35-40 w/ apical ballooning.  . Takotsubo cardiomyopathy     a. 04/2015 Echo: EF 45-50%, mid-dist anterior/apical/inferoapical HK w/ hyperdynamic base. Gr 1 DD, mild AI, mild-mod MR, triv TR, PASP 4822m;  b. 04/2015 LV gram: Ef 35-40% w/ apical ballooning.  . DMarland Kitchenslipidemia   . Ventricular bigeminy     a. 04/2015 in setting of NSTEMI/Takotsubo.  . CHF (congestive heart failure) [place] . Osteoporosis 11/04/2015    Managed by Dr. FanLegrand Rams. On home O2     3L N/C   Past Surgical History  Procedure Laterality Date  . Cholecystectomy    . Ectopic pregnancy surgery    . Abdominal hysterectomy    . Lung cancer surgery    . Incisional hernia repair N/A 08/26/2013    Procedure: HERNIA REPAIR INCISIONAL WITH MESH;  Surgeon: MarJamesetta SoD;  Location: [facility];  Service: General;  Laterality: N/A;  . Colonoscopy N/A 09/18/2014    Procedure: COLONOSCOPY;  Surgeon: SanDanie BinderD;  [facility];  Service: Endoscopy;  Laterality: N/A;  8:30 AM - moved to 10:30 - CRosendo Gros notify pt  . Cardiac catheterization N/A 04/17/2015    Procedure: Left Heart Cath and Coronary Angiography;  Surgeon: ThoTroy SineD;  Location[facility];  Service: Cardiovascular;  Laterality: N/A;    Medications: Prior to Admission medications   Medication Sig Start Date End Date Taking? Authorizing Provider  albuterol (PROAIR HFA) 108 (90 BASE) MCG/ACT inhaler Inhale 2 puffs into the lungs every 4 (four) hours as needed for wheezing or shortness of breath.   Yes Historical Provider, MD  alendronate (FOSAMAX) 70 MG tablet Take 70 mg by mouth once a week. On Fridays 03/14/15  Yes Historical Provider, MD  ALPRAZolam (XADuanne Moron.25 MG tablet Take 0.25 mg by mouth at bedtime.  04/30/15  Yes Historical Provider, MD  aspirin EC 81 MG tablet Take 81 mg by mouth daily.   Yes Historical Provider, MD  atorvastatin (LIPITOR) 80 MG tablet Take 1 tablet by mouth every morning. 06/30/15  Yes Historical Provider, MD  benzonatate (TESSALON) 100 MG capsule Take 100 mg by mouth 3 (three) times daily as needed. For cough 04/15/16  Yes Historical Provider, MD  buPROPion (WELLBUTRIN SR) 150 MG 12  hr tablet Take 150 mg by mouth 2 (two) times daily.   Yes Historical Provider, MD  Cholecalciferol (VITAMIN D-3) 1000 UNITS CAPS Take 1 capsule by mouth daily.   Yes Historical Provider, MD  fish oil-omega-3 fatty acids 1000 MG capsule Take 1 g by mouth 2 (two) times daily.    Yes Historical Provider, MD  furosemide (LASIX) 40 MG tablet Take 1 tablet (40 mg total) by mouth daily. 08/02/15  Yes Rosita Fire, MD  gabapentin (NEURONTIN) 300 MG capsule Take 300 mg by mouth daily.    Yes Historical Provider, MD  Garlic 026 MG TABS Take 300 mg by mouth daily.   Yes Historical Provider, MD  ipratropium-albuterol (DUONEB) 0.5-2.5 (3) MG/3ML SOLN Inhale 3 mLs into the lungs every 4 (four) hours as needed (wheezing/shortness of breath).   09/05/14  Yes Historical Provider, MD  linagliptin (TRADJENTA) 5 MG TABS tablet Take 5 mg by mouth daily.   Yes Historical Provider, MD  losartan (COZAAR) 50 MG tablet Take 50 mg by mouth daily. 06/29/15  Yes Historical Provider, MD  meclizine (ANTIVERT) 25 MG tablet Take 1 tablet by mouth 3 (three) times daily as needed for dizziness.  09/05/14  Yes Historical Provider, MD  metoprolol succinate (TOPROL-XL) 25 MG 24 hr tablet Take 1 tablet (25 mg total) by mouth daily. 06/26/15  Yes Jettie Booze, MD  nitroGLYCERIN (NITROSTAT) 0.4 MG SL tablet Place 0.4 mg under the tongue every 5 (five) minutes as needed for chest pain.   Yes Historical Provider, MD  omeprazole (PRILOSEC) 20 MG capsule Take 20 mg by mouth daily.   Yes Historical Provider, MD  OXYGEN Inhale 3 L into the lungs continuous.   Yes Historical Provider, MD  pyridoxine (B-6) 100 MG tablet Take 100 mg by mouth daily.   Yes Historical Provider, MD  rOPINIRole (REQUIP) 0.25 MG tablet Take 0.25 mg by mouth 3 (three) times daily.  09/05/14  Yes Historical Provider, MD  sodium chloride (OCEAN) 0.65 % SOLN nasal spray Place 1 spray into both nostrils as needed for congestion.   Yes Historical Provider, MD    Allergies:   Allergies  Allergen Reactions  . Lisinopril Cough    Social History:  reports that she quit smoking about 9 years ago. Her smoking use included Cigarettes. She quit after 20 years of use. She has never used smokeless tobacco. She reports that she does not drink alcohol or use illicit drugs.  Family History: Family History  Problem Relation Age of Onset  . Diabetes Mother   . Hypertension Mother   . Diabetes Father   . Asthma    . Cancer    . Colon cancer Neg Hx     Physical Exam: Filed Vitals:   04/24/16 2330 04/25/16 0000 04/25/16 0030 04/25/16 0037  BP: 129/56 121/41 128/59   Pulse: 77 80 84   Resp: '16 15 18   '$ SpO2: 93% 96% 98% 98%   General appearance: alert Head: Normocephalic, without obvious  abnormality, atraumatic Eyes: negative Nose: Nares normal. Septum midline. Mucosa normal. No drainage or sinus tenderness. Neck: no JVD and supple, symmetrical, trachea midline Lungs: diminished breath sounds bilaterally Heart: regular rate and rhythm, S1, S2 normal, no murmur, click, rub or gallop Abdomen: soft, non-tender; bowel sounds normal; no masses,  no organomegaly Extremities: extremities normal, atraumatic, no cyanosis or edema Pulses: 2+ and symmetric Skin: Skin color, texture, turgor normal. No rashes or lesions Neurologic: Grossly normal    Labs on Admission:  Recent Labs  04/24/16 2225  NA 138  K 3.9  CL 101  CO2 29  GLUCOSE 147*  BUN 22*  CREATININE 1.39*  CALCIUM 8.7*    Recent Labs  04/24/16 2225  WBC 7.6  NEUTROABS 4.3  HGB 13.0  HCT 41.2  MCV 96.7  PLT 181    Recent Labs  04/24/16 2225  TROPONINI <0.03   Radiological Exams on Admission: Dg Chest Port 1 View  04/24/2016  CLINICAL DATA:  Initial valuation for acute worsening shortness of breath. EXAM: PORTABLE CHEST 1 VIEW COMPARISON:  Prior radiograph from 01/24/2016. FINDINGS: Cardiomegaly is stable from prior. Mediastinal silhouettes within normal limits. Diffuse vascular congestion with interstitial prominence, suggestive of mild pulmonary interstitial edema. No definite pleural effusion. Deformity at the left lower ribs with elevation of the lateral left hemidiaphragm is stable. No focal infiltrates. No pneumothorax. No acute osseous abnormality. IMPRESSION: Stable cardiomegaly with findings suggestive of mild diffuse interstitial edema. Electronically Signed   By: Jeannine Boga M.D.   On: 04/24/2016 23:37    Assessment/Plan  82 yo female with acute on chronic respiratory failure secondary to copde  Principal Problem:   Acute on chronic respiratory failure with hypoxia ([place])- due to copde.  Treatment as below.  Active Problems:   COPD with acute exacerbation ([place])- iv solumedrol,  freq nebs.  Zpack.     Adenocarcinoma of lung ([place])-   CKD (chronic kidney disease) stage 3, GFR 30-59 ml/min   Essential hypertension   Type II diabetes mellitus ([place])-  ssi   CHF (congestive heart failure) ([place])-  Not fluid overloaded, compensated at this time  obs on tele.  Full code but only for short period of time if there is a chance of meaningful recovery.  Sharnita Bogucki A 04/25/2016, 12:49 AM

## 2016-04-25 NOTE — Progress Notes (Signed)
PHARMACIST - PHYSICIAN COMMUNICATION  CONCERNING: P&T Medication Policy Regarding Oral Bisphosphonates  RECOMMENDATION: Your order for alendronate (Fosamax), ibandronate (Boniva), or risedronate (Actonel) has been discontinued at this time.  If the patient's post-hospital medical condition warrants safe use of this class of drugs, please resume the pre-hospital regimen upon discharge.  DESCRIPTION:  Alendronate (Fosamax), ibandronate (Boniva), and risedronate (Actonel) can cause severe esophageal erosions in patients who are unable to remain upright at least 30 minutes after taking this medication.   Since brief interruptions in therapy are thought to have minimal impact on bone mineral density, the [facility] has established that bisphosphonate orders should be routinely discontinued during hospitalization.   To override this safety policy and permit administration of Boniva, Fosamax, or Actonel in the hospital, prescribers must write "DO NOT HOLD" in the comments section when placing the order for this class of medications.  PHARMACIST - PHYSICIAN COMMUNICATION  CONCERNING: P&T Medication Policy Regarding Oral Bisphosphonates  RECOMMENDATION: Your order for alendronate (Fosamax), ibandronate (Boniva), or risedronate (Actonel) has been discontinued at this time.  If the patient's post-hospital medical condition warrants safe use of this class of drugs, please resume the pre-hospital regimen upon discharge.  DESCRIPTION:  Alendronate (Fosamax), ibandronate (Boniva), and risedronate (Actonel) can cause severe esophageal erosions in patients who are unable to remain upright at least 30 minutes after taking this medication.   Since brief interruptions in therapy are thought to have minimal impact on bone mineral density, the [facility] has established that bisphosphonate orders should be routinely discontinued during hospitalization.    To override this safety policy and permit administration of Boniva, Fosamax, or Actonel in the hospital, prescribers must write "DO NOT HOLD" in the comments section when placing the order for this class of medications.

## 2016-04-25 NOTE — Evaluation (Signed)
Physical Therapy Evaluation Patient Details Name: Angela Lawson MRN: [id number] DOB: 1934/07/28 Today's Date: 04/25/2016   History of Present Illness  82 yo F admitted with SOB and wheezing.  DX: Acute on chronic respiratory failure with hypoxia.  PMH: HTN, CAD, CHF, arthritis, diverticulitis, DM2, COPD, ventricular hernia with repair, cardiomyopathy, Home O2, lung CA surgery.   Clinical Impression  Pt received in bed, dtr present, and pt is agreeable to PT evaluation.  Pt expressed that she lives with her dtr in a 1 level home.  She is normally independent with ADL's, and doesn't use any DME for ambulation. She uses 3 L of O2 at home all the time.  Today, pt demonstrated modified independence with bed mobility, transfers, and supervision for gait x 38f with RW and 3L O2.  Pt is visibly fatigued, and demonstrates SOB with wheezing, and required 1 standing rest break with SpO2 WNL.  Pt may benefit from some HHPT to continue progression of energy conservation, and strengthening upon discharge.     Follow Up Recommendations Home health PT    Equipment Recommendations  None recommended by PT    Recommendations for Other Services       Precautions / Restrictions Restrictions Weight Bearing Restrictions: No      Mobility  Bed Mobility Overal bed mobility: Modified Independent Bed Mobility: Supine to Sit     Supine to sit: HOB elevated     General bed mobility comments: Pt states that she normally sleeps on 3 pillows  Transfers Overall transfer level: Modified independent Equipment used: Rolling walker (2 wheeled)                Ambulation/Gait Ambulation/Gait assistance: Supervision Ambulation Distance (Feet): 40 Feet Assistive device: Rolling walker (2 wheeled) Gait Pattern/deviations: Step-through pattern     General Gait Details: Pt demonstrates significant increase in work of breathing with audible wheezing during ambulation, however SpO2 remained 93% on 3L.  Pt  needed 1 standing recovery period due to fatigue.   Stairs            Wheelchair Mobility    Modified Rankin (Stroke Patients Only)       Balance Overall balance assessment: No apparent balance deficits (not formally assessed)                                           Pertinent Vitals/Pain Pain Assessment: No/denies pain    Home Living Family/patient expects to be discharged to:: Private residence Living Arrangements: Children (Dtr)   Type of Home: House Home Access: Ramped entrance     Home Layout: One level Home Equipment: WEnvironmental consultant- 4 wheels;Cane - single point      Prior Function     Gait / Transfers Assistance Needed: Pt states that she ambulates with her rollator, and dtr corrects her, stating that she doesn't use it.    ADL's / Homemaking Assistance Needed: Pt is independent with dressing and bathing, however dtr runs all errands and grocery shopping.  Pt no longer drives.         Hand Dominance   Dominant Hand: Right    Extremity/Trunk Assessment   Upper Extremity Assessment: Overall WFL for tasks assessed           Lower Extremity Assessment: Overall WFL for tasks assessed         Communication   Communication: No difficulties  Cognition Arousal/Alertness: Awake/alert Behavior During Therapy: WFL for tasks assessed/performed Overall Cognitive Status: Within Functional Limits for tasks assessed                      General Comments      Exercises        Assessment/Plan    PT Assessment Patient needs continued PT services  PT Diagnosis Difficulty walking   PT Problem List Decreased strength;Decreased activity tolerance;Decreased balance;Decreased mobility;Decreased knowledge of precautions;Cardiopulmonary status limiting activity;Decreased safety awareness  PT Treatment Interventions DME instruction;Gait training;Functional mobility training;Therapeutic activities;Therapeutic exercise;Patient/family  education   PT Goals (Current goals can be found in the Care Plan section) Acute Rehab PT Goals Patient Stated Goal: Pt wants to get stronger and have more endurance.  PT Goal Formulation: With patient/family Time For Goal Achievement: 05/02/16 Potential to Achieve Goals: Fair    Frequency Min 3X/week   Barriers to discharge        Co-evaluation               End of Session Equipment Utilized During Treatment: Gait belt;Oxygen Activity Tolerance: Patient tolerated treatment well Patient left: in bed;with call bell/phone within reach;with family/visitor present      Functional Assessment Tool Used: [place] "6-clicks."  Functional Limitation: Mobility: Walking and moving around Mobility: Walking and Moving Around Current Status [phone]): At least 20 percent but less than 40 percent impaired, limited or restricted Mobility: Walking and Moving Around Goal Status [phone]): At least 1 percent but less than 20 percent impaired, limited or restricted    Time: 9030-0923 PT Time Calculation (min) (ACUTE ONLY): 16 min   Charges:   PT Evaluation $PT Eval Low Complexity: 1 Procedure     PT G Codes:   PT G-Codes **NOT FOR INPATIENT CLASS** Functional Assessment Tool Used: [employer] "6-clicks."  Functional Limitation: Mobility: Walking and moving around Mobility: Walking and Moving Around Current Status [phone]): At least 20 percent but less than 40 percent impaired, limited or restricted Mobility: Walking and Moving Around Goal Status [phone]): At least 1 percent but less than 20 percent impaired, limited or restricted    Tacy Learn, PT, DPT X: [phone]   04/25/2016, 3:57 PM

## 2016-04-25 NOTE — Patient Outreach (Signed)
[facility]) Care Management  04/25/2016  Angela Lawson 1934/06/16 [id number]   Patient noted to be hospitalized on 04-24-16.   Plan: Will send order for community for transition of care.  Will send physician a letter notifying of discipline closure.    [name], RN, MSN [place] [phone]

## 2016-04-25 NOTE — Progress Notes (Signed)
Pharmacy Antibiotic Note  Angela Lawson is a 82 y.o. female admitted on 04/24/2016 with aspiration pneumonia.  Pharmacy has been consulted for ZOSYN dosing.  Plan: Zosyn 3.375g IV q8h (4 hour infusion).  Monitor labs, renal fxn, progress and c/s  Height: '5\' 4"'$  (162.6 cm) Weight: 174 lb 8 oz (79.153 kg) IBW/kg (Calculated) : 54.7  Temp (24hrs), Avg:97.9 F (36.6 C), Min:97.6 F (36.4 C), Max:98.1 F (36.7 C)   Recent Labs Lab 04/24/16 2225 04/25/16 0634  WBC 7.6 8.7  CREATININE 1.39* 1.41*    Estimated Creatinine Clearance: 32.4 mL/min (by C-G formula based on Cr of 1.41).    Allergies  Allergen Reactions  . Lisinopril Cough   Anti-infectives    Start     Dose/Rate Route Frequency Ordered Stop   04/26/16 1000  azithromycin (ZITHROMAX) tablet 250 mg  Status:  Discontinued     250 mg Oral Daily 04/25/16 0229 04/25/16 0902   04/25/16 1000  azithromycin (ZITHROMAX) tablet 500 mg  Status:  Discontinued     500 mg Oral Daily 04/25/16 0229 04/25/16 0902   04/25/16 1000  piperacillin-tazobactam (ZOSYN) IVPB 3.375 g     3.375 g 12.5 mL/hr over 240 Minutes Intravenous Every 8 hours 04/25/16 0914       No results found for this or any previous visit (from the past 240 hour(s)).  Thank you for allowing pharmacy to be a part of this patient's care.  Hart Robinsons A 04/25/2016 9:57 AM

## 2016-04-25 NOTE — ED Notes (Signed)
Dr Mcmanus at bedside,  

## 2016-04-25 NOTE — Consult Note (Signed)
Consult requested by: Dr. Legrand Rams Consult requested for acute on chronic respiratory failure:  HPI: This is an 82 year old who has a known history of COPD previous history of lung cancer and who has been having increasing shortness of breath at home. She came to the emergency department was treated but not able to be cleared and was admitted. She has been continuing to have significant shortness of breath and consultation was requested. When I went in to see her she was swallowing some food and clearly aspirated. She had significant cough congestion and increasing problems with shortness of breath. I requested a stat breathing treatment. She will have another chest x-ray.  Past Medical History  Diagnosis Date  . Essential hypertension   . Arthritis   . Diverticulitis   . Type II diabetes mellitus ([place])   . H/O ventral hernia   . COPD (chronic obstructive pulmonary disease) ([place])   . Osteoporosis   . Adenocarcinoma of lung ([place])     Left lung 2009, resected  . Non-obstructive CAD     a. 04/2015 NSTEMI/Cath: LAD 10p, LCX 40m RCA 379m20d, EF 35-40 w/ apical ballooning.  . Takotsubo cardiomyopathy     a. 04/2015 Echo: EF 45-50%, mid-dist anterior/apical/inferoapical HK w/ hyperdynamic base. Gr 1 DD, mild AI, mild-mod MR, triv TR, PASP 4818m;  b. 04/2015 LV gram: Ef 35-40% w/ apical ballooning.  . DMarland Kitchenslipidemia   . Ventricular bigeminy     a. 04/2015 in setting of NSTEMI/Takotsubo.  . CH[place] . Osteoporosis 11/04/2015    Managed by Dr. FanLegrand Rams. On home O2     3L N/C     Family History  Problem Relation Age of Onset  . Diabetes Mother   . Hypertension Mother   . Diabetes Father   . Asthma    . Cancer    . Colon cancer Neg Hx      Social History   Social History  . Marital Status: Widowed    Spouse Name: N/A  . Number of Children: N/A  . Years of Education: N/A   Social History Main Topics  . Smoking status: Former Smoker -- 20 years    Types:  Cigarettes    Quit date: 08/13/2006  . Smokeless tobacco: Never Used  . Alcohol Use: No  . Drug Use: No  . Sexual Activity: Not Currently    Birth Control/ Protection: Surgical   Other Topics Concern  . None   Social History Narrative     ROS: She can't tell me too much right now but she has a previous history of heart failure. She's not had a lot of swelling of her legs. She's not had chest pain. She says she does get choked occasionally. Otherwise per the history and physical which I reviewed    Objective: Vital signs in last 24 hours: Temp:  [97.6 F (36.4 C)-98.1 F (36.7 C)] 98.1 F (36.7 C) (05/19 0850) Pulse Rate:  [71-86] 81 (05/19 0850) Resp:  [15-18] 16 (05/19 0850) BP: (106-129)/(41-112) 124/62 mmHg (05/19 0850) SpO2:  [93 %-100 %] 96 % (05/19 0850) FiO2 (%):  [0 %] 0 % (05/19 0230) Weight:  [79.153 kg (174 lb 8 oz)] 79.153 kg (174 lb 8 oz) (05/19 0227) Weight change:  Last BM Date: 04/24/16  Intake/Output from previous day:    PHYSICAL EXAM She looks acutely short of breath. She is wheezing. She's coughing. Her pupils are reactive nose and throat are clear his  membranes are moist her chest shows marked bilateral wheezes. Her heart is regular without gallop. Abdomen is soft without masses. She doesn't have any significant edema. Central nervous system exam shows that she is very anxious about this episode  Lab Results: Basic Metabolic Panel:  Recent Labs  04/24/16 2225 04/25/16 0634  NA 138 136  K 3.9 4.1  CL 101 102  CO2 29 24  GLUCOSE 147* 377*  BUN 22* 23*  CREATININE 1.39* 1.41*  CALCIUM 8.7* 8.8*   Liver Function Tests: No results for input(s): AST, ALT, ALKPHOS, BILITOT, PROT, ALBUMIN in the last 72 hours. No results for input(s): LIPASE, AMYLASE in the last 72 hours. No results for input(s): AMMONIA in the last 72 hours. CBC:  Recent Labs  04/24/16 2225 04/25/16 0634  WBC 7.6 8.7  NEUTROABS 4.3  --   HGB 13.0 12.5  HCT 41.2 39.7   MCV 96.7 96.4  PLT 181 211   Cardiac Enzymes:  Recent Labs  04/24/16 2225  TROPONINI <0.03   BNP: No results for input(s): PROBNP in the last 72 hours. D-Dimer: No results for input(s): DDIMER in the last 72 hours. CBG: No results for input(s): GLUCAP in the last 72 hours. Hemoglobin A1C: No results for input(s): HGBA1C in the last 72 hours. Fasting Lipid Panel: No results for input(s): CHOL, HDL, LDLCALC, TRIG, CHOLHDL, LDLDIRECT in the last 72 hours. Thyroid Function Tests: No results for input(s): TSH, T4TOTAL, FREET4, T3FREE, THYROIDAB in the last 72 hours. Anemia Panel: No results for input(s): VITAMINB12, FOLATE, FERRITIN, TIBC, IRON, RETICCTPCT in the last 72 hours. Coagulation: No results for input(s): LABPROT, INR in the last 72 hours. Urine Drug Screen: Drugs of Abuse  No results found for: LABOPIA, COCAINSCRNUR, LABBENZ, AMPHETMU, THCU, LABBARB  Alcohol Level: No results for input(s): ETH in the last 72 hours. Urinalysis: No results for input(s): COLORURINE, LABSPEC, PHURINE, GLUCOSEU, HGBUR, BILIRUBINUR, KETONESUR, PROTEINUR, UROBILINOGEN, NITRITE, LEUKOCYTESUR in the last 72 hours.  Invalid input(s): APPERANCEUR Misc. Labs:   ABGS: No results for input(s): PHART, PO2ART, TCO2, HCO3 in the last 72 hours.  Invalid input(s): PCO2   MICROBIOLOGY: No results found for this or any previous visit (from the past 240 hour(s)).  Studies/Results: Dg Chest Port 1 View  04/24/2016  CLINICAL DATA:  Initial valuation for acute worsening shortness of breath. EXAM: PORTABLE CHEST 1 VIEW COMPARISON:  Prior radiograph from 01/24/2016. FINDINGS: Cardiomegaly is stable from prior. Mediastinal silhouettes within normal limits. Diffuse vascular congestion with interstitial prominence, suggestive of mild pulmonary interstitial edema. No definite pleural effusion. Deformity at the left lower ribs with elevation of the lateral left hemidiaphragm is stable. No focal infiltrates.  No pneumothorax. No acute osseous abnormality. IMPRESSION: Stable cardiomegaly with findings suggestive of mild diffuse interstitial edema. Electronically Signed   By: Jeannine Boga M.D.   On: 04/24/2016 23:37    Medications:  Prior to Admission:  Prescriptions prior to admission  Medication Sig Dispense Refill Last Dose  . albuterol (PROAIR HFA) 108 (90 BASE) MCG/ACT inhaler Inhale 2 puffs into the lungs every 4 (four) hours as needed for wheezing or shortness of breath.   04/24/2016 at Unknown time  . alendronate (FOSAMAX) 70 MG tablet Take 70 mg by mouth once a week. On Fridays   04/18/2016 at Unknown time  . ALPRAZolam (XANAX) 0.25 MG tablet Take 0.25 mg by mouth at bedtime.   2 04/23/2016 at Unknown time  . aspirin EC 81 MG tablet Take 81 mg by mouth daily.  04/24/2016 at Unknown time  . atorvastatin (LIPITOR) 80 MG tablet Take 1 tablet by mouth every morning.   04/24/2016 at Unknown time  . benzonatate (TESSALON) 100 MG capsule Take 100 mg by mouth 3 (three) times daily as needed. For cough  12   . buPROPion (WELLBUTRIN SR) 150 MG 12 hr tablet Take 150 mg by mouth 2 (two) times daily.   04/24/2016 at Unknown time  . Cholecalciferol (VITAMIN D-3) 1000 UNITS CAPS Take 1 capsule by mouth daily.   04/24/2016 at Unknown time  . fish oil-omega-3 fatty acids 1000 MG capsule Take 1 g by mouth 2 (two) times daily.    04/24/2016 at Unknown time  . furosemide (LASIX) 40 MG tablet Take 1 tablet (40 mg total) by mouth daily. 30 tablet 3 04/24/2016 at Unknown time  . gabapentin (NEURONTIN) 300 MG capsule Take 300 mg by mouth daily.    04/24/2016 at Unknown time  . Garlic 220 MG TABS Take 300 mg by mouth daily.   04/24/2016 at Unknown time  . ipratropium-albuterol (DUONEB) 0.5-2.5 (3) MG/3ML SOLN Inhale 3 mLs into the lungs every 4 (four) hours as needed (wheezing/shortness of breath).    04/24/2016 at Unknown time  . linagliptin (TRADJENTA) 5 MG TABS tablet Take 5 mg by mouth daily.   04/24/2016 at Unknown  time  . losartan (COZAAR) 50 MG tablet Take 50 mg by mouth daily.  2 04/24/2016 at Unknown time  . meclizine (ANTIVERT) 25 MG tablet Take 1 tablet by mouth 3 (three) times daily as needed for dizziness.    04/24/2016 at Unknown time  . metoprolol succinate (TOPROL-XL) 25 MG 24 hr tablet Take 1 tablet (25 mg total) by mouth daily. 90 tablet 3 04/24/2016 at [place]  . nitroGLYCERIN (NITROSTAT) 0.4 MG SL tablet Place 0.4 mg under the tongue every 5 (five) minutes as needed for chest pain.   unknown  . omeprazole (PRILOSEC) 20 MG capsule Take 20 mg by mouth daily.   04/24/2016 at Unknown time  . OXYGEN Inhale 3 L into the lungs continuous.   04/24/2016 at Unknown time  . pyridoxine (B-6) 100 MG tablet Take 100 mg by mouth daily.   04/24/2016 at Unknown time  . rOPINIRole (REQUIP) 0.25 MG tablet Take 0.25 mg by mouth 3 (three) times daily.    04/24/2016 at Unknown time  . sodium chloride (OCEAN) 0.65 % SOLN nasal spray Place 1 spray into both nostrils as needed for congestion.   unknown   Scheduled: . ALPRAZolam  0.25 mg Oral QHS  . antiseptic oral rinse  7 mL Mouth Rinse BID  . aspirin EC  81 mg Oral Daily  . atorvastatin  80 mg Oral q morning - 10a  . buPROPion  150 mg Oral BID  . cholecalciferol  1,000 Units Oral Daily  . furosemide  40 mg Oral Daily  . gabapentin  300 mg Oral Daily  . guaiFENesin  600 mg Oral BID  . insulin aspart  0-15 Units Subcutaneous TID WC  . ipratropium-albuterol  2.5 mg Nebulization Q6H  . linagliptin  5 mg Oral Daily  . losartan  50 mg Oral Daily  . methylPREDNISolone (SOLU-MEDROL) injection  80 mg Intravenous Q6H  . metoprolol succinate  25 mg Oral Daily  . omega-3 acid ethyl esters  1 g Oral BID  . pyridoxine  100 mg Oral Daily  . rOPINIRole  0.25 mg Oral TID  . sodium chloride flush  3 mL Intravenous Q12H  . sodium  chloride flush  3 mL Intravenous Q12H   Continuous:  BPJ:PETKKO chloride, albuterol, meclizine, sodium chloride, sodium chloride flush  Assesment:  She has acute on chronic hypoxic respiratory failure. I think she is aspirated. She has multiple other medical problems as listed. She has COPD at baseline Principal Problem:   Acute on chronic respiratory failure with hypoxia (HCC) Active Problems:   Adenocarcinoma of lung (HCC)   CKD (chronic kidney disease) stage 3, GFR 30-59 ml/min   Essential hypertension   Type II diabetes mellitus (HCC)   CHF (congestive heart failure) ([place])   COPD with acute exacerbation ([place])    Plan: I discontinued Zithromax and start her on Zosyn. She'll have another chest x-ray. Increase her Solu-Medrol. Request speech consultation. Stat breathing treatment now. If she doesn't clear she may need higher level of care.      Shiri Hodapp L 04/25/2016, 9:02 AM

## 2016-04-25 NOTE — Consult Note (Signed)
   [facility] CM Inpatient Consult   04/25/2016  Angela Lawson 11/02/1934 [id number]   Notified of patient admission. Patient has been active with [facility] with [place] program. Chart reviewed. Will continue to follow along and outreach to patient at a more appropriate time to determine community care management needs. Active consent already on file for [place] [facility] services. Will make inpatient RNCM aware that patient is followed by [place] [facility] services.  Marthenia Rolling, MSN-Ed, RN,BSN [facility] Liaison [phone]

## 2016-04-25 NOTE — Progress Notes (Signed)
Subjective: Patient was admitted due to worsening shortness of breath. Patient has history of chronic respiratory failure on oxygen 3 liter by nasal canula. Patient has worsening of SOB and wheezing. No fever or chills.  Objective: Vital signs in last 24 hours: Temp:  [97.6 F (36.4 C)] 97.6 F (36.4 C) (05/19 0600) Pulse Rate:  [71-86] 86 (05/19 0100) Resp:  [15-18] 17 (05/19 0227) BP: (106-129)/(41-112) 120/48 mmHg (05/19 0227) SpO2:  [93 %-100 %] 94 % (05/19 0750) FiO2 (%):  [0 %] 0 % (05/19 0230) Weight:  [79.153 kg (174 lb 8 oz)] 79.153 kg (174 lb 8 oz) (05/19 0227) Weight change:  Last BM Date: 04/24/16  Intake/Output from previous day:    PHYSICAL EXAM General appearance: alert and no distress Resp: diminished breath sounds bilaterally and wheezes bilaterally Cardio: S1, S2 normal GI: soft, non-tender; bowel sounds normal; no masses,  no organomegaly Extremities: extremities normal, atraumatic, no cyanosis or edema  Lab Results:  Results for orders placed or performed during the hospital encounter of 04/24/16 (from the past 48 hour(s))  Basic metabolic panel     Status: Abnormal   Collection Time: 04/24/16 10:25 PM  Result Value Ref Range   Sodium 138 135 - 145 mmol/L   Potassium 3.9 3.5 - 5.1 mmol/L   Chloride 101 101 - 111 mmol/L   CO2 29 22 - 32 mmol/L   Glucose, Bld 147 (H) 65 - 99 mg/dL   BUN 22 (H) 6 - 20 mg/dL   Creatinine, Ser 1.39 (H) 0.44 - 1.00 mg/dL   Calcium 8.7 (L) 8.9 - 10.3 mg/dL   GFR calc non Af Amer 35 (L) >60 mL/min   GFR calc Af Amer 40 (L) >60 mL/min    Comment: (NOTE) The eGFR has been calculated using the CKD EPI equation. This calculation has not been validated in all clinical situations. eGFR's persistently <60 mL/min signify possible Chronic Kidney Disease.    Anion gap 8 5 - 15  Brain natriuretic peptide     Status: None   Collection Time: 04/24/16 10:25 PM  Result Value Ref Range   B Natriuretic Peptide 20.0 0.0 - 100.0 pg/mL   Troponin I     Status: None   Collection Time: 04/24/16 10:25 PM  Result Value Ref Range   Troponin I <0.03 <0.031 ng/mL    Comment:        NO INDICATION OF MYOCARDIAL INJURY.   CBC with Differential     Status: None   Collection Time: 04/24/16 10:25 PM  Result Value Ref Range   WBC 7.6 4.0 - 10.5 K/uL   RBC 4.26 3.87 - 5.11 MIL/uL   Hemoglobin 13.0 12.0 - 15.0 g/dL   HCT 41.2 36.0 - 46.0 %   MCV 96.7 78.0 - 100.0 fL   MCH 30.5 26.0 - 34.0 pg   MCHC 31.6 30.0 - 36.0 g/dL   RDW 13.9 11.5 - 15.5 %   Platelets 181 150 - 400 K/uL   Neutrophils Relative % 57 %   Neutro Abs 4.3 1.7 - 7.7 K/uL   Lymphocytes Relative 23 %   Lymphs Abs 1.8 0.7 - 4.0 K/uL   Monocytes Relative 12 %   Monocytes Absolute 0.9 0.1 - 1.0 K/uL   Eosinophils Relative 8 %   Eosinophils Absolute 0.6 0.0 - 0.7 K/uL   Basophils Relative 0 %   Basophils Absolute 0.0 0.0 - 0.1 K/uL  Basic metabolic panel     Status: Abnormal   Collection  Time: 04/25/16  6:34 AM  Result Value Ref Range   Sodium 136 135 - 145 mmol/L   Potassium 4.1 3.5 - 5.1 mmol/L   Chloride 102 101 - 111 mmol/L   CO2 24 22 - 32 mmol/L   Glucose, Bld 377 (H) 65 - 99 mg/dL   BUN 23 (H) 6 - 20 mg/dL   Creatinine, Ser 1.41 (H) 0.44 - 1.00 mg/dL   Calcium 8.8 (L) 8.9 - 10.3 mg/dL   GFR calc non Af Amer 34 (L) >60 mL/min   GFR calc Af Amer 40 (L) >60 mL/min    Comment: (NOTE) The eGFR has been calculated using the CKD EPI equation. This calculation has not been validated in all clinical situations. eGFR's persistently <60 mL/min signify possible Chronic Kidney Disease.    Anion gap 10 5 - 15  CBC     Status: None   Collection Time: 04/25/16  6:34 AM  Result Value Ref Range   WBC 8.7 4.0 - 10.5 K/uL   RBC 4.12 3.87 - 5.11 MIL/uL   Hemoglobin 12.5 12.0 - 15.0 g/dL   HCT 39.7 36.0 - 46.0 %   MCV 96.4 78.0 - 100.0 fL   MCH 30.3 26.0 - 34.0 pg   MCHC 31.5 30.0 - 36.0 g/dL   RDW 13.9 11.5 - 15.5 %   Platelets 211 150 - 400 K/uL     ABGS No results for input(s): PHART, PO2ART, TCO2, HCO3 in the last 72 hours.  Invalid input(s): PCO2 CULTURES No results found for this or any previous visit (from the past 240 hour(s)). Studies/Results: Dg Chest Port 1 View  04/24/2016  CLINICAL DATA:  Initial valuation for acute worsening shortness of breath. EXAM: PORTABLE CHEST 1 VIEW COMPARISON:  Prior radiograph from 01/24/2016. FINDINGS: Cardiomegaly is stable from prior. Mediastinal silhouettes within normal limits. Diffuse vascular congestion with interstitial prominence, suggestive of mild pulmonary interstitial edema. No definite pleural effusion. Deformity at the left lower ribs with elevation of the lateral left hemidiaphragm is stable. No focal infiltrates. No pneumothorax. No acute osseous abnormality. IMPRESSION: Stable cardiomegaly with findings suggestive of mild diffuse interstitial edema. Electronically Signed   By: Jeannine Boga M.D.   On: 04/24/2016 23:37    Medications: I have reviewed the patient's current medications.  Assesment:   Principal Problem:   Acute on chronic respiratory failure with hypoxia (HCC) Active Problems:   Adenocarcinoma of lung (HCC)   CKD (chronic kidney disease) stage 3, GFR 30-59 ml/min   Essential hypertension   Type II diabetes mellitus (HCC)   CHF (congestive heart failure) (HCC)   COPD with acute exacerbation ([place])    Plan:  Medications reviewed Will continue nebulizer treatment and oxygen  Continue IV steroid Pulmonary consult      Angela Lawson 04/25/2016, 8:12 AM

## 2016-04-25 NOTE — ED Notes (Signed)
Pt ambulatory to nursing desk with oxygen, pulse ox dropped to 83% on 3 lpm via [place], wheezing noted, pt assisted back to room, Dr Thurnell Garbe notified,

## 2016-04-26 LAB — CBC
HCT: 38.2 % (ref 36.0–46.0)
Hemoglobin: 12.1 g/dL (ref 12.0–15.0)
MCH: 30.1 pg (ref 26.0–34.0)
MCHC: 31.7 g/dL (ref 30.0–36.0)
MCV: 95 fL (ref 78.0–100.0)
Platelets: 198 10*3/uL (ref 150–400)
RBC: 4.02 MIL/uL (ref 3.87–5.11)
RDW: 14 % (ref 11.5–15.5)
WBC: 15.3 10*3/uL — ABNORMAL HIGH (ref 4.0–10.5)

## 2016-04-26 LAB — BASIC METABOLIC PANEL
Anion gap: 9 (ref 5–15)
BUN: 30 mg/dL — ABNORMAL HIGH (ref 6–20)
CO2: 24 mmol/L (ref 22–32)
Calcium: 9 mg/dL (ref 8.9–10.3)
Chloride: 101 mmol/L (ref 101–111)
Creatinine, Ser: 1.54 mg/dL — ABNORMAL HIGH (ref 0.44–1.00)
GFR calc Af Amer: 36 mL/min — ABNORMAL LOW (ref >60)
GFR calc non Af Amer: 31 mL/min — ABNORMAL LOW (ref >60)
Glucose, Bld: 284 mg/dL — ABNORMAL HIGH (ref 65–99)
Potassium: 4.2 mmol/L (ref 3.5–5.1)
Sodium: 134 mmol/L — ABNORMAL LOW (ref 135–145)

## 2016-04-26 LAB — GLUCOSE, CAPILLARY
Glucose-Capillary: 288 mg/dL — ABNORMAL HIGH (ref 65–99)
Glucose-Capillary: 242 mg/dL — ABNORMAL HIGH (ref 65–99)
Glucose-Capillary: 276 mg/dL — ABNORMAL HIGH (ref 65–99)
Glucose-Capillary: 329 mg/dL — ABNORMAL HIGH (ref 65–99)

## 2016-04-26 MED ORDER — INSULIN ASPART 100 UNIT/ML ~~LOC~~ SOLN
0.0000 [IU] | Freq: Every day | SUBCUTANEOUS | Status: DC
  Administered 2016-04-26: 4 [IU] via SUBCUTANEOUS
  Administered 2016-04-27: 3 [IU] via SUBCUTANEOUS

## 2016-04-26 MED ORDER — INSULIN ASPART 100 UNIT/ML ~~LOC~~ SOLN
0.0000 [IU] | Freq: Three times a day (TID) | SUBCUTANEOUS | Status: DC
  Administered 2016-04-27 (×2): 8 [IU] via SUBCUTANEOUS
  Administered 2016-04-27: 11 [IU] via SUBCUTANEOUS
  Administered 2016-04-28: 5 [IU] via SUBCUTANEOUS

## 2016-04-26 NOTE — Progress Notes (Signed)
Subjective: Patient feels better today. Her medications were adjusted by Dr.Hawkins. No fever or chills. Her cough and wheezing is subsiding.  Objective: Vital signs in last 24 hours: Temp:  [98 F (36.7 C)-98.2 F (36.8 C)] 98 F (36.7 C) (05/20 0359) Pulse Rate:  [84-102] 85 (05/20 0359) Resp:  [16-20] 20 (05/20 0359) BP: (126-139)/(49-62) 139/52 mmHg (05/20 0359) SpO2:  [92 %-96 %] 92 % (05/20 0750) Weight:  [82.283 kg (181 lb 6.4 oz)] 82.283 kg (181 lb 6.4 oz) (05/20 8828) Weight change: 3.13 kg (6 lb 14.4 oz) Last BM Date: 04/25/16  Intake/Output from previous day: 05/19 0701 - 05/20 0700 In: 480 [P.O.:480] Out: -   PHYSICAL EXAM General appearance: alert and no distress Resp: diminished breath sounds bilaterally and wheezes bilaterally Cardio: S1, S2 normal GI: soft, non-tender; bowel sounds normal; no masses,  no organomegaly Extremities: extremities normal, atraumatic, no cyanosis or edema  Lab Results:  Results for orders placed or performed during the hospital encounter of 04/24/16 (from the past 48 hour(s))  Basic metabolic panel     Status: Abnormal   Collection Time: 04/24/16 10:25 PM  Result Value Ref Range   Sodium 138 135 - 145 mmol/L   Potassium 3.9 3.5 - 5.1 mmol/L   Chloride 101 101 - 111 mmol/L   CO2 29 22 - 32 mmol/L   Glucose, Bld 147 (H) 65 - 99 mg/dL   BUN 22 (H) 6 - 20 mg/dL   Creatinine, Ser 1.39 (H) 0.44 - 1.00 mg/dL   Calcium 8.7 (L) 8.9 - 10.3 mg/dL   GFR calc non Af Amer 35 (L) >60 mL/min   GFR calc Af Amer 40 (L) >60 mL/min    Comment: (NOTE) The eGFR has been calculated using the CKD EPI equation. This calculation has not been validated in all clinical situations. eGFR's persistently <60 mL/min signify possible Chronic Kidney Disease.    Anion gap 8 5 - 15  Brain natriuretic peptide     Status: None   Collection Time: 04/24/16 10:25 PM  Result Value Ref Range   B Natriuretic Peptide 20.0 0.0 - 100.0 pg/mL  Troponin I     Status:  None   Collection Time: 04/24/16 10:25 PM  Result Value Ref Range   Troponin I <0.03 <0.031 ng/mL    Comment:        NO INDICATION OF MYOCARDIAL INJURY.   CBC with Differential     Status: None   Collection Time: 04/24/16 10:25 PM  Result Value Ref Range   WBC 7.6 4.0 - 10.5 K/uL   RBC 4.26 3.87 - 5.11 MIL/uL   Hemoglobin 13.0 12.0 - 15.0 g/dL   HCT 41.2 36.0 - 46.0 %   MCV 96.7 78.0 - 100.0 fL   MCH 30.5 26.0 - 34.0 pg   MCHC 31.6 30.0 - 36.0 g/dL   RDW 13.9 11.5 - 15.5 %   Platelets 181 150 - 400 K/uL   Neutrophils Relative % 57 %   Neutro Abs 4.3 1.7 - 7.7 K/uL   Lymphocytes Relative 23 %   Lymphs Abs 1.8 0.7 - 4.0 K/uL   Monocytes Relative 12 %   Monocytes Absolute 0.9 0.1 - 1.0 K/uL   Eosinophils Relative 8 %   Eosinophils Absolute 0.6 0.0 - 0.7 K/uL   Basophils Relative 0 %   Basophils Absolute 0.0 0.0 - 0.1 K/uL  Basic metabolic panel     Status: Abnormal   Collection Time: 04/25/16  6:34 AM  Result Value Ref Range   Sodium 136 135 - 145 mmol/L   Potassium 4.1 3.5 - 5.1 mmol/L   Chloride 102 101 - 111 mmol/L   CO2 24 22 - 32 mmol/L   Glucose, Bld 377 (H) 65 - 99 mg/dL   BUN 23 (H) 6 - 20 mg/dL   Creatinine, Ser 1.41 (H) 0.44 - 1.00 mg/dL   Calcium 8.8 (L) 8.9 - 10.3 mg/dL   GFR calc non Af Amer 34 (L) >60 mL/min   GFR calc Af Amer 40 (L) >60 mL/min    Comment: (NOTE) The eGFR has been calculated using the CKD EPI equation. This calculation has not been validated in all clinical situations. eGFR's persistently <60 mL/min signify possible Chronic Kidney Disease.    Anion gap 10 5 - 15  CBC     Status: None   Collection Time: 04/25/16  6:34 AM  Result Value Ref Range   WBC 8.7 4.0 - 10.5 K/uL   RBC 4.12 3.87 - 5.11 MIL/uL   Hemoglobin 12.5 12.0 - 15.0 g/dL   HCT 39.7 36.0 - 46.0 %   MCV 96.4 78.0 - 100.0 fL   MCH 30.3 26.0 - 34.0 pg   MCHC 31.5 30.0 - 36.0 g/dL   RDW 13.9 11.5 - 15.5 %   Platelets 211 150 - 400 K/uL  Glucose, capillary     Status:  Abnormal   Collection Time: 04/25/16 11:53 AM  Result Value Ref Range   Glucose-Capillary 294 (H) 65 - 99 mg/dL   Comment 1 Notify RN    Comment 2 Document in Chart   Glucose, capillary     Status: Abnormal   Collection Time: 04/25/16  4:39 PM  Result Value Ref Range   Glucose-Capillary 326 (H) 65 - 99 mg/dL   Comment 1 Notify RN    Comment 2 Document in Chart   Glucose, capillary     Status: Abnormal   Collection Time: 04/25/16  8:26 PM  Result Value Ref Range   Glucose-Capillary 264 (H) 65 - 99 mg/dL   Comment 1 Notify RN    Comment 2 Document in Chart   CBC     Status: Abnormal   Collection Time: 04/25/16 11:55 PM  Result Value Ref Range   WBC 15.3 (H) 4.0 - 10.5 K/uL   RBC 4.02 3.87 - 5.11 MIL/uL   Hemoglobin 12.1 12.0 - 15.0 g/dL   HCT 38.2 36.0 - 46.0 %   MCV 95.0 78.0 - 100.0 fL   MCH 30.1 26.0 - 34.0 pg   MCHC 31.7 30.0 - 36.0 g/dL   RDW 14.0 11.5 - 15.5 %   Platelets 198 150 - 400 K/uL  Basic metabolic panel     Status: Abnormal   Collection Time: 04/25/16 11:55 PM  Result Value Ref Range   Sodium 134 (L) 135 - 145 mmol/L   Potassium 4.2 3.5 - 5.1 mmol/L   Chloride 101 101 - 111 mmol/L   CO2 24 22 - 32 mmol/L   Glucose, Bld 284 (H) 65 - 99 mg/dL   BUN 30 (H) 6 - 20 mg/dL   Creatinine, Ser 1.54 (H) 0.44 - 1.00 mg/dL   Calcium 9.0 8.9 - 10.3 mg/dL   GFR calc non Af Amer 31 (L) >60 mL/min   GFR calc Af Amer 36 (L) >60 mL/min    Comment: (NOTE) The eGFR has been calculated using the CKD EPI equation. This calculation has not been validated in all clinical situations.  eGFR's persistently <60 mL/min signify possible Chronic Kidney Disease.    Anion gap 9 5 - 15  Glucose, capillary     Status: Abnormal   Collection Time: 04/26/16  7:46 AM  Result Value Ref Range   Glucose-Capillary 288 (H) 65 - 99 mg/dL   Comment 1 Notify RN    Comment 2 Document in Chart     ABGS No results for input(s): PHART, PO2ART, TCO2, HCO3 in the last 72 hours.  Invalid input(s):  PCO2 CULTURES No results found for this or any previous visit (from the past 240 hour(s)). Studies/Results: Dg Chest Port 1 View  04/25/2016  CLINICAL DATA:  Cough, possible aspiration EXAM: PORTABLE CHEST 1 VIEW COMPARISON:  04/24/2016 FINDINGS: Cardiomediastinal silhouette is stable. No pulmonary edema. There is right base medially atelectasis or early infiltrate. Stable atelectasis in left base. IMPRESSION: Right base medially atelectasis or early infiltrate. No pulmonary edema. Stable left basilar atelectasis. Electronically Signed   By: Lahoma Crocker M.D.   On: 04/25/2016 13:00   Dg Chest Port 1 View  04/24/2016  CLINICAL DATA:  Initial valuation for acute worsening shortness of breath. EXAM: PORTABLE CHEST 1 VIEW COMPARISON:  Prior radiograph from 01/24/2016. FINDINGS: Cardiomegaly is stable from prior. Mediastinal silhouettes within normal limits. Diffuse vascular congestion with interstitial prominence, suggestive of mild pulmonary interstitial edema. No definite pleural effusion. Deformity at the left lower ribs with elevation of the lateral left hemidiaphragm is stable. No focal infiltrates. No pneumothorax. No acute osseous abnormality. IMPRESSION: Stable cardiomegaly with findings suggestive of mild diffuse interstitial edema. Electronically Signed   By: Jeannine Boga M.D.   On: 04/24/2016 23:37    Medications: I have reviewed the patient's current medications.  Assesment:   Principal Problem:   Acute on chronic respiratory failure with hypoxia (HCC) Active Problems:   Adenocarcinoma of lung (HCC)   CKD (chronic kidney disease) stage 3, GFR 30-59 ml/min   Essential hypertension   Type II diabetes mellitus (HCC)   CHF (congestive heart failure) (HCC)   COPD with acute exacerbation (HCC)    Plan:  Medications reviewed Will continue nebulizer treatment and oxygen  Continue IV steroid Pulmonary consult appreciated    LOS: 1 day   Bionca Mckey 04/26/2016, 9:17 AM

## 2016-04-26 NOTE — Progress Notes (Signed)
Subjective: She feels better. She has much less cough and congestion. No other new complaints  Objective: Vital signs in last 24 hours: Temp:  [98 F (36.7 C)-98.2 F (36.8 C)] 98 F (36.7 C) (05/20 0359) Pulse Rate:  [84-102] 85 (05/20 0359) Resp:  [16-20] 20 (05/20 0359) BP: (126-139)/(49-62) 139/52 mmHg (05/20 0359) SpO2:  [92 %-96 %] 92 % (05/20 0750) Weight:  [82.283 kg (181 lb 6.4 oz)] 82.283 kg (181 lb 6.4 oz) (05/20 9892) Weight change: 3.13 kg (6 lb 14.4 oz) Last BM Date: 04/25/16  Intake/Output from previous day: 05/19 0701 - 05/20 0700 In: 480 [P.O.:480] Out: -   PHYSICAL EXAM General appearance: alert, cooperative and mild distress Resp: rhonchi bilaterally Cardio: regular rate and rhythm, S1, S2 normal, no murmur, click, rub or gallop GI: soft, non-tender; bowel sounds normal; no masses,  no organomegaly Extremities: extremities normal, atraumatic, no cyanosis or edema  Lab Results:  Results for orders placed or performed during the hospital encounter of 04/24/16 (from the past 48 hour(s))  Basic metabolic panel     Status: Abnormal   Collection Time: 04/24/16 10:25 PM  Result Value Ref Range   Sodium 138 135 - 145 mmol/L   Potassium 3.9 3.5 - 5.1 mmol/L   Chloride 101 101 - 111 mmol/L   CO2 29 22 - 32 mmol/L   Glucose, Bld 147 (H) 65 - 99 mg/dL   BUN 22 (H) 6 - 20 mg/dL   Creatinine, Ser 1.39 (H) 0.44 - 1.00 mg/dL   Calcium 8.7 (L) 8.9 - 10.3 mg/dL   GFR calc non Af Amer 35 (L) >60 mL/min   GFR calc Af Amer 40 (L) >60 mL/min    Comment: (NOTE) The eGFR has been calculated using the CKD EPI equation. This calculation has not been validated in all clinical situations. eGFR's persistently <60 mL/min signify possible Chronic Kidney Disease.    Anion gap 8 5 - 15  Brain natriuretic peptide     Status: None   Collection Time: 04/24/16 10:25 PM  Result Value Ref Range   B Natriuretic Peptide 20.0 0.0 - 100.0 pg/mL  Troponin I     Status: None    Collection Time: 04/24/16 10:25 PM  Result Value Ref Range   Troponin I <0.03 <0.031 ng/mL    Comment:        NO INDICATION OF MYOCARDIAL INJURY.   CBC with Differential     Status: None   Collection Time: 04/24/16 10:25 PM  Result Value Ref Range   WBC 7.6 4.0 - 10.5 K/uL   RBC 4.26 3.87 - 5.11 MIL/uL   Hemoglobin 13.0 12.0 - 15.0 g/dL   HCT 41.2 36.0 - 46.0 %   MCV 96.7 78.0 - 100.0 fL   MCH 30.5 26.0 - 34.0 pg   MCHC 31.6 30.0 - 36.0 g/dL   RDW 13.9 11.5 - 15.5 %   Platelets 181 150 - 400 K/uL   Neutrophils Relative % 57 %   Neutro Abs 4.3 1.7 - 7.7 K/uL   Lymphocytes Relative 23 %   Lymphs Abs 1.8 0.7 - 4.0 K/uL   Monocytes Relative 12 %   Monocytes Absolute 0.9 0.1 - 1.0 K/uL   Eosinophils Relative 8 %   Eosinophils Absolute 0.6 0.0 - 0.7 K/uL   Basophils Relative 0 %   Basophils Absolute 0.0 0.0 - 0.1 K/uL  Basic metabolic panel     Status: Abnormal   Collection Time: 04/25/16  6:34 AM  Result Value Ref Range   Sodium 136 135 - 145 mmol/L   Potassium 4.1 3.5 - 5.1 mmol/L   Chloride 102 101 - 111 mmol/L   CO2 24 22 - 32 mmol/L   Glucose, Bld 377 (H) 65 - 99 mg/dL   BUN 23 (H) 6 - 20 mg/dL   Creatinine, Ser 1.41 (H) 0.44 - 1.00 mg/dL   Calcium 8.8 (L) 8.9 - 10.3 mg/dL   GFR calc non Af Amer 34 (L) >60 mL/min   GFR calc Af Amer 40 (L) >60 mL/min    Comment: (NOTE) The eGFR has been calculated using the CKD EPI equation. This calculation has not been validated in all clinical situations. eGFR's persistently <60 mL/min signify possible Chronic Kidney Disease.    Anion gap 10 5 - 15  CBC     Status: None   Collection Time: 04/25/16  6:34 AM  Result Value Ref Range   WBC 8.7 4.0 - 10.5 K/uL   RBC 4.12 3.87 - 5.11 MIL/uL   Hemoglobin 12.5 12.0 - 15.0 g/dL   HCT 39.7 36.0 - 46.0 %   MCV 96.4 78.0 - 100.0 fL   MCH 30.3 26.0 - 34.0 pg   MCHC 31.5 30.0 - 36.0 g/dL   RDW 13.9 11.5 - 15.5 %   Platelets 211 150 - 400 K/uL  Glucose, capillary     Status: Abnormal    Collection Time: 04/25/16 11:53 AM  Result Value Ref Range   Glucose-Capillary 294 (H) 65 - 99 mg/dL   Comment 1 Notify RN    Comment 2 Document in Chart   Glucose, capillary     Status: Abnormal   Collection Time: 04/25/16  4:39 PM  Result Value Ref Range   Glucose-Capillary 326 (H) 65 - 99 mg/dL   Comment 1 Notify RN    Comment 2 Document in Chart   Glucose, capillary     Status: Abnormal   Collection Time: 04/25/16  8:26 PM  Result Value Ref Range   Glucose-Capillary 264 (H) 65 - 99 mg/dL   Comment 1 Notify RN    Comment 2 Document in Chart   CBC     Status: Abnormal   Collection Time: 04/25/16 11:55 PM  Result Value Ref Range   WBC 15.3 (H) 4.0 - 10.5 K/uL   RBC 4.02 3.87 - 5.11 MIL/uL   Hemoglobin 12.1 12.0 - 15.0 g/dL   HCT 38.2 36.0 - 46.0 %   MCV 95.0 78.0 - 100.0 fL   MCH 30.1 26.0 - 34.0 pg   MCHC 31.7 30.0 - 36.0 g/dL   RDW 14.0 11.5 - 15.5 %   Platelets 198 150 - 400 K/uL  Basic metabolic panel     Status: Abnormal   Collection Time: 04/25/16 11:55 PM  Result Value Ref Range   Sodium 134 (L) 135 - 145 mmol/L   Potassium 4.2 3.5 - 5.1 mmol/L   Chloride 101 101 - 111 mmol/L   CO2 24 22 - 32 mmol/L   Glucose, Bld 284 (H) 65 - 99 mg/dL   BUN 30 (H) 6 - 20 mg/dL   Creatinine, Ser 1.54 (H) 0.44 - 1.00 mg/dL   Calcium 9.0 8.9 - 10.3 mg/dL   GFR calc non Af Amer 31 (L) >60 mL/min   GFR calc Af Amer 36 (L) >60 mL/min    Comment: (NOTE) The eGFR has been calculated using the CKD EPI equation. This calculation has not been validated in all clinical situations.  eGFR's persistently <60 mL/min signify possible Chronic Kidney Disease.    Anion gap 9 5 - 15  Glucose, capillary     Status: Abnormal   Collection Time: 04/26/16  7:46 AM  Result Value Ref Range   Glucose-Capillary 288 (H) 65 - 99 mg/dL   Comment 1 Notify RN    Comment 2 Document in Chart     ABGS No results for input(s): PHART, PO2ART, TCO2, HCO3 in the last 72 hours.  Invalid input(s):  PCO2 CULTURES No results found for this or any previous visit (from the past 240 hour(s)). Studies/Results: Dg Chest Port 1 View  04/25/2016  CLINICAL DATA:  Cough, possible aspiration EXAM: PORTABLE CHEST 1 VIEW COMPARISON:  04/24/2016 FINDINGS: Cardiomediastinal silhouette is stable. No pulmonary edema. There is right base medially atelectasis or early infiltrate. Stable atelectasis in left base. IMPRESSION: Right base medially atelectasis or early infiltrate. No pulmonary edema. Stable left basilar atelectasis. Electronically Signed   By: Lahoma Crocker M.D.   On: 04/25/2016 13:00   Dg Chest Port 1 View  04/24/2016  CLINICAL DATA:  Initial valuation for acute worsening shortness of breath. EXAM: PORTABLE CHEST 1 VIEW COMPARISON:  Prior radiograph from 01/24/2016. FINDINGS: Cardiomegaly is stable from prior. Mediastinal silhouettes within normal limits. Diffuse vascular congestion with interstitial prominence, suggestive of mild pulmonary interstitial edema. No definite pleural effusion. Deformity at the left lower ribs with elevation of the lateral left hemidiaphragm is stable. No focal infiltrates. No pneumothorax. No acute osseous abnormality. IMPRESSION: Stable cardiomegaly with findings suggestive of mild diffuse interstitial edema. Electronically Signed   By: Jeannine Boga M.D.   On: 04/24/2016 23:37    Medications:  Prior to Admission:  Prescriptions prior to admission  Medication Sig Dispense Refill Last Dose  . albuterol (PROAIR HFA) 108 (90 BASE) MCG/ACT inhaler Inhale 2 puffs into the lungs every 4 (four) hours as needed for wheezing or shortness of breath.   04/24/2016 at Unknown time  . alendronate (FOSAMAX) 70 MG tablet Take 70 mg by mouth once a week. On Fridays   04/18/2016 at Unknown time  . ALPRAZolam (XANAX) 0.25 MG tablet Take 0.25 mg by mouth at bedtime.   2 04/23/2016 at Unknown time  . aspirin EC 81 MG tablet Take 81 mg by mouth daily.   04/24/2016 at Unknown time  .  atorvastatin (LIPITOR) 80 MG tablet Take 1 tablet by mouth every morning.   04/24/2016 at Unknown time  . benzonatate (TESSALON) 100 MG capsule Take 100 mg by mouth 3 (three) times daily as needed. For cough  12   . buPROPion (WELLBUTRIN SR) 150 MG 12 hr tablet Take 150 mg by mouth 2 (two) times daily.   04/24/2016 at Unknown time  . Cholecalciferol (VITAMIN D-3) 1000 UNITS CAPS Take 1 capsule by mouth daily.   04/24/2016 at Unknown time  . fish oil-omega-3 fatty acids 1000 MG capsule Take 1 g by mouth 2 (two) times daily.    04/24/2016 at Unknown time  . furosemide (LASIX) 40 MG tablet Take 1 tablet (40 mg total) by mouth daily. 30 tablet 3 04/24/2016 at Unknown time  . gabapentin (NEURONTIN) 300 MG capsule Take 300 mg by mouth daily.    04/24/2016 at Unknown time  . Garlic 233 MG TABS Take 300 mg by mouth daily.   04/24/2016 at Unknown time  . ipratropium-albuterol (DUONEB) 0.5-2.5 (3) MG/3ML SOLN Inhale 3 mLs into the lungs every 4 (four) hours as needed (wheezing/shortness of breath).  04/24/2016 at Unknown time  . linagliptin (TRADJENTA) 5 MG TABS tablet Take 5 mg by mouth daily.   04/24/2016 at Unknown time  . losartan (COZAAR) 50 MG tablet Take 50 mg by mouth daily.  2 04/24/2016 at Unknown time  . meclizine (ANTIVERT) 25 MG tablet Take 1 tablet by mouth 3 (three) times daily as needed for dizziness.    04/24/2016 at Unknown time  . metoprolol succinate (TOPROL-XL) 25 MG 24 hr tablet Take 1 tablet (25 mg total) by mouth daily. 90 tablet 3 04/24/2016 at [place]  . nitroGLYCERIN (NITROSTAT) 0.4 MG SL tablet Place 0.4 mg under the tongue every 5 (five) minutes as needed for chest pain.   unknown  . omeprazole (PRILOSEC) 20 MG capsule Take 20 mg by mouth daily.   04/24/2016 at Unknown time  . OXYGEN Inhale 3 L into the lungs continuous.   04/24/2016 at Unknown time  . pyridoxine (B-6) 100 MG tablet Take 100 mg by mouth daily.   04/24/2016 at Unknown time  . rOPINIRole (REQUIP) 0.25 MG tablet Take 0.25 mg by  mouth 3 (three) times daily.    04/24/2016 at Unknown time  . sodium chloride (OCEAN) 0.65 % SOLN nasal spray Place 1 spray into both nostrils as needed for congestion.   unknown   Scheduled: . ALPRAZolam  0.25 mg Oral QHS  . antiseptic oral rinse  7 mL Mouth Rinse BID  . aspirin EC  81 mg Oral Daily  . atorvastatin  80 mg Oral q morning - 10a  . buPROPion  150 mg Oral BID  . cholecalciferol  1,000 Units Oral Daily  . furosemide  40 mg Oral Daily  . gabapentin  300 mg Oral Daily  . guaiFENesin  600 mg Oral BID  . insulin aspart  0-15 Units Subcutaneous TID WC  . ipratropium-albuterol  3 mL Nebulization Q6H  . linagliptin  5 mg Oral Daily  . losartan  50 mg Oral Daily  . methylPREDNISolone (SOLU-MEDROL) injection  80 mg Intravenous Q6H  . metoprolol succinate  25 mg Oral Daily  . omega-3 acid ethyl esters  1 g Oral BID  . piperacillin-tazobactam (ZOSYN)  IV  3.375 g Intravenous Q8H  . pyridoxine  100 mg Oral Daily  . rOPINIRole  0.25 mg Oral TID  . sodium chloride flush  3 mL Intravenous Q12H  . sodium chloride flush  3 mL Intravenous Q12H   Continuous:  YYQ:MGNOIB chloride, albuterol, meclizine, sodium chloride, sodium chloride flush  Assesment: She was admitted with acute on chronic hypoxic respiratory failure. She has COPD exacerbation. I'm concerned that she may have aspirated. She looks better today. Principal Problem:   Acute on chronic respiratory failure with hypoxia (HCC) Active Problems:   Adenocarcinoma of lung (HCC)   CKD (chronic kidney disease) stage 3, GFR 30-59 ml/min   Essential hypertension   Type II diabetes mellitus (HCC)   CHF (congestive heart failure) ([place])   COPD with acute exacerbation ([place])    Plan: Continue current treatments. No changes today.    LOS: 1 day   Yolanda Huffstetler L 04/26/2016, 10:31 AM

## 2016-04-27 LAB — GLUCOSE, CAPILLARY
Glucose-Capillary: 274 mg/dL — ABNORMAL HIGH (ref 65–99)
Glucose-Capillary: 273 mg/dL — ABNORMAL HIGH (ref 65–99)
Glucose-Capillary: 313 mg/dL — ABNORMAL HIGH (ref 65–99)
Glucose-Capillary: 280 mg/dL — ABNORMAL HIGH (ref 65–99)

## 2016-04-27 MED ORDER — ONDANSETRON HCL 4 MG/2ML IJ SOLN
4.0000 mg | Freq: Four times a day (QID) | INTRAMUSCULAR | Status: DC | PRN
Start: 2016-04-27 — End: 2016-04-28
  Administered 2016-04-27: 4 mg via INTRAVENOUS
  Filled 2016-04-27: qty 2

## 2016-04-27 MED ORDER — METHYLPREDNISOLONE SODIUM SUCC 40 MG IJ SOLR
40.0000 mg | Freq: Two times a day (BID) | INTRAMUSCULAR | Status: DC
  Administered 2016-04-27 – 2016-04-28 (×2): 40 mg via INTRAVENOUS
  Filled 2016-04-27 (×2): qty 1

## 2016-04-27 NOTE — Progress Notes (Signed)
Subjective: Patient is progressively improving. Her breathing is better. No fever or chills. Objective: Vital signs in last 24 hours: Temp:  [98 F (36.7 C)-98.6 F (37 C)] 98.1 F (36.7 C) (05/21 0615) Pulse Rate:  [78-82] 79 (05/21 0615) Resp:  [20] 20 (05/21 0615) BP: (124-128)/(49-73) 125/73 mmHg (05/21 0615) SpO2:  [94 %-98 %] 96 % (05/21 0747) Weight:  [81.92 kg (180 lb 9.6 oz)] 81.92 kg (180 lb 9.6 oz) (05/21 0615) Weight change: -0.363 kg (-12.8 oz) Last BM Date: 04/25/16  Intake/Output from previous day: 05/20 0701 - 05/21 0700 In: 1020 [P.O.:720; IV Piggyback:300] Out: 1150 [Urine:1150]  PHYSICAL EXAM General appearance: alert and no distress Resp: diminished breath sounds bilaterally and wheezes bilaterally Cardio: S1, S2 normal GI: soft, non-tender; bowel sounds normal; no masses,  no organomegaly Extremities: extremities normal, atraumatic, no cyanosis or edema  Lab Results:  Results for orders placed or performed during the hospital encounter of 04/24/16 (from the past 48 hour(s))  Glucose, capillary     Status: Abnormal   Collection Time: 04/25/16 11:53 AM  Result Value Ref Range   Glucose-Capillary 294 (H) 65 - 99 mg/dL   Comment 1 Notify RN    Comment 2 Document in Chart   Glucose, capillary     Status: Abnormal   Collection Time: 04/25/16  4:39 PM  Result Value Ref Range   Glucose-Capillary 326 (H) 65 - 99 mg/dL   Comment 1 Notify RN    Comment 2 Document in Chart   Glucose, capillary     Status: Abnormal   Collection Time: 04/25/16  8:26 PM  Result Value Ref Range   Glucose-Capillary 264 (H) 65 - 99 mg/dL   Comment 1 Notify RN    Comment 2 Document in Chart   CBC     Status: Abnormal   Collection Time: 04/25/16 11:55 PM  Result Value Ref Range   WBC 15.3 (H) 4.0 - 10.5 K/uL   RBC 4.02 3.87 - 5.11 MIL/uL   Hemoglobin 12.1 12.0 - 15.0 g/dL   HCT 38.2 36.0 - 46.0 %   MCV 95.0 78.0 - 100.0 fL   MCH 30.1 26.0 - 34.0 pg   MCHC 31.7 30.0 - 36.0  g/dL   RDW 14.0 11.5 - 15.5 %   Platelets 198 150 - 400 K/uL  Basic metabolic panel     Status: Abnormal   Collection Time: 04/25/16 11:55 PM  Result Value Ref Range   Sodium 134 (L) 135 - 145 mmol/L   Potassium 4.2 3.5 - 5.1 mmol/L   Chloride 101 101 - 111 mmol/L   CO2 24 22 - 32 mmol/L   Glucose, Bld 284 (H) 65 - 99 mg/dL   BUN 30 (H) 6 - 20 mg/dL   Creatinine, Ser 1.54 (H) 0.44 - 1.00 mg/dL   Calcium 9.0 8.9 - 10.3 mg/dL   GFR calc non Af Amer 31 (L) >60 mL/min   GFR calc Af Amer 36 (L) >60 mL/min    Comment: (NOTE) The eGFR has been calculated using the CKD EPI equation. This calculation has not been validated in all clinical situations. eGFR's persistently <60 mL/min signify possible Chronic Kidney Disease.    Anion gap 9 5 - 15  Glucose, capillary     Status: Abnormal   Collection Time: 04/26/16  7:46 AM  Result Value Ref Range   Glucose-Capillary 288 (H) 65 - 99 mg/dL   Comment 1 Notify RN    Comment 2 Document in Chart  Glucose, capillary     Status: Abnormal   Collection Time: 04/26/16 12:04 PM  Result Value Ref Range   Glucose-Capillary 242 (H) 65 - 99 mg/dL   Comment 1 Notify RN    Comment 2 Document in Chart   Glucose, capillary     Status: Abnormal   Collection Time: 04/26/16  4:55 PM  Result Value Ref Range   Glucose-Capillary 276 (H) 65 - 99 mg/dL   Comment 1 Notify RN    Comment 2 Document in Chart   Glucose, capillary     Status: Abnormal   Collection Time: 04/26/16  8:50 PM  Result Value Ref Range   Glucose-Capillary 329 (H) 65 - 99 mg/dL   Comment 1 Notify RN    Comment 2 Document in Chart   Glucose, capillary     Status: Abnormal   Collection Time: 04/27/16  7:41 AM  Result Value Ref Range   Glucose-Capillary 274 (H) 65 - 99 mg/dL   Comment 1 Notify RN    Comment 2 Document in Chart     ABGS No results for input(s): PHART, PO2ART, TCO2, HCO3 in the last 72 hours.  Invalid input(s): PCO2 CULTURES No results found for this or any previous  visit (from the past 240 hour(s)). Studies/Results: Dg Chest Port 1 View  04/25/2016  CLINICAL DATA:  Cough, possible aspiration EXAM: PORTABLE CHEST 1 VIEW COMPARISON:  04/24/2016 FINDINGS: Cardiomediastinal silhouette is stable. No pulmonary edema. There is right base medially atelectasis or early infiltrate. Stable atelectasis in left base. IMPRESSION: Right base medially atelectasis or early infiltrate. No pulmonary edema. Stable left basilar atelectasis. Electronically Signed   By: Lahoma Crocker M.D.   On: 04/25/2016 13:00    Medications: I have reviewed the patient's current medications.  Assesment:   Principal Problem:   Acute on chronic respiratory failure with hypoxia (HCC) Active Problems:   Adenocarcinoma of lung (HCC)   CKD (chronic kidney disease) stage 3, GFR 30-59 ml/min   Essential hypertension   Type II diabetes mellitus (HCC)   CHF (congestive heart failure) (HCC)   COPD with acute exacerbation ([place])    Plan:  Medications reviewed Will continue nebulizer treatment and oxygen  Will taper IV antibiotics Pulmonary consult appreciated    LOS: 2 days   Carlos Heber 04/27/2016, 11:08 AM

## 2016-04-27 NOTE — Progress Notes (Signed)
Subjective: She looks substantially better today. She's not coughing. She is wheezing less. She has no new complaints  Objective: Vital signs in last 24 hours: Temp:  [98 F (36.7 C)-98.6 F (37 C)] 98.1 F (36.7 C) (05/21 0615) Pulse Rate:  [78-82] 79 (05/21 0615) Resp:  [20] 20 (05/21 0615) BP: (124-128)/(49-73) 125/73 mmHg (05/21 0615) SpO2:  [94 %-98 %] 96 % (05/21 0747) Weight:  [81.92 kg (180 lb 9.6 oz)] 81.92 kg (180 lb 9.6 oz) (05/21 0615) Weight change: -0.363 kg (-12.8 oz) Last BM Date: 04/25/16  Intake/Output from previous day: 05/20 0701 - 05/21 0700 In: 1020 [P.O.:720; IV Piggyback:300] Out: 1150 [Urine:1150]  PHYSICAL EXAM General appearance: alert, cooperative and no distress Resp: Her chest is much clearer. She doesn't have any wheezing now. She has prolonged expiration Cardio: regular rate and rhythm, S1, S2 normal, no murmur, click, rub or gallop GI: soft, non-tender; bowel sounds normal; no masses,  no organomegaly Extremities: extremities normal, atraumatic, no cyanosis or edema  Lab Results:  Results for orders placed or performed during the hospital encounter of 04/24/16 (from the past 48 hour(s))  Glucose, capillary     Status: Abnormal   Collection Time: 04/25/16 11:53 AM  Result Value Ref Range   Glucose-Capillary 294 (H) 65 - 99 mg/dL   Comment 1 Notify RN    Comment 2 Document in Chart   Glucose, capillary     Status: Abnormal   Collection Time: 04/25/16  4:39 PM  Result Value Ref Range   Glucose-Capillary 326 (H) 65 - 99 mg/dL   Comment 1 Notify RN    Comment 2 Document in Chart   Glucose, capillary     Status: Abnormal   Collection Time: 04/25/16  8:26 PM  Result Value Ref Range   Glucose-Capillary 264 (H) 65 - 99 mg/dL   Comment 1 Notify RN    Comment 2 Document in Chart   CBC     Status: Abnormal   Collection Time: 04/25/16 11:55 PM  Result Value Ref Range   WBC 15.3 (H) 4.0 - 10.5 K/uL   RBC 4.02 3.87 - 5.11 MIL/uL   Hemoglobin  12.1 12.0 - 15.0 g/dL   HCT 38.2 36.0 - 46.0 %   MCV 95.0 78.0 - 100.0 fL   MCH 30.1 26.0 - 34.0 pg   MCHC 31.7 30.0 - 36.0 g/dL   RDW 14.0 11.5 - 15.5 %   Platelets 198 150 - 400 K/uL  Basic metabolic panel     Status: Abnormal   Collection Time: 04/25/16 11:55 PM  Result Value Ref Range   Sodium 134 (L) 135 - 145 mmol/L   Potassium 4.2 3.5 - 5.1 mmol/L   Chloride 101 101 - 111 mmol/L   CO2 24 22 - 32 mmol/L   Glucose, Bld 284 (H) 65 - 99 mg/dL   BUN 30 (H) 6 - 20 mg/dL   Creatinine, Ser 1.54 (H) 0.44 - 1.00 mg/dL   Calcium 9.0 8.9 - 10.3 mg/dL   GFR calc non Af Amer 31 (L) >60 mL/min   GFR calc Af Amer 36 (L) >60 mL/min    Comment: (NOTE) The eGFR has been calculated using the CKD EPI equation. This calculation has not been validated in all clinical situations. eGFR's persistently <60 mL/min signify possible Chronic Kidney Disease.    Anion gap 9 5 - 15  Glucose, capillary     Status: Abnormal   Collection Time: 04/26/16  7:46 AM  Result Value  Ref Range   Glucose-Capillary 288 (H) 65 - 99 mg/dL   Comment 1 Notify RN    Comment 2 Document in Chart   Glucose, capillary     Status: Abnormal   Collection Time: 04/26/16 12:04 PM  Result Value Ref Range   Glucose-Capillary 242 (H) 65 - 99 mg/dL   Comment 1 Notify RN    Comment 2 Document in Chart   Glucose, capillary     Status: Abnormal   Collection Time: 04/26/16  4:55 PM  Result Value Ref Range   Glucose-Capillary 276 (H) 65 - 99 mg/dL   Comment 1 Notify RN    Comment 2 Document in Chart   Glucose, capillary     Status: Abnormal   Collection Time: 04/26/16  8:50 PM  Result Value Ref Range   Glucose-Capillary 329 (H) 65 - 99 mg/dL   Comment 1 Notify RN    Comment 2 Document in Chart   Glucose, capillary     Status: Abnormal   Collection Time: 04/27/16  7:41 AM  Result Value Ref Range   Glucose-Capillary 274 (H) 65 - 99 mg/dL   Comment 1 Notify RN    Comment 2 Document in Chart     ABGS No results for  input(s): PHART, PO2ART, TCO2, HCO3 in the last 72 hours.  Invalid input(s): PCO2 CULTURES No results found for this or any previous visit (from the past 240 hour(s)). Studies/Results: Dg Chest Port 1 View  04/25/2016  CLINICAL DATA:  Cough, possible aspiration EXAM: PORTABLE CHEST 1 VIEW COMPARISON:  04/24/2016 FINDINGS: Cardiomediastinal silhouette is stable. No pulmonary edema. There is right base medially atelectasis or early infiltrate. Stable atelectasis in left base. IMPRESSION: Right base medially atelectasis or early infiltrate. No pulmonary edema. Stable left basilar atelectasis. Electronically Signed   By: Lahoma Crocker M.D.   On: 04/25/2016 13:00    Medications:  Prior to Admission:  Prescriptions prior to admission  Medication Sig Dispense Refill Last Dose  . albuterol (PROAIR HFA) 108 (90 BASE) MCG/ACT inhaler Inhale 2 puffs into the lungs every 4 (four) hours as needed for wheezing or shortness of breath.   04/24/2016 at Unknown time  . alendronate (FOSAMAX) 70 MG tablet Take 70 mg by mouth once a week. On Fridays   04/18/2016 at Unknown time  . ALPRAZolam (XANAX) 0.25 MG tablet Take 0.25 mg by mouth at bedtime.   2 04/23/2016 at Unknown time  . aspirin EC 81 MG tablet Take 81 mg by mouth daily.   04/24/2016 at Unknown time  . atorvastatin (LIPITOR) 80 MG tablet Take 1 tablet by mouth every morning.   04/24/2016 at Unknown time  . benzonatate (TESSALON) 100 MG capsule Take 100 mg by mouth 3 (three) times daily as needed. For cough  12   . buPROPion (WELLBUTRIN SR) 150 MG 12 hr tablet Take 150 mg by mouth 2 (two) times daily.   04/24/2016 at Unknown time  . Cholecalciferol (VITAMIN D-3) 1000 UNITS CAPS Take 1 capsule by mouth daily.   04/24/2016 at Unknown time  . fish oil-omega-3 fatty acids 1000 MG capsule Take 1 g by mouth 2 (two) times daily.    04/24/2016 at Unknown time  . furosemide (LASIX) 40 MG tablet Take 1 tablet (40 mg total) by mouth daily. 30 tablet 3 04/24/2016 at Unknown time   . gabapentin (NEURONTIN) 300 MG capsule Take 300 mg by mouth daily.    04/24/2016 at Unknown time  . Garlic 712 MG TABS  Take 300 mg by mouth daily.   04/24/2016 at Unknown time  . ipratropium-albuterol (DUONEB) 0.5-2.5 (3) MG/3ML SOLN Inhale 3 mLs into the lungs every 4 (four) hours as needed (wheezing/shortness of breath).    04/24/2016 at Unknown time  . linagliptin (TRADJENTA) 5 MG TABS tablet Take 5 mg by mouth daily.   04/24/2016 at Unknown time  . losartan (COZAAR) 50 MG tablet Take 50 mg by mouth daily.  2 04/24/2016 at Unknown time  . meclizine (ANTIVERT) 25 MG tablet Take 1 tablet by mouth 3 (three) times daily as needed for dizziness.    04/24/2016 at Unknown time  . metoprolol succinate (TOPROL-XL) 25 MG 24 hr tablet Take 1 tablet (25 mg total) by mouth daily. 90 tablet 3 04/24/2016 at [place]  . nitroGLYCERIN (NITROSTAT) 0.4 MG SL tablet Place 0.4 mg under the tongue every 5 (five) minutes as needed for chest pain.   unknown  . omeprazole (PRILOSEC) 20 MG capsule Take 20 mg by mouth daily.   04/24/2016 at Unknown time  . OXYGEN Inhale 3 L into the lungs continuous.   04/24/2016 at Unknown time  . pyridoxine (B-6) 100 MG tablet Take 100 mg by mouth daily.   04/24/2016 at Unknown time  . rOPINIRole (REQUIP) 0.25 MG tablet Take 0.25 mg by mouth 3 (three) times daily.    04/24/2016 at Unknown time  . sodium chloride (OCEAN) 0.65 % SOLN nasal spray Place 1 spray into both nostrils as needed for congestion.   unknown   Scheduled: . ALPRAZolam  0.25 mg Oral QHS  . antiseptic oral rinse  7 mL Mouth Rinse BID  . aspirin EC  81 mg Oral Daily  . atorvastatin  80 mg Oral q morning - 10a  . buPROPion  150 mg Oral BID  . cholecalciferol  1,000 Units Oral Daily  . furosemide  40 mg Oral Daily  . gabapentin  300 mg Oral Daily  . guaiFENesin  600 mg Oral BID  . insulin aspart  0-15 Units Subcutaneous TID WC  . insulin aspart  0-5 Units Subcutaneous QHS  . ipratropium-albuterol  3 mL Nebulization Q6H  .  linagliptin  5 mg Oral Daily  . losartan  50 mg Oral Daily  . methylPREDNISolone (SOLU-MEDROL) injection  80 mg Intravenous Q6H  . metoprolol succinate  25 mg Oral Daily  . omega-3 acid ethyl esters  1 g Oral BID  . piperacillin-tazobactam (ZOSYN)  IV  3.375 g Intravenous Q8H  . pyridoxine  100 mg Oral Daily  . rOPINIRole  0.25 mg Oral TID  . sodium chloride flush  3 mL Intravenous Q12H  . sodium chloride flush  3 mL Intravenous Q12H   Continuous:  EPP:IRJJOA chloride, albuterol, meclizine, sodium chloride, sodium chloride flush  Assesment: She has acute on chronic hypoxic respiratory failure with COPD exacerbation. I think she probably aspirated. She is significantly better. Principal Problem:   Acute on chronic respiratory failure with hypoxia (HCC) Active Problems:   Adenocarcinoma of lung (HCC)   CKD (chronic kidney disease) stage 3, GFR 30-59 ml/min   Essential hypertension   Type II diabetes mellitus (HCC)   CHF (congestive heart failure) ([place])   COPD with acute exacerbation ([place])    Plan: Continue current antibiotics and steroids etc.    LOS: 2 days   Angela Lawson L 04/27/2016, 10:14 AM

## 2016-04-28 LAB — GLUCOSE, CAPILLARY
Glucose-Capillary: 232 mg/dL — ABNORMAL HIGH (ref 65–99)
Glucose-Capillary: 162 mg/dL — ABNORMAL HIGH (ref 65–99)

## 2016-04-28 MED ORDER — PREDNISONE 10 MG (21) PO TBPK: 10.0000 mg | ORAL_TABLET | Freq: Every day | ORAL | Status: DC

## 2016-04-28 MED ORDER — AMOXICILLIN-POT CLAVULANATE 500-125 MG PO TABS: 1.0000 | ORAL_TABLET | Freq: Three times a day (TID) | ORAL | Status: DC

## 2016-04-28 NOTE — Care Management Note (Signed)
Case Management Note  Patient Details  Name: Angela Lawson MRN: [id number] Date of Birth: 1934/08/08  Subjective/Objective:                  Pt is from home, admitted with resp failure. Pt has caregiver at the bedside. Pt is ind with ADL's at baseline and uses a walker. Pt has home O2 and neb PTA. PT has recommended HH PT and pt is agreeable. RN and PT ordered and pt has chosen [facility] from list of [facility] agencies. Pt aware HH has 48 hours to make their first visit. Romualdo Bolk, of [facility], made aware of DC plan and will obtain pt info from chart.   Action/Plan: Pt discharging today.   Expected Discharge Date:    04/28/2016              Expected Discharge Plan:  [place]  In-House Referral:  NA  Discharge planning Services  CM Consult  Post Acute Care Choice:  Home Health Choice offered to:  Patient  DME Arranged:    DME Agency:     HH Arranged:  RN, PT [place] Agency:  [place]  Status of Service:  Completed, signed off  Medicare Important Message Given:  Yes Date Medicare IM Given:    Medicare IM give by:    Date Additional Medicare IM Given:    Additional Medicare Important Message give by:     If discussed at [place] of Stay Meetings, dates discussed:    Additional Comments:  Sherald Barge, RN 04/28/2016, 10:39 AM

## 2016-04-28 NOTE — Care Management Important Message (Signed)
Important Message  Patient Details  Name: Angela Lawson MRN: [id number] Date of Birth: 11/27/1934   Medicare Important Message Given:  Yes    Raniya Golembeski, Chauncey [place], RN 04/28/2016, 9:08 AM

## 2016-04-28 NOTE — Progress Notes (Signed)
She is apparently being set up for discharge. She has no new complaints. She does look better. Her chest is clear today. I will plan to sign off at this point but will be glad to see her again in my office as needed. Thanks for allowing me to participate in her care

## 2016-04-29 ENCOUNTER — Other Ambulatory Visit: Payer: Self-pay | Admitting: *Deleted

## 2016-04-29 NOTE — Patient Outreach (Signed)
Initial transition of care call.  Discharged 04/28/16  Spoke with patient she agrees to transition of care program and home visit.  Patient reports she does feel better since coming home. Patient states she manages her own medications. Reports she has her medications and new prescriptions for antibiotic and prednisone. No regular medications were changed. She is on 3lpm of oxygen Patient states her daughter takes her to her MD appointments, she sees Dr. Legrand Rams for primary care. Patient sees Dr. Luan Pulling for pulmonary Patient needs post hospital follow up appointment, states she has to get daughter's work schedule and then plans to make appointment around that, plans to call tomorrow. Patient has not heard from home health yet, [facility] be [facility], RNCM gave number and requested she call RNCM or [facility] if she has not heard anything by tomorrow afternoon.  Plan to call again next week for transition of care and schedule a home visit.  Royetta Crochet. Laymond Purser, RN, BSN, [place] [phone]

## 2016-05-01 DIAGNOSIS — Z7951 Long term (current) use of inhaled steroids: Secondary | ICD-10-CM | POA: Diagnosis not present

## 2016-05-01 DIAGNOSIS — J441 Chronic obstructive pulmonary disease with (acute) exacerbation: Secondary | ICD-10-CM | POA: Diagnosis not present

## 2016-05-01 DIAGNOSIS — E1122 Type 2 diabetes mellitus with diabetic chronic kidney disease: Secondary | ICD-10-CM | POA: Diagnosis not present

## 2016-05-01 DIAGNOSIS — Z7982 Long term (current) use of aspirin: Secondary | ICD-10-CM | POA: Diagnosis not present

## 2016-05-01 DIAGNOSIS — I129 Hypertensive chronic kidney disease with stage 1 through stage 4 chronic kidney disease, or unspecified chronic kidney disease: Secondary | ICD-10-CM | POA: Diagnosis not present

## 2016-05-01 DIAGNOSIS — Z9981 Dependence on supplemental oxygen: Secondary | ICD-10-CM | POA: Diagnosis not present

## 2016-05-01 DIAGNOSIS — I509 Heart failure, unspecified: Secondary | ICD-10-CM | POA: Diagnosis not present

## 2016-05-01 DIAGNOSIS — N183 Chronic kidney disease, stage 3 (moderate): Secondary | ICD-10-CM | POA: Diagnosis not present

## 2016-05-01 DIAGNOSIS — C3490 Malignant neoplasm of unspecified part of unspecified bronchus or lung: Secondary | ICD-10-CM | POA: Diagnosis not present

## 2016-05-05 DIAGNOSIS — J441 Chronic obstructive pulmonary disease with (acute) exacerbation: Secondary | ICD-10-CM | POA: Diagnosis not present

## 2016-05-05 DIAGNOSIS — I129 Hypertensive chronic kidney disease with stage 1 through stage 4 chronic kidney disease, or unspecified chronic kidney disease: Secondary | ICD-10-CM | POA: Diagnosis not present

## 2016-05-05 DIAGNOSIS — C3490 Malignant neoplasm of unspecified part of unspecified bronchus or lung: Secondary | ICD-10-CM | POA: Diagnosis not present

## 2016-05-05 DIAGNOSIS — Z7951 Long term (current) use of inhaled steroids: Secondary | ICD-10-CM | POA: Diagnosis not present

## 2016-05-05 DIAGNOSIS — Z7982 Long term (current) use of aspirin: Secondary | ICD-10-CM | POA: Diagnosis not present

## 2016-05-05 DIAGNOSIS — E1122 Type 2 diabetes mellitus with diabetic chronic kidney disease: Secondary | ICD-10-CM | POA: Diagnosis not present

## 2016-05-05 DIAGNOSIS — Z9981 Dependence on supplemental oxygen: Secondary | ICD-10-CM | POA: Diagnosis not present

## 2016-05-05 DIAGNOSIS — N183 Chronic kidney disease, stage 3 (moderate): Secondary | ICD-10-CM | POA: Diagnosis not present

## 2016-05-05 DIAGNOSIS — I509 Heart failure, unspecified: Secondary | ICD-10-CM | POA: Diagnosis not present

## 2016-05-05 NOTE — Discharge Summary (Signed)
Physician Discharge Summary  Patient ID: Angela Lawson MRN: [id number] DOB/AGE: Apr 04, 1934 82 y.o. Primary [place], [place] Admit date: 04/24/2016 Discharge date:04/28/2016   Discharge Diagnoses:   Principal Problem:   Acute on chronic respiratory failure with hypoxia (HCC) Active Problems:   Adenocarcinoma of lung (HCC)   CKD (chronic kidney disease) stage 3, GFR 30-59 ml/min   Essential hypertension   Type II diabetes mellitus (HCC)   CHF (congestive heart failure) (HCC)   COPD with acute exacerbation (HCC)     Medication List    TAKE these medications        alendronate 70 MG tablet  Commonly known as:  FOSAMAX  Take 70 mg by mouth once a week. On Fridays     ALPRAZolam 0.25 MG tablet  Commonly known as:  XANAX  Take 0.25 mg by mouth at bedtime.     amoxicillin-clavulanate 500-125 MG tablet  Commonly known as:  AUGMENTIN  Take 1 tablet (500 mg total) by mouth 3 (three) times daily.     aspirin EC 81 MG tablet  Take 81 mg by mouth daily.     atorvastatin 80 MG tablet  Commonly known as:  LIPITOR  Take 1 tablet by mouth every morning.     benzonatate 100 MG capsule  Commonly known as:  TESSALON  Take 100 mg by mouth 3 (three) times daily as needed. For cough     buPROPion 150 MG 12 hr tablet  Commonly known as:  WELLBUTRIN SR  Take 150 mg by mouth 2 (two) times daily.     fish oil-omega-3 fatty acids 1000 MG capsule  Take 1 g by mouth 2 (two) times daily.     furosemide 40 MG tablet  Commonly known as:  LASIX  Take 1 tablet (40 mg total) by mouth daily.     gabapentin 300 MG capsule  Commonly known as:  NEURONTIN  Take 300 mg by mouth daily.     Garlic 235 MG Tabs  Take 300 mg by mouth daily.     ipratropium-albuterol 0.5-2.5 (3) MG/3ML Soln  Commonly known as:  DUONEB  Inhale 3 mLs into the lungs every 4 (four) hours as needed (wheezing/shortness of breath).     linagliptin 5 MG Tabs tablet  Commonly known as:  TRADJENTA  Take 5  mg by mouth daily.     losartan 50 MG tablet  Commonly known as:  COZAAR  Take 50 mg by mouth daily.     meclizine 25 MG tablet  Commonly known as:  ANTIVERT  Take 1 tablet by mouth 3 (three) times daily as needed for dizziness.     metoprolol succinate 25 MG 24 hr tablet  Commonly known as:  TOPROL-XL  Take 1 tablet (25 mg total) by mouth daily.     nitroGLYCERIN 0.4 MG SL tablet  Commonly known as:  NITROSTAT  Place 0.4 mg under the tongue every 5 (five) minutes as needed for chest pain.     omeprazole 20 MG capsule  Commonly known as:  PRILOSEC  Take 20 mg by mouth daily.     OXYGEN  Inhale 3 L into the lungs continuous.     predniSONE 10 MG (21) Tbpk tablet  Commonly known as:  STERAPRED UNI-PAK 21 TAB  Take 1 tablet (10 mg total) by mouth daily. 4 tab po daily for 3 days, then 3 tab po daily for 3  Days, 2 tab po daily for 3 days, 1 tab po daily  for 3 days.     PROAIR HFA 108 (90 Base) MCG/ACT inhaler  Generic drug:  albuterol  Inhale 2 puffs into the lungs every 4 (four) hours as needed for wheezing or shortness of breath.     pyridoxine 100 MG tablet  Commonly known as:  B-6  Take 100 mg by mouth daily.     rOPINIRole 0.25 MG tablet  Commonly known as:  REQUIP  Take 0.25 mg by mouth 3 (three) times daily.     sodium chloride 0.65 % Soln nasal spray  Commonly known as:  OCEAN  Place 1 spray into both nostrils as needed for congestion.     Vitamin D-3 1000 units Caps  Take 1 capsule by mouth daily.        Discharged Condition: improved    Consults: pulmonary  Significant Diagnostic Studies: Dg Chest Port 1 View  04/25/2016  CLINICAL DATA:  Cough, possible aspiration EXAM: PORTABLE CHEST 1 VIEW COMPARISON:  04/24/2016 FINDINGS: Cardiomediastinal silhouette is stable. No pulmonary edema. There is right base medially atelectasis or early infiltrate. Stable atelectasis in left base. IMPRESSION: Right base medially atelectasis or early infiltrate. No  pulmonary edema. Stable left basilar atelectasis. Electronically Signed   By: Lahoma Crocker M.D.   On: 04/25/2016 13:00   Dg Chest Port 1 View  04/24/2016  CLINICAL DATA:  Initial valuation for acute worsening shortness of breath. EXAM: PORTABLE CHEST 1 VIEW COMPARISON:  Prior radiograph from 01/24/2016. FINDINGS: Cardiomegaly is stable from prior. Mediastinal silhouettes within normal limits. Diffuse vascular congestion with interstitial prominence, suggestive of mild pulmonary interstitial edema. No definite pleural effusion. Deformity at the left lower ribs with elevation of the lateral left hemidiaphragm is stable. No focal infiltrates. No pneumothorax. No acute osseous abnormality. IMPRESSION: Stable cardiomegaly with findings suggestive of mild diffuse interstitial edema. Electronically Signed   By: Jeannine Boga M.D.   On: 04/24/2016 23:37    Lab Results: Basic Metabolic Panel: No results for input(s): NA, K, CL, CO2, GLUCOSE, BUN, CREATININE, CALCIUM, MG, PHOS in the last 72 hours. Liver Function Tests: No results for input(s): AST, ALT, ALKPHOS, BILITOT, PROT, ALBUMIN in the last 72 hours.   CBC: No results for input(s): WBC, NEUTROABS, HGB, HCT, MCV, PLT in the last 72 hours.  No results found for this or any previous visit (from the past 240 hour(s)).   Hospital Course:   This is an 82 years old female with history of multiple medical illnesses was admitted due to worsening shortness of breath due exacerbation of COPD. She was treated with IV steroid and nebulizer treatment. Pulmonary consult was done who assisted in her management. Over the hospital stay patient improved and discharge with oral steroid and home oxygen. She will be followed in out patient.  Discharge Exam: Blood pressure 149/61, pulse 78, temperature 97.4 F (36.3 C), temperature source Oral, resp. rate 20, height '5\' 4"'$  (1.626 m), weight 81.92 kg (180 lb 9.6 oz), SpO2 98 %.   Disposition:  home         Follow-up Information    Follow up with [facility], MD On 05/06/2016.   Specialty:  Internal Medicine   Why:  @ 9:00   Contact information:   [place] [address] [phone]       Follow up with [facility].   Contact information:   [address] [place] [address] [phone]       Signed: Rosita Fire   05/05/2016, 4:45  PM   

## 2016-05-06 ENCOUNTER — Other Ambulatory Visit: Payer: Self-pay | Admitting: *Deleted

## 2016-05-06 ENCOUNTER — Encounter: Payer: Self-pay | Admitting: *Deleted

## 2016-05-06 DIAGNOSIS — N183 Chronic kidney disease, stage 3 (moderate): Secondary | ICD-10-CM | POA: Diagnosis not present

## 2016-05-06 DIAGNOSIS — I10 Essential (primary) hypertension: Secondary | ICD-10-CM | POA: Diagnosis not present

## 2016-05-06 DIAGNOSIS — J449 Chronic obstructive pulmonary disease, unspecified: Secondary | ICD-10-CM | POA: Diagnosis not present

## 2016-05-06 DIAGNOSIS — E1165 Type 2 diabetes mellitus with hyperglycemia: Secondary | ICD-10-CM | POA: Diagnosis not present

## 2016-05-06 DIAGNOSIS — J9611 Chronic respiratory failure with hypoxia: Secondary | ICD-10-CM | POA: Diagnosis not present

## 2016-05-06 NOTE — Patient Outreach (Signed)
Transition of care call week #2 Patient had admission for COPD exacerbation, discharged 04/28/16  Call to patient home. Spoke with patient.  Patient reports that she saw the doctor today for post hospital follow up.  She states he gave her a good report and did not make any changes to her medications. Patient states she is feeling very well. She states that Home health has started. Patient agrees to call again next week and then make scheduled visit. Patient denies any new concerns this call.  Plan: Reminded patient to contact MD with any new breathing issues or increased symptoms Call patient again next week. Plan make appointment for home visit Continue in transition of care program. Royetta Crochet. Laymond Purser, RN, BSN, [place] [phone]

## 2016-05-07 ENCOUNTER — Ambulatory Visit: Payer: Medicare Other

## 2016-05-07 DIAGNOSIS — C3490 Malignant neoplasm of unspecified part of unspecified bronchus or lung: Secondary | ICD-10-CM | POA: Diagnosis not present

## 2016-05-07 DIAGNOSIS — Z7982 Long term (current) use of aspirin: Secondary | ICD-10-CM | POA: Diagnosis not present

## 2016-05-07 DIAGNOSIS — Z7951 Long term (current) use of inhaled steroids: Secondary | ICD-10-CM | POA: Diagnosis not present

## 2016-05-07 DIAGNOSIS — E1122 Type 2 diabetes mellitus with diabetic chronic kidney disease: Secondary | ICD-10-CM | POA: Diagnosis not present

## 2016-05-07 DIAGNOSIS — J441 Chronic obstructive pulmonary disease with (acute) exacerbation: Secondary | ICD-10-CM | POA: Diagnosis not present

## 2016-05-07 DIAGNOSIS — I129 Hypertensive chronic kidney disease with stage 1 through stage 4 chronic kidney disease, or unspecified chronic kidney disease: Secondary | ICD-10-CM | POA: Diagnosis not present

## 2016-05-07 DIAGNOSIS — Z9981 Dependence on supplemental oxygen: Secondary | ICD-10-CM | POA: Diagnosis not present

## 2016-05-07 DIAGNOSIS — I509 Heart failure, unspecified: Secondary | ICD-10-CM | POA: Diagnosis not present

## 2016-05-07 DIAGNOSIS — N183 Chronic kidney disease, stage 3 (moderate): Secondary | ICD-10-CM | POA: Diagnosis not present

## 2016-05-08 DIAGNOSIS — Z7951 Long term (current) use of inhaled steroids: Secondary | ICD-10-CM | POA: Diagnosis not present

## 2016-05-08 DIAGNOSIS — I509 Heart failure, unspecified: Secondary | ICD-10-CM | POA: Diagnosis not present

## 2016-05-08 DIAGNOSIS — Z9981 Dependence on supplemental oxygen: Secondary | ICD-10-CM | POA: Diagnosis not present

## 2016-05-08 DIAGNOSIS — C3490 Malignant neoplasm of unspecified part of unspecified bronchus or lung: Secondary | ICD-10-CM | POA: Diagnosis not present

## 2016-05-08 DIAGNOSIS — I129 Hypertensive chronic kidney disease with stage 1 through stage 4 chronic kidney disease, or unspecified chronic kidney disease: Secondary | ICD-10-CM | POA: Diagnosis not present

## 2016-05-08 DIAGNOSIS — Z7982 Long term (current) use of aspirin: Secondary | ICD-10-CM | POA: Diagnosis not present

## 2016-05-08 DIAGNOSIS — E1122 Type 2 diabetes mellitus with diabetic chronic kidney disease: Secondary | ICD-10-CM | POA: Diagnosis not present

## 2016-05-08 DIAGNOSIS — N183 Chronic kidney disease, stage 3 (moderate): Secondary | ICD-10-CM | POA: Diagnosis not present

## 2016-05-08 DIAGNOSIS — J441 Chronic obstructive pulmonary disease with (acute) exacerbation: Secondary | ICD-10-CM | POA: Diagnosis not present

## 2016-05-12 DIAGNOSIS — N183 Chronic kidney disease, stage 3 (moderate): Secondary | ICD-10-CM | POA: Diagnosis not present

## 2016-05-12 DIAGNOSIS — J441 Chronic obstructive pulmonary disease with (acute) exacerbation: Secondary | ICD-10-CM | POA: Diagnosis not present

## 2016-05-12 DIAGNOSIS — Z9981 Dependence on supplemental oxygen: Secondary | ICD-10-CM | POA: Diagnosis not present

## 2016-05-12 DIAGNOSIS — I129 Hypertensive chronic kidney disease with stage 1 through stage 4 chronic kidney disease, or unspecified chronic kidney disease: Secondary | ICD-10-CM | POA: Diagnosis not present

## 2016-05-12 DIAGNOSIS — I509 Heart failure, unspecified: Secondary | ICD-10-CM | POA: Diagnosis not present

## 2016-05-12 DIAGNOSIS — E1122 Type 2 diabetes mellitus with diabetic chronic kidney disease: Secondary | ICD-10-CM | POA: Diagnosis not present

## 2016-05-12 DIAGNOSIS — Z7982 Long term (current) use of aspirin: Secondary | ICD-10-CM | POA: Diagnosis not present

## 2016-05-12 DIAGNOSIS — Z7951 Long term (current) use of inhaled steroids: Secondary | ICD-10-CM | POA: Diagnosis not present

## 2016-05-12 DIAGNOSIS — C3490 Malignant neoplasm of unspecified part of unspecified bronchus or lung: Secondary | ICD-10-CM | POA: Diagnosis not present

## 2016-05-13 ENCOUNTER — Other Ambulatory Visit: Payer: Self-pay | Admitting: *Deleted

## 2016-05-13 DIAGNOSIS — Z7951 Long term (current) use of inhaled steroids: Secondary | ICD-10-CM | POA: Diagnosis not present

## 2016-05-13 DIAGNOSIS — I509 Heart failure, unspecified: Secondary | ICD-10-CM | POA: Diagnosis not present

## 2016-05-13 DIAGNOSIS — N183 Chronic kidney disease, stage 3 (moderate): Secondary | ICD-10-CM | POA: Diagnosis not present

## 2016-05-13 DIAGNOSIS — J441 Chronic obstructive pulmonary disease with (acute) exacerbation: Secondary | ICD-10-CM | POA: Diagnosis not present

## 2016-05-13 DIAGNOSIS — C3490 Malignant neoplasm of unspecified part of unspecified bronchus or lung: Secondary | ICD-10-CM | POA: Diagnosis not present

## 2016-05-13 DIAGNOSIS — I129 Hypertensive chronic kidney disease with stage 1 through stage 4 chronic kidney disease, or unspecified chronic kidney disease: Secondary | ICD-10-CM | POA: Diagnosis not present

## 2016-05-13 DIAGNOSIS — Z9981 Dependence on supplemental oxygen: Secondary | ICD-10-CM | POA: Diagnosis not present

## 2016-05-13 DIAGNOSIS — Z7982 Long term (current) use of aspirin: Secondary | ICD-10-CM | POA: Diagnosis not present

## 2016-05-13 DIAGNOSIS — E1122 Type 2 diabetes mellitus with diabetic chronic kidney disease: Secondary | ICD-10-CM | POA: Diagnosis not present

## 2016-05-13 NOTE — Patient Outreach (Signed)
Transition of care call week #3 Spoke with patient, she reports she is doing well today. She has seen her primary care, no new medications. Patient does not report any new concerns this call. She is getting Home care services.  Patient states she has a tele health scale from [employer] and she weighs everyday.  Weight is stable at 174#  RNCM reviewed [facility] program services again with patient, patient does not want visit at this time but agrees to phone calls. Will continue transition of care calls for 30 days and then assess for return to health coach.  Royetta Crochet. Laymond Purser, RN, BSN, [place] [phone]

## 2016-05-14 DIAGNOSIS — J441 Chronic obstructive pulmonary disease with (acute) exacerbation: Secondary | ICD-10-CM | POA: Diagnosis not present

## 2016-05-14 DIAGNOSIS — E1122 Type 2 diabetes mellitus with diabetic chronic kidney disease: Secondary | ICD-10-CM | POA: Diagnosis not present

## 2016-05-14 DIAGNOSIS — I509 Heart failure, unspecified: Secondary | ICD-10-CM | POA: Diagnosis not present

## 2016-05-14 DIAGNOSIS — Z7982 Long term (current) use of aspirin: Secondary | ICD-10-CM | POA: Diagnosis not present

## 2016-05-14 DIAGNOSIS — I129 Hypertensive chronic kidney disease with stage 1 through stage 4 chronic kidney disease, or unspecified chronic kidney disease: Secondary | ICD-10-CM | POA: Diagnosis not present

## 2016-05-14 DIAGNOSIS — C3490 Malignant neoplasm of unspecified part of unspecified bronchus or lung: Secondary | ICD-10-CM | POA: Diagnosis not present

## 2016-05-14 DIAGNOSIS — Z9981 Dependence on supplemental oxygen: Secondary | ICD-10-CM | POA: Diagnosis not present

## 2016-05-14 DIAGNOSIS — N183 Chronic kidney disease, stage 3 (moderate): Secondary | ICD-10-CM | POA: Diagnosis not present

## 2016-05-14 DIAGNOSIS — Z7951 Long term (current) use of inhaled steroids: Secondary | ICD-10-CM | POA: Diagnosis not present

## 2016-05-15 DIAGNOSIS — I509 Heart failure, unspecified: Secondary | ICD-10-CM | POA: Diagnosis not present

## 2016-05-15 DIAGNOSIS — Z7982 Long term (current) use of aspirin: Secondary | ICD-10-CM | POA: Diagnosis not present

## 2016-05-15 DIAGNOSIS — J441 Chronic obstructive pulmonary disease with (acute) exacerbation: Secondary | ICD-10-CM | POA: Diagnosis not present

## 2016-05-15 DIAGNOSIS — Z9981 Dependence on supplemental oxygen: Secondary | ICD-10-CM | POA: Diagnosis not present

## 2016-05-15 DIAGNOSIS — I129 Hypertensive chronic kidney disease with stage 1 through stage 4 chronic kidney disease, or unspecified chronic kidney disease: Secondary | ICD-10-CM | POA: Diagnosis not present

## 2016-05-15 DIAGNOSIS — J9621 Acute and chronic respiratory failure with hypoxia: Secondary | ICD-10-CM | POA: Diagnosis not present

## 2016-05-15 DIAGNOSIS — N183 Chronic kidney disease, stage 3 (moderate): Secondary | ICD-10-CM | POA: Diagnosis not present

## 2016-05-15 DIAGNOSIS — E1122 Type 2 diabetes mellitus with diabetic chronic kidney disease: Secondary | ICD-10-CM | POA: Diagnosis not present

## 2016-05-15 DIAGNOSIS — C3490 Malignant neoplasm of unspecified part of unspecified bronchus or lung: Secondary | ICD-10-CM | POA: Diagnosis not present

## 2016-05-15 DIAGNOSIS — Z7951 Long term (current) use of inhaled steroids: Secondary | ICD-10-CM | POA: Diagnosis not present

## 2016-05-18 ENCOUNTER — Emergency Department (HOSPITAL_COMMUNITY): Payer: Medicare Other

## 2016-05-18 ENCOUNTER — Encounter (HOSPITAL_COMMUNITY): Payer: Self-pay | Admitting: *Deleted

## 2016-05-18 ENCOUNTER — Emergency Department (HOSPITAL_COMMUNITY)
Admission: EM | Admit: 2016-05-18 | Discharge: 2016-05-18 | Disposition: A | Discharge status: Home / Self Care | Payer: Medicare Other | Attending: Emergency Medicine | Admitting: Emergency Medicine

## 2016-05-18 DIAGNOSIS — J449 Chronic obstructive pulmonary disease, unspecified: Secondary | ICD-10-CM | POA: Diagnosis not present

## 2016-05-18 DIAGNOSIS — Z7982 Long term (current) use of aspirin: Secondary | ICD-10-CM | POA: Insufficient documentation

## 2016-05-18 DIAGNOSIS — I509 Heart failure, unspecified: Secondary | ICD-10-CM

## 2016-05-18 DIAGNOSIS — E785 Hyperlipidemia, unspecified: Secondary | ICD-10-CM | POA: Diagnosis not present

## 2016-05-18 DIAGNOSIS — Z87891 Personal history of nicotine dependence: Secondary | ICD-10-CM | POA: Diagnosis not present

## 2016-05-18 DIAGNOSIS — G20 Parkinson's disease: Secondary | ICD-10-CM | POA: Insufficient documentation

## 2016-05-18 DIAGNOSIS — R222 Localized swelling, mass and lump, trunk: Secondary | ICD-10-CM | POA: Diagnosis not present

## 2016-05-18 DIAGNOSIS — I110 Hypertensive heart disease with heart failure: Secondary | ICD-10-CM | POA: Diagnosis not present

## 2016-05-18 DIAGNOSIS — Z79899 Other long term (current) drug therapy: Secondary | ICD-10-CM | POA: Diagnosis not present

## 2016-05-18 DIAGNOSIS — M1990 Unspecified osteoarthritis, unspecified site: Secondary | ICD-10-CM | POA: Insufficient documentation

## 2016-05-18 DIAGNOSIS — E119 Type 2 diabetes mellitus without complications: Secondary | ICD-10-CM | POA: Insufficient documentation

## 2016-05-18 DIAGNOSIS — R635 Abnormal weight gain: Secondary | ICD-10-CM | POA: Diagnosis not present

## 2016-05-18 HISTORY — DX: Parkinson's disease: G20

## 2016-05-18 HISTORY — DX: Parkinson's disease without dyskinesia, without mention of fluctuations: G20.A1

## 2016-05-18 LAB — COMPREHENSIVE METABOLIC PANEL
ALT: 21 U/L (ref 14–54)
AST: 20 U/L (ref 15–41)
Albumin: 3.5 g/dL (ref 3.5–5.0)
Alkaline Phosphatase: 48 U/L (ref 38–126)
Anion gap: 8 (ref 5–15)
BUN: 15 mg/dL (ref 6–20)
CO2: 31 mmol/L (ref 22–32)
Calcium: 9 mg/dL (ref 8.9–10.3)
Chloride: 99 mmol/L — ABNORMAL LOW (ref 101–111)
Creatinine, Ser: 2.18 mg/dL — ABNORMAL HIGH (ref 0.44–1.00)
GFR calc Af Amer: 23 mL/min — ABNORMAL LOW (ref >60)
GFR calc non Af Amer: 20 mL/min — ABNORMAL LOW (ref >60)
Glucose, Bld: 204 mg/dL — ABNORMAL HIGH (ref 65–99)
Potassium: 3.3 mmol/L — ABNORMAL LOW (ref 3.5–5.1)
Sodium: 138 mmol/L (ref 135–145)
Total Bilirubin: 0.3 mg/dL (ref 0.3–1.2)
Total Protein: 6.5 g/dL (ref 6.5–8.1)

## 2016-05-18 LAB — PROTIME-INR
INR: 1.05 (ref 0.00–1.49)
Prothrombin Time: 13.9 seconds (ref 11.6–15.2)

## 2016-05-18 LAB — TROPONIN I: Troponin I: 0.03 ng/mL (ref <0.031)

## 2016-05-18 LAB — CBC
HCT: 37.5 % (ref 36.0–46.0)
Hemoglobin: 12.3 g/dL (ref 12.0–15.0)
MCH: 30.9 pg (ref 26.0–34.0)
MCHC: 32.8 g/dL (ref 30.0–36.0)
MCV: 94.2 fL (ref 78.0–100.0)
Platelets: 153 10*3/uL (ref 150–400)
RBC: 3.98 MIL/uL (ref 3.87–5.11)
RDW: 13.7 % (ref 11.5–15.5)
WBC: 6.9 10*3/uL (ref 4.0–10.5)

## 2016-05-18 LAB — I-STAT TROPONIN, ED: Troponin i, poc: 0.01 ng/mL (ref 0.00–0.08)

## 2016-05-18 LAB — BRAIN NATRIURETIC PEPTIDE: B Natriuretic Peptide: 40 pg/mL (ref 0.0–100.0)

## 2016-05-18 MED ORDER — POTASSIUM CHLORIDE CRYS ER 20 MEQ PO TBCR
20.0000 meq | EXTENDED_RELEASE_TABLET | Freq: Once | ORAL | Status: AC
  Administered 2016-05-18: 20 meq via ORAL
  Filled 2016-05-18: qty 1

## 2016-05-18 MED ORDER — FUROSEMIDE 10 MG/ML IJ SOLN
40.0000 mg | Freq: Once | INTRAMUSCULAR | Status: AC
  Administered 2016-05-18: 40 mg via INTRAVENOUS
  Filled 2016-05-18: qty 4

## 2016-05-18 NOTE — ED Notes (Addendum)
Pt has CHF and she weighs daily on a scale that a nurse reads it. The pt reports she weighed 172lb yesterday and today she weighed 174lb and her nurse called and left a message while she was at church today and told her to come to the hospital. Pt denies any pain or SOB. Pt wears O2 at 3L via  at home, O2 sat 98% in triage. Pt reports weakness earlier today, but denies any at this time.

## 2016-05-18 NOTE — ED Provider Notes (Signed)
CSN: [id number]     Arrival date & time 05/18/16  1840 History   First MD Initiated Contact with Patient 05/18/16 1856     Chief Complaint  Patient presents with  . Congestive Heart Failure     (Consider location/radiation/quality/duration/timing/severity/associated sxs/prior Treatment) HPI This is a 82 year old female with a history of hypertension, type 2 diabetes, COPD, and CHF who presents today complaining of increased weight. She states that every day she is told to take her weight and I'm called by the home health nurse. Today the home health nurse missed calling her. So she called the nurse hotline. She told them that her weight was up 5 pounds and they instructed her to come to the emergency department. She has not had any increased dyspnea. She is on 4 L nasal cannula at baseline. She has been doing her usual activities which include some housework. She has not noted any increased peripheral edema. Chest pain, nausea, vomiting, or dyspnea. She has been taking her usual Lasix dose. She was not instructed to take any increased Lasix. Past Medical History  Diagnosis Date  . Essential hypertension   . Arthritis   . Diverticulitis   . Type II diabetes mellitus ([place])   . H/O ventral hernia   . COPD (chronic obstructive pulmonary disease) ([place])   . Osteoporosis   . Adenocarcinoma of lung ([place])     Left lung 2009, resected  . Non-obstructive CAD     a. 04/2015 NSTEMI/Cath: LAD 10p, LCX 53m RCA 376m20d, EF 35-40 w/ apical ballooning.  . Takotsubo cardiomyopathy     a. 04/2015 Echo: EF 45-50%, mid-dist anterior/apical/inferoapical HK w/ hyperdynamic base. Gr 1 DD, mild AI, mild-mod MR, triv TR, PASP 4864m;  b. 04/2015 LV gram: Ef 35-40% w/ apical ballooning.  . DMarland Kitchenslipidemia   . Ventricular bigeminy     a. 04/2015 in setting of NSTEMI/Takotsubo.  . CHF (congestive heart failure) ([place] . Osteoporosis 11/04/2015    Managed by Dr. FanLegrand Rams. On home O2     3L N/C  . [facility]  [facility]  Past Surgical History  Procedure Laterality Date  . Cholecystectomy    . Ectopic pregnancy surgery    . Abdominal hysterectomy    . Lung cancer surgery    . Incisional hernia repair N/A 08/26/2013    Procedure: HERNIA REPAIR INCISIONAL WITH MESH;  Surgeon: MarJamesetta SoD;  Location: [facility];  Service: General;  Laterality: N/A;  . Colonoscopy N/A 09/18/2014    Procedure: COLONOSCOPY;  Surgeon: SanDanie BinderD;  Location: [facility];  Service: Endoscopy;  Laterality: N/A;  8:30 AM - moved to 10:30 - CRosendo Gros notify pt  . Cardiac catheterization N/A 04/17/2015    Procedure: Left Heart Cath and Coronary Angiography;  Surgeon: ThoTroy SineD;  Location: [facility];  Service: Cardiovascular;  Laterality: N/A;   Family History  Problem Relation Age of Onset  . Diabetes Mother   . Hypertension Mother   . Diabetes Father   . Asthma    . Cancer    . Colon cancer Neg Hx    Social History  Substance Use Topics  . Smoking status: Former Smoker -- 20 years    Types: Cigarettes    Quit date: 08/13/2006  . Smokeless tobacco: Never Used  . Alcohol Use: No   OB History    No data available     Review of Systems  All other systems reviewed and are negative.     Allergies  Lisinopril  Home Medications   Prior to Admission medications   Medication Sig Start Date End Date Taking? Authorizing Provider  albuterol (PROAIR HFA) 108 (90 BASE) MCG/ACT inhaler Inhale 2 puffs into the lungs every 4 (four) hours as needed for wheezing or shortness of breath.   Yes Historical Provider, MD  alendronate (FOSAMAX) 70 MG tablet Take 70 mg by mouth once a week. On Fridays 03/14/15  Yes Historical Provider, MD  ALPRAZolam Duanne Moron) 0.25 MG tablet Take 0.25 mg by mouth at bedtime.  04/30/15  Yes Historical Provider, MD  aspirin EC 81 MG tablet Take 81 mg by mouth daily.   Yes Historical Provider, MD  atorvastatin (LIPITOR) 80 MG tablet Take 1 tablet by mouth every morning.  06/30/15  Yes Historical Provider, MD  benzonatate (TESSALON) 100 MG capsule Take 100 mg by mouth 3 (three) times daily as needed. For cough 04/15/16  Yes Historical Provider, MD  buPROPion (WELLBUTRIN SR) 150 MG 12 hr tablet Take 150 mg by mouth 2 (two) times daily.   Yes Historical Provider, MD  Cholecalciferol (VITAMIN D-3) 1000 UNITS CAPS Take 1 capsule by mouth daily.   Yes Historical Provider, MD  fish oil-omega-3 fatty acids 1000 MG capsule Take 1 g by mouth 2 (two) times daily.    Yes Historical Provider, MD  furosemide (LASIX) 40 MG tablet Take 1 tablet (40 mg total) by mouth daily. 08/02/15  Yes Rosita Fire, MD  gabapentin (NEURONTIN) 300 MG capsule Take 300 mg by mouth daily.    Yes Historical Provider, MD  Garlic 621 MG TABS Take 300 mg by mouth daily.   Yes Historical Provider, MD  ipratropium-albuterol (DUONEB) 0.5-2.5 (3) MG/3ML SOLN Inhale 3 mLs into the lungs 4 (four) times daily.  09/05/14  Yes Historical Provider, MD  linagliptin (TRADJENTA) 5 MG TABS tablet Take 5 mg by mouth daily.   Yes Historical Provider, MD  loratadine (CLARITIN) 10 MG tablet Take 10 mg by mouth daily. 05/01/16  Yes Historical Provider, MD  losartan (COZAAR) 50 MG tablet Take 50 mg by mouth daily. 06/29/15  Yes Historical Provider, MD  meclizine (ANTIVERT) 25 MG tablet Take 1 tablet by mouth 3 (three) times daily as needed for dizziness.  09/05/14  Yes Historical Provider, MD  metoprolol succinate (TOPROL-XL) 25 MG 24 hr tablet Take 1 tablet (25 mg total) by mouth daily. 06/26/15  Yes Jettie Booze, MD  nitroGLYCERIN (NITROSTAT) 0.4 MG SL tablet Place 0.4 mg under the tongue every 5 (five) minutes as needed for chest pain.   Yes Historical Provider, MD  omeprazole (PRILOSEC) 20 MG capsule Take 20 mg by mouth daily.   Yes Historical Provider, MD  OXYGEN Inhale 3 L into the lungs continuous.   Yes Historical Provider, MD  pyridoxine (B-6) 100 MG tablet Take 100 mg by mouth daily.   Yes Historical Provider, MD   rOPINIRole (REQUIP) 0.25 MG tablet Take 0.25 mg by mouth 3 (three) times daily.  09/05/14  Yes Historical Provider, MD  sodium chloride (OCEAN) 0.65 % SOLN nasal spray Place 1 spray into both nostrils as needed for congestion.   Yes Historical Provider, MD  amoxicillin-clavulanate (AUGMENTIN) 500-125 MG tablet Take 1 tablet (500 mg total) by mouth 3 (three) times daily. Patient not taking: Reported on 05/18/2016 04/28/16   Rosita Fire, MD  predniSONE (STERAPRED UNI-PAK 21 TAB) 10 MG (21) TBPK tablet Take 1 tablet (10 mg total)  by mouth daily. 4 tab po daily for 3 days, then 3 tab po daily for 3  Days, 2 tab po daily for 3 days, 1 tab po daily for 3 days. Patient not taking: Reported on 05/18/2016 04/28/16   Rosita Fire, MD   BP 142/67 mmHg  Pulse 79  Temp(Src) 98.1 F (36.7 C) (Oral)  Resp 17  Ht '5\' 4"'$  (1.626 m)  Wt 78.926 kg  BMI 29.85 kg/m2  SpO2 97% Physical Exam  Constitutional: She appears well-developed and well-nourished. No distress.  HENT:  Head: Normocephalic and atraumatic.  Right Ear: External ear normal.  Left Ear: External ear normal.  Mouth/Throat: Oropharynx is clear and moist.  Eyes: Pupils are equal, round, and reactive to light.  Neck: Normal range of motion.  Cardiovascular: Normal rate, regular rhythm, normal heart sounds and intact distal pulses.   Pulmonary/Chest: Effort normal and breath sounds normal.  Abdominal: Soft. Bowel sounds are normal.  Musculoskeletal: She exhibits no edema or tenderness.  Neurological: She is alert. She has normal reflexes.  Skin: Skin is warm and dry.  Psychiatric: She has a normal mood and affect.  Nursing note and vitals reviewed.   ED Course  Procedures (including critical care time) Labs Review Labs Reviewed  COMPREHENSIVE METABOLIC PANEL - Abnormal; Notable for the following:    Potassium 3.3 (*)    Chloride 99 (*)    Glucose, Bld 204 (*)    Creatinine, Ser 2.18 (*)    GFR calc non Af Amer 20 (*)    GFR calc Af  Amer 23 (*)    All other components within normal limits  CBC  TROPONIN I  BRAIN NATRIURETIC PEPTIDE  PROTIME-INR  I-STAT TROPOININ, ED    Imaging Review Dg Chest Portable 1 View  05/18/2016  CLINICAL DATA:  Acute onset of swelling at the lower extremities. Initial encounter. EXAM: PORTABLE CHEST 1 VIEW COMPARISON:  Chest radiograph from 04/25/2016 FINDINGS: The lungs are well-aerated. Vascular congestion is noted. Left basilar atelectasis is grossly unchanged. No pneumothorax is seen. No definite pleural effusion is identified. The cardiomediastinal silhouette is borderline enlarged. No acute osseous abnormalities are seen. There is chronic superior subluxation of both humeral heads, reflecting underlying chronic rotator cuff tears. IMPRESSION: Vascular congestion and borderline cardiomegaly. Left basilar atelectasis is grossly unchanged. Electronically Signed   By: Garald Balding M.D.   On: 05/18/2016 19:50   I have personally reviewed and evaluated these images and lab results as part of my medical decision-making.   EKG Interpretation   Date/Time:  Sunday May 18 2016 19:08:55 EDT Ventricular Rate:  82 PR Interval:  146 QRS Duration: 86 QT Interval:  390 QTC Calculation: 455 R Axis:   33 Text Interpretation:  Sinus rhythm Abnormal R-wave progression, early  transition Anteroseptal infarct, age indeterminate Confirmed by Calypso Hagarty MD,  Andee Poles [phone]) on 05/18/2016 7:25:04 PM      MDM   Final diagnoses:  Chronic congestive heart failure, unspecified congestive heart failure type ([place])  Weight gain    82 year old female history of congestive heart failure told to come into the ED due to weight gain. Here she has borderline cardiomegaly and vascular congestion on chest x-Keidy Thurgood but normal BNP and exam. She is given an additional dose of 40 mg of Lasix. She has been stable on her oxygen with 97-98% saturation. She has no symptoms. She is advised to follow-up with her physician this week.  We have discussed return precautions and she voices understanding.  Pattricia Boss, MD 05/18/16 2107

## 2016-05-18 NOTE — ED Notes (Signed)
Pt ambulated to restroom on room air, SOB after ambulation. Stats 78-80%. Patient back on 2L/[place] and quickly rebounded back to baseline stats of low 90's.

## 2016-05-18 NOTE — Discharge Instructions (Signed)
Recheck with your doctor asap.  Continue home medication regimen.  Return if short of breath, chest pain, or other concerning symptoms.

## 2016-05-19 ENCOUNTER — Other Ambulatory Visit: Payer: Self-pay | Admitting: *Deleted

## 2016-05-19 ENCOUNTER — Telehealth: Payer: Self-pay | Admitting: Interventional Cardiology

## 2016-05-19 NOTE — Patient Outreach (Signed)
Voicemail from patient requesting call. Call to patient, patient sleeping, spoke with patient daughter, Mickel Baas. She states patient was in ED yesterday due to 5# weight gain overnight.  She requests Home visit. She plans to call Cardiologist to schedule a follow up appointment Set up home visit for patient. Plan: Daughter to call Cardiologist to get appointment for follow up for ED visit yesterday. RNCM will plan for visit later this week, if patient gets appointment with cardiologist for same day, will reschedule. Royetta Crochet. Laymond Purser, RN, BSN, [place] [phone]

## 2016-05-19 NOTE — Telephone Encounter (Signed)
Nurse from [facility] called the pt this am. I did not call. Also, I dont think this is Dr Hassell Done pt. Thanks.

## 2016-05-19 NOTE — Telephone Encounter (Signed)
New message   Pt daughter is calling   Pt verbalized that she just spoke to RN who offered an appt for today for her mother and   she is calling back to speak to the rn to schedule the appt for today

## 2016-05-20 ENCOUNTER — Ambulatory Visit: Payer: Self-pay | Admitting: *Deleted

## 2016-05-20 NOTE — Progress Notes (Signed)
Cardiology Office Note   Date:  05/21/2016   ID:  Angela Lawson, DOB 1934-06-30, MRN [id number]  PCP:  Rosita Fire, MD  Cardiologist:  Dr. Irish Lack    Chief Complaint  Patient presents with  . Shoulder Pain    shooting pain to the neck. constant pain in the shoulder. dull pain  . Leg Pain    constant pain, shooting when trying to stand. started 2 days ago. has not taken any medicine to soothe pain  . Shortness of Breath    with any activity inlcuding leisure walking. has SOB with O2      History of Present Illness: Angela Lawson is a 82 y.o. female who presents for post hospitalization for hypoxia with SOB and wheezing.  She was seen by pulmonology. She did have aspiration- on eval was mild aspiration risk.  Treated with steroids and ABX.  Overall she feels much better.    Seen back in ER 6/11/7 for wt gain of 5 lbs. There was no SOB.   She was given an extra dose of 40 mg lasix.    She has a history of Takotsubo cardiomyopathy in 04/2015. Cath showed non obstructive CAD only 10-30% stenosis. , EF 35-40%. EF 40-45% on echo 04/2015. F/U echo 06/04/15 EF normalized, diastolic dysfunction. Patient also has COPD on home O2, lung CA S/P left partial pneumonectomy 2009, CKD stage 3, ventricular bigeminy, HTN, dyslipidemia, DM II. Patient saw Dr. Irish Lack 06/04/15 and doing well. O2 sats 89% on RA, managed by Dr. Luan Pulling.  Today she states   Past Medical History  Diagnosis Date  . Essential hypertension   . Arthritis   . Diverticulitis   . Type II diabetes mellitus ([place])   . H/O ventral hernia   . COPD (chronic obstructive pulmonary disease) ([place])   . Osteoporosis   . Adenocarcinoma of lung ([place])     Left lung 2009, resected  . Non-obstructive CAD     a. 04/2015 NSTEMI/Cath: LAD 10p, LCX 79m RCA 337m20d, EF 35-40 w/ apical ballooning.  . Takotsubo cardiomyopathy     a. 04/2015 Echo: EF 45-50%, mid-dist anterior/apical/inferoapical HK w/ hyperdynamic base. Gr 1 DD, mild AI,  mild-mod MR, triv TR, PASP 4852m;  b. 04/2015 LV gram: Ef 35-40% w/ apical ballooning.  . DMarland Kitchenslipidemia   . Ventricular bigeminy     a. 04/2015 in setting of NSTEMI/Takotsubo.  . CHF (congestive heart failure) ([place] . Osteoporosis 11/04/2015    Managed by Dr. FanLegrand Rams. On home O2     3L N/C  . [facility]   Past Surgical History  Procedure Laterality Date  . Cholecystectomy    . Ectopic pregnancy surgery    . Abdominal hysterectomy    . Lung cancer surgery    . Incisional hernia repair N/A 08/26/2013    Procedure: HERNIA REPAIR INCISIONAL WITH MESH;  Surgeon: MarJamesetta SoD;  Location: [facility];  Service: General;  Laterality: N/A;  . Colonoscopy N/A 09/18/2014    Procedure: COLONOSCOPY;  Surgeon: SanDanie BinderD;  [facility];  Service: Endoscopy;  Laterality: N/A;  8:30 AM - moved to 10:30 - CRosendo Gros notify pt  . Cardiac catheterization N/A 04/17/2015    Procedure: Left Heart Cath and Coronary Angiography;  Surgeon: ThoTroy SineD;  Location: [facility];  Service: Cardiovascular;  Laterality: N/A;     Current Outpatient Prescriptions  Medication Sig Dispense Refill  .  albuterol (PROAIR HFA) 108 (90 BASE) MCG/ACT inhaler Inhale 2 puffs into the lungs every 4 (four) hours as needed for wheezing or shortness of breath.    Marland Kitchen alendronate (FOSAMAX) 70 MG tablet Take 70 mg by mouth once a week. On Fridays    . ALPRAZolam (XANAX) 0.25 MG tablet Take 0.25 mg by mouth at bedtime.   2  . aspirin EC 81 MG tablet Take 81 mg by mouth daily.    Marland Kitchen atorvastatin (LIPITOR) 80 MG tablet Take 1 tablet by mouth every morning.    . benzonatate (TESSALON) 100 MG capsule Take 100 mg by mouth 3 (three) times daily as needed. For cough  12  . buPROPion (WELLBUTRIN SR) 150 MG 12 hr tablet Take 150 mg by mouth 2 (two) times daily.    . Cholecalciferol (VITAMIN D-3) 1000 UNITS CAPS Take 1 capsule by mouth daily.    . fish oil-omega-3 fatty acids 1000 MG capsule  Take 1 g by mouth 2 (two) times daily.     . furosemide (LASIX) 40 MG tablet Take 1 tablet (40 mg total) by mouth daily. 30 tablet 3  . gabapentin (NEURONTIN) 300 MG capsule Take 300 mg by mouth daily.     . Garlic 124 MG TABS Take 300 mg by mouth daily.    Marland Kitchen ipratropium-albuterol (DUONEB) 0.5-2.5 (3) MG/3ML SOLN Inhale 3 mLs into the lungs 4 (four) times daily.     Marland Kitchen linagliptin (TRADJENTA) 5 MG TABS tablet Take 5 mg by mouth daily.    Marland Kitchen loratadine (CLARITIN) 10 MG tablet Take 10 mg by mouth daily.  3  . losartan (COZAAR) 50 MG tablet Take 50 mg by mouth daily.  2  . meclizine (ANTIVERT) 25 MG tablet Take 1 tablet by mouth 3 (three) times daily as needed for dizziness.     . metoprolol succinate (TOPROL-XL) 25 MG 24 hr tablet Take 1 tablet (25 mg total) by mouth daily. 90 tablet 3  . nitroGLYCERIN (NITROSTAT) 0.4 MG SL tablet Place 0.4 mg under the tongue every 5 (five) minutes as needed for chest pain.    Marland Kitchen omeprazole (PRILOSEC) 20 MG capsule Take 20 mg by mouth daily.    . OXYGEN Inhale 3 L into the lungs continuous.    Marland Kitchen pyridoxine (B-6) 100 MG tablet Take 100 mg by mouth daily.    Marland Kitchen rOPINIRole (REQUIP) 0.25 MG tablet Take 0.25 mg by mouth 3 (three) times daily.     . sodium chloride (OCEAN) 0.65 % SOLN nasal spray Place 1 spray into both nostrils as needed for congestion.     No current facility-administered medications for this visit.    Allergies:   Lisinopril    Social History:  The patient  reports that she quit smoking about 9 years ago. Her smoking use included Cigarettes. She quit after 20 years of use. She has never used smokeless tobacco. She reports that she does not drink alcohol or use illicit drugs.   Family History:  The patient's family history includes Diabetes in her father and mother; Hypertension in her mother. There is no history of Colon cancer.    ROS:  General:no colds or fevers, + weight increase Skin:no rashes or ulcers HEENT:no blurred vision, no  congestion CV:see HPI PUL:see HPI GI:no diarrhea constipation or melena, no indigestion GU:no hematuria, no dysuria MS:no joint pain, no claudication Neuro:no syncope, no lightheadedness Endo:no diabetes, no thyroid disease  Wt Readings from Last 3 Encounters:  05/21/16 178 lb 1.9 oz (  80.795 kg)  05/18/16 174 lb (78.926 kg)  04/27/16 180 lb 9.6 oz (81.92 kg)     PHYSICAL EXAM: VS:  BP 114/58 mmHg  Pulse 105  Ht '5\' 4"'$  (1.626 m)  Wt 178 lb 1.9 oz (80.795 kg)  BMI 30.56 kg/m2  SpO2 94% , BMI Body mass index is 30.56 kg/(m^2). General:Pleasant affect, NAD Skin:Warm and dry, brisk capillary refill HEENT:normocephalic, sclera clear, mucus membranes moist Neck:supple, no JVD, no bruits  Heart:S1S2 RRR without murmur, gallup, rub or click Lungs:diminished in bases, occ wheeze,  without rales, rhonchi, Oxygen in place at 3L. YWV:PXTG, non tender, + BS, do not palpate liver spleen or masses Ext:no lower ext edema, 2+ pedal pulses, 2+ radial pulses, Lt groin without hematoma at site of pain.  Neuro:alert and oriented, MAE X 3, follows commands, + facial symmetry    EKG:  EKG is not ordered today.    Recent Labs: 08/01/2015: TSH 1.571 05/18/2016: ALT 21; B Natriuretic Peptide 40.0; BUN 15; Creatinine, Ser 2.18*; Hemoglobin 12.3; Platelets 153; Potassium 3.3*; Sodium 138    Lipid Panel    Component Value Date/Time   CHOL 157 04/17/2015 0800   TRIG 87 04/17/2015 0800   HDL 59 04/17/2015 0800   CHOLHDL 2.7 04/17/2015 0800   VLDL 17 04/17/2015 0800   LDLCALC 81 04/17/2015 0800       Other studies Reviewed: Additional studies/ records that were reviewed today include: OV notes, hospitalizations. ECHO: 07/2015  Study Conclusions  - Left ventricle: The cavity size was normal. Wall thickness was  increased in a pattern of mild LVH. Systolic function was normal.  The estimated ejection fraction was in the range of 55% to 60%.  Wall motion was normal; there were no  regional wall motion  abnormalities. Doppler parameters are consistent with abnormal  left ventricular relaxation (grade 1 diastolic dysfunction). - Aortic valve: Mildly calcified annulus. Trileaflet. - Mitral valve: Calcified annulus. There was trivial regurgitation. - Right ventricle: The cavity size was mildly dilated. The  moderator band was prominent. Systolic function was mildly to  moderately reduced. - Right atrium: Central venous pressure (est): 3 mm Hg. - Atrial septum: There was increased thickness of the septum,  consistent with lipomatous hypertrophy. No defect or patent  foramen ovale was identified. - Tricuspid valve: There was trivial regurgitation. - Pulmonary arteries: PA peak pressure: 25 mm Hg (S). - Pericardium, extracardiac: There was no pericardial effusion.  Impressions:  - Mild LVH with LVEF 55-60% and grade 1 diastolic dysfunction. MAC  with trivial mitral regurgitation. Mildly dilated RV with mildly  to moderately decreased contraction. Trivial tricuspid  regurgitation with PASP 25 mmHg.  ASSESSMENT AND PLAN:   1. Chronic diastolic heart failure, with hospitalization for pneumonia.   LVEF stable at 55-60% by follow-up echocardiogram with grade 1 diastolic dysfunction. She is off ABX and steroids.  -today lungs with few rales, wil have her take extra lasix today and prn for wt gain > 3 lbs in a day or 5 lbs in a week.  -follow up in 3 weeks with me and 4 months with Dr. Irish Lack.  -continue oxygen.  2. History of Takotsubo cardiomyopathy in May 2016, LVEF remains stable.  3. Nonobstructive CAD by cardiac catheterization in May 2016. No angina symptoms. Cardiac markers argue against ACS.   4. CKD, stage 3, creatinine stable 1.3-1.4.  5. Shoulder pain and lt goin pain, not felt to be cardiac.    6. Dm-2 MANAGED BY PCP  6. Hypokalemia  in ER will recheck today and add Kdur 10 meq daily.  Though if K+ is improved would hold and use for extra  lasix.    Current medicines are reviewed with the patient today.  The patient Has no concerns regarding medicines.  The following changes have been made:  See above Labs/ tests ordered today include:see above  Disposition:   FU:  see above  Signed, Cecilie Kicks, NP  05/21/2016 1:36 PM    [facility] [place], [place], [address] [place], [place] Phone: [phone]; Fax: [phone]

## 2016-05-21 ENCOUNTER — Encounter: Payer: Self-pay | Admitting: Cardiology

## 2016-05-21 ENCOUNTER — Ambulatory Visit: Payer: Self-pay | Admitting: *Deleted

## 2016-05-21 ENCOUNTER — Ambulatory Visit (INDEPENDENT_AMBULATORY_CARE_PROVIDER_SITE_OTHER): Payer: Medicare Other | Admitting: Cardiology

## 2016-05-21 VITALS — BP 114/58 | HR 105 | Ht 64.0 in | Wt 178.1 lb

## 2016-05-21 DIAGNOSIS — I5181 Takotsubo syndrome: Secondary | ICD-10-CM | POA: Diagnosis not present

## 2016-05-21 DIAGNOSIS — I5032 Chronic diastolic (congestive) heart failure: Secondary | ICD-10-CM

## 2016-05-21 DIAGNOSIS — R0602 Shortness of breath: Secondary | ICD-10-CM | POA: Diagnosis not present

## 2016-05-21 DIAGNOSIS — I2510 Atherosclerotic heart disease of native coronary artery without angina pectoris: Secondary | ICD-10-CM

## 2016-05-21 DIAGNOSIS — I10 Essential (primary) hypertension: Secondary | ICD-10-CM

## 2016-05-21 DIAGNOSIS — E785 Hyperlipidemia, unspecified: Secondary | ICD-10-CM

## 2016-05-21 DIAGNOSIS — E876 Hypokalemia: Secondary | ICD-10-CM

## 2016-05-21 MED ORDER — FUROSEMIDE 40 MG PO TABS: ORAL_TABLET | ORAL | Status: DC

## 2016-05-21 MED ORDER — POTASSIUM CHLORIDE ER 10 MEQ PO TBCR: 10.0000 meq | EXTENDED_RELEASE_TABLET | Freq: Every day | ORAL | Status: DC

## 2016-05-21 NOTE — Patient Instructions (Addendum)
Medication Instructions:  Your physician has recommended you make the following change in your medication:  1.  START Potassium 10 meq taking 1 tablet daily 2.  TAKE a extra Lasix today... If you have a weight gain of 3 lbs in a day or 5 lbs in a week, then take an extra Lasix  Labwork: TODAY:  BMET  Testing/Procedures: None ordered  Follow-Up: Your physician recommends that you schedule a follow-up appointment in: [place] DR. VARANASI   Any Other Special Instructions Will Be Listed Below (If Applicable).     If you need a refill on your cardiac medications before your next appointment, please call your pharmacy.

## 2016-05-22 DIAGNOSIS — N183 Chronic kidney disease, stage 3 (moderate): Secondary | ICD-10-CM | POA: Diagnosis not present

## 2016-05-22 DIAGNOSIS — I509 Heart failure, unspecified: Secondary | ICD-10-CM | POA: Diagnosis not present

## 2016-05-22 DIAGNOSIS — J441 Chronic obstructive pulmonary disease with (acute) exacerbation: Secondary | ICD-10-CM | POA: Diagnosis not present

## 2016-05-22 DIAGNOSIS — Z7951 Long term (current) use of inhaled steroids: Secondary | ICD-10-CM | POA: Diagnosis not present

## 2016-05-22 DIAGNOSIS — I129 Hypertensive chronic kidney disease with stage 1 through stage 4 chronic kidney disease, or unspecified chronic kidney disease: Secondary | ICD-10-CM | POA: Diagnosis not present

## 2016-05-22 DIAGNOSIS — Z7982 Long term (current) use of aspirin: Secondary | ICD-10-CM | POA: Diagnosis not present

## 2016-05-22 DIAGNOSIS — Z9981 Dependence on supplemental oxygen: Secondary | ICD-10-CM | POA: Diagnosis not present

## 2016-05-22 DIAGNOSIS — C3490 Malignant neoplasm of unspecified part of unspecified bronchus or lung: Secondary | ICD-10-CM | POA: Diagnosis not present

## 2016-05-22 DIAGNOSIS — E1122 Type 2 diabetes mellitus with diabetic chronic kidney disease: Secondary | ICD-10-CM | POA: Diagnosis not present

## 2016-05-22 LAB — BASIC METABOLIC PANEL
BUN: 17 mg/dL (ref 7–25)
CO2: 24 mmol/L (ref 20–31)
Calcium: 9.9 mg/dL (ref 8.6–10.4)
Chloride: 98 mmol/L (ref 98–110)
Creat: 1.22 mg/dL — ABNORMAL HIGH (ref 0.60–0.88)
Glucose, Bld: 324 mg/dL — ABNORMAL HIGH (ref 65–99)
Potassium: 3.9 mmol/L (ref 3.5–5.3)
Sodium: 140 mmol/L (ref 135–146)

## 2016-05-25 DIAGNOSIS — J449 Chronic obstructive pulmonary disease, unspecified: Secondary | ICD-10-CM | POA: Diagnosis not present

## 2016-05-28 ENCOUNTER — Other Ambulatory Visit: Payer: Self-pay | Admitting: *Deleted

## 2016-05-28 NOTE — Patient Outreach (Signed)
[place] [facility]) Care Management   05/28/2016  Angela Lawson Mar 06, 1934 [id number]  Angela Lawson is an 82 y.o. female  Subjective:  "I have a pain in my left hip and leg radiating to my back, I am seeing Dr. Legrand Rams on Friday" "I received my insulin in the mail" Patient daughter, Mickel Baas, states home health is coming out to teach how to give insulin, patient still under home health care for therapy and nursing.  Objective:   BP 110/64 mmHg  Pulse 60  Resp 20  Wt 178 lb (80.74 kg)  SpO2 98% Review of Systems  Constitutional: Negative.   HENT: Negative.   Eyes: Negative.   Respiratory: Negative.   Gastrointestinal: Negative.   Genitourinary: Negative.   Musculoskeletal: Positive for back pain and joint pain. Negative for falls.  Skin: Negative.   Neurological: Negative.   Endo/Heme/Allergies: Negative.   Psychiatric/Behavioral: Negative.     Physical Exam  Constitutional: She is oriented to person, place, and time. She appears well-developed and well-nourished.  Cardiovascular: Normal rate and regular rhythm.   Respiratory: Effort normal and breath sounds normal.  GI: Soft. Bowel sounds are normal.  Musculoskeletal: Normal range of motion.  Neurological: She is alert and oriented to person, place, and time.  Skin: Skin is warm and dry.    Encounter Medications:   Outpatient Encounter Prescriptions as of 05/28/2016  Medication Sig Note  . albuterol (PROAIR HFA) 108 (90 BASE) MCG/ACT inhaler Inhale 2 puffs into the lungs every 4 (four) hours as needed for wheezing or shortness of breath.   Marland Kitchen alendronate (FOSAMAX) 70 MG tablet Take 70 mg by mouth once a week. On Fridays   . ALPRAZolam (XANAX) 0.25 MG tablet Take 0.25 mg by mouth at bedtime.    Marland Kitchen aspirin EC 81 MG tablet Take 81 mg by mouth daily.   Marland Kitchen atorvastatin (LIPITOR) 80 MG tablet Take 1 tablet by mouth every morning. 08/01/2015: Received from: External Pharmacy Received Sig:   . benzonatate (TESSALON) 100 MG  capsule Take 100 mg by mouth 3 (three) times daily as needed. For cough   . buPROPion (WELLBUTRIN SR) 150 MG 12 hr tablet Take 150 mg by mouth 2 (two) times daily.   . Cholecalciferol (VITAMIN D-3) 1000 UNITS CAPS Take 1 capsule by mouth daily.   . fish oil-omega-3 fatty acids 1000 MG capsule Take 1 g by mouth 2 (two) times daily.    . furosemide (LASIX) 40 MG tablet Take 1 tablet by mouth daily, take 1 extra pill daily as needed for weight increase of 3 lbs in a day or 5 lbs in a week   . gabapentin (NEURONTIN) 300 MG capsule Take 300 mg by mouth daily.    . Garlic 932 MG TABS Take 300 mg by mouth daily.   Marland Kitchen ipratropium-albuterol (DUONEB) 0.5-2.5 (3) MG/3ML SOLN Inhale 3 mLs into the lungs 4 (four) times daily.    Marland Kitchen linagliptin (TRADJENTA) 5 MG TABS tablet Take 5 mg by mouth daily.   Marland Kitchen loratadine (CLARITIN) 10 MG tablet Take 10 mg by mouth daily. 05/18/2016: Received from: External Pharmacy Received Sig: Take 10 mg by mouth daily.  Marland Kitchen losartan (COZAAR) 50 MG tablet Take 50 mg by mouth daily. 07/03/2015: Received from: External Pharmacy  . meclizine (ANTIVERT) 25 MG tablet Take 1 tablet by mouth 3 (three) times daily as needed for dizziness.    . metoprolol succinate (TOPROL-XL) 25 MG 24 hr tablet Take 1 tablet (25 mg total) by  mouth daily.   . nitroGLYCERIN (NITROSTAT) 0.4 MG SL tablet Place 0.4 mg under the tongue every 5 (five) minutes as needed for chest pain.   Marland Kitchen omeprazole (PRILOSEC) 20 MG capsule Take 20 mg by mouth daily.   . OXYGEN Inhale 3 L into the lungs continuous.   . potassium chloride (K-DUR) 10 MEQ tablet Take 1 tablet (10 mEq total) by mouth daily.   Marland Kitchen rOPINIRole (REQUIP) 0.25 MG tablet Take 0.25 mg by mouth 3 (three) times daily.    . sodium chloride (OCEAN) 0.65 % SOLN nasal spray Place 1 spray into both nostrils as needed for congestion.   Marland Kitchen pyridoxine (B-6) 100 MG tablet Take 100 mg by mouth daily. Reported on 05/28/2016    No facility-administered encounter medications on file  as of 05/28/2016.    Functional Status:   In your present state of health, do you have any difficulty performing the following activities: 05/13/2016 04/25/2016  Hearing? - Y  Vision? - N  Difficulty concentrating or making decisions? - N  Walking or climbing stairs? - Y  Dressing or bathing? - N  Doing errands, shopping? - N  Conservation officer, nature and eating ? N -  Using the Toilet? N -  In the past six months, have you accidently leaked urine? N -  Do you have problems with loss of bowel control? N -  Managing your Medications? N -  Managing your Finances? N -  Housekeeping or managing your Housekeeping? Y -    Fall/Depression Screening:    PHQ 2/9 Scores 04/08/2016 03/13/2016 02/14/2016 01/22/2016 12/25/2015 12/21/2015 11/26/2015  PHQ - 2 Score 0 0 0 0 0 1 0  PHQ- 9 Score - - - - - 4 -    Assessment:   HF: patient weighing daily with [employer] telehealth scale COPD: no symptoms, reviewed COPD zones Diabetes: patient CBGs elevated, new insulin but was not given the pen needles Plan:  [facility] CM Care Plan Problem One        Most Recent Value   Care Plan Problem One  Knowledge deficit COPD aeb recent hospitalizationi   Role Documenting the Problem One  Care Management Coordinator   Care Plan for Problem One  Active   [facility] Long Term Goal (31-90 days)  Patient will not have any hospitalizations for COPD over the next 31 days   [facility] Long Term Goal Start Date  04/29/16   Interventions for Problem One Long Term Goal  Reviewed COPD zones with patient and daughter using teachback    [facility] CM Care Plan Problem Two        Most Recent Value   Care Plan Problem Two  Knowledge deficit related to diabete-new to insulin   Role Documenting the Problem Two  Care Management Coordinator   [facility] CM Short Term Goal #1 (0-30 days)  Patient will be able to self inject insulin with in the next 7 days    [facility] CM Short Term Goal #1 Start Date  05/28/16   Interventions for Short Term Goal #2   Gave and reviewed using teachback EMMI  education on inulin and insulin injections. Call to [facility] regarding insulun pen needles, they will get rx and mail out 90 day supply    Patient to see Dr. Legrand Rams on Friday to assess for left leg/hip/back pain Call next week and assess patient Possible discharge back to health coach.  Royetta Crochet. Laymond Purser, RN, BSN, [place] [phone]

## 2016-05-30 ENCOUNTER — Other Ambulatory Visit (HOSPITAL_COMMUNITY): Payer: Self-pay | Admitting: Internal Medicine

## 2016-05-30 ENCOUNTER — Ambulatory Visit (HOSPITAL_COMMUNITY)
Admission: RE | Admit: 2016-05-30 | Discharge: 2016-05-30 | Disposition: A | Discharge status: Home / Self Care | Payer: Medicare Other | Source: Ambulatory Visit | Attending: Internal Medicine | Admitting: Internal Medicine

## 2016-05-30 DIAGNOSIS — M25552 Pain in left hip: Secondary | ICD-10-CM

## 2016-05-30 DIAGNOSIS — J449 Chronic obstructive pulmonary disease, unspecified: Secondary | ICD-10-CM | POA: Diagnosis not present

## 2016-05-30 DIAGNOSIS — E1165 Type 2 diabetes mellitus with hyperglycemia: Secondary | ICD-10-CM | POA: Diagnosis not present

## 2016-05-30 DIAGNOSIS — E1122 Type 2 diabetes mellitus with diabetic chronic kidney disease: Secondary | ICD-10-CM | POA: Diagnosis not present

## 2016-05-30 DIAGNOSIS — M1611 Unilateral primary osteoarthritis, right hip: Secondary | ICD-10-CM | POA: Insufficient documentation

## 2016-05-30 DIAGNOSIS — J441 Chronic obstructive pulmonary disease with (acute) exacerbation: Secondary | ICD-10-CM | POA: Diagnosis not present

## 2016-05-30 DIAGNOSIS — Z7982 Long term (current) use of aspirin: Secondary | ICD-10-CM | POA: Diagnosis not present

## 2016-05-30 DIAGNOSIS — M1612 Unilateral primary osteoarthritis, left hip: Secondary | ICD-10-CM | POA: Diagnosis not present

## 2016-05-30 DIAGNOSIS — I129 Hypertensive chronic kidney disease with stage 1 through stage 4 chronic kidney disease, or unspecified chronic kidney disease: Secondary | ICD-10-CM | POA: Diagnosis not present

## 2016-05-30 DIAGNOSIS — Z1231 Encounter for screening mammogram for malignant neoplasm of breast: Secondary | ICD-10-CM

## 2016-05-30 DIAGNOSIS — N183 Chronic kidney disease, stage 3 (moderate): Secondary | ICD-10-CM | POA: Diagnosis not present

## 2016-05-30 DIAGNOSIS — I509 Heart failure, unspecified: Secondary | ICD-10-CM | POA: Diagnosis not present

## 2016-05-30 DIAGNOSIS — C3490 Malignant neoplasm of unspecified part of unspecified bronchus or lung: Secondary | ICD-10-CM | POA: Diagnosis not present

## 2016-05-30 DIAGNOSIS — Z7951 Long term (current) use of inhaled steroids: Secondary | ICD-10-CM | POA: Diagnosis not present

## 2016-05-30 DIAGNOSIS — Z9981 Dependence on supplemental oxygen: Secondary | ICD-10-CM | POA: Diagnosis not present

## 2016-06-03 DIAGNOSIS — J441 Chronic obstructive pulmonary disease with (acute) exacerbation: Secondary | ICD-10-CM | POA: Diagnosis not present

## 2016-06-03 DIAGNOSIS — Z7982 Long term (current) use of aspirin: Secondary | ICD-10-CM | POA: Diagnosis not present

## 2016-06-03 DIAGNOSIS — I129 Hypertensive chronic kidney disease with stage 1 through stage 4 chronic kidney disease, or unspecified chronic kidney disease: Secondary | ICD-10-CM | POA: Diagnosis not present

## 2016-06-03 DIAGNOSIS — I509 Heart failure, unspecified: Secondary | ICD-10-CM | POA: Diagnosis not present

## 2016-06-03 DIAGNOSIS — N183 Chronic kidney disease, stage 3 (moderate): Secondary | ICD-10-CM | POA: Diagnosis not present

## 2016-06-03 DIAGNOSIS — Z7951 Long term (current) use of inhaled steroids: Secondary | ICD-10-CM | POA: Diagnosis not present

## 2016-06-03 DIAGNOSIS — Z9981 Dependence on supplemental oxygen: Secondary | ICD-10-CM | POA: Diagnosis not present

## 2016-06-03 DIAGNOSIS — E1122 Type 2 diabetes mellitus with diabetic chronic kidney disease: Secondary | ICD-10-CM | POA: Diagnosis not present

## 2016-06-03 DIAGNOSIS — C3490 Malignant neoplasm of unspecified part of unspecified bronchus or lung: Secondary | ICD-10-CM | POA: Diagnosis not present

## 2016-06-09 DIAGNOSIS — E1122 Type 2 diabetes mellitus with diabetic chronic kidney disease: Secondary | ICD-10-CM | POA: Diagnosis not present

## 2016-06-09 DIAGNOSIS — C3490 Malignant neoplasm of unspecified part of unspecified bronchus or lung: Secondary | ICD-10-CM | POA: Diagnosis not present

## 2016-06-09 DIAGNOSIS — J441 Chronic obstructive pulmonary disease with (acute) exacerbation: Secondary | ICD-10-CM | POA: Diagnosis not present

## 2016-06-09 DIAGNOSIS — N183 Chronic kidney disease, stage 3 (moderate): Secondary | ICD-10-CM | POA: Diagnosis not present

## 2016-06-09 DIAGNOSIS — I129 Hypertensive chronic kidney disease with stage 1 through stage 4 chronic kidney disease, or unspecified chronic kidney disease: Secondary | ICD-10-CM | POA: Diagnosis not present

## 2016-06-09 DIAGNOSIS — Z7951 Long term (current) use of inhaled steroids: Secondary | ICD-10-CM | POA: Diagnosis not present

## 2016-06-09 DIAGNOSIS — Z7982 Long term (current) use of aspirin: Secondary | ICD-10-CM | POA: Diagnosis not present

## 2016-06-09 DIAGNOSIS — Z9981 Dependence on supplemental oxygen: Secondary | ICD-10-CM | POA: Diagnosis not present

## 2016-06-09 DIAGNOSIS — I509 Heart failure, unspecified: Secondary | ICD-10-CM | POA: Diagnosis not present

## 2016-06-11 NOTE — Progress Notes (Addendum)
Cardiology Office Note   Date:  06/12/2016   ID:  Angela Lawson, DOB 15-Jul-1934, MRN [id number]  PCP:  Rosita Fire, MD  Cardiologist:  Dr. Irish Lack    Chief Complaint  Patient presents with  . Congestive Heart Failure      History of Present Illness: Angela Lawson is a 82 y.o. female who presents for CHF.   She has hx of hospitalization for hypoxia with SOB and wheezing. She was seen by pulmonology. She did have aspiration- on eval was mild aspiration risk. Treated with steroids and ABX. Overall she feels much better from breathing stand point.     Seen back in ER 6/11/7 for wt gain of 5 lbs. There was no SOB. She was given an extra dose of 40 mg lasix.   She has a history of Takotsubo cardiomyopathy in 04/2015. Cath showed non obstructive CAD only 10-30% stenosis. , EF 35-40%. EF 40-45% on echo 04/2015. F/U echo 06/04/15 EF normalized, diastolic dysfunction. Patient also has COPD on home O2, lung CA S/P left partial pneumonectomy 2009, CKD stage 3, ventricular bigeminy, HTN, dyslipidemia, DM II. Patient saw Dr. Irish Lack 06/04/15 and doing well. O2 sats 89% on RA, COPD managed by Dr. Luan Pulling.    Her Lt. hip is painful to Wt and into Lt side.  Started 3 weeks ago.  She is using a cane which helps no fevers. She has seen Dr. Aline Brochure ortho in [place] and will call today to be seen.    She also complains of hoarseness that is progressing.  She is on Claritin and feels no drainage.  She does not have indigestion or reflux ? Silent.   Previous Lt shoulder pain resolved.  No chest pain. No problems with SOB, she has used the prn lasix once for wt gain and wt came down.  She continues to watch the salt in her diet.  BP is low but no lightheadedness or dizziness.    Past Medical History  Diagnosis Date  . Essential hypertension   . Arthritis   . Diverticulitis   . Type II diabetes mellitus ([place])   . H/O ventral hernia   . COPD (chronic obstructive pulmonary disease) ([place])   .  Osteoporosis   . Adenocarcinoma of lung ([place])     Left lung 2009, resected  . Non-obstructive CAD     a. 04/2015 NSTEMI/Cath: LAD 10p, LCX 50m RCA 364m20d, EF 35-40 w/ apical ballooning.  . Takotsubo cardiomyopathy     a. 04/2015 Echo: EF 45-50%, mid-dist anterior/apical/inferoapical HK w/ hyperdynamic base. Gr 1 DD, mild AI, mild-mod MR, triv TR, PASP 4883m;  b. 04/2015 LV gram: Ef 35-40% w/ apical ballooning.  . DMarland Kitchenslipidemia   . Ventricular bigeminy     a. 04/2015 in setting of NSTEMI/Takotsubo.  . CHF (congestive heart failure) ([facility] . Osteoporosis 11/04/2015    Managed by Dr. FanLegrand Rams. On home O2     3L N/C  . [facility]'s disease ([facility]   Past Surgical History  Procedure Laterality Date  . Cholecystectomy    . Ectopic pregnancy surgery    . Abdominal hysterectomy    . Lung cancer surgery    . Incisional hernia repair N/A 08/26/2013    Procedure: HERNIA REPAIR INCISIONAL WITH MESH;  Surgeon: MarJamesetta SoD;  Location: [facility] ORS;  Service: General;  Laterality: N/A;  . Colonoscopy N/A 09/18/2014    Procedure: COLONOSCOPY;  Surgeon: SanDanie BinderD;  Location:  [facility] ENDO SUITE;  Service: Endoscopy;  Laterality: N/A;  8:30 AM - moved to 10:30 Rosendo Gros to notify pt  . Cardiac catheterization N/A 04/17/2015    Procedure: Left Heart Cath and Coronary Angiography;  Surgeon: Troy Sine, MD;  Location: [facility];  Service: Cardiovascular;  Laterality: N/A;     Current Outpatient Prescriptions  Medication Sig Dispense Refill  . albuterol (PROAIR HFA) 108 (90 BASE) MCG/ACT inhaler Inhale 2 puffs into the lungs every 4 (four) hours as needed for wheezing or shortness of breath.    Marland Kitchen alendronate (FOSAMAX) 70 MG tablet Take 70 mg by mouth once a week. On Fridays    . aspirin EC 81 MG tablet Take 81 mg by mouth daily.    Marland Kitchen atorvastatin (LIPITOR) 80 MG tablet Take 1 tablet by mouth every morning.    . B-D ULTRAFINE III SHORT PEN 31G X 8 MM MISC See admin instructions.  3  .  benzonatate (TESSALON) 100 MG capsule Take 100 mg by mouth 3 (three) times daily as needed. For cough  12  . buPROPion (WELLBUTRIN SR) 150 MG 12 hr tablet Take 150 mg by mouth 2 (two) times daily.    . Cholecalciferol (VITAMIN D-3) 1000 UNITS CAPS Take 1 capsule by mouth daily.    . fish oil-omega-3 fatty acids 1000 MG capsule Take 1 g by mouth 2 (two) times daily.     . furosemide (LASIX) 40 MG tablet Take 1 tablet by mouth daily, take 1 extra pill daily as needed for weight increase of 3 lbs in a day or 5 lbs in a week 45 tablet 3  . gabapentin (NEURONTIN) 300 MG capsule Take 300 mg by mouth daily.     . Garlic 324 MG TABS Take 300 mg by mouth daily.    Marland Kitchen ipratropium-albuterol (DUONEB) 0.5-2.5 (3) MG/3ML SOLN Inhale 3 mLs into the lungs 4 (four) times daily.     Marland Kitchen LANTUS SOLOSTAR 100 UNIT/ML Solostar Pen Inject 15 Units into the skin daily at 10 pm.     . linagliptin (TRADJENTA) 5 MG TABS tablet Take 5 mg by mouth daily.    Marland Kitchen loratadine (CLARITIN) 10 MG tablet Take 10 mg by mouth daily.  3  . losartan (COZAAR) 50 MG tablet Take 50 mg by mouth daily.  2  . metoprolol succinate (TOPROL-XL) 25 MG 24 hr tablet Take 1 tablet (25 mg total) by mouth daily. 90 tablet 3  . nitroGLYCERIN (NITROSTAT) 0.4 MG SL tablet Place 0.4 mg under the tongue every 5 (five) minutes as needed for chest pain.    Marland Kitchen omeprazole (PRILOSEC) 20 MG capsule Take 20 mg by mouth daily.    . OXYGEN Inhale 3 L into the lungs continuous.    . potassium chloride (K-DUR) 10 MEQ tablet Take 1 tablet (10 mEq total) by mouth daily. 90 tablet 3  . pyridoxine (B-6) 100 MG tablet Take 100 mg by mouth daily. Reported on 05/28/2016    . rOPINIRole (REQUIP) 0.25 MG tablet Take 0.25 mg by mouth 3 (three) times daily.     . sodium chloride (OCEAN) 0.65 % SOLN nasal spray Place 1 spray into both nostrils as needed for congestion.    . SYMBICORT 160-4.5 MCG/ACT inhaler Inhale 1 puff into the lungs 2 (two) times daily.  3   No current  facility-administered medications for this visit.    Allergies:   Lisinopril    Social History:  The patient  reports that  she quit smoking about 9 years ago. Her smoking use included Cigarettes. She quit after 20 years of use. She has never used smokeless tobacco. She reports that she does not drink alcohol or use illicit drugs.   Family History:  The patient's family history includes Diabetes in her father and mother; Heart attack in her sister; Hypertension in her mother. There is no history of Colon cancer.    ROS:  General:no colds or fevers, no weight changes Skin:no rashes or ulcers HEENT:no blurred vision, no congestion, + hoarsness CV:see HPI PUL:see HPI GI:no diarrhea constipation or melena, no indigestion GU:no hematuria, no dysuria MS:Lt hip  joint pain, no claudication Neuro:no syncope, no lightheadedness Endo:+  diabetes, no thyroid disease  Wt Readings from Last 3 Encounters:  06/12/16 179 lb 1.9 oz (81.248 kg)  05/28/16 178 lb (80.74 kg)  05/21/16 178 lb 1.9 oz (80.795 kg)     PHYSICAL EXAM: VS:  BP 100/60 mmHg  Pulse 80  Ht '5\' 4"'$  (1.626 m)  Wt 179 lb 1.9 oz (81.248 kg)  BMI 30.73 kg/m2  SpO2 93% , BMI Body mass index is 30.73 kg/(m^2). General:Pleasant affect, NAD Skin:Warm and dry, brisk capillary refill HEENT:normocephalic, sclera clear, mucus membranes moist Neck:supple, no JVD, no bruits  Heart:S1S2 RRR without murmur, gallup, rub or click Lungs:clear without rales, rhonchi, or wheezes POE:UMPN, non tender, + BS, do not palpate liver spleen or masses Ext:no lower ext edema, , 2+ radial pulses, lt hip pain can only raise Lt leg 20 degrees without pain.    Neuro:alert and oriented X 3, MAE, follows commands, + facial symmetry    EKG:  EKG is NOT ordered today.   Recent Labs: 08/01/2015: TSH 1.571 05/18/2016: ALT 21; B Natriuretic Peptide 40.0; Hemoglobin 12.3; Platelets 153 05/21/2016: BUN 17; Creat 1.22*; Potassium 3.9; Sodium 140    Lipid  Panel    Component Value Date/Time   CHOL 157 04/17/2015 0800   TRIG 87 04/17/2015 0800   HDL 59 04/17/2015 0800   CHOLHDL 2.7 04/17/2015 0800   VLDL 17 04/17/2015 0800   LDLCALC 81 04/17/2015 0800       Other studies Reviewed: Additional studies/ records that were reviewed today include: previous notes and echo. See last note. .   ASSESSMENT AND PLAN:  1. Chronic diastolic heart failure, with hospitalization for pneumonia. LVEF stable at 55-60% by follow-up echocardiogram with grade 1 diastolic dysfunction. She is off ABX and steroids.  -continue lasix and prn for wt gain > 3 lbs in a day or 5 lbs in a week.  - follow up with Dr. Irish Lack. in 4 months. -continue oxygen.  2. History of Takotsubo cardiomyopathy in May 2016, LVEF remains stable.  3. Nonobstructive CAD by cardiac catheterization in May 2016. No angina symptoms.  4. CKD, stage 3, creatinine stable 1.3-1.4.  5. Lt goin and Lt hip pain, not felt to be cardiac.- to see orthopedist in [place].   6. Dm-2 MANAGED BY PCP  6. Hypokalemia in ER will recheck today and add Kdur 10 meq daily. Though if K+ is improved would hold and use for extra lasix. will recheck today.  7. Hoarseness will send to Dr Janace Hoard in [place].  ? Silent reflux. I increased her Prilosec to BID in the meantime.   Current medicines are reviewed with the patient today. The patient Has no concerns regarding medicines.  The following changes have been made: See above  Current medicines are reviewed with the patient today.  The patient  Has no concerns regarding medicines.  The following changes have been made:  See above Labs/ tests ordered today include:see above  Disposition:   FU:  see above  Signed, Cecilie Kicks, NP  06/12/2016 9:40 AM    [address], [place], [place], [place] Phone: [phone]; Fax: [phone]

## 2016-06-12 ENCOUNTER — Telehealth: Payer: Self-pay | Admitting: Interventional Cardiology

## 2016-06-12 ENCOUNTER — Encounter (INDEPENDENT_AMBULATORY_CARE_PROVIDER_SITE_OTHER): Payer: Self-pay

## 2016-06-12 ENCOUNTER — Telehealth: Payer: Self-pay | Admitting: Cardiology

## 2016-06-12 ENCOUNTER — Encounter: Payer: Self-pay | Admitting: Cardiology

## 2016-06-12 ENCOUNTER — Ambulatory Visit (INDEPENDENT_AMBULATORY_CARE_PROVIDER_SITE_OTHER): Payer: Medicare Other | Admitting: Cardiology

## 2016-06-12 ENCOUNTER — Telehealth: Payer: Self-pay | Admitting: *Deleted

## 2016-06-12 VITALS — BP 100/60 | HR 80 | Ht 64.0 in | Wt 179.1 lb

## 2016-06-12 DIAGNOSIS — N183 Chronic kidney disease, stage 3 unspecified: Secondary | ICD-10-CM

## 2016-06-12 DIAGNOSIS — R490 Dysphonia: Secondary | ICD-10-CM

## 2016-06-12 DIAGNOSIS — E876 Hypokalemia: Secondary | ICD-10-CM

## 2016-06-12 DIAGNOSIS — I5032 Chronic diastolic (congestive) heart failure: Secondary | ICD-10-CM | POA: Diagnosis not present

## 2016-06-12 DIAGNOSIS — I2510 Atherosclerotic heart disease of native coronary artery without angina pectoris: Secondary | ICD-10-CM

## 2016-06-12 DIAGNOSIS — I10 Essential (primary) hypertension: Secondary | ICD-10-CM

## 2016-06-12 DIAGNOSIS — Z85118 Personal history of other malignant neoplasm of bronchus and lung: Secondary | ICD-10-CM

## 2016-06-12 DIAGNOSIS — M25552 Pain in left hip: Secondary | ICD-10-CM

## 2016-06-12 DIAGNOSIS — I5181 Takotsubo syndrome: Secondary | ICD-10-CM

## 2016-06-12 LAB — BASIC METABOLIC PANEL
BUN: 35 mg/dL — ABNORMAL HIGH (ref 7–25)
CO2: 29 mmol/L (ref 20–31)
Calcium: 9.1 mg/dL (ref 8.6–10.4)
Chloride: 100 mmol/L (ref 98–110)
Creat: 1.74 mg/dL — ABNORMAL HIGH (ref 0.60–0.88)
Glucose, Bld: 304 mg/dL — ABNORMAL HIGH (ref 65–99)
Potassium: 4.6 mmol/L (ref 3.5–5.3)
Sodium: 139 mmol/L (ref 135–146)

## 2016-06-12 MED ORDER — OMEPRAZOLE 20 MG PO CPDR: 20.0000 mg | DELAYED_RELEASE_CAPSULE | Freq: Two times a day (BID) | ORAL | Status: DC

## 2016-06-12 NOTE — Telephone Encounter (Signed)
Pt daughter, Judson Roch, DPR on file, has been made aware of pts lab results.  She will hold Lasix Friday & Saturday and restart on Sunday with 1 tablet and repeat BMET 06/18/16 Orders in EPIC.

## 2016-06-12 NOTE — Telephone Encounter (Signed)
Wrong provider

## 2016-06-12 NOTE — Telephone Encounter (Signed)
-----   Message from Isaiah Serge, NP sent at 06/12/2016  4:34 PM EDT ----- Hold lasix for 2 days then resume one daily recheck BMP next Wed. The 12th.

## 2016-06-12 NOTE — Telephone Encounter (Signed)
Follow Up:; ° ° °Returning your call. °

## 2016-06-12 NOTE — Patient Instructions (Signed)
Medication Instructions:  Your physician has recommended you make the following change in your medication:  1. Increase Prilosec ( 20 mg ) twice daily.  Sent in to pt's pharmacy.   Labwork: Your physician recommends that you have lab work today: bmet   Testing/Procedures: -None  Follow-Up: Your physician wants you to follow-up in: 3.5 months with Dr. Irish Lack.  You will receive a reminder letter in the mail two months in advance. If you don't receive a letter, please call our office to schedule the follow-up appointment. You have been referred to ENT to Dr. Etheleen Sia for hoarseness   Any Other Special Instructions Will Be Listed Below (If Applicable). If continued Left Hip Pain call orthopedic Dr. Aline Brochure.    If you need a refill on your cardiac medications before your next appointment, please call your pharmacy.

## 2016-06-16 ENCOUNTER — Encounter (HOSPITAL_COMMUNITY): Payer: Self-pay | Admitting: Emergency Medicine

## 2016-06-16 ENCOUNTER — Ambulatory Visit: Payer: Medicare Other | Admitting: Cardiology

## 2016-06-16 ENCOUNTER — Emergency Department (HOSPITAL_COMMUNITY)
Admission: EM | Admit: 2016-06-16 | Discharge: 2016-06-16 | Disposition: A | Discharge status: Home / Self Care | Payer: Medicare Other | Attending: Emergency Medicine | Admitting: Emergency Medicine

## 2016-06-16 ENCOUNTER — Emergency Department (HOSPITAL_COMMUNITY): Payer: Medicare Other

## 2016-06-16 DIAGNOSIS — Z79899 Other long term (current) drug therapy: Secondary | ICD-10-CM | POA: Diagnosis not present

## 2016-06-16 DIAGNOSIS — Z87891 Personal history of nicotine dependence: Secondary | ICD-10-CM | POA: Insufficient documentation

## 2016-06-16 DIAGNOSIS — M25552 Pain in left hip: Secondary | ICD-10-CM | POA: Diagnosis not present

## 2016-06-16 DIAGNOSIS — E119 Type 2 diabetes mellitus without complications: Secondary | ICD-10-CM | POA: Diagnosis not present

## 2016-06-16 DIAGNOSIS — M1990 Unspecified osteoarthritis, unspecified site: Secondary | ICD-10-CM | POA: Insufficient documentation

## 2016-06-16 DIAGNOSIS — G20 Parkinson's disease: Secondary | ICD-10-CM | POA: Diagnosis not present

## 2016-06-16 DIAGNOSIS — E785 Hyperlipidemia, unspecified: Secondary | ICD-10-CM | POA: Diagnosis not present

## 2016-06-16 DIAGNOSIS — I110 Hypertensive heart disease with heart failure: Secondary | ICD-10-CM | POA: Insufficient documentation

## 2016-06-16 DIAGNOSIS — Z7984 Long term (current) use of oral hypoglycemic drugs: Secondary | ICD-10-CM | POA: Insufficient documentation

## 2016-06-16 DIAGNOSIS — J449 Chronic obstructive pulmonary disease, unspecified: Secondary | ICD-10-CM | POA: Insufficient documentation

## 2016-06-16 DIAGNOSIS — Z7982 Long term (current) use of aspirin: Secondary | ICD-10-CM | POA: Insufficient documentation

## 2016-06-16 DIAGNOSIS — I509 Heart failure, unspecified: Secondary | ICD-10-CM | POA: Diagnosis not present

## 2016-06-16 DIAGNOSIS — R0602 Shortness of breath: Secondary | ICD-10-CM | POA: Diagnosis present

## 2016-06-16 MED ORDER — HYDROCODONE-ACETAMINOPHEN 5-325 MG PO TABS: 1.0000 | ORAL_TABLET | Freq: Four times a day (QID) | ORAL | Status: DC | PRN

## 2016-06-16 MED ORDER — OXYCODONE-ACETAMINOPHEN 5-325 MG PO TABS
ORAL_TABLET | ORAL | Status: AC
  Administered 2016-06-16: 1 via ORAL
  Filled 2016-06-16: qty 1

## 2016-06-16 MED ORDER — HYDROMORPHONE HCL 1 MG/ML IJ SOLN
1.0000 mg | Freq: Once | INTRAMUSCULAR | Status: AC
  Administered 2016-06-16: 1 mg via INTRAMUSCULAR
  Filled 2016-06-16: qty 1

## 2016-06-16 MED ORDER — OXYCODONE-ACETAMINOPHEN 5-325 MG PO TABS
1.0000 | ORAL_TABLET | Freq: Once | ORAL | Status: AC
  Administered 2016-06-16: 1 via ORAL

## 2016-06-16 NOTE — ED Provider Notes (Signed)
CSN: [id number]     Arrival date & time 06/16/16  1413 History   First MD Initiated Contact with Patient 06/16/16 1427     Chief Complaint  Patient presents with  . Shortness of Breath     (Consider location/radiation/quality/duration/timing/severity/associated sxs/prior Treatment) Patient is a 82 y.o. female presenting with hip pain. The history is provided by the patient (Patient complains of left hip and back pain).  Hip Pain This is a recurrent problem. The current episode started more than 1 week ago. The problem occurs constantly. The problem has not changed since onset.Pertinent negatives include no chest pain, no abdominal pain and no headaches. Exacerbated by: Movement and back and hip. Nothing relieves the symptoms.    Past Medical History  Diagnosis Date  . Essential hypertension   . Arthritis   . Diverticulitis   . Type II diabetes mellitus ([place])   . H/O ventral hernia   . COPD (chronic obstructive pulmonary disease) ([place])   . Osteoporosis   . Adenocarcinoma of lung ([place])     Left lung 2009, resected  . Non-obstructive CAD     a. 04/2015 NSTEMI/Cath: LAD 10p, LCX 37m RCA 3106m20d, EF 35-40 w/ apical ballooning.  . Takotsubo cardiomyopathy     a. 04/2015 Echo: EF 45-50%, mid-dist anterior/apical/inferoapical HK w/ hyperdynamic base. Gr 1 DD, mild AI, mild-mod MR, triv TR, PASP 4838m;  b. 04/2015 LV gram: Ef 35-40% w/ apical ballooning.  . DMarland Kitchenslipidemia   . Ventricular bigeminy     a. 04/2015 in setting of NSTEMI/Takotsubo.  . CHF (congestive heart failure) ([facility] . Osteoporosis 11/04/2015    Managed by Dr. FanLegrand Rams. On home O2     3L N/C  . [facility]  Past Surgical History  Procedure Laterality Date  . Cholecystectomy    . Ectopic pregnancy surgery    . Abdominal hysterectomy    . Lung cancer surgery    . Incisional hernia repair N/A 08/26/2013    Procedure: HERNIA REPAIR INCISIONAL WITH MESH;  Surgeon: MarJamesetta SoD;  Location: [facility];   Service: General;  Laterality: N/A;  . Colonoscopy N/A 09/18/2014    Procedure: COLONOSCOPY;  Surgeon: SanDanie BinderD;  Location: [facility];  Service: Endoscopy;  Laterality: N/A;  8:30 AM - moved to 10:30 - CRosendo Gros notify pt  . Cardiac catheterization N/A 04/17/2015    Procedure: Left Heart Cath and Coronary Angiography;  Surgeon: ThoTroy SineD;  Location: [facility];  Service: Cardiovascular;  Laterality: N/A;   Family History  Problem Relation Age of Onset  . Diabetes Mother   . Hypertension Mother   . Diabetes Father   . Asthma    . Cancer    . Colon cancer Neg Hx   . Heart attack Sister     X2   Social History  Substance Use Topics  . Smoking status: Former Smoker -- 20 years    Types: Cigarettes    Quit date: 08/13/2006  . Smokeless tobacco: Never Used  . Alcohol Use: No   OB History    No data available     Review of Systems  Constitutional: Negative for appetite change and fatigue.  HENT: Negative for congestion, ear discharge and sinus pressure.   Eyes: Negative for discharge.  Respiratory: Negative for cough.   Cardiovascular: Negative for chest pain.  Gastrointestinal: Negative for abdominal pain and diarrhea.  Genitourinary: Negative for frequency  and hematuria.  Musculoskeletal: Negative for back pain.       Pain in lower back and left hip  Skin: Negative for rash.  Neurological: Negative for seizures and headaches.  Psychiatric/Behavioral: Negative for hallucinations.      Allergies  Lisinopril  Home Medications   Prior to Admission medications   Medication Sig Start Date End Date Taking? Authorizing Provider  benzonatate (TESSALON) 200 MG capsule Take 1 capsule by mouth 3 (three) times daily as needed. For cough 06/12/16  Yes Historical Provider, MD  furosemide (LASIX) 40 MG tablet Take 1 tablet by mouth daily, take 1 extra pill daily as needed for weight increase of 3 lbs in a day or 5 lbs in a week Patient taking  differently: Take 40 mg by mouth daily. *Take 1 tablet by mouth daily, take 1 extra pill daily as needed for weight increase of 3 lbs in a day or 5 lbs in a week 05/21/16  Yes Isaiah Serge, NP  loratadine (CLARITIN) 10 MG tablet Take 10 mg by mouth daily. 05/01/16  Yes Historical Provider, MD  nitroGLYCERIN (NITROSTAT) 0.4 MG SL tablet Place 0.4 mg under the tongue every 5 (five) minutes as needed for chest pain.   Yes Historical Provider, MD  omeprazole (PRILOSEC) 20 MG capsule Take 1 capsule (20 mg total) by mouth 2 (two) times daily. 06/12/16  Yes Isaiah Serge, NP  potassium chloride (K-DUR) 10 MEQ tablet Take 1 tablet (10 mEq total) by mouth daily. 05/21/16  Yes Isaiah Serge, NP  SYMBICORT 160-4.5 MCG/ACT inhaler Inhale 1 puff into the lungs 2 (two) times daily. 05/30/16  Yes Historical Provider, MD  albuterol (PROAIR HFA) 108 (90 BASE) MCG/ACT inhaler Inhale 2 puffs into the lungs every 4 (four) hours as needed for wheezing or shortness of breath.    Historical Provider, MD  alendronate (FOSAMAX) 70 MG tablet Take 70 mg by mouth once a week. On Fridays 03/14/15   Historical Provider, MD  aspirin EC 81 MG tablet Take 81 mg by mouth daily.    Historical Provider, MD  atorvastatin (LIPITOR) 80 MG tablet Take 1 tablet by mouth every morning. 06/30/15   Historical Provider, MD  B-D ULTRAFINE III SHORT PEN 31G X 8 MM MISC See admin instructions. 05/28/16   Historical Provider, MD  buPROPion (WELLBUTRIN SR) 150 MG 12 hr tablet Take 150 mg by mouth 2 (two) times daily.    Historical Provider, MD  Cholecalciferol (VITAMIN D-3) 1000 UNITS CAPS Take 1 capsule by mouth daily.    Historical Provider, MD  fish oil-omega-3 fatty acids 1000 MG capsule Take 1 g by mouth 2 (two) times daily.     Historical Provider, MD  gabapentin (NEURONTIN) 300 MG capsule Take 300 mg by mouth daily.     Historical Provider, MD  Garlic 563 MG TABS Take 300 mg by mouth daily.    Historical Provider, MD  HYDROcodone-acetaminophen  (NORCO/VICODIN) 5-325 MG tablet Take 1 tablet by mouth every 6 (six) hours as needed. 06/16/16   Milton Ferguson, MD  ipratropium-albuterol (DUONEB) 0.5-2.5 (3) MG/3ML SOLN Inhale 3 mLs into the lungs 4 (four) times daily.  09/05/14   Historical Provider, MD  LANTUS SOLOSTAR 100 UNIT/ML Solostar Pen Inject 15 Units into the skin daily at 10 pm.  05/21/16   Historical Provider, MD  linagliptin (TRADJENTA) 5 MG TABS tablet Take 5 mg by mouth daily.    Historical Provider, MD  losartan (COZAAR) 50 MG tablet Take 50 mg  by mouth daily. 06/29/15   Historical Provider, MD  metoprolol succinate (TOPROL-XL) 25 MG 24 hr tablet Take 1 tablet (25 mg total) by mouth daily. 06/26/15   Jettie Booze, MD  OXYGEN Inhale 3 L into the lungs continuous.    Historical Provider, MD  pyridoxine (B-6) 100 MG tablet Take 100 mg by mouth daily. Reported on 05/28/2016    Historical Provider, MD  rOPINIRole (REQUIP) 0.25 MG tablet Take 0.25 mg by mouth 3 (three) times daily.  09/05/14   Historical Provider, MD  sodium chloride (OCEAN) 0.65 % SOLN nasal spray Place 1 spray into both nostrils as needed for congestion.    Historical Provider, MD  traMADol (ULTRAM) 50 MG tablet Take 1 tablet by mouth every 4 (four) hours as needed. 06/03/16   Historical Provider, MD   BP 109/62 mmHg  Pulse 66  Temp(Src) 98 F (36.7 C) (Oral)  Resp 21  Ht '5\' 4"'$  (1.626 m)  Wt 178 lb (80.74 kg)  BMI 30.54 kg/m2  SpO2 100% Physical Exam  Constitutional: She is oriented to person, place, and time. She appears well-developed.  HENT:  Head: Normocephalic.  Eyes: Conjunctivae and EOM are normal. No scleral icterus.  Neck: Neck supple. No thyromegaly present.  Cardiovascular: Normal rate and regular rhythm.  Exam reveals no gallop and no friction rub.   No murmur heard. Pulmonary/Chest: No stridor. She has no wheezes. She has no rales. She exhibits no tenderness.  Abdominal: She exhibits no distension. There is no tenderness. There is no rebound.   Musculoskeletal: Normal range of motion. She exhibits no edema.  Tender lumbar spine tenderness over posterior left hip  Lymphadenopathy:    She has no cervical adenopathy.  Neurological: She is oriented to person, place, and time. She exhibits normal muscle tone. Coordination normal.  Skin: No rash noted. No erythema.  Psychiatric: She has a normal mood and affect. Her behavior is normal.    ED Course  Procedures (including critical care time) Labs Review Labs Reviewed - No data to display  Imaging Review Ct Hip Left Wo Contrast  06/16/2016  CLINICAL DATA:  Left hip pain for 3 weeks.  No known injury. EXAM: CT OF THE LEFT HIP WITHOUT CONTRAST TECHNIQUE: Multidetector CT imaging of the left hip was performed according to the standard protocol. Multiplanar CT image reconstructions were also generated. COMPARISON:  Radiographs dated 05/30/2016 FINDINGS: The patient has moderate osteoarthritis of the left hip with multiple subcortical cysts in the superior aspect of the acetabulum as well as diffuse thinning of the articular cartilage with spurring at the anterior aspect of the acetabulum. Minimal inferior osteophyte on the left femoral head. Femoral head is otherwise normal. Dystrophic calcifications in the origins of the left hamstring tendons from the left ischial tuberosity. Slight arthritic changes of the left sacroiliac joint. No fractures or bone destruction. IMPRESSION: Moderate osteoarthritis of the left hip. The arthritic changes primarily involve the superior aspect of the acetabulum. Electronically Signed   By: Lorriane Shire M.D.   On: 06/16/2016 16:43   I have personally reviewed and evaluated these images and lab results as part of my medical decision-making.   EKG Interpretation   Date/Time:  Monday June 16 2016 14:24:50 EDT Ventricular Rate:  82 PR Interval:    QRS Duration: 87 QT Interval:  386 QTC Calculation: 451 R Axis:   41 Text Interpretation:  Sinus rhythm  Abnormal R-wave progression, early  transition Minimal ST depression Confirmed by Phillipa Morden  MD, Marshel Golubski (  [id number])  on 06/16/2016 7:04:18 PM      MDM   Final diagnoses:  Hip pain, acute, left    CT scan shows significant osteoarthritis of left hip. Patient improved with pain medicine in the emergency department. She will be given a prescription of hydrocodone and will follow-up with her PCP    Milton Ferguson, MD 06/16/16 [phone]

## 2016-06-16 NOTE — Discharge Instructions (Signed)
Follow-up with your family doctor within a week ?

## 2016-06-16 NOTE — ED Notes (Signed)
Pt states she has been progressively more sob with left flank pain for about 3 weeks.

## 2016-06-16 NOTE — ED Notes (Signed)
EDP at bedside  

## 2016-06-17 DIAGNOSIS — M25552 Pain in left hip: Secondary | ICD-10-CM | POA: Diagnosis not present

## 2016-06-17 DIAGNOSIS — I10 Essential (primary) hypertension: Secondary | ICD-10-CM | POA: Diagnosis not present

## 2016-06-17 DIAGNOSIS — E119 Type 2 diabetes mellitus without complications: Secondary | ICD-10-CM | POA: Diagnosis not present

## 2016-06-17 DIAGNOSIS — N183 Chronic kidney disease, stage 3 (moderate): Secondary | ICD-10-CM | POA: Diagnosis not present

## 2016-06-17 DIAGNOSIS — J449 Chronic obstructive pulmonary disease, unspecified: Secondary | ICD-10-CM | POA: Diagnosis not present

## 2016-06-17 DIAGNOSIS — N390 Urinary tract infection, site not specified: Secondary | ICD-10-CM | POA: Diagnosis not present

## 2016-06-18 ENCOUNTER — Other Ambulatory Visit: Payer: Medicare Other

## 2016-06-18 DIAGNOSIS — C3490 Malignant neoplasm of unspecified part of unspecified bronchus or lung: Secondary | ICD-10-CM | POA: Diagnosis not present

## 2016-06-18 DIAGNOSIS — E1122 Type 2 diabetes mellitus with diabetic chronic kidney disease: Secondary | ICD-10-CM | POA: Diagnosis not present

## 2016-06-18 DIAGNOSIS — I129 Hypertensive chronic kidney disease with stage 1 through stage 4 chronic kidney disease, or unspecified chronic kidney disease: Secondary | ICD-10-CM | POA: Diagnosis not present

## 2016-06-18 DIAGNOSIS — Z9981 Dependence on supplemental oxygen: Secondary | ICD-10-CM | POA: Diagnosis not present

## 2016-06-18 DIAGNOSIS — Z7951 Long term (current) use of inhaled steroids: Secondary | ICD-10-CM | POA: Diagnosis not present

## 2016-06-18 DIAGNOSIS — N183 Chronic kidney disease, stage 3 (moderate): Secondary | ICD-10-CM | POA: Diagnosis not present

## 2016-06-18 DIAGNOSIS — I509 Heart failure, unspecified: Secondary | ICD-10-CM | POA: Diagnosis not present

## 2016-06-18 DIAGNOSIS — J441 Chronic obstructive pulmonary disease with (acute) exacerbation: Secondary | ICD-10-CM | POA: Diagnosis not present

## 2016-06-18 DIAGNOSIS — Z7982 Long term (current) use of aspirin: Secondary | ICD-10-CM | POA: Diagnosis not present

## 2016-06-23 ENCOUNTER — Other Ambulatory Visit: Payer: Self-pay | Admitting: *Deleted

## 2016-06-23 ENCOUNTER — Encounter: Payer: Self-pay | Admitting: *Deleted

## 2016-06-23 NOTE — Patient Outreach (Signed)
Call to patient Patient reports she is doing pretty well. States her blood sugars are back to below 200 again, today it was 166. States her weight is stable, weight today was 176# Patient confirms she is enrolled in [facility] HF Case management program. Patient states she did have xray of her hip due to pain, they are sending her to a orthopaedic MD to follow up as they cannot find reason for pain. Denies any questions or concerns for RNCM  RNCM discussed [facility] care mangement case closure, patient agrees that she has case management through [facility] and would like to close [facility] program services at this time. RNCM discussed she would get case closure letter and [facility] contact if she feels she could use program services in the future.  Plan to close case per protocol. Will send case closure letter to MD and patient.  Royetta Crochet. Laymond Purser, RN, BSN, [place] [phone]

## 2016-06-24 DIAGNOSIS — J449 Chronic obstructive pulmonary disease, unspecified: Secondary | ICD-10-CM | POA: Diagnosis not present

## 2016-06-26 DIAGNOSIS — L110 Acquired keratosis follicularis: Secondary | ICD-10-CM | POA: Diagnosis not present

## 2016-06-26 DIAGNOSIS — E1151 Type 2 diabetes mellitus with diabetic peripheral angiopathy without gangrene: Secondary | ICD-10-CM | POA: Diagnosis not present

## 2016-06-26 DIAGNOSIS — B351 Tinea unguium: Secondary | ICD-10-CM | POA: Diagnosis not present

## 2016-06-26 DIAGNOSIS — E1140 Type 2 diabetes mellitus with diabetic neuropathy, unspecified: Secondary | ICD-10-CM | POA: Diagnosis not present

## 2016-06-27 DIAGNOSIS — M25552 Pain in left hip: Secondary | ICD-10-CM | POA: Diagnosis not present

## 2016-07-09 DIAGNOSIS — I509 Heart failure, unspecified: Secondary | ICD-10-CM | POA: Diagnosis not present

## 2016-07-09 DIAGNOSIS — R490 Dysphonia: Secondary | ICD-10-CM | POA: Diagnosis not present

## 2016-07-09 DIAGNOSIS — J383 Other diseases of vocal cords: Secondary | ICD-10-CM | POA: Diagnosis not present

## 2016-07-09 DIAGNOSIS — K219 Gastro-esophageal reflux disease without esophagitis: Secondary | ICD-10-CM | POA: Diagnosis not present

## 2016-07-25 DIAGNOSIS — J449 Chronic obstructive pulmonary disease, unspecified: Secondary | ICD-10-CM | POA: Diagnosis not present

## 2016-09-21 ENCOUNTER — Emergency Department (HOSPITAL_COMMUNITY): Payer: Medicare Other

## 2016-09-21 ENCOUNTER — Emergency Department (HOSPITAL_COMMUNITY)
Admission: EM | Admit: 2016-09-21 | Discharge: 2016-09-21 | Disposition: A | Discharge status: Home / Self Care | Payer: Medicare Other | Attending: Emergency Medicine | Admitting: Emergency Medicine

## 2016-09-21 ENCOUNTER — Encounter (HOSPITAL_COMMUNITY): Payer: Self-pay

## 2016-09-21 DIAGNOSIS — G20 Parkinson's disease: Secondary | ICD-10-CM | POA: Diagnosis not present

## 2016-09-21 DIAGNOSIS — N183 Chronic kidney disease, stage 3 (moderate): Secondary | ICD-10-CM | POA: Diagnosis not present

## 2016-09-21 DIAGNOSIS — J40 Bronchitis, not specified as acute or chronic: Secondary | ICD-10-CM | POA: Diagnosis not present

## 2016-09-21 DIAGNOSIS — J449 Chronic obstructive pulmonary disease, unspecified: Secondary | ICD-10-CM | POA: Diagnosis not present

## 2016-09-21 DIAGNOSIS — I130 Hypertensive heart and chronic kidney disease with heart failure and stage 1 through stage 4 chronic kidney disease, or unspecified chronic kidney disease: Secondary | ICD-10-CM | POA: Insufficient documentation

## 2016-09-21 DIAGNOSIS — I509 Heart failure, unspecified: Secondary | ICD-10-CM | POA: Diagnosis not present

## 2016-09-21 DIAGNOSIS — I252 Old myocardial infarction: Secondary | ICD-10-CM | POA: Insufficient documentation

## 2016-09-21 DIAGNOSIS — Z79899 Other long term (current) drug therapy: Secondary | ICD-10-CM | POA: Insufficient documentation

## 2016-09-21 DIAGNOSIS — Z87891 Personal history of nicotine dependence: Secondary | ICD-10-CM | POA: Diagnosis not present

## 2016-09-21 DIAGNOSIS — Z7982 Long term (current) use of aspirin: Secondary | ICD-10-CM | POA: Insufficient documentation

## 2016-09-21 DIAGNOSIS — Z85118 Personal history of other malignant neoplasm of bronchus and lung: Secondary | ICD-10-CM | POA: Insufficient documentation

## 2016-09-21 DIAGNOSIS — E1122 Type 2 diabetes mellitus with diabetic chronic kidney disease: Secondary | ICD-10-CM | POA: Diagnosis not present

## 2016-09-21 DIAGNOSIS — R0602 Shortness of breath: Secondary | ICD-10-CM | POA: Diagnosis present

## 2016-09-21 LAB — CBC WITH DIFFERENTIAL/PLATELET
Basophils Absolute: 0 10*3/uL (ref 0.0–0.1)
Basophils Relative: 0 %
Eosinophils Absolute: 0.3 10*3/uL (ref 0.0–0.7)
Eosinophils Relative: 5 %
HCT: 38.3 % (ref 36.0–46.0)
Hemoglobin: 12 g/dL (ref 12.0–15.0)
Lymphocytes Relative: 22 %
Lymphs Abs: 1.5 10*3/uL (ref 0.7–4.0)
MCH: 29.9 pg (ref 26.0–34.0)
MCHC: 31.3 g/dL (ref 30.0–36.0)
MCV: 95.3 fL (ref 78.0–100.0)
Monocytes Absolute: 0.6 10*3/uL (ref 0.1–1.0)
Monocytes Relative: 9 %
Neutro Abs: 4.6 10*3/uL (ref 1.7–7.7)
Neutrophils Relative %: 64 %
Platelets: 199 10*3/uL (ref 150–400)
RBC: 4.02 MIL/uL (ref 3.87–5.11)
RDW: 14.2 % (ref 11.5–15.5)
WBC: 7.1 10*3/uL (ref 4.0–10.5)

## 2016-09-21 LAB — BASIC METABOLIC PANEL
Anion gap: 5 (ref 5–15)
BUN: 17 mg/dL (ref 6–20)
CO2: 30 mmol/L (ref 22–32)
Calcium: 9.6 mg/dL (ref 8.9–10.3)
Chloride: 103 mmol/L (ref 101–111)
Creatinine, Ser: 1.43 mg/dL — ABNORMAL HIGH (ref 0.44–1.00)
GFR calc Af Amer: 39 mL/min — ABNORMAL LOW (ref >60)
GFR calc non Af Amer: 34 mL/min — ABNORMAL LOW (ref >60)
Glucose, Bld: 162 mg/dL — ABNORMAL HIGH (ref 65–99)
Potassium: 4.9 mmol/L (ref 3.5–5.1)
Sodium: 138 mmol/L (ref 135–145)

## 2016-09-21 LAB — HEPATIC FUNCTION PANEL
ALT: 19 U/L (ref 14–54)
AST: 20 U/L (ref 15–41)
Albumin: 3.8 g/dL (ref 3.5–5.0)
Alkaline Phosphatase: 59 U/L (ref 38–126)
Bilirubin, Direct: <0.1 mg/dL — ABNORMAL LOW (ref 0.1–0.5)
Total Bilirubin: 0.5 mg/dL (ref 0.3–1.2)
Total Protein: 7.3 g/dL (ref 6.5–8.1)

## 2016-09-21 LAB — TROPONIN I: Troponin I: <0.03 ng/mL (ref <0.03)

## 2016-09-21 LAB — BRAIN NATRIURETIC PEPTIDE: B Natriuretic Peptide: 23 pg/mL (ref 0.0–100.0)

## 2016-09-21 MED ORDER — AZITHROMYCIN 250 MG PO TABS: ORAL_TABLET | ORAL | 0 refills | Status: DC

## 2016-09-21 NOTE — ED Provider Notes (Signed)
[facility] DEPT Provider Note   CSN: [id number] Arrival date & time: 09/21/16  1132   By signing my name below, I, Angela Lawson, attest that this documentation has been prepared under the direction and in the presence of Angela Ferguson, MD  Electronically Signed: Delton Lawson, ED Scribe. 09/21/16. 11:50 AM.   History   Chief Complaint Chief Complaint  Patient presents with  . Shortness of Breath    Patient complains of a cough and some shortness of breath today. Mild productive cough   The history is provided by the patient. No language interpreter was used.  Shortness of Breath  This is a recurrent problem. The problem occurs intermittently.The current episode started yesterday. The problem has been gradually worsening. Associated symptoms include cough and wheezing. Pertinent negatives include no headaches, no chest pain, no abdominal pain, no rash and no leg swelling. Precipitated by: Unknown. Risk factors: COPD. She has had prior ED visits. Associated medical issues include COPD.   HPI Comments:  Angela Lawson is a 82 y.o. female, with a hx of COPD, who presents to the Emergency Department complaining of worsening SOB and wheezing x 1 day. Associated symptoms include intermittent productive cough with clear phlegm. Pt denies any fevers, BLE swelling, weight gain, or any pain. She uses 3 L of oxygen at home. Pt denies any other complaints at this time.  Past Medical History:  Diagnosis Date  . Adenocarcinoma of lung ([place])    Left lung 2009, resected  . Arthritis   . CHF (congestive heart failure) ([place])   . COPD (chronic obstructive pulmonary disease) ([place])   . Diverticulitis   . Dyslipidemia   . Essential hypertension   . H/O ventral hernia   . Non-obstructive CAD    a. 04/2015 NSTEMI/Cath: LAD 10p, LCX 53m RCA 352m20d, EF 35-40 w/ apical ballooning.  . On home O2    3L N/C  . Osteoporosis   . Osteoporosis 11/04/2015   Managed by Dr. FaLegrand Rams . Parkinson's disease  ([facility]  . Takotsubo cardiomyopathy    a. 04/2015 Echo: EF 45-50%, mid-dist anterior/apical/inferoapical HK w/ hyperdynamic base. Gr 1 DD, mild AI, mild-mod MR, triv TR, PASP 4858m;  b. 04/2015 LV gram: Ef 35-40% w/ apical ballooning.  . Type II diabetes mellitus ([facility] . Ventricular bigeminy    a. 04/2015 in setting of NSTEMI/Takotsubo.    Patient Active Problem List   Diagnosis Date Noted  . COPD with acute exacerbation (HCCCact[place]5/19/2017  . Acute on chronic respiratory failure with hypoxia (HCCLamar5/19/2017  . SOB (shortness of breath)   . Osteoporosis 11/04/2015  . CHF (congestive heart failure) (HCC[place]8/24/2016  . CHF exacerbation ([place]CC[place]8/12/2014  . Essential hypertension 04/19/2015  . Type II diabetes mellitus (HCCMal[place]5/11/2015  . Dyslipidemia 04/19/2015  . History of lung cancer 04/19/2015  . Takotsubo cardiomyopathy 04/19/2015  . Ventricular bigeminy 04/19/2015  . NSTEMI (non-ST elevated myocardial infarction) (HCCTom [place]5/11/2015  . Stress-induced cardiomyopathy 04/18/2015  . Elevated troponin 04/16/2015  . COPD exacerbation ([facility]CCF[place]ers[place]5/08/2015  . CKD (chronic kidney disease) stage 3, GFR 30-59 ml/min 04/16/2015  . Herpes genitalis in women 11/24/2014  . Toe fracture 09/28/2014  . Non-traumatic tear of right rotator cuff 11/30/2013  . Pain in joint, shoulder region 11/30/2013  . Muscle weakness (generalized) 11/30/2013  . Bursitis of left shoulder 02/09/2013  . Adenocarcinoma of lung (HCCEast Petersburg9/13/2013    Past Surgical History:  Procedure Laterality Date  . ABDOMINAL HYSTERECTOMY    .  CARDIAC CATHETERIZATION N/A 04/17/2015   Procedure: Left Heart Cath and Coronary Angiography;  Surgeon: Troy Sine, MD;  Location: [facility] CV LAB;  Service: Cardiovascular;  Laterality: N/A;  . CHOLECYSTECTOMY    . COLONOSCOPY N/A 09/18/2014   Procedure: COLONOSCOPY;  Surgeon: Danie Binder, MD;  [facility];  Service: Endoscopy;  Laterality: N/A;  8:30 AM - moved to 10:30 Angela Lawson to notify pt  . ECTOPIC PREGNANCY SURGERY    . INCISIONAL HERNIA REPAIR N/A 08/26/2013   Procedure: HERNIA REPAIR INCISIONAL WITH MESH;  Surgeon: Jamesetta So, MD;  Location: [facility] ORS;  Service: General;  Laterality: N/A;  . LUNG CANCER SURGERY      OB History    No data available       Home Medications    Prior to Admission medications   Medication Sig Start Date End Date Taking? Authorizing Provider  albuterol (PROAIR HFA) 108 (90 BASE) MCG/ACT inhaler Inhale 2 puffs into the lungs every 4 (four) hours as needed for wheezing or shortness of breath.    Historical Provider, MD  alendronate (FOSAMAX) 70 MG tablet Take 70 mg by mouth once a week. On Fridays 03/14/15   Historical Provider, MD  aspirin EC 81 MG tablet Take 81 mg by mouth daily.    Historical Provider, MD  atorvastatin (LIPITOR) 80 MG tablet Take 1 tablet by mouth every morning. 06/30/15   Historical Provider, MD  B-D ULTRAFINE III SHORT PEN 31G X 8 MM MISC See admin instructions. 05/28/16   Historical Provider, MD  benzonatate (TESSALON) 200 MG capsule Take 1 capsule by mouth 3 (three) times daily as needed. For cough 06/12/16   Historical Provider, MD  buPROPion (WELLBUTRIN SR) 150 MG 12 hr tablet Take 150 mg by mouth 2 (two) times daily.    Historical Provider, MD  Cholecalciferol (VITAMIN D-3) 1000 UNITS CAPS Take 1 capsule by mouth daily.    Historical Provider, MD  fish oil-omega-3 fatty acids 1000 MG capsule Take 1 g by mouth 2 (two) times daily.     Historical Provider, MD  furosemide (LASIX) 40 MG tablet Take 1 tablet by mouth daily, take 1 extra pill daily as needed for weight increase of 3 lbs in a day or 5 lbs in a week Patient taking differently: Take 40 mg by mouth daily. *Take 1 tablet by mouth daily, take 1 extra pill daily as needed for weight increase of 3 lbs in a day or 5 lbs in a week 05/21/16   Angela Serge, NP  gabapentin (NEURONTIN) 300 MG capsule Take 300 mg by mouth daily.     Historical Provider,  MD  Garlic 557 MG TABS Take 300 mg by mouth daily.    Historical Provider, MD  HYDROcodone-acetaminophen (NORCO/VICODIN) 5-325 MG tablet Take 1 tablet by mouth every 6 (six) hours as needed. 06/16/16   Angela Ferguson, MD  ipratropium-albuterol (DUONEB) 0.5-2.5 (3) MG/3ML SOLN Inhale 3 mLs into the lungs 4 (four) times daily.  09/05/14   Historical Provider, MD  LANTUS SOLOSTAR 100 UNIT/ML Solostar Pen Inject 15 Units into the skin daily at 10 pm.  05/21/16   Historical Provider, MD  linagliptin (TRADJENTA) 5 MG TABS tablet Take 5 mg by mouth daily.    Historical Provider, MD  loratadine (CLARITIN) 10 MG tablet Take 10 mg by mouth daily. 05/01/16   Historical Provider, MD  losartan (COZAAR) 50 MG tablet Take 50 mg by mouth daily. 06/29/15   Historical  Provider, MD  metoprolol succinate (TOPROL-XL) 25 MG 24 hr tablet Take 1 tablet (25 mg total) by mouth daily. 06/26/15   Jettie Booze, MD  nitroGLYCERIN (NITROSTAT) 0.4 MG SL tablet Place 0.4 mg under the tongue every 5 (five) minutes as needed for chest pain.    Historical Provider, MD  omeprazole (PRILOSEC) 20 MG capsule Take 1 capsule (20 mg total) by mouth 2 (two) times daily. 06/12/16   Angela Serge, NP  OXYGEN Inhale 3 L into the lungs continuous.    Historical Provider, MD  potassium chloride (K-DUR) 10 MEQ tablet Take 1 tablet (10 mEq total) by mouth daily. 05/21/16   Angela Serge, NP  pyridoxine (B-6) 100 MG tablet Take 100 mg by mouth daily. Reported on 05/28/2016    Historical Provider, MD  rOPINIRole (REQUIP) 0.25 MG tablet Take 0.25 mg by mouth 3 (three) times daily.  09/05/14   Historical Provider, MD  sodium chloride (OCEAN) 0.65 % SOLN nasal spray Place 1 spray into both nostrils as needed for congestion.    Historical Provider, MD  SYMBICORT 160-4.5 MCG/ACT inhaler Inhale 1 puff into the lungs 2 (two) times daily. 05/30/16   Historical Provider, MD  traMADol (ULTRAM) 50 MG tablet Take 1 tablet by mouth every 4 (four) hours as needed.  06/03/16   Historical Provider, MD    Family History Family History  Problem Relation Age of Onset  . Diabetes Mother   . Hypertension Mother   . Diabetes Father   . Asthma    . Cancer    . Heart attack Sister     X2  . Colon cancer Neg Hx     Social History Social History  Substance Use Topics  . Smoking status: Former Smoker    Years: 20.00    Types: Cigarettes    Quit date: 08/13/2006  . Smokeless tobacco: Never Used  . Alcohol use No     Allergies   Lisinopril   Review of Systems Review of Systems  Constitutional: Negative for appetite change, fatigue and unexpected weight change.  HENT: Negative for congestion, ear discharge and sinus pressure.   Eyes: Negative for discharge.  Respiratory: Positive for cough, shortness of breath and wheezing.   Cardiovascular: Negative for chest pain and leg swelling.  Gastrointestinal: Negative for abdominal pain and diarrhea.  Genitourinary: Negative for frequency and hematuria.  Musculoskeletal: Negative for back pain.  Skin: Negative for rash.  Neurological: Negative for seizures and headaches.  Psychiatric/Behavioral: Negative for hallucinations.     Physical Exam Updated Vital Signs Ht '5\' 4"'$  (1.626 m)   Wt 175 lb (79.4 kg)   BMI 30.04 kg/m   Physical Exam  Constitutional: She is oriented to person, place, and time. She appears well-developed.  HENT:  Head: Normocephalic.  Eyes: Conjunctivae and EOM are normal. No scleral icterus.  Neck: Neck supple. No thyromegaly present.  Cardiovascular: Normal rate and regular rhythm.  Exam reveals no gallop and no friction rub.   No murmur heard. Pulmonary/Chest: No stridor. Tachypnea noted. She has no wheezes. She has no rales. She exhibits no tenderness.  Abdominal: She exhibits no distension. There is no tenderness. There is no rebound.  Musculoskeletal: Normal range of motion. She exhibits no edema.  Lymphadenopathy:    She has no cervical adenopathy.  Neurological:  She is oriented to person, place, and time. She exhibits normal muscle tone. Coordination normal.  Skin: No rash noted. No erythema.  Psychiatric: She has a normal mood  and affect. Her behavior is normal.     ED Treatments / Results  DIAGNOSTIC STUDIES:  Oxygen Saturation is 97% on [place], normal by my interpretation.    COORDINATION OF CARE:  11:47 AM Discussed treatment plan with pt at bedside and pt agreed to plan.  Labs (all labs ordered are listed, but only abnormal results are displayed) Labs Reviewed  CBC WITH DIFFERENTIAL/PLATELET  BASIC METABOLIC PANEL    EKG  EKG Interpretation None       Radiology No results found.  Procedures Procedures (including critical care time)  Medications Ordered in ED Medications - No data to display   Initial Impression / Assessment and Plan / ED Course  I have reviewed the triage vital signs and the nursing notes.  Pertinent labs & imaging results that were available during my care of the patient were reviewed by me and considered in my medical decision making (see chart for details).  Clinical Course    Labs and x-ray unremarkable. Suspect bronchitis with mild exacerbation of COPD. Patient will be put on Z-Pak will follow-up with her family doctor  Final Clinical Impressions(s) / ED Diagnoses   Final diagnoses:  None    New Prescriptions New Prescriptions   No medications on file  The chart was scribed for me under my direct supervision.  I personally performed the history, physical, and medical decision making and all procedures in the evaluation of this patient.Angela Ferguson, MD 09/21/16 1346

## 2016-09-21 NOTE — Discharge Instructions (Signed)
Follow-up with your family doctor this week for recheck 

## 2016-09-21 NOTE — ED Triage Notes (Signed)
Pt reports has copd and is on 3 liters of 02 at home.  Reports worsening sob and wheezing yesterday.  Reports cough, productive at times.  Denies pain or fever.  Denies any swelling in extremities.

## 2016-10-01 ENCOUNTER — Encounter: Payer: Self-pay | Admitting: Interventional Cardiology

## 2016-10-01 ENCOUNTER — Ambulatory Visit (INDEPENDENT_AMBULATORY_CARE_PROVIDER_SITE_OTHER): Payer: Medicare Other | Admitting: Interventional Cardiology

## 2016-10-01 VITALS — BP 110/60 | HR 77 | Ht 64.0 in | Wt 178.4 lb

## 2016-10-01 DIAGNOSIS — I5181 Takotsubo syndrome: Secondary | ICD-10-CM

## 2016-10-01 DIAGNOSIS — N183 Chronic kidney disease, stage 3 unspecified: Secondary | ICD-10-CM

## 2016-10-01 DIAGNOSIS — R0600 Dyspnea, unspecified: Secondary | ICD-10-CM

## 2016-10-01 DIAGNOSIS — R0609 Other forms of dyspnea: Secondary | ICD-10-CM

## 2016-10-01 DIAGNOSIS — E785 Hyperlipidemia, unspecified: Secondary | ICD-10-CM

## 2016-10-01 DIAGNOSIS — R490 Dysphonia: Secondary | ICD-10-CM | POA: Diagnosis not present

## 2016-10-01 LAB — LIPID PANEL
Cholesterol: 91 mg/dL — ABNORMAL LOW (ref 125–200)
HDL: 41 mg/dL — ABNORMAL LOW (ref >=46)
LDL Cholesterol: 32 mg/dL (ref <130)
Total CHOL/HDL Ratio: 2.2 Ratio (ref <=5.0)
Triglycerides: 92 mg/dL (ref <150)
VLDL: 18 mg/dL (ref <30)

## 2016-10-01 LAB — COMPREHENSIVE METABOLIC PANEL
ALT: 15 U/L (ref 6–29)
AST: 18 U/L (ref 10–35)
Albumin: 4 g/dL (ref 3.6–5.1)
Alkaline Phosphatase: 57 U/L (ref 33–130)
BUN: 15 mg/dL (ref 7–25)
CO2: 30 mmol/L (ref 20–31)
Calcium: 9.6 mg/dL (ref 8.6–10.4)
Chloride: 102 mmol/L (ref 98–110)
Creat: 1.51 mg/dL — ABNORMAL HIGH (ref 0.60–0.88)
Glucose, Bld: 88 mg/dL (ref 65–99)
Potassium: 4.1 mmol/L (ref 3.5–5.3)
Sodium: 140 mmol/L (ref 135–146)
Total Bilirubin: 0.4 mg/dL (ref 0.2–1.2)
Total Protein: 6.7 g/dL (ref 6.1–8.1)

## 2016-10-01 MED ORDER — OMEPRAZOLE 20 MG PO CPDR: 20.0000 mg | DELAYED_RELEASE_CAPSULE | Freq: Every day | ORAL | 3 refills | Status: DC

## 2016-10-01 NOTE — Progress Notes (Signed)
Cardiology Office Note   Date:  10/01/2016   ID:  Angela Lawson, DOB 10-Jul-1934, MRN [id number]  PCP:  Rosita Fire, MD    No chief complaint on file. f/u cardiomyopathy   Wt Readings from Last 3 Encounters:  09/21/16 79.4 kg (175 lb)  06/16/16 80.7 kg (178 lb)  06/12/16 81.2 kg (179 lb 1.9 oz)       History of Present Illness: Angela Lawson is a 82 y.o. female  Who had an episode of stress-induced cardiomyopathy in May 2016. She underwent cardiac catheterization at that time showing no significant coronary artery disease. She had a repeat echocardiogram in June 2016 showing normalization of her left ventricular ejection fraction. Overall, she is feeling well. She is using oxygen more frequently. She had been on oxygen before her cardiac issues due to COPD. She denies any swelling. She sleeps on 2-3 pillows a day. This is stable for her. No ankle edema.  COntinues to have multiple joint pains.    No chest pain. No problems with SOB, she has used the prn lasix once for wt gain and wt came down.  She continues to watch the salt in her diet.  BP is low but no lightheadedness or dizziness.      Past Medical History:  Diagnosis Date  . Adenocarcinoma of lung ([place])    Left lung 2009, resected  . Arthritis   . CHF (congestive heart failure) ([place])   . COPD (chronic obstructive pulmonary disease) ([place])   . Diverticulitis   . Dyslipidemia   . Essential hypertension   . H/O ventral hernia   . Non-obstructive CAD    a. 04/2015 NSTEMI/Cath: LAD 10p, LCX 41m RCA 319m20d, EF 35-40 w/ apical ballooning.  . On home O2    3L N/C  . Osteoporosis   . Osteoporosis 11/04/2015   Managed by Dr. FaLegrand Rams . Parkinson's disease ([facility]  . Takotsubo cardiomyopathy    a. 04/2015 Echo: EF 45-50%, mid-dist anterior/apical/inferoapical HK w/ hyperdynamic base. Gr 1 DD, mild AI, mild-mod MR, triv TR, PASP 4860m;  b. 04/2015 LV gram: Ef 35-40% w/ apical ballooning.  . Type II diabetes mellitus  ([facility] . Ventricular bigeminy    a. 04/2015 in setting of NSTEMI/Takotsubo.    Past Surgical History:  Procedure Laterality Date  . ABDOMINAL HYSTERECTOMY    . CARDIAC CATHETERIZATION N/A 04/17/2015   Procedure: Left Heart Cath and Coronary Angiography;  Surgeon: ThoTroy SineD;  Location: [facility];  Service: Cardiovascular;  Laterality: N/A;  . CHOLECYSTECTOMY    . COLONOSCOPY N/A 09/18/2014   Procedure: COLONOSCOPY;  Surgeon: SanDanie BinderD;  [facility];  Service: Endoscopy;  Laterality: N/A;  8:30 AM - moved to 10:30 - CRosendo Gros notify pt  . ECTOPIC PREGNANCY SURGERY    . INCISIONAL HERNIA REPAIR N/A 08/26/2013   Procedure: HERNIA REPAIR INCISIONAL WITH MESH;  Surgeon: MarJamesetta SoD;  Location: [facility];  Service: General;  Laterality: N/A;  . LUNG CANCER SURGERY       Current Outpatient Prescriptions  Medication Sig Dispense Refill  . albuterol (PROAIR HFA) 108 (90 BASE) MCG/ACT inhaler Inhale 2 puffs into the lungs every 4 (four) hours as needed for wheezing or shortness of breath.    . aMarland Kitchenendronate (FOSAMAX) 70 MG tablet Take 70 mg by mouth once a week. On Fridays    . aspirin EC 81 MG tablet Take 81 mg  by mouth daily.    Marland Kitchen atorvastatin (LIPITOR) 80 MG tablet Take 1 tablet by mouth every morning.    . B-D ULTRAFINE III SHORT PEN 31G X 8 MM MISC See admin instructions.  3  . benzonatate (TESSALON) 200 MG capsule Take 1 capsule by mouth 3 (three) times daily as needed. For cough  4  . buPROPion (WELLBUTRIN SR) 150 MG 12 hr tablet Take 150 mg by mouth 2 (two) times daily.    . Cholecalciferol (VITAMIN D-3) 1000 UNITS CAPS Take 1 capsule by mouth daily.    . fish oil-omega-3 fatty acids 1000 MG capsule Take 1 g by mouth 2 (two) times daily.     . furosemide (LASIX) 40 MG tablet Take 1 tablet by mouth daily, take 1 extra pill daily as needed for weight increase of 3 lbs in a day or 5 lbs in a week (Patient taking differently: Take 40 mg by mouth daily.  *Take 1 tablet by mouth daily, take 1 extra pill daily as needed for weight increase of 3 lbs in a day or 5 lbs in a week) 45 tablet 3  . gabapentin (NEURONTIN) 300 MG capsule Take 300 mg by mouth daily.     . Garlic 676 MG TABS Take 300 mg by mouth daily.    Marland Kitchen ipratropium-albuterol (DUONEB) 0.5-2.5 (3) MG/3ML SOLN Inhale 3 mLs into the lungs 4 (four) times daily.     Marland Kitchen LANTUS SOLOSTAR 100 UNIT/ML Solostar Pen Inject 15 Units into the skin daily at 10 pm.     . linagliptin (TRADJENTA) 5 MG TABS tablet Take 5 mg by mouth daily.    Marland Kitchen loratadine (CLARITIN) 10 MG tablet Take 10 mg by mouth daily.  3  . losartan (COZAAR) 50 MG tablet Take 50 mg by mouth daily.  2  . metoprolol succinate (TOPROL-XL) 25 MG 24 hr tablet Take 1 tablet (25 mg total) by mouth daily. 90 tablet 3  . nitroGLYCERIN (NITROSTAT) 0.4 MG SL tablet Place 0.4 mg under the tongue every 5 (five) minutes as needed for chest pain.    Marland Kitchen omeprazole (PRILOSEC) 20 MG capsule Take 1 capsule (20 mg total) by mouth 2 (two) times daily. 60 capsule 3  . OXYGEN Inhale 3 L into the lungs continuous.    . potassium chloride (K-DUR) 10 MEQ tablet Take 1 tablet (10 mEq total) by mouth daily. 90 tablet 3  . pyridoxine (B-6) 100 MG tablet Take 100 mg by mouth daily. Reported on 05/28/2016    . rOPINIRole (REQUIP) 0.25 MG tablet Take 0.25 mg by mouth 3 (three) times daily.     . sodium chloride (OCEAN) 0.65 % SOLN nasal spray Place 1 spray into both nostrils as needed for congestion.    . traMADol (ULTRAM) 50 MG tablet Take 1 tablet by mouth every 4 (four) hours as needed for moderate pain.      No current facility-administered medications for this visit.     Allergies:   Lisinopril    Social History:  The patient  reports that she quit smoking about 10 years ago. Her smoking use included Cigarettes. She quit after 20.00 years of use. She has never used smokeless tobacco. She reports that she does not drink alcohol or use drugs.   Family History:   The patient's family history includes Diabetes in her father and mother; Heart attack in her sister; Hypertension in her mother.    ROS:  Please see the history of present illness.  Otherwise, review of systems are positive for DOE-chronci.   All other systems are reviewed and negative.    PHYSICAL EXAM: VS:  There were no vitals taken for this visit. , BMI There is no height or weight on file to calculate BMI. GEN: Well nourished, well developed, in no acute distress , on oxygen HEENT: normal  Neck: no JVD, carotid bruits, or masses Cardiac: RRR; no murmurs, rubs, or gallops,no edema  Respiratory:  clear to auscultation bilaterally, normal work of breathing GI: soft, nontender, nondistended, + BS MS: no deformity or atrophy  Skin: warm and dry, no rash Neuro:  Strength and sensation are intact Psych: euthymic mood, full affect    Recent Labs: 09/21/2016: ALT 19; B Natriuretic Peptide 23.0; BUN 17; Creatinine, Ser 1.43; Hemoglobin 12.0; Platelets 199; Potassium 4.9; Sodium 138   Lipid Panel    Component Value Date/Time   CHOL 157 04/17/2015 0800   TRIG 87 04/17/2015 0800   HDL 59 04/17/2015 0800   CHOLHDL 2.7 04/17/2015 0800   VLDL 17 04/17/2015 0800   LDLCALC 81 04/17/2015 0800     Other studies Reviewed: Additional studies/ records that were reviewed today with results demonstrating: EF normal in June 2016.   ASSESSMENT AND PLAN:  1. Stress-induced cardiomyopathy: Left ventricular ejection fraction is normalized. Continue ARB and beta blocker. She does have diastolic dysfunction. Continue salt restriction. 2. Hyperlipidemia: She was placed on high-dose atorvastatin due to presumed non-ST elevation MI in 2016. She is diabetic and at higher risk of coronary artery disease. High potency statin is reasonable, although she may not need the full 80 mg daily. I defered changing dose of her statin to her primary care doctor after they check lipids in August 2016.  These results  are not available to me and she is not sure labs were checked.  WIll check labs today. 3. COPD/ history of lung cancer: Continue supplemental oxygen.  4. Hoarseness: Better with course of BID prilosec.  Decrease to once a day.   5. CKD:  Check electrolytes.   Current medicines are reviewed at length with the patient today.  The patient concerns regarding her medicines were addressed.  The following changes have been made:  No change  Labs/ tests ordered today include:  No orders of the defined types were placed in this encounter.   Recommend 150 minutes/week of aerobic exercise Low fat, low carb, high fiber diet recommended  Disposition:   FU prn; she prefers the [place] office if she requires cardiology f/u in the future.   Signed, Angela Grooms, MD  10/01/2016 1:36 PM    [facility] [place], [place], [place]  [address] Phone: [phone]; Fax: [phone]

## 2016-10-01 NOTE — Patient Instructions (Signed)
**Note De-Identified [name] Obfuscation** Medication Instructions:  Decrease Prilosec to once daily. All other medications remain the same.  Labwork: Today-Lipids ans CMET  Testing/Procedures: None  Follow-Up: As needed     If you need a refill on your cardiac medications before your next appointment, please call your pharmacy.

## 2016-10-02 ENCOUNTER — Telehealth: Payer: Self-pay | Admitting: *Deleted

## 2016-10-02 DIAGNOSIS — E785 Hyperlipidemia, unspecified: Secondary | ICD-10-CM

## 2016-10-02 MED ORDER — ATORVASTATIN CALCIUM 40 MG PO TABS: 40.0000 mg | ORAL_TABLET | Freq: Every day | ORAL | 3 refills | Status: DC

## 2016-10-02 NOTE — Telephone Encounter (Signed)
Spoke with pt and she asked that I speak with her daughter.  Advised daughter of lab results and recommendations per Dr. Irish Lack.  Verified pharmacy and sent in prescription.  Verified home address and advised that I will send orders to pt's home so that labs can be drawn in 3 months.  Daughter verbalized understanding and was in agreement with this plan.

## 2016-10-02 NOTE — Telephone Encounter (Signed)
-----   Message from Jettie Booze, MD sent at 10/02/2016  4:28 PM EDT ----- OK to decrease atorvastatin to 40 mg daily.  She will need f/u lipids in 3 months.  OK to do in [place] or with PMD.

## 2016-11-03 ENCOUNTER — Ambulatory Visit (HOSPITAL_COMMUNITY): Payer: Medicare Other

## 2016-11-06 ENCOUNTER — Ambulatory Visit (HOSPITAL_COMMUNITY): Payer: Medicare Other | Admitting: Oncology

## 2016-11-06 ENCOUNTER — Encounter (HOSPITAL_COMMUNITY): Payer: Medicare Other | Attending: Hematology & Oncology

## 2016-11-06 ENCOUNTER — Other Ambulatory Visit (HOSPITAL_COMMUNITY): Payer: Medicare Other

## 2016-11-06 DIAGNOSIS — C3492 Malignant neoplasm of unspecified part of left bronchus or lung: Secondary | ICD-10-CM | POA: Diagnosis present

## 2016-11-06 DIAGNOSIS — C3490 Malignant neoplasm of unspecified part of unspecified bronchus or lung: Secondary | ICD-10-CM

## 2016-11-06 LAB — COMPREHENSIVE METABOLIC PANEL
ALT: 16 U/L (ref 14–54)
AST: 16 U/L (ref 15–41)
Albumin: 3.7 g/dL (ref 3.5–5.0)
Alkaline Phosphatase: 53 U/L (ref 38–126)
Anion gap: 7 (ref 5–15)
BUN: 21 mg/dL — ABNORMAL HIGH (ref 6–20)
CO2: 29 mmol/L (ref 22–32)
Calcium: 9.4 mg/dL (ref 8.9–10.3)
Chloride: 100 mmol/L — ABNORMAL LOW (ref 101–111)
Creatinine, Ser: 1.26 mg/dL — ABNORMAL HIGH (ref 0.44–1.00)
GFR calc Af Amer: 45 mL/min — ABNORMAL LOW (ref >60)
GFR calc non Af Amer: 39 mL/min — ABNORMAL LOW (ref >60)
Glucose, Bld: 148 mg/dL — ABNORMAL HIGH (ref 65–99)
Potassium: 4.4 mmol/L (ref 3.5–5.1)
Sodium: 136 mmol/L (ref 135–145)
Total Bilirubin: 0.4 mg/dL (ref 0.3–1.2)
Total Protein: 7.3 g/dL (ref 6.5–8.1)

## 2016-11-06 LAB — CBC WITH DIFFERENTIAL/PLATELET
Basophils Absolute: 0 10*3/uL (ref 0.0–0.1)
Basophils Relative: 0 %
Eosinophils Absolute: 0.2 10*3/uL (ref 0.0–0.7)
Eosinophils Relative: 2 %
HCT: 39.4 % (ref 36.0–46.0)
Hemoglobin: 12.2 g/dL (ref 12.0–15.0)
Lymphocytes Relative: 20 %
Lymphs Abs: 1.6 10*3/uL (ref 0.7–4.0)
MCH: 29 pg (ref 26.0–34.0)
MCHC: 31 g/dL (ref 30.0–36.0)
MCV: 93.6 fL (ref 78.0–100.0)
Monocytes Absolute: 0.8 10*3/uL (ref 0.1–1.0)
Monocytes Relative: 10 %
Neutro Abs: 5.6 10*3/uL (ref 1.7–7.7)
Neutrophils Relative %: 68 %
Platelets: 203 10*3/uL (ref 150–400)
RBC: 4.21 MIL/uL (ref 3.87–5.11)
RDW: 14.8 % (ref 11.5–15.5)
WBC: 8.3 10*3/uL (ref 4.0–10.5)

## 2016-11-06 NOTE — Progress Notes (Signed)
-  Patient late for appt-  ROS

## 2016-11-06 NOTE — Assessment & Plan Note (Deleted)
Stage IA, non-small cell lung cancer, LUL, consistent with a poorly differentiated adenocarcinoma, status post resection 07/07/2008 in [place].  Labs today: CBC diff, CMET.  I personally reviewed and went over laboratory results with the patient.  The results are noted within this dictation.  She was a no show for her 11/03/2016 CT of chest.  I will get that rescheduled for her.   Order is placed for CT of chest wo contrast in 12 months.  Return in 12 months for follow-up with repeat labs and CT imaging.

## 2016-11-14 ENCOUNTER — Ambulatory Visit (HOSPITAL_COMMUNITY): Payer: Medicare Other | Admitting: Oncology

## 2016-12-05 ENCOUNTER — Encounter (HOSPITAL_COMMUNITY): Payer: Medicare Other | Attending: Hematology & Oncology | Admitting: Oncology

## 2016-12-05 ENCOUNTER — Encounter (HOSPITAL_COMMUNITY): Payer: Self-pay | Admitting: Oncology

## 2016-12-05 DIAGNOSIS — C3412 Malignant neoplasm of upper lobe, left bronchus or lung: Secondary | ICD-10-CM

## 2016-12-05 DIAGNOSIS — M818 Other osteoporosis without current pathological fracture: Secondary | ICD-10-CM

## 2016-12-05 DIAGNOSIS — C3492 Malignant neoplasm of unspecified part of left bronchus or lung: Secondary | ICD-10-CM | POA: Insufficient documentation

## 2016-12-05 NOTE — Progress Notes (Signed)
[facility], MD [address] [place] [address]  Adenocarcinoma of left lung [facility] - Plan: benzonatate (TESSALON) 100 MG capsule, CBC with Differential, Comprehensive metabolic panel  CURRENT THERAPY: Surveillance per NCCN guidelines  INTERVAL HISTORY: Angela Lawson 82 y.o. female returns for followup of stage I A., non-small cell lung cancer, consistent with a poorly differentiated adenocarcinoma, status post resection 07/07/2008 in [place].   Oncology History   stage I A., non-small cell lung cancer, consistent with a poorly differentiated adenocarcinoma, status post resection 07/07/2008 in [place].       Adenocarcinoma of lung ([place])   07/07/2008 Surgery    Performed in [place], [place].  Consistent with poorly differentiated adenocarcinoma.      02/27/2014 Imaging    CT of chest demonstrating a RLL solid nodular component measuring 9 mm at the site of previous ground-glass opacity. Slight increase in size of an anterior mediastinal node measuring 1.1 cm with stable 1 cm pretracheal lymph node. Consider PET      03/20/2014 Imaging    PET scan- No hypermetabolic pulmonary lesions to suggest recurrent or metastatic pulmonary disease.  Weakly positive left internal mammary lymph node and left axillary lymph node (? inflammatory process involving the left breast).      11/07/2015 Imaging    CT chest- 1. No evidence of metastatic involvement of the lungs. The previously noted small noncalcified lung nodules are stable. 2. Vague focal opacity in the right lower lobe may represent scarring and is stable. Continued followup however is recommended in 1 year.      She reports that she is doing well.  She denies any new complaints, but notes increased SOB and back pain with exertion.  At rest, her SOB is at baseline and her back pain resolves.  She continues to follow with Dr. Legrand Rams for her primary care needs.  She did receive a flu shot this  year.  Review of Systems  Constitutional: Negative.  Negative for chills and fever.  Eyes: Negative.   Respiratory: Positive for shortness of breath (on O2, worse with exertion, at baseline at rest). Negative for cough and hemoptysis.   Cardiovascular: Negative.  Negative for chest pain.  Gastrointestinal: Negative.  Negative for abdominal pain and nausea.  Genitourinary: Negative.   Musculoskeletal: Positive for back pain (with exertion).  Skin: Negative.   Neurological: Negative.  Negative for weakness.  Psychiatric/Behavioral: Negative.     Past Medical History:  Diagnosis Date  . Adenocarcinoma of lung ([place])    Left lung 2009, resected  . Arthritis   . CHF (congestive heart failure) ([place])   . COPD (chronic obstructive pulmonary disease) ([place])   . Diverticulitis   . Dyslipidemia   . Essential hypertension   . H/O ventral hernia   . Non-obstructive CAD    a. 04/2015 NSTEMI/Cath: LAD 10p, LCX 37m RCA 348m20d, EF 35-40 w/ apical ballooning.  . On home O2    3L N/C  . Osteoporosis   . Osteoporosis 11/04/2015   Managed by Dr. FaLegrand Rams . Parkinson's disease ([facility]  . Takotsubo cardiomyopathy    a. 04/2015 Echo: EF 45-50%, mid-dist anterior/apical/inferoapical HK w/ hyperdynamic base. Gr 1 DD, mild AI, mild-mod MR, triv TR, PASP 4868m;  b. 04/2015 LV gram: Ef 35-40% w/ apical ballooning.  . Type II diabetes mellitus ([facility] . Ventricular bigeminy    a. 04/2015 in setting of NSTEMI/Takotsubo.    Past Surgical  History:  Procedure Laterality Date  . ABDOMINAL HYSTERECTOMY    . CARDIAC CATHETERIZATION N/A 04/17/2015   Procedure: Left Heart Cath and Coronary Angiography;  Surgeon: Troy Sine, MD;  Location: [facility];  Service: Cardiovascular;  Laterality: N/A;  . CHOLECYSTECTOMY    . COLONOSCOPY N/A 09/18/2014   Procedure: COLONOSCOPY;  Surgeon: Danie Binder, MD;  [facility];  Service: Endoscopy;  Laterality: N/A;  8:30 AM - moved to 10:30 Rosendo Gros to  notify pt  . ECTOPIC PREGNANCY SURGERY    . INCISIONAL HERNIA REPAIR N/A 08/26/2013   Procedure: HERNIA REPAIR INCISIONAL WITH MESH;  Surgeon: Jamesetta So, MD;  Location: [facility];  Service: General;  Laterality: N/A;  . LUNG CANCER SURGERY      Family History  Problem Relation Age of Onset  . Diabetes Mother   . Hypertension Mother   . Diabetes Father   . Asthma    . Cancer    . Heart attack Sister     X2  . Colon cancer Neg Hx     Social History   Social History  . Marital status: Widowed    Spouse name: N/A  . Number of children: N/A  . Years of education: N/A   Social History Main Topics  . Smoking status: Former Smoker    Years: 20.00    Types: Cigarettes    Quit date: 08/13/2006  . Smokeless tobacco: Never Used  . Alcohol use No  . Drug use: No  . Sexual activity: Not Currently    Birth control/ protection: Surgical   Other Topics Concern  . None   Social History Narrative  . None     PHYSICAL EXAMINATION  ECOG PERFORMANCE STATUS: 2 - Symptomatic, <50% confined to bed  Vitals:   12/05/16 1110  BP: (!) 119/56  Pulse: 80  Resp: 16  Temp: 98.4 F (36.9 C)    GENERAL:alert, no distress, well nourished, well developed, comfortable, cooperative, smiling and accompanied by her daughter, in wheelchair, [place] in place for O2 delivery SKIN: skin color, texture, turgor are normal, no rashes or significant lesions HEAD: Normocephalic, No masses, lesions, tenderness or abnormalities EYES: normal, EOMI, Conjunctiva are pink and non-injected EARS: External ears normal OROPHARYNX:lips, buccal mucosa, and tongue normal and mucous membranes are moist  NECK: supple, no adenopathy, thyroid normal size, non-tender, without nodularity, trachea midline LYMPH:  no palpable lymphadenopathy BREAST:not examined LUNGS: decreased breath sounds bilaterally, particularly in upper lobes bilaterally. HEART: regular rate & rhythm, no murmurs and no gallops ABDOMEN:abdomen soft and  normal bowel sounds BACK: Back symmetric, no curvature. EXTREMITIES:less then 2 second capillary refill, no joint deformities, effusion, or inflammation, no skin discoloration, no cyanosis  NEURO: alert & oriented x 3 with fluent speech, no focal motor/sensory deficits  LABORATORY DATA: CBC    Component Value Date/Time   WBC 8.3 11/06/2016 1115   RBC 4.21 11/06/2016 1115   HGB 12.2 11/06/2016 1115   HCT 39.4 11/06/2016 1115   PLT 203 11/06/2016 1115   MCV 93.6 11/06/2016 1115   MCH 29.0 11/06/2016 1115   MCHC 31.0 11/06/2016 1115   RDW 14.8 11/06/2016 1115   LYMPHSABS 1.6 11/06/2016 1115   MONOABS 0.8 11/06/2016 1115   EOSABS 0.2 11/06/2016 1115   BASOSABS 0.0 11/06/2016 1115      Chemistry      Component Value Date/Time   NA 136 11/06/2016 1115   K 4.4 11/06/2016 1115   CL 100 (  L) 11/06/2016 1115   CO2 29 11/06/2016 1115   BUN 21 (H) 11/06/2016 1115   CREATININE 1.26 (H) 11/06/2016 1115   CREATININE 1.51 (H) 10/01/2016 1436      Component Value Date/Time   CALCIUM 9.4 11/06/2016 1115   CALCIUM 10.0 08/20/2012 1408   ALKPHOS 53 11/06/2016 1115   AST 16 11/06/2016 1115   ALT 16 11/06/2016 1115   BILITOT 0.4 11/06/2016 1115        PENDING LABS:   RADIOGRAPHIC STUDIES:  No results found.   PATHOLOGY:    ASSESSMENT AND PLAN:  Adenocarcinoma of lung (HCC) Stage IA, non-small cell lung cancer, LUL, consistent with a poorly differentiated adenocarcinoma, status post resection 07/07/2008 in [place].  Labs today: CBC diff, CMET.  I personally reviewed and went over laboratory results with the patient.  The results are noted within this dictation.  She was a no show for her 11/03/2016 CT of chest.  I will get that rescheduled for her and anticipate it being performed within the next 2 weeks.  We will call her with results and if abnormality is discovered, we will address accordingly.  Order is placed for CT of chest wo contrast in 12 months.  Return  in 12 months for follow-up with repeat labs and CT imaging.   ORDERS PLACED FOR THIS ENCOUNTER: Orders Placed This Encounter  Procedures  . CBC with Differential  . Comprehensive metabolic panel    MEDICATIONS PRESCRIBED THIS ENCOUNTER: Meds ordered this encounter  Medications  . benzonatate (TESSALON) 100 MG capsule    Sig: TAKE ONE CAPSULE 3 TIMES A DAY    Refill:  12    THERAPY PLAN:  NCCN guidelines for Non-Small Cell Lung Cancer Surveillance in the setting of clinical/radiographic remission are as follows (5.2017):  A. Stage I-II (primary treatment included surgery +/- chemotherapy):   1. H+P and chest CT +/- contrast every 6 months for 2-3 years, then H+P and low-dose non-contrast-enhanced chest CT annually  B. Stage I-II (primary treatment included RT) or Stage III or Stage IV (oligometastatic with all sites treated with definitive intent)   1. H+P and chest CT +/- contrast every 3-6 months for 3 years, then H+P and chest CT +/- contrast every 6 months for 2 years, then H+P and low-dose non-contrast-enhanced chest CT annually    A. Residual or new radiographic abnormalities may require more frequent imaging  C. Smoking cessation advice, counseling, and pharmacotherapy  D. PET/CT or Brain MRI is not routinely indicated.   All questions were answered. The patient knows to call the clinic with any problems, questions or concerns. We can certainly see the patient much sooner if necessary.  Patient and plan discussed with Dr. Ancil Linsey and she is in agreement with the aforementioned.   This note is electronically signed by: Doy Mince 12/05/2016 11:29 AM

## 2016-12-05 NOTE — Patient Instructions (Addendum)
[place] at [facility] Discharge Instructions  RECOMMENDATIONS MADE BY THE CONSULTANT AND ANY TEST RESULTS WILL BE SENT TO YOUR REFERRING PHYSICIAN.  You were seen today by Kirby Crigler, PA-C CT chest in 1-2 weeks.  We will call you with the results. Lab work and CT chest in 12 months Return to clinic in 12 months for follow up   Thank you for choosing [place] at [facility] to provide your oncology and hematology care.  To afford each patient quality time with our provider, please arrive at least 15 minutes before your scheduled appointment time.    If you have a lab appointment with the [facility] please come in thru the  Main Entrance and check in at the main information desk  You need to re-schedule your appointment should you arrive 10 or more minutes late.  We strive to give you quality time with our providers, and arriving late affects you and other patients whose appointments are after yours.  Also, if you no show three or more times for appointments you may be dismissed from the clinic at the providers discretion.     Again, thank you for choosing [facility].  Our hope is that these requests will decrease the amount of time that you wait before being seen by our physicians.       _____________________________________________________________  Should you have questions after your visit to [facility], please contact our office at [phone] between the hours of 8:30 a.m. and 4:30 p.m.  Voicemails left after 4:30 p.m. will not be returned until the following business day.  For prescription refill requests, have your pharmacy contact our office.       Resources For Cancer Patients and their Caregivers ? American Cancer Society: Can assist with transportation, wigs, general needs, runs Look Good Feel Better.        224-134-7467 ? Cancer Care: Provides financial assistance, online support groups,  medication/co-pay assistance.  1-800-813-HOPE 804-073-7701) ? [place] Assists [place] cancer patients and their families through emotional , educational and financial support.  617-425-9765 ? [place] DSS Where to apply for food stamps, Medicaid and utility assistance. 727-196-4671 ? [facility]: Transportation to medical appointments. 815-098-5477 ? Social Security Administration: May apply for disability if have a Stage IV cancer. (701)063-1852 (240)724-6951 ? [employer], Disability and Transit Services: Assists with nutrition, care and transit needs. [place] Support Programs: '@10RELATIVEDAYS'$ @ > Cancer Support Group  2nd Tuesday of the month 1pm-2pm, Journey Room  > Creative Journey  3rd Tuesday of the month 1130am-1pm, Journey Room  > Look Good Feel Better  1st Wednesday of the month 10am-12 noon, Journey Room (Call [place] to register [phone])

## 2016-12-05 NOTE — Assessment & Plan Note (Addendum)
Stage IA, non-small cell lung cancer, LUL, consistent with a poorly differentiated adenocarcinoma, status post resection 07/07/2008 in [place].  Labs today: CBC diff, CMET.  I personally reviewed and went over laboratory results with the patient.  The results are noted within this dictation.  She was a no show for her 11/03/2016 CT of chest.  I will get that rescheduled for her and anticipate it being performed within the next 2 weeks.  We will call her with results and if abnormality is discovered, we will address accordingly.  Order is placed for CT of chest wo contrast in 12 months.  Return in 12 months for follow-up with repeat labs and CT imaging.

## 2016-12-19 DIAGNOSIS — J449 Chronic obstructive pulmonary disease, unspecified: Secondary | ICD-10-CM | POA: Diagnosis not present

## 2016-12-24 ENCOUNTER — Ambulatory Visit (HOSPITAL_COMMUNITY): Payer: Medicare Other

## 2016-12-25 DIAGNOSIS — J449 Chronic obstructive pulmonary disease, unspecified: Secondary | ICD-10-CM | POA: Diagnosis not present

## 2017-01-07 ENCOUNTER — Ambulatory Visit (HOSPITAL_COMMUNITY): Payer: Medicare Other

## 2017-01-07 ENCOUNTER — Ambulatory Visit (HOSPITAL_COMMUNITY)
Admission: RE | Admit: 2017-01-07 | Discharge: 2017-01-07 | Disposition: A | Discharge status: Home / Self Care | Payer: Medicare Other | Source: Ambulatory Visit | Attending: Oncology | Admitting: Oncology

## 2017-01-07 DIAGNOSIS — I252 Old myocardial infarction: Secondary | ICD-10-CM | POA: Diagnosis not present

## 2017-01-07 DIAGNOSIS — J439 Emphysema, unspecified: Secondary | ICD-10-CM

## 2017-01-07 DIAGNOSIS — R918 Other nonspecific abnormal finding of lung field: Secondary | ICD-10-CM

## 2017-01-07 DIAGNOSIS — K449 Diaphragmatic hernia without obstruction or gangrene: Secondary | ICD-10-CM | POA: Insufficient documentation

## 2017-01-07 DIAGNOSIS — N183 Chronic kidney disease, stage 3 (moderate): Secondary | ICD-10-CM | POA: Diagnosis not present

## 2017-01-07 DIAGNOSIS — R0902 Hypoxemia: Secondary | ICD-10-CM | POA: Diagnosis not present

## 2017-01-07 DIAGNOSIS — I2510 Atherosclerotic heart disease of native coronary artery without angina pectoris: Secondary | ICD-10-CM | POA: Insufficient documentation

## 2017-01-07 DIAGNOSIS — Z87891 Personal history of nicotine dependence: Secondary | ICD-10-CM | POA: Diagnosis not present

## 2017-01-07 DIAGNOSIS — Z8249 Family history of ischemic heart disease and other diseases of the circulatory system: Secondary | ICD-10-CM | POA: Diagnosis not present

## 2017-01-07 DIAGNOSIS — Z7982 Long term (current) use of aspirin: Secondary | ICD-10-CM | POA: Diagnosis not present

## 2017-01-07 DIAGNOSIS — E1122 Type 2 diabetes mellitus with diabetic chronic kidney disease: Secondary | ICD-10-CM | POA: Diagnosis not present

## 2017-01-07 DIAGNOSIS — C3491 Malignant neoplasm of unspecified part of right bronchus or lung: Secondary | ICD-10-CM | POA: Diagnosis not present

## 2017-01-07 DIAGNOSIS — Z833 Family history of diabetes mellitus: Secondary | ICD-10-CM | POA: Diagnosis not present

## 2017-01-07 DIAGNOSIS — I700 Atherosclerosis of aorta: Secondary | ICD-10-CM

## 2017-01-07 DIAGNOSIS — J441 Chronic obstructive pulmonary disease with (acute) exacerbation: Secondary | ICD-10-CM | POA: Diagnosis not present

## 2017-01-07 DIAGNOSIS — Z79899 Other long term (current) drug therapy: Secondary | ICD-10-CM | POA: Diagnosis not present

## 2017-01-07 DIAGNOSIS — M810 Age-related osteoporosis without current pathological fracture: Secondary | ICD-10-CM | POA: Diagnosis not present

## 2017-01-07 DIAGNOSIS — C3490 Malignant neoplasm of unspecified part of unspecified bronchus or lung: Secondary | ICD-10-CM | POA: Insufficient documentation

## 2017-01-07 DIAGNOSIS — Z85118 Personal history of other malignant neoplasm of bronchus and lung: Secondary | ICD-10-CM | POA: Diagnosis not present

## 2017-01-07 DIAGNOSIS — Z825 Family history of asthma and other chronic lower respiratory diseases: Secondary | ICD-10-CM | POA: Diagnosis not present

## 2017-01-07 DIAGNOSIS — R05 Cough: Secondary | ICD-10-CM | POA: Diagnosis not present

## 2017-01-07 DIAGNOSIS — Z794 Long term (current) use of insulin: Secondary | ICD-10-CM | POA: Diagnosis not present

## 2017-01-07 DIAGNOSIS — I130 Hypertensive heart and chronic kidney disease with heart failure and stage 1 through stage 4 chronic kidney disease, or unspecified chronic kidney disease: Secondary | ICD-10-CM | POA: Diagnosis not present

## 2017-01-07 DIAGNOSIS — E785 Hyperlipidemia, unspecified: Secondary | ICD-10-CM | POA: Diagnosis not present

## 2017-01-07 DIAGNOSIS — Z9981 Dependence on supplemental oxygen: Secondary | ICD-10-CM | POA: Diagnosis not present

## 2017-01-07 DIAGNOSIS — I509 Heart failure, unspecified: Secondary | ICD-10-CM | POA: Diagnosis not present

## 2017-01-07 DIAGNOSIS — G20 Parkinson's disease: Secondary | ICD-10-CM | POA: Diagnosis not present

## 2017-01-07 DIAGNOSIS — J9621 Acute and chronic respiratory failure with hypoxia: Secondary | ICD-10-CM | POA: Diagnosis not present

## 2017-01-08 ENCOUNTER — Other Ambulatory Visit (HOSPITAL_COMMUNITY): Payer: Self-pay | Admitting: Oncology

## 2017-01-08 DIAGNOSIS — C3492 Malignant neoplasm of unspecified part of left bronchus or lung: Secondary | ICD-10-CM

## 2017-01-10 ENCOUNTER — Encounter (HOSPITAL_COMMUNITY): Payer: Self-pay | Admitting: *Deleted

## 2017-01-10 ENCOUNTER — Emergency Department (HOSPITAL_COMMUNITY): Payer: Medicare Other

## 2017-01-10 ENCOUNTER — Inpatient Hospital Stay (HOSPITAL_COMMUNITY)
Admission: EM | Admit: 2017-01-10 | Discharge: 2017-01-13 | DRG: 190 | Disposition: A | Discharge status: Home / Self Care | Payer: Medicare Other | Attending: Internal Medicine | Admitting: Internal Medicine

## 2017-01-10 DIAGNOSIS — I130 Hypertensive heart and chronic kidney disease with heart failure and stage 1 through stage 4 chronic kidney disease, or unspecified chronic kidney disease: Secondary | ICD-10-CM | POA: Diagnosis present

## 2017-01-10 DIAGNOSIS — I252 Old myocardial infarction: Secondary | ICD-10-CM

## 2017-01-10 DIAGNOSIS — E1122 Type 2 diabetes mellitus with diabetic chronic kidney disease: Secondary | ICD-10-CM | POA: Diagnosis present

## 2017-01-10 DIAGNOSIS — C3490 Malignant neoplasm of unspecified part of unspecified bronchus or lung: Secondary | ICD-10-CM

## 2017-01-10 DIAGNOSIS — E785 Hyperlipidemia, unspecified: Secondary | ICD-10-CM | POA: Diagnosis present

## 2017-01-10 DIAGNOSIS — I10 Essential (primary) hypertension: Secondary | ICD-10-CM

## 2017-01-10 DIAGNOSIS — Z87891 Personal history of nicotine dependence: Secondary | ICD-10-CM | POA: Diagnosis not present

## 2017-01-10 DIAGNOSIS — Z9981 Dependence on supplemental oxygen: Secondary | ICD-10-CM

## 2017-01-10 DIAGNOSIS — M810 Age-related osteoporosis without current pathological fracture: Secondary | ICD-10-CM | POA: Diagnosis present

## 2017-01-10 DIAGNOSIS — G20 Parkinson's disease: Secondary | ICD-10-CM | POA: Diagnosis present

## 2017-01-10 DIAGNOSIS — Z7982 Long term (current) use of aspirin: Secondary | ICD-10-CM | POA: Diagnosis not present

## 2017-01-10 DIAGNOSIS — Z85118 Personal history of other malignant neoplasm of bronchus and lung: Secondary | ICD-10-CM

## 2017-01-10 DIAGNOSIS — E119 Type 2 diabetes mellitus without complications: Secondary | ICD-10-CM

## 2017-01-10 DIAGNOSIS — Z825 Family history of asthma and other chronic lower respiratory diseases: Secondary | ICD-10-CM | POA: Diagnosis not present

## 2017-01-10 DIAGNOSIS — Z79899 Other long term (current) drug therapy: Secondary | ICD-10-CM | POA: Diagnosis not present

## 2017-01-10 DIAGNOSIS — Z90710 Acquired absence of both cervix and uterus: Secondary | ICD-10-CM

## 2017-01-10 DIAGNOSIS — N183 Chronic kidney disease, stage 3 unspecified: Secondary | ICD-10-CM | POA: Diagnosis present

## 2017-01-10 DIAGNOSIS — J441 Chronic obstructive pulmonary disease with (acute) exacerbation: Secondary | ICD-10-CM | POA: Diagnosis not present

## 2017-01-10 DIAGNOSIS — R05 Cough: Secondary | ICD-10-CM | POA: Diagnosis not present

## 2017-01-10 DIAGNOSIS — Z794 Long term (current) use of insulin: Secondary | ICD-10-CM

## 2017-01-10 DIAGNOSIS — I509 Heart failure, unspecified: Secondary | ICD-10-CM | POA: Diagnosis present

## 2017-01-10 DIAGNOSIS — J9621 Acute and chronic respiratory failure with hypoxia: Secondary | ICD-10-CM | POA: Diagnosis present

## 2017-01-10 DIAGNOSIS — R0902 Hypoxemia: Secondary | ICD-10-CM

## 2017-01-10 DIAGNOSIS — J449 Chronic obstructive pulmonary disease, unspecified: Secondary | ICD-10-CM | POA: Diagnosis not present

## 2017-01-10 DIAGNOSIS — I2510 Atherosclerotic heart disease of native coronary artery without angina pectoris: Secondary | ICD-10-CM | POA: Diagnosis present

## 2017-01-10 DIAGNOSIS — Z8249 Family history of ischemic heart disease and other diseases of the circulatory system: Secondary | ICD-10-CM

## 2017-01-10 DIAGNOSIS — Z833 Family history of diabetes mellitus: Secondary | ICD-10-CM

## 2017-01-10 DIAGNOSIS — I5032 Chronic diastolic (congestive) heart failure: Secondary | ICD-10-CM

## 2017-01-10 HISTORY — DX: Shortness of breath: R06.02

## 2017-01-10 HISTORY — DX: Dorsalgia, unspecified: M54.9

## 2017-01-10 LAB — BASIC METABOLIC PANEL WITH GFR
Anion gap: 6 (ref 5–15)
BUN: 20 mg/dL (ref 6–20)
CO2: 33 mmol/L — ABNORMAL HIGH (ref 22–32)
Calcium: 9.7 mg/dL (ref 8.9–10.3)
Chloride: 99 mmol/L — ABNORMAL LOW (ref 101–111)
Creatinine, Ser: 1.51 mg/dL — ABNORMAL HIGH (ref 0.44–1.00)
GFR calc Af Amer: 36 mL/min — ABNORMAL LOW
GFR calc non Af Amer: 31 mL/min — ABNORMAL LOW
Glucose, Bld: 159 mg/dL — ABNORMAL HIGH (ref 65–99)
Potassium: 4.6 mmol/L (ref 3.5–5.1)
Sodium: 138 mmol/L (ref 135–145)

## 2017-01-10 LAB — CBC WITH DIFFERENTIAL/PLATELET
Basophils Absolute: 0 10*3/uL (ref 0.0–0.1)
Basophils Relative: 0 %
Eosinophils Absolute: 0.4 10*3/uL (ref 0.0–0.7)
Eosinophils Relative: 5 %
HCT: 39.3 % (ref 36.0–46.0)
Hemoglobin: 12.6 g/dL (ref 12.0–15.0)
Lymphocytes Relative: 23 %
Lymphs Abs: 1.9 10*3/uL (ref 0.7–4.0)
MCH: 30.6 pg (ref 26.0–34.0)
MCHC: 32.1 g/dL (ref 30.0–36.0)
MCV: 95.4 fL (ref 78.0–100.0)
Monocytes Absolute: 1 10*3/uL (ref 0.1–1.0)
Monocytes Relative: 13 %
Neutro Abs: 4.7 10*3/uL (ref 1.7–7.7)
Neutrophils Relative %: 59 %
Platelets: 233 10*3/uL (ref 150–400)
RBC: 4.12 MIL/uL (ref 3.87–5.11)
RDW: 14.9 % (ref 11.5–15.5)
WBC: 8.1 10*3/uL (ref 4.0–10.5)

## 2017-01-10 LAB — BRAIN NATRIURETIC PEPTIDE: B Natriuretic Peptide: 15 pg/mL (ref 0.0–100.0)

## 2017-01-10 LAB — D-DIMER, QUANTITATIVE: D-Dimer, Quant: 0.64 ug{FEU}/mL — ABNORMAL HIGH (ref 0.00–0.50)

## 2017-01-10 LAB — TROPONIN I: Troponin I: <0.03 ng/mL

## 2017-01-10 MED ORDER — PANTOPRAZOLE SODIUM 40 MG PO TBEC
40.0000 mg | DELAYED_RELEASE_TABLET | Freq: Every day | ORAL | Status: DC
  Administered 2017-01-11 – 2017-01-13 (×3): 40 mg via ORAL
  Filled 2017-01-10 (×3): qty 1

## 2017-01-10 MED ORDER — FUROSEMIDE 40 MG PO TABS
40.0000 mg | ORAL_TABLET | Freq: Every day | ORAL | Status: DC
  Administered 2017-01-11 – 2017-01-13 (×3): 40 mg via ORAL
  Filled 2017-01-10 (×3): qty 1

## 2017-01-10 MED ORDER — GABAPENTIN 300 MG PO CAPS
300.0000 mg | ORAL_CAPSULE | Freq: Every day | ORAL | Status: DC
  Administered 2017-01-11 – 2017-01-13 (×3): 300 mg via ORAL
  Filled 2017-01-10 (×3): qty 1

## 2017-01-10 MED ORDER — INSULIN GLARGINE 100 UNIT/ML ~~LOC~~ SOLN
15.0000 [IU] | Freq: Every morning | SUBCUTANEOUS | Status: DC
  Administered 2017-01-11 – 2017-01-13 (×3): 15 [IU] via SUBCUTANEOUS
  Filled 2017-01-10 (×4): qty 0.15

## 2017-01-10 MED ORDER — ROPINIROLE HCL 0.25 MG PO TABS
0.2500 mg | ORAL_TABLET | Freq: Three times a day (TID) | ORAL | Status: DC
  Administered 2017-01-11 – 2017-01-13 (×7): 0.25 mg via ORAL
  Filled 2017-01-10 (×12): qty 1

## 2017-01-10 MED ORDER — METHYLPREDNISOLONE SODIUM SUCC 40 MG IJ SOLR
40.0000 mg | Freq: Four times a day (QID) | INTRAMUSCULAR | Status: DC
  Administered 2017-01-11 (×3): 40 mg via INTRAVENOUS
  Filled 2017-01-10 (×3): qty 1

## 2017-01-10 MED ORDER — IPRATROPIUM BROMIDE 0.02 % IN SOLN
1.0000 mg | Freq: Once | RESPIRATORY_TRACT | Status: AC
  Administered 2017-01-10: 1 mg via RESPIRATORY_TRACT
  Filled 2017-01-10: qty 5

## 2017-01-10 MED ORDER — HEPARIN SODIUM (PORCINE) 5000 UNIT/ML IJ SOLN
5000.0000 [IU] | Freq: Three times a day (TID) | INTRAMUSCULAR | Status: DC
  Administered 2017-01-11 – 2017-01-13 (×6): 5000 [IU] via SUBCUTANEOUS
  Filled 2017-01-10 (×6): qty 1

## 2017-01-10 MED ORDER — POTASSIUM CHLORIDE ER 10 MEQ PO TBCR
10.0000 meq | EXTENDED_RELEASE_TABLET | Freq: Every day | ORAL | Status: DC
  Filled 2017-01-10 (×2): qty 1

## 2017-01-10 MED ORDER — METOPROLOL SUCCINATE ER 25 MG PO TB24
25.0000 mg | ORAL_TABLET | Freq: Every day | ORAL | Status: DC
  Administered 2017-01-11 – 2017-01-13 (×3): 25 mg via ORAL
  Filled 2017-01-10 (×3): qty 1

## 2017-01-10 MED ORDER — INSULIN ASPART 100 UNIT/ML ~~LOC~~ SOLN: 0.0000 [IU] | Freq: Every day | SUBCUTANEOUS | Status: DC

## 2017-01-10 MED ORDER — SODIUM CHLORIDE 0.9% FLUSH
3.0000 mL | Freq: Two times a day (BID) | INTRAVENOUS | Status: DC
  Administered 2017-01-11 – 2017-01-12 (×3): 3 mL via INTRAVENOUS

## 2017-01-10 MED ORDER — VITAMIN D 1000 UNITS PO TABS
1000.0000 [IU] | ORAL_TABLET | Freq: Every day | ORAL | Status: DC
  Administered 2017-01-11 – 2017-01-13 (×3): 1000 [IU] via ORAL
  Filled 2017-01-10 (×3): qty 1

## 2017-01-10 MED ORDER — ACETAMINOPHEN 650 MG RE SUPP: 650.0000 mg | Freq: Four times a day (QID) | RECTAL | Status: DC | PRN

## 2017-01-10 MED ORDER — BUPROPION HCL ER (SR) 150 MG PO TB12
150.0000 mg | ORAL_TABLET | Freq: Two times a day (BID) | ORAL | Status: DC
Start: 2017-01-10 — End: 2017-01-13
  Administered 2017-01-11 – 2017-01-13 (×6): 150 mg via ORAL
  Filled 2017-01-10 (×6): qty 1

## 2017-01-10 MED ORDER — VITAMIN B-6 50 MG PO TABS
100.0000 mg | ORAL_TABLET | Freq: Every day | ORAL | Status: DC
  Administered 2017-01-11 – 2017-01-13 (×3): 100 mg via ORAL
  Filled 2017-01-10 (×3): qty 2

## 2017-01-10 MED ORDER — ATORVASTATIN CALCIUM 40 MG PO TABS
40.0000 mg | ORAL_TABLET | Freq: Every day | ORAL | Status: DC
  Administered 2017-01-11 – 2017-01-13 (×3): 40 mg via ORAL
  Filled 2017-01-10 (×3): qty 1

## 2017-01-10 MED ORDER — ACETAMINOPHEN 325 MG PO TABS
650.0000 mg | ORAL_TABLET | Freq: Four times a day (QID) | ORAL | Status: DC | PRN
  Administered 2017-01-11 – 2017-01-12 (×3): 650 mg via ORAL
  Filled 2017-01-10 (×3): qty 2

## 2017-01-10 MED ORDER — ASPIRIN EC 81 MG PO TBEC
81.0000 mg | DELAYED_RELEASE_TABLET | Freq: Every day | ORAL | Status: DC
  Administered 2017-01-11 – 2017-01-13 (×3): 81 mg via ORAL
  Filled 2017-01-10 (×3): qty 1

## 2017-01-10 MED ORDER — IPRATROPIUM-ALBUTEROL 0.5-2.5 (3) MG/3ML IN SOLN
3.0000 mL | Freq: Four times a day (QID) | RESPIRATORY_TRACT | Status: DC
  Administered 2017-01-11: 3 mL via RESPIRATORY_TRACT
  Filled 2017-01-10: qty 3

## 2017-01-10 MED ORDER — OMEGA-3-ACID ETHYL ESTERS 1 G PO CAPS
1.0000 g | ORAL_CAPSULE | Freq: Two times a day (BID) | ORAL | Status: DC
Start: 2017-01-10 — End: 2017-01-13
  Administered 2017-01-11 – 2017-01-13 (×6): 1 g via ORAL
  Filled 2017-01-10 (×10): qty 1

## 2017-01-10 MED ORDER — INSULIN ASPART 100 UNIT/ML ~~LOC~~ SOLN
0.0000 [IU] | Freq: Three times a day (TID) | SUBCUTANEOUS | Status: DC
  Administered 2017-01-11 – 2017-01-13 (×7): 4 [IU] via SUBCUTANEOUS

## 2017-01-10 MED ORDER — LINAGLIPTIN 5 MG PO TABS
5.0000 mg | ORAL_TABLET | Freq: Every day | ORAL | Status: DC
  Administered 2017-01-11 – 2017-01-13 (×3): 5 mg via ORAL
  Filled 2017-01-10 (×3): qty 1

## 2017-01-10 MED ORDER — ONDANSETRON HCL 4 MG/2ML IJ SOLN: 4.0000 mg | Freq: Four times a day (QID) | INTRAMUSCULAR | Status: DC | PRN

## 2017-01-10 MED ORDER — TRAMADOL HCL 50 MG PO TABS: 50.0000 mg | ORAL_TABLET | ORAL | Status: DC | PRN

## 2017-01-10 MED ORDER — INSULIN ASPART 100 UNIT/ML ~~LOC~~ SOLN
6.0000 [IU] | Freq: Three times a day (TID) | SUBCUTANEOUS | Status: DC
  Administered 2017-01-11 – 2017-01-13 (×7): 6 [IU] via SUBCUTANEOUS

## 2017-01-10 MED ORDER — ALBUTEROL (5 MG/ML) CONTINUOUS INHALATION SOLN
10.0000 mg/h | INHALATION_SOLUTION | Freq: Once | RESPIRATORY_TRACT | Status: AC
  Administered 2017-01-10: 10 mg/h via RESPIRATORY_TRACT

## 2017-01-10 MED ORDER — ONDANSETRON HCL 4 MG PO TABS: 4.0000 mg | ORAL_TABLET | Freq: Four times a day (QID) | ORAL | Status: DC | PRN

## 2017-01-10 MED ORDER — METHYLPREDNISOLONE SODIUM SUCC 125 MG IJ SOLR
125.0000 mg | Freq: Once | INTRAMUSCULAR | Status: AC
  Administered 2017-01-10: 125 mg via INTRAVENOUS
  Filled 2017-01-10: qty 2

## 2017-01-10 NOTE — ED Notes (Addendum)
Pts ambulating O2 dropped to %87 on 3L. Came back up to %93 at rest.

## 2017-01-10 NOTE — H&P (Signed)
History and Physical    [name]:[id number] DOB: Apr 22, 1934 DOA: 01/10/2017  PCP: Rosita Fire, MD  Patient coming from: Home.    Chief Complaint:  Shortness of breath and wheezing.   HPI: Angela Lawson is an 83 y.o. female with hx of lung CA, s/p surgical excision about 9 years ago, no chemo or Rx per patient, hx of DM, COPD with home oxygen, CHF, bigeminy, HTN, HLD, lives at home with one of her daughter, with recent CT showed new pulmonary lesion (Dr Donald Pore), presented to the ER with increase SOB and wheezing.  She used her neb at home without improvement.  In the ER, she desat to 80;s percent with exertion, and 93+% at rest.  Work up included a CXR with no infiltrate, and normal WBC with Cr of 1.5, negative troponin, and normal EKG.  She was given IV steroid, and hour nebs, and hospitalist was asked to admit he for further eval and Tx.    ED Course:  See above.  Rewiew of Systems:  Constitutional: Negative for malaise, fever and chills. No significant weight loss or weight gain Eyes: Negative for eye pain, redness and discharge, diplopia, visual changes, or flashes of light. ENMT: Negative for ear pain, hoarseness, nasal congestion, sinus pressure and sore throat. No headaches; tinnitus, drooling, or problem swallowing. Cardiovascular: Negative for chest pain, palpitations, diaphoresis, dyspnea and peripheral edema. ; No orthopnea, PND Respiratory: Negative for cough, hemoptysis,  and stridor. No pleuritic chestpain. Gastrointestinal: Negative for diarrhea, constipation,  melena, blood in stool, hematemesis, jaundice and rectal bleeding.    Genitourinary: Negative for frequency, dysuria, incontinence,flank pain and hematuria; Musculoskeletal: Negative for back pain and neck pain. Negative for swelling and trauma.;  Skin: . Negative for pruritus, rash, abrasions, bruising and skin lesion.; ulcerations Neuro: Negative for headache, lightheadedness and neck stiffness. Negative for  weakness, altered level of consciousness , altered mental status, extremity weakness, burning feet, involuntary movement, seizure and syncope.  Psych: negative for anxiety, depression, insomnia, tearfulness, panic attacks, hallucinations, paranoia, suicidal or homicidal ideation    Past Medical History:  Diagnosis Date  . Adenocarcinoma of lung ([place])    Left lung 2009, resected  . Arthritis   . Back pain   . CHF (congestive heart failure) ([place])   . COPD (chronic obstructive pulmonary disease) ([place])   . Diverticulitis   . Dyslipidemia   . Essential hypertension   . H/O ventral hernia   . Non-obstructive CAD    a. 04/2015 NSTEMI/Cath: LAD 10p, LCX 77m RCA 314m20d, EF 35-40 w/ apical ballooning.  . On home O2    3L N/C  . Osteoporosis   . Osteoporosis 11/04/2015   Managed by Dr. FaLegrand Rams . Parkinson's disease ([facility]  . Shortness of breath   . Takotsubo cardiomyopathy    a. 04/2015 Echo: EF 45-50%, mid-dist anterior/apical/inferoapical HK w/ hyperdynamic base. Gr 1 DD, mild AI, mild-mod MR, triv TR, PASP 4861m;  b. 04/2015 LV gram: Ef 35-40% w/ apical ballooning.  . Type II diabetes mellitus ([facility] . Ventricular bigeminy    a. 04/2015 in setting of NSTEMI/Takotsubo.     Past Surgical History:  Procedure Laterality Date  . ABDOMINAL HYSTERECTOMY    . CARDIAC CATHETERIZATION N/A 04/17/2015   Procedure: Left Heart Cath and Coronary Angiography;  Surgeon: ThoTroy SineD;  Location: [facility];  Service: Cardiovascular;  Laterality: N/A;  . CHOLECYSTECTOMY    . COLONOSCOPY N/A 09/18/2014  Procedure: COLONOSCOPY;  Surgeon: Danie Binder, MD;  Location: [facility];  Service: Endoscopy;  Laterality: N/A;  8:30 AM - moved to 10:30 Rosendo Gros to notify pt  . ECTOPIC PREGNANCY SURGERY    . INCISIONAL HERNIA REPAIR N/A 08/26/2013   Procedure: HERNIA REPAIR INCISIONAL WITH MESH;  Surgeon: Jamesetta So, MD;  Location: [facility];  Service: General;  Laterality: N/A;  . LUNG  CANCER SURGERY       reports that she quit smoking about 10 years ago. Her smoking use included Cigarettes. She quit after 20.00 years of use. She has never used smokeless tobacco. She reports that she does not drink alcohol or use drugs.  Allergies  Allergen Reactions  . Lisinopril Cough    Family History  Problem Relation Age of Onset  . Diabetes Mother   . Hypertension Mother   . Diabetes Father   . Asthma    . Cancer    . Heart attack Sister     X2  . Colon cancer Neg Hx      Prior to Admission medications   Medication Sig Start Date End Date Taking? Authorizing Provider  albuterol (PROAIR HFA) 108 (90 BASE) MCG/ACT inhaler Inhale 2 puffs into the lungs every 4 (four) hours as needed for wheezing or shortness of breath.   Yes Historical Provider, MD  alendronate (FOSAMAX) 70 MG tablet Take 70 mg by mouth once a week. On Fridays 03/14/15  Yes Historical Provider, MD  aspirin EC 81 MG tablet Take 81 mg by mouth daily.   Yes Historical Provider, MD  atorvastatin (LIPITOR) 40 MG tablet Take 1 tablet (40 mg total) by mouth daily. 10/02/16 01/10/17 Yes Jettie Booze, MD  benzonatate (TESSALON) 100 MG capsule TAKE ONE CAPSULE 3 TIMES A DAY 11/28/16  Yes Historical Provider, MD  buPROPion (WELLBUTRIN SR) 150 MG 12 hr tablet Take 150 mg by mouth 2 (two) times daily.   Yes Historical Provider, MD  Cholecalciferol (VITAMIN D-3) 1000 UNITS CAPS Take 1 capsule by mouth daily.   Yes Historical Provider, MD  fish oil-omega-3 fatty acids 1000 MG capsule Take 1 g by mouth 2 (two) times daily.    Yes Historical Provider, MD  furosemide (LASIX) 40 MG tablet Take 1 tablet by mouth daily, take 1 extra pill daily as needed for weight increase of 3 lbs in a day or 5 lbs in a week Patient taking differently: Take 40 mg by mouth daily.  05/21/16  Yes Isaiah Serge, NP  gabapentin (NEURONTIN) 300 MG capsule Take 300 mg by mouth daily.    Yes Historical Provider, MD  Garlic 619 MG TABS Take 300 mg by  mouth daily.   Yes Historical Provider, MD  ipratropium-albuterol (DUONEB) 0.5-2.5 (3) MG/3ML SOLN Inhale 3 mLs into the lungs 4 (four) times daily.  09/05/14  Yes Historical Provider, MD  LANTUS SOLOSTAR 100 UNIT/ML Solostar Pen Inject 15 Units into the skin every morning.  05/21/16  Yes Historical Provider, MD  linagliptin (TRADJENTA) 5 MG TABS tablet Take 5 mg by mouth daily.   Yes Historical Provider, MD  loratadine (CLARITIN) 10 MG tablet Take 10 mg by mouth daily. 05/01/16  Yes Historical Provider, MD  losartan (COZAAR) 50 MG tablet Take 50 mg by mouth daily. 06/29/15  Yes Historical Provider, MD  meclizine (ANTIVERT) 25 MG tablet Take 25 mg by mouth daily.  09/19/16  Yes Historical Provider, MD  metoprolol succinate (TOPROL-XL) 25 MG 24 hr tablet Take 1  tablet (25 mg total) by mouth daily. 06/26/15  Yes Jettie Booze, MD  nitroGLYCERIN (NITROSTAT) 0.4 MG SL tablet Place 0.4 mg under the tongue every 5 (five) minutes as needed for chest pain.   Yes Historical Provider, MD  omeprazole (PRILOSEC) 20 MG capsule Take 1 capsule (20 mg total) by mouth daily. 10/01/16  Yes Jettie Booze, MD  OXYGEN Inhale 3 L into the lungs continuous.   Yes Historical Provider, MD  potassium chloride (K-DUR) 10 MEQ tablet Take 1 tablet (10 mEq total) by mouth daily. 05/21/16  Yes Isaiah Serge, NP  pyridoxine (B-6) 100 MG tablet Take 100 mg by mouth daily. Reported on 05/28/2016   Yes Historical Provider, MD  rOPINIRole (REQUIP) 0.25 MG tablet Take 0.25 mg by mouth 3 (three) times daily.  09/05/14  Yes Historical Provider, MD  sodium chloride (OCEAN) 0.65 % SOLN nasal spray Place 1 spray into both nostrils as needed for congestion.   Yes Historical Provider, MD  traMADol (ULTRAM) 50 MG tablet Take 1 tablet by mouth every 4 (four) hours as needed for moderate pain.  06/03/16  Yes Historical Provider, MD  B-D ULTRAFINE III SHORT PEN 31G X 8 MM MISC See admin instructions. 05/28/16   Historical Provider, MD     Physical Exam: Vitals:   01/10/17 1900 01/10/17 1905 01/10/17 1930 01/10/17 2030  BP: (!) 131/47  127/65 128/89  Pulse: 84  81 86  Resp: '23  14 17  '$ Temp:      TempSrc:      SpO2: 96% 100% 100% 100%  Weight:      Height:          Constitutional: NAD, calm, comfortable Vitals:   01/10/17 1900 01/10/17 1905 01/10/17 1930 01/10/17 2030  BP: (!) 131/47  127/65 128/89  Pulse: 84  81 86  Resp: '23  14 17  '$ Temp:      TempSrc:      SpO2: 96% 100% 100% 100%  Weight:      Height:       Eyes: PERRL, lids and conjunctivae normal ENMT: Mucous membranes are moist. Posterior pharynx clear of any exudate or lesions.Normal dentition.  Neck: normal, supple, no masses, no thyromegaly Respiratory: clear to auscultation bilaterally,bilateral wheezing.  no crackles. Normal respiratory effort. No accessory muscle use.  Cardiovascular: Regular rate and rhythm, no murmurs / rubs / gallops. No extremity edema. 2+ pedal pulses. No carotid bruits.  Abdomen: no tenderness, no masses palpated. No hepatosplenomegaly. Bowel sounds positive.  Musculoskeletal: no clubbing / cyanosis. No joint deformity upper and lower extremities. Good ROM, no contractures. Normal muscle tone.  Skin: no rashes, lesions, ulcers. No induration Neurologic: CN 2-12 grossly intact. Sensation intact, DTR normal. Strength 5/5 in all 4.  Psychiatric: Normal judgment and insight. Alert and oriented x 3. Normal mood.     Labs on Admission: I have personally reviewed following labs and imaging studies  CBC:  Recent Labs Lab 01/10/17 1840  WBC 8.1  NEUTROABS 4.7  HGB 12.6  HCT 39.3  MCV 95.4  PLT 025   Basic Metabolic Panel:  Recent Labs Lab 01/10/17 1840  NA 138  K 4.6  CL 99*  CO2 33*  GLUCOSE 159*  BUN 20  CREATININE 1.51*  CALCIUM 9.7    Recent Labs Lab 01/10/17 1840  TROPONINI <0.03   Urine analysis:    Component Value Date/Time   COLORURINE YELLOW 04/19/2012 [place]  04/19/2012 1411   LABSPEC  1.010 04/19/2012 1411   PHURINE 5.0 04/19/2012 1411   GLUCOSEU NEGATIVE 04/19/2012 1411   [place] 04/19/2012 [place] 04/19/2012 1411   KETONESUR NEGATIVE 04/19/2012 1411   PROTEINUR NEGATIVE 04/19/2012 1411   UROBILINOGEN 0.2 04/19/2012 1411   NITRITE NEGATIVE 04/19/2012 1411   LEUKOCYTESUR NEGATIVE 04/19/2012 1411    Radiological Exams on Admission: Dg Chest 2 View  Result Date: 01/10/2017 CLINICAL DATA:  Cough and wheezing for 1 week EXAM: CHEST  2 VIEW COMPARISON:  01/07/2017 FINDINGS: Cardiac shadow is within normal limits. Aortic calcifications are seen. Mild interstitial changes are noted bilaterally. Bibasilar changes are noted similar to that seen on prior CT examination. The nodularity seen previously is not well appreciated on this exam. Left chest wall deformity is noted consistent with prior surgery. No sizable effusion is seen. Degenerative change of thoracic spine is noted. IMPRESSION: Bibasilar changes similar to recent CT examination. No new focal abnormality is seen. Electronically Signed   By: Inez Catalina M.D.   On: 01/10/2017 19:08    EKG: Independently reviewed.  Assessment/Plan Principal Problem:   Acute on chronic respiratory failure with hypoxia (HCC) Active Problems:   Adenocarcinoma of lung (HCC)   COPD exacerbation (HCC)   CKD (chronic kidney disease) stage 3, GFR 30-59 ml/min   Essential hypertension   Type II diabetes mellitus (HCC)   CHF (congestive heart failure) (HCC)   COPD with acute exacerbation (HCC)    PLAN:   Hypoxic, acute on chronic respiratory failure due to COPD exacerbation:  Will continue with oxygen, nebs, and IV Steroids.  Hold off on additional antibiotics.    New pulmonary nodule:  Will consult oncology for further recommendation.  I did not order a PET scan.   CKD:  Avoid nephrotoxic drug.  Follow Cr.  No IVF given hx of CHF.  Continue oral Lasix.   DM:  Will continue her  home meds.  Add SSI resistant, as she is on IV steroids now.  HTN:  Will continue with her home meds.  Hold Cozaar.      DVT prophylaxis: SUB Q Heparin.  Code Status: FULL CODE.  Family Communication: daughter at bedside.  Disposition Plan: Home when appropriate.  Consults called: None.  Admission status: inpatient.    Corby Villasenor MD FACP. [facility]  If 7PM-7AM, please contact night-coverage [email or web address] Password [facility]  01/10/2017, 9:27 PM

## 2017-01-10 NOTE — ED Provider Notes (Signed)
[facility] DEPT Provider Note   CSN: [id number] Arrival date & time: 01/10/17  1805     History   Chief Complaint Chief Complaint  Patient presents with  . Shortness of Breath    HPI Angela Lawson is a 83 y.o. female.  HPI  Pt was seen at 1820.  Per pt, c/o gradual onset and worsening of persistent cough, wheezing and SOB for the past 1 week.  Has been using home O2, MDI and nebs with transient relief. Has been associated with "sharp" CP when she coughs. Denies cough otherwise. Denies pedal edema, no weight gain, no palpitations, no back pain, no abd pain, no N/V/D, no fevers, no rash.    Past Medical History:  Diagnosis Date  . Adenocarcinoma of lung ([place])    Left lung 2009, resected  . Arthritis   . Back pain   . CHF (congestive heart failure) ([place])   . COPD (chronic obstructive pulmonary disease) ([place])   . Diverticulitis   . Dyslipidemia   . Essential hypertension   . H/O ventral hernia   . Non-obstructive CAD    a. 04/2015 NSTEMI/Cath: LAD 10p, LCX 48m RCA 346m20d, EF 35-40 w/ apical ballooning.  . On home O2    3L N/C  . Osteoporosis   . Osteoporosis 11/04/2015   Managed by Dr. FaLegrand Rams . Parkinson's disease (HCC[place]  . Shortness of breath   . Takotsubo cardiomyopathy    a. 04/2015 Echo: EF 45-50%, mid-dist anterior/apical/inferoapical HK w/ hyperdynamic base. Gr 1 DD, mild AI, mild-mod MR, triv TR, PASP 4842m;  b. 04/2015 LV gram: Ef 35-40% w/ apical ballooning.  . Type II diabetes mellitus ([place]CC[place] . Ventricular bigeminy    a. 04/2015 in setting of NSTEMI/Takotsubo.    Patient Active Problem List   Diagnosis Date Noted  . COPD with acute exacerbation (HCCGlen Jean5/19/2017  . Acute on chronic respiratory failure with hypoxia (HCCManly5/19/2017  . SOB (shortness of breath)   . Osteoporosis 11/04/2015  . CHF (congestive heart failure) (HCCLa Riviera8/24/2016  . CHF exacerbation (HCCHayesville8/12/2014  . Essential hypertension 04/19/2015  . Type II diabetes mellitus (HCCRobinson05/11/2015  . Dyslipidemia 04/19/2015  . History of lung cancer 04/19/2015  . Takotsubo cardiomyopathy 04/19/2015  . Ventricular bigeminy 04/19/2015  . NSTEMI (non-ST elevated myocardial infarction) (HCCOtis5/11/2015  . Stress-induced cardiomyopathy 04/18/2015  . Elevated troponin 04/16/2015  . COPD exacerbation (HCCCoffey5/08/2015  . CKD (chronic kidney disease) stage 3, GFR 30-59 ml/min 04/16/2015  . Herpes genitalis in women 11/24/2014  . Toe fracture 09/28/2014  . Non-traumatic tear of right rotator cuff 11/30/2013  . Pain in joint, shoulder region 11/30/2013  . Muscle weakness (generalized) 11/30/2013  . Bursitis of left shoulder 02/09/2013  . Adenocarcinoma of lung (HCCWilsey9/13/2013    Past Surgical History:  Procedure Laterality Date  . ABDOMINAL HYSTERECTOMY    . CARDIAC CATHETERIZATION N/A 04/17/2015   Procedure: Left Heart Cath and Coronary Angiography;  Surgeon: ThoTroy SineD;  Location: [facility];  Service: Cardiovascular;  Laterality: N/A;  . CHOLECYSTECTOMY    . COLONOSCOPY N/A 09/18/2014   Procedure: COLONOSCOPY;  Surgeon: SanDanie BinderD;  Location: [facility];  Service: Endoscopy;  Laterality: N/A;  8:30 AM - moved to 10:30 - CRosendo Gros notify pt  . ECTOPIC PREGNANCY SURGERY    . INCISIONAL HERNIA REPAIR N/A 08/26/2013   Procedure: HERNIA REPAIR INCISIONAL WITH MESH;  Surgeon: MarJamesetta So  MD;  Location: [facility];  Service: General;  Laterality: N/A;  . LUNG CANCER SURGERY      OB History    No data available       Home Medications    Prior to Admission medications   Medication Sig Start Date End Date Taking? Authorizing Provider  albuterol (PROAIR HFA) 108 (90 BASE) MCG/ACT inhaler Inhale 2 puffs into the lungs every 4 (four) hours as needed for wheezing or shortness of breath.    Historical Provider, MD  alendronate (FOSAMAX) 70 MG tablet Take 70 mg by mouth once a week. On Fridays 03/14/15   Historical Provider, MD  aspirin EC 81 MG tablet  Take 81 mg by mouth daily.    Historical Provider, MD  atorvastatin (LIPITOR) 40 MG tablet Take 1 tablet (40 mg total) by mouth daily. 10/02/16 12/31/16  Jettie Booze, MD  B-D ULTRAFINE III SHORT PEN 31G X 8 MM MISC See admin instructions. 05/28/16   Historical Provider, MD  benzonatate (TESSALON) 100 MG capsule TAKE ONE CAPSULE 3 TIMES A DAY 11/28/16   Historical Provider, MD  benzonatate (TESSALON) 200 MG capsule Take 1 capsule by mouth 3 (three) times daily as needed. For cough 06/12/16   Historical Provider, MD  buPROPion (WELLBUTRIN SR) 150 MG 12 hr tablet Take 150 mg by mouth 2 (two) times daily.    Historical Provider, MD  Cholecalciferol (VITAMIN D-3) 1000 UNITS CAPS Take 1 capsule by mouth daily.    Historical Provider, MD  fish oil-omega-3 fatty acids 1000 MG capsule Take 1 g by mouth 2 (two) times daily.     Historical Provider, MD  furosemide (LASIX) 40 MG tablet Take 1 tablet by mouth daily, take 1 extra pill daily as needed for weight increase of 3 lbs in a day or 5 lbs in a week Patient taking differently: Take 40 mg by mouth daily.  05/21/16   Isaiah Serge, NP  gabapentin (NEURONTIN) 300 MG capsule Take 300 mg by mouth daily.     Historical Provider, MD  Garlic 315 MG TABS Take 300 mg by mouth daily.    Historical Provider, MD  ipratropium-albuterol (DUONEB) 0.5-2.5 (3) MG/3ML SOLN Inhale 3 mLs into the lungs 4 (four) times daily.  09/05/14   Historical Provider, MD  LANTUS SOLOSTAR 100 UNIT/ML Solostar Pen Inject 15 Units into the skin daily at 10 pm.  05/21/16   Historical Provider, MD  linagliptin (TRADJENTA) 5 MG TABS tablet Take 5 mg by mouth daily.    Historical Provider, MD  loratadine (CLARITIN) 10 MG tablet Take 10 mg by mouth daily. 05/01/16   Historical Provider, MD  losartan (COZAAR) 50 MG tablet Take 50 mg by mouth daily. 06/29/15   Historical Provider, MD  meclizine (ANTIVERT) 25 MG tablet Take 25 mg by mouth daily.  09/19/16   Historical Provider, MD  metoprolol  succinate (TOPROL-XL) 25 MG 24 hr tablet Take 1 tablet (25 mg total) by mouth daily. 06/26/15   Jettie Booze, MD  nitroGLYCERIN (NITROSTAT) 0.4 MG SL tablet Place 0.4 mg under the tongue every 5 (five) minutes as needed for chest pain.    Historical Provider, MD  omeprazole (PRILOSEC) 20 MG capsule Take 1 capsule (20 mg total) by mouth daily. 10/01/16   Jettie Booze, MD  OXYGEN Inhale 3 L into the lungs continuous.    Historical Provider, MD  potassium chloride (K-DUR) 10 MEQ tablet Take 1 tablet (10 mEq total) by mouth daily. 05/21/16  Isaiah Serge, NP  pyridoxine (B-6) 100 MG tablet Take 100 mg by mouth daily. Reported on 05/28/2016    Historical Provider, MD  rOPINIRole (REQUIP) 0.25 MG tablet Take 0.25 mg by mouth 3 (three) times daily.  09/05/14   Historical Provider, MD  sodium chloride (OCEAN) 0.65 % SOLN nasal spray Place 1 spray into both nostrils as needed for congestion.    Historical Provider, MD  traMADol (ULTRAM) 50 MG tablet Take 1 tablet by mouth every 4 (four) hours as needed for moderate pain.  06/03/16   Historical Provider, MD    Family History Family History  Problem Relation Age of Onset  . Diabetes Mother   . Hypertension Mother   . Diabetes Father   . Asthma    . Cancer    . Heart attack Sister     X2  . Colon cancer Neg Hx     Social History Social History  Substance Use Topics  . Smoking status: Former Smoker    Years: 20.00    Types: Cigarettes    Quit date: 08/13/2006  . Smokeless tobacco: Never Used  . Alcohol use No     Allergies   Lisinopril   Review of Systems Review of Systems ROS: Statement: All systems negative except as marked or noted in the HPI; Constitutional: Negative for fever and chills. ; ; Eyes: Negative for eye pain, redness and discharge. ; ; ENMT: Negative for ear pain, hoarseness, nasal congestion, sinus pressure and sore throat. ; ; Cardiovascular: Negative for palpitations, diaphoresis, and peripheral edema. ; ;  Respiratory: +cough, wheezing, SOB, pleuritic CP. Negative for stridor. ; ; Gastrointestinal: Negative for nausea, vomiting, diarrhea, abdominal pain, blood in stool, hematemesis, jaundice and rectal bleeding. . ; ; Genitourinary: Negative for dysuria, flank pain and hematuria. ; ; Musculoskeletal: Negative for back pain and neck pain. Negative for swelling and trauma.; ; Skin: Negative for pruritus, rash, abrasions, blisters, bruising and skin lesion.; ; Neuro: Negative for headache, lightheadedness and neck stiffness. Negative for weakness, altered level of consciousness, altered mental status, extremity weakness, paresthesias, involuntary movement, seizure and syncope.       Physical Exam Updated Vital Signs BP 143/55 (BP Location: Left Arm)   Pulse 99   Temp 98.2 F (36.8 C) (Oral)   Resp 24   Ht '5\' 4"'$  (1.626 m)   Wt 175 lb (79.4 kg)   SpO2 97%   BMI 30.04 kg/m   Physical Exam 1825: Physical examination:  Nursing notes reviewed; Vital signs and O2 SAT reviewed;  Constitutional: Well developed, Well nourished, Well hydrated, Uncomfortable appearing.;; Head:  Normocephalic, atraumatic; Eyes: EOMI, PERRL, No scleral icterus; ENMT: Mouth and pharynx normal, Mucous membranes moist; Neck: Supple, Full range of motion, No lymphadenopathy; Cardiovascular: Tachycardic rate and rhythm, No gallop; Respiratory: Breath sounds diminished & equal bilaterally, insp/exp wheezes bilat. Faint audible wheezing.  Speaking short sentences, sitting upright, tachypneic.; Chest: Nontender, Movement normal; Abdomen: Soft, Nontender, Nondistended, Normal bowel sounds; Genitourinary: No CVA tenderness; Extremities: Pulses normal, No tenderness, No edema, No calf edema or asymmetry.; Neuro: AA&Ox3, Major CN grossly intact.  Speech clear. No gross focal motor or sensory deficits in extremities.; Skin: Color normal, Warm, Dry.     ED Treatments / Results  Labs (all labs ordered are listed, but only abnormal results  are displayed)   EKG  EKG Interpretation  Date/Time:  Saturday January 10 2017 18:24:13 EST Ventricular Rate:  95 PR Interval:    QRS Duration: 84 QT Interval:  341 QTC Calculation: 429 R Axis:   43 Text Interpretation:  Sinus rhythm Abnormal R-wave progression, early transition Anteroseptal infarct, age indeterminate Baseline wander Artifact When compared with ECG of 09/21/2016 No significant change was found Confirmed by [facility]  MD, Nunzio Cory [phone]) on 01/10/2017 6:30:29 PM       Radiology   Procedures Procedures (including critical care time)  Medications Ordered in ED Medications  methylPREDNISolone sodium succinate (SOLU-MEDROL) 125 mg/2 mL injection 125 mg (not administered)  albuterol (PROVENTIL,VENTOLIN) solution continuous neb (not administered)  ipratropium (ATROVENT) nebulizer solution 1 mg (not administered)     Initial Impression / Assessment and Plan / ED Course  I have reviewed the triage vital signs and the nursing notes.  Pertinent labs & imaging results that were available during my care of the patient were reviewed by me and considered in my medical decision making (see chart for details).  MDM Reviewed: previous chart, nursing note and vitals Reviewed previous: labs and ECG Interpretation: labs, ECG and x-ray Total time providing critical care: 30-74 minutes. This excludes time spent performing separately reportable procedures and services. Consults: admitting MD   CRITICAL CARE Performed by: Alfonzo Feller Total critical care time: 35 minutes Critical care time was exclusive of separately billable procedures and treating other patients. Critical care was necessary to treat or prevent imminent or life-threatening deterioration. Critical care was time spent personally by me on the following activities: development of treatment plan with patient and/or surrogate as well as nursing, discussions with consultants, evaluation of patient's response  to treatment, examination of patient, obtaining history from patient or surrogate, ordering and performing treatments and interventions, ordering and review of laboratory studies, ordering and review of radiographic studies, pulse oximetry and re-evaluation of patient's condition.   Results for orders placed or performed during the hospital encounter of 60/45/40  Basic metabolic panel  Result Value Ref Range   Sodium 138 135 - 145 mmol/L   Potassium 4.6 3.5 - 5.1 mmol/L   Chloride 99 (L) 101 - 111 mmol/L   CO2 33 (H) 22 - 32 mmol/L   Glucose, Bld 159 (H) 65 - 99 mg/dL   BUN 20 6 - 20 mg/dL   Creatinine, Ser 1.51 (H) 0.44 - 1.00 mg/dL   Calcium 9.7 8.9 - 10.3 mg/dL   GFR calc non Af Amer 31 (L) >60 mL/min   GFR calc Af Amer 36 (L) >60 mL/min   Anion gap 6 5 - 15  CBC with Differential  Result Value Ref Range   WBC 8.1 4.0 - 10.5 K/uL   RBC 4.12 3.87 - 5.11 MIL/uL   Hemoglobin 12.6 12.0 - 15.0 g/dL   HCT 39.3 36.0 - 46.0 %   MCV 95.4 78.0 - 100.0 fL   MCH 30.6 26.0 - 34.0 pg   MCHC 32.1 30.0 - 36.0 g/dL   RDW 14.9 11.5 - 15.5 %   Platelets 233 150 - 400 K/uL   Neutrophils Relative % 59 %   Neutro Abs 4.7 1.7 - 7.7 K/uL   Lymphocytes Relative 23 %   Lymphs Abs 1.9 0.7 - 4.0 K/uL   Monocytes Relative 13 %   Monocytes Absolute 1.0 0.1 - 1.0 K/uL   Eosinophils Relative 5 %   Eosinophils Absolute 0.4 0.0 - 0.7 K/uL   Basophils Relative 0 %   Basophils Absolute 0.0 0.0 - 0.1 K/uL  Troponin I  Result Value Ref Range   Troponin I <0.03 <0.03 ng/mL  Brain natriuretic peptide  Result  Value Ref Range   B Natriuretic Peptide 15.0 0.0 - 100.0 pg/mL  D-dimer, quantitative  Result Value Ref Range   D-Dimer, Quant 0.64 (H) 0.00 - 0.50 ug/mL-FEU   Dg Chest 2 View Result Date: 01/10/2017 CLINICAL DATA:  Cough and wheezing for 1 week EXAM: CHEST  2 VIEW COMPARISON:  01/07/2017 FINDINGS: Cardiac shadow is within normal limits. Aortic calcifications are seen. Mild interstitial changes are  noted bilaterally. Bibasilar changes are noted similar to that seen on prior CT examination. The nodularity seen previously is not well appreciated on this exam. Left chest wall deformity is noted consistent with prior surgery. No sizable effusion is seen. Degenerative change of thoracic spine is noted. IMPRESSION: Bibasilar changes similar to recent CT examination. No new focal abnormality is seen. Electronically Signed   By: Inez Catalina M.D.   On: 01/10/2017 19:08    Ct Chest Wo Contrast Result Date: 01/08/2017 CLINICAL DATA:  Surveillance for non-small cell lung cancer. EXAM: CT CHEST WITHOUT CONTRAST TECHNIQUE: Multidetector CT imaging of the chest was performed following the standard protocol without IV contrast. COMPARISON:  11/07/2015 FINDINGS: Cardiovascular: Coronary, aortic arch, and branch vessel atherosclerotic vascular disease. Mediastinum/Nodes: Small hiatal hernia. Mildly dilated and gas-filled esophagus. Stable hypodense lesion in the left thyroid lobe measuring approximately 1.3 by 0.9 cm on image 17/2. Lymph node anterior to the upper trachea on measures 10 mm in short axis on image 44/2, formerly 9 mm. Left internal mammary lymph node 7 mm in short axis on image 57/2, formerly the same. Right lower paratracheal lymph node adjacent to the carina 1 cm in short axis on image 61/2, formerly 1.2 cm. Lungs/Pleura: Severe emphysema. Biapical pleuroparenchymal scarring right greater than left, stable. Motion artifact reduces sensitivity for smaller lesions. Bullous lesion in the right lower lobe was slightly thick margins and with worsening nodular elements along at the edges including an new 1.0 by 0.7 cm nodular element on image 97/4 and more sharply defined nodular elements including a 0.6 by 0.8 cm nodule on image 99 of series 4 and an adjacent 0.8 by 0.5 cm nodule on that same image. Other slightly nodular elements of this process are seen superiorly. This is progressive from prior and suspicious  for malignancy. 0.7 by 0.5 cm subpleural nodule in the left upper lobe on image 43/4, by my measurements this is stable from 2016 and about stable from 2014, hence likely benign. There is confluent ground-glass opacity posteriorly in the remaining left lung. An ill-defined nodule on image 48/4 is stable but there is new nodularity along the upper margin of this ground-glass opacity including a poorly marginated 0.9 by 0.5 cm nodule on image 64/4. The ground-glass opacity encompasses the region of the staple line. Upper Abdomen: Stable mildly elevated left hemidiaphragm. Musculoskeletal: Some of the ribs are blurred by motion artifact. Postoperative deformities from some of the left ribs. Thoracic spondylosis. IMPRESSION: 1. New nodules along a thick walled bullous lesion in the right lower lobe, suspicious for malignancy. 2. New fairly large region of confluent ground-glass opacity posteriorly in the remaining left lung, low grade adenocarcinoma is not excluded and there is mild new nodularity along the upper margin of this process. 3. Stable biapical pleuroparenchymal scarring with several stable nodules in the left lung. 4. Borderline adenopathy in the mediastinum, essentially stable from prior. 5. Coronary, aortic arch, and branch vessel atherosclerotic vascular disease. 6. Small hiatal hernia. 7. Hypodense left thyroid lesion. If not previously worked up, thyroid ultrasound could be employed.  Electronically Signed   By: Van Clines M.D.   On: 01/08/2017 08:15    2105:  On arrival: pt sitting upright, tachypneic, tachycardic, Sats 97 % on her usual O2 3L N/C, lungs diminished. IV solumedrol and hour long neb started. After neb: pt appears more comfortable at rest, less tachypneic, Sats 100 % on O2 3L N/C, lungs continue diminished. Pt ambulated in hallway: pt's O2 Sats dropped to 87 % on O2 3L N/C with pt c/o increasing SOB, with increasing HR and RR. Pt escorted back to stretcher with O2 Sats slowly  increasing to 91 % on O2 3L N/C. BUN/Cr per baseline. Dx and testing, as well as recent CT scan, d/w pt and family.  Questions answered.  Verb understanding, agreeable to admit. T/C to [facility]. Marin Comment, case discussed, including:  HPI, pertinent PM/SHx, VS/PE, dx testing, ED course and treatment:  Agreeable to admit, requests he will come to the ED for evaluation.     Final Clinical Impressions(s) / ED Diagnoses   Final diagnoses:  None    New Prescriptions New Prescriptions   No medications on file     Francine Graven, DO 01/11/17 2118

## 2017-01-10 NOTE — ED Triage Notes (Signed)
Pt reports having a productive cough and sob x 1 week. Pt reports chest pain when she takes a deep breath. Pt is on 3L of 02 at all times.

## 2017-01-11 ENCOUNTER — Encounter (HOSPITAL_COMMUNITY): Payer: Self-pay | Admitting: *Deleted

## 2017-01-11 LAB — CBC
HCT: 38.4 % (ref 36.0–46.0)
Hemoglobin: 12.4 g/dL (ref 12.0–15.0)
MCH: 30.5 pg (ref 26.0–34.0)
MCHC: 32.3 g/dL (ref 30.0–36.0)
MCV: 94.3 fL (ref 78.0–100.0)
Platelets: 232 10*3/uL (ref 150–400)
RBC: 4.07 MIL/uL (ref 3.87–5.11)
RDW: 14.9 % (ref 11.5–15.5)
WBC: 10.2 10*3/uL (ref 4.0–10.5)

## 2017-01-11 LAB — BASIC METABOLIC PANEL
Anion gap: 11 (ref 5–15)
BUN: 25 mg/dL — ABNORMAL HIGH (ref 6–20)
CO2: 25 mmol/L (ref 22–32)
Calcium: 9.7 mg/dL (ref 8.9–10.3)
Chloride: 100 mmol/L — ABNORMAL LOW (ref 101–111)
Creatinine, Ser: 1.55 mg/dL — ABNORMAL HIGH (ref 0.44–1.00)
GFR calc Af Amer: 35 mL/min — ABNORMAL LOW (ref >60)
GFR calc non Af Amer: 30 mL/min — ABNORMAL LOW (ref >60)
Glucose, Bld: 225 mg/dL — ABNORMAL HIGH (ref 65–99)
Potassium: 5.1 mmol/L (ref 3.5–5.1)
Sodium: 136 mmol/L (ref 135–145)

## 2017-01-11 LAB — GLUCOSE, CAPILLARY
Glucose-Capillary: 176 mg/dL — ABNORMAL HIGH (ref 65–99)
Glucose-Capillary: 193 mg/dL — ABNORMAL HIGH (ref 65–99)
Glucose-Capillary: 156 mg/dL — ABNORMAL HIGH (ref 65–99)

## 2017-01-11 LAB — TSH: TSH: 1.018 u[IU]/mL (ref 0.350–4.500)

## 2017-01-11 MED ORDER — ALBUTEROL SULFATE (2.5 MG/3ML) 0.083% IN NEBU: 2.5000 mg | INHALATION_SOLUTION | RESPIRATORY_TRACT | Status: DC | PRN

## 2017-01-11 MED ORDER — POTASSIUM CHLORIDE CRYS ER 10 MEQ PO TBCR
10.0000 meq | EXTENDED_RELEASE_TABLET | Freq: Every day | ORAL | Status: DC
Start: 2017-01-11 — End: 2017-01-13
  Administered 2017-01-11 – 2017-01-13 (×3): 10 meq via ORAL
  Filled 2017-01-11 (×3): qty 1

## 2017-01-11 MED ORDER — METHYLPREDNISOLONE SODIUM SUCC 40 MG IJ SOLR
40.0000 mg | Freq: Two times a day (BID) | INTRAMUSCULAR | Status: DC
  Administered 2017-01-12 (×3): 40 mg via INTRAVENOUS
  Filled 2017-01-11 (×3): qty 1

## 2017-01-11 MED ORDER — SALINE SPRAY 0.65 % NA SOLN
1.0000 | NASAL | Status: DC | PRN
  Filled 2017-01-11: qty 44

## 2017-01-11 MED ORDER — IPRATROPIUM-ALBUTEROL 0.5-2.5 (3) MG/3ML IN SOLN
3.0000 mL | Freq: Four times a day (QID) | RESPIRATORY_TRACT | Status: DC
  Administered 2017-01-11 – 2017-01-13 (×9): 3 mL via RESPIRATORY_TRACT
  Filled 2017-01-11 (×9): qty 3

## 2017-01-11 NOTE — Progress Notes (Signed)
Patient and nurse called for  Breathing tx. Patient wheezing mostly upper, started on duoneb added order for albuterol prn q 4

## 2017-01-11 NOTE — Progress Notes (Signed)
Subjective: Patient was admitted yesterday due COPD exacerbation. Patient was started on nebulizer treatment and IV steroid. She is doing much better today. Her breathing is improving. Nofever or chills.  Objective: Vital signs in last 24 hours: Temp:  [98.2 F (36.8 C)-98.4 F (36.9 C)] 98.4 F (36.9 C) (02/04 0705) Pulse Rate:  [81-99] 98 (02/04 0705) Resp:  [14-24] 16 (02/04 0705) BP: (105-146)/(47-102) 122/102 (02/04 0705) SpO2:  [92 %-100 %] 92 % (02/04 1136) Weight:  [79.4 kg (175 lb)-82.4 kg (181 lb 10.5 oz)] 82.4 kg (181 lb 10.5 oz) (02/03 2340) Weight change:  Last BM Date: 01/10/17  Intake/Output from previous day: No intake/output data recorded.  PHYSICAL EXAM General appearance: alert and no distress Resp: diminished breath sounds bilaterally and rhonchi bilaterally Cardio: S1, S2 normal GI: soft, non-tender; bowel sounds normal; no masses,  no organomegaly Extremities: extremities normal, atraumatic, no cyanosis or edema  Lab Results:  Results for orders placed or performed during the hospital encounter of 01/10/17 (from the past 48 hour(s))  Basic metabolic panel     Status: Abnormal   Collection Time: 01/10/17  6:40 PM  Result Value Ref Range   Sodium 138 135 - 145 mmol/L   Potassium 4.6 3.5 - 5.1 mmol/L   Chloride 99 (L) 101 - 111 mmol/L   CO2 33 (H) 22 - 32 mmol/L   Glucose, Bld 159 (H) 65 - 99 mg/dL   BUN 20 6 - 20 mg/dL   Creatinine, Ser 1.51 (H) 0.44 - 1.00 mg/dL   Calcium 9.7 8.9 - 10.3 mg/dL   GFR calc non Af Amer 31 (L) >60 mL/min   GFR calc Af Amer 36 (L) >60 mL/min    Comment: (NOTE) The eGFR has been calculated using the CKD EPI equation. This calculation has not been validated in all clinical situations. eGFR's persistently <60 mL/min signify possible Chronic Kidney Disease.    Anion gap 6 5 - 15  CBC with Differential     Status: None   Collection Time: 01/10/17  6:40 PM  Result Value Ref Range   WBC 8.1 4.0 - 10.5 K/uL   RBC 4.12 3.87  - 5.11 MIL/uL   Hemoglobin 12.6 12.0 - 15.0 g/dL   HCT 39.3 36.0 - 46.0 %   MCV 95.4 78.0 - 100.0 fL   MCH 30.6 26.0 - 34.0 pg   MCHC 32.1 30.0 - 36.0 g/dL   RDW 14.9 11.5 - 15.5 %   Platelets 233 150 - 400 K/uL   Neutrophils Relative % 59 %   Neutro Abs 4.7 1.7 - 7.7 K/uL   Lymphocytes Relative 23 %   Lymphs Abs 1.9 0.7 - 4.0 K/uL   Monocytes Relative 13 %   Monocytes Absolute 1.0 0.1 - 1.0 K/uL   Eosinophils Relative 5 %   Eosinophils Absolute 0.4 0.0 - 0.7 K/uL   Basophils Relative 0 %   Basophils Absolute 0.0 0.0 - 0.1 K/uL  Troponin I     Status: None   Collection Time: 01/10/17  6:40 PM  Result Value Ref Range   Troponin I <0.03 <0.03 ng/mL  Brain natriuretic peptide     Status: None   Collection Time: 01/10/17  6:40 PM  Result Value Ref Range   B Natriuretic Peptide 15.0 0.0 - 100.0 pg/mL  D-dimer, quantitative     Status: Abnormal   Collection Time: 01/10/17  6:40 PM  Result Value Ref Range   D-Dimer, Quant 0.64 (H) 0.00 - 0.50 ug/mL-FEU  Comment: (NOTE) At the manufacturer cut-off of 0.50 ug/mL FEU, this assay has been documented to exclude PE with a sensitivity and negative predictive value of 97 to 99%.  At this time, this assay has not been approved by the FDA to exclude DVT/VTE. Results should be correlated with clinical presentation.   TSH     Status: None   Collection Time: 01/10/17  6:40 PM  Result Value Ref Range   TSH 1.018 0.350 - 4.500 uIU/mL    Comment: Performed by a 3rd Generation assay with a functional sensitivity of <=0.01 uIU/mL.  Basic metabolic panel     Status: Abnormal   Collection Time: 01/11/17  6:45 AM  Result Value Ref Range   Sodium 136 135 - 145 mmol/L   Potassium 5.1 3.5 - 5.1 mmol/L   Chloride 100 (L) 101 - 111 mmol/L   CO2 25 22 - 32 mmol/L   Glucose, Bld 225 (H) 65 - 99 mg/dL   BUN 25 (H) 6 - 20 mg/dL   Creatinine, Ser 1.55 (H) 0.44 - 1.00 mg/dL   Calcium 9.7 8.9 - 10.3 mg/dL   GFR calc non Af Amer 30 (L) >60 mL/min    GFR calc Af Amer 35 (L) >60 mL/min    Comment: (NOTE) The eGFR has been calculated using the CKD EPI equation. This calculation has not been validated in all clinical situations. eGFR's persistently <60 mL/min signify possible Chronic Kidney Disease.    Anion gap 11 5 - 15  CBC     Status: None   Collection Time: 01/11/17  6:45 AM  Result Value Ref Range   WBC 10.2 4.0 - 10.5 K/uL   RBC 4.07 3.87 - 5.11 MIL/uL   Hemoglobin 12.4 12.0 - 15.0 g/dL   HCT 38.4 36.0 - 46.0 %   MCV 94.3 78.0 - 100.0 fL   MCH 30.5 26.0 - 34.0 pg   MCHC 32.3 30.0 - 36.0 g/dL   RDW 14.9 11.5 - 15.5 %   Platelets 232 150 - 400 K/uL  Glucose, capillary     Status: Abnormal   Collection Time: 01/11/17  7:44 AM  Result Value Ref Range   Glucose-Capillary 176 (H) 65 - 99 mg/dL  Glucose, capillary     Status: Abnormal   Collection Time: 01/11/17 11:19 AM  Result Value Ref Range   Glucose-Capillary 193 (H) 65 - 99 mg/dL    ABGS No results for input(s): PHART, PO2ART, TCO2, HCO3 in the last 72 hours.  Invalid input(s): PCO2 CULTURES No results found for this or any previous visit (from the past 240 hour(s)). Studies/Results: Dg Chest 2 View  Result Date: 01/10/2017 CLINICAL DATA:  Cough and wheezing for 1 week EXAM: CHEST  2 VIEW COMPARISON:  01/07/2017 FINDINGS: Cardiac shadow is within normal limits. Aortic calcifications are seen. Mild interstitial changes are noted bilaterally. Bibasilar changes are noted similar to that seen on prior CT examination. The nodularity seen previously is not well appreciated on this exam. Left chest wall deformity is noted consistent with prior surgery. No sizable effusion is seen. Degenerative change of thoracic spine is noted. IMPRESSION: Bibasilar changes similar to recent CT examination. No new focal abnormality is seen. Electronically Signed   By: Inez Catalina M.D.   On: 01/10/2017 19:08    Medications:  Prior to Admission:  Prescriptions Prior to Admission  Medication  Sig Dispense Refill Last Dose  . albuterol (PROAIR HFA) 108 (90 BASE) MCG/ACT inhaler Inhale 2 puffs into the lungs  every 4 (four) hours as needed for wheezing or shortness of breath.   01/10/2017 at Unknown time  . alendronate (FOSAMAX) 70 MG tablet Take 70 mg by mouth once a week. On Fridays   01/09/2017 at Unknown time  . aspirin EC 81 MG tablet Take 81 mg by mouth daily.   01/10/2017 at Unknown time  . atorvastatin (LIPITOR) 40 MG tablet Take 1 tablet (40 mg total) by mouth daily. 90 tablet 3 01/09/2017 at Unknown time  . benzonatate (TESSALON) 100 MG capsule TAKE ONE CAPSULE 3 TIMES A DAY  12 01/10/2017 at Unknown time  . buPROPion (WELLBUTRIN SR) 150 MG 12 hr tablet Take 150 mg by mouth 2 (two) times daily.   01/10/2017 at Unknown time  . Cholecalciferol (VITAMIN D-3) 1000 UNITS CAPS Take 1 capsule by mouth daily.   01/10/2017 at Unknown time  . fish oil-omega-3 fatty acids 1000 MG capsule Take 1 g by mouth 2 (two) times daily.    01/10/2017 at Unknown time  . furosemide (LASIX) 40 MG tablet Take 1 tablet by mouth daily, take 1 extra pill daily as needed for weight increase of 3 lbs in a day or 5 lbs in a week (Patient taking differently: Take 40 mg by mouth daily. ) 45 tablet 3 01/10/2017 at Unknown time  . gabapentin (NEURONTIN) 300 MG capsule Take 300 mg by mouth daily.    01/10/2017 at Unknown time  . Garlic 161 MG TABS Take 300 mg by mouth daily.   01/10/2017 at Unknown time  . ipratropium-albuterol (DUONEB) 0.5-2.5 (3) MG/3ML SOLN Inhale 3 mLs into the lungs 4 (four) times daily.    01/10/2017 at Unknown time  . LANTUS SOLOSTAR 100 UNIT/ML Solostar Pen Inject 15 Units into the skin every morning.    01/10/2017 at Unknown time  . linagliptin (TRADJENTA) 5 MG TABS tablet Take 5 mg by mouth daily.   01/10/2017 at Unknown time  . loratadine (CLARITIN) 10 MG tablet Take 10 mg by mouth daily.  3 01/10/2017 at Unknown time  . losartan (COZAAR) 50 MG tablet Take 50 mg by mouth daily.  2 01/10/2017 at Unknown time  . meclizine  (ANTIVERT) 25 MG tablet Take 25 mg by mouth daily.    01/10/2017 at Unknown time  . metoprolol succinate (TOPROL-XL) 25 MG 24 hr tablet Take 1 tablet (25 mg total) by mouth daily. 90 tablet 3 01/10/2017 at 800a  . nitroGLYCERIN (NITROSTAT) 0.4 MG SL tablet Place 0.4 mg under the tongue every 5 (five) minutes as needed for chest pain.   unknown  . omeprazole (PRILOSEC) 20 MG capsule Take 1 capsule (20 mg total) by mouth daily. 60 capsule 3 01/10/2017 at Unknown time  . OXYGEN Inhale 3 L into the lungs continuous.   01/10/2017 at Unknown time  . potassium chloride (K-DUR) 10 MEQ tablet Take 1 tablet (10 mEq total) by mouth daily. 90 tablet 3 01/10/2017 at Unknown time  . pyridoxine (B-6) 100 MG tablet Take 100 mg by mouth daily. Reported on 05/28/2016   01/10/2017 at Unknown time  . rOPINIRole (REQUIP) 0.25 MG tablet Take 0.25 mg by mouth 3 (three) times daily.    01/10/2017 at Unknown time  . sodium chloride (OCEAN) 0.65 % SOLN nasal spray Place 1 spray into both nostrils as needed for congestion.   01/10/2017 at Unknown time  . traMADol (ULTRAM) 50 MG tablet Take 1 tablet by mouth every 4 (four) hours as needed for moderate pain.    unknown  .  B-D ULTRAFINE III SHORT PEN 31G X 8 MM MISC See admin instructions.  3 Taking    Assesment:  Principal Problem:   Acute on chronic respiratory failure with hypoxia (HCC) Active Problems:   Adenocarcinoma of lung (HCC)   COPD exacerbation (HCC)   CKD (chronic kidney disease) stage 3, GFR 30-59 ml/min   Essential hypertension   Type II diabetes mellitus (HCC)   CHF (congestive heart failure) (HCC)   COPD with acute exacerbation ([place])    Plan:  Medications reviewed Will continue nebulizer treatment Will taper iv steroid Continue regular treatment    LOS: 1 day   Cannan Beeck 01/11/2017, 12:55 PM

## 2017-01-12 LAB — GLUCOSE, CAPILLARY
Glucose-Capillary: 191 mg/dL — ABNORMAL HIGH (ref 65–99)
Glucose-Capillary: 167 mg/dL — ABNORMAL HIGH (ref 65–99)
Glucose-Capillary: 188 mg/dL — ABNORMAL HIGH (ref 65–99)

## 2017-01-12 NOTE — Progress Notes (Signed)
Subjective: Patient complains of shortness of breath, chet tightness and wheezing. She gets more short winded when she try to move.  Objective: Vital signs in last 24 hours: Temp:  [97.9 F (36.6 C)-98.6 F (37 C)] 97.9 F (36.6 C) (02/05 0609) Pulse Rate:  [83-111] 83 (02/05 0609) Resp:  [20] 20 (02/05 0609) BP: (119-167)/(52-73) 119/52 (02/05 0609) SpO2:  [83 %-100 %] 83 % (02/05 0745) Weight change:  Last BM Date: 01/10/17  Intake/Output from previous day: 02/04 0701 - 02/05 0700 In: 720 [P.O.:720] Out: -   PHYSICAL EXAM General appearance: alert and no distress Resp: diminished breath sounds bilaterally and rhonchi bilaterally Cardio: S1, S2 normal GI: soft, non-tender; bowel sounds normal; no masses,  no organomegaly Extremities: extremities normal, atraumatic, no cyanosis or edema  Lab Results:  Results for orders placed or performed during the hospital encounter of 01/10/17 (from the past 48 hour(s))  Basic metabolic panel     Status: Abnormal   Collection Time: 01/10/17  6:40 PM  Result Value Ref Range   Sodium 138 135 - 145 mmol/L   Potassium 4.6 3.5 - 5.1 mmol/L   Chloride 99 (L) 101 - 111 mmol/L   CO2 33 (H) 22 - 32 mmol/L   Glucose, Bld 159 (H) 65 - 99 mg/dL   BUN 20 6 - 20 mg/dL   Creatinine, Ser 1.51 (H) 0.44 - 1.00 mg/dL   Calcium 9.7 8.9 - 10.3 mg/dL   GFR calc non Af Amer 31 (L) >60 mL/min   GFR calc Af Amer 36 (L) >60 mL/min    Comment: (NOTE) The eGFR has been calculated using the CKD EPI equation. This calculation has not been validated in all clinical situations. eGFR's persistently <60 mL/min signify possible Chronic Kidney Disease.    Anion gap 6 5 - 15  CBC with Differential     Status: None   Collection Time: 01/10/17  6:40 PM  Result Value Ref Range   WBC 8.1 4.0 - 10.5 K/uL   RBC 4.12 3.87 - 5.11 MIL/uL   Hemoglobin 12.6 12.0 - 15.0 g/dL   HCT 39.3 36.0 - 46.0 %   MCV 95.4 78.0 - 100.0 fL   MCH 30.6 26.0 - 34.0 pg   MCHC 32.1 30.0  - 36.0 g/dL   RDW 14.9 11.5 - 15.5 %   Platelets 233 150 - 400 K/uL   Neutrophils Relative % 59 %   Neutro Abs 4.7 1.7 - 7.7 K/uL   Lymphocytes Relative 23 %   Lymphs Abs 1.9 0.7 - 4.0 K/uL   Monocytes Relative 13 %   Monocytes Absolute 1.0 0.1 - 1.0 K/uL   Eosinophils Relative 5 %   Eosinophils Absolute 0.4 0.0 - 0.7 K/uL   Basophils Relative 0 %   Basophils Absolute 0.0 0.0 - 0.1 K/uL  Troponin I     Status: None   Collection Time: 01/10/17  6:40 PM  Result Value Ref Range   Troponin I <0.03 <0.03 ng/mL  Brain natriuretic peptide     Status: None   Collection Time: 01/10/17  6:40 PM  Result Value Ref Range   B Natriuretic Peptide 15.0 0.0 - 100.0 pg/mL  D-dimer, quantitative     Status: Abnormal   Collection Time: 01/10/17  6:40 PM  Result Value Ref Range   D-Dimer, Quant 0.64 (H) 0.00 - 0.50 ug/mL-FEU    Comment: (NOTE) At the manufacturer cut-off of 0.50 ug/mL FEU, this assay has been documented to exclude PE with  a sensitivity and negative predictive value of 97 to 99%.  At this time, this assay has not been approved by the FDA to exclude DVT/VTE. Results should be correlated with clinical presentation.   TSH     Status: None   Collection Time: 01/10/17  6:40 PM  Result Value Ref Range   TSH 1.018 0.350 - 4.500 uIU/mL    Comment: Performed by a 3rd Generation assay with a functional sensitivity of <=0.01 uIU/mL.  Basic metabolic panel     Status: Abnormal   Collection Time: 01/11/17  6:45 AM  Result Value Ref Range   Sodium 136 135 - 145 mmol/L   Potassium 5.1 3.5 - 5.1 mmol/L   Chloride 100 (L) 101 - 111 mmol/L   CO2 25 22 - 32 mmol/L   Glucose, Bld 225 (H) 65 - 99 mg/dL   BUN 25 (H) 6 - 20 mg/dL   Creatinine, Ser 1.55 (H) 0.44 - 1.00 mg/dL   Calcium 9.7 8.9 - 10.3 mg/dL   GFR calc non Af Amer 30 (L) >60 mL/min   GFR calc Af Amer 35 (L) >60 mL/min    Comment: (NOTE) The eGFR has been calculated using the CKD EPI equation. This calculation has not been  validated in all clinical situations. eGFR's persistently <60 mL/min signify possible Chronic Kidney Disease.    Anion gap 11 5 - 15  CBC     Status: None   Collection Time: 01/11/17  6:45 AM  Result Value Ref Range   WBC 10.2 4.0 - 10.5 K/uL   RBC 4.07 3.87 - 5.11 MIL/uL   Hemoglobin 12.4 12.0 - 15.0 g/dL   HCT 38.4 36.0 - 46.0 %   MCV 94.3 78.0 - 100.0 fL   MCH 30.5 26.0 - 34.0 pg   MCHC 32.3 30.0 - 36.0 g/dL   RDW 14.9 11.5 - 15.5 %   Platelets 232 150 - 400 K/uL  Glucose, capillary     Status: Abnormal   Collection Time: 01/11/17  7:44 AM  Result Value Ref Range   Glucose-Capillary 176 (H) 65 - 99 mg/dL  Glucose, capillary     Status: Abnormal   Collection Time: 01/11/17 11:19 AM  Result Value Ref Range   Glucose-Capillary 193 (H) 65 - 99 mg/dL  Glucose, capillary     Status: Abnormal   Collection Time: 01/11/17  4:26 PM  Result Value Ref Range   Glucose-Capillary 156 (H) 65 - 99 mg/dL    ABGS No results for input(s): PHART, PO2ART, TCO2, HCO3 in the last 72 hours.  Invalid input(s): PCO2 CULTURES No results found for this or any previous visit (from the past 240 hour(s)). Studies/Results: Dg Chest 2 View  Result Date: 01/10/2017 CLINICAL DATA:  Cough and wheezing for 1 week EXAM: CHEST  2 VIEW COMPARISON:  01/07/2017 FINDINGS: Cardiac shadow is within normal limits. Aortic calcifications are seen. Mild interstitial changes are noted bilaterally. Bibasilar changes are noted similar to that seen on prior CT examination. The nodularity seen previously is not well appreciated on this exam. Left chest wall deformity is noted consistent with prior surgery. No sizable effusion is seen. Degenerative change of thoracic spine is noted. IMPRESSION: Bibasilar changes similar to recent CT examination. No new focal abnormality is seen. Electronically Signed   By: Inez Catalina M.D.   On: 01/10/2017 19:08    Medications:  Prior to Admission:  Prescriptions Prior to Admission   Medication Sig Dispense Refill Last Dose  . albuterol (PROAIR  HFA) 108 (90 BASE) MCG/ACT inhaler Inhale 2 puffs into the lungs every 4 (four) hours as needed for wheezing or shortness of breath.   01/10/2017 at Unknown time  . alendronate (FOSAMAX) 70 MG tablet Take 70 mg by mouth once a week. On Fridays   01/09/2017 at Unknown time  . aspirin EC 81 MG tablet Take 81 mg by mouth daily.   01/10/2017 at Unknown time  . atorvastatin (LIPITOR) 40 MG tablet Take 1 tablet (40 mg total) by mouth daily. 90 tablet 3 01/09/2017 at Unknown time  . benzonatate (TESSALON) 100 MG capsule TAKE ONE CAPSULE 3 TIMES A DAY  12 01/10/2017 at Unknown time  . buPROPion (WELLBUTRIN SR) 150 MG 12 hr tablet Take 150 mg by mouth 2 (two) times daily.   01/10/2017 at Unknown time  . Cholecalciferol (VITAMIN D-3) 1000 UNITS CAPS Take 1 capsule by mouth daily.   01/10/2017 at Unknown time  . fish oil-omega-3 fatty acids 1000 MG capsule Take 1 g by mouth 2 (two) times daily.    01/10/2017 at Unknown time  . furosemide (LASIX) 40 MG tablet Take 1 tablet by mouth daily, take 1 extra pill daily as needed for weight increase of 3 lbs in a day or 5 lbs in a week (Patient taking differently: Take 40 mg by mouth daily. ) 45 tablet 3 01/10/2017 at Unknown time  . gabapentin (NEURONTIN) 300 MG capsule Take 300 mg by mouth daily.    01/10/2017 at Unknown time  . Garlic 850 MG TABS Take 300 mg by mouth daily.   01/10/2017 at Unknown time  . ipratropium-albuterol (DUONEB) 0.5-2.5 (3) MG/3ML SOLN Inhale 3 mLs into the lungs 4 (four) times daily.    01/10/2017 at Unknown time  . LANTUS SOLOSTAR 100 UNIT/ML Solostar Pen Inject 15 Units into the skin every morning.    01/10/2017 at Unknown time  . linagliptin (TRADJENTA) 5 MG TABS tablet Take 5 mg by mouth daily.   01/10/2017 at Unknown time  . loratadine (CLARITIN) 10 MG tablet Take 10 mg by mouth daily.  3 01/10/2017 at Unknown time  . losartan (COZAAR) 50 MG tablet Take 50 mg by mouth daily.  2 01/10/2017 at Unknown time   . meclizine (ANTIVERT) 25 MG tablet Take 25 mg by mouth daily.    01/10/2017 at Unknown time  . metoprolol succinate (TOPROL-XL) 25 MG 24 hr tablet Take 1 tablet (25 mg total) by mouth daily. 90 tablet 3 01/10/2017 at 800a  . nitroGLYCERIN (NITROSTAT) 0.4 MG SL tablet Place 0.4 mg under the tongue every 5 (five) minutes as needed for chest pain.   unknown  . omeprazole (PRILOSEC) 20 MG capsule Take 1 capsule (20 mg total) by mouth daily. 60 capsule 3 01/10/2017 at Unknown time  . OXYGEN Inhale 3 L into the lungs continuous.   01/10/2017 at Unknown time  . potassium chloride (K-DUR) 10 MEQ tablet Take 1 tablet (10 mEq total) by mouth daily. 90 tablet 3 01/10/2017 at Unknown time  . pyridoxine (B-6) 100 MG tablet Take 100 mg by mouth daily. Reported on 05/28/2016   01/10/2017 at Unknown time  . rOPINIRole (REQUIP) 0.25 MG tablet Take 0.25 mg by mouth 3 (three) times daily.    01/10/2017 at Unknown time  . sodium chloride (OCEAN) 0.65 % SOLN nasal spray Place 1 spray into both nostrils as needed for congestion.   01/10/2017 at Unknown time  . traMADol (ULTRAM) 50 MG tablet Take 1 tablet by mouth every  4 (four) hours as needed for moderate pain.    unknown  . B-D ULTRAFINE III SHORT PEN 31G X 8 MM MISC See admin instructions.  3 Taking    Assesment:  Principal Problem:   Acute on chronic respiratory failure with hypoxia (HCC) Active Problems:   Adenocarcinoma of lung (HCC)   COPD exacerbation (HCC)   CKD (chronic kidney disease) stage 3, GFR 30-59 ml/min   Essential hypertension   Type II diabetes mellitus (HCC)   CHF (congestive heart failure) (HCC)   COPD with acute exacerbation ([place])    Plan:  Medications reviewed Will continue nebulizer treatment Continue iv steroid Cbc/bmp in am. Continue regular treatment    LOS: 2 days   Taiga Lupinacci 01/12/2017, 8:11 AM

## 2017-01-12 NOTE — Care Management Note (Signed)
Case Management Note  Patient Details  Name: Angela Lawson MRN: [id number] Date of Birth: 1934/11/22  Subjective/Objective:                  Pt admitted with COPD. She is from home. She is ind with ADL's. She has supplemental oxygen and neb machine PTA. She has PCP, transportation to appointments. She has insurance with drug coverage and no difficulty affording or managing medications.   Action/Plan: Pt plans to return home with self care. No CM needs anticipated.   Expected Discharge Date:    01/13/2017              Expected Discharge Plan:  Home/Self Care  In-House Referral:  NA  Discharge planning Services  CM Consult  Post Acute Care Choice:  NA Choice offered to:  NA  Status of Service:  Completed, signed off Sherald Barge, RN 01/12/2017, 3:07 PM

## 2017-01-12 NOTE — Consult Note (Signed)
Oncology is consulted for: New lesion on CT Scan.  This is a known finding to oncology who ordered the CT scan on 01/08/2017 and followed-up on the result with the patient on 2/1 or 2/2.  Order was placed for PET imaging on 01/08/2017 and this is scheduled for 01/21/2017 with oncology follow-up on 01/22/2017.    No role for oncology at this time.    Please call with any questions or concerns.  Patient is not seen.  No charge.  Pager: [phone]  Doy Mince 01/12/2017 7:43 PM

## 2017-01-13 LAB — CBC
HCT: 40 % (ref 36.0–46.0)
Hemoglobin: 13.1 g/dL (ref 12.0–15.0)
MCH: 30.4 pg (ref 26.0–34.0)
MCHC: 32.8 g/dL (ref 30.0–36.0)
MCV: 92.8 fL (ref 78.0–100.0)
Platelets: 225 10*3/uL (ref 150–400)
RBC: 4.31 MIL/uL (ref 3.87–5.11)
RDW: 14.8 % (ref 11.5–15.5)
WBC: 16 10*3/uL — ABNORMAL HIGH (ref 4.0–10.5)

## 2017-01-13 LAB — BASIC METABOLIC PANEL
Anion gap: 7 (ref 5–15)
BUN: 43 mg/dL — ABNORMAL HIGH (ref 6–20)
CO2: 28 mmol/L (ref 22–32)
Calcium: 9.7 mg/dL (ref 8.9–10.3)
Chloride: 99 mmol/L — ABNORMAL LOW (ref 101–111)
Creatinine, Ser: 1.71 mg/dL — ABNORMAL HIGH (ref 0.44–1.00)
GFR calc Af Amer: 31 mL/min — ABNORMAL LOW (ref >60)
GFR calc non Af Amer: 27 mL/min — ABNORMAL LOW (ref >60)
Glucose, Bld: 181 mg/dL — ABNORMAL HIGH (ref 65–99)
Potassium: 4.9 mmol/L (ref 3.5–5.1)
Sodium: 134 mmol/L — ABNORMAL LOW (ref 135–145)

## 2017-01-13 LAB — GLUCOSE, CAPILLARY
Glucose-Capillary: 141 mg/dL — ABNORMAL HIGH (ref 65–99)
Glucose-Capillary: 174 mg/dL — ABNORMAL HIGH (ref 65–99)

## 2017-01-13 MED ORDER — PREDNISONE 10 MG (21) PO TBPK: 20.0000 mg | ORAL_TABLET | Freq: Every day | ORAL | 0 refills | Status: AC

## 2017-01-13 NOTE — Care Management Important Message (Signed)
Important Message  Patient Details  Name: Angela Lawson MRN: [id number] Date of Birth: [birth year]   Medicare Important Message Given:  Yes    Sherald Barge, RN 01/13/2017, 8:59 AM

## 2017-01-13 NOTE — Discharge Summary (Signed)
Physician Discharge Summary  Patient ID: Angela Lawson MRN: [id number] DOB/AGE: 12-29-1933 83 y.o. Primary Care Physician:Timotheus Salm, MD Admit date: 01/10/2017 Discharge date: 01/13/2017    Discharge Diagnoses:   Principal Problem:   Acute on chronic respiratory failure with hypoxia [facility]) Active Problems:   Adenocarcinoma of lung (HCC)   COPD exacerbation (HCC)   CKD (chronic kidney disease) stage 3, GFR 30-59 ml/min   Essential hypertension   Type II diabetes mellitus (HCC)   CHF (congestive heart failure) (HCC)   COPD with acute exacerbation (HCC)   Allergies as of 01/13/2017      Reactions   Lisinopril Cough      Medication List    STOP taking these medications   benzonatate 100 MG capsule Commonly known as:  TESSALON     TAKE these medications   alendronate 70 MG tablet Commonly known as:  FOSAMAX Take 70 mg by mouth once a week. On Fridays   aspirin EC 81 MG tablet Take 81 mg by mouth daily.   atorvastatin 40 MG tablet Commonly known as:  LIPITOR Take 1 tablet (40 mg total) by mouth daily.   B-D ULTRAFINE III SHORT PEN 31G X 8 MM Misc Generic drug:  Insulin Pen Needle See admin instructions.   buPROPion 150 MG 12 hr tablet Commonly known as:  WELLBUTRIN SR Take 150 mg by mouth 2 (two) times daily.   fish oil-omega-3 fatty acids 1000 MG capsule Take 1 g by mouth 2 (two) times daily.   furosemide 40 MG tablet Commonly known as:  LASIX Take 1 tablet by mouth daily, take 1 extra pill daily as needed for weight increase of 3 lbs in a day or 5 lbs in a week What changed:  how much to take  how to take this  when to take this  additional instructions   gabapentin 300 MG capsule Commonly known as:  NEURONTIN Take 300 mg by mouth daily.   Garlic 630 MG Tabs Take 300 mg by mouth daily.   ipratropium-albuterol 0.5-2.5 (3) MG/3ML Soln Commonly known as:  DUONEB Inhale 3 mLs into the lungs 4 (four) times daily.   LANTUS SOLOSTAR 100 UNIT/ML  Solostar Pen Generic drug:  Insulin Glargine Inject 15 Units into the skin every morning.   linagliptin 5 MG Tabs tablet Commonly known as:  TRADJENTA Take 5 mg by mouth daily.   loratadine 10 MG tablet Commonly known as:  CLARITIN Take 10 mg by mouth daily.   losartan 50 MG tablet Commonly known as:  COZAAR Take 50 mg by mouth daily.   meclizine 25 MG tablet Commonly known as:  ANTIVERT Take 25 mg by mouth daily.   metoprolol succinate 25 MG 24 hr tablet Commonly known as:  TOPROL-XL Take 1 tablet (25 mg total) by mouth daily.   nitroGLYCERIN 0.4 MG SL tablet Commonly known as:  NITROSTAT Place 0.4 mg under the tongue every 5 (five) minutes as needed for chest pain.   omeprazole 20 MG capsule Commonly known as:  PRILOSEC Take 1 capsule (20 mg total) by mouth daily.   OXYGEN Inhale 3 L into the lungs continuous.   potassium chloride 10 MEQ tablet Commonly known as:  K-DUR Take 1 tablet (10 mEq total) by mouth daily.   predniSONE 10 MG (21) Tbpk tablet Commonly known as:  STERAPRED UNI-PAK 21 TAB Take 2 tablets (20 mg total) by mouth daily.   PROAIR HFA 108 (90 Base) MCG/ACT inhaler Generic drug:  albuterol Inhale 2 puffs  into the lungs every 4 (four) hours as needed for wheezing or shortness of breath.   pyridoxine 100 MG tablet Commonly known as:  B-6 Take 100 mg by mouth daily. Reported on 05/28/2016   rOPINIRole 0.25 MG tablet Commonly known as:  REQUIP Take 0.25 mg by mouth 3 (three) times daily.   sodium chloride 0.65 % Soln nasal spray Commonly known as:  OCEAN Place 1 spray into both nostrils as needed for congestion.   traMADol 50 MG tablet Commonly known as:  ULTRAM Take 1 tablet by mouth every 4 (four) hours as needed for moderate pain.   Vitamin D-3 1000 units Caps Take 1 capsule by mouth daily.       Discharged Condition: improved    Consults: none  Significant Diagnostic Studies: Dg Chest 2 View  Result Date: 01/10/2017 CLINICAL  DATA:  Cough and wheezing for 1 week EXAM: CHEST  2 VIEW COMPARISON:  01/07/2017 FINDINGS: Cardiac shadow is within normal limits. Aortic calcifications are seen. Mild interstitial changes are noted bilaterally. Bibasilar changes are noted similar to that seen on prior CT examination. The nodularity seen previously is not well appreciated on this exam. Left chest wall deformity is noted consistent with prior surgery. No sizable effusion is seen. Degenerative change of thoracic spine is noted. IMPRESSION: Bibasilar changes similar to recent CT examination. No new focal abnormality is seen. Electronically Signed   By: Inez Catalina M.D.   On: 01/10/2017 19:08   Ct Chest Wo Contrast  Result Date: 01/08/2017 CLINICAL DATA:  Surveillance for non-small cell lung cancer. EXAM: CT CHEST WITHOUT CONTRAST TECHNIQUE: Multidetector CT imaging of the chest was performed following the standard protocol without IV contrast. COMPARISON:  11/07/2015 FINDINGS: Cardiovascular: Coronary, aortic arch, and branch vessel atherosclerotic vascular disease. Mediastinum/Nodes: Small hiatal hernia. Mildly dilated and gas-filled esophagus. Stable hypodense lesion in the left thyroid lobe measuring approximately 1.3 by 0.9 cm on image 17/2. Lymph node anterior to the upper trachea on measures 10 mm in short axis on image 44/2, formerly 9 mm. Left internal mammary lymph node 7 mm in short axis on image 57/2, formerly the same. Right lower paratracheal lymph node adjacent to the carina 1 cm in short axis on image 61/2, formerly 1.2 cm. Lungs/Pleura: Severe emphysema. Biapical pleuroparenchymal scarring right greater than left, stable. Motion artifact reduces sensitivity for smaller lesions. Bullous lesion in the right lower lobe was slightly thick margins and with worsening nodular elements along at the edges including an new 1.0 by 0.7 cm nodular element on image 97/4 and more sharply defined nodular elements including a 0.6 by 0.8 cm nodule on  image 99 of series 4 and an adjacent 0.8 by 0.5 cm nodule on that same image. Other slightly nodular elements of this process are seen superiorly. This is progressive from prior and suspicious for malignancy. 0.7 by 0.5 cm subpleural nodule in the left upper lobe on image 43/4, by my measurements this is stable from 2016 and about stable from 2014, hence likely benign. There is confluent ground-glass opacity posteriorly in the remaining left lung. An ill-defined nodule on image 48/4 is stable but there is new nodularity along the upper margin of this ground-glass opacity including a poorly marginated 0.9 by 0.5 cm nodule on image 64/4. The ground-glass opacity encompasses the region of the staple line. Upper Abdomen: Stable mildly elevated left hemidiaphragm. Musculoskeletal: Some of the ribs are blurred by motion artifact. Postoperative deformities from some of the left ribs. Thoracic spondylosis. IMPRESSION:  1. New nodules along a thick walled bullous lesion in the right lower lobe, suspicious for malignancy. 2. New fairly large region of confluent ground-glass opacity posteriorly in the remaining left lung, low grade adenocarcinoma is not excluded and there is mild new nodularity along the upper margin of this process. 3. Stable biapical pleuroparenchymal scarring with several stable nodules in the left lung. 4. Borderline adenopathy in the mediastinum, essentially stable from prior. 5. Coronary, aortic arch, and branch vessel atherosclerotic vascular disease. 6. Small hiatal hernia. 7. Hypodense left thyroid lesion. If not previously worked up, thyroid ultrasound could be employed. Electronically Signed   By: Van Clines M.D.   On: 01/08/2017 08:15    Lab Results: Basic Metabolic Panel:  Recent Labs  01/11/17 0645 01/13/17 0555  NA 136 134*  K 5.1 4.9  CL 100* 99*  CO2 25 28  GLUCOSE 225* 181*  BUN 25* 43*  CREATININE 1.55* 1.71*  CALCIUM 9.7 9.7   Liver Function Tests: No results for  input(s): AST, ALT, ALKPHOS, BILITOT, PROT, ALBUMIN in the last 72 hours.   CBC:  Recent Labs  01/10/17 1840 01/11/17 0645 01/13/17 0555  WBC 8.1 10.2 16.0*  NEUTROABS 4.7  --   --   HGB 12.6 12.4 13.1  HCT 39.3 38.4 40.0  MCV 95.4 94.3 92.8  PLT 233 232 225    No results found for this or any previous visit (from the past 240 hour(s)).   Hospital Course:   This is an 83 years old female with history of multiple medical illnesses was admitted due to shortness of breath and wheezing secondary to COPD exacerbation. Patient was treated with iv steroid nebulizer treatment and nebulizer. Patient improved and she is back to her baseline. She will discharged on oral steroid and will be followed in the office/  Discharge Exam: Blood pressure 134/61, pulse 76, temperature 98.4 F (36.9 C), temperature source Oral, resp. rate 18, height '5\' 4"'$  (1.626 m), weight 82.4 kg (181 lb 10.5 oz), SpO2 98 %.     Disposition:  home      Signed: Madelline Eshbach   01/13/2017, 8:20 AM

## 2017-01-19 ENCOUNTER — Ambulatory Visit (HOSPITAL_COMMUNITY): Payer: Medicare Other

## 2017-01-20 ENCOUNTER — Ambulatory Visit (HOSPITAL_COMMUNITY): Payer: Medicare Other | Admitting: Adult Health

## 2017-01-21 ENCOUNTER — Encounter (HOSPITAL_COMMUNITY)
Admission: RE | Admit: 2017-01-21 | Discharge: 2017-01-21 | Disposition: A | Discharge status: Home / Self Care | Payer: Medicare Other | Source: Ambulatory Visit | Attending: Oncology | Admitting: Oncology

## 2017-01-21 DIAGNOSIS — C3492 Malignant neoplasm of unspecified part of left bronchus or lung: Secondary | ICD-10-CM | POA: Insufficient documentation

## 2017-01-21 LAB — GLUCOSE, CAPILLARY: Glucose-Capillary: 149 mg/dL — ABNORMAL HIGH (ref 65–99)

## 2017-01-21 MED ORDER — FLUDEOXYGLUCOSE F - 18 (FDG) INJECTION: 9.0000 | Freq: Once | INTRAVENOUS | Status: DC | PRN

## 2017-01-22 ENCOUNTER — Encounter (HOSPITAL_COMMUNITY): Payer: Self-pay | Admitting: Adult Health

## 2017-01-22 ENCOUNTER — Other Ambulatory Visit (HOSPITAL_COMMUNITY): Payer: Self-pay | Admitting: Specialist

## 2017-01-22 ENCOUNTER — Encounter (HOSPITAL_COMMUNITY): Payer: Medicare Other | Attending: Hematology & Oncology | Admitting: Adult Health

## 2017-01-22 VITALS — BP 127/38 | HR 78 | Temp 98.8°F | Resp 22 | Wt 176.3 lb

## 2017-01-22 DIAGNOSIS — R51 Headache: Secondary | ICD-10-CM | POA: Diagnosis not present

## 2017-01-22 DIAGNOSIS — R918 Other nonspecific abnormal finding of lung field: Secondary | ICD-10-CM | POA: Diagnosis not present

## 2017-01-22 DIAGNOSIS — Z85118 Personal history of other malignant neoplasm of bronchus and lung: Secondary | ICD-10-CM

## 2017-01-22 DIAGNOSIS — C3492 Malignant neoplasm of unspecified part of left bronchus or lung: Secondary | ICD-10-CM | POA: Insufficient documentation

## 2017-01-22 DIAGNOSIS — R1310 Dysphagia, unspecified: Secondary | ICD-10-CM

## 2017-01-22 DIAGNOSIS — R1319 Other dysphagia: Secondary | ICD-10-CM

## 2017-01-22 NOTE — Progress Notes (Signed)
[place], [place] [address]   CLINIC:  Medical Oncology/Hematology  PCP:  Rosita Fire, MD [place] [address] [phone]   REASON FOR VISIT:  Follow-up for suspicious lung lesions on CT and PET scan; h/o Stage IA NSCLC s/p resection in 2009.   CURRENT THERAPY: Initial work-up   BRIEF ONCOLOGIC HISTORY:  Oncology History   stage I A., non-small cell lung cancer, consistent with a poorly differentiated adenocarcinoma, status post resection 07/07/2008 in [place].       Adenocarcinoma of lung ([place])   07/07/2008 Surgery    Performed in [place], [place].  Consistent with poorly differentiated adenocarcinoma.      02/27/2014 Imaging    CT of chest demonstrating a RLL solid nodular component measuring 9 mm at the site of previous ground-glass opacity. Slight increase in size of an anterior mediastinal node measuring 1.1 cm with stable 1 cm pretracheal lymph node. Consider PET      03/20/2014 Imaging    PET scan- No hypermetabolic pulmonary lesions to suggest recurrent or metastatic pulmonary disease.  Weakly positive left internal mammary lymph node and left axillary lymph node (? inflammatory process involving the left breast).      11/07/2015 Imaging    CT chest- 1. No evidence of metastatic involvement of the lungs. The previously noted small noncalcified lung nodules are stable. 2. Vague focal opacity in the right lower lobe may represent scarring and is stable. Continued followup however is recommended in 1 year.      01/08/2017 Imaging    CT chest- 1. New nodules along a thick walled bullous lesion in the right lower lobe, suspicious for malignancy. 2. New fairly large region of confluent ground-glass opacity posteriorly in the remaining left lung, low grade adenocarcinoma is not excluded and there is mild new nodularity along the upper margin of this process. 3. Stable biapical pleuroparenchymal  scarring with several stable nodules in the left lung. 4. Borderline adenopathy in the mediastinum, essentially stable from prior. 5. Coronary, aortic arch, and branch vessel atherosclerotic vascular disease. 6. Small hiatal hernia. 7. Hypodense left thyroid lesion. If not previously worked up, thyroid ultrasound could be employed.        HISTORY OF PRESENT ILLNESS:  (From Kirby Crigler, PA-C's last note on 12/05/16)     INTERVAL HISTORY:  [name] presents today with her daughter for follow-up after completing recent CT and PET imaging.   On 01/07/17, she had CT chest for surveillance of her lung cancer; new nodules were noted and concerning for malignancy.  She was recently admitted on 01/10/17-01/13/17 for COPD exacerbation; she was discharged on oral steroids at that time.  On 01/21/17, she underwent PET scan revealing new hypermetabolic adenopathy in the mediastinum and hilum, with progressive hypermetabolic groundglass density in the left lower lobe; there is mention of possible concern of malignancy with a lymphangitic component in the left lower lobe, but this may also represent an atypical granulomatous infectious process as well; there were no findings of malignancy in the neck, abdomen/pelvis, or skeleton.  Overall, she reports feeling very weak and having decreased energy since she was hospitalized. Her daughter reports that she has been spending more time in bed, which is uncharacteristic of her mother.  Ms. Janusz has some worsening shortness of breath as well as dyspnea on exertion. Her O2 requirement has not increased; she remains on continuous O2 at 2-3 L. She continues to have productive cough with  light to green sputum for the past 1.5 weeks. She has completed her course of oral steroids, and is scheduled to follow-up with Dr. Legrand Rams next week on 01/29/17 per patient. She states she was not put on antibiotics at the time of her admission or discharge.  Her appetite has been good;  her daughter expresses concerns that she is getting choked when eating. They have noticed a worsening dysphasia-type symptoms within the past week. She was started on omeprazole relatively recently for GERD, which manifested as a hoarse voice. This has improved some of her symptoms, but the dysphasia remains.   Endorses "slight headache every once in awhile, but it's not too bad."  Denies hemoptysis, blood in her stools/melena, hematuria, dysuria, or vaginal bleeding. She tells me she did have to take 1 nitroglycerin tablet recently for chest pain, which alleviated her symptoms; only 1 tablet was taken and no other intervention necessary.   She is here today to review her PET scan results and discuss next steps.    REVIEW OF SYSTEMS:  Review of Systems  Constitutional: Positive for fatigue. Negative for chills and fever.  HENT:   Positive for trouble swallowing.   Eyes: Negative.   Respiratory: Positive for cough and shortness of breath. Negative for hemoptysis.   Cardiovascular: Positive for chest pain. Negative for leg swelling.  Gastrointestinal: Negative.  Negative for abdominal pain, blood in stool, constipation, diarrhea, nausea and vomiting.  Genitourinary: Negative.  Negative for dysuria, hematuria and vaginal bleeding.   Musculoskeletal: Negative.   Skin: Negative.  Negative for rash.  Neurological: Positive for headaches. Negative for dizziness.  Hematological: Negative.   Psychiatric/Behavioral: Negative.      PAST MEDICAL/SURGICAL HISTORY:  Past Medical History:  Diagnosis Date  . Adenocarcinoma of lung ([place])    Left lung 2009, resected  . Arthritis   . Back pain   . CHF (congestive heart failure) ([facility])   . COPD (chronic obstructive pulmonary disease) ([facility])   . Diverticulitis   . Dyslipidemia   . Essential hypertension   . H/O ventral hernia   . Non-obstructive CAD    a. 04/2015 NSTEMI/Cath: LAD 10p, LCX 32m RCA 368m20d, EF 35-40 w/ apical ballooning.  . On home O2     3L N/C  . Osteoporosis   . Osteoporosis 11/04/2015   Managed by Dr. FaLegrand Rams . Parkinson's disease ([facility]  . Shortness of breath   . Takotsubo cardiomyopathy    a. 04/2015 Echo: EF 45-50%, mid-dist anterior/apical/inferoapical HK w/ hyperdynamic base. Gr 1 DD, mild AI, mild-mod MR, triv TR, PASP 4852m;  b. 04/2015 LV gram: Ef 35-40% w/ apical ballooning.  . Type II diabetes mellitus ([facility] . Ventricular bigeminy    a. 04/2015 in setting of NSTEMI/Takotsubo.   Past Surgical History:  Procedure Laterality Date  . ABDOMINAL HYSTERECTOMY    . CARDIAC CATHETERIZATION N/A 04/17/2015   Procedure: Left Heart Cath and Coronary Angiography;  Surgeon: ThoTroy SineD;  Location: [facility];  Service: Cardiovascular;  Laterality: N/A;  . CHOLECYSTECTOMY    . COLONOSCOPY N/A 09/18/2014   Procedure: COLONOSCOPY;  Surgeon: SanDanie BinderD;  Location: [facility];  Service: Endoscopy;  Laterality: N/A;  8:30 AM - moved to 10:30 - [facility] notify pt  . ECTOPIC PREGNANCY SURGERY    . INCISIONAL HERNIA REPAIR N/A 08/26/2013   Procedure: HERNIA REPAIR INCISIONAL WITH MESH;  Surgeon: MarJamesetta SoD;  Location: [facility];  Service: General;  Laterality: N/A;  . LUNG CANCER SURGERY       SOCIAL HISTORY:  Social History   Social History  . Marital status: Widowed    Spouse name: N/A  . Number of children: N/A  . Years of education: N/A   Occupational History  . Not on file.   Social History Main Topics  . Smoking status: Former Smoker    Years: 20.00    Types: Cigarettes    Quit date: 08/13/2006  . Smokeless tobacco: Never Used  . Alcohol use No  . Drug use: No  . Sexual activity: Not Currently    Birth control/ protection: Surgical   Other Topics Concern  . Not on file   Social History Narrative  . No narrative on file    FAMILY HISTORY:  Family History  Problem Relation Age of Onset  . Diabetes Mother   . Hypertension Mother   . Diabetes Father   . Asthma      . Cancer    . Heart attack Sister     X2  . Colon cancer Neg Hx     CURRENT MEDICATIONS:  Outpatient Encounter Prescriptions as of 01/22/2017  Medication Sig  . albuterol (PROAIR HFA) 108 (90 BASE) MCG/ACT inhaler Inhale 2 puffs into the lungs every 4 (four) hours as needed for wheezing or shortness of breath.  Marland Kitchen alendronate (FOSAMAX) 70 MG tablet Take 70 mg by mouth once a week. On Fridays  . aspirin EC 81 MG tablet Take 81 mg by mouth daily.  . B-D ULTRAFINE III SHORT PEN 31G X 8 MM MISC See admin instructions.  . benzonatate (TESSALON) 100 MG capsule TAKE ONE CAPSULE 3 TIMES A DAY  . buPROPion (WELLBUTRIN SR) 150 MG 12 hr tablet Take 150 mg by mouth 2 (two) times daily.  . Cholecalciferol (VITAMIN D-3) 1000 UNITS CAPS Take 1 capsule by mouth daily.  . fish oil-omega-3 fatty acids 1000 MG capsule Take 1 g by mouth 2 (two) times daily.   . furosemide (LASIX) 40 MG tablet Take 1 tablet by mouth daily, take 1 extra pill daily as needed for weight increase of 3 lbs in a day or 5 lbs in a week (Patient taking differently: Take 40 mg by mouth daily. )  . gabapentin (NEURONTIN) 300 MG capsule Take 300 mg by mouth daily.   . Garlic 161 MG TABS Take 300 mg by mouth daily.  Marland Kitchen ipratropium-albuterol (DUONEB) 0.5-2.5 (3) MG/3ML SOLN Inhale 3 mLs into the lungs 4 (four) times daily.   Marland Kitchen LANTUS SOLOSTAR 100 UNIT/ML Solostar Pen Inject 15 Units into the skin every morning.   . linagliptin (TRADJENTA) 5 MG TABS tablet Take 5 mg by mouth daily.  Marland Kitchen loratadine (CLARITIN) 10 MG tablet Take 10 mg by mouth daily.  Marland Kitchen losartan (COZAAR) 50 MG tablet Take 50 mg by mouth daily.  . meclizine (ANTIVERT) 25 MG tablet Take 25 mg by mouth daily.   . metoprolol succinate (TOPROL-XL) 25 MG 24 hr tablet Take 1 tablet (25 mg total) by mouth daily.  . nitroGLYCERIN (NITROSTAT) 0.4 MG SL tablet Place 0.4 mg under the tongue every 5 (five) minutes as needed for chest pain.  Marland Kitchen omeprazole (PRILOSEC) 20 MG capsule Take 1  capsule (20 mg total) by mouth daily.  . OXYGEN Inhale 3 L into the lungs continuous.  . potassium chloride (K-DUR) 10 MEQ tablet Take 1 tablet (10 mEq total) by mouth daily.  Marland Kitchen pyridoxine (B-6) 100 MG  tablet Take 100 mg by mouth daily. Reported on 05/28/2016  . rOPINIRole (REQUIP) 0.25 MG tablet Take 0.25 mg by mouth 3 (three) times daily.   . sodium chloride (OCEAN) 0.65 % SOLN nasal spray Place 1 spray into both nostrils as needed for congestion.  . traMADol (ULTRAM) 50 MG tablet Take 1 tablet by mouth every 4 (four) hours as needed for moderate pain.   Marland Kitchen atorvastatin (LIPITOR) 40 MG tablet Take 1 tablet (40 mg total) by mouth daily.   Facility-Administered Encounter Medications as of 01/22/2017  Medication  . fludeoxyglucose F - 18 (FDG) injection 9 millicurie    ALLERGIES:  Allergies  Allergen Reactions  . Lisinopril Cough     PHYSICAL EXAM:  ECOG Performance status: 2 - Symptomatic, <50% in bed during day.   Vitals:   01/22/17 1356  BP: (!) 127/38  Pulse: 78  Resp: (!) 22  Temp: 98.8 F (37.1 C)   Filed Weights   01/22/17 1356  Weight: 176 lb 4.8 oz (80 kg)    Physical Exam  Constitutional: She is oriented to person, place, and time and well-developed, well-nourished, and in no distress.  HENT:  Head: Normocephalic.  Mouth/Throat: Oropharynx is clear and moist. No oropharyngeal exudate.  Eyes: Conjunctivae are normal. Pupils are equal, round, and reactive to light. No scleral icterus.  Neck: Normal range of motion. Neck supple.  Cardiovascular: Normal rate, regular rhythm and normal heart sounds.   Pulmonary/Chest: No respiratory distress. She has wheezes (Expiratory wheezes throughout). She has rhonchi (rhonchi to right upper lobe slightly improves with cough ) in the right upper field, the right lower field, the left upper field and the left lower field.  Abdominal: Soft. Bowel sounds are normal. There is no tenderness.  Musculoskeletal: Normal range of motion.  She exhibits no edema.  Lymphadenopathy:    She has no cervical adenopathy.  Neurological: She is alert and oriented to person, place, and time. No cranial nerve deficit.  Skin: Skin is warm and dry. No rash noted.  Psychiatric: Mood, memory, affect and judgment normal.     LABORATORY DATA:  I have reviewed the labs as listed.  CBC    Component Value Date/Time   WBC 16.0 (H) 01/13/2017 0555   RBC 4.31 01/13/2017 0555   HGB 13.1 01/13/2017 0555   HCT 40.0 01/13/2017 0555   PLT 225 01/13/2017 0555   MCV 92.8 01/13/2017 0555   MCH 30.4 01/13/2017 0555   MCHC 32.8 01/13/2017 0555   RDW 14.8 01/13/2017 0555   LYMPHSABS 1.9 01/10/2017 1840   MONOABS 1.0 01/10/2017 1840   EOSABS 0.4 01/10/2017 1840   BASOSABS 0.0 01/10/2017 1840   CMP Latest Ref Rng & Units 01/13/2017 01/11/2017 01/10/2017  Glucose 65 - 99 mg/dL 181(H) 225(H) 159(H)  BUN 6 - 20 mg/dL 43(H) 25(H) 20  Creatinine 0.44 - 1.00 mg/dL 1.71(H) 1.55(H) 1.51(H)  Sodium 135 - 145 mmol/L 134(L) 136 138  Potassium 3.5 - 5.1 mmol/L 4.9 5.1 4.6  Chloride 101 - 111 mmol/L 99(L) 100(L) 99(L)  CO2 22 - 32 mmol/L 28 25 33(H)  Calcium 8.9 - 10.3 mg/dL 9.7 9.7 9.7  Total Protein 6.5 - 8.1 g/dL - - -  Total Bilirubin 0.3 - 1.2 mg/dL - - -  Alkaline Phos 38 - 126 U/L - - -  AST 15 - 41 U/L - - -  ALT 14 - 54 U/L - - -    PENDING LABS:    DIAGNOSTIC IMAGING:  CT  chest: 01/07/17    PET scan: 01/21/17     PATHOLOGY:     ASSESSMENT & PLAN:   History of Stage IA NSCLC, s/p resection in 2009:  -New lung nodules concerning for malignancy noted on recent imaging.  Further work-up necessary.   New lung nodules:  -I discussed and reviewed the imaging reports with the patient and her daughter today; they were provided a copy as well.  -CT chest on 01/07/17 revealed new nodules along the thick-walled bullous lesion in the right lower lobe, suspicious for malignancy; new fairly large region of confluent groundglass opacity  posteriorly in the remaining left lung, low-grade adenocarcinoma is not excluded. PET scan on 01/21/17 revealed new hypermetabolic adenopathy in the mediastinum and hila, with progressive hypermetabolic groundglass of density posteriorly in the left lower lobe, as well as low-grade hypermetabolic activity in groundglass opacity dependently in the right upper lobe; areas concerning for malignancy, but atypical granulomatous infectious process also included on the differential. -I spoke with Dr. Luan Pulling, pulmonologist, who did not feel the lesions were amenable to bronchoscopy for biopsy.  I also discussed her case with IR physician with [facility]; they too felt the lesions would be too high-risk for interventional radiology intervention.  Both pulmonologist and IR recommend mediastinoscopy for evaluation and biopsy.  Patient's case was discussed with Dr. Modesto Charon, [facility] surgeon. He recommends that we repeat her CT scan in 6-8 weeks, given her recent COPD flare and the possibility of some of the CT/PET findings being related to an infectious/inflammatory process.  If lesions are not improved on subsequent scan, then refer to Dr. Roxan Hockey for biopsy at that time.  -CT chest without contrast (given marginal kidney function with creatinine 1.7) in 6 weeks.  -Return to cancer center after CT scan to review results and next steps.   Dysphagia:  -She does have history of GERD; currently on Prilosec.  Clinical concern for aspiration given patient's reported coughing with meals, both solids and liquids.  Recommended we get barium swallow to evaluate further. Orders placed today.      Dispo:  -Barium swallow to evaluate dysphagia.  -CT chest in 6 weeks -Return to cancer center in about 6 weeks to review CT and develop plan of care.    All questions were answered to patient's stated satisfaction. Encouraged patient to call with any new concerns or questions before her next visit to the cancer  center and we can certain see her sooner, if needed.     Plan of care discussed with Dr. Talbert Cage, who agrees with the above aforementioned.    Mike Craze, NP [place] [phone]

## 2017-01-22 NOTE — Patient Instructions (Addendum)
[place] at [facility] Discharge Instructions  RECOMMENDATIONS MADE BY THE CONSULTANT AND ANY TEST RESULTS WILL BE SENT TO YOUR REFERRING PHYSICIAN.  Exam with Mike Craze, NP. CT scan in about 6 weeks. Return to the clinic after that CT scan for results. We are also going to send you for a barium swallow.   Please see Amy as you leave for appointments.    Thank you for choosing [place] at [facility] to provide your oncology and hematology care.  To afford each patient quality time with our provider, please arrive at least 15 minutes before your scheduled appointment time.    If you have a lab appointment with the [facility] please come in thru the  Main Entrance and check in at the main information desk  You need to re-schedule your appointment should you arrive 10 or more minutes late.  We strive to give you quality time with our providers, and arriving late affects you and other patients whose appointments are after yours.  Also, if you no show three or more times for appointments you may be dismissed from the clinic at the providers discretion.     Again, thank you for choosing [facility].  Our hope is that these requests will decrease the amount of time that you wait before being seen by our physicians.       _____________________________________________________________  Should you have questions after your visit to [facility], please contact our office at [phone] between the hours of 8:30 a.m. and 4:30 p.m.  Voicemails left after 4:30 p.m. will not be returned until the following business day.  For prescription refill requests, have your pharmacy contact our office.       Resources For Cancer Patients and their Caregivers ? American Cancer Society: Can assist with transportation, wigs, general needs, runs Look Good Feel Better.        517-225-6599 ? Cancer Care: Provides financial  assistance, online support groups, medication/co-pay assistance.  1-800-813-HOPE 519-442-3694) ? [facility] Assists [place] cancer patients and their families through emotional , educational and financial support.  (513)443-9369 ? [place] DSS Where to apply for food stamps, Medicaid and utility assistance. 939-041-0652 ? [facility]: Transportation to medical appointments. 5597378171 ? Social Security Administration: May apply for disability if have a Stage IV cancer. 647-138-8912 919 208 9461 ? [employer], Disability and Transit Services: Assists with nutrition, care and transit needs. [place] Support Programs: '@10RELATIVEDAYS'$ @ > Cancer Support Group  2nd Tuesday of the month 1pm-2pm, Journey Room  > Creative Journey  3rd Tuesday of the month 1130am-1pm, Journey Room  > Look Good Feel Better  1st Wednesday of the month 10am-12 noon, Journey Room (Call [place] to register [phone])

## 2017-01-25 DIAGNOSIS — J449 Chronic obstructive pulmonary disease, unspecified: Secondary | ICD-10-CM | POA: Diagnosis not present

## 2017-01-27 ENCOUNTER — Ambulatory Visit (HOSPITAL_COMMUNITY): Payer: Medicare Other | Admitting: Speech Pathology

## 2017-01-29 ENCOUNTER — Other Ambulatory Visit (HOSPITAL_COMMUNITY): Payer: Self-pay | Admitting: Specialist

## 2017-01-29 DIAGNOSIS — I10 Essential (primary) hypertension: Secondary | ICD-10-CM | POA: Diagnosis not present

## 2017-01-29 DIAGNOSIS — E119 Type 2 diabetes mellitus without complications: Secondary | ICD-10-CM | POA: Diagnosis not present

## 2017-01-29 DIAGNOSIS — J449 Chronic obstructive pulmonary disease, unspecified: Secondary | ICD-10-CM | POA: Diagnosis not present

## 2017-01-29 DIAGNOSIS — R1319 Other dysphagia: Secondary | ICD-10-CM

## 2017-01-29 DIAGNOSIS — C3490 Malignant neoplasm of unspecified part of unspecified bronchus or lung: Secondary | ICD-10-CM | POA: Diagnosis not present

## 2017-02-02 ENCOUNTER — Encounter (HOSPITAL_COMMUNITY): Payer: Medicare Other | Admitting: Speech Pathology

## 2017-02-02 ENCOUNTER — Inpatient Hospital Stay (HOSPITAL_COMMUNITY): Admission: RE | Admit: 2017-02-02 | Payer: Medicare Other | Source: Ambulatory Visit

## 2017-02-04 DIAGNOSIS — I10 Essential (primary) hypertension: Secondary | ICD-10-CM | POA: Diagnosis not present

## 2017-02-05 ENCOUNTER — Telehealth (HOSPITAL_COMMUNITY): Payer: Self-pay | Admitting: Speech Pathology

## 2017-02-05 NOTE — Telephone Encounter (Signed)
02/05/17 talked to Corinne Ports Clegg/Acute Reh-she questioned about the order from the MD being in her workque. I went over our process as how we schedule a MBSS and Corinne Ports states we should be fine, she will remove the MD order from her workque. NF

## 2017-02-12 ENCOUNTER — Ambulatory Visit (HOSPITAL_COMMUNITY): Payer: Medicare Other | Attending: Internal Medicine | Admitting: Speech Pathology

## 2017-02-12 ENCOUNTER — Encounter (HOSPITAL_COMMUNITY): Payer: Self-pay | Admitting: Speech Pathology

## 2017-02-12 ENCOUNTER — Ambulatory Visit (HOSPITAL_COMMUNITY)
Admission: RE | Admit: 2017-02-12 | Discharge: 2017-02-12 | Disposition: A | Discharge status: Home / Self Care | Payer: Medicare Other | Source: Ambulatory Visit | Attending: Adult Health | Admitting: Adult Health

## 2017-02-12 DIAGNOSIS — R1319 Other dysphagia: Secondary | ICD-10-CM | POA: Diagnosis not present

## 2017-02-12 DIAGNOSIS — R1310 Dysphagia, unspecified: Secondary | ICD-10-CM | POA: Diagnosis not present

## 2017-02-12 NOTE — Therapy (Signed)
[place], [place], [address] Phone: [phone]   Fax:  [phone]  Modified Barium Swallow  Patient Details  Name: Angela Lawson MRN: [id number] Date of Birth: 1934-04-23 No Data Recorded  Encounter Date: 02/12/2017      End of Session - 02/12/17 2030    Visit Number 1   Number of Visits 1   Authorization Type [employer] Medicare   SLP Start Time 1300   SLP Stop Time  1342   SLP Time Calculation (min) 42 min   Activity Tolerance Patient tolerated treatment well      Past Medical History:  Diagnosis Date  . Adenocarcinoma of lung ([place])    Left lung 2009, resected  . Arthritis   . Back pain   . CHF (congestive heart failure) ([place])   . COPD (chronic obstructive pulmonary disease) ([place])   . Diverticulitis   . Dyslipidemia   . Essential hypertension   . H/O ventral hernia   . Non-obstructive CAD    a. 04/2015 NSTEMI/Cath: LAD 10p, LCX 11m RCA 330m20d, EF 35-40 w/ apical ballooning.  . On home O2    3L N/C  . Osteoporosis   . Osteoporosis 11/04/2015   Managed by Dr. FaLegrand Rams . Parkinson's disease ([facility]  . Shortness of breath   . Takotsubo cardiomyopathy    a. 04/2015 Echo: EF 45-50%, mid-dist anterior/apical/inferoapical HK w/ hyperdynamic base. Gr 1 DD, mild AI, mild-mod MR, triv TR, PASP 4867m;  b. 04/2015 LV gram: Ef 35-40% w/ apical ballooning.  . Type II diabetes mellitus ([facility] . Ventricular bigeminy    a. 04/2015 in setting of NSTEMI/Takotsubo.    Past Surgical History:  Procedure Laterality Date  . ABDOMINAL HYSTERECTOMY    . CARDIAC CATHETERIZATION N/A 04/17/2015   Procedure: Left Heart Cath and Coronary Angiography;  Surgeon: ThoTroy SineD;  Location: [facility];  Service: Cardiovascular;  Laterality: N/A;  . CHOLECYSTECTOMY    . COLONOSCOPY N/A 09/18/2014   Procedure: COLONOSCOPY;  Surgeon: SanDanie BinderD;  Location: [facility];  Service: Endoscopy;  Laterality: N/A;  8:30  AM - moved to 10:30 - CRosendo Gros notify pt  . ECTOPIC PREGNANCY SURGERY    . INCISIONAL HERNIA REPAIR N/A 08/26/2013   Procedure: HERNIA REPAIR INCISIONAL WITH MESH;  Surgeon: MarJamesetta SoD;  Location: [facility];  Service: General;  Laterality: N/A;  . LUNG CANCER SURGERY      There were no vitals filed for this visit.      Subjective Assessment - 02/12/17 2026    Subjective "Things get stuck here (pointed to sternum)."   Patient is accompained by: Family member  daughter   Special Tests MBSS   Currently in Pain? No/denies             General - 02/12/17 2027      General Information   Date of Onset 02/05/17   HPI Angela Lawson an [age] female who was referred by GreMike CrazeP for MBSS due to pt report of globus sensation with swallow.    Type of Study MBS-Modified Barium Swallow Study   Previous Swallow Assessment None on record   Diet Prior to this Study Regular;Thin liquids   Temperature Spikes Noted No   Respiratory Status Nasal cannula   History of Recent Intubation No   Behavior/Cognition Alert;Cooperative;Pleasant mood   Oral Cavity Assessment Within Functional Limits  Oral Care Completed by SLP No   Oral Cavity - Dentition Adequate natural dentition   Vision Functional for self feeding   Self-Feeding Abilities Able to feed self   Patient Positioning Upright in chair   Baseline Vocal Quality Normal;Hoarse   Volitional Cough Strong   Volitional Swallow Able to elicit   Anatomy Within functional limits   Pharyngeal Secretions Not observed secondary MBS            Oral Preparation/Oral Phase - 03/03/17 2028      Oral Preparation/Oral Phase   Oral Phase Within functional limits     Electrical stimulation - Oral Phase   Was Electrical Stimulation Used No          Pharyngeal Phase - 03-03-17 2028      Pharyngeal Phase   Pharyngeal Phase Within functional limits          Cricopharyngeal Phase - 03-03-2017 2028      Cervical  Esophageal Phase   Cervical Esophageal Phase Within functional limits           Plan - Mar 03, 2017 2030    Clinical Impression Statement Pt presents with essentially normal swallow for age. She was assessed with barium tinged thin (tsp/cup/straw), puree, regular textures, barium tablet with thin, and mixed consistencies (crackers and thin). Pt with mild premature spillage of thin liquids with swallow trigger at the level of the pyriforms and demonstrated flash penetration of thins during the swallow when taking sequential straw sips (underepiglottic coating only and did not reach vocal folds). Barium tablet remained near GE junction briefly. No further SLP follow up indicated at this time. The study with was reviewed with pt and daughter. They were given my contact information should they have further questions. Thank you for this referral.    Consulted and Agree with Plan of Care Patient      Patient will benefit from skilled therapeutic intervention in order to improve the following deficits and impairments:   Other dysphagia      G-Codes - 2017/03/03 2031    Functional Assessment Tool Used MBSS; clinical judgment   Functional Limitations Swallowing   Swallow Current Status (D2202) 0 percent impaired, limited or restricted   Swallow Goal Status (R4270) 0 percent impaired, limited or restricted   Swallow Discharge Status [phone]) 0 percent impaired, limited or restricted          Recommendations/Treatment - 03-03-17 2028      Swallow Evaluation Recommendations   SLP Diet Recommendations Age appropriate regular;Thin   Liquid Administration via Cup;Straw   Medication Administration Whole meds with liquid   Supervision Patient able to self feed   Postural Changes Seated upright at 90 degrees;Remain upright for at least 30 minutes after feeds/meals          Prognosis - 2017/03/03 2029      Prognosis   Prognosis for Safe Diet Advancement Good     Individuals Consulted   Consulted  and Agree with Results and Recommendations Patient;Family member/caregiver   Report Sent to  Referring physician      Problem List Patient Active Problem List   Diagnosis Date Noted  . COPD with acute exacerbation ([place]) 04/25/2016  . Acute on chronic respiratory failure with hypoxia ([place]) 04/25/2016  . SOB (shortness of breath)   . Osteoporosis 11/04/2015  . CHF (congestive heart failure) ([place]) 08/01/2015  . CHF exacerbation ([place]) 07/09/2015  . Essential hypertension 04/19/2015  . Type II diabetes mellitus ([place]) 04/19/2015  . Dyslipidemia 04/19/2015  .  History of lung cancer 04/19/2015  . Takotsubo cardiomyopathy 04/19/2015  . Ventricular bigeminy 04/19/2015  . NSTEMI (non-ST elevated myocardial infarction) ([place]) 04/19/2015  . Stress-induced cardiomyopathy 04/18/2015  . Elevated troponin 04/16/2015  . COPD exacerbation ([place]) 04/16/2015  . CKD (chronic kidney disease) stage 3, GFR 30-59 ml/min 04/16/2015  . Herpes genitalis in women 11/24/2014  . Toe fracture 09/28/2014  . Non-traumatic tear of right rotator cuff 11/30/2013  . Pain in joint, shoulder region 11/30/2013  . Muscle weakness (generalized) 11/30/2013  . Bursitis of left shoulder 02/09/2013  . Adenocarcinoma of lung [facility]) 08/20/2012   Thank you,  Genene Churn, [place]  [facility] 02/12/2017, 8:32 PM  [place] [address] [place], [place], [address] Phone: [phone]   Fax:  [phone]  Name: Angela Lawson MRN: [id number] Date of Birth: 29-May-1934

## 2017-02-20 DIAGNOSIS — L84 Corns and callosities: Secondary | ICD-10-CM | POA: Diagnosis not present

## 2017-02-20 DIAGNOSIS — L603 Nail dystrophy: Secondary | ICD-10-CM | POA: Diagnosis not present

## 2017-02-20 DIAGNOSIS — M792 Neuralgia and neuritis, unspecified: Secondary | ICD-10-CM | POA: Diagnosis not present

## 2017-02-20 DIAGNOSIS — I739 Peripheral vascular disease, unspecified: Secondary | ICD-10-CM | POA: Diagnosis not present

## 2017-02-22 DIAGNOSIS — J449 Chronic obstructive pulmonary disease, unspecified: Secondary | ICD-10-CM | POA: Diagnosis not present

## 2017-03-05 ENCOUNTER — Ambulatory Visit (HOSPITAL_COMMUNITY): Payer: Medicare Other

## 2017-03-09 ENCOUNTER — Encounter (HOSPITAL_COMMUNITY): Payer: Medicare Other | Attending: Hematology & Oncology | Admitting: Oncology

## 2017-03-09 ENCOUNTER — Encounter (HOSPITAL_COMMUNITY): Payer: Self-pay | Admitting: Oncology

## 2017-03-09 VITALS — BP 134/48 | HR 95 | Temp 98.3°F | Resp 20 | Ht 64.0 in | Wt 178.5 lb

## 2017-03-09 DIAGNOSIS — C3492 Malignant neoplasm of unspecified part of left bronchus or lung: Secondary | ICD-10-CM | POA: Insufficient documentation

## 2017-03-09 DIAGNOSIS — C3412 Malignant neoplasm of upper lobe, left bronchus or lung: Secondary | ICD-10-CM

## 2017-03-09 DIAGNOSIS — C3490 Malignant neoplasm of unspecified part of unspecified bronchus or lung: Secondary | ICD-10-CM

## 2017-03-09 NOTE — Assessment & Plan Note (Addendum)
Stage IA, non-small cell lung cancer, LUL, consistent with a poorly differentiated adenocarcinoma, status post resection 07/07/2008 in [place].  Now with abnormal imaging requiring work-up.  Labs today: CBC diff, CMET.  I personally reviewed and went over laboratory results with the patient.  The results are noted within this dictation.  She is S/P PET imaging in Feb 2018 that was concerning for recurrence of malignancy versus granulomatous disease.  Her case was discussed with multiple specialists as outlined below: I spoke with Dr. Luan Pulling, pulmonologist, who did not feel the lesions were amenable to bronchoscopy for biopsy.  I also discussed her case with IR physician with [facility]; they too felt the lesions would be too high-risk for interventional radiology intervention.  Both pulmonologist and IR recommend mediastinoscopy for evaluation and biopsy.  Patient's case was discussed with Dr. Modesto Charon, [place] surgeon. He recommends that we repeat her CT scan in 6-8 weeks, given her recent COPD flare and the possibility of some of the CT/PET findings being related to an infectious/inflammatory process.  If lesions are not improved on subsequent scan, then refer to Dr. Roxan Hockey for biopsy at that time.   She was scheduled for repeat CT imaging as outlined above on 03/05/2017 but she was a no-show.  I personally reviewed and went over radiographic studies with the patient.  The results are noted within this dictation.  Barium swallow study was reviewed and is normal.  Order is placed for repeat CT imaging wo contrast (due to renal function).    Return following CT imaging for medical oncology recommendations.

## 2017-03-09 NOTE — Patient Instructions (Addendum)
[place] at [facility] Discharge Instructions  RECOMMENDATIONS MADE BY THE CONSULTANT AND ANY TEST RESULTS WILL BE SENT TO YOUR REFERRING PHYSICIAN.  You were seen today by Kirby Crigler PA-C. CT chest in 2 weeks. Return in 3 weeks for follow up.    Thank you for choosing [place] at [facility] to provide your oncology and hematology care.  To afford each patient quality time with our provider, please arrive at least 15 minutes before your scheduled appointment time.    If you have a lab appointment with the [facility] please come in thru the  Main Entrance and check in at the main information desk  You need to re-schedule your appointment should you arrive 10 or more minutes late.  We strive to give you quality time with our providers, and arriving late affects you and other patients whose appointments are after yours.  Also, if you no show three or more times for appointments you may be dismissed from the clinic at the providers discretion.     Again, thank you for choosing [facility].  Our hope is that these requests will decrease the amount of time that you wait before being seen by our physicians.       _____________________________________________________________  Should you have questions after your visit to [facility], please contact our office at [phone] between the hours of 8:30 a.m. and 4:30 p.m.  Voicemails left after 4:30 p.m. will not be returned until the following business day.  For prescription refill requests, have your pharmacy contact our office.       Resources For Cancer Patients and their Caregivers ? American Cancer Society: Can assist with transportation, wigs, general needs, runs Look Good Feel Better.        4805360807 ? Cancer Care: Provides financial assistance, online support groups, medication/co-pay assistance.  1-800-813-HOPE (551)792-3948) ? [place] Assists [place] cancer patients and their families through emotional , educational and financial support.  (475)809-0117 ? [place] DSS Where to apply for food stamps, Medicaid and utility assistance. 979-096-4161 ? [facility]: Transportation to medical appointments. 878 426 4626 ? Social Security Administration: May apply for disability if have a Stage IV cancer. 947-878-9239 506 535 5158 ? [employer], Disability and Transit Services: Assists with nutrition, care and transit needs. [place] Support Programs: '@10RELATIVEDAYS'$ @ > Cancer Support Group  2nd Tuesday of the month 1pm-2pm, Journey Room  > Creative Journey  3rd Tuesday of the month 1130am-1pm, Journey Room  > Look Good Feel Better  1st Wednesday of the month 10am-12 noon, Journey Room (Call [place] to register [phone])

## 2017-03-09 NOTE — Progress Notes (Signed)
[facility], MD [address] [place] [address]  Adenocarcinoma of lung, unspecified laterality [facility]) - Plan: CT Chest Wo Contrast  CURRENT THERAPY: Work-up for abnormal imaging.  INTERVAL HISTORY: Angela Lawson 83 y.o. female returns for followup of stage I A., non-small cell lung cancer, consistent with a poorly differentiated adenocarcinoma, status post resection 07/07/2008 in [place].   Now with abnormal imaging requiring work-up.  Oncology History   stage I A., non-small cell lung cancer, consistent with a poorly differentiated adenocarcinoma, status post resection 07/07/2008 in [place].       Adenocarcinoma of lung ([place])   07/07/2008 Surgery    Performed in [place], [place].  Consistent with poorly differentiated adenocarcinoma.      02/27/2014 Imaging    CT of chest demonstrating a RLL solid nodular component measuring 9 mm at the site of previous ground-glass opacity. Slight increase in size of an anterior mediastinal node measuring 1.1 cm with stable 1 cm pretracheal lymph node. Consider PET      03/20/2014 Imaging    PET scan- No hypermetabolic pulmonary lesions to suggest recurrent or metastatic pulmonary disease.  Weakly positive left internal mammary lymph node and left axillary lymph node (? inflammatory process involving the left breast).      11/07/2015 Imaging    CT chest- 1. No evidence of metastatic involvement of the lungs. The previously noted small noncalcified lung nodules are stable. 2. Vague focal opacity in the right lower lobe may represent scarring and is stable. Continued followup however is recommended in 1 year.      01/08/2017 Imaging    CT chest- 1. New nodules along a thick walled bullous lesion in the right lower lobe, suspicious for malignancy. 2. New fairly large region of confluent ground-glass opacity posteriorly in the remaining left lung, low grade adenocarcinoma is not excluded and there is  mild new nodularity along the upper margin of this process. 3. Stable biapical pleuroparenchymal scarring with several stable nodules in the left lung. 4. Borderline adenopathy in the mediastinum, essentially stable from prior. 5. Coronary, aortic arch, and branch vessel atherosclerotic vascular disease. 6. Small hiatal hernia. 7. Hypodense left thyroid lesion. If not previously worked up, thyroid ultrasound could be employed.      01/21/2017 PET scan    1. New hypermetabolic adenopathy in the mediastinum and hila, with progressive hypermetabolic ground-glass density posteriorly in the left lower lobe with faint nodular components, and some low-grade hypermetabolic activity in ground-glass opacity dependently in the right upper lobe. Although the appearance is concerning for malignancy potentially with a lymphangitic component in the left lower lobe, the unusual configuration in the appearance of the ground-glass opacities could conceivably represent an atypical granulomatous infectious process as an alternative. This is not classic obvious recurrence with well-defined mass or nodules. 2. There is some ill-defined clustered nodularity in the right lower lobe only faintly metabolic, maximum SUV 3.0. 3. Overall I believe that this process require surveillance to differentiate an inflammatory/infectious condition from recurrent malignancy. The adenopathy is certainly concerning. 4. No findings of malignancy in the neck, abdomen/pelvis, or skeleton. 5. Sigmoid colon diverticulosis. 6. Emphysema. 7. Chronic ethmoid sinusitis.      She is doing well.  She is in a wheelchair today which is baseline for her.  She has oxygen being delivered via nasal cannula.  She notes that her breathing is stable.  She denies any recent COPD exacerbations.  She denies any significant shortness  of breath.    She denies any change in her appetite.  Her weight is stable.  She denies any  hemoptysis.  Review of Systems  Constitutional: Negative.  Negative for chills, fever and weight loss.  HENT: Negative.   Eyes: Negative.   Respiratory: Negative.  Negative for cough, hemoptysis, sputum production and shortness of breath.   Cardiovascular: Negative.  Negative for chest pain.  Gastrointestinal: Negative.  Negative for blood in stool, constipation, diarrhea, melena, nausea and vomiting.  Genitourinary: Negative.   Musculoskeletal: Negative.   Skin: Negative.   Neurological: Negative.  Negative for weakness.  Endo/Heme/Allergies: Negative.   Psychiatric/Behavioral: Negative.     Past Medical History:  Diagnosis Date  . Adenocarcinoma of lung ([place])    Left lung 2009, resected  . Arthritis   . Back pain   . CHF (congestive heart failure) ([place])   . COPD (chronic obstructive pulmonary disease) ([place])   . Diverticulitis   . Dyslipidemia   . Essential hypertension   . H/O ventral hernia   . Non-obstructive CAD    a. 04/2015 NSTEMI/Cath: LAD 10p, LCX 70m RCA 366m20d, EF 35-40 w/ apical ballooning.  . On home O2    3L N/C  . Osteoporosis   . Osteoporosis 11/04/2015   Managed by Dr. FaLegrand Rams . Parkinson's disease ([place]  . Shortness of breath   . Takotsubo cardiomyopathy    a. 04/2015 Echo: EF 45-50%, mid-dist anterior/apical/inferoapical HK w/ hyperdynamic base. Gr 1 DD, mild AI, mild-mod MR, triv TR, PASP 4875m;  b. 04/2015 LV gram: Ef 35-40% w/ apical ballooning.  . Type II diabetes mellitus ([place] . Ventricular bigeminy    a. 04/2015 in setting of NSTEMI/Takotsubo.    Past Surgical History:  Procedure Laterality Date  . ABDOMINAL HYSTERECTOMY    . CARDIAC CATHETERIZATION N/A 04/17/2015   Procedure: Left Heart Cath and Coronary Angiography;  Surgeon: ThoTroy SineD;  Location: [facility] [facility];  Service: Cardiovascular;  Laterality: N/A;  . CHOLECYSTECTOMY    . COLONOSCOPY N/A 09/18/2014   Procedure: COLONOSCOPY;  Surgeon: SanDanie BinderD;  Location[facility]  [facility] SU[facility];  Service: Endoscopy;  Laterality: N/A;  8:30 AM - moved to 10:30 - CRosendo Gros notify pt  . ECTOPIC PREGNANCY SURGERY    . INCISIONAL HERNIA REPAIR N/A 08/26/2013   Procedure: HERNIA REPAIR INCISIONAL WITH MESH;  Surgeon: MarJamesetta SoD;  Location: [facility] ORS;  Service: General;  Laterality: N/A;  . LUNG CANCER SURGERY      Family History  Problem Relation Age of Onset  . Diabetes Mother   . Hypertension Mother   . Diabetes Father   . Asthma    . Cancer    . Heart attack Sister     X2  . Colon cancer Neg Hx     Social History   Social History  . Marital status: Widowed    Spouse name: N/A  . Number of children: N/A  . Years of education: N/A   Social History Main Topics  . Smoking status: Former Smoker    Years: 20.00    Types: Cigarettes    Quit date: 08/13/2006  . Smokeless tobacco: Never Used  . Alcohol use No  . Drug use: No  . Sexual activity: Not Currently    Birth control/ protection: Surgical   Other Topics Concern  . None   Social History Narrative  . None     PHYSICAL EXAMINATION  ECOG PERFORMANCE STATUS: 2 - Symptomatic, <50% confined to bed  Vitals:   03/09/17 1042  BP: (!) 134/48  Pulse: 95  Resp: 20  Temp: 98.3 F (36.8 C)    GENERAL:alert, no distress, well nourished, well developed, comfortable, cooperative, smiling and accompanied by daughter. SKIN: skin color, texture, turgor are normal, no rashes or significant lesions HEAD: Normocephalic, No masses, lesions, tenderness or abnormalities EYES: normal, EOMI, Conjunctiva are pink and non-injected EARS: External ears normal OROPHARYNX:lips, buccal mucosa, and tongue normal and mucous membranes are moist  NECK: supple, trachea midline LYMPH:  no palpable lymphadenopathy BREAST:not examined LUNGS: clear to auscultation , decreased breath sounds bilaterally HEART: regular rate & rhythm ABDOMEN:abdomen soft and normal bowel sounds BACK: Back symmetric, no  curvature. EXTREMITIES:less then 2 second capillary refill, no joint deformities, effusion, or inflammation, no edema, no skin discoloration, no cyanosis  NEURO: alert & oriented x 3 with fluent speech, no focal motor/sensory deficits, in wheelchair   LABORATORY DATA: CBC    Component Value Date/Time   WBC 16.0 (H) 01/13/2017 0555   RBC 4.31 01/13/2017 0555   HGB 13.1 01/13/2017 0555   HCT 40.0 01/13/2017 0555   PLT 225 01/13/2017 0555   MCV 92.8 01/13/2017 0555   MCH 30.4 01/13/2017 0555   MCHC 32.8 01/13/2017 0555   RDW 14.8 01/13/2017 0555   LYMPHSABS 1.9 01/10/2017 1840   MONOABS 1.0 01/10/2017 1840   EOSABS 0.4 01/10/2017 1840   BASOSABS 0.0 01/10/2017 1840      Chemistry      Component Value Date/Time   NA 134 (L) 01/13/2017 0555   K 4.9 01/13/2017 0555   CL 99 (L) 01/13/2017 0555   CO2 28 01/13/2017 0555   BUN 43 (H) 01/13/2017 0555   CREATININE 1.71 (H) 01/13/2017 0555   CREATININE 1.51 (H) 10/01/2016 1436      Component Value Date/Time   CALCIUM 9.7 01/13/2017 0555   CALCIUM 10.0 08/20/2012 1408   ALKPHOS 53 11/06/2016 1115   AST 16 11/06/2016 1115   ALT 16 11/06/2016 1115   BILITOT 0.4 11/06/2016 1115        PENDING LABS:   RADIOGRAPHIC STUDIES:  Dg Op Swallowing Func-medicare/speech Path  Result Date: 02/17/2017 [place], [place], [address] Phone: [phone]   Fax:  [phone] Modified Barium Swallow Patient Details Name: MORGAINE KIMBALL MRN: [id number] Date of Birth: 22-Sep-1934 No Data Recorded Encounter Date: 02/12/2017   End of Session - 02/12/17 2030   Visit Number 1  Number of Visits 1  Authorization Type [employer] Medicare  SLP Start Time 1300  SLP Stop Time  1342  SLP Time Calculation (min) 42 min  Activity Tolerance Patient tolerated treatment well  Past Medical History: Diagnosis Date . Adenocarcinoma of lung ([place])   Left lung 2009, resected . Arthritis  . Back pain  . CHF (congestive  heart failure) ([place])  . COPD (chronic obstructive pulmonary disease) ([place])  . Diverticulitis  . Dyslipidemia  . Essential hypertension  . H/O ventral hernia  . Non-obstructive CAD   a. 04/2015 NSTEMI/Cath: LAD 10p, LCX 2m RCA 346m20d, EF 35-40 w/ apical ballooning. . On home O2   3L N/C . Osteoporosis  . Osteoporosis 11/04/2015  Managed by Dr. FaLegrand Rams. Parkinson's disease ([place] . Shortness of breath  . Takotsubo cardiomyopathy   a. 04/2015 Echo: EF 45-50%, mid-dist anterior/apical/inferoapical HK w/ hyperdynamic base. Gr 1 DD, mild AI, mild-mod  MR, triv TR, PASP 84mHg;  b. 04/2015 LV gram: Ef 35-40% w/ apical ballooning. . Type II diabetes mellitus ([place]  . Ventricular bigeminy   a. 04/2015 in setting of NSTEMI/Takotsubo. Past Surgical History: Procedure Laterality Date . ABDOMINAL HYSTERECTOMY   . CARDIAC CATHETERIZATION N/A 04/17/2015  Procedure: Left Heart Cath and Coronary Angiography;  Surgeon: TTroy Sine MD;  [facility];  Service: Cardiovascular;  Laterality: N/A; . CHOLECYSTECTOMY   . COLONOSCOPY N/A 09/18/2014  Procedure: COLONOSCOPY;  Surgeon: SDanie Binder MD;  [facility];  Service: Endoscopy;  Laterality: N/A;  8:30 AM - moved to 10:30 -Rosendo Grosto notify pt . ECTOPIC PREGNANCY SURGERY   . INCISIONAL HERNIA REPAIR N/A 08/26/2013  Procedure: HERNIA REPAIR INCISIONAL WITH MESH;  Surgeon: MJamesetta So MD;  Location: [facility] ORS;  Service: General;  Laterality: N/A; . LUNG CANCER SURGERY   There were no vitals filed for this visit.   Subjective Assessment - 02018-03-26203-30-2026  Subjective "Things get stuck here (pointed to sternum)."  Patient is accompained by: Family member daughter  Special Tests MBSS  Currently in Pain? No/denies    General - [id number] 30, 2027   General Information  Date of Onset 02/05/17  HPI Evangeline LBinetteis an 83yo female who was referred by GMike Craze NP for MBSS due to pt report of globus sensation with swallow.   Type of Study MBS-Modified Barium  Swallow Study  Previous Swallow Assessment None on record  Diet Prior to this Study Regular;Thin liquids  Temperature Spikes Noted No  Respiratory Status Nasal cannula  History of Recent Intubation No  Behavior/Cognition Alert;Cooperative;Pleasant mood  Oral Cavity Assessment Within Functional Limits  Oral Care Completed by SLP No  Oral Cavity - Dentition Adequate natural dentition  Vision Functional for self feeding  Self-Feeding Abilities Able to feed self  Patient Positioning Upright in chair  Baseline Vocal Quality Normal;Hoarse  Volitional Cough Strong  Volitional Swallow Able to elicit  Anatomy Within functional limits  Pharyngeal Secretions Not observed secondary MBS    Oral Preparation/Oral Phase - 0Mar 26, 20182March 30, 2028   Oral Preparation/Oral Phase  Oral Phase Within functional limits   Electrical stimulation - Oral Phase  Was Electrical Stimulation Used No    Pharyngeal Phase - 0Mar 26, 2018203-30-2028   Pharyngeal Phase  Pharyngeal Phase Within functional limits    Cricopharyngeal Phase - 02018/03/26203/30/28   Cervical Esophageal Phase  Cervical Esophageal Phase Within functional limits    Plan - 003/26/182030   Clinical Impression Statement Pt presents with essentially normal swallow for age. She was assessed with barium tinged thin (tsp/cup/straw), puree, regular textures, barium tablet with thin, and mixed consistencies (crackers and thin). Pt with mild premature spillage of thin liquids with swallow trigger at the level of the pyriforms and demonstrated flash penetration of thins during the swallow when taking sequential straw sips (underepiglottic coating only and did not reach vocal folds). Barium tablet remained near GE junction briefly. No further SLP follow up indicated at this time. The study with was reviewed with pt and daughter. They were given my contact information should they have further questions. Thank you for this referral.   Consulted and Agree with Plan of Care Patient  Patient will benefit from skilled  therapeutic intervention in order to improve the following deficits and impairments:  Other dysphagia   G-Codes - 02018/03/26203-30-31  Functional Assessment Tool Used MBSS; clinical judgment  Functional Limitations  Swallowing  Swallow Current Status [id number]) 0 percent impaired, limited or restricted  Swallow Goal Status ([id number]) 0 percent impaired, limited or restricted  Swallow Discharge Status [phone]) 0 percent impaired, limited or restricted    Recommendations/Treatment - 02/12/17 2028    Swallow Evaluation Recommendations  SLP Diet Recommendations Age appropriate regular;Thin  Liquid Administration via Cup;Straw  Medication Administration Whole meds with liquid  Supervision Patient able to self feed  Postural Changes Seated upright at 90 degrees;Remain upright for at least 30 minutes after feeds/meals    Prognosis - 02/12/17 2029    Prognosis  Prognosis for Safe Diet Advancement Good   Individuals Consulted  Consulted and Agree with Results and Recommendations Patient;Family member/caregiver  Report Sent to  Referring physician  Problem List Patient Active Problem List  Diagnosis Date Noted . COPD with acute exacerbation ([place]) 04/25/2016 . Acute on chronic respiratory failure with hypoxia ([place]) 04/25/2016 . SOB (shortness of breath)  . Osteoporosis 11/04/2015 . CHF (congestive heart failure) ([place]) 08/01/2015 . CHF exacerbation ([place]) 07/09/2015 . Essential hypertension 04/19/2015 . Type II diabetes mellitus ([place]) 04/19/2015 . Dyslipidemia 04/19/2015 . History of lung cancer 04/19/2015 . Takotsubo cardiomyopathy 04/19/2015 . Ventricular bigeminy 04/19/2015 . NSTEMI (non-ST elevated myocardial infarction) ([place]) 04/19/2015 . Stress-induced cardiomyopathy 04/18/2015 . Elevated troponin 04/16/2015 . COPD exacerbation ([place]) 04/16/2015 . CKD (chronic kidney disease) stage 3, GFR 30-59 ml/min 04/16/2015 . Herpes genitalis in women 11/24/2014 . Toe fracture 09/28/2014 . Non-traumatic tear of right rotator cuff 11/30/2013 . Pain  in joint, shoulder region 11/30/2013 . Muscle weakness (generalized) 11/30/2013 . Bursitis of left shoulder 02/09/2013 . Adenocarcinoma of lung [facility]) 08/20/2012 Thank you, Genene Churn, [place] [facility] 02/12/2017, 8:32 PM [place] [address] [place], [place], [address] Phone: [phone]   Fax:  [phone] Name: MARUA QIN MRN: [id number] Date of Birth: Feb 01, 1934 CLINICAL DATA:  Dysphagia. EXAM: MODIFIED BARIUM SWALLOW TECHNIQUE: Different consistencies of barium were administered orally to the patient by the Speech Pathologist. Imaging of the pharynx was performed in the lateral projection. FLUOROSCOPY TIME:  Radiation Exposure Index (if provided by the fluoroscopic device): 9.7 mGy. COMPARISON:  None. FINDINGS: Thin liquid- premature spillage was noted. Flash penetration was noted. Nectar thick liquid- not administered. Honey- not administered. Pure- within normal limits Cracker-not administered. Pure with cracker- within normal limits Barium tablet -  was delayed at gastroesophageal junction. IMPRESSION: Premature spillage and flash penetration was noted with thin liquid consistency. No other abnormality was noted. Please refer to the Speech Pathologists report for complete details and recommendations. Electronically Signed   By: Marijo Conception, M.D.   On: 02/12/2017 14:38     PATHOLOGY:    ASSESSMENT AND PLAN:  Adenocarcinoma of lung (HCC) Stage IA, non-small cell lung cancer, LUL, consistent with a poorly differentiated adenocarcinoma, status post resection 07/07/2008 in [place].  Now with abnormal imaging requiring work-up.  Labs today: CBC diff, CMET.  I personally reviewed and went over laboratory results with the patient.  The results are noted within this dictation.  She is S/P PET imaging in Feb 2018 that was concerning for recurrence of malignancy versus granulomatous disease.  Her case was discussed with  multiple specialists as outlined below: I spoke with Dr. Luan Pulling, pulmonologist, who did not feel the lesions were amenable to bronchoscopy for biopsy.  I also discussed her case with IR physician with [facility]; they too felt the lesions would be too high-risk for interventional radiology intervention.  Both  pulmonologist and IR recommend mediastinoscopy for evaluation and biopsy.  Patient's case was discussed with Dr. Modesto Charon, [place] surgeon. He recommends that we repeat her CT scan in 6-8 weeks, given her recent COPD flare and the possibility of some of the CT/PET findings being related to an infectious/inflammatory process.  If lesions are not improved on subsequent scan, then refer to Dr. Roxan Hockey for biopsy at that time.   She was scheduled for repeat CT imaging as outlined above on 03/05/2017 but she was a no-show.  I personally reviewed and went over radiographic studies with the patient.  The results are noted within this dictation.  Barium swallow study was reviewed and is normal.  Order is placed for repeat CT imaging wo contrast (due to renal function).    Return following CT imaging for medical oncology recommendations.   ORDERS PLACED FOR THIS ENCOUNTER: Orders Placed This Encounter  Procedures  . CT Chest Wo Contrast    MEDICATIONS PRESCRIBED THIS ENCOUNTER: No orders of the defined types were placed in this encounter.   THERAPY PLAN:  Re-imaging of chest to guide medical oncology recommendations moving forward.  All questions were answered. The patient knows to call the clinic with any problems, questions or concerns. We can certainly see the patient much sooner if necessary.  Patient and plan discussed with Dr. Twana First and she is in agreement with the aforementioned.   This note is electronically signed by: Doy Mince 03/09/2017 11:12 AM

## 2017-03-11 DIAGNOSIS — L600 Ingrowing nail: Secondary | ICD-10-CM | POA: Diagnosis not present

## 2017-03-22 ENCOUNTER — Emergency Department (HOSPITAL_COMMUNITY)
Admission: EM | Admit: 2017-03-22 | Discharge: 2017-03-22 | Disposition: A | Discharge status: Home / Self Care | Payer: Medicare Other | Attending: Emergency Medicine | Admitting: Emergency Medicine

## 2017-03-22 ENCOUNTER — Encounter (HOSPITAL_COMMUNITY): Payer: Self-pay | Admitting: Emergency Medicine

## 2017-03-22 DIAGNOSIS — T814XXA Infection following a procedure, initial encounter: Secondary | ICD-10-CM | POA: Diagnosis not present

## 2017-03-22 DIAGNOSIS — Z85118 Personal history of other malignant neoplasm of bronchus and lung: Secondary | ICD-10-CM | POA: Diagnosis not present

## 2017-03-22 DIAGNOSIS — I509 Heart failure, unspecified: Secondary | ICD-10-CM | POA: Diagnosis not present

## 2017-03-22 DIAGNOSIS — N183 Chronic kidney disease, stage 3 (moderate): Secondary | ICD-10-CM | POA: Diagnosis not present

## 2017-03-22 DIAGNOSIS — Z7982 Long term (current) use of aspirin: Secondary | ICD-10-CM | POA: Diagnosis not present

## 2017-03-22 DIAGNOSIS — E1122 Type 2 diabetes mellitus with diabetic chronic kidney disease: Secondary | ICD-10-CM | POA: Diagnosis not present

## 2017-03-22 DIAGNOSIS — Z79899 Other long term (current) drug therapy: Secondary | ICD-10-CM | POA: Diagnosis not present

## 2017-03-22 DIAGNOSIS — Z4801 Encounter for change or removal of surgical wound dressing: Secondary | ICD-10-CM | POA: Diagnosis present

## 2017-03-22 DIAGNOSIS — G20 Parkinson's disease: Secondary | ICD-10-CM | POA: Diagnosis not present

## 2017-03-22 DIAGNOSIS — J449 Chronic obstructive pulmonary disease, unspecified: Secondary | ICD-10-CM | POA: Insufficient documentation

## 2017-03-22 DIAGNOSIS — I130 Hypertensive heart and chronic kidney disease with heart failure and stage 1 through stage 4 chronic kidney disease, or unspecified chronic kidney disease: Secondary | ICD-10-CM | POA: Insufficient documentation

## 2017-03-22 DIAGNOSIS — T148XXA Other injury of unspecified body region, initial encounter: Secondary | ICD-10-CM

## 2017-03-22 DIAGNOSIS — L089 Local infection of the skin and subcutaneous tissue, unspecified: Secondary | ICD-10-CM

## 2017-03-22 DIAGNOSIS — Y828 Other medical devices associated with adverse incidents: Secondary | ICD-10-CM | POA: Insufficient documentation

## 2017-03-22 DIAGNOSIS — Z87891 Personal history of nicotine dependence: Secondary | ICD-10-CM | POA: Insufficient documentation

## 2017-03-22 LAB — BASIC METABOLIC PANEL
Anion gap: 9 (ref 5–15)
BUN: 33 mg/dL — ABNORMAL HIGH (ref 6–20)
CO2: 31 mmol/L (ref 22–32)
Calcium: 9.8 mg/dL (ref 8.9–10.3)
Chloride: 101 mmol/L (ref 101–111)
Creatinine, Ser: 1.74 mg/dL — ABNORMAL HIGH (ref 0.44–1.00)
GFR calc Af Amer: 30 mL/min — ABNORMAL LOW (ref >60)
GFR calc non Af Amer: 26 mL/min — ABNORMAL LOW (ref >60)
Glucose, Bld: 177 mg/dL — ABNORMAL HIGH (ref 65–99)
Potassium: 5.1 mmol/L (ref 3.5–5.1)
Sodium: 141 mmol/L (ref 135–145)

## 2017-03-22 LAB — CBC WITH DIFFERENTIAL/PLATELET
Basophils Absolute: 0 10*3/uL (ref 0.0–0.1)
Basophils Relative: 0 %
Eosinophils Absolute: 0.3 10*3/uL (ref 0.0–0.7)
Eosinophils Relative: 4 %
HCT: 38.4 % (ref 36.0–46.0)
Hemoglobin: 12.1 g/dL (ref 12.0–15.0)
Lymphocytes Relative: 26 %
Lymphs Abs: 1.9 10*3/uL (ref 0.7–4.0)
MCH: 30.3 pg (ref 26.0–34.0)
MCHC: 31.5 g/dL (ref 30.0–36.0)
MCV: 96 fL (ref 78.0–100.0)
Monocytes Absolute: 0.8 10*3/uL (ref 0.1–1.0)
Monocytes Relative: 11 %
Neutro Abs: 4.4 10*3/uL (ref 1.7–7.7)
Neutrophils Relative %: 59 %
Platelets: 212 10*3/uL (ref 150–400)
RBC: 4 MIL/uL (ref 3.87–5.11)
RDW: 14.7 % (ref 11.5–15.5)
WBC: 7.4 10*3/uL (ref 4.0–10.5)

## 2017-03-22 LAB — CBG MONITORING, ED: Glucose-Capillary: 186 mg/dL — ABNORMAL HIGH (ref 65–99)

## 2017-03-22 MED ORDER — SULFAMETHOXAZOLE-TRIMETHOPRIM 800-160 MG PO TABS: 1.0000 | ORAL_TABLET | Freq: Two times a day (BID) | ORAL | 0 refills | Status: AC

## 2017-03-22 MED ORDER — SODIUM CHLORIDE 0.9 % IV BOLUS (SEPSIS)
500.0000 mL | Freq: Once | INTRAVENOUS | Status: AC
  Administered 2017-03-22: 500 mL via INTRAVENOUS

## 2017-03-22 MED ORDER — DEXTROSE 5 % IV SOLN
1.0000 g | Freq: Once | INTRAVENOUS | Status: AC
  Administered 2017-03-22: 1 g via INTRAVENOUS
  Filled 2017-03-22: qty 10

## 2017-03-22 MED ORDER — CEPHALEXIN 500 MG PO CAPS: 500.0000 mg | ORAL_CAPSULE | Freq: Four times a day (QID) | ORAL | 0 refills | Status: DC

## 2017-03-22 NOTE — ED Provider Notes (Signed)
[facility] DEPT Provider Note   CSN: [id number] Arrival date & time: 03/22/17  1015     History   Chief Complaint Chief Complaint  Patient presents with  . Wound Check    HPI  Angela Lawson is a 83 y.o. female with hx of diabetes mellitus, COPD, lung cancer who presents to the Emergency Department complaining of L great toe pain with onset x1 week. Pt reportedly had an ingrown toenail of the L great toe removed x11 days ago. The wound has since become infected with redness and swelling that began x1 week ago. Pt states that she is able to ambulate. Pt reports L great toe pain, decreased sensation in L great toe. She has taken Keflex with no relief. Pt denies leg pain, weakness, dizziness, fever and vomiting. Of note, daughter states that pt was started on a course of 500 mg Keflex  twice daily x4 days ago. Daughter reports that at this time pt's wound began to worsen. Pt is on O2 at home.   The history is provided by the patient and a relative. No language interpreter was used.    Past Medical History:  Diagnosis Date  . Adenocarcinoma of lung ([place])    Left lung 2009, resected  . Arthritis   . Back pain   . CHF (congestive heart failure) ([place])   . COPD (chronic obstructive pulmonary disease) ([place])   . Diverticulitis   . Dyslipidemia   . Essential hypertension   . H/O ventral hernia   . Non-obstructive CAD    a. 04/2015 NSTEMI/Cath: LAD 10p, LCX 10m RCA 330m20d, EF 35-40 w/ apical ballooning.  . On home O2    3L N/C  . Osteoporosis   . Osteoporosis 11/04/2015   Managed by Dr. FaLegrand Rams . Parkinson's disease ([facility]CMarfa  . Shortness of breath   . Takotsubo cardiomyopathy    a. 04/2015 Echo: EF 45-50%, mid-dist anterior/apical/inferoapical HK w/ hyperdynamic base. Gr 1 DD, mild AI, mild-mod MR, triv TR, PASP 4827m;  b. 04/2015 LV gram: Ef 35-40% w/ apical ballooning.  . Type II diabetes mellitus (HCC[place] . Ventricular bigeminy    a. 04/2015 in setting of NSTEMI/Takotsubo.     Patient Active Problem List   Diagnosis Date Noted  . COPD with acute exacerbation ([facility]CC[place]5/19/2017  . Acute on chronic respiratory failure with hypoxia ([facility]CC[place] [place]5/19/2017  . SOB (shortness of breath)   . Osteoporosis 11/04/2015  . CHF (congestive heart failure) (HCCBat[place]8/24/2016  . CHF exacerbation (HCCTallas[place]8/12/2014  . Essential hypertension 04/19/2015  . Type II diabetes mellitus ([place]CC[place]5/11/2015  . Dyslipidemia 04/19/2015  . History of lung cancer 04/19/2015  . Takotsubo cardiomyopathy 04/19/2015  . Ventricular bigeminy 04/19/2015  . NSTEMI (non-ST elevated myocardial infarction) (HCCRome5/11/2015  . Stress-induced cardiomyopathy 04/18/2015  . Elevated troponin 04/16/2015  . COPD exacerbation (HCCDel Rio5/08/2015  . CKD (chronic kidney disease) stage 3, GFR 30-59 ml/min 04/16/2015  . Herpes genitalis in women 11/24/2014  . Toe fracture 09/28/2014  . Non-traumatic tear of right rotator cuff 11/30/2013  . Pain in joint, shoulder region 11/30/2013  . Muscle weakness (generalized) 11/30/2013  . Bursitis of left shoulder 02/09/2013  . Adenocarcinoma of lung (HCCBurnettown9/13/2013    Past Surgical History:  Procedure Laterality Date  . ABDOMINAL HYSTERECTOMY    . CARDIAC CATHETERIZATION N/A 04/17/2015   Procedure: Left Heart Cath and Coronary Angiography;  Surgeon: ThoTroy SineD;  Location: [facility];  Service:  Cardiovascular;  Laterality: N/A;  . CHOLECYSTECTOMY    . COLONOSCOPY N/A 09/18/2014   Procedure: COLONOSCOPY;  Surgeon: Danie Binder, MD;  Location: [facility];  Service: Endoscopy;  Laterality: N/A;  8:30 AM - moved to 10:30 Rosendo Gros to notify pt  . ECTOPIC PREGNANCY SURGERY    . INCISIONAL HERNIA REPAIR N/A 08/26/2013   Procedure: HERNIA REPAIR INCISIONAL WITH MESH;  Surgeon: Jamesetta So, MD;  Location: [facility] ORS;  Service: General;  Laterality: N/A;  . LUNG CANCER SURGERY      OB History    No data available       Home Medications    Prior to  Admission medications   Medication Sig Start Date End Date Taking? Authorizing Provider  albuterol (PROAIR HFA) 108 (90 BASE) MCG/ACT inhaler Inhale 2 puffs into the lungs every 4 (four) hours as needed for wheezing or shortness of breath.   Yes Historical Provider, MD  alendronate (FOSAMAX) 70 MG tablet Take 70 mg by mouth once a week. On Fridays 03/14/15  Yes Historical Provider, MD  aspirin EC 81 MG tablet Take 81 mg by mouth daily.   Yes Historical Provider, MD  atorvastatin (LIPITOR) 40 MG tablet Take 1 tablet (40 mg total) by mouth daily. 10/02/16 03/22/17 Yes Jettie Booze, MD  B-D ULTRAFINE III SHORT PEN 31G X 8 MM MISC See admin instructions. 05/28/16  Yes Historical Provider, MD  benzonatate (TESSALON) 100 MG capsule TAKE ONE CAPSULE 3 TIMES A DAY 01/05/17  Yes Historical Provider, MD  buPROPion (WELLBUTRIN SR) 150 MG 12 hr tablet Take 150 mg by mouth 2 (two) times daily.   Yes Historical Provider, MD  Cholecalciferol (VITAMIN D-3) 1000 UNITS CAPS Take 1 capsule by mouth daily.   Yes Historical Provider, MD  fish oil-omega-3 fatty acids 1000 MG capsule Take 1 g by mouth 2 (two) times daily.    Yes Historical Provider, MD  furosemide (LASIX) 40 MG tablet Take 1 tablet by mouth daily, take 1 extra pill daily as needed for weight increase of 3 lbs in a day or 5 lbs in a week Patient taking differently: Take 40 mg by mouth daily.  05/21/16  Yes Isaiah Serge, NP  gabapentin (NEURONTIN) 300 MG capsule Take 300 mg by mouth daily.    Yes Historical Provider, MD  Garlic 937 MG TABS Take 300 mg by mouth daily.   Yes Historical Provider, MD  ipratropium-albuterol (DUONEB) 0.5-2.5 (3) MG/3ML SOLN Inhale 3 mLs into the lungs 4 (four) times daily.  09/05/14  Yes Historical Provider, MD  LANTUS SOLOSTAR 100 UNIT/ML Solostar Pen Inject 15 Units into the skin every morning.  05/21/16  Yes Historical Provider, MD  linagliptin (TRADJENTA) 5 MG TABS tablet Take 5 mg by mouth daily.   Yes Historical Provider,  MD  loratadine (CLARITIN) 10 MG tablet Take 10 mg by mouth daily. 05/01/16  Yes Historical Provider, MD  losartan (COZAAR) 50 MG tablet Take 50 mg by mouth daily. 06/29/15  Yes Historical Provider, MD  meclizine (ANTIVERT) 25 MG tablet Take 25 mg by mouth daily.  09/19/16  Yes Historical Provider, MD  metoprolol succinate (TOPROL-XL) 25 MG 24 hr tablet Take 1 tablet (25 mg total) by mouth daily. 06/26/15  Yes Jettie Booze, MD  nitroGLYCERIN (NITROSTAT) 0.4 MG SL tablet Place 0.4 mg under the tongue every 5 (five) minutes as needed for chest pain.   Yes Historical Provider, MD  omeprazole (PRILOSEC) 20 MG capsule Take 1  capsule (20 mg total) by mouth daily. 10/01/16  Yes Jettie Booze, MD  OXYGEN Inhale 3 L into the lungs continuous.   Yes Historical Provider, MD  potassium chloride (K-DUR) 10 MEQ tablet Take 1 tablet (10 mEq total) by mouth daily. 05/21/16  Yes Isaiah Serge, NP  pyridoxine (B-6) 100 MG tablet Take 100 mg by mouth daily. Reported on 05/28/2016   Yes Historical Provider, MD  rOPINIRole (REQUIP) 0.25 MG tablet Take 0.25 mg by mouth 3 (three) times daily.  09/05/14  Yes Historical Provider, MD  sodium chloride (OCEAN) 0.65 % SOLN nasal spray Place 1 spray into both nostrils as needed for congestion.   Yes Historical Provider, MD  traMADol (ULTRAM) 50 MG tablet Take 1 tablet by mouth every 4 (four) hours as needed for moderate pain.  06/03/16  Yes Historical Provider, MD  cephALEXin (KEFLEX) 500 MG capsule Take 1 capsule (500 mg total) by mouth 4 (four) times daily. 03/22/17   Daleen Bo, MD  sulfamethoxazole-trimethoprim (BACTRIM DS,SEPTRA DS) 800-160 MG tablet Take 1 tablet by mouth 2 (two) times daily. 03/22/17 03/29/17  Daleen Bo, MD    Family History Family History  Problem Relation Age of Onset  . Diabetes Mother   . Hypertension Mother   . Diabetes Father   . Asthma    . Cancer    . Heart attack Sister     X2  . Colon cancer Neg Hx     Social  History Social History  Substance Use Topics  . Smoking status: Former Smoker    Years: 20.00    Types: Cigarettes    Quit date: 08/13/2006  . Smokeless tobacco: Never Used  . Alcohol use No     Allergies   Lisinopril   Review of Systems Review of Systems  Constitutional: Negative for fever.  Gastrointestinal: Negative for nausea and vomiting.  Musculoskeletal: Positive for myalgias (L great toe).  Skin: Positive for wound (L great toe).  Neurological: Negative for dizziness and weakness.     Physical Exam Updated Vital Signs BP (!) 114/55   Pulse 68   Temp 97.6 F (36.4 C) (Oral)   Resp 16   Ht '5\' 4"'$  (1.626 m)   Wt 173 lb (78.5 kg)   SpO2 99%   BMI 29.70 kg/m   Physical Exam  Constitutional: She is oriented to person, place, and time. She appears well-developed and well-nourished.  HENT:  Head: Normocephalic and atraumatic.  Eyes: Conjunctivae and EOM are normal. Pupils are equal, round, and reactive to light.  Neck: Normal range of motion and phonation normal. Neck supple.  Cardiovascular: Normal rate.   Pulmonary/Chest: Effort normal.  Musculoskeletal: Normal range of motion.  L great toe with absent nail. Nailbed had adherent clot/scar cuticle is slightly inflamed and swollen.  Mild erythema of the pad of the great toe and dorsal toe. No proximal streaking.  Neurological: She is alert and oriented to person, place, and time. She exhibits normal muscle tone.  Skin: Skin is warm and dry.  Psychiatric: She has a normal mood and affect. Her behavior is normal. Judgment and thought content normal.  Nursing note and vitals reviewed.    ED Treatments / Results   DIAGNOSTIC STUDIES: Oxygen Saturation is 93% on RA, inadequate by my interpretation.   COORDINATION OF CARE: 1:34 PM-Discussed next steps with pt. Pt verbalized understanding and is agreeable with the plan.    Labs (all labs ordered are listed, but only abnormal results are displayed)  Labs  Reviewed  BASIC METABOLIC PANEL - Abnormal; Notable for the following:       Result Value   Glucose, Bld 177 (*)    BUN 33 (*)    Creatinine, Ser 1.74 (*)    GFR calc non Af Amer 26 (*)    GFR calc Af Amer 30 (*)    All other components within normal limits  CBG MONITORING, ED - Abnormal; Notable for the following:    Glucose-Capillary 186 (*)    All other components within normal limits  CBC WITH DIFFERENTIAL/PLATELET    EKG  EKG Interpretation None       Radiology No results found.  Procedures Procedures (including critical care time)  Medications Ordered in ED Medications  sodium chloride 0.9 % bolus 500 mL (0 mLs Intravenous Stopped 03/22/17 1345)  cefTRIAXone (ROCEPHIN) 1 g in dextrose 5 % 50 mL IVPB (0 g Intravenous Stopped 03/22/17 1323)     Initial Impression / Assessment and Plan / ED Course  I have reviewed the triage vital signs and the nursing notes.  Pertinent labs & imaging results that were available during my care of the patient were reviewed by me and considered in my medical decision making (see chart for details).     Medications  sodium chloride 0.9 % bolus 500 mL (0 mLs Intravenous Stopped 03/22/17 1345)  cefTRIAXone (ROCEPHIN) 1 g in dextrose 5 % 50 mL IVPB (0 g Intravenous Stopped 03/22/17 1323)    Patient Vitals for the past 24 hrs:  BP Temp Temp src Pulse Resp SpO2  03/22/17 1448 - 97.6 F (36.4 C) Oral - 16 -  03/22/17 1430 (!) 114/55 - - 68 - 99 %  03/22/17 1400 (!) 120/52 - - 68 - 100 %    At discharge reevaluation with update and discussion. After initial assessment and treatment, an updated evaluation reveals she remains comfortable and has no further complaints.  Findings discussed with the patient and her family member, all questions answered. Kristol Almanzar L    Final Clinical Impressions(s) / ED Diagnoses   Final diagnoses:  Wound infection   Wound infection with mild cellulitis of the toe not extending to the foot, or  systemically.  Doubt metabolic instability or impending vascular collapse.  Will add second antibiotic, for MRSA coverage.  Nursing Notes Reviewed/ Care Coordinated Applicable Imaging Reviewed Interpretation of Laboratory Data incorporated into ED treatment  The patient appears reasonably screened and/or stabilized for discharge and I doubt any other medical condition or other [facility] requiring further screening, evaluation, or treatment in the ED at this time prior to discharge.  Plan: Home Medications-continue usual; Home Treatments-warm soaks 3 times daily Neosporin if desired; return here if the recommended treatment, does not improve the symptoms; Recommended follow up-PCP or podiatry follow-up in 1 week, and as needed.   New Prescriptions Discharge Medication List as of 03/22/2017  2:38 PM    START taking these medications   Details  sulfamethoxazole-trimethoprim (BACTRIM DS,SEPTRA DS) 800-160 MG tablet Take 1 tablet by mouth 2 (two) times daily., Starting Sun 03/22/2017, Until Sun 03/29/2017, Normal         Daleen Bo, MD 03/23/17 1336

## 2017-03-22 NOTE — Discharge Instructions (Signed)
Continue soaking the left foot, several times a day.  Also elevate the left foot above your heart, to improve the swelling and discomfort.  Take the cephalexin, 4 times a day, then follow the instructions on the new prescription.  Also start the prescription for trimethoprim-sulfamethoxazole, today.  Return here, if needed, for problems.

## 2017-03-22 NOTE — ED Notes (Addendum)
Pt had left great toenail removed on 03/11/17. Pt reports she started having pain, swelling, black colored skin where toenail was removed and her daughter called the surgeon and he placed pt on Cipro on 03/18/17. Pt reports no change in symptoms since starting Cipro 4 days ago. Pt reports her toe changes to a "dark color" when she stands on it. Pt reports her blood sugars are always high and no change in them since surgery.

## 2017-03-22 NOTE — ED Triage Notes (Signed)
Patient c/o pain and swelling to left great toe. Per patient had toe nail removed on 03/11/17. Per patient small amount of drainage. Denies any fevers. Swelling with necrotic tissue and redness noted. Patient is diabetic. Per patient blood sugar 125 this morning.

## 2017-03-23 ENCOUNTER — Ambulatory Visit (HOSPITAL_COMMUNITY): Payer: Medicare Other

## 2017-03-25 ENCOUNTER — Ambulatory Visit (HOSPITAL_COMMUNITY)
Admission: RE | Admit: 2017-03-25 | Discharge: 2017-03-25 | Disposition: A | Discharge status: Home / Self Care | Payer: Medicare Other | Source: Ambulatory Visit | Attending: Oncology | Admitting: Oncology

## 2017-03-25 DIAGNOSIS — J449 Chronic obstructive pulmonary disease, unspecified: Secondary | ICD-10-CM | POA: Diagnosis not present

## 2017-03-25 DIAGNOSIS — J439 Emphysema, unspecified: Secondary | ICD-10-CM | POA: Diagnosis not present

## 2017-03-25 DIAGNOSIS — C3490 Malignant neoplasm of unspecified part of unspecified bronchus or lung: Secondary | ICD-10-CM | POA: Diagnosis present

## 2017-03-25 DIAGNOSIS — L03031 Cellulitis of right toe: Secondary | ICD-10-CM | POA: Diagnosis not present

## 2017-03-25 DIAGNOSIS — E119 Type 2 diabetes mellitus without complications: Secondary | ICD-10-CM | POA: Diagnosis not present

## 2017-03-25 DIAGNOSIS — I2510 Atherosclerotic heart disease of native coronary artery without angina pectoris: Secondary | ICD-10-CM | POA: Diagnosis not present

## 2017-03-25 DIAGNOSIS — I700 Atherosclerosis of aorta: Secondary | ICD-10-CM | POA: Diagnosis not present

## 2017-03-25 DIAGNOSIS — R918 Other nonspecific abnormal finding of lung field: Secondary | ICD-10-CM | POA: Diagnosis not present

## 2017-03-25 DIAGNOSIS — I10 Essential (primary) hypertension: Secondary | ICD-10-CM | POA: Diagnosis not present

## 2017-03-26 ENCOUNTER — Encounter (HOSPITAL_COMMUNITY): Payer: Medicare Other | Attending: Adult Health | Admitting: Adult Health

## 2017-03-26 VITALS — BP 101/40 | HR 70 | Resp 20

## 2017-03-26 DIAGNOSIS — C3490 Malignant neoplasm of unspecified part of unspecified bronchus or lung: Secondary | ICD-10-CM | POA: Diagnosis not present

## 2017-03-26 DIAGNOSIS — R918 Other nonspecific abnormal finding of lung field: Secondary | ICD-10-CM | POA: Diagnosis not present

## 2017-03-26 NOTE — Patient Instructions (Addendum)
[place] at [facility] Discharge Instructions  RECOMMENDATIONS MADE BY THE CONSULTANT AND ANY TEST RESULTS WILL BE SENT TO YOUR REFERRING PHYSICIAN.  You were seen today by Mike Craze NP Follow up in 1 months We will let you know about the biopsy   Thank you for choosing [place] at [facility] to provide your oncology and hematology care.  To afford each patient quality time with our provider, please arrive at least 15 minutes before your scheduled appointment time.    If you have a lab appointment with the [facility] please come in thru the  Main Entrance and check in at the main information desk  You need to re-schedule your appointment should you arrive 10 or more minutes late.  We strive to give you quality time with our providers, and arriving late affects you and other patients whose appointments are after yours.  Also, if you no show three or more times for appointments you may be dismissed from the clinic at the providers discretion.     Again, thank you for choosing [facility].  Our hope is that these requests will decrease the amount of time that you wait before being seen by our physicians.       _____________________________________________________________  Should you have questions after your visit to [facility], please contact our office at [phone] between the hours of 8:30 a.m. and 4:30 p.m.  Voicemails left after 4:30 p.m. will not be returned until the following business day.  For prescription refill requests, have your pharmacy contact our office.       Resources For Cancer Patients and their Caregivers ? American Cancer Society: Can assist with transportation, wigs, general needs, runs Look Good Feel Better.        930-767-3280 ? Cancer Care: Provides financial assistance, online support groups, medication/co-pay assistance.  1-800-813-HOPE (908)521-7229) ? [facility] Assists [place] cancer patients and their families through emotional , educational and financial support.  (734)104-8922 ? [place] DSS Where to apply for food stamps, Medicaid and utility assistance. 772-501-9948 ? [facility]: Transportation to medical appointments. (403)774-8447 ? Social Security Administration: May apply for disability if have a Stage IV cancer. 346-117-3213 (608)539-1354 ? [employer], Disability and Transit Services: Assists with nutrition, care and transit needs. [place] Support Programs: '@10RELATIVEDAYS'$ @ > Cancer Support Group  2nd Tuesday of the month 1pm-2pm, Journey Room  > Creative Journey  3rd Tuesday of the month 1130am-1pm, Journey Room  > Look Good Feel Better  1st Wednesday of the month 10am-12 noon, Journey Room (Call [place] to register [phone])

## 2017-03-26 NOTE — Progress Notes (Signed)
[place], [place] [address]   CLINIC:  Medical Oncology/Hematology  PCP:  Rosita Fire, MD [place] [phone]   REASON FOR VISIT:  Follow-up for suspicious lung lesions on CT and PET scan; h/o Stage IA NSCLC s/p resection in 2009.   CURRENT THERAPY: Initial work-up    BRIEF ONCOLOGIC HISTORY:  Oncology History   stage I A., non-small cell lung cancer, consistent with a poorly differentiated adenocarcinoma, status post resection 07/07/2008 in [place].       Adenocarcinoma of lung ([place])   07/07/2008 Surgery    Performed in [place], [place].  Consistent with poorly differentiated adenocarcinoma.      02/27/2014 Imaging    CT of chest demonstrating a RLL solid nodular component measuring 9 mm at the site of previous ground-glass opacity. Slight increase in size of an anterior mediastinal node measuring 1.1 cm with stable 1 cm pretracheal lymph node. Consider PET      03/20/2014 Imaging    PET scan- No hypermetabolic pulmonary lesions to suggest recurrent or metastatic pulmonary disease.  Weakly positive left internal mammary lymph node and left axillary lymph node (? inflammatory process involving the left breast).      11/07/2015 Imaging    CT chest- 1. No evidence of metastatic involvement of the lungs. The previously noted small noncalcified lung nodules are stable. 2. Vague focal opacity in the right lower lobe may represent scarring and is stable. Continued followup however is recommended in 1 year.      01/08/2017 Imaging    CT chest- 1. New nodules along a thick walled bullous lesion in the right lower lobe, suspicious for malignancy. 2. New fairly large region of confluent ground-glass opacity posteriorly in the remaining left lung, low grade adenocarcinoma is not excluded and there is mild new nodularity along the upper margin of this process. 3. Stable biapical pleuroparenchymal  scarring with several stable nodules in the left lung. 4. Borderline adenopathy in the mediastinum, essentially stable from prior. 5. Coronary, aortic arch, and branch vessel atherosclerotic vascular disease. 6. Small hiatal hernia. 7. Hypodense left thyroid lesion. If not previously worked up, thyroid ultrasound could be employed.      01/21/2017 PET scan    1. New hypermetabolic adenopathy in the mediastinum and hila, with progressive hypermetabolic ground-glass density posteriorly in the left lower lobe with faint nodular components, and some low-grade hypermetabolic activity in ground-glass opacity dependently in the right upper lobe. Although the appearance is concerning for malignancy potentially with a lymphangitic component in the left lower lobe, the unusual configuration in the appearance of the ground-glass opacities could conceivably represent an atypical granulomatous infectious process as an alternative. This is not classic obvious recurrence with well-defined mass or nodules. 2. There is some ill-defined clustered nodularity in the right lower lobe only faintly metabolic, maximum SUV 3.0. 3. Overall I believe that this process require surveillance to differentiate an inflammatory/infectious condition from recurrent malignancy. The adenopathy is certainly concerning. 4. No findings of malignancy in the neck, abdomen/pelvis, or skeleton. 5. Sigmoid colon diverticulosis. 6. Emphysema. 7. Chronic ethmoid sinusitis.      03/25/2017 Imaging    CT chest- Stable 5.9 cm left lower lobe ground-glass opacity, hypermetabolic on prior PET, worrisome for primary bronchogenic neoplasm such as low-grade adenocarcinoma.  Stable 2.2 cm cavitary lesion in the central right lower lobe with surrounding hypermetabolic nodularity measuring up to 1.8 cm, worrisome for primary bronchogenic neoplasm such  as squamous cell carcinoma.  Hypermetabolic mediastinal lymph nodes measuring up  to 11 mm short axis, as above. Bilateral hilar regions are hypermetabolic on PET but poorly evaluated on unenhanced CT.  Emphysema and aortic atherosclerosis.        HISTORY OF PRESENT ILLNESS:  (From Kirby Crigler, PA-C's last note on 12/05/16)     INTERVAL HISTORY:  Ms. Lio presents today for follow-up for possible recurrent lung cancer.   On 01/07/17, she had CT chest for surveillance of her lung cancer; new nodules were noted and concerning for malignancy.  She was recently admitted on 01/10/17-01/13/17 for COPD exacerbation; she was discharged on oral steroids at that time.  On 01/21/17, she underwent PET scan revealing new hypermetabolic adenopathy in the mediastinum and hilum, with progressive hypermetabolic groundglass density in the left lower lobe; there is mention of possible concern of malignancy with a lymphangitic component in the left lower lobe, but this may also represent an atypical granulomatous infectious process as well; there were no findings of malignancy in the neck, abdomen/pelvis, or skeleton. It was recommended by cardiothoracic surgery that we repeat imaging in 2-3 months after hospitalization to re-assess lesions noted to CT and PET scans.    She recently underwent repeat CT chest on 03/25/17 and is here to review those results. She is here today with her daughter.  Overall, she tells me that she feels very fatigued; reports her energy levels as extremely low.  On 03/22/17, she presented to ED with (L) great toe pain; she was found to have wound infection with mild cellulitis. She was given antibiotics and discharged home in stable condition. She tells me that since that time, the toenail has been removed and she no longer has toe pain.    She remains on continuous O2; this is chronic for her. Denies any worsening shortness of breath or cough; she chronically has these symptoms.  She underwent swallow study, which showed some mild aspiration.  Her swallowing difficulties  have reportedly improved.  Endorses dizziness at times; denies headaches. Endorses easy bruising/bleeding. Denies any bowel or bladder changes.   She is here today to review her CT scan results and discuss next steps.    REVIEW OF SYSTEMS:  Review of Systems  Constitutional: Positive for fatigue. Negative for chills and fever.  HENT:   Positive for trouble swallowing (improved).   Eyes: Negative.   Respiratory: Positive for cough and shortness of breath. Negative for hemoptysis.   Cardiovascular: Negative.  Negative for chest pain and leg swelling.  Gastrointestinal: Negative.  Negative for abdominal pain, blood in stool, constipation, diarrhea, nausea and vomiting.  Genitourinary: Negative.  Negative for dysuria, hematuria and vaginal bleeding.   Musculoskeletal: Negative.   Skin: Negative.  Negative for rash.  Neurological: Positive for dizziness. Negative for headaches.  Hematological: Bruises/bleeds easily.  Psychiatric/Behavioral: Negative.      PAST MEDICAL/SURGICAL HISTORY:  Past Medical History:  Diagnosis Date  . Adenocarcinoma of lung ([place])    Left lung 2009, resected  . Arthritis   . Back pain   . CHF (congestive heart failure) ([place])   . COPD (chronic obstructive pulmonary disease) ([place])   . Diverticulitis   . Dyslipidemia   . Essential hypertension   . H/O ventral hernia   . Non-obstructive CAD    a. 04/2015 NSTEMI/Cath: LAD 10p, LCX 68m RCA 379m20d, EF 35-40 w/ apical ballooning.  . On home O2    3L N/C  . Osteoporosis   . Osteoporosis  11/04/2015   Managed by Dr. Legrand Rams   . Parkinson's disease ([place])   . Shortness of breath   . Takotsubo cardiomyopathy    a. 04/2015 Echo: EF 45-50%, mid-dist anterior/apical/inferoapical HK w/ hyperdynamic base. Gr 1 DD, mild AI, mild-mod MR, triv TR, PASP 65mHg;  b. 04/2015 LV gram: Ef 35-40% w/ apical ballooning.  . Type II diabetes mellitus ([place]   . Ventricular bigeminy    a. 04/2015 in setting of NSTEMI/Takotsubo.    Past Surgical History:  Procedure Laterality Date  . ABDOMINAL HYSTERECTOMY    . CARDIAC CATHETERIZATION N/A 04/17/2015   Procedure: Left Heart Cath and Coronary Angiography;  Surgeon: TTroy Sine MD;  Location: [facility];  Service: Cardiovascular;  Laterality: N/A;  . CHOLECYSTECTOMY    . COLONOSCOPY N/A 09/18/2014   Procedure: COLONOSCOPY;  Surgeon: SDanie Binder MD;  Location: [facility];  Service: Endoscopy;  Laterality: N/A;  8:30 AM - moved to 10:30 -Rosendo Grosto notify pt  . ECTOPIC PREGNANCY SURGERY    . INCISIONAL HERNIA REPAIR N/A 08/26/2013   Procedure: HERNIA REPAIR INCISIONAL WITH MESH;  Surgeon: MJamesetta So MD;  Location: [facility];  Service: General;  Laterality: N/A;  . LUNG CANCER SURGERY       SOCIAL HISTORY:  Social History   Social History  . Marital status: Widowed    Spouse name: N/A  . Number of children: N/A  . Years of education: N/A   Occupational History  . Not on file.   Social History Main Topics  . Smoking status: Former Smoker    Years: 20.00    Types: Cigarettes    Quit date: 08/13/2006  . Smokeless tobacco: Never Used  . Alcohol use No  . Drug use: No  . Sexual activity: Not Currently    Birth control/ protection: Surgical   Other Topics Concern  . Not on file   Social History Narrative  . No narrative on file    FAMILY HISTORY:  Family History  Problem Relation Age of Onset  . Diabetes Mother   . Hypertension Mother   . Diabetes Father   . Asthma    . Cancer    . Heart attack Sister     X2  . Colon cancer Neg Hx     CURRENT MEDICATIONS:  Outpatient Encounter Prescriptions as of 03/26/2017  Medication Sig  . albuterol (PROAIR HFA) 108 (90 BASE) MCG/ACT inhaler Inhale 2 puffs into the lungs every 4 (four) hours as needed for wheezing or shortness of breath.  .Marland Kitchenalendronate (FOSAMAX) 70 MG tablet Take 70 mg by mouth once a week. On Fridays  . aspirin EC 81 MG tablet Take 81 mg by mouth daily.  . B-D  ULTRAFINE III SHORT PEN 31G X 8 MM MISC See admin instructions.  . benzonatate (TESSALON) 100 MG capsule TAKE ONE CAPSULE 3 TIMES A DAY  . buPROPion (WELLBUTRIN SR) 150 MG 12 hr tablet Take 150 mg by mouth 2 (two) times daily.  . cephALEXin (KEFLEX) 500 MG capsule Take 1 capsule (500 mg total) by mouth 4 (four) times daily.  . Cholecalciferol (VITAMIN D-3) 1000 UNITS CAPS Take 1 capsule by mouth daily.  . fish oil-omega-3 fatty acids 1000 MG capsule Take 1 g by mouth 2 (two) times daily.   . furosemide (LASIX) 40 MG tablet Take 1 tablet by mouth daily, take 1 extra pill daily as needed for weight increase of 3 lbs in a day or  5 lbs in a week (Patient taking differently: Take 40 mg by mouth daily. )  . gabapentin (NEURONTIN) 300 MG capsule Take 300 mg by mouth daily.   . Garlic 629 MG TABS Take 300 mg by mouth daily.  Marland Kitchen ipratropium-albuterol (DUONEB) 0.5-2.5 (3) MG/3ML SOLN Inhale 3 mLs into the lungs 4 (four) times daily.   Marland Kitchen LANTUS SOLOSTAR 100 UNIT/ML Solostar Pen Inject 15 Units into the skin every morning.   . linagliptin (TRADJENTA) 5 MG TABS tablet Take 5 mg by mouth daily.  Marland Kitchen loratadine (CLARITIN) 10 MG tablet Take 10 mg by mouth daily.  Marland Kitchen losartan (COZAAR) 50 MG tablet Take 50 mg by mouth daily.  . meclizine (ANTIVERT) 25 MG tablet Take 25 mg by mouth daily.   . metoprolol succinate (TOPROL-XL) 25 MG 24 hr tablet Take 1 tablet (25 mg total) by mouth daily.  . nitroGLYCERIN (NITROSTAT) 0.4 MG SL tablet Place 0.4 mg under the tongue every 5 (five) minutes as needed for chest pain.  Marland Kitchen omeprazole (PRILOSEC) 20 MG capsule Take 1 capsule (20 mg total) by mouth daily.  . OXYGEN Inhale 3 L into the lungs continuous.  . potassium chloride (K-DUR) 10 MEQ tablet Take 1 tablet (10 mEq total) by mouth daily.  Marland Kitchen pyridoxine (B-6) 100 MG tablet Take 100 mg by mouth daily. Reported on 05/28/2016  . rOPINIRole (REQUIP) 0.25 MG tablet Take 0.25 mg by mouth 3 (three) times daily.   . sodium chloride (OCEAN)  0.65 % SOLN nasal spray Place 1 spray into both nostrils as needed for congestion.  . sulfamethoxazole-trimethoprim (BACTRIM DS,SEPTRA DS) 800-160 MG tablet Take 1 tablet by mouth 2 (two) times daily.  . traMADol (ULTRAM) 50 MG tablet Take 1 tablet by mouth every 4 (four) hours as needed for moderate pain.   Marland Kitchen atorvastatin (LIPITOR) 40 MG tablet Take 1 tablet (40 mg total) by mouth daily.   No facility-administered encounter medications on file as of 03/26/2017.     ALLERGIES:  Allergies  Allergen Reactions  . Lisinopril Cough     PHYSICAL EXAM:  ECOG Performance status: 2 - Symptomatic, requires assistance.   Vitals:   03/26/17 0953  BP: (!) 101/40  Pulse: 70  Resp: 20     Physical Exam  Constitutional: She is oriented to person, place, and time and well-developed, well-nourished, and in no distress.  Seen seated in wheelchair  HENT:  Head: Normocephalic.  Mouth/Throat: Oropharynx is clear and moist. No oropharyngeal exudate.  Eyes: Conjunctivae are normal. Pupils are equal, round, and reactive to light. No scleral icterus.  Neck: Normal range of motion. Neck supple.  Cardiovascular: Normal rate and regular rhythm.   Pulmonary/Chest: Effort normal. No respiratory distress. She has wheezes (Expiratory wheezes throughout). She has rhonchi (rhonchi to right upper lobe slightly improves with cough ) in the right upper field, the right lower field, the left upper field and the left lower field.  O2 via nasal cannula in place  Abdominal: Soft. Bowel sounds are normal. There is no tenderness. There is no rebound and no guarding.  Musculoskeletal: Normal range of motion. She exhibits no edema.  Lymphadenopathy:    She has no cervical adenopathy.  Neurological: She is alert and oriented to person, place, and time. No cranial nerve deficit.  Skin: Skin is warm and dry. No rash noted.  (L) great toenail absent; healing wound without evidence of cellulitis.   Psychiatric: Mood,  memory, affect and judgment normal.  Nursing note and vitals reviewed.  LABORATORY DATA:  I have reviewed the labs as listed.  CBC    Component Value Date/Time   WBC 7.4 03/22/2017 1235   RBC 4.00 03/22/2017 1235   HGB 12.1 03/22/2017 1235   HCT 38.4 03/22/2017 1235   PLT 212 03/22/2017 1235   MCV 96.0 03/22/2017 1235   MCH 30.3 03/22/2017 1235   MCHC 31.5 03/22/2017 1235   RDW 14.7 03/22/2017 1235   LYMPHSABS 1.9 03/22/2017 1235   MONOABS 0.8 03/22/2017 1235   EOSABS 0.3 03/22/2017 1235   BASOSABS 0.0 03/22/2017 1235   CMP Latest Ref Rng & Units 03/22/2017 01/13/2017 01/11/2017  Glucose 65 - 99 mg/dL 177(H) 181(H) 225(H)  BUN 6 - 20 mg/dL 33(H) 43(H) 25(H)  Creatinine 0.44 - 1.00 mg/dL 1.74(H) 1.71(H) 1.55(H)  Sodium 135 - 145 mmol/L 141 134(L) 136  Potassium 3.5 - 5.1 mmol/L 5.1 4.9 5.1  Chloride 101 - 111 mmol/L 101 99(L) 100(L)  CO2 22 - 32 mmol/L '31 28 25  '$ Calcium 8.9 - 10.3 mg/dL 9.8 9.7 9.7  Total Protein 6.5 - 8.1 g/dL - - -  Total Bilirubin 0.3 - 1.2 mg/dL - - -  Alkaline Phos 38 - 126 U/L - - -  AST 15 - 41 U/L - - -  ALT 14 - 54 U/L - - -    PENDING LABS:    DIAGNOSTIC IMAGING:  PET scan: 01/21/17     CT chest: 03/25/17      PATHOLOGY:     ASSESSMENT & PLAN:   History of Stage IA NSCLC, s/p resection in 2009:  -New lung masses remain concerning for recurrent malignancy.  Further work-up necessary.   Lung masses/nodules:  -Recent CT imaging reviewed and results discussed with patient/family. -Discussed that contralateral lung masses means, if both are cancer, that we are dealing with Stage IV lung cancer.  We discussed, in general, possible treatment for Stage IV lung cancer, including chemo and possible radiation.  However, we need definitive tissue diagnosis before we can solidify treatment plan for Ms. Okray. -Goals of care were broached today.  She understands that Stage IV lung cancer is not curable, but may be treatable. Discussed that  both chemotherapy and radiation can have significant side effects in some patients. Of course, we provide symptom management for patients both during and after treatment.  However, she has significant chronic lung disease and is oxygen dependent. Her performance status may limit which treatment options may be appropriate for her.  We discussed Ms. Kief's wishes in terms of how aggressive she would like to be with treatment.  She is undecided about chemotherapy, but at this time would consider radiation, if appropriate.  -Discussed the option of pursuing biopsy vs continuing to monitor given her chronic lung disease. She would like to pursue biopsy.  -Discussed 2 possible opportunities for lung biopsy, which includes bronchoscopy vs CT-guided biopsy.  -Reviewed case with Dr. Talbert Cage and message sent to Dr. Roxan Hockey with cardio-thoracic surgery for his review. Awaiting reply from Dr. Roxan Hockey. We will contact the patient with next steps based on Dr. Leonarda Salon recommendations.   -Tentatively, we will make plans to bring Ms. Acrey back in 1 month, or sooner, after we receive tissue confirmation of suspected lung malignancy.     Addendum:  Dr. Roxan Hockey feels the patient is appropriate for bronch with EBUS. Referral placed to cardio-thoracic surgery and they will see patient as soon as able.      Dispo:  -Return to cancer center in 1  month, or sooner, after lung biopsies.    All questions were answered to patient's stated satisfaction. Encouraged patient to call with any new concerns or questions before her next visit to the cancer center and we can certain see her sooner, if needed.     Plan of care discussed with Dr. Talbert Cage, who agrees with the above aforementioned.    A total of 30 minutes was spent in face-to-face care of this patient, with greater than 50% of that time spent in counseling and care-coordination.    Mike Craze, NP [place] [phone]

## 2017-03-26 NOTE — Progress Notes (Deleted)
[place], [place] [address]   CLINIC:  Medical Oncology/Hematology  PCP:  Rosita Fire, MD [place] [address] [phone]   REASON FOR VISIT:  Follow-up for ***  CURRENT THERAPY: ***  BRIEF ONCOLOGIC HISTORY:  Oncology History   stage I A., non-small cell lung cancer, consistent with a poorly differentiated adenocarcinoma, status post resection 07/07/2008 in [place].       Adenocarcinoma of lung ([place])   07/07/2008 Surgery    Performed in [place], [place].  Consistent with poorly differentiated adenocarcinoma.      02/27/2014 Imaging    CT of chest demonstrating a RLL solid nodular component measuring 9 mm at the site of previous ground-glass opacity. Slight increase in size of an anterior mediastinal node measuring 1.1 cm with stable 1 cm pretracheal lymph node. Consider PET      03/20/2014 Imaging    PET scan- No hypermetabolic pulmonary lesions to suggest recurrent or metastatic pulmonary disease.  Weakly positive left internal mammary lymph node and left axillary lymph node (? inflammatory process involving the left breast).      11/07/2015 Imaging    CT chest- 1. No evidence of metastatic involvement of the lungs. The previously noted small noncalcified lung nodules are stable. 2. Vague focal opacity in the right lower lobe may represent scarring and is stable. Continued followup however is recommended in 1 year.      01/08/2017 Imaging    CT chest- 1. New nodules along a thick walled bullous lesion in the right lower lobe, suspicious for malignancy. 2. New fairly large region of confluent ground-glass opacity posteriorly in the remaining left lung, low grade adenocarcinoma is not excluded and there is mild new nodularity along the upper margin of this process. 3. Stable biapical pleuroparenchymal scarring with several stable nodules in the left lung. 4. Borderline adenopathy in the  mediastinum, essentially stable from prior. 5. Coronary, aortic arch, and branch vessel atherosclerotic vascular disease. 6. Small hiatal hernia. 7. Hypodense left thyroid lesion. If not previously worked up, thyroid ultrasound could be employed.      01/21/2017 PET scan    1. New hypermetabolic adenopathy in the mediastinum and hila, with progressive hypermetabolic ground-glass density posteriorly in the left lower lobe with faint nodular components, and some low-grade hypermetabolic activity in ground-glass opacity dependently in the right upper lobe. Although the appearance is concerning for malignancy potentially with a lymphangitic component in the left lower lobe, the unusual configuration in the appearance of the ground-glass opacities could conceivably represent an atypical granulomatous infectious process as an alternative. This is not classic obvious recurrence with well-defined mass or nodules. 2. There is some ill-defined clustered nodularity in the right lower lobe only faintly metabolic, maximum SUV 3.0. 3. Overall I believe that this process require surveillance to differentiate an inflammatory/infectious condition from recurrent malignancy. The adenopathy is certainly concerning. 4. No findings of malignancy in the neck, abdomen/pelvis, or skeleton. 5. Sigmoid colon diverticulosis. 6. Emphysema. 7. Chronic ethmoid sinusitis.      03/25/2017 Imaging    CT chest- Stable 5.9 cm left lower lobe ground-glass opacity, hypermetabolic on prior PET, worrisome for primary bronchogenic neoplasm such as low-grade adenocarcinoma.  Stable 2.2 cm cavitary lesion in the central right lower lobe with surrounding hypermetabolic nodularity measuring up to 1.8 cm, worrisome for primary bronchogenic neoplasm such as squamous cell carcinoma.  Hypermetabolic mediastinal lymph nodes measuring up to 11 mm short axis, as above. Bilateral  hilar regions are hypermetabolic on PET but  poorly evaluated on unenhanced CT.  Emphysema and aortic atherosclerosis.        HISTORY OF PRESENT ILLNESS:     INTERVAL HISTORY:    REVIEW OF SYSTEMS:  Review of Systems - Oncology   PAST MEDICAL/SURGICAL HISTORY:  Past Medical History:  Diagnosis Date  . Adenocarcinoma of lung ([place])    Left lung 2009, resected  . Arthritis   . Back pain   . CHF (congestive heart failure) ([place])   . COPD (chronic obstructive pulmonary disease) ([place])   . Diverticulitis   . Dyslipidemia   . Essential hypertension   . H/O ventral hernia   . Non-obstructive CAD    a. 04/2015 NSTEMI/Cath: LAD 10p, LCX 30m RCA 365m20d, EF 35-40 w/ apical ballooning.  . On home O2    3L N/C  . Osteoporosis   . Osteoporosis 11/04/2015   Managed by Dr. FaLegrand Rams . Parkinson's disease ([facility]  . Shortness of breath   . Takotsubo cardiomyopathy    a. 04/2015 Echo: EF 45-50%, mid-dist anterior/apical/inferoapical HK w/ hyperdynamic base. Gr 1 DD, mild AI, mild-mod MR, triv TR, PASP 4864m;  b. 04/2015 LV gram: Ef 35-40% w/ apical ballooning.  . Type II diabetes mellitus ([facility] . Ventricular bigeminy    a. 04/2015 in setting of NSTEMI/Takotsubo.   Past Surgical History:  Procedure Laterality Date  . ABDOMINAL HYSTERECTOMY    . CARDIAC CATHETERIZATION N/A 04/17/2015   Procedure: Left Heart Cath and Coronary Angiography;  Surgeon: ThoTroy SineD;  Location: [facility];  Service: Cardiovascular;  Laterality: N/A;  . CHOLECYSTECTOMY    . COLONOSCOPY N/A 09/18/2014   Procedure: COLONOSCOPY;  Surgeon: SanDanie BinderD;  Location: [facility];  Service: Endoscopy;  Laterality: N/A;  8:30 AM - moved to 10:30 - CRosendo Gros notify pt  . ECTOPIC PREGNANCY SURGERY    . INCISIONAL HERNIA REPAIR N/A 08/26/2013   Procedure: HERNIA REPAIR INCISIONAL WITH MESH;  Surgeon: MarJamesetta SoD;  Location: [facility];  Service: General;  Laterality: N/A;  . LUNG CANCER SURGERY       SOCIAL HISTORY:  Social History     Social History  . Marital status: Widowed    Spouse name: N/A  . Number of children: N/A  . Years of education: N/A   Occupational History  . Not on file.   Social History Main Topics  . Smoking status: Former Smoker    Years: 20.00    Types: Cigarettes    Quit date: 08/13/2006  . Smokeless tobacco: Never Used  . Alcohol use No  . Drug use: No  . Sexual activity: Not Currently    Birth control/ protection: Surgical   Other Topics Concern  . Not on file   Social History Narrative  . No narrative on file    FAMILY HISTORY:  Family History  Problem Relation Age of Onset  . Diabetes Mother   . Hypertension Mother   . Diabetes Father   . Asthma    . Cancer    . Heart attack Sister     X2  . Colon cancer Neg Hx     CURRENT MEDICATIONS:  Outpatient Encounter Prescriptions as of 03/26/2017  Medication Sig  . albuterol (PROAIR HFA) 108 (90 BASE) MCG/ACT inhaler Inhale 2 puffs into the lungs every 4 (four) hours as needed for wheezing or shortness of breath.  . aMarland Kitchenendronate (FOSAMAX)  70 MG tablet Take 70 mg by mouth once a week. On Fridays  . aspirin EC 81 MG tablet Take 81 mg by mouth daily.  Marland Kitchen atorvastatin (LIPITOR) 40 MG tablet Take 1 tablet (40 mg total) by mouth daily.  . B-D ULTRAFINE III SHORT PEN 31G X 8 MM MISC See admin instructions.  . benzonatate (TESSALON) 100 MG capsule TAKE ONE CAPSULE 3 TIMES A DAY  . buPROPion (WELLBUTRIN SR) 150 MG 12 hr tablet Take 150 mg by mouth 2 (two) times daily.  . cephALEXin (KEFLEX) 500 MG capsule Take 1 capsule (500 mg total) by mouth 4 (four) times daily.  . Cholecalciferol (VITAMIN D-3) 1000 UNITS CAPS Take 1 capsule by mouth daily.  . fish oil-omega-3 fatty acids 1000 MG capsule Take 1 g by mouth 2 (two) times daily.   . furosemide (LASIX) 40 MG tablet Take 1 tablet by mouth daily, take 1 extra pill daily as needed for weight increase of 3 lbs in a day or 5 lbs in a week (Patient taking differently: Take 40 mg by mouth daily.  )  . gabapentin (NEURONTIN) 300 MG capsule Take 300 mg by mouth daily.   . Garlic 924 MG TABS Take 300 mg by mouth daily.  Marland Kitchen ipratropium-albuterol (DUONEB) 0.5-2.5 (3) MG/3ML SOLN Inhale 3 mLs into the lungs 4 (four) times daily.   Marland Kitchen LANTUS SOLOSTAR 100 UNIT/ML Solostar Pen Inject 15 Units into the skin every morning.   . linagliptin (TRADJENTA) 5 MG TABS tablet Take 5 mg by mouth daily.  Marland Kitchen loratadine (CLARITIN) 10 MG tablet Take 10 mg by mouth daily.  Marland Kitchen losartan (COZAAR) 50 MG tablet Take 50 mg by mouth daily.  . meclizine (ANTIVERT) 25 MG tablet Take 25 mg by mouth daily.   . metoprolol succinate (TOPROL-XL) 25 MG 24 hr tablet Take 1 tablet (25 mg total) by mouth daily.  . nitroGLYCERIN (NITROSTAT) 0.4 MG SL tablet Place 0.4 mg under the tongue every 5 (five) minutes as needed for chest pain.  Marland Kitchen omeprazole (PRILOSEC) 20 MG capsule Take 1 capsule (20 mg total) by mouth daily.  . OXYGEN Inhale 3 L into the lungs continuous.  . potassium chloride (K-DUR) 10 MEQ tablet Take 1 tablet (10 mEq total) by mouth daily.  Marland Kitchen pyridoxine (B-6) 100 MG tablet Take 100 mg by mouth daily. Reported on 05/28/2016  . rOPINIRole (REQUIP) 0.25 MG tablet Take 0.25 mg by mouth 3 (three) times daily.   . sodium chloride (OCEAN) 0.65 % SOLN nasal spray Place 1 spray into both nostrils as needed for congestion.  . sulfamethoxazole-trimethoprim (BACTRIM DS,SEPTRA DS) 800-160 MG tablet Take 1 tablet by mouth 2 (two) times daily.  . traMADol (ULTRAM) 50 MG tablet Take 1 tablet by mouth every 4 (four) hours as needed for moderate pain.    No facility-administered encounter medications on file as of 03/26/2017.     ALLERGIES:  Allergies  Allergen Reactions  . Lisinopril Cough     PHYSICAL EXAM:  ECOG Performance status: ***  There were no vitals filed for this visit. There were no vitals filed for this visit.  Physical Exam   LABORATORY DATA:  I have reviewed the labs as listed.  CBC    Component Value  Date/Time   WBC 7.4 03/22/2017 1235   RBC 4.00 03/22/2017 1235   HGB 12.1 03/22/2017 1235   HCT 38.4 03/22/2017 1235   PLT 212 03/22/2017 1235   MCV 96.0 03/22/2017 1235   MCH 30.3 03/22/2017  1235   MCHC 31.5 03/22/2017 1235   RDW 14.7 03/22/2017 1235   LYMPHSABS 1.9 03/22/2017 1235   MONOABS 0.8 03/22/2017 1235   EOSABS 0.3 03/22/2017 1235   BASOSABS 0.0 03/22/2017 1235   CMP Latest Ref Rng & Units 03/22/2017 01/13/2017 01/11/2017  Glucose 65 - 99 mg/dL 177(H) 181(H) 225(H)  BUN 6 - 20 mg/dL 33(H) 43(H) 25(H)  Creatinine 0.44 - 1.00 mg/dL 1.74(H) 1.71(H) 1.55(H)  Sodium 135 - 145 mmol/L 141 134(L) 136  Potassium 3.5 - 5.1 mmol/L 5.1 4.9 5.1  Chloride 101 - 111 mmol/L 101 99(L) 100(L)  CO2 22 - 32 mmol/L '31 28 25  '$ Calcium 8.9 - 10.3 mg/dL 9.8 9.7 9.7  Total Protein 6.5 - 8.1 g/dL - - -  Total Bilirubin 0.3 - 1.2 mg/dL - - -  Alkaline Phos 38 - 126 U/L - - -  AST 15 - 41 U/L - - -  ALT 14 - 54 U/L - - -    PENDING LABS:    DIAGNOSTIC IMAGING:  ***  PATHOLOGY:  ***   ASSESSMENT & PLAN:         Dispo:  -   All questions were answered to patient's stated satisfaction. Encouraged patient to call with any new concerns or questions before her next visit to the cancer center and we can certain see her sooner, if needed.    Plan of care discussed with Dr. ***, who agrees with the above aforementioned.    Orders placed this encounter:  No orders of the defined types were placed in this encounter.     Mike Craze, NP [place] [phone]

## 2017-03-27 ENCOUNTER — Other Ambulatory Visit (HOSPITAL_COMMUNITY): Payer: Self-pay | Admitting: Adult Health

## 2017-03-27 DIAGNOSIS — C3490 Malignant neoplasm of unspecified part of unspecified bronchus or lung: Secondary | ICD-10-CM

## 2017-04-01 DIAGNOSIS — T8189XA Other complications of procedures, not elsewhere classified, initial encounter: Secondary | ICD-10-CM | POA: Diagnosis not present

## 2017-04-04 ENCOUNTER — Observation Stay (HOSPITAL_COMMUNITY)
Admission: EM | Admit: 2017-04-04 | Discharge: 2017-04-06 | Disposition: A | Discharge status: Home / Self Care | Payer: Medicare Other | Attending: Internal Medicine | Admitting: Internal Medicine

## 2017-04-04 ENCOUNTER — Emergency Department (HOSPITAL_COMMUNITY): Payer: Medicare Other

## 2017-04-04 ENCOUNTER — Encounter (HOSPITAL_COMMUNITY): Payer: Self-pay

## 2017-04-04 DIAGNOSIS — N189 Chronic kidney disease, unspecified: Secondary | ICD-10-CM

## 2017-04-04 DIAGNOSIS — E1122 Type 2 diabetes mellitus with diabetic chronic kidney disease: Secondary | ICD-10-CM

## 2017-04-04 DIAGNOSIS — Z794 Long term (current) use of insulin: Secondary | ICD-10-CM

## 2017-04-04 DIAGNOSIS — I509 Heart failure, unspecified: Secondary | ICD-10-CM | POA: Diagnosis not present

## 2017-04-04 DIAGNOSIS — I10 Essential (primary) hypertension: Secondary | ICD-10-CM | POA: Diagnosis not present

## 2017-04-04 DIAGNOSIS — I130 Hypertensive heart and chronic kidney disease with heart failure and stage 1 through stage 4 chronic kidney disease, or unspecified chronic kidney disease: Secondary | ICD-10-CM | POA: Insufficient documentation

## 2017-04-04 DIAGNOSIS — E119 Type 2 diabetes mellitus without complications: Secondary | ICD-10-CM

## 2017-04-04 DIAGNOSIS — G20 Parkinson's disease: Secondary | ICD-10-CM | POA: Insufficient documentation

## 2017-04-04 DIAGNOSIS — J449 Chronic obstructive pulmonary disease, unspecified: Secondary | ICD-10-CM | POA: Insufficient documentation

## 2017-04-04 DIAGNOSIS — Z87891 Personal history of nicotine dependence: Secondary | ICD-10-CM | POA: Diagnosis not present

## 2017-04-04 DIAGNOSIS — N183 Chronic kidney disease, stage 3 (moderate): Secondary | ICD-10-CM | POA: Insufficient documentation

## 2017-04-04 DIAGNOSIS — R112 Nausea with vomiting, unspecified: Secondary | ICD-10-CM | POA: Diagnosis not present

## 2017-04-04 DIAGNOSIS — Z79899 Other long term (current) drug therapy: Secondary | ICD-10-CM | POA: Insufficient documentation

## 2017-04-04 DIAGNOSIS — R072 Precordial pain: Secondary | ICD-10-CM | POA: Diagnosis present

## 2017-04-04 DIAGNOSIS — R1013 Epigastric pain: Secondary | ICD-10-CM | POA: Diagnosis not present

## 2017-04-04 DIAGNOSIS — R079 Chest pain, unspecified: Secondary | ICD-10-CM | POA: Diagnosis not present

## 2017-04-04 DIAGNOSIS — C3490 Malignant neoplasm of unspecified part of unspecified bronchus or lung: Principal | ICD-10-CM | POA: Insufficient documentation

## 2017-04-04 DIAGNOSIS — N179 Acute kidney failure, unspecified: Secondary | ICD-10-CM | POA: Diagnosis present

## 2017-04-04 DIAGNOSIS — I5032 Chronic diastolic (congestive) heart failure: Secondary | ICD-10-CM

## 2017-04-04 DIAGNOSIS — Z7982 Long term (current) use of aspirin: Secondary | ICD-10-CM | POA: Diagnosis not present

## 2017-04-04 LAB — CBC WITH DIFFERENTIAL/PLATELET
Basophils Absolute: 0 10*3/uL (ref 0.0–0.1)
Basophils Relative: 0 %
Eosinophils Absolute: 0.3 10*3/uL (ref 0.0–0.7)
Eosinophils Relative: 4 %
HCT: 39.8 % (ref 36.0–46.0)
Hemoglobin: 12.5 g/dL (ref 12.0–15.0)
Lymphocytes Relative: 23 %
Lymphs Abs: 1.9 10*3/uL (ref 0.7–4.0)
MCH: 30.2 pg (ref 26.0–34.0)
MCHC: 31.4 g/dL (ref 30.0–36.0)
MCV: 96.1 fL (ref 78.0–100.0)
Monocytes Absolute: 0.9 10*3/uL (ref 0.1–1.0)
Monocytes Relative: 11 %
Neutro Abs: 5.1 10*3/uL (ref 1.7–7.7)
Neutrophils Relative %: 62 %
Platelets: 251 10*3/uL (ref 150–400)
RBC: 4.14 MIL/uL (ref 3.87–5.11)
RDW: 15.1 % (ref 11.5–15.5)
WBC: 8.2 10*3/uL (ref 4.0–10.5)

## 2017-04-04 LAB — GLUCOSE, CAPILLARY: Glucose-Capillary: 89 mg/dL (ref 65–99)

## 2017-04-04 LAB — COMPREHENSIVE METABOLIC PANEL
ALT: 28 U/L (ref 14–54)
AST: 30 U/L (ref 15–41)
Albumin: 4.2 g/dL (ref 3.5–5.0)
Alkaline Phosphatase: 56 U/L (ref 38–126)
Anion gap: 8 (ref 5–15)
BUN: 44 mg/dL — ABNORMAL HIGH (ref 6–20)
CO2: 32 mmol/L (ref 22–32)
Calcium: 11.7 mg/dL — ABNORMAL HIGH (ref 8.9–10.3)
Chloride: 99 mmol/L — ABNORMAL LOW (ref 101–111)
Creatinine, Ser: 2.94 mg/dL — ABNORMAL HIGH (ref 0.44–1.00)
GFR calc Af Amer: 16 mL/min — ABNORMAL LOW (ref >60)
GFR calc non Af Amer: 14 mL/min — ABNORMAL LOW (ref >60)
Glucose, Bld: 117 mg/dL — ABNORMAL HIGH (ref 65–99)
Potassium: 5.1 mmol/L (ref 3.5–5.1)
Sodium: 139 mmol/L (ref 135–145)
Total Bilirubin: 0.4 mg/dL (ref 0.3–1.2)
Total Protein: 7.9 g/dL (ref 6.5–8.1)

## 2017-04-04 LAB — TROPONIN I
Troponin I: <0.03 ng/mL (ref <0.03)
Troponin I: <0.03 ng/mL (ref <0.03)

## 2017-04-04 LAB — LIPASE, BLOOD: Lipase: 48 U/L (ref 11–51)

## 2017-04-04 LAB — BRAIN NATRIURETIC PEPTIDE: B Natriuretic Peptide: 9 pg/mL (ref 0.0–100.0)

## 2017-04-04 MED ORDER — ACETAMINOPHEN 325 MG PO TABS: 650.0000 mg | ORAL_TABLET | ORAL | Status: DC | PRN

## 2017-04-04 MED ORDER — LACTATED RINGERS IV BOLUS (SEPSIS)
500.0000 mL | Freq: Once | INTRAVENOUS | Status: AC
  Administered 2017-04-04: 500 mL via INTRAVENOUS

## 2017-04-04 MED ORDER — ROPINIROLE HCL 0.25 MG PO TABS
0.2500 mg | ORAL_TABLET | Freq: Three times a day (TID) | ORAL | Status: DC
  Administered 2017-04-04 – 2017-04-06 (×5): 0.25 mg via ORAL
  Filled 2017-04-04 (×5): qty 1

## 2017-04-04 MED ORDER — INSULIN ASPART 100 UNIT/ML ~~LOC~~ SOLN: 0.0000 [IU] | Freq: Three times a day (TID) | SUBCUTANEOUS | Status: DC

## 2017-04-04 MED ORDER — IPRATROPIUM-ALBUTEROL 0.5-2.5 (3) MG/3ML IN SOLN
3.0000 mL | Freq: Once | RESPIRATORY_TRACT | Status: AC
  Administered 2017-04-04: 3 mL via RESPIRATORY_TRACT
  Filled 2017-04-04: qty 3

## 2017-04-04 MED ORDER — ATORVASTATIN CALCIUM 40 MG PO TABS
40.0000 mg | ORAL_TABLET | Freq: Every evening | ORAL | Status: DC
  Administered 2017-04-04 – 2017-04-05 (×2): 40 mg via ORAL
  Filled 2017-04-04 (×2): qty 1

## 2017-04-04 MED ORDER — ONDANSETRON HCL 4 MG/2ML IJ SOLN
4.0000 mg | Freq: Four times a day (QID) | INTRAMUSCULAR | Status: DC | PRN
  Administered 2017-04-06: 4 mg via INTRAVENOUS

## 2017-04-04 MED ORDER — POTASSIUM CHLORIDE ER 10 MEQ PO TBCR
10.0000 meq | EXTENDED_RELEASE_TABLET | Freq: Every day | ORAL | Status: DC
  Filled 2017-04-04 (×2): qty 1

## 2017-04-04 MED ORDER — MECLIZINE HCL 12.5 MG PO TABS
25.0000 mg | ORAL_TABLET | Freq: Every day | ORAL | Status: DC
  Administered 2017-04-05 – 2017-04-06 (×2): 25 mg via ORAL
  Filled 2017-04-04 (×2): qty 2

## 2017-04-04 MED ORDER — TRAMADOL HCL 50 MG PO TABS
50.0000 mg | ORAL_TABLET | Freq: Two times a day (BID) | ORAL | Status: DC | PRN
  Administered 2017-04-05: 50 mg via ORAL
  Filled 2017-04-04: qty 1

## 2017-04-04 MED ORDER — LORATADINE 10 MG PO TABS
10.0000 mg | ORAL_TABLET | Freq: Every day | ORAL | Status: DC
  Administered 2017-04-05 – 2017-04-06 (×2): 10 mg via ORAL
  Filled 2017-04-04 (×2): qty 1

## 2017-04-04 MED ORDER — VITAMIN B-6 50 MG PO TABS
100.0000 mg | ORAL_TABLET | Freq: Every day | ORAL | Status: DC
  Administered 2017-04-05 – 2017-04-06 (×2): 100 mg via ORAL
  Filled 2017-04-04 (×2): qty 2

## 2017-04-04 MED ORDER — METOPROLOL SUCCINATE ER 25 MG PO TB24
25.0000 mg | ORAL_TABLET | Freq: Every day | ORAL | Status: DC
  Administered 2017-04-05 – 2017-04-06 (×2): 25 mg via ORAL
  Filled 2017-04-04 (×2): qty 1

## 2017-04-04 MED ORDER — LACTATED RINGERS IV BOLUS (SEPSIS)
1000.0000 mL | Freq: Once | INTRAVENOUS | Status: AC
  Administered 2017-04-04: 1000 mL via INTRAVENOUS

## 2017-04-04 MED ORDER — INSULIN ASPART 100 UNIT/ML ~~LOC~~ SOLN: 0.0000 [IU] | Freq: Every day | SUBCUTANEOUS | Status: DC

## 2017-04-04 MED ORDER — LOSARTAN POTASSIUM 50 MG PO TABS
50.0000 mg | ORAL_TABLET | Freq: Every day | ORAL | Status: DC
  Administered 2017-04-05 – 2017-04-06 (×2): 50 mg via ORAL
  Filled 2017-04-04 (×2): qty 1

## 2017-04-04 MED ORDER — ASPIRIN 81 MG PO CHEW
324.0000 mg | CHEWABLE_TABLET | Freq: Once | ORAL | Status: AC
  Administered 2017-04-04: 324 mg via ORAL
  Filled 2017-04-04: qty 4

## 2017-04-04 MED ORDER — NITROGLYCERIN 0.4 MG SL SUBL: 0.4000 mg | SUBLINGUAL_TABLET | SUBLINGUAL | Status: DC | PRN

## 2017-04-04 MED ORDER — GABAPENTIN 300 MG PO CAPS
600.0000 mg | ORAL_CAPSULE | Freq: Every day | ORAL | Status: DC
  Administered 2017-04-04 – 2017-04-05 (×2): 600 mg via ORAL
  Filled 2017-04-04 (×2): qty 2

## 2017-04-04 MED ORDER — INSULIN GLARGINE 100 UNIT/ML ~~LOC~~ SOLN
15.0000 [IU] | Freq: Every day | SUBCUTANEOUS | Status: DC
  Administered 2017-04-04 – 2017-04-05 (×2): 15 [IU] via SUBCUTANEOUS
  Filled 2017-04-04 (×3): qty 0.15

## 2017-04-04 MED ORDER — ALBUTEROL SULFATE (2.5 MG/3ML) 0.083% IN NEBU
3.0000 mL | INHALATION_SOLUTION | RESPIRATORY_TRACT | Status: DC | PRN
  Administered 2017-04-05 (×2): 3 mL via RESPIRATORY_TRACT
  Filled 2017-04-04 (×2): qty 3

## 2017-04-04 MED ORDER — ENOXAPARIN SODIUM 30 MG/0.3ML ~~LOC~~ SOLN
30.0000 mg | SUBCUTANEOUS | Status: DC
Start: 2017-04-04 — End: 2017-04-06
  Administered 2017-04-04 – 2017-04-05 (×2): 30 mg via SUBCUTANEOUS
  Filled 2017-04-04 (×2): qty 0.3

## 2017-04-04 MED ORDER — ONDANSETRON HCL 4 MG PO TABS
4.0000 mg | ORAL_TABLET | Freq: Four times a day (QID) | ORAL | Status: DC | PRN
  Administered 2017-04-04: 4 mg via ORAL
  Filled 2017-04-04: qty 1

## 2017-04-04 MED ORDER — BUPROPION HCL ER (SR) 150 MG PO TB12
150.0000 mg | ORAL_TABLET | Freq: Two times a day (BID) | ORAL | Status: DC
  Administered 2017-04-04 – 2017-04-06 (×4): 150 mg via ORAL
  Filled 2017-04-04 (×4): qty 1

## 2017-04-04 MED ORDER — SALINE SPRAY 0.65 % NA SOLN: 1.0000 | NASAL | Status: DC | PRN

## 2017-04-04 MED ORDER — ONDANSETRON HCL 4 MG/2ML IJ SOLN
4.0000 mg | Freq: Four times a day (QID) | INTRAMUSCULAR | Status: DC | PRN
  Filled 2017-04-04: qty 2

## 2017-04-04 MED ORDER — PANTOPRAZOLE SODIUM 40 MG PO TBEC
40.0000 mg | DELAYED_RELEASE_TABLET | Freq: Every day | ORAL | Status: DC
  Administered 2017-04-05 – 2017-04-06 (×2): 40 mg via ORAL
  Filled 2017-04-04 (×2): qty 1

## 2017-04-04 MED ORDER — LACTATED RINGERS IV SOLN
INTRAVENOUS | Status: DC
  Administered 2017-04-04: 22:00:00 via INTRAVENOUS

## 2017-04-04 MED ORDER — ASPIRIN EC 81 MG PO TBEC
81.0000 mg | DELAYED_RELEASE_TABLET | Freq: Every day | ORAL | Status: DC
  Administered 2017-04-05 – 2017-04-06 (×2): 81 mg via ORAL
  Filled 2017-04-04 (×2): qty 1

## 2017-04-04 NOTE — ED Provider Notes (Signed)
[facility] DEPT Provider Note   CSN: [id number] Arrival date & time: 04/04/17  1520     History   Chief Complaint Chief Complaint  Patient presents with  . Nausea    HPI Angela Lawson is a 83 y.o. female.  HPI Pt comes in with cc of nausea, chest pain. PT has hx of COPD, CHF and lung CA. She reports that for the last 2 days she has had intermittent chest pain in the substernal chest region that is 5/10, non radiating and  Pressure like. Pt has no associated new cough, dib, wheezing. Pt's pain is not worse with inspiration. Pt has no hx of similar pain. Pt also reports that she has lost appetite over the last 3 days and had nausea with emesis x 1 earlier today. No hx of PE, DVT.   Past Medical History:  Diagnosis Date  . Adenocarcinoma of lung ([place])    Left lung 2009, resected  . Arthritis   . Back pain   . CHF (congestive heart failure) ([place])   . COPD (chronic obstructive pulmonary disease) ([place])   . Diverticulitis   . Dyslipidemia   . Essential hypertension   . H/O ventral hernia   . Non-obstructive CAD    a. 04/2015 NSTEMI/Cath: LAD 10p, LCX 81m RCA 366m20d, EF 35-40 w/ apical ballooning.  . On home O2    3L N/C  . Osteoporosis   . Osteoporosis 11/04/2015   Managed by Dr. FaLegrand Rams . Parkinson's disease ([place]  . Shortness of breath   . Takotsubo cardiomyopathy    a. 04/2015 Echo: EF 45-50%, mid-dist anterior/apical/inferoapical HK w/ hyperdynamic base. Gr 1 DD, mild AI, mild-mod MR, triv TR, PASP 4857m;  b. 04/2015 LV gram: Ef 35-40% w/ apical ballooning.  . Type II diabetes mellitus ([facility]CT[place] . Ventricular bigeminy    a. 04/2015 in setting of NSTEMI/Takotsubo.    Patient Active Problem List   Diagnosis Date Noted  . Acute renal injury ([place]CC[place]4/28/2018  . Chest pain 04/04/2017  . Nausea and vomiting 04/04/2017  . COPD with acute exacerbation (HCC[place] Valley Farms5/19/2017  . Acute on chronic respiratory failure with hypoxia ([place]CC[place]5/19/2017  . SOB (shortness of breath)     . Osteoporosis 11/04/2015  . CHF (congestive heart failure) (HCC[place]8/24/2016  . CHF exacerbation (HCC[place] Pitt[place]8/12/2014  . Essential hypertension 04/19/2015  . Type II diabetes mellitus ([place]CC[place]5/11/2015  . Dyslipidemia 04/19/2015  . History of lung cancer 04/19/2015  . Takotsubo cardiomyopathy 04/19/2015  . Ventricular bigeminy 04/19/2015  . NSTEMI (non-ST elevated myocardial infarction) ([place]CC[place]5/11/2015  . Stress-induced cardiomyopathy 04/18/2015  . Elevated troponin 04/16/2015  . COPD exacerbation (HCCWood[place]5/08/2015  . CKD (chronic kidney disease) stage 3, GFR 30-59 ml/min 04/16/2015  . Herpes genitalis in women 11/24/2014  . Toe fracture 09/28/2014  . Non-traumatic tear of right rotator cuff 11/30/2013  . Pain in joint, shoulder region 11/30/2013  . Muscle weakness (generalized) 11/30/2013  . Bursitis of left shoulder 02/09/2013  . Adenocarcinoma of lung (HCCKissee Mills9/13/2013    Past Surgical History:  Procedure Laterality Date  . ABDOMINAL HYSTERECTOMY    . CARDIAC CATHETERIZATION N/A 04/17/2015   Procedure: Left Heart Cath and Coronary Angiography;  Surgeon: ThoTroy SineD;  Location: [facility];  Service: Cardiovascular;  Laterality: N/A;  . CHOLECYSTECTOMY    . COLONOSCOPY N/A 09/18/2014   Procedure: COLONOSCOPY;  Surgeon: SanDanie BinderD;  Location: [facility] SUITE;  Service: Endoscopy;  Laterality: N/A;  8:30 AM - moved to 10:30 Rosendo Gros to notify pt  . ECTOPIC PREGNANCY SURGERY    . INCISIONAL HERNIA REPAIR N/A 08/26/2013   Procedure: HERNIA REPAIR INCISIONAL WITH MESH;  Surgeon: Jamesetta So, MD;  Location: [facility] ORS;  Service: General;  Laterality: N/A;  . LUNG CANCER SURGERY      OB History    No data available       Home Medications    Prior to Admission medications   Medication Sig Start Date End Date Taking? Authorizing Provider  albuterol (PROAIR HFA) 108 (90 BASE) MCG/ACT inhaler Inhale 2 puffs into the lungs every 4 (four) hours as needed for wheezing  or shortness of breath.   Yes Historical Provider, MD  alendronate (FOSAMAX) 70 MG tablet Take 70 mg by mouth every Friday.    Yes Historical Provider, MD  aspirin EC 81 MG tablet Take 81 mg by mouth daily.   Yes Historical Provider, MD  atorvastatin (LIPITOR) 40 MG tablet Take 40 mg by mouth every evening.   Yes Historical Provider, MD  benzonatate (TESSALON) 100 MG capsule Take 100 mg by mouth 3 (three) times daily as needed for cough.   Yes Historical Provider, MD  buPROPion (WELLBUTRIN SR) 150 MG 12 hr tablet Take 150 mg by mouth 2 (two) times daily.   Yes Historical Provider, MD  cholecalciferol (VITAMIN D) 1000 units tablet Take 1,000 Units by mouth daily.   Yes Historical Provider, MD  furosemide (LASIX) 40 MG tablet Take 1 tablet by mouth daily, take 1 extra pill daily as needed for weight increase of 3 lbs in a day or 5 lbs in a week Patient taking differently: Take 40 mg by mouth daily. Pt takes one extra tablet daily for weight gain of 3lbs in one day or 5lbs in one week. 05/21/16  Yes Isaiah Serge, NP  gabapentin (NEURONTIN) 300 MG capsule Take 600 mg by mouth at bedtime.    Yes Historical Provider, MD  Garlic 629 MG TABS Take 300 mg by mouth daily.   Yes Historical Provider, MD  ipratropium-albuterol (DUONEB) 0.5-2.5 (3) MG/3ML SOLN Inhale 3 mLs into the lungs every 6 (six) hours as needed (for wheezing/shortness of breath).    Yes Historical Provider, MD  LANTUS SOLOSTAR 100 UNIT/ML Solostar Pen Inject 15 Units into the skin at bedtime.    Yes Historical Provider, MD  linagliptin (TRADJENTA) 5 MG TABS tablet Take 5 mg by mouth daily.   Yes Historical Provider, MD  loratadine (CLARITIN) 10 MG tablet Take 10 mg by mouth daily.   Yes Historical Provider, MD  losartan (COZAAR) 50 MG tablet Take 50 mg by mouth daily.   Yes Historical Provider, MD  meclizine (ANTIVERT) 25 MG tablet Take 25 mg by mouth daily.    Yes Historical Provider, MD  metoprolol succinate (TOPROL-XL) 25 MG 24 hr tablet  Take 1 tablet (25 mg total) by mouth daily. 06/26/15  Yes Jettie Booze, MD  nitroGLYCERIN (NITROSTAT) 0.4 MG SL tablet Place 0.4 mg under the tongue every 5 (five) minutes as needed for chest pain.   Yes Historical Provider, MD  omega-3 acid ethyl esters (LOVAZA) 1 g capsule Take 1 g by mouth 2 (two) times daily.   Yes Historical Provider, MD  omeprazole (PRILOSEC) 20 MG capsule Take 1 capsule (20 mg total) by mouth daily. 10/01/16  Yes Jettie Booze, MD  potassium chloride (K-DUR) 10 MEQ tablet Take 1 tablet (10 mEq total)  by mouth daily. 05/21/16  Yes Isaiah Serge, NP  pyridoxine (B-6) 100 MG tablet Take 100 mg by mouth daily.    Yes Historical Provider, MD  rOPINIRole (REQUIP) 0.25 MG tablet Take 0.25 mg by mouth 3 (three) times daily.    Yes Historical Provider, MD  sodium chloride (OCEAN) 0.65 % SOLN nasal spray Place 1 spray into both nostrils every 3 (three) hours as needed for congestion.    Yes Historical Provider, MD  traMADol (ULTRAM) 50 MG tablet Take 50 mg by mouth every 12 (twelve) hours as needed for moderate pain.    Yes Historical Provider, MD    Family History Family History  Problem Relation Age of Onset  . Diabetes Mother   . Hypertension Mother   . Diabetes Father   . Asthma    . Cancer    . Heart attack Sister     X2  . Colon cancer Neg Hx     Social History Social History  Substance Use Topics  . Smoking status: Former Smoker    Years: 20.00    Types: Cigarettes    Quit date: 08/13/2006  . Smokeless tobacco: Never Used  . Alcohol use No     Allergies   Lisinopril   Review of Systems Review of Systems  Cardiovascular: Positive for chest pain.  Gastrointestinal: Positive for nausea and vomiting. Negative for abdominal distention, abdominal pain and blood in stool.  All other systems reviewed and are negative.    Physical Exam Updated Vital Signs BP (!) 123/45 (BP Location: Right Arm)   Pulse 72   Temp 98.9 F (37.2 C) (Oral)   Resp  16   Ht '5\' 4"'$  (1.626 m)   Wt 178 lb 9.2 oz (81 kg)   SpO2 99%   BMI 30.65 kg/m   Physical Exam  Constitutional: She is oriented to person, place, and time. She appears well-developed and well-nourished.  HENT:  Head: Normocephalic and atraumatic.  Eyes: EOM are normal. Pupils are equal, round, and reactive to light.  Neck: Neck supple.  Cardiovascular: Normal rate, regular rhythm and normal heart sounds.   No murmur heard. Pulmonary/Chest: Effort normal. No respiratory distress. She has wheezes.  Abdominal: Soft. She exhibits no distension. There is no tenderness. There is no rebound and no guarding.  Neurological: She is alert and oriented to person, place, and time.  Skin: Skin is warm and dry.  Nursing note and vitals reviewed.    ED Treatments / Results  Labs (all labs ordered are listed, but only abnormal results are displayed) Labs Reviewed  COMPREHENSIVE METABOLIC PANEL - Abnormal; Notable for the following:       Result Value   Chloride 99 (*)    Glucose, Bld 117 (*)    BUN 44 (*)    Creatinine, Ser 2.94 (*)    Calcium 11.7 (*)    GFR calc non Af Amer 14 (*)    GFR calc Af Amer 16 (*)    All other components within normal limits  BASIC METABOLIC PANEL - Abnormal; Notable for the following:    Chloride 98 (*)    BUN 36 (*)    Creatinine, Ser 2.19 (*)    GFR calc non Af Amer 20 (*)    GFR calc Af Amer 23 (*)    All other components within normal limits  GLUCOSE, CAPILLARY - Abnormal; Notable for the following:    Glucose-Capillary 141 (*)    All other components within normal  limits  GLUCOSE, CAPILLARY - Abnormal; Notable for the following:    Glucose-Capillary 111 (*)    All other components within normal limits  LIPASE, BLOOD  BRAIN NATRIURETIC PEPTIDE  CBC WITH DIFFERENTIAL/PLATELET  TROPONIN I  TROPONIN I  TROPONIN I  GLUCOSE, CAPILLARY  TROPONIN I  GLUCOSE, CAPILLARY  BASIC METABOLIC PANEL    EKG  EKG Interpretation  Date/Time:  Saturday  April 04 2017 15:31:18 EDT Ventricular Rate:  74 PR Interval:    QRS Duration: 84 QT Interval:  355 QTC Calculation: 394 R Axis:   42 Text Interpretation:  Sinus rhythm Anteroseptal infarct, age indeterminate ST elevation, consider inferior injury Lateral leads are also involved No acute changes No significant change since last tracing Confirmed by Kathrynn Humble, MD, Thelma Comp [phone]) on 04/04/2017 4:12:58 PM       Radiology Dg Chest 2 View  Result Date: 04/04/2017 CLINICAL DATA:  Nausea vomiting.  Epigastric pain. EXAM: CHEST  2 VIEW COMPARISON:  PET-CT 03/25/2017 FINDINGS: Cardiomediastinal silhouette is normal. Mediastinal contours appear intact. Tortuosity and calcific atherosclerotic disease of the aorta. There is no evidence of pneumothorax. There is a left lower lobe airspace consolidation. Previously demonstrated by CT central peribronchial masses in the right lower lobe projects as nodular and linear opacities. Mild emphysematous changes. Osseous structures are without acute abnormality. Soft tissues are grossly normal. IMPRESSION: Left lower lobe airspace consolidation, which likely represents the ground-glass opacity seen by recent CT. Known right lower lobe central peribronchial masses. No evidence of new focal airspace consolidation. Atherosclerotic disease of the aorta. Electronically Signed   By: Fidela Salisbury M.D.   On: 04/04/2017 16:56    Procedures Procedures (including critical care time)  Medications Ordered in ED Medications  buPROPion [facility] SR) 12 hr tablet 150 mg (150 mg Oral Given 04/05/17 0904)  gabapentin (NEURONTIN) capsule 600 mg (600 mg Oral Given 04/04/17 2228)  albuterol (PROVENTIL) (2.5 MG/3ML) 0.083% nebulizer solution 3 mL (3 mLs Inhalation Given 04/05/17 0816)  rOPINIRole (REQUIP) tablet 0.25 mg (0.25 mg Oral Given 04/05/17 1651)  aspirin EC tablet 81 mg (81 mg Oral Given 04/05/17 0904)  metoprolol succinate (TOPROL-XL) 24 hr tablet 25 mg (25 mg Oral Given  04/05/17 0904)  losartan (COZAAR) tablet 50 mg (50 mg Oral Given 04/05/17 0905)  pyridOXINE (VITAMIN B-6) tablet 100 mg (100 mg Oral Given 04/05/17 0904)  nitroGLYCERIN (NITROSTAT) SL tablet 0.4 mg (not administered)  sodium chloride (OCEAN) 0.65 % nasal spray 1 spray (not administered)  loratadine (CLARITIN) tablet 10 mg (10 mg Oral Given 04/05/17 0904)  insulin glargine (LANTUS) injection 15 Units (15 Units Subcutaneous Given 04/04/17 2228)  traMADol (ULTRAM) tablet 50 mg (50 mg Oral Given 04/05/17 1105)  meclizine (ANTIVERT) tablet 25 mg (25 mg Oral Given 04/05/17 0905)  pantoprazole (PROTONIX) EC tablet 40 mg (40 mg Oral Given 04/05/17 0904)  atorvastatin (LIPITOR) tablet 40 mg (40 mg Oral Given 04/05/17 1651)  acetaminophen (TYLENOL) tablet 650 mg (not administered)  ondansetron (ZOFRAN) injection 4 mg (not administered)  enoxaparin (LOVENOX) injection 30 mg (30 mg Subcutaneous Given 04/04/17 2228)  ondansetron (ZOFRAN) injection 4 mg ( Intravenous See Alternative 04/04/17 2238)    Or  ondansetron (ZOFRAN) tablet 4 mg (4 mg Oral Given 04/04/17 2238)  lactated ringers infusion ( Intravenous Stopped 04/05/17 1121)  insulin aspart (novoLOG) injection 0-15 Units (0 Units Subcutaneous Not Given 04/05/17 1700)  insulin aspart (novoLOG) injection 0-5 Units (0 Units Subcutaneous Not Given 04/04/17 2200)  MEDLINE mouth rinse (15 mLs Mouth Rinse  Not Given 04/05/17 1000)  potassium chloride (K-DUR,KLOR-CON) CR tablet 10 mEq (10 mEq Oral Given 04/05/17 0904)  0.9 %  sodium chloride infusion ( Intravenous New Bag/Given 04/05/17 1121)  alum & mag hydroxide-simeth (MAALOX/MYLANTA) 200-200-20 MG/5ML suspension 15 mL (15 mLs Oral Given 04/05/17 1224)  aspirin chewable tablet 324 mg (324 mg Oral Given 04/04/17 1624)  lactated ringers bolus 500 mL (0 mLs Intravenous Stopped 04/04/17 1746)  ipratropium-albuterol (DUONEB) 0.5-2.5 (3) MG/3ML nebulizer solution 3 mL (3 mLs Nebulization Given 04/04/17 1651)  lactated ringers  bolus 1,000 mL (0 mLs Intravenous Stopped 04/04/17 2020)     Initial Impression / Assessment and Plan / ED Course  I have reviewed the triage vital signs and the nursing notes.  Pertinent labs & imaging results that were available during my care of the patient were reviewed by me and considered in my medical decision making (see chart for details).  Clinical Course as of Apr 05 1657  Sat Apr 04, 2017  1759 04/2015 cath:  Prox LAD lesion, 10% stenosed.  Mid Cx lesion, 20% stenosed.  Mid RCA lesion, 30% stenosed.  Dist RCA lesion, 20% stenosed.  [AN]    Clinical Course User Index [AN] Varney Biles, MD    Pt comes in with cc of nausea, chest discomfort. She has hx of CHF, COPD, stage 4 lung CA. Pt is wheezing slightly on exam. No resp distress.   Differential diagnosis includes: ACS syndrome CHF exacerbation Myocarditis Pericarditis Pericardial effusion / tamponade Pneumonia Pleural effusion / Pulmonary edema PE Pneumothorax Musculoskeletal pain PUD / Gastritis / Esophagitis Esophageal spasm  Based on the hx and exam, the chest pain is very atypical. HEAR score is 5 however, for age, risk factors and ekg.  Trops x 2 ordered. Basic labs ordered. We will reassess.  Final Clinical Impressions(s) / ED Diagnoses   Final diagnoses:  Acute renal failure superimposed on chronic kidney disease, unspecified CKD stage, unspecified acute renal failure type ([place])  Malignant neoplasm of lung, unspecified laterality, unspecified part of lung [facility])    New Prescriptions Current Discharge Medication List       Varney Biles, MD 04/05/17 1658

## 2017-04-04 NOTE — ED Notes (Signed)
Hospitalist at bedside 

## 2017-04-04 NOTE — ED Notes (Signed)
Patient transported to X-ray 

## 2017-04-04 NOTE — ED Triage Notes (Signed)
Pt brought in by EMS due to N/V. Pt reports vomiting x1 approx 1200. No diarrhea. Reports pressure in epigastric area.

## 2017-04-04 NOTE — H&P (Signed)
History and Physical  [name] QMV:[id number] DOB: 1934-06-03 DOA: 04/04/2017  Referring physician: Dr Kathrynn Humble, ED physician PCP: Rosita Fire, MD  Outpatient Specialists:    Patient Coming From: Home  Chief Complaint: Intermittent chest pain, nausea, vomiting  HPI: Angela Lawson is a 83 y.o. female with a history of adenocarcinoma of left lung in 2009 status post resection, grade 1 diastolic CHF with normal EF on echocardiogram 2016, COPD, nonobstructive CAD with an STEMI in 2016, chronic respiratory failure with home oxygen at 3 L, osteoporosis, type 2 diabetes. Patient presents with chest pain there is been intermittent over the past 2 days. Worse with activity, improved with rest. No diaphoresis, shortness of breath. Has had a lot of nausea and diminished appetite over the past 3 days. Only vomited once, which was this morning. Patient has continued to take Lasix and insulin. No other provoking or palliating factors  Emergency Department Course: Metabolic panel shows acute renal injury. First troponin negative.  Review of Systems:   Pt denies any fevers, chills, diarrhea, constipation, abdominal pain, shortness of breath, dyspnea on exertion, orthopnea, cough, wheezing, palpitations, headache, vision changes, lightheadedness, dizziness, melena, rectal bleeding.  Review of systems are otherwise negative  Past Medical History:  Diagnosis Date  . Adenocarcinoma of lung ([place])    Left lung 2009, resected  . Arthritis   . Back pain   . CHF (congestive heart failure) ([place])   . COPD (chronic obstructive pulmonary disease) ([place])   . Diverticulitis   . Dyslipidemia   . Essential hypertension   . H/O ventral hernia   . Non-obstructive CAD    a. 04/2015 NSTEMI/Cath: LAD 10p, LCX 44m RCA 375m20d, EF 35-40 w/ apical ballooning.  . On home O2    3L N/C  . Osteoporosis   . Osteoporosis 11/04/2015   Managed by Dr. FaLegrand Rams . Parkinson's disease ([facility]  . Shortness of breath   .  Takotsubo cardiomyopathy    a. 04/2015 Echo: EF 45-50%, mid-dist anterior/apical/inferoapical HK w/ hyperdynamic base. Gr 1 DD, mild AI, mild-mod MR, triv TR, PASP 4819m;  b. 04/2015 LV gram: Ef 35-40% w/ apical ballooning.  . Type II diabetes mellitus ([facility] [place] . Ventricular bigeminy    a. 04/2015 in setting of NSTEMI/Takotsubo.   Past Surgical History:  Procedure Laterality Date  . ABDOMINAL HYSTERECTOMY    . CARDIAC CATHETERIZATION N/A 04/17/2015   Procedure: Left Heart Cath and Coronary Angiography;  Surgeon: ThoTroy SineD;  Location: [facility];  Service: Cardiovascular;  Laterality: N/A;  . CHOLECYSTECTOMY    . COLONOSCOPY N/A 09/18/2014   Procedure: COLONOSCOPY;  Surgeon: SanDanie BinderD;  Location: [facility];  Service: Endoscopy;  Laterality: N/A;  8:30 AM - moved to 10:30 - [place] notify pt  . ECTOPIC PREGNANCY SURGERY    . INCISIONAL HERNIA REPAIR N/A 08/26/2013   Procedure: HERNIA REPAIR INCISIONAL WITH MESH;  Surgeon: MarJamesetta SoD;  Location: [facility];  Service: General;  Laterality: N/A;  . LUNG CANCER SURGERY     Social History:  reports that she quit smoking about 10 years ago. Her smoking use included Cigarettes. She quit after 20.00 years of use. She has never used smokeless tobacco. She reports that she does not drink alcohol or use drugs. [facility]    Family History  Problem Relation Age of Onset  . Diabetes Mother   .  Hypertension Mother   . Diabetes Father   . Asthma    . Cancer    . Heart attack Sister     X2  . Colon cancer Neg Hx       Prior to Admission medications   Medication Sig Start Date End Date Taking? Authorizing Provider  albuterol (PROAIR HFA) 108 (90 BASE) MCG/ACT inhaler Inhale 2 puffs into the lungs every 4 (four) hours as needed for wheezing or shortness of breath.   Yes Historical Provider, MD  alendronate (FOSAMAX) 70 MG tablet Take 70 mg by mouth  every Friday.    Yes Historical Provider, MD  aspirin EC 81 MG tablet Take 81 mg by mouth daily.   Yes Historical Provider, MD  atorvastatin (LIPITOR) 40 MG tablet Take 40 mg by mouth every evening.   Yes Historical Provider, MD  benzonatate (TESSALON) 100 MG capsule Take 100 mg by mouth 3 (three) times daily as needed for cough.   Yes Historical Provider, MD  buPROPion (WELLBUTRIN SR) 150 MG 12 hr tablet Take 150 mg by mouth 2 (two) times daily.   Yes Historical Provider, MD  cholecalciferol (VITAMIN D) 1000 units tablet Take 1,000 Units by mouth daily.   Yes Historical Provider, MD  furosemide (LASIX) 40 MG tablet Take 1 tablet by mouth daily, take 1 extra pill daily as needed for weight increase of 3 lbs in a day or 5 lbs in a week Patient taking differently: Take 40 mg by mouth daily. Pt takes one extra tablet daily for weight gain of 3lbs in one day or 5lbs in one week. 05/21/16  Yes Isaiah Serge, NP  gabapentin (NEURONTIN) 300 MG capsule Take 600 mg by mouth at bedtime.    Yes Historical Provider, MD  Garlic 154 MG TABS Take 300 mg by mouth daily.   Yes Historical Provider, MD  ipratropium-albuterol (DUONEB) 0.5-2.5 (3) MG/3ML SOLN Inhale 3 mLs into the lungs every 6 (six) hours as needed (for wheezing/shortness of breath).    Yes Historical Provider, MD  LANTUS SOLOSTAR 100 UNIT/ML Solostar Pen Inject 15 Units into the skin at bedtime.    Yes Historical Provider, MD  linagliptin (TRADJENTA) 5 MG TABS tablet Take 5 mg by mouth daily.   Yes Historical Provider, MD  loratadine (CLARITIN) 10 MG tablet Take 10 mg by mouth daily.   Yes Historical Provider, MD  losartan (COZAAR) 50 MG tablet Take 50 mg by mouth daily.   Yes Historical Provider, MD  meclizine (ANTIVERT) 25 MG tablet Take 25 mg by mouth daily.    Yes Historical Provider, MD  metoprolol succinate (TOPROL-XL) 25 MG 24 hr tablet Take 1 tablet (25 mg total) by mouth daily. 06/26/15  Yes Jettie Booze, MD  nitroGLYCERIN (NITROSTAT)  0.4 MG SL tablet Place 0.4 mg under the tongue every 5 (five) minutes as needed for chest pain.   Yes Historical Provider, MD  omega-3 acid ethyl esters (LOVAZA) 1 g capsule Take 1 g by mouth 2 (two) times daily.   Yes Historical Provider, MD  omeprazole (PRILOSEC) 20 MG capsule Take 1 capsule (20 mg total) by mouth daily. 10/01/16  Yes Jettie Booze, MD  potassium chloride (K-DUR) 10 MEQ tablet Take 1 tablet (10 mEq total) by mouth daily. 05/21/16  Yes Isaiah Serge, NP  pyridoxine (B-6) 100 MG tablet Take 100 mg by mouth daily.    Yes Historical Provider, MD  rOPINIRole (REQUIP) 0.25 MG tablet Take 0.25 mg by mouth 3 (  three) times daily.    Yes Historical Provider, MD  sodium chloride (OCEAN) 0.65 % SOLN nasal spray Place 1 spray into both nostrils every 3 (three) hours as needed for congestion.    Yes Historical Provider, MD  traMADol (ULTRAM) 50 MG tablet Take 50 mg by mouth every 12 (twelve) hours as needed for moderate pain.    Yes Historical Provider, MD    Physical Exam: BP 137/68   Pulse 66   Temp 98.7 F (37.1 C) (Oral)   Resp 14   Wt 78.5 kg (173 lb)   SpO2 100%   BMI 29.70 kg/m   General: Elderly black female. Awake and alert and oriented x3. No acute cardiopulmonary distress.  HEENT: Normocephalic atraumatic.  Right and left ears normal in appearance.  Pupils equal, round, reactive to light. Extraocular muscles are intact. Sclerae anicteric and noninjected.  Dry mucosal membranes. No mucosal lesions.  Neck: Neck supple without lymphadenopathy. No carotid bruits. No masses palpated.  Cardiovascular: Regular rate with normal S1-S2 sounds. No murmurs, rubs, gallops auscultated. No JVD.  Respiratory: Good respiratory effort with no wheezes, rales, rhonchi. Lungs clear to auscultation bilaterally.  No accessory muscle use. Abdomen: Soft, nontender, nondistended. Active bowel sounds. No masses or hepatosplenomegaly  Skin: No rashes, lesions, or ulcerations.  Dry, warm to  touch. 2+ dorsalis pedis and radial pulses. Musculoskeletal: No calf or leg pain. All major joints not erythematous nontender.  No upper or lower joint deformation.  Good ROM.  No contractures  Psychiatric: Intact judgment and insight. Pleasant and cooperative. Neurologic: No focal neurological deficits. Strength is 5/5 and symmetric in upper and lower extremities.  Cranial nerves II through XII are grossly intact.           Labs on Admission: I have personally reviewed following labs and imaging studies  CBC:  Recent Labs Lab 04/04/17 1530  WBC 8.2  NEUTROABS 5.1  HGB 12.5  HCT 39.8  MCV 96.1  PLT 517   Basic Metabolic Panel:  Recent Labs Lab 04/04/17 1530  NA 139  K 5.1  CL 99*  CO2 32  GLUCOSE 117*  BUN 44*  CREATININE 2.94*  CALCIUM 11.7*   GFR: Estimated Creatinine Clearance: 15.2 mL/min (A) (by C-G formula based on SCr of 2.94 mg/dL (H)). Liver Function Tests:  Recent Labs Lab 04/04/17 1530  AST 30  ALT 28  ALKPHOS 56  BILITOT 0.4  PROT 7.9  ALBUMIN 4.2    Recent Labs Lab 04/04/17 1530  LIPASE 48   No results for input(s): AMMONIA in the last 168 hours. Coagulation Profile: No results for input(s): INR, PROTIME in the last 168 hours. Cardiac Enzymes:  Recent Labs Lab 04/04/17 1530  TROPONINI <0.03   BNP (last 3 results) No results for input(s): PROBNP in the last 8760 hours. HbA1C: No results for input(s): HGBA1C in the last 72 hours. CBG: No results for input(s): GLUCAP in the last 168 hours. Lipid Profile: No results for input(s): CHOL, HDL, LDLCALC, TRIG, CHOLHDL, LDLDIRECT in the last 72 hours. Thyroid Function Tests: No results for input(s): TSH, T4TOTAL, FREET4, T3FREE, THYROIDAB in the last 72 hours. Anemia Panel: No results for input(s): VITAMINB12, FOLATE, FERRITIN, TIBC, IRON, RETICCTPCT in the last 72 hours. Urine analysis:    Component Value Date/Time   COLORURINE YELLOW 04/19/2012 1411   APPEARANCEUR CLEAR 04/19/2012  1411   LABSPEC 1.010 04/19/2012 1411   PHURINE 5.0 04/19/2012 1411   GLUCOSEU NEGATIVE 04/19/2012 1411   [place] 04/19/2012  [place] 04/19/2012 1411   [place] 04/19/2012 1411   PROTEINUR NEGATIVE 04/19/2012 1411   UROBILINOGEN 0.2 04/19/2012 1411   NITRITE NEGATIVE 04/19/2012 1411   LEUKOCYTESUR NEGATIVE 04/19/2012 1411   Sepsis Labs: '@LABRCNTIP'$ (procalcitonin:4,lacticidven:4) )No results found for this or any previous visit (from the past 240 hour(s)).   Radiological Exams on Admission: Dg Chest 2 View  Result Date: 04/04/2017 CLINICAL DATA:  Nausea vomiting.  Epigastric pain. EXAM: CHEST  2 VIEW COMPARISON:  PET-CT 03/25/2017 FINDINGS: Cardiomediastinal silhouette is normal. Mediastinal contours appear intact. Tortuosity and calcific atherosclerotic disease of the aorta. There is no evidence of pneumothorax. There is a left lower lobe airspace consolidation. Previously demonstrated by CT central peribronchial masses in the right lower lobe projects as nodular and linear opacities. Mild emphysematous changes. Osseous structures are without acute abnormality. Soft tissues are grossly normal. IMPRESSION: Left lower lobe airspace consolidation, which likely represents the ground-glass opacity seen by recent CT. Known right lower lobe central peribronchial masses. No evidence of new focal airspace consolidation. Atherosclerotic disease of the aorta. Electronically Signed   By: Fidela Salisbury M.D.   On: 04/04/2017 16:56    EKG: Independently reviewed. Sinus rhythm. Old atrioseptal infarct, no acute ST changes. Appears consistent with prior  Assessment/Plan: Principal Problem:   Acute renal injury ([place]) Active Problems:   Essential hypertension   Type II diabetes mellitus (HCC)   CHF (congestive heart failure) (HCC)   Chest pain   Nausea and vomiting    This patient was discussed with the ED physician, including pertinent vitals, physical exam  findings, labs, and imaging.  We also discussed care given by the ED provider.  #1 acute renal injury  Observation  IV fluids - we'll be cautious with heart failure  Recheck creatinine in the morning  Hold Lasix #2 chest pain  Serial troponins  Telemetry monitoring #3 nausea and vomiting  Zofran #4 type 2 diabetes  CBGs before meals and daily at bedtime  Lantus  Hold Tradjenta  SSI #5 CHF  Hold Lasix  We'll be cautious with fluid replacement #6 hypertension  Continue antihypertensives  DVT prophylaxis: Lovenox Consultants: None Code Status: Full code Family Communication: None  Disposition Plan: Observation on telemetry, will likely be able to return home tomorrow   Truett Mainland, DO [facility] Pager [phone]  If 7PM-7AM, please contact night-coverage [email or web address] Password [email or web address]

## 2017-04-05 DIAGNOSIS — R079 Chest pain, unspecified: Secondary | ICD-10-CM | POA: Diagnosis not present

## 2017-04-05 DIAGNOSIS — I509 Heart failure, unspecified: Secondary | ICD-10-CM | POA: Diagnosis not present

## 2017-04-05 LAB — BASIC METABOLIC PANEL
Anion gap: 6 (ref 5–15)
BUN: 36 mg/dL — ABNORMAL HIGH (ref 6–20)
CO2: 32 mmol/L (ref 22–32)
Calcium: 10.1 mg/dL (ref 8.9–10.3)
Chloride: 98 mmol/L — ABNORMAL LOW (ref 101–111)
Creatinine, Ser: 2.19 mg/dL — ABNORMAL HIGH (ref 0.44–1.00)
GFR calc Af Amer: 23 mL/min — ABNORMAL LOW (ref >60)
GFR calc non Af Amer: 20 mL/min — ABNORMAL LOW (ref >60)
Glucose, Bld: 98 mg/dL (ref 65–99)
Potassium: 4.5 mmol/L (ref 3.5–5.1)
Sodium: 136 mmol/L (ref 135–145)

## 2017-04-05 LAB — GLUCOSE, CAPILLARY
Glucose-Capillary: 88 mg/dL (ref 65–99)
Glucose-Capillary: 141 mg/dL — ABNORMAL HIGH (ref 65–99)
Glucose-Capillary: 111 mg/dL — ABNORMAL HIGH (ref 65–99)
Glucose-Capillary: 84 mg/dL (ref 65–99)

## 2017-04-05 LAB — TROPONIN I
Troponin I: <0.03 ng/mL (ref <0.03)
Troponin I: <0.03 ng/mL (ref <0.03)

## 2017-04-05 MED ORDER — POTASSIUM CHLORIDE CRYS ER 10 MEQ PO TBCR
10.0000 meq | EXTENDED_RELEASE_TABLET | Freq: Every day | ORAL | Status: DC
  Administered 2017-04-05 – 2017-04-06 (×2): 10 meq via ORAL
  Filled 2017-04-05 (×2): qty 1

## 2017-04-05 MED ORDER — ALBUTEROL SULFATE (2.5 MG/3ML) 0.083% IN NEBU
2.5000 mg | INHALATION_SOLUTION | Freq: Two times a day (BID) | RESPIRATORY_TRACT | Status: DC
  Administered 2017-04-06: 2.5 mg via RESPIRATORY_TRACT
  Filled 2017-04-05: qty 3

## 2017-04-05 MED ORDER — ALUM & MAG HYDROXIDE-SIMETH 200-200-20 MG/5ML PO SUSP
15.0000 mL | ORAL | Status: DC | PRN
  Administered 2017-04-05 – 2017-04-06 (×2): 15 mL via ORAL
  Filled 2017-04-05 (×2): qty 30

## 2017-04-05 MED ORDER — ORAL CARE MOUTH RINSE
15.0000 mL | Freq: Two times a day (BID) | OROMUCOSAL | Status: DC
  Administered 2017-04-05 – 2017-04-06 (×2): 15 mL via OROMUCOSAL

## 2017-04-05 MED ORDER — SODIUM CHLORIDE 0.9 % IV SOLN
INTRAVENOUS | Status: DC
  Administered 2017-04-05 – 2017-04-06 (×2): via INTRAVENOUS

## 2017-04-05 NOTE — Progress Notes (Signed)
Subjective: Patient was admitted due to chest discomfort, nausea and vomiting. She had serial EKG and cardiac enzymes which was negative for acute coronary syndrome. Her BUN and Cr was elevated. Patient is being treated for acute kidney injury. She feels much better.  Objective: Vital signs in last 24 hours: Temp:  [97.9 F (36.6 C)-98.7 F (37.1 C)] 98.1 F (36.7 C) (04/29 0635) Pulse Rate:  [63-78] 63 (04/29 0635) Resp:  [11-18] 16 (04/29 0635) BP: (108-147)/(44-68) 132/49 (04/29 0635) SpO2:  [97 %-100 %] 99 % (04/29 0816) Weight:  [78.5 kg (173 lb)-81.2 kg (179 lb 0.2 oz)] 81 kg (178 lb 9.2 oz) (04/29 0635) Weight change:  Last BM Date: 04/03/17  Intake/Output from previous day: 04/28 0701 - 04/29 0700 In: 880 [P.O.:240; I.V.:640] Out: 1350 [Urine:1350]  PHYSICAL EXAM General appearance: alert and no distress Resp: diminished breath sounds bilaterally Cardio: S1, S2 normal GI: soft, non-tender; bowel sounds normal; no masses,  no organomegaly Extremities: extremities normal, atraumatic, no cyanosis or edema  Lab Results:  Results for orders placed or performed during the hospital encounter of 04/04/17 (from the past 48 hour(s))  Comprehensive metabolic panel     Status: Abnormal   Collection Time: 04/04/17  3:30 PM  Result Value Ref Range   Sodium 139 135 - 145 mmol/L   Potassium 5.1 3.5 - 5.1 mmol/L   Chloride 99 (L) 101 - 111 mmol/L   CO2 32 22 - 32 mmol/L   Glucose, Bld 117 (H) 65 - 99 mg/dL   BUN 44 (H) 6 - 20 mg/dL   Creatinine, Ser 2.94 (H) 0.44 - 1.00 mg/dL   Calcium 11.7 (H) 8.9 - 10.3 mg/dL   Total Protein 7.9 6.5 - 8.1 g/dL   Albumin 4.2 3.5 - 5.0 g/dL   AST 30 15 - 41 U/L   ALT 28 14 - 54 U/L   Alkaline Phosphatase 56 38 - 126 U/L   Total Bilirubin 0.4 0.3 - 1.2 mg/dL   GFR calc non Af Amer 14 (L) >60 mL/min   GFR calc Af Amer 16 (L) >60 mL/min    Comment: (NOTE) The eGFR has been calculated using the CKD EPI equation. This calculation has not been  validated in all clinical situations. eGFR's persistently <60 mL/min signify possible Chronic Kidney Disease.    Anion gap 8 5 - 15  Lipase, blood     Status: None   Collection Time: 04/04/17  3:30 PM  Result Value Ref Range   Lipase 48 11 - 51 U/L  Brain natriuretic peptide (order if patient c/o SOB ONLY)     Status: None   Collection Time: 04/04/17  3:30 PM  Result Value Ref Range   B Natriuretic Peptide 9.0 0.0 - 100.0 pg/mL  CBC with Differential/Platelet     Status: None   Collection Time: 04/04/17  3:30 PM  Result Value Ref Range   WBC 8.2 4.0 - 10.5 K/uL   RBC 4.14 3.87 - 5.11 MIL/uL   Hemoglobin 12.5 12.0 - 15.0 g/dL   HCT 39.8 36.0 - 46.0 %   MCV 96.1 78.0 - 100.0 fL   MCH 30.2 26.0 - 34.0 pg   MCHC 31.4 30.0 - 36.0 g/dL   RDW 15.1 11.5 - 15.5 %   Platelets 251 150 - 400 K/uL   Neutrophils Relative % 62 %   Neutro Abs 5.1 1.7 - 7.7 K/uL   Lymphocytes Relative 23 %   Lymphs Abs 1.9 0.7 - 4.0 K/uL     Monocytes Relative 11 %   Monocytes Absolute 0.9 0.1 - 1.0 K/uL   Eosinophils Relative 4 %   Eosinophils Absolute 0.3 0.0 - 0.7 K/uL   Basophils Relative 0 %   Basophils Absolute 0.0 0.0 - 0.1 K/uL  Troponin I     Status: None   Collection Time: 04/04/17  3:30 PM  Result Value Ref Range   Troponin I <0.03 <0.03 ng/mL  Glucose, capillary     Status: None   Collection Time: 04/04/17  8:32 PM  Result Value Ref Range   Glucose-Capillary 89 65 - 99 mg/dL   Comment 1 Notify RN    Comment 2 Document in Chart   Troponin I-serum (0, 3, 6 hours)     Status: None   Collection Time: 04/04/17  9:52 PM  Result Value Ref Range   Troponin I <0.03 <0.03 ng/mL  Troponin I-serum (0, 3, 6 hours)     Status: None   Collection Time: 04/05/17 12:38 AM  Result Value Ref Range   Troponin I <0.03 <0.03 ng/mL  Basic metabolic panel     Status: Abnormal   Collection Time: 04/05/17  6:11 AM  Result Value Ref Range   Sodium 136 135 - 145 mmol/L   Potassium 4.5 3.5 - 5.1 mmol/L    Chloride 98 (L) 101 - 111 mmol/L   CO2 32 22 - 32 mmol/L   Glucose, Bld 98 65 - 99 mg/dL   BUN 36 (H) 6 - 20 mg/dL   Creatinine, Ser 2.19 (H) 0.44 - 1.00 mg/dL   Calcium 10.1 8.9 - 10.3 mg/dL   GFR calc non Af Amer 20 (L) >60 mL/min   GFR calc Af Amer 23 (L) >60 mL/min    Comment: (NOTE) The eGFR has been calculated using the CKD EPI equation. This calculation has not been validated in all clinical situations. eGFR's persistently <60 mL/min signify possible Chronic Kidney Disease.    Anion gap 6 5 - 15  Troponin I     Status: None   Collection Time: 04/05/17  6:11 AM  Result Value Ref Range   Troponin I <0.03 <0.03 ng/mL  Glucose, capillary     Status: None   Collection Time: 04/05/17  7:38 AM  Result Value Ref Range   Glucose-Capillary 88 65 - 99 mg/dL    ABGS No results for input(s): PHART, PO2ART, TCO2, HCO3 in the last 72 hours.  Invalid input(s): PCO2 CULTURES No results found for this or any previous visit (from the past 240 hour(s)). Studies/Results: Dg Chest 2 View  Result Date: 04/04/2017 CLINICAL DATA:  Nausea vomiting.  Epigastric pain. EXAM: CHEST  2 VIEW COMPARISON:  PET-CT 03/25/2017 FINDINGS: Cardiomediastinal silhouette is normal. Mediastinal contours appear intact. Tortuosity and calcific atherosclerotic disease of the aorta. There is no evidence of pneumothorax. There is a left lower lobe airspace consolidation. Previously demonstrated by CT central peribronchial masses in the right lower lobe projects as nodular and linear opacities. Mild emphysematous changes. Osseous structures are without acute abnormality. Soft tissues are grossly normal. IMPRESSION: Left lower lobe airspace consolidation, which likely represents the ground-glass opacity seen by recent CT. Known right lower lobe central peribronchial masses. No evidence of new focal airspace consolidation. Atherosclerotic disease of the aorta. Electronically Signed   By: Dobrinka  Dimitrova M.D.   On:  04/04/2017 16:56    Medications: I have reviewed the patient's current medications.  Assesment:  Principal Problem:   Acute renal injury (HCC) Active Problems:   Essential   hypertension   Type II diabetes mellitus (HCC)   CHF (congestive heart failure) (HCC)   Chest pain   Nausea and vomiting    Plan:  Medications reviewed Will gent;le rehydrate Will monitor BMP Continue regular treatment    LOS: 0 days   Kamaile Zachow 04/05/2017, 9:25 AM

## 2017-04-06 ENCOUNTER — Encounter: Payer: Self-pay | Admitting: Thoracic Surgery (Cardiothoracic Vascular Surgery)

## 2017-04-06 ENCOUNTER — Institutional Professional Consult (permissible substitution) (INDEPENDENT_AMBULATORY_CARE_PROVIDER_SITE_OTHER): Payer: Medicare Other | Admitting: Thoracic Surgery (Cardiothoracic Vascular Surgery)

## 2017-04-06 VITALS — BP 122/59 | HR 83 | Resp 18 | Ht 64.0 in | Wt 173.0 lb

## 2017-04-06 DIAGNOSIS — Z85118 Personal history of other malignant neoplasm of bronchus and lung: Secondary | ICD-10-CM

## 2017-04-06 DIAGNOSIS — R918 Other nonspecific abnormal finding of lung field: Secondary | ICD-10-CM | POA: Diagnosis not present

## 2017-04-06 DIAGNOSIS — R590 Localized enlarged lymph nodes: Secondary | ICD-10-CM | POA: Diagnosis not present

## 2017-04-06 DIAGNOSIS — I509 Heart failure, unspecified: Secondary | ICD-10-CM | POA: Diagnosis not present

## 2017-04-06 DIAGNOSIS — R079 Chest pain, unspecified: Secondary | ICD-10-CM | POA: Diagnosis not present

## 2017-04-06 LAB — GLUCOSE, CAPILLARY
Glucose-Capillary: 95 mg/dL (ref 65–99)
Glucose-Capillary: 105 mg/dL — ABNORMAL HIGH (ref 65–99)

## 2017-04-06 LAB — BASIC METABOLIC PANEL
Anion gap: 4 — ABNORMAL LOW (ref 5–15)
BUN: 23 mg/dL — ABNORMAL HIGH (ref 6–20)
CO2: 31 mmol/L (ref 22–32)
Calcium: 9.2 mg/dL (ref 8.9–10.3)
Chloride: 102 mmol/L (ref 101–111)
Creatinine, Ser: 1.69 mg/dL — ABNORMAL HIGH (ref 0.44–1.00)
GFR calc Af Amer: 32 mL/min — ABNORMAL LOW (ref >60)
GFR calc non Af Amer: 27 mL/min — ABNORMAL LOW (ref >60)
Glucose, Bld: 97 mg/dL (ref 65–99)
Potassium: 4.8 mmol/L (ref 3.5–5.1)
Sodium: 137 mmol/L (ref 135–145)

## 2017-04-06 NOTE — Progress Notes (Signed)
Pt Iv removed, WNL. D/C instructions given and explained to pt and family. Pt is leaving at 2:00 to go to appt in [place]. Family present to take to appt.

## 2017-04-06 NOTE — Discharge Summary (Signed)
Physician Discharge Summary  Patient ID: Angela Lawson MRN: [id number] DOB/AGE: 1934/09/28 83 y.o. Primary Muscoy, MD Admit date: 04/04/2017 Discharge date: 04/06/2017    Discharge Diagnoses:   Principal Problem:   Acute renal injury [facility]) Active Problems:   Essential hypertension   Type II diabetes mellitus (HCC)   CHF (congestive heart failure) (HCC)   Chest pain   Nausea and vomiting   Allergies as of 04/06/2017      Reactions   Lisinopril Cough      Medication List    STOP taking these medications   benzonatate 100 MG capsule Commonly known as:  TESSALON     TAKE these medications   alendronate 70 MG tablet Commonly known as:  FOSAMAX Take 70 mg by mouth every Friday.   aspirin EC 81 MG tablet Take 81 mg by mouth daily.   atorvastatin 40 MG tablet Commonly known as:  LIPITOR Take 40 mg by mouth every evening.   buPROPion 150 MG 12 hr tablet Commonly known as:  WELLBUTRIN SR Take 150 mg by mouth 2 (two) times daily.   cholecalciferol 1000 units tablet Commonly known as:  VITAMIN D Take 1,000 Units by mouth daily.   furosemide 40 MG tablet Commonly known as:  LASIX Take 1 tablet by mouth daily, take 1 extra pill daily as needed for weight increase of 3 lbs in a day or 5 lbs in a week What changed:  how much to take  how to take this  when to take this  additional instructions   gabapentin 300 MG capsule Commonly known as:  NEURONTIN Take 600 mg by mouth at bedtime.   Garlic 211 MG Tabs Take 300 mg by mouth daily.   ipratropium-albuterol 0.5-2.5 (3) MG/3ML Soln Commonly known as:  DUONEB Inhale 3 mLs into the lungs every 6 (six) hours as needed (for wheezing/shortness of breath).   LANTUS SOLOSTAR 100 UNIT/ML Solostar Pen Generic drug:  Insulin Glargine Inject 15 Units into the skin at bedtime.   linagliptin 5 MG Tabs tablet Commonly known as:  TRADJENTA Take 5 mg by mouth daily.   loratadine 10 MG  tablet Commonly known as:  CLARITIN Take 10 mg by mouth daily.   losartan 50 MG tablet Commonly known as:  COZAAR Take 50 mg by mouth daily.   meclizine 25 MG tablet Commonly known as:  ANTIVERT Take 25 mg by mouth daily.   metoprolol succinate 25 MG 24 hr tablet Commonly known as:  TOPROL-XL Take 1 tablet (25 mg total) by mouth daily.   nitroGLYCERIN 0.4 MG SL tablet Commonly known as:  NITROSTAT Place 0.4 mg under the tongue every 5 (five) minutes as needed for chest pain.   omega-3 acid ethyl esters 1 g capsule Commonly known as:  LOVAZA Take 1 g by mouth 2 (two) times daily.   omeprazole 20 MG capsule Commonly known as:  PRILOSEC Take 1 capsule (20 mg total) by mouth daily.   potassium chloride 10 MEQ tablet Commonly known as:  K-DUR Take 1 tablet (10 mEq total) by mouth daily.   PROAIR HFA 108 (90 Base) MCG/ACT inhaler Generic drug:  albuterol Inhale 2 puffs into the lungs every 4 (four) hours as needed for wheezing or shortness of breath.   pyridoxine 100 MG tablet Commonly known as:  B-6 Take 100 mg by mouth daily.   rOPINIRole 0.25 MG tablet Commonly known as:  REQUIP Take 0.25 mg by mouth 3 (three) times daily.   sodium  chloride 0.65 % Soln nasal spray Commonly known as:  OCEAN Place 1 spray into both nostrils every 3 (three) hours as needed for congestion.   traMADol 50 MG tablet Commonly known as:  ULTRAM Take 50 mg by mouth every 12 (twelve) hours as needed for moderate pain.       Discharged Condition: home    Consults: none  Significant Diagnostic Studies: Dg Chest 2 View  Result Date: 04/04/2017 CLINICAL DATA:  Nausea vomiting.  Epigastric pain. EXAM: CHEST  2 VIEW COMPARISON:  PET-CT 03/25/2017 FINDINGS: Cardiomediastinal silhouette is normal. Mediastinal contours appear intact. Tortuosity and calcific atherosclerotic disease of the aorta. There is no evidence of pneumothorax. There is a left lower lobe airspace consolidation. Previously  demonstrated by CT central peribronchial masses in the right lower lobe projects as nodular and linear opacities. Mild emphysematous changes. Osseous structures are without acute abnormality. Soft tissues are grossly normal. IMPRESSION: Left lower lobe airspace consolidation, which likely represents the ground-glass opacity seen by recent CT. Known right lower lobe central peribronchial masses. No evidence of new focal airspace consolidation. Atherosclerotic disease of the aorta. Electronically Signed   By: Fidela Salisbury M.D.   On: 04/04/2017 16:56   Ct Chest Wo Contrast  Result Date: 03/25/2017 CLINICAL DATA:  History of non-small-cell lung cancer diagnosed 2009, follow-up abnormal PET EXAM: CT CHEST WITHOUT CONTRAST TECHNIQUE: Multidetector CT imaging of the chest was performed following the standard protocol without IV contrast. COMPARISON:  PET-CT dated 01/21/2017.  CT chest dated 01/07/2017. FINDINGS: Cardiovascular: The heart is normal in size. No pericardial effusion. Three vessel coronary atherosclerosis. No evidence of thoracic aortic aneurysm. Atherosclerotic calcifications of the aortic arch. Mediastinum/Nodes: Hypermetabolic mediastinal lymph nodes, including: --6 mm short axis left paratracheal node (series 2/ image 32) --10 mm short axis high right paratracheal node (series 2/ image 38) --8 mm short axis prevascular node (series 2/ image 53) --11 mm short axis low right paratracheal node (series 2/image 54) Bilateral hilar regions are hypermetabolic on PET, but poorly evaluated on unenhanced CT. Visualized thyroid is grossly unremarkable. Lungs/Pleura: Stable ground-glass opacity with lepidic growth pattern in the left lower lobe, measuring approximately 5.9 x 5.5 cm (series 6/image 91), hypermetabolic on prior PET. Overall, this appearance is worrisome for low-grade adenocarcinoma. 2.2 cm cavitary lesion in the central right lower lobe with surrounding hypermetabolic nodularity measuring up  to 18 mm (series 6/ image 102), worrisome for cavitary neoplasm such as squamous cell carcinoma. Underlying moderate centrilobular emphysematous changes, upper lobe predominant. Right apical pleural-parenchymal scarring. No pleural effusion or pneumothorax. Upper Abdomen: Visualized upper abdomen is grossly unremarkable. Musculoskeletal: Degenerative changes of the visualized thoracolumbar spine. IMPRESSION: Stable 5.9 cm left lower lobe ground-glass opacity, hypermetabolic on prior PET, worrisome for primary bronchogenic neoplasm such as low-grade adenocarcinoma. Stable 2.2 cm cavitary lesion in the central right lower lobe with surrounding hypermetabolic nodularity measuring up to 1.8 cm, worrisome for primary bronchogenic neoplasm such as squamous cell carcinoma. Hypermetabolic mediastinal lymph nodes measuring up to 11 mm short axis, as above. Bilateral hilar regions are hypermetabolic on PET but poorly evaluated on unenhanced CT. Emphysema and aortic atherosclerosis. Electronically Signed   By: Julian Hy M.D.   On: 03/25/2017 12:55    Lab Results: Basic Metabolic Panel:  Recent Labs  04/05/17 0611 04/06/17 0224  NA 136 137  K 4.5 4.8  CL 98* 102  CO2 32 31  GLUCOSE 98 97  BUN 36* 23*  CREATININE 2.19* 1.69*  CALCIUM 10.1 9.2  Liver Function Tests:  Recent Labs  04/04/17 1530  AST 30  ALT 28  ALKPHOS 56  BILITOT 0.4  PROT 7.9  ALBUMIN 4.2     CBC:  Recent Labs  04/04/17 1530  WBC 8.2  NEUTROABS 5.1  HGB 12.5  HCT 39.8  MCV 96.1  PLT 251    No results found for this or any previous visit (from the past 240 hour(s)).   Hospital Course:   This is an 83 years old female with history of multiple medical illness was admitted due to chest pain, nausea and vomiting. She was admitted under telemetry and serial EKG and cardiac enzymes were done . It was negative for acute coronary syndrome. Patient was found also to have worsening of her renal function. She was  gently rehydrated and her diuretics was held. Patient improved and her renal function improved. Her Cr from 2.94 on admission to 1.69 on discharge. Patient will be followed in  the office.  Discharge Exam: Blood pressure (!) 129/57, pulse 66, temperature 98.4 F (36.9 C), temperature source Oral, resp. rate 16, height '5\' 4"'$  (1.626 m), weight 81 kg (178 lb 9.2 oz), SpO2 97 %.   Disposition:  Home    Follow-up Information    Chardonay Scritchfield, MD Follow up in 2 week(s).   Specialty:  Internal Medicine Contact information: [place] [address] [phone]           Signed: Rosita Fire   04/06/2017, 8:16 AM

## 2017-04-06 NOTE — Progress Notes (Signed)
PCP is Rosita Fire, MD Referring Provider is Holley Bouche, NP  Chief Complaint  Patient presents with  . Lung Lesion    Surgical eval, Chest CT 03/25/2017, HX of lung cancer, PET Scan 01/21/17 ...s/p RES. LULobe 2009 in [place]    HPI: Ms. Radliff is an 83 year old woman sent for consultation regarding bilateral lung abnormalities and mediastinal adenopathy.  Ms. Gutzmer is an 83 year old woman with history of a lung resection for adenocarcinoma in 2009. Past medical history significant for tobacco abuse (quit in 2009), COPD on home oxygen at 3 L nasal cannula, CHF, hypertension, non-ST elevation MI, Takotsubo cardiomyopathy, type 2 diabetes, Parkinson's disease, arthritis, back pain, and osteoporosis.  She has been followed with CT scans since her lung cancer resection in 2009. She had a CT in January which showed some progression of a groundglass opacity in the left lower lobe and nodular opacities around a cavity in the right lower lobe. A PET CT was done which showed these areas were hypermetabolic and also showed some hypermetabolicon adenopathy. She was recovering from his COPD flare so recommendation was to repeat her CT. That was done recently and showed further progression particularly of the right lower lobe nodules. There is no significant change of the mediastinal adenopathy.  She lives at home. She is on 3 L nasal cannula home oxygen. She gets short of breath with walking very short distances she sometimes short of breath when she lies flat on her back and sometimes at rest. She complains of decreased energy and loss of appetite. She denies any weight loss. She denies unusual headaches or visual changes. She does have frequent heartburn, arthritis, and difficulty walking. She denies any recent change in her respiratory status.  Zubrod Score: At the time of surgery this patient's most appropriate activity status/level should be described as: '[]'$     0    Normal activity, no  symptoms '[]'$     1    Restricted in physical strenuous activity but ambulatory, able to do out light work '[]'$     2    Ambulatory and capable of self care, unable to do work activities, up and about >50 % of waking hours                              '[x]'$     3    Only limited self care, in bed greater than 50% of waking hours '[]'$     4    Completely disabled, no self care, confined to bed or chair '[]'$     5    Moribund   Past Medical History:  Diagnosis Date  . Adenocarcinoma of lung ([place])    Left lung 2009, resected  . Arthritis   . Back pain   . CHF (congestive heart failure) ([place])   . COPD (chronic obstructive pulmonary disease) ([place])   . Diverticulitis   . Dyslipidemia   . Essential hypertension   . H/O ventral hernia   . Non-obstructive CAD    a. 04/2015 NSTEMI/Cath: LAD 10p, LCX 78m RCA 383m20d, EF 35-40 w/ apical ballooning.  . On home O2    3L N/C  . Osteoporosis   . Osteoporosis 11/04/2015   Managed by Dr. FaLegrand Rams . Parkinson's disease ([facility]  . Shortness of breath   . Takotsubo cardiomyopathy    a. 04/2015 Echo: EF 45-50%, mid-dist anterior/apical/inferoapical HK w/ hyperdynamic base. Gr 1 DD, mild AI, mild-mod MR,  triv TR, PASP 13mHg;  b. 04/2015 LV gram: Ef 35-40% w/ apical ballooning.  . Type II diabetes mellitus ([place] [place]   . Ventricular bigeminy    a. 04/2015 in setting of NSTEMI/Takotsubo.    Past Surgical History:  Procedure Laterality Date  . ABDOMINAL HYSTERECTOMY    . CARDIAC CATHETERIZATION N/A 04/17/2015   Procedure: Left Heart Cath and Coronary Angiography;  Surgeon: TTroy Sine MD;  [facility];  Service: Cardiovascular;  Laterality: N/A;  . CHOLECYSTECTOMY    . COLONOSCOPY N/A 09/18/2014   Procedure: COLONOSCOPY;  Surgeon: SDanie Binder MD;  Location: [facility];  Service: Endoscopy;  Laterality: N/A;  8:30 AM - moved to 10:30 -Rosendo Grosto notify pt  . ECTOPIC PREGNANCY SURGERY    . INCISIONAL HERNIA REPAIR N/A 08/26/2013   Procedure:  HERNIA REPAIR INCISIONAL WITH MESH;  Surgeon: MJamesetta So MD;  Location: [facility];  Service: General;  Laterality: N/A;  . LUNG CANCER SURGERY      Family History  Problem Relation Age of Onset  . Diabetes Mother   . Hypertension Mother   . Diabetes Father   . Asthma    . Cancer    . Heart attack Sister     X2  . Colon cancer Neg Hx     Social History Social History  Substance Use Topics  . Smoking status: Former Smoker    Years: 20.00    Types: Cigarettes    Quit date: 08/13/2006  . Smokeless tobacco: Never Used  . Alcohol use No    Current Outpatient Prescriptions  Medication Sig Dispense Refill  . albuterol (PROAIR HFA) 108 (90 BASE) MCG/ACT inhaler Inhale 2 puffs into the lungs every 4 (four) hours as needed for wheezing or shortness of breath.    .Marland Kitchenalendronate (FOSAMAX) 70 MG tablet Take 70 mg by mouth every Friday.     .Marland Kitchenaspirin EC 81 MG tablet Take 81 mg by mouth daily.    .Marland Kitchenatorvastatin (LIPITOR) 40 MG tablet Take 40 mg by mouth every evening.    .Marland KitchenbuPROPion (WELLBUTRIN SR) 150 MG 12 hr tablet Take 150 mg by mouth 2 (two) times daily.    . cholecalciferol (VITAMIN D) 1000 units tablet Take 1,000 Units by mouth daily.    . furosemide (LASIX) 40 MG tablet Take 1 tablet by mouth daily, take 1 extra pill daily as needed for weight increase of 3 lbs in a day or 5 lbs in a week (Patient taking differently: Take 40 mg by mouth daily. Pt takes one extra tablet daily for weight gain of 3lbs in one day or 5lbs in one week.) 45 tablet 3  . gabapentin (NEURONTIN) 300 MG capsule Take 600 mg by mouth at bedtime.     . Garlic 3621MG TABS Take 300 mg by mouth daily.    .Marland Kitchenipratropium-albuterol (DUONEB) 0.5-2.5 (3) MG/3ML SOLN Inhale 3 mLs into the lungs every 6 (six) hours as needed (for wheezing/shortness of breath).     .Marland KitchenLANTUS SOLOSTAR 100 UNIT/ML Solostar Pen Inject 15 Units into the skin at bedtime.     .Marland Kitchenlinagliptin (TRADJENTA) 5 MG TABS tablet Take 5 mg by mouth daily.    .Marland Kitchen loratadine (CLARITIN) 10 MG tablet Take 10 mg by mouth daily.  3  . losartan (COZAAR) 50 MG tablet Take 50 mg by mouth daily.  2  . meclizine (ANTIVERT) 25 MG tablet Take 25 mg by mouth daily.     .Marland Kitchen  metoprolol succinate (TOPROL-XL) 25 MG 24 hr tablet Take 1 tablet (25 mg total) by mouth daily. 90 tablet 3  . nitroGLYCERIN (NITROSTAT) 0.4 MG SL tablet Place 0.4 mg under the tongue every 5 (five) minutes as needed for chest pain.    Marland Kitchen omega-3 acid ethyl esters (LOVAZA) 1 g capsule Take 1 g by mouth 2 (two) times daily.    Marland Kitchen omeprazole (PRILOSEC) 20 MG capsule Take 1 capsule (20 mg total) by mouth daily. 60 capsule 3  . potassium chloride (K-DUR) 10 MEQ tablet Take 1 tablet (10 mEq total) by mouth daily. 90 tablet 3  . pyridoxine (B-6) 100 MG tablet Take 100 mg by mouth daily.     Marland Kitchen rOPINIRole (REQUIP) 0.25 MG tablet Take 0.25 mg by mouth 3 (three) times daily.     . sodium chloride (OCEAN) 0.65 % SOLN nasal spray Place 1 spray into both nostrils every 3 (three) hours as needed for congestion.     . traMADol (ULTRAM) 50 MG tablet Take 50 mg by mouth every 12 (twelve) hours as needed for moderate pain.      No current facility-administered medications for this visit.     Allergies  Allergen Reactions  . Lisinopril Cough    Review of Systems  Constitutional: Positive for appetite change.  HENT: Positive for hearing loss. Negative for trouble swallowing and voice change.   Eyes: Negative for visual disturbance.  Respiratory: Positive for cough and shortness of breath.   Cardiovascular: Negative for chest pain.  Gastrointestinal: Positive for abdominal pain. Negative for abdominal distention.  Genitourinary: Negative for difficulty urinating and dysuria.  Musculoskeletal: Positive for arthralgias, back pain and gait problem.  Neurological: Negative for syncope, weakness and headaches.  Hematological: Negative for adenopathy. Does not bruise/bleed easily.    BP (!) 122/59 (BP Location: Left  Arm, Patient Position: Sitting, Cuff Size: Large)   Pulse 83   Resp 18   Ht '5\' 4"'$  (1.626 m)   Wt 173 lb (78.5 kg)   BMI 29.70 kg/m  Physical Exam  Constitutional: She is oriented to person, place, and time. No distress.  Elderly frail-appearing woman  HENT:  Head: Normocephalic and atraumatic.  Mouth/Throat: No oropharyngeal exudate.  Eyes: Conjunctivae and EOM are normal. No scleral icterus.  Neck: Neck supple. No thyromegaly present.  Cardiovascular: Normal rate, regular rhythm and normal heart sounds.   No murmur heard. Pulmonary/Chest: Effort normal. No respiratory distress. She has wheezes (Faint).  Diminished breath sounds bilaterally  Abdominal: Soft. She exhibits no distension. There is no tenderness.  Musculoskeletal: She exhibits no edema.  Lymphadenopathy:    She has no cervical adenopathy.  Neurological: She is alert and oriented to person, place, and time. No cranial nerve deficit.  No focal motor deficit  Skin: Skin is warm and dry.  Psychiatric: She has a normal mood and affect.  Vitals reviewed.    Diagnostic Tests: NUCLEAR MEDICINE PET SKULL BASE TO THIGH  TECHNIQUE: 9.0 mCi F-18 FDG was injected intravenously. Full-ring PET imaging was performed from the skull base to thigh after the radiotracer. CT data was obtained and used for attenuation correction and anatomic localization.  FASTING BLOOD GLUCOSE:  Value: 145 mg/dl  COMPARISON:  Multiple exams, including 03/16/2014  FINDINGS: NECK  No hypermetabolic lymph nodes in the neck.  Chronic ethmoid sinusitis.  CHEST  Newly hypermetabolic paratracheal, prevascular, AP window, bilateral hilar, and periaortic adenopathy in the thorax purely fairly stable low-grade metabolic activity in a left internal mammary lymph  node. An upper right paratracheal node measuring 1.1 cm in short axis on image 48/4 has a maximum SUV of 8.2 and the other mediastinal lymph nodes are similar.  Progressive  hypermetabolic ground-glass opacity posteriorly in the left lower lobe, maximum SUV 9.1  Asymmetric right apical scarring without significant hypermetabolic activity.  Low-grade hypermetabolic activity along ground-glass density posteriorly in the right upper lobe  Clustered nodular opacity in the right lower lobe with low-grade metabolic activity, maximum SUV 3.0.  Underlying emphysema noted.  The 6 by 4 mm subpleural nodule in the left upper lobe on image 29/ 8 is not perceptibly hypermetabolic.  ABDOMEN/PELVIS  No abnormal hypermetabolic activity within the liver, pancreas, adrenal glands, or spleen. No hypermetabolic lymph nodes in the abdomen or pelvis.  Cholecystectomy. Scattered diverticula in the colon with sigmoid colon diverticulosis. Physiologic activity in the bowel.  SKELETON  Severe left and moderate to severe right degenerative hip arthropathy. No compelling findings of osseous metastatic disease.  IMPRESSION: 1. New hypermetabolic adenopathy in the mediastinum and hila, with progressive hypermetabolic ground-glass density posteriorly in the left lower lobe with faint nodular components, and some low-grade hypermetabolic activity in ground-glass opacity dependently in the right upper lobe. Although the appearance is concerning for malignancy potentially with a lymphangitic component in the left lower lobe, the unusual configuration in the appearance of the ground-glass opacities could conceivably represent an atypical granulomatous infectious process as an alternative. This is not classic obvious recurrence with well-defined mass or nodules. 2. There is some ill-defined clustered nodularity in the right lower lobe only faintly metabolic, maximum SUV 3.0. 3. Overall I believe that this process require surveillance to differentiate an inflammatory/infectious condition from recurrent malignancy. The adenopathy is certainly concerning. 4. No  findings of malignancy in the neck, abdomen/pelvis, or skeleton. 5. Sigmoid colon diverticulosis. 6. Emphysema. 7. Chronic ethmoid sinusitis.   Electronically Signed   By: Van Clines M.D.   On: 01/21/2017 13:49  CT CHEST WITHOUT CONTRAST  TECHNIQUE: Multidetector CT imaging of the chest was performed following the standard protocol without IV contrast.  COMPARISON:  PET-CT dated 01/21/2017.  CT chest dated 01/07/2017.  FINDINGS: Cardiovascular: The heart is normal in size. No pericardial effusion.  Three vessel coronary atherosclerosis.  No evidence of thoracic aortic aneurysm. Atherosclerotic calcifications of the aortic arch.  Mediastinum/Nodes: Hypermetabolic mediastinal lymph nodes, including:  --6 mm short axis left paratracheal node (series 2/ image 32)  --10 mm short axis high right paratracheal node (series 2/ image 38)  --8 mm short axis prevascular node (series 2/ image 53)  --11 mm short axis low right paratracheal node (series 2/image 54)  Bilateral hilar regions are hypermetabolic on PET, but poorly evaluated on unenhanced CT.  Visualized thyroid is grossly unremarkable.  Lungs/Pleura: Stable ground-glass opacity with lepidic growth pattern in the left lower lobe, measuring approximately 5.9 x 5.5 cm (series 6/image 91), hypermetabolic on prior PET. Overall, this appearance is worrisome for low-grade adenocarcinoma.  2.2 cm cavitary lesion in the central right lower lobe with surrounding hypermetabolic nodularity measuring up to 18 mm (series 6/ image 102), worrisome for cavitary neoplasm such as squamous cell carcinoma.  Underlying moderate centrilobular emphysematous changes, upper lobe predominant.  Right apical pleural-parenchymal scarring.  No pleural effusion or pneumothorax.  Upper Abdomen: Visualized upper abdomen is grossly unremarkable.  Musculoskeletal: Degenerative changes of the  visualized thoracolumbar spine.  IMPRESSION: Stable 5.9 cm left lower lobe ground-glass opacity, hypermetabolic on prior PET, worrisome for primary bronchogenic neoplasm such as  low-grade adenocarcinoma.  Stable 2.2 cm cavitary lesion in the central right lower lobe with surrounding hypermetabolic nodularity measuring up to 1.8 cm, worrisome for primary bronchogenic neoplasm such as squamous cell carcinoma.  Hypermetabolic mediastinal lymph nodes measuring up to 11 mm short axis, as above. Bilateral hilar regions are hypermetabolic on PET but poorly evaluated on unenhanced CT.  Emphysema and aortic atherosclerosis.   Electronically Signed   By: Julian Hy M.D.   On: 03/25/2017 12:55 I personally reviewed the CT and PET/CT and concur with the findings noted above  Impression: Mrs. Bogdanski is an 83 year old woman with a history of lung cancer who has bilateral pulmonary abnormalities and mediastinal adenopathy. The left lower lobe she has a progressive groundglass opacity. In the right lower lobe there are on multiple nodules surrounding a cavity. She also has mediastinal adenopathy. Differential diagnosis is primarily recurrent cancer versus infectious etiology (granulomatous disease).  I discussed the possible diagnoses with Mrs. Lukach and her daughters. I discussed potential treatments which would be available to her. I wanted to be sure that she would be willing to undergo treatment before we went down the path of doing invasive procedures. She would wish to do so.  I recommended navigational bronchoscopy and endobronchial ultrasound. That will allow [place] to assess both lung abnormalities and the mediastinal nodes at the same setting. She her daughters understand this will be done in the operating room, under general anesthesia, via an endoscopic approach, and on an outpatient basis. I reviewed the indications, risks, benefits, and alternatives. They understand there is no  guarantee of the definitive diagnosis. They understand this is strictly a diagnostic and not therapeutic. They understand the risk include, but are not limited to death, MI, DVT, PE, bleeding, stroke, pneumothorax, as well as the possibility of other unforeseeable complications.  She wishes to talk with her son before she makes a final decision as to whether to proceed. She will call our office to schedule or if she would like to come back and talk more before making a decision.  Plan: Navigational bronchoscopy and endobronchial ultrasound. Patient will call and schedule.  Melrose Nakayama, MD [facility] [phone]

## 2017-04-13 DIAGNOSIS — R079 Chest pain, unspecified: Secondary | ICD-10-CM | POA: Diagnosis not present

## 2017-04-14 ENCOUNTER — Other Ambulatory Visit: Payer: Self-pay | Admitting: *Deleted

## 2017-04-14 DIAGNOSIS — R590 Localized enlarged lymph nodes: Secondary | ICD-10-CM

## 2017-04-14 DIAGNOSIS — R911 Solitary pulmonary nodule: Secondary | ICD-10-CM

## 2017-04-15 DIAGNOSIS — E1165 Type 2 diabetes mellitus with hyperglycemia: Secondary | ICD-10-CM | POA: Diagnosis not present

## 2017-04-15 DIAGNOSIS — E119 Type 2 diabetes mellitus without complications: Secondary | ICD-10-CM | POA: Diagnosis not present

## 2017-04-15 DIAGNOSIS — J449 Chronic obstructive pulmonary disease, unspecified: Secondary | ICD-10-CM | POA: Diagnosis not present

## 2017-04-15 DIAGNOSIS — K219 Gastro-esophageal reflux disease without esophagitis: Secondary | ICD-10-CM | POA: Diagnosis not present

## 2017-04-15 DIAGNOSIS — R079 Chest pain, unspecified: Secondary | ICD-10-CM | POA: Diagnosis not present

## 2017-04-20 ENCOUNTER — Encounter (HOSPITAL_COMMUNITY): Payer: Self-pay

## 2017-04-20 NOTE — Pre-Procedure Instructions (Addendum)
Angela Lawson  04/20/2017      [id number] [id number]: [phone]: [phone]   Your procedure is scheduled on Thurs. May 17  [facility]  Call this number if you have problems the morning of surgery:  [phone]   Remember:  Do not eat food or drink liquids after midnight on Wed. May 16   Take these medicines the morning of surgery with A SIP OF WATER : bupropion(wellbutrin), claritin, meclizine(antivert), requip, tramadol if needed,  metoprolol(toprol-XL), nitroglycerine if need, omeprazole(prilosec), albuterol (proair) if needed-bring to hospital, duoneb if needed            Stop  advil, motrin, aleve, ibuprofen, BC Powders, Goody's, vitamins/herbal medicines: omega 3, garlic               How to Manage Your Diabetes Before and After Surgery  Why is it important to control my blood sugar before and after surgery? . Improving blood sugar levels before and after surgery helps healing and can limit problems. . A way of improving blood sugar control is eating a healthy diet by: o  Eating less sugar and carbohydrates o  Increasing activity/exercise o  Talking with your doctor about reaching your blood sugar goals . High blood sugars (greater than 180 mg/dL) can raise your risk of infections and slow your recovery, so you will need to focus on controlling your diabetes during the weeks before surgery. . Make sure that the doctor who takes care of your diabetes knows about your planned surgery including the date and location.  How do I manage my blood sugar before surgery? . Check your blood sugar at least 4 times a day, starting 2 days before surgery, to make sure that the level is not too high or low. o Check your blood sugar the morning of your surgery when you wake up and every 2 hours until you get to the Short Stay unit. . If your blood sugar is  less than 70 mg/dL, you will need to treat for low blood sugar: o Do not take insulin. o Treat a low blood sugar (less than 70 mg/dL) with  cup of clear juice (cranberry or apple), 4 glucose tablets, OR glucose gel. o Recheck blood sugar in 15 minutes after treatment (to make sure it is greater than 70 mg/dL). If your blood sugar is not greater than 70 mg/dL on recheck, call [phone] further instructions. . Report your blood sugar to the short stay nurse when you get to Short Stay.  . If you are admitted to the hospital after surgery: o Your blood sugar will be checked by the staff and you will probably be given insulin after surgery (instead of oral diabetes medicines) to make sure you have good blood sugar levels. o The goal for blood sugar control after surgery is 80-180 mg/dL.      WHAT DO I DO ABOUT MY DIABETES MEDICATION?   .Marland KitchenDo not take oral diabetes medicines (pills) the morning of surgery.   . THE NIGHT BEFORE SURGERY, take ______7_____ units of ___lantus________insulin.        Do not wear jewelry, make-up or nail polish.  Do not wear lotions, powders, or perfumes, or deoderant.  Do not shave 48 hours prior to surgery.  Men may shave face and neck.  Do not bring valuables to the  hospital.  [name] is not responsible for any belongings or valuables.  Contacts, dentures or bridgework may not be worn into surgery.  Leave your suitcase in the car.  After surgery it may be brought to your room.  For patients admitted to the hospital, discharge time will be determined by your treatment team.  Patients discharged the day of surgery will not be allowed to drive home.    Special instructions:  [place]- Preparing For Surgery  Before surgery, you can play an important role. Because skin is not sterile, your skin needs to be as free of germs as possible. You can reduce the number of germs on your skin by washing with CHG (chlorahexidine gluconate) Soap before  surgery.  CHG is an antiseptic cleaner which kills germs and bonds with the skin to continue killing germs even after washing.  Please do not use if you have an allergy to CHG or antibacterial soaps. If your skin becomes reddened/irritated stop using the CHG.  Do not shave (including legs and underarms) for at least 48 hours prior to first CHG shower. It is OK to shave your face.  Please follow these instructions carefully.   1. Shower the NIGHT BEFORE SURGERY and the MORNING OF SURGERY with CHG.   2. If you chose to wash your hair, wash your hair first as usual with your normal shampoo.  3. After you shampoo, rinse your hair and body thoroughly to remove the shampoo.  4. Use CHG as you would any other liquid soap. You can apply CHG directly to the skin and wash gently with a scrungie or a clean washcloth.   5. Apply the CHG Soap to your body ONLY FROM THE NECK DOWN.  Do not use on open wounds or open sores. Avoid contact with your eyes, ears, mouth and genitals (private parts). Wash genitals (private parts) with your normal soap.  6. Wash thoroughly, paying special attention to the area where your surgery will be performed.  7. Thoroughly rinse your body with warm water from the neck down.  8. DO NOT shower/wash with your normal soap after using and rinsing off the CHG Soap.  9. Pat yourself dry with a CLEAN TOWEL.   10. Wear CLEAN PAJAMAS   11. Place CLEAN SHEETS on your bed the night of your first shower and DO NOT SLEEP WITH PETS.    Day of Surgery: Do not apply any deodorants/lotions. Please wear clean clothes to the hospital/surgery center.      Please read over the following fact sheets that you were given. Coughing and Deep Breathing

## 2017-04-21 ENCOUNTER — Encounter (HOSPITAL_COMMUNITY)
Admission: RE | Admit: 2017-04-21 | Discharge: 2017-04-21 | Disposition: A | Discharge status: Home / Self Care | Payer: Medicare Other | Source: Ambulatory Visit | Attending: Thoracic Surgery (Cardiothoracic Vascular Surgery) | Admitting: Thoracic Surgery (Cardiothoracic Vascular Surgery)

## 2017-04-21 ENCOUNTER — Encounter (HOSPITAL_COMMUNITY): Payer: Self-pay

## 2017-04-21 ENCOUNTER — Ambulatory Visit (HOSPITAL_COMMUNITY)
Admission: RE | Admit: 2017-04-21 | Discharge: 2017-04-21 | Disposition: A | Discharge status: Home / Self Care | Payer: Medicare Other | Source: Ambulatory Visit | Attending: Thoracic Surgery (Cardiothoracic Vascular Surgery) | Admitting: Thoracic Surgery (Cardiothoracic Vascular Surgery)

## 2017-04-21 DIAGNOSIS — R918 Other nonspecific abnormal finding of lung field: Secondary | ICD-10-CM | POA: Insufficient documentation

## 2017-04-21 DIAGNOSIS — M810 Age-related osteoporosis without current pathological fracture: Secondary | ICD-10-CM | POA: Insufficient documentation

## 2017-04-21 DIAGNOSIS — Z01812 Encounter for preprocedural laboratory examination: Secondary | ICD-10-CM | POA: Insufficient documentation

## 2017-04-21 DIAGNOSIS — R590 Localized enlarged lymph nodes: Secondary | ICD-10-CM | POA: Diagnosis not present

## 2017-04-21 DIAGNOSIS — Z85118 Personal history of other malignant neoplasm of bronchus and lung: Secondary | ICD-10-CM | POA: Insufficient documentation

## 2017-04-21 DIAGNOSIS — I110 Hypertensive heart disease with heart failure: Secondary | ICD-10-CM | POA: Insufficient documentation

## 2017-04-21 DIAGNOSIS — I2510 Atherosclerotic heart disease of native coronary artery without angina pectoris: Secondary | ICD-10-CM | POA: Insufficient documentation

## 2017-04-21 DIAGNOSIS — Z01818 Encounter for other preprocedural examination: Secondary | ICD-10-CM | POA: Diagnosis not present

## 2017-04-21 DIAGNOSIS — J449 Chronic obstructive pulmonary disease, unspecified: Secondary | ICD-10-CM | POA: Diagnosis not present

## 2017-04-21 DIAGNOSIS — I509 Heart failure, unspecified: Secondary | ICD-10-CM | POA: Insufficient documentation

## 2017-04-21 DIAGNOSIS — I700 Atherosclerosis of aorta: Secondary | ICD-10-CM | POA: Diagnosis not present

## 2017-04-21 DIAGNOSIS — Z9981 Dependence on supplemental oxygen: Secondary | ICD-10-CM | POA: Insufficient documentation

## 2017-04-21 DIAGNOSIS — R911 Solitary pulmonary nodule: Secondary | ICD-10-CM

## 2017-04-21 HISTORY — DX: Angina pectoris, unspecified: I20.9

## 2017-04-21 LAB — CBC
HCT: 35.1 % — ABNORMAL LOW (ref 36.0–46.0)
Hemoglobin: 10.9 g/dL — ABNORMAL LOW (ref 12.0–15.0)
MCH: 29.5 pg (ref 26.0–34.0)
MCHC: 31.1 g/dL (ref 30.0–36.0)
MCV: 95.1 fL (ref 78.0–100.0)
Platelets: 194 10*3/uL (ref 150–400)
RBC: 3.69 MIL/uL — ABNORMAL LOW (ref 3.87–5.11)
RDW: 15 % (ref 11.5–15.5)
WBC: 6.7 10*3/uL (ref 4.0–10.5)

## 2017-04-21 LAB — APTT: aPTT: 31 seconds (ref 24–36)

## 2017-04-21 LAB — GLUCOSE, CAPILLARY: Glucose-Capillary: 110 mg/dL — ABNORMAL HIGH (ref 65–99)

## 2017-04-21 LAB — PROTIME-INR
INR: 1.13
Prothrombin Time: 14.5 seconds (ref 11.4–15.2)

## 2017-04-21 LAB — COMPREHENSIVE METABOLIC PANEL
ALT: 16 U/L (ref 14–54)
AST: 20 U/L (ref 15–41)
Albumin: 3.7 g/dL (ref 3.5–5.0)
Alkaline Phosphatase: 54 U/L (ref 38–126)
Anion gap: 7 (ref 5–15)
BUN: 16 mg/dL (ref 6–20)
CO2: 29 mmol/L (ref 22–32)
Calcium: 9.3 mg/dL (ref 8.9–10.3)
Chloride: 103 mmol/L (ref 101–111)
Creatinine, Ser: 1.28 mg/dL — ABNORMAL HIGH (ref 0.44–1.00)
GFR calc Af Amer: 44 mL/min — ABNORMAL LOW (ref >60)
GFR calc non Af Amer: 38 mL/min — ABNORMAL LOW (ref >60)
Glucose, Bld: 111 mg/dL — ABNORMAL HIGH (ref 65–99)
Potassium: 4.5 mmol/L (ref 3.5–5.1)
Sodium: 139 mmol/L (ref 135–145)
Total Bilirubin: 0.3 mg/dL (ref 0.3–1.2)
Total Protein: 6.9 g/dL (ref 6.5–8.1)

## 2017-04-21 NOTE — Progress Notes (Signed)
PCP: Dr. Legrand Rams Cardiologist: Irish Lack  Fasting sugars 90-120  Pt. On oxygen 3 liters/

## 2017-04-21 NOTE — Progress Notes (Signed)
Anesthesia PAT Evaluation: Patient is a 83 year old female scheduled for video bronchoscopy with endobronchial navigation and endobronchial ultrasound on 04/23/17 by Dr. Roxan Hockey. Dx: Bilateral lung nodules, mediastinal adenopathy.  History includes former smoker (quit '07), HTN, DM2, stage 1A [place] lung cancer s/p left lung resection 07/07/08 [facility], [place]), COPD, home oxygen (3L/[place]), NSTEMI/Takotsubo cardiomyopathy (mild CAD) '16, ventricular bigeminy (in the setting of NSTEMI/Takotsubo CM), CHF, CKD, dyslipidemia, Parkinson's disease, SOB, cholecystectomy, hysterectomy, osteoporosis, incisional hernia repair 08/26/13. BMI is consistent with mild obesity. - She was admitted 04/2015 with COPD exacerbation with chest pain and new J point elevation on EKG. Troponin 0.23-->2.61. She underwent cath showing non-obstructive CAD with apical ballooning suggestive of Takotsubo CM (EF 35-40%). She was followed out-patient by cardiologist Dr. Irish Lack, last visit 10/01/16. Her EF had normalized. He recommended continued ARB and b-blocker, but felt she could follow with cardiology on a PRN basis.  - She was admitted at [facility] by Dr. Legrand Rams (her PCP) 04/04/17-04/06/17 for N/V and chest pain and acute renal injury (Cr 2.94, up from ~ 1.5-1.75). Work-up was felt to be negative for acute coronary syndrome. Creatinine improved with hydration.   - PCP is Dr. Legrand Rams, reported last seen last week. - Pulmonologist is Dr. Sinda Du.  - HEM-ONC is Dr. Ancil Linsey, last visit with Robynn Pane, PA-C on 03/09/17.  Meds include albuterol, Fosamax, ASA 81 mg, Lipitor, Wellbutrin SR, Lasix, Neurontin, Duoneb, Lantus, Tradjenta, Claritin, losartan, meclizine, Toprol XL, Nitro, Lovaza, Prilosec, KCl, Requip, tramadol.  BP (!) 130/53   Pulse 69   Resp 20   Ht _0  (1.626 m)   Wt 175 lb 14.4 oz (79.8 kg)   SpO2 96%   BMI 30.19 kg/m  Exam shows a pleasant black female in NAD. She is in a hospital wheelchair. Daughter is by her  side and currently lives with patient. Patient is using O2 3L/[place]. She reports stable DOE. She is able to walk around her house as long as she moves slowly. She sleeps in her bed using 2 pillows. She reports that she was prescribed Nitro during 2016 NSTEMI and has to use about once every six months. She used two Nitro prior to going to the hospital in April. She reports pain occurs at rest--she describes as a "light pain" in the epigastric region, non-radiating. No associated diaphoresis, palpitations, dizziness, jaw/arm pain. She does not have exertional chest pains. She was told pain was likely reflux and given medication (on Prilosec). She takes Lasix daily and an extra dose PRN edema. She took an extra dose a few days ago when her hands felt tight and her weight was up--she had a good response with 5 lb weight loss.   EKG 04/04/17: SR, anteroseptal infarct (age undetermined). Automated interpretation also reads ST elevation, consider inferior injury, lateral leads are also involved. However, there is baseline wanderer which is effecting interpretation. Interpreting physician did not feel tracing was significantly changed since prior. (Troponin negative X 4.)    Cardiac cath 04/17/15:  Prox LAD lesion, 10% stenosed.  Mid Cx lesion, 20% stenosed.  Mid RCA lesion, 30% stenosed.  Dist RCA lesion, 20% stenosed. Moderately severe left ventricular dysfunction with severe hypo-to akinesis involving the mid distal anterolateral wall apex and distal inferior wall in a pattern suggestive of possible Takotsubu cardiomyopathy.  Global ejection fraction is 35-40%.  There is vigorous contractility of the basal walls. Mild nonobstructive CAD with smooth 10% proximal LAD stenosis, 20% proximal circumflex stenosis, and 20-30% distal RCA stenoses.  RECOMMENDATION: Titration of medical therapy for the patient's cardiomyopathy.  The patient has evolved T-wave abnormalities anterolaterally.  Although the pattern is  suggestive of Takotsubu cardiomyopathy, possible transient LAD vasospasm cannot be excluded.  Echo 08/01/15: Impressions: - Mild LVH with LVEF 55-60% and grade 1 diastolic dysfunction. MAC   with trivial mitral regurgitation. Mildly dilated RV with mildly   to moderately decreased contraction. Trivial tricuspid   regurgitation with PASP 25 mmHg.  CT Chest wo contrast 03/25/17: IMPRESSION: - Stable 5.9 cm left lower lobe ground-glass opacity, hypermetabolic on prior PET, worrisome for primary bronchogenic neoplasm such as low-grade adenocarcinoma. - Stable 2.2 cm cavitary lesion in the central right lower lobe with surrounding hypermetabolic nodularity measuring up to 1.8 cm, worrisome for primary bronchogenic neoplasm such as squamous cell carcinoma. - Hypermetabolic mediastinal lymph nodes measuring up to 11 mm short axis, as above. Bilateral hilar regions are hypermetabolic on PET but poorly evaluated on unenhanced CT. - Emphysema and aortic atherosclerosis.  CXR 04/21/17: IMPRESSION: Nodular opacity right lower lobe well seen on recent CT is subtle on chest radiograph. Persistent volume loss with patchy opacity left base, stable. Aortic atherosclerosis. No adenopathy.  PFTs 12/03/12: 1. Spirometry shows a moderate ventilatory defect with evidence of     airflow obstruction. 2. Lung volumes are normal. 3. DLCO is severely reduced, but does correct slightly when volume is     taken into account. 4. Airway resistance is high confirming the presence of airflow     obstruction. 5. There is improvement with inhaled bronchodilator that approaches,     but does not reach significance. 6. Please note the DLCO may be somewhat inaccurate because of a mouth     pressure error.  Preoperative labs noted. Cr 1.28. Glucose 111. H/H 10.9/35.1. PLT 194. PT/PTT WNL.   Above reviewed with anesthesiologist Dr. Conrad [place]. Patient with history of chest pain and Takotsubo CM with minimal CAD in 2016.  EF has since recovered. No exertional chest pain. Work-up negative for ACS during recent admission by PCP Dr. Legrand Rams. Creatinine improved as well since then. On chronic home O2. If no acute changes then it is anticipated that she can proceed as planned.   [facility] Short Stay Center/Anesthesiology Phone [phone] 04/21/2017 3:05 PM

## 2017-04-22 LAB — HEMOGLOBIN A1C
Hgb A1c MFr Bld: 7 % — ABNORMAL HIGH (ref 4.8–5.6)
Mean Plasma Glucose: 154 mg/dL

## 2017-04-23 ENCOUNTER — Ambulatory Visit (HOSPITAL_COMMUNITY): Payer: Medicare Other | Admitting: Vascular Surgery

## 2017-04-23 ENCOUNTER — Ambulatory Visit (HOSPITAL_COMMUNITY)
Admission: RE | Admit: 2017-04-23 | Discharge: 2017-04-23 | Disposition: A | Discharge status: Home / Self Care | Payer: Medicare Other | Source: Ambulatory Visit | Attending: Thoracic Surgery (Cardiothoracic Vascular Surgery) | Admitting: Thoracic Surgery (Cardiothoracic Vascular Surgery)

## 2017-04-23 ENCOUNTER — Encounter (HOSPITAL_COMMUNITY)
Admission: RE | Disposition: A | Discharge status: Home / Self Care | Payer: Self-pay | Source: Ambulatory Visit | Attending: Thoracic Surgery (Cardiothoracic Vascular Surgery)

## 2017-04-23 ENCOUNTER — Encounter (HOSPITAL_COMMUNITY): Payer: Self-pay | Admitting: *Deleted

## 2017-04-23 ENCOUNTER — Ambulatory Visit (HOSPITAL_COMMUNITY): Payer: Medicare Other

## 2017-04-23 ENCOUNTER — Ambulatory Visit (HOSPITAL_COMMUNITY): Payer: Medicare Other | Admitting: Anesthesiology

## 2017-04-23 DIAGNOSIS — Z79899 Other long term (current) drug therapy: Secondary | ICD-10-CM | POA: Insufficient documentation

## 2017-04-23 DIAGNOSIS — J449 Chronic obstructive pulmonary disease, unspecified: Secondary | ICD-10-CM | POA: Diagnosis not present

## 2017-04-23 DIAGNOSIS — C3431 Malignant neoplasm of lower lobe, right bronchus or lung: Secondary | ICD-10-CM | POA: Diagnosis present

## 2017-04-23 DIAGNOSIS — I252 Old myocardial infarction: Secondary | ICD-10-CM | POA: Insufficient documentation

## 2017-04-23 DIAGNOSIS — R079 Chest pain, unspecified: Secondary | ICD-10-CM | POA: Diagnosis not present

## 2017-04-23 DIAGNOSIS — R911 Solitary pulmonary nodule: Secondary | ICD-10-CM | POA: Diagnosis not present

## 2017-04-23 DIAGNOSIS — G20 Parkinson's disease: Secondary | ICD-10-CM | POA: Diagnosis not present

## 2017-04-23 DIAGNOSIS — E119 Type 2 diabetes mellitus without complications: Secondary | ICD-10-CM | POA: Insufficient documentation

## 2017-04-23 DIAGNOSIS — Z9981 Dependence on supplemental oxygen: Secondary | ICD-10-CM | POA: Insufficient documentation

## 2017-04-23 DIAGNOSIS — J322 Chronic ethmoidal sinusitis: Secondary | ICD-10-CM | POA: Diagnosis not present

## 2017-04-23 DIAGNOSIS — R590 Localized enlarged lymph nodes: Secondary | ICD-10-CM | POA: Diagnosis not present

## 2017-04-23 DIAGNOSIS — I509 Heart failure, unspecified: Secondary | ICD-10-CM | POA: Diagnosis not present

## 2017-04-23 DIAGNOSIS — E785 Hyperlipidemia, unspecified: Secondary | ICD-10-CM | POA: Insufficient documentation

## 2017-04-23 DIAGNOSIS — I2510 Atherosclerotic heart disease of native coronary artery without angina pectoris: Secondary | ICD-10-CM | POA: Diagnosis not present

## 2017-04-23 DIAGNOSIS — C3412 Malignant neoplasm of upper lobe, left bronchus or lung: Secondary | ICD-10-CM | POA: Diagnosis not present

## 2017-04-23 DIAGNOSIS — Z87891 Personal history of nicotine dependence: Secondary | ICD-10-CM | POA: Insufficient documentation

## 2017-04-23 DIAGNOSIS — Z7982 Long term (current) use of aspirin: Secondary | ICD-10-CM | POA: Diagnosis not present

## 2017-04-23 DIAGNOSIS — I110 Hypertensive heart disease with heart failure: Secondary | ICD-10-CM | POA: Diagnosis not present

## 2017-04-23 DIAGNOSIS — Z955 Presence of coronary angioplasty implant and graft: Secondary | ICD-10-CM | POA: Insufficient documentation

## 2017-04-23 DIAGNOSIS — Z419 Encounter for procedure for purposes other than remedying health state, unspecified: Secondary | ICD-10-CM

## 2017-04-23 DIAGNOSIS — Z794 Long term (current) use of insulin: Secondary | ICD-10-CM | POA: Insufficient documentation

## 2017-04-23 HISTORY — PX: VIDEO BRONCHOSCOPY WITH ENDOBRONCHIAL NAVIGATION: SHX6175

## 2017-04-23 HISTORY — PX: VIDEO BRONCHOSCOPY WITH ENDOBRONCHIAL ULTRASOUND: SHX6177

## 2017-04-23 LAB — GLUCOSE, CAPILLARY
Glucose-Capillary: 137 mg/dL — ABNORMAL HIGH (ref 65–99)
Glucose-Capillary: 93 mg/dL (ref 65–99)

## 2017-04-23 SURGERY — VIDEO BRONCHOSCOPY WITH ENDOBRONCHIAL NAVIGATION
Anesthesia: General

## 2017-04-23 MED ORDER — 0.9 % SODIUM CHLORIDE (POUR BTL) OPTIME
TOPICAL | Status: DC | PRN
  Administered 2017-04-23: 1000 mL

## 2017-04-23 MED ORDER — SODIUM CHLORIDE 0.9 % IV SOLN
INTRAVENOUS | Status: DC | PRN
  Administered 2017-04-23: 25 ug/min via INTRAVENOUS

## 2017-04-23 MED ORDER — ROCURONIUM BROMIDE 100 MG/10ML IV SOLN
INTRAVENOUS | Status: DC | PRN
  Administered 2017-04-23: 30 mg via INTRAVENOUS
  Administered 2017-04-23: 50 mg via INTRAVENOUS

## 2017-04-23 MED ORDER — FENTANYL CITRATE (PF) 250 MCG/5ML IJ SOLN
INTRAMUSCULAR | Status: AC
  Filled 2017-04-23: qty 5

## 2017-04-23 MED ORDER — EPINEPHRINE PF 1 MG/ML IJ SOLN
INTRAMUSCULAR | Status: AC
  Filled 2017-04-23: qty 2

## 2017-04-23 MED ORDER — OXYCODONE HCL 5 MG/5ML PO SOLN: 5.0000 mg | Freq: Once | ORAL | Status: DC | PRN

## 2017-04-23 MED ORDER — ONDANSETRON HCL 4 MG/2ML IJ SOLN
INTRAMUSCULAR | Status: DC | PRN
  Administered 2017-04-23: 4 mg via INTRAVENOUS

## 2017-04-23 MED ORDER — LACTATED RINGERS IV SOLN
INTRAVENOUS | Status: DC
  Administered 2017-04-23: 09:00:00 via INTRAVENOUS

## 2017-04-23 MED ORDER — SUGAMMADEX SODIUM 200 MG/2ML IV SOLN
INTRAVENOUS | Status: DC | PRN
  Administered 2017-04-23: 200 mg via INTRAVENOUS

## 2017-04-23 MED ORDER — FENTANYL CITRATE (PF) 100 MCG/2ML IJ SOLN
25.0000 ug | INTRAMUSCULAR | Status: DC | PRN
Start: 2017-04-23 — End: 2017-04-23

## 2017-04-23 MED ORDER — OXYCODONE HCL 5 MG PO TABS: 5.0000 mg | ORAL_TABLET | Freq: Once | ORAL | Status: DC | PRN

## 2017-04-23 MED ORDER — FENTANYL CITRATE (PF) 100 MCG/2ML IJ SOLN
INTRAMUSCULAR | Status: DC | PRN
  Administered 2017-04-23: 75 ug via INTRAVENOUS
  Administered 2017-04-23 (×2): 25 ug via INTRAVENOUS

## 2017-04-23 MED ORDER — LIDOCAINE 2% (20 MG/ML) 5 ML SYRINGE
INTRAMUSCULAR | Status: AC
  Filled 2017-04-23: qty 5

## 2017-04-23 MED ORDER — ROCURONIUM BROMIDE 10 MG/ML (PF) SYRINGE
PREFILLED_SYRINGE | INTRAVENOUS | Status: AC
  Filled 2017-04-23: qty 5

## 2017-04-23 MED ORDER — LIDOCAINE HCL (CARDIAC) 20 MG/ML IV SOLN
INTRAVENOUS | Status: DC | PRN
  Administered 2017-04-23: 70 mg via INTRAVENOUS

## 2017-04-23 MED ORDER — PROPOFOL 10 MG/ML IV BOLUS
INTRAVENOUS | Status: AC
  Filled 2017-04-23: qty 20

## 2017-04-23 MED ORDER — ONDANSETRON HCL 4 MG/2ML IJ SOLN
INTRAMUSCULAR | Status: AC
  Filled 2017-04-23: qty 2

## 2017-04-23 MED ORDER — EPINEPHRINE PF 1 MG/ML IJ SOLN
INTRAMUSCULAR | Status: DC | PRN
  Administered 2017-04-23: 1 mg via ENDOTRACHEOPULMONARY

## 2017-04-23 MED ORDER — PROPOFOL 10 MG/ML IV BOLUS
INTRAVENOUS | Status: DC | PRN
  Administered 2017-04-23: 100 mg via INTRAVENOUS

## 2017-04-23 SURGICAL SUPPLY — 49 items
ADAPTER BRONCH F/PENTAX (ADAPTER) ×2 IMPLANT
BRUSH BIOPSY BRONCH 10 SDTNB (MISCELLANEOUS) ×2 IMPLANT
BRUSH CYTOL CELLEBRITY 1.5X140 (MISCELLANEOUS) IMPLANT
BRUSH SUPERTRAX BIOPSY (INSTRUMENTS) IMPLANT
BRUSH SUPERTRAX NDL-TIP CYTO (INSTRUMENTS) ×4 IMPLANT
CANISTER SUCT 3000ML PPV (MISCELLANEOUS) ×4 IMPLANT
CHANNEL WORK EXTEND EDGE 180 (KITS) IMPLANT
CHANNEL WORK EXTEND EDGE 45 (KITS) IMPLANT
CHANNEL WORK EXTEND EDGE 90 (KITS) IMPLANT
CONT SPEC 4OZ CLIKSEAL STRL BL (MISCELLANEOUS) ×10 IMPLANT
COTTONBALL LRG STERILE PKG (GAUZE/BANDAGES/DRESSINGS) IMPLANT
COVER BACK TABLE 60X90IN (DRAPES) ×4 IMPLANT
COVER DOME SNAP 22 D (MISCELLANEOUS) ×2 IMPLANT
FACESHIELD STD STERILE (MASK) ×2 IMPLANT
FILTER STRAW FLUID ASPIR (MISCELLANEOUS) ×2 IMPLANT
FORCEPS BIOP RJ4 1.8 (CUTTING FORCEPS) IMPLANT
FORCEPS BIOP SUPERTRX PREMAR (INSTRUMENTS) ×2 IMPLANT
GAUZE SPONGE 4X4 12PLY STRL (GAUZE/BANDAGES/DRESSINGS) ×2 IMPLANT
GLOVE SURG SIGNA 7.5 PF LTX (GLOVE) ×4 IMPLANT
GLOVE SURG SS PI 7.0 STRL IVOR (GLOVE) ×4 IMPLANT
GOWN STRL REUS W/ TWL LRG LVL3 (GOWN DISPOSABLE) ×1 IMPLANT
GOWN STRL REUS W/ TWL XL LVL3 (GOWN DISPOSABLE) ×2 IMPLANT
GOWN STRL REUS W/TWL LRG LVL3 (GOWN DISPOSABLE) ×1
GOWN STRL REUS W/TWL XL LVL3 (GOWN DISPOSABLE) ×2
KIT CLEAN ENDO COMPLIANCE (KITS) ×6 IMPLANT
KIT PROCEDURE EDGE 180 (KITS) ×2 IMPLANT
KIT PROCEDURE EDGE 45 (KITS) IMPLANT
KIT PROCEDURE EDGE 90 (KITS) IMPLANT
KIT ROOM TURNOVER OR (KITS) ×4 IMPLANT
MARKER SKIN DUAL TIP RULER LAB (MISCELLANEOUS) ×4 IMPLANT
NEEDLE 22X1 1/2 (OR ONLY) (NEEDLE) IMPLANT
NEEDLE BLUNT 18X1 FOR OR ONLY (NEEDLE) IMPLANT
NEEDLE EBUS SONO TIP PENTAX (NEEDLE) ×2 IMPLANT
NEEDLE SUPERTRX PREMARK BIOPSY (NEEDLE) ×2 IMPLANT
NS IRRIG 1000ML POUR BTL (IV SOLUTION) ×4 IMPLANT
OIL SILICONE PENTAX (PARTS (SERVICE/REPAIRS)) ×4 IMPLANT
PAD ARMBOARD 7.5X6 YLW CONV (MISCELLANEOUS) ×8 IMPLANT
PATCHES PATIENT (LABEL) ×6 IMPLANT
SYR 20CC LL (SYRINGE) ×4 IMPLANT
SYR 20ML ECCENTRIC (SYRINGE) ×6 IMPLANT
SYR 30ML LL (SYRINGE) ×2 IMPLANT
SYR 5ML LL (SYRINGE) ×4 IMPLANT
SYR 5ML LUER SLIP (SYRINGE) ×4 IMPLANT
SYR CONTROL 10ML LL (SYRINGE) IMPLANT
TOWEL OR 17X24 6PK STRL BLUE (TOWEL DISPOSABLE) ×4 IMPLANT
TRAP SPECIMEN MUCOUS 40CC (MISCELLANEOUS) ×4 IMPLANT
TUBE CONNECTING 20X1/4 (TUBING) ×6 IMPLANT
UNDERPAD 30X30 (UNDERPADS AND DIAPERS) ×2 IMPLANT
WATER STERILE IRR 1000ML POUR (IV SOLUTION) ×4 IMPLANT

## 2017-04-23 NOTE — Brief Op Note (Signed)
04/23/2017  12:03 PM  PATIENT:  Angela Lawson  83 y.o. female  PRE-OPERATIVE DIAGNOSIS:  BILATERAL LUNG NODULES MEDIASTINAL ADENOPATHY  POST-OPERATIVE DIAGNOSIS:  BILATERAL LUNG NODULES MEDIASTINAL ADENOPATHY- NON SMALL CELL CARCINOMA  PROCEDURE:  Procedure(s): VIDEO BRONCHOSCOPY WITH ENDOBRONCHIAL NAVIGATION (N/A) VIDEO BRONCHOSCOPY WITH ENDOBRONCHIAL ULTRASOUND (N/A)  SURGEON:  Surgeon(s) and Role:    * Melrose Nakayama, MD - Primary  PHYSICIAN ASSISTANT:   ASSISTANTS: none   ANESTHESIA:   general  EBL:  Total I/O In: -  Out: 5 [Blood:5]  BLOOD ADMINISTERED:none  DRAINS: none   LOCAL MEDICATIONS USED:  NONE  SPECIMEN:  Source of Specimen:  Lymph nodes, LUL, RLL nodules  DISPOSITION OF SPECIMEN:  PATHOLOGY  PLAN OF CARE: Discharge to home after PACU  PATIENT DISPOSITION:  PACU - hemodynamically stable.   Delay start of Pharmacological VTE agent (>24hrs) due to surgical blood loss or risk of bleeding: not applicable

## 2017-04-23 NOTE — Anesthesia Procedure Notes (Signed)
Procedure Name: Intubation Date/Time: 04/23/2017 10:02 AM Performed by: Mariea Clonts Pre-anesthesia Checklist: Patient identified, Emergency Drugs available, Suction available and Patient being monitored Patient Re-evaluated:Patient Re-evaluated prior to inductionOxygen Delivery Method: Circle System Utilized Preoxygenation: Pre-oxygenation with 100% oxygen Intubation Type: IV induction Ventilation: Mask ventilation without difficulty Laryngoscope Size: Miller and 2 Grade View: Grade I Tube type: Oral Number of attempts: 1 Airway Equipment and Method: Stylet and Oral airway Placement Confirmation: ETT inserted through vocal cords under direct vision,  positive ETCO2 and breath sounds checked- equal and bilateral Secured at: 21 cm Tube secured with: Tape Dental Injury: Teeth and Oropharynx as per pre-operative assessment

## 2017-04-23 NOTE — Transfer of Care (Signed)
Immediate Anesthesia Transfer of Care Note  Patient: Angela Lawson  Procedure(s) Performed: Procedure(s): VIDEO BRONCHOSCOPY WITH ENDOBRONCHIAL NAVIGATION (N/A) VIDEO BRONCHOSCOPY WITH ENDOBRONCHIAL ULTRASOUND (N/A)  Patient Location: PACU  Anesthesia Type:General  Level of Consciousness: awake, alert  and oriented  Airway & Oxygen Therapy: Patient Spontanous Breathing and Patient connected to nasal cannula oxygen  Post-op Assessment: Report given to RN, Post -op Vital signs reviewed and stable and Patient moving all extremities X 4  Post vital signs: Reviewed and stable  Last Vitals:  Vitals:   04/23/17 0752 04/23/17 1204  BP: (!) 157/47 135/67  Pulse: 73 75  Resp: 18 15  Temp: 36.8 C 36.3 C    Last Pain:  Vitals:   04/23/17 1204  TempSrc:   PainSc: 0-No pain         Complications: No apparent anesthesia complications

## 2017-04-23 NOTE — Anesthesia Preprocedure Evaluation (Signed)
Anesthesia Evaluation  Patient identified by MRN, date of birth, ID band Patient awake    Reviewed: Allergy & Precautions, H&P , NPO status , Patient's Chart, lab work & pertinent test results  History of Anesthesia Complications Negative for: history of anesthetic complications  Airway Mallampati: III  TM Distance: >3 FB Neck ROM: Full    Dental  (+) Edentulous Upper, Edentulous Lower   Pulmonary shortness of breath and at rest, COPD (emphysema, s/p LL Lobectomy for Cancer),  COPD inhaler and oxygen dependent, former smoker,    breath sounds clear to auscultation       Cardiovascular hypertension, Pt. on medications (-) angina+ CAD, + Past MI and +CHF   Rhythm:Regular Rate:Normal     Neuro/Psych negative neurological ROS  negative psych ROS   GI/Hepatic Neg liver ROS, neg GERD  ,  Endo/Other  diabetes, Type 2, Oral Hypoglycemic Agents  Renal/GU Renal disease     Musculoskeletal  (+) Arthritis ,   Abdominal   Peds  Hematology   Anesthesia Other Findings   Reproductive/Obstetrics                             Anesthesia Physical Anesthesia Plan  ASA: III  Anesthesia Plan: General   Post-op Pain Management:    Induction: Intravenous  Airway Management Planned: Oral ETT  Additional Equipment: None  Intra-op Plan:   Post-operative Plan: Extubation in OR  Informed Consent: I have reviewed the patients History and Physical, chart, labs and discussed the procedure including the risks, benefits and alternatives for the proposed anesthesia with the patient or authorized representative who has indicated his/her understanding and acceptance.   Dental advisory given  Plan Discussed with: CRNA and Surgeon  Anesthesia Plan Comments:         Anesthesia Quick Evaluation

## 2017-04-23 NOTE — Interval H&P Note (Signed)
History and Physical Interval Note:  04/23/2017 9:33 AM  Angela Lawson  has presented today for surgery, with the diagnosis of BILATERAL LUNG NODULES MEDIASTINAL ADENOPATHY  The various methods of treatment have been discussed with the patient and family. After consideration of risks, benefits and other options for treatment, the patient has consented to  Procedure(s): VIDEO BRONCHOSCOPY WITH ENDOBRONCHIAL NAVIGATION (N/A) VIDEO BRONCHOSCOPY WITH ENDOBRONCHIAL ULTRASOUND (N/A) as a surgical intervention .  The patient's history has been reviewed, patient examined, no change in status, stable for surgery.  I have reviewed the patient's chart and labs.  Questions were answered to the patient's satisfaction.     Melrose Nakayama

## 2017-04-23 NOTE — Discharge Instructions (Addendum)
Do not drive or engage in heavy physical activity for 24 hours  You may resume normal activities tomorrow  You may use an over the counter cough medication or throat lozenge if needed. You amy use acetaminophen (Tylenol) for discomfort if needed  You may cough up small amounts of blood over the next few days.  Call [phone] if you develop chest pain, shortness of breath, fever > 101F or cough up more than 2 tablespoons of blood  Contact Kirby Crigler at the [facility] for follow up

## 2017-04-23 NOTE — H&P (View-Only) (Signed)
PCP is Rosita Fire, MD Referring Provider is Holley Bouche, NP  Chief Complaint  Patient presents with  . Lung Lesion    Surgical eval, Chest CT 03/25/2017, HX of lung cancer, PET Scan 01/21/17 ...s/p RES. LULobe 2009 in [place]    HPI: Angela Lawson is an 83 year old woman sent for consultation regarding bilateral lung abnormalities and mediastinal adenopathy.  Angela Lawson is an 83 year old woman with history of a lung resection for adenocarcinoma in 2009. Past medical history significant for tobacco abuse (quit in 2009), COPD on home oxygen at 3 L nasal cannula, CHF, hypertension, non-ST elevation MI, Takotsubo cardiomyopathy, type 2 diabetes, Parkinson's disease, arthritis, back pain, and osteoporosis.  She has been followed with CT scans since her lung cancer resection in 2009. She had a CT in January which showed some progression of a groundglass opacity in the left lower lobe and nodular opacities around a cavity in the right lower lobe. A PET CT was done which showed these areas were hypermetabolic and also showed some hypermetabolicon adenopathy. She was recovering from his COPD flare so recommendation was to repeat her CT. That was done recently and showed further progression particularly of the right lower lobe nodules. There is no significant change of the mediastinal adenopathy.  She lives at home. She is on 3 L nasal cannula home oxygen. She gets short of breath with walking very short distances she sometimes short of breath when she lies flat on her back and sometimes at rest. She complains of decreased energy and loss of appetite. She denies any weight loss. She denies unusual headaches or visual changes. She does have frequent heartburn, arthritis, and difficulty walking. She denies any recent change in her respiratory status.  Zubrod Score: At the time of surgery this patient's most appropriate activity status/level should be described as: '[]'$     0    Normal activity, no  symptoms '[]'$     1    Restricted in physical strenuous activity but ambulatory, able to do out light work '[]'$     2    Ambulatory and capable of self care, unable to do work activities, up and about >50 % of waking hours                              '[x]'$     3    Only limited self care, in bed greater than 50% of waking hours '[]'$     4    Completely disabled, no self care, confined to bed or chair '[]'$     5    Moribund   Past Medical History:  Diagnosis Date  . Adenocarcinoma of lung ([place])    Left lung 2009, resected  . Arthritis   . Back pain   . CHF (congestive heart failure) ([place])   . COPD (chronic obstructive pulmonary disease) ([place])   . Diverticulitis   . Dyslipidemia   . Essential hypertension   . H/O ventral hernia   . Non-obstructive CAD    a. 04/2015 NSTEMI/Cath: LAD 10p, LCX 29m RCA 325m20d, EF 35-40 w/ apical ballooning.  . On home O2    3L N/C  . Osteoporosis   . Osteoporosis 11/04/2015   Managed by Dr. FaLegrand Rams . [place]  . Shortness of breath   . Takotsubo cardiomyopathy    a. 04/2015 Echo: EF 45-50%, mid-dist anterior/apical/inferoapical HK w/ hyperdynamic base. Gr 1 DD, mild AI, mild-mod MR,  triv TR, PASP 45mHg;  b. 04/2015 LV gram: Ef 35-40% w/ apical ballooning.  . Type II diabetes mellitus ([facility]   . Ventricular bigeminy    a. 04/2015 in setting of NSTEMI/Takotsubo.    Past Surgical History:  Procedure Laterality Date  . ABDOMINAL HYSTERECTOMY    . CARDIAC CATHETERIZATION N/A 04/17/2015   Procedure: Left Heart Cath and Coronary Angiography;  Surgeon: TTroy Sine MD;  [facility];  Service: Cardiovascular;  Laterality: N/A;  . CHOLECYSTECTOMY    . COLONOSCOPY N/A 09/18/2014   Procedure: COLONOSCOPY;  Surgeon: SDanie Binder MD;  Location[facility];  Service: Endoscopy;  Laterality: N/A;  8:30 AM - moved to 10:30 -Rosendo Grosto notify pt  . ECTOPIC PREGNANCY SURGERY    . INCISIONAL HERNIA REPAIR N/A 08/26/2013   Procedure:  HERNIA REPAIR INCISIONAL WITH MESH;  Surgeon: MJamesetta So MD;  Location: [facility];  Service: General;  Laterality: N/A;  . LUNG CANCER SURGERY      Family History  Problem Relation Age of Onset  . Diabetes Mother   . Hypertension Mother   . Diabetes Father   . Asthma    . Cancer    . Heart attack Sister     X2  . Colon cancer Neg Hx     Social History Social History  Substance Use Topics  . Smoking status: Former Smoker    Years: 20.00    Types: Cigarettes    Quit date: 08/13/2006  . Smokeless tobacco: Never Used  . Alcohol use No    Current Outpatient Prescriptions  Medication Sig Dispense Refill  . albuterol (PROAIR HFA) 108 (90 BASE) MCG/ACT inhaler Inhale 2 puffs into the lungs every 4 (four) hours as needed for wheezing or shortness of breath.    .Marland Kitchenalendronate (FOSAMAX) 70 MG tablet Take 70 mg by mouth every Friday.     .Marland Kitchenaspirin EC 81 MG tablet Take 81 mg by mouth daily.    .Marland Kitchenatorvastatin (LIPITOR) 40 MG tablet Take 40 mg by mouth every evening.    .Marland KitchenbuPROPion (WELLBUTRIN SR) 150 MG 12 hr tablet Take 150 mg by mouth 2 (two) times daily.    . cholecalciferol (VITAMIN D) 1000 units tablet Take 1,000 Units by mouth daily.    . furosemide (LASIX) 40 MG tablet Take 1 tablet by mouth daily, take 1 extra pill daily as needed for weight increase of 3 lbs in a day or 5 lbs in a week (Patient taking differently: Take 40 mg by mouth daily. Pt takes one extra tablet daily for weight gain of 3lbs in one day or 5lbs in one week.) 45 tablet 3  . gabapentin (NEURONTIN) 300 MG capsule Take 600 mg by mouth at bedtime.     . Garlic 3093MG TABS Take 300 mg by mouth daily.    .Marland Kitchenipratropium-albuterol (DUONEB) 0.5-2.5 (3) MG/3ML SOLN Inhale 3 mLs into the lungs every 6 (six) hours as needed (for wheezing/shortness of breath).     .Marland KitchenLANTUS SOLOSTAR 100 UNIT/ML Solostar Pen Inject 15 Units into the skin at bedtime.     .Marland Kitchenlinagliptin (TRADJENTA) 5 MG TABS tablet Take 5 mg by mouth daily.    .Marland Kitchen loratadine (CLARITIN) 10 MG tablet Take 10 mg by mouth daily.  3  . losartan (COZAAR) 50 MG tablet Take 50 mg by mouth daily.  2  . meclizine (ANTIVERT) 25 MG tablet Take 25 mg by mouth daily.     .Marland Kitchen  metoprolol succinate (TOPROL-XL) 25 MG 24 hr tablet Take 1 tablet (25 mg total) by mouth daily. 90 tablet 3  . nitroGLYCERIN (NITROSTAT) 0.4 MG SL tablet Place 0.4 mg under the tongue every 5 (five) minutes as needed for chest pain.    Marland Kitchen omega-3 acid ethyl esters (LOVAZA) 1 g capsule Take 1 g by mouth 2 (two) times daily.    Marland Kitchen omeprazole (PRILOSEC) 20 MG capsule Take 1 capsule (20 mg total) by mouth daily. 60 capsule 3  . potassium chloride (K-DUR) 10 MEQ tablet Take 1 tablet (10 mEq total) by mouth daily. 90 tablet 3  . pyridoxine (B-6) 100 MG tablet Take 100 mg by mouth daily.     Marland Kitchen rOPINIRole (REQUIP) 0.25 MG tablet Take 0.25 mg by mouth 3 (three) times daily.     . sodium chloride (OCEAN) 0.65 % SOLN nasal spray Place 1 spray into both nostrils every 3 (three) hours as needed for congestion.     . traMADol (ULTRAM) 50 MG tablet Take 50 mg by mouth every 12 (twelve) hours as needed for moderate pain.      No current facility-administered medications for this visit.     Allergies  Allergen Reactions  . Lisinopril Cough    Review of Systems  Constitutional: Positive for appetite change.  HENT: Positive for hearing loss. Negative for trouble swallowing and voice change.   Eyes: Negative for visual disturbance.  Respiratory: Positive for cough and shortness of breath.   Cardiovascular: Negative for chest pain.  Gastrointestinal: Positive for abdominal pain. Negative for abdominal distention.  Genitourinary: Negative for difficulty urinating and dysuria.  Musculoskeletal: Positive for arthralgias, back pain and gait problem.  Neurological: Negative for syncope, weakness and headaches.  Hematological: Negative for adenopathy. Does not bruise/bleed easily.    BP (!) 122/59 (BP Location: Left  Arm, Patient Position: Sitting, Cuff Size: Large)   Pulse 83   Resp 18   Ht '5\' 4"'$  (1.626 m)   Wt 173 lb (78.5 kg)   BMI 29.70 kg/m  Physical Exam  Constitutional: She is oriented to person, place, and time. No distress.  Elderly frail-appearing woman  HENT:  Head: Normocephalic and atraumatic.  Mouth/Throat: No oropharyngeal exudate.  Eyes: Conjunctivae and EOM are normal. No scleral icterus.  Neck: Neck supple. No thyromegaly present.  Cardiovascular: Normal rate, regular rhythm and normal heart sounds.   No murmur heard. Pulmonary/Chest: Effort normal. No respiratory distress. She has wheezes (Faint).  Diminished breath sounds bilaterally  Abdominal: Soft. She exhibits no distension. There is no tenderness.  Musculoskeletal: She exhibits no edema.  Lymphadenopathy:    She has no cervical adenopathy.  Neurological: She is alert and oriented to person, place, and time. No cranial nerve deficit.  No focal motor deficit  Skin: Skin is warm and dry.  Psychiatric: She has a normal mood and affect.  Vitals reviewed.    Diagnostic Tests: NUCLEAR MEDICINE PET SKULL BASE TO THIGH  TECHNIQUE: 9.0 mCi F-18 FDG was injected intravenously. Full-ring PET imaging was performed from the skull base to thigh after the radiotracer. CT data was obtained and used for attenuation correction and anatomic localization.  FASTING BLOOD GLUCOSE:  Value: 145 mg/dl  COMPARISON:  Multiple exams, including 03/16/2014  FINDINGS: NECK  No hypermetabolic lymph nodes in the neck.  Chronic ethmoid sinusitis.  CHEST  Newly hypermetabolic paratracheal, prevascular, AP window, bilateral hilar, and periaortic adenopathy in the thorax purely fairly stable low-grade metabolic activity in a left internal mammary lymph  node. An upper right paratracheal node measuring 1.1 cm in short axis on image 48/4 has a maximum SUV of 8.2 and the other mediastinal lymph nodes are similar.  Progressive  hypermetabolic ground-glass opacity posteriorly in the left lower lobe, maximum SUV 9.1  Asymmetric right apical scarring without significant hypermetabolic activity.  Low-grade hypermetabolic activity along ground-glass density posteriorly in the right upper lobe  Clustered nodular opacity in the right lower lobe with low-grade metabolic activity, maximum SUV 3.0.  Underlying emphysema noted.  The 6 by 4 mm subpleural nodule in the left upper lobe on image 29/ 8 is not perceptibly hypermetabolic.  ABDOMEN/PELVIS  No abnormal hypermetabolic activity within the liver, pancreas, adrenal glands, or spleen. No hypermetabolic lymph nodes in the abdomen or pelvis.  Cholecystectomy. Scattered diverticula in the colon with sigmoid colon diverticulosis. Physiologic activity in the bowel.  SKELETON  Severe left and moderate to severe right degenerative hip arthropathy. No compelling findings of osseous metastatic disease.  IMPRESSION: 1. New hypermetabolic adenopathy in the mediastinum and hila, with progressive hypermetabolic ground-glass density posteriorly in the left lower lobe with faint nodular components, and some low-grade hypermetabolic activity in ground-glass opacity dependently in the right upper lobe. Although the appearance is concerning for malignancy potentially with a lymphangitic component in the left lower lobe, the unusual configuration in the appearance of the ground-glass opacities could conceivably represent an atypical granulomatous infectious process as an alternative. This is not classic obvious recurrence with well-defined mass or nodules. 2. There is some ill-defined clustered nodularity in the right lower lobe only faintly metabolic, maximum SUV 3.0. 3. Overall I believe that this process require surveillance to differentiate an inflammatory/infectious condition from recurrent malignancy. The adenopathy is certainly concerning. 4. No  findings of malignancy in the neck, abdomen/pelvis, or skeleton. 5. Sigmoid colon diverticulosis. 6. Emphysema. 7. Chronic ethmoid sinusitis.   Electronically Signed   By: Van Clines M.D.   On: 01/21/2017 13:49  CT CHEST WITHOUT CONTRAST  TECHNIQUE: Multidetector CT imaging of the chest was performed following the standard protocol without IV contrast.  COMPARISON:  PET-CT dated 01/21/2017.  CT chest dated 01/07/2017.  FINDINGS: Cardiovascular: The heart is normal in size. No pericardial effusion.  Three vessel coronary atherosclerosis.  No evidence of thoracic aortic aneurysm. Atherosclerotic calcifications of the aortic arch.  Mediastinum/Nodes: Hypermetabolic mediastinal lymph nodes, including:  --6 mm short axis left paratracheal node (series 2/ image 32)  --10 mm short axis high right paratracheal node (series 2/ image 38)  --8 mm short axis prevascular node (series 2/ image 53)  --11 mm short axis low right paratracheal node (series 2/image 54)  Bilateral hilar regions are hypermetabolic on PET, but poorly evaluated on unenhanced CT.  Visualized thyroid is grossly unremarkable.  Lungs/Pleura: Stable ground-glass opacity with lepidic growth pattern in the left lower lobe, measuring approximately 5.9 x 5.5 cm (series 6/image 91), hypermetabolic on prior PET. Overall, this appearance is worrisome for low-grade adenocarcinoma.  2.2 cm cavitary lesion in the central right lower lobe with surrounding hypermetabolic nodularity measuring up to 18 mm (series 6/ image 102), worrisome for cavitary neoplasm such as squamous cell carcinoma.  Underlying moderate centrilobular emphysematous changes, upper lobe predominant.  Right apical pleural-parenchymal scarring.  No pleural effusion or pneumothorax.  Upper Abdomen: Visualized upper abdomen is grossly unremarkable.  Musculoskeletal: Degenerative changes of the  visualized thoracolumbar spine.  IMPRESSION: Stable 5.9 cm left lower lobe ground-glass opacity, hypermetabolic on prior PET, worrisome for primary bronchogenic neoplasm such as  low-grade adenocarcinoma.  Stable 2.2 cm cavitary lesion in the central right lower lobe with surrounding hypermetabolic nodularity measuring up to 1.8 cm, worrisome for primary bronchogenic neoplasm such as squamous cell carcinoma.  Hypermetabolic mediastinal lymph nodes measuring up to 11 mm short axis, as above. Bilateral hilar regions are hypermetabolic on PET but poorly evaluated on unenhanced CT.  Emphysema and aortic atherosclerosis.   Electronically Signed   By: Julian Hy M.D.   On: 03/25/2017 12:55 I personally reviewed the CT and PET/CT and concur with the findings noted above  Impression: Angela Lawson is an 83 year old woman with a history of lung cancer who has bilateral pulmonary abnormalities and mediastinal adenopathy. The left lower lobe she has a progressive groundglass opacity. In the right lower lobe there are on multiple nodules surrounding a cavity. She also has mediastinal adenopathy. Differential diagnosis is primarily recurrent cancer versus infectious etiology (granulomatous disease).  I discussed the possible diagnoses with Angela Lawson and her daughters. I discussed potential treatments which would be available to her. I wanted to be sure that she would be willing to undergo treatment before we went down the path of doing invasive procedures. She would wish to do so.  I recommended navigational bronchoscopy and endobronchial ultrasound. That will allow [place] to assess both lung abnormalities and the mediastinal nodes at the same setting. She her daughters understand this will be done in the operating room, under general anesthesia, via an endoscopic approach, and on an outpatient basis. I reviewed the indications, risks, benefits, and alternatives. They understand there is no  guarantee of the definitive diagnosis. They understand this is strictly a diagnostic and not therapeutic. They understand the risk include, but are not limited to death, MI, DVT, PE, bleeding, stroke, pneumothorax, as well as the possibility of other unforeseeable complications.  She wishes to talk with her son before she makes a final decision as to whether to proceed. She will call our office to schedule or if she would like to come back and talk more before making a decision.  Plan: Navigational bronchoscopy and endobronchial ultrasound. Patient will call and schedule.  Melrose Nakayama, MD [facility] [phone]

## 2017-04-24 ENCOUNTER — Ambulatory Visit (HOSPITAL_COMMUNITY): Payer: Medicare Other | Admitting: Adult Health

## 2017-04-24 ENCOUNTER — Encounter (HOSPITAL_COMMUNITY): Payer: Self-pay | Admitting: Thoracic Surgery (Cardiothoracic Vascular Surgery)

## 2017-04-24 DIAGNOSIS — J449 Chronic obstructive pulmonary disease, unspecified: Secondary | ICD-10-CM | POA: Diagnosis not present

## 2017-04-24 LAB — ACID FAST SMEAR (AFB, MYCOBACTERIA): Acid Fast Smear: NEGATIVE

## 2017-04-24 NOTE — Op Note (Signed)
NAME:  Angela Lawson, Angela Lawson                      ACCOUNT NO.:  MEDICAL RECORD NO.:  [id number]  LOCATION:                                 FACILITY:  PHYSICIAN:  Revonda Standard. Roxan Hockey, M.D.DATE OF BIRTH:  Nov 09, 1934  DATE OF PROCEDURE:  04/23/2017 DATE OF DISCHARGE:                              OPERATIVE REPORT   PREOPERATIVE DIAGNOSIS:  Bilateral lung nodules and mediastinal adenopathy.  POSTOPERATIVE DIAGNOSIS:  Bilateral lung nodules and mediastinal adenopathy.  PROCEDURE:  Electromagnetic navigational bronchoscopy with brushings, needle aspirations, biopsies and bronchoalveolar lavage bilaterally and endobronchial ultrasound with mediastinal lymph node aspirations.  SURGEON:  Revonda Standard. Roxan Hockey, M.D.  ASSISTANT:  None.  ANESTHESIA:  General.  FINDINGS:  Mediastinal lymph node aspirations- nondiagnostic. Brushings from right lower lobe nodule #1 were positive for non-small cell carcinoma.  Brushings from right lower lobe nodule #2 showed atypical cells.  CLINICAL NOTE:  Angela Lawson is an 83 year old woman with a history of lung cancer.  She had a left lower lobectomy in 2009.  A CT in January showed progression of a ground-glass opacity in the left lower lobe as well as nodular opacities around a cavity in the right lower lobe.  A PET-CT showed these areas were hypermetabolic and showed some hypermetabolic adenopathy.  She recently had repeat CT that showed the nodules had progressed and she was advised to undergo navigational bronchoscopy and endobronchial ultrasound for diagnostic and staging purposes.  I did discuss with the patient whether she would be willing to undergo treatment for this, if they would be determined to be cancer. She indicated that she would do so.  OPERATIVE NOTE:  Angela Lawson was brought to the operating room on Apr 23, 2017.  She had induction of general anesthesia and was intubated. Flexible fiberoptic bronchoscopy was performed via the  endotracheal tube.  The trachea and carina were normal.  The right bronchial tree was normal to the level of the subsegmental bronchi.  On the left, the left mainstem bronchus was normal as was the left upper lobe to the level of the subsegmental bronchi.  The left lower lobe stump was well healed. The bronchoscope was removed.  The endobronchial ultrasound probe then was advanced.  Systematic inspection of the mediastinal lymph node stations was carried out. There were nodes in the 4R, 7, and 10R areas that were suitable for aspiration.  These aspirations were done with ultrasound guidance.  The needle was advanced into the lymph node and 10-12 passes with the needle were made in the node while slowly withdrawing the inner-cannula of the needle.  These specimens then were applied to slides for immediate examination.  Specimens were also obtained for cell block for permanent cytology.  These specimens were sent to Pathology.  While awaiting those results, the endobronchial ultrasound probe was removed and the bronchoscope was replaced.    The locatable guide for navigation was placed and registration was performed, there was good correlation of the video and virtual bronchoscopy.  The locatable guide then was advanced to the site selected for biopsy in the lower portion of the left upper lobe.  Multiple needle aspirations were performed followed by  needle brushings and finally multiple biopsies were taken.  These were sent to Pathology as well.  The scope then was redirected to the right lower lobe.  Two separate nodules had been selected.  The scope was advanced to the first nodule and had good alignment being about 5 mm from the nodule.  Brushings were performed with a triple brush followed by biopsies.  Next, the locatable guide was repositioned in the proximity of the second nodule, this was 9 mm from the nodule with good alignment.  Brushings and biopsies were repeated.  The  brushings were again sent to Pathology.  The node aspirations and left lower lobe brushings were nondiagnostic, but there was non-small cell carcinoma seen on the brushings from the right lower lobe nodule #1 and atypical cells with right lower lobe nodule #2.  The biopsies were sent for permanent pathology only.  Bronchoalveolar lavage was performed both the right lower lobe and the left upper lobe prior to removing the bronchoscope.  The patient then was extubated in the operating room and taken to the postanesthetic care unit in good condition.     Revonda Standard Roxan Hockey, M.D.     SCH/MEDQ  D:  04/23/2017  T:  04/23/2017  Job:  [id number]

## 2017-04-24 NOTE — Anesthesia Postprocedure Evaluation (Addendum)
Anesthesia Post Note  Patient: Angela Lawson  Procedure(s) Performed: Procedure(s) (LRB): VIDEO BRONCHOSCOPY WITH ENDOBRONCHIAL NAVIGATION (N/A) VIDEO BRONCHOSCOPY WITH ENDOBRONCHIAL ULTRASOUND (N/A)  Patient location during evaluation: PACU Anesthesia Type: General Level of consciousness: awake and alert Pain management: pain level controlled Vital Signs Assessment: post-procedure vital signs reviewed and stable Respiratory status: spontaneous breathing, nonlabored ventilation, respiratory function stable and patient connected to nasal cannula oxygen Cardiovascular status: stable Postop Assessment: no signs of nausea or vomiting Anesthetic complications: no       Last Vitals:  Vitals:   04/23/17 1230 04/23/17 1245  BP: (!) 133/57 (!) 133/50  Pulse: 72 72  Resp: 14 16  Temp:      Last Pain:  Vitals:   04/23/17 1245  TempSrc:   PainSc: 0-No pain                 Rane Dumm

## 2017-04-26 ENCOUNTER — Encounter (HOSPITAL_COMMUNITY): Payer: Self-pay | Admitting: Emergency Medicine

## 2017-04-26 ENCOUNTER — Emergency Department (HOSPITAL_COMMUNITY)
Admission: EM | Admit: 2017-04-26 | Discharge: 2017-04-26 | Disposition: A | Discharge status: Home / Self Care | Payer: Medicare Other | Attending: Emergency Medicine | Admitting: Emergency Medicine

## 2017-04-26 ENCOUNTER — Emergency Department (HOSPITAL_COMMUNITY): Payer: Medicare Other

## 2017-04-26 DIAGNOSIS — Z7982 Long term (current) use of aspirin: Secondary | ICD-10-CM | POA: Insufficient documentation

## 2017-04-26 DIAGNOSIS — Z79899 Other long term (current) drug therapy: Secondary | ICD-10-CM | POA: Diagnosis not present

## 2017-04-26 DIAGNOSIS — Z85118 Personal history of other malignant neoplasm of bronchus and lung: Secondary | ICD-10-CM | POA: Diagnosis not present

## 2017-04-26 DIAGNOSIS — E1122 Type 2 diabetes mellitus with diabetic chronic kidney disease: Secondary | ICD-10-CM | POA: Diagnosis not present

## 2017-04-26 DIAGNOSIS — J441 Chronic obstructive pulmonary disease with (acute) exacerbation: Secondary | ICD-10-CM

## 2017-04-26 DIAGNOSIS — I509 Heart failure, unspecified: Secondary | ICD-10-CM | POA: Diagnosis not present

## 2017-04-26 DIAGNOSIS — Z87891 Personal history of nicotine dependence: Secondary | ICD-10-CM | POA: Insufficient documentation

## 2017-04-26 DIAGNOSIS — I130 Hypertensive heart and chronic kidney disease with heart failure and stage 1 through stage 4 chronic kidney disease, or unspecified chronic kidney disease: Secondary | ICD-10-CM | POA: Insufficient documentation

## 2017-04-26 DIAGNOSIS — R0602 Shortness of breath: Secondary | ICD-10-CM | POA: Diagnosis present

## 2017-04-26 DIAGNOSIS — G20 Parkinson's disease: Secondary | ICD-10-CM | POA: Diagnosis not present

## 2017-04-26 DIAGNOSIS — N183 Chronic kidney disease, stage 3 (moderate): Secondary | ICD-10-CM | POA: Insufficient documentation

## 2017-04-26 DIAGNOSIS — R05 Cough: Secondary | ICD-10-CM | POA: Diagnosis not present

## 2017-04-26 LAB — URINALYSIS, ROUTINE W REFLEX MICROSCOPIC
Bacteria, UA: NONE SEEN
Bilirubin Urine: NEGATIVE
Glucose, UA: NEGATIVE mg/dL
Hgb urine dipstick: NEGATIVE
Ketones, ur: NEGATIVE mg/dL
Nitrite: NEGATIVE
Protein, ur: NEGATIVE mg/dL
Specific Gravity, Urine: 1.008 (ref 1.005–1.030)
pH: 6 (ref 5.0–8.0)

## 2017-04-26 LAB — CBC WITH DIFFERENTIAL/PLATELET
Basophils Absolute: 0 10*3/uL (ref 0.0–0.1)
Basophils Relative: 0 %
Eosinophils Absolute: 0.2 10*3/uL (ref 0.0–0.7)
Eosinophils Relative: 3 %
HCT: 34 % — ABNORMAL LOW (ref 36.0–46.0)
Hemoglobin: 10.8 g/dL — ABNORMAL LOW (ref 12.0–15.0)
Lymphocytes Relative: 22 %
Lymphs Abs: 1.9 10*3/uL (ref 0.7–4.0)
MCH: 30.3 pg (ref 26.0–34.0)
MCHC: 31.8 g/dL (ref 30.0–36.0)
MCV: 95.5 fL (ref 78.0–100.0)
Monocytes Absolute: 1.1 10*3/uL — ABNORMAL HIGH (ref 0.1–1.0)
Monocytes Relative: 13 %
Neutro Abs: 5.3 10*3/uL (ref 1.7–7.7)
Neutrophils Relative %: 62 %
Platelets: 193 10*3/uL (ref 150–400)
RBC: 3.56 MIL/uL — ABNORMAL LOW (ref 3.87–5.11)
RDW: 14.9 % (ref 11.5–15.5)
WBC: 8.5 10*3/uL (ref 4.0–10.5)

## 2017-04-26 LAB — COMPREHENSIVE METABOLIC PANEL
ALT: 15 U/L (ref 14–54)
AST: 19 U/L (ref 15–41)
Albumin: 3.7 g/dL (ref 3.5–5.0)
Alkaline Phosphatase: 51 U/L (ref 38–126)
Anion gap: 7 (ref 5–15)
BUN: 16 mg/dL (ref 6–20)
CO2: 30 mmol/L (ref 22–32)
Calcium: 10.1 mg/dL (ref 8.9–10.3)
Chloride: 105 mmol/L (ref 101–111)
Creatinine, Ser: 1.44 mg/dL — ABNORMAL HIGH (ref 0.44–1.00)
GFR calc Af Amer: 38 mL/min — ABNORMAL LOW (ref >60)
GFR calc non Af Amer: 33 mL/min — ABNORMAL LOW (ref >60)
Glucose, Bld: 111 mg/dL — ABNORMAL HIGH (ref 65–99)
Potassium: 4.6 mmol/L (ref 3.5–5.1)
Sodium: 142 mmol/L (ref 135–145)
Total Bilirubin: 0.5 mg/dL (ref 0.3–1.2)
Total Protein: 7 g/dL (ref 6.5–8.1)

## 2017-04-26 LAB — BRAIN NATRIURETIC PEPTIDE: B Natriuretic Peptide: 58 pg/mL (ref 0.0–100.0)

## 2017-04-26 LAB — I-STAT TROPONIN, ED: Troponin i, poc: 0.01 ng/mL (ref 0.00–0.08)

## 2017-04-26 MED ORDER — IPRATROPIUM-ALBUTEROL 0.5-2.5 (3) MG/3ML IN SOLN
3.0000 mL | Freq: Once | RESPIRATORY_TRACT | Status: AC
  Administered 2017-04-26: 3 mL via RESPIRATORY_TRACT

## 2017-04-26 MED ORDER — ALBUTEROL (5 MG/ML) CONTINUOUS INHALATION SOLN
10.0000 mg/h | INHALATION_SOLUTION | RESPIRATORY_TRACT | Status: DC
  Administered 2017-04-26: 10 mg/h via RESPIRATORY_TRACT
  Filled 2017-04-26: qty 20

## 2017-04-26 MED ORDER — PREDNISONE 20 MG PO TABS: ORAL_TABLET | ORAL | 0 refills | Status: DC

## 2017-04-26 MED ORDER — METHYLPREDNISOLONE SODIUM SUCC 125 MG IJ SOLR
125.0000 mg | Freq: Once | INTRAMUSCULAR | Status: AC
  Administered 2017-04-26: 125 mg via INTRAVENOUS
  Filled 2017-04-26: qty 2

## 2017-04-26 MED ORDER — ALPRAZOLAM 0.5 MG PO TABS
0.2500 mg | ORAL_TABLET | Freq: Once | ORAL | Status: AC
  Administered 2017-04-26: 0.25 mg via ORAL
  Filled 2017-04-26: qty 1

## 2017-04-26 MED ORDER — IPRATROPIUM-ALBUTEROL 0.5-2.5 (3) MG/3ML IN SOLN
RESPIRATORY_TRACT | Status: AC
  Administered 2017-04-26: 3 mL via RESPIRATORY_TRACT
  Filled 2017-04-26: qty 3

## 2017-04-26 NOTE — ED Provider Notes (Signed)
[facility] DEPT Provider Note   CSN: [id number] Arrival date & time: 04/26/17  1806     History   Chief Complaint Chief Complaint  Patient presents with  . Shortness of Breath    HPI Angela Lawson is a 83 y.o. female.  HPI patient had bronchoscopy performed 3 days ago. She's had shortness of breath since that time. Shortness breath is worse with exertion. She has cough but denies sputum production. Initially coughing up small amount of blood but none recently. Denies any new lower extremity swelling or pain. No fever or chills. Denies chest pain or back pain. She's been using her nebulizer at home. States she is chronically on 3 L of supplement oxygen.  Past Medical History:  Diagnosis Date  . Adenocarcinoma of lung ([place])    Left lung 2009, resected  . Anginal pain ([place])   . Arthritis   . Back pain   . CHF (congestive heart failure) ([place])   . COPD (chronic obstructive pulmonary disease) ([place])   . Diverticulitis   . Dyslipidemia   . Essential hypertension   . H/O ventral hernia   . Non-obstructive CAD    a. 04/2015 NSTEMI/Cath: LAD 10p, LCX 77m RCA 34m20d, EF 35-40 w/ apical ballooning.  . On home O2    3L N/C  . Osteoporosis   . Osteoporosis 11/04/2015   Managed by Dr. FaLegrand Rams . Parkinson's disease (HCNeeses  . Shortness of breath   . Takotsubo cardiomyopathy    a. 04/2015 Echo: EF 45-50%, mid-dist anterior/apical/inferoapical HK w/ hyperdynamic base. Gr 1 DD, mild AI, mild-mod MR, triv TR, PASP 4847m;  b. 04/2015 LV gram: Ef 35-40% w/ apical ballooning.  . Type II diabetes mellitus (HCCFalkner . Ventricular bigeminy    a. 04/2015 in setting of NSTEMI/Takotsubo.    Patient Active Problem List   Diagnosis Date Noted  . Acute renal injury (HCCMelvin4/28/2018  . Chest pain 04/04/2017  . Nausea and vomiting 04/04/2017  . COPD with acute exacerbation (HCCAllendale5/19/2017  . Acute on chronic respiratory failure with hypoxia (HCCMusc[place]5/19/2017  . SOB (shortness of breath)   .  Osteoporosis 11/04/2015  . CHF (congestive heart failure) (HCCBoiling Springs8/24/2016  . CHF exacerbation (HCCChoctaw8/12/2014  . Essential hypertension 04/19/2015  . Type II diabetes mellitus (HCCSum[place]5/11/2015  . Dyslipidemia 04/19/2015  . History of lung cancer 04/19/2015  . Takotsubo cardiomyopathy 04/19/2015  . Ventricular bigeminy 04/19/2015  . NSTEMI (non-ST elevated myocardial infarction) (HCCTimber Cove5/11/2015  . Stress-induced cardiomyopathy 04/18/2015  . Elevated troponin 04/16/2015  . COPD exacerbation (HCCHummelstown5/08/2015  . CKD (chronic kidney disease) stage 3, GFR 30-59 ml/min 04/16/2015  . Herpes genitalis in women 11/24/2014  . Toe fracture 09/28/2014  . Non-traumatic tear of right rotator cuff 11/30/2013  . Pain in joint, shoulder region 11/30/2013  . Muscle weakness (generalized) 11/30/2013  . Bursitis of left shoulder 02/09/2013  . Adenocarcinoma of lung (HCCTuttle9/13/2013    Past Surgical History:  Procedure Laterality Date  . ABDOMINAL HYSTERECTOMY    . CARDIAC CATHETERIZATION N/A 04/17/2015   Procedure: Left Heart Cath and Coronary Angiography;  Surgeon: ThoTroy SineD;  Location: [facility];  Service: Cardiovascular;  Laterality: N/A;  . CHOLECYSTECTOMY    . COLONOSCOPY N/A 09/18/2014   Procedure: COLONOSCOPY;  Surgeon: SanDanie BinderD;  [facility];  Service: Endoscopy;  Laterality: N/A;  8:30 AM - moved to 10:30 - CRosendo Gros notify pt  .  ECTOPIC PREGNANCY SURGERY    . INCISIONAL HERNIA REPAIR N/A 08/26/2013   Procedure: HERNIA REPAIR INCISIONAL WITH MESH;  Surgeon: Jamesetta So, MD;  Location: [facility];  Service: General;  Laterality: N/A;  . LUNG CANCER SURGERY    . VIDEO BRONCHOSCOPY WITH ENDOBRONCHIAL NAVIGATION N/A 04/23/2017   Procedure: VIDEO BRONCHOSCOPY WITH ENDOBRONCHIAL NAVIGATION;  Surgeon: Melrose Nakayama, MD;  Location: [facility];  Service: Thoracic;  Laterality: N/A;  . VIDEO BRONCHOSCOPY WITH ENDOBRONCHIAL ULTRASOUND N/A 04/23/2017    Procedure: VIDEO BRONCHOSCOPY WITH ENDOBRONCHIAL ULTRASOUND;  Surgeon: Melrose Nakayama, MD;  Location: [facility];  Service: Thoracic;  Laterality: N/A;    OB History    No data available       Home Medications    Prior to Admission medications   Medication Sig Start Date End Date Taking? Authorizing Provider  albuterol (PROAIR HFA) 108 (90 BASE) MCG/ACT inhaler Inhale 2 puffs into the lungs every 4 (four) hours as needed for wheezing or shortness of breath.   Yes [provider]  alendronate (FOSAMAX) 70 MG tablet Take 70 mg by mouth every Friday.    Yes [provider]  aspirin EC 81 MG tablet Take 81 mg by mouth daily.   Yes [provider]  atorvastatin (LIPITOR) 40 MG tablet Take 40 mg by mouth at bedtime.    Yes [provider]  buPROPion (WELLBUTRIN SR) 150 MG 12 hr tablet Take 150 mg by mouth 2 (two) times daily.   Yes [provider]  cholecalciferol (VITAMIN D) 1000 units tablet Take 1,000 Units by mouth daily.   Yes [provider]  furosemide (LASIX) 40 MG tablet Take 40 mg by mouth daily. Pt is able to take one additional tablet daily for weight increases of 3lbs in a day or 5lbs in a week.   Yes [provider]  gabapentin (NEURONTIN) 300 MG capsule Take 600 mg by mouth at bedtime.    Yes [provider]  Garlic 448 MG TABS Take 300 mg by mouth daily.   Yes [provider]  ipratropium-albuterol (DUONEB) 0.5-2.5 (3) MG/3ML SOLN Inhale 3 mLs into the lungs every 6 (six) hours as needed (for wheezing/shortness of breath).    Yes [provider]  LANTUS SOLOSTAR 100 UNIT/ML Solostar Pen Inject 15 Units into the skin at bedtime.    Yes [provider]  linagliptin (TRADJENTA) 5 MG TABS tablet Take 5 mg by mouth daily.   Yes [provider]  loratadine (CLARITIN) 10 MG tablet Take 10 mg by mouth daily.   Yes [provider]  losartan (COZAAR) 50 MG tablet Take 50 mg  by mouth daily.   Yes [provider]  meclizine (ANTIVERT) 25 MG tablet Take 25 mg by mouth daily.    Yes [provider]  metoprolol succinate (TOPROL-XL) 25 MG 24 hr tablet Take 1 tablet (25 mg total) by mouth daily. 06/26/15  Yes Jettie Booze, MD  nitroGLYCERIN (NITROSTAT) 0.4 MG SL tablet Place 0.4 mg under the tongue every 5 (five) minutes as needed for chest pain.   Yes [provider]  omega-3 acid ethyl esters (LOVAZA) 1 g capsule Take 1 g by mouth 2 (two) times daily.   Yes [provider]  omeprazole (PRILOSEC) 40 MG capsule Take 40 mg by mouth daily.   Yes [provider]  potassium chloride (K-DUR) 10 MEQ tablet Take 1 tablet (10 mEq total) by mouth daily. 05/21/16  Yes Dorene Ar,  Otilio Carpen, NP  pyridoxine (B-6) 100 MG tablet Take 100 mg by mouth daily.    Yes [provider]  rOPINIRole (REQUIP) 0.25 MG tablet Take 0.25 mg by mouth 3 (three) times daily.    Yes [provider]  sodium chloride (OCEAN) 0.65 % SOLN nasal spray Place 1 spray into both nostrils every 3 (three) hours as needed for congestion.    Yes [provider]  traMADol (ULTRAM) 50 MG tablet Take 50 mg by mouth every 12 (twelve) hours as needed for moderate pain.    Yes [provider]  predniSONE (DELTASONE) 20 MG tablet 3 tabs po day one, then 2 po daily x 4 days 04/26/17   Julianne Rice, MD    Family History Family History  Problem Relation Age of Onset  . Diabetes Mother   . Hypertension Mother   . Diabetes Father   . Asthma Unknown   . Cancer Unknown   . Heart attack Sister        X2  . Colon cancer Neg Hx     Social History Social History  Substance Use Topics  . Smoking status: Former Smoker    Years: 20.00    Types: Cigarettes    Quit date: 08/13/2006  . Smokeless tobacco: Never Used  . Alcohol use No     Allergies   Lisinopril   Review of Systems Review of Systems  Constitutional: Negative for chills,  diaphoresis and fever.  HENT: Negative for sore throat and trouble swallowing.   Respiratory: Positive for cough, shortness of breath and wheezing.   Cardiovascular: Negative for chest pain, palpitations and leg swelling.  Gastrointestinal: Negative for abdominal pain, constipation, diarrhea, nausea and vomiting.  Musculoskeletal: Negative for back pain, myalgias and neck pain.  Skin: Negative for rash and wound.  Neurological: Negative for dizziness, weakness, light-headedness, numbness and headaches.  All other systems reviewed and are negative.    Physical Exam Updated Vital Signs BP (!) 120/101   Pulse (!) 101   Temp 98.2 F (36.8 C) (Oral)   Resp 18   Ht '5\' 4"'$  (1.626 m)   Wt 79.4 kg (175 lb)   SpO2 95%   BMI 30.04 kg/m   Physical Exam  Constitutional: She is oriented to person, place, and time. She appears well-developed and well-nourished. No distress.  HENT:  Head: Normocephalic and atraumatic.  Mouth/Throat: Oropharynx is clear and moist. No oropharyngeal exudate.  Eyes: EOM are normal. Pupils are equal, round, and reactive to light.  Neck: Normal range of motion. Neck supple. No JVD present.  Cardiovascular: Normal rate and regular rhythm.  Exam reveals no gallop and no friction rub.   No murmur heard. Pulmonary/Chest: Effort normal. No respiratory distress. She has wheezes.  Expiratory wheezing in bilateral upper lung fields. Diminished breath sounds bilateral bases.  Abdominal: Soft. Bowel sounds are normal. There is no tenderness. There is no rebound and no guarding.  Musculoskeletal: Normal range of motion. She exhibits no edema or tenderness.  No lower extremity swelling or asymmetry. Distal pulses intact.  Neurological: She is alert and oriented to person, place, and time.  Skin: Skin is warm and dry. No rash noted. No erythema.  Psychiatric: She has a normal mood and affect. Her behavior is normal.  Nursing note and vitals reviewed.    ED Treatments /  Results  Labs (all labs ordered are listed, but only abnormal results are displayed) Labs Reviewed  CBC WITH DIFFERENTIAL/PLATELET - Abnormal; Notable for the  following:       Result Value   RBC 3.56 (*)    Hemoglobin 10.8 (*)    HCT 34.0 (*)    Monocytes Absolute 1.1 (*)    All other components within normal limits  COMPREHENSIVE METABOLIC PANEL - Abnormal; Notable for the following:    Glucose, Bld 111 (*)    Creatinine, Ser 1.44 (*)    GFR calc non Af Amer 33 (*)    GFR calc Af Amer 38 (*)    All other components within normal limits  URINALYSIS, ROUTINE W REFLEX MICROSCOPIC - Abnormal; Notable for the following:    Leukocytes, UA TRACE (*)    Squamous Epithelial / LPF 0-5 (*)    All other components within normal limits  BRAIN NATRIURETIC PEPTIDE  I-STAT TROPOININ, ED    EKG  EKG Interpretation  Date/Time:  Sunday Apr 26 2017 18:14:14 EDT Ventricular Rate:  82 PR Interval:    QRS Duration: 83 QT Interval:  382 QTC Calculation: 447 R Axis:   25 Text Interpretation:  Atrial flutter with predominant 4:1 AV block Anteroseptal infarct, age indeterminate No significant change since last tracing Confirmed by Orlie Dakin [phone]) on 04/27/2017 2:06:21 PM       Radiology Dg Chest 2 View  Result Date: 04/26/2017 CLINICAL DATA:  Shortness of breath since pulmonary procedure yesterday. History of lung cancer. EXAM: CHEST  2 VIEW COMPARISON:  04/21/2017 and chest CT 03/25/2017 FINDINGS: Lungs are adequately inflated with chronic elevation and lateralization of the left hemidiaphragm. Mild hazy opacification of the right base unchanged. Mild stable cardiomegaly. Calcified plaque present over the aortic arch. There are degenerative changes of the spine and shoulders. Opacification of the posterior lung bases on the lateral film without significant change, although possible cavitary component. IMPRESSION: Stable right base opacification as well as stable opacification over the  posterior lung bases on the lateral film. Possible cavitary component to the posterior basilar process. Chronic stable changes left base. Stable cardiomegaly. Aortic atherosclerosis. Electronically Signed   By: Marin Olp M.D.   On: 04/26/2017 20:29    Procedures Procedures (including critical care time)  Medications Ordered in ED Medications  ipratropium-albuterol (DUONEB) 0.5-2.5 (3) MG/3ML nebulizer solution 3 mL (3 mLs Nebulization Given 04/26/17 1823)  methylPREDNISolone sodium succinate (SOLU-MEDROL) 125 mg/2 mL injection 125 mg (125 mg Intravenous Given 04/26/17 1900)  ALPRAZolam (XANAX) tablet 0.25 mg (0.25 mg Oral Given 04/26/17 2257)     Initial Impression / Assessment and Plan / ED Course  I have reviewed the triage vital signs and the nursing notes.  Pertinent labs & imaging results that were available during my care of the patient were reviewed by me and considered in my medical decision making (see chart for details).     Patient is feeling better after breathing treatments and Solu-Medrol. X-ray with chronic changes. Patient appears to be at her baseline. We'll discharge home with family and return precautions given.  Final Clinical Impressions(s) / ED Diagnoses   Final diagnoses:  COPD exacerbation ([place])    New Prescriptions Discharge Medication List as of 04/26/2017 10:50 PM    START taking these medications   Details  predniSONE (DELTASONE) 20 MG tablet 3 tabs po day one, then 2 po daily x 4 days, Print         Julianne Rice, MD 04/27/17 1452

## 2017-04-26 NOTE — ED Notes (Signed)
Pt used bedside commode with O2 sats fluctuating between 85-92%

## 2017-04-26 NOTE — ED Triage Notes (Signed)
Pt reports having "a lung scope" done yesterday and has had increased shortness of breath since.

## 2017-04-26 NOTE — ED Notes (Signed)
Attempted to assist pt with bedpan. Pt's O2 sat dropped to 83%. Pt states she wants to get up and use the bathroom. After instructing pt to take some deep breaths pt's O2 came back up to 92%. RN Rueben Bash informed

## 2017-04-26 NOTE — ED Notes (Signed)
Pt reports a ? Bronchoscopy yesterday at [facility] - She reports shortness of breath with cough since then- She also reports tha she had the procedure to evaluate another spot on her lung

## 2017-04-26 NOTE — ED Notes (Signed)
Respiratory in to speak with pt and assess her home breathing meds as well as her respiratory status

## 2017-04-26 NOTE — ED Notes (Signed)
Patient transported to X-ray 

## 2017-04-28 DIAGNOSIS — G4709 Other insomnia: Secondary | ICD-10-CM | POA: Diagnosis not present

## 2017-04-28 DIAGNOSIS — J449 Chronic obstructive pulmonary disease, unspecified: Secondary | ICD-10-CM | POA: Diagnosis not present

## 2017-04-28 DIAGNOSIS — C3490 Malignant neoplasm of unspecified part of unspecified bronchus or lung: Secondary | ICD-10-CM | POA: Diagnosis not present

## 2017-04-28 DIAGNOSIS — G2581 Restless legs syndrome: Secondary | ICD-10-CM | POA: Diagnosis not present

## 2017-04-29 ENCOUNTER — Other Ambulatory Visit: Payer: Self-pay | Admitting: Radiology

## 2017-04-29 ENCOUNTER — Encounter (HOSPITAL_COMMUNITY): Payer: Self-pay

## 2017-04-29 ENCOUNTER — Encounter (HOSPITAL_COMMUNITY): Payer: Medicare Other | Attending: Adult Health | Admitting: Oncology

## 2017-04-29 ENCOUNTER — Encounter (HOSPITAL_COMMUNITY): Payer: Self-pay | Admitting: *Deleted

## 2017-04-29 VITALS — BP 133/47 | HR 85 | Temp 98.4°F | Resp 20 | Wt 172.3 lb

## 2017-04-29 DIAGNOSIS — C3431 Malignant neoplasm of lower lobe, right bronchus or lung: Secondary | ICD-10-CM | POA: Diagnosis not present

## 2017-04-29 DIAGNOSIS — C3412 Malignant neoplasm of upper lobe, left bronchus or lung: Secondary | ICD-10-CM

## 2017-04-29 DIAGNOSIS — C3490 Malignant neoplasm of unspecified part of unspecified bronchus or lung: Secondary | ICD-10-CM

## 2017-04-29 NOTE — Patient Instructions (Signed)
[facility] Discharge Instructions  RECOMMENDATIONS MADE BY THE CONSULTANT AND ANY TEST RESULTS WILL BE SENT TO YOUR REFERRING PHYSICIAN.  You were seen today by Dr. Louise Zhou    Thank you for choosing [facility] at [facility] to provide your oncology and hematology care.  To afford each patient quality time with our provider, please arrive at least 15 minutes before your scheduled appointment time.    If you have a lab appointment with the Cancer Center please come in thru the  Main Entrance and check in at the main information desk  You need to re-schedule your appointment should you arrive 10 or more minutes late.  We strive to give you quality time with our providers, and arriving late affects you and other patients whose appointments are after yours.  Also, if you no show three or more times for appointments you may be dismissed from the clinic at the providers discretion.     Again, thank you for choosing [facility].  Our hope is that these requests will decrease the amount of time that you wait before being seen by our physicians.       _____________________________________________________________  Should you have questions after your visit to [facility], please contact our office at [phone] between the hours of 8:30 a.m. and 4:30 p.m.  Voicemails left after 4:30 p.m. will not be returned until the following business day.  For prescription refill requests, have your pharmacy contact our office.       Resources For Cancer Patients and their Caregivers ? American Cancer Society: Can assist with transportation, wigs, general needs, runs Look Good Feel Better.        1-888-227-6333 ? Cancer Care: Provides financial assistance, online support groups, medication/co-pay assistance.  1-800-813-HOPE (4673) ? [facility] Assists [place] cancer patients and their  families through emotional , educational and financial support.  336-427-4357 ? [place] DSS Where to apply for food stamps, Medicaid and utility assistance. 336-342-1394 ? [facility]: Transportation to medical appointments. 336-347-2287 ? Social Security Administration: May apply for disability if have a Stage IV cancer. 336-342-7796 1-800-772-1213 ? [place] Aging, Disability and Transit Services: Assists with nutrition, care and transit needs. 336-349-2343  Cancer Center Support Programs: @10RELATIVEDAYS@ > Cancer Support Group  2nd Tuesday of the month 1pm-2pm, Journey Room  > Creative Journey  3rd Tuesday of the month 1130am-1pm, Journey Room  > Look Good Feel Better  1st Wednesday of the month 10am-12 noon, Journey Room (Call American Cancer Society to register 1-800-395-5775)    

## 2017-04-29 NOTE — Progress Notes (Signed)
[place], [place] [address]   CLINIC:  Medical Oncology/Hematology  PCP:  Rosita Fire, MD [place] [address] [phone]   REASON FOR VISIT:  New/recurrent adenocarcinoma of the left and right lung; h/o Stage IA NSCLC s/p resection in 2009.   CURRENT THERAPY: Initial work-up    BRIEF ONCOLOGIC HISTORY:  Oncology History   stage I A., non-small cell lung cancer, consistent with a poorly differentiated adenocarcinoma, status post resection 07/07/2008 in [place].       Adenocarcinoma of lung ([place])   07/07/2008 Surgery    Performed in [place], [place].  Consistent with poorly differentiated adenocarcinoma.      02/27/2014 Imaging    CT of chest demonstrating a RLL solid nodular component measuring 9 mm at the site of previous ground-glass opacity. Slight increase in size of an anterior mediastinal node measuring 1.1 cm with stable 1 cm pretracheal lymph node. Consider PET      03/20/2014 Imaging    PET scan- No hypermetabolic pulmonary lesions to suggest recurrent or metastatic pulmonary disease.  Weakly positive left internal mammary lymph node and left axillary lymph node (? inflammatory process involving the left breast).      11/07/2015 Imaging    CT chest- 1. No evidence of metastatic involvement of the lungs. The previously noted small noncalcified lung nodules are stable. 2. Vague focal opacity in the right lower lobe may represent scarring and is stable. Continued followup however is recommended in 1 year.      01/08/2017 Imaging    CT chest- 1. New nodules along a thick walled bullous lesion in the right lower lobe, suspicious for malignancy. 2. New fairly large region of confluent ground-glass opacity posteriorly in the remaining left lung, low grade adenocarcinoma is not excluded and there is mild new nodularity along the upper margin of this process. 3. Stable biapical pleuroparenchymal  scarring with several stable nodules in the left lung. 4. Borderline adenopathy in the mediastinum, essentially stable from prior. 5. Coronary, aortic arch, and branch vessel atherosclerotic vascular disease. 6. Small hiatal hernia. 7. Hypodense left thyroid lesion. If not previously worked up, thyroid ultrasound could be employed.      01/21/2017 PET scan    1. New hypermetabolic adenopathy in the mediastinum and hila, with progressive hypermetabolic ground-glass density posteriorly in the left lower lobe with faint nodular components, and some low-grade hypermetabolic activity in ground-glass opacity dependently in the right upper lobe. Although the appearance is concerning for malignancy potentially with a lymphangitic component in the left lower lobe, the unusual configuration in the appearance of the ground-glass opacities could conceivably represent an atypical granulomatous infectious process as an alternative. This is not classic obvious recurrence with well-defined mass or nodules. 2. There is some ill-defined clustered nodularity in the right lower lobe only faintly metabolic, maximum SUV 3.0. 3. Overall I believe that this process require surveillance to differentiate an inflammatory/infectious condition from recurrent malignancy. The adenopathy is certainly concerning. 4. No findings of malignancy in the neck, abdomen/pelvis, or skeleton. 5. Sigmoid colon diverticulosis. 6. Emphysema. 7. Chronic ethmoid sinusitis.      03/25/2017 Imaging    CT chest- Stable 5.9 cm left lower lobe ground-glass opacity, hypermetabolic on prior PET, worrisome for primary bronchogenic neoplasm such as low-grade adenocarcinoma.  Stable 2.2 cm cavitary lesion in the central right lower lobe with surrounding hypermetabolic nodularity measuring up to 1.8 cm, worrisome for primary bronchogenic neoplasm such as  squamous cell carcinoma.  Hypermetabolic mediastinal lymph nodes measuring up  to 11 mm short axis, as above. Bilateral hilar regions are hypermetabolic on PET but poorly evaluated on unenhanced CT.  Emphysema and aortic atherosclerosis.      04/23/2017 Procedure    Bronchoscopy and EBUS with mediastinal lymph node aspirations by Dr. Roxan Hockey.      04/26/2017 Pathology Results    1. Lung, biopsy, Left upper lobe - ADENOCARCINOMA. - SEE COMMENT. 2. Lung, biopsy, Right lower lobe - ADENOCARCINOMA. - SEE COMMENT. 3. Lung, biopsy, Right lower lobe target 2 - ATYPICAL CELLS. - SEE COMMENT.      04/26/2017 Relapse/Recurrence           HISTORY OF PRESENT ILLNESS:  (From Kirby Crigler, PA-C's last note on 12/05/16)   On 01/07/17, she had CT chest for surveillance of her lung cancer; new nodules were noted and concerning for malignancy.  She was recently admitted on 01/10/17-01/13/17 for COPD exacerbation; she was discharged on oral steroids at that time.  On 01/21/17, she underwent PET scan revealing new hypermetabolic adenopathy in the mediastinum and hilum, with progressive hypermetabolic groundglass density in the left lower lobe; there is mention of possible concern of malignancy with a lymphangitic component in the left lower lobe, but this may also represent an atypical granulomatous infectious process as well; there were no findings of malignancy in the neck, abdomen/pelvis, or skeleton. It was recommended by cardiothoracic surgery that we repeat imaging in 2-3 months after hospitalization to re-assess lesions noted to CT and PET scans.     INTERVAL HISTORY: Patient presents for follow-up today with her 3 daughters. Since her last visit patient underwent biopsy of her lung masses on 04/26/17 which demonstrated adenocarcinoma and the right lower lobe mass as well as adenocarcinoma in the left upper lobe mass. Patient states that she has no energy and some feels chronically fatigued and spends most of her time either sitting down or in bed. She states that she can  still walk around her home, however she asked for a home health nurse. Her shortness of breath is at baseline at this time. She did recently go to the ER on 04/26/17 for shortness of breath. She was given breathing treatments as well as Solu-Medrol which improved her shortness of breath and she was discharged from the ED on prednisone taper. Patient states her appetite is a lot better now that she is on prednisone, however prior to that her appetite was not so good. She denies any chest pain, focal weakness, abdominal pain, N/V/D.      REVIEW OF SYSTEMS:  Review of Systems  Constitutional: Positive for fatigue. Negative for appetite change, chills and fever.  HENT:   Negative for hearing loss, lump/mass, mouth sores, sore throat, tinnitus and trouble swallowing (improved).   Eyes: Negative.  Negative for eye problems and icterus.  Respiratory: Positive for shortness of breath. Negative for chest tightness, cough, hemoptysis and wheezing.   Cardiovascular: Negative.  Negative for chest pain, leg swelling and palpitations.  Gastrointestinal: Negative.  Negative for abdominal distention, abdominal pain, blood in stool, constipation, diarrhea, nausea and vomiting.  Endocrine: Negative.  Negative for hot flashes.  Genitourinary: Negative.  Negative for difficulty urinating, dysuria, frequency, hematuria and vaginal bleeding.   Musculoskeletal: Negative.  Negative for arthralgias and neck pain.  Skin: Negative.  Negative for itching and rash.  Neurological: Negative for dizziness, headaches and speech difficulty.  Hematological: Negative for adenopathy. Does not bruise/bleed easily.  Psychiatric/Behavioral: Negative.  Negative for confusion. The patient is not nervous/anxious.      PAST MEDICAL/SURGICAL HISTORY:  Past Medical History:  Diagnosis Date  . Adenocarcinoma of lung ([facility])    Left lung 2009, resected  . Anginal pain ([facility])   . Arthritis   . Back pain   . CHF (congestive heart  failure) ([facility])   . COPD (chronic obstructive pulmonary disease) ([facility])   . Diverticulitis   . Dyslipidemia   . Essential hypertension   . H/O ventral hernia   . Non-obstructive CAD    a. 04/2015 NSTEMI/Cath: LAD 10p, LCX 95m, RCA 53m, 20d, EF 35-40 w/ apical ballooning.  . On home O2    3L N/C  . Osteoporosis   . Osteoporosis 11/04/2015   Managed by Dr. Legrand Rams   . Parkinson's disease ([facility])   . Shortness of breath   . Takotsubo cardiomyopathy    a. 04/2015 Echo: EF 45-50%, mid-dist anterior/apical/inferoapical HK w/ hyperdynamic base. Gr 1 DD, mild AI, mild-mod MR, triv TR, PASP 15mmHg;  b. 04/2015 LV gram: Ef 35-40% w/ apical ballooning.  . Type II diabetes mellitus ([facility])   . Ventricular bigeminy    a. 04/2015 in setting of NSTEMI/Takotsubo.   Past Surgical History:  Procedure Laterality Date  . ABDOMINAL HYSTERECTOMY    . CARDIAC CATHETERIZATION N/A 04/17/2015   Procedure: Left Heart Cath and Coronary Angiography;  Surgeon: Troy Sine, MD;  Location: [facility];  Service: Cardiovascular;  Laterality: N/A;  . CHOLECYSTECTOMY    . COLONOSCOPY N/A 09/18/2014   Procedure: COLONOSCOPY;  Surgeon: Danie Binder, MD;  Location: [facility];  Service: Endoscopy;  Laterality: N/A;  8:30 AM - moved to 10:30 Rosendo Gros to notify pt  . ECTOPIC PREGNANCY SURGERY    . INCISIONAL HERNIA REPAIR N/A 08/26/2013   Procedure: HERNIA REPAIR INCISIONAL WITH MESH;  Surgeon: Jamesetta So, MD;  Location: [facility];  Service: General;  Laterality: N/A;  . LUNG CANCER SURGERY    . VIDEO BRONCHOSCOPY WITH ENDOBRONCHIAL NAVIGATION N/A 04/23/2017   Procedure: VIDEO BRONCHOSCOPY WITH ENDOBRONCHIAL NAVIGATION;  Surgeon: Melrose Nakayama, MD;  Location: [facility];  Service: Thoracic;  Laterality: N/A;  . VIDEO BRONCHOSCOPY WITH ENDOBRONCHIAL ULTRASOUND N/A 04/23/2017   Procedure: VIDEO BRONCHOSCOPY WITH ENDOBRONCHIAL ULTRASOUND;  Surgeon: Melrose Nakayama, MD;  Location: [facility];  Service: Thoracic;   Laterality: N/A;     SOCIAL HISTORY:  Social History   Social History  . Marital status: Widowed    Spouse name: N/A  . Number of children: N/A  . Years of education: N/A   Occupational History  . Not on file.   Social History Main Topics  . Smoking status: Former Smoker    Years: 20.00    Types: Cigarettes    Quit date: 08/13/2006  . Smokeless tobacco: Never Used  . Alcohol use No  . Drug use: No  . Sexual activity: Not Currently    Birth control/ protection: Surgical   Other Topics Concern  . Not on file   Social History Narrative  . No narrative on file    FAMILY HISTORY:  Family History  Problem Relation Age of Onset  . Diabetes Mother   . Hypertension Mother   . Diabetes Father   . Asthma Unknown   . Cancer Unknown   . Heart attack Sister        X2  . Colon cancer Neg Hx     CURRENT MEDICATIONS:  Outpatient Encounter  Prescriptions as of 04/29/2017  Medication Sig  . albuterol (PROAIR HFA) 108 (90 BASE) MCG/ACT inhaler Inhale 2 puffs into the lungs every 4 (four) hours as needed for wheezing or shortness of breath.  Marland Kitchen alendronate (FOSAMAX) 70 MG tablet Take 70 mg by mouth every Friday.   Marland Kitchen aspirin EC 81 MG tablet Take 81 mg by mouth daily.  Marland Kitchen atorvastatin (LIPITOR) 40 MG tablet Take 40 mg by mouth at bedtime.   Marland Kitchen buPROPion (WELLBUTRIN SR) 150 MG 12 hr tablet Take 150 mg by mouth 2 (two) times daily.  . cholecalciferol (VITAMIN D) 1000 units tablet Take 1,000 Units by mouth daily.  . furosemide (LASIX) 40 MG tablet Take 40 mg by mouth daily. Pt is able to take one additional tablet daily for weight increases of 3lbs in a day or 5lbs in a week.  . gabapentin (NEURONTIN) 300 MG capsule Take 600 mg by mouth at bedtime.   . Garlic 948 MG TABS Take 300 mg by mouth daily.  Marland Kitchen ipratropium-albuterol (DUONEB) 0.5-2.5 (3) MG/3ML SOLN Inhale 3 mLs into the lungs every 6 (six) hours as needed (for wheezing/shortness of breath).   Marland Kitchen LANTUS SOLOSTAR 100 UNIT/ML Solostar  Pen Inject 15 Units into the skin at bedtime.   Marland Kitchen linagliptin (TRADJENTA) 5 MG TABS tablet Take 5 mg by mouth daily.  Marland Kitchen loratadine (CLARITIN) 10 MG tablet Take 10 mg by mouth daily.  Marland Kitchen losartan (COZAAR) 50 MG tablet Take 50 mg by mouth daily.  . meclizine (ANTIVERT) 25 MG tablet Take 25 mg by mouth daily.   . metoprolol succinate (TOPROL-XL) 25 MG 24 hr tablet Take 1 tablet (25 mg total) by mouth daily.  . nitroGLYCERIN (NITROSTAT) 0.4 MG SL tablet Place 0.4 mg under the tongue every 5 (five) minutes as needed for chest pain.  Marland Kitchen omega-3 acid ethyl esters (LOVAZA) 1 g capsule Take 1 g by mouth 2 (two) times daily.  Marland Kitchen omeprazole (PRILOSEC) 40 MG capsule Take 40 mg by mouth daily.  . potassium chloride (K-DUR) 10 MEQ tablet Take 1 tablet (10 mEq total) by mouth daily.  . predniSONE (DELTASONE) 20 MG tablet 3 tabs po day one, then 2 po daily x 4 days  . pyridoxine (B-6) 100 MG tablet Take 100 mg by mouth daily.   . ropinirole (REQUIP) 5 MG tablet Take 5 mg by mouth 3 (three) times daily.  . sodium chloride (OCEAN) 0.65 % SOLN nasal spray Place 1 spray into both nostrils every 3 (three) hours as needed for congestion.   . traMADol (ULTRAM) 50 MG tablet Take 50 mg by mouth every 12 (twelve) hours as needed for moderate pain.   Marland Kitchen zolpidem (AMBIEN) 5 MG tablet Take 5 mg by mouth at bedtime as needed for sleep.  . [DISCONTINUED] rOPINIRole (REQUIP) 0.25 MG tablet Take 0.25 mg by mouth 3 (three) times daily.    No facility-administered encounter medications on file as of 04/29/2017.     ALLERGIES:  Allergies  Allergen Reactions  . Lisinopril Cough     PHYSICAL EXAM:  ECOG Performance status: 2-3  Vitals:   04/29/17 1007  BP: (!) 133/47  Pulse: 85  Resp: 20  Temp: 98.4 F (36.9 C)     Physical Exam  Constitutional: She is oriented to person, place, and time and well-developed, well-nourished, and in no distress. No distress.  Seen seated in wheelchair, oxygen dependent with portable  oxygen  HENT:  Head: Normocephalic and atraumatic.  Mouth/Throat: Oropharynx is clear and  moist. No oropharyngeal exudate.  Eyes: Conjunctivae are normal. Pupils are equal, round, and reactive to light. No scleral icterus.  Neck: Normal range of motion. Neck supple. No JVD present.  Cardiovascular: Normal rate, regular rhythm and normal heart sounds.  Exam reveals no gallop and no friction rub.   No murmur heard. Pulmonary/Chest: Effort normal. No respiratory distress. She has no wheezes. She has no rhonchi. She has no rales.  O2 via nasal cannula in place  Abdominal: Soft. Bowel sounds are normal. She exhibits no distension. There is no tenderness. There is no rebound and no guarding.  Musculoskeletal: Normal range of motion. She exhibits no edema or tenderness.  Lymphadenopathy:    She has no cervical adenopathy.  Neurological: She is alert and oriented to person, place, and time. No cranial nerve deficit.  Skin: Skin is warm and dry. No rash noted. No erythema. No pallor.  (L) great toenail absent; healing wound without evidence of cellulitis.   Psychiatric: Mood, memory, affect and judgment normal.  Nursing note and vitals reviewed.    LABORATORY DATA:  I have reviewed the labs as listed.  CBC    Component Value Date/Time   WBC 8.5 04/26/2017 1859   RBC 3.56 (L) 04/26/2017 1859   HGB 10.8 (L) 04/26/2017 1859   HCT 34.0 (L) 04/26/2017 1859   PLT 193 04/26/2017 1859   MCV 95.5 04/26/2017 1859   MCH 30.3 04/26/2017 1859   MCHC 31.8 04/26/2017 1859   RDW 14.9 04/26/2017 1859   LYMPHSABS 1.9 04/26/2017 1859   MONOABS 1.1 (H) 04/26/2017 1859   EOSABS 0.2 04/26/2017 1859   BASOSABS 0.0 04/26/2017 1859   CMP Latest Ref Rng & Units 04/26/2017 04/21/2017 04/06/2017  Glucose 65 - 99 mg/dL 111(H) 111(H) 97  BUN 6 - 20 mg/dL 16 16 23(H)  Creatinine 0.44 - 1.00 mg/dL 1.44(H) 1.28(H) 1.69(H)  Sodium 135 - 145 mmol/L 142 139 137  Potassium 3.5 - 5.1 mmol/L 4.6 4.5 4.8  Chloride 101 -  111 mmol/L 105 103 102  CO2 22 - 32 mmol/L 30 29 31   Calcium 8.9 - 10.3 mg/dL 10.1 9.3 9.2  Total Protein 6.5 - 8.1 g/dL 7.0 6.9 -  Total Bilirubin 0.3 - 1.2 mg/dL 0.5 0.3 -  Alkaline Phos 38 - 126 U/L 51 54 -  AST 15 - 41 U/L 19 20 -  ALT 14 - 54 U/L 15 16 -    PENDING LABS:    DIAGNOSTIC IMAGING:  PET scan: 01/21/17     CT chest: 03/25/17      PATHOLOGY:     ASSESSMENT & PLAN:  Cancer Staging Adenocarcinoma of lung ([place]) Staging form: Lung, AJCC 7th Edition - Clinical: Stage IA - Signed by Baird Cancer, PA-C on 02/27/2014 - Pathologic stage from 04/26/2017: Stage IV (T2b(2), N3, M1a) - Signed by Baird Cancer, PA-C on 04/26/2017   PLAN: -I have reviewed patient's biopsy results in detail with her. She currently has new/recurrent adenocarcinoma of the lung. I discussed with the patient that given that she has bilateral lung nodules, this places her in a stage IV. Therefore REMAINS I will be given will be palliative in nature and she will not be cured of her lung cancer. Patient verbalized understanding. -I have discussed with her that given her progressively declining performance status, she will not be a candidate for doublet chemotherapy as a treatment option for her lung cancer. However she may qualify for immunotherapy. We have sent out for [facility]  One testing on her biopsy specimen. She may be able to tolerate either opdivo or [place].  -In the mean time we'll work on chemotherapy port placement. I have made a referral to IR. -Return to clinic in 2 weeks to review her [facility] results and to discuss her treatment options.   All questions were answered to patient's stated satisfaction. Encouraged patient to call with any new concerns or questions before her next visit to the cancer center and we can certain see her sooner, if needed.     Twana First, MD

## 2017-04-30 ENCOUNTER — Ambulatory Visit (HOSPITAL_COMMUNITY)
Admission: RE | Admit: 2017-04-30 | Discharge: 2017-04-30 | Disposition: A | Discharge status: Home / Self Care | Payer: Medicare Other | Source: Ambulatory Visit | Attending: Oncology | Admitting: Oncology

## 2017-04-30 ENCOUNTER — Encounter (HOSPITAL_COMMUNITY): Payer: Self-pay

## 2017-04-30 ENCOUNTER — Other Ambulatory Visit (HOSPITAL_COMMUNITY): Payer: Self-pay | Admitting: Oncology

## 2017-04-30 DIAGNOSIS — Z7982 Long term (current) use of aspirin: Secondary | ICD-10-CM | POA: Diagnosis not present

## 2017-04-30 DIAGNOSIS — I2510 Atherosclerotic heart disease of native coronary artery without angina pectoris: Secondary | ICD-10-CM | POA: Insufficient documentation

## 2017-04-30 DIAGNOSIS — I252 Old myocardial infarction: Secondary | ICD-10-CM | POA: Insufficient documentation

## 2017-04-30 DIAGNOSIS — C3490 Malignant neoplasm of unspecified part of unspecified bronchus or lung: Secondary | ICD-10-CM

## 2017-04-30 DIAGNOSIS — Z7952 Long term (current) use of systemic steroids: Secondary | ICD-10-CM | POA: Insufficient documentation

## 2017-04-30 DIAGNOSIS — I110 Hypertensive heart disease with heart failure: Secondary | ICD-10-CM | POA: Insufficient documentation

## 2017-04-30 DIAGNOSIS — M1990 Unspecified osteoarthritis, unspecified site: Secondary | ICD-10-CM | POA: Diagnosis not present

## 2017-04-30 DIAGNOSIS — Z87891 Personal history of nicotine dependence: Secondary | ICD-10-CM | POA: Insufficient documentation

## 2017-04-30 DIAGNOSIS — M810 Age-related osteoporosis without current pathological fracture: Secondary | ICD-10-CM | POA: Diagnosis not present

## 2017-04-30 DIAGNOSIS — Z9981 Dependence on supplemental oxygen: Secondary | ICD-10-CM | POA: Insufficient documentation

## 2017-04-30 DIAGNOSIS — J449 Chronic obstructive pulmonary disease, unspecified: Secondary | ICD-10-CM | POA: Diagnosis not present

## 2017-04-30 DIAGNOSIS — G20 Parkinson's disease: Secondary | ICD-10-CM | POA: Diagnosis not present

## 2017-04-30 DIAGNOSIS — Z794 Long term (current) use of insulin: Secondary | ICD-10-CM | POA: Insufficient documentation

## 2017-04-30 DIAGNOSIS — E785 Hyperlipidemia, unspecified: Secondary | ICD-10-CM | POA: Diagnosis not present

## 2017-04-30 DIAGNOSIS — I509 Heart failure, unspecified: Secondary | ICD-10-CM | POA: Diagnosis not present

## 2017-04-30 DIAGNOSIS — Z452 Encounter for adjustment and management of vascular access device: Secondary | ICD-10-CM | POA: Diagnosis not present

## 2017-04-30 DIAGNOSIS — C3492 Malignant neoplasm of unspecified part of left bronchus or lung: Secondary | ICD-10-CM | POA: Diagnosis not present

## 2017-04-30 DIAGNOSIS — E119 Type 2 diabetes mellitus without complications: Secondary | ICD-10-CM | POA: Insufficient documentation

## 2017-04-30 HISTORY — PX: IR FLUORO GUIDE PORT INSERTION RIGHT: IMG5741

## 2017-04-30 HISTORY — PX: IR US GUIDE VASC ACCESS RIGHT: IMG2390

## 2017-04-30 LAB — PROTIME-INR
INR: 0.99
Prothrombin Time: 13.1 seconds (ref 11.4–15.2)

## 2017-04-30 LAB — APTT: aPTT: 25 seconds (ref 24–36)

## 2017-04-30 LAB — GLUCOSE, CAPILLARY: Glucose-Capillary: 97 mg/dL (ref 65–99)

## 2017-04-30 MED ORDER — HEPARIN SOD (PORK) LOCK FLUSH 100 UNIT/ML IV SOLN
INTRAVENOUS | Status: AC
Start: 2017-04-30 — End: 2017-04-30
  Filled 2017-04-30: qty 5

## 2017-04-30 MED ORDER — SODIUM CHLORIDE 0.9 % IV SOLN
INTRAVENOUS | Status: DC
  Administered 2017-04-30: 07:00:00 via INTRAVENOUS

## 2017-04-30 MED ORDER — MIDAZOLAM HCL 2 MG/2ML IJ SOLN
INTRAMUSCULAR | Status: AC | PRN
  Administered 2017-04-30: 1 mg via INTRAVENOUS

## 2017-04-30 MED ORDER — FENTANYL CITRATE (PF) 100 MCG/2ML IJ SOLN
INTRAMUSCULAR | Status: AC | PRN
  Administered 2017-04-30: 50 ug via INTRAVENOUS

## 2017-04-30 MED ORDER — LIDOCAINE HCL 1 % IJ SOLN
INTRAMUSCULAR | Status: AC | PRN
  Administered 2017-04-30: 15 mL

## 2017-04-30 MED ORDER — HEPARIN SOD (PORK) LOCK FLUSH 100 UNIT/ML IV SOLN
INTRAVENOUS | Status: AC | PRN
  Administered 2017-04-30: 500 [IU] via INTRAVENOUS

## 2017-04-30 MED ORDER — LIDOCAINE HCL 1 % IJ SOLN
INTRAMUSCULAR | Status: AC
  Filled 2017-04-30: qty 20

## 2017-04-30 MED ORDER — CEFAZOLIN SODIUM-DEXTROSE 2-4 GM/100ML-% IV SOLN
INTRAVENOUS | Status: AC
  Administered 2017-04-30: 2 g via INTRAVENOUS
  Filled 2017-04-30: qty 100

## 2017-04-30 MED ORDER — FENTANYL CITRATE (PF) 100 MCG/2ML IJ SOLN
INTRAMUSCULAR | Status: AC
Start: 2017-04-30 — End: 2017-04-30
  Filled 2017-04-30: qty 4

## 2017-04-30 MED ORDER — CEFAZOLIN SODIUM-DEXTROSE 2-4 GM/100ML-% IV SOLN
2.0000 g | Freq: Once | INTRAVENOUS | Status: AC
  Administered 2017-04-30: 2 g via INTRAVENOUS

## 2017-04-30 MED ORDER — MIDAZOLAM HCL 2 MG/2ML IJ SOLN
INTRAMUSCULAR | Status: AC
  Filled 2017-04-30: qty 6

## 2017-04-30 NOTE — Procedures (Signed)
RIJV PAC SVC RA EBL 0 Comp 0 

## 2017-04-30 NOTE — Discharge Instructions (Signed)
Implanted Port Home Guide °An implanted port is a type of central line that is placed under the skin. Central lines are used to provide IV access when treatment or nutrition needs to be given through a person’s veins. Implanted ports are used for long-term IV access. An implanted port may be placed because: °· You need IV medicine that would be irritating to the small veins in your hands or arms. °· You need long-term IV medicines, such as antibiotics. °· You need IV nutrition for a long period. °· You need frequent blood draws for lab tests. °· You need dialysis. °Implanted ports are usually placed in the chest area, but they can also be placed in the upper arm, the abdomen, or the leg. An implanted port has two main parts: °· Reservoir. The reservoir is round and will appear as a small, raised area under your skin. The reservoir is the part where a needle is inserted to give medicines or draw blood. °· Catheter. The catheter is a thin, flexible tube that extends from the reservoir. The catheter is placed into a large vein. Medicine that is inserted into the reservoir goes into the catheter and then into the vein. °How will I care for my incision site? °Do not get the incision site wet. Bathe or shower as directed by your health care provider. °How is my port accessed? °Special steps must be taken to access the port: °· Before the port is accessed, a numbing cream can be placed on the skin. This helps numb the skin over the port site. °· Your health care provider uses a sterile technique to access the port. °¨ Your health care provider must put on a mask and sterile gloves. °¨ The skin over your port is cleaned carefully with an antiseptic and allowed to dry. °¨ The port is gently pinched between sterile gloves, and a needle is inserted into the port. °· Only "non-coring" port needles should be used to access the port. Once the port is accessed, a blood return should be checked. This helps ensure that the port is  in the vein and is not clogged. °· If your port needs to remain accessed for a constant infusion, a clear (transparent) bandage will be placed over the needle site. The bandage and needle will need to be changed every week, or as directed by your health care provider. °· Keep the bandage covering the needle clean and dry. Do not get it wet. Follow your health care provider’s instructions on how to take a shower or bath while the port is accessed. °· If your port does not need to stay accessed, no bandage is needed over the port. °What is flushing? °Flushing helps keep the port from getting clogged. Follow your health care provider’s instructions on how and when to flush the port. Ports are usually flushed with saline solution or a medicine called heparin. The need for flushing will depend on how the port is used. °· If the port is used for intermittent medicines or blood draws, the port will need to be flushed: °¨ After medicines have been given. °¨ After blood has been drawn. °¨ As part of routine maintenance. °· If a constant infusion is running, the port may not need to be flushed. °How long will my port stay implanted? °The port can stay in for as long as your health care provider thinks it is needed. When it is time for the port to come out, surgery will be done to remove it.   The procedure is similar to the one performed when the port was put in. When should I seek immediate medical care? When you have an implanted port, you should seek immediate medical care if:  You notice a bad smell coming from the incision site.  You have swelling, redness, or drainage at the incision site.  You have more swelling or pain at the port site or the surrounding area.  You have a fever that is not controlled with medicine. This information is not intended to replace advice given to you by your health care provider. Make sure you discuss any questions you have with your health care provider. Document Released:  11/24/2005 Document Revised: 05/01/2016 Document Reviewed: 08/01/2013 Elsevier Interactive Patient Education  2017 [place]. Moderate Conscious Sedation, Adult, Care After These instructions provide you with information about caring for yourself after your procedure. Your health care provider may also give you more specific instructions. Your treatment has been planned according to current medical practices, but problems sometimes occur. Call your health care provider if you have any problems or questions after your procedure. What can I expect after the procedure? After your procedure, it is common:  To feel sleepy for several hours.  To feel clumsy and have poor balance for several hours.  To have poor judgment for several hours.  To vomit if you eat too soon. Follow these instructions at home: For at least 24 hours after the procedure:    Do not:  Participate in activities where you could fall or become injured.  Drive.  Use heavy machinery.  Drink alcohol.  Take sleeping pills or medicines that cause drowsiness.  Make important decisions or sign legal documents.  Take care of children on your own.  Rest. Eating and drinking   Follow the diet recommended by your health care provider.  If you vomit:  Drink water, juice, or soup when you can drink without vomiting.  Make sure you have little or no nausea before eating solid foods. General instructions   Have a responsible adult stay with you until you are awake and alert.  Take over-the-counter and prescription medicines only as told by your health care provider.  If you smoke, do not smoke without supervision.  Keep all follow-up visits as told by your health care provider. This is important. Contact a health care provider if:  You keep feeling nauseous or you keep vomiting.  You feel light-headed.  You develop a rash.  You have a fever. Get help right away if:  You have trouble breathing. This  information is not intended to replace advice given to you by your health care provider. Make sure you discuss any questions you have with your health care provider. Document Released: 09/14/2013 Document Revised: 04/28/2016 Document Reviewed: 03/15/2016 Elsevier Interactive Patient Education  2017 [employer].

## 2017-04-30 NOTE — Consult Note (Signed)
Chief Complaint: Patient was seen in consultation today for  Port-A-Cath placement  Referring Physician(s): Zhou,Louise  Supervising Physician: Marybelle Killings  Patient Status: [facility] - Out-pt  History of Present Illness: Angela Lawson is a 83 y.o. 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 She now has recurrent disease and presents today for Port-A-Cath placement prior to additional treatment.  Past Medical History:  Diagnosis Date  . Adenocarcinoma of lung ([place])    Left lung 2009, resected  . Anginal pain ([place])   . Arthritis   . Back pain   . CHF (congestive heart failure) ([place])   . COPD (chronic obstructive pulmonary disease) ([place])   . Diverticulitis   . Dyslipidemia   . Essential hypertension   . H/O ventral hernia   . Non-obstructive CAD    a. 04/2015 NSTEMI/Cath: LAD 10p, LCX 57m, RCA 5m, 20d, EF 35-40 w/ apical ballooning.  . On home O2    3L N/C  . Osteoporosis   . Osteoporosis 11/04/2015   Managed by Dr. Legrand Rams   . Parkinson's disease ([place])   . Shortness of breath   . Takotsubo cardiomyopathy    a. 04/2015 Echo: EF 45-50%, mid-dist anterior/apical/inferoapical HK w/ hyperdynamic base. Gr 1 DD, mild AI, mild-mod MR, triv TR, PASP 82mmHg;  b. 04/2015 LV gram: Ef 35-40% w/ apical ballooning.  . Type II diabetes mellitus ([place])   . Ventricular bigeminy    a. 04/2015 in setting of NSTEMI/Takotsubo.    Past Surgical History:  Procedure Laterality Date  . ABDOMINAL HYSTERECTOMY    . CARDIAC CATHETERIZATION N/A 04/17/2015   Procedure: Left Heart Cath and Coronary Angiography;  Surgeon: Troy Sine, MD;  Location: [facility] CV LAB;  Service: Cardiovascular;  Laterality: N/A;  . CHOLECYSTECTOMY    . COLONOSCOPY N/A 09/18/2014   Procedure: COLONOSCOPY;  Surgeon: Danie Binder, MD;  Location: [facility] SUITE;  Service: Endoscopy;  Laterality: N/A;  8:30 AM - moved to 10:30  Rosendo Gros to notify pt  . ECTOPIC PREGNANCY SURGERY    . INCISIONAL HERNIA REPAIR N/A 08/26/2013   Procedure: HERNIA REPAIR INCISIONAL WITH MESH;  Surgeon: Jamesetta So, MD;  Location: [facility] ORS;  Service: General;  Laterality: N/A;  . LUNG CANCER SURGERY    . VIDEO BRONCHOSCOPY WITH ENDOBRONCHIAL NAVIGATION N/A 04/23/2017   Procedure: VIDEO BRONCHOSCOPY WITH ENDOBRONCHIAL NAVIGATION;  Surgeon: Melrose Nakayama, MD;  Location: [facility];  Service: Thoracic;  Laterality: N/A;  . VIDEO BRONCHOSCOPY WITH ENDOBRONCHIAL ULTRASOUND N/A 04/23/2017   Procedure: VIDEO BRONCHOSCOPY WITH ENDOBRONCHIAL ULTRASOUND;  Surgeon: Melrose Nakayama, MD;  Location: [facility] OR;  Service: Thoracic;  Laterality: N/A;    Allergies: Lisinopril  Medications: Prior to Admission medications   Medication Sig Start Date End Date Taking? Authorizing Provider  albuterol (PROAIR HFA) 108 (90 BASE) MCG/ACT inhaler Inhale 2 puffs into the lungs every 4 (four) hours as needed for wheezing or shortness of breath.   Yes [provider]  aspirin EC 81 MG tablet Take 81 mg by mouth daily.   Yes [provider]  atorvastatin (LIPITOR) 40 MG tablet Take 40 mg by mouth at bedtime.    Yes [provider]  buPROPion (WELLBUTRIN SR) 150 MG 12 hr tablet Take 150 mg by mouth 2 (two) times daily.   Yes [provider]  cholecalciferol (VITAMIN D) 1000 units tablet Take 1,000 Units by mouth  daily.   Yes [provider]  furosemide (LASIX) 40 MG tablet Take 40 mg by mouth daily. Pt is able to take one additional tablet daily for weight increases of 3lbs in a day or 5lbs in a week.   Yes [provider]  gabapentin (NEURONTIN) 300 MG capsule Take 600 mg by mouth at bedtime.    Yes [provider]  Garlic 671 MG TABS Take 300 mg by mouth daily.   Yes [provider]  ipratropium-albuterol (DUONEB) 0.5-2.5 (3) MG/3ML SOLN Inhale 3 mLs into the lungs every 6 (six) hours as  needed (for wheezing/shortness of breath).    Yes [provider]  linagliptin (TRADJENTA) 5 MG TABS tablet Take 5 mg by mouth daily.   Yes [provider]  loratadine (CLARITIN) 10 MG tablet Take 10 mg by mouth daily.   Yes [provider]  losartan (COZAAR) 50 MG tablet Take 50 mg by mouth daily.   Yes [provider]  meclizine (ANTIVERT) 25 MG tablet Take 25 mg by mouth daily.    Yes [provider]  metoprolol succinate (TOPROL-XL) 25 MG 24 hr tablet Take 1 tablet (25 mg total) by mouth daily. 06/26/15  Yes Jettie Booze, MD  nitroGLYCERIN (NITROSTAT) 0.4 MG SL tablet Place 0.4 mg under the tongue every 5 (five) minutes as needed for chest pain.   Yes [provider]  omega-3 acid ethyl esters (LOVAZA) 1 g capsule Take 1 g by mouth 2 (two) times daily.   Yes [provider]  omeprazole (PRILOSEC) 40 MG capsule Take 40 mg by mouth daily.   Yes [provider]  potassium chloride (K-DUR) 10 MEQ tablet Take 1 tablet (10 mEq total) by mouth daily. 05/21/16  Yes Isaiah Serge, NP  predniSONE (DELTASONE) 20 MG tablet 3 tabs po day one, then 2 po daily x 4 days 04/26/17  Yes Julianne Rice, MD  pyridoxine (B-6) 100 MG tablet Take 100 mg by mouth daily.    Yes [provider]  ropinirole (REQUIP) 5 MG tablet Take 5 mg by mouth 3 (three) times daily.   Yes [provider]  sodium chloride (OCEAN) 0.65 % SOLN nasal spray Place 1 spray into both nostrils every 3 (three) hours as needed for congestion.    Yes [provider]  traMADol (ULTRAM) 50 MG tablet Take 50 mg by mouth every 12 (twelve) hours as needed for moderate pain.    Yes [provider]  zolpidem (AMBIEN) 5 MG tablet Take 5 mg by mouth at bedtime as needed for sleep.   Yes [provider]  alendronate (FOSAMAX) 70 MG tablet Take 70 mg by mouth every Friday.     [provider]  LANTUS SOLOSTAR 100 UNIT/ML  Solostar Pen Inject 15 Units into the skin at bedtime.     [provider]     Family History  Problem Relation Age of Onset  . Diabetes Mother   . Hypertension Mother   . Diabetes Father   . Asthma Unknown   . Cancer Unknown   . Heart attack Sister        X2  . Colon cancer Neg Hx     Social History   Social History  . Marital status: Widowed    Spouse name: N/A  . Number of children: N/A  . Years of education: N/A   Social History Main Topics  . Smoking status: Former Smoker    Years: 20.00  Types: Cigarettes    Quit date: 08/13/2006  . Smokeless tobacco: Never Used  . Alcohol use No  . Drug use: No  . Sexual activity: Not Currently    Birth control/ protection: Surgical   Other Topics Concern  . None   Social History Narrative  . None     Review of Systems denies fever, headache, chest pain, abdominal pain, back pain, nausea, vomiting or abnormal bleeding. She does have occasional dyspnea, cough, and tremors secondary to Parkinson's.  Vital Signs: BP (!) 167/56   Pulse 65   Temp 98 F (36.7 C) (Oral)   Resp 18   Ht 5\' 4"  (1.626 m)   Wt 173 lb (78.5 kg)   SpO2 100%   BMI 29.70 kg/m   Physical Exam awake, alert. Chest with distant breath sounds bilaterally. Heart with regular rate and rhythm. Abdomen soft, positive bowel sounds, nontender. No lower extremity edema.  Mallampati Score:     Imaging: Dg Chest 2 View  Result Date: 04/26/2017 CLINICAL DATA:  Shortness of breath since pulmonary procedure yesterday. History of lung cancer. EXAM: CHEST  2 VIEW COMPARISON:  04/21/2017 and chest CT 03/25/2017 FINDINGS: Lungs are adequately inflated with chronic elevation and lateralization of the left hemidiaphragm. Mild hazy opacification of the right base unchanged. Mild stable cardiomegaly. Calcified plaque present over the aortic arch. There are degenerative changes of the spine and shoulders. Opacification of the posterior lung bases on the lateral  film without significant change, although possible cavitary component. IMPRESSION: Stable right base opacification as well as stable opacification over the posterior lung bases on the lateral film. Possible cavitary component to the posterior basilar process. Chronic stable changes left base. Stable cardiomegaly. Aortic atherosclerosis. Electronically Signed   By: Marin Olp M.D.   On: 04/26/2017 20:29   Dg Chest 2 View  Result Date: 04/21/2017 CLINICAL DATA:  Preoperative evaluation for videoscopic bronchoscopy procedure EXAM: CHEST  2 VIEW COMPARISON:  April 04, 2017 chest radiograph and chest CT FINDINGS: There is volume loss in the left base with patchy airspace opacity, stable. A nodular opacity in the right lower lobe well seen on CT is present but subtle on chest radiograph. This nodular opacity measures just over 1 cm. Heart size and pulmonary vascular normal. There is aortic atherosclerosis. No adenopathy. There is degenerative change in each shoulder and in the thoracic spine. There is old rib trauma on the left with remodeling. IMPRESSION: Nodular opacity right lower lobe well seen on recent CT is subtle on chest radiograph. Persistent volume loss with patchy opacity left base, stable. Aortic atherosclerosis. No adenopathy. Electronically Signed   By: Lowella Grip III M.D.   On: 04/21/2017 12:10   Dg Chest 2 View  Result Date: 04/04/2017 CLINICAL DATA:  Nausea vomiting.  Epigastric pain. EXAM: CHEST  2 VIEW COMPARISON:  PET-CT 03/25/2017 FINDINGS: Cardiomediastinal silhouette is normal. Mediastinal contours appear intact. Tortuosity and calcific atherosclerotic disease of the aorta. There is no evidence of pneumothorax. There is a left lower lobe airspace consolidation. Previously demonstrated by CT central peribronchial masses in the right lower lobe projects as nodular and linear opacities. Mild emphysematous changes. Osseous structures are without acute abnormality. Soft tissues are  grossly normal. IMPRESSION: Left lower lobe airspace consolidation, which likely represents the ground-glass opacity seen by recent CT. Known right lower lobe central peribronchial masses. No evidence of new focal airspace consolidation. Atherosclerotic disease of the aorta. Electronically Signed   By: Fidela Salisbury M.D.   On:  04/04/2017 16:56   Dg C-arm Bronchoscopy  Result Date: 04/23/2017 C-ARM BRONCHOSCOPY: Fluoroscopy was utilized by the requesting physician.  No radiographic interpretation.    Labs:  CBC:  Recent Labs  03/22/17 1235 04/04/17 1530 04/21/17 1134 04/26/17 1859  WBC 7.4 8.2 6.7 8.5  HGB 12.1 12.5 10.9* 10.8*  HCT 38.4 39.8 35.1* 34.0*  PLT 212 251 194 193    COAGS:  Recent Labs  05/18/16 1930 04/21/17 1134 04/30/17 0653  INR 1.05 1.13 0.99  APTT  --  31 25    BMP:  Recent Labs  04/05/17 0611 04/06/17 0224 04/21/17 1134 04/26/17 1859  NA 136 137 139 142  K 4.5 4.8 4.5 4.6  CL 98* 102 103 105  CO2 32 31 29 30   GLUCOSE 98 97 111* 111*  BUN 36* 23* 16 16  CALCIUM 10.1 9.2 9.3 10.1  CREATININE 2.19* 1.69* 1.28* 1.44*  GFRNONAA 20* 27* 38* 33*  GFRAA 23* 32* 44* 38*    LIVER FUNCTION TESTS:  Recent Labs  11/06/16 1115 04/04/17 1530 04/21/17 1134 04/26/17 1859  BILITOT 0.4 0.4 0.3 0.5  AST 16 30 20 19   ALT 16 28 16 15   ALKPHOS 53 56 54 51  PROT 7.3 7.9 6.9 7.0  ALBUMIN 3.7 4.2 3.7 3.7    TUMOR MARKERS: No results for input(s): AFPTM, CEA, CA199, CHROMGRNA in the last 8760 hours.  Assessment and Plan: Patient with history of recurrent poorly differentiated adenocarcinoma the lung; presents today for Port-A-Cath placement for additional treatment.Risks and benefits discussed with the patient/daughter including, but not limited to bleeding, infection, pneumothorax, or fibrin sheath development and need for additional procedures.All of the patient's questions were answered, patient is agreeable to proceed.Consent signed and in  chart.     Thank you for this interesting consult.  I greatly enjoyed meeting JESENYA BOWDITCH and look forward to participating in their care.  A copy of this report was sent to the requesting provider on this date.  Electronically Signed: D. Rowe Robert, PA-C 04/30/2017, 8:24 AM   I spent a total of  20 minutes   in face to face in clinical consultation, greater than 50% of which was counseling/coordinating care for Port-A-Cath placement

## 2017-05-11 NOTE — Addendum Note (Signed)
Addendum  created 05/11/17 1529 by Oleta Mouse, MD   Sign clinical note

## 2017-05-12 ENCOUNTER — Encounter: Payer: Self-pay | Admitting: *Deleted

## 2017-05-12 NOTE — Progress Notes (Signed)
[facility] Clinical Social Work  Clinical Social Work was referred by patient's daughter, Angela Lawson for assessment of psychosocial needs due to possible care needs at home. Clinical Social Worker contacted daughter of patient at home to offer support and assess for needs. CSW introduced self, explained role of CSW/Pt and Family Support Team, support groups and other resources to assist. Daughter reports pt lives alone and daughter works 5 days on, 5 days off. She reports concerns with how pt will tolerate treatment and feels she will need assistance. CSW explained lack of resources covered by insurance for "custodial" care. CSW attempted to problem solve with daughter for possible CNA options to assist in the early evening or mornings. CSW also discussed possible community resources available. Pt is quite a bit over the line to be eligible for medicaid. Daughter encouraged to reach out as needed.       Clinical Social Work interventions: Resource education and referral   Loren Racer, LCSW, OSW-C [place] Tuesdays   [phone]

## 2017-05-13 ENCOUNTER — Encounter (HOSPITAL_COMMUNITY): Payer: Self-pay

## 2017-05-15 ENCOUNTER — Encounter (HOSPITAL_COMMUNITY): Payer: Self-pay | Admitting: Hematology

## 2017-05-15 ENCOUNTER — Emergency Department (HOSPITAL_COMMUNITY)
Admission: EM | Admit: 2017-05-15 | Discharge: 2017-05-16 | Disposition: A | Discharge status: Home / Self Care | Payer: Medicare Other | Attending: Emergency Medicine | Admitting: Emergency Medicine

## 2017-05-15 ENCOUNTER — Encounter (HOSPITAL_COMMUNITY): Payer: Medicare Other | Attending: Hematology & Oncology | Admitting: Hematology

## 2017-05-15 ENCOUNTER — Other Ambulatory Visit: Payer: Self-pay | Admitting: Cardiology

## 2017-05-15 ENCOUNTER — Encounter (HOSPITAL_COMMUNITY): Payer: Self-pay | Admitting: Emergency Medicine

## 2017-05-15 ENCOUNTER — Emergency Department (HOSPITAL_COMMUNITY): Payer: Medicare Other

## 2017-05-15 VITALS — BP 139/61 | HR 96 | Temp 98.0°F | Resp 18 | Wt 176.4 lb

## 2017-05-15 DIAGNOSIS — I509 Heart failure, unspecified: Secondary | ICD-10-CM | POA: Insufficient documentation

## 2017-05-15 DIAGNOSIS — I130 Hypertensive heart and chronic kidney disease with heart failure and stage 1 through stage 4 chronic kidney disease, or unspecified chronic kidney disease: Secondary | ICD-10-CM | POA: Insufficient documentation

## 2017-05-15 DIAGNOSIS — Z794 Long term (current) use of insulin: Secondary | ICD-10-CM | POA: Diagnosis not present

## 2017-05-15 DIAGNOSIS — C3492 Malignant neoplasm of unspecified part of left bronchus or lung: Secondary | ICD-10-CM

## 2017-05-15 DIAGNOSIS — M79675 Pain in left toe(s): Secondary | ICD-10-CM | POA: Diagnosis present

## 2017-05-15 DIAGNOSIS — G20 Parkinson's disease: Secondary | ICD-10-CM | POA: Insufficient documentation

## 2017-05-15 DIAGNOSIS — Z7982 Long term (current) use of aspirin: Secondary | ICD-10-CM | POA: Diagnosis not present

## 2017-05-15 DIAGNOSIS — Z79899 Other long term (current) drug therapy: Secondary | ICD-10-CM | POA: Diagnosis not present

## 2017-05-15 DIAGNOSIS — C3431 Malignant neoplasm of lower lobe, right bronchus or lung: Secondary | ICD-10-CM

## 2017-05-15 DIAGNOSIS — E1122 Type 2 diabetes mellitus with diabetic chronic kidney disease: Secondary | ICD-10-CM | POA: Insufficient documentation

## 2017-05-15 DIAGNOSIS — Z87891 Personal history of nicotine dependence: Secondary | ICD-10-CM | POA: Diagnosis not present

## 2017-05-15 DIAGNOSIS — Z7189 Other specified counseling: Secondary | ICD-10-CM

## 2017-05-15 DIAGNOSIS — M86172 Other acute osteomyelitis, left ankle and foot: Secondary | ICD-10-CM | POA: Insufficient documentation

## 2017-05-15 DIAGNOSIS — C3412 Malignant neoplasm of upper lobe, left bronchus or lung: Secondary | ICD-10-CM

## 2017-05-15 DIAGNOSIS — N183 Chronic kidney disease, stage 3 (moderate): Secondary | ICD-10-CM | POA: Diagnosis not present

## 2017-05-15 DIAGNOSIS — J449 Chronic obstructive pulmonary disease, unspecified: Secondary | ICD-10-CM | POA: Diagnosis not present

## 2017-05-15 DIAGNOSIS — M79672 Pain in left foot: Secondary | ICD-10-CM | POA: Diagnosis not present

## 2017-05-15 LAB — CBC WITH DIFFERENTIAL/PLATELET
Basophils Absolute: 0 10*3/uL (ref 0.0–0.1)
Basophils Relative: 0 %
Eosinophils Absolute: 0.2 10*3/uL (ref 0.0–0.7)
Eosinophils Relative: 3 %
HCT: 34.7 % — ABNORMAL LOW (ref 36.0–46.0)
Hemoglobin: 10.9 g/dL — ABNORMAL LOW (ref 12.0–15.0)
Lymphocytes Relative: 25 %
Lymphs Abs: 2 10*3/uL (ref 0.7–4.0)
MCH: 30 pg (ref 26.0–34.0)
MCHC: 31.4 g/dL (ref 30.0–36.0)
MCV: 95.6 fL (ref 78.0–100.0)
Monocytes Absolute: 1 10*3/uL (ref 0.1–1.0)
Monocytes Relative: 12 %
Neutro Abs: 4.8 10*3/uL (ref 1.7–7.7)
Neutrophils Relative %: 60 %
Platelets: 189 10*3/uL (ref 150–400)
RBC: 3.63 MIL/uL — ABNORMAL LOW (ref 3.87–5.11)
RDW: 14.7 % (ref 11.5–15.5)
WBC: 7.9 10*3/uL (ref 4.0–10.5)

## 2017-05-15 LAB — BASIC METABOLIC PANEL
Anion gap: 10 (ref 5–15)
BUN: 24 mg/dL — ABNORMAL HIGH (ref 6–20)
CO2: 27 mmol/L (ref 22–32)
Calcium: 8.9 mg/dL (ref 8.9–10.3)
Chloride: 100 mmol/L — ABNORMAL LOW (ref 101–111)
Creatinine, Ser: 1.63 mg/dL — ABNORMAL HIGH (ref 0.44–1.00)
GFR calc Af Amer: 33 mL/min — ABNORMAL LOW (ref >60)
GFR calc non Af Amer: 28 mL/min — ABNORMAL LOW (ref >60)
Glucose, Bld: 165 mg/dL — ABNORMAL HIGH (ref 65–99)
Potassium: 4.3 mmol/L (ref 3.5–5.1)
Sodium: 137 mmol/L (ref 135–145)

## 2017-05-15 LAB — SEDIMENTATION RATE: Sed Rate: 28 mm/hr — ABNORMAL HIGH (ref 0–22)

## 2017-05-15 MED ORDER — POTASSIUM CHLORIDE ER 10 MEQ PO TBCR: 10.0000 meq | EXTENDED_RELEASE_TABLET | Freq: Every day | ORAL | 0 refills | Status: DC

## 2017-05-15 MED ORDER — VANCOMYCIN HCL IN DEXTROSE 1-5 GM/200ML-% IV SOLN
1000.0000 mg | Freq: Once | INTRAVENOUS | Status: AC
  Administered 2017-05-15: 1000 mg via INTRAVENOUS
  Filled 2017-05-15: qty 200

## 2017-05-15 MED ORDER — MUPIROCIN CALCIUM 2 % EX CREA: 1.0000 "application " | TOPICAL_CREAM | Freq: Two times a day (BID) | CUTANEOUS | 0 refills | Status: DC

## 2017-05-15 MED ORDER — DOXYCYCLINE HYCLATE 100 MG PO CAPS: 100.0000 mg | ORAL_CAPSULE | Freq: Two times a day (BID) | ORAL | 0 refills | Status: DC

## 2017-05-15 NOTE — ED Triage Notes (Signed)
Had toenail removed several months ago Now with ? Pus from toenail  Had toenail removed by podiatrist  Dr Legrand Rams is PCP

## 2017-05-15 NOTE — Patient Instructions (Signed)
[facility] Discharge Instructions  RECOMMENDATIONS MADE BY THE CONSULTANT AND ANY TEST RESULTS WILL BE SENT TO YOUR REFERRING PHYSICIAN.  You saw Dr. Kale today.  Thank you for choosing [facility] at [facility] to provide your oncology and hematology care.  To afford each patient quality time with our provider, please arrive at least 15 minutes before your scheduled appointment time.    If you have a lab appointment with the Cancer Center please come in thru the  Main Entrance and check in at the main information desk  You need to re-schedule your appointment should you arrive 10 or more minutes late.  We strive to give you quality time with our providers, and arriving late affects you and other patients whose appointments are after yours.  Also, if you no show three or more times for appointments you may be dismissed from the clinic at the providers discretion.     Again, thank you for choosing [facility].  Our hope is that these requests will decrease the amount of time that you wait before being seen by our physicians.       _____________________________________________________________  Should you have questions after your visit to  Cancer Center, please contact our office at [phone] between the hours of 8:30 a.m. and 4:30 p.m.  Voicemails left after 4:30 p.m. will not be returned until the following business day.  For prescription refill requests, have your pharmacy contact our office.       Resources For Cancer Patients and their Caregivers ? American Cancer Society: Can assist with transportation, wigs, general needs, runs Look Good Feel Better.        1-888-227-6333 ? Cancer Care: Provides financial assistance, online support groups, medication/co-pay assistance.  1-800-813-HOPE (4673) ? [facility] Assists [place] cancer patients and their families through  emotional , educational and financial support.  336-427-4357 ? [place] DSS Where to apply for food stamps, Medicaid and utility assistance. 336-342-1394 ? [facility]: Transportation to medical appointments. 336-347-2287 ? Social Security Administration: May apply for disability if have a Stage IV cancer. 336-342-7796 1-800-772-1213 ? [place] Aging, Disability and Transit Services: Assists with nutrition, care and transit needs. 336-349-2343  Cancer Center Support Programs: @[email or web address]@ > Cancer Support Group  2nd Tuesday of the month 1pm-2pm, Journey Room  > Creative Journey  3rd Tuesday of the month 1130am-1pm, Journey Room  > Look Good Feel Better  1st Wednesday of the month 10am-12 noon, Journey Room (Call American Cancer Society to register 1-800-395-5775)    

## 2017-05-15 NOTE — Progress Notes (Signed)
START OFF PATHWAY REGIMEN - Non-Small Cell Lung   [id number]:Atezolizumab 1200 mg q21 Days:   A cycle is every 21 days:     Atezolizumab   **Always confirm dose/schedule in your pharmacy ordering system**    Patient Characteristics: Stage IV Metastatic, Non Squamous, Initial Chemotherapy/Immunotherapy, PS = 3, 4 AJCC T Category: TX Current Disease Status: Distant Metastases AJCC N Category: NX AJCC M Category: M1a AJCC 8 Stage Grouping: IVA Histology: Non Squamous Cell ROS1 Rearrangement Status: Negative T790M Mutation Status: Not Applicable - EGFR Mutation Negative/Unknown Other Mutations/Biomarkers: No Other Actionable Mutations PD-L1 Expression Status: PD-L1 Negative Chemotherapy/Immunotherapy LOT: Initial Chemotherapy/Immunotherapy Molecular Targeted Therapy: Not Appropriate ALK Translocation Status: Negative Would you be surprised if this patient died  in the next year? I would NOT be surprised if this patient died in the next year EGFR Mutation Status: Negative/Wild Type BRAF V600E Mutation Status: Negative Performance Status: PS = 3, 4 Intent of Therapy: Non-Curative / Palliative Intent, Discussed with Patient

## 2017-05-15 NOTE — Progress Notes (Signed)
Patient on plan of care prior to pathways. 

## 2017-05-15 NOTE — ED Notes (Signed)
Went to do vital nurse in drawing blood

## 2017-05-15 NOTE — ED Provider Notes (Signed)
[facility] DEPT Provider Note   CSN: [id number] Arrival date & time: 05/15/17  1855     History   Chief Complaint Chief Complaint  Patient presents with  . Toe Pain    toenail removed now not healing    HPI Angela Lawson is a 83 y.o. female with a past medical history significant for Type II DM, COPD, HTN  And CAD presenting with a chronic drainage and toe that just will not heal since the nail was removed in early April.  She states it was feeling better for a while but has started to drain clear to yellow fluid.  She has been unconcerned, but daughters saw the toe today and made her come for evaluation.  She initially was using warm epsom salts after the nail was removed but more recently has had no treatment of the site.  The history is provided by the patient and a relative.    Past Medical History:  Diagnosis Date  . Adenocarcinoma of lung ([place])    Left lung 2009, resected  . Anginal pain ([place])   . Arthritis   . Back pain   . CHF (congestive heart failure) ([place])   . COPD (chronic obstructive pulmonary disease) ([place])   . Diverticulitis   . Dyslipidemia   . Essential hypertension   . H/O ventral hernia   . Non-obstructive CAD    a. 04/2015 NSTEMI/Cath: LAD 10p, LCX 71m, RCA 9m, 20d, EF 35-40 w/ apical ballooning.  . On home O2    3L N/C  . Osteoporosis   . Osteoporosis 11/04/2015   Managed by Dr. Legrand Rams   . Parkinson's disease ([place])   . Shortness of breath   . Takotsubo cardiomyopathy    a. 04/2015 Echo: EF 45-50%, mid-dist anterior/apical/inferoapical HK w/ hyperdynamic base. Gr 1 DD, mild AI, mild-mod MR, triv TR, PASP 96mmHg;  b. 04/2015 LV gram: Ef 35-40% w/ apical ballooning.  . Type II diabetes mellitus ([place])   . Ventricular bigeminy    a. 04/2015 in setting of NSTEMI/Takotsubo.    Patient Active Problem List   Diagnosis Date Noted  . Counseling regarding advanced care planning and goals of care 05/15/2017  . Acute renal injury ([place]) 04/04/2017  . Chest  pain 04/04/2017  . Nausea and vomiting 04/04/2017  . COPD with acute exacerbation ([place]) 04/25/2016  . Acute on chronic respiratory failure with hypoxia ([place]) 04/25/2016  . SOB (shortness of breath)   . Osteoporosis 11/04/2015  . CHF (congestive heart failure) ([place]) 08/01/2015  . CHF exacerbation ([place]) 07/09/2015  . Essential hypertension 04/19/2015  . Type II diabetes mellitus ([place]) 04/19/2015  . Dyslipidemia 04/19/2015  . History of lung cancer 04/19/2015  . Takotsubo cardiomyopathy 04/19/2015  . Ventricular bigeminy 04/19/2015  . NSTEMI (non-ST elevated myocardial infarction) ([place]) 04/19/2015  . Stress-induced cardiomyopathy 04/18/2015  . Elevated troponin 04/16/2015  . COPD exacerbation ([place]) 04/16/2015  . CKD (chronic kidney disease) stage 3, GFR 30-59 ml/min 04/16/2015  . Herpes genitalis in women 11/24/2014  . Toe fracture 09/28/2014  . Non-traumatic tear of right rotator cuff 11/30/2013  . Pain in joint, shoulder region 11/30/2013  . Muscle weakness (generalized) 11/30/2013  . Bursitis of left shoulder 02/09/2013  . Adenocarcinoma of lung ([place]) 08/20/2012    Past Surgical History:  Procedure Laterality Date  . ABDOMINAL HYSTERECTOMY    . CARDIAC CATHETERIZATION N/A 04/17/2015   Procedure: Left Heart Cath and Coronary Angiography;  Surgeon: Troy Sine, MD;  Location: [facility];  Service: Cardiovascular;  Laterality: N/A;  . CHOLECYSTECTOMY    . COLONOSCOPY N/A 09/18/2014   Procedure: COLONOSCOPY;  Surgeon: Danie Binder, MD;  Location: [facility];  Service: Endoscopy;  Laterality: N/A;  8:30 AM - moved to 10:30 Rosendo Gros to notify pt  . ECTOPIC PREGNANCY SURGERY    . INCISIONAL HERNIA REPAIR N/A 08/26/2013   Procedure: HERNIA REPAIR INCISIONAL WITH MESH;  Surgeon: Jamesetta So, MD;  Location: [facility];  Service: General;  Laterality: N/A;  . IR FLUORO GUIDE PORT INSERTION RIGHT  04/30/2017  . IR US GUIDE VASC ACCESS RIGHT  04/30/2017  . LUNG CANCER SURGERY     . VIDEO BRONCHOSCOPY WITH ENDOBRONCHIAL NAVIGATION N/A 04/23/2017   Procedure: VIDEO BRONCHOSCOPY WITH ENDOBRONCHIAL NAVIGATION;  Surgeon: Melrose Nakayama, MD;  Location: [facility];  Service: Thoracic;  Laterality: N/A;  . VIDEO BRONCHOSCOPY WITH ENDOBRONCHIAL ULTRASOUND N/A 04/23/2017   Procedure: VIDEO BRONCHOSCOPY WITH ENDOBRONCHIAL ULTRASOUND;  Surgeon: Melrose Nakayama, MD;  Location: [facility];  Service: Thoracic;  Laterality: N/A;    OB History    No data available       Home Medications    Prior to Admission medications   Medication Sig Start Date End Date Taking? Authorizing Provider  albuterol (PROAIR HFA) 108 (90 BASE) MCG/ACT inhaler Inhale 2 puffs into the lungs every 4 (four) hours as needed for wheezing or shortness of breath.   Yes [provider]  alendronate (FOSAMAX) 70 MG tablet Take 70 mg by mouth every Friday.    Yes [provider]  aspirin EC 81 MG tablet Take 81 mg by mouth daily.   Yes [provider]  atorvastatin (LIPITOR) 40 MG tablet Take 40 mg by mouth at bedtime.    Yes [provider]  buPROPion (WELLBUTRIN SR) 150 MG 12 hr tablet Take 150 mg by mouth 2 (two) times daily.   Yes [provider]  cholecalciferol (VITAMIN D) 1000 units tablet Take 1,000 Units by mouth daily.   Yes [provider]  furosemide (LASIX) 40 MG tablet Take 40 mg by mouth daily. Pt is able to take one additional tablet daily for weight increases of 3lbs in a day or 5lbs in a week.   Yes [provider]  gabapentin (NEURONTIN) 300 MG capsule Take 600 mg by mouth at bedtime.    Yes [provider]  Garlic 527 MG TABS Take 300 mg by mouth daily.   Yes [provider]  ipratropium-albuterol (DUONEB) 0.5-2.5 (3) MG/3ML SOLN Inhale 3 mLs into the lungs every 6 (six) hours as needed (for wheezing/shortness of breath).    Yes [provider]  LANTUS SOLOSTAR 100 UNIT/ML Solostar Pen Inject 15 Units  into the skin at bedtime.    Yes [provider]  linagliptin (TRADJENTA) 5 MG TABS tablet Take 5 mg by mouth daily.   Yes [provider]  loratadine (CLARITIN) 10 MG tablet Take 10 mg by mouth daily.   Yes [provider]  losartan (COZAAR) 50 MG tablet Take 50 mg by mouth daily.   Yes [provider]  meclizine (ANTIVERT) 25 MG tablet Take 25 mg by mouth daily.    Yes [provider]  metoprolol succinate (TOPROL-XL) 25 MG 24 hr tablet Take 1 tablet (25 mg total) by mouth daily. 06/26/15  Yes Jettie Booze, MD  nitroGLYCERIN (NITROSTAT) 0.4 MG SL tablet Place 0.4 mg under the tongue every 5 (five) minutes as  needed for chest pain.   Yes [provider]  Omega-3 Fatty Acids (FISH OIL PO) Take 1 capsule by mouth daily.   Yes [provider]  omeprazole (PRILOSEC) 40 MG capsule Take 40 mg by mouth daily.   Yes [provider]  potassium chloride (K-DUR) 10 MEQ tablet Take 1 tablet (10 mEq total) by mouth daily. 05/21/16  Yes Isaiah Serge, NP  potassium chloride (K-DUR) 10 MEQ tablet Take 1 tablet (10 mEq total) by mouth daily. *Further refills need to be authorized by PCP* 05/15/17 08/13/17 Yes Isaiah Serge, NP  pyridoxine (B-6) 100 MG tablet Take 100 mg by mouth daily.    Yes [provider]  ropinirole (REQUIP) 5 MG tablet Take 5 mg by mouth 3 (three) times daily.   Yes [provider]  sodium chloride (OCEAN) 0.65 % SOLN nasal spray Place 1 spray into both nostrils every 3 (three) hours as needed for congestion.    Yes [provider]  traMADol (ULTRAM) 50 MG tablet Take 50 mg by mouth every 12 (twelve) hours as needed for moderate pain.    Yes [provider]  zolpidem (AMBIEN) 5 MG tablet Take 5 mg by mouth at bedtime as needed for sleep.   Yes [provider]  doxycycline (VIBRAMYCIN) 100 MG capsule Take 1 capsule (100 mg total) by mouth 2 (two) times daily. 05/15/17   Evalee Jefferson, PA-C  mupirocin cream (BACTROBAN) 2 % Apply 1 application topically 2 (two) times daily. 05/15/17   Evalee Jefferson, PA-C  predniSONE (DELTASONE) 20 MG tablet 3 tabs po day one, then 2 po daily x 4 days Patient not taking: Reported on 05/15/2017 04/26/17   Julianne Rice, MD    Family History Family History  Problem Relation Age of Onset  . Diabetes Mother   . Hypertension Mother   . Diabetes Father   . Asthma Unknown   . Cancer Unknown   . Heart attack Sister        X2  . Colon cancer Neg Hx     Social History Social History  Substance Use Topics  . Smoking status: Former Smoker    Years: 20.00    Types: Cigarettes    Quit date: 08/13/2006  . Smokeless tobacco: Never Used  . Alcohol use No     Allergies   Lisinopril   Review of Systems Review of Systems  Constitutional: Negative for fever.  Musculoskeletal: Positive for arthralgias and joint swelling. Negative for myalgias.  Neurological: Negative for weakness and numbness.     Physical Exam Updated Vital Signs BP (!) 121/48 (BP Location: Right Arm)   Pulse 74   Temp 97.7 F (36.5 C) (Oral)   Resp 16   Ht 5\' 4"  (1.626 m)   Wt 79.8 kg (176 lb)   SpO2 99%   BMI 30.21 kg/m   Physical Exam   ED Treatments / Results  Labs (all labs ordered are listed, but only abnormal results are displayed) Labs Reviewed  CBC WITH DIFFERENTIAL/PLATELET - Abnormal; Notable for the following:       Result Value   RBC 3.63 (*)    Hemoglobin 10.9 (*)    HCT 34.7 (*)    All other components within normal limits  BASIC METABOLIC PANEL - Abnormal; Notable for the following:    Chloride 100 (*)    Glucose, Bld 165 (*)    BUN 24 (*)    Creatinine, Ser 1.63 (*)  GFR calc non Af Amer 28 (*)    GFR calc Af Amer 33 (*)    All other components within normal limits  SEDIMENTATION RATE - Abnormal; Notable for the following:    Sed Rate 28 (*)    All other components within normal limits  C-REACTIVE PROTEIN - Abnormal;  Notable for the following:    CRP 2.1 (*)    All other components within normal limits    EKG  EKG Interpretation None       Radiology Dg Toe Great Left  Result Date: 05/15/2017 CLINICAL DATA:  Pain and drainage from left great toe. History of diabetes. EXAM: LEFT GREAT TOE COMPARISON:  None. FINDINGS: There is some bony indistinctness/lucency of the lateral proximal aspect of the great toe distal phalanx. Osteomyelitis is not excluded. Correlate clinically with location of pain and drainage. No subluxation or dislocation identified. IMPRESSION: Bony indistinctness and lucency along the lateral proximal aspect of the great toe distal phalanx. Correlate clinically as osteomyelitis is not entirely excluded. Electronically Signed   By: Margarette Canada M.D.   On: 05/15/2017 20:23    Procedures Procedures (including critical care time)  Medications Ordered in ED Medications  vancomycin (VANCOCIN) IVPB 1000 mg/200 mL premix (0 mg Intravenous Stopped 05/15/17 2219)     Initial Impression / Assessment and Plan / ED Course  I have reviewed the triage vital signs and the nursing notes.  Pertinent labs & imaging results that were available during my care of the patient were reviewed by me and considered in my medical decision making (see chart for details).     Pt also seen by Dr. Lacinda Axon during todays visit.  Discussed with Dr. Sharol Given who will follow in close office f/u. Advised doxycycline, topical bactroban bid.  Pt to call for ov, she and family aware of plan.  Labs reviewed, pt with chronic renal insufficiency, not new today, other labs stable.  She was given a dose of IV vanc while here. Sed rate, crp pending.    Final Clinical Impressions(s) / ED Diagnoses   Final diagnoses:  Other acute osteomyelitis of left foot [facility])    New Prescriptions Discharge Medication List as of 05/15/2017 11:37 PM    START taking these medications   Details  doxycycline (VIBRAMYCIN) 100 MG capsule Take 1  capsule (100 mg total) by mouth 2 (two) times daily., Starting Fri 05/15/2017, Print    mupirocin cream (BACTROBAN) 2 % Apply 1 application topically 2 (two) times daily., Starting Fri 05/15/2017, Print         Evalee Jefferson, PA-C 05/16/17 1224    Nat Christen, MD 05/16/17 1524

## 2017-05-16 LAB — C-REACTIVE PROTEIN: CRP: 2.1 mg/dL — ABNORMAL HIGH (ref <1.0)

## 2017-05-19 ENCOUNTER — Encounter (HOSPITAL_COMMUNITY): Payer: Self-pay

## 2017-05-19 ENCOUNTER — Encounter (INDEPENDENT_AMBULATORY_CARE_PROVIDER_SITE_OTHER): Payer: Self-pay | Admitting: Orthopedic Surgery

## 2017-05-19 ENCOUNTER — Ambulatory Visit (INDEPENDENT_AMBULATORY_CARE_PROVIDER_SITE_OTHER): Payer: Medicare Other | Admitting: Orthopedic Surgery

## 2017-05-19 VITALS — Ht 64.0 in | Wt 176.0 lb

## 2017-05-19 DIAGNOSIS — I70245 Atherosclerosis of native arteries of left leg with ulceration of other part of foot: Secondary | ICD-10-CM | POA: Diagnosis not present

## 2017-05-19 DIAGNOSIS — E1142 Type 2 diabetes mellitus with diabetic polyneuropathy: Secondary | ICD-10-CM | POA: Diagnosis not present

## 2017-05-19 MED ORDER — LIDOCAINE-PRILOCAINE 2.5-2.5 % EX CREA: TOPICAL_CREAM | CUTANEOUS | 3 refills | Status: DC

## 2017-05-19 MED ORDER — ONDANSETRON HCL 8 MG PO TABS: 8.0000 mg | ORAL_TABLET | Freq: Two times a day (BID) | ORAL | 1 refills | Status: DC | PRN

## 2017-05-19 MED ORDER — PROCHLORPERAZINE MALEATE 10 MG PO TABS: 10.0000 mg | ORAL_TABLET | Freq: Four times a day (QID) | ORAL | 1 refills | Status: DC | PRN

## 2017-05-19 NOTE — Patient Instructions (Signed)
[facility]   CHEMOTHERAPY INSTRUCTIONS  You have been diagnosed with Stage 4 non small cell lung cancer.  You are going to be treated with tecentriq.  This is given every 3 weeks.  It takes 30 minutes to infuse.  The first time it is given it will take 1 hour to infuse.  This treatment is with palliative intent, which means that you are treatable but not curable.   You will see the doctor regularly throughout treatment.  We monitor your lab work prior to every treatment.  The doctor monitors your response to treatment by the way you are feeling, your blood work, and scans periodically.  Expect wait times while you are here for treatment.  Labs can take 30 minutes to 1 hour to result.  And there is a wait time with pharmacy to allow them to mix your medications.     POTENTIAL SIDE EFFECTS OF TREATMENT:  Atezolizumab Gildardo Pounds)  About This Drug Huey Bienenstock is used to treat cancer. It is given by the vein (IV).  Possible Side Effects . Tiredness . Decreased appetite (decreased hunger) . Nausea . Constipation (not able to move bowels) . Loose bowel movements (diarrhea) . Urinary tract infection. Symptoms may include: . Pain or burning when you pass urine. . Feeling like you have to pass urine often, but not much comes out when you do. . Tender or heavy feeling in your lower abdomen. . Cloudy urine and/or urine that smells bad. . Pain on one side of your back under your ribs. This is where your kidneys are. . Fever, chills, nausea and/or throwing up. . Fever . Cough and/or trouble breathing . Muscle, bone and joint pain Note: Each of the side effects above was reported in 20% or greater of patients treated with atezolizumab. Not all possible side effects are included above.  Warnings and Precautions . This drug works with your immune system and can cause inflammation in any of your organs and tissues and can change how they work. This may put you  at risk for developing serious medical problems which can very rarely be fatal. . Inflammation (swelling) of the lungs which can very rarely be fatal. You may have a dry cough or trouble breathing. . Changes in your liver function. . Colitis. This is swelling (inflammation) in the colon - symptoms are loose bowel movements (diarrhea) stomach cramping, and sometimes blood in the bowel movements . Changes in your central nervous system can happen. The central nervous system is made up of your brain and spinal cord. You could feel extreme tiredness, agitation, confusion, hallucinations (see or hear things that are not there), trouble understanding or speaking, loss of control of your bowels or bladder, eyesight changes, numbness or lack of strength to your arms, legs, face, or body, and coma. If you start to have any of these symptoms let your doctor know right away. . This drug may affect some of your hormone glands (especially the thyroid, adrenals, pituitary and pancreas). . Blood sugar levels may change and you may develop diabetes. If you already have diabetes, changes may need to be made to your diabetes medication. . Inflammation of your pancreas. . Inflammation of your eyes and/or other changes in eyesight . Severe infections, including viral, bacterial and fungal, which can very rarely be fatal . While you are getting this drug in your vein (IV), you may have a reaction to the drug. Sometimes you may be given medication to stop or  lessen these side effects. Your nurse will check you closely for these signs: fever or shaking chills, flushing, facial swelling, feeling dizzy, headache, trouble breathing, rash, itching, chest tightness, or chest pain. These reactions may happen after your infusion. If this happens, call 911 for emergency care.  Important Information . This drug may be present in the saliva, tears, sweat, urine, stool, vomit, semen, and vaginal secretions. Talk to your  doctor and/or your nurse about the necessary precautions to take during this time.  Treating Side Effects . Drink plenty of fluids (a minimum of eight glasses per day is recommended). . To help with decreased appetite, eat small, frequent meals . Eat high caloric food such as pudding, ice cream, yogurt and milkshakes. . Ask your doctor or nurse about medicine that is available to help stop or lessen the loose bowel movements, nausea and/or constipation. . If you are not able to move your bowels, check with your doctor or nurse before you use enemas, laxatives, or suppositories . If you throw up or have loose bowel movements, you should drink more fluids so that you do not become dehydrated (lack water in the body from losing too much fluid). . To help with nausea and vomiting, eat small, frequent meals instead of three large meals a day. Choose foods and drinks that are at room temperature. Ask your nurse or doctor about other helpful tips and medicine that is available to help or stop lessen these symptoms. . If you get diarrhea, eat low-fiber foods that are high in protein and calories and avoid foods that can irritate your digestive tracts or lead to cramping. . Manage tiredness by pacing your activities for the day. Be sure to include periods of rest between energy-draining activities . If you're diabetic, keep good control of your blood sugar level. Tell your nurse or your doctor if your glucose levels are higher or lower than normal . Keeping your pain under control is important to your well-being. Please tell your doctor or nurse if you are experiencing pain. . Infusion reactions may happen for 24 hours after your infusion. If this happens, call 911 for emergency care.  Food and Drug Interactions . There are no known interactions of atezolizumab with food. . This drug may interact with other medicines. Tell your doctor and pharmacist about all the medicines and dietary  supplements (vitamins, minerals, herbs and others) that you are taking at this time. The safety and use of dietary supplements and alternative diets are often not known. Using these might affect your cancer or interfere with your treatment. Until more is known, you should not use dietary supplements or alternative diets without your cancer doctor's help.  When to Call the Doctor Call your doctor or nurse if you have any of these symptoms and/or any new or unusual symptoms: . Fever of 100.5 F (38 C) or higher . Chills . Pain in your chest . Dry cough . Trouble breathing . Confusion and/or agitation . Hallucinations . Trouble understanding or speaking . Blurry vision or changes in your eyesight . Numbness or lack of strength to your arms, legs, face, or body . Blurred vision or other changes in eyesight . Loose bowel movements (diarrhea) 4 times or loose bowel movements with lack of strength or a feeling of being dizzy . Pain in your abdomen that does not go away . Blood in your stool . No bowel movement in 3 days or when you feel uncomfortable . Nausea that stops you from  eating or drinking and/or is not relieved by prescribed medicines . Throwing up more than 3 times a day . Lasting loss of appetite or rapid weight loss of five pounds in a week . Pain or burning when you pass urine. . Difficulty urinating . Feeling like you have to pass urine often, but not much comes out when you do. . Tender or heavy feeling in your lower abdomen. . Cloudy urine and/or urine that smells bad. . Pain on one side of your back under your ribs. This is where your kidneys are. . Abnormal blood sugar . Unusual thirst, passing urine often, headache, sweating, shakiness, irritability . Pain that does not go away, or is not relieved by prescribed medicines . Fatigue that interferes with your daily activities . Signs of infusion reaction: fever or shaking chills, flushing, facial swelling, feeling dizzy,  headache, trouble breathing, rash, itching, chest tightness, or chest pain. . Signs of possible liver problems: dark urine, pale bowel movements, bad stomach pain, feeling very tired and weak, unusual itching, or yellowing of the eyes or skin . If you think you may be pregnant  Reproduction Warnings . Pregnancy warning: This drug can have harmful effects on the unborn baby. Women of child bearing potential should use effective methods of birth control during your cancer treatment and for at least 5 months after treatment. Let your doctor know right away if you think you may be pregnant. . Breastfeeding warning: It is not known if this drug passes into breast milk. For this reason, Women should not breast feed during treatment and for at least 5 months after treatment because this drug could enter the breast milk and cause harm to a breast feeding baby. . Fertility warning: In women, this drug may affect your ability to have children in the future. Talk with your doctor or nurse if you plan to have children. Ask for information on egg banking.    SELF CARE ACTIVITIES WHILE ON CHEMOTHERAPY: Hydration Increase your fluid intake 48 hours prior to treatment and drink at least 8 to 12 cups (64 ounces) of water/decaff beverages per day after treatment. You can still have your cup of coffee or soda but these beverages do not count as part of your 8 to 12 cups that you need to drink daily. No alcohol intake.  Medications Continue taking your normal prescription medication as prescribed.  If you start any new herbal or new supplements please let us know first to make sure it is safe.  Mouth Care Have teeth cleaned professionally before starting treatment. Keep dentures and partial plates clean. Use soft toothbrush and do not use mouthwashes that contain alcohol. Biotene is a good mouthwash that is available at most pharmacies or may be ordered by calling [phone]. Use warm salt water gargles (1  teaspoon salt per 1 quart warm water) before and after meals and at bedtime. Or you may rinse with 2 tablespoons of three-percent hydrogen peroxide mixed in eight ounces of water. If you are still having problems with your mouth or sores in your mouth please call the clinic. If you need dental work, please let the doctor know before you go for your appointment so that we can coordinate the best possible time for you in regards to your chemo regimen. You need to also let your dentist know that you are actively taking chemo. We may need to do labs prior to your dental appointment.   Skin Care Always use sunscreen that has not expired  and with SPF (Sun Protection Factor) of 50 or higher. Wear hats to protect your head from the sun. Remember to use sunscreen on your hands, ears, face, & feet.  Use good moisturizing lotions such as udder cream, eucerin, or even Vaseline. Some chemotherapies can cause dry skin, color changes in your skin and nails.    . Avoid long, hot showers or baths. . Use gentle, fragrance-free soaps and laundry detergent. . Use moisturizers, preferably creams or ointments rather than lotions because the thicker consistency is better at preventing skin dehydration. Apply the cream or ointment within 15 minutes of showering. Reapply moisturizer at night, and moisturize your hands every time after you wash them.  Hair Loss (if your doctor says your hair will fall out)  . If your doctor says that your hair is likely to fall out, decide before you begin chemo whether you want to wear a wig. You may want to shop before treatment to match your hair color. . Hats, turbans, and scarves can also camouflage hair loss, although some people prefer to leave their heads uncovered. If you go bare-headed outdoors, be sure to use sunscreen on your scalp. . Cut your hair short. It eases the inconvenience of shedding lots of hair, but it also can reduce the emotional impact of watching your hair fall  out. . Don't perm or color your hair during chemotherapy. Those chemical treatments are already damaging to hair and can enhance hair loss. Once your chemo treatments are done and your hair has grown back, it's OK to resume dyeing or perming hair. With chemotherapy, hair loss is almost always temporary. But when it grows back, it may be a different color or texture. In older adults who still had hair color before chemotherapy, the new growth may be completely gray.  Often, new hair is very fine and soft.  Infection Prevention Please wash your hands for at least 30 seconds using warm soapy water. Handwashing is the #1 way to prevent the spread of germs. Stay away from sick people or people who are getting over a cold. If you develop respiratory systems such as green/yellow mucus production or productive cough or persistent cough let us know and we will see if you need an antibiotic. It is a good idea to keep a pair of gloves on when going into grocery stores/[employer] to decrease your risk of coming into contact with germs on the carts, etc. Carry alcohol hand gel with you at all times and use it frequently if out in public. If your temperature reaches 100.5 or higher please call the clinic and let us know.  If it is after hours or on the weekend please go to the ER if your temperature is over 100.5.  Please have your own personal thermometer at home to use.    Sex and bodily fluids If you are going to have sex, a condom must be used to protect the person that isn't taking chemotherapy. Chemo can decrease your libido (sex drive). For a few days after chemotherapy, chemotherapy can be excreted through your bodily fluids.  When using the toilet please close the lid and flush the toilet twice.  Do this for a few day after you have had chemotherapy.     Effects of chemotherapy on your sex life Some changes are simple and won't last long. They won't affect your sex life permanently. Sometimes you may  feel: . too tired . not strong enough to be very active . sick or  sore  . not in the mood . anxious or low Your anxiety might not seem related to sex. For example, you may be worried about the cancer and how your treatment is going. Or you may be worried about money, or about how you family are coping with your illness. These things can cause stress, which can affect your interest in sex. It's important to talk to your partner about how you feel. Remember - the changes to your sex life don't usually last long. There's usually no medical reason to stop having sex during chemo. The drugs won't have any long term physical effects on your performance or enjoyment of sex. Cancer can't be passed on to your partner during sex  Contraception It's important to use reliable contraception during treatment. Avoid getting pregnant while you or your partner are having chemotherapy. This is because the drugs may harm the baby. Sometimes chemotherapy drugs can leave a man or woman infertile.  This means you would not be able to have children in the future. You might want to talk to someone about permanent infertility. It can be very difficult to learn that you may no longer be able to have children. Some people find counselling helpful. There might be ways to preserve your fertility, although this is easier for men than for women. You may want to speak to a fertility expert. You can talk about sperm banking or harvesting your eggs. You can also ask about other fertility options, such as donor eggs. If you have or have had breast cancer, your doctor might advise you not to take the contraceptive pill. This is because the hormones in it might affect the cancer.  It is not known for sure whether or not chemotherapy drugs can be passed on through semen or secretions from the vagina. Because of this some doctors advise people to use a barrier method if you have sex during treatment. This applies to vaginal, anal or oral  sex. Generally, doctors advise a barrier method only for the time you are actually having the treatment and for about a week after your treatment. Advice like this can be worrying, but this does not mean that you have to avoid being intimate with your partner. You can still have close contact with your partner and continue to enjoy sex.  Animals If you have cats or birds we just ask that you not change the litter or change the cage.  Please have someone else do this for you while you are on chemotherapy.   Food Safety During and After Cancer Treatment Food safety is important for people both during and after cancer treatment. Cancer and cancer treatments, such as chemotherapy, radiation therapy, and stem cell/bone marrow transplantation, often weaken the immune system. This makes it harder for your body to protect itself from foodborne illness, also called food poisoning. Foodborne illness is caused by eating food that contains harmful bacteria, parasites, or viruses.  Foods to avoid Some foods have a higher risk of becoming tainted with bacteria. These include: Marland Kitchen Unwashed fresh fruit and vegetables, especially leafy vegetables that can hide dirt and other contaminants . Raw sprouts, such as alfalfa sprouts . Raw or undercooked beef, especially ground beef, or other raw or undercooked meat and poultry . Fatty, fried, or spicy foods immediately before or after treatment.  These can sit heavy on your stomach and make you feel nauseous. . Raw or undercooked shellfish, such as oysters. . Sushi and sashimi, which often contain raw fish.  Marland Kitchen  Unpasteurized beverages, such as unpasteurized fruit juices, raw milk, raw yogurt, or cider . Undercooked eggs, such as soft boiled, over easy, and poached; raw, unpasteurized eggs; or foods made with raw egg, such as homemade raw cookie dough and homemade mayonnaise Simple steps for food safety Shop smart. . Do not buy food stored or displayed in an unclean  area. . Do not buy bruised or damaged fruits or vegetables. . Do not buy cans that have cracks, dents, or bulges. . Pick up foods that can spoil at the end of your shopping trip and store them in a cooler on the way home. Prepare and clean up foods carefully. . Rinse all fresh fruits and vegetables under running water, and dry them with a clean towel or paper towel. . Clean the top of cans before opening them. . After preparing food, wash your hands for 20 seconds with hot water and soap. Pay special attention to areas between fingers and under nails. . Clean your utensils and dishes with hot water and soap. Marland Kitchen Disinfect your kitchen and cutting boards using 1 teaspoon of liquid, unscented bleach mixed into 1 quart of water.   Dispose of old food. . Eat canned and packaged food before its expiration date (the "use by" or "best before" date). . Consume refrigerated leftovers within 3 to 4 days. After that time, throw out the food. Even if the food does not smell or look spoiled, it still may be unsafe. Some bacteria, such as Listeria, can grow even on foods stored in the refrigerator if they are kept for too long. Take precautions when eating out. . At restaurants, avoid buffets and salad bars where food sits out for a long time and comes in contact with many people. Food can become contaminated when someone with a virus, often a norovirus, or another "bug" handles it. . Put any leftover food in a "to-go" container yourself, rather than having the server do it. And, refrigerate leftovers as soon as you get home. . Choose restaurants that are clean and that are willing to prepare your food as you order it cooked.    MEDICATIONS:                                                                                                                                                              Zofran/Ondansetron 8mg  tablet. Take 1 tablet every 8 hours as needed for nausea/vomiting. (#1 nausea med to take,  this can constipate)  Compazine/Prochlorperazine 10mg  tablet. Take 1 tablet every 6 hours as needed for nausea/vomiting. (#2 nausea med to take, this can make you sleepy)   EMLA cream. Apply a quarter size amount to port site 1 hour prior to chemo. Do not rub in. Cover with plastic wrap.   Over-the-Counter Meds:  Miralax 1 capful in 8 oz of fluid daily. May increase to two times a day if needed. This is a stool softener. If this doesn't work proceed you can add:  Senokot S-start with 1 tablet two times a day and increase to 4 tablets two times a day if needed. (total of 8 tablets in a 24 hour period). This is a stimulant laxative.   Call us if this does not help your bowels move.   Imodium 2mg  capsule. Take 2 capsules after the 1st loose stool and then 1 capsule every 2 hours until you go a total of 12 hours without having a loose stool. Call the [facility] if loose stools continue. If diarrhea occurs @ bedtime, take 2 capsules @ bedtime. Then take 2 capsules every 4 hours until morning. Call [facility].     Diarrhea Sheet  If you are having loose stools/diarrhea, please purchase Imodium and begin taking as outlined:  At the first sign of poorly formed or loose stools you should begin taking Imodium(loperamide) 2 mg capsules.  Take two caplets (4mg ) followed by one caplet (2mg ) every 2 hours until you have had no diarrhea for 12 hours.  During the night take two caplets (4mg ) at bedtime and continue every 4 hours during the night until the morning.  Stop taking Imodium only after there is no sign of diarrhea for 12 hours.    Always call the [facility] if you are having loose stools/diarrhea that you can't get under control.  Loose stools/disrrhea leads to dehydration (loss of water) in your body.  We have other options of trying to get the loose stools/diarrhea to stopped but you must let us know!     Constipation Sheet *Miralax in 8 oz of fluid daily.  May increase to two  times a day if needed.  This is a stool softener.  If this not enough to keep your bowel regular:  You can add:  *Senokot S, start with one tablet twice a day and can increase to 4 tablets twice a day if needed.  This is a stimulant laxative.   Sometimes when you take pain medication you need BOTH a medicine to keep your stool soft and a medicine to help your bowel push it out!  Please call if the above does not work for you.   Do not go more than 2 days without a bowel movement.  It is very important that you do not become constipated.  It will make you feel sick to your stomach (nausea) and can cause abdominal pain and vomiting.     Nausea Sheet  Zofran/Ondansetron 8mg  tablet. Take 1 tablet every 8 hours as needed for nausea/vomiting. (#1 nausea med to take, this can constipate)  Compazine/Prochlorperazine 10mg  tablet. Take 1 tablet every 6 hours as needed for nausea/vomiting. (#2 nausea med to take, this can make you sleepy)  You can take these medications together or separately.  We would first like for you to try the Ondansetron by itself and then take the Prochloperizine if needed. But you are allowed to take both medications at the same time if your nausea is that severe.  If you are having persistent nausea (nausea that does not stop) please take these medications on a staggered schedule so that the nausea medication stays in your body.  Please call the [facility] and let us know the amount of nausea that you are experiencing.  If you begin to vomit, you need to call the  [facility] and if it is the weekend and you have vomited more than one time and cant get it to stop-go to the Emergency Room.  Persistent nausea/vomiting can lead to dehydration (loss of fluid in your body) and will make you feel terrible.   Ice chips, sips of clear liquids, foods that are @ room temperature, crackers, and toast tend to be better tolerated.     SYMPTOMS TO REPORT AS SOON AS POSSIBLE AFTER  TREATMENT:  FEVER GREATER THAN 100.5 F  CHILLS WITH OR WITHOUT FEVER  NAUSEA AND VOMITING THAT IS NOT CONTROLLED WITH YOUR NAUSEA MEDICATION  UNUSUAL SHORTNESS OF BREATH  UNUSUAL BRUISING OR BLEEDING  TENDERNESS IN MOUTH AND THROAT WITH OR WITHOUT PRESENCE OF ULCERS  URINARY PROBLEMS  BOWEL PROBLEMS  UNUSUAL RASH    Wear comfortable clothing and clothing appropriate for easy access to any Portacath or PICC line. Let us know if there is anything that we can do to make your therapy better!    What to do if you need assistance after hours or on the weekends: CALL [phone].  HOLD on the line, do not hang up.  You will hear multiple messages but at the end you will be connected with a nurse triage line.  They will contact the doctor if necessary.  Most of the time they will be able to assist you.  Do not call the hospital operator.     I have been informed and understand all of the instructions given to me and have received a copy. I have been instructed to call the clinic [phone] or my family physician as soon as possible for continued medical care, if indicated. I do not have any more questions at this time but understand that I may call the [facility] or the Patient Navigator at [phone] during office hours should I have questions or need assistance in obtaining follow-up care.

## 2017-05-19 NOTE — Progress Notes (Signed)
Office Visit Note   Patient: Angela Lawson           Date of Birth: 1934-11-25           MRN: [id number] Visit Date: 05/19/2017              Requested by: Rosita Fire, MD [address] [place],  [address] PCP: Rosita Fire, MD  Chief Complaint  Patient presents with  . Left Foot - Nail Problem    03/11/17 GT nail removal      HPI: Patient is an 83 year old woman who was seen for initial evaluation referral from the emergency room and patient is status post nail excision on the left great toenail she has a nonhealing ulcer she is currently on doxycycline and Bactrim dressing changes.  Patient is a type II diabetic she is currently on 2 L nasal cannula FiO2 she has a history of recurrent lung cancer.  Assessment & Plan: Visit Diagnoses:  1. Atherosclerosis of native artery of left lower extremity with ulceration of other part of foot ([place])   2. Diabetic polyneuropathy associated with type 2 diabetes mellitus ([place])     Plan: We'll have her follow-up with her cardiologist for ankle-brachial indices studies patient may require revascularization to left lower extremity she does have chronic osteomyelitis of the tuft of the great toenail and would not heal a surgical incision at this time due to her peripheral vascular disease. She'll continue the Bactroban continue with doxycycline.  Follow-Up Instructions: Return in about 4 weeks (around 06/16/2017).   Ortho Exam  Patient is alert, oriented, no adenopathy, well-dressed, normal affect, normal respiratory effort. Examination patient's feet are cold and atrophic. She has ischemic ulceration to the left great toenail. She has no purulence no drainage no ascending cellulitis. She does not have a palpable dorsalis pedis pulse she does not have protective sensation. A Doppler was used and patient does have a monophasic dorsalis pedis pulse. Her radiographs were reviewed which shows destructive bony changes of the tuft of the  great toe secondary to chronic osteomyelitis.  Imaging: No results found.  Labs: Lab Results  Component Value Date   HGBA1C 7.0 (H) 04/21/2017   HGBA1C 6.9 (H) 07/09/2015   HGBA1C 7.4 (H) 04/17/2015   ESRSEDRATE 28 (H) 05/15/2017   CRP 2.1 (H) 05/15/2017   REPTSTATUS 04/21/2012 FINAL 04/19/2012   CULT NO GROWTH 04/19/2012   LABORGA No Herpes Simplex Virus detected. 11/24/2014    Orders:  No orders of the defined types were placed in this encounter.  No orders of the defined types were placed in this encounter.    Procedures: No procedures performed  Clinical Data: No additional findings.  ROS:  All other systems negative, except as noted in the HPI. Review of Systems  Objective: Vital Signs: Ht 5\' 4"  (1.626 m)   Wt 176 lb (79.8 kg)   BMI 30.21 kg/m   Specialty Comments:  No specialty comments available.  PMFS History: Patient Active Problem List   Diagnosis Date Noted  . Counseling regarding advanced care planning and goals of care 05/15/2017  . Acute renal injury ([place]) 04/04/2017  . Chest pain 04/04/2017  . Nausea and vomiting 04/04/2017  . COPD with acute exacerbation ([place]) 04/25/2016  . Acute on chronic respiratory failure with hypoxia ([place]) 04/25/2016  . SOB (shortness of breath)   . Osteoporosis 11/04/2015  . CHF (congestive heart failure) ([place]) 08/01/2015  . CHF exacerbation ([place]) 07/09/2015  . Essential hypertension 04/19/2015  .  Type II diabetes mellitus ([place]) 04/19/2015  . Dyslipidemia 04/19/2015  . History of lung cancer 04/19/2015  . Takotsubo cardiomyopathy 04/19/2015  . Ventricular bigeminy 04/19/2015  . NSTEMI (non-ST elevated myocardial infarction) ([place]) 04/19/2015  . Stress-induced cardiomyopathy 04/18/2015  . Elevated troponin 04/16/2015  . COPD exacerbation ([place]) 04/16/2015  . CKD (chronic kidney disease) stage 3, GFR 30-59 ml/min 04/16/2015  . Herpes genitalis in women 11/24/2014  . Toe fracture 09/28/2014  . Non-traumatic tear  of right rotator cuff 11/30/2013  . Pain in joint, shoulder region 11/30/2013  . Muscle weakness (generalized) 11/30/2013  . Bursitis of left shoulder 02/09/2013  . Adenocarcinoma of lung ([place]) 08/20/2012   Past Medical History:  Diagnosis Date  . Adenocarcinoma of lung ([place])    Left lung 2009, resected  . Anginal pain ([place])   . Arthritis   . Back pain   . CHF (congestive heart failure) ([place])   . COPD (chronic obstructive pulmonary disease) ([place])   . Diverticulitis   . Dyslipidemia   . Essential hypertension   . H/O ventral hernia   . Non-obstructive CAD    a. 04/2015 NSTEMI/Cath: LAD 10p, LCX 70m, RCA 78m, 20d, EF 35-40 w/ apical ballooning.  . On home O2    3L N/C  . Osteoporosis   . Osteoporosis 11/04/2015   Managed by Dr. Legrand Rams   . Parkinson's disease ([place])   . Shortness of breath   . Takotsubo cardiomyopathy    a. 04/2015 Echo: EF 45-50%, mid-dist anterior/apical/inferoapical HK w/ hyperdynamic base. Gr 1 DD, mild AI, mild-mod MR, triv TR, PASP 26mmHg;  b. 04/2015 LV gram: Ef 35-40% w/ apical ballooning.  . Type II diabetes mellitus ([place])   . Ventricular bigeminy    a. 04/2015 in setting of NSTEMI/Takotsubo.    Family History  Problem Relation Age of Onset  . Diabetes Mother   . Hypertension Mother   . Diabetes Father   . Asthma Unknown   . Cancer Unknown   . Heart attack Sister        X2  . Colon cancer Neg Hx     Past Surgical History:  Procedure Laterality Date  . ABDOMINAL HYSTERECTOMY    . CARDIAC CATHETERIZATION N/A 04/17/2015   Procedure: Left Heart Cath and Coronary Angiography;  Surgeon: Troy Sine, MD;  Location: [facility];  Service: Cardiovascular;  Laterality: N/A;  . CHOLECYSTECTOMY    . COLONOSCOPY N/A 09/18/2014   Procedure: COLONOSCOPY;  Surgeon: Danie Binder, MD;  [facility];  Service: Endoscopy;  Laterality: N/A;  8:30 AM - moved to 10:30 Rosendo Gros to notify pt  . ECTOPIC PREGNANCY SURGERY    . INCISIONAL HERNIA  REPAIR N/A 08/26/2013   Procedure: HERNIA REPAIR INCISIONAL WITH MESH;  Surgeon: Jamesetta So, MD;  Location: [facility];  Service: General;  Laterality: N/A;  . IR FLUORO GUIDE PORT INSERTION RIGHT  04/30/2017  . IR US GUIDE VASC ACCESS RIGHT  04/30/2017  . LUNG CANCER SURGERY    . VIDEO BRONCHOSCOPY WITH ENDOBRONCHIAL NAVIGATION N/A 04/23/2017   Procedure: VIDEO BRONCHOSCOPY WITH ENDOBRONCHIAL NAVIGATION;  Surgeon: Melrose Nakayama, MD;  Location: [facility];  Service: Thoracic;  Laterality: N/A;  . VIDEO BRONCHOSCOPY WITH ENDOBRONCHIAL ULTRASOUND N/A 04/23/2017   Procedure: VIDEO BRONCHOSCOPY WITH ENDOBRONCHIAL ULTRASOUND;  Surgeon: Melrose Nakayama, MD;  Location: [facility];  Service: Thoracic;  Laterality: N/A;   Social History   Occupational History  . Not on file.  Social History Main Topics  . Smoking status: Former Smoker    Years: 20.00    Types: Cigarettes    Quit date: 08/13/2006  . Smokeless tobacco: Never Used  . Alcohol use No  . Drug use: No  . Sexual activity: Not Currently    Birth control/ protection: Surgical

## 2017-05-20 ENCOUNTER — Telehealth (INDEPENDENT_AMBULATORY_CARE_PROVIDER_SITE_OTHER): Payer: Self-pay | Admitting: Orthopedic Surgery

## 2017-05-20 DIAGNOSIS — E1142 Type 2 diabetes mellitus with diabetic polyneuropathy: Secondary | ICD-10-CM

## 2017-05-20 DIAGNOSIS — M86671 Other chronic osteomyelitis, right ankle and foot: Secondary | ICD-10-CM

## 2017-05-20 DIAGNOSIS — I70235 Atherosclerosis of native arteries of right leg with ulceration of other part of foot: Secondary | ICD-10-CM

## 2017-05-20 NOTE — Telephone Encounter (Signed)
Patient's daughter Mickel Baas) called advised Dr Sharol Given will need to refer [facility]. She advised the referral should be faxed to [phone]. The ph# is [phone]. The number to contact Mickel Baas is [phone]

## 2017-05-20 NOTE — Telephone Encounter (Signed)
This was placed in EPIC to cardiologist. And was faxed as well.

## 2017-05-22 LAB — FUNGUS CULTURE WITH STAIN

## 2017-05-22 LAB — FUNGUS CULTURE RESULT

## 2017-05-22 LAB — FUNGAL ORGANISM REFLEX

## 2017-05-25 DIAGNOSIS — J449 Chronic obstructive pulmonary disease, unspecified: Secondary | ICD-10-CM | POA: Diagnosis not present

## 2017-05-28 ENCOUNTER — Encounter (HOSPITAL_BASED_OUTPATIENT_CLINIC_OR_DEPARTMENT_OTHER): Payer: Medicare Other

## 2017-05-28 ENCOUNTER — Encounter (HOSPITAL_COMMUNITY): Payer: Medicare Other

## 2017-05-28 ENCOUNTER — Encounter (HOSPITAL_COMMUNITY): Payer: Self-pay

## 2017-05-28 VITALS — BP 111/35 | HR 73 | Temp 97.8°F | Resp 16 | Wt 176.0 lb

## 2017-05-28 DIAGNOSIS — Z7189 Other specified counseling: Secondary | ICD-10-CM

## 2017-05-28 DIAGNOSIS — C3412 Malignant neoplasm of upper lobe, left bronchus or lung: Secondary | ICD-10-CM

## 2017-05-28 DIAGNOSIS — C3490 Malignant neoplasm of unspecified part of unspecified bronchus or lung: Secondary | ICD-10-CM

## 2017-05-28 DIAGNOSIS — Z5112 Encounter for antineoplastic immunotherapy: Secondary | ICD-10-CM | POA: Diagnosis not present

## 2017-05-28 DIAGNOSIS — C3492 Malignant neoplasm of unspecified part of left bronchus or lung: Secondary | ICD-10-CM | POA: Diagnosis not present

## 2017-05-28 DIAGNOSIS — C3431 Malignant neoplasm of lower lobe, right bronchus or lung: Secondary | ICD-10-CM | POA: Diagnosis not present

## 2017-05-28 LAB — COMPREHENSIVE METABOLIC PANEL
ALT: 14 U/L (ref 14–54)
AST: 22 U/L (ref 15–41)
Albumin: 3.3 g/dL — ABNORMAL LOW (ref 3.5–5.0)
Alkaline Phosphatase: 50 U/L (ref 38–126)
Anion gap: 8 (ref 5–15)
BUN: 31 mg/dL — ABNORMAL HIGH (ref 6–20)
CO2: 27 mmol/L (ref 22–32)
Calcium: 9 mg/dL (ref 8.9–10.3)
Chloride: 103 mmol/L (ref 101–111)
Creatinine, Ser: 1.69 mg/dL — ABNORMAL HIGH (ref 0.44–1.00)
GFR calc Af Amer: 32 mL/min — ABNORMAL LOW (ref >60)
GFR calc non Af Amer: 27 mL/min — ABNORMAL LOW (ref >60)
Glucose, Bld: 167 mg/dL — ABNORMAL HIGH (ref 65–99)
Potassium: 4.2 mmol/L (ref 3.5–5.1)
Sodium: 138 mmol/L (ref 135–145)
Total Bilirubin: 0.3 mg/dL (ref 0.3–1.2)
Total Protein: 6.2 g/dL — ABNORMAL LOW (ref 6.5–8.1)

## 2017-05-28 LAB — CBC WITH DIFFERENTIAL/PLATELET
Basophils Absolute: 0 10*3/uL (ref 0.0–0.1)
Basophils Relative: 0 %
Eosinophils Absolute: 0.2 10*3/uL (ref 0.0–0.7)
Eosinophils Relative: 3 %
HCT: 34.4 % — ABNORMAL LOW (ref 36.0–46.0)
Hemoglobin: 10.7 g/dL — ABNORMAL LOW (ref 12.0–15.0)
Lymphocytes Relative: 23 %
Lymphs Abs: 1.3 10*3/uL (ref 0.7–4.0)
MCH: 29.9 pg (ref 26.0–34.0)
MCHC: 31.1 g/dL (ref 30.0–36.0)
MCV: 96.1 fL (ref 78.0–100.0)
Monocytes Absolute: 0.6 10*3/uL (ref 0.1–1.0)
Monocytes Relative: 11 %
Neutro Abs: 3.6 10*3/uL (ref 1.7–7.7)
Neutrophils Relative %: 63 %
Platelets: 207 10*3/uL (ref 150–400)
RBC: 3.58 MIL/uL — ABNORMAL LOW (ref 3.87–5.11)
RDW: 14.9 % (ref 11.5–15.5)
WBC: 5.6 10*3/uL (ref 4.0–10.5)

## 2017-05-28 LAB — TSH: TSH: 1.248 u[IU]/mL (ref 0.350–4.500)

## 2017-05-28 MED ORDER — SODIUM CHLORIDE 0.9 % IV SOLN
Freq: Once | INTRAVENOUS | Status: AC
  Administered 2017-05-28: 12:00:00 via INTRAVENOUS

## 2017-05-28 MED ORDER — SODIUM CHLORIDE 0.9% FLUSH
10.0000 mL | INTRAVENOUS | Status: DC | PRN
  Administered 2017-05-28: 10 mL
  Filled 2017-05-28: qty 10

## 2017-05-28 MED ORDER — SODIUM CHLORIDE 0.9 % IV SOLN
1200.0000 mg | Freq: Once | INTRAVENOUS | Status: AC
  Administered 2017-05-28: 1200 mg via INTRAVENOUS
  Filled 2017-05-28: qty 20

## 2017-05-28 MED ORDER — HEPARIN SOD (PORK) LOCK FLUSH 100 UNIT/ML IV SOLN
500.0000 [IU] | Freq: Once | INTRAVENOUS | Status: AC | PRN
  Administered 2017-05-28: 500 [IU]
  Filled 2017-05-28: qty 5

## 2017-05-28 NOTE — Progress Notes (Signed)
1120 Labs reviewed with Dr. Talbert Cage and pt approved for Tecentriq infusion today

## 2017-05-28 NOTE — Patient Instructions (Signed)
[facility] Discharge Instructions for Patients Receiving Chemotherapy   Beginning January 23rd 2017 lab work for the [facility] will be done in the  Main lab at [facility] on 1st floor. If you have a lab appointment with the [facility] please come in thru the  Main Entrance and check in at the main information desk   Today you received the following chemotherapy agents Tecentriq. Follow-up as scheduled. Call clinic for any questions or concerns  To help prevent nausea and vomiting after your treatment, we encourage you to take your nausea medication   If you develop nausea and vomiting, or diarrhea that is not controlled by your medication, call the clinic.  The clinic phone number is [phone]. Office hours are Monday-Friday 8:30am-5:00pm.  BELOW ARE SYMPTOMS THAT SHOULD BE REPORTED IMMEDIATELY:  *FEVER GREATER THAN 101.0 F  *CHILLS WITH OR WITHOUT FEVER  NAUSEA AND VOMITING THAT IS NOT CONTROLLED WITH YOUR NAUSEA MEDICATION  *UNUSUAL SHORTNESS OF BREATH  *UNUSUAL BRUISING OR BLEEDING  TENDERNESS IN MOUTH AND THROAT WITH OR WITHOUT PRESENCE OF ULCERS  *URINARY PROBLEMS  *BOWEL PROBLEMS  UNUSUAL RASH Items with * indicate a potential emergency and should be followed up as soon as possible. If you have an emergency after office hours please contact your primary care physician or go to the nearest emergency department.  Please call the clinic during office hours if you have any questions or concerns.   You may also contact the Patient Navigator at [phone] should you have any questions or need assistance in obtaining follow up care.      Resources For Cancer Patients and their Caregivers ? American Cancer Society: Can assist with transportation, wigs, general needs, runs Look Good Feel Better.        (612) 709-5743 ? Cancer Care: Provides financial assistance, online support groups, medication/co-pay assistance.  1-800-813-HOPE  (561)404-0556) ? [place] Assists [place] cancer patients and their families through emotional , educational and financial support.  902-879-4675 ? [place] DSS Where to apply for food stamps, Medicaid and utility assistance. (901)767-8670 ? [facility]: Transportation to medical appointments. 914 817 8076 ? Social Security Administration: May apply for disability if have a Stage IV cancer. 978-014-6725 918 760 8571 ? [employer], Disability and Transit Services: Assists with nutrition, care and transit needs. 727-104-7732

## 2017-05-28 NOTE — Progress Notes (Signed)
Angela Lawson tolerated Tecentriq infusion well without complaints or incident.Chemo teaching done by nurse navigator prior to administration of Tecentriq. VSS upon discharge. Pt discharged via wheelchair in satisfactory condition accompanied by her daughter

## 2017-05-28 NOTE — Progress Notes (Signed)
Chemotherapy teaching completed.  Consent signed.  Extensive teaching packet given.   

## 2017-05-29 ENCOUNTER — Telehealth (HOSPITAL_COMMUNITY): Payer: Self-pay | Admitting: *Deleted

## 2017-05-29 NOTE — Telephone Encounter (Signed)
24 hour callback:  Patient contacted and she states that she feels great. Patient denies any nausea/vomiting, constipation or diarrhea. Patient questioned what she is supposed to do if she does get diarrhea.  Advised patient of immodium dosing and she verbalizes understanding. Patient denies needing anything at this time.

## 2017-06-01 MED ORDER — POLYETHYLENE GLYCOL 3350 17 GM/SCOOP PO POWD: ORAL | 2 refills | Status: DC

## 2017-06-01 NOTE — Addendum Note (Signed)
Addended by: Elenor Legato on: 06/01/2017 01:05 PM   Modules accepted: Orders

## 2017-06-03 DIAGNOSIS — L110 Acquired keratosis follicularis: Secondary | ICD-10-CM | POA: Diagnosis not present

## 2017-06-03 DIAGNOSIS — E1151 Type 2 diabetes mellitus with diabetic peripheral angiopathy without gangrene: Secondary | ICD-10-CM | POA: Diagnosis not present

## 2017-06-03 DIAGNOSIS — B351 Tinea unguium: Secondary | ICD-10-CM | POA: Diagnosis not present

## 2017-06-03 DIAGNOSIS — E1140 Type 2 diabetes mellitus with diabetic neuropathy, unspecified: Secondary | ICD-10-CM | POA: Diagnosis not present

## 2017-06-03 NOTE — Progress Notes (Signed)
Angela Lawson  HEMATOLOGY ONCOLOGY PROGRESS NOTE  Date of service: .05/15/2017  Patient Care Team: Rosita Fire, MD as PCP - General (Internal Medicine)  REASON FOR VISIT:  New/recurrent adenocarcinoma of the left and right lung; h/o Stage IA NSCLC s/p resection in 2009.   CURRENT THERAPY: planning to start Atezolizumab  SUMMARY OF ONCOLOGIC HISTORY: Oncology History   stage I A., non-small cell lung cancer, consistent with a poorly differentiated adenocarcinoma, status post resection 07/07/2008 in [place].       Adenocarcinoma of lung ([place])   07/07/2008 Surgery    Performed in [place], [place].  Consistent with poorly differentiated adenocarcinoma.      02/27/2014 Imaging    CT of chest demonstrating a RLL solid nodular component measuring 9 mm at the site of previous ground-glass opacity. Slight increase in size of an anterior mediastinal node measuring 1.1 cm with stable 1 cm pretracheal lymph node. Consider PET      03/20/2014 Imaging    PET scan- No hypermetabolic pulmonary lesions to suggest recurrent or metastatic pulmonary disease.  Weakly positive left internal mammary lymph node and left axillary lymph node (? inflammatory process involving the left breast).      11/07/2015 Imaging    CT chest- 1. No evidence of metastatic involvement of the lungs. The previously noted small noncalcified lung nodules are stable. 2. Vague focal opacity in the right lower lobe may represent scarring and is stable. Continued followup however is recommended in 1 year.      01/08/2017 Imaging    CT chest- 1. New nodules along a thick walled bullous lesion in the right lower lobe, suspicious for malignancy. 2. New fairly large region of confluent ground-glass opacity posteriorly in the remaining left lung, low grade adenocarcinoma is not excluded and there is mild new nodularity along the upper margin of this process. 3. Stable biapical pleuroparenchymal scarring with several  stable nodules in the left lung. 4. Borderline adenopathy in the mediastinum, essentially stable from prior. 5. Coronary, aortic arch, and branch vessel atherosclerotic vascular disease. 6. Small hiatal hernia. 7. Hypodense left thyroid lesion. If not previously worked up, thyroid ultrasound could be employed.      01/21/2017 PET scan    1. New hypermetabolic adenopathy in the mediastinum and hila, with progressive hypermetabolic ground-glass density posteriorly in the left lower lobe with faint nodular components, and some low-grade hypermetabolic activity in ground-glass opacity dependently in the right upper lobe. Although the appearance is concerning for malignancy potentially with a lymphangitic component in the left lower lobe, the unusual configuration in the appearance of the ground-glass opacities could conceivably represent an atypical granulomatous infectious process as an alternative. This is not classic obvious recurrence with well-defined mass or nodules. 2. There is some ill-defined clustered nodularity in the right lower lobe only faintly metabolic, maximum SUV 3.0. 3. Overall I believe that this process require surveillance to differentiate an inflammatory/infectious condition from recurrent malignancy. The adenopathy is certainly concerning. 4. No findings of malignancy in the neck, abdomen/pelvis, or skeleton. 5. Sigmoid colon diverticulosis. 6. Emphysema. 7. Chronic ethmoid sinusitis.      03/25/2017 Imaging    CT chest- Stable 5.9 cm left lower lobe ground-glass opacity, hypermetabolic on prior PET, worrisome for primary bronchogenic neoplasm such as low-grade adenocarcinoma.  Stable 2.2 cm cavitary lesion in the central right lower lobe with surrounding hypermetabolic nodularity measuring up to 1.8 cm, worrisome for primary bronchogenic neoplasm such as squamous cell carcinoma.  Hypermetabolic mediastinal lymph nodes measuring  up to 11 mm short axis,  as above. Bilateral hilar regions are hypermetabolic on PET but poorly evaluated on unenhanced CT.  Emphysema and aortic atherosclerosis.      04/23/2017 Procedure    Bronchoscopy and EBUS with mediastinal lymph node aspirations by Dr. Roxan Hockey.      04/26/2017 Pathology Results    1. Lung, biopsy, Left upper lobe - ADENOCARCINOMA. - SEE COMMENT. 2. Lung, biopsy, Right lower lobe - ADENOCARCINOMA. - SEE COMMENT. 3. Lung, biopsy, Right lower lobe target 2 - ATYPICAL CELLS. - SEE COMMENT.      04/26/2017 Relapse/Recurrence         05/14/2017 Pathology Results    PDL1 NEGATIVE.  TPS 0%.      05/19/2017 Pathology Results    FoundationOne testing- MS-stable.  TMB 6 Muts/Mb (Intermediate).  STK11 E293*.  Negative for EGFR, KRAS, ALK, BRAF, MET, RET, ERBB2, and ROS1.  Potential treatment options: Atezolizumab/Durvalumab/Nivolumab/Pembrolizumab for TMB score.       INTERVAL HISTORY:  [name] is here for follow-up of her metastatic lung adenocarcinoma with bilateral pulmonary lesions. She is here to discuss further treatment options. Given her poor performance status she was deemed not to be a good candidate for platinum doublet chemotherapy previously been evaluated by Dr. Talbert Cage. Molecular studies shows PDL 1 negative. Foundation one study shows no overt targetable mutation. Intermediate tumor mutation burden. MSI stable. We discussed all the results in details in the presence of several family members including one of her family members was a Software engineer. We discussed that given her functional status aggressive chemotherapy would probably not be a good option. Patient notes she is open to the idea of immunotherapy and after discussing is agreeable to proceed with Atezolizumab.   She notes the treatment is only palliative. We also discussed the option of best supportive care's.    REVIEW OF SYSTEMS:    10 Point review of systems of done and is negative except as noted  above.  . Past Medical History:  Diagnosis Date  . Adenocarcinoma of lung ([place])    Left lung 2009, resected  . Anginal pain ([place])   . Arthritis   . Back pain   . CHF (congestive heart failure) ([place])   . COPD (chronic obstructive pulmonary disease) ([place])   . Diverticulitis   . Dyslipidemia   . Essential hypertension   . H/O ventral hernia   . Non-obstructive CAD    a. 04/2015 NSTEMI/Cath: LAD 10p, LCX 75m RCA 358m20d, EF 35-40 w/ apical ballooning.  . On home O2    3L N/C  . Osteoporosis   . Osteoporosis 11/04/2015   Managed by Dr. FaLegrand Rams . Parkinson's disease ([facility] [place]  . Shortness of breath   . Takotsubo cardiomyopathy    a. 04/2015 Echo: EF 45-50%, mid-dist anterior/apical/inferoapical HK w/ hyperdynamic base. Gr 1 DD, mild AI, mild-mod MR, triv TR, PASP 4862m;  b. 04/2015 LV gram: Ef 35-40% w/ apical ballooning.  . Type II diabetes mellitus ([place] . Ventricular bigeminy    a. 04/2015 in setting of NSTEMI/Takotsubo.    . Past Surgical History:  Procedure Laterality Date  . ABDOMINAL HYSTERECTOMY    . CARDIAC CATHETERIZATION N/A 04/17/2015   Procedure: Left Heart Cath and Coronary Angiography;  Surgeon: ThoTroy SineD;  Location: [facility];  Service: Cardiovascular;  Laterality: N/A;  . CHOLECYSTECTOMY    . COLONOSCOPY N/A 09/18/2014   Procedure: COLONOSCOPY;  Surgeon: SanDanie Binder  MD;  Location: [facility];  Service: Endoscopy;  Laterality: N/A;  8:30 AM - moved to 10:30 Rosendo Gros to notify pt  . ECTOPIC PREGNANCY SURGERY    . INCISIONAL HERNIA REPAIR N/A 08/26/2013   Procedure: HERNIA REPAIR INCISIONAL WITH MESH;  Surgeon: Jamesetta So, MD;  Location: [facility];  Service: General;  Laterality: N/A;  . IR FLUORO GUIDE PORT INSERTION RIGHT  04/30/2017  . IR US GUIDE VASC ACCESS RIGHT  04/30/2017  . LUNG CANCER SURGERY    . VIDEO BRONCHOSCOPY WITH ENDOBRONCHIAL NAVIGATION N/A 04/23/2017   Procedure: VIDEO BRONCHOSCOPY WITH ENDOBRONCHIAL NAVIGATION;  Surgeon:  Melrose Nakayama, MD;  Location: [facility];  Service: Thoracic;  Laterality: N/A;  . VIDEO BRONCHOSCOPY WITH ENDOBRONCHIAL ULTRASOUND N/A 04/23/2017   Procedure: VIDEO BRONCHOSCOPY WITH ENDOBRONCHIAL ULTRASOUND;  Surgeon: Melrose Nakayama, MD;  Location: [facility] OR;  Service: Thoracic;  Laterality: N/A;    . Social History  Substance Use Topics  . Smoking status: Former Smoker    Years: 20.00    Types: Cigarettes    Quit date: 08/13/2006  . Smokeless tobacco: Never Used  . Alcohol use No    ALLERGIES:  is allergic to lisinopril.  MEDICATIONS:  Current Outpatient Prescriptions  Medication Sig Dispense Refill  . albuterol (PROAIR HFA) 108 (90 BASE) MCG/ACT inhaler Inhale 2 puffs into the lungs every 4 (four) hours as needed for wheezing or shortness of breath.    Angela Lawson alendronate (FOSAMAX) 70 MG tablet Take 70 mg by mouth every Friday.     Angela Lawson aspirin EC 81 MG tablet Take 81 mg by mouth daily.    Angela Lawson atorvastatin (LIPITOR) 40 MG tablet Take 40 mg by mouth at bedtime.     Angela Lawson buPROPion (WELLBUTRIN SR) 150 MG 12 hr tablet Take 150 mg by mouth 2 (two) times daily.    . cholecalciferol (VITAMIN D) 1000 units tablet Take 1,000 Units by mouth daily.    . furosemide (LASIX) 40 MG tablet Take 40 mg by mouth daily. Pt is able to take one additional tablet daily for weight increases of 3lbs in a day or 5lbs in a week.    . gabapentin (NEURONTIN) 300 MG capsule Take 600 mg by mouth at bedtime.     . Garlic 811 MG TABS Take 300 mg by mouth daily.    Angela Lawson ipratropium-albuterol (DUONEB) 0.5-2.5 (3) MG/3ML SOLN Inhale 3 mLs into the lungs every 6 (six) hours as needed (for wheezing/shortness of breath).     Angela Lawson LANTUS SOLOSTAR 100 UNIT/ML Solostar Pen Inject 15 Units into the skin at bedtime.     Angela Lawson linagliptin (TRADJENTA) 5 MG TABS tablet Take 5 mg by mouth daily.    Angela Lawson loratadine (CLARITIN) 10 MG tablet Take 10 mg by mouth daily.  3  . losartan (COZAAR) 50 MG tablet Take 50 mg by mouth daily.  2  . meclizine  (ANTIVERT) 25 MG tablet Take 25 mg by mouth daily.     . metoprolol succinate (TOPROL-XL) 25 MG 24 hr tablet Take 1 tablet (25 mg total) by mouth daily. 90 tablet 3  . nitroGLYCERIN (NITROSTAT) 0.4 MG SL tablet Place 0.4 mg under the tongue every 5 (five) minutes as needed for chest pain.    Angela Lawson omeprazole (PRILOSEC) 40 MG capsule Take 40 mg by mouth daily.    . potassium chloride (K-DUR) 10 MEQ tablet Take 1 tablet (10 mEq total) by mouth daily. 90 tablet 3  . predniSONE (DELTASONE) 20 MG  tablet 3 tabs po day one, then 2 po daily x 4 days 11 tablet 0  . pyridoxine (B-6) 100 MG tablet Take 100 mg by mouth daily.     . ropinirole (REQUIP) 5 MG tablet Take 5 mg by mouth 3 (three) times daily.    . sodium chloride (OCEAN) 0.65 % SOLN nasal spray Place 1 spray into both nostrils every 3 (three) hours as needed for congestion.     . traMADol (ULTRAM) 50 MG tablet Take 50 mg by mouth every 12 (twelve) hours as needed for moderate pain.     Angela Lawson zolpidem (AMBIEN) 5 MG tablet Take 5 mg by mouth at bedtime as needed for sleep.    Huey Bienenstock (TECENTRIQ IV) Inject into the vein. Every 3 weeks    . doxycycline (VIBRAMYCIN) 100 MG capsule Take 1 capsule (100 mg total) by mouth 2 (two) times daily. 20 capsule 0  . lidocaine-prilocaine (EMLA) cream Apply to affected area once 30 g 3  . mupirocin cream (BACTROBAN) 2 % Apply 1 application topically 2 (two) times daily. 15 g 0  . Omega-3 Fatty Acids (FISH OIL PO) Take 1 capsule by mouth daily.    . ondansetron (ZOFRAN) 8 MG tablet Take 1 tablet (8 mg total) by mouth 2 (two) times daily as needed (Nausea or vomiting). 30 tablet 1  . polyethylene glycol powder (GLYCOLAX/MIRALAX) powder Take 1 capful daily as needed. 255 g 2  . potassium chloride (K-DUR) 10 MEQ tablet Take 1 tablet (10 mEq total) by mouth daily. *Further refills need to be authorized by PCP* 90 tablet 0  . prochlorperazine (COMPAZINE) 10 MG tablet Take 1 tablet (10 mg total) by mouth every 6 (six)  hours as needed (Nausea or vomiting). 30 tablet 1   No current facility-administered medications for this visit.     PHYSICAL EXAMINATION: ECOG PERFORMANCE STATUS: 2 - Symptomatic, <50% confined to bed  . Vitals:   05/15/17 1534  BP: 139/61  Pulse: 96  Resp: 18  Temp: 98 F (36.7 C)    Filed Weights   05/15/17 1534  Weight: 176 lb 6.4 oz (80 kg)   .Body mass index is 30.28 kg/m.  GENERAL:alert, in no acute distress and comfortable SKIN: no acute rashes, no significant lesions EYES: conjunctiva are pink and non-injected, sclera anicteric OROPHARYNX: MMM, no exudates, no oropharyngeal erythema or ulceration NECK: supple, no JVD LYMPH:  no palpable lymphadenopathy in the cervical, axillary or inguinal regions LUNGS: clear to auscultation b/l with normal respiratory effort HEART: regular rate & rhythm ABDOMEN:  normoactive bowel sounds , non tender, not distended. Extremity: no pedal edema PSYCH: alert & oriented x 3 with fluent speech NEURO: no focal motor/sensory deficits  LABORATORY DATA:   I have reviewed the data as listed  . CBC Latest Ref Rng & Units 05/28/2017 05/15/2017 04/26/2017  WBC 4.0 - 10.5 K/uL 5.6 7.9 8.5  Hemoglobin 12.0 - 15.0 g/dL 10.7(L) 10.9(L) 10.8(L)  Hematocrit 36.0 - 46.0 % 34.4(L) 34.7(L) 34.0(L)  Platelets 150 - 400 K/uL 207 189 193    . CMP Latest Ref Rng & Units 05/28/2017 05/15/2017 04/26/2017  Glucose 65 - 99 mg/dL 167(H) 165(H) 111(H)  BUN 6 - 20 mg/dL 31(H) 24(H) 16  Creatinine 0.44 - 1.00 mg/dL 1.69(H) 1.63(H) 1.44(H)  Sodium 135 - 145 mmol/L 138 137 142  Potassium 3.5 - 5.1 mmol/L 4.2 4.3 4.6  Chloride 101 - 111 mmol/L 103 100(L) 105  CO2 22 - 32 mmol/L 27 27 30  Calcium 8.9 - 10.3 mg/dL 9.0 8.9 10.1  Total Protein 6.5 - 8.1 g/dL 6.2(L) - 7.0  Total Bilirubin 0.3 - 1.2 mg/dL 0.3 - 0.5  Alkaline Phos 38 - 126 U/L 50 - 51  AST 15 - 41 U/L 22 - 19  ALT 14 - 54 U/L 14 - 15        RADIOGRAPHIC STUDIES: I have personally  reviewed the radiological images as listed and agreed with the findings in the report.  PET scan: 01/21/17     CT chest: 03/25/17                 ASSESSMENT & PLAN:   1) Adenocarcinoma of lung ([place]) now metastatic with b/l pulmonary involvement M1a disease. PDL1 0% Foundation One - Neg for EGFR, ALK, ROS1 and BRAF mutations.  Staging form: Lung, AJCC 7th Edition - Clinical: Stage IA - Signed by Baird Cancer, PA-C  - Pathologic stage from 04/26/2017: Stage IV (T2b(2), N3, M1a)    PLAN: -I discussed the patient's imaging studies, pathology results and molecular studies including Foundation 1 and PDL 1 results in details with the patient and her multiple accompanying family members. -We discussed that given her performance status she would not be a good candidate for platinum doublet chemotherapy. -We discussed her goals of care in detail.  -We discussed option for best supportive care vs Atezolizumab vs single agent chemotherapy -patient is agreeable to proceed with immunotherapy with Atezolizumab. Order plan built.  2) Rt toe infection s/p ingrown toe nail removal - still somewhat uncomfortable  -[facility] to [facility] followup for her rt toe infection s/p nail removal   Schedule for Atezolizumab in 7- 10 days if approved Chemo-counseling for Atezolizumab RTC with Dr Maylon Peppers in 1 week after 1st dose of Atezolizumab with labs for toxicity check. Please call Dr Petrie's office at [facility] to try to get followup for her rt toe infection s/p nail removal.  I spent 30 minutes counseling the patient face to face. The total time spent in the appointment was 40 minutes and more than 50% was on counseling and direct patient cares.    Sullivan Lone MD [place] AAHIVMS [facility] Hematology/Oncology Physician [facility] [place]  (Office):       [phone] (Work cell):  [phone] (Fax):           [phone]

## 2017-06-04 ENCOUNTER — Encounter (HOSPITAL_COMMUNITY): Payer: Self-pay

## 2017-06-04 ENCOUNTER — Encounter (HOSPITAL_BASED_OUTPATIENT_CLINIC_OR_DEPARTMENT_OTHER): Payer: Medicare Other | Admitting: Oncology

## 2017-06-04 VITALS — BP 114/66 | HR 80 | Resp 20 | Ht 64.0 in | Wt 177.5 lb

## 2017-06-04 DIAGNOSIS — C3490 Malignant neoplasm of unspecified part of unspecified bronchus or lung: Secondary | ICD-10-CM

## 2017-06-04 DIAGNOSIS — C3412 Malignant neoplasm of upper lobe, left bronchus or lung: Secondary | ICD-10-CM

## 2017-06-04 DIAGNOSIS — C3431 Malignant neoplasm of lower lobe, right bronchus or lung: Secondary | ICD-10-CM | POA: Diagnosis not present

## 2017-06-04 NOTE — Progress Notes (Signed)
[place], [place] [address]   CLINIC:  Medical Oncology/Hematology  PCP:  Rosita Fire, MD [place] [address] [phone]   REASON FOR VISIT:  New/recurrent adenocarcinoma of the left and right lung; h/o Stage IA NSCLC s/p resection in 2009.   CURRENT THERAPY: Initial work-up    BRIEF ONCOLOGIC HISTORY:  Oncology History   stage I A., non-small cell lung cancer, consistent with a poorly differentiated adenocarcinoma, status post resection 07/07/2008 in [place].       Adenocarcinoma of lung ([place])   07/07/2008 Surgery    Performed in [place], [place].  Consistent with poorly differentiated adenocarcinoma.      02/27/2014 Imaging    CT of chest demonstrating a RLL solid nodular component measuring 9 mm at the site of previous ground-glass opacity. Slight increase in size of an anterior mediastinal node measuring 1.1 cm with stable 1 cm pretracheal lymph node. Consider PET      03/20/2014 Imaging    PET scan- No hypermetabolic pulmonary lesions to suggest recurrent or metastatic pulmonary disease.  Weakly positive left internal mammary lymph node and left axillary lymph node (? inflammatory process involving the left breast).      11/07/2015 Imaging    CT chest- 1. No evidence of metastatic involvement of the lungs. The previously noted small noncalcified lung nodules are stable. 2. Vague focal opacity in the right lower lobe may represent scarring and is stable. Continued followup however is recommended in 1 year.      01/08/2017 Imaging    CT chest- 1. New nodules along a thick walled bullous lesion in the right lower lobe, suspicious for malignancy. 2. New fairly large region of confluent ground-glass opacity posteriorly in the remaining left lung, low grade adenocarcinoma is not excluded and there is mild new nodularity along the upper margin of this process. 3. Stable biapical pleuroparenchymal  scarring with several stable nodules in the left lung. 4. Borderline adenopathy in the mediastinum, essentially stable from prior. 5. Coronary, aortic arch, and branch vessel atherosclerotic vascular disease. 6. Small hiatal hernia. 7. Hypodense left thyroid lesion. If not previously worked up, thyroid ultrasound could be employed.      01/21/2017 PET scan    1. New hypermetabolic adenopathy in the mediastinum and hila, with progressive hypermetabolic ground-glass density posteriorly in the left lower lobe with faint nodular components, and some low-grade hypermetabolic activity in ground-glass opacity dependently in the right upper lobe. Although the appearance is concerning for malignancy potentially with a lymphangitic component in the left lower lobe, the unusual configuration in the appearance of the ground-glass opacities could conceivably represent an atypical granulomatous infectious process as an alternative. This is not classic obvious recurrence with well-defined mass or nodules. 2. There is some ill-defined clustered nodularity in the right lower lobe only faintly metabolic, maximum SUV 3.0. 3. Overall I believe that this process require surveillance to differentiate an inflammatory/infectious condition from recurrent malignancy. The adenopathy is certainly concerning. 4. No findings of malignancy in the neck, abdomen/pelvis, or skeleton. 5. Sigmoid colon diverticulosis. 6. Emphysema. 7. Chronic ethmoid sinusitis.      03/25/2017 Imaging    CT chest- Stable 5.9 cm left lower lobe ground-glass opacity, hypermetabolic on prior PET, worrisome for primary bronchogenic neoplasm such as low-grade adenocarcinoma.  Stable 2.2 cm cavitary lesion in the central right lower lobe with surrounding hypermetabolic nodularity measuring up to 1.8 cm, worrisome for primary bronchogenic neoplasm such as  squamous cell carcinoma.  Hypermetabolic mediastinal lymph nodes measuring up  to 11 mm short axis, as above. Bilateral hilar regions are hypermetabolic on PET but poorly evaluated on unenhanced CT.  Emphysema and aortic atherosclerosis.      04/23/2017 Procedure    Bronchoscopy and EBUS with mediastinal lymph node aspirations by Dr. Roxan Hockey.      04/26/2017 Pathology Results    1. Lung, biopsy, Left upper lobe - ADENOCARCINOMA. - SEE COMMENT. 2. Lung, biopsy, Right lower lobe - ADENOCARCINOMA. - SEE COMMENT. 3. Lung, biopsy, Right lower lobe target 2 - ATYPICAL CELLS. - SEE COMMENT.      04/26/2017 Relapse/Recurrence         05/14/2017 Pathology Results    PDL1 NEGATIVE.  TPS 0%.      05/19/2017 Pathology Results    [employer] testing- MS-stable.  TMB 6 Muts/Mb (Intermediate).  STK11 E293*.  Negative for EGFR, KRAS, ALK, BRAF, MET, RET, ERBB2, and ROS1.  Potential treatment options: Atezolizumab/Durvalumab/Nivolumab/Pembrolizumab for TMB score.        HISTORY OF PRESENT ILLNESS:  (From Kirby Crigler, PA-C's last note on 12/05/16)   On 01/07/17, she had CT chest for surveillance of her lung cancer; new nodules were noted and concerning for malignancy.  She was recently admitted on 01/10/17-01/13/17 for COPD exacerbation; she was discharged on oral steroids at that time.  On 01/21/17, she underwent PET scan revealing new hypermetabolic adenopathy in the mediastinum and hilum, with progressive hypermetabolic groundglass density in the left lower lobe; there is mention of possible concern of malignancy with a lymphangitic component in the left lower lobe, but this may also represent an atypical granulomatous infectious process as well; there were no findings of malignancy in the neck, abdomen/pelvis, or skeleton. It was recommended by cardiothoracic surgery that we repeat imaging in 2-3 months after hospitalization to re-assess lesions noted to CT and PET scans.     INTERVAL HISTORY: Patient presents for follow-up today with her daughter. Patient received  cycle 1 of Tecentriq on 05/28/17 and tolerated her treatment well without any side effects. Overall she states that she feels well. She denies any fatigue, chest pain, shortness of breath above her baseline, abdominal pain, focal weakness.   REVIEW OF SYSTEMS:  Review of Systems  Constitutional: Negative for appetite change, chills, fatigue and fever.  HENT:   Negative for hearing loss, lump/mass, mouth sores, sore throat, tinnitus and trouble swallowing (improved).   Eyes: Negative.  Negative for eye problems and icterus.  Respiratory: Positive for shortness of breath (at baseline). Negative for chest tightness, cough, hemoptysis and wheezing.   Cardiovascular: Negative.  Negative for chest pain, leg swelling and palpitations.  Gastrointestinal: Negative.  Negative for abdominal distention, abdominal pain, blood in stool, constipation, diarrhea, nausea and vomiting.  Endocrine: Negative.  Negative for hot flashes.  Genitourinary: Negative.  Negative for difficulty urinating, dysuria, frequency, hematuria and vaginal bleeding.   Musculoskeletal: Negative.  Negative for arthralgias and neck pain.  Skin: Negative.  Negative for itching and rash.  Neurological: Negative for dizziness, headaches and speech difficulty.  Hematological: Negative for adenopathy. Does not bruise/bleed easily.  Psychiatric/Behavioral: Negative.  Negative for confusion. The patient is not nervous/anxious.      PAST MEDICAL/SURGICAL HISTORY:  Past Medical History:  Diagnosis Date  . Adenocarcinoma of lung ([facility])    Left lung 2009, resected  . Anginal pain ([facility])   . Arthritis   . Back pain   . CHF (congestive heart failure) ([facility])   .  COPD (chronic obstructive pulmonary disease) ([facility])   . Diverticulitis   . Dyslipidemia   . Essential hypertension   . H/O ventral hernia   . Non-obstructive CAD    a. 04/2015 NSTEMI/Cath: LAD 10p, LCX 23m RCA 351m20d, EF 35-40 w/ apical ballooning.  . On home O2    3L N/C  .  Osteoporosis   . Osteoporosis 11/04/2015   Managed by Dr. FaLegrand Rams . Parkinson's disease ([facility]  . Shortness of breath   . Takotsubo cardiomyopathy    a. 04/2015 Echo: EF 45-50%, mid-dist anterior/apical/inferoapical HK w/ hyperdynamic base. Gr 1 DD, mild AI, mild-mod MR, triv TR, PASP 4817m;  b. 04/2015 LV gram: Ef 35-40% w/ apical ballooning.  . Type II diabetes mellitus ([facility] . Ventricular bigeminy    a. 04/2015 in setting of NSTEMI/Takotsubo.   Past Surgical History:  Procedure Laterality Date  . ABDOMINAL HYSTERECTOMY    . CARDIAC CATHETERIZATION N/A 04/17/2015   Procedure: Left Heart Cath and Coronary Angiography;  Surgeon: ThoTroy SineD;  Location[facility];  Service: Cardiovascular;  Laterality: N/A;  . CHOLECYSTECTOMY    . COLONOSCOPY N/A 09/18/2014   Procedure: COLONOSCOPY;  Surgeon: SanDanie BinderD;  Location: [facility];  Service: Endoscopy;  Laterality: N/A;  8:30 AM - moved to 10:30 - CRosendo Gros notify pt  . ECTOPIC PREGNANCY SURGERY    . INCISIONAL HERNIA REPAIR N/A 08/26/2013   Procedure: HERNIA REPAIR INCISIONAL WITH MESH;  Surgeon: MarJamesetta SoD;  Location: [facility];  Service: General;  Laterality: N/A;  . IR FLUORO GUIDE PORT INSERTION RIGHT  04/30/2017  . IR US [place] VASC ACCESS RIGHT  04/30/2017  . LUNG CANCER SURGERY    . VIDEO BRONCHOSCOPY WITH ENDOBRONCHIAL NAVIGATION N/A 04/23/2017   Procedure: VIDEO BRONCHOSCOPY WITH ENDOBRONCHIAL NAVIGATION;  Surgeon: HenMelrose NakayamaD;  [facility];  Laterality: N/A;  . VIDEO BRONCHOSCOPY WITH ENDOBRONCHIAL ULTRASOUND N/A 04/23/2017   Procedure: VIDEO BRONCHOSCOPY WITH ENDOBRONCHIAL ULTRASOUND;  Surgeon: HenMelrose NakayamaD;  [facility];  Laterality: N/A;     SOCIAL HISTORY:  Social History   Social History  . Marital status: Widowed    Spouse name: N/A  . Number of children: N/A  . Years of education: N/A   Occupational History  . Not  on file.   Social History Main Topics  . Smoking status: Former Smoker    Years: 20.00    Types: Cigarettes    Quit date: 08/13/2006  . Smokeless tobacco: Never Used  . Alcohol use No  . Drug use: No  . Sexual activity: Not Currently    Birth control/ protection: Surgical   Other Topics Concern  . Not on file   Social History Narrative  . No narrative on file    FAMILY HISTORY:  Family History  Problem Relation Age of Onset  . Diabetes Mother   . Hypertension Mother   . Diabetes Father   . Asthma Unknown   . Cancer Unknown   . Heart attack Sister        X2  . Colon cancer Neg Hx     CURRENT MEDICATIONS:  Outpatient Encounter Prescriptions as of 06/04/2017  Medication Sig  . albuterol (PROAIR HFA) 108 (90 BASE) MCG/ACT inhaler Inhale 2 puffs into the lungs every 4 (four) hours as needed for wheezing or shortness of breath.  . aMarland Kitchenendronate (FOSAMAX) 70 MG  tablet Take 70 mg by mouth every Friday.   Marland Kitchen aspirin EC 81 MG tablet Take 81 mg by mouth daily.  Huey Bienenstock (TECENTRIQ IV) Inject into the vein. Every 3 weeks  . atorvastatin (LIPITOR) 40 MG tablet Take 40 mg by mouth at bedtime.   Marland Kitchen buPROPion (WELLBUTRIN SR) 150 MG 12 hr tablet Take 150 mg by mouth 2 (two) times daily.  . cholecalciferol (VITAMIN D) 1000 units tablet Take 1,000 Units by mouth daily.  Marland Kitchen doxycycline (VIBRAMYCIN) 100 MG capsule Take 1 capsule (100 mg total) by mouth 2 (two) times daily.  . furosemide (LASIX) 40 MG tablet Take 40 mg by mouth daily. Pt is able to take one additional tablet daily for weight increases of 3lbs in a day or 5lbs in a week.  . gabapentin (NEURONTIN) 300 MG capsule Take 600 mg by mouth at bedtime.   . Garlic 270 MG TABS Take 300 mg by mouth daily.  Marland Kitchen ipratropium-albuterol (DUONEB) 0.5-2.5 (3) MG/3ML SOLN Inhale 3 mLs into the lungs every 6 (six) hours as needed (for wheezing/shortness of breath).   Marland Kitchen LANTUS SOLOSTAR 100 UNIT/ML Solostar Pen Inject 15 Units into the skin at  bedtime.   . lidocaine-prilocaine (EMLA) cream Apply to affected area once  . linagliptin (TRADJENTA) 5 MG TABS tablet Take 5 mg by mouth daily.  Marland Kitchen loratadine (CLARITIN) 10 MG tablet Take 10 mg by mouth daily.  Marland Kitchen losartan (COZAAR) 50 MG tablet Take 50 mg by mouth daily.  . meclizine (ANTIVERT) 25 MG tablet Take 25 mg by mouth daily.   . metoprolol succinate (TOPROL-XL) 25 MG 24 hr tablet Take 1 tablet (25 mg total) by mouth daily.  . mupirocin cream (BACTROBAN) 2 % Apply 1 application topically 2 (two) times daily.  . nitroGLYCERIN (NITROSTAT) 0.4 MG SL tablet Place 0.4 mg under the tongue every 5 (five) minutes as needed for chest pain.  . Omega-3 Fatty Acids (FISH OIL PO) Take 1 capsule by mouth daily.  Marland Kitchen omeprazole (PRILOSEC) 40 MG capsule Take 40 mg by mouth daily.  . ondansetron (ZOFRAN) 8 MG tablet Take 1 tablet (8 mg total) by mouth 2 (two) times daily as needed (Nausea or vomiting).  . polyethylene glycol powder (GLYCOLAX/MIRALAX) powder Take 1 capful daily as needed.  . potassium chloride (K-DUR) 10 MEQ tablet Take 1 tablet (10 mEq total) by mouth daily.  . potassium chloride (K-DUR) 10 MEQ tablet Take 1 tablet (10 mEq total) by mouth daily. *Further refills need to be authorized by PCP*  . predniSONE (DELTASONE) 20 MG tablet 3 tabs po day one, then 2 po daily x 4 days  . prochlorperazine (COMPAZINE) 10 MG tablet Take 1 tablet (10 mg total) by mouth every 6 (six) hours as needed (Nausea or vomiting).  . pyridoxine (B-6) 100 MG tablet Take 100 mg by mouth daily.   . ropinirole (REQUIP) 5 MG tablet Take 5 mg by mouth 3 (three) times daily.  . sodium chloride (OCEAN) 0.65 % SOLN nasal spray Place 1 spray into both nostrils every 3 (three) hours as needed for congestion.   . traMADol (ULTRAM) 50 MG tablet Take 50 mg by mouth every 12 (twelve) hours as needed for moderate pain.   Marland Kitchen zolpidem (AMBIEN) 5 MG tablet Take 5 mg by mouth at bedtime as needed for sleep.   No facility-administered  encounter medications on file as of 06/04/2017.     ALLERGIES:  Allergies  Allergen Reactions  . Lisinopril Cough  PHYSICAL EXAM:  ECOG Performance status: 2-3  Vitals:   06/04/17 1338  BP: 114/66  Pulse: 80  Resp: 20     Physical Exam  Constitutional: She is oriented to person, place, and time and well-developed, well-nourished, and in no distress. No distress.  oxygen dependent with portable oxygen Able to get onto the examining table  HENT:  Head: Normocephalic and atraumatic.  Mouth/Throat: Oropharynx is clear and moist. No oropharyngeal exudate.  Eyes: Conjunctivae are normal. Pupils are equal, round, and reactive to light. No scleral icterus.  Neck: Normal range of motion. Neck supple. No JVD present.  Cardiovascular: Normal rate, regular rhythm and normal heart sounds.  Exam reveals no gallop and no friction rub.   No murmur heard. Pulmonary/Chest: Effort normal. No respiratory distress. She has no wheezes. She has no rhonchi. She has no rales.  O2 via nasal cannula in place  Abdominal: Soft. Bowel sounds are normal. She exhibits no distension. There is no tenderness. There is no rebound and no guarding.  Musculoskeletal: Normal range of motion. She exhibits no edema or tenderness.  Lymphadenopathy:    She has no cervical adenopathy.  Neurological: She is alert and oriented to person, place, and time. No cranial nerve deficit.  Skin: Skin is warm and dry. No rash noted. No erythema. No pallor.  Psychiatric: Mood, memory, affect and judgment normal.  Nursing note and vitals reviewed.    LABORATORY DATA:  I have reviewed the labs as listed.  CBC    Component Value Date/Time   WBC 5.6 05/28/2017 1012   RBC 3.58 (L) 05/28/2017 1012   HGB 10.7 (L) 05/28/2017 1012   HCT 34.4 (L) 05/28/2017 1012   PLT 207 05/28/2017 1012   MCV 96.1 05/28/2017 1012   MCH 29.9 05/28/2017 1012   MCHC 31.1 05/28/2017 1012   RDW 14.9 05/28/2017 1012   LYMPHSABS 1.3 05/28/2017  1012   MONOABS 0.6 05/28/2017 1012   EOSABS 0.2 05/28/2017 1012   BASOSABS 0.0 05/28/2017 1012   CMP Latest Ref Rng & Units 05/28/2017 05/15/2017 04/26/2017  Glucose 65 - 99 mg/dL 167(H) 165(H) 111(H)  BUN 6 - 20 mg/dL 31(H) 24(H) 16  Creatinine 0.44 - 1.00 mg/dL 1.69(H) 1.63(H) 1.44(H)  Sodium 135 - 145 mmol/L 138 137 142  Potassium 3.5 - 5.1 mmol/L 4.2 4.3 4.6  Chloride 101 - 111 mmol/L 103 100(L) 105  CO2 22 - 32 mmol/L '27 27 30  '$ Calcium 8.9 - 10.3 mg/dL 9.0 8.9 10.1  Total Protein 6.5 - 8.1 g/dL 6.2(L) - 7.0  Total Bilirubin 0.3 - 1.2 mg/dL 0.3 - 0.5  Alkaline Phos 38 - 126 U/L 50 - 51  AST 15 - 41 U/L 22 - 19  ALT 14 - 54 U/L 14 - 15    PENDING LABS:    DIAGNOSTIC IMAGING:  PET scan: 01/21/17     CT chest: 03/25/17      PATHOLOGY:     ASSESSMENT & PLAN:  Cancer Staging Adenocarcinoma of lung ([place]) Staging form: Lung, AJCC 7th Edition - Clinical: Stage IA - Signed by Baird Cancer, PA-C on 02/27/2014 - Pathologic stage from 04/26/2017: Stage IV (T2b(2), N3, M1a) - Signed by Baird Cancer, PA-C on 04/26/2017   PLAN: -Patient tolerated cycle 1 of tecentriq very well without any side effects. Proceed with cycle 2 as scheduled. -Plan to restage with CT chest/abdomen/pelvis after cycle 3. -Return to clinic for follow-up with cycle 2 of Tecentriq.  All questions were answered to  patient's stated satisfaction. Encouraged patient to call with any new concerns or questions before her next visit to the cancer center and we can certain see her sooner, if needed.     Twana First, MD

## 2017-06-06 LAB — ACID FAST CULTURE WITH REFLEXED SENSITIVITIES (MYCOBACTERIA): Acid Fast Culture: NEGATIVE

## 2017-06-17 ENCOUNTER — Other Ambulatory Visit (INDEPENDENT_AMBULATORY_CARE_PROVIDER_SITE_OTHER): Payer: Self-pay | Admitting: Orthopedic Surgery

## 2017-06-17 DIAGNOSIS — L97529 Non-pressure chronic ulcer of other part of left foot with unspecified severity: Secondary | ICD-10-CM

## 2017-06-18 ENCOUNTER — Encounter (HOSPITAL_COMMUNITY): Payer: Medicare Other | Attending: Hematology & Oncology

## 2017-06-18 ENCOUNTER — Ambulatory Visit (INDEPENDENT_AMBULATORY_CARE_PROVIDER_SITE_OTHER): Payer: Medicare Other | Admitting: Orthopedic Surgery

## 2017-06-18 ENCOUNTER — Encounter (HOSPITAL_COMMUNITY): Payer: Self-pay

## 2017-06-18 VITALS — BP 119/52 | HR 71 | Temp 98.3°F | Resp 18 | Wt 178.8 lb

## 2017-06-18 DIAGNOSIS — Z5112 Encounter for antineoplastic immunotherapy: Secondary | ICD-10-CM

## 2017-06-18 DIAGNOSIS — C3432 Malignant neoplasm of lower lobe, left bronchus or lung: Secondary | ICD-10-CM | POA: Diagnosis not present

## 2017-06-18 DIAGNOSIS — C3490 Malignant neoplasm of unspecified part of unspecified bronchus or lung: Secondary | ICD-10-CM

## 2017-06-18 DIAGNOSIS — C3492 Malignant neoplasm of unspecified part of left bronchus or lung: Secondary | ICD-10-CM | POA: Insufficient documentation

## 2017-06-18 DIAGNOSIS — C3431 Malignant neoplasm of lower lobe, right bronchus or lung: Secondary | ICD-10-CM

## 2017-06-18 DIAGNOSIS — Z7189 Other specified counseling: Secondary | ICD-10-CM

## 2017-06-18 LAB — CBC WITH DIFFERENTIAL/PLATELET
Basophils Absolute: 0 10*3/uL (ref 0.0–0.1)
Basophils Relative: 0 %
Eosinophils Absolute: 0.5 10*3/uL (ref 0.0–0.7)
Eosinophils Relative: 6 %
HCT: 37.6 % (ref 36.0–46.0)
Hemoglobin: 11.9 g/dL — ABNORMAL LOW (ref 12.0–15.0)
Lymphocytes Relative: 21 %
Lymphs Abs: 1.7 10*3/uL (ref 0.7–4.0)
MCH: 29.9 pg (ref 26.0–34.0)
MCHC: 31.6 g/dL (ref 30.0–36.0)
MCV: 94.5 fL (ref 78.0–100.0)
Monocytes Absolute: 0.9 10*3/uL (ref 0.1–1.0)
Monocytes Relative: 11 %
Neutro Abs: 5.1 10*3/uL (ref 1.7–7.7)
Neutrophils Relative %: 62 %
Platelets: 178 10*3/uL (ref 150–400)
RBC: 3.98 MIL/uL (ref 3.87–5.11)
RDW: 14.5 % (ref 11.5–15.5)
WBC: 8.1 10*3/uL (ref 4.0–10.5)

## 2017-06-18 LAB — COMPREHENSIVE METABOLIC PANEL
ALT: 20 U/L (ref 14–54)
AST: 24 U/L (ref 15–41)
Albumin: 3.9 g/dL (ref 3.5–5.0)
Alkaline Phosphatase: 61 U/L (ref 38–126)
Anion gap: 8 (ref 5–15)
BUN: 21 mg/dL — ABNORMAL HIGH (ref 6–20)
CO2: 31 mmol/L (ref 22–32)
Calcium: 10.1 mg/dL (ref 8.9–10.3)
Chloride: 96 mmol/L — ABNORMAL LOW (ref 101–111)
Creatinine, Ser: 1.63 mg/dL — ABNORMAL HIGH (ref 0.44–1.00)
GFR calc Af Amer: 33 mL/min — ABNORMAL LOW (ref >60)
GFR calc non Af Amer: 28 mL/min — ABNORMAL LOW (ref >60)
Glucose, Bld: 141 mg/dL — ABNORMAL HIGH (ref 65–99)
Potassium: 4.2 mmol/L (ref 3.5–5.1)
Sodium: 135 mmol/L (ref 135–145)
Total Bilirubin: 0.8 mg/dL (ref 0.3–1.2)
Total Protein: 7.4 g/dL (ref 6.5–8.1)

## 2017-06-18 MED ORDER — SODIUM CHLORIDE 0.9 % IV SOLN
1200.0000 mg | Freq: Once | INTRAVENOUS | Status: AC
  Administered 2017-06-18: 1200 mg via INTRAVENOUS
  Filled 2017-06-18: qty 20

## 2017-06-18 MED ORDER — SODIUM CHLORIDE 0.9 % IV SOLN
Freq: Once | INTRAVENOUS | Status: AC
  Administered 2017-06-18: 13:00:00 via INTRAVENOUS

## 2017-06-18 MED ORDER — HEPARIN SOD (PORK) LOCK FLUSH 100 UNIT/ML IV SOLN
500.0000 [IU] | Freq: Once | INTRAVENOUS | Status: AC | PRN
  Administered 2017-06-18: 500 [IU]

## 2017-06-18 MED ORDER — SODIUM CHLORIDE 0.9% FLUSH: 10.0000 mL | INTRAVENOUS | Status: DC | PRN

## 2017-06-18 NOTE — Patient Instructions (Signed)
[facility] Discharge Instructions for Patients Receiving Chemotherapy   Beginning January 23rd 2017 lab work for the Cancer Center will be done in the  Main lab at [facility] on 1st floor. If you have a lab appointment with the Cancer Center please come in thru the  Main Entrance and check in at the main information desk   Today you received the following chemotherapy agents   To help prevent nausea and vomiting after your treatment, we encourage you to take your nausea medication     If you develop nausea and vomiting, or diarrhea that is not controlled by your medication, call the clinic.  The clinic phone number is [phone]. Office hours are Monday-Friday 8:30am-5:00pm.  BELOW ARE SYMPTOMS THAT SHOULD BE REPORTED IMMEDIATELY:  *FEVER GREATER THAN 101.0 F  *CHILLS WITH OR WITHOUT FEVER  NAUSEA AND VOMITING THAT IS NOT CONTROLLED WITH YOUR NAUSEA MEDICATION  *UNUSUAL SHORTNESS OF BREATH  *UNUSUAL BRUISING OR BLEEDING  TENDERNESS IN MOUTH AND THROAT WITH OR WITHOUT PRESENCE OF ULCERS  *URINARY PROBLEMS  *BOWEL PROBLEMS  UNUSUAL RASH Items with * indicate a potential emergency and should be followed up as soon as possible. If you have an emergency after office hours please contact your primary care physician or go to the nearest emergency department.  Please call the clinic during office hours if you have any questions or concerns.   You may also contact the Patient Navigator at [phone] should you have any questions or need assistance in obtaining follow up care.      Resources For Cancer Patients and their Caregivers ? American Cancer Society: Can assist with transportation, wigs, general needs, runs Look Good Feel Better.        1-888-227-6333 ? Cancer Care: Provides financial assistance, online support groups, medication/co-pay assistance.  1-800-813-HOPE (4673) ? [facility] Assists [place] cancer  patients and their families through emotional , educational and financial support.  336-427-4357 ? [place] DSS Where to apply for food stamps, Medicaid and utility assistance. 336-342-1394 ? [facility]: Transportation to medical appointments. 336-347-2287 ? Social Security Administration: May apply for disability if have a Stage IV cancer. 336-342-7796 1-800-772-1213 ? [place] Aging, Disability and Transit Services: Assists with nutrition, care and transit needs. 336-349-2343         

## 2017-06-18 NOTE — Progress Notes (Signed)
Chemotherapy given today per orders. Patient tolerated it well without problems. Vitals stable and discharged home from clinic via wheelchair. Follow up as scheduled.

## 2017-06-22 ENCOUNTER — Other Ambulatory Visit: Payer: Self-pay

## 2017-06-22 ENCOUNTER — Emergency Department (HOSPITAL_COMMUNITY)
Admission: EM | Admit: 2017-06-22 | Discharge: 2017-06-23 | Disposition: A | Discharge status: Home / Self Care | Payer: Medicare Other | Attending: Emergency Medicine | Admitting: Emergency Medicine

## 2017-06-22 ENCOUNTER — Telehealth (HOSPITAL_COMMUNITY): Payer: Self-pay | Admitting: Emergency Medicine

## 2017-06-22 ENCOUNTER — Emergency Department (HOSPITAL_COMMUNITY): Payer: Medicare Other

## 2017-06-22 ENCOUNTER — Encounter (HOSPITAL_COMMUNITY): Payer: Self-pay

## 2017-06-22 DIAGNOSIS — E119 Type 2 diabetes mellitus without complications: Secondary | ICD-10-CM | POA: Insufficient documentation

## 2017-06-22 DIAGNOSIS — Z7984 Long term (current) use of oral hypoglycemic drugs: Secondary | ICD-10-CM | POA: Insufficient documentation

## 2017-06-22 DIAGNOSIS — R05 Cough: Secondary | ICD-10-CM | POA: Diagnosis not present

## 2017-06-22 DIAGNOSIS — Z87891 Personal history of nicotine dependence: Secondary | ICD-10-CM | POA: Diagnosis not present

## 2017-06-22 DIAGNOSIS — G20 Parkinson's disease: Secondary | ICD-10-CM | POA: Insufficient documentation

## 2017-06-22 DIAGNOSIS — Z7982 Long term (current) use of aspirin: Secondary | ICD-10-CM | POA: Insufficient documentation

## 2017-06-22 DIAGNOSIS — J441 Chronic obstructive pulmonary disease with (acute) exacerbation: Secondary | ICD-10-CM | POA: Diagnosis not present

## 2017-06-22 DIAGNOSIS — Z79899 Other long term (current) drug therapy: Secondary | ICD-10-CM | POA: Insufficient documentation

## 2017-06-22 DIAGNOSIS — Z794 Long term (current) use of insulin: Secondary | ICD-10-CM | POA: Insufficient documentation

## 2017-06-22 DIAGNOSIS — Z85110 Personal history of malignant carcinoid tumor of bronchus and lung: Secondary | ICD-10-CM | POA: Diagnosis not present

## 2017-06-22 DIAGNOSIS — I110 Hypertensive heart disease with heart failure: Secondary | ICD-10-CM | POA: Insufficient documentation

## 2017-06-22 DIAGNOSIS — I509 Heart failure, unspecified: Secondary | ICD-10-CM | POA: Insufficient documentation

## 2017-06-22 DIAGNOSIS — R0602 Shortness of breath: Secondary | ICD-10-CM | POA: Diagnosis present

## 2017-06-22 LAB — CBC WITH DIFFERENTIAL/PLATELET
Basophils Absolute: 0 10*3/uL (ref 0.0–0.1)
Basophils Relative: 0 %
Eosinophils Absolute: 0.6 10*3/uL (ref 0.0–0.7)
Eosinophils Relative: 8 %
HCT: 38.1 % (ref 36.0–46.0)
Hemoglobin: 12 g/dL (ref 12.0–15.0)
Lymphocytes Relative: 27 %
Lymphs Abs: 2 10*3/uL (ref 0.7–4.0)
MCH: 30.2 pg (ref 26.0–34.0)
MCHC: 31.5 g/dL (ref 30.0–36.0)
MCV: 96 fL (ref 78.0–100.0)
Monocytes Absolute: 0.8 10*3/uL (ref 0.1–1.0)
Monocytes Relative: 11 %
Neutro Abs: 4 10*3/uL (ref 1.7–7.7)
Neutrophils Relative %: 54 %
Platelets: 210 10*3/uL (ref 150–400)
RBC: 3.97 MIL/uL (ref 3.87–5.11)
RDW: 14.8 % (ref 11.5–15.5)
WBC: 7.4 10*3/uL (ref 4.0–10.5)

## 2017-06-22 LAB — BASIC METABOLIC PANEL
Anion gap: 9 (ref 5–15)
BUN: 21 mg/dL — ABNORMAL HIGH (ref 6–20)
CO2: 30 mmol/L (ref 22–32)
Calcium: 9.9 mg/dL (ref 8.9–10.3)
Chloride: 99 mmol/L — ABNORMAL LOW (ref 101–111)
Creatinine, Ser: 1.68 mg/dL — ABNORMAL HIGH (ref 0.44–1.00)
GFR calc Af Amer: 32 mL/min — ABNORMAL LOW (ref >60)
GFR calc non Af Amer: 27 mL/min — ABNORMAL LOW (ref >60)
Glucose, Bld: 175 mg/dL — ABNORMAL HIGH (ref 65–99)
Potassium: 4.6 mmol/L (ref 3.5–5.1)
Sodium: 138 mmol/L (ref 135–145)

## 2017-06-22 LAB — BRAIN NATRIURETIC PEPTIDE: B Natriuretic Peptide: 43 pg/mL (ref 0.0–100.0)

## 2017-06-22 MED ORDER — ALBUTEROL SULFATE (2.5 MG/3ML) 0.083% IN NEBU
2.5000 mg | INHALATION_SOLUTION | Freq: Once | RESPIRATORY_TRACT | Status: AC
  Administered 2017-06-22: 2.5 mg via RESPIRATORY_TRACT
  Filled 2017-06-22: qty 3

## 2017-06-22 MED ORDER — ALBUTEROL SULFATE (2.5 MG/3ML) 0.083% IN NEBU
5.0000 mg | INHALATION_SOLUTION | Freq: Once | RESPIRATORY_TRACT | Status: AC
  Administered 2017-06-22: 5 mg via RESPIRATORY_TRACT
  Filled 2017-06-22: qty 6

## 2017-06-22 MED ORDER — IPRATROPIUM-ALBUTEROL 0.5-2.5 (3) MG/3ML IN SOLN
3.0000 mL | Freq: Once | RESPIRATORY_TRACT | Status: AC
  Administered 2017-06-22: 3 mL via RESPIRATORY_TRACT
  Filled 2017-06-22: qty 3

## 2017-06-22 NOTE — Telephone Encounter (Signed)
Pt called and stated that she has problems with her hemorrhoids and that she was seeing a little bright red blood.  I told her to make sure she was using treatment for her hemorrhoids, such as preparation H and witch hazel, she verbalized understanding.

## 2017-06-22 NOTE — ED Provider Notes (Signed)
[facility] DEPT Provider Note   CSN: [id number] Arrival date & time: 06/22/17  2217     History   Chief Complaint Chief Complaint  Patient presents with  . Shortness of Breath    HPI Angela Lawson is a 83 y.o. female.  Patient with history of COPD and heart failure, chronically on oxygen at home presents to the ER with worsening shortness of breath. Symptoms are present for 2 days. She has had a productive cough associated with shortness of breath, no chest pain and no fever. She has used her albuterol at home with only partial relief. She reports that symptoms worsen when she ambulates or moves around. She has not noticed any increased swelling of her legs.   The history is provided by the patient.  Shortness of Breath  This is a recurrent problem. The average episode lasts 2 days. The problem occurs continuously.The problem has been gradually worsening. Associated symptoms include cough, sputum production and wheezing. Pertinent negatives include no fever, no rhinorrhea, no sore throat and no chest pain. The problem's precipitants include weather/humidity. She has tried beta-agonist inhalers for the symptoms. The treatment provided mild relief. She has had prior hospitalizations. Associated medical issues include COPD and heart failure.    Past Medical History:  Diagnosis Date  . Adenocarcinoma of lung ([place])    Left lung 2009, resected  . Anginal pain ([place])   . Arthritis   . Back pain   . CHF (congestive heart failure) ([place])   . COPD (chronic obstructive pulmonary disease) ([place])   . Diverticulitis   . Dyslipidemia   . Essential hypertension   . H/O ventral hernia   . Non-obstructive CAD    a. 04/2015 NSTEMI/Cath: LAD 10p, LCX 49m, RCA 62m, 20d, EF 35-40 w/ apical ballooning.  . On home O2    3L N/C  . Osteoporosis   . Osteoporosis 11/04/2015   Managed by Dr. Legrand Rams   . Parkinson's disease ([place])   . Shortness of breath   . Takotsubo cardiomyopathy    a. 04/2015  Echo: EF 45-50%, mid-dist anterior/apical/inferoapical HK w/ hyperdynamic base. Gr 1 DD, mild AI, mild-mod MR, triv TR, PASP 2mmHg;  b. 04/2015 LV gram: Ef 35-40% w/ apical ballooning.  . Type II diabetes mellitus ([place])   . Ventricular bigeminy    a. 04/2015 in setting of NSTEMI/Takotsubo.    Patient Active Problem List   Diagnosis Date Noted  . Counseling regarding advanced care planning and goals of care 05/15/2017  . Acute renal injury ([place]) 04/04/2017  . Chest pain 04/04/2017  . Nausea and vomiting 04/04/2017  . COPD with acute exacerbation ([place]) 04/25/2016  . Acute on chronic respiratory failure with hypoxia ([place]) 04/25/2016  . SOB (shortness of breath)   . Osteoporosis 11/04/2015  . CHF (congestive heart failure) ([place]) 08/01/2015  . CHF exacerbation ([place]) 07/09/2015  . Essential hypertension 04/19/2015  . Type II diabetes mellitus ([place]) 04/19/2015  . Dyslipidemia 04/19/2015  . History of lung cancer 04/19/2015  . Takotsubo cardiomyopathy 04/19/2015  . Ventricular bigeminy 04/19/2015  . NSTEMI (non-ST elevated myocardial infarction) ([place]) 04/19/2015  . Stress-induced cardiomyopathy 04/18/2015  . Elevated troponin 04/16/2015  . COPD exacerbation ([place]) 04/16/2015  . CKD (chronic kidney disease) stage 3, GFR 30-59 ml/min 04/16/2015  . Herpes genitalis in women 11/24/2014  . Toe fracture 09/28/2014  . Non-traumatic tear of right rotator cuff 11/30/2013  . Pain in joint, shoulder region 11/30/2013  . Muscle weakness (generalized) 11/30/2013  . Bursitis  of left shoulder 02/09/2013  . Adenocarcinoma of lung ([place]) 08/20/2012    Past Surgical History:  Procedure Laterality Date  . ABDOMINAL HYSTERECTOMY    . CARDIAC CATHETERIZATION N/A 04/17/2015   Procedure: Left Heart Cath and Coronary Angiography;  Surgeon: Troy Sine, MD;  Location: [facility];  Service: Cardiovascular;  Laterality: N/A;  . CHOLECYSTECTOMY    . COLONOSCOPY N/A 09/18/2014   Procedure:  COLONOSCOPY;  Surgeon: Danie Binder, MD;  Location: [facility];  Service: Endoscopy;  Laterality: N/A;  8:30 AM - moved to 10:30 Rosendo Gros to notify pt  . ECTOPIC PREGNANCY SURGERY    . INCISIONAL HERNIA REPAIR N/A 08/26/2013   Procedure: HERNIA REPAIR INCISIONAL WITH MESH;  Surgeon: Jamesetta So, MD;  Location: [facility];  Service: General;  Laterality: N/A;  . IR FLUORO GUIDE PORT INSERTION RIGHT  04/30/2017  . IR US GUIDE VASC ACCESS RIGHT  04/30/2017  . LUNG CANCER SURGERY    . VIDEO BRONCHOSCOPY WITH ENDOBRONCHIAL NAVIGATION N/A 04/23/2017   Procedure: VIDEO BRONCHOSCOPY WITH ENDOBRONCHIAL NAVIGATION;  Surgeon: Melrose Nakayama, MD;  Location: [facility];  Service: Thoracic;  Laterality: N/A;  . VIDEO BRONCHOSCOPY WITH ENDOBRONCHIAL ULTRASOUND N/A 04/23/2017   Procedure: VIDEO BRONCHOSCOPY WITH ENDOBRONCHIAL ULTRASOUND;  Surgeon: Melrose Nakayama, MD;  Location: [facility];  Service: Thoracic;  Laterality: N/A;    OB History    No data available       Home Medications    Prior to Admission medications   Medication Sig Start Date End Date Taking? Authorizing Provider  albuterol (PROAIR HFA) 108 (90 BASE) MCG/ACT inhaler Inhale 2 puffs into the lungs every 4 (four) hours as needed for wheezing or shortness of breath.    [provider]  alendronate (FOSAMAX) 70 MG tablet Take 70 mg by mouth every Friday.     [provider]  aspirin EC 81 MG tablet Take 81 mg by mouth daily.    [provider]  Atezolizumab (TECENTRIQ IV) Inject into the vein. Every 3 weeks    [provider]  atorvastatin (LIPITOR) 40 MG tablet Take 40 mg by mouth at bedtime.     [provider]  buPROPion (WELLBUTRIN SR) 150 MG 12 hr tablet Take 150 mg by mouth 2 (two) times daily.    [provider]  cholecalciferol (VITAMIN D) 1000 units tablet Take 1,000 Units by mouth daily.    [provider]  doxycycline (VIBRAMYCIN) 100 MG capsule Take 1 capsule  (100 mg total) by mouth 2 (two) times daily. 06/23/17   Orpah Greek, MD  furosemide (LASIX) 40 MG tablet Take 40 mg by mouth daily. Pt is able to take one additional tablet daily for weight increases of 3lbs in a day or 5lbs in a week.    [provider]  gabapentin (NEURONTIN) 300 MG capsule Take 600 mg by mouth at bedtime.     [provider]  Garlic 409 MG TABS Take 300 mg by mouth daily.    [provider]  ipratropium-albuterol (DUONEB) 0.5-2.5 (3) MG/3ML SOLN Inhale 3 mLs into the lungs every 6 (six) hours as needed (for wheezing/shortness of breath).     [provider]  LANTUS SOLOSTAR 100 UNIT/ML Solostar Pen Inject 15 Units into the skin at bedtime.     [provider]  lidocaine-prilocaine (EMLA) cream Apply to affected area once 05/19/17   Brunetta Genera, MD  linagliptin (TRADJENTA) 5 MG TABS  tablet Take 5 mg by mouth daily.    [provider]  loratadine (CLARITIN) 10 MG tablet Take 10 mg by mouth daily.    [provider]  losartan (COZAAR) 50 MG tablet Take 50 mg by mouth daily.    [provider]  meclizine (ANTIVERT) 25 MG tablet Take 25 mg by mouth daily.     [provider]  metoprolol succinate (TOPROL-XL) 25 MG 24 hr tablet Take 1 tablet (25 mg total) by mouth daily. 06/26/15   Jettie Booze, MD  mupirocin cream (BACTROBAN) 2 % Apply 1 application topically 2 (two) times daily. 05/15/17   Evalee Jefferson, PA-C  nitroGLYCERIN (NITROSTAT) 0.4 MG SL tablet Place 0.4 mg under the tongue every 5 (five) minutes as needed for chest pain.    [provider]  Omega-3 Fatty Acids (FISH OIL PO) Take 1 capsule by mouth daily.    [provider]  omeprazole (PRILOSEC) 40 MG capsule Take 40 mg by mouth daily.    [provider]  ondansetron (ZOFRAN) 8 MG tablet Take 1 tablet (8 mg total) by mouth 2 (two) times daily as needed (Nausea or vomiting). 05/19/17   Brunetta Genera, MD  polyethylene glycol powder [facility]) powder Take 1 capful daily as needed. 06/01/17   Twana First, MD  potassium chloride (K-DUR) 10 MEQ tablet Take 1 tablet (10 mEq total) by mouth daily. 05/21/16   Isaiah Serge, NP  potassium chloride (K-DUR) 10 MEQ tablet Take 1 tablet (10 mEq total) by mouth daily. *Further refills need to be authorized by PCP* 05/15/17 08/13/17  Isaiah Serge, NP  predniSONE (DELTASONE) 20 MG tablet Take 2 tablets (40 mg total) by mouth daily with breakfast. 06/23/17   Finley Chevez, Gwenyth Allegra, MD  prochlorperazine (COMPAZINE) 10 MG tablet Take 1 tablet (10 mg total) by mouth every 6 (six) hours as needed (Nausea or vomiting). 05/19/17   Brunetta Genera, MD  pyridoxine (B-6) 100 MG tablet Take 100 mg by mouth daily.     [provider]  ropinirole (REQUIP) 5 MG tablet Take 5 mg by mouth 3 (three) times daily.    [provider]  sodium chloride (OCEAN) 0.65 % SOLN nasal spray Place 1 spray into both nostrils every 3 (three) hours as needed for congestion.     [provider]  traMADol (ULTRAM) 50 MG tablet Take 50 mg by mouth every 12 (twelve) hours as needed for moderate pain.     [provider]  zolpidem (AMBIEN) 5 MG tablet Take 5 mg by mouth at bedtime as needed for sleep.    [provider]    Family History Family History  Problem Relation Age of Onset  . Diabetes Mother   . Hypertension Mother   . Diabetes Father   . Asthma Unknown   . Cancer Unknown   . Heart attack Sister        X2  . Colon cancer Neg Hx     Social History Social History  Substance Use Topics  . Smoking status: Former Smoker    Years: 20.00    Types: Cigarettes    Quit date: 08/13/2006  . Smokeless tobacco: Never Used  . Alcohol use No     Allergies   Lisinopril   Review of Systems Review of Systems  Constitutional: Negative for fever.  HENT: Negative for rhinorrhea and sore throat.   Respiratory: Positive  for cough, sputum production, shortness of breath and wheezing.  Cardiovascular: Negative for chest pain.  All other systems reviewed and are negative.    Physical Exam Updated Vital Signs BP (!) 128/42   Pulse 78   Temp 98.1 F (36.7 C) (Oral)   Resp 17   Ht 5\' 4"  (1.626 m)   Wt 80.7 kg (178 lb)   SpO2 99%   BMI 30.55 kg/m   Physical Exam  Constitutional: She is oriented to person, place, and time. She appears well-developed and well-nourished. No distress.  HENT:  Head: Normocephalic and atraumatic.  Right Ear: Hearing normal.  Left Ear: Hearing normal.  Nose: Nose normal.  Mouth/Throat: Oropharynx is clear and moist and mucous membranes are normal.  Eyes: Pupils are equal, round, and reactive to light. Conjunctivae and EOM are normal.  Neck: Normal range of motion. Neck supple.  Cardiovascular: Regular rhythm, S1 normal and S2 normal.  Exam reveals no gallop and no friction rub.   No murmur heard. Pulmonary/Chest: Effort normal. No respiratory distress. She has decreased breath sounds. She exhibits no tenderness.  Abdominal: Soft. Normal appearance and bowel sounds are normal. There is no hepatosplenomegaly. There is no tenderness. There is no rebound, no guarding, no tenderness at McBurney's point and negative Murphy's sign. No hernia.  Musculoskeletal: Normal range of motion.  Neurological: She is alert and oriented to person, place, and time. She has normal strength. No cranial nerve deficit or sensory deficit. Coordination normal. GCS eye subscore is 4. GCS verbal subscore is 5. GCS motor subscore is 6.  Skin: Skin is warm, dry and intact. No rash noted. No cyanosis.  Psychiatric: She has a normal mood and affect. Her speech is normal and behavior is normal. Thought content normal.  Nursing note and vitals reviewed.    ED Treatments / Results  Labs (all labs ordered are listed, but only abnormal results are displayed) Labs Reviewed  BASIC METABOLIC PANEL -  Abnormal; Notable for the following:       Result Value   Chloride 99 (*)    Glucose, Bld 175 (*)    BUN 21 (*)    Creatinine, Ser 1.68 (*)    GFR calc non Af Amer 27 (*)    GFR calc Af Amer 32 (*)    All other components within normal limits  CBC WITH DIFFERENTIAL/PLATELET  BRAIN NATRIURETIC PEPTIDE  I-STAT TROPOININ, ED    EKG  EKG Interpretation  Date/Time:  Monday June 22 2017 22:34:42 EDT Ventricular Rate:  87 PR Interval:    QRS Duration: 82 QT Interval:  369 QTC Calculation: 444 R Axis:   28 Text Interpretation:  Sinus rhythm Anteroseptal infarct, age indeterminate No significant change since last tracing Confirmed by Orpah Greek [phone]) on 06/22/2017 11:30:11 PM       Radiology Dg Chest 2 View  Result Date: 06/22/2017 CLINICAL DATA:  Worsening dyspnea since Thursday with productive cough. EXAM: CHEST  2 VIEW COMPARISON:  04/26/2017. FINDINGS: Chronic elevation and lateralization of the left hemidiaphragm with chronic deformity of multiple left-sided ribs. The heart is top-normal in size with aortic atherosclerosis. Chronic mild bilateral interstitial prominence without pneumonic consolidation, pneumothorax nor overt pulmonary edema. Degenerative changes are seen about both shoulders and dorsal spine. Port catheter tip in the distal SVC. IMPRESSION: Chronic interstitial change of the lungs without pneumonic consolidation or CHF. Aortic atherosclerosis. Chronic blunting and elevation of the left hemidiaphragm with adjacent chronic appearing deformity of multiple left-sided ribs. Electronically Signed   By: Ashley Royalty M.D.   On:  06/22/2017 23:36    Procedures Procedures (including critical care time)  Medications Ordered in ED Medications  albuterol (PROVENTIL) (2.5 MG/3ML) 0.083% nebulizer solution 5 mg (5 mg Nebulization Given 06/22/17 2248)  ipratropium-albuterol (DUONEB) 0.5-2.5 (3) MG/3ML nebulizer solution 3 mL (3 mLs Nebulization Given 06/22/17 2347)    albuterol (PROVENTIL) (2.5 MG/3ML) 0.083% nebulizer solution 2.5 mg (2.5 mg Nebulization Given 06/22/17 2347)     Initial Impression / Assessment and Plan / ED Course  I have reviewed the triage vital signs and the nursing notes.  Pertinent labs & imaging results that were available during my care of the patient were reviewed by me and considered in my medical decision making (see chart for details).     Patient with history of COPD and CHF presents to the ER for shortness of breath. She is not expressing any chest pain. She has a normal troponin and unchanged EKG. She does have a stated history of CHF, but does not clinically appear to be experiencing CHF exacerbation and her BNP is 45. Chest x-ray does not show any evidence of edema. She also does not have any evidence of pneumonia. Patient progressively improving with bronchodilator therapy here in the ER. She is not experiencing any hypoxia. She will be appropriate for discharge, treatment with antibiotics, corticosteroids and continue bronchodilator therapy.  Final Clinical Impressions(s) / ED Diagnoses   Final diagnoses:  COPD exacerbation (HCC)    New Prescriptions New Prescriptions   DOXYCYCLINE (VIBRAMYCIN) 100 MG CAPSULE    Take 1 capsule (100 mg total) by mouth 2 (two) times daily.   PREDNISONE (DELTASONE) 20 MG TABLET    Take 2 tablets (40 mg total) by mouth daily with breakfast.     Orpah Greek, MD 06/23/17 [phone]

## 2017-06-22 NOTE — ED Triage Notes (Signed)
Reports of shortness of breath since Thursday with productive cough. Also reports of chest tightness. Patient on 3 LPM of home oxygen.

## 2017-06-23 LAB — I-STAT TROPONIN, ED: Troponin i, poc: 0 ng/mL (ref 0.00–0.08)

## 2017-06-23 MED ORDER — PREDNISONE 20 MG PO TABS: 40.0000 mg | ORAL_TABLET | Freq: Every day | ORAL | 0 refills | Status: DC

## 2017-06-23 MED ORDER — DOXYCYCLINE HYCLATE 100 MG PO CAPS: 100.0000 mg | ORAL_CAPSULE | Freq: Two times a day (BID) | ORAL | 0 refills | Status: DC

## 2017-06-24 DIAGNOSIS — Z0000 Encounter for general adult medical examination without abnormal findings: Secondary | ICD-10-CM | POA: Diagnosis not present

## 2017-06-24 DIAGNOSIS — J9611 Chronic respiratory failure with hypoxia: Secondary | ICD-10-CM | POA: Diagnosis not present

## 2017-06-24 DIAGNOSIS — N390 Urinary tract infection, site not specified: Secondary | ICD-10-CM | POA: Diagnosis not present

## 2017-06-24 DIAGNOSIS — Z1389 Encounter for screening for other disorder: Secondary | ICD-10-CM | POA: Diagnosis not present

## 2017-06-24 DIAGNOSIS — C3490 Malignant neoplasm of unspecified part of unspecified bronchus or lung: Secondary | ICD-10-CM | POA: Diagnosis not present

## 2017-06-24 DIAGNOSIS — I10 Essential (primary) hypertension: Secondary | ICD-10-CM | POA: Diagnosis not present

## 2017-06-24 DIAGNOSIS — J449 Chronic obstructive pulmonary disease, unspecified: Secondary | ICD-10-CM | POA: Diagnosis not present

## 2017-06-24 DIAGNOSIS — E119 Type 2 diabetes mellitus without complications: Secondary | ICD-10-CM | POA: Diagnosis not present

## 2017-06-25 ENCOUNTER — Ambulatory Visit (HOSPITAL_COMMUNITY)
Admission: RE | Admit: 2017-06-25 | Discharge: 2017-06-25 | Disposition: A | Discharge status: Home / Self Care | Payer: Medicare Other | Source: Ambulatory Visit | Attending: Cardiology | Admitting: Cardiology

## 2017-06-25 DIAGNOSIS — L97529 Non-pressure chronic ulcer of other part of left foot with unspecified severity: Secondary | ICD-10-CM | POA: Diagnosis not present

## 2017-06-25 DIAGNOSIS — I739 Peripheral vascular disease, unspecified: Secondary | ICD-10-CM | POA: Diagnosis not present

## 2017-06-30 ENCOUNTER — Telehealth: Payer: Self-pay | Admitting: Cardiovascular Disease

## 2017-06-30 NOTE — Telephone Encounter (Signed)
New message    This patient was scheduled with Melina Copa on 7/25. I was asked to move her to Dr. Gwenlyn Found because it's a PV consult. I rescheduled her to August 22nd with Dr. Gwenlyn Found, patient's daughter seemed ok with this appointment. Is it ok for patient to wait until then? Rico Junker. Asked me to make sure with Dr. Gwenlyn Found.

## 2017-06-30 NOTE — Telephone Encounter (Signed)
Spoke with primary, ok to move up appt and add to schedule on Tuesday 7/31 with Dr. Gwenlyn Found.  appt scheduled for 7/31 at 10 AM   Called and left message to call back to notify.

## 2017-07-01 ENCOUNTER — Ambulatory Visit: Payer: Medicare Other | Admitting: Physician Assistant

## 2017-07-01 ENCOUNTER — Other Ambulatory Visit (INDEPENDENT_AMBULATORY_CARE_PROVIDER_SITE_OTHER): Payer: Self-pay

## 2017-07-01 DIAGNOSIS — L97524 Non-pressure chronic ulcer of other part of left foot with necrosis of bone: Secondary | ICD-10-CM

## 2017-07-03 NOTE — Telephone Encounter (Signed)
Daughter aware of appt and verbalized understanding.  Aware to arrive 15 mins prior.

## 2017-07-07 ENCOUNTER — Encounter: Payer: Self-pay | Admitting: Cardiovascular Disease

## 2017-07-07 ENCOUNTER — Ambulatory Visit (INDEPENDENT_AMBULATORY_CARE_PROVIDER_SITE_OTHER): Payer: Medicare Other | Admitting: Cardiovascular Disease

## 2017-07-07 DIAGNOSIS — I998 Other disorder of circulatory system: Secondary | ICD-10-CM

## 2017-07-07 DIAGNOSIS — I70229 Atherosclerosis of native arteries of extremities with rest pain, unspecified extremity: Secondary | ICD-10-CM

## 2017-07-07 NOTE — Progress Notes (Signed)
07/07/2017 Angela Lawson   January 26, 1934  [id number]  Primary Physician Rosita Fire, MD Primary Cardiologist: Lorretta Harp MD Lupe Carney, [place]  HPI:  Angela Lawson is a 83 y.o. female  accompanied by her daughter-in-law today. She was referred by Dr. Irish Lack for peripheral arterial disease/critical limb ischemia. She has a history of treated hypertension, hyperlipidemia and diabetes. She has had stress-induced cardiomyopathy with essentially normal. She has moderate renal insufficiency followed by Dr. Henderson Cloud with creatinines running in the 1.7 range. She had her left toenail removed by Dr. Barkley Bruns 03/11/17 which was slow to heal. Currently the toenail has healed. Her Dopplers performed 06/25/17 ordered by Dr. Sharol Given revealed a right ABI of 1.27 and a left ABI of 0.75 with an occluded left popliteal artery. The patient denies claudication. She is minimally ambulatory and is limited by her COPD.   Current Meds  Medication Sig  . albuterol (PROAIR HFA) 108 (90 BASE) MCG/ACT inhaler Inhale 2 puffs into the lungs every 4 (four) hours as needed for wheezing or shortness of breath.  Marland Kitchen alendronate (FOSAMAX) 70 MG tablet Take 70 mg by mouth every Friday.   Marland Kitchen aspirin EC 81 MG tablet Take 81 mg by mouth daily.  Huey Bienenstock (TECENTRIQ IV) Inject into the vein. Every 3 weeks  . atorvastatin (LIPITOR) 40 MG tablet Take 40 mg by mouth at bedtime.   Marland Kitchen buPROPion (WELLBUTRIN SR) 150 MG 12 hr tablet Take 150 mg by mouth 2 (two) times daily.  . cholecalciferol (VITAMIN D) 1000 units tablet Take 1,000 Units by mouth daily.  Marland Kitchen doxycycline (VIBRAMYCIN) 100 MG capsule Take 1 capsule (100 mg total) by mouth 2 (two) times daily.  . furosemide (LASIX) 40 MG tablet Take 40 mg by mouth daily. Pt is able to take one additional tablet daily for weight increases of 3lbs in a day or 5lbs in a week.  . gabapentin (NEURONTIN) 300 MG capsule Take 600 mg by mouth at bedtime.   . Garlic 938 MG TABS Take 300 mg by mouth  daily.  Marland Kitchen ipratropium-albuterol (DUONEB) 0.5-2.5 (3) MG/3ML SOLN Inhale 3 mLs into the lungs every 6 (six) hours as needed (for wheezing/shortness of breath).   Marland Kitchen LANTUS SOLOSTAR 100 UNIT/ML Solostar Pen Inject 15 Units into the skin at bedtime.   . lidocaine-prilocaine (EMLA) cream Apply to affected area once  . linagliptin (TRADJENTA) 5 MG TABS tablet Take 5 mg by mouth daily.  Marland Kitchen loratadine (CLARITIN) 10 MG tablet Take 10 mg by mouth daily.  Marland Kitchen losartan (COZAAR) 50 MG tablet Take 50 mg by mouth daily.  . meclizine (ANTIVERT) 25 MG tablet Take 25 mg by mouth daily.   . metoprolol succinate (TOPROL-XL) 25 MG 24 hr tablet Take 1 tablet (25 mg total) by mouth daily.  . mupirocin cream (BACTROBAN) 2 % Apply 1 application topically 2 (two) times daily.  . nitroGLYCERIN (NITROSTAT) 0.4 MG SL tablet Place 0.4 mg under the tongue every 5 (five) minutes as needed for chest pain.  . Omega-3 Fatty Acids (FISH OIL PO) Take 1 capsule by mouth daily.  Marland Kitchen omeprazole (PRILOSEC) 40 MG capsule Take 40 mg by mouth daily.  . ondansetron (ZOFRAN) 8 MG tablet Take 1 tablet (8 mg total) by mouth 2 (two) times daily as needed (Nausea or vomiting).  . polyethylene glycol powder (GLYCOLAX/MIRALAX) powder Take 1 capful daily as needed.  . potassium chloride (K-DUR) 10 MEQ tablet Take 1 tablet (10 mEq total) by mouth daily.  Marland Kitchen  potassium chloride (K-DUR) 10 MEQ tablet Take 1 tablet (10 mEq total) by mouth daily. *Further refills need to be authorized by PCP*  . prochlorperazine (COMPAZINE) 10 MG tablet Take 1 tablet (10 mg total) by mouth every 6 (six) hours as needed (Nausea or vomiting).  . pyridoxine (B-6) 100 MG tablet Take 100 mg by mouth daily.   . ropinirole (REQUIP) 5 MG tablet Take 5 mg by mouth 3 (three) times daily.  . sodium chloride (OCEAN) 0.65 % SOLN nasal spray Place 1 spray into both nostrils every 3 (three) hours as needed for congestion.   . traMADol (ULTRAM) 50 MG tablet Take 50 mg by mouth every 12  (twelve) hours as needed for moderate pain.   Marland Kitchen zolpidem (AMBIEN) 5 MG tablet Take 5 mg by mouth at bedtime as needed for sleep.     Allergies  Allergen Reactions  . Lisinopril Cough    Social History   Social History  . Marital status: Widowed    Spouse name: N/A  . Number of children: N/A  . Years of education: N/A   Occupational History  . Not on file.   Social History Main Topics  . Smoking status: Former Smoker    Years: 20.00    Types: Cigarettes    Quit date: 08/13/2006  . Smokeless tobacco: Never Used  . Alcohol use No  . Drug use: No  . Sexual activity: Not Currently    Birth control/ protection: Surgical   Other Topics Concern  . Not on file   Social History Narrative  . No narrative on file     Review of Systems: General: negative for chills, fever, night sweats or weight changes.  Cardiovascular: negative for chest pain, dyspnea on exertion, edema, orthopnea, palpitations, paroxysmal nocturnal dyspnea or shortness of breath Dermatological: negative for rash Respiratory: negative for cough or wheezing Urologic: negative for hematuria Abdominal: negative for nausea, vomiting, diarrhea, bright red blood per rectum, melena, or hematemesis Neurologic: negative for visual changes, syncope, or dizziness All other systems reviewed and are otherwise negative except as noted above.    Blood pressure (!) 105/54, pulse 69, height 5\' 4"  (1.626 m), weight 175 lb (79.4 kg).  General appearance: alert and no distress Neck: no adenopathy, no carotid bruit, no JVD, supple, symmetrical, trachea midline and thyroid not enlarged, symmetric, no tenderness/mass/nodules Lungs: clear to auscultation bilaterally Heart: regular rate and rhythm, S1, S2 normal, no murmur, click, rub or gallop Extremities: Healed left great toenail bed.  EKG not performed today  ASSESSMENT AND PLAN:   Critical lower limb ischemia [name] was referred by Dr. Irish Lack renice for evaluation  and treatment of critical limb ischemia. She apparently had her left great toenail removed by Dr. Thalia Party. This apparently healed slowly but is currently healed. She expresses no pain. She is essentially wheelchair-bound because of severe limiting COPD that is oxygen dependent. Dopplers performed in our office 06/25/17 revealed a right ABI of 1.27 and a left of 0.75 with occluded left popliteal artery. She does have moderate renal insufficiency with creatinines in the 1.7 range. At this point, given the fact that her toenail has healed I do not feel that an aggressive interventional approach is appropriate. I will see her back as needed.      Lorretta Harp MD FACP,FACC,FAHA, [facility] 07/07/2017 10:45 AM

## 2017-07-07 NOTE — Assessment & Plan Note (Signed)
Angela Lawson was referred by Dr. Irish Lack renice for evaluation and treatment of critical limb ischemia. She apparently had her left great toenail removed by Dr. Thalia Party. This apparently healed slowly but is currently healed. She expresses no pain. She is essentially wheelchair-bound because of severe limiting COPD that is oxygen dependent. Dopplers performed in our office 06/25/17 revealed a right ABI of 1.27 and a left of 0.75 with occluded left popliteal artery. She does have moderate renal insufficiency with creatinines in the 1.7 range. At this point, given the fact that her toenail has healed I do not feel that an aggressive interventional approach is appropriate. I will see her back as needed.

## 2017-07-07 NOTE — Patient Instructions (Signed)
Medication Instructions: Your physician recommends that you continue on your current medications as directed. Please refer to the Current Medication list given to you today.   Follow-Up: Your physician recommends that you schedule a follow-up appointment as needed with Dr. Berry.    

## 2017-07-09 ENCOUNTER — Encounter (HOSPITAL_COMMUNITY): Payer: Medicare Other | Attending: Hematology & Oncology

## 2017-07-09 ENCOUNTER — Encounter (HOSPITAL_BASED_OUTPATIENT_CLINIC_OR_DEPARTMENT_OTHER): Payer: Medicare Other | Admitting: Oncology

## 2017-07-09 VITALS — BP 134/49 | HR 76 | Temp 98.5°F | Resp 18 | Wt 180.4 lb

## 2017-07-09 DIAGNOSIS — C3431 Malignant neoplasm of lower lobe, right bronchus or lung: Secondary | ICD-10-CM

## 2017-07-09 DIAGNOSIS — C3490 Malignant neoplasm of unspecified part of unspecified bronchus or lung: Secondary | ICD-10-CM

## 2017-07-09 DIAGNOSIS — Z5112 Encounter for antineoplastic immunotherapy: Secondary | ICD-10-CM

## 2017-07-09 DIAGNOSIS — C3492 Malignant neoplasm of unspecified part of left bronchus or lung: Secondary | ICD-10-CM | POA: Diagnosis present

## 2017-07-09 DIAGNOSIS — R109 Unspecified abdominal pain: Secondary | ICD-10-CM | POA: Diagnosis not present

## 2017-07-09 DIAGNOSIS — C3412 Malignant neoplasm of upper lobe, left bronchus or lung: Secondary | ICD-10-CM | POA: Diagnosis not present

## 2017-07-09 DIAGNOSIS — Z7189 Other specified counseling: Secondary | ICD-10-CM

## 2017-07-09 LAB — COMPREHENSIVE METABOLIC PANEL
ALT: 14 U/L (ref 14–54)
AST: 21 U/L (ref 15–41)
Albumin: 3.3 g/dL — ABNORMAL LOW (ref 3.5–5.0)
Alkaline Phosphatase: 46 U/L (ref 38–126)
Anion gap: 8 (ref 5–15)
BUN: 15 mg/dL (ref 6–20)
CO2: 28 mmol/L (ref 22–32)
Calcium: 9 mg/dL (ref 8.9–10.3)
Chloride: 102 mmol/L (ref 101–111)
Creatinine, Ser: 1.17 mg/dL — ABNORMAL HIGH (ref 0.44–1.00)
GFR calc Af Amer: 49 mL/min — ABNORMAL LOW (ref >60)
GFR calc non Af Amer: 43 mL/min — ABNORMAL LOW (ref >60)
Glucose, Bld: 134 mg/dL — ABNORMAL HIGH (ref 65–99)
Potassium: 4.6 mmol/L (ref 3.5–5.1)
Sodium: 138 mmol/L (ref 135–145)
Total Bilirubin: 0.5 mg/dL (ref 0.3–1.2)
Total Protein: 6.7 g/dL (ref 6.5–8.1)

## 2017-07-09 LAB — CBC WITH DIFFERENTIAL/PLATELET
Basophils Absolute: 0 10*3/uL (ref 0.0–0.1)
Basophils Relative: 0 %
Eosinophils Absolute: 0.3 10*3/uL (ref 0.0–0.7)
Eosinophils Relative: 3 %
HCT: 37.2 % (ref 36.0–46.0)
Hemoglobin: 11.5 g/dL — ABNORMAL LOW (ref 12.0–15.0)
Lymphocytes Relative: 19 %
Lymphs Abs: 1.6 10*3/uL (ref 0.7–4.0)
MCH: 29.6 pg (ref 26.0–34.0)
MCHC: 30.9 g/dL (ref 30.0–36.0)
MCV: 95.9 fL (ref 78.0–100.0)
Monocytes Absolute: 0.8 10*3/uL (ref 0.1–1.0)
Monocytes Relative: 10 %
Neutro Abs: 5.4 10*3/uL (ref 1.7–7.7)
Neutrophils Relative %: 68 %
Platelets: 164 10*3/uL (ref 150–400)
RBC: 3.88 MIL/uL (ref 3.87–5.11)
RDW: 14.6 % (ref 11.5–15.5)
WBC: 8.1 10*3/uL (ref 4.0–10.5)

## 2017-07-09 LAB — TSH: TSH: 0.941 u[IU]/mL (ref 0.350–4.500)

## 2017-07-09 MED ORDER — SODIUM CHLORIDE 0.9 % IV SOLN
Freq: Once | INTRAVENOUS | Status: AC
  Administered 2017-07-09: 13:00:00 via INTRAVENOUS

## 2017-07-09 MED ORDER — SODIUM CHLORIDE 0.9 % IV SOLN
1200.0000 mg | Freq: Once | INTRAVENOUS | Status: AC
  Administered 2017-07-09: 1200 mg via INTRAVENOUS
  Filled 2017-07-09: qty 20

## 2017-07-09 MED ORDER — HEPARIN SOD (PORK) LOCK FLUSH 100 UNIT/ML IV SOLN
500.0000 [IU] | Freq: Once | INTRAVENOUS | Status: AC | PRN
  Administered 2017-07-09: 500 [IU]

## 2017-07-09 NOTE — Progress Notes (Signed)
[address] [place], [place] [address]   CLINIC:  Medical Oncology/Hematology  PCP:  Rosita Fire, MD [place] [address] [phone]   REASON FOR VISIT:  New/recurrent adenocarcinoma of the left and right lung; h/o Stage IA NSCLC s/p resection in 2009.   CURRENT THERAPY: Initial work-up    BRIEF ONCOLOGIC HISTORY:  Oncology History   stage I A., non-small cell lung cancer, consistent with a poorly differentiated adenocarcinoma, status post resection 07/07/2008 in [place].       Adenocarcinoma of lung ([place])   07/07/2008 Surgery    Performed in [place], [place].  Consistent with poorly differentiated adenocarcinoma.      02/27/2014 Imaging    CT of chest demonstrating a RLL solid nodular component measuring 9 mm at the site of previous ground-glass opacity. Slight increase in size of an anterior mediastinal node measuring 1.1 cm with stable 1 cm pretracheal lymph node. Consider PET      03/20/2014 Imaging    PET scan- No hypermetabolic pulmonary lesions to suggest recurrent or metastatic pulmonary disease.  Weakly positive left internal mammary lymph node and left axillary lymph node (? inflammatory process involving the left breast).      11/07/2015 Imaging    CT chest- 1. No evidence of metastatic involvement of the lungs. The previously noted small noncalcified lung nodules are stable. 2. Vague focal opacity in the right lower lobe may represent scarring and is stable. Continued followup however is recommended in 1 year.      01/08/2017 Imaging    CT chest- 1. New nodules along a thick walled bullous lesion in the right lower lobe, suspicious for malignancy. 2. New fairly large region of confluent ground-glass opacity posteriorly in the remaining left lung, low grade adenocarcinoma is not excluded and there is mild new nodularity along the upper margin of this process. 3. Stable biapical pleuroparenchymal  scarring with several stable nodules in the left lung. 4. Borderline adenopathy in the mediastinum, essentially stable from prior. 5. Coronary, aortic arch, and branch vessel atherosclerotic vascular disease. 6. Small hiatal hernia. 7. Hypodense left thyroid lesion. If not previously worked up, thyroid ultrasound could be employed.      01/21/2017 PET scan    1. New hypermetabolic adenopathy in the mediastinum and hila, with progressive hypermetabolic ground-glass density posteriorly in the left lower lobe with faint nodular components, and some low-grade hypermetabolic activity in ground-glass opacity dependently in the right upper lobe. Although the appearance is concerning for malignancy potentially with a lymphangitic component in the left lower lobe, the unusual configuration in the appearance of the ground-glass opacities could conceivably represent an atypical granulomatous infectious process as an alternative. This is not classic obvious recurrence with well-defined mass or nodules. 2. There is some ill-defined clustered nodularity in the right lower lobe only faintly metabolic, maximum SUV 3.0. 3. Overall I believe that this process require surveillance to differentiate an inflammatory/infectious condition from recurrent malignancy. The adenopathy is certainly concerning. 4. No findings of malignancy in the neck, abdomen/pelvis, or skeleton. 5. Sigmoid colon diverticulosis. 6. Emphysema. 7. Chronic ethmoid sinusitis.      03/25/2017 Imaging    CT chest- Stable 5.9 cm left lower lobe ground-glass opacity, hypermetabolic on prior PET, worrisome for primary bronchogenic neoplasm such as low-grade adenocarcinoma.  Stable 2.2 cm cavitary lesion in the central right lower lobe with surrounding hypermetabolic nodularity measuring up to 1.8 cm, worrisome for primary bronchogenic neoplasm such as  squamous cell carcinoma.  Hypermetabolic mediastinal lymph nodes measuring up  to 11 mm short axis, as above. Bilateral hilar regions are hypermetabolic on PET but poorly evaluated on unenhanced CT.  Emphysema and aortic atherosclerosis.      04/23/2017 Procedure    Bronchoscopy and EBUS with mediastinal lymph node aspirations by Dr. Roxan Hockey.      04/26/2017 Pathology Results    1. Lung, biopsy, Left upper lobe - ADENOCARCINOMA. - SEE COMMENT. 2. Lung, biopsy, Right lower lobe - ADENOCARCINOMA. - SEE COMMENT. 3. Lung, biopsy, Right lower lobe target 2 - ATYPICAL CELLS. - SEE COMMENT.      04/26/2017 Relapse/Recurrence         05/14/2017 Pathology Results    PDL1 NEGATIVE.  TPS 0%.      05/19/2017 Pathology Results    [employer] testing- MS-stable.  TMB 6 Muts/Mb (Intermediate).  STK11 E293*.  Negative for EGFR, KRAS, ALK, BRAF, MET, RET, ERBB2, and ROS1.  Potential treatment options: Atezolizumab/Durvalumab/Nivolumab/Pembrolizumab for TMB score.        HISTORY OF PRESENT ILLNESS:  (From Kirby Crigler, PA-C's last note on 12/05/16)   On 01/07/17, she had CT chest for surveillance of her lung cancer; new nodules were noted and concerning for malignancy.  She was recently admitted on 01/10/17-01/13/17 for COPD exacerbation; she was discharged on oral steroids at that time.  On 01/21/17, she underwent PET scan revealing new hypermetabolic adenopathy in the mediastinum and hilum, with progressive hypermetabolic groundglass density in the left lower lobe; there is mention of possible concern of malignancy with a lymphangitic component in the left lower lobe, but this may also represent an atypical granulomatous infectious process as well; there were no findings of malignancy in the neck, abdomen/pelvis, or skeleton. It was recommended by cardiothoracic surgery that we repeat imaging in 2-3 months after hospitalization to re-assess lesions noted to CT and PET scans.     INTERVAL HISTORY: Patient presents for follow-up today with her daughter. Patient is to get  cycle 3 of tecentriq today. She has been tolerating her treatments very well without any side effects. She denies any fatigue, chest pain, shortness of breath above her baseline, abdominal pain, focal weakness. Appetite is good. No weight loss. No diarrhea.   REVIEW OF SYSTEMS:  Review of Systems  Constitutional: Negative for appetite change, chills, fatigue and fever.  HENT:   Negative for hearing loss, lump/mass, mouth sores, sore throat, tinnitus and trouble swallowing.   Eyes: Negative.  Negative for eye problems and icterus.  Respiratory: Positive for shortness of breath (at baseline). Negative for chest tightness, cough, hemoptysis and wheezing.   Cardiovascular: Negative.  Negative for chest pain, leg swelling and palpitations.  Gastrointestinal: Negative.  Negative for abdominal distention, abdominal pain, blood in stool, constipation, diarrhea, nausea and vomiting.  Endocrine: Negative.  Negative for hot flashes.  Genitourinary: Negative.  Negative for difficulty urinating, dysuria, frequency, hematuria and vaginal bleeding.   Musculoskeletal: Negative.  Negative for arthralgias and neck pain.  Skin: Negative.  Negative for itching and rash.  Neurological: Negative for dizziness, headaches and speech difficulty.  Hematological: Negative for adenopathy. Does not bruise/bleed easily.  Psychiatric/Behavioral: Negative.  Negative for confusion. The patient is not nervous/anxious.      PAST MEDICAL/SURGICAL HISTORY:  Past Medical History:  Diagnosis Date  . Adenocarcinoma of lung ([place])    Left lung 2009, resected  . Anginal pain ([place])   . Arthritis   . Back pain   . CHF (congestive heart failure) (  HCC)   . COPD (chronic obstructive pulmonary disease) (HCC)   . Diverticulitis   . Dyslipidemia   . Essential hypertension   . H/O ventral hernia   . Non-obstructive CAD    a. 04/2015 NSTEMI/Cath: LAD 10p, LCX 66m, RCA 84m, 20d, EF 35-40 w/ apical ballooning.  . On home O2    3L N/C   . Osteoporosis   . Osteoporosis 11/04/2015   Managed by Dr. Felecia Shelling   . Parkinson's disease (HCC)   . Shortness of breath   . Takotsubo cardiomyopathy    a. 04/2015 Echo: EF 45-50%, mid-dist anterior/apical/inferoapical HK w/ hyperdynamic base. Gr 1 DD, mild AI, mild-mod MR, triv TR, PASP ;  b. 04/2015 LV gram: Ef 35-40% w/ apical ballooning.  . Type II diabetes mellitus (HCC)   . Ventricular bigeminy    a. 04/2015 in setting of NSTEMI/Takotsubo.   Past Surgical History:  Procedure Laterality Date  . ABDOMINAL HYSTERECTOMY    . CARDIAC CATHETERIZATION N/A 04/17/2015   Procedure: Left Heart Cath and Coronary Angiography;  Surgeon: Lennette Bihari, MD;  Location[facility] INVASIVE CV LAB;  Service: Cardiovascular;  Laterality: N/A;  . CHOLECYSTECTOMY    . COLONOSCOPY N/A 09/18/2014   Procedure: COLONOSCOPY;  Surgeon: West Bali, MD;  Location: [facility] ENDO SUITE;  Service: Endoscopy;  Laterality: N/A;  8:30 AM - moved to 10:30 Durward Mallard to notify pt  . ECTOPIC PREGNANCY SURGERY    . INCISIONAL HERNIA REPAIR N/A 08/26/2013   Procedure: HERNIA REPAIR INCISIONAL WITH MESH;  Surgeon: Dalia Heading, MD;  Location: [facility] ORS;  Service: General;  Laterality: N/A;  . IR FLUORO GUIDE PORT INSERTION RIGHT  04/30/2017  . IR US GUIDE VASC ACCESS RIGHT  04/30/2017  . LUNG CANCER SURGERY    . VIDEO BRONCHOSCOPY WITH ENDOBRONCHIAL NAVIGATION N/A 04/23/2017   Procedure: VIDEO BRONCHOSCOPY WITH ENDOBRONCHIAL NAVIGATION;  Surgeon: Loreli Slot, MD;  Location[facility] OR;  Service: Thoracic;  Laterality: N/A;  . VIDEO BRONCHOSCOPY WITH ENDOBRONCHIAL ULTRASOUND N/A 04/23/2017   Procedure: VIDEO BRONCHOSCOPY WITH ENDOBRONCHIAL ULTRASOUND;  Surgeon: Loreli Slot, MD;  Location: [facility] OR;  Service: Thoracic;  Laterality: N/A;     SOCIAL HISTORY:  Social History   Social History  . Marital status: Widowed    Spouse name: N/A  . Number of children: N/A  . Years of education: N/A   Occupational History  .  Not on file.   Social History Main Topics  . Smoking status: Former Smoker    Years: 20.00    Types: Cigarettes    Quit date: 08/13/2006  . Smokeless tobacco: Never Used  . Alcohol use No  . Drug use: No  . Sexual activity: Not Currently    Birth control/ protection: Surgical   Other Topics Concern  . Not on file   Social History Narrative  . No narrative on file    FAMILY HISTORY:  Family History  Problem Relation Age of Onset  . Diabetes Mother   . Hypertension Mother   . Diabetes Father   . Asthma Unknown   . Cancer Unknown   . Heart attack Sister        X2  . Colon cancer Neg Hx     CURRENT MEDICATIONS:  Outpatient Encounter Prescriptions as of 07/09/2017  Medication Sig  . rOPINIRole (REQUIP) 0.5 MG tablet Take 0.5 mg by mouth 3 (three) times daily.  Marland Kitchen albuterol (PROAIR HFA) 108 (90 BASE) MCG/ACT inhaler Inhale 2 puffs into  the lungs every 4 (four) hours as needed for wheezing or shortness of breath.  Marland Kitchen alendronate (FOSAMAX) 70 MG tablet Take 70 mg by mouth every Friday.   Marland Kitchen aspirin EC 81 MG tablet Take 81 mg by mouth daily.  Huey Bienenstock (TECENTRIQ IV) Inject into the vein. Every 3 weeks  . atorvastatin (LIPITOR) 40 MG tablet Take 40 mg by mouth at bedtime.   Marland Kitchen buPROPion (WELLBUTRIN SR) 150 MG 12 hr tablet Take 150 mg by mouth 2 (two) times daily.  . cholecalciferol (VITAMIN D) 1000 units tablet Take 1,000 Units by mouth daily.  Marland Kitchen doxycycline (VIBRAMYCIN) 100 MG capsule Take 1 capsule (100 mg total) by mouth 2 (two) times daily.  . furosemide (LASIX) 40 MG tablet Take 40 mg by mouth daily. Pt is able to take one additional tablet daily for weight increases of 3lbs in a day or 5lbs in a week.  . gabapentin (NEURONTIN) 300 MG capsule Take 600 mg by mouth at bedtime.   . Garlic 425 MG TABS Take 300 mg by mouth daily.  Marland Kitchen ipratropium-albuterol (DUONEB) 0.5-2.5 (3) MG/3ML SOLN Inhale 3 mLs into the lungs every 6 (six) hours as needed (for wheezing/shortness of breath).     Marland Kitchen LANTUS SOLOSTAR 100 UNIT/ML Solostar Pen Inject 15 Units into the skin at bedtime.   . lidocaine-prilocaine (EMLA) cream Apply to affected area once  . linagliptin (TRADJENTA) 5 MG TABS tablet Take 5 mg by mouth daily.  Marland Kitchen loratadine (CLARITIN) 10 MG tablet Take 10 mg by mouth daily.  Marland Kitchen losartan (COZAAR) 50 MG tablet Take 50 mg by mouth daily.  . meclizine (ANTIVERT) 25 MG tablet Take 25 mg by mouth daily.   . metoprolol succinate (TOPROL-XL) 25 MG 24 hr tablet Take 1 tablet (25 mg total) by mouth daily.  . mupirocin cream (BACTROBAN) 2 % Apply 1 application topically 2 (two) times daily.  . nitroGLYCERIN (NITROSTAT) 0.4 MG SL tablet Place 0.4 mg under the tongue every 5 (five) minutes as needed for chest pain.  . Omega-3 Fatty Acids (FISH OIL PO) Take 1 capsule by mouth daily.  Marland Kitchen omeprazole (PRILOSEC) 40 MG capsule Take 40 mg by mouth daily.  . ondansetron (ZOFRAN) 8 MG tablet Take 1 tablet (8 mg total) by mouth 2 (two) times daily as needed (Nausea or vomiting).  . polyethylene glycol powder (GLYCOLAX/MIRALAX) powder Take 1 capful daily as needed.  . potassium chloride (K-DUR) 10 MEQ tablet Take 1 tablet (10 mEq total) by mouth daily.  . potassium chloride (K-DUR) 10 MEQ tablet Take 1 tablet (10 mEq total) by mouth daily. *Further refills need to be authorized by PCP*  . prochlorperazine (COMPAZINE) 10 MG tablet Take 1 tablet (10 mg total) by mouth every 6 (six) hours as needed (Nausea or vomiting).  . pyridoxine (B-6) 100 MG tablet Take 100 mg by mouth daily.   . sodium chloride (OCEAN) 0.65 % SOLN nasal spray Place 1 spray into both nostrils every 3 (three) hours as needed for congestion.   . traMADol (ULTRAM) 50 MG tablet Take 50 mg by mouth every 12 (twelve) hours as needed for moderate pain.   Marland Kitchen zolpidem (AMBIEN) 5 MG tablet Take 5 mg by mouth at bedtime as needed for sleep.  . [DISCONTINUED] ropinirole (REQUIP) 5 MG tablet Take 5 mg by mouth 3 (three) times daily.   No  facility-administered encounter medications on file as of 07/09/2017.     ALLERGIES:  Allergies  Allergen Reactions  . Lisinopril Cough  PHYSICAL EXAM:  ECOG Performance status: 2-3  There were no vitals filed for this visit.   Physical Exam  Constitutional: She is oriented to person, place, and time and well-developed, well-nourished, and in no distress. No distress.  oxygen dependent with portable oxygen Able to get onto the examining table  HENT:  Head: Normocephalic and atraumatic.  Mouth/Throat: Oropharynx is clear and moist. No oropharyngeal exudate.  Eyes: Pupils are equal, round, and reactive to light. Conjunctivae are normal. No scleral icterus.  Neck: Normal range of motion. Neck supple. No JVD present.  Cardiovascular: Normal rate, regular rhythm and normal heart sounds.  Exam reveals no gallop and no friction rub.   No murmur heard. Pulmonary/Chest: Effort normal. No respiratory distress. She has no wheezes. She has no rhonchi. She has no rales.  Bilateral expiratory wheezing  Abdominal: Soft. Bowel sounds are normal. She exhibits no distension. There is no tenderness. There is no rebound and no guarding.  Musculoskeletal: Normal range of motion. She exhibits no edema or tenderness.  Lymphadenopathy:    She has no cervical adenopathy.  Neurological: She is alert and oriented to person, place, and time. No cranial nerve deficit.  Skin: Skin is warm and dry. No rash noted. No erythema. No pallor.  Psychiatric: Mood, memory, affect and judgment normal.  Nursing note and vitals reviewed.    LABORATORY DATA:  I have reviewed the labs as listed.  CBC    Component Value Date/Time   WBC 8.1 07/09/2017 1141   RBC 3.88 07/09/2017 1141   HGB 11.5 (L) 07/09/2017 1141   HCT 37.2 07/09/2017 1141   PLT 164 07/09/2017 1141   MCV 95.9 07/09/2017 1141   MCH 29.6 07/09/2017 1141   MCHC 30.9 07/09/2017 1141   RDW 14.6 07/09/2017 1141   LYMPHSABS 1.6 07/09/2017 1141    MONOABS 0.8 07/09/2017 1141   EOSABS 0.3 07/09/2017 1141   BASOSABS 0.0 07/09/2017 1141   CMP Latest Ref Rng & Units 07/09/2017 06/22/2017 06/18/2017  Glucose 65 - 99 mg/dL 134(H) 175(H) 141(H)  BUN 6 - 20 mg/dL 15 21(H) 21(H)  Creatinine 0.44 - 1.00 mg/dL 1.17(H) 1.68(H) 1.63(H)  Sodium 135 - 145 mmol/L 138 138 135  Potassium 3.5 - 5.1 mmol/L 4.6 4.6 4.2  Chloride 101 - 111 mmol/L 102 99(L) 96(L)  CO2 22 - 32 mmol/L '28 30 31  '$ Calcium 8.9 - 10.3 mg/dL 9.0 9.9 10.1  Total Protein 6.5 - 8.1 g/dL 6.7 - 7.4  Total Bilirubin 0.3 - 1.2 mg/dL 0.5 - 0.8  Alkaline Phos 38 - 126 U/L 46 - 61  AST 15 - 41 U/L 21 - 24  ALT 14 - 54 U/L 14 - 20    PENDING LABS:    DIAGNOSTIC IMAGING:  PET scan: 01/21/17     CT chest: 03/25/17      PATHOLOGY:     ASSESSMENT & PLAN:  Cancer Staging Adenocarcinoma of lung ([place]) Staging form: Lung, AJCC 7th Edition - Clinical: Stage IA - Signed by Baird Cancer, PA-C on 02/27/2014 - Pathologic stage from 04/26/2017: Stage IV (T2b(2), N3, M1a) - Signed by Baird Cancer, PA-C on 04/26/2017   PLAN: -Labs reviewed. Proceed with cycle 3 as scheduled today. -Will order her restaging CT chest/abdomen/pelvis to be performed 1-2 days prior to her next visit. -Return to clinic for follow-up with cycle 4 of Tecentriq in 3 weeks.  All questions were answered to patient's stated satisfaction. Encouraged patient to call with any new concerns or  questions before her next visit to the cancer center and we can certain see her sooner, if needed.     Twana First, MD

## 2017-07-09 NOTE — Patient Instructions (Signed)
[place] at [facility] Discharge Instructions  RECOMMENDATIONS MADE BY THE CONSULTANT AND ANY TEST RESULTS WILL BE SENT TO YOUR REFERRING PHYSICIAN.    Thank you for choosing [place] at [facility] to provide your oncology and hematology care.  To afford each patient quality time with our provider, please arrive at least 15 minutes before your scheduled appointment time.    If you have a lab appointment with the [facility] please come in thru the  Main Entrance and check in at the main information desk  You need to re-schedule your appointment should you arrive 10 or more minutes late.  We strive to give you quality time with our providers, and arriving late affects you and other patients whose appointments are after yours.  Also, if you no show three or more times for appointments you may be dismissed from the clinic at the providers discretion.     Again, thank you for choosing [facility].  Our hope is that these requests will decrease the amount of time that you wait before being seen by our physicians.       _____________________________________________________________  Should you have questions after your visit to [facility], please contact our office at [phone] between the hours of 8:30 a.m. and 4:30 p.m.  Voicemails left after 4:30 p.m. will not be returned until the following business day.  For prescription refill requests, have your pharmacy contact our office.       Resources For Cancer Patients and their Caregivers ? American Cancer Society: Can assist with transportation, wigs, general needs, runs Look Good Feel Better.        719-608-6495 ? Cancer Care: Provides financial assistance, online support groups, medication/co-pay assistance.  1-800-813-HOPE (425)462-5406) ? [facility] Assists [place] cancer patients and their families through emotional , educational  and financial support.  832 844 1362 ? [place] DSS Where to apply for food stamps, Medicaid and utility assistance. (581)837-5808 ? [facility]: Transportation to medical appointments. 207-267-9824 ? Social Security Administration: May apply for disability if have a Stage IV cancer. 682 758 8800 (731) 571-1279 ? [employer], Disability and Transit Services: Assists with nutrition, care and transit needs. [place] Support Programs: @10RELATIVEDAYS @ > Cancer Support Group  2nd Tuesday of the month 1pm-2pm, Journey Room  > Creative Journey  3rd Tuesday of the month 1130am-1pm, Journey Room  > Look Good Feel Better  1st Wednesday of the month 10am-12 noon, Journey Room (Call [place] to register [phone])

## 2017-07-09 NOTE — Progress Notes (Signed)
Tolerated infusion w/o adverse reaction.  Alert, in no distress.  VSS.  Discharged via wheelchair in c/o family.  

## 2017-07-10 ENCOUNTER — Encounter: Payer: Self-pay | Admitting: Surgery

## 2017-07-10 ENCOUNTER — Telehealth: Payer: Self-pay | Admitting: Cardiovascular Disease

## 2017-07-10 NOTE — Telephone Encounter (Signed)
Patient daughter calling, states that she would like to know why patient was referred to Dr. Trula Slade.

## 2017-07-10 NOTE — Telephone Encounter (Signed)
I am not aware that I referred her to Dr. Trula Slade.

## 2017-07-10 NOTE — Telephone Encounter (Signed)
Returned call to patient's daughter Mickel Baas.She stated she wanted to know why Dr.Berry referred her mother to Dr.Brabham.Stated Dr.Brabham's office called this morning she has appointment Monday 8/6.Message sent to [facility] for advice.

## 2017-07-10 NOTE — Telephone Encounter (Signed)
Looks like pt was referred by [employer]. I will call to let her know. She was not referred by [place].

## 2017-07-13 ENCOUNTER — Encounter: Payer: Medicare Other | Admitting: Surgery

## 2017-07-16 NOTE — Telephone Encounter (Signed)
Spoke to pt daughter--OK per DPR. Told her that pt was referred to [facility] by [facility]. And she will need to call them to find out the reason.  Pt daughter verbalized understanding and thanks.

## 2017-07-18 ENCOUNTER — Other Ambulatory Visit: Payer: Self-pay

## 2017-07-18 ENCOUNTER — Inpatient Hospital Stay (HOSPITAL_COMMUNITY)
Admission: EM | Admit: 2017-07-18 | Discharge: 2017-07-22 | DRG: 193 | Disposition: A | Discharge status: Home / Self Care | Payer: Medicare Other | Attending: Internal Medicine | Admitting: Internal Medicine

## 2017-07-18 ENCOUNTER — Encounter (HOSPITAL_COMMUNITY): Payer: Self-pay | Admitting: *Deleted

## 2017-07-18 DIAGNOSIS — I2510 Atherosclerotic heart disease of native coronary artery without angina pectoris: Secondary | ICD-10-CM | POA: Diagnosis present

## 2017-07-18 DIAGNOSIS — M810 Age-related osteoporosis without current pathological fracture: Secondary | ICD-10-CM | POA: Diagnosis present

## 2017-07-18 DIAGNOSIS — E1165 Type 2 diabetes mellitus with hyperglycemia: Secondary | ICD-10-CM | POA: Diagnosis not present

## 2017-07-18 DIAGNOSIS — Z90710 Acquired absence of both cervix and uterus: Secondary | ICD-10-CM

## 2017-07-18 DIAGNOSIS — J9621 Acute and chronic respiratory failure with hypoxia: Secondary | ICD-10-CM | POA: Diagnosis present

## 2017-07-18 DIAGNOSIS — E119 Type 2 diabetes mellitus without complications: Secondary | ICD-10-CM

## 2017-07-18 DIAGNOSIS — Z794 Long term (current) use of insulin: Secondary | ICD-10-CM

## 2017-07-18 DIAGNOSIS — Z955 Presence of coronary angioplasty implant and graft: Secondary | ICD-10-CM | POA: Diagnosis not present

## 2017-07-18 DIAGNOSIS — I43 Cardiomyopathy in diseases classified elsewhere: Secondary | ICD-10-CM | POA: Diagnosis not present

## 2017-07-18 DIAGNOSIS — I130 Hypertensive heart and chronic kidney disease with heart failure and stage 1 through stage 4 chronic kidney disease, or unspecified chronic kidney disease: Secondary | ICD-10-CM | POA: Diagnosis not present

## 2017-07-18 DIAGNOSIS — E1122 Type 2 diabetes mellitus with diabetic chronic kidney disease: Secondary | ICD-10-CM | POA: Diagnosis present

## 2017-07-18 DIAGNOSIS — N183 Chronic kidney disease, stage 3 unspecified: Secondary | ICD-10-CM | POA: Diagnosis present

## 2017-07-18 DIAGNOSIS — Z888 Allergy status to other drugs, medicaments and biological substances status: Secondary | ICD-10-CM

## 2017-07-18 DIAGNOSIS — J189 Pneumonia, unspecified organism: Principal | ICD-10-CM | POA: Diagnosis present

## 2017-07-18 DIAGNOSIS — R0902 Hypoxemia: Secondary | ICD-10-CM | POA: Diagnosis not present

## 2017-07-18 DIAGNOSIS — I10 Essential (primary) hypertension: Secondary | ICD-10-CM | POA: Diagnosis present

## 2017-07-18 DIAGNOSIS — J440 Chronic obstructive pulmonary disease with acute lower respiratory infection: Secondary | ICD-10-CM | POA: Diagnosis present

## 2017-07-18 DIAGNOSIS — R000 Tachycardia, unspecified: Secondary | ICD-10-CM | POA: Diagnosis not present

## 2017-07-18 DIAGNOSIS — Z7982 Long term (current) use of aspirin: Secondary | ICD-10-CM | POA: Diagnosis not present

## 2017-07-18 DIAGNOSIS — R05 Cough: Secondary | ICD-10-CM | POA: Diagnosis not present

## 2017-07-18 DIAGNOSIS — I252 Old myocardial infarction: Secondary | ICD-10-CM | POA: Diagnosis not present

## 2017-07-18 DIAGNOSIS — G20 Parkinson's disease: Secondary | ICD-10-CM | POA: Diagnosis not present

## 2017-07-18 DIAGNOSIS — J441 Chronic obstructive pulmonary disease with (acute) exacerbation: Secondary | ICD-10-CM | POA: Diagnosis not present

## 2017-07-18 DIAGNOSIS — Z9049 Acquired absence of other specified parts of digestive tract: Secondary | ICD-10-CM

## 2017-07-18 DIAGNOSIS — Z85118 Personal history of other malignant neoplasm of bronchus and lung: Secondary | ICD-10-CM

## 2017-07-18 DIAGNOSIS — Z9981 Dependence on supplemental oxygen: Secondary | ICD-10-CM | POA: Diagnosis not present

## 2017-07-18 DIAGNOSIS — Z79891 Long term (current) use of opiate analgesic: Secondary | ICD-10-CM

## 2017-07-18 DIAGNOSIS — Z8249 Family history of ischemic heart disease and other diseases of the circulatory system: Secondary | ICD-10-CM

## 2017-07-18 DIAGNOSIS — R0602 Shortness of breath: Secondary | ICD-10-CM | POA: Diagnosis not present

## 2017-07-18 DIAGNOSIS — E785 Hyperlipidemia, unspecified: Secondary | ICD-10-CM | POA: Diagnosis not present

## 2017-07-18 DIAGNOSIS — Z87891 Personal history of nicotine dependence: Secondary | ICD-10-CM | POA: Diagnosis not present

## 2017-07-18 DIAGNOSIS — I5032 Chronic diastolic (congestive) heart failure: Secondary | ICD-10-CM | POA: Diagnosis not present

## 2017-07-18 DIAGNOSIS — Z809 Family history of malignant neoplasm, unspecified: Secondary | ICD-10-CM

## 2017-07-18 DIAGNOSIS — Z79899 Other long term (current) drug therapy: Secondary | ICD-10-CM

## 2017-07-18 DIAGNOSIS — C3490 Malignant neoplasm of unspecified part of unspecified bronchus or lung: Secondary | ICD-10-CM | POA: Diagnosis present

## 2017-07-18 DIAGNOSIS — Z833 Family history of diabetes mellitus: Secondary | ICD-10-CM

## 2017-07-18 DIAGNOSIS — Z825 Family history of asthma and other chronic lower respiratory diseases: Secondary | ICD-10-CM

## 2017-07-18 NOTE — ED Triage Notes (Addendum)
Pt c/o sob with cough that is productive with green sputum for the past week, worse tonight, pt had pulse ox 71% on fire department arrival to residence, pt is on home oxygen at 3lpm via [place] continuously. Pt was given one breathing treatment by fire department, a 5 mg albuterol treatment by ems along with 125 mg solumedrol iv with improvement in symptoms,

## 2017-07-18 NOTE — ED Notes (Signed)
Per EMS and patient states that she has to be on 3L of 02 all the time.

## 2017-07-18 NOTE — ED Notes (Signed)
EKG done upon patient arrival to room. Patient on cardiac monitoring at this time.

## 2017-07-19 ENCOUNTER — Emergency Department (HOSPITAL_COMMUNITY): Payer: Medicare Other

## 2017-07-19 DIAGNOSIS — E1122 Type 2 diabetes mellitus with diabetic chronic kidney disease: Secondary | ICD-10-CM | POA: Diagnosis present

## 2017-07-19 DIAGNOSIS — Z794 Long term (current) use of insulin: Secondary | ICD-10-CM | POA: Diagnosis not present

## 2017-07-19 DIAGNOSIS — J449 Chronic obstructive pulmonary disease, unspecified: Secondary | ICD-10-CM | POA: Diagnosis not present

## 2017-07-19 DIAGNOSIS — J440 Chronic obstructive pulmonary disease with acute lower respiratory infection: Secondary | ICD-10-CM | POA: Diagnosis present

## 2017-07-19 DIAGNOSIS — N183 Chronic kidney disease, stage 3 (moderate): Secondary | ICD-10-CM | POA: Diagnosis present

## 2017-07-19 DIAGNOSIS — Z9981 Dependence on supplemental oxygen: Secondary | ICD-10-CM | POA: Diagnosis not present

## 2017-07-19 DIAGNOSIS — R05 Cough: Secondary | ICD-10-CM | POA: Diagnosis not present

## 2017-07-19 DIAGNOSIS — I5032 Chronic diastolic (congestive) heart failure: Secondary | ICD-10-CM | POA: Diagnosis present

## 2017-07-19 DIAGNOSIS — J9621 Acute and chronic respiratory failure with hypoxia: Secondary | ICD-10-CM | POA: Diagnosis present

## 2017-07-19 DIAGNOSIS — I2510 Atherosclerotic heart disease of native coronary artery without angina pectoris: Secondary | ICD-10-CM | POA: Diagnosis present

## 2017-07-19 DIAGNOSIS — J441 Chronic obstructive pulmonary disease with (acute) exacerbation: Secondary | ICD-10-CM | POA: Diagnosis not present

## 2017-07-19 DIAGNOSIS — J189 Pneumonia, unspecified organism: Secondary | ICD-10-CM | POA: Diagnosis present

## 2017-07-19 DIAGNOSIS — G20 Parkinson's disease: Secondary | ICD-10-CM | POA: Diagnosis present

## 2017-07-19 DIAGNOSIS — Z87891 Personal history of nicotine dependence: Secondary | ICD-10-CM | POA: Diagnosis not present

## 2017-07-19 DIAGNOSIS — I43 Cardiomyopathy in diseases classified elsewhere: Secondary | ICD-10-CM | POA: Diagnosis present

## 2017-07-19 DIAGNOSIS — Z79891 Long term (current) use of opiate analgesic: Secondary | ICD-10-CM | POA: Diagnosis not present

## 2017-07-19 DIAGNOSIS — Z7982 Long term (current) use of aspirin: Secondary | ICD-10-CM | POA: Diagnosis not present

## 2017-07-19 DIAGNOSIS — I10 Essential (primary) hypertension: Secondary | ICD-10-CM | POA: Diagnosis not present

## 2017-07-19 DIAGNOSIS — M810 Age-related osteoporosis without current pathological fracture: Secondary | ICD-10-CM | POA: Diagnosis present

## 2017-07-19 DIAGNOSIS — Z85118 Personal history of other malignant neoplasm of bronchus and lung: Secondary | ICD-10-CM | POA: Diagnosis not present

## 2017-07-19 DIAGNOSIS — E1165 Type 2 diabetes mellitus with hyperglycemia: Secondary | ICD-10-CM | POA: Diagnosis present

## 2017-07-19 DIAGNOSIS — I130 Hypertensive heart and chronic kidney disease with heart failure and stage 1 through stage 4 chronic kidney disease, or unspecified chronic kidney disease: Secondary | ICD-10-CM | POA: Diagnosis present

## 2017-07-19 DIAGNOSIS — I252 Old myocardial infarction: Secondary | ICD-10-CM | POA: Diagnosis not present

## 2017-07-19 DIAGNOSIS — R0602 Shortness of breath: Secondary | ICD-10-CM | POA: Diagnosis present

## 2017-07-19 DIAGNOSIS — Z90710 Acquired absence of both cervix and uterus: Secondary | ICD-10-CM | POA: Diagnosis not present

## 2017-07-19 DIAGNOSIS — Z955 Presence of coronary angioplasty implant and graft: Secondary | ICD-10-CM | POA: Diagnosis not present

## 2017-07-19 DIAGNOSIS — E785 Hyperlipidemia, unspecified: Secondary | ICD-10-CM | POA: Diagnosis present

## 2017-07-19 DIAGNOSIS — Z9049 Acquired absence of other specified parts of digestive tract: Secondary | ICD-10-CM | POA: Diagnosis not present

## 2017-07-19 LAB — CBC WITH DIFFERENTIAL/PLATELET
Basophils Absolute: 0 10*3/uL (ref 0.0–0.1)
Basophils Absolute: 0 10*3/uL (ref 0.0–0.1)
Basophils Relative: 0 %
Basophils Relative: 0 %
Eosinophils Absolute: 0 10*3/uL (ref 0.0–0.7)
Eosinophils Absolute: 0.3 10*3/uL (ref 0.0–0.7)
Eosinophils Relative: 0 %
Eosinophils Relative: 4 %
HCT: 36.3 % (ref 36.0–46.0)
HCT: 36.5 % (ref 36.0–46.0)
Hemoglobin: 11.4 g/dL — ABNORMAL LOW (ref 12.0–15.0)
Hemoglobin: 11.5 g/dL — ABNORMAL LOW (ref 12.0–15.0)
Lymphocytes Relative: 17 %
Lymphocytes Relative: 5 %
Lymphs Abs: 0.4 10*3/uL — ABNORMAL LOW (ref 0.7–4.0)
Lymphs Abs: 1.3 10*3/uL (ref 0.7–4.0)
MCH: 29.9 pg (ref 26.0–34.0)
MCH: 30.1 pg (ref 26.0–34.0)
MCHC: 31.4 g/dL (ref 30.0–36.0)
MCHC: 31.5 g/dL (ref 30.0–36.0)
MCV: 95.3 fL (ref 78.0–100.0)
MCV: 95.5 fL (ref 78.0–100.0)
Monocytes Absolute: 0.1 10*3/uL (ref 0.1–1.0)
Monocytes Absolute: 0.5 10*3/uL (ref 0.1–1.0)
Monocytes Relative: 1 %
Monocytes Relative: 6 %
Neutro Abs: 5.8 10*3/uL (ref 1.7–7.7)
Neutro Abs: 8.2 10*3/uL — ABNORMAL HIGH (ref 1.7–7.7)
Neutrophils Relative %: 73 %
Neutrophils Relative %: 94 %
Platelets: 213 10*3/uL (ref 150–400)
Platelets: 228 10*3/uL (ref 150–400)
RBC: 3.81 MIL/uL — ABNORMAL LOW (ref 3.87–5.11)
RBC: 3.82 MIL/uL — ABNORMAL LOW (ref 3.87–5.11)
RDW: 14.3 % (ref 11.5–15.5)
RDW: 14.4 % (ref 11.5–15.5)
WBC: 8 10*3/uL (ref 4.0–10.5)
WBC: 8.7 10*3/uL (ref 4.0–10.5)

## 2017-07-19 LAB — BASIC METABOLIC PANEL
Anion gap: 12 (ref 5–15)
BUN: 22 mg/dL — ABNORMAL HIGH (ref 6–20)
CO2: 27 mmol/L (ref 22–32)
Calcium: 9.1 mg/dL (ref 8.9–10.3)
Chloride: 98 mmol/L — ABNORMAL LOW (ref 101–111)
Creatinine, Ser: 1.46 mg/dL — ABNORMAL HIGH (ref 0.44–1.00)
GFR calc Af Amer: 38 mL/min — ABNORMAL LOW (ref >60)
GFR calc non Af Amer: 33 mL/min — ABNORMAL LOW (ref >60)
Glucose, Bld: 310 mg/dL — ABNORMAL HIGH (ref 65–99)
Potassium: 4.4 mmol/L (ref 3.5–5.1)
Sodium: 137 mmol/L (ref 135–145)

## 2017-07-19 LAB — COMPREHENSIVE METABOLIC PANEL
ALT: 16 U/L (ref 14–54)
AST: 23 U/L (ref 15–41)
Albumin: 3.6 g/dL (ref 3.5–5.0)
Alkaline Phosphatase: 56 U/L (ref 38–126)
Anion gap: 10 (ref 5–15)
BUN: 19 mg/dL (ref 6–20)
CO2: 29 mmol/L (ref 22–32)
Calcium: 9.2 mg/dL (ref 8.9–10.3)
Chloride: 101 mmol/L (ref 101–111)
Creatinine, Ser: 1.45 mg/dL — ABNORMAL HIGH (ref 0.44–1.00)
GFR calc Af Amer: 38 mL/min — ABNORMAL LOW (ref >60)
GFR calc non Af Amer: 33 mL/min — ABNORMAL LOW (ref >60)
Glucose, Bld: 209 mg/dL — ABNORMAL HIGH (ref 65–99)
Potassium: 4 mmol/L (ref 3.5–5.1)
Sodium: 140 mmol/L (ref 135–145)
Total Bilirubin: 0.3 mg/dL (ref 0.3–1.2)
Total Protein: 6.8 g/dL (ref 6.5–8.1)

## 2017-07-19 LAB — GLUCOSE, CAPILLARY
Glucose-Capillary: 293 mg/dL — ABNORMAL HIGH (ref 65–99)
Glucose-Capillary: 275 mg/dL — ABNORMAL HIGH (ref 65–99)
Glucose-Capillary: 230 mg/dL — ABNORMAL HIGH (ref 65–99)
Glucose-Capillary: 215 mg/dL — ABNORMAL HIGH (ref 65–99)

## 2017-07-19 LAB — LACTIC ACID, PLASMA: Lactic Acid, Venous: 2.9 mmol/L (ref 0.5–1.9)

## 2017-07-19 MED ORDER — DEXTROSE 5 % IV SOLN
500.0000 mg | Freq: Once | INTRAVENOUS | Status: AC
  Administered 2017-07-19: 500 mg via INTRAVENOUS
  Filled 2017-07-19: qty 500

## 2017-07-19 MED ORDER — SODIUM CHLORIDE 0.9% FLUSH
3.0000 mL | Freq: Two times a day (BID) | INTRAVENOUS | Status: DC
  Administered 2017-07-19 – 2017-07-22 (×7): 3 mL via INTRAVENOUS

## 2017-07-19 MED ORDER — METHYLPREDNISOLONE SODIUM SUCC 125 MG IJ SOLR: 125.0000 mg | Freq: Once | INTRAMUSCULAR | Status: DC

## 2017-07-19 MED ORDER — ALBUTEROL SULFATE (2.5 MG/3ML) 0.083% IN NEBU
5.0000 mg | INHALATION_SOLUTION | Freq: Once | RESPIRATORY_TRACT | Status: AC
  Administered 2017-07-19: 5 mg via RESPIRATORY_TRACT
  Filled 2017-07-19: qty 6

## 2017-07-19 MED ORDER — INSULIN ASPART 100 UNIT/ML ~~LOC~~ SOLN
0.0000 [IU] | Freq: Three times a day (TID) | SUBCUTANEOUS | Status: DC
  Administered 2017-07-19: 3 [IU] via SUBCUTANEOUS
  Administered 2017-07-19 – 2017-07-21 (×7): 5 [IU] via SUBCUTANEOUS
  Administered 2017-07-21: 2 [IU] via SUBCUTANEOUS
  Administered 2017-07-22: 3 [IU] via SUBCUTANEOUS

## 2017-07-19 MED ORDER — METHYLPREDNISOLONE SODIUM SUCC 125 MG IJ SOLR
80.0000 mg | Freq: Two times a day (BID) | INTRAMUSCULAR | Status: DC
  Administered 2017-07-19 – 2017-07-20 (×3): 80 mg via INTRAVENOUS
  Filled 2017-07-19 (×3): qty 2

## 2017-07-19 MED ORDER — METOPROLOL SUCCINATE ER 25 MG PO TB24
25.0000 mg | ORAL_TABLET | Freq: Every day | ORAL | Status: DC
  Administered 2017-07-19 – 2017-07-22 (×4): 25 mg via ORAL
  Filled 2017-07-19 (×4): qty 1

## 2017-07-19 MED ORDER — SODIUM CHLORIDE 0.9% FLUSH: 3.0000 mL | INTRAVENOUS | Status: DC | PRN

## 2017-07-19 MED ORDER — IPRATROPIUM-ALBUTEROL 0.5-2.5 (3) MG/3ML IN SOLN
3.0000 mL | Freq: Four times a day (QID) | RESPIRATORY_TRACT | Status: DC
  Administered 2017-07-20 – 2017-07-22 (×7): 3 mL via RESPIRATORY_TRACT
  Filled 2017-07-19 (×6): qty 3

## 2017-07-19 MED ORDER — DEXTROSE 5 % IV SOLN
1.0000 g | INTRAVENOUS | Status: DC
  Administered 2017-07-19 – 2017-07-21 (×3): 1 g via INTRAVENOUS
  Filled 2017-07-19 (×4): qty 10

## 2017-07-19 MED ORDER — ZOLPIDEM TARTRATE 5 MG PO TABS
5.0000 mg | ORAL_TABLET | Freq: Once | ORAL | Status: AC
  Administered 2017-07-19: 5 mg via ORAL
  Filled 2017-07-19: qty 1

## 2017-07-19 MED ORDER — ALBUTEROL SULFATE (2.5 MG/3ML) 0.083% IN NEBU
2.5000 mg | INHALATION_SOLUTION | RESPIRATORY_TRACT | Status: DC | PRN
  Administered 2017-07-19: 2.5 mg via RESPIRATORY_TRACT
  Filled 2017-07-19: qty 3

## 2017-07-19 MED ORDER — ASPIRIN EC 81 MG PO TBEC
81.0000 mg | DELAYED_RELEASE_TABLET | Freq: Every day | ORAL | Status: DC
  Administered 2017-07-19 – 2017-07-22 (×4): 81 mg via ORAL
  Filled 2017-07-19 (×4): qty 1

## 2017-07-19 MED ORDER — FUROSEMIDE 40 MG PO TABS
40.0000 mg | ORAL_TABLET | Freq: Every day | ORAL | Status: DC
  Administered 2017-07-19 – 2017-07-22 (×4): 40 mg via ORAL
  Filled 2017-07-19 (×4): qty 1

## 2017-07-19 MED ORDER — IPRATROPIUM BROMIDE 0.02 % IN SOLN
0.5000 mg | Freq: Once | RESPIRATORY_TRACT | Status: AC
  Administered 2017-07-19: 0.5 mg via RESPIRATORY_TRACT
  Filled 2017-07-19: qty 2.5

## 2017-07-19 MED ORDER — SODIUM CHLORIDE 0.9 % IV SOLN: 250.0000 mL | INTRAVENOUS | Status: DC | PRN

## 2017-07-19 MED ORDER — GUAIFENESIN ER 600 MG PO TB12
1200.0000 mg | ORAL_TABLET | Freq: Two times a day (BID) | ORAL | Status: DC
  Administered 2017-07-19 – 2017-07-22 (×7): 1200 mg via ORAL
  Filled 2017-07-19 (×7): qty 2

## 2017-07-19 MED ORDER — DEXTROSE 5 % IV SOLN
500.0000 mg | INTRAVENOUS | Status: DC
  Administered 2017-07-20 – 2017-07-22 (×3): 500 mg via INTRAVENOUS
  Filled 2017-07-19 (×3): qty 500

## 2017-07-19 MED ORDER — CEFTRIAXONE SODIUM 1 G IJ SOLR
1.0000 g | Freq: Once | INTRAMUSCULAR | Status: AC
  Administered 2017-07-19: 1 g via INTRAVENOUS
  Filled 2017-07-19: qty 10

## 2017-07-19 MED ORDER — ALBUTEROL SULFATE HFA 108 (90 BASE) MCG/ACT IN AERS: 2.0000 | INHALATION_SPRAY | RESPIRATORY_TRACT | Status: DC | PRN

## 2017-07-19 MED ORDER — IPRATROPIUM-ALBUTEROL 0.5-2.5 (3) MG/3ML IN SOLN
3.0000 mL | Freq: Four times a day (QID) | RESPIRATORY_TRACT | Status: DC | PRN
  Administered 2017-07-19 (×2): 3 mL via RESPIRATORY_TRACT
  Filled 2017-07-19 (×2): qty 3

## 2017-07-19 MED ORDER — ORAL CARE MOUTH RINSE
15.0000 mL | Freq: Two times a day (BID) | OROMUCOSAL | Status: DC
  Administered 2017-07-19 – 2017-07-21 (×5): 15 mL via OROMUCOSAL

## 2017-07-19 NOTE — Consult Note (Signed)
Consult requested by: Dr. Legrand Rams Consult requested for: Pneumonia/COPD exacerbation  HPI: This is an 83 year old who has a history of heart failure COPD personal history of adenocarcinoma of the lung with left lung surgery, hypertension, Parkinson's disease, cardiomyopathy chronic hypoxic respiratory failure. She had been sick for about 3 days with increasing cough and congestion that eventually got to the point that she came to the emergency room for evaluation and treatment. When she came to the emergency department she was found to be mildly hypoxic was given nebulizer treatments and improved but was still hypoxic and significantly short of breath with even minimal exertion and was admitted because of that. She has what appears to be developing pneumonia on chest x-ray that I personally reviewed. She says she is coughing up some sputum but not much. She's not had any definite fever or chills. She has a little bit of pleuritic chest pain but no substernal pain. Abdominal pain nausea vomiting headache urinary symptoms.  Past Medical History:  Diagnosis Date  . Adenocarcinoma of lung ([place])    Left lung 2009, resected  . Anginal pain ([place])   . Arthritis   . Back pain   . CHF (congestive heart failure) ([place])   . COPD (chronic obstructive pulmonary disease) ([place])   . Diverticulitis   . Dyslipidemia   . Essential hypertension   . H/O ventral hernia   . Non-obstructive CAD    a. 04/2015 NSTEMI/Cath: LAD 10p, LCX 60m, RCA 22m, 20d, EF 35-40 w/ apical ballooning.  . On home O2    3L N/C  . Osteoporosis   . Osteoporosis 11/04/2015   Managed by Dr. Legrand Rams   . Parkinson's disease ([place])   . Shortness of breath   . Takotsubo cardiomyopathy    a. 04/2015 Echo: EF 45-50%, mid-dist anterior/apical/inferoapical HK w/ hyperdynamic base. Gr 1 DD, mild AI, mild-mod MR, triv TR, PASP 41mmHg;  b. 04/2015 LV gram: Ef 35-40% w/ apical ballooning.  . Type II diabetes mellitus ([place])   . Ventricular bigeminy    a.  04/2015 in setting of NSTEMI/Takotsubo.     Family History  Problem Relation Age of Onset  . Diabetes Mother   . Hypertension Mother   . Diabetes Father   . Asthma Unknown   . Cancer Unknown   . Heart attack Sister        X2  . Colon cancer Neg Hx      Social History   Social History  . Marital status: Widowed    Spouse name: N/A  . Number of children: N/A  . Years of education: N/A   Social History Main Topics  . Smoking status: Former Smoker    Years: 20.00    Types: Cigarettes    Quit date: 08/13/2006  . Smokeless tobacco: Never Used  . Alcohol use No  . Drug use: No  . Sexual activity: Not Currently    Birth control/ protection: Surgical   Other Topics Concern  . None   Social History Narrative  . None     ROS: Except as mentioned 10 point review of systems is negative    Objective: Vital signs in last 24 hours: Temp:  [98 F (36.7 C)-98.7 F (37.1 C)] 98.7 F (37.1 C) (08/12 0425) Pulse Rate:  [87-99] 98 (08/12 0425) Resp:  [15-22] 20 (08/12 0330) BP: (110-140)/(37-109) 140/53 (08/12 0425) SpO2:  [93 %-97 %] 96 % (08/12 0425) Weight:  [81.6 kg (180 lb)-82.1 kg (180 lb 14.4 oz)] 82.1 kg (  180 lb 14.4 oz) (08/12 0425) Weight change:  Last BM Date: 07/19/17  Intake/Output from previous day: 08/11 0701 - 08/12 0700 In: 300 [IV Piggyback:300] Out: -   PHYSICAL EXAM Constitutional: She is awake and alert and in no acute distress. She is wearing nasal oxygen. Eyes: Pupils react. EOMI. Ears nose mouth and throat: Her mucous membranes are moist. Hearing is grossly normal. No masses in her neck. Cardiovascular: Her heart is regular with normal heart sounds. No gallop. No peripheral edema. Respiratory: Her respiratory effort is mildly increased and she has wheezing and rhonchi bilaterally. Gastrointestinal: Her abdomen is soft with no masses. Skin: Warm and dry. Musculoskeletal: Normal strength. Neurological: No focal abnormalities. Psychiatric: Normal mood and  affect  Lab Results: Basic Metabolic Panel:  Recent Labs  07/19/17 0024 07/19/17 0609  NA 140 137  K 4.0 4.4  CL 101 98*  CO2 29 27  GLUCOSE 209* 310*  BUN 19 22*  CREATININE 1.45* 1.46*  CALCIUM 9.2 9.1   Liver Function Tests:  Recent Labs  07/19/17 0024  AST 23  ALT 16  ALKPHOS 56  BILITOT 0.3  PROT 6.8  ALBUMIN 3.6   No results for input(s): LIPASE, AMYLASE in the last 72 hours. No results for input(s): AMMONIA in the last 72 hours. CBC:  Recent Labs  07/19/17 0024 07/19/17 0609  WBC 8.0 8.7  NEUTROABS 5.8 8.2*  HGB 11.5* 11.4*  HCT 36.5 36.3  MCV 95.5 95.3  PLT 213 228   Cardiac Enzymes: No results for input(s): CKTOTAL, CKMB, CKMBINDEX, TROPONINI in the last 72 hours. BNP: No results for input(s): PROBNP in the last 72 hours. D-Dimer: No results for input(s): DDIMER in the last 72 hours. CBG:  Recent Labs  07/19/17 0744  GLUCAP 293*   Hemoglobin A1C: No results for input(s): HGBA1C in the last 72 hours. Fasting Lipid Panel: No results for input(s): CHOL, HDL, LDLCALC, TRIG, CHOLHDL, LDLDIRECT in the last 72 hours. Thyroid Function Tests: No results for input(s): TSH, T4TOTAL, FREET4, T3FREE, THYROIDAB in the last 72 hours. Anemia Panel: No results for input(s): VITAMINB12, FOLATE, FERRITIN, TIBC, IRON, RETICCTPCT in the last 72 hours. Coagulation: No results for input(s): LABPROT, INR in the last 72 hours. Urine Drug Screen: Drugs of Abuse  No results found for: LABOPIA, COCAINSCRNUR, LABBENZ, AMPHETMU, THCU, LABBARB  Alcohol Level: No results for input(s): ETH in the last 72 hours. Urinalysis: No results for input(s): COLORURINE, LABSPEC, PHURINE, GLUCOSEU, HGBUR, BILIRUBINUR, KETONESUR, PROTEINUR, UROBILINOGEN, NITRITE, LEUKOCYTESUR in the last 72 hours.  Invalid input(s): APPERANCEUR Misc. Labs:   ABGS: No results for input(s): PHART, PO2ART, TCO2, HCO3 in the last 72 hours.  Invalid input(s): PCO2   MICROBIOLOGY: Recent  Results (from the past 240 hour(s))  Blood Culture (routine x 2)     Status: None (Preliminary result)   Collection Time: 07/19/17 12:24 AM  Result Value Ref Range Status   Specimen Description BLOOD  Final   Special Requests NONE  Final   Culture NO GROWTH < 12 HOURS  Final   Report Status PENDING  Incomplete  Blood Culture (routine x 2)     Status: None (Preliminary result)   Collection Time: 07/19/17 12:31 AM  Result Value Ref Range Status   Specimen Description BLOOD  Final   Special Requests NONE  Final   Culture NO GROWTH < 12 HOURS  Final   Report Status PENDING  Incomplete    Studies/Results: Dg Chest Port 1 View  Result Date: 07/19/2017  CLINICAL DATA:  83 y/o  F; shortness of breath and cough. EXAM: PORTABLE CHEST 1 VIEW COMPARISON:  06/22/2017 chest radiograph FINDINGS: The mild cardiomegaly. Aortic atherosclerosis with calcification. Mild pulmonary vascular congestion. Hazy opacity left lung base. No pleural effusion or pneumothorax. Right port catheter tip projects over lower SVC. No acute osseous abnormality is evident. IMPRESSION: Hazy opacification of left lung base may represent atelectasis or pulmonary infiltrate. Pulmonary vascular congestion and cardiomegaly. Electronically Signed   By: Kristine Garbe M.D.   On: 07/19/2017 00:35    Medications:  Prior to Admission:  Prescriptions Prior to Admission  Medication Sig Dispense Refill Last Dose  . albuterol (PROAIR HFA) 108 (90 BASE) MCG/ACT inhaler Inhale 2 puffs into the lungs every 4 (four) hours as needed for wheezing or shortness of breath.   Taking  . alendronate (FOSAMAX) 70 MG tablet Take 70 mg by mouth every Friday.    Taking  . aspirin EC 81 MG tablet Take 81 mg by mouth daily.   Taking  . Atezolizumab (TECENTRIQ IV) Inject into the vein. Every 3 weeks   Taking  . atorvastatin (LIPITOR) 40 MG tablet Take 40 mg by mouth at bedtime.    Taking  . buPROPion (WELLBUTRIN SR) 150 MG 12 hr tablet Take 150 mg  by mouth 2 (two) times daily.   Taking  . cholecalciferol (VITAMIN D) 1000 units tablet Take 1,000 Units by mouth daily.   Taking  . doxycycline (VIBRAMYCIN) 100 MG capsule Take 1 capsule (100 mg total) by mouth 2 (two) times daily. 20 capsule 0 Taking  . furosemide (LASIX) 40 MG tablet Take 40 mg by mouth daily. Pt is able to take one additional tablet daily for weight increases of 3lbs in a day or 5lbs in a week.   Taking  . gabapentin (NEURONTIN) 300 MG capsule Take 600 mg by mouth at bedtime.    Taking  . Garlic 106 MG TABS Take 300 mg by mouth daily.   Taking  . ipratropium-albuterol (DUONEB) 0.5-2.5 (3) MG/3ML SOLN Inhale 3 mLs into the lungs every 6 (six) hours as needed (for wheezing/shortness of breath).    Taking  . LANTUS SOLOSTAR 100 UNIT/ML Solostar Pen Inject 15 Units into the skin at bedtime.    Taking  . lidocaine-prilocaine (EMLA) cream Apply to affected area once 30 g 3 Taking  . linagliptin (TRADJENTA) 5 MG TABS tablet Take 5 mg by mouth daily.   Taking  . loratadine (CLARITIN) 10 MG tablet Take 10 mg by mouth daily.  3 Taking  . losartan (COZAAR) 50 MG tablet Take 50 mg by mouth daily.  2 Taking  . meclizine (ANTIVERT) 25 MG tablet Take 25 mg by mouth daily.    Taking  . metoprolol succinate (TOPROL-XL) 25 MG 24 hr tablet Take 1 tablet (25 mg total) by mouth daily. 90 tablet 3 Taking  . mupirocin cream (BACTROBAN) 2 % Apply 1 application topically 2 (two) times daily. 15 g 0 Taking  . nitroGLYCERIN (NITROSTAT) 0.4 MG SL tablet Place 0.4 mg under the tongue every 5 (five) minutes as needed for chest pain.   Taking  . Omega-3 Fatty Acids (FISH OIL PO) Take 1 capsule by mouth daily.   Taking  . omeprazole (PRILOSEC) 40 MG capsule Take 40 mg by mouth daily.   Taking  . ondansetron (ZOFRAN) 8 MG tablet Take 1 tablet (8 mg total) by mouth 2 (two) times daily as needed (Nausea or vomiting). 30 tablet 1 Taking  .  polyethylene glycol powder (GLYCOLAX/MIRALAX) powder Take 1 capful daily  as needed. 255 g 2 Taking  . potassium chloride (K-DUR) 10 MEQ tablet Take 1 tablet (10 mEq total) by mouth daily. 90 tablet 3 Taking  . potassium chloride (K-DUR) 10 MEQ tablet Take 1 tablet (10 mEq total) by mouth daily. *Further refills need to be authorized by PCP* 90 tablet 0 Taking  . prochlorperazine (COMPAZINE) 10 MG tablet Take 1 tablet (10 mg total) by mouth every 6 (six) hours as needed (Nausea or vomiting). 30 tablet 1 Taking  . pyridoxine (B-6) 100 MG tablet Take 100 mg by mouth daily.    Taking  . rOPINIRole (REQUIP) 0.5 MG tablet Take 0.5 mg by mouth 3 (three) times daily.   Taking  . sodium chloride (OCEAN) 0.65 % SOLN nasal spray Place 1 spray into both nostrils every 3 (three) hours as needed for congestion.    Taking  . traMADol (ULTRAM) 50 MG tablet Take 50 mg by mouth every 12 (twelve) hours as needed for moderate pain.    Taking  . zolpidem (AMBIEN) 5 MG tablet Take 5 mg by mouth at bedtime as needed for sleep.   Taking   Scheduled: . aspirin EC  81 mg Oral Daily  . furosemide  40 mg Oral Daily  . guaiFENesin  1,200 mg Oral BID  . insulin aspart  0-9 Units Subcutaneous TID WC  . mouth rinse  15 mL Mouth Rinse BID  . methylPREDNISolone (SOLU-MEDROL) injection  80 mg Intravenous Q12H  . metoprolol succinate  25 mg Oral Daily  . sodium chloride flush  3 mL Intravenous Q12H   Continuous: . sodium chloride    . azithromycin    . cefTRIAXone (ROCEPHIN)  IV     EHO:ZYYQMG chloride, ipratropium-albuterol, sodium chloride flush  Assesment: She has community-acquired pneumonia. She has history of adenocarcinoma of the same lung. She has COPD exacerbation. She has acute on chronic hypoxic respiratory failure. She's been treated appropriately for all of the above. She has diabetes and her blood sugars are up probably because she's on steroids. She has chronic kidney disease stage III and that seems to be stable. Principal Problem:   Community acquired pneumonia of left  lung Active Problems:   Adenocarcinoma of lung (HCC)   CKD (chronic kidney disease) stage 3, GFR 30-59 ml/min   Essential hypertension   Type II diabetes mellitus (H[facility])   CHF (congestive heart failure) (H[facility])   COPD with acute exacerbation ([facility])   Acute on chronic respiratory failure with hypoxia ([place])    Plan:continue current treatment. I added a flutter valve and Mucinex. No other new treatments.    LOS: 0 days   Kweku Stankey L 07/19/2017, 9:52 AM

## 2017-07-19 NOTE — ED Notes (Signed)
Blood cultures x 2 have been drawn by lab

## 2017-07-19 NOTE — ED Notes (Signed)
Ems reports that after pt's breathing treatment that was started by fire department when moving pt to stretcher pt's sat dropped back to mid 80's on oxygen,

## 2017-07-19 NOTE — ED Notes (Signed)
CRITICAL VALUE ALERT  Critical Value:  Lactic Acid 2.9 Date & Time Notied:  07/19/17 @ 0140 Provider Notified:Dr. Eliane Decree Orders Received/Actions taken: None yet

## 2017-07-19 NOTE — Plan of Care (Signed)
Problem: Safety: Goal: Ability to remain free from injury will improve Outcome: Progressing Bed in low position, side rails up, call bell and personal items within reach.

## 2017-07-19 NOTE — H&P (Signed)
History and Physical    Angela Lawson KGM:[id number] DOB: [birth year] DOA: 07/18/2017  PCP: Rosita Fire, MD  Patient coming from: home  Chief Complaint:  sob  HPI: Angela Lawson is a 83 y.o. female with medical history significant of adv copd on 3 liters at home, CHF, htn, CAD, DM, CRF comes in with about 2 days of worsening sob and not feeling well.  She has been coughing more than normal.  No fevers or chills.  No swelling or pain in her legs.  She called ems due to sob.  She was wheezing and her oxygen sats were 71%on her 3 liters of oxygen.  She was brought to the ED given solumedrol and several nebs and improved.  She is back to almost normal breathing now on her 3 liters.  But with any movement she desats.  cxr reveals a developing pna.  She says she feels better.  She has not been on abx for over 4 months per her report.  Pt referred for admission for acute on chronic resp failure with pna and copde.  Review of Systems: As per HPI otherwise 10 point review of systems negative.   Past Medical History:  Diagnosis Date  . Adenocarcinoma of lung ([place])    Left lung 2009, resected  . Anginal pain ([place])   . Arthritis   . Back pain   . CHF (congestive heart failure) ([place])   . COPD (chronic obstructive pulmonary disease) ([place])   . Diverticulitis   . Dyslipidemia   . Essential hypertension   . H/O ventral hernia   . Non-obstructive CAD    a. 04/2015 NSTEMI/Cath: LAD 10p, LCX 51m, RCA 85m, 20d, EF 35-40 w/ apical ballooning.  . On home O2    3L N/C  . Osteoporosis   . Osteoporosis 11/04/2015   Managed by Dr. Legrand Rams   . Parkinson's disease ([place])   . Shortness of breath   . Takotsubo cardiomyopathy    a. 04/2015 Echo: EF 45-50%, mid-dist anterior/apical/inferoapical HK w/ hyperdynamic base. Gr 1 DD, mild AI, mild-mod MR, triv TR, PASP 78mmHg;  b. 04/2015 LV gram: Ef 35-40% w/ apical ballooning.  . Type II diabetes mellitus ([place])   . Ventricular bigeminy    a. 04/2015 in setting of  NSTEMI/Takotsubo.    Past Surgical History:  Procedure Laterality Date  . ABDOMINAL HYSTERECTOMY    . CARDIAC CATHETERIZATION N/A 04/17/2015   Procedure: Left Heart Cath and Coronary Angiography;  Surgeon: Troy Sine, MD;  Location: [facility];  Service: Cardiovascular;  Laterality: N/A;  . CHOLECYSTECTOMY    . COLONOSCOPY N/A 09/18/2014   Procedure: COLONOSCOPY;  Surgeon: Danie Binder, MD;  Location: [facility];  Service: Endoscopy;  Laterality: N/A;  8:30 AM - moved to 10:30 Rosendo Gros to notify pt  . ECTOPIC PREGNANCY SURGERY    . INCISIONAL HERNIA REPAIR N/A 08/26/2013   Procedure: HERNIA REPAIR INCISIONAL WITH MESH;  Surgeon: Jamesetta So, MD;  Location: [facility];  Service: General;  Laterality: N/A;  . IR FLUORO GUIDE PORT INSERTION RIGHT  04/30/2017  . IR US GUIDE VASC ACCESS RIGHT  04/30/2017  . LUNG CANCER SURGERY    . VIDEO BRONCHOSCOPY WITH ENDOBRONCHIAL NAVIGATION N/A 04/23/2017   Procedure: VIDEO BRONCHOSCOPY WITH ENDOBRONCHIAL NAVIGATION;  Surgeon: Melrose Nakayama, MD;  Location: [facility];  Service: Thoracic;  Laterality: N/A;  . VIDEO BRONCHOSCOPY WITH ENDOBRONCHIAL ULTRASOUND N/A 04/23/2017   Procedure: VIDEO BRONCHOSCOPY WITH ENDOBRONCHIAL ULTRASOUND;  Surgeon: Melrose Nakayama, MD;  Location: [facility];  Service: Thoracic;  Laterality: N/A;     reports that she quit smoking about 10 years ago. Her smoking use included Cigarettes. She quit after 20.00 years of use. She has never used smokeless tobacco. She reports that she does not drink alcohol or use drugs.  Allergies  Allergen Reactions  . Lisinopril Cough    Family History  Problem Relation Age of Onset  . Diabetes Mother   . Hypertension Mother   . Diabetes Father   . Asthma Unknown   . Cancer Unknown   . Heart attack Sister        X2  . Colon cancer Neg Hx     Prior to Admission medications   Medication Sig Start Date End Date Taking? Authorizing Provider  albuterol (PROAIR HFA) 108 (90  BASE) MCG/ACT inhaler Inhale 2 puffs into the lungs every 4 (four) hours as needed for wheezing or shortness of breath.    [provider]  alendronate (FOSAMAX) 70 MG tablet Take 70 mg by mouth every Friday.     [provider]  aspirin EC 81 MG tablet Take 81 mg by mouth daily.    [provider]  Atezolizumab (TECENTRIQ IV) Inject into the vein. Every 3 weeks    [provider]  atorvastatin (LIPITOR) 40 MG tablet Take 40 mg by mouth at bedtime.     [provider]  buPROPion (WELLBUTRIN SR) 150 MG 12 hr tablet Take 150 mg by mouth 2 (two) times daily.    [provider]  cholecalciferol (VITAMIN D) 1000 units tablet Take 1,000 Units by mouth daily.    [provider]  doxycycline (VIBRAMYCIN) 100 MG capsule Take 1 capsule (100 mg total) by mouth 2 (two) times daily. 06/23/17   Orpah Greek, MD  furosemide (LASIX) 40 MG tablet Take 40 mg by mouth daily. Pt is able to take one additional tablet daily for weight increases of 3lbs in a day or 5lbs in a week.    [provider]  gabapentin (NEURONTIN) 300 MG capsule Take 600 mg by mouth at bedtime.     [provider]  Garlic 237 MG TABS Take 300 mg by mouth daily.    [provider]  ipratropium-albuterol (DUONEB) 0.5-2.5 (3) MG/3ML SOLN Inhale 3 mLs into the lungs every 6 (six) hours as needed (for wheezing/shortness of breath).     [provider]  LANTUS SOLOSTAR 100 UNIT/ML Solostar Pen Inject 15 Units into the skin at bedtime.     [provider]  lidocaine-prilocaine (EMLA) cream Apply to affected area once 05/19/17   Brunetta Genera, MD  linagliptin (TRADJENTA) 5 MG TABS tablet Take 5 mg by mouth daily.    [provider]  loratadine (CLARITIN) 10 MG tablet Take 10 mg by mouth daily.    [provider]  losartan (COZAAR) 50 MG tablet Take 50 mg by mouth daily.    [provider]  meclizine  (ANTIVERT) 25 MG tablet Take 25 mg by mouth daily.     [provider]  metoprolol succinate (TOPROL-XL) 25 MG 24 hr tablet Take 1 tablet (25 mg total) by mouth daily. 06/26/15   Jettie Booze, MD  mupirocin cream (BACTROBAN) 2 % Apply 1 application topically 2 (two) times daily. 05/15/17   Evalee Jefferson, PA-C  nitroGLYCERIN (NITROSTAT) 0.4 MG SL tablet Place 0.4 mg under the tongue every 5 (five) minutes as  needed for chest pain.    [provider]  Omega-3 Fatty Acids (FISH OIL PO) Take 1 capsule by mouth daily.    [provider]  omeprazole (PRILOSEC) 40 MG capsule Take 40 mg by mouth daily.    [provider]  ondansetron (ZOFRAN) 8 MG tablet Take 1 tablet (8 mg total) by mouth 2 (two) times daily as needed (Nausea or vomiting). 05/19/17   Brunetta Genera, MD  polyethylene glycol powder [facility]) powder Take 1 capful daily as needed. 06/01/17   Twana First, MD  potassium chloride (K-DUR) 10 MEQ tablet Take 1 tablet (10 mEq total) by mouth daily. 05/21/16   Isaiah Serge, NP  potassium chloride (K-DUR) 10 MEQ tablet Take 1 tablet (10 mEq total) by mouth daily. *Further refills need to be authorized by PCP* 05/15/17 08/13/17  Isaiah Serge, NP  prochlorperazine (COMPAZINE) 10 MG tablet Take 1 tablet (10 mg total) by mouth every 6 (six) hours as needed (Nausea or vomiting). 05/19/17   Brunetta Genera, MD  pyridoxine (B-6) 100 MG tablet Take 100 mg by mouth daily.     [provider]  rOPINIRole (REQUIP) 0.5 MG tablet Take 0.5 mg by mouth 3 (three) times daily.    [provider]  sodium chloride (OCEAN) 0.65 % SOLN nasal spray Place 1 spray into both nostrils every 3 (three) hours as needed for congestion.     [provider]  traMADol (ULTRAM) 50 MG tablet Take 50 mg by mouth every 12 (twelve) hours as needed for moderate pain.     [provider]  zolpidem (AMBIEN) 5 MG tablet Take 5 mg by mouth at bedtime as  needed for sleep.    [provider]    Physical Exam: Vitals:   07/19/17 0215 07/19/17 0230 07/19/17 0300 07/19/17 0330  BP: 138/61 (!) 120/109 (!) 113/99 (!) 139/54  Pulse: 90 91 94 99  Resp: 19 16 15 20   Temp:      TempSrc:      SpO2: 95% 97% 96% 93%  Weight:      Height:          Constitutional: NAD, calm, comfortable Vitals:   07/19/17 0215 07/19/17 0230 07/19/17 0300 07/19/17 0330  BP: 138/61 (!) 120/109 (!) 113/99 (!) 139/54  Pulse: 90 91 94 99  Resp: 19 16 15 20   Temp:      TempSrc:      SpO2: 95% 97% 96% 93%  Weight:      Height:       Eyes: PERRL, lids and conjunctivae normal ENMT: Mucous membranes are moist. Posterior pharynx clear of any exudate or lesions.Normal dentition.  Neck: normal, supple, no masses, no thyromegaly Respiratory: clear to auscultation bilaterally, no wheezing, no crackles. Normal respiratory effort. No accessory muscle use.  Cardiovascular: Regular rate and rhythm, no murmurs / rubs / gallops. No extremity edema. 2+ pedal pulses. No carotid bruits.  Abdomen: no tenderness, no masses palpated. No hepatosplenomegaly. Bowel sounds positive.  Musculoskeletal: no clubbing / cyanosis. No joint deformity upper and lower extremities. Good ROM, no contractures. Normal muscle tone.  Skin: no rashes, lesions, ulcers. No induration Neurologic: CN 2-12 grossly intact. Sensation intact, DTR normal. Strength 5/5 in all 4.  Psychiatric: Normal judgment and insight. Alert and oriented x 3. Normal mood.    Labs on Admission: I have personally reviewed following labs and imaging studies  CBC:  Recent Labs Lab 07/19/17 0024  WBC 8.0  NEUTROABS  5.8  HGB 11.5*  HCT 36.5  MCV 95.5  PLT 517   Basic Metabolic Panel:  Recent Labs Lab 07/19/17 0024  NA 140  K 4.0  CL 101  CO2 29  GLUCOSE 209*  BUN 19  CREATININE 1.45*  CALCIUM 9.2   GFR: Estimated Creatinine Clearance: 31.5 mL/min (A) (by C-G formula based on SCr of 1.45 mg/dL  (H)). Liver Function Tests:  Recent Labs Lab 07/19/17 0024  AST 23  ALT 16  ALKPHOS 56  BILITOT 0.3  PROT 6.8  ALBUMIN 3.6    Radiological Exams on Admission: Dg Chest Port 1 View  Result Date: 07/19/2017 CLINICAL DATA:  83 y/o  F; shortness of breath and cough. EXAM: PORTABLE CHEST 1 VIEW COMPARISON:  06/22/2017 chest radiograph FINDINGS: The mild cardiomegaly. Aortic atherosclerosis with calcification. Mild pulmonary vascular congestion. Hazy opacity left lung base. No pleural effusion or pneumothorax. Right port catheter tip projects over lower SVC. No acute osseous abnormality is evident. IMPRESSION: Hazy opacification of left lung base may represent atelectasis or pulmonary infiltrate. Pulmonary vascular congestion and cardiomegaly. Electronically Signed   By: Kristine Garbe M.D.   On: 07/19/2017 00:35    Assessment/Plan 83 yo female with acute on chronic respiratory hypoxic failure due to copde and pna  Principal Problem:   Community acquired pneumonia of left lung- iv rocpehin and azithro.  Treat copd as below Obtain blood and sputum cx data.  Active Problems:   COPD with acute exacerbation ([place])- frreq nebs and iv solumedrol.  Treat infection as above   Acute on chronic respiratory failure with hypoxia (HCC)- improved and almost back to baseline now   Adenocarcinoma of lung (HCC)- noted, stage 4   CKD (chronic kidney disease) stage 3, GFR 30-59 ml/min- at baseline   Essential hypertension- stable   Type II diabetes mellitus ([place])- hold oral meds, SSI   CHF (congestive heart failure) ([place])- compensated at this time, no ivf at this time     DVT prophylaxis:  scds Code Status:  full Family Communication:  none Disposition Plan:  Per day team Consults called: none Admission status:  admission   Anneth Brunell A MD [facility]  If 7PM-7AM, please contact night-coverage [email or web address] Password [email or web address]  07/19/2017, 4:11 AM

## 2017-07-19 NOTE — ED Provider Notes (Signed)
[facility] DEPT Provider Note   CSN: [id number] Arrival date & time: 07/18/17  2343  Time seen 12:10 AM   History   Chief Complaint Chief Complaint  Patient presents with  . Shortness of Breath    HPI Angela Lawson is a 83 y.o. female.  HPI  patient has a history of stage IV lung cancer and COPD. She is on home oxygen at 3 L/m nasal cannula. She reports for the past week she has been getting more short of breath. She's had a cough for a couple days and sometimes coughs up green mucus. She describes a lot of dyspnea on exertion. She has been wheezing and states her nebulizer helps however they do not last long. She states she used her nebulizer at home today at least 10 times. She denies any fevers, chills, rhinorrhea, sore throat, nausea, vomiting, diarrhea, or chest pain. She denies any swelling of her legs. EMS report that on their arrival tonight her pulse ox was 71%. She was given a nebulizer treatment with 5 mg of albuterol and Solu-Medrol 125 mg IV. They also note that when the patient was moved to the stretcher from the ED stretcher her pulse ox dropped into the 80s.  PCP Rosita Fire, MD Oncology Dr Talbert Cage  Past Medical History:  Diagnosis Date  . Adenocarcinoma of lung ([place])    Left lung 2009, resected  . Anginal pain ([place])   . Arthritis   . Back pain   . CHF (congestive heart failure) ([place])   . COPD (chronic obstructive pulmonary disease) ([place])   . Diverticulitis   . Dyslipidemia   . Essential hypertension   . H/O ventral hernia   . Non-obstructive CAD    a. 04/2015 NSTEMI/Cath: LAD 10p, LCX 57m, RCA 72m, 20d, EF 35-40 w/ apical ballooning.  . On home O2    3L N/C  . Osteoporosis   . Osteoporosis 11/04/2015   Managed by Dr. Legrand Rams   . Parkinson's disease ([place])   . Shortness of breath   . Takotsubo cardiomyopathy    a. 04/2015 Echo: EF 45-50%, mid-dist anterior/apical/inferoapical HK w/ hyperdynamic base. Gr 1 DD, mild AI, mild-mod MR, triv TR, PASP 62mmHg;  b.  04/2015 LV gram: Ef 35-40% w/ apical ballooning.  . Type II diabetes mellitus ([place])   . Ventricular bigeminy    a. 04/2015 in setting of NSTEMI/Takotsubo.    Patient Active Problem List   Diagnosis Date Noted  . Critical lower limb ischemia 07/07/2017  . Counseling regarding advanced care planning and goals of care 05/15/2017  . Acute renal injury ([place]) 04/04/2017  . Chest pain 04/04/2017  . Nausea and vomiting 04/04/2017  . COPD with acute exacerbation ([place]) 04/25/2016  . Acute on chronic respiratory failure with hypoxia ([place]) 04/25/2016  . SOB (shortness of breath)   . Osteoporosis 11/04/2015  . CHF (congestive heart failure) ([place]) 08/01/2015  . CHF exacerbation ([place]) 07/09/2015  . Essential hypertension 04/19/2015  . Type II diabetes mellitus ([place]) 04/19/2015  . Dyslipidemia 04/19/2015  . History of lung cancer 04/19/2015  . Takotsubo cardiomyopathy 04/19/2015  . Ventricular bigeminy 04/19/2015  . NSTEMI (non-ST elevated myocardial infarction) ([place]) 04/19/2015  . Stress-induced cardiomyopathy 04/18/2015  . Elevated troponin 04/16/2015  . COPD exacerbation ([place]) 04/16/2015  . CKD (chronic kidney disease) stage 3, GFR 30-59 ml/min 04/16/2015  . Herpes genitalis in women 11/24/2014  . Toe fracture 09/28/2014  . Non-traumatic tear of right rotator cuff 11/30/2013  . Pain in joint, shoulder  region 11/30/2013  . Muscle weakness (generalized) 11/30/2013  . Bursitis of left shoulder 02/09/2013  . Adenocarcinoma of lung ([place]) 08/20/2012    Past Surgical History:  Procedure Laterality Date  . ABDOMINAL HYSTERECTOMY    . CARDIAC CATHETERIZATION N/A 04/17/2015   Procedure: Left Heart Cath and Coronary Angiography;  Surgeon: Troy Sine, MD;  Location: [facility];  Service: Cardiovascular;  Laterality: N/A;  . CHOLECYSTECTOMY    . COLONOSCOPY N/A 09/18/2014   Procedure: COLONOSCOPY;  Surgeon: Danie Binder, MD;  Location: [facility];  Service: Endoscopy;  Laterality:  N/A;  8:30 AM - moved to 10:30 Rosendo Gros to notify pt  . ECTOPIC PREGNANCY SURGERY    . INCISIONAL HERNIA REPAIR N/A 08/26/2013   Procedure: HERNIA REPAIR INCISIONAL WITH MESH;  Surgeon: Jamesetta So, MD;  Location: [facility];  Service: General;  Laterality: N/A;  . IR FLUORO GUIDE PORT INSERTION RIGHT  04/30/2017  . IR US GUIDE VASC ACCESS RIGHT  04/30/2017  . LUNG CANCER SURGERY    . VIDEO BRONCHOSCOPY WITH ENDOBRONCHIAL NAVIGATION N/A 04/23/2017   Procedure: VIDEO BRONCHOSCOPY WITH ENDOBRONCHIAL NAVIGATION;  Surgeon: Melrose Nakayama, MD;  Location: [facility];  Service: Thoracic;  Laterality: N/A;  . VIDEO BRONCHOSCOPY WITH ENDOBRONCHIAL ULTRASOUND N/A 04/23/2017   Procedure: VIDEO BRONCHOSCOPY WITH ENDOBRONCHIAL ULTRASOUND;  Surgeon: Melrose Nakayama, MD;  Location: [facility];  Service: Thoracic;  Laterality: N/A;    OB History    No data available       Home Medications    Prior to Admission medications   Medication Sig Start Date End Date Taking? Authorizing Provider  albuterol (PROAIR HFA) 108 (90 BASE) MCG/ACT inhaler Inhale 2 puffs into the lungs every 4 (four) hours as needed for wheezing or shortness of breath.    [provider]  alendronate (FOSAMAX) 70 MG tablet Take 70 mg by mouth every Friday.     [provider]  aspirin EC 81 MG tablet Take 81 mg by mouth daily.    [provider]  Atezolizumab (TECENTRIQ IV) Inject into the vein. Every 3 weeks    [provider]  atorvastatin (LIPITOR) 40 MG tablet Take 40 mg by mouth at bedtime.     [provider]  buPROPion (WELLBUTRIN SR) 150 MG 12 hr tablet Take 150 mg by mouth 2 (two) times daily.    [provider]  cholecalciferol (VITAMIN D) 1000 units tablet Take 1,000 Units by mouth daily.    [provider]  doxycycline (VIBRAMYCIN) 100 MG capsule Take 1 capsule (100 mg total) by mouth 2 (two) times daily. 06/23/17   Orpah Greek, MD  furosemide (LASIX)  40 MG tablet Take 40 mg by mouth daily. Pt is able to take one additional tablet daily for weight increases of 3lbs in a day or 5lbs in a week.    [provider]  gabapentin (NEURONTIN) 300 MG capsule Take 600 mg by mouth at bedtime.     [provider]  Garlic 563 MG TABS Take 300 mg by mouth daily.    [provider]  ipratropium-albuterol (DUONEB) 0.5-2.5 (3) MG/3ML SOLN Inhale 3 mLs into the lungs every 6 (six) hours as needed (for wheezing/shortness of breath).     [provider]  LANTUS SOLOSTAR 100 UNIT/ML Solostar Pen Inject 15 Units into the skin at bedtime.     [provider]  lidocaine-prilocaine (EMLA) cream Apply to affected area once 05/19/17  Brunetta Genera, MD  linagliptin (TRADJENTA) 5 MG TABS tablet Take 5 mg by mouth daily.    [provider]  loratadine (CLARITIN) 10 MG tablet Take 10 mg by mouth daily.    [provider]  losartan (COZAAR) 50 MG tablet Take 50 mg by mouth daily.    [provider]  meclizine (ANTIVERT) 25 MG tablet Take 25 mg by mouth daily.     [provider]  metoprolol succinate (TOPROL-XL) 25 MG 24 hr tablet Take 1 tablet (25 mg total) by mouth daily. 06/26/15   Jettie Booze, MD  mupirocin cream (BACTROBAN) 2 % Apply 1 application topically 2 (two) times daily. 05/15/17   Evalee Jefferson, PA-C  nitroGLYCERIN (NITROSTAT) 0.4 MG SL tablet Place 0.4 mg under the tongue every 5 (five) minutes as needed for chest pain.    [provider]  Omega-3 Fatty Acids (FISH OIL PO) Take 1 capsule by mouth daily.    [provider]  omeprazole (PRILOSEC) 40 MG capsule Take 40 mg by mouth daily.    [provider]  ondansetron (ZOFRAN) 8 MG tablet Take 1 tablet (8 mg total) by mouth 2 (two) times daily as needed (Nausea or vomiting). 05/19/17   Brunetta Genera, MD  polyethylene glycol powder [facility]) powder Take 1 capful daily as needed. 06/01/17    Twana First, MD  potassium chloride (K-DUR) 10 MEQ tablet Take 1 tablet (10 mEq total) by mouth daily. 05/21/16   Isaiah Serge, NP  potassium chloride (K-DUR) 10 MEQ tablet Take 1 tablet (10 mEq total) by mouth daily. *Further refills need to be authorized by PCP* 05/15/17 08/13/17  Isaiah Serge, NP  prochlorperazine (COMPAZINE) 10 MG tablet Take 1 tablet (10 mg total) by mouth every 6 (six) hours as needed (Nausea or vomiting). 05/19/17   Brunetta Genera, MD  pyridoxine (B-6) 100 MG tablet Take 100 mg by mouth daily.     [provider]  rOPINIRole (REQUIP) 0.5 MG tablet Take 0.5 mg by mouth 3 (three) times daily.    [provider]  sodium chloride (OCEAN) 0.65 % SOLN nasal spray Place 1 spray into both nostrils every 3 (three) hours as needed for congestion.     [provider]  traMADol (ULTRAM) 50 MG tablet Take 50 mg by mouth every 12 (twelve) hours as needed for moderate pain.     [provider]  zolpidem (AMBIEN) 5 MG tablet Take 5 mg by mouth at bedtime as needed for sleep.    [provider]    Family History Family History  Problem Relation Age of Onset  . Diabetes Mother   . Hypertension Mother   . Diabetes Father   . Asthma Unknown   . Cancer Unknown   . Heart attack Sister        X2  . Colon cancer Neg Hx     Social History Social History  Substance Use Topics  . Smoking status: Former Smoker    Years: 20.00    Types: Cigarettes    Quit date: 08/13/2006  . Smokeless tobacco: Never Used  . Alcohol use No  lives at home Lives with daughter On oxygen 3 lpm [place]   Allergies   Lisinopril   Review of Systems Review of Systems  All other systems reviewed and are negative.    Physical Exam Updated Vital Signs BP (!) 110/37   Pulse 87   Temp 98 F (36.7 C) (Oral)  Resp 18   Ht 5\' 4"  (1.626 m)   Wt 81.6 kg (180 lb)   SpO2 95%   BMI 30.90 kg/m   Vital signs normal    Physical Exam  Constitutional:  She is oriented to person, place, and time. She appears well-developed and well-nourished.  Non-toxic appearance. She does not appear ill. No distress.  HENT:  Head: Normocephalic and atraumatic.  Right Ear: External ear normal.  Left Ear: External ear normal.  Nose: Nose normal. No mucosal edema or rhinorrhea.  Mouth/Throat: Oropharynx is clear and moist and mucous membranes are normal. No dental abscesses or uvula swelling.  Eyes: Pupils are equal, round, and reactive to light. Conjunctivae and EOM are normal.  Neck: Normal range of motion and full passive range of motion without pain. Neck supple.  Cardiovascular: Normal rate, regular rhythm and normal heart sounds.  Exam reveals no gallop and no friction rub.   No murmur heard. Pulmonary/Chest: Effort normal. Tachypnea noted. No respiratory distress. She has wheezes. She has rhonchi. She has no rales. She exhibits no tenderness and no crepitus.  Abdominal: Soft. Normal appearance and bowel sounds are normal. She exhibits no distension. There is no tenderness. There is no rebound and no guarding.  Musculoskeletal: Normal range of motion. She exhibits no edema or tenderness.  Moves all extremities well.   Neurological: She is alert and oriented to person, place, and time. She has normal strength. No cranial nerve deficit.  Skin: Skin is warm, dry and intact. No rash noted. No erythema. No pallor.  Psychiatric: She has a normal mood and affect. Her speech is normal and behavior is normal. Her mood appears not anxious.  Nursing note and vitals reviewed.    ED Treatments / Results  Labs (all labs ordered are listed, but only abnormal results are displayed) Results for orders placed or performed during the hospital encounter of 07/18/17  Comprehensive metabolic panel  Result Value Ref Range   Sodium 140 135 - 145 mmol/L   Potassium 4.0 3.5 - 5.1 mmol/L   Chloride 101 101 - 111 mmol/L   CO2 29 22 - 32 mmol/L   Glucose, Bld 209 (H) 65 -  99 mg/dL   BUN 19 6 - 20 mg/dL   Creatinine, Ser 1.45 (H) 0.44 - 1.00 mg/dL   Calcium 9.2 8.9 - 10.3 mg/dL   Total Protein 6.8 6.5 - 8.1 g/dL   Albumin 3.6 3.5 - 5.0 g/dL   AST 23 15 - 41 U/L   ALT 16 14 - 54 U/L   Alkaline Phosphatase 56 38 - 126 U/L   Total Bilirubin 0.3 0.3 - 1.2 mg/dL   GFR calc non Af Amer 33 (L) >60 mL/min   GFR calc Af Amer 38 (L) >60 mL/min   Anion gap 10 5 - 15  CBC WITH DIFFERENTIAL  Result Value Ref Range   WBC 8.0 4.0 - 10.5 K/uL   RBC 3.82 (L) 3.87 - 5.11 MIL/uL   Hemoglobin 11.5 (L) 12.0 - 15.0 g/dL   HCT 36.5 36.0 - 46.0 %   MCV 95.5 78.0 - 100.0 fL   MCH 30.1 26.0 - 34.0 pg   MCHC 31.5 30.0 - 36.0 g/dL   RDW 14.4 11.5 - 15.5 %   Platelets 213 150 - 400 K/uL   Neutrophils Relative % 73 %   Neutro Abs 5.8 1.7 - 7.7 K/uL   Lymphocytes Relative 17 %   Lymphs Abs 1.3 0.7 - 4.0 K/uL   Monocytes  Relative 6 %   Monocytes Absolute 0.5 0.1 - 1.0 K/uL   Eosinophils Relative 4 %   Eosinophils Absolute 0.3 0.0 - 0.7 K/uL   Basophils Relative 0 %   Basophils Absolute 0.0 0.0 - 0.1 K/uL  Lactic acid, plasma  Result Value Ref Range   Lactic Acid, Venous 2.9 (HH) 0.5 - 1.9 mmol/L   Laboratory interpretation all normal except elevated LA, renal insufficiency, hyperglycemia, anemia    EKG  EKG Interpretation  Date/Time:  Saturday July 18 2017 23:51:13 EDT Ventricular Rate:  90 PR Interval:    QRS Duration: 85 QT Interval:  377 QTC Calculation: 462 R Axis:   29 Text Interpretation:  Atrial fibrillation Anteroseptal infarct, age indeterminate Lateral leads are also involved Baseline wander No significant change since last tracing 22 Jun 2017 Confirmed by Rolland Porter [phone]) on 07/19/2017 1:27:43 AM       Radiology Dg Chest Port 1 View  Result Date: 07/19/2017 CLINICAL DATA:  83 y/o  F; shortness of breath and cough. EXAM: PORTABLE CHEST 1 VIEW COMPARISON:  06/22/2017 chest radiograph FINDINGS: The mild cardiomegaly. Aortic atherosclerosis with  calcification. Mild pulmonary vascular congestion. Hazy opacity left lung base. No pleural effusion or pneumothorax. Right port catheter tip projects over lower SVC. No acute osseous abnormality is evident. IMPRESSION: Hazy opacification of left lung base may represent atelectasis or pulmonary infiltrate. Pulmonary vascular congestion and cardiomegaly. Electronically Signed   By: Kristine Garbe M.D.   On: 07/19/2017 00:35    Procedures Procedures (including critical care time)  Medications Ordered in ED Medications  azithromycin (ZITHROMAX) 500 mg in dextrose 5 % 250 mL IVPB (500 mg Intravenous New Bag/Given 07/19/17 0208)  albuterol (PROVENTIL) (2.5 MG/3ML) 0.083% nebulizer solution 5 mg (5 mg Nebulization Given 07/19/17 0031)  ipratropium (ATROVENT) nebulizer solution 0.5 mg (0.5 mg Nebulization Given 07/19/17 0031)  cefTRIAXone (ROCEPHIN) 1 g in dextrose 5 % 50 mL IVPB (0 g Intravenous Stopped 07/19/17 0155)     Initial Impression / Assessment and Plan / ED Course  I have reviewed the triage vital signs and the nursing notes.  Pertinent labs & imaging results that were available during my care of the patient were reviewed by me and considered in my medical decision making (see chart for details).    Patient was given albuterol/Atrovent nebulizer treatment. She had already been given steroids by EMS. After reviewing her chest x-ray she was started on IV Rocephin and Zithromax, she is not been admitted in the past 3 months. On reviewing her chart she is getting Tecentriq for her cancer, last dose was 8/2  Recheck at 2 AM patient states she's feeling better. She has finished her IV antibiotics. On reexam her lungs are much improved with increased air movement and less wheezing. We discussed her test results which included concern for pneumonia. Patient also noted to become hypoxic with minimal exertion. I am going to discuss her with the hospitalist for admission.  03:02 AM Dr Shanon Brow,  hospitalist, will admit  Final Clinical Impressions(s) / ED Diagnoses   Final diagnoses:  COPD exacerbation ([place])  Community acquired pneumonia of left lung, unspecified part of lung  Hypoxia    Plan admission  Rolland Porter, MD, Barbette Or, MD 07/19/17 [phone]

## 2017-07-19 NOTE — Progress Notes (Signed)
Subjective: Patient was admitted yesterday due to shortness of breath. She has exacerbation of COPD and and pneumonia. Patient is on Iv antibiotics and feels better today.  Objective: Vital signs in last 24 hours: Temp:  [98 F (36.7 C)-98.7 F (37.1 C)] 98.7 F (37.1 C) (08/12 0425) Pulse Rate:  [87-99] 98 (08/12 0425) Resp:  [15-22] 20 (08/12 0330) BP: (110-140)/(37-109) 140/53 (08/12 0425) SpO2:  [93 %-97 %] 96 % (08/12 0425) Weight:  [81.6 kg (180 lb)-82.1 kg (180 lb 14.4 oz)] 82.1 kg (180 lb 14.4 oz) (08/12 0425) Weight change:  Last BM Date: 07/19/17  Intake/Output from previous day: 08/11 0701 - 08/12 0700 In: 300 [IV Piggyback:300] Out: -   PHYSICAL EXAM General appearance: alert and no distress Resp: diminished breath sounds bilaterally and rhonchi bilaterally Cardio: S1, S2 normal GI: soft, non-tender; bowel sounds normal; no masses,  no organomegaly Extremities: extremities normal, atraumatic, no cyanosis or edema  Lab Results:  Results for orders placed or performed during the hospital encounter of 07/18/17 (from the past 48 hour(s))  Comprehensive metabolic panel     Status: Abnormal   Collection Time: 07/19/17 12:24 AM  Result Value Ref Range   Sodium 140 135 - 145 mmol/L   Potassium 4.0 3.5 - 5.1 mmol/L   Chloride 101 101 - 111 mmol/L   CO2 29 22 - 32 mmol/L   Glucose, Bld 209 (H) 65 - 99 mg/dL   BUN 19 6 - 20 mg/dL   Creatinine, Ser 1.45 (H) 0.44 - 1.00 mg/dL   Calcium 9.2 8.9 - 10.3 mg/dL   Total Protein 6.8 6.5 - 8.1 g/dL   Albumin 3.6 3.5 - 5.0 g/dL   AST 23 15 - 41 U/L   ALT 16 14 - 54 U/L   Alkaline Phosphatase 56 38 - 126 U/L   Total Bilirubin 0.3 0.3 - 1.2 mg/dL   GFR calc non Af Amer 33 (L) >60 mL/min   GFR calc Af Amer 38 (L) >60 mL/min    Comment: (NOTE) The eGFR has been calculated using the CKD EPI equation. This calculation has not been validated in all clinical situations. eGFR's persistently <60 mL/min signify possible Chronic  Kidney Disease.    Anion gap 10 5 - 15  CBC WITH DIFFERENTIAL     Status: Abnormal   Collection Time: 07/19/17 12:24 AM  Result Value Ref Range   WBC 8.0 4.0 - 10.5 K/uL   RBC 3.82 (L) 3.87 - 5.11 MIL/uL   Hemoglobin 11.5 (L) 12.0 - 15.0 g/dL   HCT 36.5 36.0 - 46.0 %   MCV 95.5 78.0 - 100.0 fL   MCH 30.1 26.0 - 34.0 pg   MCHC 31.5 30.0 - 36.0 g/dL   RDW 14.4 11.5 - 15.5 %   Platelets 213 150 - 400 K/uL   Neutrophils Relative % 73 %   Neutro Abs 5.8 1.7 - 7.7 K/uL   Lymphocytes Relative 17 %   Lymphs Abs 1.3 0.7 - 4.0 K/uL   Monocytes Relative 6 %   Monocytes Absolute 0.5 0.1 - 1.0 K/uL   Eosinophils Relative 4 %   Eosinophils Absolute 0.3 0.0 - 0.7 K/uL   Basophils Relative 0 %   Basophils Absolute 0.0 0.0 - 0.1 K/uL  Blood Culture (routine x 2)     Status: None (Preliminary result)   Collection Time: 07/19/17 12:24 AM  Result Value Ref Range   Specimen Description BLOOD    Special Requests NONE    Culture  NO GROWTH < 12 HOURS    Report Status PENDING   Lactic acid, plasma     Status: Abnormal   Collection Time: 07/19/17 12:24 AM  Result Value Ref Range   Lactic Acid, Venous 2.9 (HH) 0.5 - 1.9 mmol/L    Comment: CRITICAL RESULT CALLED TO, READ BACK BY AND VERIFIED WITH:  PRUITT,G @ 0140 ON 07/19/17 BY JUW   Blood Culture (routine x 2)     Status: None (Preliminary result)   Collection Time: 07/19/17 12:31 AM  Result Value Ref Range   Specimen Description BLOOD    Special Requests NONE    Culture NO GROWTH < 12 HOURS    Report Status PENDING   Basic metabolic panel     Status: Abnormal   Collection Time: 07/19/17  6:09 AM  Result Value Ref Range   Sodium 137 135 - 145 mmol/L   Potassium 4.4 3.5 - 5.1 mmol/L   Chloride 98 (L) 101 - 111 mmol/L   CO2 27 22 - 32 mmol/L   Glucose, Bld 310 (H) 65 - 99 mg/dL   BUN 22 (H) 6 - 20 mg/dL   Creatinine, Ser 1.46 (H) 0.44 - 1.00 mg/dL   Calcium 9.1 8.9 - 10.3 mg/dL   GFR calc non Af Amer 33 (L) >60 mL/min   GFR calc Af Amer  38 (L) >60 mL/min    Comment: (NOTE) The eGFR has been calculated using the CKD EPI equation. This calculation has not been validated in all clinical situations. eGFR's persistently <60 mL/min signify possible Chronic Kidney Disease.    Anion gap 12 5 - 15  CBC WITH DIFFERENTIAL     Status: Abnormal   Collection Time: 07/19/17  6:09 AM  Result Value Ref Range   WBC 8.7 4.0 - 10.5 K/uL   RBC 3.81 (L) 3.87 - 5.11 MIL/uL   Hemoglobin 11.4 (L) 12.0 - 15.0 g/dL   HCT 36.3 36.0 - 46.0 %   MCV 95.3 78.0 - 100.0 fL   MCH 29.9 26.0 - 34.0 pg   MCHC 31.4 30.0 - 36.0 g/dL   RDW 14.3 11.5 - 15.5 %   Platelets 228 150 - 400 K/uL   Neutrophils Relative % 94 %   Neutro Abs 8.2 (H) 1.7 - 7.7 K/uL   Lymphocytes Relative 5 %   Lymphs Abs 0.4 (L) 0.7 - 4.0 K/uL   Monocytes Relative 1 %   Monocytes Absolute 0.1 0.1 - 1.0 K/uL   Eosinophils Relative 0 %   Eosinophils Absolute 0.0 0.0 - 0.7 K/uL   Basophils Relative 0 %   Basophils Absolute 0.0 0.0 - 0.1 K/uL  Glucose, capillary     Status: Abnormal   Collection Time: 07/19/17  7:44 AM  Result Value Ref Range   Glucose-Capillary 293 (H) 65 - 99 mg/dL   Comment 1 Notify RN    Comment 2 Document in Chart     ABGS No results for input(s): PHART, PO2ART, TCO2, HCO3 in the last 72 hours.  Invalid input(s): PCO2 CULTURES Recent Results (from the past 240 hour(s))  Blood Culture (routine x 2)     Status: None (Preliminary result)   Collection Time: 07/19/17 12:24 AM  Result Value Ref Range Status   Specimen Description BLOOD  Final   Special Requests NONE  Final   Culture NO GROWTH < 12 HOURS  Final   Report Status PENDING  Incomplete  Blood Culture (routine x 2)     Status: None (Preliminary  result)   Collection Time: 07/19/17 12:31 AM  Result Value Ref Range Status   Specimen Description BLOOD  Final   Special Requests NONE  Final   Culture NO GROWTH < 12 HOURS  Final   Report Status PENDING  Incomplete   Studies/Results: Dg Chest  Port 1 View  Result Date: 07/19/2017 CLINICAL DATA:  83 y/o  F; shortness of breath and cough. EXAM: PORTABLE CHEST 1 VIEW COMPARISON:  06/22/2017 chest radiograph FINDINGS: The mild cardiomegaly. Aortic atherosclerosis with calcification. Mild pulmonary vascular congestion. Hazy opacity left lung base. No pleural effusion or pneumothorax. Right port catheter tip projects over lower SVC. No acute osseous abnormality is evident. IMPRESSION: Hazy opacification of left lung base may represent atelectasis or pulmonary infiltrate. Pulmonary vascular congestion and cardiomegaly. Electronically Signed   By: Kristine Garbe M.D.   On: 07/19/2017 00:35    Medications: I have reviewed the patient's current medications.  Assesment:  Principal Problem:   Community acquired pneumonia of left lung Active Problems:   Adenocarcinoma of lung (H[facility])   CKD (chronic kidney disease) stage 3, GFR 30-59 ml/min   Essential hypertension   Type II diabetes mellitus ([facility])   CHF (congestive heart failure) ([facility])   COPD with acute exacerbation ([facility])   Acute on chronic respiratory failure with hypoxia ([facility])    Plan:  Medications reviewed Will continue Iv antibiotics Continue nebulizer and oxygen therapy Will do pulmonary consult. continue home mediactions    LOS: 0 days   Akasha Melena 07/19/2017, 8:58 AM

## 2017-07-20 LAB — GLUCOSE, CAPILLARY
Glucose-Capillary: 264 mg/dL — ABNORMAL HIGH (ref 65–99)
Glucose-Capillary: 268 mg/dL — ABNORMAL HIGH (ref 65–99)
Glucose-Capillary: 251 mg/dL — ABNORMAL HIGH (ref 65–99)
Glucose-Capillary: 198 mg/dL — ABNORMAL HIGH (ref 65–99)

## 2017-07-20 LAB — STREP PNEUMONIAE URINARY ANTIGEN: Strep Pneumo Urinary Antigen: NEGATIVE

## 2017-07-20 MED ORDER — METHYLPREDNISOLONE SODIUM SUCC 40 MG IJ SOLR
40.0000 mg | Freq: Two times a day (BID) | INTRAMUSCULAR | Status: DC
  Administered 2017-07-21 – 2017-07-22 (×3): 40 mg via INTRAVENOUS
  Filled 2017-07-20 (×3): qty 1

## 2017-07-20 NOTE — Progress Notes (Signed)
Subjective: She says she feels a little bit better but she got choked on her coffee this morning and is coughing significantly. No other new complaints. She's not coughing anything up. She did feel better before this happened  Objective: Vital signs in last 24 hours: Temp:  [98.3 F (36.8 C)-98.6 F (37 C)] 98.3 F (36.8 C) (08/13 0544) Pulse Rate:  [87-89] 87 (08/13 0544) Resp:  [16-20] 16 (08/13 0544) BP: (122-149)/(49-56) 149/56 (08/13 0544) SpO2:  [91 %-99 %] 91 % (08/13 0833) Weight change:  Last BM Date: 07/19/17  Intake/Output from previous day: 08/12 0701 - 08/13 0700 In: 290 [P.O.:240; IV Piggyback:50] Out: -   PHYSICAL EXAM General appearance: alert, cooperative and moderate distress Resp: clear to auscultation bilaterally Cardio: regular rate and rhythm, S1, S2 normal, no murmur, click, rub or gallop GI: soft, non-tender; bowel sounds normal; no masses,  no organomegaly Extremities: extremities normal, atraumatic, no cyanosis or edema Skin warm and dry  Lab Results:  Results for orders placed or performed during the hospital encounter of 07/18/17 (from the past 48 hour(s))  Comprehensive metabolic panel     Status: Abnormal   Collection Time: 07/19/17 12:24 AM  Result Value Ref Range   Sodium 140 135 - 145 mmol/[place]   Potassium 4.0 3.5 - 5.1 mmol/[place]son   Chloride 101 101 - 111 mmol/[place]   CO2 29 22 - 32 mmol/[place]   Glucose, Bld 209 (H) 65 - 99 mg/dL   BUN 19 6 - 20 mg/dL   Creatinine, Ser 1.45 (H) 0.44 - 1.00 mg/dL   Calcium 9.2 8.9 - 10.3 mg/dL   Total Protein 6.8 6.5 - 8.1 g/dL   Albumin 3.6 3.5 - 5.0 g/dL   AST 23 15 - 41 U/[place]   ALT 16 14 - 54 U/[place]   Alkaline Phosphatase 56 38 - 126 U/[place]   Total Bilirubin 0.3 0.3 - 1.2 mg/dL   GFR calc non Af Amer 33 ([place]son) >60 mL/min   GFR calc Af Amer 38 ([place]) >60 mL/min    Comment: (NOTE) The eGFR has been calculated using the CKD EPI equation. This calculation has not been validated in all clinical situations. eGFR's persistently  <60 mL/min signify possible Chronic Kidney Disease.    Anion gap 10 5 - 15  CBC WITH DIFFERENTIAL     Status: Abnormal   Collection Time: 07/19/17 12:24 AM  Result Value Ref Range   WBC 8.0 4.0 - 10.5 K/uL   RBC 3.82 ([place]son) 3.87 - 5.11 MIL/uL   Hemoglobin 11.5 ([place]son) 12.0 - 15.0 g/dL   HCT 36.5 36.0 - 46.0 %   MCV 95.5 78.0 - 100.0 fL   MCH 30.1 26.0 - 34.0 pg   MCHC 31.5 30.0 - 36.0 g/dL   RDW 14.4 11.5 - 15.5 %   Platelets 213 150 - 400 K/uL   Neutrophils Relative % 73 %   Neutro Abs 5.8 1.7 - 7.7 K/uL   Lymphocytes Relative 17 %   Lymphs Abs 1.3 0.7 - 4.0 K/uL   Monocytes Relative 6 %   Monocytes Absolute 0.5 0.1 - 1.0 K/uL   Eosinophils Relative 4 %   Eosinophils Absolute 0.3 0.0 - 0.7 K/uL   Basophils Relative 0 %   Basophils Absolute 0.0 0.0 - 0.1 K/uL  Blood Culture (routine x 2)     Status: None (Preliminary result)   Collection Time: 07/19/17 12:24 AM  Result Value Ref Range   Specimen Description RIGHT ANTECUBITAL    Special  Requests Blood Culture adequate volume    Culture NO GROWTH 1 DAY    Report Status PENDING   Lactic acid, plasma     Status: Abnormal   Collection Time: 07/19/17 12:24 AM  Result Value Ref Range   Lactic Acid, Venous 2.9 (HH) 0.5 - 1.9 mmol/[place]son    Comment: CRITICAL RESULT CALLED TO, READ BACK BY AND VERIFIED WITH:  PRUITT,G @ 0140 ON 07/19/17 BY JUW   Blood Culture (routine x 2)     Status: None (Preliminary result)   Collection Time: 07/19/17 12:31 AM  Result Value Ref Range   Specimen Description BLOOD RIGHT HAND    Special Requests Blood Culture adequate volume    Culture NO GROWTH 1 DAY    Report Status PENDING   Basic metabolic panel     Status: Abnormal   Collection Time: 07/19/17  6:09 AM  Result Value Ref Range   Sodium 137 135 - 145 mmol/[place]   Potassium 4.4 3.5 - 5.1 mmol/[place]   Chloride 98 ([place]son) 101 - 111 mmol/[facility]son   CO2 27 22 - 32 mmol/[place]   Glucose, Bld 310 (H) 65 - 99 mg/dL   BUN 22 (H) 6 - 20 mg/dL   Creatinine, Ser 1.46 (H) 0.44 - 1.00  mg/dL   Calcium 9.1 8.9 - 10.3 mg/dL   GFR calc non Af Amer 33 ([place]son) >60 mL/min   GFR calc Af Amer 38 ([place]) >60 mL/min    Comment: (NOTE) The eGFR has been calculated using the CKD EPI equation. This calculation has not been validated in all clinical situations. eGFR's persistently <60 mL/min signify possible Chronic Kidney Disease.    Anion gap 12 5 - 15  CBC WITH DIFFERENTIAL     Status: Abnormal   Collection Time: 07/19/17  6:09 AM  Result Value Ref Range   WBC 8.7 4.0 - 10.5 K/uL   RBC 3.81 ([place]) 3.87 - 5.11 MIL/uL   Hemoglobin 11.4 ([place]son) 12.0 - 15.0 g/dL   HCT 36.3 36.0 - 46.0 %   MCV 95.3 78.0 - 100.0 fL   MCH 29.9 26.0 - 34.0 pg   MCHC 31.4 30.0 - 36.0 g/dL   RDW 14.3 11.5 - 15.5 %   Platelets 228 150 - 400 K/uL   Neutrophils Relative % 94 %   Neutro Abs 8.2 (H) 1.7 - 7.7 K/uL   Lymphocytes Relative 5 %   Lymphs Abs 0.4 ([place]) 0.7 - 4.0 K/uL   Monocytes Relative 1 %   Monocytes Absolute 0.1 0.1 - 1.0 K/uL   Eosinophils Relative 0 %   Eosinophils Absolute 0.0 0.0 - 0.7 K/uL   Basophils Relative 0 %   Basophils Absolute 0.0 0.0 - 0.1 K/uL  Glucose, capillary     Status: Abnormal   Collection Time: 07/19/17  7:44 AM  Result Value Ref Range   Glucose-Capillary 293 (H) 65 - 99 mg/dL   Comment 1 Notify RN    Comment 2 Document in Chart   Glucose, capillary     Status: Abnormal   Collection Time: 07/19/17 11:33 AM  Result Value Ref Range   Glucose-Capillary 275 (H) 65 - 99 mg/dL   Comment 1 Notify RN    Comment 2 Document in Chart   Glucose, capillary     Status: Abnormal   Collection Time: 07/19/17  4:18 PM  Result Value Ref Range   Glucose-Capillary 230 (H) 65 - 99 mg/dL   Comment 1 Notify RN    Comment 2 Document  in Chart   Glucose, capillary     Status: Abnormal   Collection Time: 07/19/17  9:19 PM  Result Value Ref Range   Glucose-Capillary 215 (H) 65 - 99 mg/dL   Comment 1 Notify RN    Comment 2 Document in Chart   Glucose, capillary     Status: Abnormal    Collection Time: 07/20/17  7:29 AM  Result Value Ref Range   Glucose-Capillary 264 (H) 65 - 99 mg/dL    ABGS No results for input(s): PHART, PO2ART, TCO2, HCO3 in the last 72 hours.  Invalid input(s): PCO2 CULTURES Recent Results (from the past 240 hour(s))  Blood Culture (routine x 2)     Status: None (Preliminary result)   Collection Time: 07/19/17 12:24 AM  Result Value Ref Range Status   Specimen Description RIGHT ANTECUBITAL  Final   Special Requests Blood Culture adequate volume  Final   Culture NO GROWTH 1 DAY  Final   Report Status PENDING  Incomplete  Blood Culture (routine x 2)     Status: None (Preliminary result)   Collection Time: 07/19/17 12:31 AM  Result Value Ref Range Status   Specimen Description BLOOD RIGHT HAND  Final   Special Requests Blood Culture adequate volume  Final   Culture NO GROWTH 1 DAY  Final   Report Status PENDING  Incomplete   Studies/Results: Dg Chest Port 1 View  Result Date: 07/19/2017 CLINICAL DATA:  83 y/o  F; shortness of breath and cough. EXAM: PORTABLE CHEST 1 VIEW COMPARISON:  06/22/2017 chest radiograph FINDINGS: The mild cardiomegaly. Aortic atherosclerosis with calcification. Mild pulmonary vascular congestion. Hazy opacity left lung base. No pleural effusion or pneumothorax. Right port catheter tip projects over lower SVC. No acute osseous abnormality is evident. IMPRESSION: Hazy opacification of left lung base may represent atelectasis or pulmonary infiltrate. Pulmonary vascular congestion and cardiomegaly. Electronically Signed   By: Kristine Garbe M.D.   On: 07/19/2017 00:35    Medications:  Prior to Admission:  Prescriptions Prior to Admission  Medication Sig Dispense Refill Last Dose  . albuterol (PROAIR HFA) 108 (90 BASE) MCG/ACT inhaler Inhale 2 puffs into the lungs every 4 (four) hours as needed for wheezing or shortness of breath.   unknown  . alendronate (FOSAMAX) 70 MG tablet Take 70 mg by mouth every Friday.     Past Week at Unknown time  . aspirin EC 81 MG tablet Take 81 mg by mouth daily.   07/18/2017 at Unknown time  . Atezolizumab (TECENTRIQ IV) Inject into the vein. Every 3 weeks   Past Month at Unknown time  . atorvastatin (LIPITOR) 40 MG tablet Take 40 mg by mouth at bedtime.    07/10/2017 at Unknown time  . buPROPion (WELLBUTRIN SR) 150 MG 12 hr tablet Take 150 mg by mouth 2 (two) times daily.   07/18/2017 at Unknown time  . cholecalciferol (VITAMIN D) 1000 units tablet Take 1,000 Units by mouth daily.   07/18/2017 at Unknown time  . furosemide (LASIX) 40 MG tablet Take 40 mg by mouth daily. Pt is able to take one additional tablet daily for weight increases of 3lbs in a day or 5lbs in a week.   07/18/2017 at Unknown time  . gabapentin (NEURONTIN) 300 MG capsule Take 600 mg by mouth at bedtime.    07/10/2017 at Unknown time  . Garlic 710 MG TABS Take 300 mg by mouth daily.   07/18/2017 at Unknown time  . ipratropium-albuterol (DUONEB) 0.5-2.5 (3) MG/3ML  SOLN Inhale 3 mLs into the lungs every 6 (six) hours as needed (for wheezing/shortness of breath).    Past Week at Unknown time  . LANTUS SOLOSTAR 100 UNIT/ML Solostar Pen Inject 15 Units into the skin at bedtime.    07/17/2017 at Unknown time  . lidocaine-prilocaine (EMLA) cream Apply to affected area once 30 g 3 unknown  . linagliptin (TRADJENTA) 5 MG TABS tablet Take 5 mg by mouth daily.   07/18/2017 at Unknown time  . loratadine (CLARITIN) 10 MG tablet Take 10 mg by mouth daily.  3 07/18/2017 at Unknown time  . losartan (COZAAR) 50 MG tablet Take 50 mg by mouth daily.  2 07/18/2017 at Unknown time  . meclizine (ANTIVERT) 25 MG tablet Take 25 mg by mouth daily.    07/18/2017 at Unknown time  . metoprolol succinate (TOPROL-XL) 25 MG 24 hr tablet Take 1 tablet (25 mg total) by mouth daily. 90 tablet 3 07/18/2017 at 1000  . nitroGLYCERIN (NITROSTAT) 0.4 MG SL tablet Place 0.4 mg under the tongue every 5 (five) minutes as needed for chest pain.   unknown  .  Omega-3 Fatty Acids (FISH OIL PO) Take 1 capsule by mouth daily.   07/18/2017 at Unknown time  . omeprazole (PRILOSEC) 40 MG capsule Take 40 mg by mouth daily.   07/18/2017 at Unknown time  . ondansetron (ZOFRAN) 8 MG tablet Take 1 tablet (8 mg total) by mouth 2 (two) times daily as needed (Nausea or vomiting). 30 tablet 1 unknown  . polyethylene glycol powder (GLYCOLAX/MIRALAX) powder Take 1 capful daily as needed. 255 g 2 unknown  . potassium chloride (K-DUR) 10 MEQ tablet Take 1 tablet (10 mEq total) by mouth daily. 90 tablet 3 07/18/2017 at Unknown time  . prochlorperazine (COMPAZINE) 10 MG tablet Take 1 tablet (10 mg total) by mouth every 6 (six) hours as needed (Nausea or vomiting). 30 tablet 1 unknown  . pyridoxine (B-6) 100 MG tablet Take 100 mg by mouth daily.    07/18/2017 at Unknown time  . rOPINIRole (REQUIP) 0.5 MG tablet Take 0.5 mg by mouth 3 (three) times daily.   07/18/2017 at Unknown time  . sodium chloride (OCEAN) 0.65 % SOLN nasal spray Place 1 spray into both nostrils every 3 (three) hours as needed for congestion.    unknown  . traMADol (ULTRAM) 50 MG tablet Take 50 mg by mouth every 12 (twelve) hours as needed for moderate pain.    unknown  . zolpidem (AMBIEN) 5 MG tablet Take 5 mg by mouth at bedtime as needed for sleep.   unknown   Scheduled: . aspirin EC  81 mg Oral Daily  . furosemide  40 mg Oral Daily  . guaiFENesin  1,200 mg Oral BID  . insulin aspart  0-9 Units Subcutaneous TID WC  . ipratropium-albuterol  3 mL Inhalation Q6H WA  . mouth rinse  15 mL Mouth Rinse BID  . methylPREDNISolone (SOLU-MEDROL) injection  80 mg Intravenous Q12H  . metoprolol succinate  25 mg Oral Daily  . sodium chloride flush  3 mL Intravenous Q12H   Continuous: . sodium chloride    . azithromycin Stopped (07/20/17 0400)  . cefTRIAXone (ROCEPHIN)  IV Stopped (07/19/17 1445)   IWP:YKDXIP chloride, albuterol, sodium chloride flush  Assesment:She has community-acquired pneumonia. She has  COPD exacerbation. She has acute on chronic hypoxic respiratory failure. She got choked on her coffee this morning but had been improving Principal Problem:   Community acquired pneumonia of left lung  Active Problems:   Adenocarcinoma of lung (HCC)   CKD (chronic kidney disease) stage 3, GFR 30-59 ml/min   Essential hypertension   Type II diabetes mellitus (HCC)   CHF (congestive heart failure) (HCC)   COPD with acute exacerbation (HCC)   Acute on chronic respiratory failure with hypoxia ([place])    Plan: Continue current treatments    LOS: 1 day   Angela Lawson 07/20/2017, 8:59 AM

## 2017-07-20 NOTE — Progress Notes (Signed)
Subjective: Patient feels better today. Her cough and congestion is less. No fever or chills. Objective: Vital signs in last 24 hours: Temp:  [98.3 F (36.8 C)-98.6 F (37 C)] 98.3 F (36.8 C) (08/13 0544) Pulse Rate:  [87-89] 87 (08/13 0544) Resp:  [16-20] 16 (08/13 0544) BP: (122-149)/(49-56) 149/56 (08/13 0544) SpO2:  [91 %-99 %] 91 % (08/13 0833) Weight change:  Last BM Date: 07/19/17  Intake/Output from previous day: 08/12 0701 - 08/13 0700 In: 290 [P.O.:240; IV Piggyback:50] Out: -   PHYSICAL EXAM General appearance: alert and no distress Resp: diminished breath sounds bilaterally and rhonchi bilaterally Cardio: S1, S2 normal GI: soft, non-tender; bowel sounds normal; no masses,  no organomegaly Extremities: extremities normal, atraumatic, no cyanosis or edema  Lab Results:  Results for orders placed or performed during the hospital encounter of 07/18/17 (from the past 48 hour(s))  Comprehensive metabolic panel     Status: Abnormal   Collection Time: 07/19/17 12:24 AM  Result Value Ref Range   Sodium 140 135 - 145 mmol/L   Potassium 4.0 3.5 - 5.1 mmol/L   Chloride 101 101 - 111 mmol/L   CO2 29 22 - 32 mmol/L   Glucose, Bld 209 (H) 65 - 99 mg/dL   BUN 19 6 - 20 mg/dL   Creatinine, Ser 1.45 (H) 0.44 - 1.00 mg/dL   Calcium 9.2 8.9 - 10.3 mg/dL   Total Protein 6.8 6.5 - 8.1 g/dL   Albumin 3.6 3.5 - 5.0 g/dL   AST 23 15 - 41 U/L   ALT 16 14 - 54 U/L   Alkaline Phosphatase 56 38 - 126 U/L   Total Bilirubin 0.3 0.3 - 1.2 mg/dL   GFR calc non Af Amer 33 (L) >60 mL/min   GFR calc Af Amer 38 (L) >60 mL/min    Comment: (NOTE) The eGFR has been calculated using the CKD EPI equation. This calculation has not been validated in all clinical situations. eGFR's persistently <60 mL/min signify possible Chronic Kidney Disease.    Anion gap 10 5 - 15  CBC WITH DIFFERENTIAL     Status: Abnormal   Collection Time: 07/19/17 12:24 AM  Result Value Ref Range   WBC 8.0 4.0 -  10.5 K/uL   RBC 3.82 (L) 3.87 - 5.11 MIL/uL   Hemoglobin 11.5 (L) 12.0 - 15.0 g/dL   HCT 36.5 36.0 - 46.0 %   MCV 95.5 78.0 - 100.0 fL   MCH 30.1 26.0 - 34.0 pg   MCHC 31.5 30.0 - 36.0 g/dL   RDW 14.4 11.5 - 15.5 %   Platelets 213 150 - 400 K/uL   Neutrophils Relative % 73 %   Neutro Abs 5.8 1.7 - 7.7 K/uL   Lymphocytes Relative 17 %   Lymphs Abs 1.3 0.7 - 4.0 K/uL   Monocytes Relative 6 %   Monocytes Absolute 0.5 0.1 - 1.0 K/uL   Eosinophils Relative 4 %   Eosinophils Absolute 0.3 0.0 - 0.7 K/uL   Basophils Relative 0 %   Basophils Absolute 0.0 0.0 - 0.1 K/uL  Blood Culture (routine x 2)     Status: None (Preliminary result)   Collection Time: 07/19/17 12:24 AM  Result Value Ref Range   Specimen Description RIGHT ANTECUBITAL    Special Requests Blood Culture adequate volume    Culture NO GROWTH 1 DAY    Report Status PENDING   Lactic acid, plasma     Status: Abnormal   Collection Time: 07/19/17 12:24  AM  Result Value Ref Range   Lactic Acid, Venous 2.9 (HH) 0.5 - 1.9 mmol/L    Comment: CRITICAL RESULT CALLED TO, READ BACK BY AND VERIFIED WITH:  PRUITT,G @ 0140 ON 07/19/17 BY JUW   Blood Culture (routine x 2)     Status: None (Preliminary result)   Collection Time: 07/19/17 12:31 AM  Result Value Ref Range   Specimen Description BLOOD RIGHT HAND    Special Requests Blood Culture adequate volume    Culture NO GROWTH 1 DAY    Report Status PENDING   Basic metabolic panel     Status: Abnormal   Collection Time: 07/19/17  6:09 AM  Result Value Ref Range   Sodium 137 135 - 145 mmol/L   Potassium 4.4 3.5 - 5.1 mmol/L   Chloride 98 (L) 101 - 111 mmol/L   CO2 27 22 - 32 mmol/L   Glucose, Bld 310 (H) 65 - 99 mg/dL   BUN 22 (H) 6 - 20 mg/dL   Creatinine, Ser 1.46 (H) 0.44 - 1.00 mg/dL   Calcium 9.1 8.9 - 10.3 mg/dL   GFR calc non Af Amer 33 (L) >60 mL/min   GFR calc Af Amer 38 (L) >60 mL/min    Comment: (NOTE) The eGFR has been calculated using the CKD EPI equation. This  calculation has not been validated in all clinical situations. eGFR's persistently <60 mL/min signify possible Chronic Kidney Disease.    Anion gap 12 5 - 15  CBC WITH DIFFERENTIAL     Status: Abnormal   Collection Time: 07/19/17  6:09 AM  Result Value Ref Range   WBC 8.7 4.0 - 10.5 K/uL   RBC 3.81 (L) 3.87 - 5.11 MIL/uL   Hemoglobin 11.4 (L) 12.0 - 15.0 g/dL   HCT 36.3 36.0 - 46.0 %   MCV 95.3 78.0 - 100.0 fL   MCH 29.9 26.0 - 34.0 pg   MCHC 31.4 30.0 - 36.0 g/dL   RDW 14.3 11.5 - 15.5 %   Platelets 228 150 - 400 K/uL   Neutrophils Relative % 94 %   Neutro Abs 8.2 (H) 1.7 - 7.7 K/uL   Lymphocytes Relative 5 %   Lymphs Abs 0.4 (L) 0.7 - 4.0 K/uL   Monocytes Relative 1 %   Monocytes Absolute 0.1 0.1 - 1.0 K/uL   Eosinophils Relative 0 %   Eosinophils Absolute 0.0 0.0 - 0.7 K/uL   Basophils Relative 0 %   Basophils Absolute 0.0 0.0 - 0.1 K/uL  Glucose, capillary     Status: Abnormal   Collection Time: 07/19/17  7:44 AM  Result Value Ref Range   Glucose-Capillary 293 (H) 65 - 99 mg/dL   Comment 1 Notify RN    Comment 2 Document in Chart   Glucose, capillary     Status: Abnormal   Collection Time: 07/19/17 11:33 AM  Result Value Ref Range   Glucose-Capillary 275 (H) 65 - 99 mg/dL   Comment 1 Notify RN    Comment 2 Document in Chart   Glucose, capillary     Status: Abnormal   Collection Time: 07/19/17  4:18 PM  Result Value Ref Range   Glucose-Capillary 230 (H) 65 - 99 mg/dL   Comment 1 Notify RN    Comment 2 Document in Chart   Glucose, capillary     Status: Abnormal   Collection Time: 07/19/17  9:19 PM  Result Value Ref Range   Glucose-Capillary 215 (H) 65 - 99 mg/dL  Comment 1 Notify RN    Comment 2 Document in Chart   Strep pneumoniae urinary antigen     Status: None   Collection Time: 07/19/17  9:23 PM  Result Value Ref Range   Strep Pneumo Urinary Antigen NEGATIVE NEGATIVE    Comment:        Infection due to S. pneumoniae cannot be absolutely ruled  out since the antigen present may be below the detection limit of the test.   Glucose, capillary     Status: Abnormal   Collection Time: 07/20/17  7:29 AM  Result Value Ref Range   Glucose-Capillary 264 (H) 65 - 99 mg/dL  Glucose, capillary     Status: Abnormal   Collection Time: 07/20/17 11:13 AM  Result Value Ref Range   Glucose-Capillary 268 (H) 65 - 99 mg/dL    ABGS No results for input(s): PHART, PO2ART, TCO2, HCO3 in the last 72 hours.  Invalid input(s): PCO2 CULTURES Recent Results (from the past 240 hour(s))  Blood Culture (routine x 2)     Status: None (Preliminary result)   Collection Time: 07/19/17 12:24 AM  Result Value Ref Range Status   Specimen Description RIGHT ANTECUBITAL  Final   Special Requests Blood Culture adequate volume  Final   Culture NO GROWTH 1 DAY  Final   Report Status PENDING  Incomplete  Blood Culture (routine x 2)     Status: None (Preliminary result)   Collection Time: 07/19/17 12:31 AM  Result Value Ref Range Status   Specimen Description BLOOD RIGHT HAND  Final   Special Requests Blood Culture adequate volume  Final   Culture NO GROWTH 1 DAY  Final   Report Status PENDING  Incomplete   Studies/Results: Dg Chest Port 1 View  Result Date: 07/19/2017 CLINICAL DATA:  83 y/o  F; shortness of breath and cough. EXAM: PORTABLE CHEST 1 VIEW COMPARISON:  06/22/2017 chest radiograph FINDINGS: The mild cardiomegaly. Aortic atherosclerosis with calcification. Mild pulmonary vascular congestion. Hazy opacity left lung base. No pleural effusion or pneumothorax. Right port catheter tip projects over lower SVC. No acute osseous abnormality is evident. IMPRESSION: Hazy opacification of left lung base may represent atelectasis or pulmonary infiltrate. Pulmonary vascular congestion and cardiomegaly. Electronically Signed   By: Kristine Garbe M.D.   On: 07/19/2017 00:35    Medications: I have reviewed the patient's current medications.  Assesment:   Principal Problem:   Community acquired pneumonia of left lung Active Problems:   Adenocarcinoma of lung ([place])   CKD (chronic kidney disease) stage 3, GFR 30-59 ml/min   Essential hypertension   Type II diabetes mellitus (HCC)   CHF (congestive heart failure) (HCC)   COPD with acute exacerbation (HCC)   Acute on chronic respiratory failure with hypoxia (HCC)    Plan:  Medications reviewed Will continue Iv antibiotics Continue nebulizer and oxygen therapy pulmonary consult appreciated Will adjust steroid dose    LOS: 1 day   Jesenya Bowditch 07/20/2017, 1:06 PM

## 2017-07-20 NOTE — Progress Notes (Addendum)
Inpatient Diabetes Program Recommendations  AACE/ADA: New Consensus Statement on Inpatient Glycemic Control (2015)  Target Ranges:  Prepandial:   less than 140 mg/dL      Peak postprandial:   less than 180 mg/dL (1-2 hours)      Critically ill patients:  140 - 180 mg/dL   Results for ESTHER, BRADSTREET (MRN [id number]) as of 07/20/2017 08:03  Ref. Range 07/19/2017 07:44 07/19/2017 11:33 07/19/2017 16:18 07/19/2017 21:19 07/20/2017 07:29  Glucose-Capillary Latest Ref Range: 65 - 99 mg/dL 293 (H) 275 (H) 230 (H) 215 (H) 264 (H)   Review of Glycemic Control  Diabetes history: DM2 Outpatient Diabetes medications:Lantus 15 QHS, Tradjenta 5 mg daily Current orders for Inpatient glycemic control: Novolog 0-9 units TID with meals  Inpatient Diabetes Program Recommendations: Insulin - Basal: Please consider ordering Lantus 8 units Q24H (based on 82 kg x 0.1 units). Correction (SSI): Please consider ordering Novolog 0-5 units QHS for bedtime correction.  Thanks, Barnie Alderman, RN, MSN, CDE Diabetes Coordinator Inpatient Diabetes Program [phone] (Team Pager from 8am to 5pm)

## 2017-07-21 LAB — BASIC METABOLIC PANEL
Anion gap: 9 (ref 5–15)
BUN: 33 mg/dL — ABNORMAL HIGH (ref 6–20)
CO2: 30 mmol/L (ref 22–32)
Calcium: 9.4 mg/dL (ref 8.9–10.3)
Chloride: 98 mmol/L — ABNORMAL LOW (ref 101–111)
Creatinine, Ser: 1.51 mg/dL — ABNORMAL HIGH (ref 0.44–1.00)
GFR calc Af Amer: 36 mL/min — ABNORMAL LOW (ref >60)
GFR calc non Af Amer: 31 mL/min — ABNORMAL LOW (ref >60)
Glucose, Bld: 166 mg/dL — ABNORMAL HIGH (ref 65–99)
Potassium: 4.4 mmol/L (ref 3.5–5.1)
Sodium: 137 mmol/L (ref 135–145)

## 2017-07-21 LAB — CBC
HCT: 39.5 % (ref 36.0–46.0)
Hemoglobin: 12.6 g/dL (ref 12.0–15.0)
MCH: 29.7 pg (ref 26.0–34.0)
MCHC: 31.9 g/dL (ref 30.0–36.0)
MCV: 93.2 fL (ref 78.0–100.0)
Platelets: 268 10*3/uL (ref 150–400)
RBC: 4.24 MIL/uL (ref 3.87–5.11)
RDW: 14.3 % (ref 11.5–15.5)
WBC: 17.5 10*3/uL — ABNORMAL HIGH (ref 4.0–10.5)

## 2017-07-21 LAB — GLUCOSE, CAPILLARY
Glucose-Capillary: 190 mg/dL — ABNORMAL HIGH (ref 65–99)
Glucose-Capillary: 274 mg/dL — ABNORMAL HIGH (ref 65–99)
Glucose-Capillary: 259 mg/dL — ABNORMAL HIGH (ref 65–99)
Glucose-Capillary: 242 mg/dL — ABNORMAL HIGH (ref 65–99)

## 2017-07-21 MED ORDER — ROPINIROLE HCL 0.25 MG PO TABS
0.5000 mg | ORAL_TABLET | Freq: Three times a day (TID) | ORAL | Status: DC
  Administered 2017-07-21 – 2017-07-22 (×4): 0.5 mg via ORAL
  Filled 2017-07-21 (×4): qty 2

## 2017-07-21 MED ORDER — ZOLPIDEM TARTRATE 5 MG PO TABS
5.0000 mg | ORAL_TABLET | Freq: Every evening | ORAL | Status: DC | PRN
  Administered 2017-07-21: 5 mg via ORAL
  Filled 2017-07-21: qty 1

## 2017-07-21 NOTE — Progress Notes (Signed)
Subjective: She says she feels better than yesterday. She is still coughing and congested. Agree leukocytosis probably from steroids. She's not coughing much up.  Objective: Vital signs in last 24 hours: Temp:  [97.7 F (36.5 C)-97.8 F (36.6 C)] 97.7 F (36.5 C) (08/14 0616) Pulse Rate:  [72-91] 72 (08/14 0616) Resp:  [16-20] 20 (08/14 0616) BP: (140-170)/(58-81) 170/81 (08/14 0616) SpO2:  [88 %-100 %] 100 % (08/14 0616) Weight change:  Last BM Date: 07/20/17  Intake/Output from previous day: 08/13 0701 - 08/14 0700 In: 720 [P.O.:720] Out: 650 [Urine:650]  PHYSICAL EXAM General appearance: alert, cooperative and no distress Resp: clear to auscultation bilaterally Cardio: regular rate and rhythm, S1, S2 normal, no murmur, click, rub or gallop GI: soft, non-tender; bowel sounds normal; no masses,  no organomegaly Extremities: extremities normal, atraumatic, no cyanosis or edema Skin warm and dry  Lab Results:  Results for orders placed or performed during the hospital encounter of 07/18/17 (from the past 48 hour(s))  Glucose, capillary     Status: Abnormal   Collection Time: 07/19/17 11:33 AM  Result Value Ref Range   Glucose-Capillary 275 (H) 65 - 99 mg/dL   Comment 1 Notify RN    Comment 2 Document in Chart   Glucose, capillary     Status: Abnormal   Collection Time: 07/19/17  4:18 PM  Result Value Ref Range   Glucose-Capillary 230 (H) 65 - 99 mg/dL   Comment 1 Notify RN    Comment 2 Document in Chart   Glucose, capillary     Status: Abnormal   Collection Time: 07/19/17  9:19 PM  Result Value Ref Range   Glucose-Capillary 215 (H) 65 - 99 mg/dL   Comment 1 Notify RN    Comment 2 Document in Chart   Strep pneumoniae urinary antigen     Status: None   Collection Time: 07/19/17  9:23 PM  Result Value Ref Range   Strep Pneumo Urinary Antigen NEGATIVE NEGATIVE    Comment:        Infection due to S. pneumoniae cannot be absolutely ruled out since the antigen  present may be below the detection limit of the test.   Culture, respiratory (NON-Expectorated)     Status: None (Preliminary result)   Collection Time: 07/19/17 11:45 PM  Result Value Ref Range   Specimen Description SPUTUM    Special Requests NONE    Gram Stain      MODERATE WBC PRESENT, PREDOMINANTLY PMN FEW GRAM POSITIVE COCCI RARE GRAM POSITIVE RODS Performed at [facility], [address], [place], [place] [address]    Culture PENDING    Report Status PENDING   Glucose, capillary     Status: Abnormal   Collection Time: 07/20/17  7:29 AM  Result Value Ref Range   Glucose-Capillary 264 (H) 65 - 99 mg/dL  Glucose, capillary     Status: Abnormal   Collection Time: 07/20/17 11:13 AM  Result Value Ref Range   Glucose-Capillary 268 (H) 65 - 99 mg/dL  Glucose, capillary     Status: Abnormal   Collection Time: 07/20/17  4:29 PM  Result Value Ref Range   Glucose-Capillary 251 (H) 65 - 99 mg/dL  Glucose, capillary     Status: Abnormal   Collection Time: 07/20/17 10:37 PM  Result Value Ref Range   Glucose-Capillary 198 (H) 65 - 99 mg/dL   Comment 1 Notify RN    Comment 2 Document in Chart   CBC  Status: Abnormal   Collection Time: 07/21/17  6:33 AM  Result Value Ref Range   WBC 17.5 (H) 4.0 - 10.5 K/uL   RBC 4.24 3.87 - 5.11 MIL/uL   Hemoglobin 12.6 12.0 - 15.0 g/dL   HCT 39.5 36.0 - 46.0 %   MCV 93.2 78.0 - 100.0 fL   MCH 29.7 26.0 - 34.0 pg   MCHC 31.9 30.0 - 36.0 g/dL   RDW 14.3 11.5 - 15.5 %   Platelets 268 150 - 400 K/uL  Basic metabolic panel     Status: Abnormal   Collection Time: 07/21/17  6:33 AM  Result Value Ref Range   Sodium 137 135 - 145 mmol/L   Potassium 4.4 3.5 - 5.1 mmol/L   Chloride 98 (L) 101 - 111 mmol/L   CO2 30 22 - 32 mmol/L   Glucose, Bld 166 (H) 65 - 99 mg/dL   BUN 33 (H) 6 - 20 mg/dL   Creatinine, Ser 1.51 (H) 0.44 - 1.00 mg/dL   Calcium 9.4 8.9 - 10.3 mg/dL   GFR calc non Af Amer 31 (L) >60 mL/min   GFR calc Af Amer 36 (L) >60  mL/min    Comment: (NOTE) The eGFR has been calculated using the CKD EPI equation. This calculation has not been validated in all clinical situations. eGFR's persistently <60 mL/min signify possible Chronic Kidney Disease.    Anion gap 9 5 - 15  Glucose, capillary     Status: Abnormal   Collection Time: 07/21/17  7:42 AM  Result Value Ref Range   Glucose-Capillary 190 (H) 65 - 99 mg/dL   Comment 1 Notify RN    Comment 2 Document in Chart     ABGS No results for input(s): PHART, PO2ART, TCO2, HCO3 in the last 72 hours.  Invalid input(s): PCO2 CULTURES Recent Results (from the past 240 hour(s))  Blood Culture (routine x 2)     Status: None (Preliminary result)   Collection Time: 07/19/17 12:24 AM  Result Value Ref Range Status   Specimen Description RIGHT ANTECUBITAL  Final   Special Requests Blood Culture adequate volume  Final   Culture NO GROWTH 2 DAYS  Final   Report Status PENDING  Incomplete  Blood Culture (routine x 2)     Status: None (Preliminary result)   Collection Time: 07/19/17 12:31 AM  Result Value Ref Range Status   Specimen Description BLOOD RIGHT HAND  Final   Special Requests Blood Culture adequate volume  Final   Culture NO GROWTH 2 DAYS  Final   Report Status PENDING  Incomplete  Culture, respiratory (NON-Expectorated)     Status: None (Preliminary result)   Collection Time: 07/19/17 11:45 PM  Result Value Ref Range Status   Specimen Description SPUTUM  Final   Special Requests NONE  Final   Gram Stain   Final    MODERATE WBC PRESENT, PREDOMINANTLY PMN FEW GRAM POSITIVE COCCI RARE GRAM POSITIVE RODS Performed at [facility], [place] [address] [place], [place] [address]    Culture PENDING  Incomplete   Report Status PENDING  Incomplete   Studies/Results: No results found.  Medications:  Prior to Admission:  Prescriptions Prior to Admission  Medication Sig Dispense Refill Last Dose  . albuterol (PROAIR HFA) 108 (90 BASE) MCG/ACT inhaler  Inhale 2 puffs into the lungs every 4 (four) hours as needed for wheezing or shortness of breath.   unknown  . alendronate (FOSAMAX) 70 MG tablet Take 70 mg by  mouth every Friday.    Past Week at Unknown time  . aspirin EC 81 MG tablet Take 81 mg by mouth daily.   07/18/2017 at Unknown time  . Atezolizumab (TECENTRIQ IV) Inject into the vein. Every 3 weeks   Past Month at Unknown time  . atorvastatin (LIPITOR) 40 MG tablet Take 40 mg by mouth at bedtime.    07/10/2017 at Unknown time  . buPROPion (WELLBUTRIN SR) 150 MG 12 hr tablet Take 150 mg by mouth 2 (two) times daily.   07/18/2017 at Unknown time  . cholecalciferol (VITAMIN D) 1000 units tablet Take 1,000 Units by mouth daily.   07/18/2017 at Unknown time  . furosemide (LASIX) 40 MG tablet Take 40 mg by mouth daily. Pt is able to take one additional tablet daily for weight increases of 3lbs in a day or 5lbs in a week.   07/18/2017 at Unknown time  . gabapentin (NEURONTIN) 300 MG capsule Take 600 mg by mouth at bedtime.    07/10/2017 at Unknown time  . Garlic 409 MG TABS Take 300 mg by mouth daily.   07/18/2017 at Unknown time  . ipratropium-albuterol (DUONEB) 0.5-2.5 (3) MG/3ML SOLN Inhale 3 mLs into the lungs every 6 (six) hours as needed (for wheezing/shortness of breath).    Past Week at Unknown time  . LANTUS SOLOSTAR 100 UNIT/ML Solostar Pen Inject 15 Units into the skin at bedtime.    07/17/2017 at Unknown time  . lidocaine-prilocaine (EMLA) cream Apply to affected area once 30 g 3 unknown  . linagliptin (TRADJENTA) 5 MG TABS tablet Take 5 mg by mouth daily.   07/18/2017 at Unknown time  . loratadine (CLARITIN) 10 MG tablet Take 10 mg by mouth daily.  3 07/18/2017 at Unknown time  . losartan (COZAAR) 50 MG tablet Take 50 mg by mouth daily.  2 07/18/2017 at Unknown time  . meclizine (ANTIVERT) 25 MG tablet Take 25 mg by mouth daily.    07/18/2017 at Unknown time  . metoprolol succinate (TOPROL-XL) 25 MG 24 hr tablet Take 1 tablet (25 mg total) by mouth  daily. 90 tablet 3 07/18/2017 at 1000  . nitroGLYCERIN (NITROSTAT) 0.4 MG SL tablet Place 0.4 mg under the tongue every 5 (five) minutes as needed for chest pain.   unknown  . Omega-3 Fatty Acids (FISH OIL PO) Take 1 capsule by mouth daily.   07/18/2017 at Unknown time  . omeprazole (PRILOSEC) 40 MG capsule Take 40 mg by mouth daily.   07/18/2017 at Unknown time  . ondansetron (ZOFRAN) 8 MG tablet Take 1 tablet (8 mg total) by mouth 2 (two) times daily as needed (Nausea or vomiting). 30 tablet 1 unknown  . polyethylene glycol powder (GLYCOLAX/MIRALAX) powder Take 1 capful daily as needed. 255 g 2 unknown  . potassium chloride (K-DUR) 10 MEQ tablet Take 1 tablet (10 mEq total) by mouth daily. 90 tablet 3 07/18/2017 at Unknown time  . prochlorperazine (COMPAZINE) 10 MG tablet Take 1 tablet (10 mg total) by mouth every 6 (six) hours as needed (Nausea or vomiting). 30 tablet 1 unknown  . pyridoxine (B-6) 100 MG tablet Take 100 mg by mouth daily.    07/18/2017 at Unknown time  . rOPINIRole (REQUIP) 0.5 MG tablet Take 0.5 mg by mouth 3 (three) times daily.   07/18/2017 at Unknown time  . sodium chloride (OCEAN) 0.65 % SOLN nasal spray Place 1 spray into both nostrils every 3 (three) hours as needed for congestion.    unknown  .  traMADol (ULTRAM) 50 MG tablet Take 50 mg by mouth every 12 (twelve) hours as needed for moderate pain.    unknown  . zolpidem (AMBIEN) 5 MG tablet Take 5 mg by mouth at bedtime as needed for sleep.   unknown   Scheduled: . aspirin EC  81 mg Oral Daily  . furosemide  40 mg Oral Daily  . guaiFENesin  1,200 mg Oral BID  . insulin aspart  0-9 Units Subcutaneous TID WC  . ipratropium-albuterol  3 mL Inhalation Q6H WA  . mouth rinse  15 mL Mouth Rinse BID  . methylPREDNISolone (SOLU-MEDROL) injection  40 mg Intravenous Q12H  . metoprolol succinate  25 mg Oral Daily  . rOPINIRole  0.5 mg Oral TID  . sodium chloride flush  3 mL Intravenous Q12H   Continuous: . sodium chloride    .  azithromycin Stopped (07/21/17 0400)  . cefTRIAXone (ROCEPHIN)  IV Stopped (07/20/17 1445)   LTR:VUYEBX chloride, albuterol, sodium chloride flush, zolpidem  Assesment:She has community-acquired pneumonia. She has COPD exacerbation acute on chronic hypoxic respiratory failure and personal history of adenocarcinoma of the lung. She looks substantially better than yesterday. She has leukocytosis I suspect from steroids Principal Problem:   Community acquired pneumonia of left lung Active Problems:   Adenocarcinoma of lung (HCC)   CKD (chronic kidney disease) stage 3, GFR 30-59 ml/min   Essential hypertension   Type II diabetes mellitus (HCC)   CHF (congestive heart failure) (HCC)   COPD with acute exacerbation (HCC)   Acute on chronic respiratory failure with hypoxia ([place])    Plan: Continue current treatments    LOS: 2 days   Dijon Kohlman L 07/21/2017, 8:53 AM

## 2017-07-21 NOTE — Care Management Note (Signed)
Case Management Note  Patient Details  Name: Angela Lawson MRN: [id number] Date of Birth: 30-Oct-1934  Subjective/Objective:                  Pt admitted with COPD. She is from home. She is ind with ADL's. She has supplemental oxygen and neb machine PTA. She has PCP, transportation to appointments. She has insurance with drug coverage and no difficulty affording or managing medications.   Action/Plan: Pt plans to return home with self care. No CM needs anticipated.    Expected Discharge Date:       07/22/2017           Expected Discharge Plan:  Home/Self Care  In-House Referral:  NA  Discharge planning Services  CM Consult  Post Acute Care Choice:  NA Choice offered to:  NA  Status of Service:  Completed, signed off  Sherald Barge, RN 07/21/2017, 1:37 PM

## 2017-07-21 NOTE — Progress Notes (Signed)
Inpatient Diabetes Program Recommendations  AACE/ADA: New Consensus Statement on Inpatient Glycemic Control (2015)  Target Ranges:  Prepandial:   less than 140 mg/dL      Peak postprandial:   less than 180 mg/dL (1-2 hours)      Critically ill patients:  140 - 180 mg/dL   Results for Angela Lawson, Angela Lawson (MRN [id number]) as of 07/21/2017 13:45  Ref. Range 07/20/2017 07:29 07/20/2017 11:13 07/20/2017 16:29 07/20/2017 22:37 07/21/2017 07:42 07/21/2017 11:32  Glucose-Capillary Latest Ref Range: 65 - 99 mg/dL 264 (H) 268 (H) 251 (H) 198 (H) 190 (H) 274 (H)   Review of Glycemic Control  Diabetes history: DM2 Outpatient Diabetes medications: Lantus 15 QHS, Tradjenta 5 mg daily Current orders for Inpatient glycemic control: Novolog 0-9 units TID with meals  Inpatient Diabetes Program Recommendations: Correction (SSI): Please consider ordering Novolog 0-5 units QHS for bedtime correction. Insulin - Meal Coverage: While inpatient and ordered steroids, please consider ordering Novolog 3 units TID with meals for meal coverage if patient eats at least 50% of meals.  Thanks, Barnie Alderman, RN, MSN, CDE Diabetes Coordinator Inpatient Diabetes Program [phone] (Team Pager from 8am to 5pm)

## 2017-07-21 NOTE — Progress Notes (Signed)
Subjective: Patient is is still complaining of cough, wheezing and shortness of breath. She has leucocytosis which could due to high dose steroids. No fever or chills. She has difficulty to sleep durng the nighr.  Objective: Vital signs in last 24 hours: Temp:  [97.7 F (36.5 C)-97.8 F (36.6 C)] 97.7 F (36.5 C) (08/14 0616) Pulse Rate:  [72-91] 72 (08/14 0616) Resp:  [16-20] 20 (08/14 0616) BP: (140-170)/(58-81) 170/81 (08/14 0616) SpO2:  [88 %-100 %] 100 % (08/14 0616) Weight change:  Last BM Date: 07/20/17  Intake/Output from previous day: 08/13 0701 - 08/14 0700 In: 720 [P.O.:720] Out: 650 [Urine:650]  PHYSICAL EXAM General appearance: alert and no distress Resp: diminished breath sounds bilaterally and rhonchi bilaterally Cardio: S1, S2 normal GI: soft, non-tender; bowel sounds normal; no masses,  no organomegaly Extremities: extremities normal, atraumatic, no cyanosis or edema  Lab Results:  Results for orders placed or performed during the hospital encounter of 07/18/17 (from the past 48 hour(s))  Glucose, capillary     Status: Abnormal   Collection Time: 07/19/17 11:33 AM  Result Value Ref Range   Glucose-Capillary 275 (H) 65 - 99 mg/dL   Comment 1 Notify RN    Comment 2 Document in Chart   Glucose, capillary     Status: Abnormal   Collection Time: 07/19/17  4:18 PM  Result Value Ref Range   Glucose-Capillary 230 (H) 65 - 99 mg/dL   Comment 1 Notify RN    Comment 2 Document in Chart   Glucose, capillary     Status: Abnormal   Collection Time: 07/19/17  9:19 PM  Result Value Ref Range   Glucose-Capillary 215 (H) 65 - 99 mg/dL   Comment 1 Notify RN    Comment 2 Document in Chart   Strep pneumoniae urinary antigen     Status: None   Collection Time: 07/19/17  9:23 PM  Result Value Ref Range   Strep Pneumo Urinary Antigen NEGATIVE NEGATIVE    Comment:        Infection due to S. pneumoniae cannot be absolutely ruled out since the antigen present may be  below the detection limit of the test.   Culture, respiratory (NON-Expectorated)     Status: None (Preliminary result)   Collection Time: 07/19/17 11:45 PM  Result Value Ref Range   Specimen Description SPUTUM    Special Requests NONE    Gram Stain      MODERATE WBC PRESENT, PREDOMINANTLY PMN FEW GRAM POSITIVE COCCI RARE GRAM POSITIVE RODS Performed at [facility], [place] [address], [place],  [address]    Culture PENDING    Report Status PENDING   Glucose, capillary     Status: Abnormal   Collection Time: 07/20/17  7:29 AM  Result Value Ref Range   Glucose-Capillary 264 (H) 65 - 99 mg/dL  Glucose, capillary     Status: Abnormal   Collection Time: 07/20/17 11:13 AM  Result Value Ref Range   Glucose-Capillary 268 (H) 65 - 99 mg/dL  Glucose, capillary     Status: Abnormal   Collection Time: 07/20/17  4:29 PM  Result Value Ref Range   Glucose-Capillary 251 (H) 65 - 99 mg/dL  Glucose, capillary     Status: Abnormal   Collection Time: 07/20/17 10:37 PM  Result Value Ref Range   Glucose-Capillary 198 (H) 65 - 99 mg/dL   Comment 1 Notify RN    Comment 2 Document in Chart   CBC  Status: Abnormal   Collection Time: 07/21/17  6:33 AM  Result Value Ref Range   WBC 17.5 (H) 4.0 - 10.5 K/uL   RBC 4.24 3.87 - 5.11 MIL/uL   Hemoglobin 12.6 12.0 - 15.0 g/dL   HCT 39.5 36.0 - 46.0 %   MCV 93.2 78.0 - 100.0 fL   MCH 29.7 26.0 - 34.0 pg   MCHC 31.9 30.0 - 36.0 g/dL   RDW 14.3 11.5 - 15.5 %   Platelets 268 150 - 400 K/uL  Basic metabolic panel     Status: Abnormal   Collection Time: 07/21/17  6:33 AM  Result Value Ref Range   Sodium 137 135 - 145 mmol/L   Potassium 4.4 3.5 - 5.1 mmol/L   Chloride 98 (L) 101 - 111 mmol/L   CO2 30 22 - 32 mmol/L   Glucose, Bld 166 (H) 65 - 99 mg/dL   BUN 33 (H) 6 - 20 mg/dL   Creatinine, Ser 1.51 (H) 0.44 - 1.00 mg/dL   Calcium 9.4 8.9 - 10.3 mg/dL   GFR calc non Af Amer 31 (L) >60 mL/min   GFR calc Af Amer 36 (L) >60 mL/min     Comment: (NOTE) The eGFR has been calculated using the CKD EPI equation. This calculation has not been validated in all clinical situations. eGFR's persistently <60 mL/min signify possible Chronic Kidney Disease.    Anion gap 9 5 - 15  Glucose, capillary     Status: Abnormal   Collection Time: 07/21/17  7:42 AM  Result Value Ref Range   Glucose-Capillary 190 (H) 65 - 99 mg/dL   Comment 1 Notify RN    Comment 2 Document in Chart     ABGS No results for input(s): PHART, PO2ART, TCO2, HCO3 in the last 72 hours.  Invalid input(s): PCO2 CULTURES Recent Results (from the past 240 hour(s))  Blood Culture (routine x 2)     Status: None (Preliminary result)   Collection Time: 07/19/17 12:24 AM  Result Value Ref Range Status   Specimen Description RIGHT ANTECUBITAL  Final   Special Requests Blood Culture adequate volume  Final   Culture NO GROWTH 2 DAYS  Final   Report Status PENDING  Incomplete  Blood Culture (routine x 2)     Status: None (Preliminary result)   Collection Time: 07/19/17 12:31 AM  Result Value Ref Range Status   Specimen Description BLOOD RIGHT HAND  Final   Special Requests Blood Culture adequate volume  Final   Culture NO GROWTH 2 DAYS  Final   Report Status PENDING  Incomplete  Culture, respiratory (NON-Expectorated)     Status: None (Preliminary result)   Collection Time: 07/19/17 11:45 PM  Result Value Ref Range Status   Specimen Description SPUTUM  Final   Special Requests NONE  Final   Gram Stain   Final    MODERATE WBC PRESENT, PREDOMINANTLY PMN FEW GRAM POSITIVE COCCI RARE GRAM POSITIVE RODS Performed at [facility], [place] [address] [place], [place] [address]    Culture PENDING  Incomplete   Report Status PENDING  Incomplete   Studies/Results: No results found.  Medications: I have reviewed the patient's current medications.  Assesment:  Principal Problem:   Community acquired pneumonia of left lung Active Problems:   Adenocarcinoma of  lung ([place])   CKD (chronic kidney disease) stage 3, GFR 30-59 ml/min   Essential hypertension   Type II diabetes mellitus (HCC)   CHF (congestive heart failure) ([place])  COPD with acute exacerbation ([place])   Acute on chronic respiratory failure with hypoxia (HCC)    Plan:  Medications reviewed Will continue Iv antibiotics Continue nebulizer and oxygen therapy continue home mediactions    LOS: 2 days   Angela Lawson 07/21/2017, 8:20 AM

## 2017-07-22 LAB — CULTURE, RESPIRATORY W GRAM STAIN: Culture: NORMAL

## 2017-07-22 LAB — GLUCOSE, CAPILLARY: Glucose-Capillary: 226 mg/dL — ABNORMAL HIGH (ref 65–99)

## 2017-07-22 MED ORDER — AMOXICILLIN-POT CLAVULANATE 500-125 MG PO TABS: 1.0000 | ORAL_TABLET | Freq: Three times a day (TID) | ORAL | 0 refills | Status: DC

## 2017-07-22 MED ORDER — AMOXICILLIN-POT CLAVULANATE 500-125 MG PO TABS
1.0000 | ORAL_TABLET | Freq: Three times a day (TID) | ORAL | Status: DC
  Administered 2017-07-22: 500 mg via ORAL
  Filled 2017-07-22: qty 1

## 2017-07-22 MED ORDER — PREDNISONE 10 MG (21) PO TBPK: ORAL_TABLET | ORAL | 0 refills | Status: DC

## 2017-07-22 NOTE — Progress Notes (Signed)
Patient IV removed, tolerated well. Patient given discharge instructions at bedside.  

## 2017-07-22 NOTE — Plan of Care (Signed)
Problem: Activity: Goal: Ability to tolerate increased activity will improve Outcome: Progressing Pt up ad lib to bathroom by herself, when during admission she needed assistance to bathroom. Pt has no c/o shortness of breath when ambulating. Pt oxygen saturations >95 on 3L O2 per [place]. Pt educated on pneumonia, s/s, and proper hand hygiene. Pt verbalized understanding. Will continue to monitor pt.

## 2017-07-22 NOTE — Discharge Summary (Signed)
Physician Discharge Summary  Patient ID: Angela Lawson MRN: [id number] DOB/AGE: 07-09-1934 83 y.o. Primary Care Physician:Kyisha Fowle, MD Admit date: 07/18/2017 Discharge date: 07/22/2017    Discharge Diagnoses:   Principal Problem:   Community acquired pneumonia of left lung Active Problems:   Adenocarcinoma of lung ([place])   CKD (chronic kidney disease) stage 3, GFR 30-59 ml/min   Essential hypertension   Type II diabetes mellitus (HCC)   CHF (congestive heart failure) (HCC)   COPD with acute exacerbation (HCC)   Acute on chronic respiratory failure with hypoxia (HCC)   Allergies as of 07/22/2017      Reactions   Lisinopril Cough      Medication List    TAKE these medications   alendronate 70 MG tablet Commonly known as:  FOSAMAX Take 70 mg by mouth every Friday.   amoxicillin-clavulanate 500-125 MG tablet Commonly known as:  AUGMENTIN Take 1 tablet (500 mg total) by mouth 3 (three) times daily.   aspirin EC 81 MG tablet Take 81 mg by mouth daily.   atorvastatin 40 MG tablet Commonly known as:  LIPITOR Take 40 mg by mouth at bedtime.   buPROPion 150 MG 12 hr tablet Commonly known as:  WELLBUTRIN SR Take 150 mg by mouth 2 (two) times daily.   cholecalciferol 1000 units tablet Commonly known as:  VITAMIN D Take 1,000 Units by mouth daily.   FISH OIL PO Take 1 capsule by mouth daily.   furosemide 40 MG tablet Commonly known as:  LASIX Take 40 mg by mouth daily. Pt is able to take one additional tablet daily for weight increases of 3lbs in a day or 5lbs in a week.   gabapentin 300 MG capsule Commonly known as:  NEURONTIN Take 600 mg by mouth at bedtime.   Garlic 518 MG Tabs Take 300 mg by mouth daily.   ipratropium-albuterol 0.5-2.5 (3) MG/3ML Soln Commonly known as:  DUONEB Inhale 3 mLs into the lungs every 6 (six) hours as needed (for wheezing/shortness of breath).   LANTUS SOLOSTAR 100 UNIT/ML Solostar Pen Generic drug:  Insulin Glargine Inject  15 Units into the skin at bedtime.   lidocaine-prilocaine cream Commonly known as:  EMLA Apply to affected area once   linagliptin 5 MG Tabs tablet Commonly known as:  TRADJENTA Take 5 mg by mouth daily.   loratadine 10 MG tablet Commonly known as:  CLARITIN Take 10 mg by mouth daily.   losartan 50 MG tablet Commonly known as:  COZAAR Take 50 mg by mouth daily.   meclizine 25 MG tablet Commonly known as:  ANTIVERT Take 25 mg by mouth daily.   metoprolol succinate 25 MG 24 hr tablet Commonly known as:  TOPROL-XL Take 1 tablet (25 mg total) by mouth daily.   nitroGLYCERIN 0.4 MG SL tablet Commonly known as:  NITROSTAT Place 0.4 mg under the tongue every 5 (five) minutes as needed for chest pain.   omeprazole 40 MG capsule Commonly known as:  PRILOSEC Take 40 mg by mouth daily.   ondansetron 8 MG tablet Commonly known as:  ZOFRAN Take 1 tablet (8 mg total) by mouth 2 (two) times daily as needed (Nausea or vomiting).   polyethylene glycol powder powder Commonly known as:  GLYCOLAX/MIRALAX Take 1 capful daily as needed.   potassium chloride 10 MEQ tablet Commonly known as:  K-DUR Take 1 tablet (10 mEq total) by mouth daily.   predniSONE 10 MG (21) Tbpk tablet Commonly known as:  STERAPRED UNI-PAK 21  TAB 4 tab po daily for 2 days then 3 tab po daily for 2 days, 2 tab po daily for 2 days then 1 tab po daily for 2 days   PROAIR HFA 108 (90 Base) MCG/ACT inhaler Generic drug:  albuterol Inhale 2 puffs into the lungs every 4 (four) hours as needed for wheezing or shortness of breath.   prochlorperazine 10 MG tablet Commonly known as:  COMPAZINE Take 1 tablet (10 mg total) by mouth every 6 (six) hours as needed (Nausea or vomiting).   pyridoxine 100 MG tablet Commonly known as:  B-6 Take 100 mg by mouth daily.   rOPINIRole 0.5 MG tablet Commonly known as:  REQUIP Take 0.5 mg by mouth 3 (three) times daily.   sodium chloride 0.65 % Soln nasal spray Commonly  known as:  OCEAN Place 1 spray into both nostrils every 3 (three) hours as needed for congestion.   TECENTRIQ IV Inject into the vein. Every 3 weeks   traMADol 50 MG tablet Commonly known as:  ULTRAM Take 50 mg by mouth every 12 (twelve) hours as needed for moderate pain.   zolpidem 5 MG tablet Commonly known as:  AMBIEN Take 5 mg by mouth at bedtime as needed for sleep.       Discharged Condition: improved    Consults: pulmonarry  Significant Diagnostic Studies: Dg Chest 2 View  Result Date: 06/22/2017 CLINICAL DATA:  Worsening dyspnea since Thursday with productive cough. EXAM: CHEST  2 VIEW COMPARISON:  04/26/2017. FINDINGS: Chronic elevation and lateralization of the left hemidiaphragm with chronic deformity of multiple left-sided ribs. The heart is top-normal in size with aortic atherosclerosis. Chronic mild bilateral interstitial prominence without pneumonic consolidation, pneumothorax nor overt pulmonary edema. Degenerative changes are seen about both shoulders and dorsal spine. Port catheter tip in the distal SVC. IMPRESSION: Chronic interstitial change of the lungs without pneumonic consolidation or CHF. Aortic atherosclerosis. Chronic blunting and elevation of the left hemidiaphragm with adjacent chronic appearing deformity of multiple left-sided ribs. Electronically Signed   By: Ashley Royalty M.D.   On: 06/22/2017 23:36   Dg Chest Port 1 View  Result Date: 07/19/2017 CLINICAL DATA:  83 y/o  F; shortness of breath and cough. EXAM: PORTABLE CHEST 1 VIEW COMPARISON:  06/22/2017 chest radiograph FINDINGS: The mild cardiomegaly. Aortic atherosclerosis with calcification. Mild pulmonary vascular congestion. Hazy opacity left lung base. No pleural effusion or pneumothorax. Right port catheter tip projects over lower SVC. No acute osseous abnormality is evident. IMPRESSION: Hazy opacification of left lung base may represent atelectasis or pulmonary infiltrate. Pulmonary vascular  congestion and cardiomegaly. Electronically Signed   By: Kristine Garbe M.D.   On: 07/19/2017 00:35    Lab Results: Basic Metabolic Panel:  Recent Labs  07/21/17 0633  NA 137  K 4.4  CL 98*  CO2 30  GLUCOSE 166*  BUN 33*  CREATININE 1.51*  CALCIUM 9.4   Liver Function Tests: No results for input(s): AST, ALT, ALKPHOS, BILITOT, PROT, ALBUMIN in the last 72 hours.   CBC:  Recent Labs  07/21/17 0633  WBC 17.5*  HGB 12.6  HCT 39.5  MCV 93.2  PLT 268    Recent Results (from the past 240 hour(s))  Blood Culture (routine x 2)     Status: None (Preliminary result)   Collection Time: 07/19/17 12:24 AM  Result Value Ref Range Status   Specimen Description RIGHT ANTECUBITAL  Final   Special Requests Blood Culture adequate volume  Final   Culture  NO GROWTH 3 DAYS  Final   Report Status PENDING  Incomplete  Blood Culture (routine x 2)     Status: None (Preliminary result)   Collection Time: 07/19/17 12:31 AM  Result Value Ref Range Status   Specimen Description BLOOD RIGHT HAND  Final   Special Requests Blood Culture adequate volume  Final   Culture NO GROWTH 3 DAYS  Final   Report Status PENDING  Incomplete  Culture, respiratory (NON-Expectorated)     Status: None (Preliminary result)   Collection Time: 07/19/17 11:45 PM  Result Value Ref Range Status   Specimen Description SPUTUM  Final   Special Requests NONE  Final   Gram Stain   Final    MODERATE WBC PRESENT, PREDOMINANTLY PMN FEW GRAM POSITIVE COCCI RARE GRAM POSITIVE RODS    Culture   Final    CULTURE REINCUBATED FOR BETTER GROWTH Performed at [facility], [place] [address], [place], [place] [address]    Report Status PENDING  Incomplete     Hospital Course:   Amelia Jo is an 83 years old female with history of multiple medical illnesses was admitted due to pneumonia and COPD exacerbations. Patient was started on combinations of antibiotics, IV steroids and nebulizer treatment. She was evaluated by  pulmonologist. Patient improved and disccharged on oral antiobiotics and oral steroids.  Discharge Exam: Blood pressure (!) 167/77, pulse 65, temperature 98.7 F (37.1 C), temperature source Oral, resp. rate 18, height 5\' 4"  (1.626 m), weight 82.1 kg (180 lb 14.4 oz), SpO2 98 %.    Disposition:  home    Follow-up Information    Rosita Fire, MD Follow up in 2 week(s).   Specialty:  Internal Medicine Contact information: [place] [address] [phone]           Signed: Rosita Fire   07/22/2017, 8:38 AM

## 2017-07-22 NOTE — Care Management Important Message (Signed)
Important Message  Patient Details  Name: Angela Lawson MRN: [id number] Date of Birth: [birth year]   Medicare Important Message Given:  Yes    Sherald Barge, RN 07/22/2017, 11:04 AM

## 2017-07-22 NOTE — Progress Notes (Signed)
She is much improved. Still still using oxygen. Plans are for her to be discharged today. She will need follow-up chest x-ray in about a month to document clearing of the pneumonia

## 2017-07-24 LAB — CULTURE, BLOOD (ROUTINE X 2)
Culture: NO GROWTH
Culture: NO GROWTH
Special Requests: ADEQUATE
Special Requests: ADEQUATE

## 2017-07-25 DIAGNOSIS — J449 Chronic obstructive pulmonary disease, unspecified: Secondary | ICD-10-CM | POA: Diagnosis not present

## 2017-07-27 ENCOUNTER — Ambulatory Visit (HOSPITAL_COMMUNITY)
Admission: RE | Admit: 2017-07-27 | Discharge: 2017-07-27 | Disposition: A | Discharge status: Home / Self Care | Payer: Medicare Other | Source: Ambulatory Visit | Attending: Oncology | Admitting: Oncology

## 2017-07-27 DIAGNOSIS — J439 Emphysema, unspecified: Secondary | ICD-10-CM | POA: Diagnosis not present

## 2017-07-27 DIAGNOSIS — K5730 Diverticulosis of large intestine without perforation or abscess without bleeding: Secondary | ICD-10-CM | POA: Diagnosis not present

## 2017-07-27 DIAGNOSIS — R918 Other nonspecific abnormal finding of lung field: Secondary | ICD-10-CM | POA: Diagnosis not present

## 2017-07-27 DIAGNOSIS — C3490 Malignant neoplasm of unspecified part of unspecified bronchus or lung: Secondary | ICD-10-CM | POA: Diagnosis not present

## 2017-07-27 DIAGNOSIS — R109 Unspecified abdominal pain: Secondary | ICD-10-CM | POA: Diagnosis not present

## 2017-07-27 DIAGNOSIS — R590 Localized enlarged lymph nodes: Secondary | ICD-10-CM | POA: Diagnosis not present

## 2017-07-27 DIAGNOSIS — I700 Atherosclerosis of aorta: Secondary | ICD-10-CM | POA: Insufficient documentation

## 2017-07-27 MED ORDER — IOPAMIDOL (ISOVUE-300) INJECTION 61%
100.0000 mL | Freq: Once | INTRAVENOUS | Status: AC | PRN
  Administered 2017-07-27: 80 mL via INTRAVENOUS

## 2017-07-29 ENCOUNTER — Ambulatory Visit: Payer: Medicare Other | Admitting: Cardiovascular Disease

## 2017-07-30 ENCOUNTER — Encounter (HOSPITAL_COMMUNITY): Payer: Self-pay | Admitting: Oncology

## 2017-07-30 ENCOUNTER — Encounter (HOSPITAL_BASED_OUTPATIENT_CLINIC_OR_DEPARTMENT_OTHER): Payer: Medicare Other

## 2017-07-30 ENCOUNTER — Encounter (HOSPITAL_BASED_OUTPATIENT_CLINIC_OR_DEPARTMENT_OTHER): Payer: Medicare Other | Admitting: Oncology

## 2017-07-30 VITALS — BP 122/63 | HR 68 | Temp 98.1°F | Resp 18 | Wt 178.0 lb

## 2017-07-30 VITALS — BP 124/50 | HR 73 | Resp 16

## 2017-07-30 DIAGNOSIS — Z5189 Encounter for other specified aftercare: Secondary | ICD-10-CM | POA: Diagnosis not present

## 2017-07-30 DIAGNOSIS — C3490 Malignant neoplasm of unspecified part of unspecified bronchus or lung: Secondary | ICD-10-CM

## 2017-07-30 DIAGNOSIS — C3492 Malignant neoplasm of unspecified part of left bronchus or lung: Secondary | ICD-10-CM | POA: Diagnosis not present

## 2017-07-30 DIAGNOSIS — Z7189 Other specified counseling: Secondary | ICD-10-CM

## 2017-07-30 DIAGNOSIS — C3431 Malignant neoplasm of lower lobe, right bronchus or lung: Secondary | ICD-10-CM

## 2017-07-30 DIAGNOSIS — C3412 Malignant neoplasm of upper lobe, left bronchus or lung: Secondary | ICD-10-CM | POA: Diagnosis not present

## 2017-07-30 DIAGNOSIS — Z5112 Encounter for antineoplastic immunotherapy: Secondary | ICD-10-CM

## 2017-07-30 LAB — COMPREHENSIVE METABOLIC PANEL
ALT: 21 U/L (ref 14–54)
AST: 23 U/L (ref 15–41)
Albumin: 3.4 g/dL — ABNORMAL LOW (ref 3.5–5.0)
Alkaline Phosphatase: 49 U/L (ref 38–126)
Anion gap: 8 (ref 5–15)
BUN: 22 mg/dL — ABNORMAL HIGH (ref 6–20)
CO2: 30 mmol/L (ref 22–32)
Calcium: 8.8 mg/dL — ABNORMAL LOW (ref 8.9–10.3)
Chloride: 99 mmol/L — ABNORMAL LOW (ref 101–111)
Creatinine, Ser: 1.53 mg/dL — ABNORMAL HIGH (ref 0.44–1.00)
GFR calc Af Amer: 36 mL/min — ABNORMAL LOW (ref >60)
GFR calc non Af Amer: 31 mL/min — ABNORMAL LOW (ref >60)
Glucose, Bld: 179 mg/dL — ABNORMAL HIGH (ref 65–99)
Potassium: 5 mmol/L (ref 3.5–5.1)
Sodium: 137 mmol/L (ref 135–145)
Total Bilirubin: 0.5 mg/dL (ref 0.3–1.2)
Total Protein: 6.6 g/dL (ref 6.5–8.1)

## 2017-07-30 LAB — CBC WITH DIFFERENTIAL/PLATELET
Basophils Absolute: 0 10*3/uL (ref 0.0–0.1)
Basophils Relative: 0 %
Eosinophils Absolute: 0.1 10*3/uL (ref 0.0–0.7)
Eosinophils Relative: 0 %
HCT: 40.8 % (ref 36.0–46.0)
Hemoglobin: 12.7 g/dL (ref 12.0–15.0)
Lymphocytes Relative: 6 %
Lymphs Abs: 1.1 10*3/uL (ref 0.7–4.0)
MCH: 29.8 pg (ref 26.0–34.0)
MCHC: 31.1 g/dL (ref 30.0–36.0)
MCV: 95.8 fL (ref 78.0–100.0)
Monocytes Absolute: 1 10*3/uL (ref 0.1–1.0)
Monocytes Relative: 6 %
Neutro Abs: 15.7 10*3/uL — ABNORMAL HIGH (ref 1.7–7.7)
Neutrophils Relative %: 88 %
Platelets: 190 10*3/uL (ref 150–400)
RBC: 4.26 MIL/uL (ref 3.87–5.11)
RDW: 14.9 % (ref 11.5–15.5)
WBC: 18 10*3/uL — ABNORMAL HIGH (ref 4.0–10.5)

## 2017-07-30 MED ORDER — STERILE WATER FOR INJECTION IJ SOLN
INTRAMUSCULAR | Status: AC
  Filled 2017-07-30: qty 10

## 2017-07-30 MED ORDER — SODIUM CHLORIDE 0.9% FLUSH
10.0000 mL | INTRAVENOUS | Status: DC | PRN
  Administered 2017-07-30: 10 mL
  Filled 2017-07-30: qty 10

## 2017-07-30 MED ORDER — SODIUM CHLORIDE 0.9 % IV SOLN
Freq: Once | INTRAVENOUS | Status: AC
  Administered 2017-07-30: 13:00:00 via INTRAVENOUS

## 2017-07-30 MED ORDER — HEPARIN SOD (PORK) LOCK FLUSH 100 UNIT/ML IV SOLN
500.0000 [IU] | Freq: Once | INTRAVENOUS | Status: AC | PRN
  Administered 2017-07-30: 500 [IU]
  Filled 2017-07-30: qty 5

## 2017-07-30 MED ORDER — ALTEPLASE 2 MG IJ SOLR
INTRAMUSCULAR | Status: AC
  Filled 2017-07-30: qty 2

## 2017-07-30 MED ORDER — ALTEPLASE 2 MG IJ SOLR
2.0000 mg | Freq: Once | INTRAMUSCULAR | Status: AC
  Administered 2017-07-30: 2 mg

## 2017-07-30 MED ORDER — ATEZOLIZUMAB CHEMO INJECTION 1200 MG/20ML
1200.0000 mg | Freq: Once | INTRAVENOUS | Status: AC
  Administered 2017-07-30: 1200 mg via INTRAVENOUS
  Filled 2017-07-30: qty 20

## 2017-07-30 NOTE — Progress Notes (Signed)
Altaplase given . Waited an hour-pulled back 67m of Altaplase. No blood return, no resistance met when flushing.    Chemotherapy given today as ordered . Patient tolerated it well, no issues. Vitals stable and discharged home from clinic via wheelchair. Follow up as scheduled.

## 2017-07-30 NOTE — Patient Instructions (Signed)
[facility] Discharge Instructions for Patients Receiving Chemotherapy   Beginning January 23rd 2017 lab work for the Cancer Center will be done in the  Main lab at [facility] on 1st floor. If you have a lab appointment with the Cancer Center please come in thru the  Main Entrance and check in at the main information desk   Today you received the following chemotherapy agents   To help prevent nausea and vomiting after your treatment, we encourage you to take your nausea medication     If you develop nausea and vomiting, or diarrhea that is not controlled by your medication, call the clinic.  The clinic phone number is [phone]. Office hours are Monday-Friday 8:30am-5:00pm.  BELOW ARE SYMPTOMS THAT SHOULD BE REPORTED IMMEDIATELY:  *FEVER GREATER THAN 101.0 F  *CHILLS WITH OR WITHOUT FEVER  NAUSEA AND VOMITING THAT IS NOT CONTROLLED WITH YOUR NAUSEA MEDICATION  *UNUSUAL SHORTNESS OF BREATH  *UNUSUAL BRUISING OR BLEEDING  TENDERNESS IN MOUTH AND THROAT WITH OR WITHOUT PRESENCE OF ULCERS  *URINARY PROBLEMS  *BOWEL PROBLEMS  UNUSUAL RASH Items with * indicate a potential emergency and should be followed up as soon as possible. If you have an emergency after office hours please contact your primary care physician or go to the nearest emergency department.  Please call the clinic during office hours if you have any questions or concerns.   You may also contact the Patient Navigator at [phone] should you have any questions or need assistance in obtaining follow up care.      Resources For Cancer Patients and their Caregivers ? [employer]: Can assist with transportation, wigs, general needs, runs Look Good Feel Better.        1-888-227-6333 ? Cancer Care: Provides financial assistance, online support groups, medication/co-pay assistance.  1-800-813-HOPE (4673) ? [facility] Assists [place] cancer  patients and their families through emotional , educational and financial support.  336-427-4357 ? [place] DSS Where to apply for food stamps, Medicaid and utility assistance. 336-342-1394 ? [facility]: Transportation to medical appointments. 336-347-2287 ? Social Security Administration: May apply for disability if have a Stage IV cancer. 336-342-7796 1-800-772-1213 ? [place] [facility]: Assists with nutrition, care and transit needs. 336-349-2343         

## 2017-07-30 NOTE — Progress Notes (Signed)
[place], [place] [address]   CLINIC:  Medical Oncology/Hematology  PCP:  Rosita Fire, MD [place] [address] [phone]   REASON FOR VISIT:  New/recurrent adenocarcinoma of the left and right lung; h/o Stage IA NSCLC s/p resection in 2009.   CURRENT THERAPY: Initial work-up    BRIEF ONCOLOGIC HISTORY:  Oncology History   stage I A., non-small cell lung cancer, consistent with a poorly differentiated adenocarcinoma, status post resection 07/07/2008 in [place].       Adenocarcinoma of lung ([place])   07/07/2008 Surgery    Performed in [place], [place].  Consistent with poorly differentiated adenocarcinoma.      02/27/2014 Imaging    CT of chest demonstrating a RLL solid nodular component measuring 9 mm at the site of previous ground-glass opacity. Slight increase in size of an anterior mediastinal node measuring 1.1 cm with stable 1 cm pretracheal lymph node. Consider PET      03/20/2014 Imaging    PET scan- No hypermetabolic pulmonary lesions to suggest recurrent or metastatic pulmonary disease.  Weakly positive left internal mammary lymph node and left axillary lymph node (? inflammatory process involving the left breast).      11/07/2015 Imaging    CT chest- 1. No evidence of metastatic involvement of the lungs. The previously noted small noncalcified lung nodules are stable. 2. Vague focal opacity in the right lower lobe may represent scarring and is stable. Continued followup however is recommended in 1 year.      01/08/2017 Imaging    CT chest- 1. New nodules along a thick walled bullous lesion in the right lower lobe, suspicious for malignancy. 2. New fairly large region of confluent ground-glass opacity posteriorly in the remaining left lung, low grade adenocarcinoma is not excluded and there is mild new nodularity along the upper margin of this process. 3. Stable biapical pleuroparenchymal  scarring with several stable nodules in the left lung. 4. Borderline adenopathy in the mediastinum, essentially stable from prior. 5. Coronary, aortic arch, and branch vessel atherosclerotic vascular disease. 6. Small hiatal hernia. 7. Hypodense left thyroid lesion. If not previously worked up, thyroid ultrasound could be employed.      01/21/2017 PET scan    1. New hypermetabolic adenopathy in the mediastinum and hila, with progressive hypermetabolic ground-glass density posteriorly in the left lower lobe with faint nodular components, and some low-grade hypermetabolic activity in ground-glass opacity dependently in the right upper lobe. Although the appearance is concerning for malignancy potentially with a lymphangitic component in the left lower lobe, the unusual configuration in the appearance of the ground-glass opacities could conceivably represent an atypical granulomatous infectious process as an alternative. This is not classic obvious recurrence with well-defined mass or nodules. 2. There is some ill-defined clustered nodularity in the right lower lobe only faintly metabolic, maximum SUV 3.0. 3. Overall I believe that this process require surveillance to differentiate an inflammatory/infectious condition from recurrent malignancy. The adenopathy is certainly concerning. 4. No findings of malignancy in the neck, abdomen/pelvis, or skeleton. 5. Sigmoid colon diverticulosis. 6. Emphysema. 7. Chronic ethmoid sinusitis.      03/25/2017 Imaging    CT chest- Stable 5.9 cm left lower lobe ground-glass opacity, hypermetabolic on prior PET, worrisome for primary bronchogenic neoplasm such as low-grade adenocarcinoma.  Stable 2.2 cm cavitary lesion in the central right lower lobe with surrounding hypermetabolic nodularity measuring up to 1.8 cm, worrisome for primary bronchogenic neoplasm such as  squamous cell carcinoma.  Hypermetabolic mediastinal lymph nodes measuring up  to 11 mm short axis, as above. Bilateral hilar regions are hypermetabolic on PET but poorly evaluated on unenhanced CT.  Emphysema and aortic atherosclerosis.      04/23/2017 Procedure    Bronchoscopy and EBUS with mediastinal lymph node aspirations by Dr. Roxan Hockey.      04/26/2017 Pathology Results    1. Lung, biopsy, Left upper lobe - ADENOCARCINOMA. - SEE COMMENT. 2. Lung, biopsy, Right lower lobe - ADENOCARCINOMA. - SEE COMMENT. 3. Lung, biopsy, Right lower lobe target 2 - ATYPICAL CELLS. - SEE COMMENT.      04/26/2017 Relapse/Recurrence         05/14/2017 Pathology Results    PDL1 NEGATIVE.  TPS 0%.      05/19/2017 Pathology Results    [employer] testing- MS-stable.  TMB 6 Muts/Mb (Intermediate).  STK11 E293*.  Negative for EGFR, KRAS, ALK, BRAF, MET, RET, ERBB2, and ROS1.  Potential treatment options: Atezolizumab/Durvalumab/Nivolumab/Pembrolizumab for TMB score.      07/27/2017 Imaging    CT C/A/P: IMPRESSION: Resolution of right lower lobe lesion and nodules.  Unchanged ill-defined nonspecific left lower lobe ground-glass opacity and stable mildly prominent mediastinal lymph nodes.  No evidence of metastatic disease or acute abnormality within the abdomen or pelvis.  Aortic Atherosclerosis (ICD10-I70.0) and Emphysema (ICD10-J43.9).        HISTORY OF PRESENT ILLNESS:  (From Kirby Crigler, PA-C's last note on 12/05/16)   On 01/07/17, she had CT chest for surveillance of her lung cancer; new nodules were noted and concerning for malignancy.  She was recently admitted on 01/10/17-01/13/17 for COPD exacerbation; she was discharged on oral steroids at that time.  On 01/21/17, she underwent PET scan revealing new hypermetabolic adenopathy in the mediastinum and hilum, with progressive hypermetabolic groundglass density in the left lower lobe; there is mention of possible concern of malignancy with a lymphangitic component in the left lower lobe, but this may also  represent an atypical granulomatous infectious process as well; there were no findings of malignancy in the neck, abdomen/pelvis, or skeleton. It was recommended by cardiothoracic surgery that we repeat imaging in 2-3 months after hospitalization to re-assess lesions noted to CT and PET scans.     INTERVAL HISTORY: Patient presents for follow-up today and to get cycle 4 of tecentriq today. She states she has been doing well except for having diarrhea since she drank the contrast for her restaging CT scans on 07/27/17. She has been tolerating her tecentriq treatments very well without any side effects. She denies any fatigue, chest pain, shortness of breath above her baseline, abdominal pain, focal weakness. Appetite is good. No weight loss.    REVIEW OF SYSTEMS:  Review of Systems  Constitutional: Negative for appetite change, chills, fatigue and fever.  HENT:   Negative for hearing loss, lump/mass, mouth sores, sore throat, tinnitus and trouble swallowing.   Eyes: Negative.  Negative for eye problems and icterus.  Respiratory: Positive for shortness of breath (at baseline). Negative for chest tightness, cough, hemoptysis and wheezing.   Cardiovascular: Negative.  Negative for chest pain, leg swelling and palpitations.  Gastrointestinal: Negative.  Negative for abdominal distention, abdominal pain, blood in stool, constipation, diarrhea, nausea and vomiting.  Endocrine: Negative.  Negative for hot flashes.  Genitourinary: Negative.  Negative for difficulty urinating, dysuria, frequency, hematuria and vaginal bleeding.   Musculoskeletal: Negative.  Negative for arthralgias and neck pain.  Skin: Negative.  Negative for itching and rash.  Neurological:  Negative for dizziness, headaches and speech difficulty.  Hematological: Negative for adenopathy. Does not bruise/bleed easily.  Psychiatric/Behavioral: Negative.  Negative for confusion. The patient is not nervous/anxious.      PAST  MEDICAL/SURGICAL HISTORY:  Past Medical History:  Diagnosis Date  . Adenocarcinoma of lung ([place])    Left lung 2009, resected  . Anginal pain ([place])   . Arthritis   . Back pain   . CHF (congestive heart failure) ([place])   . COPD (chronic obstructive pulmonary disease) ([place])   . Diverticulitis   . Dyslipidemia   . Essential hypertension   . H/O ventral hernia   . Non-obstructive CAD    a. 04/2015 NSTEMI/Cath: LAD 10p, LCX 39m RCA 349m20d, EF 35-40 w/ apical ballooning.  . On home O2    3L N/C  . Osteoporosis   . Osteoporosis 11/04/2015   Managed by Dr. FaLegrand Rams . Parkinson's disease ([facility]  . Shortness of breath   . Takotsubo cardiomyopathy    a. 04/2015 Echo: EF 45-50%, mid-dist anterior/apical/inferoapical HK w/ hyperdynamic base. Gr 1 DD, mild AI, mild-mod MR, triv TR, PASP 4884m;  b. 04/2015 LV gram: Ef 35-40% w/ apical ballooning.  . Type II diabetes mellitus ([facility] . Ventricular bigeminy    a. 04/2015 in setting of NSTEMI/Takotsubo.   Past Surgical History:  Procedure Laterality Date  . ABDOMINAL HYSTERECTOMY    . CARDIAC CATHETERIZATION N/A 04/17/2015   Procedure: Left Heart Cath and Coronary Angiography;  Surgeon: ThoTroy SineD;  Location: [facility];  Service: Cardiovascular;  Laterality: N/A;  . CHOLECYSTECTOMY    . COLONOSCOPY N/A 09/18/2014   Procedure: COLONOSCOPY;  Surgeon: SanDanie BinderD;  Location: [facility];  Service: Endoscopy;  Laterality: N/A;  8:30 AM - moved to 10:30 - CRosendo Gros notify pt  . ECTOPIC PREGNANCY SURGERY    . INCISIONAL HERNIA REPAIR N/A 08/26/2013   Procedure: HERNIA REPAIR INCISIONAL WITH MESH;  Surgeon: MarJamesetta SoD;  Location: [facility] ORS;  Service: General;  Laterality: N/A;  . IR FLUORO GUIDE PORT INSERTION RIGHT  04/30/2017  . IR US [place] VASC ACCESS RIGHT  04/30/2017  . LUNG CANCER SURGERY    . VIDEO BRONCHOSCOPY WITH ENDOBRONCHIAL NAVIGATION N/A 04/23/2017   Procedure: VIDEO BRONCHOSCOPY WITH ENDOBRONCHIAL NAVIGATION;   Surgeon: HenMelrose NakayamaD;  [facility];  Laterality: N/A;  . VIDEO BRONCHOSCOPY WITH ENDOBRONCHIAL ULTRASOUND N/A 04/23/2017   Procedure: VIDEO BRONCHOSCOPY WITH ENDOBRONCHIAL ULTRASOUND;  Surgeon: HenMelrose NakayamaD;  [facility];  Laterality: N/A;     SOCIAL HISTORY:  Social History   Social History  . Marital status: Widowed    Spouse name: N/A  . Number of children: N/A  . Years of education: N/A   Occupational History  . Not on file.   Social History Main Topics  . Smoking status: Former Smoker    Years: 20.00    Types: Cigarettes    Quit date: 08/13/2006  . Smokeless tobacco: Never Used  . Alcohol use No  . Drug use: No  . Sexual activity: Not Currently    Birth control/ protection: Surgical   Other Topics Concern  . Not on file   Social History Narrative  . No narrative on file    FAMILY HISTORY:  Family History  Problem Relation Age of Onset  . Diabetes Mother   . Hypertension Mother   . Diabetes  Father   . Asthma Unknown   . Cancer Unknown   . Heart attack Sister        X2  . Colon cancer Neg Hx     CURRENT MEDICATIONS:  Outpatient Encounter Prescriptions as of 07/30/2017  Medication Sig  . albuterol (PROAIR HFA) 108 (90 BASE) MCG/ACT inhaler Inhale 2 puffs into the lungs every 4 (four) hours as needed for wheezing or shortness of breath.  Marland Kitchen alendronate (FOSAMAX) 70 MG tablet Take 70 mg by mouth every Friday.   Marland Kitchen amoxicillin-clavulanate (AUGMENTIN) 500-125 MG tablet Take 1 tablet (500 mg total) by mouth 3 (three) times daily.  Marland Kitchen aspirin EC 81 MG tablet Take 81 mg by mouth daily.  Huey Bienenstock (TECENTRIQ IV) Inject into the vein. Every 3 weeks  . atorvastatin (LIPITOR) 40 MG tablet Take 40 mg by mouth at bedtime.   Marland Kitchen buPROPion (WELLBUTRIN SR) 150 MG 12 hr tablet Take 150 mg by mouth 2 (two) times daily.  . cholecalciferol (VITAMIN D) 1000 units tablet Take 1,000 Units by mouth daily.  .  furosemide (LASIX) 40 MG tablet Take 40 mg by mouth daily. Pt is able to take one additional tablet daily for weight increases of 3lbs in a day or 5lbs in a week.  . gabapentin (NEURONTIN) 300 MG capsule Take 600 mg by mouth at bedtime.   . Garlic 433 MG TABS Take 300 mg by mouth daily.  Marland Kitchen ipratropium-albuterol (DUONEB) 0.5-2.5 (3) MG/3ML SOLN Inhale 3 mLs into the lungs every 6 (six) hours as needed (for wheezing/shortness of breath).   Marland Kitchen LANTUS SOLOSTAR 100 UNIT/ML Solostar Pen Inject 15 Units into the skin at bedtime.   . lidocaine-prilocaine (EMLA) cream Apply to affected area once  . linagliptin (TRADJENTA) 5 MG TABS tablet Take 5 mg by mouth daily.  Marland Kitchen loratadine (CLARITIN) 10 MG tablet Take 10 mg by mouth daily.  Marland Kitchen losartan (COZAAR) 50 MG tablet Take 50 mg by mouth daily.  . meclizine (ANTIVERT) 25 MG tablet Take 25 mg by mouth daily.   . metoprolol succinate (TOPROL-XL) 25 MG 24 hr tablet Take 1 tablet (25 mg total) by mouth daily.  . nitroGLYCERIN (NITROSTAT) 0.4 MG SL tablet Place 0.4 mg under the tongue every 5 (five) minutes as needed for chest pain.  . Omega-3 Fatty Acids (FISH OIL PO) Take 1 capsule by mouth daily.  Marland Kitchen omeprazole (PRILOSEC) 40 MG capsule Take 40 mg by mouth daily.  . ondansetron (ZOFRAN) 8 MG tablet Take 1 tablet (8 mg total) by mouth 2 (two) times daily as needed (Nausea or vomiting).  . polyethylene glycol powder (GLYCOLAX/MIRALAX) powder Take 1 capful daily as needed.  . potassium chloride (K-DUR) 10 MEQ tablet Take 1 tablet (10 mEq total) by mouth daily.  . predniSONE (STERAPRED UNI-PAK 21 TAB) 10 MG (21) TBPK tablet 4 tab po daily for 2 days then 3 tab po daily for 2 days, 2 tab po daily for 2 days then 1 tab po daily for 2 days  . prochlorperazine (COMPAZINE) 10 MG tablet Take 1 tablet (10 mg total) by mouth every 6 (six) hours as needed (Nausea or vomiting).  . pyridoxine (B-6) 100 MG tablet Take 100 mg by mouth daily.   Marland Kitchen rOPINIRole (REQUIP) 0.5 MG tablet Take  0.5 mg by mouth 3 (three) times daily.  . sodium chloride (OCEAN) 0.65 % SOLN nasal spray Place 1 spray into both nostrils every 3 (three) hours as needed for congestion.   . traMADol Veatrice Bourbon)  50 MG tablet Take 50 mg by mouth every 12 (twelve) hours as needed for moderate pain.   Marland Kitchen zolpidem (AMBIEN) 5 MG tablet Take 5 mg by mouth at bedtime as needed for sleep.   No facility-administered encounter medications on file as of 07/30/2017.     ALLERGIES:  Allergies  Allergen Reactions  . Lisinopril Cough     PHYSICAL EXAM:  ECOG Performance status: 1-2  Vitals:   07/30/17 1000  BP: 122/63  Pulse: 68  Resp: 18  Temp: 98.1 F (36.7 C)  SpO2: 96%     Physical Exam  Constitutional: She is oriented to person, place, and time and well-developed, well-nourished, and in no distress. No distress.  oxygen dependent with portable oxygen   HENT:  Head: Normocephalic and atraumatic.  Mouth/Throat: Oropharynx is clear and moist. No oropharyngeal exudate.  Eyes: Pupils are equal, round, and reactive to light. Conjunctivae are normal. No scleral icterus.  Neck: Normal range of motion. Neck supple. No JVD present.  Cardiovascular: Normal rate, regular rhythm and normal heart sounds.  Exam reveals no gallop and no friction rub.   No murmur heard. Pulmonary/Chest: Effort normal. No respiratory distress. She has no wheezes. She has no rhonchi. She has no rales.  Abdominal: Soft. Bowel sounds are normal. She exhibits no distension. There is no tenderness. There is no rebound and no guarding.  Musculoskeletal: Normal range of motion. She exhibits no edema or tenderness.  Lymphadenopathy:    She has no cervical adenopathy.  Neurological: She is alert and oriented to person, place, and time. No cranial nerve deficit.  Skin: Skin is warm and dry. No rash noted. No erythema. No pallor.  Psychiatric: Mood, memory, affect and judgment normal.  Nursing note and vitals reviewed.    LABORATORY DATA:    I have reviewed the labs as listed.  CBC    Component Value Date/Time   WBC 17.5 (H) 07/21/2017 0633   RBC 4.24 07/21/2017 0633   HGB 12.6 07/21/2017 0633   HCT 39.5 07/21/2017 0633   PLT 268 07/21/2017 0633   MCV 93.2 07/21/2017 0633   MCH 29.7 07/21/2017 0633   MCHC 31.9 07/21/2017 0633   RDW 14.3 07/21/2017 0633   LYMPHSABS 0.4 (L) 07/19/2017 0609   MONOABS 0.1 07/19/2017 0609   EOSABS 0.0 07/19/2017 0609   BASOSABS 0.0 07/19/2017 0609   CMP Latest Ref Rng & Units 07/21/2017 07/19/2017 07/19/2017  Glucose 65 - 99 mg/dL 166(H) 310(H) 209(H)  BUN 6 - 20 mg/dL 33(H) 22(H) 19  Creatinine 0.44 - 1.00 mg/dL 1.51(H) 1.46(H) 1.45(H)  Sodium 135 - 145 mmol/L 137 137 140  Potassium 3.5 - 5.1 mmol/L 4.4 4.4 4.0  Chloride 101 - 111 mmol/L 98(L) 98(L) 101  CO2 22 - 32 mmol/L '30 27 29  '$ Calcium 8.9 - 10.3 mg/dL 9.4 9.1 9.2  Total Protein 6.5 - 8.1 g/dL - - 6.8  Total Bilirubin 0.3 - 1.2 mg/dL - - 0.3  Alkaline Phos 38 - 126 U/L - - 56  AST 15 - 41 U/L - - 23  ALT 14 - 54 U/L - - 16    PENDING LABS:    DIAGNOSTIC IMAGING:  PET scan: 01/21/17     CT chest: 03/25/17      PATHOLOGY:     ASSESSMENT & PLAN:  Cancer Staging Adenocarcinoma of lung [facility]) Staging form: Lung, AJCC 7th Edition - Clinical: Stage IA - Signed by Baird Cancer, PA-C on 02/27/2014 - Pathologic stage from  04/26/2017: Stage IV (T2b(2), N3, M1a) - Signed by Baird Cancer, PA-C on 04/26/2017   PLAN: -Labs reviewed. Proceed with cycle 4 as scheduled today. -Reviewed her restaging CT chest/abdomen/pelvis with her in detail. She's had a good response with resolution of right lower lobe lesion and nodules. Unchanged ill-defined nonspecific left lower lobe ground-glass opacity and stable mildly prominent mediastinal lymph nodes. -Return to clinic for follow-up with cycle 5 of Tecentriq in 3 weeks.  All questions were answered to patient's stated satisfaction. Encouraged patient to call with any new  concerns or questions before her next visit to the cancer center and we can certain see her sooner, if needed.     Twana First, MD

## 2017-07-30 NOTE — Treatment Plan (Signed)
Ok to treat with Tecentriq  Today. Pt on last day of prednsone and will stop taking per Dr Talbert Cage

## 2017-08-05 DIAGNOSIS — C3490 Malignant neoplasm of unspecified part of unspecified bronchus or lung: Secondary | ICD-10-CM | POA: Diagnosis not present

## 2017-08-05 DIAGNOSIS — E1165 Type 2 diabetes mellitus with hyperglycemia: Secondary | ICD-10-CM | POA: Diagnosis not present

## 2017-08-05 DIAGNOSIS — J449 Chronic obstructive pulmonary disease, unspecified: Secondary | ICD-10-CM | POA: Diagnosis not present

## 2017-08-05 DIAGNOSIS — J188 Other pneumonia, unspecified organism: Secondary | ICD-10-CM | POA: Diagnosis not present

## 2017-08-05 DIAGNOSIS — J9611 Chronic respiratory failure with hypoxia: Secondary | ICD-10-CM | POA: Diagnosis not present

## 2017-08-07 ENCOUNTER — Other Ambulatory Visit: Payer: Self-pay | Admitting: Cardiology

## 2017-08-12 DIAGNOSIS — B351 Tinea unguium: Secondary | ICD-10-CM | POA: Diagnosis not present

## 2017-08-12 DIAGNOSIS — E1151 Type 2 diabetes mellitus with diabetic peripheral angiopathy without gangrene: Secondary | ICD-10-CM | POA: Diagnosis not present

## 2017-08-12 DIAGNOSIS — E1140 Type 2 diabetes mellitus with diabetic neuropathy, unspecified: Secondary | ICD-10-CM | POA: Diagnosis not present

## 2017-08-12 DIAGNOSIS — L110 Acquired keratosis follicularis: Secondary | ICD-10-CM | POA: Diagnosis not present

## 2017-08-19 ENCOUNTER — Other Ambulatory Visit (HOSPITAL_COMMUNITY): Payer: Self-pay | Admitting: Oncology

## 2017-08-20 ENCOUNTER — Ambulatory Visit (HOSPITAL_COMMUNITY): Payer: Medicare Other

## 2017-08-20 ENCOUNTER — Encounter (HOSPITAL_BASED_OUTPATIENT_CLINIC_OR_DEPARTMENT_OTHER): Payer: Medicare Other | Admitting: Oncology

## 2017-08-20 ENCOUNTER — Encounter (HOSPITAL_COMMUNITY): Payer: Medicare Other | Attending: Hematology & Oncology

## 2017-08-20 ENCOUNTER — Encounter (HOSPITAL_COMMUNITY): Payer: Self-pay

## 2017-08-20 VITALS — BP 116/49 | HR 70 | Temp 99.0°F | Resp 18 | Wt 179.9 lb

## 2017-08-20 DIAGNOSIS — Z7189 Other specified counseling: Secondary | ICD-10-CM

## 2017-08-20 DIAGNOSIS — C3490 Malignant neoplasm of unspecified part of unspecified bronchus or lung: Secondary | ICD-10-CM

## 2017-08-20 DIAGNOSIS — Z5112 Encounter for antineoplastic immunotherapy: Secondary | ICD-10-CM | POA: Diagnosis not present

## 2017-08-20 DIAGNOSIS — C3412 Malignant neoplasm of upper lobe, left bronchus or lung: Secondary | ICD-10-CM | POA: Diagnosis not present

## 2017-08-20 DIAGNOSIS — C3492 Malignant neoplasm of unspecified part of left bronchus or lung: Secondary | ICD-10-CM | POA: Insufficient documentation

## 2017-08-20 DIAGNOSIS — C3431 Malignant neoplasm of lower lobe, right bronchus or lung: Secondary | ICD-10-CM

## 2017-08-20 DIAGNOSIS — R062 Wheezing: Secondary | ICD-10-CM | POA: Diagnosis not present

## 2017-08-20 LAB — CBC WITH DIFFERENTIAL/PLATELET
Basophils Absolute: 0 10*3/uL (ref 0.0–0.1)
Basophils Relative: 0 %
Eosinophils Absolute: 0.2 10*3/uL (ref 0.0–0.7)
Eosinophils Relative: 3 %
HCT: 37.7 % (ref 36.0–46.0)
Hemoglobin: 11.7 g/dL — ABNORMAL LOW (ref 12.0–15.0)
Lymphocytes Relative: 17 %
Lymphs Abs: 1.4 10*3/uL (ref 0.7–4.0)
MCH: 29.6 pg (ref 26.0–34.0)
MCHC: 31 g/dL (ref 30.0–36.0)
MCV: 95.4 fL (ref 78.0–100.0)
Monocytes Absolute: 0.7 10*3/uL (ref 0.1–1.0)
Monocytes Relative: 9 %
Neutro Abs: 5.8 10*3/uL (ref 1.7–7.7)
Neutrophils Relative %: 71 %
Platelets: 206 10*3/uL (ref 150–400)
RBC: 3.95 MIL/uL (ref 3.87–5.11)
RDW: 14.6 % (ref 11.5–15.5)
WBC: 8.1 10*3/uL (ref 4.0–10.5)

## 2017-08-20 LAB — COMPREHENSIVE METABOLIC PANEL
ALT: 19 U/L (ref 14–54)
AST: 21 U/L (ref 15–41)
Albumin: 3.6 g/dL (ref 3.5–5.0)
Alkaline Phosphatase: 50 U/L (ref 38–126)
Anion gap: 8 (ref 5–15)
BUN: 15 mg/dL (ref 6–20)
CO2: 30 mmol/L (ref 22–32)
Calcium: 9.3 mg/dL (ref 8.9–10.3)
Chloride: 99 mmol/L — ABNORMAL LOW (ref 101–111)
Creatinine, Ser: 1.38 mg/dL — ABNORMAL HIGH (ref 0.44–1.00)
GFR calc Af Amer: 40 mL/min — ABNORMAL LOW (ref >60)
GFR calc non Af Amer: 35 mL/min — ABNORMAL LOW (ref >60)
Glucose, Bld: 209 mg/dL — ABNORMAL HIGH (ref 65–99)
Potassium: 4.7 mmol/L (ref 3.5–5.1)
Sodium: 137 mmol/L (ref 135–145)
Total Bilirubin: 0.5 mg/dL (ref 0.3–1.2)
Total Protein: 7 g/dL (ref 6.5–8.1)

## 2017-08-20 LAB — TSH: TSH: 1.089 u[IU]/mL (ref 0.350–4.500)

## 2017-08-20 MED ORDER — SODIUM CHLORIDE 0.9 % IV SOLN
1200.0000 mg | Freq: Once | INTRAVENOUS | Status: AC
  Administered 2017-08-20: 1200 mg via INTRAVENOUS
  Filled 2017-08-20: qty 20

## 2017-08-20 MED ORDER — SODIUM CHLORIDE 0.9% FLUSH
10.0000 mL | INTRAVENOUS | Status: DC | PRN
  Administered 2017-08-20: 10 mL
  Filled 2017-08-20: qty 10

## 2017-08-20 MED ORDER — HEPARIN SOD (PORK) LOCK FLUSH 100 UNIT/ML IV SOLN
500.0000 [IU] | Freq: Once | INTRAVENOUS | Status: AC | PRN
  Administered 2017-08-20: 500 [IU]
  Filled 2017-08-20: qty 5

## 2017-08-20 MED ORDER — SODIUM CHLORIDE 0.9 % IV SOLN
Freq: Once | INTRAVENOUS | Status: AC
  Administered 2017-08-20: 12:00:00 via INTRAVENOUS

## 2017-08-20 NOTE — Patient Instructions (Signed)
[facility] Discharge Instructions  RECOMMENDATIONS MADE BY THE CONSULTANT AND ANY TEST RESULTS WILL BE SENT TO YOUR REFERRING PHYSICIAN.  You saw Dr. Zhou today.  Thank you for choosing [facility] at [facility] to provide your oncology and hematology care.  To afford each patient quality time with our provider, please arrive at least 15 minutes before your scheduled appointment time.    If you have a lab appointment with the Cancer Center please come in thru the  Main Entrance and check in at the main information desk  You need to re-schedule your appointment should you arrive 10 or more minutes late.  We strive to give you quality time with our providers, and arriving late affects you and other patients whose appointments are after yours.  Also, if you no show three or more times for appointments you may be dismissed from the clinic at the providers discretion.     Again, thank you for choosing [facility].  Our hope is that these requests will decrease the amount of time that you wait before being seen by our physicians.       _____________________________________________________________  Should you have questions after your visit to [facility], please contact our office at [phone] between the hours of 8:30 a.m. and 4:30 p.m.  Voicemails left after 4:30 p.m. will not be returned until the following business day.  For prescription refill requests, have your pharmacy contact our office.       Resources For Cancer Patients and their Caregivers ? American Cancer Society: Can assist with transportation, wigs, general needs, runs Look Good Feel Better.        1-888-227-6333 ? Cancer Care: Provides financial assistance, online support groups, medication/co-pay assistance.  1-800-813-HOPE (4673) ? [facility] Assists [place] cancer patients and their families through  emotional , educational and financial support.  336-427-4357 ? [place] DSS Where to apply for food stamps, Medicaid and utility assistance. 336-342-1394 ? [facility]: Transportation to medical appointments. 336-347-2287 ? Social Security Administration: May apply for disability if have a Stage IV cancer. 336-342-7796 1-800-772-1213 ? [place] Aging, Disability and Transit Services: Assists with nutrition, care and transit needs. 336-349-2343  Cancer Center Support Programs: @10RELATIVEDAYS@ > Cancer Support Group  2nd Tuesday of the month 1pm-2pm, Journey Room  > Creative Journey  3rd Tuesday of the month 1130am-1pm, Journey Room  > Look Good Feel Better  1st Wednesday of the month 10am-12 noon, Journey Room (Call American Cancer Society to register 1-800-395-5775)    

## 2017-08-20 NOTE — Patient Instructions (Signed)
[facility] Discharge Instructions for Patients Receiving Chemotherapy  Today you received the following chemotherapy agents tecentriq.  Call for any questions or concerns.   To help prevent nausea and vomiting after your treatment, we encourage you to take your nausea medication.     If you develop nausea and vomiting that is not controlled by your nausea medication, call the clinic.   BELOW ARE SYMPTOMS THAT SHOULD BE REPORTED IMMEDIATELY:  *FEVER GREATER THAN 100.5 F  *CHILLS WITH OR WITHOUT FEVER  NAUSEA AND VOMITING THAT IS NOT CONTROLLED WITH YOUR NAUSEA MEDICATION  *UNUSUAL SHORTNESS OF BREATH  *UNUSUAL BRUISING OR BLEEDING  TENDERNESS IN MOUTH AND THROAT WITH OR WITHOUT PRESENCE OF ULCERS  *URINARY PROBLEMS  *BOWEL PROBLEMS  UNUSUAL RASH Items with * indicate a potential emergency and should be followed up as soon as possible.  Feel free to call the clinic you have any questions or concerns. The clinic phone number is [phone].  Please show the [place] at check-in to the Emergency Department and triage nurse.

## 2017-08-20 NOTE — Progress Notes (Signed)
[place], [place] [address]   CLINIC:  Medical Oncology/Hematology  PCP:  Rosita Fire, MD [place] [address] [phone]   REASON FOR VISIT:  New/recurrent adenocarcinoma of the left and right lung; h/o Stage IA NSCLC s/p resection in 2009.   CURRENT THERAPY: Initial work-up    BRIEF ONCOLOGIC HISTORY:  Oncology History   stage I A., non-small cell lung cancer, consistent with a poorly differentiated adenocarcinoma, status post resection 07/07/2008 in [place].       Adenocarcinoma of lung ([place])   07/07/2008 Surgery    Performed in [place], [place].  Consistent with poorly differentiated adenocarcinoma.      02/27/2014 Imaging    CT of chest demonstrating a RLL solid nodular component measuring 9 mm at the site of previous ground-glass opacity. Slight increase in size of an anterior mediastinal node measuring 1.1 cm with stable 1 cm pretracheal lymph node. Consider PET      03/20/2014 Imaging    PET scan- No hypermetabolic pulmonary lesions to suggest recurrent or metastatic pulmonary disease.  Weakly positive left internal mammary lymph node and left axillary lymph node (? inflammatory process involving the left breast).      11/07/2015 Imaging    CT chest- 1. No evidence of metastatic involvement of the lungs. The previously noted small noncalcified lung nodules are stable. 2. Vague focal opacity in the right lower lobe may represent scarring and is stable. Continued followup however is recommended in 1 year.      01/08/2017 Imaging    CT chest- 1. New nodules along a thick walled bullous lesion in the right lower lobe, suspicious for malignancy. 2. New fairly large region of confluent ground-glass opacity posteriorly in the remaining left lung, low grade adenocarcinoma is not excluded and there is mild new nodularity along the upper margin of this process. 3. Stable biapical pleuroparenchymal  scarring with several stable nodules in the left lung. 4. Borderline adenopathy in the mediastinum, essentially stable from prior. 5. Coronary, aortic arch, and branch vessel atherosclerotic vascular disease. 6. Small hiatal hernia. 7. Hypodense left thyroid lesion. If not previously worked up, thyroid ultrasound could be employed.      01/21/2017 PET scan    1. New hypermetabolic adenopathy in the mediastinum and hila, with progressive hypermetabolic ground-glass density posteriorly in the left lower lobe with faint nodular components, and some low-grade hypermetabolic activity in ground-glass opacity dependently in the right upper lobe. Although the appearance is concerning for malignancy potentially with a lymphangitic component in the left lower lobe, the unusual configuration in the appearance of the ground-glass opacities could conceivably represent an atypical granulomatous infectious process as an alternative. This is not classic obvious recurrence with well-defined mass or nodules. 2. There is some ill-defined clustered nodularity in the right lower lobe only faintly metabolic, maximum SUV 3.0. 3. Overall I believe that this process require surveillance to differentiate an inflammatory/infectious condition from recurrent malignancy. The adenopathy is certainly concerning. 4. No findings of malignancy in the neck, abdomen/pelvis, or skeleton. 5. Sigmoid colon diverticulosis. 6. Emphysema. 7. Chronic ethmoid sinusitis.      03/25/2017 Imaging    CT chest- Stable 5.9 cm left lower lobe ground-glass opacity, hypermetabolic on prior PET, worrisome for primary bronchogenic neoplasm such as low-grade adenocarcinoma.  Stable 2.2 cm cavitary lesion in the central right lower lobe with surrounding hypermetabolic nodularity measuring up to 1.8 cm, worrisome for primary bronchogenic neoplasm such as  squamous cell carcinoma.  Hypermetabolic mediastinal lymph nodes measuring up  to 11 mm short axis, as above. Bilateral hilar regions are hypermetabolic on PET but poorly evaluated on unenhanced CT.  Emphysema and aortic atherosclerosis.      04/23/2017 Procedure    Bronchoscopy and EBUS with mediastinal lymph node aspirations by Dr. Roxan Hockey.      04/26/2017 Pathology Results    1. Lung, biopsy, Left upper lobe - ADENOCARCINOMA. - SEE COMMENT. 2. Lung, biopsy, Right lower lobe - ADENOCARCINOMA. - SEE COMMENT. 3. Lung, biopsy, Right lower lobe target 2 - ATYPICAL CELLS. - SEE COMMENT.      04/26/2017 Relapse/Recurrence         05/14/2017 Pathology Results    PDL1 NEGATIVE.  TPS 0%.      05/19/2017 Pathology Results    [employer] testing- MS-stable.  TMB 6 Muts/Mb (Intermediate).  STK11 E293*.  Negative for EGFR, KRAS, ALK, BRAF, MET, RET, ERBB2, and ROS1.  Potential treatment options: Atezolizumab/Durvalumab/Nivolumab/Pembrolizumab for TMB score.      07/27/2017 Imaging    CT C/A/P: IMPRESSION: Resolution of right lower lobe lesion and nodules.  Unchanged ill-defined nonspecific left lower lobe ground-glass opacity and stable mildly prominent mediastinal lymph nodes.  No evidence of metastatic disease or acute abnormality within the abdomen or pelvis.  Aortic Atherosclerosis (ICD10-I70.0) and Emphysema (ICD10-J43.9).        HISTORY OF PRESENT ILLNESS:  (From Kirby Crigler, PA-C's last note on 12/05/16)   On 01/07/17, she had CT chest for surveillance of her lung cancer; new nodules were noted and concerning for malignancy.  She was recently admitted on 01/10/17-01/13/17 for COPD exacerbation; she was discharged on oral steroids at that time.  On 01/21/17, she underwent PET scan revealing new hypermetabolic adenopathy in the mediastinum and hilum, with progressive hypermetabolic groundglass density in the left lower lobe; there is mention of possible concern of malignancy with a lymphangitic component in the left lower lobe, but this may also  represent an atypical granulomatous infectious process as well; there were no findings of malignancy in the neck, abdomen/pelvis, or skeleton. It was recommended by cardiothoracic surgery that we repeat imaging in 2-3 months after hospitalization to re-assess lesions noted to CT and PET scans.     INTERVAL HISTORY: Patient presents for follow-up today and to get cycle 4 of tecentriq today. She states she has been doing well except for having diarrhea since she drank the contrast for her restaging CT scans on 07/27/17. She has been tolerating her tecentriq treatments very well without any side effects. She denies any fatigue, chest pain, shortness of breath above her baseline, abdominal pain, focal weakness. Appetite is good. No weight loss.    REVIEW OF SYSTEMS:  Review of Systems  Constitutional: Negative for appetite change, chills, fatigue and fever.  HENT:   Negative for hearing loss, lump/mass, mouth sores, sore throat, tinnitus and trouble swallowing.   Eyes: Negative.  Negative for eye problems and icterus.  Respiratory: Positive for shortness of breath (worse today). Negative for chest tightness, cough, hemoptysis and wheezing.   Cardiovascular: Negative.  Negative for chest pain, leg swelling and palpitations.  Gastrointestinal: Negative.  Negative for abdominal distention, abdominal pain, blood in stool, constipation, diarrhea, nausea and vomiting.  Endocrine: Negative.  Negative for hot flashes.  Genitourinary: Negative.  Negative for difficulty urinating, dysuria, frequency, hematuria and vaginal bleeding.   Musculoskeletal: Negative.  Negative for arthralgias and neck pain.  Skin: Negative.  Negative for itching and rash.  Neurological:  Negative for dizziness, headaches and speech difficulty.  Hematological: Negative for adenopathy. Does not bruise/bleed easily.  Psychiatric/Behavioral: Negative.  Negative for confusion. The patient is not nervous/anxious.      PAST  MEDICAL/SURGICAL HISTORY:  Past Medical History:  Diagnosis Date  . Adenocarcinoma of lung ([place])    Left lung 2009, resected  . Anginal pain ([place])   . Arthritis   . Back pain   . CHF (congestive heart failure) ([place])   . COPD (chronic obstructive pulmonary disease) ([place])   . Diverticulitis   . Dyslipidemia   . Essential hypertension   . H/O ventral hernia   . Non-obstructive CAD    a. 04/2015 NSTEMI/Cath: LAD 10p, LCX 39m RCA 349m20d, EF 35-40 w/ apical ballooning.  . On home O2    3L N/C  . Osteoporosis   . Osteoporosis 11/04/2015   Managed by Dr. FaLegrand Rams . Parkinson's disease ([facility]  . Shortness of breath   . Takotsubo cardiomyopathy    a. 04/2015 Echo: EF 45-50%, mid-dist anterior/apical/inferoapical HK w/ hyperdynamic base. Gr 1 DD, mild AI, mild-mod MR, triv TR, PASP 4884m;  b. 04/2015 LV gram: Ef 35-40% w/ apical ballooning.  . Type II diabetes mellitus ([facility] . Ventricular bigeminy    a. 04/2015 in setting of NSTEMI/Takotsubo.   Past Surgical History:  Procedure Laterality Date  . ABDOMINAL HYSTERECTOMY    . CARDIAC CATHETERIZATION N/A 04/17/2015   Procedure: Left Heart Cath and Coronary Angiography;  Surgeon: ThoTroy SineD;  Location: [facility];  Service: Cardiovascular;  Laterality: N/A;  . CHOLECYSTECTOMY    . COLONOSCOPY N/A 09/18/2014   Procedure: COLONOSCOPY;  Surgeon: SanDanie BinderD;  Location: [facility];  Service: Endoscopy;  Laterality: N/A;  8:30 AM - moved to 10:30 - CRosendo Gros notify pt  . ECTOPIC PREGNANCY SURGERY    . INCISIONAL HERNIA REPAIR N/A 08/26/2013   Procedure: HERNIA REPAIR INCISIONAL WITH MESH;  Surgeon: MarJamesetta SoD;  Location: [facility] ORS;  Service: General;  Laterality: N/A;  . IR FLUORO GUIDE PORT INSERTION RIGHT  04/30/2017  . IR US [place] VASC ACCESS RIGHT  04/30/2017  . LUNG CANCER SURGERY    . VIDEO BRONCHOSCOPY WITH ENDOBRONCHIAL NAVIGATION N/A 04/23/2017   Procedure: VIDEO BRONCHOSCOPY WITH ENDOBRONCHIAL NAVIGATION;   Surgeon: HenMelrose NakayamaD;  [facility];  Laterality: N/A;  . VIDEO BRONCHOSCOPY WITH ENDOBRONCHIAL ULTRASOUND N/A 04/23/2017   Procedure: VIDEO BRONCHOSCOPY WITH ENDOBRONCHIAL ULTRASOUND;  Surgeon: HenMelrose NakayamaD;  [facility];  Laterality: N/A;     SOCIAL HISTORY:  Social History   Social History  . Marital status: Widowed    Spouse name: N/A  . Number of children: N/A  . Years of education: N/A   Occupational History  . Not on file.   Social History Main Topics  . Smoking status: Former Smoker    Years: 20.00    Types: Cigarettes    Quit date: 08/13/2006  . Smokeless tobacco: Never Used  . Alcohol use No  . Drug use: No  . Sexual activity: Not Currently    Birth control/ protection: Surgical   Other Topics Concern  . Not on file   Social History Narrative  . No narrative on file    FAMILY HISTORY:  Family History  Problem Relation Age of Onset  . Diabetes Mother   . Hypertension Mother   . Diabetes  Father   . Asthma Unknown   . Cancer Unknown   . Heart attack Sister        X2  . Colon cancer Neg Hx     CURRENT MEDICATIONS:  Outpatient Encounter Prescriptions as of 08/20/2017  Medication Sig  . albuterol (PROAIR HFA) 108 (90 BASE) MCG/ACT inhaler Inhale 2 puffs into the lungs every 4 (four) hours as needed for wheezing or shortness of breath.  Marland Kitchen alendronate (FOSAMAX) 70 MG tablet Take 70 mg by mouth every Friday.   Marland Kitchen amoxicillin-clavulanate (AUGMENTIN) 500-125 MG tablet Take 1 tablet (500 mg total) by mouth 3 (three) times daily.  Marland Kitchen aspirin EC 81 MG tablet Take 81 mg by mouth daily.  Huey Bienenstock (TECENTRIQ IV) Inject into the vein. Every 3 weeks  . atorvastatin (LIPITOR) 40 MG tablet Take 40 mg by mouth at bedtime.   Marland Kitchen buPROPion (WELLBUTRIN SR) 150 MG 12 hr tablet Take 150 mg by mouth 2 (two) times daily.  . cholecalciferol (VITAMIN D) 1000 units tablet Take 1,000 Units by mouth daily.  .  furosemide (LASIX) 40 MG tablet Take 40 mg by mouth daily. Pt is able to take one additional tablet daily for weight increases of 3lbs in a day or 5lbs in a week.  . gabapentin (NEURONTIN) 300 MG capsule Take 600 mg by mouth at bedtime.   . Garlic 778 MG TABS Take 300 mg by mouth daily.  Marland Kitchen ipratropium-albuterol (DUONEB) 0.5-2.5 (3) MG/3ML SOLN Inhale 3 mLs into the lungs every 6 (six) hours as needed (for wheezing/shortness of breath).   Marland Kitchen KLOR-CON 10 10 MEQ tablet TAKE 1 TABLET (10 MEQ TOTAL) BY MOUTH DAILY. *FURTHER REFILLS NEED TO BE AUTHORIZED BY PCP*  . LANTUS SOLOSTAR 100 UNIT/ML Solostar Pen Inject 15 Units into the skin at bedtime.   . lidocaine-prilocaine (EMLA) cream Apply to affected area once  . linagliptin (TRADJENTA) 5 MG TABS tablet Take 5 mg by mouth daily.  Marland Kitchen loratadine (CLARITIN) 10 MG tablet Take 10 mg by mouth daily.  Marland Kitchen losartan (COZAAR) 50 MG tablet Take 50 mg by mouth daily.  . meclizine (ANTIVERT) 25 MG tablet Take 25 mg by mouth daily.   . metoprolol succinate (TOPROL-XL) 25 MG 24 hr tablet Take 1 tablet (25 mg total) by mouth daily.  . nitroGLYCERIN (NITROSTAT) 0.4 MG SL tablet Place 0.4 mg under the tongue every 5 (five) minutes as needed for chest pain.  . Omega-3 Fatty Acids (FISH OIL PO) Take 1 capsule by mouth daily.  Marland Kitchen omeprazole (PRILOSEC) 40 MG capsule Take 40 mg by mouth daily.  . ondansetron (ZOFRAN) 8 MG tablet Take 1 tablet (8 mg total) by mouth 2 (two) times daily as needed (Nausea or vomiting).  . polyethylene glycol powder (GLYCOLAX/MIRALAX) powder Take 1 capful daily as needed.  . predniSONE (STERAPRED UNI-PAK 21 TAB) 10 MG (21) TBPK tablet 4 tab po daily for 2 days then 3 tab po daily for 2 days, 2 tab po daily for 2 days then 1 tab po daily for 2 days  . prochlorperazine (COMPAZINE) 10 MG tablet Take 1 tablet (10 mg total) by mouth every 6 (six) hours as needed (Nausea or vomiting).  . pyridoxine (B-6) 100 MG tablet Take 100 mg by mouth daily.   Marland Kitchen  rOPINIRole (REQUIP) 0.5 MG tablet Take 0.5 mg by mouth 3 (three) times daily.  . sodium chloride (OCEAN) 0.65 % SOLN nasal spray Place 1 spray into both nostrils every 3 (three) hours as needed  for congestion.   . traMADol (ULTRAM) 50 MG tablet Take 50 mg by mouth every 12 (twelve) hours as needed for moderate pain.   Marland Kitchen zolpidem (AMBIEN) 5 MG tablet Take 5 mg by mouth at bedtime as needed for sleep.   No facility-administered encounter medications on file as of 08/20/2017.     ALLERGIES:  Allergies  Allergen Reactions  . Lisinopril Cough     PHYSICAL EXAM:  ECOG Performance status: 1-2  BP 156/59 P82 RR 18 T98.5 O2 sat 97%  Physical Exam  Constitutional: She is oriented to person, place, and time and well-developed, well-nourished, and in no distress. No distress.  oxygen dependent with portable oxygen   HENT:  Head: Normocephalic and atraumatic.  Mouth/Throat: Oropharynx is clear and moist. No oropharyngeal exudate.  Eyes: Pupils are equal, round, and reactive to light. Conjunctivae are normal. No scleral icterus.  Neck: Normal range of motion. Neck supple. No JVD present.  Cardiovascular: Normal rate, regular rhythm and normal heart sounds.  Exam reveals no gallop and no friction rub.   No murmur heard. Pulmonary/Chest: Effort normal. No respiratory distress. She has wheezes (in all lung fields). She has no rhonchi. She has no rales.  Abdominal: Soft. Bowel sounds are normal. She exhibits no distension. There is no tenderness. There is no rebound and no guarding.  Musculoskeletal: Normal range of motion. She exhibits no edema or tenderness.  Lymphadenopathy:    She has no cervical adenopathy.  Neurological: She is alert and oriented to person, place, and time. No cranial nerve deficit.  Skin: Skin is warm and dry. No rash noted. No erythema. No pallor.  Psychiatric: Mood, memory, affect and judgment normal.  Nursing note and vitals reviewed.    LABORATORY DATA:  I have  reviewed the labs as listed.  CBC    Component Value Date/Time   WBC 8.1 08/20/2017 1100   RBC 3.95 08/20/2017 1100   HGB 11.7 (L) 08/20/2017 1100   HCT 37.7 08/20/2017 1100   PLT 206 08/20/2017 1100   MCV 95.4 08/20/2017 1100   MCH 29.6 08/20/2017 1100   MCHC 31.0 08/20/2017 1100   RDW 14.6 08/20/2017 1100   LYMPHSABS 1.4 08/20/2017 1100   MONOABS 0.7 08/20/2017 1100   EOSABS 0.2 08/20/2017 1100   BASOSABS 0.0 08/20/2017 1100   CMP Latest Ref Rng & Units 08/20/2017 07/30/2017 07/21/2017  Glucose 65 - 99 mg/dL 209(H) 179(H) 166(H)  BUN 6 - 20 mg/dL 15 22(H) 33(H)  Creatinine 0.44 - 1.00 mg/dL 1.38(H) 1.53(H) 1.51(H)  Sodium 135 - 145 mmol/L 137 137 137  Potassium 3.5 - 5.1 mmol/L 4.7 5.0 4.4  Chloride 101 - 111 mmol/L 99(L) 99(L) 98(L)  CO2 22 - 32 mmol/L _0 Calcium 8.9 - 10.3 mg/dL 9.3 8.8(L) 9.4  Total Protein 6.5 - 8.1 g/dL 7.0 6.6 -  Total Bilirubin 0.3 - 1.2 mg/dL 0.5 0.5 -  Alkaline Phos 38 - 126 U/L 50 49 -  AST 15 - 41 U/L 21 23 -  ALT 14 - 54 U/L 19 21 -    PENDING LABS:    DIAGNOSTIC IMAGING:  PET scan: 01/21/17     CT chest: 03/25/17      PATHOLOGY:     ASSESSMENT & PLAN:  Cancer Staging Adenocarcinoma of lung ([place]) Staging form: Lung, AJCC 7th Edition - Clinical: Stage IA - Signed by Baird Cancer, PA-C on 02/27/2014 - Pathologic stage from 04/26/2017: Stage IV (T2b(2), N3, M1a) - Signed by  Baird Cancer, PA-C on 04/26/2017   PLAN: -Labs reviewed. Proceed with cycle 5 as scheduled today. -Advised her to use her nebulizer treatments 4 times today since she is wheezing in all her lung fields today. -Return to clinic for follow-up with cycle 6 of Tecentriq in 3 weeks.  All questions were answered to patient's stated satisfaction. Encouraged patient to call with any new concerns or questions before her next visit to the cancer center and we can certain see her sooner, if needed.     Twana First, MD

## 2017-08-20 NOTE — Progress Notes (Signed)
To treatment area for labs, treatment, and oncology visit. Patient stated wheezing, fatigue, weakness, and diarrhea one time this week.  Dr. Talbert Cage in the room for visit.    Labs reviewed with Dr. Talbert Cage and ok to treat today.  TSH still pending but ok to treat.   Patient tolerated chemotherapy with no complaints voiced.  Port site clean and dry with no bruising or swelling noted at site.  VSS and discharged in wheelchair with family.

## 2017-08-25 DIAGNOSIS — J449 Chronic obstructive pulmonary disease, unspecified: Secondary | ICD-10-CM | POA: Diagnosis not present

## 2017-09-10 ENCOUNTER — Encounter (HOSPITAL_BASED_OUTPATIENT_CLINIC_OR_DEPARTMENT_OTHER): Payer: Medicare Other | Admitting: Adult Health

## 2017-09-10 ENCOUNTER — Encounter (HOSPITAL_COMMUNITY): Payer: Medicare Other | Attending: Hematology & Oncology

## 2017-09-10 ENCOUNTER — Encounter (HOSPITAL_COMMUNITY): Payer: Self-pay | Admitting: Adult Health

## 2017-09-10 VITALS — BP 119/43 | HR 79 | Temp 97.8°F | Resp 18 | Ht 64.0 in | Wt 177.4 lb

## 2017-09-10 VITALS — BP 122/52 | HR 73 | Temp 98.3°F | Resp 18 | Wt 177.6 lb

## 2017-09-10 DIAGNOSIS — Z7189 Other specified counseling: Secondary | ICD-10-CM

## 2017-09-10 DIAGNOSIS — C3492 Malignant neoplasm of unspecified part of left bronchus or lung: Secondary | ICD-10-CM | POA: Diagnosis not present

## 2017-09-10 DIAGNOSIS — R21 Rash and other nonspecific skin eruption: Secondary | ICD-10-CM | POA: Diagnosis not present

## 2017-09-10 DIAGNOSIS — C3431 Malignant neoplasm of lower lobe, right bronchus or lung: Secondary | ICD-10-CM

## 2017-09-10 DIAGNOSIS — Z5112 Encounter for antineoplastic immunotherapy: Secondary | ICD-10-CM

## 2017-09-10 DIAGNOSIS — C3412 Malignant neoplasm of upper lobe, left bronchus or lung: Secondary | ICD-10-CM

## 2017-09-10 DIAGNOSIS — L282 Other prurigo: Secondary | ICD-10-CM

## 2017-09-10 DIAGNOSIS — C3490 Malignant neoplasm of unspecified part of unspecified bronchus or lung: Secondary | ICD-10-CM

## 2017-09-10 LAB — CBC WITH DIFFERENTIAL/PLATELET
Basophils Absolute: 0 10*3/uL (ref 0.0–0.1)
Basophils Relative: 0 %
Eosinophils Absolute: 0.4 10*3/uL (ref 0.0–0.7)
Eosinophils Relative: 5 %
HCT: 37.6 % (ref 36.0–46.0)
Hemoglobin: 11.7 g/dL — ABNORMAL LOW (ref 12.0–15.0)
Lymphocytes Relative: 20 %
Lymphs Abs: 1.5 10*3/uL (ref 0.7–4.0)
MCH: 29.5 pg (ref 26.0–34.0)
MCHC: 31.1 g/dL (ref 30.0–36.0)
MCV: 94.7 fL (ref 78.0–100.0)
Monocytes Absolute: 0.9 10*3/uL (ref 0.1–1.0)
Monocytes Relative: 11 %
Neutro Abs: 4.8 10*3/uL (ref 1.7–7.7)
Neutrophils Relative %: 64 %
Platelets: 178 10*3/uL (ref 150–400)
RBC: 3.97 MIL/uL (ref 3.87–5.11)
RDW: 14.6 % (ref 11.5–15.5)
WBC: 7.6 10*3/uL (ref 4.0–10.5)

## 2017-09-10 LAB — COMPREHENSIVE METABOLIC PANEL
ALT: 18 U/L (ref 14–54)
AST: 22 U/L (ref 15–41)
Albumin: 3.7 g/dL (ref 3.5–5.0)
Alkaline Phosphatase: 57 U/L (ref 38–126)
Anion gap: 7 (ref 5–15)
BUN: 19 mg/dL (ref 6–20)
CO2: 28 mmol/L (ref 22–32)
Calcium: 9.8 mg/dL (ref 8.9–10.3)
Chloride: 98 mmol/L — ABNORMAL LOW (ref 101–111)
Creatinine, Ser: 1.49 mg/dL — ABNORMAL HIGH (ref 0.44–1.00)
GFR calc Af Amer: 37 mL/min — ABNORMAL LOW (ref >60)
GFR calc non Af Amer: 32 mL/min — ABNORMAL LOW (ref >60)
Glucose, Bld: 166 mg/dL — ABNORMAL HIGH (ref 65–99)
Potassium: 4.1 mmol/L (ref 3.5–5.1)
Sodium: 133 mmol/L — ABNORMAL LOW (ref 135–145)
Total Bilirubin: 0.6 mg/dL (ref 0.3–1.2)
Total Protein: 7.1 g/dL (ref 6.5–8.1)

## 2017-09-10 MED ORDER — SODIUM CHLORIDE 0.9 % IV SOLN
1200.0000 mg | Freq: Once | INTRAVENOUS | Status: AC
  Administered 2017-09-10: 1200 mg via INTRAVENOUS
  Filled 2017-09-10: qty 20

## 2017-09-10 MED ORDER — SODIUM CHLORIDE 0.9% FLUSH
10.0000 mL | INTRAVENOUS | Status: DC | PRN
  Administered 2017-09-10: 10 mL
  Filled 2017-09-10: qty 10

## 2017-09-10 MED ORDER — CLOTRIMAZOLE-BETAMETHASONE 1-0.05 % EX CREA: 1.0000 "application " | TOPICAL_CREAM | Freq: Two times a day (BID) | CUTANEOUS | 0 refills | Status: DC

## 2017-09-10 MED ORDER — HEPARIN SOD (PORK) LOCK FLUSH 100 UNIT/ML IV SOLN
500.0000 [IU] | Freq: Once | INTRAVENOUS | Status: AC | PRN
  Administered 2017-09-10: 500 [IU]

## 2017-09-10 MED ORDER — SODIUM CHLORIDE 0.9 % IV SOLN
Freq: Once | INTRAVENOUS | Status: AC
  Administered 2017-09-10: 13:00:00 via INTRAVENOUS

## 2017-09-10 NOTE — Patient Instructions (Signed)
[facility] Discharge Instructions for Patients Receiving Chemotherapy   Beginning January 23rd 2017 lab work for the Cancer Center will be done in the  Main lab at [facility] on 1st floor. If you have a lab appointment with the Cancer Center please come in thru the  Main Entrance and check in at the main information desk   Today you received the following chemotherapy agents   To help prevent nausea and vomiting after your treatment, we encourage you to take your nausea medication     If you develop nausea and vomiting, or diarrhea that is not controlled by your medication, call the clinic.  The clinic phone number is [phone]. Office hours are Monday-Friday 8:30am-5:00pm.  BELOW ARE SYMPTOMS THAT SHOULD BE REPORTED IMMEDIATELY:  *FEVER GREATER THAN 101.0 F  *CHILLS WITH OR WITHOUT FEVER  NAUSEA AND VOMITING THAT IS NOT CONTROLLED WITH YOUR NAUSEA MEDICATION  *UNUSUAL SHORTNESS OF BREATH  *UNUSUAL BRUISING OR BLEEDING  TENDERNESS IN MOUTH AND THROAT WITH OR WITHOUT PRESENCE OF ULCERS  *URINARY PROBLEMS  *BOWEL PROBLEMS  UNUSUAL RASH Items with * indicate a potential emergency and should be followed up as soon as possible. If you have an emergency after office hours please contact your primary care physician or go to the nearest emergency department.  Please call the clinic during office hours if you have any questions or concerns.   You may also contact the Patient Navigator at [phone] should you have any questions or need assistance in obtaining follow up care.      Resources For Cancer Patients and their Caregivers ? American Cancer Society: Can assist with transportation, wigs, general needs, runs Look Good Feel Better.        1-888-227-6333 ? Cancer Care: Provides financial assistance, online support groups, medication/co-pay assistance.  1-800-813-HOPE (4673) ? [facility] Assists [place] cancer  patients and their families through emotional , educational and financial support.  336-427-4357 ? [place] DSS Where to apply for food stamps, Medicaid and utility assistance. 336-342-1394 ? [facility]: Transportation to medical appointments. 336-347-2287 ? Social Security Administration: May apply for disability if have a Stage IV cancer. 336-342-7796 1-800-772-1213 ? [place] Aging, Disability and Transit Services: Assists with nutrition, care and transit needs. 336-349-2343         

## 2017-09-10 NOTE — Progress Notes (Signed)
[place], [place] [address]   CLINIC:  Medical Oncology/Hematology  PCP:  Rosita Fire, MD [place] [address] [phone]   REASON FOR VISIT:  Follow-up for Stage IV lung cancer   CURRENT THERAPY: Tecentriq every 21 days, beginning 05/28/17.   BRIEF ONCOLOGIC HISTORY:    Adenocarcinoma of lung ([place])   07/07/2008 Surgery    Performed in [place], [place].  Consistent with poorly differentiated adenocarcinoma.      02/27/2014 Imaging    CT of chest demonstrating a RLL solid nodular component measuring 9 mm at the site of previous ground-glass opacity. Slight increase in size of an anterior mediastinal node measuring 1.1 cm with stable 1 cm pretracheal lymph node. Consider PET      03/20/2014 Imaging    PET scan- No hypermetabolic pulmonary lesions to suggest recurrent or metastatic pulmonary disease.  Weakly positive left internal mammary lymph node and left axillary lymph node (? inflammatory process involving the left breast).      11/07/2015 Imaging    CT chest- 1. No evidence of metastatic involvement of the lungs. The previously noted small noncalcified lung nodules are stable. 2. Vague focal opacity in the right lower lobe may represent scarring and is stable. Continued followup however is recommended in 1 year.      01/08/2017 Imaging    CT chest- 1. New nodules along a thick walled bullous lesion in the right lower lobe, suspicious for malignancy. 2. New fairly large region of confluent ground-glass opacity posteriorly in the remaining left lung, low grade adenocarcinoma is not excluded and there is mild new nodularity along the upper margin of this process. 3. Stable biapical pleuroparenchymal scarring with several stable nodules in the left lung. 4. Borderline adenopathy in the mediastinum, essentially stable from prior. 5. Coronary, aortic arch, and branch vessel atherosclerotic  vascular disease. 6. Small hiatal hernia. 7. Hypodense left thyroid lesion. If not previously worked up, thyroid ultrasound could be employed.      01/21/2017 PET scan    1. New hypermetabolic adenopathy in the mediastinum and hila, with progressive hypermetabolic ground-glass density posteriorly in the left lower lobe with faint nodular components, and some low-grade hypermetabolic activity in ground-glass opacity dependently in the right upper lobe. Although the appearance is concerning for malignancy potentially with a lymphangitic component in the left lower lobe, the unusual configuration in the appearance of the ground-glass opacities could conceivably represent an atypical granulomatous infectious process as an alternative. This is not classic obvious recurrence with well-defined mass or nodules. 2. There is some ill-defined clustered nodularity in the right lower lobe only faintly metabolic, maximum SUV 3.0. 3. Overall I believe that this process require surveillance to differentiate an inflammatory/infectious condition from recurrent malignancy. The adenopathy is certainly concerning. 4. No findings of malignancy in the neck, abdomen/pelvis, or skeleton. 5. Sigmoid colon diverticulosis. 6. Emphysema. 7. Chronic ethmoid sinusitis.      03/25/2017 Imaging    CT chest- Stable 5.9 cm left lower lobe ground-glass opacity, hypermetabolic on prior PET, worrisome for primary bronchogenic neoplasm such as low-grade adenocarcinoma.  Stable 2.2 cm cavitary lesion in the central right lower lobe with surrounding hypermetabolic nodularity measuring up to 1.8 cm, worrisome for primary bronchogenic neoplasm such as squamous cell carcinoma.  Hypermetabolic mediastinal lymph nodes measuring up to 11 mm short axis, as above. Bilateral hilar regions are hypermetabolic on PET but poorly evaluated on unenhanced CT.  Emphysema and aortic atherosclerosis.  04/23/2017 Procedure     Bronchoscopy and EBUS with mediastinal lymph node aspirations by Dr. Roxan Hockey.      04/26/2017 Pathology Results    1. Lung, biopsy, Left upper lobe - ADENOCARCINOMA. - SEE COMMENT. 2. Lung, biopsy, Right lower lobe - ADENOCARCINOMA. - SEE COMMENT. 3. Lung, biopsy, Right lower lobe target 2 - ATYPICAL CELLS. - SEE COMMENT.      04/26/2017 Relapse/Recurrence         05/14/2017 Pathology Results    PDL1 NEGATIVE.  TPS 0%.      05/19/2017 Pathology Results    FoundationOne testing- MS-stable.  TMB 6 Muts/Mb (Intermediate).  STK11 E293*.  Negative for EGFR, KRAS, ALK, BRAF, MET, RET, ERBB2, and ROS1.  Potential treatment options: Atezolizumab/Durvalumab/Nivolumab/Pembrolizumab for TMB score.      07/27/2017 Imaging    CT C/A/P: IMPRESSION: Resolution of right lower lobe lesion and nodules.  Unchanged ill-defined nonspecific left lower lobe ground-glass opacity and stable mildly prominent mediastinal lymph nodes.  No evidence of metastatic disease or acute abnormality within the abdomen or pelvis.  Aortic Atherosclerosis (ICD10-I70.0) and Emphysema (ICD10-J43.9).        HISTORY OF PRESENT ILLNESS:  (From Dr. Laverle Patter last note on 08/20/17)   -Bilat lung biopsy performed in 04/2017 by Dr. Roxan Hockey confirmed bilat adenocarcinoma  -Started Tecentriq 05/28/17   INTERVAL HISTORY:  [name] presents today for follow-up for Stage IV lung cancer.   Here today with her daughter and due for next cycle of Tecentriq.   Overall, she tells me she has been feeling "okay."  Her appetite and energy levels are both 50% and "about the same." She supplements her diet with Glucerna daily.    She feels like her shortness of breath/dyspnea on exertion has gotten a bit worse in recent weeks.  Her daughter fees like she is wheezing more than normal.  Her O2 requirement has not gone up; she remains on chronic O2 via nasal cannula.  She has chronic cough. Ms. Huntley tells me that she still  remains active "doing almost everything I need to do."  She is still able to bathe herself, do her dishes/housework, dress herself, etc.  She has intermittent tinnitus, which has been chronic. "It sounds like birds chirping in my ears."    She has new rash in her groin and inner thighs that she would like me to take a look at today. The rash is itchy; she has been applying OTC anti-fungal cream, which has not been very helpful for her.  Denies any rash to other parts of her body.    Denies any diarrhea. Bowels are moving well.  She has occasional dizziness, which has been chronic.  Her daughter tells me that she has been falling down a lot. Patient states "sometimes my body just won't get up when I need it to because I'm too weak."  She has a walker and wheelchair, but does not use them all of the time.  She also has reported h/o Parkison's disease and she thinks this is contributing some to her frequent falls; last fall was a couple of weeks ago and her daughter had to help her get up off of the floor. She denies any serious injury or trauma; denies hitting her head; denies syncope.  She reportedly has follow-up with her neurologist on 09/23/17.    Overall, she tells me that she feels well enough for her next cycle of Tecentriq today.    She remains on continuous O2; this is chronic  for her. Denies any worsening shortness of breath or cough; she chronically has these symptoms.  She underwent swallow study, which showed some mild aspiration.  Her swallowing difficulties have reportedly improved.  Endorses dizziness at times; denies headaches. Endorses easy bruising/bleeding. Denies any bowel or bladder changes.   She is here today to review her CT scan results and discuss next steps.    REVIEW OF SYSTEMS:  Review of Systems  Constitutional: Positive for fatigue. Negative for chills and fever.  HENT:   Positive for tinnitus.        Voice is hoarse at times   Eyes: Negative.   Respiratory: Positive  for cough and shortness of breath.   Cardiovascular: Negative.   Gastrointestinal: Negative.   Endocrine: Negative.   Genitourinary: Negative.    Skin: Positive for itching and rash.  Neurological: Positive for dizziness and extremity weakness. Negative for headaches.  Psychiatric/Behavioral: Positive for sleep disturbance (chronic issue for her ).     PAST MEDICAL/SURGICAL HISTORY:  Past Medical History:  Diagnosis Date  . Adenocarcinoma of lung ([facility])    Left lung 2009, resected  . Anginal pain ([place] [facility])   . Arthritis   . Back pain   . CHF (congestive heart failure) ([place])   . COPD (chronic obstructive pulmonary disease) ([facility])   . Diverticulitis   . Dyslipidemia   . Essential hypertension   . H/O ventral hernia   . Non-obstructive CAD    a. 04/2015 NSTEMI/Cath: LAD 10p, LCX 50m RCA 362m20d, EF 35-40 w/ apical ballooning.  . On home O2    3L N/C  . Osteoporosis   . Osteoporosis 11/04/2015   Managed by Dr. FaLegrand Rams . Parkinson's disease ([facility]  . Shortness of breath   . Takotsubo cardiomyopathy    a. 04/2015 Echo: EF 45-50%, mid-dist anterior/apical/inferoapical HK w/ hyperdynamic base. Gr 1 DD, mild AI, mild-mod MR, triv TR, PASP 4818m;  b. 04/2015 LV gram: Ef 35-40% w/ apical ballooning.  . Type II diabetes mellitus ([place] . Ventricular bigeminy    a. 04/2015 in setting of NSTEMI/Takotsubo.   Past Surgical History:  Procedure Laterality Date  . ABDOMINAL HYSTERECTOMY    . CARDIAC CATHETERIZATION N/A 04/17/2015   Procedure: Left Heart Cath and Coronary Angiography;  Surgeon: ThoTroy SineD;  [facility];  Service: Cardiovascular;  Laterality: N/A;  . CHOLECYSTECTOMY    . COLONOSCOPY N/A 09/18/2014   Procedure: COLONOSCOPY;  Surgeon: SanDanie BinderD;  Location: [facility];  Service: Endoscopy;  Laterality: N/A;  8:30 AM - moved to 10:30 - CRosendo Gros notify pt  . ECTOPIC PREGNANCY SURGERY    . INCISIONAL HERNIA REPAIR N/A 08/26/2013   Procedure:  HERNIA REPAIR INCISIONAL WITH MESH;  Surgeon: MarJamesetta SoD;  Location: [facility];  Service: General;  Laterality: N/A;  . IR FLUORO GUIDE PORT INSERTION RIGHT  04/30/2017  . IR US [place] VASC ACCESS RIGHT  04/30/2017  . LUNG CANCER SURGERY    . VIDEO BRONCHOSCOPY WITH ENDOBRONCHIAL NAVIGATION N/A 04/23/2017   Procedure: VIDEO BRONCHOSCOPY WITH ENDOBRONCHIAL NAVIGATION;  Surgeon: HenMelrose NakayamaD;  [facility];  Laterality: N/A;  . VIDEO BRONCHOSCOPY WITH ENDOBRONCHIAL ULTRASOUND N/A 04/23/2017   Procedure: VIDEO BRONCHOSCOPY WITH ENDOBRONCHIAL ULTRASOUND;  Surgeon: HenMelrose NakayamaD;  [facility];  Laterality: N/A;     SOCIAL HISTORY:  Social History   Social History  .  Marital status: Widowed    Spouse name: N/A  . Number of children: N/A  . Years of education: N/A   Occupational History  . Not on file.   Social History Main Topics  . Smoking status: Former Smoker    Years: 20.00    Types: Cigarettes    Quit date: 08/13/2006  . Smokeless tobacco: Never Used  . Alcohol use No  . Drug use: No  . Sexual activity: Not Currently    Birth control/ protection: Surgical   Other Topics Concern  . Not on file   Social History Narrative  . No narrative on file    FAMILY HISTORY:  Family History  Problem Relation Age of Onset  . Diabetes Mother   . Hypertension Mother   . Diabetes Father   . Asthma Unknown   . Cancer Unknown   . Heart attack Sister        X2  . Colon cancer Neg Hx     CURRENT MEDICATIONS:  Outpatient Encounter Prescriptions as of 09/10/2017  Medication Sig  . albuterol (PROAIR HFA) 108 (90 BASE) MCG/ACT inhaler Inhale 2 puffs into the lungs every 4 (four) hours as needed for wheezing or shortness of breath.  Marland Kitchen alendronate (FOSAMAX) 70 MG tablet Take 70 mg by mouth every Friday.   Marland Kitchen amoxicillin-clavulanate (AUGMENTIN) 500-125 MG tablet Take 1 tablet (500 mg total) by mouth 3 (three) times  daily.  Marland Kitchen aspirin EC 81 MG tablet Take 81 mg by mouth daily.  Huey Bienenstock (TECENTRIQ IV) Inject into the vein. Every 3 weeks  . atorvastatin (LIPITOR) 40 MG tablet Take 40 mg by mouth at bedtime.   Marland Kitchen buPROPion (WELLBUTRIN SR) 150 MG 12 hr tablet Take 150 mg by mouth 2 (two) times daily.  . cholecalciferol (VITAMIN D) 1000 units tablet Take 1,000 Units by mouth daily.  . furosemide (LASIX) 40 MG tablet Take 40 mg by mouth daily. Pt is able to take one additional tablet daily for weight increases of 3lbs in a day or 5lbs in a week.  . gabapentin (NEURONTIN) 300 MG capsule Take 600 mg by mouth at bedtime.   . Garlic 322 MG TABS Take 300 mg by mouth daily.  Marland Kitchen ipratropium-albuterol (DUONEB) 0.5-2.5 (3) MG/3ML SOLN Inhale 3 mLs into the lungs every 6 (six) hours as needed (for wheezing/shortness of breath).   Marland Kitchen KLOR-CON 10 10 MEQ tablet TAKE 1 TABLET (10 MEQ TOTAL) BY MOUTH DAILY. *FURTHER REFILLS NEED TO BE AUTHORIZED BY PCP*  . LANTUS SOLOSTAR 100 UNIT/ML Solostar Pen Inject 15 Units into the skin at bedtime.   . lidocaine-prilocaine (EMLA) cream Apply to affected area once  . linagliptin (TRADJENTA) 5 MG TABS tablet Take 5 mg by mouth daily.  Marland Kitchen loratadine (CLARITIN) 10 MG tablet Take 10 mg by mouth daily.  Marland Kitchen losartan (COZAAR) 50 MG tablet Take 50 mg by mouth daily.  . meclizine (ANTIVERT) 25 MG tablet Take 25 mg by mouth daily.   . metoprolol succinate (TOPROL-XL) 25 MG 24 hr tablet Take 1 tablet (25 mg total) by mouth daily.  . nitroGLYCERIN (NITROSTAT) 0.4 MG SL tablet Place 0.4 mg under the tongue every 5 (five) minutes as needed for chest pain.  . Omega-3 Fatty Acids (FISH OIL PO) Take 1 capsule by mouth daily.  Marland Kitchen omeprazole (PRILOSEC) 40 MG capsule Take 40 mg by mouth daily.  . ondansetron (ZOFRAN) 8 MG tablet Take 1 tablet (8 mg total) by mouth 2 (two) times daily as needed (  Nausea or vomiting).  . polyethylene glycol powder (GLYCOLAX/MIRALAX) powder Take 1 capful daily as needed.  .  predniSONE (STERAPRED UNI-PAK 21 TAB) 10 MG (21) TBPK tablet 4 tab po daily for 2 days then 3 tab po daily for 2 days, 2 tab po daily for 2 days then 1 tab po daily for 2 days  . prochlorperazine (COMPAZINE) 10 MG tablet Take 1 tablet (10 mg total) by mouth every 6 (six) hours as needed (Nausea or vomiting).  . pyridoxine (B-6) 100 MG tablet Take 100 mg by mouth daily.   Marland Kitchen rOPINIRole (REQUIP) 0.5 MG tablet Take 0.5 mg by mouth 3 (three) times daily.  . sodium chloride (OCEAN) 0.65 % SOLN nasal spray Place 1 spray into both nostrils every 3 (three) hours as needed for congestion.   . traMADol (ULTRAM) 50 MG tablet Take 50 mg by mouth every 12 (twelve) hours as needed for moderate pain.   Marland Kitchen zolpidem (AMBIEN) 5 MG tablet Take 5 mg by mouth at bedtime as needed for sleep.   No facility-administered encounter medications on file as of 09/10/2017.     ALLERGIES:  Allergies  Allergen Reactions  . Lisinopril Cough     PHYSICAL EXAM:  ECOG Performance status: 2 - Symptomatic, requires assistance.   Vitals:   09/10/17 1147  BP: (!) 119/43  Pulse: 79  Resp: 18  Temp: 97.8 F (36.6 C)  SpO2: 96%     Physical Exam  Constitutional: She is oriented to person, place, and time.  Chronically-ill appearing female in no acute distress -Exam performed with patient seated in wheelchair.   HENT:  Head: Normocephalic.  Mouth/Throat: Oropharynx is clear and moist. No oropharyngeal exudate.  Eyes: Pupils are equal, round, and reactive to light. Conjunctivae are normal. No scleral icterus.  Neck: Normal range of motion. Neck supple.  Cardiovascular: Normal rate and regular rhythm.   Pulmonary/Chest: Effort normal. She has wheezes (expiratory wheezes throughout all lung fields ).  O2 via [place] in place (chronic).  Subtle LLL rhonchi appreciated.   Abdominal: Soft. Bowel sounds are normal. There is no tenderness.  Musculoskeletal: Normal range of motion. She exhibits no edema.  Lymphadenopathy:    She  has no cervical adenopathy.  Neurological: She is alert and oriented to person, place, and time.  Subtle bilat hand tremor at times (chronic; pt with reported h/o Parkinson's dz)  Skin: Skin is warm and dry. Rash (inner aspect of bilateral thighs and inguinal folds with red papular rash (most consistent with fungal infection); several areas in different stages of healing. Some mild ulcerations/rash to bilateral outer labia as well. ) noted.  Psychiatric: Mood, memory, affect and judgment normal.  Nursing note and vitals reviewed.    LABORATORY DATA:  I have reviewed the labs as listed.  CBC    Component Value Date/Time   WBC 8.1 08/20/2017 1100   RBC 3.95 08/20/2017 1100   HGB 11.7 (L) 08/20/2017 1100   HCT 37.7 08/20/2017 1100   PLT 206 08/20/2017 1100   MCV 95.4 08/20/2017 1100   MCH 29.6 08/20/2017 1100   MCHC 31.0 08/20/2017 1100   RDW 14.6 08/20/2017 1100   LYMPHSABS 1.4 08/20/2017 1100   MONOABS 0.7 08/20/2017 1100   EOSABS 0.2 08/20/2017 1100   BASOSABS 0.0 08/20/2017 1100   CMP Latest Ref Rng & Units 08/20/2017 07/30/2017 07/21/2017  Glucose 65 - 99 mg/dL 209(H) 179(H) 166(H)  BUN 6 - 20 mg/dL 15 22(H) 33(H)  Creatinine 0.44 - 1.00 mg/dL  1.38(H) 1.53(H) 1.51(H)  Sodium 135 - 145 mmol/L 137 137 137  Potassium 3.5 - 5.1 mmol/L 4.7 5.0 4.4  Chloride 101 - 111 mmol/L 99(L) 99(L) 98(L)  CO2 22 - 32 mmol/L _0 Calcium 8.9 - 10.3 mg/dL 9.3 8.8(L) 9.4  Total Protein 6.5 - 8.1 g/dL 7.0 6.6 -  Total Bilirubin 0.3 - 1.2 mg/dL 0.5 0.5 -  Alkaline Phos 38 - 126 U/L 50 49 -  AST 15 - 41 U/L 21 23 -  ALT 14 - 54 U/L 19 21 -    PENDING LABS:    DIAGNOSTIC IMAGING:  *I have reviewed the following radiologic images and agree with below reports as listed.  Most recent CT chest/abd/pelvis: 07/27/17 CLINICAL DATA:  83 year old female for restaging of lung carcinoma. Currently on chemotherapy.  EXAM: CT CHEST, ABDOMEN, AND PELVIS WITH CONTRAST  TECHNIQUE: Multidetector  CT imaging of the chest, abdomen and pelvis was performed following the standard protocol during bolus administration of intravenous contrast.  CONTRAST:  50m ISOVUE-300 IOPAMIDOL (ISOVUE-300) INJECTION 61%  COMPARISON:  03/25/2017 chest CT, 01/21/2017 PET CT and prior studies  FINDINGS: CT CHEST FINDINGS  Cardiovascular: Mild cardiomegaly and heavy coronary artery atherosclerotic calcifications again noted. Thoracic aortic atherosclerotic calcifications again identified without aneurysm. No pericardial effusion. A right Port-A-Cath with tip in the lower SVC noted.  Mediastinum/Nodes: Mediastinal lymph nodes are unchanged in size as follows (all series 2, short axis):  A 6 mm left paratracheal node (image 13)  A 10 mm high right paratracheal node (image 16)  An 8 mm prevascular node (image 23  An 11 mm low right peritracheal node (image 22).  No new or enlarging lymph nodes are identified.  No other changes noted.  Lungs/Pleura: There has been interval resolution of cavitary lesion and adjacent nodules in the right lower lobe.  A 5.9 x 5.5 cm ill-defined ground-glass opacity within the left lower lobe is unchanged.  No new pulmonary opacities are identified.  Moderate centrilobular emphysema and left partial pneumonectomy changes again identified. There is no evidence of pneumothorax or pleural effusion.  Right apical scarring again noted.  Musculoskeletal: No acute abnormalities or suspicious focal lesions.  CT ABDOMEN PELVIS FINDINGS  Hepatobiliary: The liver is unremarkable. The patient is status post cholecystectomy. No biliary dilatation.  Pancreas: Unremarkable  Spleen: Unremarkable  Adrenals/Urinary Tract: The kidneys, adrenal glands and bladder are unremarkable.  Stomach/Bowel: Colonic diverticulosis noted without evidence of diverticulitis. No evidence of bowel obstruction, definite focal bowel wall thickening for  inflammatory changes.  Vascular/Lymphatic: Aortic atherosclerosis. No enlarged abdominal or pelvic lymph nodes.  Reproductive: Status post hysterectomy. No adnexal masses.  Other: No ascites, pneumoperitoneum or focal collection.  Musculoskeletal: No acute abnormalities or suspicious focal bony lesions. Degenerative changes in the lumbar spine again noted.  IMPRESSION: Resolution of right lower lobe lesion and nodules.  Unchanged ill-defined nonspecific left lower lobe ground-glass opacity and stable mildly prominent mediastinal lymph nodes.  No evidence of metastatic disease or acute abnormality within the abdomen or pelvis.  Aortic Atherosclerosis (ICD10-I70.0) and Emphysema (ICD10-J43.9).   Electronically Signed   By: JMargarette CanadaM.D.   On: 07/27/2017 13:09       PATHOLOGY:  LUL and RLL lung biopsies: 04/23/17    FoundationOne results: 04/23/17         ASSESSMENT & PLAN:   Stage IV lung cancer:  -[place] chest in 12/2016 demonstrated new  nodules to RLL and left lung, concerning for malignancy.  PET scan in 01/2017 revealed new hypermetabolic adenopathy in the mediastinum and hila, with progressive hypermetabolic groundglass of density posteriorly in the left lower lobe, as well as low-grade hypermetabolic activity in groundglass opacity dependently in the right upper lobe; areas concerning for malignancy, but atypical granulomatous infectious process also included on the differential.  Of note, the patient was hospitalized around this time with COPD exacerbation.  Case was discussed with Dr. Luan Pulling (pulmonology) and Dr. Roxan Hockey (Cardio-thoracic surgery); Dr. Roxan Hockey recommended repeat short-interval CT scan in 6-8 weeks from that time. Repeat CT chest imaging in 03/2017 revealed stable bilateral lung lesions both concerning for primary bronchogenic carcinoma. She was sent to Dr.  Roxan Hockey for biopsy/definitive tissue diagnosis. On 04/23/17, underwent biopsies to RLL and LUL lung biopsies; path of both revealed well-differentiated adenocarcinoma.  [facility]One testing revealed MS-Stable, TMB-Intermediate.  She was started on Tecentriq on 05/28/17.    -Here for cycle #6 Tecentriq today.  Her shortness of breath has gotten slightly worse in past few weeks. Discussed with her concerns regarding possibility of immune-mediated pneumonitis. However, upon further discussion, it seems that she is still recovering from recent hospitalization in 07/2017 from pneumonia and has some residual cough with dyspnea on exertion.  Discussed with Dr. Talbert Cage and will proceed with treatment today as planned.   -Will obtain early interval restaging imaging with CT chest/abd/pelvis prior to next cycle given her progressive shortness of breath (in case we need to make changes or hold treatment for immunotherapy-related pneumonitis or progression of disease); orders placed today.   -Return to cancer center in 3 weeks for follow-up and next cycle of therapy.   Groin rash:  -Appears to be fungal on exam.  Does not appear to be rash secondary to immunotherapy.  OTC anti-fungal cream has not been effective.  -E-scribed Clotrimazole/betamethasone ointment to her pharmacy. Encouraged her to let me know if this is ineffective and can e-scribe Diflucan for her.  I suspect topical ointment will be sufficient.  -Encouraged her to keep area dry and clean, as much as able. She struggles with urge/stress urinary incontinence, which is likely contributing to her symptoms.    Falls prevention:  -Noted recent fall at home. Denies any injury/trauma.  Encouraged her to use supportive devices regularly, including walker, wheelchair, and non-skid shoes/socks.        Dispo:  -Restaging CT chest/abd/pelvis in ~2 weeks before next cycle of therapy.  -Return to cancer center in 3 weeks for follow-up with cycle #7  Tecentriq.     All questions were answered to patient's stated satisfaction. Encouraged patient to call with any new concerns or questions before her next visit to the cancer center and we can certain see her sooner, if needed.     Plan of care discussed with Dr. Talbert Cage, who agrees with the above aforementioned.      Mike Craze, NP [place] [phone]

## 2017-09-10 NOTE — Progress Notes (Signed)
Treatment given per orders. Patient tolerated it well without problems. Vitals stable and discharged home from clinic ambulatory. Follow up as scheduled.  

## 2017-09-11 ENCOUNTER — Other Ambulatory Visit (HOSPITAL_COMMUNITY): Payer: Self-pay | Admitting: Oncology

## 2017-09-20 ENCOUNTER — Emergency Department (HOSPITAL_COMMUNITY): Payer: Medicare Other

## 2017-09-20 ENCOUNTER — Other Ambulatory Visit: Payer: Self-pay

## 2017-09-20 ENCOUNTER — Encounter (HOSPITAL_COMMUNITY): Payer: Self-pay | Admitting: Emergency Medicine

## 2017-09-20 ENCOUNTER — Inpatient Hospital Stay (HOSPITAL_COMMUNITY)
Admission: EM | Admit: 2017-09-20 | Discharge: 2017-09-23 | DRG: 190 | Disposition: A | Discharge status: Home / Self Care | Payer: Medicare Other | Attending: Internal Medicine | Admitting: Internal Medicine

## 2017-09-20 DIAGNOSIS — E1122 Type 2 diabetes mellitus with diabetic chronic kidney disease: Secondary | ICD-10-CM | POA: Diagnosis present

## 2017-09-20 DIAGNOSIS — I2510 Atherosclerotic heart disease of native coronary artery without angina pectoris: Secondary | ICD-10-CM | POA: Diagnosis present

## 2017-09-20 DIAGNOSIS — M810 Age-related osteoporosis without current pathological fracture: Secondary | ICD-10-CM | POA: Diagnosis present

## 2017-09-20 DIAGNOSIS — I5032 Chronic diastolic (congestive) heart failure: Secondary | ICD-10-CM | POA: Diagnosis present

## 2017-09-20 DIAGNOSIS — J9611 Chronic respiratory failure with hypoxia: Secondary | ICD-10-CM | POA: Diagnosis present

## 2017-09-20 DIAGNOSIS — C3490 Malignant neoplasm of unspecified part of unspecified bronchus or lung: Secondary | ICD-10-CM | POA: Diagnosis not present

## 2017-09-20 DIAGNOSIS — Z888 Allergy status to other drugs, medicaments and biological substances status: Secondary | ICD-10-CM

## 2017-09-20 DIAGNOSIS — Z79891 Long term (current) use of opiate analgesic: Secondary | ICD-10-CM

## 2017-09-20 DIAGNOSIS — R0600 Dyspnea, unspecified: Secondary | ICD-10-CM | POA: Diagnosis not present

## 2017-09-20 DIAGNOSIS — Z90710 Acquired absence of both cervix and uterus: Secondary | ICD-10-CM

## 2017-09-20 DIAGNOSIS — C3492 Malignant neoplasm of unspecified part of left bronchus or lung: Secondary | ICD-10-CM | POA: Diagnosis present

## 2017-09-20 DIAGNOSIS — I10 Essential (primary) hypertension: Secondary | ICD-10-CM | POA: Diagnosis not present

## 2017-09-20 DIAGNOSIS — G20 Parkinson's disease: Secondary | ICD-10-CM | POA: Diagnosis present

## 2017-09-20 DIAGNOSIS — J189 Pneumonia, unspecified organism: Secondary | ICD-10-CM | POA: Diagnosis present

## 2017-09-20 DIAGNOSIS — N179 Acute kidney failure, unspecified: Secondary | ICD-10-CM | POA: Diagnosis not present

## 2017-09-20 DIAGNOSIS — I130 Hypertensive heart and chronic kidney disease with heart failure and stage 1 through stage 4 chronic kidney disease, or unspecified chronic kidney disease: Secondary | ICD-10-CM | POA: Diagnosis present

## 2017-09-20 DIAGNOSIS — Z833 Family history of diabetes mellitus: Secondary | ICD-10-CM | POA: Diagnosis not present

## 2017-09-20 DIAGNOSIS — I252 Old myocardial infarction: Secondary | ICD-10-CM

## 2017-09-20 DIAGNOSIS — Z794 Long term (current) use of insulin: Secondary | ICD-10-CM | POA: Diagnosis not present

## 2017-09-20 DIAGNOSIS — N183 Chronic kidney disease, stage 3 unspecified: Secondary | ICD-10-CM | POA: Diagnosis present

## 2017-09-20 DIAGNOSIS — Z87891 Personal history of nicotine dependence: Secondary | ICD-10-CM

## 2017-09-20 DIAGNOSIS — Z9981 Dependence on supplemental oxygen: Secondary | ICD-10-CM

## 2017-09-20 DIAGNOSIS — Z8249 Family history of ischemic heart disease and other diseases of the circulatory system: Secondary | ICD-10-CM

## 2017-09-20 DIAGNOSIS — Z79899 Other long term (current) drug therapy: Secondary | ICD-10-CM

## 2017-09-20 DIAGNOSIS — Z9049 Acquired absence of other specified parts of digestive tract: Secondary | ICD-10-CM | POA: Diagnosis not present

## 2017-09-20 DIAGNOSIS — J441 Chronic obstructive pulmonary disease with (acute) exacerbation: Secondary | ICD-10-CM | POA: Diagnosis not present

## 2017-09-20 DIAGNOSIS — J181 Lobar pneumonia, unspecified organism: Secondary | ICD-10-CM | POA: Diagnosis not present

## 2017-09-20 DIAGNOSIS — C3491 Malignant neoplasm of unspecified part of right bronchus or lung: Secondary | ICD-10-CM | POA: Diagnosis not present

## 2017-09-20 DIAGNOSIS — E785 Hyperlipidemia, unspecified: Secondary | ICD-10-CM | POA: Diagnosis present

## 2017-09-20 DIAGNOSIS — E119 Type 2 diabetes mellitus without complications: Secondary | ICD-10-CM

## 2017-09-20 LAB — COMPREHENSIVE METABOLIC PANEL
ALT: 20 U/L (ref 14–54)
AST: 25 U/L (ref 15–41)
Albumin: 4.1 g/dL (ref 3.5–5.0)
Alkaline Phosphatase: 58 U/L (ref 38–126)
Anion gap: 11 (ref 5–15)
BUN: 24 mg/dL — ABNORMAL HIGH (ref 6–20)
CO2: 28 mmol/L (ref 22–32)
Calcium: 9.9 mg/dL (ref 8.9–10.3)
Chloride: 101 mmol/L (ref 101–111)
Creatinine, Ser: 1.65 mg/dL — ABNORMAL HIGH (ref 0.44–1.00)
GFR calc Af Amer: 33 mL/min — ABNORMAL LOW (ref >60)
GFR calc non Af Amer: 28 mL/min — ABNORMAL LOW (ref >60)
Glucose, Bld: 112 mg/dL — ABNORMAL HIGH (ref 65–99)
Potassium: 4.6 mmol/L (ref 3.5–5.1)
Sodium: 140 mmol/L (ref 135–145)
Total Bilirubin: 0.6 mg/dL (ref 0.3–1.2)
Total Protein: 7.6 g/dL (ref 6.5–8.1)

## 2017-09-20 LAB — CBC WITH DIFFERENTIAL/PLATELET
Basophils Absolute: 0 10*3/uL (ref 0.0–0.1)
Basophils Relative: 0 %
Eosinophils Absolute: 0.5 10*3/uL (ref 0.0–0.7)
Eosinophils Relative: 5 %
HCT: 40.4 % (ref 36.0–46.0)
Hemoglobin: 12.8 g/dL (ref 12.0–15.0)
Lymphocytes Relative: 18 %
Lymphs Abs: 1.8 10*3/uL (ref 0.7–4.0)
MCH: 29.8 pg (ref 26.0–34.0)
MCHC: 31.7 g/dL (ref 30.0–36.0)
MCV: 94 fL (ref 78.0–100.0)
Monocytes Absolute: 1.2 10*3/uL — ABNORMAL HIGH (ref 0.1–1.0)
Monocytes Relative: 13 %
Neutro Abs: 6.1 10*3/uL (ref 1.7–7.7)
Neutrophils Relative %: 64 %
Platelets: 203 10*3/uL (ref 150–400)
RBC: 4.3 MIL/uL (ref 3.87–5.11)
RDW: 15.3 % (ref 11.5–15.5)
WBC: 9.7 10*3/uL (ref 4.0–10.5)

## 2017-09-20 MED ORDER — MECLIZINE HCL 12.5 MG PO TABS
25.0000 mg | ORAL_TABLET | Freq: Every day | ORAL | Status: DC
  Administered 2017-09-21 – 2017-09-23 (×3): 25 mg via ORAL
  Filled 2017-09-20 (×3): qty 2

## 2017-09-20 MED ORDER — VITAMIN B-6 50 MG PO TABS
100.0000 mg | ORAL_TABLET | Freq: Every day | ORAL | Status: DC
  Administered 2017-09-21 – 2017-09-23 (×3): 100 mg via ORAL
  Filled 2017-09-20: qty 1
  Filled 2017-09-20 (×3): qty 2

## 2017-09-20 MED ORDER — ONDANSETRON HCL 4 MG PO TABS: 4.0000 mg | ORAL_TABLET | Freq: Four times a day (QID) | ORAL | Status: DC | PRN

## 2017-09-20 MED ORDER — INSULIN ASPART 100 UNIT/ML ~~LOC~~ SOLN
0.0000 [IU] | Freq: Three times a day (TID) | SUBCUTANEOUS | Status: DC
  Administered 2017-09-21: 1 [IU] via SUBCUTANEOUS
  Administered 2017-09-21: 2 [IU] via SUBCUTANEOUS
  Administered 2017-09-21 – 2017-09-22 (×2): 3 [IU] via SUBCUTANEOUS
  Administered 2017-09-22: 2 [IU] via SUBCUTANEOUS
  Administered 2017-09-22: 5 [IU] via SUBCUTANEOUS
  Administered 2017-09-23: 2 [IU] via SUBCUTANEOUS
  Administered 2017-09-23: 7 [IU] via SUBCUTANEOUS

## 2017-09-20 MED ORDER — INSULIN GLARGINE 100 UNIT/ML ~~LOC~~ SOLN
15.0000 [IU] | Freq: Every day | SUBCUTANEOUS | Status: DC
  Administered 2017-09-21 (×2): 15 [IU] via SUBCUTANEOUS
  Filled 2017-09-20 (×3): qty 0.15

## 2017-09-20 MED ORDER — POLYETHYLENE GLYCOL 3350 17 GM/SCOOP PO POWD
17.0000 g | Freq: Every day | ORAL | Status: DC | PRN
  Filled 2017-09-20: qty 255

## 2017-09-20 MED ORDER — TRAMADOL HCL 50 MG PO TABS: 50.0000 mg | ORAL_TABLET | Freq: Two times a day (BID) | ORAL | Status: DC | PRN

## 2017-09-20 MED ORDER — GABAPENTIN 300 MG PO CAPS
600.0000 mg | ORAL_CAPSULE | Freq: Every day | ORAL | Status: DC
  Administered 2017-09-21 – 2017-09-22 (×3): 600 mg via ORAL
  Filled 2017-09-20 (×3): qty 2

## 2017-09-20 MED ORDER — LORATADINE 10 MG PO TABS
10.0000 mg | ORAL_TABLET | Freq: Every day | ORAL | Status: DC
  Administered 2017-09-21 – 2017-09-23 (×3): 10 mg via ORAL
  Filled 2017-09-20 (×3): qty 1

## 2017-09-20 MED ORDER — METOPROLOL SUCCINATE ER 25 MG PO TB24
25.0000 mg | ORAL_TABLET | Freq: Every day | ORAL | Status: DC
  Administered 2017-09-21 – 2017-09-23 (×3): 25 mg via ORAL
  Filled 2017-09-20 (×3): qty 1

## 2017-09-20 MED ORDER — PANTOPRAZOLE SODIUM 40 MG PO TBEC
40.0000 mg | DELAYED_RELEASE_TABLET | Freq: Every day | ORAL | Status: DC
  Administered 2017-09-21 – 2017-09-23 (×3): 40 mg via ORAL
  Filled 2017-09-20 (×3): qty 1

## 2017-09-20 MED ORDER — ROPINIROLE HCL 1 MG PO TABS
0.5000 mg | ORAL_TABLET | Freq: Two times a day (BID) | ORAL | Status: DC
  Administered 2017-09-21 – 2017-09-23 (×5): 0.5 mg via ORAL
  Filled 2017-09-20 (×7): qty 1

## 2017-09-20 MED ORDER — SODIUM CHLORIDE 0.9% FLUSH: 3.0000 mL | INTRAVENOUS | Status: DC | PRN

## 2017-09-20 MED ORDER — FUROSEMIDE 40 MG PO TABS
40.0000 mg | ORAL_TABLET | Freq: Every day | ORAL | Status: DC
  Administered 2017-09-21: 40 mg via ORAL
  Filled 2017-09-20 (×2): qty 1

## 2017-09-20 MED ORDER — IPRATROPIUM-ALBUTEROL 0.5-2.5 (3) MG/3ML IN SOLN
3.0000 mL | Freq: Four times a day (QID) | RESPIRATORY_TRACT | Status: DC
  Administered 2017-09-21: 3 mL via RESPIRATORY_TRACT
  Filled 2017-09-20: qty 3

## 2017-09-20 MED ORDER — SODIUM CHLORIDE 0.9% FLUSH
3.0000 mL | Freq: Two times a day (BID) | INTRAVENOUS | Status: DC
  Administered 2017-09-21 (×3): 3 mL via INTRAVENOUS

## 2017-09-20 MED ORDER — SALINE SPRAY 0.65 % NA SOLN
1.0000 | NASAL | Status: DC | PRN
  Administered 2017-09-21: 1 via NASAL
  Filled 2017-09-20: qty 44

## 2017-09-20 MED ORDER — OMEGA-3-ACID ETHYL ESTERS 1 G PO CAPS
1.0000 g | ORAL_CAPSULE | Freq: Every day | ORAL | Status: DC
  Administered 2017-09-21 – 2017-09-23 (×3): 1 g via ORAL
  Filled 2017-09-20 (×3): qty 1

## 2017-09-20 MED ORDER — METHYLPREDNISOLONE SODIUM SUCC 125 MG IJ SOLR
125.0000 mg | Freq: Once | INTRAMUSCULAR | Status: AC
  Administered 2017-09-20: 125 mg via INTRAVENOUS
  Filled 2017-09-20: qty 2

## 2017-09-20 MED ORDER — ALBUTEROL SULFATE (2.5 MG/3ML) 0.083% IN NEBU
2.5000 mg | INHALATION_SOLUTION | Freq: Once | RESPIRATORY_TRACT | Status: AC
  Administered 2017-09-20: 2.5 mg via RESPIRATORY_TRACT
  Filled 2017-09-20: qty 3

## 2017-09-20 MED ORDER — ACETAMINOPHEN 650 MG RE SUPP: 650.0000 mg | Freq: Four times a day (QID) | RECTAL | Status: DC | PRN

## 2017-09-20 MED ORDER — HEPARIN SODIUM (PORCINE) 5000 UNIT/ML IJ SOLN
5000.0000 [IU] | Freq: Three times a day (TID) | INTRAMUSCULAR | Status: DC
  Administered 2017-09-21 – 2017-09-23 (×8): 5000 [IU] via SUBCUTANEOUS
  Filled 2017-09-20 (×8): qty 1

## 2017-09-20 MED ORDER — ONDANSETRON HCL 4 MG/2ML IJ SOLN: 4.0000 mg | Freq: Four times a day (QID) | INTRAMUSCULAR | Status: DC | PRN

## 2017-09-20 MED ORDER — HYDROCODONE-ACETAMINOPHEN 5-325 MG PO TABS
1.0000 | ORAL_TABLET | ORAL | Status: DC | PRN
  Administered 2017-09-21: 2 via ORAL
  Filled 2017-09-20: qty 2

## 2017-09-20 MED ORDER — VITAMIN D 1000 UNITS PO TABS
1000.0000 [IU] | ORAL_TABLET | Freq: Every day | ORAL | Status: DC
  Administered 2017-09-21 – 2017-09-23 (×3): 1000 [IU] via ORAL
  Filled 2017-09-20 (×3): qty 1

## 2017-09-20 MED ORDER — BUPROPION HCL ER (SR) 150 MG PO TB12
150.0000 mg | ORAL_TABLET | Freq: Two times a day (BID) | ORAL | Status: DC
  Administered 2017-09-21 – 2017-09-23 (×6): 150 mg via ORAL
  Filled 2017-09-20 (×6): qty 1

## 2017-09-20 MED ORDER — ACETAMINOPHEN 325 MG PO TABS: 650.0000 mg | ORAL_TABLET | Freq: Four times a day (QID) | ORAL | Status: DC | PRN

## 2017-09-20 MED ORDER — INSULIN ASPART 100 UNIT/ML ~~LOC~~ SOLN
0.0000 [IU] | Freq: Every day | SUBCUTANEOUS | Status: DC
  Administered 2017-09-21 – 2017-09-22 (×2): 2 [IU] via SUBCUTANEOUS

## 2017-09-20 MED ORDER — ALBUTEROL SULFATE (2.5 MG/3ML) 0.083% IN NEBU
2.5000 mg | INHALATION_SOLUTION | RESPIRATORY_TRACT | Status: DC | PRN
  Filled 2017-09-20: qty 3

## 2017-09-20 MED ORDER — LOSARTAN POTASSIUM 50 MG PO TABS
50.0000 mg | ORAL_TABLET | Freq: Every day | ORAL | Status: DC
  Administered 2017-09-21: 50 mg via ORAL
  Filled 2017-09-20 (×2): qty 1

## 2017-09-20 MED ORDER — ZOLPIDEM TARTRATE 5 MG PO TABS
5.0000 mg | ORAL_TABLET | Freq: Every evening | ORAL | Status: DC | PRN
  Administered 2017-09-21 – 2017-09-22 (×3): 5 mg via ORAL
  Filled 2017-09-20 (×3): qty 1

## 2017-09-20 MED ORDER — ASPIRIN EC 81 MG PO TBEC
81.0000 mg | DELAYED_RELEASE_TABLET | Freq: Every day | ORAL | Status: DC
  Administered 2017-09-21 – 2017-09-23 (×3): 81 mg via ORAL
  Filled 2017-09-20 (×3): qty 1

## 2017-09-20 MED ORDER — IPRATROPIUM-ALBUTEROL 0.5-2.5 (3) MG/3ML IN SOLN
3.0000 mL | Freq: Once | RESPIRATORY_TRACT | Status: AC
  Administered 2017-09-20: 3 mL via RESPIRATORY_TRACT
  Filled 2017-09-20: qty 3

## 2017-09-20 MED ORDER — ATORVASTATIN CALCIUM 40 MG PO TABS
40.0000 mg | ORAL_TABLET | Freq: Every day | ORAL | Status: DC
  Administered 2017-09-21 – 2017-09-22 (×2): 40 mg via ORAL
  Filled 2017-09-20 (×2): qty 1

## 2017-09-20 MED ORDER — SODIUM CHLORIDE 0.9 % IV SOLN
250.0000 mL | INTRAVENOUS | Status: DC | PRN
  Administered 2017-09-21: 250 mL via INTRAVENOUS

## 2017-09-20 MED ORDER — METHYLPREDNISOLONE SODIUM SUCC 40 MG IJ SOLR
40.0000 mg | Freq: Three times a day (TID) | INTRAMUSCULAR | Status: DC
  Administered 2017-09-21 – 2017-09-23 (×8): 40 mg via INTRAVENOUS
  Filled 2017-09-20 (×8): qty 1

## 2017-09-20 NOTE — ED Provider Notes (Signed)
[facility] DEPT Provider Note   CSN: [id number] Arrival date & time: 09/20/17  2015     History   Chief Complaint Chief Complaint  Patient presents with  . Shortness of Breath    HPI Angela Lawson is a 83 y.o. female.  Patient complains of shortness of breath and cough and wheezing.   The history is provided by the patient. No language interpreter was used.  Shortness of Breath  This is a recurrent problem. The problem occurs continuously.The current episode started 6 to 12 hours ago. The problem has not changed since onset.Associated symptoms include wheezing. Pertinent negatives include no fever, no headaches, no cough, no chest pain, no abdominal pain and no rash. It is unknown what precipitated the problem. She has tried ipratropium inhalers for the symptoms. The treatment provided mild relief.    Past Medical History:  Diagnosis Date  . Adenocarcinoma of lung ([place])    Left lung 2009, resected  . Anginal pain ([place])   . Arthritis   . Back pain   . CHF (congestive heart failure) ([place])   . COPD (chronic obstructive pulmonary disease) ([place])   . Diverticulitis   . Dyslipidemia   . Essential hypertension   . H/O ventral hernia   . Non-obstructive CAD    a. 04/2015 NSTEMI/Cath: LAD 10p, LCX 36m, RCA 57m, 20d, EF 35-40 w/ apical ballooning.  . On home O2    3L N/C  . Osteoporosis   . Osteoporosis 11/04/2015   Managed by Dr. Legrand Rams   . Parkinson's disease ([place])   . Shortness of breath   . Takotsubo cardiomyopathy    a. 04/2015 Echo: EF 45-50%, mid-dist anterior/apical/inferoapical HK w/ hyperdynamic base. Gr 1 DD, mild AI, mild-mod MR, triv TR, PASP 76mmHg;  b. 04/2015 LV gram: Ef 35-40% w/ apical ballooning.  . Type II diabetes mellitus ([place])   . Ventricular bigeminy    a. 04/2015 in setting of NSTEMI/Takotsubo.    Patient Active Problem List   Diagnosis Date Noted  . Community acquired pneumonia of left lung 07/19/2017  . Critical lower limb ischemia 07/07/2017    . Counseling regarding advanced care planning and goals of care 05/15/2017  . Acute renal injury ([place]) 04/04/2017  . Chest pain 04/04/2017  . Nausea and vomiting 04/04/2017  . COPD with acute exacerbation ([place]) 04/25/2016  . Acute on chronic respiratory failure with hypoxia ([place]) 04/25/2016  . SOB (shortness of breath)   . Osteoporosis 11/04/2015  . CHF (congestive heart failure) ([place]) 08/01/2015  . CHF exacerbation ([place]) 07/09/2015  . Essential hypertension 04/19/2015  . Type II diabetes mellitus ([place]) 04/19/2015  . Dyslipidemia 04/19/2015  . History of lung cancer 04/19/2015  . Takotsubo cardiomyopathy 04/19/2015  . Ventricular bigeminy 04/19/2015  . NSTEMI (non-ST elevated myocardial infarction) ([place]) 04/19/2015  . Stress-induced cardiomyopathy 04/18/2015  . Elevated troponin 04/16/2015  . COPD exacerbation ([place]) 04/16/2015  . CKD (chronic kidney disease) stage 3, GFR 30-59 ml/min ([facility]) 04/16/2015  . Herpes genitalis in women 11/24/2014  . Toe fracture 09/28/2014  . Non-traumatic tear of right rotator cuff 11/30/2013  . Pain in joint, shoulder region 11/30/2013  . Muscle weakness (generalized) 11/30/2013  . Bursitis of left shoulder 02/09/2013  . Adenocarcinoma of lung ([place]) 08/20/2012    Past Surgical History:  Procedure Laterality Date  . ABDOMINAL HYSTERECTOMY    . CARDIAC CATHETERIZATION N/A 04/17/2015   Procedure: Left Heart Cath and Coronary Angiography;  Surgeon: Troy Sine, MD;  Location: [facility]  CV LAB;  Service: Cardiovascular;  Laterality: N/A;  . CHOLECYSTECTOMY    . COLONOSCOPY N/A 09/18/2014   Procedure: COLONOSCOPY;  Surgeon: Danie Binder, MD;  Location: [facility];  Service: Endoscopy;  Laterality: N/A;  8:30 AM - moved to 10:30 Rosendo Gros to notify pt  . ECTOPIC PREGNANCY SURGERY    . INCISIONAL HERNIA REPAIR N/A 08/26/2013   Procedure: HERNIA REPAIR INCISIONAL WITH MESH;  Surgeon: Jamesetta So, MD;  Location: [facility] ORS;  Service: General;   Laterality: N/A;  . IR FLUORO GUIDE PORT INSERTION RIGHT  04/30/2017  . IR US GUIDE VASC ACCESS RIGHT  04/30/2017  . LUNG CANCER SURGERY    . VIDEO BRONCHOSCOPY WITH ENDOBRONCHIAL NAVIGATION N/A 04/23/2017   Procedure: VIDEO BRONCHOSCOPY WITH ENDOBRONCHIAL NAVIGATION;  Surgeon: Melrose Nakayama, MD;  Location: [facility];  Service: Thoracic;  Laterality: N/A;  . VIDEO BRONCHOSCOPY WITH ENDOBRONCHIAL ULTRASOUND N/A 04/23/2017   Procedure: VIDEO BRONCHOSCOPY WITH ENDOBRONCHIAL ULTRASOUND;  Surgeon: Melrose Nakayama, MD;  Location: [facility];  Service: Thoracic;  Laterality: N/A;    OB History    No data available       Home Medications    Prior to Admission medications   Medication Sig Start Date End Date Taking? Authorizing Provider  albuterol (PROAIR HFA) 108 (90 BASE) MCG/ACT inhaler Inhale 2 puffs into the lungs every 4 (four) hours as needed for wheezing or shortness of breath.   Yes [provider]  alendronate (FOSAMAX) 70 MG tablet Take 70 mg by mouth every Friday.    Yes [provider]  aspirin EC 81 MG tablet Take 81 mg by mouth daily.   Yes [provider]  Atezolizumab (TECENTRIQ IV) Inject into the vein. Every 3 weeks   Yes [provider]  atorvastatin (LIPITOR) 40 MG tablet Take 40 mg by mouth at bedtime.    Yes [provider]  buPROPion (WELLBUTRIN SR) 150 MG 12 hr tablet Take 150 mg by mouth 2 (two) times daily.   Yes [provider]  cholecalciferol (VITAMIN D) 1000 units tablet Take 1,000 Units by mouth daily.   Yes [provider]  clotrimazole-betamethasone (LOTRISONE) cream Apply 1 application topically 2 (two) times daily. 09/10/17  Yes Holley Bouche, NP  furosemide (LASIX) 40 MG tablet Take 40 mg by mouth daily. Pt is able to take one additional tablet daily for weight increases of 3lbs in a day or 5lbs in a week.   Yes [provider]  gabapentin (NEURONTIN) 300 MG capsule Take 600 mg by  mouth at bedtime.    Yes [provider]  Garlic 025 MG TABS Take 300 mg by mouth daily.   Yes [provider]  ipratropium-albuterol (DUONEB) 0.5-2.5 (3) MG/3ML SOLN Inhale 3 mLs into the lungs every 6 (six) hours as needed (for wheezing/shortness of breath).    Yes [provider]  KLOR-CON 10 10 MEQ tablet TAKE 1 TABLET (10 MEQ TOTAL) BY MOUTH DAILY. *FURTHER REFILLS NEED TO BE AUTHORIZED BY PCP* 08/11/17  Yes Lorretta Harp, MD  LANTUS SOLOSTAR 100 UNIT/ML Solostar Pen Inject 15 Units into the skin at bedtime.    Yes [provider]  lidocaine-prilocaine (EMLA) cream Apply to affected area once 05/19/17  Yes Brunetta Genera, MD  linagliptin (TRADJENTA) 5 MG TABS tablet Take 5 mg by mouth daily.   Yes [provider]  loratadine (CLARITIN) 10 MG tablet Take 10 mg by mouth daily.   Yes  [provider]  losartan (COZAAR) 50 MG tablet Take 50 mg by mouth daily.   Yes [provider]  meclizine (ANTIVERT) 25 MG tablet Take 25 mg by mouth daily.    Yes [provider]  metoprolol succinate (TOPROL-XL) 25 MG 24 hr tablet Take 1 tablet (25 mg total) by mouth daily. 06/26/15  Yes Jettie Booze, MD  nitroGLYCERIN (NITROSTAT) 0.4 MG SL tablet Place 0.4 mg under the tongue every 5 (five) minutes as needed for chest pain.   Yes [provider]  Omega-3 Fatty Acids (FISH OIL PO) Take 1 capsule by mouth daily.   Yes [provider]  omeprazole (PRILOSEC) 40 MG capsule Take 40 mg by mouth daily.   Yes [provider]  ondansetron (ZOFRAN) 8 MG tablet Take 1 tablet (8 mg total) by mouth 2 (two) times daily as needed (Nausea or vomiting). 05/19/17  Yes Brunetta Genera, MD  OXYGEN Inhale 3 L into the lungs continuous.   Yes [provider]  polyethylene glycol powder (GLYCOLAX/MIRALAX) powder Take 1 capful daily as needed. Patient taking differently: Take 17 g by mouth daily as needed for mild  constipation or moderate constipation. Take 1 capful daily as needed. 06/01/17  Yes Twana First, MD  prochlorperazine (COMPAZINE) 10 MG tablet Take 1 tablet (10 mg total) by mouth every 6 (six) hours as needed (Nausea or vomiting). 05/19/17  Yes Brunetta Genera, MD  pyridoxine (B-6) 100 MG tablet Take 100 mg by mouth daily.    Yes [provider]  rOPINIRole (REQUIP) 0.5 MG tablet Take 0.5 mg by mouth 2 (two) times daily.    Yes [provider]  sodium chloride (OCEAN) 0.65 % SOLN nasal spray Place 1 spray into both nostrils every 3 (three) hours as needed for congestion.    Yes [provider]  traMADol (ULTRAM) 50 MG tablet Take 50 mg by mouth every 12 (twelve) hours as needed for moderate pain.    Yes [provider]  zolpidem (AMBIEN) 5 MG tablet Take 5 mg by mouth at bedtime as needed for sleep.   Yes [provider]    Family History Family History  Problem Relation Age of Onset  . Diabetes Mother   . Hypertension Mother   . Diabetes Father   . Asthma Unknown   . Cancer Unknown   . Heart attack Sister        X2  . Colon cancer Neg Hx     Social History Social History  Substance Use Topics  . Smoking status: Former Smoker    Years: 20.00    Types: Cigarettes    Quit date: 08/13/2006  . Smokeless tobacco: Never Used  . Alcohol use No     Allergies   Lisinopril   Review of Systems Review of Systems  Constitutional: Negative for appetite change, fatigue and fever.  HENT: Negative for congestion, ear discharge and sinus pressure.   Eyes: Negative for discharge.  Respiratory: Positive for shortness of breath and wheezing. Negative for cough.   Cardiovascular: Negative for chest pain.  Gastrointestinal: Negative for abdominal pain and diarrhea.  Genitourinary: Negative for frequency and hematuria.  Musculoskeletal: Negative for back pain.  Skin: Negative for rash.  Neurological: Negative for seizures and headaches.    Psychiatric/Behavioral: Negative for hallucinations.     Physical Exam Updated Vital Signs BP (!) 134/56   Pulse 83   Temp 98 F (36.7 C) (Oral)   Resp 16  Ht 5\' 4"  (1.626 m)   Wt 80.3 kg (177 lb)   SpO2 95%   BMI 30.38 kg/m   Physical Exam  Constitutional: She is oriented to person, place, and time. She appears well-developed.  HENT:  Head: Normocephalic.  Eyes: Conjunctivae and EOM are normal. No scleral icterus.  Neck: Neck supple. No thyromegaly present.  Cardiovascular: Normal rate and regular rhythm.  Exam reveals no gallop and no friction rub.   No murmur heard. Pulmonary/Chest: No stridor. She has wheezes. She has no rales. She exhibits no tenderness.  Abdominal: She exhibits no distension. There is no tenderness. There is no rebound.  Musculoskeletal: Normal range of motion. She exhibits no edema.  Lymphadenopathy:    She has no cervical adenopathy.  Neurological: She is oriented to person, place, and time. She exhibits normal muscle tone. Coordination normal.  Skin: No rash noted. No erythema.  Psychiatric: She has a normal mood and affect. Her behavior is normal.     ED Treatments / Results  Labs (all labs ordered are listed, but only abnormal results are displayed) Labs Reviewed  CBC WITH DIFFERENTIAL/PLATELET - Abnormal; Notable for the following:       Result Value   Monocytes Absolute 1.2 (*)    All other components within normal limits  COMPREHENSIVE METABOLIC PANEL - Abnormal; Notable for the following:    Glucose, Bld 112 (*)    BUN 24 (*)    Creatinine, Ser 1.65 (*)    GFR calc non Af Amer 28 (*)    GFR calc Af Amer 33 (*)    All other components within normal limits    EKG  EKG Interpretation None       Radiology Dg Chest 2 View  Result Date: 09/20/2017 CLINICAL DATA:  Dyspnea, worsened over the past 2 days. EXAM: CHEST  2 VIEW COMPARISON:  07/19/2017 FINDINGS: Marked cardiomegaly, unchanged. Moderate vascular and interstitial  prominence, unchanged or mildly worsened. No pleural effusions. No focal airspace consolidation. Right jugular port appears satisfactorily positioned, terminating in the low SVC. IMPRESSION: Unchanged cardiomegaly. Moderate vascular and interstitial prominence, without alveolar edema or airspace consolidation. Electronically Signed   By: Andreas Newport M.D.   On: 09/20/2017 22:34    Procedures Procedures (including critical care time)  Medications Ordered in ED Medications  methylPREDNISolone sodium succinate (SOLU-MEDROL) 125 mg/2 mL injection 125 mg (125 mg Intravenous Given 09/20/17 2056)  ipratropium-albuterol (DUONEB) 0.5-2.5 (3) MG/3ML nebulizer solution 3 mL (3 mLs Nebulization Given 09/20/17 2048)  albuterol (PROVENTIL) (2.5 MG/3ML) 0.083% nebulizer solution 2.5 mg (2.5 mg Nebulization Given 09/20/17 2049)     Initial Impression / Assessment and Plan / ED Course  I have reviewed the triage vital signs and the nursing notes.  Pertinent labs & imaging results that were available during my care of the patient were reviewed by me and considered in my medical decision making (see chart for details).    Patient will be admitted for COPD exacerbation  Final Clinical Impressions(s) / ED Diagnoses   Final diagnoses:  COPD exacerbation ([place])    New Prescriptions New Prescriptions   No medications on file     Milton Ferguson, MD 09/20/17 2256

## 2017-09-20 NOTE — ED Notes (Signed)
ED Provider at bedside. 

## 2017-09-20 NOTE — ED Triage Notes (Signed)
SOB worse x past 2 days.  Pt on 3l O2 continuously.  Hx of copd

## 2017-09-20 NOTE — H&P (Signed)
History and Physical    Angela Lawson GGY:[id number] DOB: 04-21-1934 DOA: 09/20/2017  PCP: Angela Fire, MD   Patient coming from: Home  Chief Complaint: SOB, cough, wheeze  HPI: Angela Lawson is a 83 y.o. female with medical history significant for hypertension, insulin-dependent diabetes mellitus, stage IV lung cancer, COPD with chronic hypoxic respiratory failure, and chronic diastolic CHF, now presenting to the emergency department with progressive dyspnea, cough, and wheezing. The patient reports that she had been in her usual state until the insidious development of dyspnea, increase in her chronic cough, and wheezing. She has had difficulty catching her breath. She denies fevers or chills, denies chest pain, denies lower extremity edema, and denies orthopnea.  ED Course: Upon arrival to the ED, patient is found to be febrile, saturating adequately on her usual 3 L/m supplemental oxygen, and with vitals otherwise stable. EKG features a normal sinus rhythm and chest x-ray demonstrates pulmonary vascular congestion. Chemistry panel is notable for serum creatinine of 1.65, slightly up from her baseline. CBC is unremarkable. Patient was treated with DuoNeb, continuous albuterol neb, and 125 mg of IV Solu-Medrol in the ED. She continues to be dyspneic at rest and will be admitted to the medical/surgical unit for ongoing evaluation and management of acute exacerbation in COPD.  Review of Systems:  All other systems reviewed and apart from HPI, are negative.  Past Medical History:  Diagnosis Date  . Adenocarcinoma of lung ([place])    Left lung 2009, resected  . Anginal pain ([place])   . Arthritis   . Back pain   . CHF (congestive heart failure) ([place])   . COPD (chronic obstructive pulmonary disease) ([place])   . Diverticulitis   . Dyslipidemia   . Essential hypertension   . H/O ventral hernia   . Non-obstructive CAD    a. 04/2015 NSTEMI/Cath: LAD 10p, LCX 99m, RCA 36m, 20d, EF 35-40 w/ apical  ballooning.  . On home O2    3L N/C  . Osteoporosis   . Osteoporosis 11/04/2015   Managed by Dr. Legrand Rams   . Parkinson's disease ([place])   . Shortness of breath   . Takotsubo cardiomyopathy    a. 04/2015 Echo: EF 45-50%, mid-dist anterior/apical/inferoapical HK w/ hyperdynamic base. Gr 1 DD, mild AI, mild-mod MR, triv TR, PASP 29mmHg;  b. 04/2015 LV gram: Ef 35-40% w/ apical ballooning.  . Type II diabetes mellitus ([place])   . Ventricular bigeminy    a. 04/2015 in setting of NSTEMI/Takotsubo.    Past Surgical History:  Procedure Laterality Date  . ABDOMINAL HYSTERECTOMY    . CARDIAC CATHETERIZATION N/A 04/17/2015   Procedure: Left Heart Cath and Coronary Angiography;  Surgeon: Troy Sine, MD;  Location: [facility];  Service: Cardiovascular;  Laterality: N/A;  . CHOLECYSTECTOMY    . COLONOSCOPY N/A 09/18/2014   Procedure: COLONOSCOPY;  Surgeon: Angela Binder, MD;  Location[facility];  Service: Endoscopy;  Laterality: N/A;  8:30 AM - moved to 10:30 Angela Lawson to notify pt  . ECTOPIC PREGNANCY SURGERY    . INCISIONAL HERNIA REPAIR N/A 08/26/2013   Procedure: HERNIA REPAIR INCISIONAL WITH MESH;  Surgeon: Angela So, MD;  Location: [facility];  Service: General;  Laterality: N/A;  . IR FLUORO GUIDE PORT INSERTION RIGHT  04/30/2017  . IR US GUIDE VASC ACCESS RIGHT  04/30/2017  . LUNG CANCER SURGERY    . VIDEO BRONCHOSCOPY WITH ENDOBRONCHIAL NAVIGATION N/A 04/23/2017   Procedure: VIDEO BRONCHOSCOPY WITH  ENDOBRONCHIAL NAVIGATION;  Surgeon: Angela Nakayama, MD;  Location: [facility];  Service: Thoracic;  Laterality: N/A;  . VIDEO BRONCHOSCOPY WITH ENDOBRONCHIAL ULTRASOUND N/A 04/23/2017   Procedure: VIDEO BRONCHOSCOPY WITH ENDOBRONCHIAL ULTRASOUND;  Surgeon: Angela Nakayama, MD;  Location: [facility];  Service: Thoracic;  Laterality: N/A;     reports that she quit smoking about 11 years ago. Her smoking use included Cigarettes. She quit after 20.00 years of use. She has never used  smokeless tobacco. She reports that she does not drink alcohol or use drugs.  Allergies  Allergen Reactions  . Lisinopril Cough    Family History  Problem Relation Age of Onset  . Diabetes Mother   . Hypertension Mother   . Diabetes Father   . Asthma Unknown   . Cancer Unknown   . Heart attack Sister        X2  . Colon cancer Neg Hx      Prior to Admission medications   Medication Sig Start Date End Date Taking? Authorizing Provider  albuterol (PROAIR HFA) 108 (90 BASE) MCG/ACT inhaler Inhale 2 puffs into the lungs every 4 (four) hours as needed for wheezing or shortness of breath.   Yes [provider]  alendronate (FOSAMAX) 70 MG tablet Take 70 mg by mouth every Friday.    Yes [provider]  aspirin EC 81 MG tablet Take 81 mg by mouth daily.   Yes [provider]  Atezolizumab (TECENTRIQ IV) Inject into the vein. Every 3 weeks   Yes [provider]  atorvastatin (LIPITOR) 40 MG tablet Take 40 mg by mouth at bedtime.    Yes [provider]  buPROPion (WELLBUTRIN SR) 150 MG 12 hr tablet Take 150 mg by mouth 2 (two) times daily.   Yes [provider]  cholecalciferol (VITAMIN D) 1000 units tablet Take 1,000 Units by mouth daily.   Yes [provider]  clotrimazole-betamethasone (LOTRISONE) cream Apply 1 application topically 2 (two) times daily. 09/10/17  Yes Angela Bouche, NP  furosemide (LASIX) 40 MG tablet Take 40 mg by mouth daily. Pt is able to take one additional tablet daily for weight increases of 3lbs in a day or 5lbs in a week.   Yes [provider]  gabapentin (NEURONTIN) 300 MG capsule Take 600 mg by mouth at bedtime.    Yes [provider]  Garlic 841 MG TABS Take 300 mg by mouth daily.   Yes [provider]  ipratropium-albuterol (DUONEB) 0.5-2.5 (3) MG/3ML SOLN Inhale 3 mLs into the lungs every 6 (six) hours as needed (for wheezing/shortness of breath).    Yes [provider]  KLOR-CON 10 10 MEQ tablet TAKE 1 TABLET (10 MEQ TOTAL) BY MOUTH DAILY. *FURTHER REFILLS NEED TO BE AUTHORIZED BY PCP* 08/11/17  Yes Lorretta Harp, MD  LANTUS SOLOSTAR 100 UNIT/ML Solostar Pen Inject 15 Units into the skin at bedtime.    Yes [provider]  lidocaine-prilocaine (EMLA) cream Apply to affected area once 05/19/17  Yes Brunetta Genera, MD  linagliptin (TRADJENTA) 5 MG TABS tablet Take 5 mg by mouth daily.   Yes [provider]  loratadine (CLARITIN) 10 MG tablet Take 10 mg by mouth daily.   Yes [provider]  losartan (COZAAR) 50 MG tablet Take 50 mg by mouth daily.   Yes [provider]  meclizine (ANTIVERT) 25 MG tablet Take 25 mg by mouth daily.    Yes [provider]  metoprolol  succinate (TOPROL-XL) 25 MG 24 hr tablet Take 1 tablet (25 mg total) by mouth daily. 06/26/15  Yes Jettie Booze, MD  nitroGLYCERIN (NITROSTAT) 0.4 MG SL tablet Place 0.4 mg under the tongue every 5 (five) minutes as needed for chest pain.   Yes [provider]  Omega-3 Fatty Acids (FISH OIL PO) Take 1 capsule by mouth daily.   Yes [provider]  omeprazole (PRILOSEC) 40 MG capsule Take 40 mg by mouth daily.   Yes [provider]  ondansetron (ZOFRAN) 8 MG tablet Take 1 tablet (8 mg total) by mouth 2 (two) times daily as needed (Nausea or vomiting). 05/19/17  Yes Brunetta Genera, MD  OXYGEN Inhale 3 L into the lungs continuous.   Yes [provider]  polyethylene glycol powder (GLYCOLAX/MIRALAX) powder Take 1 capful daily as needed. Patient taking differently: Take 17 g by mouth daily as needed for mild constipation or moderate constipation. Take 1 capful daily as needed. 06/01/17  Yes Twana First, MD  prochlorperazine (COMPAZINE) 10 MG tablet Take 1 tablet (10 mg total) by mouth every 6 (six) hours as needed (Nausea or vomiting). 05/19/17  Yes Brunetta Genera, MD  pyridoxine (B-6) 100 MG  tablet Take 100 mg by mouth daily.    Yes [provider]  rOPINIRole (REQUIP) 0.5 MG tablet Take 0.5 mg by mouth 2 (two) times daily.    Yes [provider]  sodium chloride (OCEAN) 0.65 % SOLN nasal spray Place 1 spray into both nostrils every 3 (three) hours as needed for congestion.    Yes [provider]  traMADol (ULTRAM) 50 MG tablet Take 50 mg by mouth every 12 (twelve) hours as needed for moderate pain.    Yes [provider]  zolpidem (AMBIEN) 5 MG tablet Take 5 mg by mouth at bedtime as needed for sleep.   Yes [provider]    Physical Exam: Vitals:   09/20/17 2049 09/20/17 2100 09/20/17 2130 09/20/17 2230  BP:  (!) 145/56 (!) 147/68 (!) 134/56  Pulse:  78 79 83  Resp:  15 14 16   Temp:      TempSrc:      SpO2: 96% 100% 95% 95%  Weight:      Height:          Constitutional: NAD, calm, appears uncomfortable  Eyes: PERTLA, lids and conjunctivae normal ENMT: Mucous membranes are moist. Posterior pharynx clear of any exudate or lesions.   Neck: normal, supple, no masses, no thyromegaly Respiratory: Diminished bilaterally, prolonged expiratory phase, wheezing. Dyspneic with speech. No accessory muscle use.  Cardiovascular: S1 & S2 heard, regular rate and rhythm. No extremity edema. No significant JVD. Abdomen: No distension, no tenderness, no masses palpated. Bowel sounds normal.  Musculoskeletal: no clubbing / cyanosis. No joint deformity upper and lower extremities.  Skin: no significant rashes, lesions, ulcers. Warm, dry, well-perfused. Neurologic: CN 2-12 grossly intact. Sensation intact. Strength 5/5 in all 4 limbs.  Psychiatric: Alert and oriented x 3. Pleasant and cooperative.     Labs on Admission: I have personally reviewed following labs and imaging studies  CBC:  Recent Labs Lab 09/20/17 2054  WBC 9.7  NEUTROABS 6.1  HGB 12.8  HCT 40.4  MCV 94.0  PLT 542   Basic Metabolic Panel:  Recent Labs Lab  09/20/17 2054  NA 140  K 4.6  CL 101  CO2 28  GLUCOSE 112*  BUN 24*  CREATININE 1.65*  CALCIUM 9.9   GFR:  Estimated Creatinine Clearance: 27.4 mL/min (A) (by C-G formula based on SCr of 1.65 mg/dL (H)). Liver Function Tests:  Recent Labs Lab 09/20/17 2054  AST 25  ALT 20  ALKPHOS 58  BILITOT 0.6  PROT 7.6  ALBUMIN 4.1   No results for input(s): LIPASE, AMYLASE in the last 168 hours. No results for input(s): AMMONIA in the last 168 hours. Coagulation Profile: No results for input(s): INR, PROTIME in the last 168 hours. Cardiac Enzymes: No results for input(s): CKTOTAL, CKMB, CKMBINDEX, TROPONINI in the last 168 hours. BNP (last 3 results) No results for input(s): PROBNP in the last 8760 hours. HbA1C: No results for input(s): HGBA1C in the last 72 hours. CBG: No results for input(s): GLUCAP in the last 168 hours. Lipid Profile: No results for input(s): CHOL, HDL, LDLCALC, TRIG, CHOLHDL, LDLDIRECT in the last 72 hours. Thyroid Function Tests: No results for input(s): TSH, T4TOTAL, FREET4, T3FREE, THYROIDAB in the last 72 hours. Anemia Panel: No results for input(s): VITAMINB12, FOLATE, FERRITIN, TIBC, IRON, RETICCTPCT in the last 72 hours. Urine analysis:    Component Value Date/Time   COLORURINE YELLOW 04/26/2017 2112   APPEARANCEUR CLEAR 04/26/2017 2112   LABSPEC 1.008 04/26/2017 2112   PHURINE 6.0 04/26/2017 2112   GLUCOSEU NEGATIVE 04/26/2017 2112   HGBUR NEGATIVE 04/26/2017 2112   BILIRUBINUR NEGATIVE 04/26/2017 2112   [place] NEGATIVE 04/26/2017 2112   PROTEINUR NEGATIVE 04/26/2017 2112   UROBILINOGEN 0.2 04/19/2012 1411   NITRITE NEGATIVE 04/26/2017 2112   LEUKOCYTESUR TRACE (A) 04/26/2017 2112   Sepsis Labs: @LABRCNTIP (procalcitonin:4,lacticidven:4) )No results found for this or any previous visit (from the past 240 hour(s)).   Radiological Exams on Admission: Dg Chest 2 View  Result Date: 09/20/2017 CLINICAL DATA:  Dyspnea, worsened over the  past 2 days. EXAM: CHEST  2 VIEW COMPARISON:  07/19/2017 FINDINGS: Marked cardiomegaly, unchanged. Moderate vascular and interstitial prominence, unchanged or mildly worsened. No pleural effusions. No focal airspace consolidation. Right jugular port appears satisfactorily positioned, terminating in the low SVC. IMPRESSION: Unchanged cardiomegaly. Moderate vascular and interstitial prominence, without alveolar edema or airspace consolidation. Electronically Signed   By: Andreas Newport M.D.   On: 09/20/2017 22:34    EKG: Independently reviewed. Normal sinus rhythm.   Assessment/Plan  1. COPD with acute exacerbation, chronic hypoxic respiratory failure  - Pt presents progressively worsening dyspnea over the past couple days, associated with increased cough and wheezing - Treated in ED with DuoNeb, nebulized albuterol, and 125 mg IV Solu-Medrol  - Plan to check sputum culture, continue systemic steroid, continue nebs, continue supplemental O2   2. Chronic diastolic CHF  - Appears euvolemic on admission, no edema, no JVD, no crackles  - Continue Lasix 40 mg qD, losartan, metoprolol  - Follow daily wts and I/O's    3. Stage IV lung cancer  - Follows with oncology and managed with chemotherapy  - CXR stable on admission  - Continue oncology follow-up    4. Hypertension  - BP at goal  - Continue Toprol and losartan   5. Insulin-dependent DM  - A1c was 7.0% in May 2018  - Managed at home with Lantus 15 units qHS and Tradjenta  - Check CBG with meals and qHS   - Continue Lantus 15 units qHS with a low-intensity SSI    6. CKD stage III  - SCr is 1.65 on admission, slightly up from apparent baseline  - Renally-dose medications as needed, repeat chem panel in am    DVT prophylaxis: Lovenox  Code Status: Full  Family Communication: Daughter updated at bedside Disposition Plan: Admit to med-surg Consults called: None Admission status: Inpatient    Vianne Bulls, MD [facility] Pager [phone]  If 7PM-7AM, please contact night-coverage [email or web address] Password [email or web address]  09/20/2017, 11:04 PM

## 2017-09-20 NOTE — ED Notes (Signed)
Pt given cup of ice. 

## 2017-09-21 LAB — CBC
HCT: 42.7 % (ref 36.0–46.0)
Hemoglobin: 13 g/dL (ref 12.0–15.0)
MCH: 28.9 pg (ref 26.0–34.0)
MCHC: 30.4 g/dL (ref 30.0–36.0)
MCV: 94.9 fL (ref 78.0–100.0)
Platelets: 226 10*3/uL (ref 150–400)
RBC: 4.5 MIL/uL (ref 3.87–5.11)
RDW: 15.2 % (ref 11.5–15.5)
WBC: 10.5 10*3/uL (ref 4.0–10.5)

## 2017-09-21 LAB — GLUCOSE, CAPILLARY
Glucose-Capillary: 129 mg/dL — ABNORMAL HIGH (ref 65–99)
Glucose-Capillary: 163 mg/dL — ABNORMAL HIGH (ref 65–99)
Glucose-Capillary: 188 mg/dL — ABNORMAL HIGH (ref 65–99)
Glucose-Capillary: 208 mg/dL — ABNORMAL HIGH (ref 65–99)
Glucose-Capillary: 222 mg/dL — ABNORMAL HIGH (ref 65–99)

## 2017-09-21 LAB — BASIC METABOLIC PANEL
Anion gap: 14 (ref 5–15)
BUN: 27 mg/dL — ABNORMAL HIGH (ref 6–20)
CO2: 23 mmol/L (ref 22–32)
Calcium: 9.6 mg/dL (ref 8.9–10.3)
Chloride: 98 mmol/L — ABNORMAL LOW (ref 101–111)
Creatinine, Ser: 1.8 mg/dL — ABNORMAL HIGH (ref 0.44–1.00)
GFR calc Af Amer: 29 mL/min — ABNORMAL LOW (ref >60)
GFR calc non Af Amer: 25 mL/min — ABNORMAL LOW (ref >60)
Glucose, Bld: 237 mg/dL — ABNORMAL HIGH (ref 65–99)
Potassium: 5 mmol/L (ref 3.5–5.1)
Sodium: 135 mmol/L (ref 135–145)

## 2017-09-21 MED ORDER — POLYETHYLENE GLYCOL 3350 17 G PO PACK: 17.0000 g | PACK | Freq: Every day | ORAL | Status: DC | PRN

## 2017-09-21 MED ORDER — LEVOFLOXACIN IN D5W 750 MG/150ML IV SOLN
750.0000 mg | Freq: Once | INTRAVENOUS | Status: AC
  Administered 2017-09-21: 750 mg via INTRAVENOUS
  Filled 2017-09-21: qty 150

## 2017-09-21 MED ORDER — LEVOFLOXACIN IN D5W 500 MG/100ML IV SOLN
500.0000 mg | INTRAVENOUS | Status: DC
  Administered 2017-09-23: 500 mg via INTRAVENOUS
  Filled 2017-09-21: qty 100

## 2017-09-21 MED ORDER — IPRATROPIUM-ALBUTEROL 0.5-2.5 (3) MG/3ML IN SOLN
3.0000 mL | Freq: Three times a day (TID) | RESPIRATORY_TRACT | Status: DC
  Administered 2017-09-21 – 2017-09-23 (×6): 3 mL via RESPIRATORY_TRACT
  Filled 2017-09-21 (×6): qty 3

## 2017-09-21 NOTE — Progress Notes (Signed)
Vitals Response to Exersion (during physical therapy evaluation)   Resting, Supine  09/21/17 1115  Therapy Vitals  Pulse Rate 83  Oxygen Therapy  SpO2   O2 Device Nasal Cannula      S/p AMB with PT seated 09/21/17 1125   Therapy Vitals  Pulse Rate (!) 101  Oxygen Therapy  SpO2 (!) 82 %  O2 Device Nasal Cannula  O2 Flow Rate (L/min) 2.5 L/min   60 seconds recovery seated 09/21/17 1126   Therapy Vitals  Pulse Rate (!) 100  Oxygen Therapy  SpO2 (!) 92 %  O2 Device Nasal Cannula  O2 Flow Rate (L/min) 4 L/min    12:38 PM, 09/21/17 Etta Grandchild, PT, DPT Physical Therapist - [place] [phone] (Office)

## 2017-09-21 NOTE — Care Management Note (Signed)
Case Management Note  Patient Details  Name: MARQUERITE FORSMAN MRN: [id number] Date of Birth: 01/31/1934  Subjective/Objective:                 Admitted with COPD exacerbation. Pt is from home, ind, has dtr for support. She has PCP, transportation, insurance with drug coverage. She is compliant with diet and medications. She has home O2 and neb machine pta. PT recommends HH PT and pt declines HH a this time. Communicates no needs or concerns about DC plan.    Action/Plan: DC home with self care. CM will follow to DC.   Expected Discharge Date:  09/23/17               Expected Discharge Plan:  Home/Self Care  In-House Referral:  NA  Discharge planning Services  CM Consult  Post Acute Care Choice:  NA Choice offered to:  NA  Status of Service:  In process, will continue to follow  Sherald Barge, RN 09/21/2017, 2:05 PM

## 2017-09-21 NOTE — Progress Notes (Signed)
Pharmacy Antibiotic Note  Angela Lawson is a 83 y.o. female admitted on 09/20/2017 with pneumonia.  Pharmacy has been consulted for [facility] dosing.  Plan: Levaquin 750mg  x 1 then 500mg  IV q48hrs (renally adjusted) Monitor labs, progress, c/s  Height: 5\' 4"  (162.6 cm) Weight: 173 lb (78.5 kg) IBW/kg (Calculated) : 54.7  Temp (24hrs), Avg:97.9 F (36.6 C), Min:97.8 F (36.6 C), Max:98 F (36.7 C)   Recent Labs Lab 09/20/17 2054 09/21/17 0923  WBC 9.7 10.5  CREATININE 1.65* 1.80*    Estimated Creatinine Clearance: 24.8 mL/min (A) (by C-G formula based on SCr of 1.8 mg/dL (H)).    Allergies  Allergen Reactions  . Lisinopril Cough   Antimicrobials this admission: Levaquin 10/15 >>   Dose adjustments this admission:  Microbiology results:  Sputum: pending   Thank you for allowing pharmacy to be a part of this patient's care.  Hart Robinsons A 09/21/2017 12:49 PM

## 2017-09-21 NOTE — Progress Notes (Signed)
Initial Nutrition Assessment  DOCUMENTATION CODES:     INTERVENTION:  Snack q hs as needed   NUTRITION DIAGNOSIS:   Increased nutrient needs (COPD exacerbation, Stage IV lung cancer ) related to cancer and cancer related treatments as evidenced by estimated needs.   GOAL:   Patient will meet greater than or equal to 90% of their needs   MONITOR:    Po intake, labs and wt trends   REASON FOR ASSESSMENT:   Consult COPD Protocol  ASSESSMENT:  Angela Lawson is an overweight 83 yo female with hx of CHF, CKD-3, DM-2 and lung cancer.  Her weight is down 4 lbs (2%) in 2 weeks and is not clinically significant. She is at risk for wt loss given her multiple comorbid conditions. However her appetite and intake remain good (75-100%) of meals. She continue to prepare meals at times and her daughter helps with the grocery shopping. Nutrition-Focused physical exam within normal limits.   Recent Labs Lab 09/20/17 2054 09/21/17 0923  NA 140 135  K 4.6 5.0  CL 101 98*  CO2 28 23  BUN 24* 27*  CREATININE 1.65* 1.80*  CALCIUM 9.9 9.6  GLUCOSE 112* 237*   Labs and meds reviewed  Diet Order:  Diet Heart Room service appropriate? Yes; Fluid consistency: Thin  Skin:  Reviewed, no issues  Last BM:  10/14   Height:   Ht Readings from Last 1 Encounters:  09/21/17 5\' 4"  (1.626 m)    Weight:   Wt Readings from Last 1 Encounters:  09/21/17 173 lb (78.5 kg)    Ideal Body Weight:  55 kg  BMI:  Body mass index is 29.7 kg/m.  Estimated Nutritional Needs:   Kcal:  5056-9794  Protein:  62-69 gr   Fluid:  1500 ml daily  EDUCATION NEEDS:   No education needs identified at this time  Colman Cater Angela,RD,CSG,LDN Office: #[phone] Pager: [phone]

## 2017-09-21 NOTE — Progress Notes (Signed)
OT Cancellation Note  Patient Details Name: Angela Lawson MRN: [id number] DOB: May 29, 1934   Cancelled Treatment:    Reason Eval/Treat Not Completed: OT screened, no needs identified, will sign off At this time, patient is living with daughter and receiving mild assistance as needed with Basic ADL tasks. Patient functioning primarily at supervision to Modified independent level. At this time, no follow up OT services needed. Patient may require a referral to OT in the future due to her cancer diagnosis and response to treatment.    Ailene Ravel, OTR/L,CBIS  [phone]  09/21/2017, 12:52 PM

## 2017-09-21 NOTE — Clinical Social Work Note (Signed)
LCSW provided eduction on COPD gold protocol with patient. Patient participated in COPD gold protocol and scored a three on the PHQ-9 and a one on the GAD 7. These results indicated that anxiety/depression and not exacerbating patient's COPD.    LCSW singing off.      Vernice Mannina, Clydene Pugh, LCSW

## 2017-09-21 NOTE — Evaluation (Signed)
Physical Therapy Evaluation Patient Details Name: Angela Lawson MRN: [id number] DOB: 1934-08-31 Today's Date: 09/21/2017   History of Present Illness  Lizmarie Boyum is an 83yo black female who comes to [facility] on 10/14 d/t increased SOB, wheezing, and coughing. PMH: HTN, IDDM, stg IV Lung CA (undergoing treatments currently), COPD c CRF on 3LPM at baseline, Dia CHF, Parkinson's Disease, Osteoporosis, ventricular bigeminy. At baseline pt live with daughter, mostly household AMB, accesses the community for medical appointments via [place]. Daughter assists with IADL, transport, supervision for bathing.   Clinical Impression  Pt admitted with above diagnosis. Pt currently with functional limitations due to the deficits listed below (see "PT Problem List"). Pt progressively weaker over the past few months. Worsening functional mobility multifactorial including CA, CA treatments, complex lung diseases, and parkinsonism. Significant desaturation to 82% with simple AMB from bed to chair this session. Empirically, the patient demonstrates increased risk of recurrent falls AEB gait speed <0.47m/s, forward reach <5", and falls history at home. Pt will benefit from skilled PT intervention to increase independence and safety with basic mobility in preparation for discharge to the venue listed below.       Follow Up Recommendations Home health PT    Equipment Recommendations  None recommended by PT    Recommendations for Other Services       Precautions / Restrictions Precautions Precaution Comments: no falls score available Restrictions Weight Bearing Restrictions: No      Mobility  Bed Mobility Overal bed mobility: Modified Independent                Transfers Overall transfer level: Needs assistance Equipment used: Standard walker Transfers: Sit to/from Stand Sit to Stand: Supervision         General transfer comment: moderate effort and addition time/attempts required.   Ambulation/Gait    Ambulation Distance (Feet): 12 Feet (EOB to gerichair) Assistive device: Standard walker   Gait velocity: <0.47m/s  Gait velocity interpretation: <1.8 ft/sec, indicative of risk for recurrent falls General Gait Details: 3-point gait pattern with SW; significant labored breathing, drop in SpO2 (se detail below)   Resting, Supine  09/21/17 1115  Therapy Vitals  Pulse Rate 83  Oxygen Therapy  SpO2   O2 Device Nasal Cannula      S/p AMB with PT seated 09/21/17 1125   Therapy Vitals  Pulse Rate (!) 101  Oxygen Therapy  SpO2 (!) 82 %  O2 Device Nasal Cannula  O2 Flow Rate (L/min) 2.5 L/min   60 seconds recovery seated 09/21/17 1126   Therapy Vitals  Pulse Rate (!) 100  Oxygen Therapy  SpO2 (!) 92 %  O2 Device Nasal Cannula  O2 Flow Rate (L/min) 4 L/min   Stairs             Wheelchair Mobility    Modified Rankin (Stroke Patients Only)       Balance Overall balance assessment: No apparent balance deficits (not formally assessed);Needs assistance;History of Falls (1 fall in last 3 months)                                           Pertinent Vitals/Pain Pain Assessment: No/denies pain    Home Living Family/patient expects to be discharged to:: Private residence Living Arrangements: Children (daughter, Mickel Baas ) Available Help at Discharge: Family Type of Home: House Home Access: Ramped entrance     Home  Layout: One level Home Equipment: Walker - 4 wheels;Cane - single point;Wheelchair - manual      Prior Function Level of Independence: Needs assistance;Independent with assistive device(s)         Comments: independent dressing and toiletting; spervision and set-up for bathing; total assist for transport, and WC propulsion outside of home.      Hand Dominance   Dominant Hand: Right    Extremity/Trunk Assessment                Communication   Communication: No difficulties  Cognition Arousal/Alertness:  Awake/alert Behavior During Therapy: WFL for tasks assessed/performed Overall Cognitive Status: Within Functional Limits for tasks assessed                                        General Comments      Exercises     Assessment/Plan    PT Assessment Patient needs continued PT services  PT Problem List Decreased balance;Decreased mobility;Cardiopulmonary status limiting activity;Decreased knowledge of precautions;Decreased activity tolerance;Decreased strength       PT Treatment Interventions Functional mobility training;Therapeutic activities;Therapeutic exercise;Balance training;Patient/family education    PT Goals (Current goals can be found in the Care Plan section)  Acute Rehab PT Goals Patient Stated Goal: decrease breathlessness PT Goal Formulation: With patient Time For Goal Achievement: 10/05/17 Potential to Achieve Goals: Good    Frequency Min 2X/week   Barriers to discharge Decreased caregiver support pt home alone while daughter works first shift    Co-evaluation               AM-PAC PT "6 Clicks" Daily Activity  Outcome Measure Difficulty turning over in bed (including adjusting bedclothes, sheets and blankets)?: A Little Difficulty moving from lying on back to sitting on the side of the bed? : A Lot Difficulty sitting down on and standing up from a chair with arms (e.g., wheelchair, bedside commode, etc,.)?: A Lot Help needed moving to and from a bed to chair (including a wheelchair)?: A Little Help needed walking in hospital room?: A Lot Help needed climbing 3-5 steps with a railing? : Total 6 Click Score: 13    End of Session Equipment Utilized During Treatment: Gait belt;Oxygen Activity Tolerance: Patient limited by fatigue;Other (comment);Treatment limited secondary to medical complications (Comment) (limited by breathlessness and desaturation) Patient left: in chair;with call bell/phone within reach Nurse Communication: Other  (comment) PT Visit Diagnosis: Unsteadiness on feet (R26.81);Difficulty in walking, not elsewhere classified (R26.2);Other abnormalities of gait and mobility (R26.89)    Time: 2202-5427 PT Time Calculation (min) (ACUTE ONLY): 27 min   Charges:   PT Evaluation $PT Eval Moderate Complexity: 1 Mod     PT G Codes:       12:47 PM, Oct 13, 2017 Etta Grandchild, PT, DPT Physical Therapist - [place] [phone] (Office)   Buccola,Allan C 10/13/17, 12:45 PM

## 2017-09-21 NOTE — Progress Notes (Signed)
PROGRESS NOTE                                                                                                                                                                                                             Patient Demographics:    Angela Lawson, is a 83 y.o. female, DOB - [birth year], LEX:[id number]  Admit date - 09/20/2017   Admitting Physician Vianne Bulls, MD  Outpatient Primary MD for the patient is Rosita Fire, MD  LOS - 1  Outpatient Specialists: [facility]  Chief Complaint  Patient presents with  . Shortness of Breath       Brief Narrative  83 year old female with history of insulin-dependent diabetes mellitus, stage IV lung cancer on chemotherapy, COPD with chronic hypoxic respiratory failure (on 3-02 at home), chronic diastolic CHF and hypertension presented with progressive dyspnea with cough vertigo of clear sputum and wheezing. For last few days. In the ED her sats were stable on 3 L via nasal cannula, afebrile. Chest x-ray showed pulmonary vascular congestion. CBC was normal and chemistry showed slightly worsened renal function from her baseline. Patient received DuoNeb, IV Solu Medrol continue to be dyspneic and was admitted to medical floor for COPD exacerbation.     Subjective:   Breathing slightly improved since admission. Still gets dyspneic on minimal exertion.   Assessment  & Plan :    Principal Problem:   COPD with acute exacerbation (HCC) Chronic hypoxic respiratory failure Continue IV Solu-Medrol, scheduled DuoNeb's, antitussives and supplemental oxygen. Chest x-ray shows possible right-sided consolidation. Will cover with empiric Levaquin for possible pneumonia.  Active Problems:   Adenocarcinoma of lung, stage IV ([place]) Follows with oncology and getting chemotherapy as outpatient. Next appointment in 1 week.    CKD (chronic kidney disease) stage 3, GFR 30-59 ml/min  (HCC) mildly worsened renal function from baseline creatinine of 1.5. Monitorwhile her Lasix and ARB.    Essential hypertension Stable. Resume home medications    Type II diabetes mellitus ([place]) Continue home dose Lantus and sliding scale coverage.     Chronic diastolic CHF (congestive heart failure) (HCC) Appears euvolemic. Continue daily Lasix, losartan and metoprolol.         Code Status : full code  Family Communication  : none at bedside  Disposition Plan  : home once improved, possibly in the  next 48 hours  Barriers For Discharge : active symptoms  Consults  :  none  Procedures  :none  DVT Prophylaxis  :  Lovenox -  Lab Results  Component Value Date   PLT 226 09/21/2017    Antibiotics  :   Anti-infectives    None        Objective:   Vitals:   09/20/17 2343 09/21/17 0005 09/21/17 0500 09/21/17 0822  BP: (!) 147/67 (!) 162/62    Pulse: 81 88    Resp: 16 18    Temp:  97.8 F (36.6 C)    TempSrc:  Oral    SpO2: 95% 98%  95%  Weight:  78.9 kg (173 lb 14.4 oz) 78.5 kg (173 lb)   Height:  5\' 4"  (1.626 m)      Wt Readings from Last 3 Encounters:  09/21/17 78.5 kg (173 lb)  09/10/17 80.5 kg (177 lb 6.4 oz)  09/10/17 80.6 kg (177 lb 9.6 oz)     Intake/Output Summary (Last 24 hours) at 09/21/17 1109 Last data filed at 09/21/17 0900  Gross per 24 hour  Intake              243 ml  Output                0 ml  Net              243 ml     Physical Exam  Gen: not in distress HEENT: no pallor, moist mucosa, supple neck Chest: scattered wheezing bilaterally, diminished breath sounds over right lung base CVS: N S1&S2, no murmurs, rubs or gallop GI: soft, NT, ND, BS+ Musculoskeletal: warm, no edema     Data Review:    CBC  Recent Labs Lab 09/20/17 2054 09/21/17 0923  WBC 9.7 10.5  HGB 12.8 13.0  HCT 40.4 42.7  PLT 203 226  MCV 94.0 94.9  MCH 29.8 28.9  MCHC 31.7 30.4  RDW 15.3 15.2  LYMPHSABS 1.8  --   MONOABS 1.2*  --     EOSABS 0.5  --   BASOSABS 0.0  --     Chemistries   Recent Labs Lab 09/20/17 2054 09/21/17 0923  NA 140 135  K 4.6 5.0  CL 101 98*  CO2 28 23  GLUCOSE 112* 237*  BUN 24* 27*  CREATININE 1.65* 1.80*  CALCIUM 9.9 9.6  AST 25  --   ALT 20  --   ALKPHOS 58  --   BILITOT 0.6  --    ------------------------------------------------------------------------------------------------------------------ No results for input(s): CHOL, HDL, LDLCALC, TRIG, CHOLHDL, LDLDIRECT in the last 72 hours.  Lab Results  Component Value Date   HGBA1C 7.0 (H) 04/21/2017   ------------------------------------------------------------------------------------------------------------------ No results for input(s): TSH, T4TOTAL, T3FREE, THYROIDAB in the last 72 hours.  Invalid input(s): FREET3 ------------------------------------------------------------------------------------------------------------------ No results for input(s): VITAMINB12, FOLATE, FERRITIN, TIBC, IRON, RETICCTPCT in the last 72 hours.  Coagulation profile No results for input(s): INR, PROTIME in the last 168 hours.  No results for input(s): DDIMER in the last 72 hours.  Cardiac Enzymes No results for input(s): CKMB, TROPONINI, MYOGLOBIN in the last 168 hours.  Invalid input(s): CK ------------------------------------------------------------------------------------------------------------------    Component Value Date/Time   BNP 43.0 06/22/2017 2331    Inpatient Medications  Scheduled Meds: . aspirin EC  81 mg Oral Daily  . atorvastatin  40 mg Oral q1800  . buPROPion  150 mg Oral BID  . cholecalciferol  1,000 Units Oral Daily  .  furosemide  40 mg Oral Daily  . gabapentin  600 mg Oral QHS  . heparin  5,000 Units Subcutaneous Q8H  . insulin aspart  0-5 Units Subcutaneous QHS  . insulin aspart  0-9 Units Subcutaneous TID WC  . insulin glargine  15 Units Subcutaneous QHS  . ipratropium-albuterol  3 mL Inhalation QID  .  loratadine  10 mg Oral Daily  . losartan  50 mg Oral Daily  . meclizine  25 mg Oral Daily  . methylPREDNISolone (SOLU-MEDROL) injection  40 mg Intravenous Q8H  . metoprolol succinate  25 mg Oral Daily  . omega-3 acid ethyl esters  1 g Oral Daily  . pantoprazole  40 mg Oral Daily  . pyridOXINE  100 mg Oral Daily  . rOPINIRole  0.5 mg Oral BID  . sodium chloride flush  3 mL Intravenous Q12H   Continuous Infusions: . sodium chloride     PRN Meds:.sodium chloride, acetaminophen **OR** acetaminophen, albuterol, HYDROcodone-acetaminophen, ondansetron **OR** ondansetron (ZOFRAN) IV, polyethylene glycol, sodium chloride, sodium chloride flush, traMADol, zolpidem  Micro Results No results found for this or any previous visit (from the past 240 hour(s)).  Radiology Reports Dg Chest 2 View  Result Date: 09/20/2017 CLINICAL DATA:  Dyspnea, worsened over the past 2 days. EXAM: CHEST  2 VIEW COMPARISON:  07/19/2017 FINDINGS: Marked cardiomegaly, unchanged. Moderate vascular and interstitial prominence, unchanged or mildly worsened. No pleural effusions. No focal airspace consolidation. Right jugular port appears satisfactorily positioned, terminating in the low SVC. IMPRESSION: Unchanged cardiomegaly. Moderate vascular and interstitial prominence, without alveolar edema or airspace consolidation. Electronically Signed   By: Andreas Newport M.D.   On: 09/20/2017 22:34    Time Spent in minutes  25   Louellen Molder M.D on 09/21/2017 at 11:09 AM  Between 7am to 7pm - Pager - [phone]  After 7pm go to [email or web address] - password [facility]  [facility] -  Office  [phone]

## 2017-09-22 DIAGNOSIS — J181 Lobar pneumonia, unspecified organism: Secondary | ICD-10-CM

## 2017-09-22 LAB — BASIC METABOLIC PANEL
Anion gap: 12 (ref 5–15)
BUN: 39 mg/dL — ABNORMAL HIGH (ref 6–20)
CO2: 26 mmol/L (ref 22–32)
Calcium: 9.5 mg/dL (ref 8.9–10.3)
Chloride: 97 mmol/L — ABNORMAL LOW (ref 101–111)
Creatinine, Ser: 1.89 mg/dL — ABNORMAL HIGH (ref 0.44–1.00)
GFR calc Af Amer: 28 mL/min — ABNORMAL LOW (ref >60)
GFR calc non Af Amer: 24 mL/min — ABNORMAL LOW (ref >60)
Glucose, Bld: 213 mg/dL — ABNORMAL HIGH (ref 65–99)
Potassium: 4.8 mmol/L (ref 3.5–5.1)
Sodium: 135 mmol/L (ref 135–145)

## 2017-09-22 LAB — GLUCOSE, CAPILLARY
Glucose-Capillary: 211 mg/dL — ABNORMAL HIGH (ref 65–99)
Glucose-Capillary: 251 mg/dL — ABNORMAL HIGH (ref 65–99)
Glucose-Capillary: 180 mg/dL — ABNORMAL HIGH (ref 65–99)
Glucose-Capillary: 208 mg/dL — ABNORMAL HIGH (ref 65–99)

## 2017-09-22 MED ORDER — INSULIN GLARGINE 100 UNIT/ML ~~LOC~~ SOLN
18.0000 [IU] | Freq: Every day | SUBCUTANEOUS | Status: DC
  Administered 2017-09-22: 18 [IU] via SUBCUTANEOUS
  Filled 2017-09-22 (×2): qty 0.18

## 2017-09-22 NOTE — Progress Notes (Signed)
Inpatient Diabetes Program Recommendations  AACE/ADA: New Consensus Statement on Inpatient Glycemic Control (2015)  Target Ranges:  Prepandial:   less than 140 mg/dL      Peak postprandial:   less than 180 mg/dL (1-2 hours)      Critically ill patients:  140 - 180 mg/dL   Results for [name], [name] (MRN [id number]) as of 09/22/2017 08:31  Ref. Range 09/21/2017 07:27 09/21/2017 11:21 09/21/2017 16:09 09/21/2017 21:45 09/22/2017 07:24  Glucose-Capillary Latest Ref Range: 65 - 99 mg/dL 163 (H) 222 (H) 129 (H) 188 (H) 211 (H)   Review of Glycemic Control  Diabetes history: DM2 Outpatient Diabetes medications: Tradjenta 5 mg daily Current orders for Inpatient glycemic control: Lantus 15 units QHS, Novolog 0-9 units TID with meals, Novolog 0-5 units QHS  Inpatient Diabetes Program Recommendations:  Insulin - Basal: If steroids are continued, please consider increasing Lantus to 17 units QHS.  Thanks, Barnie Alderman, RN, MSN, CDE Diabetes Coordinator Inpatient Diabetes Program [phone] (Team Pager from 8am to 5pm)

## 2017-09-22 NOTE — Progress Notes (Signed)
Physical Therapy Treatment Patient Details Name: Angela Lawson MRN: [id number] DOB: 1934/01/11 Today's Date: 09/22/2017    History of Present Illness Angela Lawson is an 83yo black female who comes to [facility] on 10/14 d/t increased SOB, wheezing, and coughing. PMH: HTN, IDDM, stg IV Lung CA (undergoing treatments currently), COPD c CRF on 3LPM at baseline, Dia CHF, Parkinson's Disease, Osteoporosis, ventricular bigeminy. At baseline pt live with daughter, mostly household AMB, accesses the community for medical appointments via [place]. Daughter assists with IADL, transport, supervision for bathing.     PT Comments    Pt eager to get OOB to chair today.  Able to complete all bed mobility and transfers with modified independence.  Ambulated to bathroom without AD and min assist due to assistance with IV and O2 tubing.  Pt able to complete standard height toilet transfer without assistance.  Pt with noted fatigue and drop to 87% O2 at this point.  Able to recover and ambulate additional 15 feet to recliner, again with drop in 02 sats.  Pt requires cues for proper breathing technique to increase her oxygen uptake/concentration as tends to mouth breathe.   Pt with instructions to notify nurse when ready to return to bed.     Follow Up Recommendations  Home health PT     Equipment Recommendations  None recommended by PT    Recommendations for Other Services       Precautions / Restrictions      Mobility  Bed Mobility Overal bed mobility: Modified Independent                Transfers Overall transfer level: Modified independent Equipment used: None Transfers: Sit to/from Stand Sit to Stand: Modified independent (Device/Increase time)         General transfer comment: moderate effort and additional time required  Ambulation/Gait Ambulation/Gait assistance: Supervision Ambulation Distance (Feet): 25 Feet Assistive device: 1 person hand held assist Gait Pattern/deviations: WFL(Within  Functional Limits)     General Gait Details: labored breathing with 02 drop to 87%   Stairs            Wheelchair Mobility    Modified Rankin (Stroke Patients Only)       Balance                                            Cognition Arousal/Alertness: Awake/alert Behavior During Therapy: WFL for tasks assessed/performed Overall Cognitive Status: Within Functional Limits for tasks assessed                                        Exercises      General Comments        Pertinent Vitals/Pain Pain Assessment: No/denies pain    Home Living                      Prior Function            PT Goals (current goals can now be found in the care plan section) Acute Rehab PT Goals Patient Stated Goal: decrease breathlessness PT Goal Formulation: With patient Time For Goal Achievement: 10/05/17 Potential to Achieve Goals: Good Progress towards PT goals: Progressing toward goals    Frequency    Min 2X/week  PT Plan Current plan remains appropriate    Co-evaluation              AM-PAC PT "6 Clicks" Daily Activity  Outcome Measure                   End of Session Equipment Utilized During Treatment: Gait belt;Oxygen Activity Tolerance: Patient limited by fatigue;Other (comment);Treatment limited secondary to medical complications (Comment) (limited by breathlessness and desaturation) Patient left: in chair;with call bell/phone within reach Nurse Communication: Other (comment) PT Visit Diagnosis: Unsteadiness on feet (R26.81);Difficulty in walking, not elsewhere classified (R26.2);Other abnormalities of gait and mobility (R26.89)     Time: 1430-1450 PT Time Calculation (min) (ACUTE ONLY): 20 min  Charges:  $Gait Training: 8-22 mins                    G Codes:       [name], PTA/CLT [place], [name] 09/22/2017, 3:56 PM

## 2017-09-22 NOTE — Progress Notes (Addendum)
PROGRESS NOTE                                                                                                                                                                                                             Patient Demographics:    Angela Lawson, is a 83 y.o. female, DOB - November 18, 1934, IFO:[id number]  Admit date - 09/20/2017   Admitting Physician Vianne Bulls, MD  Outpatient Primary MD for the patient is Rosita Fire, MD  LOS - 2  Outpatient Specialists: [facility]  Chief Complaint  Patient presents with  . Shortness of Breath       Brief Narrative  83 year old female with history of insulin-dependent diabetes mellitus, stage IV lung cancer on chemotherapy, COPD with chronic hypoxic respiratory failure (on 3-02 at home), chronic diastolic CHF and hypertension presented with progressive dyspnea with cough vertigo of clear sputum and wheezing. For last few days. In the ED her sats were stable on 3 L via nasal cannula, afebrile. Chest x-ray showed pulmonary vascular congestion. CBC was normal and chemistry showed slightly worsened renal function from her baseline. Patient received DuoNeb, IV Solu Medrol continue to be dyspneic and was admitted to medical floor for COPD exacerbation.     Subjective:   Dyspnea unchanged from yesterday. ( dyspnic on minimal exertion)   Assessment  & Plan :    Principal Problem:   COPD with acute exacerbation (HCC) Chronic hypoxic respiratory failure Continue IV Solu-Medrol, scheduled DuoNeb's, antitussives and supplemental oxygen. Chest x-ray shows possible right-sided consolidation.  covering with empiric Levaquin for possible pneumonia.  Active Problems:   Adenocarcinoma of lung, stage IV ([facility]) Follows with oncology and getting chemotherapy as outpatient. Next appointment in 1 week.    Acute kidney injury on CKD (chronic kidney disease) stage 3, GFR 30-59 ml/min  (HCC) mildly worsened renal function from baseline creatinine of 1.5. Hold her lasix and ARB.     Essential hypertension Stable. Resume home medications    Type II diabetes mellitus (HCC) CBG elevated on steroids. increase lantus to 18 units daily. Continue home dose Lantus and sliding scale coverage.     Chronic diastolic CHF (congestive heart failure) (HCC) Appears euvolemic. Continue  Metoprolol.  Hold lasix and ARB.         Code Status : full code  Family Communication  :  none at bedside  Disposition Plan  : home possibly in am if improved. Reports no power at home and being unable to use o2// nebs  Barriers For Discharge : active symptoms  Consults  :  none  Procedures  :none  DVT Prophylaxis  :  Lovenox -  Lab Results  Component Value Date   PLT 226 09/21/2017    Antibiotics  :   Anti-infectives    Start     Dose/Rate Route Frequency Ordered Stop   09/23/17 1200  levofloxacin (LEVAQUIN) IVPB 500 mg     500 mg 100 mL/hr over 60 Minutes Intravenous Every 48 hours 09/21/17 1246     09/21/17 1200  levofloxacin (LEVAQUIN) IVPB 750 mg     750 mg 100 mL/hr over 90 Minutes Intravenous  Once 09/21/17 1132 09/21/17 1419        Objective:   Vitals:   09/22/17 0012 09/22/17 0500 09/22/17 0700 09/22/17 0810  BP: (!) 127/49  (!) 128/52   Pulse: 79     Resp: 19     Temp: 98.1 F (36.7 C)  97.8 F (36.6 C)   TempSrc: Oral     SpO2: 97%   96%  Weight:  78.5 kg (173 lb)    Height:        Wt Readings from Last 3 Encounters:  09/22/17 78.5 kg (173 lb)  09/10/17 80.5 kg (177 lb 6.4 oz)  09/10/17 80.6 kg (177 lb 9.6 oz)     Intake/Output Summary (Last 24 hours) at 09/22/17 0913 Last data filed at 09/22/17 0600  Gross per 24 hour  Intake           261.67 ml  Output              950 ml  Net          -688.33 ml     Physical Exam Gen: NAD HEENT: moist mucosa, supple neck Chest: scattered rhonchi with diminished breath sounds b/l  CVS: NS1&S2, no  murmurs GI: Soft, NT, ND  musculoskeletal: warm, no edema       Data Review:    CBC  Recent Labs Lab 09/20/17 2054 09/21/17 0923  WBC 9.7 10.5  HGB 12.8 13.0  HCT 40.4 42.7  PLT 203 226  MCV 94.0 94.9  MCH 29.8 28.9  MCHC 31.7 30.4  RDW 15.3 15.2  LYMPHSABS 1.8  --   MONOABS 1.2*  --   EOSABS 0.5  --   BASOSABS 0.0  --     Chemistries   Recent Labs Lab 09/20/17 2054 09/21/17 0923 09/22/17 0635  NA 140 135 135  K 4.6 5.0 4.8  CL 101 98* 97*  CO2 28 23 26   GLUCOSE 112* 237* 213*  BUN 24* 27* 39*  CREATININE 1.65* 1.80* 1.89*  CALCIUM 9.9 9.6 9.5  AST 25  --   --   ALT 20  --   --   ALKPHOS 58  --   --   BILITOT 0.6  --   --    ------------------------------------------------------------------------------------------------------------------ No results for input(s): CHOL, HDL, LDLCALC, TRIG, CHOLHDL, LDLDIRECT in the last 72 hours.  Lab Results  Component Value Date   HGBA1C 7.0 (H) 04/21/2017   ------------------------------------------------------------------------------------------------------------------ No results for input(s): TSH, T4TOTAL, T3FREE, THYROIDAB in the last 72 hours.  Invalid input(s): FREET3 ------------------------------------------------------------------------------------------------------------------ No results for input(s): VITAMINB12, FOLATE, FERRITIN, TIBC, IRON, RETICCTPCT in the last 72 hours.  Coagulation profile No results for input(s): INR, PROTIME in  the last 168 hours.  No results for input(s): DDIMER in the last 72 hours.  Cardiac Enzymes No results for input(s): CKMB, TROPONINI, MYOGLOBIN in the last 168 hours.  Invalid input(s): CK ------------------------------------------------------------------------------------------------------------------    Component Value Date/Time   BNP 43.0 06/22/2017 2331    Inpatient Medications  Scheduled Meds: . aspirin EC  81 mg Oral Daily  . atorvastatin  40 mg Oral  q1800  . buPROPion  150 mg Oral BID  . cholecalciferol  1,000 Units Oral Daily  . gabapentin  600 mg Oral QHS  . heparin  5,000 Units Subcutaneous Q8H  . insulin aspart  0-5 Units Subcutaneous QHS  . insulin aspart  0-9 Units Subcutaneous TID WC  . insulin glargine  15 Units Subcutaneous QHS  . ipratropium-albuterol  3 mL Inhalation TID  . loratadine  10 mg Oral Daily  . meclizine  25 mg Oral Daily  . methylPREDNISolone (SOLU-MEDROL) injection  40 mg Intravenous Q8H  . metoprolol succinate  25 mg Oral Daily  . omega-3 acid ethyl esters  1 g Oral Daily  . pantoprazole  40 mg Oral Daily  . pyridOXINE  100 mg Oral Daily  . rOPINIRole  0.5 mg Oral BID  . sodium chloride flush  3 mL Intravenous Q12H   Continuous Infusions: . sodium chloride 250 mL (09/21/17 1250)  . [START ON 09/23/2017] levofloxacin (LEVAQUIN) IV     PRN Meds:.sodium chloride, acetaminophen **OR** acetaminophen, albuterol, HYDROcodone-acetaminophen, ondansetron **OR** ondansetron (ZOFRAN) IV, polyethylene glycol, sodium chloride, sodium chloride flush, traMADol, zolpidem  Micro Results No results found for this or any previous visit (from the past 240 hour(s)).  Radiology Reports Dg Chest 2 View  Result Date: 09/20/2017 CLINICAL DATA:  Dyspnea, worsened over the past 2 days. EXAM: CHEST  2 VIEW COMPARISON:  07/19/2017 FINDINGS: Marked cardiomegaly, unchanged. Moderate vascular and interstitial prominence, unchanged or mildly worsened. No pleural effusions. No focal airspace consolidation. Right jugular port appears satisfactorily positioned, terminating in the low SVC. IMPRESSION: Unchanged cardiomegaly. Moderate vascular and interstitial prominence, without alveolar edema or airspace consolidation. Electronically Signed   By: Andreas Newport M.D.   On: 09/20/2017 22:34    Time Spent in minutes  25   Louellen Molder M.D on 09/22/2017 at 9:13 AM  Between 7am to 7pm - Pager - [phone]  After 7pm go to  [email or web address] - password [facility]  [facility] -  Office  [phone]

## 2017-09-23 DIAGNOSIS — I5032 Chronic diastolic (congestive) heart failure: Secondary | ICD-10-CM

## 2017-09-23 DIAGNOSIS — I10 Essential (primary) hypertension: Secondary | ICD-10-CM

## 2017-09-23 DIAGNOSIS — C3491 Malignant neoplasm of unspecified part of right bronchus or lung: Secondary | ICD-10-CM

## 2017-09-23 DIAGNOSIS — E1122 Type 2 diabetes mellitus with diabetic chronic kidney disease: Secondary | ICD-10-CM

## 2017-09-23 DIAGNOSIS — N183 Chronic kidney disease, stage 3 (moderate): Secondary | ICD-10-CM

## 2017-09-23 DIAGNOSIS — Z794 Long term (current) use of insulin: Secondary | ICD-10-CM

## 2017-09-23 DIAGNOSIS — J441 Chronic obstructive pulmonary disease with (acute) exacerbation: Principal | ICD-10-CM

## 2017-09-23 LAB — GLUCOSE, CAPILLARY
Glucose-Capillary: 193 mg/dL — ABNORMAL HIGH (ref 65–99)
Glucose-Capillary: 341 mg/dL — ABNORMAL HIGH (ref 65–99)

## 2017-09-23 MED ORDER — PREDNISONE 10 MG PO TABS: ORAL_TABLET | ORAL | 0 refills | Status: DC

## 2017-09-23 MED ORDER — LEVOFLOXACIN 500 MG PO TABS: 500.0000 mg | ORAL_TABLET | Freq: Every day | ORAL | 0 refills | Status: AC

## 2017-09-23 MED ORDER — GUAIFENESIN ER 600 MG PO TB12: 600.0000 mg | ORAL_TABLET | Freq: Two times a day (BID) | ORAL | 2 refills | Status: DC

## 2017-09-23 MED ORDER — FUROSEMIDE 40 MG PO TABS: 40.0000 mg | ORAL_TABLET | Freq: Every day | ORAL | 0 refills | Status: DC

## 2017-09-23 NOTE — Care Management Note (Signed)
Case Management Note  Patient Details  Name: Angela Lawson MRN: [id number] Date of Birth: February 04, 1934  Expected Discharge Date:  09/23/17               Expected Discharge Plan:  Home/Self Care  In-House Referral:  NA  Discharge planning Services  CM Consult  Post Acute Care Choice:  NA Choice offered to:  NA  Status of Service:  Completed, signed off  Additional Comments: [place] home today. Pt cont to decline HH services. No CM needs noted at DC.   Sherald Barge, RN 09/23/2017, 10:50 AM

## 2017-09-23 NOTE — Care Management Important Message (Signed)
Important Message  Patient Details  Name: Angela Lawson MRN: [id number] Date of Birth: October 31, 1934   Medicare Important Message Given:  Yes    Sherald Barge, RN 09/23/2017, 10:51 AM

## 2017-09-23 NOTE — Progress Notes (Signed)
Inpatient Diabetes Program Recommendations  AACE/ADA: New Consensus Statement on Inpatient Glycemic Control (2015)  Target Ranges:  Prepandial:   less than 140 mg/dL      Peak postprandial:   less than 180 mg/dL (1-2 hours)      Critically ill patients:  140 - 180 mg/dL   Results for BRIGIT, DOKE (MRN [id number]) as of 09/23/2017 09:27  Ref. Range 09/22/2017 07:24 09/22/2017 11:40 09/22/2017 16:20 09/22/2017 21:13 09/23/2017 07:50  Glucose-Capillary Latest Ref Range: 65 - 99 mg/dL 211 (H) 251 (H) 180 (H) 208 (H) 193 (H)    Review of Glycemic Control  Diabetes history: DM2 Outpatient Diabetes medications: Tradjenta 5 mg daily Current orders for Inpatient glycemic control: Lantus 18 units QHS, Novolog 0-9 units TID with meals, Novolog 0-5 units QHS  Inpatient Diabetes Program Recommendations:  Insulin - Basal: If steroids are continued, please consider increasing Lantus to 20 units QHS. Insulin-Meal Coverage: If steroids are continued, please consider ordering Novolog 3 units TID with meals for meal coverage if patient eats at least 50% of meals.  Thanks, Barnie Alderman, RN, MSN, CDE Diabetes Coordinator Inpatient Diabetes Program [phone] (Team Pager from 8am to 5pm)

## 2017-09-23 NOTE — Progress Notes (Signed)
Patient being d/c home with instructions and  Family. Verbalizes understanding and have no questions at this time. IV cath removed and intact. No c/o pain at this time or at site. Patient ambulates to [facility] and tolerates well.

## 2017-09-23 NOTE — Discharge Summary (Signed)
Physician Discharge Summary  Angela Lawson SLH:[id number] DOB: [birth year] DOA: 09/20/2017  PCP: Rosita Fire, MD  Admit date: 09/20/2017 Discharge date: 09/23/2017  Admitted From: home Disposition:  home  Recommendations for Outpatient Follow-up:  1. Follow up with PCP in 1-2 weeks 2. Please obtain BMP/CBC in one week   Home Health: Equipment/Devices:  Discharge Condition: stable CODE STATUS: full code Diet recommendation: Heart Healthy / Carb Modified   Brief/Interim Summary: 83 year old female with history of insulin-dependent diabetes mellitus, stage IV lung cancer on chemotherapy, COPD with chronic hypoxic respiratory failure on 3 L oxygen at home, chronic diastolic CHF and hypertension presented with progressive dyspnea with cough vertigo of clear sputum and wheezing. For last few days. In the ED her sats were stable on 3 L via nasal cannula, afebrile. Chest x-ray showed pulmonary vascular congestion. CBC was normal and chemistry showed slightly worsened renal function from her baseline. Patient received DuoNeb, IV Solu Medrol continue to be dyspneic and was admitted to medical floor for COPD exacerbation.  Discharge Diagnoses:  Principal Problem:   COPD exacerbation ([place]) Active Problems:   Adenocarcinoma of lung (HCC)   CKD (chronic kidney disease) stage 3, GFR 30-59 ml/min (HCC)   Essential hypertension   Type II diabetes mellitus (HCC)   Chronic diastolic CHF (congestive heart failure) (HCC)   COPD with acute exacerbation (HCC)  COPD with acute exacerbation (HCC) Chronic hypoxic respiratory failure Patient was treated with IV Solu-Medrol, scheduled DuoNeb's, antitussives and supplemental oxygen. Chest x-ray shows possible right-sided consolidation.  She was covered with Levaquin for empiric antibiotic coverage. Respiratory status has improved and appears to be approaching baseline. Steroids have been transitioned to prednisone taper. She is back on 3 L and is able  to carry on a conversation. She is requesting discharge home.  Active Problems:   Adenocarcinoma of lung, stage IV ([place]) Follows with oncology and getting chemotherapy as outpatient. Next appointment in 1 week.    Acute kidney injury on CKD (chronic kidney disease) stage 3, GFR 30-59 ml/min (HCC) mildly worsened renal function from baseline creatinine of 1.5. Hold her lasix and ARB. Repeat labs in one week and consider resuming ARB. We'll resume Lasix on 10/20 in order to avoid precipitating heart failure.    Essential hypertension Stable. Resume home medications    Type II diabetes mellitus (HCC) CBG elevated on steroids. increase lantus to 18 units daily. Continue home dose Lantus and sliding scale coverage.     Chronic diastolic CHF (congestive heart failure) (HCC) Appears euvolemic. Continue  Metoprolol.  Hold lasix and ARB.  Discharge Instructions  Discharge Instructions    Diet - low sodium heart healthy    Complete by:  As directed    Increase activity slowly    Complete by:  As directed      Allergies as of 09/23/2017      Reactions   Lisinopril Cough      Medication List    STOP taking these medications   losartan 50 MG tablet Commonly known as:  COZAAR     TAKE these medications   alendronate 70 MG tablet Commonly known as:  FOSAMAX Take 70 mg by mouth every Friday.   aspirin EC 81 MG tablet Take 81 mg by mouth daily.   atorvastatin 40 MG tablet Commonly known as:  LIPITOR Take 40 mg by mouth at bedtime.   buPROPion 150 MG 12 hr tablet Commonly known as:  WELLBUTRIN SR Take 150 mg by mouth 2 (two) times daily.  cholecalciferol 1000 units tablet Commonly known as:  VITAMIN D Take 1,000 Units by mouth daily.   clotrimazole-betamethasone cream Commonly known as:  LOTRISONE Apply 1 application topically 2 (two) times daily.   FISH OIL PO Take 1 capsule by mouth daily.   furosemide 40 MG tablet Commonly known as:  LASIX Take 1 tablet (40  mg total) by mouth daily. Pt is able to take one additional tablet daily for weight increases of 3lbs in a day or 5lbs in a week. Resume on 10/20 What changed:  additional instructions   gabapentin 300 MG capsule Commonly known as:  NEURONTIN Take 600 mg by mouth at bedtime.   Garlic 160 MG Tabs Take 300 mg by mouth daily.   guaiFENesin 600 MG 12 hr tablet Commonly known as:  MUCINEX Take 1 tablet (600 mg total) by mouth 2 (two) times daily.   ipratropium-albuterol 0.5-2.5 (3) MG/3ML Soln Commonly known as:  DUONEB Inhale 3 mLs into the lungs every 6 (six) hours as needed (for wheezing/shortness of breath).   KLOR-CON 10 10 MEQ tablet Generic drug:  potassium chloride TAKE 1 TABLET (10 MEQ TOTAL) BY MOUTH DAILY. *FURTHER REFILLS NEED TO BE AUTHORIZED BY PCP*   LANTUS SOLOSTAR 100 UNIT/ML Solostar Pen Generic drug:  Insulin Glargine Inject 15 Units into the skin at bedtime.   levofloxacin 500 MG tablet Commonly known as:  LEVAQUIN Take 1 tablet (500 mg total) by mouth daily.   lidocaine-prilocaine cream Commonly known as:  EMLA Apply to affected area once   linagliptin 5 MG Tabs tablet Commonly known as:  TRADJENTA Take 5 mg by mouth daily.   loratadine 10 MG tablet Commonly known as:  CLARITIN Take 10 mg by mouth daily.   meclizine 25 MG tablet Commonly known as:  ANTIVERT Take 25 mg by mouth daily.   metoprolol succinate 25 MG 24 hr tablet Commonly known as:  TOPROL-XL Take 1 tablet (25 mg total) by mouth daily.   nitroGLYCERIN 0.4 MG SL tablet Commonly known as:  NITROSTAT Place 0.4 mg under the tongue every 5 (five) minutes as needed for chest pain.   omeprazole 40 MG capsule Commonly known as:  PRILOSEC Take 40 mg by mouth daily.   ondansetron 8 MG tablet Commonly known as:  ZOFRAN Take 1 tablet (8 mg total) by mouth 2 (two) times daily as needed (Nausea or vomiting).   OXYGEN Inhale 3 L into the lungs continuous.   polyethylene glycol powder  powder Commonly known as:  GLYCOLAX/MIRALAX Take 1 capful daily as needed. What changed:  how much to take  how to take this  when to take this  reasons to take this  additional instructions   predniSONE 10 MG tablet Commonly known as:  DELTASONE Take 40mg  po daily for 2 days then 30mg  daily for 2 days then 20mg  daily for 2 days then 10mg  daily for 2 days then stop   PROAIR HFA 108 (90 Base) MCG/ACT inhaler Generic drug:  albuterol Inhale 2 puffs into the lungs every 4 (four) hours as needed for wheezing or shortness of breath.   prochlorperazine 10 MG tablet Commonly known as:  COMPAZINE Take 1 tablet (10 mg total) by mouth every 6 (six) hours as needed (Nausea or vomiting).   pyridoxine 100 MG tablet Commonly known as:  B-6 Take 100 mg by mouth daily.   rOPINIRole 0.5 MG tablet Commonly known as:  REQUIP Take 0.5 mg by mouth 2 (two) times daily.   sodium chloride  0.65 % Soln nasal spray Commonly known as:  OCEAN Place 1 spray into both nostrils every 3 (three) hours as needed for congestion.   TECENTRIQ IV Inject into the vein. Every 3 weeks   traMADol 50 MG tablet Commonly known as:  ULTRAM Take 50 mg by mouth every 12 (twelve) hours as needed for moderate pain.   zolpidem 5 MG tablet Commonly known as:  AMBIEN Take 5 mg by mouth at bedtime as needed for sleep.       Allergies  Allergen Reactions  . Lisinopril Cough    Consultations:     Procedures/Studies: Dg Chest 2 View  Result Date: 09/20/2017 CLINICAL DATA:  Dyspnea, worsened over the past 2 days. EXAM: CHEST  2 VIEW COMPARISON:  07/19/2017 FINDINGS: Marked cardiomegaly, unchanged. Moderate vascular and interstitial prominence, unchanged or mildly worsened. No pleural effusions. No focal airspace consolidation. Right jugular port appears satisfactorily positioned, terminating in the low SVC. IMPRESSION: Unchanged cardiomegaly. Moderate vascular and interstitial prominence, without alveolar  edema or airspace consolidation. Electronically Signed   By: Andreas Newport M.D.   On: 09/20/2017 22:34        Subjective: Shortness of breath is better today  Discharge Exam: Vitals:   09/23/17 0752 09/23/17 1003  BP:  (!) 135/45  Pulse:  78  Resp:    Temp:    SpO2: 94%    Vitals:   09/23/17 0500 09/23/17 0720 09/23/17 0752 09/23/17 1003  BP:  (!) 139/58  (!) 135/45  Pulse:  75  78  Resp:  20    Temp:  98.5 F (36.9 C)    TempSrc:  Oral    SpO2:  98% 94%   Weight: 81.3 kg (179 lb 2 oz)     Height:        General: Pt is alert, awake, not in acute distress Cardiovascular: RRR, S1/S2 +, no rubs, no gallops Respiratory: CTA bilaterally, no wheezing, no rhonchi Abdominal: Soft, NT, ND, bowel sounds + Extremities: no edema, no cyanosis    The results of significant diagnostics from this hospitalization (including imaging, microbiology, ancillary and laboratory) are listed below for reference.     Microbiology: No results found for this or any previous visit (from the past 240 hour(s)).   Labs: BNP (last 3 results)  Recent Labs  04/04/17 1530 04/26/17 1859 06/22/17 2331  BNP 9.0 58.0 27.7   Basic Metabolic Panel:  Recent Labs Lab 09/20/17 2054 09/21/17 0923 09/22/17 0635  NA 140 135 135  K 4.6 5.0 4.8  CL 101 98* 97*  CO2 28 23 26   GLUCOSE 112* 237* 213*  BUN 24* 27* 39*  CREATININE 1.65* 1.80* 1.89*  CALCIUM 9.9 9.6 9.5   Liver Function Tests:  Recent Labs Lab 09/20/17 2054  AST 25  ALT 20  ALKPHOS 58  BILITOT 0.6  PROT 7.6  ALBUMIN 4.1   No results for input(s): LIPASE, AMYLASE in the last 168 hours. No results for input(s): AMMONIA in the last 168 hours. CBC:  Recent Labs Lab 09/20/17 2054 09/21/17 0923  WBC 9.7 10.5  NEUTROABS 6.1  --   HGB 12.8 13.0  HCT 40.4 42.7  MCV 94.0 94.9  PLT 203 226   Cardiac Enzymes: No results for input(s): CKTOTAL, CKMB, CKMBINDEX, TROPONINI in the last 168 hours. BNP: Invalid input(s):  POCBNP CBG:  Recent Labs Lab 09/22/17 1140 09/22/17 1620 09/22/17 2113 09/23/17 0750 09/23/17 1122  GLUCAP 251* 180* 208* 193* 341*   D-Dimer No results  for input(s): DDIMER in the last 72 hours. Hgb A1c No results for input(s): HGBA1C in the last 72 hours. Lipid Profile No results for input(s): CHOL, HDL, LDLCALC, TRIG, CHOLHDL, LDLDIRECT in the last 72 hours. Thyroid function studies No results for input(s): TSH, T4TOTAL, T3FREE, THYROIDAB in the last 72 hours.  Invalid input(s): FREET3 Anemia work up No results for input(s): VITAMINB12, FOLATE, FERRITIN, TIBC, IRON, RETICCTPCT in the last 72 hours. Urinalysis    Component Value Date/Time   COLORURINE YELLOW 04/26/2017 2112   APPEARANCEUR CLEAR 04/26/2017 2112   LABSPEC 1.008 04/26/2017 2112   PHURINE 6.0 04/26/2017 2112   GLUCOSEU NEGATIVE 04/26/2017 2112   HGBUR NEGATIVE 04/26/2017 2112   BILIRUBINUR NEGATIVE 04/26/2017 2112   KETONESUR NEGATIVE 04/26/2017 2112   PROTEINUR NEGATIVE 04/26/2017 2112   UROBILINOGEN 0.2 04/19/2012 1411   NITRITE NEGATIVE 04/26/2017 2112   LEUKOCYTESUR TRACE (A) 04/26/2017 2112   Sepsis Labs Invalid input(s): PROCALCITONIN,  WBC,  LACTICIDVEN Microbiology No results found for this or any previous visit (from the past 240 hour(s)).   Time coordinating discharge: Over 30 minutes  SIGNED:   Kathie Dike, MD  [facility] 09/23/2017, 6:29 PM Pager   If 7PM-7AM, please contact night-coverage [email or web address] Password [email or web address]

## 2017-09-24 DIAGNOSIS — J449 Chronic obstructive pulmonary disease, unspecified: Secondary | ICD-10-CM | POA: Diagnosis not present

## 2017-09-28 ENCOUNTER — Ambulatory Visit (HOSPITAL_COMMUNITY)
Admission: RE | Admit: 2017-09-28 | Discharge: 2017-09-28 | Disposition: A | Discharge status: Home / Self Care | Payer: Medicare Other | Source: Ambulatory Visit | Attending: Adult Health | Admitting: Adult Health

## 2017-09-28 ENCOUNTER — Other Ambulatory Visit (HOSPITAL_COMMUNITY): Payer: Self-pay | Admitting: Emergency Medicine

## 2017-09-28 DIAGNOSIS — C3490 Malignant neoplasm of unspecified part of unspecified bronchus or lung: Secondary | ICD-10-CM

## 2017-09-28 NOTE — Progress Notes (Signed)
CT called and states that creatine is 1.89.  Pt weill have to have CT scans without contrast or reschedule until next week.  Spoke with Dr Talbert Cage and she wants the scans rescheduled until next week and fluids 3 times this week.  Scans and follow up rescheduled and pt notified of appts.

## 2017-09-29 ENCOUNTER — Encounter (HOSPITAL_COMMUNITY): Payer: Self-pay

## 2017-09-29 ENCOUNTER — Encounter (HOSPITAL_BASED_OUTPATIENT_CLINIC_OR_DEPARTMENT_OTHER): Payer: Medicare Other

## 2017-09-29 VITALS — BP 131/54 | HR 77 | Temp 98.6°F | Resp 20

## 2017-09-29 DIAGNOSIS — C3412 Malignant neoplasm of upper lobe, left bronchus or lung: Secondary | ICD-10-CM | POA: Diagnosis not present

## 2017-09-29 DIAGNOSIS — C3490 Malignant neoplasm of unspecified part of unspecified bronchus or lung: Secondary | ICD-10-CM

## 2017-09-29 DIAGNOSIS — C3431 Malignant neoplasm of lower lobe, right bronchus or lung: Secondary | ICD-10-CM

## 2017-09-29 MED ORDER — HEPARIN SOD (PORK) LOCK FLUSH 100 UNIT/ML IV SOLN
500.0000 [IU] | Freq: Once | INTRAVENOUS | Status: AC
  Administered 2017-09-29: 500 [IU] via INTRAVENOUS

## 2017-09-29 MED ORDER — SODIUM CHLORIDE 0.9 % IV SOLN
Freq: Once | INTRAVENOUS | Status: AC
  Administered 2017-09-29: 12:00:00 via INTRAVENOUS

## 2017-09-29 NOTE — Progress Notes (Signed)
Tolerated infusion w/o adverse reaction.  Alert, in no distress.  VSS.  Discharged via wheelchair in c/o family.  

## 2017-10-01 ENCOUNTER — Ambulatory Visit (HOSPITAL_COMMUNITY): Payer: Medicare Other

## 2017-10-01 ENCOUNTER — Encounter (HOSPITAL_COMMUNITY): Payer: Medicare Other

## 2017-10-01 ENCOUNTER — Encounter (HOSPITAL_BASED_OUTPATIENT_CLINIC_OR_DEPARTMENT_OTHER): Payer: Medicare Other

## 2017-10-01 ENCOUNTER — Encounter (HOSPITAL_COMMUNITY): Payer: Self-pay

## 2017-10-01 VITALS — BP 184/85 | HR 70 | Temp 98.0°F | Resp 20 | Wt 178.6 lb

## 2017-10-01 DIAGNOSIS — C3492 Malignant neoplasm of unspecified part of left bronchus or lung: Secondary | ICD-10-CM | POA: Diagnosis not present

## 2017-10-01 DIAGNOSIS — C3431 Malignant neoplasm of lower lobe, right bronchus or lung: Secondary | ICD-10-CM | POA: Diagnosis not present

## 2017-10-01 DIAGNOSIS — Z5112 Encounter for antineoplastic immunotherapy: Secondary | ICD-10-CM | POA: Diagnosis not present

## 2017-10-01 DIAGNOSIS — C3412 Malignant neoplasm of upper lobe, left bronchus or lung: Secondary | ICD-10-CM | POA: Diagnosis not present

## 2017-10-01 DIAGNOSIS — N183 Chronic kidney disease, stage 3 unspecified: Secondary | ICD-10-CM

## 2017-10-01 DIAGNOSIS — C3490 Malignant neoplasm of unspecified part of unspecified bronchus or lung: Secondary | ICD-10-CM

## 2017-10-01 DIAGNOSIS — Z7189 Other specified counseling: Secondary | ICD-10-CM

## 2017-10-01 LAB — COMPREHENSIVE METABOLIC PANEL
ALT: 19 U/L (ref 14–54)
AST: 16 U/L (ref 15–41)
Albumin: 3.3 g/dL — ABNORMAL LOW (ref 3.5–5.0)
Alkaline Phosphatase: 42 U/L (ref 38–126)
Anion gap: 6 (ref 5–15)
BUN: 18 mg/dL (ref 6–20)
CO2: 29 mmol/L (ref 22–32)
Calcium: 8.8 mg/dL — ABNORMAL LOW (ref 8.9–10.3)
Chloride: 101 mmol/L (ref 101–111)
Creatinine, Ser: 1.23 mg/dL — ABNORMAL HIGH (ref 0.44–1.00)
GFR calc Af Amer: 46 mL/min — ABNORMAL LOW (ref >60)
GFR calc non Af Amer: 40 mL/min — ABNORMAL LOW (ref >60)
Glucose, Bld: 140 mg/dL — ABNORMAL HIGH (ref 65–99)
Potassium: 4.4 mmol/L (ref 3.5–5.1)
Sodium: 136 mmol/L (ref 135–145)
Total Bilirubin: 0.4 mg/dL (ref 0.3–1.2)
Total Protein: 6.1 g/dL — ABNORMAL LOW (ref 6.5–8.1)

## 2017-10-01 LAB — CBC WITH DIFFERENTIAL/PLATELET
Basophils Absolute: 0 10*3/uL (ref 0.0–0.1)
Basophils Relative: 0 %
Eosinophils Absolute: 0.1 10*3/uL (ref 0.0–0.7)
Eosinophils Relative: 1 %
HCT: 38 % (ref 36.0–46.0)
Hemoglobin: 11.8 g/dL — ABNORMAL LOW (ref 12.0–15.0)
Lymphocytes Relative: 12 %
Lymphs Abs: 1.3 10*3/uL (ref 0.7–4.0)
MCH: 29.6 pg (ref 26.0–34.0)
MCHC: 31.1 g/dL (ref 30.0–36.0)
MCV: 95.5 fL (ref 78.0–100.0)
Monocytes Absolute: 0.8 10*3/uL (ref 0.1–1.0)
Monocytes Relative: 8 %
Neutro Abs: 8.7 10*3/uL — ABNORMAL HIGH (ref 1.7–7.7)
Neutrophils Relative %: 79 %
Platelets: 179 10*3/uL (ref 150–400)
RBC: 3.98 MIL/uL (ref 3.87–5.11)
RDW: 16.1 % — ABNORMAL HIGH (ref 11.5–15.5)
WBC: 10.9 10*3/uL — ABNORMAL HIGH (ref 4.0–10.5)

## 2017-10-01 LAB — TSH: TSH: 0.961 u[IU]/mL (ref 0.350–4.500)

## 2017-10-01 MED ORDER — SODIUM CHLORIDE 0.9% FLUSH
10.0000 mL | INTRAVENOUS | Status: DC | PRN
  Administered 2017-10-01: 10 mL
  Filled 2017-10-01: qty 10

## 2017-10-01 MED ORDER — HEPARIN SOD (PORK) LOCK FLUSH 100 UNIT/ML IV SOLN
500.0000 [IU] | Freq: Once | INTRAVENOUS | Status: AC | PRN
  Administered 2017-10-01: 500 [IU]
  Filled 2017-10-01: qty 5

## 2017-10-01 MED ORDER — SODIUM CHLORIDE 0.9 % IV SOLN
INTRAVENOUS | Status: DC
  Administered 2017-10-01: 500 mL via INTRAVENOUS

## 2017-10-01 MED ORDER — SODIUM CHLORIDE 0.9 % IV SOLN
Freq: Once | INTRAVENOUS | Status: AC
  Administered 2017-10-01: 500 mL via INTRAVENOUS

## 2017-10-01 MED ORDER — SODIUM CHLORIDE 0.9 % IV SOLN
1200.0000 mg | Freq: Once | INTRAVENOUS | Status: AC
  Administered 2017-10-01: 1200 mg via INTRAVENOUS
  Filled 2017-10-01: qty 20

## 2017-10-01 NOTE — Patient Instructions (Signed)
[facility] Discharge Instructions for Patients Receiving Chemotherapy  Today you received the following chemotherapy agents tycentriq and hydration.   To help prevent nausea and vomiting after your treatment, we encourage you to take your nausea medication.   If you develop nausea and vomiting that is not controlled by your nausea medication, call the clinic.   BELOW ARE SYMPTOMS THAT SHOULD BE REPORTED IMMEDIATELY:  *FEVER GREATER THAN 100.5 F  *CHILLS WITH OR WITHOUT FEVER  NAUSEA AND VOMITING THAT IS NOT CONTROLLED WITH YOUR NAUSEA MEDICATION  *UNUSUAL SHORTNESS OF BREATH  *UNUSUAL BRUISING OR BLEEDING  TENDERNESS IN MOUTH AND THROAT WITH OR WITHOUT PRESENCE OF ULCERS  *URINARY PROBLEMS  *BOWEL PROBLEMS  UNUSUAL RASH Items with * indicate a potential emergency and should be followed up as soon as possible.  Feel free to call the clinic should you have any questions or concerns. The clinic phone number is [phone].  Please show the [facility] at check-in to the Emergency Department and triage nurse.

## 2017-10-01 NOTE — Progress Notes (Signed)
Reviewed labs with Dr. Talbert Cage and ok to treat today.  Today is day 2 for hydration for CT scan next week due to serum creatinine and diabetes type 2 and contrast media to be used for CT scan.    Port accessed with no blood return.  No bruising or swelling note with flush.  Patient denied pain.    Patient tolerated hydration and chemotherapy with no complaints.  Blood pressure 184/85 and patient stated she has not taken her blood pressure medication yet.  Denied pain at this time.  Patient voided with no complaints.  Port site clean and dry with no bruising or swelling noted at site.  Band aid applied.  Patient discharged with VSS and left via wheelchair with no complaints voiced.  No s/s of distress noted.

## 2017-10-05 ENCOUNTER — Encounter (HOSPITAL_BASED_OUTPATIENT_CLINIC_OR_DEPARTMENT_OTHER): Payer: Medicare Other

## 2017-10-05 ENCOUNTER — Encounter (HOSPITAL_COMMUNITY): Payer: Self-pay

## 2017-10-05 VITALS — BP 112/61 | HR 79 | Temp 98.0°F | Resp 20

## 2017-10-05 DIAGNOSIS — C3490 Malignant neoplasm of unspecified part of unspecified bronchus or lung: Secondary | ICD-10-CM

## 2017-10-05 DIAGNOSIS — C3492 Malignant neoplasm of unspecified part of left bronchus or lung: Secondary | ICD-10-CM | POA: Diagnosis not present

## 2017-10-05 DIAGNOSIS — C3412 Malignant neoplasm of upper lobe, left bronchus or lung: Secondary | ICD-10-CM | POA: Diagnosis not present

## 2017-10-05 DIAGNOSIS — C3431 Malignant neoplasm of lower lobe, right bronchus or lung: Secondary | ICD-10-CM

## 2017-10-05 DIAGNOSIS — Z5189 Encounter for other specified aftercare: Secondary | ICD-10-CM | POA: Diagnosis not present

## 2017-10-05 DIAGNOSIS — M25511 Pain in right shoulder: Secondary | ICD-10-CM

## 2017-10-05 LAB — COMPREHENSIVE METABOLIC PANEL
ALT: 16 U/L (ref 14–54)
AST: 14 U/L — ABNORMAL LOW (ref 15–41)
Albumin: 3.1 g/dL — ABNORMAL LOW (ref 3.5–5.0)
Alkaline Phosphatase: 34 U/L — ABNORMAL LOW (ref 38–126)
Anion gap: 7 (ref 5–15)
BUN: 17 mg/dL (ref 6–20)
CO2: 27 mmol/L (ref 22–32)
Calcium: 8.4 mg/dL — ABNORMAL LOW (ref 8.9–10.3)
Chloride: 105 mmol/L (ref 101–111)
Creatinine, Ser: 1.19 mg/dL — ABNORMAL HIGH (ref 0.44–1.00)
GFR calc Af Amer: 48 mL/min — ABNORMAL LOW (ref >60)
GFR calc non Af Amer: 42 mL/min — ABNORMAL LOW (ref >60)
Glucose, Bld: 183 mg/dL — ABNORMAL HIGH (ref 65–99)
Potassium: 3.8 mmol/L (ref 3.5–5.1)
Sodium: 139 mmol/L (ref 135–145)
Total Bilirubin: 0.5 mg/dL (ref 0.3–1.2)
Total Protein: 5.7 g/dL — ABNORMAL LOW (ref 6.5–8.1)

## 2017-10-05 LAB — CBC WITH DIFFERENTIAL/PLATELET
Basophils Absolute: 0 10*3/uL (ref 0.0–0.1)
Basophils Relative: 0 %
Eosinophils Absolute: 0.2 10*3/uL (ref 0.0–0.7)
Eosinophils Relative: 2 %
HCT: 36.4 % (ref 36.0–46.0)
Hemoglobin: 11.3 g/dL — ABNORMAL LOW (ref 12.0–15.0)
Lymphocytes Relative: 19 %
Lymphs Abs: 1.7 10*3/uL (ref 0.7–4.0)
MCH: 30.1 pg (ref 26.0–34.0)
MCHC: 31 g/dL (ref 30.0–36.0)
MCV: 97.1 fL (ref 78.0–100.0)
Monocytes Absolute: 0.9 10*3/uL (ref 0.1–1.0)
Monocytes Relative: 10 %
Neutro Abs: 6.3 10*3/uL (ref 1.7–7.7)
Neutrophils Relative %: 69 %
Platelets: 138 10*3/uL — ABNORMAL LOW (ref 150–400)
RBC: 3.75 MIL/uL — ABNORMAL LOW (ref 3.87–5.11)
RDW: 16.1 % — ABNORMAL HIGH (ref 11.5–15.5)
WBC: 9.1 10*3/uL (ref 4.0–10.5)

## 2017-10-05 MED ORDER — ACETAMINOPHEN 325 MG PO TABS
650.0000 mg | ORAL_TABLET | Freq: Once | ORAL | Status: AC
  Administered 2017-10-05: 650 mg via ORAL

## 2017-10-05 MED ORDER — ACETAMINOPHEN 325 MG PO TABS
ORAL_TABLET | ORAL | Status: AC
  Filled 2017-10-05: qty 2

## 2017-10-05 MED ORDER — SODIUM CHLORIDE 0.9% FLUSH
10.0000 mL | Freq: Once | INTRAVENOUS | Status: AC
  Administered 2017-10-05: 10 mL via INTRAVENOUS

## 2017-10-05 MED ORDER — HEPARIN SOD (PORK) LOCK FLUSH 100 UNIT/ML IV SOLN
500.0000 [IU] | Freq: Once | INTRAVENOUS | Status: AC
  Administered 2017-10-05: 500 [IU] via INTRAVENOUS
  Filled 2017-10-05: qty 5

## 2017-10-05 MED ORDER — SODIUM CHLORIDE 0.9 % IV SOLN
Freq: Once | INTRAVENOUS | Status: AC
  Administered 2017-10-05: 12:00:00 via INTRAVENOUS

## 2017-10-05 NOTE — Patient Instructions (Signed)
[place] at [facility]  Discharge Instructions:  You received hydration today.  Keep CT scan as scheduled and return for next appointment as directed.   _______________________________________________________________  Thank you for choosing [place] at [facility] to provide your oncology and hematology care.  To afford each patient quality time with our providers, please arrive at least 15 minutes before your scheduled appointment.  You need to re-schedule your appointment if you arrive 10 or more minutes late.  We strive to give you quality time with our providers, and arriving late affects you and other patients whose appointments are after yours.  Also, if you no show three or more times for appointments you may be dismissed from the clinic.  Again, thank you for choosing [place] at [place] hope is that these requests will allow you access to exceptional care and in a timely manner. _______________________________________________________________  If you have questions after your visit, please contact our office at [phone] between the hours of 8:30 a.m. and 5:00 p.m. Voicemails left after 4:30 p.m. will not be returned until the following business day. _______________________________________________________________  For prescription refill requests, have your pharmacy contact our office. _______________________________________________________________  Recommendations made by the consultant and any test results will be sent to your referring physician. _______________________________________________________________

## 2017-10-05 NOTE — Progress Notes (Signed)
To treatment area for hydration and lab work.  No complaints voiced.  Stated did well after Friday's hydration.    1315-patient complains of right shoulder arthritis discomfort.  Rates 4 on scale and asking for pain relief medication.  Patient may have Tylenol 650mg  by mouth verbal order Dr. Talbert Cage.   Patient tolerated hydration with no complaints voiced.  Shoulder discomfort rated 1 on pain scale.  Port site clean and dry with no bruising or swelling noted at site.  Band aid applied.  VSS with discharge and left via wheelchair with family.

## 2017-10-08 ENCOUNTER — Ambulatory Visit: Admission: RE | Admit: 2017-10-08 | Payer: Medicare Other | Source: Ambulatory Visit

## 2017-10-08 ENCOUNTER — Ambulatory Visit (HOSPITAL_COMMUNITY)
Admission: RE | Admit: 2017-10-08 | Discharge: 2017-10-08 | Disposition: A | Discharge status: Home / Self Care | Payer: Medicare Other | Source: Ambulatory Visit | Attending: Adult Health | Admitting: Adult Health

## 2017-10-08 ENCOUNTER — Ambulatory Visit (HOSPITAL_COMMUNITY): Payer: Medicare Other

## 2017-10-08 DIAGNOSIS — K449 Diaphragmatic hernia without obstruction or gangrene: Secondary | ICD-10-CM | POA: Insufficient documentation

## 2017-10-08 DIAGNOSIS — R918 Other nonspecific abnormal finding of lung field: Secondary | ICD-10-CM | POA: Insufficient documentation

## 2017-10-08 DIAGNOSIS — I2510 Atherosclerotic heart disease of native coronary artery without angina pectoris: Secondary | ICD-10-CM | POA: Diagnosis not present

## 2017-10-08 DIAGNOSIS — M12852 Other specific arthropathies, not elsewhere classified, left hip: Secondary | ICD-10-CM | POA: Insufficient documentation

## 2017-10-08 DIAGNOSIS — C3490 Malignant neoplasm of unspecified part of unspecified bronchus or lung: Secondary | ICD-10-CM | POA: Insufficient documentation

## 2017-10-08 DIAGNOSIS — K5730 Diverticulosis of large intestine without perforation or abscess without bleeding: Secondary | ICD-10-CM | POA: Insufficient documentation

## 2017-10-08 DIAGNOSIS — J432 Centrilobular emphysema: Secondary | ICD-10-CM | POA: Diagnosis not present

## 2017-10-08 DIAGNOSIS — I700 Atherosclerosis of aorta: Secondary | ICD-10-CM | POA: Insufficient documentation

## 2017-10-08 DIAGNOSIS — C3492 Malignant neoplasm of unspecified part of left bronchus or lung: Secondary | ICD-10-CM | POA: Diagnosis not present

## 2017-10-08 MED ORDER — IOPAMIDOL (ISOVUE-300) INJECTION 61%: 100.0000 mL | Freq: Once | INTRAVENOUS | Status: DC | PRN

## 2017-10-08 MED ORDER — IOPAMIDOL (ISOVUE-300) INJECTION 61%
75.0000 mL | Freq: Once | INTRAVENOUS | Status: AC | PRN
  Administered 2017-10-08: 75 mL via INTRAVENOUS

## 2017-10-09 ENCOUNTER — Ambulatory Visit (HOSPITAL_COMMUNITY)
Admission: RE | Admit: 2017-10-09 | Discharge: 2017-10-09 | Disposition: A | Discharge status: Home / Self Care | Payer: Medicare Other | Source: Ambulatory Visit | Attending: Adult Health | Admitting: Adult Health

## 2017-10-09 ENCOUNTER — Encounter (HOSPITAL_COMMUNITY): Payer: Self-pay | Admitting: Oncology

## 2017-10-09 ENCOUNTER — Encounter (HOSPITAL_COMMUNITY): Payer: Medicare Other | Attending: Hematology & Oncology | Admitting: Oncology

## 2017-10-09 ENCOUNTER — Other Ambulatory Visit: Payer: Self-pay | Admitting: Adult Health

## 2017-10-09 VITALS — BP 107/55 | HR 87 | Temp 98.7°F | Resp 20 | Ht 64.0 in | Wt 182.0 lb

## 2017-10-09 DIAGNOSIS — R918 Other nonspecific abnormal finding of lung field: Secondary | ICD-10-CM | POA: Diagnosis not present

## 2017-10-09 DIAGNOSIS — I2510 Atherosclerotic heart disease of native coronary artery without angina pectoris: Secondary | ICD-10-CM | POA: Insufficient documentation

## 2017-10-09 DIAGNOSIS — K449 Diaphragmatic hernia without obstruction or gangrene: Secondary | ICD-10-CM | POA: Insufficient documentation

## 2017-10-09 DIAGNOSIS — K5730 Diverticulosis of large intestine without perforation or abscess without bleeding: Secondary | ICD-10-CM | POA: Insufficient documentation

## 2017-10-09 DIAGNOSIS — C3490 Malignant neoplasm of unspecified part of unspecified bronchus or lung: Secondary | ICD-10-CM

## 2017-10-09 DIAGNOSIS — C801 Malignant (primary) neoplasm, unspecified: Secondary | ICD-10-CM

## 2017-10-09 DIAGNOSIS — M12852 Other specific arthropathies, not elsewhere classified, left hip: Secondary | ICD-10-CM | POA: Insufficient documentation

## 2017-10-09 DIAGNOSIS — C3400 Malignant neoplasm of unspecified main bronchus: Secondary | ICD-10-CM

## 2017-10-09 DIAGNOSIS — C3492 Malignant neoplasm of unspecified part of left bronchus or lung: Secondary | ICD-10-CM | POA: Insufficient documentation

## 2017-10-09 DIAGNOSIS — I700 Atherosclerosis of aorta: Secondary | ICD-10-CM | POA: Insufficient documentation

## 2017-10-09 DIAGNOSIS — M12851 Other specific arthropathies, not elsewhere classified, right hip: Secondary | ICD-10-CM | POA: Insufficient documentation

## 2017-10-09 DIAGNOSIS — J432 Centrilobular emphysema: Secondary | ICD-10-CM | POA: Insufficient documentation

## 2017-10-09 MED ORDER — OXYCODONE HCL 5 MG PO TABS: ORAL_TABLET | ORAL | 0 refills | Status: DC

## 2017-10-09 NOTE — Progress Notes (Signed)
Angela Lawson, [place] [id number]   CLINIC:  Medical Oncology/Hematology  PCP:  Rosita Fire, MD [place] [id number] [phone]   REASON FOR VISIT:  New/recurrent adenocarcinoma of the left and right lung; h/o Stage IA NSCLC s/p resection in 2009.   CURRENT THERAPY: Initial work-up    BRIEF ONCOLOGIC HISTORY:    Adenocarcinoma of lung ([place])   07/07/2008 Surgery    Performed in [place], [place].  Consistent with poorly differentiated adenocarcinoma.      02/27/2014 Imaging    CT of chest demonstrating a RLL solid nodular component measuring 9 mm at the site of previous ground-glass opacity. Slight increase in size of an anterior mediastinal node measuring 1.1 cm with stable 1 cm pretracheal lymph node. Consider PET      03/20/2014 Imaging    PET scan- No hypermetabolic pulmonary lesions to suggest recurrent or metastatic pulmonary disease.  Weakly positive left internal mammary lymph node and left axillary lymph node (? inflammatory process involving the left breast).      11/07/2015 Imaging    CT chest- 1. No evidence of metastatic involvement of the lungs. The previously noted small noncalcified lung nodules are stable. 2. Vague focal opacity in the right lower lobe may represent scarring and is stable. Continued followup however is recommended in 1 year.      01/08/2017 Imaging    CT chest- 1. New nodules along a thick walled bullous lesion in the right lower lobe, suspicious for malignancy. 2. New fairly large region of confluent ground-glass opacity posteriorly in the remaining left lung, low grade adenocarcinoma is not excluded and there is mild new nodularity along the upper margin of this process. 3. Stable biapical pleuroparenchymal scarring with several stable nodules in the left lung. 4. Borderline adenopathy in the mediastinum, essentially stable from prior. 5. Coronary, aortic arch, and branch  vessel atherosclerotic vascular disease. 6. Small hiatal hernia. 7. Hypodense left thyroid lesion. If not previously worked up, thyroid ultrasound could be employed.      01/21/2017 PET scan    1. New hypermetabolic adenopathy in the mediastinum and hila, with progressive hypermetabolic ground-glass density posteriorly in the left lower lobe with faint nodular components, and some low-grade hypermetabolic activity in ground-glass opacity dependently in the right upper lobe. Although the appearance is concerning for malignancy potentially with a lymphangitic component in the left lower lobe, the unusual configuration in the appearance of the ground-glass opacities could conceivably represent an atypical granulomatous infectious process as an alternative. This is not classic obvious recurrence with well-defined mass or nodules. 2. There is some ill-defined clustered nodularity in the right lower lobe only faintly metabolic, maximum SUV 3.0. 3. Overall I believe that this process require surveillance to differentiate an inflammatory/infectious condition from recurrent malignancy. The adenopathy is certainly concerning. 4. No findings of malignancy in the neck, abdomen/pelvis, or skeleton. 5. Sigmoid colon diverticulosis. 6. Emphysema. 7. Chronic ethmoid sinusitis.      03/25/2017 Imaging    CT chest- Stable 5.9 cm left lower lobe ground-glass opacity, hypermetabolic on prior PET, worrisome for primary bronchogenic neoplasm such as low-grade adenocarcinoma.  Stable 2.2 cm cavitary lesion in the central right lower lobe with surrounding hypermetabolic nodularity measuring up to 1.8 cm, worrisome for primary bronchogenic neoplasm such as squamous cell carcinoma.  Hypermetabolic mediastinal lymph nodes measuring up to 11 mm short axis, as above. Bilateral hilar regions are hypermetabolic on PET but poorly evaluated on  unenhanced CT.  Emphysema and aortic atherosclerosis.       04/23/2017 Procedure    Bronchoscopy and EBUS with mediastinal lymph node aspirations by Dr. Roxan Hockey.      04/26/2017 Pathology Results    1. Lung, biopsy, Left upper lobe - ADENOCARCINOMA. - SEE COMMENT. 2. Lung, biopsy, Right lower lobe - ADENOCARCINOMA. - SEE COMMENT. 3. Lung, biopsy, Right lower lobe target 2 - ATYPICAL CELLS. - SEE COMMENT.      04/26/2017 Relapse/Recurrence         05/14/2017 Pathology Results    PDL1 NEGATIVE.  TPS 0%.      05/19/2017 Pathology Results    FoundationOne testing- MS-stable.  TMB 6 Muts/Mb (Intermediate).  STK11 E293*.  Negative for EGFR, KRAS, ALK, BRAF, MET, RET, ERBB2, and ROS1.  Potential treatment options: Atezolizumab/Durvalumab/Nivolumab/Pembrolizumab for TMB score.      07/27/2017 Imaging    CT C/A/P: IMPRESSION: Resolution of right lower lobe lesion and nodules.  Unchanged ill-defined nonspecific left lower lobe ground-glass opacity and stable mildly prominent mediastinal lymph nodes.  No evidence of metastatic disease or acute abnormality within the abdomen or pelvis.  Aortic Atherosclerosis (ICD10-I70.0) and Emphysema (ICD10-J43.9).        HISTORY OF PRESENT ILLNESS:  (From Kirby Crigler, PA-C's last note on 12/05/16)   On 01/07/17, she had CT chest for surveillance of her lung cancer; new nodules were noted and concerning for malignancy.  She was recently admitted on 01/10/17-01/13/17 for COPD exacerbation; she was discharged on oral steroids at that time.  On 01/21/17, she underwent PET scan revealing new hypermetabolic adenopathy in the mediastinum and hilum, with progressive hypermetabolic groundglass density in the left lower lobe; there is mention of possible concern of malignancy with a lymphangitic component in the left lower lobe, but this may also represent an atypical granulomatous infectious process as well; there were no findings of malignancy in the neck, abdomen/pelvis, or skeleton. It was recommended by  cardiothoracic surgery that we repeat imaging in 2-3 months after hospitalization to re-assess lesions noted to CT and PET scans.     INTERVAL HISTORY: She presents today for continued follow-up.  She was recently hospitalized from 09/20/17 through 09/23/17 for acute on chronic hypoxic respiratory failure.  She was treated with IV Solu-Medrol, duo nebs, and chest x-ray showed possible right-sided consolidation therefore she was empirically covered with Levaquin for possible pneumonia. She states now her breathing is back to her baseline of 3 L O2 nasal oxygen. She has been having right shoulder pain for the past 10 days and has been having pain lifting her arm above shoulder level. She denies any trauma to the right arm/shoulder area. She denies any chest pain, abdominal pain, N/V/D, headaches, dizziness. Appetite is stable.   REVIEW OF SYSTEMS:  Review of Systems  Constitutional: Negative for appetite change, chills, fatigue and fever.  HENT:   Negative for hearing loss, lump/mass, mouth sores, sore throat, tinnitus and trouble swallowing.   Eyes: Negative.  Negative for eye problems and icterus.  Respiratory: Positive for shortness of breath (chronic, stable at baseline). Negative for chest tightness, cough, hemoptysis and wheezing.   Cardiovascular: Negative.  Negative for chest pain, leg swelling and palpitations.  Gastrointestinal: Negative.  Negative for abdominal distention, abdominal pain, blood in stool, constipation, diarrhea, nausea and vomiting.  Endocrine: Negative.  Negative for hot flashes.  Genitourinary: Negative.  Negative for difficulty urinating, dysuria, frequency, hematuria and vaginal bleeding.   Musculoskeletal: Negative.  Negative for arthralgias and neck pain.  Skin: Negative.  Negative for itching and rash.  Neurological: Negative for dizziness, headaches and speech difficulty.  Hematological: Negative for adenopathy. Does not bruise/bleed easily.    Psychiatric/Behavioral: Negative.  Negative for confusion. The patient is not nervous/anxious.      PAST MEDICAL/SURGICAL HISTORY:  Past Medical History:  Diagnosis Date  . Adenocarcinoma of lung ([place])    Left lung 2009, resected  . Anginal pain ([place])   . Arthritis   . Back pain   . CHF (congestive heart failure) ([place])   . COPD (chronic obstructive pulmonary disease) ([place])   . Diverticulitis   . Dyslipidemia   . Essential hypertension   . H/O ventral hernia   . Non-obstructive CAD    a. 04/2015 NSTEMI/Cath: LAD 10p, LCX 83m RCA 36m20d, EF 35-40 w/ apical ballooning.  . On home O2    3L N/C  . Osteoporosis   . Osteoporosis 11/04/2015   Managed by Dr. FaLegrand Rams . Parkinson's disease ([place]  . Shortness of breath   . Takotsubo cardiomyopathy    a. 04/2015 Echo: EF 45-50%, mid-dist anterior/apical/inferoapical HK w/ hyperdynamic base. Gr 1 DD, mild AI, mild-mod MR, triv TR, PASP 4863m;  b. 04/2015 LV gram: Ef 35-40% w/ apical ballooning.  . Type II diabetes mellitus ([place] . Ventricular bigeminy    a. 04/2015 in setting of NSTEMI/Takotsubo.   Past Surgical History:  Procedure Laterality Date  . ABDOMINAL HYSTERECTOMY    . CARDIAC CATHETERIZATION N/A 04/17/2015   Procedure: Left Heart Cath and Coronary Angiography;  Surgeon: ThoTroy SineD;  Location: [facility];  Service: Cardiovascular;  Laterality: N/A;  . CHOLECYSTECTOMY    . COLONOSCOPY N/A 09/18/2014   Procedure: COLONOSCOPY;  Surgeon: SanDanie BinderD;  Location: [facility] ENDO SUITE;  Service: Endoscopy;  Laterality: N/A;  8:30 AM - moved to 10:30 - CRosendo Gros notify pt  . ECTOPIC PREGNANCY SURGERY    . INCISIONAL HERNIA REPAIR N/A 08/26/2013   Procedure: HERNIA REPAIR INCISIONAL WITH MESH;  Surgeon: MarJamesetta SoD;  Location: [facility] ORS;  Service: General;  Laterality: N/A;  . IR FLUORO GUIDE PORT INSERTION RIGHT  04/30/2017  . IR US [place] VASC ACCESS RIGHT  04/30/2017  . LUNG CANCER SURGERY    . VIDEO  BRONCHOSCOPY WITH ENDOBRONCHIAL NAVIGATION N/A 04/23/2017   Procedure: VIDEO BRONCHOSCOPY WITH ENDOBRONCHIAL NAVIGATION;  Surgeon: HenMelrose NakayamaD;  [facility];  Laterality: N/A;  . VIDEO BRONCHOSCOPY WITH ENDOBRONCHIAL ULTRASOUND N/A 04/23/2017   Procedure: VIDEO BRONCHOSCOPY WITH ENDOBRONCHIAL ULTRASOUND;  Surgeon: HenMelrose NakayamaD;  [facility];  Laterality: N/A;     SOCIAL HISTORY:  Social History   Social History  . Marital status: Widowed    Spouse name: N/A  . Number of children: N/A  . Years of education: N/A   Occupational History  . Not on file.   Social History Main Topics  . Smoking status: Former Smoker    Years: 20.00    Types: Cigarettes    Quit date: 08/13/2006  . Smokeless tobacco: Never Used  . Alcohol use No  . Drug use: No  . Sexual activity: Not Currently    Birth control/ protection: Surgical   Other Topics Concern  . Not on file   Social History Narrative  . No narrative on file    FAMILY HISTORY:  Family History  Problem Relation Age of Onset  .  Diabetes Mother   . Hypertension Mother   . Diabetes Father   . Asthma Unknown   . Cancer Unknown   . Heart attack Sister        X2  . Colon cancer Neg Hx     CURRENT MEDICATIONS:  Outpatient Encounter Prescriptions as of 10/09/2017  Medication Sig Note  . albuterol (PROAIR HFA) 108 (90 BASE) MCG/ACT inhaler Inhale 2 puffs into the lungs every 4 (four) hours as needed for wheezing or shortness of breath.   Marland Kitchen alendronate (FOSAMAX) 70 MG tablet Take 70 mg by mouth every Friday.    Marland Kitchen aspirin EC 81 MG tablet Take 81 mg by mouth daily.   Huey Bienenstock (TECENTRIQ IV) Inject into the vein. Every 3 weeks 09/20/2017: Scheduled for procedure next week  . atorvastatin (LIPITOR) 40 MG tablet Take 40 mg by mouth at bedtime.    Marland Kitchen buPROPion (WELLBUTRIN SR) 150 MG 12 hr tablet Take 150 mg by mouth 2 (two) times daily.   . cholecalciferol (VITAMIN  D) 1000 units tablet Take 1,000 Units by mouth daily.   . clotrimazole-betamethasone (LOTRISONE) cream Apply 1 application topically 2 (two) times daily.   . furosemide (LASIX) 40 MG tablet Take 1 tablet (40 mg total) by mouth daily. Pt is able to take one additional tablet daily for weight increases of 3lbs in a day or 5lbs in a week. Resume on 10/20   . gabapentin (NEURONTIN) 300 MG capsule Take 600 mg by mouth at bedtime.    . Garlic 756 MG TABS Take 300 mg by mouth daily.   Marland Kitchen guaiFENesin (MUCINEX) 600 MG 12 hr tablet Take 1 tablet (600 mg total) by mouth 2 (two) times daily.   Marland Kitchen ipratropium-albuterol (DUONEB) 0.5-2.5 (3) MG/3ML SOLN Inhale 3 mLs into the lungs every 6 (six) hours as needed (for wheezing/shortness of breath).    Marland Kitchen KLOR-CON 10 10 MEQ tablet TAKE 1 TABLET (10 MEQ TOTAL) BY MOUTH DAILY. *FURTHER REFILLS NEED TO BE AUTHORIZED BY PCP*   . LANTUS SOLOSTAR 100 UNIT/ML Solostar Pen Inject 15 Units into the skin at bedtime.    . lidocaine-prilocaine (EMLA) cream Apply to affected area once   . linagliptin (TRADJENTA) 5 MG TABS tablet Take 5 mg by mouth daily.   Marland Kitchen loratadine (CLARITIN) 10 MG tablet Take 10 mg by mouth daily.   . meclizine (ANTIVERT) 25 MG tablet Take 25 mg by mouth daily.    . metoprolol succinate (TOPROL-XL) 25 MG 24 hr tablet Take 1 tablet (25 mg total) by mouth daily.   . nitroGLYCERIN (NITROSTAT) 0.4 MG SL tablet Place 0.4 mg under the tongue every 5 (five) minutes as needed for chest pain.   . Omega-3 Fatty Acids (FISH OIL PO) Take 1 capsule by mouth daily.   Marland Kitchen omeprazole (PRILOSEC) 40 MG capsule Take 40 mg by mouth daily.   . ondansetron (ZOFRAN) 8 MG tablet Take 1 tablet (8 mg total) by mouth 2 (two) times daily as needed (Nausea or vomiting).   . OXYGEN Inhale 3 L into the lungs continuous.   . polyethylene glycol powder (GLYCOLAX/MIRALAX) powder Take 1 capful daily as needed. (Patient taking differently: Take 17 g by mouth daily as needed for mild constipation  or moderate constipation. Take 1 capful daily as needed.)   . predniSONE (DELTASONE) 10 MG tablet Take '40mg'$  po daily for 2 days then '30mg'$  daily for 2 days then '20mg'$  daily for 2 days then '10mg'$  daily for 2 days then stop   .  prochlorperazine (COMPAZINE) 10 MG tablet Take 1 tablet (10 mg total) by mouth every 6 (six) hours as needed (Nausea or vomiting).   . pyridoxine (B-6) 100 MG tablet Take 100 mg by mouth daily.    Marland Kitchen rOPINIRole (REQUIP) 0.5 MG tablet Take 0.5 mg by mouth 2 (two) times daily.    . sodium chloride (OCEAN) 0.65 % SOLN nasal spray Place 1 spray into both nostrils every 3 (three) hours as needed for congestion.    . traMADol (ULTRAM) 50 MG tablet Take 50 mg by mouth every 12 (twelve) hours as needed for moderate pain.    Marland Kitchen zolpidem (AMBIEN) 5 MG tablet Take 5 mg by mouth at bedtime as needed for sleep.   Marland Kitchen oxyCODONE (OXY IR/ROXICODONE) 5 MG immediate release tablet Take 1-2 tablets as needed every 4-6 hours for severe pain    No facility-administered encounter medications on file as of 10/09/2017.     ALLERGIES:  Allergies  Allergen Reactions  . Lisinopril Cough     PHYSICAL EXAM:  ECOG Performance status: 1-2  BP 156/59 P82 RR 18 T98.5 O2 sat 97%  Physical Exam  Constitutional: She is oriented to person, place, and time and well-developed, well-nourished, and in no distress. No distress.  oxygen dependent with portable oxygen   HENT:  Head: Normocephalic and atraumatic.  Mouth/Throat: Oropharynx is clear and moist. No oropharyngeal exudate.  Eyes: Pupils are equal, round, and reactive to light. Conjunctivae are normal. No scleral icterus.  Neck: Normal range of motion. Neck supple. No JVD present.  Cardiovascular: Normal rate, regular rhythm and normal heart sounds.  Exam reveals no gallop and no friction rub.   No murmur heard. Pulmonary/Chest: Effort normal. No respiratory distress. She has no wheezes. She has no rhonchi. She has no rales.  Abdominal: Soft. Bowel  sounds are normal. She exhibits no distension. There is no tenderness. There is no rebound and no guarding.  Musculoskeletal: Normal range of motion. She exhibits no edema or tenderness.  Lymphadenopathy:    She has no cervical adenopathy.  Neurological: She is alert and oriented to person, place, and time. No cranial nerve deficit.  Skin: Skin is warm and dry. No rash noted. No erythema. No pallor.  Psychiatric: Mood, memory, affect and judgment normal.  Nursing note and vitals reviewed.    LABORATORY DATA:  I have reviewed the labs as listed.  CBC    Component Value Date/Time   WBC 9.1 10/05/2017 1149   RBC 3.75 (L) 10/05/2017 1149   HGB 11.3 (L) 10/05/2017 1149   HCT 36.4 10/05/2017 1149   PLT 138 (L) 10/05/2017 1149   MCV 97.1 10/05/2017 1149   MCH 30.1 10/05/2017 1149   MCHC 31.0 10/05/2017 1149   RDW 16.1 (H) 10/05/2017 1149   LYMPHSABS 1.7 10/05/2017 1149   MONOABS 0.9 10/05/2017 1149   EOSABS 0.2 10/05/2017 1149   BASOSABS 0.0 10/05/2017 1149   CMP Latest Ref Rng & Units 10/05/2017 10/01/2017 09/22/2017  Glucose 65 - 99 mg/dL 183(H) 140(H) 213(H)  BUN 6 - 20 mg/dL 17 18 39(H)  Creatinine 0.44 - 1.00 mg/dL 1.19(H) 1.23(H) 1.89(H)  Sodium 135 - 145 mmol/L 139 136 135  Potassium 3.5 - 5.1 mmol/L 3.8 4.4 4.8  Chloride 101 - 111 mmol/L 105 101 97(L)  CO2 22 - 32 mmol/L '27 29 26  '$ Calcium 8.9 - 10.3 mg/dL 8.4(L) 8.8(L) 9.5  Total Protein 6.5 - 8.1 g/dL 5.7(L) 6.1(L) -  Total Bilirubin 0.3 - 1.2 mg/dL 0.5  0.4 -  Alkaline Phos 38 - 126 U/L 34(L) 42 -  AST 15 - 41 U/L 14(L) 16 -  ALT 14 - 54 U/L 16 19 -    PENDING LABS:    DIAGNOSTIC IMAGING:  PET scan: 01/21/17     CT chest: 03/25/17      PATHOLOGY:     ASSESSMENT & PLAN:  1.Cancer Staging Adenocarcinoma of lung [facility]) Staging form: Lung, AJCC 7th Edition - Clinical: Stage IA - Signed by Baird Cancer, PA-C on 02/27/2014 - Pathologic stage from 04/26/2017: Stage IV (T2b(2), N3, M1a) - Signed by  Baird Cancer, PA-C on 04/26/2017   PLAN: -Patient had restaging scans performed yesterday however it has not been read yet by the radiologist. I have told her that I will call her if there is any urgent abnormalities that require a change in her treatment plan, otherwise I will review her CT scans on her next treatment day on 10/22/17. -Next cycle of tecentriq on 10/22/17.  2. Right shoulder pain -Unclear the underlying cause. Offered patient to have a shoulder xray at this time, however she wants to hold off. -I have given her a Rx for roxicodone PRN for pain. I have told her if her symptoms do not improve by her next visit we will proceed with xray.  RTC on 10/22/17 for follow up and tecentriq treatment.  All questions were answered to patient's stated satisfaction. Encouraged patient to call with any new concerns or questions before her next visit to the cancer center and we can certain see her sooner, if needed.     Twana First, MD

## 2017-10-09 NOTE — Patient Instructions (Signed)
[place] at [facility] Discharge Instructions  RECOMMENDATIONS MADE BY THE CONSULTANT AND ANY TEST RESULTS WILL BE SENT TO YOUR REFERRING PHYSICIAN.  You were seen today by Dr. Twana First Follow up in 10 days for follow up and chemo  Thank you for choosing [place] at [facility] to provide your oncology and hematology care.  To afford each patient quality time with our provider, please arrive at least 15 minutes before your scheduled appointment time.    If you have a lab appointment with the [facility] please come in thru the  Main Entrance and check in at the main information desk  You need to re-schedule your appointment should you arrive 10 or more minutes late.  We strive to give you quality time with our providers, and arriving late affects you and other patients whose appointments are after yours.  Also, if you no show three or more times for appointments you may be dismissed from the clinic at the providers discretion.     Again, thank you for choosing [facility].  Our hope is that these requests will decrease the amount of time that you wait before being seen by our physicians.       _____________________________________________________________  Should you have questions after your visit to [facility], please contact our office at [phone] between the hours of 8:30 a.m. and 4:30 p.m.  Voicemails left after 4:30 p.m. will not be returned until the following business day.  For prescription refill requests, have your pharmacy contact our office.       Resources For Cancer Patients and their Caregivers ? American Cancer Society: Can assist with transportation, wigs, general needs, runs Look Good Feel Better.        585-833-8349 ? Cancer Care: Provides financial assistance, online support groups, medication/co-pay assistance.  1-800-813-HOPE 979-756-2989) ? [place] Assists [place] cancer patients and their families through emotional , educational and financial support.  (808) 044-0061 ? [place] DSS Where to apply for food stamps, Medicaid and utility assistance. 615-031-8546 ? [facility]: Transportation to medical appointments. 651-470-8061 ? Social Security Administration: May apply for disability if have a Stage IV cancer. 910-113-9744 216-549-6092 ? [employer], Disability and Transit Services: Assists with nutrition, care and transit needs. [place] Support Programs: @10RELATIVEDAYS @ > Cancer Support Group  2nd Tuesday of the month 1pm-2pm, Journey Room  > Creative Journey  3rd Tuesday of the month 1130am-1pm, Journey Room  > Look Good Feel Better  1st Wednesday of the month 10am-12 noon, Journey Room (Call [place] to register [phone])

## 2017-10-15 DIAGNOSIS — I10 Essential (primary) hypertension: Secondary | ICD-10-CM | POA: Diagnosis not present

## 2017-10-15 DIAGNOSIS — C3490 Malignant neoplasm of unspecified part of unspecified bronchus or lung: Secondary | ICD-10-CM | POA: Diagnosis not present

## 2017-10-15 DIAGNOSIS — M25511 Pain in right shoulder: Secondary | ICD-10-CM | POA: Diagnosis not present

## 2017-10-15 DIAGNOSIS — J449 Chronic obstructive pulmonary disease, unspecified: Secondary | ICD-10-CM | POA: Diagnosis not present

## 2017-10-15 DIAGNOSIS — E1165 Type 2 diabetes mellitus with hyperglycemia: Secondary | ICD-10-CM | POA: Diagnosis not present

## 2017-10-22 ENCOUNTER — Encounter (HOSPITAL_BASED_OUTPATIENT_CLINIC_OR_DEPARTMENT_OTHER): Payer: Medicare Other

## 2017-10-22 ENCOUNTER — Encounter (HOSPITAL_COMMUNITY): Payer: Self-pay | Admitting: Oncology

## 2017-10-22 ENCOUNTER — Other Ambulatory Visit (HOSPITAL_COMMUNITY): Payer: Medicare Other

## 2017-10-22 ENCOUNTER — Encounter (HOSPITAL_BASED_OUTPATIENT_CLINIC_OR_DEPARTMENT_OTHER): Payer: Medicare Other | Admitting: Oncology

## 2017-10-22 ENCOUNTER — Ambulatory Visit (HOSPITAL_COMMUNITY): Payer: Medicare Other

## 2017-10-22 ENCOUNTER — Other Ambulatory Visit: Payer: Self-pay

## 2017-10-22 VITALS — BP 115/52 | HR 64 | Temp 98.9°F | Resp 20

## 2017-10-22 VITALS — BP 140/61 | HR 104 | Temp 97.7°F | Resp 20 | Ht 64.0 in | Wt 180.0 lb

## 2017-10-22 DIAGNOSIS — C3412 Malignant neoplasm of upper lobe, left bronchus or lung: Secondary | ICD-10-CM | POA: Diagnosis not present

## 2017-10-22 DIAGNOSIS — C3431 Malignant neoplasm of lower lobe, right bronchus or lung: Secondary | ICD-10-CM | POA: Diagnosis not present

## 2017-10-22 DIAGNOSIS — C3492 Malignant neoplasm of unspecified part of left bronchus or lung: Secondary | ICD-10-CM | POA: Diagnosis present

## 2017-10-22 DIAGNOSIS — M818 Other osteoporosis without current pathological fracture: Secondary | ICD-10-CM

## 2017-10-22 DIAGNOSIS — Z5112 Encounter for antineoplastic immunotherapy: Secondary | ICD-10-CM | POA: Diagnosis not present

## 2017-10-22 DIAGNOSIS — C3490 Malignant neoplasm of unspecified part of unspecified bronchus or lung: Secondary | ICD-10-CM

## 2017-10-22 DIAGNOSIS — Z7189 Other specified counseling: Secondary | ICD-10-CM

## 2017-10-22 LAB — CBC WITH DIFFERENTIAL/PLATELET
Basophils Absolute: 0 10*3/uL (ref 0.0–0.1)
Basophils Relative: 0 %
Eosinophils Absolute: 0.2 10*3/uL (ref 0.0–0.7)
Eosinophils Relative: 3 %
HCT: 39.9 % (ref 36.0–46.0)
Hemoglobin: 12.4 g/dL (ref 12.0–15.0)
Lymphocytes Relative: 16 %
Lymphs Abs: 1.2 10*3/uL (ref 0.7–4.0)
MCH: 30.1 pg (ref 26.0–34.0)
MCHC: 31.1 g/dL (ref 30.0–36.0)
MCV: 96.8 fL (ref 78.0–100.0)
Monocytes Absolute: 0.7 10*3/uL (ref 0.1–1.0)
Monocytes Relative: 9 %
Neutro Abs: 5 10*3/uL (ref 1.7–7.7)
Neutrophils Relative %: 72 %
Platelets: 303 10*3/uL (ref 150–400)
RBC: 4.12 MIL/uL (ref 3.87–5.11)
RDW: 15.4 % (ref 11.5–15.5)
WBC: 7 10*3/uL (ref 4.0–10.5)

## 2017-10-22 LAB — COMPREHENSIVE METABOLIC PANEL
ALT: 21 U/L (ref 14–54)
AST: 28 U/L (ref 15–41)
Albumin: 4 g/dL (ref 3.5–5.0)
Alkaline Phosphatase: 59 U/L (ref 38–126)
Anion gap: 10 (ref 5–15)
BUN: 12 mg/dL (ref 6–20)
CO2: 29 mmol/L (ref 22–32)
Calcium: 9.5 mg/dL (ref 8.9–10.3)
Chloride: 97 mmol/L — ABNORMAL LOW (ref 101–111)
Creatinine, Ser: 1.46 mg/dL — ABNORMAL HIGH (ref 0.44–1.00)
GFR calc Af Amer: 38 mL/min — ABNORMAL LOW (ref >60)
GFR calc non Af Amer: 33 mL/min — ABNORMAL LOW (ref >60)
Glucose, Bld: 191 mg/dL — ABNORMAL HIGH (ref 65–99)
Potassium: 4 mmol/L (ref 3.5–5.1)
Sodium: 136 mmol/L (ref 135–145)
Total Bilirubin: 0.6 mg/dL (ref 0.3–1.2)
Total Protein: 7.4 g/dL (ref 6.5–8.1)

## 2017-10-22 MED ORDER — SODIUM CHLORIDE 0.9 % IV SOLN
Freq: Once | INTRAVENOUS | Status: AC
  Administered 2017-10-22: 13:00:00 via INTRAVENOUS

## 2017-10-22 MED ORDER — HEPARIN SOD (PORK) LOCK FLUSH 100 UNIT/ML IV SOLN
500.0000 [IU] | Freq: Once | INTRAVENOUS | Status: AC | PRN
  Administered 2017-10-22: 500 [IU]
  Filled 2017-10-22: qty 5

## 2017-10-22 MED ORDER — SODIUM CHLORIDE 0.9 % IV SOLN
1200.0000 mg | Freq: Once | INTRAVENOUS | Status: AC
  Administered 2017-10-22: 1200 mg via INTRAVENOUS
  Filled 2017-10-22: qty 20

## 2017-10-22 MED ORDER — PEGFILGRASTIM 6 MG/0.6ML ~~LOC~~ PSKT
PREFILLED_SYRINGE | SUBCUTANEOUS | Status: AC
  Filled 2017-10-22: qty 0.6

## 2017-10-22 NOTE — Progress Notes (Signed)
[address] [place], [place] [id number]   CLINIC:  Medical Oncology/Hematology  PCP:  Rosita Fire, MD [place] [address] [phone]   REASON FOR VISIT:  New/recurrent adenocarcinoma of the left and right lung; h/o Stage IA NSCLC s/p resection in 2009.   CURRENT THERAPY: Initial work-up    BRIEF ONCOLOGIC HISTORY:    Adenocarcinoma of lung ([place])   07/07/2008 Surgery    Performed in [place], [place].  Consistent with poorly differentiated adenocarcinoma.      02/27/2014 Imaging    CT of chest demonstrating a RLL solid nodular component measuring 9 mm at the site of previous ground-glass opacity. Slight increase in size of an anterior mediastinal node measuring 1.1 cm with stable 1 cm pretracheal lymph node. Consider PET      03/20/2014 Imaging    PET scan- No hypermetabolic pulmonary lesions to suggest recurrent or metastatic pulmonary disease.  Weakly positive left internal mammary lymph node and left axillary lymph node (? inflammatory process involving the left breast).      11/07/2015 Imaging    CT chest- 1. No evidence of metastatic involvement of the lungs. The previously noted small noncalcified lung nodules are stable. 2. Vague focal opacity in the right lower lobe may represent scarring and is stable. Continued followup however is recommended in 1 year.      01/08/2017 Imaging    CT chest- 1. New nodules along a thick walled bullous lesion in the right lower lobe, suspicious for malignancy. 2. New fairly large region of confluent ground-glass opacity posteriorly in the remaining left lung, low grade adenocarcinoma is not excluded and there is mild new nodularity along the upper margin of this process. 3. Stable biapical pleuroparenchymal scarring with several stable nodules in the left lung. 4. Borderline adenopathy in the mediastinum, essentially stable from prior. 5. Coronary, aortic arch, and branch  vessel atherosclerotic vascular disease. 6. Small hiatal hernia. 7. Hypodense left thyroid lesion. If not previously worked up, thyroid ultrasound could be employed.      01/21/2017 PET scan    1. New hypermetabolic adenopathy in the mediastinum and hila, with progressive hypermetabolic ground-glass density posteriorly in the left lower lobe with faint nodular components, and some low-grade hypermetabolic activity in ground-glass opacity dependently in the right upper lobe. Although the appearance is concerning for malignancy potentially with a lymphangitic component in the left lower lobe, the unusual configuration in the appearance of the ground-glass opacities could conceivably represent an atypical granulomatous infectious process as an alternative. This is not classic obvious recurrence with well-defined mass or nodules. 2. There is some ill-defined clustered nodularity in the right lower lobe only faintly metabolic, maximum SUV 3.0. 3. Overall I believe that this process require surveillance to differentiate an inflammatory/infectious condition from recurrent malignancy. The adenopathy is certainly concerning. 4. No findings of malignancy in the neck, abdomen/pelvis, or skeleton. 5. Sigmoid colon diverticulosis. 6. Emphysema. 7. Chronic ethmoid sinusitis.      03/25/2017 Imaging    CT chest- Stable 5.9 cm left lower lobe ground-glass opacity, hypermetabolic on prior PET, worrisome for primary bronchogenic neoplasm such as low-grade adenocarcinoma.  Stable 2.2 cm cavitary lesion in the central right lower lobe with surrounding hypermetabolic nodularity measuring up to 1.8 cm, worrisome for primary bronchogenic neoplasm such as squamous cell carcinoma.  Hypermetabolic mediastinal lymph nodes measuring up to 11 mm short axis, as above. Bilateral hilar regions are hypermetabolic on PET but poorly evaluated on  unenhanced CT.  Emphysema and aortic atherosclerosis.       04/23/2017 Procedure    Bronchoscopy and EBUS with mediastinal lymph node aspirations by Dr. Roxan Hockey.      04/26/2017 Pathology Results    1. Lung, biopsy, Left upper lobe - ADENOCARCINOMA. - SEE COMMENT. 2. Lung, biopsy, Right lower lobe - ADENOCARCINOMA. - SEE COMMENT. 3. Lung, biopsy, Right lower lobe target 2 - ATYPICAL CELLS. - SEE COMMENT.      04/26/2017 Relapse/Recurrence         05/14/2017 Pathology Results    PDL1 NEGATIVE.  TPS 0%.      05/19/2017 Pathology Results    [employer] testing- MS-stable.  TMB 6 Muts/Mb (Intermediate).  STK11 E293*.  Negative for EGFR, KRAS, ALK, BRAF, MET, RET, ERBB2, and ROS1.  Potential treatment options: Atezolizumab/Durvalumab/Nivolumab/Pembrolizumab for TMB score.      07/27/2017 Imaging    CT C/A/P: IMPRESSION: Resolution of right lower lobe lesion and nodules.  Unchanged ill-defined nonspecific left lower lobe ground-glass opacity and stable mildly prominent mediastinal lymph nodes.  No evidence of metastatic disease or acute abnormality within the abdomen or pelvis.  Aortic Atherosclerosis (ICD10-I70.0) and Emphysema (ICD10-J43.9).      10/08/2017 Imaging    CT C/A/P: Similar appearance of the chest with an upper normal sized paratracheal lymph node, confluent ground-glass opacity in the left lower lobe, and severe centrilobular emphysema, but no findings of progressive or definite active malignancy.        HISTORY OF PRESENT ILLNESS:  (From Kirby Crigler, PA-C's last note on 12/05/16)   On 01/07/17, she had CT chest for surveillance of her lung cancer; new nodules were noted and concerning for malignancy.  She was recently admitted on 01/10/17-01/13/17 for COPD exacerbation; she was discharged on oral steroids at that time.  On 01/21/17, she underwent PET scan revealing new hypermetabolic adenopathy in the mediastinum and hilum, with progressive hypermetabolic groundglass density in the left lower lobe; there is mention of  possible concern of malignancy with a lymphangitic component in the left lower lobe, but this may also represent an atypical granulomatous infectious process as well; there were no findings of malignancy in the neck, abdomen/pelvis, or skeleton. It was recommended by cardiothoracic surgery that we repeat imaging in 2-3 months after hospitalization to re-assess lesions noted to CT and PET scans.     INTERVAL HISTORY: She presents today for continued follow-up.  Patient states that she has been feeling more short of breath lately, and thinks that it may be due to all the rainy weather we have been having.  She has been using her nebulizer treatments at home. She denies any chest pain, abdominal pain, N/V/D, headaches, dizziness. Appetite is stable.   REVIEW OF SYSTEMS:  Review of Systems  Constitutional: Negative for appetite change, chills, fatigue and fever.  HENT:   Negative for hearing loss, lump/mass, mouth sores, sore throat, tinnitus and trouble swallowing.   Eyes: Negative.  Negative for eye problems and icterus.  Respiratory: Positive for shortness of breath (chronic). Negative for chest tightness, cough, hemoptysis and wheezing.   Cardiovascular: Negative.  Negative for chest pain, leg swelling and palpitations.  Gastrointestinal: Negative.  Negative for abdominal distention, abdominal pain, blood in stool, constipation, diarrhea, nausea and vomiting.  Endocrine: Negative.  Negative for hot flashes.  Genitourinary: Negative.  Negative for difficulty urinating, dysuria, frequency, hematuria and vaginal bleeding.   Musculoskeletal: Negative.  Negative for arthralgias and neck pain.  Skin: Negative.  Negative for itching and  rash.  Neurological: Negative for dizziness, headaches and speech difficulty.  Hematological: Negative for adenopathy. Does not bruise/bleed easily.  Psychiatric/Behavioral: Negative.  Negative for confusion. The patient is not nervous/anxious.      PAST  MEDICAL/SURGICAL HISTORY:  Past Medical History:  Diagnosis Date  . Adenocarcinoma of lung ([place])    Left lung 2009, resected  . Anginal pain ([place])   . Arthritis   . Back pain   . CHF (congestive heart failure) ([place])   . COPD (chronic obstructive pulmonary disease) ([facility])   . Diverticulitis   . Dyslipidemia   . Essential hypertension   . H/O ventral hernia   . Non-obstructive CAD    a. 04/2015 NSTEMI/Cath: LAD 10p, LCX 49m RCA 388m20d, EF 35-40 w/ apical ballooning.  . On home O2    3L N/C  . Osteoporosis   . Osteoporosis 11/04/2015   Managed by Dr. FaLegrand Rams . Parkinson's disease ([facility]  . Shortness of breath   . Takotsubo cardiomyopathy    a. 04/2015 Echo: EF 45-50%, mid-dist anterior/apical/inferoapical HK w/ hyperdynamic base. Gr 1 DD, mild AI, mild-mod MR, triv TR, PASP 4829m;  b. 04/2015 LV gram: Ef 35-40% w/ apical ballooning.  . Type II diabetes mellitus ([facility] . Ventricular bigeminy    a. 04/2015 in setting of NSTEMI/Takotsubo.   Past Surgical History:  Procedure Laterality Date  . ABDOMINAL HYSTERECTOMY    . CARDIAC CATHETERIZATION N/A 04/17/2015   Procedure: Left Heart Cath and Coronary Angiography;  Surgeon: ThoTroy SineD;  Location: [facility];  Service: Cardiovascular;  Laterality: N/A;  . CHOLECYSTECTOMY    . COLONOSCOPY N/A 09/18/2014   Procedure: COLONOSCOPY;  Surgeon: SanDanie BinderD;  Location: [facility];  Service: Endoscopy;  Laterality: N/A;  8:30 AM - moved to 10:30 - CRosendo Gros notify pt  . ECTOPIC PREGNANCY SURGERY    . INCISIONAL HERNIA REPAIR N/A 08/26/2013   Procedure: HERNIA REPAIR INCISIONAL WITH MESH;  Surgeon: MarJamesetta SoD;  Location: [facility];  Service: General;  Laterality: N/A;  . IR FLUORO GUIDE PORT INSERTION RIGHT  04/30/2017  . IR US [place] VASC ACCESS RIGHT  04/30/2017  . LUNG CANCER SURGERY    . VIDEO BRONCHOSCOPY WITH ENDOBRONCHIAL NAVIGATION N/A 04/23/2017   Procedure: VIDEO BRONCHOSCOPY WITH ENDOBRONCHIAL NAVIGATION;   Surgeon: HenMelrose NakayamaD;  [facility];  Laterality: N/A;  . VIDEO BRONCHOSCOPY WITH ENDOBRONCHIAL ULTRASOUND N/A 04/23/2017   Procedure: VIDEO BRONCHOSCOPY WITH ENDOBRONCHIAL ULTRASOUND;  Surgeon: HenMelrose NakayamaD;  Location: [facility];  Service: Thoracic;  Laterality: N/A;     SOCIAL HISTORY:  Social History   Socioeconomic History  . Marital status: Widowed    Spouse name: Not on file  . Number of children: Not on file  . Years of education: Not on file  . Highest education level: Not on file  Social Needs  . Financial resource strain: Not on file  . Food insecurity - worry: Not on file  . Food insecurity - inability: Not on file  . Transportation needs - medical: Not on file  . Transportation needs - non-medical: Not on file  Occupational History  . Not on file  Tobacco Use  . Smoking status: Former Smoker    Years: 20.00    Types: Cigarettes    Last attempt to quit: 08/13/2006    Years since quitting: 11.2  . Smokeless tobacco: Never Used  Substance  and Sexual Activity  . Alcohol use: No    Alcohol/week: 0.0 oz  . Drug use: No  . Sexual activity: Not Currently    Birth control/protection: Surgical  Other Topics Concern  . Not on file  Social History Narrative  . Not on file    FAMILY HISTORY:  Family History  Problem Relation Age of Onset  . Diabetes Mother   . Hypertension Mother   . Diabetes Father   . Asthma Unknown   . Cancer Unknown   . Heart attack Sister        X2  . Colon cancer Neg Hx     CURRENT MEDICATIONS:  Outpatient Encounter Medications as of 10/22/2017  Medication Sig Note  . albuterol (PROAIR HFA) 108 (90 BASE) MCG/ACT inhaler Inhale 2 puffs into the lungs every 4 (four) hours as needed for wheezing or shortness of breath.   Marland Kitchen alendronate (FOSAMAX) 70 MG tablet Take 70 mg by mouth every Friday.    Marland Kitchen aspirin EC 81 MG tablet Take 81 mg by mouth daily.   Huey Bienenstock (TECENTRIQ IV) Inject into the  vein. Every 3 weeks 09/20/2017: Scheduled for procedure next week  . atorvastatin (LIPITOR) 40 MG tablet Take 40 mg by mouth at bedtime.    Marland Kitchen buPROPion (WELLBUTRIN SR) 150 MG 12 hr tablet Take 150 mg by mouth 2 (two) times daily.   . cholecalciferol (VITAMIN D) 1000 units tablet Take 1,000 Units by mouth daily.   . clotrimazole-betamethasone (LOTRISONE) cream Apply 1 application topically 2 (two) times daily.   . furosemide (LASIX) 40 MG tablet Take 1 tablet (40 mg total) by mouth daily. Pt is able to take one additional tablet daily for weight increases of 3lbs in a day or 5lbs in a week. Resume on 10/20   . gabapentin (NEURONTIN) 300 MG capsule Take 600 mg by mouth at bedtime.    . Garlic 616 MG TABS Take 300 mg by mouth daily.   Marland Kitchen guaiFENesin (MUCINEX) 600 MG 12 hr tablet Take 1 tablet (600 mg total) by mouth 2 (two) times daily.   Marland Kitchen ipratropium-albuterol (DUONEB) 0.5-2.5 (3) MG/3ML SOLN Inhale 3 mLs into the lungs every 6 (six) hours as needed (for wheezing/shortness of breath).    Marland Kitchen KLOR-CON 10 10 MEQ tablet TAKE 1 TABLET (10 MEQ TOTAL) BY MOUTH DAILY. *FURTHER REFILLS NEED TO BE AUTHORIZED BY PCP*   . LANTUS SOLOSTAR 100 UNIT/ML Solostar Pen Inject 15 Units into the skin at bedtime.    . lidocaine-prilocaine (EMLA) cream Apply to affected area once   . linagliptin (TRADJENTA) 5 MG TABS tablet Take 5 mg by mouth daily.   Marland Kitchen loratadine (CLARITIN) 10 MG tablet Take 10 mg by mouth daily.   . meclizine (ANTIVERT) 25 MG tablet Take 25 mg by mouth daily.    . metoprolol succinate (TOPROL-XL) 25 MG 24 hr tablet Take 1 tablet (25 mg total) by mouth daily.   . nitroGLYCERIN (NITROSTAT) 0.4 MG SL tablet Place 0.4 mg under the tongue every 5 (five) minutes as needed for chest pain.   . Omega-3 Fatty Acids (FISH OIL PO) Take 1 capsule by mouth daily.   Marland Kitchen omeprazole (PRILOSEC) 40 MG capsule Take 40 mg by mouth daily.   . ondansetron (ZOFRAN) 8 MG tablet Take 1 tablet (8 mg total) by mouth 2 (two) times  daily as needed (Nausea or vomiting).   Marland Kitchen oxyCODONE (OXY IR/ROXICODONE) 5 MG immediate release tablet Take 1-2 tablets as needed every 4-6  hours for severe pain   . OXYGEN Inhale 3 L into the lungs continuous.   . polyethylene glycol powder (GLYCOLAX/MIRALAX) powder Take 1 capful daily as needed. (Patient taking differently: Take 17 g by mouth daily as needed for mild constipation or moderate constipation. Take 1 capful daily as needed.)   . predniSONE (DELTASONE) 10 MG tablet Take '40mg'$  po daily for 2 days then '30mg'$  daily for 2 days then '20mg'$  daily for 2 days then '10mg'$  daily for 2 days then stop   . prochlorperazine (COMPAZINE) 10 MG tablet Take 1 tablet (10 mg total) by mouth every 6 (six) hours as needed (Nausea or vomiting).   . pyridoxine (B-6) 100 MG tablet Take 100 mg by mouth daily.    Marland Kitchen rOPINIRole (REQUIP) 0.5 MG tablet Take 0.5 mg by mouth 2 (two) times daily.    . sodium chloride (OCEAN) 0.65 % SOLN nasal spray Place 1 spray into both nostrils every 3 (three) hours as needed for congestion.    . traMADol (ULTRAM) 50 MG tablet Take 50 mg by mouth every 12 (twelve) hours as needed for moderate pain.    Marland Kitchen zolpidem (AMBIEN) 5 MG tablet Take 5 mg by mouth at bedtime as needed for sleep.    No facility-administered encounter medications on file as of 10/22/2017.     ALLERGIES:  Allergies  Allergen Reactions  . Lisinopril Cough     PHYSICAL EXAM:  ECOG Performance status: 1-2  BP 156/59 P82 RR 18 T98.5 O2 sat 97%  Physical Exam  Constitutional: She is oriented to person, place, and time and well-developed, well-nourished, and in no distress. No distress.  oxygen dependent with portable oxygen   HENT:  Head: Normocephalic and atraumatic.  Mouth/Throat: Oropharynx is clear and moist. No oropharyngeal exudate.  Eyes: Conjunctivae are normal. Pupils are equal, round, and reactive to light. No scleral icterus.  Neck: Normal range of motion. Neck supple. No JVD present.    Cardiovascular: Normal rate, regular rhythm and normal heart sounds. Exam reveals no gallop and no friction rub.  No murmur heard. Pulmonary/Chest: Effort normal. No respiratory distress. She has no wheezes. She has no rhonchi. She has no rales.  Abdominal: Soft. Bowel sounds are normal. She exhibits no distension. There is no tenderness. There is no rebound and no guarding.  Musculoskeletal: Normal range of motion. She exhibits no edema or tenderness.  Lymphadenopathy:    She has no cervical adenopathy.  Neurological: She is alert and oriented to person, place, and time. No cranial nerve deficit.  Skin: Skin is warm and dry. No rash noted. No erythema. No pallor.  Psychiatric: Mood, memory, affect and judgment normal.  Nursing note and vitals reviewed.    LABORATORY DATA:  I have reviewed the labs as listed.  CBC    Component Value Date/Time   WBC 9.1 10/05/2017 1149   RBC 3.75 (L) 10/05/2017 1149   HGB 11.3 (L) 10/05/2017 1149   HCT 36.4 10/05/2017 1149   PLT 138 (L) 10/05/2017 1149   MCV 97.1 10/05/2017 1149   MCH 30.1 10/05/2017 1149   MCHC 31.0 10/05/2017 1149   RDW 16.1 (H) 10/05/2017 1149   LYMPHSABS 1.7 10/05/2017 1149   MONOABS 0.9 10/05/2017 1149   EOSABS 0.2 10/05/2017 1149   BASOSABS 0.0 10/05/2017 1149   CMP Latest Ref Rng & Units 10/05/2017 10/01/2017 09/22/2017  Glucose 65 - 99 mg/dL 183(H) 140(H) 213(H)  BUN 6 - 20 mg/dL 17 18 39(H)  Creatinine 0.44 - 1.00  mg/dL 1.19(H) 1.23(H) 1.89(H)  Sodium 135 - 145 mmol/L 139 136 135  Potassium 3.5 - 5.1 mmol/L 3.8 4.4 4.8  Chloride 101 - 111 mmol/L 105 101 97(L)  CO2 22 - 32 mmol/L '27 29 26  '$ Calcium 8.9 - 10.3 mg/dL 8.4(L) 8.8(L) 9.5  Total Protein 6.5 - 8.1 g/dL 5.7(L) 6.1(L) -  Total Bilirubin 0.3 - 1.2 mg/dL 0.5 0.4 -  Alkaline Phos 38 - 126 U/L 34(L) 42 -  AST 15 - 41 U/L 14(L) 16 -  ALT 14 - 54 U/L 16 19 -    PENDING LABS:    DIAGNOSTIC IMAGING:  PET scan: 01/21/17     CT chest:  03/25/17      PATHOLOGY:     ASSESSMENT & PLAN:  1.Cancer Staging Adenocarcinoma of lung [facility]) Staging form: Lung, AJCC 7th Edition - Clinical: Stage IA - Signed by Baird Cancer, PA-C on 02/27/2014 - Pathologic stage from 04/26/2017: Stage IV (T2b(2), N3, M1a) - Signed by Baird Cancer, PA-C on 04/26/2017   PLAN: -Reviewed her restaging CT C/A/P from 10/09/17 in detail with the patient. She has no new disease or evidence of active disease. Restage in 3 months. -Next cycle of tecentriq today. Continue q3 week tecentriq treatments; she is tolerating them very well without side effects.   RTC in 3 weeks for follow up and tecentriq treatment.  All questions were answered to patient's stated satisfaction. Encouraged patient to call with any new concerns or questions before her next visit to the cancer center and we can certain see her sooner, if needed.     Twana First, MD

## 2017-10-22 NOTE — Progress Notes (Signed)
Addendum to previous note - discharged via wheelchair.

## 2017-10-22 NOTE — Progress Notes (Signed)
Tolerated infusion w/o adverse reaction.  Alert, in no distress.  VSS.  Discharged ambulatory.  

## 2017-10-25 DIAGNOSIS — J449 Chronic obstructive pulmonary disease, unspecified: Secondary | ICD-10-CM | POA: Diagnosis not present

## 2017-11-03 NOTE — Addendum Note (Signed)
Encounter addended by: Gaetana Michaelis B on: 11/03/2017 11:06 AM  Actions taken: Imaging Exam ended

## 2017-11-03 NOTE — Addendum Note (Signed)
Encounter addended by: Annie Paras on: 11/03/2017 9:46 AM  Actions taken: Imaging Exam ended

## 2017-11-12 ENCOUNTER — Other Ambulatory Visit (HOSPITAL_COMMUNITY): Payer: Self-pay | Admitting: Oncology

## 2017-11-12 ENCOUNTER — Other Ambulatory Visit (HOSPITAL_COMMUNITY): Payer: Medicare Other

## 2017-11-12 ENCOUNTER — Encounter (HOSPITAL_COMMUNITY): Payer: Medicare Other

## 2017-11-12 ENCOUNTER — Other Ambulatory Visit: Payer: Self-pay

## 2017-11-12 ENCOUNTER — Ambulatory Visit (HOSPITAL_COMMUNITY)
Admission: RE | Admit: 2017-11-12 | Discharge: 2017-11-12 | Disposition: A | Discharge status: Home / Self Care | Payer: Medicare Other | Source: Ambulatory Visit | Attending: Oncology | Admitting: Oncology

## 2017-11-12 ENCOUNTER — Encounter (HOSPITAL_COMMUNITY): Payer: Self-pay

## 2017-11-12 ENCOUNTER — Encounter (HOSPITAL_COMMUNITY): Payer: Medicare Other | Attending: Oncology

## 2017-11-12 ENCOUNTER — Encounter (HOSPITAL_BASED_OUTPATIENT_CLINIC_OR_DEPARTMENT_OTHER): Payer: Medicare Other | Admitting: Oncology

## 2017-11-12 ENCOUNTER — Ambulatory Visit (HOSPITAL_COMMUNITY): Payer: Medicare Other

## 2017-11-12 ENCOUNTER — Encounter (HOSPITAL_COMMUNITY): Payer: Self-pay | Admitting: Oncology

## 2017-11-12 VITALS — BP 138/64 | HR 75 | Temp 97.9°F | Resp 18 | Wt 177.0 lb

## 2017-11-12 DIAGNOSIS — C3412 Malignant neoplasm of upper lobe, left bronchus or lung: Secondary | ICD-10-CM

## 2017-11-12 DIAGNOSIS — C3490 Malignant neoplasm of unspecified part of unspecified bronchus or lung: Secondary | ICD-10-CM

## 2017-11-12 DIAGNOSIS — R221 Localized swelling, mass and lump, neck: Secondary | ICD-10-CM | POA: Insufficient documentation

## 2017-11-12 DIAGNOSIS — Z5112 Encounter for antineoplastic immunotherapy: Secondary | ICD-10-CM

## 2017-11-12 DIAGNOSIS — C3431 Malignant neoplasm of lower lobe, right bronchus or lung: Secondary | ICD-10-CM | POA: Diagnosis not present

## 2017-11-12 DIAGNOSIS — Z7189 Other specified counseling: Secondary | ICD-10-CM

## 2017-11-12 DIAGNOSIS — I700 Atherosclerosis of aorta: Secondary | ICD-10-CM | POA: Diagnosis not present

## 2017-11-12 LAB — CBC WITH DIFFERENTIAL/PLATELET
Basophils Absolute: 0 10*3/uL (ref 0.0–0.1)
Basophils Relative: 0 %
Eosinophils Absolute: 0.2 10*3/uL (ref 0.0–0.7)
Eosinophils Relative: 2 %
HCT: 35.9 % — ABNORMAL LOW (ref 36.0–46.0)
Hemoglobin: 11.1 g/dL — ABNORMAL LOW (ref 12.0–15.0)
Lymphocytes Relative: 17 %
Lymphs Abs: 1.7 10*3/uL (ref 0.7–4.0)
MCH: 29.3 pg (ref 26.0–34.0)
MCHC: 30.9 g/dL (ref 30.0–36.0)
MCV: 94.7 fL (ref 78.0–100.0)
Monocytes Absolute: 1 10*3/uL (ref 0.1–1.0)
Monocytes Relative: 10 %
Neutro Abs: 7 10*3/uL (ref 1.7–7.7)
Neutrophils Relative %: 71 %
Platelets: 218 10*3/uL (ref 150–400)
RBC: 3.79 MIL/uL — ABNORMAL LOW (ref 3.87–5.11)
RDW: 15.5 % (ref 11.5–15.5)
WBC: 9.9 10*3/uL (ref 4.0–10.5)

## 2017-11-12 LAB — COMPREHENSIVE METABOLIC PANEL
ALT: 21 U/L (ref 14–54)
AST: 23 U/L (ref 15–41)
Albumin: 3.4 g/dL — ABNORMAL LOW (ref 3.5–5.0)
Alkaline Phosphatase: 54 U/L (ref 38–126)
Anion gap: 8 (ref 5–15)
BUN: 21 mg/dL — ABNORMAL HIGH (ref 6–20)
CO2: 27 mmol/L (ref 22–32)
Calcium: 9.3 mg/dL (ref 8.9–10.3)
Chloride: 100 mmol/L — ABNORMAL LOW (ref 101–111)
Creatinine, Ser: 2 mg/dL — ABNORMAL HIGH (ref 0.44–1.00)
GFR calc Af Amer: 26 mL/min — ABNORMAL LOW (ref >60)
GFR calc non Af Amer: 22 mL/min — ABNORMAL LOW (ref >60)
Glucose, Bld: 176 mg/dL — ABNORMAL HIGH (ref 65–99)
Potassium: 3.9 mmol/L (ref 3.5–5.1)
Sodium: 135 mmol/L (ref 135–145)
Total Bilirubin: 0.4 mg/dL (ref 0.3–1.2)
Total Protein: 6.7 g/dL (ref 6.5–8.1)

## 2017-11-12 LAB — TSH: TSH: 1.332 u[IU]/mL (ref 0.350–4.500)

## 2017-11-12 MED ORDER — SODIUM CHLORIDE 0.9 % IV SOLN
Freq: Once | INTRAVENOUS | Status: AC
  Administered 2017-11-12: 12:00:00 via INTRAVENOUS

## 2017-11-12 MED ORDER — SODIUM CHLORIDE 0.9 % IV SOLN
Freq: Once | INTRAVENOUS | Status: AC
  Administered 2017-11-12: 11:00:00 via INTRAVENOUS

## 2017-11-12 MED ORDER — SODIUM CHLORIDE 0.9 % IV SOLN
1200.0000 mg | Freq: Once | INTRAVENOUS | Status: AC
  Administered 2017-11-12: 1200 mg via INTRAVENOUS
  Filled 2017-11-12: qty 20

## 2017-11-12 MED ORDER — HEPARIN SOD (PORK) LOCK FLUSH 100 UNIT/ML IV SOLN
500.0000 [IU] | Freq: Once | INTRAVENOUS | Status: AC | PRN
  Administered 2017-11-12: 500 [IU]

## 2017-11-12 MED ORDER — SODIUM CHLORIDE 0.9% FLUSH
10.0000 mL | INTRAVENOUS | Status: DC | PRN
  Administered 2017-11-12: 10 mL
  Filled 2017-11-12: qty 10

## 2017-11-12 NOTE — Progress Notes (Signed)
Angela Lawson, [place] [id number]   CLINIC:  Medical Oncology/Hematology  PCP:  Rosita Fire, MD [place] [address] [phone]   REASON FOR VISIT:  New/recurrent adenocarcinoma of the left and right lung; h/o Stage IA NSCLC s/p resection in 2009.   CURRENT THERAPY: Initial work-up    BRIEF ONCOLOGIC HISTORY:    Adenocarcinoma of lung ([place])   07/07/2008 Surgery    Performed in [place], [place].  Consistent with poorly differentiated adenocarcinoma.      02/27/2014 Imaging    CT of chest demonstrating a RLL solid nodular component measuring 9 mm at the site of previous ground-glass opacity. Slight increase in size of an anterior mediastinal node measuring 1.1 cm with stable 1 cm pretracheal lymph node. Consider PET      03/20/2014 Imaging    PET scan- No hypermetabolic pulmonary lesions to suggest recurrent or metastatic pulmonary disease.  Weakly positive left internal mammary lymph node and left axillary lymph node (? inflammatory process involving the left breast).      11/07/2015 Imaging    CT chest- 1. No evidence of metastatic involvement of the lungs. The previously noted small noncalcified lung nodules are stable. 2. Vague focal opacity in the right lower lobe may represent scarring and is stable. Continued followup however is recommended in 1 year.      01/08/2017 Imaging    CT chest- 1. New nodules along a thick walled bullous lesion in the right lower lobe, suspicious for malignancy. 2. New fairly large region of confluent ground-glass opacity posteriorly in the remaining left lung, low grade adenocarcinoma is not excluded and there is mild new nodularity along the upper margin of this process. 3. Stable biapical pleuroparenchymal scarring with several stable nodules in the left lung. 4. Borderline adenopathy in the mediastinum, essentially stable from prior. 5. Coronary, aortic arch, and branch  vessel atherosclerotic vascular disease. 6. Small hiatal hernia. 7. Hypodense left thyroid lesion. If not previously worked up, thyroid ultrasound could be employed.      01/21/2017 PET scan    1. New hypermetabolic adenopathy in the mediastinum and hila, with progressive hypermetabolic ground-glass density posteriorly in the left lower lobe with faint nodular components, and some low-grade hypermetabolic activity in ground-glass opacity dependently in the right upper lobe. Although the appearance is concerning for malignancy potentially with a lymphangitic component in the left lower lobe, the unusual configuration in the appearance of the ground-glass opacities could conceivably represent an atypical granulomatous infectious process as an alternative. This is not classic obvious recurrence with well-defined mass or nodules. 2. There is some ill-defined clustered nodularity in the right lower lobe only faintly metabolic, maximum SUV 3.0. 3. Overall I believe that this process require surveillance to differentiate an inflammatory/infectious condition from recurrent malignancy. The adenopathy is certainly concerning. 4. No findings of malignancy in the neck, abdomen/pelvis, or skeleton. 5. Sigmoid colon diverticulosis. 6. Emphysema. 7. Chronic ethmoid sinusitis.      03/25/2017 Imaging    CT chest- Stable 5.9 cm left lower lobe ground-glass opacity, hypermetabolic on prior PET, worrisome for primary bronchogenic neoplasm such as low-grade adenocarcinoma.  Stable 2.2 cm cavitary lesion in the central right lower lobe with surrounding hypermetabolic nodularity measuring up to 1.8 cm, worrisome for primary bronchogenic neoplasm such as squamous cell carcinoma.  Hypermetabolic mediastinal lymph nodes measuring up to 11 mm short axis, as above. Bilateral hilar regions are hypermetabolic on PET but poorly evaluated on  unenhanced CT.  Emphysema and aortic atherosclerosis.       04/23/2017 Procedure    Bronchoscopy and EBUS with mediastinal lymph node aspirations by Dr. Roxan Hockey.      04/26/2017 Pathology Results    1. Lung, biopsy, Left upper lobe - ADENOCARCINOMA. - SEE COMMENT. 2. Lung, biopsy, Right lower lobe - ADENOCARCINOMA. - SEE COMMENT. 3. Lung, biopsy, Right lower lobe target 2 - ATYPICAL CELLS. - SEE COMMENT.      04/26/2017 Relapse/Recurrence         05/14/2017 Pathology Results    PDL1 NEGATIVE.  TPS 0%.      05/19/2017 Pathology Results    [employer] testing- MS-stable.  TMB 6 Muts/Mb (Intermediate).  STK11 E293*.  Negative for EGFR, KRAS, ALK, BRAF, MET, RET, ERBB2, and ROS1.  Potential treatment options: Atezolizumab/Durvalumab/Nivolumab/Pembrolizumab for TMB score.      07/27/2017 Imaging    CT C/A/P: IMPRESSION: Resolution of right lower lobe lesion and nodules.  Unchanged ill-defined nonspecific left lower lobe ground-glass opacity and stable mildly prominent mediastinal lymph nodes.  No evidence of metastatic disease or acute abnormality within the abdomen or pelvis.  Aortic Atherosclerosis (ICD10-I70.0) and Emphysema (ICD10-J43.9).      10/08/2017 Imaging    CT C/A/P: Similar appearance of the chest with an upper normal sized paratracheal lymph node, confluent ground-glass opacity in the left lower lobe, and severe centrilobular emphysema, but no findings of progressive or definite active malignancy.        HISTORY OF PRESENT ILLNESS:  (From Kirby Crigler, PA-C's last note on 12/05/16)   On 01/07/17, she had CT chest for surveillance of her lung cancer; new nodules were noted and concerning for malignancy.  She was recently admitted on 01/10/17-01/13/17 for COPD exacerbation; she was discharged on oral steroids at that time.  On 01/21/17, she underwent PET scan revealing new hypermetabolic adenopathy in the mediastinum and hilum, with progressive hypermetabolic groundglass density in the left lower lobe; there is mention of  possible concern of malignancy with a lymphangitic component in the left lower lobe, but this may also represent an atypical granulomatous infectious process as well; there were no findings of malignancy in the neck, abdomen/pelvis, or skeleton. It was recommended by cardiothoracic surgery that we repeat imaging in 2-3 months after hospitalization to re-assess lesions noted to CT and PET scans.     INTERVAL HISTORY: Patient presents today for follow-up and next Tecentriq treatment with her daughter today.  Patient states that she has noted a mass in her right neck for the past 2-3 days.  She states it is not tender.  She denies any inciting factors.  Today she states she feels tired and sleepy.  She denies any pain.  She denies any chest pain, shortness of breath, abdominal pain, nausea, vomiting, diarrhea, focal weakness.  REVIEW OF SYSTEMS:  Review of Systems  Constitutional: Positive for fatigue. Negative for appetite change, chills and fever.  HENT:   Positive for lump/mass. Negative for hearing loss, mouth sores, sore throat, tinnitus and trouble swallowing.   Eyes: Negative.  Negative for eye problems and icterus.  Respiratory: Positive for shortness of breath (chronic). Negative for chest tightness, cough, hemoptysis and wheezing.   Cardiovascular: Negative.  Negative for chest pain, leg swelling and palpitations.  Gastrointestinal: Negative.  Negative for abdominal distention, abdominal pain, blood in stool, constipation, diarrhea, nausea and vomiting.  Endocrine: Negative.  Negative for hot flashes.  Genitourinary: Negative.  Negative for difficulty urinating, dysuria, frequency, hematuria and vaginal bleeding.  Musculoskeletal: Negative.  Negative for arthralgias and neck pain.  Skin: Negative.  Negative for itching and rash.  Neurological: Negative for dizziness, headaches and speech difficulty.  Hematological: Negative for adenopathy. Does not bruise/bleed easily.    Psychiatric/Behavioral: Negative.  Negative for confusion. The patient is not nervous/anxious.      PAST MEDICAL/SURGICAL HISTORY:  Past Medical History:  Diagnosis Date  . Adenocarcinoma of lung ([place])    Left lung 2009, resected  . Anginal pain ([place])   . Arthritis   . Back pain   . CHF (congestive heart failure) ([facility])   . COPD (chronic obstructive pulmonary disease) ([facility])   . Diverticulitis   . Dyslipidemia   . Essential hypertension   . H/O ventral hernia   . Non-obstructive CAD    a. 04/2015 NSTEMI/Cath: LAD 10p, LCX 67m RCA 349m20d, EF 35-40 w/ apical ballooning.  . On home O2    3L N/C  . Osteoporosis   . Osteoporosis 11/04/2015   Managed by Dr. FaLegrand Rams . Parkinson's disease ([facility]  . Shortness of breath   . Takotsubo cardiomyopathy    a. 04/2015 Echo: EF 45-50%, mid-dist anterior/apical/inferoapical HK w/ hyperdynamic base. Gr 1 DD, mild AI, mild-mod MR, triv TR, PASP 4866m;  b. 04/2015 LV gram: Ef 35-40% w/ apical ballooning.  . Type II diabetes mellitus ([facility] . Ventricular bigeminy    a. 04/2015 in setting of NSTEMI/Takotsubo.   Past Surgical History:  Procedure Laterality Date  . ABDOMINAL HYSTERECTOMY    . CARDIAC CATHETERIZATION N/A 04/17/2015   Procedure: Left Heart Cath and Coronary Angiography;  Surgeon: ThoTroy SineD;  Location: [facility];  Service: Cardiovascular;  Laterality: N/A;  . CHOLECYSTECTOMY    . COLONOSCOPY N/A 09/18/2014   Procedure: COLONOSCOPY;  Surgeon: SanDanie BinderD;  Location: [facility];  Service: Endoscopy;  Laterality: N/A;  8:30 AM - moved to 10:30 - CRosendo Gros notify pt  . ECTOPIC PREGNANCY SURGERY    . INCISIONAL HERNIA REPAIR N/A 08/26/2013   Procedure: HERNIA REPAIR INCISIONAL WITH MESH;  Surgeon: MarJamesetta SoD;  Location: [facility];  Service: General;  Laterality: N/A;  . IR FLUORO GUIDE PORT INSERTION RIGHT  04/30/2017  . IR US [place] VASC ACCESS RIGHT  04/30/2017  . LUNG CANCER SURGERY    . VIDEO  BRONCHOSCOPY WITH ENDOBRONCHIAL NAVIGATION N/A 04/23/2017   Procedure: VIDEO BRONCHOSCOPY WITH ENDOBRONCHIAL NAVIGATION;  Surgeon: HenMelrose NakayamaD;  [facility];  Laterality: N/A;  . VIDEO BRONCHOSCOPY WITH ENDOBRONCHIAL ULTRASOUND N/A 04/23/2017   Procedure: VIDEO BRONCHOSCOPY WITH ENDOBRONCHIAL ULTRASOUND;  Surgeon: HenMelrose NakayamaD;  Location: [facility];  Service: Thoracic;  Laterality: N/A;     SOCIAL HISTORY:  Social History   Socioeconomic History  . Marital status: Widowed    Spouse name: Not on file  . Number of children: Not on file  . Years of education: Not on file  . Highest education level: Not on file  Social Needs  . Financial resource strain: Not on file  . Food insecurity - worry: Not on file  . Food insecurity - inability: Not on file  . Transportation needs - medical: Not on file  . Transportation needs - non-medical: Not on file  Occupational History  . Not on file  Tobacco Use  . Smoking status: Former Smoker    Years: 20.00    Types: Cigarettes    Last attempt  to quit: 08/13/2006    Years since quitting: 11.2  . Smokeless tobacco: Never Used  Substance and Sexual Activity  . Alcohol use: No    Alcohol/week: 0.0 oz  . Drug use: No  . Sexual activity: Not Currently    Birth control/protection: Surgical  Other Topics Concern  . Not on file  Social History Narrative  . Not on file    FAMILY HISTORY:  Family History  Problem Relation Age of Onset  . Diabetes Mother   . Hypertension Mother   . Diabetes Father   . Asthma Unknown   . Cancer Unknown   . Heart attack Sister        X2  . Colon cancer Neg Hx     CURRENT MEDICATIONS:  Outpatient Encounter Medications as of 11/12/2017  Medication Sig Note  . albuterol (PROAIR HFA) 108 (90 BASE) MCG/ACT inhaler Inhale 2 puffs into the lungs every 4 (four) hours as needed for wheezing or shortness of breath.   Marland Kitchen alendronate (FOSAMAX) 70 MG tablet Take 70 mg by mouth  every Friday.    Marland Kitchen aspirin EC 81 MG tablet Take 81 mg by mouth daily.   Huey Bienenstock (TECENTRIQ IV) Inject into the vein. Every 3 weeks 09/20/2017: Scheduled for procedure next week  . atorvastatin (LIPITOR) 40 MG tablet Take 40 mg by mouth at bedtime.    Marland Kitchen buPROPion (WELLBUTRIN SR) 150 MG 12 hr tablet Take 150 mg by mouth 2 (two) times daily.   . cholecalciferol (VITAMIN D) 1000 units tablet Take 1,000 Units by mouth daily.   . clotrimazole-betamethasone (LOTRISONE) cream Apply 1 application topically 2 (two) times daily.   . furosemide (LASIX) 40 MG tablet Take 1 tablet (40 mg total) by mouth daily. Pt is able to take one additional tablet daily for weight increases of 3lbs in a day or 5lbs in a week. Resume on 10/20   . gabapentin (NEURONTIN) 300 MG capsule Take 600 mg by mouth at bedtime.    . Garlic 161 MG TABS Take 300 mg by mouth daily.   Marland Kitchen guaiFENesin (MUCINEX) 600 MG 12 hr tablet Take 1 tablet (600 mg total) by mouth 2 (two) times daily.   Marland Kitchen ipratropium-albuterol (DUONEB) 0.5-2.5 (3) MG/3ML SOLN Inhale 3 mLs into the lungs every 6 (six) hours as needed (for wheezing/shortness of breath).    Marland Kitchen KLOR-CON 10 10 MEQ tablet TAKE 1 TABLET (10 MEQ TOTAL) BY MOUTH DAILY. *FURTHER REFILLS NEED TO BE AUTHORIZED BY PCP*   . LANTUS SOLOSTAR 100 UNIT/ML Solostar Pen Inject 15 Units into the skin at bedtime.    . lidocaine-prilocaine (EMLA) cream Apply to affected area once   . linagliptin (TRADJENTA) 5 MG TABS tablet Take 5 mg by mouth daily.   Marland Kitchen loratadine (CLARITIN) 10 MG tablet Take 10 mg by mouth daily.   . meclizine (ANTIVERT) 25 MG tablet Take 25 mg by mouth daily.    . metoprolol succinate (TOPROL-XL) 25 MG 24 hr tablet Take 1 tablet (25 mg total) by mouth daily.   . nitroGLYCERIN (NITROSTAT) 0.4 MG SL tablet Place 0.4 mg under the tongue every 5 (five) minutes as needed for chest pain.   . Omega-3 Fatty Acids (FISH OIL PO) Take 1 capsule by mouth daily.   Marland Kitchen omeprazole (PRILOSEC) 40 MG  capsule Take 40 mg by mouth daily.   . ondansetron (ZOFRAN) 8 MG tablet Take 1 tablet (8 mg total) by mouth 2 (two) times daily as needed (Nausea or vomiting).   Marland Kitchen  OXYGEN Inhale 3 L into the lungs continuous.   . polyethylene glycol powder (GLYCOLAX/MIRALAX) powder Take 1 capful daily as needed. (Patient taking differently: Take 17 g by mouth daily as needed for mild constipation or moderate constipation. Take 1 capful daily as needed.)   . predniSONE (DELTASONE) 10 MG tablet Take 25m po daily for 2 days then 321mdaily for 2 days then 2027maily for 2 days then 76m69mily for 2 days then stop   . prochlorperazine (COMPAZINE) 10 MG tablet Take 1 tablet (10 mg total) by mouth every 6 (six) hours as needed (Nausea or vomiting).   . pyridoxine (B-6) 100 MG tablet Take 100 mg by mouth daily.    . rOMarland KitchenINIRole (REQUIP) 0.5 MG tablet Take 0.5 mg by mouth 2 (two) times daily.    . sodium chloride (OCEAN) 0.65 % SOLN nasal spray Place 1 spray into both nostrils every 3 (three) hours as needed for congestion.    . traMADol (ULTRAM) 50 MG tablet Take 50 mg by mouth every 12 (twelve) hours as needed for moderate pain.    . zoMarland Kitchenpidem (AMBIEN) 5 MG tablet Take 5 mg by mouth at bedtime as needed for sleep.    No facility-administered encounter medications on file as of 11/12/2017.     ALLERGIES:  Allergies  Allergen Reactions  . Lisinopril Cough     PHYSICAL EXAM:  ECOG Performance status: 1-2 RN vitals reviewed  Physical Exam  Constitutional: She is oriented to person, place, and time and well-developed, well-nourished, and in no distress. No distress.  oxygen dependent with portable oxygen   HENT:  Head: Normocephalic and atraumatic.  Mouth/Throat: Oropharynx is clear and moist. No oropharyngeal exudate.  3 cm mass in right neck.  Eyes: Conjunctivae are normal. Pupils are equal, round, and reactive to light. No scleral icterus.  Neck: Normal range of motion. Neck supple. No JVD present.    Cardiovascular: Normal rate, regular rhythm and normal heart sounds. Exam reveals no gallop and no friction rub.  No murmur heard. Pulmonary/Chest: Effort normal. No respiratory distress. She has no wheezes. She has no rhonchi. She has no rales.  Abdominal: Soft. Bowel sounds are normal. She exhibits no distension. There is no tenderness. There is no rebound and no guarding.  Musculoskeletal: Normal range of motion. She exhibits no edema or tenderness.  Lymphadenopathy:    She has no cervical adenopathy.  Neurological: She is alert and oriented to person, place, and time. No cranial nerve deficit.  Skin: Skin is warm and dry. No rash noted. No erythema. No pallor.  Psychiatric: Mood, memory, affect and judgment normal.  Nursing note and vitals reviewed.    LABORATORY DATA:  I have reviewed the labs as listed.  CBC    Component Value Date/Time   WBC 9.9 11/12/2017 0927   RBC 3.79 (L) 11/12/2017 0927   HGB 11.1 (L) 11/12/2017 0927   HCT 35.9 (L) 11/12/2017 0927   PLT 218 11/12/2017 0927   MCV 94.7 11/12/2017 0927   MCH 29.3 11/12/2017 0927   MCHC 30.9 11/12/2017 0927   RDW 15.5 11/12/2017 0927   LYMPHSABS 1.7 11/12/2017 0927   MONOABS 1.0 11/12/2017 0927   EOSABS 0.2 11/12/2017 0927   BASOSABS 0.0 11/12/2017 0927   CMP Latest Ref Rng & Units 11/12/2017 10/22/2017 10/05/2017  Glucose 65 - 99 mg/dL 176(H) 191(H) 183(H)  BUN 6 - 20 mg/dL 21(H) 12 17  Creatinine 0.44 - 1.00 mg/dL 2.00(H) 1.46(H) 1.19(H)  Sodium 135 -  145 mmol/L 135 136 139  Potassium 3.5 - 5.1 mmol/L 3.9 4.0 3.8  Chloride 101 - 111 mmol/L 100(L) 97(L) 105  CO2 22 - 32 mmol/L _0 Calcium 8.9 - 10.3 mg/dL 9.3 9.5 8.4(L)  Total Protein 6.5 - 8.1 g/dL 6.7 7.4 5.7(L)  Total Bilirubin 0.3 - 1.2 mg/dL 0.4 0.6 0.5  Alkaline Phos 38 - 126 U/L 54 59 34(L)  AST 15 - 41 U/L 23 28 14(L)  ALT 14 - 54 U/L _1 PENDING LABS:    DIAGNOSTIC IMAGING:  PET scan: 01/21/17     CT chest:  03/25/17      PATHOLOGY:     ASSESSMENT & PLAN:  1.Cancer Staging Adenocarcinoma of lung [facility]) Staging form: Lung, AJCC 7th Edition - Clinical: Stage IA - Signed by Baird Cancer, PA-C on 02/27/2014 - Pathologic stage from 04/26/2017: Stage IV (T2b(2), N3, M1a) - Signed by Baird Cancer, PA-C on 04/26/2017   PLAN: -CT neck with contrast stat to evaluate new right neck mass. If this is new lymphadenopathy, will need to get it biopsied. -Labs reviewed, proceed with cycle 9 of tecentriq today. Continue q3 week tecentriq treatments; she is tolerating them very well without side effects.  -Will give her an extra 500 cc of NS today since she has a bump in her creatinine from 1.46 to 2 today.  RTC in 3 weeks for follow up and tecentriq treatment.  All questions were answered to patient's stated satisfaction. Encouraged patient to call with any new concerns or questions before her next visit to the cancer center and we can certain see her sooner, if needed.     Twana First, MD

## 2017-11-12 NOTE — Patient Instructions (Signed)
.  [facility] Discharge Instructions for Patients Receiving Chemotherapy   Beginning January 23rd 2017 lab work for the [facility] will be done in the  Main lab at [facility] on 1st floor. If you have a lab appointment with the [facility] please come in thru the  Main Entrance and check in at the main information desk   Today you received the following chemotherapy agents Tecentriq as well as hydration. Follow-up as scheduled. Call clinic for any questions or concerns  To help prevent nausea and vomiting after your treatment, we encourage you to take your nausea medication   If you develop nausea and vomiting, or diarrhea that is not controlled by your medication, call the clinic.  The clinic phone number is [phone]. Office hours are Monday-Friday 8:30am-5:00pm.  BELOW ARE SYMPTOMS THAT SHOULD BE REPORTED IMMEDIATELY:  *FEVER GREATER THAN 101.0 F  *CHILLS WITH OR WITHOUT FEVER  NAUSEA AND VOMITING THAT IS NOT CONTROLLED WITH YOUR NAUSEA MEDICATION  *UNUSUAL SHORTNESS OF BREATH  *UNUSUAL BRUISING OR BLEEDING  TENDERNESS IN MOUTH AND THROAT WITH OR WITHOUT PRESENCE OF ULCERS  *URINARY PROBLEMS  *BOWEL PROBLEMS  UNUSUAL RASH Items with * indicate a potential emergency and should be followed up as soon as possible. If you have an emergency after office hours please contact your primary care physician or go to the nearest emergency department.  Please call the clinic during office hours if you have any questions or concerns.   You may also contact the Patient Navigator at [phone] should you have any questions or need assistance in obtaining follow up care.      Resources For Cancer Patients and their Caregivers ? American Cancer Society: Can assist with transportation, wigs, general needs, runs Look Good Feel Better.        858-869-1540 ? Cancer Care: Provides financial assistance, online support groups, medication/co-pay assistance.   1-800-813-HOPE 757-157-4829) ? [facility] Assists [place] cancer patients and their families through emotional , educational and financial support.  (646)201-9031 ? [place] DSS Where to apply for food stamps, Medicaid and utility assistance. 469-424-1243 ? [facility]: Transportation to medical appointments. (308)344-0912 ? Social Security Administration: May apply for disability if have a Stage IV cancer. 450-872-9916 602-640-6870 ? [employer], Disability and Transit Services: Assists with nutrition, care and transit needs. [phone]

## 2017-11-12 NOTE — Progress Notes (Signed)
1020 Labs reviewed with Dr. Talbert Cage and pt approved for Tecentriq infusion today with added hydration of 500 ml NS over 1 hr per MD orders                              Angela Lawson tolerated Tecentriq infusion well without complaints or incident. VSS upon discharge.Pt discharged via wheelchair in satisfactory condition accompanied by her daughter

## 2017-11-12 NOTE — Patient Instructions (Signed)
[facility] at [facility] Discharge Instructions  RECOMMENDATIONS MADE BY THE CONSULTANT AND ANY TEST RESULTS WILL BE SENT TO YOUR REFERRING PHYSICIAN.  You were seen today by Dr. Louise Zhou    Thank you for choosing [facility] at [facility] to provide your oncology and hematology care.  To afford each patient quality time with our provider, please arrive at least 15 minutes before your scheduled appointment time.    If you have a lab appointment with the Cancer Center please come in thru the  Main Entrance and check in at the main information desk  You need to re-schedule your appointment should you arrive 10 or more minutes late.  We strive to give you quality time with our providers, and arriving late affects you and other patients whose appointments are after yours.  Also, if you no show three or more times for appointments you may be dismissed from the clinic at the providers discretion.     Again, thank you for choosing [facility].  Our hope is that these requests will decrease the amount of time that you wait before being seen by our physicians.       _____________________________________________________________  Should you have questions after your visit to [facility], please contact our office at [phone] between the hours of 8:30 a.m. and 4:30 p.m.  Voicemails left after 4:30 p.m. will not be returned until the following business day.  For prescription refill requests, have your pharmacy contact our office.       Resources For Cancer Patients and their Caregivers ? American Cancer Society: Can assist with transportation, wigs, general needs, runs Look Good Feel Better.        1-888-227-6333 ? Cancer Care: Provides financial assistance, online support groups, medication/co-pay assistance.  1-800-813-HOPE (4673) ? [facility] Assists [place] cancer patients and their  families through emotional , educational and financial support.  336-427-4357 ? [place] DSS Where to apply for food stamps, Medicaid and utility assistance. 336-342-1394 ? [facility]: Transportation to medical appointments. 336-347-2287 ? Social Security Administration: May apply for disability if have a Stage IV cancer. 336-342-7796 1-800-772-1213 ? [place] Aging, Disability and Transit Services: Assists with nutrition, care and transit needs. 336-349-2343  Cancer Center Support Programs: [email or web address]@ > Cancer Support Group  2nd Tuesday of the month 1pm-2pm, Journey Room  > Creative Journey  3rd Tuesday of the month 1130am-1pm, Journey Room  > Look Good Feel Better  1st Wednesday of the month 10am-12 noon, Journey Room (Call American Cancer Society to register 1-800-395-5775)    

## 2017-11-24 ENCOUNTER — Ambulatory Visit (HOSPITAL_COMMUNITY): Payer: Medicare Other

## 2017-11-24 DIAGNOSIS — J449 Chronic obstructive pulmonary disease, unspecified: Secondary | ICD-10-CM | POA: Diagnosis not present

## 2017-11-25 DIAGNOSIS — G894 Chronic pain syndrome: Secondary | ICD-10-CM | POA: Diagnosis not present

## 2017-11-25 DIAGNOSIS — G20 Parkinson's disease: Secondary | ICD-10-CM | POA: Diagnosis not present

## 2017-11-25 DIAGNOSIS — G253 Myoclonus: Secondary | ICD-10-CM | POA: Diagnosis not present

## 2017-11-27 NOTE — Progress Notes (Signed)
[place], [place] [id number]   CLINIC:  Medical Oncology/Hematology  PCP:  Rosita Fire, MD [place]  [phone]   REASON FOR VISIT:  New/recurrent adenocarcinoma of the left and right lung; h/o Stage IA NSCLC s/p resection in 2009.   CURRENT THERAPY: Tecentriq  today  BRIEF ONCOLOGIC HISTORY:    Adenocarcinoma of lung ([place])   07/07/2008 Surgery    Performed in [place], [place].  Consistent with poorly differentiated adenocarcinoma.      02/27/2014 Imaging    CT of chest demonstrating a RLL solid nodular component measuring 9 mm at the site of previous ground-glass opacity. Slight increase in size of an anterior mediastinal node measuring 1.1 cm with stable 1 cm pretracheal lymph node. Consider PET      03/20/2014 Imaging    PET scan- No hypermetabolic pulmonary lesions to suggest recurrent or metastatic pulmonary disease.  Weakly positive left internal mammary lymph node and left axillary lymph node (? inflammatory process involving the left breast).      11/07/2015 Imaging    CT chest- 1. No evidence of metastatic involvement of the lungs. The previously noted small noncalcified lung nodules are stable. 2. Vague focal opacity in the right lower lobe may represent scarring and is stable. Continued followup however is recommended in 1 year.      01/08/2017 Imaging    CT chest- 1. New nodules along a thick walled bullous lesion in the right lower lobe, suspicious for malignancy. 2. New fairly large region of confluent ground-glass opacity posteriorly in the remaining left lung, low grade adenocarcinoma is not excluded and there is mild new nodularity along the upper margin of this process. 3. Stable biapical pleuroparenchymal scarring with several stable nodules in the left lung. 4. Borderline adenopathy in the mediastinum, essentially stable from prior. 5. Coronary, aortic arch, and branch vessel  atherosclerotic vascular disease. 6. Small hiatal hernia. 7. Hypodense left thyroid lesion. If not previously worked up, thyroid ultrasound could be employed.      01/21/2017 PET scan    1. New hypermetabolic adenopathy in the mediastinum and hila, with progressive hypermetabolic ground-glass density posteriorly in the left lower lobe with faint nodular components, and some low-grade hypermetabolic activity in ground-glass opacity dependently in the right upper lobe. Although the appearance is concerning for malignancy potentially with a lymphangitic component in the left lower lobe, the unusual configuration in the appearance of the ground-glass opacities could conceivably represent an atypical granulomatous infectious process as an alternative. This is not classic obvious recurrence with well-defined mass or nodules. 2. There is some ill-defined clustered nodularity in the right lower lobe only faintly metabolic, maximum SUV 3.0. 3. Overall I believe that this process require surveillance to differentiate an inflammatory/infectious condition from recurrent malignancy. The adenopathy is certainly concerning. 4. No findings of malignancy in the neck, abdomen/pelvis, or skeleton. 5. Sigmoid colon diverticulosis. 6. Emphysema. 7. Chronic ethmoid sinusitis.      03/25/2017 Imaging    CT chest- Stable 5.9 cm left lower lobe ground-glass opacity, hypermetabolic on prior PET, worrisome for primary bronchogenic neoplasm such as low-grade adenocarcinoma.  Stable 2.2 cm cavitary lesion in the central right lower lobe with surrounding hypermetabolic nodularity measuring up to 1.8 cm, worrisome for primary bronchogenic neoplasm such as squamous cell carcinoma.  Hypermetabolic mediastinal lymph nodes measuring up to 11 mm short axis, as above. Bilateral hilar regions are hypermetabolic on PET but poorly evaluated on unenhanced  CT.  Emphysema and aortic atherosclerosis.      04/23/2017  Procedure    Bronchoscopy and EBUS with mediastinal lymph node aspirations by Dr. Roxan Hockey.      04/26/2017 Pathology Results    1. Lung, biopsy, Left upper lobe - ADENOCARCINOMA. - SEE COMMENT. 2. Lung, biopsy, Right lower lobe - ADENOCARCINOMA. - SEE COMMENT. 3. Lung, biopsy, Right lower lobe target 2 - ATYPICAL CELLS. - SEE COMMENT.      04/26/2017 Relapse/Recurrence         05/14/2017 Pathology Results    PDL1 NEGATIVE.  TPS 0%.      05/19/2017 Pathology Results    [employer] testing- MS-stable.  TMB 6 Muts/Mb (Intermediate).  STK11 E293*.  Negative for EGFR, KRAS, ALK, BRAF, MET, RET, ERBB2, and ROS1.  Potential treatment options: Atezolizumab/Durvalumab/Nivolumab/Pembrolizumab for TMB score.      07/27/2017 Imaging    CT C/A/P: IMPRESSION: Resolution of right lower lobe lesion and nodules.  Unchanged ill-defined nonspecific left lower lobe ground-glass opacity and stable mildly prominent mediastinal lymph nodes.  No evidence of metastatic disease or acute abnormality within the abdomen or pelvis.  Aortic Atherosclerosis (ICD10-I70.0) and Emphysema (ICD10-J43.9).      10/08/2017 Imaging    CT C/A/P: Similar appearance of the chest with an upper normal sized paratracheal lymph node, confluent ground-glass opacity in the left lower lobe, and severe centrilobular emphysema, but no findings of progressive or definite active malignancy.        HISTORY OF PRESENT ILLNESS:  (From Kirby Crigler, PA-C's last note on 12/05/16)   On 01/07/17, she had CT chest for surveillance of her lung cancer; new nodules were noted and concerning for malignancy. She was recently admitted on 01/10/17-01/13/17 for COPD exacerbation; she was discharged on oral steroids at that time.  On 01/21/17, she underwent PET scan revealing new hypermetabolic adenopathy in the mediastinum and hilum, with progressive hypermetabolic groundglass density in the left lower lobe; there is mention of possible  concern of malignancy with a lymphangitic component in the left lower lobe, but this may also represent an atypical granulomatous infectious process as well; there were no findings of malignancy in the neck, abdomen/pelvis, or skeleton. It was recommended by cardiothoracic surgery that we repeat imaging in 2-3 months after hospitalization to re-assess lesions noted to CT and PET scans.     INTERVAL HISTORY: Patient presents today for follow-up and Tecentriq treatment with her daughter today.   Patient was seen by Dr. Talbert Cage on 11/12/2017 where she noted a 3cm  mass to her right neck for 2-3 days.  She stated it was not tender and denied any inciting factors.  A CT of soft tissue neck was performed not indicating any new adenopathy.  She stated she felt tired and sleepy but denied any pain.  She denied any chest pain shortness of breath abdominal pain, nausea, vomiting, diarrhea or focal weakness.  Patient  presents today for Tecentriq treatment.  Patient states she has been doing well.  The neck mass has resolved spontaneously.  She continues her oxygen at 3 L.  She notes an occasional productive cough and shortness of breath with exertion.  The sputum is yellow in color and thick.  She denies fevers or chills.  Daughter notes this appears to be worse than normal.  Daughter states last night patient coughed all night.  She continues to use her inhaler as prescribed.  She continues to note fatigue and weakness.  Appetite is 25%.  She has been drinking  1 Glucerna daily.  She denies swelling in lower extremities.  She has bowel movements daily.  Offers no urinary complaints. No chest pain.   REVIEW OF SYSTEMS:  Review of Systems  Constitutional: Positive for appetite change and fatigue. Negative for chills and fever.  HENT:   Negative for hearing loss, lump/mass, mouth sores, sore throat, tinnitus and trouble swallowing.   Eyes: Negative.  Negative for eye problems and icterus.  Respiratory: Positive for  cough and shortness of breath (chronic). Negative for chest tightness, hemoptysis and wheezing.   Cardiovascular: Negative.  Negative for chest pain, leg swelling and palpitations.  Gastrointestinal: Negative.  Negative for abdominal distention, abdominal pain, blood in stool, constipation, diarrhea, nausea and vomiting.  Endocrine: Negative.  Negative for hot flashes.  Genitourinary: Negative.  Negative for difficulty urinating, dysuria, frequency, hematuria and vaginal bleeding.   Musculoskeletal: Negative.  Negative for arthralgias and neck pain.  Skin: Negative.  Negative for itching and rash.  Neurological: Negative for dizziness, headaches and speech difficulty.  Hematological: Negative for adenopathy. Does not bruise/bleed easily.  Psychiatric/Behavioral: Negative.  Negative for confusion. The patient is not nervous/anxious.      PAST MEDICAL/SURGICAL HISTORY:  Past Medical History:  Diagnosis Date  . Adenocarcinoma of lung ([place])    Left lung 2009, resected  . Anginal pain ([place])   . Arthritis   . Back pain   . CHF (congestive heart failure) ([place])   . COPD (chronic obstructive pulmonary disease) ([place])   . Diverticulitis   . Dyslipidemia   . Essential hypertension   . H/O ventral hernia   . Non-obstructive CAD    a. 04/2015 NSTEMI/Cath: LAD 10p, LCX 62m RCA 38m20d, EF 35-40 w/ apical ballooning.  . On home O2    3L N/C  . Osteoporosis   . Osteoporosis 11/04/2015   Managed by Dr. FaLegrand Rams . Parkinson's disease ([facility]  . Shortness of breath   . Takotsubo cardiomyopathy    a. 04/2015 Echo: EF 45-50%, mid-dist anterior/apical/inferoapical HK w/ hyperdynamic base. Gr 1 DD, mild AI, mild-mod MR, triv TR, PASP 4825m;  b. 04/2015 LV gram: Ef 35-40% w/ apical ballooning.  . Type II diabetes mellitus ([facility] . Ventricular bigeminy    a. 04/2015 in setting of NSTEMI/Takotsubo.   Past Surgical History:  Procedure Laterality Date  . ABDOMINAL HYSTERECTOMY    . CARDIAC  CATHETERIZATION N/A 04/17/2015   Procedure: Left Heart Cath and Coronary Angiography;  Surgeon: ThoTroy SineD;  Location: [facility];  Service: Cardiovascular;  Laterality: N/A;  . CHOLECYSTECTOMY    . COLONOSCOPY N/A 09/18/2014   Procedure: COLONOSCOPY;  Surgeon: SanDanie BinderD;  Location: [facility] SU[facility];  Service: Endoscopy;  Laterality: N/A;  8:30 AM - moved to 10:30 - CRosendo Gros notify pt  . ECTOPIC PREGNANCY SURGERY    . INCISIONAL HERNIA REPAIR N/A 08/26/2013   Procedure: HERNIA REPAIR INCISIONAL WITH MESH;  Surgeon: MarJamesetta SoD;  Location: [facility] ORS;  Service: General;  Laterality: N/A;  . IR FLUORO GUIDE PORT INSERTION RIGHT  04/30/2017  . IR US [place] VASC ACCESS RIGHT  04/30/2017  . LUNG CANCER SURGERY    . VIDEO BRONCHOSCOPY WITH ENDOBRONCHIAL NAVIGATION N/A 04/23/2017   Procedure: VIDEO BRONCHOSCOPY WITH ENDOBRONCHIAL NAVIGATION;  Surgeon: HenMelrose NakayamaD;  [facility];  Laterality: N/A;  . VIDEO BRONCHOSCOPY WITH ENDOBRONCHIAL ULTRASOUND N/A 04/23/2017   Procedure: VIDEO BRONCHOSCOPY WITH ENDOBRONCHIAL ULTRASOUND;  Surgeon: Melrose Nakayama, MD;  Location: [facility] OR;  Service: Thoracic;  Laterality: N/A;     SOCIAL HISTORY:  Social History   Socioeconomic History  . Marital status: Widowed    Spouse name: Not on file  . Number of children: Not on file  . Years of education: Not on file  . Highest education level: Not on file  Social Needs  . Financial resource strain: Not on file  . Food insecurity - worry: Not on file  . Food insecurity - inability: Not on file  . Transportation needs - medical: Not on file  . Transportation needs - non-medical: Not on file  Occupational History  . Not on file  Tobacco Use  . Smoking status: Former Smoker    Years: 20.00    Types: Cigarettes    Last attempt to quit: 08/13/2006    Years since quitting: 11.3  . Smokeless tobacco: Never Used  Substance and Sexual Activity  . Alcohol  use: No    Alcohol/week: 0.0 oz  . Drug use: No  . Sexual activity: Not Currently    Birth control/protection: Surgical  Other Topics Concern  . Not on file  Social History Narrative  . Not on file    FAMILY HISTORY:  Family History  Problem Relation Age of Onset  . Diabetes Mother   . Hypertension Mother   . Diabetes Father   . Asthma Unknown   . Cancer Unknown   . Heart attack Sister        X2  . Colon cancer Neg Hx     CURRENT MEDICATIONS:  No facility-administered encounter medications on file as of 12/03/2017.    Outpatient Encounter Medications as of 12/03/2017  Medication Sig Note  . albuterol (PROAIR HFA) 108 (90 BASE) MCG/ACT inhaler Inhale 2 puffs into the lungs every 4 (four) hours as needed for wheezing or shortness of breath.   Marland Kitchen alendronate (FOSAMAX) 70 MG tablet Take 70 mg by mouth every Friday.  12/04/2017: Patient due for a dose today  . aspirin EC 81 MG tablet Take 81 mg by mouth daily.   Huey Bienenstock (TECENTRIQ IV) Inject into the vein. Every 3 weeks   . atorvastatin (LIPITOR) 40 MG tablet Take 40 mg by mouth at bedtime.    Marland Kitchen buPROPion (WELLBUTRIN SR) 150 MG 12 hr tablet Take 150 mg by mouth 2 (two) times daily.   . cholecalciferol (VITAMIN D) 1000 units tablet Take 1,000 Units by mouth daily.   . clotrimazole-betamethasone (LOTRISONE) cream Apply 1 application topically 2 (two) times daily. 12/04/2017: Patient still has if needed but has not taken recently  . furosemide (LASIX) 40 MG tablet Take 1 tablet (40 mg total) by mouth daily. Pt is able to take one additional tablet daily for weight increases of 3lbs in a day or 5lbs in a week. Resume on 10/20   . gabapentin (NEURONTIN) 300 MG capsule Take 600 mg by mouth at bedtime.    . Garlic 694 MG TABS Take 300 mg by mouth daily.   Marland Kitchen guaiFENesin (MUCINEX) 600 MG 12 hr tablet Take 1 tablet (600 mg total) by mouth 2 (two) times daily.   Marland Kitchen ipratropium-albuterol (DUONEB) 0.5-2.5 (3) MG/3ML SOLN Inhale 3 mLs into  the lungs every 6 (six) hours as needed (for wheezing/shortness of breath).    Marland Kitchen KLOR-CON 10 10 MEQ tablet TAKE 1 TABLET (10 MEQ TOTAL) BY MOUTH DAILY. *FURTHER REFILLS NEED TO BE AUTHORIZED BY PCP*   . LANTUS  SOLOSTAR 100 UNIT/ML Solostar Pen Inject 15 Units into the skin at bedtime.    . lidocaine-prilocaine (EMLA) cream Apply to affected area once   . linagliptin (TRADJENTA) 5 MG TABS tablet Take 5 mg by mouth daily.   . meclizine (ANTIVERT) 25 MG tablet Take 25 mg by mouth daily.    . metoprolol succinate (TOPROL-XL) 25 MG 24 hr tablet Take 1 tablet (25 mg total) by mouth daily.   . nitroGLYCERIN (NITROSTAT) 0.4 MG SL tablet Place 0.4 mg under the tongue every 5 (five) minutes as needed for chest pain.   . Omega-3 Fatty Acids (FISH OIL PO) Take 1 capsule by mouth daily.   Marland Kitchen omeprazole (PRILOSEC) 40 MG capsule Take 40 mg by mouth daily.   . ondansetron (ZOFRAN) 8 MG tablet Take 1 tablet (8 mg total) by mouth 2 (two) times daily as needed (Nausea or vomiting).   . OXYGEN Inhale 3 L into the lungs continuous.   . prochlorperazine (COMPAZINE) 10 MG tablet Take 1 tablet (10 mg total) by mouth every 6 (six) hours as needed (Nausea or vomiting).   . pyridoxine (B-6) 100 MG tablet Take 100 mg by mouth daily.    Marland Kitchen rOPINIRole (REQUIP) 0.5 MG tablet Take 0.5 mg by mouth 2 (two) times daily.    . sodium chloride (OCEAN) 0.65 % SOLN nasal spray Place 1 spray into both nostrils every 3 (three) hours as needed for congestion.    . traMADol (ULTRAM) 50 MG tablet Take 50 mg by mouth every 12 (twelve) hours as needed for moderate pain.  12/04/2017: Patient does not take often as it does not seem to help with pain  . zolpidem (AMBIEN) 5 MG tablet Take 5 mg by mouth at bedtime as needed for sleep.   . [DISCONTINUED] loratadine (CLARITIN) 10 MG tablet Take 10 mg by mouth daily.   . [DISCONTINUED] polyethylene glycol powder (GLYCOLAX/MIRALAX) powder Take 1 capful daily as needed. (Patient taking differently: Take  17 g by mouth daily as needed for mild constipation or moderate constipation. Take 1 capful daily as needed.)   . [DISCONTINUED] predniSONE (DELTASONE) 10 MG tablet Take 43m po daily for 2 days then 38mdaily for 2 days then 2066maily for 2 days then 53m30mily for 2 days then stop     ALLERGIES:  Allergies  Allergen Reactions  . Lisinopril Cough     PHYSICAL EXAM:  ECOG Performance status: 1-2 RN vitals reviewed  Physical Exam  Constitutional: She is oriented to person, place, and time and well-developed, well-nourished, and in no distress. Vital signs are normal. No distress.  oxygen dependent with portable oxygen   HENT:  Head: Normocephalic and atraumatic.  Mouth/Throat: Oropharynx is clear and moist. No oropharyngeal exudate.  Resolution of right neck mass. .  Eyes: Conjunctivae are normal. Pupils are equal, round, and reactive to light. No scleral icterus.  Neck: Normal range of motion. Neck supple. No JVD present.  Cardiovascular: Normal rate, regular rhythm and normal heart sounds. Exam reveals no gallop and no friction rub.  No murmur heard. Pulmonary/Chest: Effort normal. No respiratory distress. She has wheezes in the right upper field, the right middle field, the right lower field and the left upper field. She has rhonchi in the left lower field. She has no rales.  Abdominal: Soft. Bowel sounds are normal. She exhibits no distension. There is no tenderness. There is no rebound and no guarding.  Musculoskeletal: Normal range of motion. She exhibits no edema or  tenderness.  Lymphadenopathy:    She has no cervical adenopathy.  Neurological: She is alert and oriented to person, place, and time. No cranial nerve deficit.  Skin: Skin is warm and dry. No rash noted. No erythema. No pallor.  Psychiatric: Mood, memory, affect and judgment normal.  Nursing note and vitals reviewed.    LABORATORY DATA:  I have reviewed the labs as listed.  CBC    Component Value  Date/Time   WBC 7.0 12/04/2017 0523   RBC 3.78 (L) 12/04/2017 0523   HGB 11.0 (L) 12/04/2017 0523   HCT 36.0 12/04/2017 0523   PLT 187 12/04/2017 0523   MCV 95.2 12/04/2017 0523   MCH 29.1 12/04/2017 0523   MCHC 30.6 12/04/2017 0523   RDW 15.1 12/04/2017 0523   LYMPHSABS 0.6 (L) 12/04/2017 0523   MONOABS 0.1 12/04/2017 0523   EOSABS 0.0 12/04/2017 0523   BASOSABS 0.0 12/04/2017 0523   CMP Latest Ref Rng & Units 12/05/2017 12/05/2017 12/04/2017  Glucose 65 - 99 mg/dL 162(H) 222(H) 268(H)  BUN 6 - 20 mg/dL 25(H) 23(H) 21(H)  Creatinine 0.44 - 1.00 mg/dL 1.52(H) 1.53(H) 1.69(H)  Sodium 135 - 145 mmol/L 137 135 135  Potassium 3.5 - 5.1 mmol/L 4.6 5.4(H) 4.8  Chloride 101 - 111 mmol/L 102 100(L) 101  CO2 22 - 32 mmol/L _0 Calcium 8.9 - 10.3 mg/dL 9.8 9.6 8.9  Total Protein 6.5 - 8.1 g/dL - - -  Total Bilirubin 0.3 - 1.2 mg/dL - - -  Alkaline Phos 38 - 126 U/L - - -  AST 15 - 41 U/L - - -  ALT 14 - 54 U/L - - -    PENDING LABS:    DIAGNOSTIC IMAGING:  PET scan: 01/21/17     CT chest: 03/25/17      PATHOLOGY:     ASSESSMENT & PLAN:  1.Cancer Staging Adenocarcinoma of lung [facility]) Staging form: Lung, AJCC 7th Edition - Clinical: Stage IA - Signed by Baird Cancer, PA-C on 02/27/2014 - Pathologic stage from 04/26/2017: Stage IV (T2b(2), N3, M1a) - Signed by Baird Cancer, PA-C on 04/26/2017  PLAN: 1. Wheezes/Rhonchi auscultated/SOB with exertion/Decrease in O2 sats: STAT Chest X-ray. Showed continued stable radiographic appearance of the chest. No acute findings identified.  Patient is afebrile.  Vital signs stable.  Oxygen saturations 91%.  Patient's baseline is approximately 95% on 3 L. She does not appear to be in any distress and would like to proceed with treatment. 2. Labs and Chest X-ray Reviewed. Proceed with cycle 10 of tecentriq today. Continue q3 week tecentriq treatments; she is tolerating them very well without side effects.  3. Place dietary  consult.  Continue daily Glucerna.  Continue to push fluids. 4. RTC in 3 weeks for follow up and tecentriq treatment.  All questions were answered to patient's stated satisfaction. Encouraged patient to call with any new concerns or questions before her next visit to the cancer center and we can certain see her sooner, if needed.    Faythe Casa, NP 12/06/2017 10:06 AM

## 2017-11-28 ENCOUNTER — Encounter: Payer: Self-pay | Admitting: Oncology

## 2017-12-03 ENCOUNTER — Encounter (HOSPITAL_BASED_OUTPATIENT_CLINIC_OR_DEPARTMENT_OTHER): Payer: Medicare Other

## 2017-12-03 ENCOUNTER — Other Ambulatory Visit: Payer: Self-pay

## 2017-12-03 ENCOUNTER — Ambulatory Visit (HOSPITAL_COMMUNITY)
Admission: RE | Admit: 2017-12-03 | Discharge: 2017-12-03 | Disposition: A | Discharge status: Home / Self Care | Payer: Medicare Other | Source: Ambulatory Visit | Attending: Oncology | Admitting: Oncology

## 2017-12-03 ENCOUNTER — Encounter (HOSPITAL_COMMUNITY): Payer: Self-pay

## 2017-12-03 ENCOUNTER — Encounter (HOSPITAL_BASED_OUTPATIENT_CLINIC_OR_DEPARTMENT_OTHER): Payer: Medicare Other | Admitting: Oncology

## 2017-12-03 ENCOUNTER — Inpatient Hospital Stay (HOSPITAL_COMMUNITY)
Admission: EM | Admit: 2017-12-03 | Discharge: 2017-12-11 | DRG: 189 | Disposition: A | Discharge status: Home Health | Payer: Medicare Other | Attending: Internal Medicine | Admitting: Internal Medicine

## 2017-12-03 ENCOUNTER — Encounter (HOSPITAL_COMMUNITY): Payer: Medicare Other

## 2017-12-03 ENCOUNTER — Encounter (HOSPITAL_COMMUNITY): Payer: Self-pay | Admitting: Oncology

## 2017-12-03 VITALS — BP 142/53 | HR 88 | Temp 97.6°F | Resp 20

## 2017-12-03 VITALS — BP 121/51 | HR 88 | Resp 22 | Ht 64.0 in | Wt 177.0 lb

## 2017-12-03 DIAGNOSIS — C3490 Malignant neoplasm of unspecified part of unspecified bronchus or lung: Secondary | ICD-10-CM | POA: Diagnosis not present

## 2017-12-03 DIAGNOSIS — J9601 Acute respiratory failure with hypoxia: Secondary | ICD-10-CM

## 2017-12-03 DIAGNOSIS — Z9981 Dependence on supplemental oxygen: Secondary | ICD-10-CM

## 2017-12-03 DIAGNOSIS — Z87891 Personal history of nicotine dependence: Secondary | ICD-10-CM

## 2017-12-03 DIAGNOSIS — Z794 Long term (current) use of insulin: Secondary | ICD-10-CM

## 2017-12-03 DIAGNOSIS — C3412 Malignant neoplasm of upper lobe, left bronchus or lung: Secondary | ICD-10-CM | POA: Diagnosis not present

## 2017-12-03 DIAGNOSIS — Z7983 Long term (current) use of bisphosphonates: Secondary | ICD-10-CM

## 2017-12-03 DIAGNOSIS — I2510 Atherosclerotic heart disease of native coronary artery without angina pectoris: Secondary | ICD-10-CM | POA: Diagnosis not present

## 2017-12-03 DIAGNOSIS — R0989 Other specified symptoms and signs involving the circulatory and respiratory systems: Secondary | ICD-10-CM

## 2017-12-03 DIAGNOSIS — I10 Essential (primary) hypertension: Secondary | ICD-10-CM | POA: Diagnosis present

## 2017-12-03 DIAGNOSIS — I5032 Chronic diastolic (congestive) heart failure: Secondary | ICD-10-CM | POA: Diagnosis not present

## 2017-12-03 DIAGNOSIS — I130 Hypertensive heart and chronic kidney disease with heart failure and stage 1 through stage 4 chronic kidney disease, or unspecified chronic kidney disease: Secondary | ICD-10-CM | POA: Diagnosis present

## 2017-12-03 DIAGNOSIS — J9621 Acute and chronic respiratory failure with hypoxia: Principal | ICD-10-CM | POA: Diagnosis present

## 2017-12-03 DIAGNOSIS — I252 Old myocardial infarction: Secondary | ICD-10-CM

## 2017-12-03 DIAGNOSIS — G20 Parkinson's disease: Secondary | ICD-10-CM | POA: Diagnosis present

## 2017-12-03 DIAGNOSIS — Z79899 Other long term (current) drug therapy: Secondary | ICD-10-CM

## 2017-12-03 DIAGNOSIS — N183 Chronic kidney disease, stage 3 unspecified: Secondary | ICD-10-CM | POA: Diagnosis present

## 2017-12-03 DIAGNOSIS — R069 Unspecified abnormalities of breathing: Secondary | ICD-10-CM | POA: Diagnosis not present

## 2017-12-03 DIAGNOSIS — Z8249 Family history of ischemic heart disease and other diseases of the circulatory system: Secondary | ICD-10-CM | POA: Diagnosis not present

## 2017-12-03 DIAGNOSIS — C3431 Malignant neoplasm of lower lobe, right bronchus or lung: Secondary | ICD-10-CM

## 2017-12-03 DIAGNOSIS — Z902 Acquired absence of lung [part of]: Secondary | ICD-10-CM | POA: Diagnosis not present

## 2017-12-03 DIAGNOSIS — Z888 Allergy status to other drugs, medicaments and biological substances status: Secondary | ICD-10-CM

## 2017-12-03 DIAGNOSIS — Z833 Family history of diabetes mellitus: Secondary | ICD-10-CM

## 2017-12-03 DIAGNOSIS — M810 Age-related osteoporosis without current pathological fracture: Secondary | ICD-10-CM | POA: Diagnosis not present

## 2017-12-03 DIAGNOSIS — Z5112 Encounter for antineoplastic immunotherapy: Secondary | ICD-10-CM

## 2017-12-03 DIAGNOSIS — E785 Hyperlipidemia, unspecified: Secondary | ICD-10-CM | POA: Diagnosis present

## 2017-12-03 DIAGNOSIS — J441 Chronic obstructive pulmonary disease with (acute) exacerbation: Secondary | ICD-10-CM | POA: Diagnosis present

## 2017-12-03 DIAGNOSIS — E1165 Type 2 diabetes mellitus with hyperglycemia: Secondary | ICD-10-CM | POA: Diagnosis not present

## 2017-12-03 DIAGNOSIS — E119 Type 2 diabetes mellitus without complications: Secondary | ICD-10-CM

## 2017-12-03 DIAGNOSIS — R918 Other nonspecific abnormal finding of lung field: Secondary | ICD-10-CM

## 2017-12-03 DIAGNOSIS — Z7982 Long term (current) use of aspirin: Secondary | ICD-10-CM | POA: Diagnosis not present

## 2017-12-03 DIAGNOSIS — E869 Volume depletion, unspecified: Secondary | ICD-10-CM | POA: Diagnosis present

## 2017-12-03 DIAGNOSIS — E1122 Type 2 diabetes mellitus with diabetic chronic kidney disease: Secondary | ICD-10-CM | POA: Diagnosis present

## 2017-12-03 DIAGNOSIS — D631 Anemia in chronic kidney disease: Secondary | ICD-10-CM | POA: Diagnosis present

## 2017-12-03 DIAGNOSIS — R1310 Dysphagia, unspecified: Secondary | ICD-10-CM | POA: Diagnosis not present

## 2017-12-03 DIAGNOSIS — E860 Dehydration: Secondary | ICD-10-CM

## 2017-12-03 DIAGNOSIS — E875 Hyperkalemia: Secondary | ICD-10-CM | POA: Diagnosis not present

## 2017-12-03 DIAGNOSIS — J432 Centrilobular emphysema: Secondary | ICD-10-CM | POA: Diagnosis not present

## 2017-12-03 DIAGNOSIS — Z7189 Other specified counseling: Secondary | ICD-10-CM

## 2017-12-03 DIAGNOSIS — N179 Acute kidney failure, unspecified: Secondary | ICD-10-CM | POA: Diagnosis present

## 2017-12-03 DIAGNOSIS — J9600 Acute respiratory failure, unspecified whether with hypoxia or hypercapnia: Secondary | ICD-10-CM | POA: Diagnosis present

## 2017-12-03 DIAGNOSIS — R0602 Shortness of breath: Secondary | ICD-10-CM | POA: Diagnosis not present

## 2017-12-03 LAB — COMPREHENSIVE METABOLIC PANEL
ALT: 6 U/L — ABNORMAL LOW (ref 14–54)
AST: 28 U/L (ref 15–41)
Albumin: 3.7 g/dL (ref 3.5–5.0)
Alkaline Phosphatase: 64 U/L (ref 38–126)
Anion gap: 12 (ref 5–15)
BUN: 22 mg/dL — ABNORMAL HIGH (ref 6–20)
CO2: 27 mmol/L (ref 22–32)
Calcium: 9.4 mg/dL (ref 8.9–10.3)
Chloride: 95 mmol/L — ABNORMAL LOW (ref 101–111)
Creatinine, Ser: 1.73 mg/dL — ABNORMAL HIGH (ref 0.44–1.00)
GFR calc Af Amer: 31 mL/min — ABNORMAL LOW (ref >60)
GFR calc non Af Amer: 26 mL/min — ABNORMAL LOW (ref >60)
Glucose, Bld: 184 mg/dL — ABNORMAL HIGH (ref 65–99)
Potassium: 4.7 mmol/L (ref 3.5–5.1)
Sodium: 134 mmol/L — ABNORMAL LOW (ref 135–145)
Total Bilirubin: 0.5 mg/dL (ref 0.3–1.2)
Total Protein: 7.3 g/dL (ref 6.5–8.1)

## 2017-12-03 LAB — CBC WITH DIFFERENTIAL/PLATELET
Basophils Absolute: 0 10*3/uL (ref 0.0–0.1)
Basophils Relative: 1 %
Eosinophils Absolute: 0.4 10*3/uL (ref 0.0–0.7)
Eosinophils Relative: 6 %
HCT: 38.4 % (ref 36.0–46.0)
Hemoglobin: 11.6 g/dL — ABNORMAL LOW (ref 12.0–15.0)
Lymphocytes Relative: 21 %
Lymphs Abs: 1.7 10*3/uL (ref 0.7–4.0)
MCH: 29 pg (ref 26.0–34.0)
MCHC: 30.2 g/dL (ref 30.0–36.0)
MCV: 96 fL (ref 78.0–100.0)
Monocytes Absolute: 0.7 10*3/uL (ref 0.1–1.0)
Monocytes Relative: 8 %
Neutro Abs: 5.2 10*3/uL (ref 1.7–7.7)
Neutrophils Relative %: 64 %
Platelets: 200 10*3/uL (ref 150–400)
RBC: 4 MIL/uL (ref 3.87–5.11)
RDW: 15.3 % (ref 11.5–15.5)
WBC: 8 10*3/uL (ref 4.0–10.5)

## 2017-12-03 LAB — TSH: TSH: 1.254 u[IU]/mL (ref 0.350–4.500)

## 2017-12-03 MED ORDER — SODIUM CHLORIDE 0.9 % IV SOLN
INTRAVENOUS | Status: DC
  Administered 2017-12-03: 11:00:00 via INTRAVENOUS

## 2017-12-03 MED ORDER — PREDNISONE 50 MG PO TABS
60.0000 mg | ORAL_TABLET | Freq: Once | ORAL | Status: AC
  Administered 2017-12-03: 60 mg via ORAL
  Filled 2017-12-03: qty 1

## 2017-12-03 MED ORDER — ALBUTEROL SULFATE (2.5 MG/3ML) 0.083% IN NEBU
2.5000 mg | INHALATION_SOLUTION | Freq: Once | RESPIRATORY_TRACT | Status: AC
  Administered 2017-12-03: 2.5 mg via RESPIRATORY_TRACT
  Filled 2017-12-03: qty 3

## 2017-12-03 MED ORDER — SODIUM CHLORIDE 0.9 % IV SOLN
1200.0000 mg | Freq: Once | INTRAVENOUS | Status: AC
  Administered 2017-12-03: 1200 mg via INTRAVENOUS
  Filled 2017-12-03: qty 20

## 2017-12-03 MED ORDER — HEPARIN SOD (PORK) LOCK FLUSH 100 UNIT/ML IV SOLN
500.0000 [IU] | Freq: Once | INTRAVENOUS | Status: AC | PRN
  Administered 2017-12-03: 500 [IU]

## 2017-12-03 MED ORDER — IPRATROPIUM-ALBUTEROL 0.5-2.5 (3) MG/3ML IN SOLN
3.0000 mL | Freq: Once | RESPIRATORY_TRACT | Status: AC
  Administered 2017-12-03: 3 mL via RESPIRATORY_TRACT
  Filled 2017-12-03: qty 3

## 2017-12-03 MED ORDER — SODIUM CHLORIDE 0.9 % IV SOLN: Freq: Once | INTRAVENOUS | Status: DC

## 2017-12-03 NOTE — ED Notes (Signed)
EKG done and given to Dr Rogene Houston

## 2017-12-03 NOTE — ED Triage Notes (Signed)
Pt in from home via [place] with c/o sob at home 84 % room air.  Pt denies cp

## 2017-12-03 NOTE — ED Provider Notes (Signed)
[facility] EMERGENCY DEPARTMENT Provider Note   CSN: [id number] Arrival date & time: 12/03/17  2241     History   Chief Complaint Chief Complaint  Patient presents with  . Shortness of Breath    HPI Angela Lawson is a 83 y.o. female.  The history is provided by the patient and a relative.  Shortness of Breath  This is a new problem. The average episode lasts 1 day. The problem occurs frequently.The current episode started 12 to 24 hours ago. The problem has been gradually worsening. Associated symptoms include cough and wheezing. Pertinent negatives include no fever, no hemoptysis, no chest pain, no syncope, no vomiting and no leg swelling. She has tried nothing for the symptoms. She has had prior hospitalizations. She has had prior ED visits. Associated medical issues include COPD.  Patient with history of lung cancer, CHF, COPD, on home oxygen 3 L all the time She is currently undergoing therapy for her lung cancer, including the drug tecentriq She reports feeling worsening shortness of breath from COPD over the past 12-24 hours She normally can ambulate with her oxygen, but now she feels too short of breath on ambulation No fever, no chest pain, no hemoptysis. She is No longer smoking  Past Medical History:  Diagnosis Date  . Adenocarcinoma of lung ([place])    Left lung 2009, resected  . Anginal pain ([place])   . Arthritis   . Back pain   . CHF (congestive heart failure) ([place])   . COPD (chronic obstructive pulmonary disease) ([place])   . Diverticulitis   . Dyslipidemia   . Essential hypertension   . H/O ventral hernia   . Non-obstructive CAD    a. 04/2015 NSTEMI/Cath: LAD 10p, LCX 90m, RCA 94m, 20d, EF 35-40 w/ apical ballooning.  . On home O2    3L N/C  . Osteoporosis   . Osteoporosis 11/04/2015   Managed by Dr. Legrand Rams   . Parkinson's disease ([place])   . Shortness of breath   . Takotsubo cardiomyopathy    a. 04/2015 Echo: EF 45-50%, mid-dist anterior/apical/inferoapical HK  w/ hyperdynamic base. Gr 1 DD, mild AI, mild-mod MR, triv TR, PASP 35mmHg;  b. 04/2015 LV gram: Ef 35-40% w/ apical ballooning.  . Type II diabetes mellitus ([place])   . Ventricular bigeminy    a. 04/2015 in setting of NSTEMI/Takotsubo.    Patient Active Problem List   Diagnosis Date Noted  . Community acquired pneumonia of left lung 07/19/2017  . Critical lower limb ischemia 07/07/2017  . Counseling regarding advanced care planning and goals of care 05/15/2017  . Acute renal injury ([place]) 04/04/2017  . Chest pain 04/04/2017  . Nausea and vomiting 04/04/2017  . COPD with acute exacerbation ([place]) 04/25/2016  . Acute on chronic respiratory failure with hypoxia ([place]) 04/25/2016  . SOB (shortness of breath)   . Osteoporosis 11/04/2015  . Chronic diastolic CHF (congestive heart failure) ([place]) 08/01/2015  . CHF exacerbation ([place]) 07/09/2015  . Essential hypertension 04/19/2015  . Type II diabetes mellitus ([place]) 04/19/2015  . Dyslipidemia 04/19/2015  . History of lung cancer 04/19/2015  . Takotsubo cardiomyopathy 04/19/2015  . Ventricular bigeminy 04/19/2015  . NSTEMI (non-ST elevated myocardial infarction) ([place]) 04/19/2015  . Stress-induced cardiomyopathy 04/18/2015  . Elevated troponin 04/16/2015  . COPD exacerbation ([place]) 04/16/2015  . CKD (chronic kidney disease) stage 3, GFR 30-59 ml/min ([facility]) 04/16/2015  . Herpes genitalis in women 11/24/2014  . Toe fracture 09/28/2014  . Non-traumatic tear of right rotator  cuff 11/30/2013  . Pain in joint, shoulder region 11/30/2013  . Muscle weakness (generalized) 11/30/2013  . Bursitis of left shoulder 02/09/2013  . Adenocarcinoma of lung ([place]) 08/20/2012    Past Surgical History:  Procedure Laterality Date  . ABDOMINAL HYSTERECTOMY    . CARDIAC CATHETERIZATION N/A 04/17/2015   Procedure: Left Heart Cath and Coronary Angiography;  Surgeon: Troy Sine, MD;  Location: [place] CV LAB;  Service: Cardiovascular;  Laterality: N/A;  .  CHOLECYSTECTOMY    . COLONOSCOPY N/A 09/18/2014   Procedure: COLONOSCOPY;  Surgeon: Danie Binder, MD;  Location: [facility] ENDO SUITE;  Service: Endoscopy;  Laterality: N/A;  8:30 AM - moved to 10:30 Rosendo Gros to notify pt  . ECTOPIC PREGNANCY SURGERY    . INCISIONAL HERNIA REPAIR N/A 08/26/2013   Procedure: HERNIA REPAIR INCISIONAL WITH MESH;  Surgeon: Jamesetta So, MD;  Location: [facility] ORS;  Service: General;  Laterality: N/A;  . IR FLUORO GUIDE PORT INSERTION RIGHT  04/30/2017  . IR US GUIDE VASC ACCESS RIGHT  04/30/2017  . LUNG CANCER SURGERY    . VIDEO BRONCHOSCOPY WITH ENDOBRONCHIAL NAVIGATION N/A 04/23/2017   Procedure: VIDEO BRONCHOSCOPY WITH ENDOBRONCHIAL NAVIGATION;  Surgeon: Melrose Nakayama, MD;  Location: [facility];  Service: Thoracic;  Laterality: N/A;  . VIDEO BRONCHOSCOPY WITH ENDOBRONCHIAL ULTRASOUND N/A 04/23/2017   Procedure: VIDEO BRONCHOSCOPY WITH ENDOBRONCHIAL ULTRASOUND;  Surgeon: Melrose Nakayama, MD;  Location: [facility] OR;  Service: Thoracic;  Laterality: N/A;    OB History    No data available       Home Medications    Prior to Admission medications   Medication Sig Start Date End Date Taking? Authorizing Provider  albuterol (PROAIR HFA) 108 (90 BASE) MCG/ACT inhaler Inhale 2 puffs into the lungs every 4 (four) hours as needed for wheezing or shortness of breath.    [provider]  alendronate (FOSAMAX) 70 MG tablet Take 70 mg by mouth every Friday.     [provider]  aspirin EC 81 MG tablet Take 81 mg by mouth daily.    [provider]  Atezolizumab (TECENTRIQ IV) Inject into the vein. Every 3 weeks    [provider]  atorvastatin (LIPITOR) 40 MG tablet Take 40 mg by mouth at bedtime.     [provider]  buPROPion (WELLBUTRIN SR) 150 MG 12 hr tablet Take 150 mg by mouth 2 (two) times daily.    [provider]  cholecalciferol (VITAMIN D) 1000 units tablet Take 1,000 Units by mouth daily.    [provider]  clotrimazole-betamethasone (LOTRISONE) cream Apply 1 application topically 2 (two) times daily. 09/10/17   Holley Bouche, NP  furosemide (LASIX) 40 MG tablet Take 1 tablet (40 mg total) by mouth daily. Pt is able to take one additional tablet daily for weight increases of 3lbs in a day or 5lbs in a week. Resume on 10/20 09/23/17   Kathie Dike, MD  gabapentin (NEURONTIN) 300 MG capsule Take 600 mg by mouth at bedtime.     [provider]  Garlic 846 MG TABS Take 300 mg by mouth daily.    [provider]  guaiFENesin (MUCINEX) 600 MG 12 hr tablet Take 1 tablet (600 mg total) by mouth 2 (two) times daily. 09/23/17 09/23/18  Kathie Dike, MD  ipratropium-albuterol (DUONEB) 0.5-2.5 (3) MG/3ML SOLN Inhale 3 mLs into the lungs every 6 (six) hours as needed (for wheezing/shortness of breath).     [provider]  KLOR-CON 10 10 MEQ tablet TAKE 1 TABLET (10 MEQ TOTAL) BY MOUTH DAILY. *FURTHER REFILLS NEED TO BE AUTHORIZED BY PCP* 08/11/17   Lorretta Harp, MD  LANTUS SOLOSTAR 100 UNIT/ML Solostar Pen Inject 15 Units into the skin at bedtime.     [provider]  lidocaine-prilocaine (EMLA) cream Apply to affected area once 05/19/17   Brunetta Genera, MD  linagliptin (TRADJENTA) 5 MG TABS tablet Take 5 mg by mouth daily.    [provider]  loratadine (CLARITIN) 10 MG tablet Take 10 mg by mouth daily.    [provider]  meclizine (ANTIVERT) 25 MG tablet Take 25 mg by mouth daily.     [provider]  metoprolol succinate (TOPROL-XL) 25 MG 24 hr tablet Take 1 tablet (25 mg total) by mouth daily. 06/26/15   Jettie Booze, MD  nitroGLYCERIN (NITROSTAT) 0.4 MG SL tablet Place 0.4 mg under the tongue every 5 (five) minutes as needed for chest pain.    [provider]  Omega-3 Fatty Acids (FISH OIL PO) Take 1 capsule by mouth daily.    [provider]  omeprazole (PRILOSEC) 40 MG capsule Take 40 mg  by mouth daily.    [provider]  ondansetron (ZOFRAN) 8 MG tablet Take 1 tablet (8 mg total) by mouth 2 (two) times daily as needed (Nausea or vomiting). 05/19/17   Brunetta Genera, MD  OXYGEN Inhale 3 L into the lungs continuous.    [provider]  polyethylene glycol powder (GLYCOLAX/MIRALAX) powder Take 1 capful daily as needed. Patient taking differently: Take 17 g by mouth daily as needed for mild constipation or moderate constipation. Take 1 capful daily as needed. 06/01/17   Twana First, MD  predniSONE (DELTASONE) 10 MG tablet Take 40mg  po daily for 2 days then 30mg  daily for 2 days then 20mg  daily for 2 days then 10mg  daily for 2 days then stop 09/23/17   Kathie Dike, MD  prochlorperazine (COMPAZINE) 10 MG tablet Take 1 tablet (10 mg total) by mouth every 6 (six) hours as needed (Nausea or vomiting). 05/19/17   Brunetta Genera, MD  pyridoxine (B-6) 100 MG tablet Take 100 mg by mouth daily.     [provider]  rOPINIRole (REQUIP) 0.5 MG tablet Take 0.5 mg by mouth 2 (two) times daily.     [provider]  sodium chloride (OCEAN) 0.65 % SOLN nasal spray Place 1 spray into both nostrils every 3 (three) hours as needed for congestion.     [provider]  traMADol (ULTRAM) 50 MG tablet Take 50 mg by mouth every 12 (twelve) hours as needed for moderate pain.     [provider]  zolpidem (AMBIEN) 5 MG tablet Take 5 mg by mouth at bedtime as needed for sleep.    [provider]    Family History Family History  Problem Relation Age of Onset  . Diabetes Mother   . Hypertension Mother   . Diabetes Father   . Asthma Unknown   . Cancer Unknown   . Heart attack Sister        X2  . Colon cancer Neg Hx     Social History Social History   Tobacco Use  . Smoking status: Former Smoker    Years: 20.00    Types: Cigarettes    Last attempt to quit: 08/13/2006    Years since quitting: 11.3  . Smokeless tobacco:  Never Used  Substance Use Topics  . Alcohol use: No    Alcohol/week: 0.0 oz  . Drug use: No     Allergies   Lisinopril   Review of Systems Review of Systems  Constitutional: Negative for fever.  Respiratory: Positive for cough, shortness of breath and wheezing. Negative for hemoptysis.   Cardiovascular: Negative for chest pain, leg swelling and syncope.  Gastrointestinal: Negative for vomiting.  Neurological: Positive for tremors.  All other systems reviewed and are negative.    Physical Exam Updated Vital Signs BP (!) 178/69 (BP Location: Right Arm)   Pulse 79   Temp 98.3 F (36.8 C) (Oral)   Resp (!) 21   Ht 1.626 m (5\' 4" )   Wt 78 kg (172 lb)   SpO2 97%   BMI 29.52 kg/m   Physical Exam CONSTITUTIONAL: elderly, chronically ill appearing HEAD: Normocephalic/atraumatic EYES: EOMI ENMT: Mucous membranes moist NECK: supple no meningeal signs SPINE/BACK:entire spine nontender CV: S1/S2 noted, no murmurs/rubs/gallops noted LUNGS: Mild tachypnea noted, wheezing bilaterally, scattered crackles noted ABDOMEN: soft, nontender, no rebound or guarding, bowel sounds noted throughout abdomen GU:no cva tenderness NEURO: Pt is awake/alert/appropriate, moves all extremitiesx4.  No facial droop.   EXTREMITIES: pulses normal/equal, full ROM, no lower extremity edema SKIN: warm, color normal PSYCH: no abnormalities of mood noted, alert and oriented to situation   ED Treatments / Results  Labs (all labs ordered are listed, but only abnormal results are displayed) Labs Reviewed - No data to display  EKG  EKG Interpretation  Date/Time:  Thursday December 03 2017 22:53:04 EST Ventricular Rate:  81 PR Interval:    QRS Duration: 83 QT Interval:  385 QTC Calculation: 447 R Axis:   51 Text Interpretation:  Sinus rhythm RSR' in V1 or V2, right VCD or RVH Confirmed by Ripley Fraise [phone]) on 12/03/2017 11:37:11 PM       Radiology Dg Chest 2 View  Result Date:  12/03/2017 CLINICAL DATA:  83 year old female with increasing shortness of breath and wheezing. Chronic lung disease and left lung cancer. EXAM: CHEST  2 VIEW COMPARISON:  Restaging CT 10/08/2017 and earlier. FINDINGS: PA and lateral views. Stable right chest porta cath. Stable cardiac size and mediastinal contours. Chronic left lung volume loss, with reticular and indistinct left lung base opacity. Underlying large lung volumes and mild bilateral increased interstitial markings. No pleural effusion or new pulmonary opacity. Visualized tracheal air column is within normal limits. No acute osseous abnormality identified. Negative visible bowel gas pattern. IMPRESSION: Continued stable radiographic appearance of the chest. No acute findings identified. Electronically Signed   By: Genevie Ann M.D.   On: 12/03/2017 10:41    Procedures Procedures  CRITICAL CARE Performed by: Sharyon Cable Total critical care time: 33 minutes Critical care time was exclusive of separately billable procedures and treating other patients. Critical care was necessary to treat or prevent imminent or life-threatening deterioration. Critical care was time spent personally by me on the following activities: development of treatment plan with patient and/or surrogate as well as nursing, discussions with consultants, evaluation of patient's response to treatment, examination of patient, obtaining history from patient or surrogate, ordering and performing treatments and interventions, ordering and review of laboratory studies, ordering and review of radiographic studies, pulse oximetry and re-evaluation of patient's condition. Patient with COPD exacerbation, with acute respiratory failure, not responding to hour-long nebulized treatment will be admitted  Medications Ordered in ED Medications  ipratropium-albuterol (DUONEB) 0.5-2.5 (3) MG/3ML nebulizer solution 3 mL (3 mLs Nebulization  Given 12/03/17 2258)  albuterol (PROVENTIL)  (2.5 MG/3ML) 0.083% nebulizer solution 2.5 mg (2.5 mg Nebulization Given 12/03/17 2258)  predniSONE (DELTASONE) tablet 60 mg (60 mg Oral Given 12/03/17 2344)  albuterol (PROVENTIL,VENTOLIN) solution continuous neb (10 mg/hr Nebulization Given 12/04/17 0035)  sodium chloride 0.9 % bolus 1,000 mL (1,000 mLs Intravenous New Bag/Given 12/04/17 0236)     Initial Impression / Assessment and Plan / ED Course  I have reviewed the triage vital signs and the nursing notes.  Previous labs & imaging results that were available during my care of the patient were reviewed by me and considered in my medical decision making (see chart for details).     11:42 PM Patient with history of known COPD, history of lung cancer presents with worsening COPD exacerbation. At the time of my evaluation, she had already received a DuoNeb and  is now feeling improved She was seen in the oncology office earlier today, and had a chest x-ray and labs performed I have reviewed both labs and x-ray, we will not repeat at this time No pneumonia on chest x-ray, no pulmonary edema. White count was normal on CBC She had mild renal insufficiency on electrolyte panel  Plan to monitor in the ED, will likely need further neb treatments 12:47 AM Patient attempted to ambulate, but became tachypneic upon entering the bed Will give her an hour-long continuous nebulizer treatment 2:37 AM After hour-long therapy, she still became hypoxic and dyspneic on ambulation Will need to be admitted, and she requests IV fluids as well because she feels dehydrated  Discussed with Dr. Myna Hidalgo for admission Final Clinical Impressions(s) / ED Diagnoses   Final diagnoses:  COPD exacerbation ([place])  Acute respiratory failure with hypoxia [facility])    ED Discharge Orders    None       Ripley Fraise, MD 12/04/17 [phone]

## 2017-12-03 NOTE — Progress Notes (Signed)
Tolerated infusion w/o adverse reaction.  Alert, in no distress.  VSS.  Discharged via wheelchair in c/o family.  

## 2017-12-04 ENCOUNTER — Other Ambulatory Visit (HOSPITAL_COMMUNITY): Payer: Medicare Other

## 2017-12-04 ENCOUNTER — Encounter (HOSPITAL_COMMUNITY): Payer: Self-pay | Admitting: Family Medicine

## 2017-12-04 ENCOUNTER — Ambulatory Visit (HOSPITAL_COMMUNITY): Payer: Medicare Other

## 2017-12-04 DIAGNOSIS — E1122 Type 2 diabetes mellitus with diabetic chronic kidney disease: Secondary | ICD-10-CM | POA: Diagnosis present

## 2017-12-04 DIAGNOSIS — J9621 Acute and chronic respiratory failure with hypoxia: Secondary | ICD-10-CM | POA: Diagnosis not present

## 2017-12-04 DIAGNOSIS — Z8249 Family history of ischemic heart disease and other diseases of the circulatory system: Secondary | ICD-10-CM | POA: Diagnosis not present

## 2017-12-04 DIAGNOSIS — I10 Essential (primary) hypertension: Secondary | ICD-10-CM | POA: Diagnosis not present

## 2017-12-04 DIAGNOSIS — Z7982 Long term (current) use of aspirin: Secondary | ICD-10-CM | POA: Diagnosis not present

## 2017-12-04 DIAGNOSIS — M810 Age-related osteoporosis without current pathological fracture: Secondary | ICD-10-CM | POA: Diagnosis present

## 2017-12-04 DIAGNOSIS — I5032 Chronic diastolic (congestive) heart failure: Secondary | ICD-10-CM

## 2017-12-04 DIAGNOSIS — Z794 Long term (current) use of insulin: Secondary | ICD-10-CM

## 2017-12-04 DIAGNOSIS — Z9981 Dependence on supplemental oxygen: Secondary | ICD-10-CM | POA: Diagnosis not present

## 2017-12-04 DIAGNOSIS — I252 Old myocardial infarction: Secondary | ICD-10-CM | POA: Diagnosis not present

## 2017-12-04 DIAGNOSIS — E869 Volume depletion, unspecified: Secondary | ICD-10-CM | POA: Diagnosis present

## 2017-12-04 DIAGNOSIS — D631 Anemia in chronic kidney disease: Secondary | ICD-10-CM | POA: Diagnosis present

## 2017-12-04 DIAGNOSIS — J441 Chronic obstructive pulmonary disease with (acute) exacerbation: Secondary | ICD-10-CM | POA: Diagnosis not present

## 2017-12-04 DIAGNOSIS — C3490 Malignant neoplasm of unspecified part of unspecified bronchus or lung: Secondary | ICD-10-CM | POA: Diagnosis not present

## 2017-12-04 DIAGNOSIS — N179 Acute kidney failure, unspecified: Secondary | ICD-10-CM | POA: Diagnosis present

## 2017-12-04 DIAGNOSIS — R1310 Dysphagia, unspecified: Secondary | ICD-10-CM | POA: Diagnosis present

## 2017-12-04 DIAGNOSIS — E875 Hyperkalemia: Secondary | ICD-10-CM | POA: Diagnosis not present

## 2017-12-04 DIAGNOSIS — J432 Centrilobular emphysema: Secondary | ICD-10-CM | POA: Diagnosis present

## 2017-12-04 DIAGNOSIS — J9600 Acute respiratory failure, unspecified whether with hypoxia or hypercapnia: Secondary | ICD-10-CM | POA: Diagnosis present

## 2017-12-04 DIAGNOSIS — I2510 Atherosclerotic heart disease of native coronary artery without angina pectoris: Secondary | ICD-10-CM | POA: Diagnosis present

## 2017-12-04 DIAGNOSIS — N183 Chronic kidney disease, stage 3 (moderate): Secondary | ICD-10-CM | POA: Diagnosis not present

## 2017-12-04 DIAGNOSIS — E785 Hyperlipidemia, unspecified: Secondary | ICD-10-CM | POA: Diagnosis present

## 2017-12-04 DIAGNOSIS — Z87891 Personal history of nicotine dependence: Secondary | ICD-10-CM | POA: Diagnosis not present

## 2017-12-04 DIAGNOSIS — Z902 Acquired absence of lung [part of]: Secondary | ICD-10-CM | POA: Diagnosis not present

## 2017-12-04 DIAGNOSIS — I130 Hypertensive heart and chronic kidney disease with heart failure and stage 1 through stage 4 chronic kidney disease, or unspecified chronic kidney disease: Secondary | ICD-10-CM | POA: Diagnosis present

## 2017-12-04 DIAGNOSIS — R0602 Shortness of breath: Secondary | ICD-10-CM | POA: Diagnosis present

## 2017-12-04 DIAGNOSIS — Z833 Family history of diabetes mellitus: Secondary | ICD-10-CM | POA: Diagnosis not present

## 2017-12-04 DIAGNOSIS — R7989 Other specified abnormal findings of blood chemistry: Secondary | ICD-10-CM | POA: Diagnosis not present

## 2017-12-04 DIAGNOSIS — G20 Parkinson's disease: Secondary | ICD-10-CM | POA: Diagnosis present

## 2017-12-04 DIAGNOSIS — E1165 Type 2 diabetes mellitus with hyperglycemia: Secondary | ICD-10-CM | POA: Diagnosis present

## 2017-12-04 LAB — CBC WITH DIFFERENTIAL/PLATELET
Basophils Absolute: 0 10*3/uL (ref 0.0–0.1)
Basophils Relative: 0 %
Eosinophils Absolute: 0 10*3/uL (ref 0.0–0.7)
Eosinophils Relative: 0 %
HCT: 36 % (ref 36.0–46.0)
Hemoglobin: 11 g/dL — ABNORMAL LOW (ref 12.0–15.0)
Lymphocytes Relative: 8 %
Lymphs Abs: 0.6 10*3/uL — ABNORMAL LOW (ref 0.7–4.0)
MCH: 29.1 pg (ref 26.0–34.0)
MCHC: 30.6 g/dL (ref 30.0–36.0)
MCV: 95.2 fL (ref 78.0–100.0)
Monocytes Absolute: 0.1 10*3/uL (ref 0.1–1.0)
Monocytes Relative: 1 %
Neutro Abs: 6.3 10*3/uL (ref 1.7–7.7)
Neutrophils Relative %: 91 %
Platelets: 187 10*3/uL (ref 150–400)
RBC: 3.78 MIL/uL — ABNORMAL LOW (ref 3.87–5.11)
RDW: 15.1 % (ref 11.5–15.5)
WBC: 7 10*3/uL (ref 4.0–10.5)

## 2017-12-04 LAB — CBG MONITORING, ED
Glucose-Capillary: 252 mg/dL — ABNORMAL HIGH (ref 65–99)
Glucose-Capillary: 269 mg/dL — ABNORMAL HIGH (ref 65–99)
Glucose-Capillary: 195 mg/dL — ABNORMAL HIGH (ref 65–99)

## 2017-12-04 LAB — BASIC METABOLIC PANEL
Anion gap: 10 (ref 5–15)
BUN: 21 mg/dL — ABNORMAL HIGH (ref 6–20)
CO2: 24 mmol/L (ref 22–32)
Calcium: 8.9 mg/dL (ref 8.9–10.3)
Chloride: 101 mmol/L (ref 101–111)
Creatinine, Ser: 1.69 mg/dL — ABNORMAL HIGH (ref 0.44–1.00)
GFR calc Af Amer: 32 mL/min — ABNORMAL LOW (ref >60)
GFR calc non Af Amer: 27 mL/min — ABNORMAL LOW (ref >60)
Glucose, Bld: 268 mg/dL — ABNORMAL HIGH (ref 65–99)
Potassium: 4.8 mmol/L (ref 3.5–5.1)
Sodium: 135 mmol/L (ref 135–145)

## 2017-12-04 LAB — BLOOD GAS, ARTERIAL
Acid-base deficit: 0.1 mmol/L (ref 0.0–2.0)
Bicarbonate: 24.1 mmol/L (ref 20.0–28.0)
Drawn by: 23534
O2 Content: 3 L/min
O2 Saturation: 92.8 %
pCO2 arterial: 42.3 mmHg (ref 32.0–48.0)
pH, Arterial: 7.378 (ref 7.350–7.450)
pO2, Arterial: 69.9 mmHg — ABNORMAL LOW (ref 83.0–108.0)

## 2017-12-04 LAB — D-DIMER, QUANTITATIVE: D-Dimer, Quant: 1.17 ug/mL-FEU — ABNORMAL HIGH (ref 0.00–0.50)

## 2017-12-04 LAB — MRSA PCR SCREENING: MRSA by PCR: NEGATIVE

## 2017-12-04 LAB — GLUCOSE, CAPILLARY
Glucose-Capillary: 159 mg/dL — ABNORMAL HIGH (ref 65–99)
Glucose-Capillary: 188 mg/dL — ABNORMAL HIGH (ref 65–99)

## 2017-12-04 MED ORDER — ATORVASTATIN CALCIUM 40 MG PO TABS
40.0000 mg | ORAL_TABLET | Freq: Every day | ORAL | Status: DC
  Administered 2017-12-04 – 2017-12-10 (×7): 40 mg via ORAL
  Filled 2017-12-04 (×10): qty 1

## 2017-12-04 MED ORDER — SODIUM CHLORIDE 0.9 % IV SOLN: 250.0000 mL | INTRAVENOUS | Status: DC | PRN

## 2017-12-04 MED ORDER — VITAMIN B-6 50 MG PO TABS
100.0000 mg | ORAL_TABLET | Freq: Every day | ORAL | Status: DC
  Administered 2017-12-04 – 2017-12-11 (×8): 100 mg via ORAL
  Filled 2017-12-04: qty 1
  Filled 2017-12-04 (×4): qty 2
  Filled 2017-12-04: qty 1
  Filled 2017-12-04 (×3): qty 2
  Filled 2017-12-04 (×2): qty 1

## 2017-12-04 MED ORDER — ALBUTEROL (5 MG/ML) CONTINUOUS INHALATION SOLN
10.0000 mg/h | INHALATION_SOLUTION | Freq: Once | RESPIRATORY_TRACT | Status: AC
  Administered 2017-12-04: 10 mg/h via RESPIRATORY_TRACT
  Filled 2017-12-04: qty 20

## 2017-12-04 MED ORDER — LORATADINE 10 MG PO TABS
10.0000 mg | ORAL_TABLET | Freq: Every day | ORAL | Status: DC
  Administered 2017-12-04 – 2017-12-11 (×8): 10 mg via ORAL
  Filled 2017-12-04 (×8): qty 1

## 2017-12-04 MED ORDER — BUPROPION HCL ER (SR) 150 MG PO TB12
150.0000 mg | ORAL_TABLET | Freq: Two times a day (BID) | ORAL | Status: DC
  Administered 2017-12-04 – 2017-12-11 (×15): 150 mg via ORAL
  Filled 2017-12-04 (×26): qty 1

## 2017-12-04 MED ORDER — SALINE SPRAY 0.65 % NA SOLN
1.0000 | NASAL | Status: DC | PRN
  Filled 2017-12-04: qty 44

## 2017-12-04 MED ORDER — BISACODYL 5 MG PO TBEC: 5.0000 mg | DELAYED_RELEASE_TABLET | Freq: Every day | ORAL | Status: DC | PRN

## 2017-12-04 MED ORDER — METHYLPREDNISOLONE SODIUM SUCC 40 MG IJ SOLR
40.0000 mg | Freq: Three times a day (TID) | INTRAMUSCULAR | Status: DC
  Administered 2017-12-04 – 2017-12-05 (×4): 40 mg via INTRAVENOUS
  Filled 2017-12-04 (×4): qty 1

## 2017-12-04 MED ORDER — ACETAMINOPHEN 650 MG RE SUPP: 650.0000 mg | Freq: Four times a day (QID) | RECTAL | Status: DC | PRN

## 2017-12-04 MED ORDER — INSULIN ASPART 100 UNIT/ML ~~LOC~~ SOLN
0.0000 [IU] | Freq: Three times a day (TID) | SUBCUTANEOUS | Status: DC
  Administered 2017-12-04: 2 [IU] via SUBCUTANEOUS
  Administered 2017-12-04: 5 [IU] via SUBCUTANEOUS
  Administered 2017-12-04 – 2017-12-05 (×2): 2 [IU] via SUBCUTANEOUS
  Administered 2017-12-05: 1 [IU] via SUBCUTANEOUS
  Administered 2017-12-05: 2 [IU] via SUBCUTANEOUS
  Administered 2017-12-06: 1 [IU] via SUBCUTANEOUS
  Administered 2017-12-06: 5 [IU] via SUBCUTANEOUS
  Administered 2017-12-06: 2 [IU] via SUBCUTANEOUS
  Administered 2017-12-07 (×2): 1 [IU] via SUBCUTANEOUS
  Administered 2017-12-07: 5 [IU] via SUBCUTANEOUS
  Administered 2017-12-08 (×2): 3 [IU] via SUBCUTANEOUS
  Administered 2017-12-08: 2 [IU] via SUBCUTANEOUS
  Administered 2017-12-09: 7 [IU] via SUBCUTANEOUS
  Administered 2017-12-09: 5 [IU] via SUBCUTANEOUS
  Administered 2017-12-09: 2 [IU] via SUBCUTANEOUS
  Administered 2017-12-10: 5 [IU] via SUBCUTANEOUS
  Administered 2017-12-10 (×2): 2 [IU] via SUBCUTANEOUS
  Administered 2017-12-11: 7 [IU] via SUBCUTANEOUS
  Administered 2017-12-11: 2 [IU] via SUBCUTANEOUS
  Filled 2017-12-04: qty 1

## 2017-12-04 MED ORDER — SODIUM CHLORIDE 0.9 % IV BOLUS (SEPSIS)
1000.0000 mL | Freq: Once | INTRAVENOUS | Status: AC
  Administered 2017-12-04: 1000 mL via INTRAVENOUS

## 2017-12-04 MED ORDER — ALUM & MAG HYDROXIDE-SIMETH 200-200-20 MG/5ML PO SUSP
30.0000 mL | Freq: Four times a day (QID) | ORAL | Status: DC | PRN
  Administered 2017-12-04: 30 mL via ORAL
  Filled 2017-12-04: qty 30

## 2017-12-04 MED ORDER — GUAIFENESIN ER 600 MG PO TB12
600.0000 mg | ORAL_TABLET | Freq: Two times a day (BID) | ORAL | Status: DC
  Administered 2017-12-04 – 2017-12-07 (×7): 600 mg via ORAL
  Filled 2017-12-04 (×13): qty 1

## 2017-12-04 MED ORDER — PANTOPRAZOLE SODIUM 40 MG PO TBEC
40.0000 mg | DELAYED_RELEASE_TABLET | Freq: Every day | ORAL | Status: DC
  Administered 2017-12-04 – 2017-12-11 (×8): 40 mg via ORAL
  Filled 2017-12-04 (×8): qty 1

## 2017-12-04 MED ORDER — ALBUTEROL SULFATE (2.5 MG/3ML) 0.083% IN NEBU
2.5000 mg | INHALATION_SOLUTION | RESPIRATORY_TRACT | Status: DC | PRN
  Administered 2017-12-04 – 2017-12-06 (×4): 2.5 mg via RESPIRATORY_TRACT
  Filled 2017-12-04 (×4): qty 3

## 2017-12-04 MED ORDER — GABAPENTIN 300 MG PO CAPS
600.0000 mg | ORAL_CAPSULE | Freq: Every day | ORAL | Status: DC
  Administered 2017-12-04 – 2017-12-10 (×7): 600 mg via ORAL
  Filled 2017-12-04 (×7): qty 2

## 2017-12-04 MED ORDER — METOPROLOL SUCCINATE ER 25 MG PO TB24
25.0000 mg | ORAL_TABLET | Freq: Every day | ORAL | Status: DC
  Administered 2017-12-04 – 2017-12-11 (×8): 25 mg via ORAL
  Filled 2017-12-04 (×11): qty 1

## 2017-12-04 MED ORDER — SODIUM CHLORIDE 0.9% FLUSH
3.0000 mL | Freq: Two times a day (BID) | INTRAVENOUS | Status: DC
Start: 2017-12-04 — End: 2017-12-11
  Administered 2017-12-04 – 2017-12-11 (×14): 3 mL via INTRAVENOUS

## 2017-12-04 MED ORDER — MECLIZINE HCL 12.5 MG PO TABS: 25.0000 mg | ORAL_TABLET | Freq: Three times a day (TID) | ORAL | Status: DC | PRN

## 2017-12-04 MED ORDER — ACETAMINOPHEN 325 MG PO TABS
650.0000 mg | ORAL_TABLET | Freq: Four times a day (QID) | ORAL | Status: DC | PRN
  Administered 2017-12-04: 650 mg via ORAL
  Filled 2017-12-04 (×2): qty 2

## 2017-12-04 MED ORDER — HEPARIN SODIUM (PORCINE) 5000 UNIT/ML IJ SOLN
5000.0000 [IU] | Freq: Three times a day (TID) | INTRAMUSCULAR | Status: DC
  Administered 2017-12-04 – 2017-12-11 (×20): 5000 [IU] via SUBCUTANEOUS
  Filled 2017-12-04 (×21): qty 1

## 2017-12-04 MED ORDER — TRAMADOL HCL 50 MG PO TABS: 50.0000 mg | ORAL_TABLET | Freq: Four times a day (QID) | ORAL | Status: DC | PRN

## 2017-12-04 MED ORDER — ONDANSETRON HCL 4 MG/2ML IJ SOLN: 4.0000 mg | Freq: Four times a day (QID) | INTRAMUSCULAR | Status: DC | PRN

## 2017-12-04 MED ORDER — INSULIN GLARGINE 100 UNIT/ML ~~LOC~~ SOLN
15.0000 [IU] | Freq: Every day | SUBCUTANEOUS | Status: DC
  Administered 2017-12-04 – 2017-12-10 (×7): 15 [IU] via SUBCUTANEOUS
  Filled 2017-12-04 (×10): qty 0.15

## 2017-12-04 MED ORDER — VITAMIN D 1000 UNITS PO TABS
1000.0000 [IU] | ORAL_TABLET | Freq: Every day | ORAL | Status: DC
  Administered 2017-12-04 – 2017-12-11 (×8): 1000 [IU] via ORAL
  Filled 2017-12-04 (×11): qty 1

## 2017-12-04 MED ORDER — ZOLPIDEM TARTRATE 5 MG PO TABS
5.0000 mg | ORAL_TABLET | Freq: Every evening | ORAL | Status: DC | PRN
  Administered 2017-12-04 – 2017-12-10 (×7): 5 mg via ORAL
  Filled 2017-12-04 (×7): qty 1

## 2017-12-04 MED ORDER — GLUCERNA SHAKE PO LIQD
237.0000 mL | Freq: Three times a day (TID) | ORAL | Status: DC
  Administered 2017-12-04 – 2017-12-11 (×15): 237 mL via ORAL

## 2017-12-04 MED ORDER — INSULIN ASPART 100 UNIT/ML ~~LOC~~ SOLN
0.0000 [IU] | Freq: Every day | SUBCUTANEOUS | Status: DC
  Administered 2017-12-08 – 2017-12-09 (×2): 2 [IU] via SUBCUTANEOUS
  Administered 2017-12-10: 3 [IU] via SUBCUTANEOUS

## 2017-12-04 MED ORDER — ROPINIROLE HCL 0.25 MG PO TABS
0.5000 mg | ORAL_TABLET | Freq: Two times a day (BID) | ORAL | Status: DC
  Administered 2017-12-04 – 2017-12-11 (×14): 0.5 mg via ORAL
  Filled 2017-12-04: qty 1
  Filled 2017-12-04 (×2): qty 2
  Filled 2017-12-04: qty 1
  Filled 2017-12-04: qty 2
  Filled 2017-12-04: qty 1
  Filled 2017-12-04 (×4): qty 2
  Filled 2017-12-04: qty 1
  Filled 2017-12-04 (×2): qty 2
  Filled 2017-12-04: qty 1
  Filled 2017-12-04 (×2): qty 2
  Filled 2017-12-04 (×2): qty 1
  Filled 2017-12-04 (×7): qty 2

## 2017-12-04 MED ORDER — ASPIRIN EC 81 MG PO TBEC
81.0000 mg | DELAYED_RELEASE_TABLET | Freq: Every day | ORAL | Status: DC
  Administered 2017-12-04 – 2017-12-11 (×8): 81 mg via ORAL
  Filled 2017-12-04 (×8): qty 1

## 2017-12-04 MED ORDER — ONDANSETRON HCL 4 MG PO TABS: 4.0000 mg | ORAL_TABLET | Freq: Four times a day (QID) | ORAL | Status: DC | PRN

## 2017-12-04 MED ORDER — IPRATROPIUM-ALBUTEROL 0.5-2.5 (3) MG/3ML IN SOLN
3.0000 mL | Freq: Three times a day (TID) | RESPIRATORY_TRACT | Status: DC
  Administered 2017-12-04 – 2017-12-11 (×22): 3 mL via RESPIRATORY_TRACT
  Filled 2017-12-04 (×22): qty 3

## 2017-12-04 MED ORDER — INSULIN ASPART 100 UNIT/ML ~~LOC~~ SOLN
SUBCUTANEOUS | Status: AC
  Filled 2017-12-04: qty 1

## 2017-12-04 MED ORDER — SODIUM CHLORIDE 0.9% FLUSH
3.0000 mL | INTRAVENOUS | Status: DC | PRN
  Administered 2017-12-04 – 2017-12-09 (×2): 3 mL via INTRAVENOUS
  Filled 2017-12-04: qty 3

## 2017-12-04 MED ORDER — POLYETHYLENE GLYCOL 3350 17 G PO PACK: 17.0000 g | PACK | Freq: Every day | ORAL | Status: DC | PRN

## 2017-12-04 NOTE — ED Notes (Signed)
Pt assisted to the bedside commode

## 2017-12-04 NOTE — ED Notes (Signed)
Patient ambulated from room to desk. Patient 02 sat dropped to 79 percent on 3l of oxygen. Returned patient to room patient struggling to breath and catch her breath. 02 sat upon returning to room 88 percent. Called RN Claiborne Billings to room at this time. Patient back in bed resting still wheezing. 02 sat 90.

## 2017-12-04 NOTE — H&P (Signed)
History and Physical    Angela Lawson [id number] DOB: 08-06-1934 DOA: 12/03/2017  PCP: Rosita Fire, MD   Patient coming from: Home  Chief Complaint: SOB, cough, wheezing   HPI: Angela Lawson is a 83 y.o. female with medical history significant for COPD with chronic hypoxic respiratory failure, chronic diastolic CHF, recurrent non-small cell lung cancer undergoing chemotherapy, insulin-dependent diabetes mellitus, now presenting to the emergency department for evaluation of increased shortness of breath.  Patient reports that over the past 1-2 days, she has had progressive dyspnea with minimal exertion and secondary to this.  She denies any fevers or chest pain, and no LE edema or tenderness.  She received an infusion of Tecentriq yesterday morning.  ED Course: Upon arrival to the ED, patient is found to be afebrile, trading 90% on 3 L/min while at rest desaturating into the 70s with ambulation.  Blood pressure and heart rate are normal.  EKG features a sinus rhythm and chest x-ray is notable for chronic findings, but no acute disease.  Chemistry panel reveals a creatinine of 1.73, improved from earlier this month.  CBC she was given a liter of normal saline, DuoNeb, albuterol neb, 60 mg of oral prednisone, and then a 10 mg continuous albuterol treatment in the ED.  Continues to be dyspneic with chest shifting in the bed and will be admitted to the medical-surgical unit for ongoing evaluation and management of acute exacerbation in COPD with acute on chronic hypoxic respiratory failure.  Review of Systems:  All other systems reviewed and apart from HPI, are negative.  Past Medical History:  Diagnosis Date  . Adenocarcinoma of lung ([place])    Left lung 2009, resected  . Anginal pain ([place])   . Arthritis   . Back pain   . CHF (congestive heart failure) ([place])   . COPD (chronic obstructive pulmonary disease) ([place])   . Diverticulitis   . Dyslipidemia   . Essential hypertension   . H/O  ventral hernia   . Non-obstructive CAD    a. 04/2015 NSTEMI/Cath: LAD 10p, LCX 70m, RCA 45m, 20d, EF 35-40 w/ apical ballooning.  . On home O2    3L N/C  . Osteoporosis   . Osteoporosis 11/04/2015   Managed by Dr. Legrand Rams   . Parkinson's disease ([place])   . Shortness of breath   . Takotsubo cardiomyopathy    a. 04/2015 Echo: EF 45-50%, mid-dist anterior/apical/inferoapical HK w/ hyperdynamic base. Gr 1 DD, mild AI, mild-mod MR, triv TR, PASP 16mmHg;  b. 04/2015 LV gram: Ef 35-40% w/ apical ballooning.  . Type II diabetes mellitus ([place])   . Ventricular bigeminy    a. 04/2015 in setting of NSTEMI/Takotsubo.    Past Surgical History:  Procedure Laterality Date  . ABDOMINAL HYSTERECTOMY    . CARDIAC CATHETERIZATION N/A 04/17/2015   Procedure: Left Heart Cath and Coronary Angiography;  Surgeon: Troy Sine, MD;  Location: [facility];  Service: Cardiovascular;  Laterality: N/A;  . CHOLECYSTECTOMY    . COLONOSCOPY N/A 09/18/2014   Procedure: COLONOSCOPY;  Surgeon: Danie Binder, MD;  [facility];  Service: Endoscopy;  Laterality: N/A;  8:30 AM - moved to 10:30 Rosendo Gros to notify pt  . ECTOPIC PREGNANCY SURGERY    . INCISIONAL HERNIA REPAIR N/A 08/26/2013   Procedure: HERNIA REPAIR INCISIONAL WITH MESH;  Surgeon: Jamesetta So, MD;  Location: [facility];  Service: General;  Laterality: N/A;  . IR FLUORO GUIDE PORT INSERTION RIGHT  04/30/2017  . IR US GUIDE VASC ACCESS RIGHT  04/30/2017  . LUNG CANCER SURGERY    . VIDEO BRONCHOSCOPY WITH ENDOBRONCHIAL NAVIGATION N/A 04/23/2017   Procedure: VIDEO BRONCHOSCOPY WITH ENDOBRONCHIAL NAVIGATION;  Surgeon: Melrose Nakayama, MD;  Location: [facility];  Service: Thoracic;  Laterality: N/A;  . VIDEO BRONCHOSCOPY WITH ENDOBRONCHIAL ULTRASOUND N/A 04/23/2017   Procedure: VIDEO BRONCHOSCOPY WITH ENDOBRONCHIAL ULTRASOUND;  Surgeon: Melrose Nakayama, MD;  Location: [facility];  Service: Thoracic;  Laterality: N/A;     reports that she quit  smoking about 11 years ago. Her smoking use included cigarettes. She quit after 20.00 years of use. she has never used smokeless tobacco. She reports that she does not drink alcohol or use drugs.  Allergies  Allergen Reactions  . Lisinopril Cough    Family History  Problem Relation Age of Onset  . Diabetes Mother   . Hypertension Mother   . Diabetes Father   . Asthma Unknown   . Cancer Unknown   . Heart attack Sister        X2  . Colon cancer Neg Hx      Prior to Admission medications   Medication Sig Start Date End Date Taking? Authorizing Provider  albuterol (PROAIR HFA) 108 (90 BASE) MCG/ACT inhaler Inhale 2 puffs into the lungs every 4 (four) hours as needed for wheezing or shortness of breath.    [provider]  alendronate (FOSAMAX) 70 MG tablet Take 70 mg by mouth every Friday.     [provider]  aspirin EC 81 MG tablet Take 81 mg by mouth daily.    [provider]  Atezolizumab (TECENTRIQ IV) Inject into the vein. Every 3 weeks    [provider]  atorvastatin (LIPITOR) 40 MG tablet Take 40 mg by mouth at bedtime.     [provider]  buPROPion (WELLBUTRIN SR) 150 MG 12 hr tablet Take 150 mg by mouth 2 (two) times daily.    [provider]  cholecalciferol (VITAMIN D) 1000 units tablet Take 1,000 Units by mouth daily.    [provider]  clotrimazole-betamethasone (LOTRISONE) cream Apply 1 application topically 2 (two) times daily. 09/10/17   Holley Bouche, NP  furosemide (LASIX) 40 MG tablet Take 1 tablet (40 mg total) by mouth daily. Pt is able to take one additional tablet daily for weight increases of 3lbs in a day or 5lbs in a week. Resume on 10/20 09/23/17   Kathie Dike, MD  gabapentin (NEURONTIN) 300 MG capsule Take 600 mg by mouth at bedtime.     [provider]  Garlic 025 MG TABS Take 300 mg by mouth daily.    [provider]  guaiFENesin (MUCINEX) 600 MG 12 hr tablet Take 1  tablet (600 mg total) by mouth 2 (two) times daily. 09/23/17 09/23/18  Kathie Dike, MD  ipratropium-albuterol (DUONEB) 0.5-2.5 (3) MG/3ML SOLN Inhale 3 mLs into the lungs every 6 (six) hours as needed (for wheezing/shortness of breath).     [provider]  KLOR-CON 10 10 MEQ tablet TAKE 1 TABLET (10 MEQ TOTAL) BY MOUTH DAILY. *FURTHER REFILLS NEED TO BE AUTHORIZED BY PCP* 08/11/17   Lorretta Harp, MD  LANTUS SOLOSTAR 100 UNIT/ML Solostar Pen Inject 15 Units into the skin at bedtime.     [provider]  lidocaine-prilocaine (EMLA) cream Apply to affected area once 05/19/17   Brunetta Genera, MD  linagliptin (TRADJENTA) 5 MG TABS tablet Take 5 mg by  mouth daily.    [provider]  loratadine (CLARITIN) 10 MG tablet Take 10 mg by mouth daily.    [provider]  meclizine (ANTIVERT) 25 MG tablet Take 25 mg by mouth daily.     [provider]  metoprolol succinate (TOPROL-XL) 25 MG 24 hr tablet Take 1 tablet (25 mg total) by mouth daily. 06/26/15   Jettie Booze, MD  nitroGLYCERIN (NITROSTAT) 0.4 MG SL tablet Place 0.4 mg under the tongue every 5 (five) minutes as needed for chest pain.    [provider]  Omega-3 Fatty Acids (FISH OIL PO) Take 1 capsule by mouth daily.    [provider]  omeprazole (PRILOSEC) 40 MG capsule Take 40 mg by mouth daily.    [provider]  ondansetron (ZOFRAN) 8 MG tablet Take 1 tablet (8 mg total) by mouth 2 (two) times daily as needed (Nausea or vomiting). 05/19/17   Brunetta Genera, MD  OXYGEN Inhale 3 L into the lungs continuous.    [provider]  polyethylene glycol powder (GLYCOLAX/MIRALAX) powder Take 1 capful daily as needed. Patient taking differently: Take 17 g by mouth daily as needed for mild constipation or moderate constipation. Take 1 capful daily as needed. 06/01/17   Twana First, MD  predniSONE (DELTASONE) 10 MG tablet Take 40mg  po daily for 2 days  then 30mg  daily for 2 days then 20mg  daily for 2 days then 10mg  daily for 2 days then stop 09/23/17   Kathie Dike, MD  prochlorperazine (COMPAZINE) 10 MG tablet Take 1 tablet (10 mg total) by mouth every 6 (six) hours as needed (Nausea or vomiting). 05/19/17   Brunetta Genera, MD  pyridoxine (B-6) 100 MG tablet Take 100 mg by mouth daily.     [provider]  rOPINIRole (REQUIP) 0.5 MG tablet Take 0.5 mg by mouth 2 (two) times daily.     [provider]  sodium chloride (OCEAN) 0.65 % SOLN nasal spray Place 1 spray into both nostrils every 3 (three) hours as needed for congestion.     [provider]  traMADol (ULTRAM) 50 MG tablet Take 50 mg by mouth every 12 (twelve) hours as needed for moderate pain.     [provider]  zolpidem (AMBIEN) 5 MG tablet Take 5 mg by mouth at bedtime as needed for sleep.    [provider]    Physical Exam: Vitals:   12/04/17 0040 12/04/17 0100 12/04/17 0130 12/04/17 0200  BP:  (!) 136/52 139/67 (!) 143/57  Pulse:  79 83 88  Resp:  18 19 18   Temp:      TempSrc:      SpO2: 96% 100% 100% 95%  Weight:      Height:          Constitutional: NAD, calm  Eyes: PERTLA, lids and conjunctivae normal ENMT: Mucous membranes are moist. Posterior pharynx clear of any exudate or lesions.   Neck: normal, supple, no masses, no thyromegaly Respiratory: Diminished breath sounds bilaterally with occasional expiratory wheeze. No accessory muscle use.  Cardiovascular: S1 & S2 heard, regular rate and rhythm. No significant JVD. Abdomen: No distension, no tenderness, no masses palpated. Bowel sounds normal.  Musculoskeletal: no clubbing / cyanosis. No joint deformity upper and lower extremities.   Skin: no significant rashes, lesions, ulcers. Warm, dry, well-perfused. Neurologic: CN 2-12 grossly intact. Sensation intact. Strength 5/5 in all 4 limbs.  Psychiatric: Alert and oriented x 3. Pleasant, cooperative.  Labs  on Admission: I have personally reviewed following labs and imaging studies  CBC: Recent Labs  Lab 12/03/17 0859  WBC 8.0  NEUTROABS 5.2  HGB 11.6*  HCT 38.4  MCV 96.0  PLT 270   Basic Metabolic Panel: Recent Labs  Lab 12/03/17 0859  NA 134*  K 4.7  CL 95*  CO2 27  GLUCOSE 184*  BUN 22*  CREATININE 1.73*  CALCIUM 9.4   GFR: Estimated Creatinine Clearance: 25.8 mL/min (A) (by C-G formula based on SCr of 1.73 mg/dL (H)). Liver Function Tests: Recent Labs  Lab 12/03/17 0859  AST 28  ALT 6*  ALKPHOS 64  BILITOT 0.5  PROT 7.3  ALBUMIN 3.7   No results for input(s): LIPASE, AMYLASE in the last 168 hours. No results for input(s): AMMONIA in the last 168 hours. Coagulation Profile: No results for input(s): INR, PROTIME in the last 168 hours. Cardiac Enzymes: No results for input(s): CKTOTAL, CKMB, CKMBINDEX, TROPONINI in the last 168 hours. BNP (last 3 results) No results for input(s): PROBNP in the last 8760 hours. HbA1C: No results for input(s): HGBA1C in the last 72 hours. CBG: No results for input(s): GLUCAP in the last 168 hours. Lipid Profile: No results for input(s): CHOL, HDL, LDLCALC, TRIG, CHOLHDL, LDLDIRECT in the last 72 hours. Thyroid Function Tests: Recent Labs    12/03/17 0859  TSH 1.254   Anemia Panel: No results for input(s): VITAMINB12, FOLATE, FERRITIN, TIBC, IRON, RETICCTPCT in the last 72 hours. Urine analysis:    Component Value Date/Time   COLORURINE YELLOW 04/26/2017 2112   APPEARANCEUR CLEAR 04/26/2017 2112   LABSPEC 1.008 04/26/2017 2112   PHURINE 6.0 04/26/2017 2112   GLUCOSEU NEGATIVE 04/26/2017 2112   HGBUR NEGATIVE 04/26/2017 2112   BILIRUBINUR NEGATIVE 04/26/2017 2112   [place] NEGATIVE 04/26/2017 2112   PROTEINUR NEGATIVE 04/26/2017 2112   UROBILINOGEN 0.2 04/19/2012 1411   NITRITE NEGATIVE 04/26/2017 2112   LEUKOCYTESUR TRACE (A) 04/26/2017 2112   Sepsis Labs: @LABRCNTIP (procalcitonin:4,lacticidven:4) )No  results found for this or any previous visit (from the past 240 hour(s)).   Radiological Exams on Admission: Dg Chest 2 View  Result Date: 12/03/2017 CLINICAL DATA:  83 year old female with increasing shortness of breath and wheezing. Chronic lung disease and left lung cancer. EXAM: CHEST  2 VIEW COMPARISON:  Restaging CT 10/08/2017 and earlier. FINDINGS: PA and lateral views. Stable right chest porta cath. Stable cardiac size and mediastinal contours. Chronic left lung volume loss, with reticular and indistinct left lung base opacity. Underlying large lung volumes and mild bilateral increased interstitial markings. No pleural effusion or new pulmonary opacity. Visualized tracheal air column is within normal limits. No acute osseous abnormality identified. Negative visible bowel gas pattern. IMPRESSION: Continued stable radiographic appearance of the chest. No acute findings identified. Electronically Signed   By: Genevie Ann M.D.   On: 12/03/2017 10:41    EKG: Independently reviewed. Sinus rhythm, RSR' in V2.   Assessment/Plan  1. COPD with acute exacerbation; acute on chronic hypoxic respiratory failure - Presents with SOB and wheezing, found to be hypoxic in 70's with mild exertion on her usual 3 Lpm supplemental O2  - CXR clear, no fever or leukocytosis  - Treated with DuoNeb, 10 mg continuous albuterol neb, and steroids in ED  - Continue DuoNeb with prn albuterol nebs, continue systemic steroid, continue supplemental O2    2. NSCLC  - Underwent resection in 2009, but unfortunately suffered recurrence  - Follows with oncology, treated with Tecentriq, last  infusion on 12/03/17   3. CKD stage III  - SCr is 1.73, improved from 2 earlier this month, but up slightly from priors in 1.5 range  - Treated in ED with 1 liter NS  - Recently taken off of losartan  - Repeat chem panel in am    4. Insulin-dependent DM  - A1c was 7.0% in May 2018  - Managed at home with Lantus 15 units qHS and  Tradjenta  - Check CBG's, continue Lantus 15 units qHS, and start SSI with Novolog    5. Hypertension  - BP at goal  - Continue metoprolol    6. Chronic diastolic CHF  - Appears well-compensated  - Given a liter of NS in ED  - Hold Lasix initially, follow I/O's and daily wts, continue beta-blocker    DVT prophylaxis: Lovenox Code Status: Full  Family Communication: Discussed with patient Disposition Plan: Admit to med-surg Consults called: None Admission status: Inpatient    Vianne Bulls, MD [facility] Pager [phone]  If 7PM-7AM, please contact night-coverage [email or web address] Password [email or web address]  12/04/2017, 2:41 AM

## 2017-12-04 NOTE — ED Notes (Signed)
Pt sitting up eating and became very SOB.  sats on 3 liters 81%.  Pt visible respiratory distress.  Started breathing treatent and RT called.  Calling hospitalist.

## 2017-12-04 NOTE — ED Notes (Signed)
Attempted to ambulate patient. Prior to ambulation, pt saturations 90% on 3 liters.. With ambulation saturations 85-88%, by the time returning to room, saturations down 74-79%, pt with obvious dyspnea. Repositioned and encouraged slow breathing. Pt took several minutes to recover from activity. Now 88-90% on 3 liters. Dr. Christy Gentles made aware.

## 2017-12-04 NOTE — ED Notes (Signed)
ED Provider at bedside to discuss plan of care 

## 2017-12-04 NOTE — ED Notes (Signed)
In pt's room to introduce myself. Pt was watching tv. Pt advised that she'd be holding down in the ED until a bed was available upstairs.

## 2017-12-04 NOTE — Evaluation (Signed)
Physical Therapy Evaluation Patient Details Name: Angela Lawson MRN: [id number] DOB: 09-Mar-1934 Today's Date: 12/04/2017   History of Present Illness  Angela Lawson is a 83 y.o. female with medical history significant for COPD with chronic hypoxic respiratory failure, chronic diastolic CHF, recurrent non-small cell lung cancer undergoing chemotherapy, insulin-dependent diabetes mellitus, now presenting to the emergency department for evaluation of increased shortness of breath.  Patient reports that over the past 1-2 days, she has had progressive dyspnea with minimal exertion and secondary to this.  She denies any fevers or chest pain, and no LE edema or tenderness.  She received an infusion of Tecentriq yesterday morning.    Clinical Impression  Patient functioning near baseline for functional mobility/gait, limited mostly due to SOB with exertion, demonstrates slow labored movement when walking, becomes slower once SOB and put back to bed after therapy.  Patient will benefit from continued physical therapy in hospital and recommended venue below to increase strength, balance, endurance for safe ADLs and gait.    Follow Up Recommendations No PT follow up    Equipment Recommendations  None recommended by PT    Recommendations for Other Services       Precautions / Restrictions Precautions Precautions: Fall Restrictions Weight Bearing Restrictions: No      Mobility  Bed Mobility Overal bed mobility: Needs Assistance Bed Mobility: Supine to Sit;Sit to Supine     Supine to sit: Supervision Sit to supine: Supervision      Transfers Overall transfer level: Needs assistance Equipment used: Rolling walker (2 wheeled) Transfers: Sit to/from [facility] Sit to Stand: Min guard Stand pivot transfers: Min guard          Ambulation/Gait Ambulation/Gait assistance: Min guard Ambulation Distance (Feet): 30 Feet Assistive device: Rolling walker (2 wheeled) Gait  Pattern/deviations: Decreased step length - right;Decreased step length - left;Decreased stride length   Gait velocity interpretation: Below normal speed for age/gender General Gait Details: demonstrates slow labored cadence without loss of balance, limited mostly due to SOB, on 4 LPM with O2 sats dropping to 91% when back to bedside  Stairs            Wheelchair Mobility    Modified Rankin (Stroke Patients Only)       Balance Overall balance assessment: Needs assistance Sitting-balance support: No upper extremity supported;Feet supported Sitting balance-Leahy Scale: Good     Standing balance support: Bilateral upper extremity supported;During functional activity Standing balance-Leahy Scale: Fair                               Pertinent Vitals/Pain Pain Assessment: No/denies pain    Home Living Family/patient expects to be discharged to:: Private residence Living Arrangements: Children Available Help at Discharge: Family;Available 24 hours/day Type of Home: House Home Access: Ramped entrance     Home Layout: One level Home Equipment: Walker - 4 wheels;Wheelchair - Sport and exercise psychologist Comments: Home O2 dependent,  3 LPM    Prior Function Level of Independence: Independent with assistive device(s)               Hand Dominance        Extremity/Trunk Assessment   Upper Extremity Assessment Upper Extremity Assessment: Generalized weakness    Lower Extremity Assessment Lower Extremity Assessment: Generalized weakness    Cervical / Trunk Assessment Cervical / Trunk Assessment: Normal  Communication   Communication: No difficulties  Cognition Arousal/Alertness: Awake/alert Behavior During Therapy:  WFL for tasks assessed/performed Overall Cognitive Status: Within Functional Limits for tasks assessed                                        General Comments      Exercises     Assessment/Plan    PT  Assessment Patient needs continued PT services  PT Problem List Decreased strength;Decreased activity tolerance;Decreased balance;Decreased mobility;Cardiopulmonary status limiting activity       PT Treatment Interventions Gait training;Functional mobility training;Therapeutic activities;Therapeutic exercise;Patient/family education    PT Goals (Current goals can be found in the Care Plan section)  Acute Rehab PT Goals Patient Stated Goal: return home  PT Goal Formulation: With patient/family Time For Goal Achievement: 12/15/17 Potential to Achieve Goals: Good    Frequency Min 3X/week   Barriers to discharge        Co-evaluation               AM-PAC PT "6 Clicks" Daily Activity  Outcome Measure Difficulty turning over in bed (including adjusting bedclothes, sheets and blankets)?: None Difficulty moving from lying on back to sitting on the side of the bed? : None Difficulty sitting down on and standing up from a chair with arms (e.g., wheelchair, bedside commode, etc,.)?: A Little Help needed moving to and from a bed to chair (including a wheelchair)?: A Little Help needed walking in hospital room?: A Little Help needed climbing 3-5 steps with a railing? : A Lot 6 Click Score: 19    End of Session   Activity Tolerance: Patient tolerated treatment well;Patient limited by fatigue(Patient limited by SOB) Patient left: in bed;with call bell/phone within reach;with family/visitor present Nurse Communication: Mobility status PT Visit Diagnosis: Unsteadiness on feet (R26.81);Other abnormalities of gait and mobility (R26.89);Muscle weakness (generalized) (M62.81)    Time: 1610-9604 PT Time Calculation (min) (ACUTE ONLY): 24 min   Charges:   PT Evaluation $PT Eval Moderate Complexity: 1 Mod PT Treatments $Therapeutic Activity: 23-37 mins   PT G Codes:   PT G-Codes **NOT FOR INPATIENT CLASS** Functional Assessment Tool Used: AM-PAC 6 Clicks Basic Mobility Functional  Limitation: Mobility: Walking and moving around Mobility: Walking and Moving Around Current Status ([id number]): At least 20 percent but less than 40 percent impaired, limited or restricted Mobility: Walking and Moving Around Goal Status [phone]): At least 20 percent but less than 40 percent impaired, limited or restricted Mobility: Walking and Moving Around Discharge Status [phone]): At least 20 percent but less than 40 percent impaired, limited or restricted    3:47 PM, 12/04/17 Lonell Grandchild, MPT Physical Therapist with [facility] [phone] office [phone] mobile phone

## 2017-12-04 NOTE — Plan of Care (Signed)
  Acute Rehab PT Goals(only PT should resolve) Pt Will Go Supine/Side To Sit 12/04/2017 1548 - Progressing by Lonell Grandchild, PT Flowsheets Taken 12/04/2017 1548  Pt will go Supine/Side to Sit Independently Patient Will Transfer Sit To/From Stand 12/04/2017 1548 - Progressing by Lonell Grandchild, PT Flowsheets Taken 12/04/2017 1548  Patient will transfer sit to/from stand with modified independence Pt Will Transfer Bed To Chair/Chair To Bed 12/04/2017 1548 - Progressing by Lonell Grandchild, PT Flowsheets Taken 12/04/2017 1548  Pt will Transfer Bed to Chair/Chair to Bed with modified independence Pt Will Ambulate 12/04/2017 1548 - Progressing by Lonell Grandchild, PT Flowsheets Taken 12/04/2017 1548  Pt will Ambulate with supervision;50 feet;with rolling walker  3:48 PM, 12/04/17 Lonell Grandchild, MPT Physical Therapist with [facility] [phone] office [phone] mobile phone

## 2017-12-04 NOTE — ED Notes (Signed)
Patient assisted to bedside commode, assisted patient back to bed. Patient very wheezy and short of breath. Asking for a breathing treatment at this time. RN Hassan Rowan made aware while RN Cecille Rubin is on lunch break.

## 2017-12-04 NOTE — ED Notes (Signed)
Pt resting quietly with continuous neb going.  No distress at this time.

## 2017-12-04 NOTE — Progress Notes (Signed)
Angela Lawson is a 83 y.o. female with medical history significant for COPD with chronic hypoxic respiratory failure, chronic diastolic CHF, recurrent non-small cell lung cancer undergoing chemotherapy, insulin-dependent diabetes mellitus, now presenting to the emergency department for evaluation of increased shortness of breath. She was admitted for acute on chronic respiratory failure with hypoxia from copd exacerbation.    Plan:  1. Solumedrol IV 2. Duonebs.  3. [place] oxygen as needed to keep sats greater than 90%. 4. SSI.    Hosie Poisson, MD [phone]

## 2017-12-04 NOTE — ED Notes (Signed)
Pt taken off continuous neb.  No distress.  Drinking coffee without difficulty.

## 2017-12-04 NOTE — ED Notes (Signed)
sats 91%.  Breathing improved slightly.  Pt diminished breath sounds with some wheezing.

## 2017-12-04 NOTE — ED Notes (Signed)
Pt eating lunch tray.  Pt no distress.

## 2017-12-04 NOTE — Progress Notes (Signed)
Initial Nutrition Assessment  DOCUMENTATION CODES:      INTERVENTION:  Heart healthy/ CHO modified diet  Glucerna Shake po BID, each supplement provides 220 kcal and 10 grams of protein    NUTRITION DIAGNOSIS:   Increased nutrient needs related to chronic illness(shortness of breath- lung cancer and COPD) as evidenced by estimated needs, per patient/family report.  GOAL:   Patient will meet greater than or equal to 90% of their needs  MONITOR:   PO intake, Supplement acceptance, Weight trends, Labs  REASON FOR ASSESSMENT:   Consult Assessment of nutrition requirement/status, COPD Protocol  ASSESSMENT:  The patient presents with increased shortness of breath. The patient assessed by Oncology NP today related to new/recurrent adenocarcinoma of the left and right lung; h/o Stage IA NSCLC s/p resection in 2009. Her hx also includes DM-2, HTN, COPD and Arthritis.  Her home diet is regular. Today she did not eat breakfast because she didn't like what was sent to her. At lunch she ate salmon cake, carrots and some mashed potatoes and drank tea. She affirms good appetite but says she does drink Glucerna sometimes at home and takes omega -3 supplement.   She denies changes in intake or weight. Based on hospital records her weight is down 5lb in less than  1 month. Her weight has ranged between 80-82 kg for the past 1+ year.    Recent Labs  Lab 12/03/17 0859 12/04/17 0523  NA 134* 135  K 4.7 4.8  CL 95* 101  CO2 27 24  BUN 22* 21*  CREATININE 1.73* 1.69*  CALCIUM 9.4 8.9  GLUCOSE 184* 268*     NUTRITION - FOCUSED PHYSICAL EXAM: Mild temporal, clavicle muscle loss. No subcutaneous fat depletion.  Diet Order:  Diet heart healthy/carb modified Room service appropriate? Yes; Fluid consistency: Thin  EDUCATION NEEDS:  AddressedEducation needs have been addressed(Encouraged 5-6 small meals. Choose protein foods consistently such as eggs, milk, cheese, greek yogurt,meats,  fish, nuts and beans. drink adequate fluids. )  Skin:  Skin Assessment: Reviewed RN Assessment  Last BM:  12/28  Height:   Ht Readings from Last 1 Encounters:  12/03/17 5\' 4"  (1.626 m)    Weight:   Wt Readings from Last 1 Encounters:  12/03/17 172 lb (78 kg)    Ideal Body Weight:  55 kg  BMI:  Body mass index is 29.52 kg/m.  Estimated Nutritional Needs:   Kcal:  2956-2130  Protein:  90-95  Fluid:  >1.5 liters daily   Colman Cater MS,RD,CSG,LDN Office: [phone] Pager: [phone]

## 2017-12-04 NOTE — ED Notes (Signed)
Hospitalist at bedside.  Increased oxygen to 6 liters [place].  Goal sat >90%.   Plan to give continous neb and check ABG after.

## 2017-12-05 ENCOUNTER — Inpatient Hospital Stay (HOSPITAL_COMMUNITY): Payer: Medicare Other

## 2017-12-05 ENCOUNTER — Encounter (HOSPITAL_COMMUNITY): Payer: Self-pay

## 2017-12-05 LAB — BASIC METABOLIC PANEL
Anion gap: 10 (ref 5–15)
Anion gap: 11 (ref 5–15)
BUN: 23 mg/dL — ABNORMAL HIGH (ref 6–20)
BUN: 25 mg/dL — ABNORMAL HIGH (ref 6–20)
CO2: 25 mmol/L (ref 22–32)
CO2: 24 mmol/L (ref 22–32)
Calcium: 9.6 mg/dL (ref 8.9–10.3)
Calcium: 9.8 mg/dL (ref 8.9–10.3)
Chloride: 100 mmol/L — ABNORMAL LOW (ref 101–111)
Chloride: 102 mmol/L (ref 101–111)
Creatinine, Ser: 1.53 mg/dL — ABNORMAL HIGH (ref 0.44–1.00)
Creatinine, Ser: 1.52 mg/dL — ABNORMAL HIGH (ref 0.44–1.00)
GFR calc Af Amer: 36 mL/min — ABNORMAL LOW (ref >60)
GFR calc Af Amer: 36 mL/min — ABNORMAL LOW (ref >60)
GFR calc non Af Amer: 31 mL/min — ABNORMAL LOW (ref >60)
GFR calc non Af Amer: 31 mL/min — ABNORMAL LOW (ref >60)
Glucose, Bld: 222 mg/dL — ABNORMAL HIGH (ref 65–99)
Glucose, Bld: 162 mg/dL — ABNORMAL HIGH (ref 65–99)
Potassium: 5.4 mmol/L — ABNORMAL HIGH (ref 3.5–5.1)
Potassium: 4.6 mmol/L (ref 3.5–5.1)
Sodium: 135 mmol/L (ref 135–145)
Sodium: 137 mmol/L (ref 135–145)

## 2017-12-05 LAB — GLUCOSE, CAPILLARY
Glucose-Capillary: 182 mg/dL — ABNORMAL HIGH (ref 65–99)
Glucose-Capillary: 185 mg/dL — ABNORMAL HIGH (ref 65–99)
Glucose-Capillary: 150 mg/dL — ABNORMAL HIGH (ref 65–99)
Glucose-Capillary: 147 mg/dL — ABNORMAL HIGH (ref 65–99)

## 2017-12-05 MED ORDER — TECHNETIUM TC 99M DIETHYLENETRIAME-PENTAACETIC ACID
30.0000 | Freq: Once | INTRAVENOUS | Status: AC | PRN
  Administered 2017-12-05: 30 via RESPIRATORY_TRACT

## 2017-12-05 MED ORDER — METHYLPREDNISOLONE SODIUM SUCC 40 MG IJ SOLR
40.0000 mg | Freq: Two times a day (BID) | INTRAMUSCULAR | Status: DC
  Administered 2017-12-05 – 2017-12-07 (×4): 40 mg via INTRAVENOUS
  Filled 2017-12-05 (×4): qty 1

## 2017-12-05 MED ORDER — TECHNETIUM TO 99M ALBUMIN AGGREGATED
4.0000 | Freq: Once | INTRAVENOUS | Status: AC | PRN
  Administered 2017-12-05: 4 via INTRAVENOUS

## 2017-12-05 MED ORDER — SODIUM POLYSTYRENE SULFONATE 15 GM/60ML PO SUSP
30.0000 g | Freq: Once | ORAL | Status: AC
  Administered 2017-12-05: 30 g via ORAL
  Filled 2017-12-05: qty 120

## 2017-12-05 NOTE — Progress Notes (Signed)
Report called to Russ Halo, RN on Unit [address].  Patient to transfer to room 319.  Patient and family aware.

## 2017-12-05 NOTE — Progress Notes (Signed)
PROGRESS NOTE    Angela Lawson  MVH:[id number] DOB: Jun 02, 1934 DOA: 12/03/2017 PCP: Rosita Fire, MD    Brief Narrative: Angela Lawson a 82 y.o.femalewith medical history significant forCOPD with chronic hypoxic respiratory failure, chronic diastolic CHF, recurrent non-small cell lung cancer undergoing chemotherapy, insulin-dependent diabetes mellitus, now presenting to the emergency department for evaluation of increased shortness of breath. She was admitted for acute on chronic respiratory failure with hypoxia from copd exacerbation.     Assessment & Plan:   Principal Problem:   COPD with acute exacerbation ([place]) Active Problems:   Adenocarcinoma of lung (HCC)   CKD (chronic kidney disease) stage 3, GFR 30-59 ml/min (HCC)   Essential hypertension   Type II diabetes mellitus (HCC)   Chronic diastolic CHF (congestive heart failure) (HCC)   Acute on chronic respiratory failure with hypoxia (HCC)   Acute respiratory failure (HCC)   Acute respiratory failure with hypoxia secondary to acute on chronic COPD exacerbation Admitted for IV steroids continue with duo nebs.  Patient at baseline is on 3 L of nasal cannula oxygen at home currently she is at 4 L/min.  Her breathing has improved and on exam her wheezing has improved.  Taper Solu-Medrol to 40 mg every 12 hours.  Resume duo nebs and nasal cannula oxygen.   Hypertension well-controlled Non-small cell lung cancer Follow-up with oncology as an outpatient.    Elevated d-dimer VQ scan shows low probability for pulmonary embolism   Type 2 diabetes mellitus with hyperglycemia uncontrolled CBG (last 3)  Recent Labs    12/04/17 2152 12/05/17 0758 12/05/17 1138  GLUCAP 188* 182* 185*   Resume sensitive sliding scale    Hyperkalemia Asymptomatic.  1 dose of Kayexalate order.  Repeat potassium in the morning.  Stage III stage III CKD creatinine appears to be at baseline   Chronic diastolic heart failure She  appears to be compensated   Mild normocytic anemia probably secondary to anemia of chronic disease hemoglobin stable around 11.  DVT prophylaxis: Heparin Full code Code Status:  Family Communication: None at bedside Disposition Plan: We will discharge home tomorrow   Consultants:   None  Procedures: VQ scan e negative for PE Antimicrobials: None  Subjective: Breathing better cough is better  Objective: Vitals:   12/05/17 0700 12/05/17 0800 12/05/17 0807 12/05/17 0900  BP: (!) 133/57 (!) 172/77  (!) 142/59  Pulse: 73 84  89  Resp: 13 17  15   Temp:      TempSrc:      SpO2: 100% 97% 95% 97%  Weight:      Height:        Intake/Output Summary (Last 24 hours) at 12/05/2017 1351 Last data filed at 12/05/2017 1000 Gross per 24 hour  Intake 260 ml  Output 1 ml  Net 259 ml   Filed Weights   12/03/17 2249 12/05/17 0400  Weight: 78 kg (172 lb) 72.7 kg (160 lb 4.4 oz)    Examination:  General exam: Appears calm and comfortable  Respiratory system: Bilateral wheezing mostly posterior Cardiovascular system: S1 & S2 heard, RRR. No JVD, murmurs, rubs, gallops or clicks. No pedal edema. Gastrointestinal system: Abdomen is nondistended, soft and nontender. No organomegaly or masses felt. Normal bowel sounds heard. Central nervous system: Alert and oriented. No focal neurological deficits. Extremities: Symmetric 5 x 5 power. Skin: No rashes, lesions or ulcers Psychiatry: Judgement and insight appear normal. Mood & affect appropriate.     Data Reviewed: I have personally reviewed following  labs and imaging studies  CBC: Recent Labs  Lab 12/03/17 0859 12/04/17 0523  WBC 8.0 7.0  NEUTROABS 5.2 6.3  HGB 11.6* 11.0*  HCT 38.4 36.0  MCV 96.0 95.2  PLT 200 517   Basic Metabolic Panel: Recent Labs  Lab 12/03/17 0859 12/04/17 0523 12/05/17 0933  NA 134* 135 135  K 4.7 4.8 5.4*  CL 95* 101 100*  CO2 27 24 25   GLUCOSE 184* 268* 222*  BUN 22* 21* 23*  CREATININE  1.73* 1.69* 1.53*  CALCIUM 9.4 8.9 9.6   GFR: Estimated Creatinine Clearance: 28.2 mL/min (A) (by C-G formula based on SCr of 1.53 mg/dL (H)). Liver Function Tests: Recent Labs  Lab 12/03/17 0859  AST 28  ALT 6*  ALKPHOS 64  BILITOT 0.5  PROT 7.3  ALBUMIN 3.7   No results for input(s): LIPASE, AMYLASE in the last 168 hours. No results for input(s): AMMONIA in the last 168 hours. Coagulation Profile: No results for input(s): INR, PROTIME in the last 168 hours. Cardiac Enzymes: No results for input(s): CKTOTAL, CKMB, CKMBINDEX, TROPONINI in the last 168 hours. BNP (last 3 results) No results for input(s): PROBNP in the last 8760 hours. HbA1C: No results for input(s): HGBA1C in the last 72 hours. CBG: Recent Labs  Lab 12/04/17 1219 12/04/17 1625 12/04/17 2152 12/05/17 0758 12/05/17 1138  GLUCAP 195* 159* 188* 182* 185*   Lipid Profile: No results for input(s): CHOL, HDL, LDLCALC, TRIG, CHOLHDL, LDLDIRECT in the last 72 hours. Thyroid Function Tests: Recent Labs    12/03/17 0859  TSH 1.254   Anemia Panel: No results for input(s): VITAMINB12, FOLATE, FERRITIN, TIBC, IRON, RETICCTPCT in the last 72 hours. Sepsis Labs: No results for input(s): PROCALCITON, LATICACIDVEN in the last 168 hours.  Recent Results (from the past 240 hour(s))  MRSA PCR Screening     Status: None   Collection Time: 12/04/17  6:00 PM  Result Value Ref Range Status   MRSA by PCR NEGATIVE NEGATIVE Final    Comment:        The GeneXpert MRSA Assay (FDA approved for NASAL specimens only), is one component of a comprehensive MRSA colonization surveillance program. It is not intended to diagnose MRSA infection nor to guide or monitor treatment for MRSA infections.          Radiology Studies: Nm Pulmonary Perf And Vent  Result Date: 12/05/2017 CLINICAL DATA:  Positive D-dimer.  Pulmonary embolus suspected. EXAM: NUCLEAR MEDICINE VENTILATION - PERFUSION LUNG SCAN TECHNIQUE:  Ventilation images were obtained in multiple projections using inhaled aerosol Tc-63m DTPA. Perfusion images were obtained in multiple projections after intravenous injection of Tc-16m MAA. RADIOPHARMACEUTICALS:  30 mCi Technetium-81m DTPA aerosol inhalation and 4 mCi Technetium-68m MAA IV COMPARISON:  Chest x-ray 12/03/2017.  Chest CT 10/08/2017. FINDINGS: Ventilation: Limited assessment due to central aggregation of inhalational agent. Perfusion: No wedge shaped peripheral perfusion defects to suggest acute pulmonary embolism. Heterogeneous decreased perfusion is seen in the lung apices. IMPRESSION: Low probability for pulmonary embolus. Heterogeneously decreased perfusion in the lung apices is compatible with emphysema. Electronically Signed   By: Misty Stanley M.D.   On: 12/05/2017 11:46        Scheduled Meds: . aspirin EC  81 mg Oral Daily  . atorvastatin  40 mg Oral q1800  . buPROPion  150 mg Oral BID  . cholecalciferol  1,000 Units Oral Daily  . feeding supplement (GLUCERNA SHAKE)  237 mL Oral TID BM  . gabapentin  600 mg Oral  QHS  . guaiFENesin  600 mg Oral BID  . heparin  5,000 Units Subcutaneous Q8H  . insulin aspart  0-5 Units Subcutaneous QHS  . insulin aspart  0-9 Units Subcutaneous TID WC  . insulin glargine  15 Units Subcutaneous QHS  . ipratropium-albuterol  3 mL Inhalation TID  . loratadine  10 mg Oral Daily  . methylPREDNISolone (SOLU-MEDROL) injection  40 mg Intravenous Q8H  . metoprolol succinate  25 mg Oral Daily  . pantoprazole  40 mg Oral Daily  . pyridOXINE  100 mg Oral Daily  . rOPINIRole  0.5 mg Oral BID  . sodium chloride flush  3 mL Intravenous Q12H  . sodium polystyrene  30 g Oral Once   Continuous Infusions: . sodium chloride       LOS: 1 day    Time spent: 35 minutes.     Hosie Poisson, MD [facility] Pager [phone]  If 7PM-7AM, please contact night-coverage [email or web address] Password [email or web address] 12/05/2017, 1:51 PM

## 2017-12-06 ENCOUNTER — Other Ambulatory Visit: Payer: Self-pay

## 2017-12-06 LAB — GLUCOSE, CAPILLARY
Glucose-Capillary: 142 mg/dL — ABNORMAL HIGH (ref 65–99)
Glucose-Capillary: 251 mg/dL — ABNORMAL HIGH (ref 65–99)
Glucose-Capillary: 187 mg/dL — ABNORMAL HIGH (ref 65–99)
Glucose-Capillary: 195 mg/dL — ABNORMAL HIGH (ref 65–99)

## 2017-12-06 MED ORDER — CARBIDOPA-LEVODOPA 25-100 MG PO TABS
1.0000 | ORAL_TABLET | Freq: Two times a day (BID) | ORAL | Status: DC
  Administered 2017-12-06 – 2017-12-11 (×11): 1 via ORAL
  Filled 2017-12-06 (×11): qty 1

## 2017-12-06 NOTE — Progress Notes (Signed)
PROGRESS NOTE    Angela Lawson  FWY:[id number] DOB: 11-Dec-1933 DOA: 12/03/2017 PCP: Rosita Fire, MD    Brief Narrative: Angela Lawson a 83 y.o.femalewith medical history significant forCOPD with chronic hypoxic respiratory failure, chronic diastolic CHF, recurrent non-small cell lung cancer undergoing chemotherapy, insulin-dependent diabetes mellitus, now presenting to the emergency department for evaluation of increased shortness of breath. She was admitted for acute on chronic respiratory failure with hypoxia from copd exacerbation.     Assessment & Plan:   Principal Problem:   COPD with acute exacerbation ([place]) Active Problems:   Adenocarcinoma of lung (HCC)   CKD (chronic kidney disease) stage 3, GFR 30-59 ml/min (HCC)   Essential hypertension   Type II diabetes mellitus (HCC)   Chronic diastolic CHF (congestive heart failure) (HCC)   Acute on chronic respiratory failure with hypoxia (HCC)   Acute respiratory failure (HCC)   Acute respiratory failure with hypoxia secondary to acute on chronic COPD exacerbation Admitted for IV steroids continue with duo nebs.  Patient at baseline is on 3 L of nasal cannula oxygen at home currently she is at 4 L/min.  Her breathing has improved and on exam her wheezing has improved.  Tapering steroids.  She continues to require up to 4 L of nasal cannula oxygen, she reports she is still not at baseline still has coughing episodes..  Resume duo nebs and nasal cannula oxygen.  Monitor overnight on IV steroids and DuoNeb's.   Hypertension suboptimally controlled well-controlled Non-small cell lung cancer Follow-up with oncology as an outpatient.    Elevated d-dimer VQ scan shows low probability for pulmonary embolism discussed the results with the patient.   Type 2 diabetes mellitus with hyperglycemia uncontrolled CBG (last 3)  Recent Labs    12/05/17 2243 12/06/17 0748 12/06/17 1141  GLUCAP 147* 142* 251*   Resume sensitive  sliding scale.  No changes in medications.    Hyperkalemia Asymptomatic.  1 dose of Kayexalate order.  Repeat potassium in the morning is normal  Stage III stage III CKD creatinine appears to be at baseline no change in medications   Chronic diastolic heart failure She appears to be compensated   Mild normocytic anemia probably secondary to anemia of chronic disease hemoglobin stable around 11.  DVT prophylaxis: Heparin  Code Status: Full code Family Communication: None at bedside Disposition Plan: We will discharge home tomorrow if her breathing is at her baseline.   Consultants:   None  Procedures: VQ scan e negative for PE Antimicrobials: None  Subjective: Breathing and cough is slightly better but she is not back to baseline yet.  Objective: Vitals:   12/06/17 0534 12/06/17 0836 12/06/17 1401 12/06/17 1406  BP:    (!) 160/55  Pulse:    85  Resp:    18  Temp:    98.3 F (36.8 C)  TempSrc:      SpO2:  91% 97% 100%  Weight: 80.9 kg (178 lb 6.4 oz)     Height:        Intake/Output Summary (Last 24 hours) at 12/06/2017 1408 Last data filed at 12/06/2017 0900 Gross per 24 hour  Intake 240 ml  Output 2 ml  Net 238 ml   Filed Weights   12/03/17 2249 12/05/17 0400 12/06/17 0534  Weight: 78 kg (172 lb) 72.7 kg (160 lb 4.4 oz) 80.9 kg (178 lb 6.4 oz)    Examination:  General exam: Appears calm still requiring up to 4 L of nasal cannula oxygen.  Respiratory system: Scattered wheezing bilateral.  Air entry fair Cardiovascular system: S1 & S2 heard, RRR. No JVD,  No pedal edema. Gastrointestinal system: Soft, nontender, nondistended with good bowel sounds.  Organomegaly cannot be appreciated. Central nervous system: Alert and oriented.  Nonfocal  extremities: Symmetric 5 x 5 power.  No cyanosis or clubbing skin: No rashes, lesions or ulcers Psychiatry:  Mood & affect appropriate.     Data Reviewed: I have personally reviewed following labs and imaging  studies  CBC: Recent Labs  Lab 12/03/17 0859 12/04/17 0523  WBC 8.0 7.0  NEUTROABS 5.2 6.3  HGB 11.6* 11.0*  HCT 38.4 36.0  MCV 96.0 95.2  PLT 200 119   Basic Metabolic Panel: Recent Labs  Lab 12/03/17 0859 12/04/17 0523 12/05/17 0933 12/05/17 1423  NA 134* 135 135 137  K 4.7 4.8 5.4* 4.6  CL 95* 101 100* 102  CO2 27 24 25 24   GLUCOSE 184* 268* 222* 162*  BUN 22* 21* 23* 25*  CREATININE 1.73* 1.69* 1.53* 1.52*  CALCIUM 9.4 8.9 9.6 9.8   GFR: Estimated Creatinine Clearance: 29.9 mL/min (A) (by C-G formula based on SCr of 1.52 mg/dL (H)). Liver Function Tests: Recent Labs  Lab 12/03/17 0859  AST 28  ALT 6*  ALKPHOS 64  BILITOT 0.5  PROT 7.3  ALBUMIN 3.7   No results for input(s): LIPASE, AMYLASE in the last 168 hours. No results for input(s): AMMONIA in the last 168 hours. Coagulation Profile: No results for input(s): INR, PROTIME in the last 168 hours. Cardiac Enzymes: No results for input(s): CKTOTAL, CKMB, CKMBINDEX, TROPONINI in the last 168 hours. BNP (last 3 results) No results for input(s): PROBNP in the last 8760 hours. HbA1C: No results for input(s): HGBA1C in the last 72 hours. CBG: Recent Labs  Lab 12/05/17 1138 12/05/17 1642 12/05/17 2243 12/06/17 0748 12/06/17 1141  GLUCAP 185* 150* 147* 142* 251*   Lipid Profile: No results for input(s): CHOL, HDL, LDLCALC, TRIG, CHOLHDL, LDLDIRECT in the last 72 hours. Thyroid Function Tests: No results for input(s): TSH, T4TOTAL, FREET4, T3FREE, THYROIDAB in the last 72 hours. Anemia Panel: No results for input(s): VITAMINB12, FOLATE, FERRITIN, TIBC, IRON, RETICCTPCT in the last 72 hours. Sepsis Labs: No results for input(s): PROCALCITON, LATICACIDVEN in the last 168 hours.  Recent Results (from the past 240 hour(s))  MRSA PCR Screening     Status: None   Collection Time: 12/04/17  6:00 PM  Result Value Ref Range Status   MRSA by PCR NEGATIVE NEGATIVE Final    Comment:        The GeneXpert  MRSA Assay (FDA approved for NASAL specimens only), is one component of a comprehensive MRSA colonization surveillance program. It is not intended to diagnose MRSA infection nor to guide or monitor treatment for MRSA infections.          Radiology Studies: Nm Pulmonary Perf And Vent  Result Date: 12/05/2017 CLINICAL DATA:  Positive D-dimer.  Pulmonary embolus suspected. EXAM: NUCLEAR MEDICINE VENTILATION - PERFUSION LUNG SCAN TECHNIQUE: Ventilation images were obtained in multiple projections using inhaled aerosol Tc-50m DTPA. Perfusion images were obtained in multiple projections after intravenous injection of Tc-47m MAA. RADIOPHARMACEUTICALS:  30 mCi Technetium-30m DTPA aerosol inhalation and 4 mCi Technetium-41m MAA IV COMPARISON:  Chest x-ray 12/03/2017.  Chest CT 10/08/2017. FINDINGS: Ventilation: Limited assessment due to central aggregation of inhalational agent. Perfusion: No wedge shaped peripheral perfusion defects to suggest acute pulmonary embolism. Heterogeneous decreased perfusion is seen in the lung apices.  IMPRESSION: Low probability for pulmonary embolus. Heterogeneously decreased perfusion in the lung apices is compatible with emphysema. Electronically Signed   By: Misty Stanley M.D.   On: 12/05/2017 11:46        Scheduled Meds: . aspirin EC  81 mg Oral Daily  . atorvastatin  40 mg Oral q1800  . buPROPion  150 mg Oral BID  . carbidopa-levodopa  1 tablet Oral BID  . cholecalciferol  1,000 Units Oral Daily  . feeding supplement (GLUCERNA SHAKE)  237 mL Oral TID BM  . gabapentin  600 mg Oral QHS  . guaiFENesin  600 mg Oral BID  . heparin  5,000 Units Subcutaneous Q8H  . insulin aspart  0-5 Units Subcutaneous QHS  . insulin aspart  0-9 Units Subcutaneous TID WC  . insulin glargine  15 Units Subcutaneous QHS  . ipratropium-albuterol  3 mL Inhalation TID  . loratadine  10 mg Oral Daily  . methylPREDNISolone (SOLU-MEDROL) injection  40 mg Intravenous Q12H  .  metoprolol succinate  25 mg Oral Daily  . pantoprazole  40 mg Oral Daily  . pyridOXINE  100 mg Oral Daily  . rOPINIRole  0.5 mg Oral BID  . sodium chloride flush  3 mL Intravenous Q12H   Continuous Infusions: . sodium chloride       LOS: 2 days    Time spent: 35 minutes.     Hosie Poisson, MD [facility] Pager [phone]  If 7PM-7AM, please contact night-coverage [email or web address] Password [email or web address] 12/06/2017, 2:08 PM

## 2017-12-07 LAB — GLUCOSE, CAPILLARY
Glucose-Capillary: 146 mg/dL — ABNORMAL HIGH (ref 65–99)
Glucose-Capillary: 289 mg/dL — ABNORMAL HIGH (ref 65–99)
Glucose-Capillary: 135 mg/dL — ABNORMAL HIGH (ref 65–99)
Glucose-Capillary: 194 mg/dL — ABNORMAL HIGH (ref 65–99)

## 2017-12-07 MED ORDER — METHYLPREDNISOLONE SODIUM SUCC 125 MG IJ SOLR
60.0000 mg | Freq: Four times a day (QID) | INTRAMUSCULAR | Status: DC
  Administered 2017-12-07 – 2017-12-11 (×16): 60 mg via INTRAVENOUS
  Filled 2017-12-07 (×16): qty 2

## 2017-12-07 MED ORDER — FUROSEMIDE 40 MG PO TABS
40.0000 mg | ORAL_TABLET | Freq: Every day | ORAL | Status: DC
  Administered 2017-12-08 – 2017-12-11 (×4): 40 mg via ORAL
  Filled 2017-12-07 (×2): qty 1
  Filled 2017-12-07: qty 2
  Filled 2017-12-07: qty 1

## 2017-12-07 MED ORDER — BUDESONIDE 0.25 MG/2ML IN SUSP
0.2500 mg | Freq: Two times a day (BID) | RESPIRATORY_TRACT | Status: DC
  Administered 2017-12-07 – 2017-12-10 (×6): 0.25 mg via RESPIRATORY_TRACT
  Filled 2017-12-07 (×6): qty 2

## 2017-12-07 MED ORDER — GUAIFENESIN ER 600 MG PO TB12
1200.0000 mg | ORAL_TABLET | Freq: Two times a day (BID) | ORAL | Status: DC
  Administered 2017-12-07 – 2017-12-11 (×8): 1200 mg via ORAL
  Filled 2017-12-07 (×9): qty 2

## 2017-12-07 MED ORDER — DOXYCYCLINE HYCLATE 100 MG PO TABS
100.0000 mg | ORAL_TABLET | Freq: Two times a day (BID) | ORAL | Status: DC
  Administered 2017-12-07 – 2017-12-11 (×8): 100 mg via ORAL
  Filled 2017-12-07 (×8): qty 1

## 2017-12-07 NOTE — Care Management Note (Signed)
Case Management Note  Patient Details  Name: Angela Lawson MRN: [id number] Date of Birth: Aug 05, 1934      Admitted with COPD exacerbation. Pt is from home. She is very ind. She has home oxygen pta. She has PCP, transportation and no difficulty affording medications. PT has seen pt and recommends no follow up. Pt will be referred for [facility] transition calls. Pt aware. Pt active with tele-health monitoring through [facility]. No CM needs noted at this time.            Expected Discharge Date:   12/07/2017               Expected Discharge Plan:  Home/Self Care  In-House Referral:  NA  Discharge planning Services  CM Consult  Post Acute Care Choice:  NA Choice offered to:   NA  Status of Service:  Completed, signed off  Sherald Barge, RN 12/07/2017, 11:24 AM

## 2017-12-07 NOTE — Care Management Important Message (Signed)
Important Message  Patient Details  Name: Angela Lawson MRN: [id number] Date of Birth: [birth year]   Medicare Important Message Given:  Yes    Sherald Barge, RN 12/07/2017, 11:27 AM

## 2017-12-07 NOTE — Progress Notes (Signed)
PROGRESS NOTE    Angela Lawson  KXF:[id number] DOB: 09-10-1934 DOA: 12/03/2017 PCP: Rosita Fire, MD    Brief Narrative:  83 year old female with a history of COPD on home oxygen, non-small cell lung cancer undergoing chemotherapy, diabetes and chronic diastolic CHF, presents to the hospital with worsening shortness of breath, felt to be related to COPD exacerbation.  She is currently admitted for treatment including steroids, bronchodilators.  Her progress has been slow.   Assessment & Plan:   Principal Problem:   COPD with acute exacerbation ([place]) Active Problems:   Adenocarcinoma of lung (HCC)   CKD (chronic kidney disease) stage 3, GFR 30-59 ml/min (HCC)   Essential hypertension   Type II diabetes mellitus (HCC)   Chronic diastolic CHF (congestive heart failure) (HCC)   Acute on chronic respiratory failure with hypoxia (HCC)   Acute respiratory failure ([place])   1. Acute on chronic respiratory failure.  Patient is chronically on 3 L of oxygen.  Decompensation of respiratory status related to COPD exacerbation.  She is back down to 3 L, although respiratory effort does not appear to be back to baseline. 2. COPD exacerbation.  Continue on IV steroids.  Add inhaled steroids.  Continue on mucolytic's.  Will add oral doxycycline. 3. Elevated d-dimer.  VQ scan shows low probability for PE.  Likely reactive. 4. Hypertension.  Stable.  Continue current treatments. 5. Non-small cell lung cancer.  Follow-up with oncology as an outpatient 6. Diabetes.  Blood sugar stable.  Continue Lantus and sliding scale insulin. 7. Chronic kidney disease stage III.  Baseline creatinine around 1.3.  Currently, creatinine 1.5.  Continue to monitor. 8. Chronic diastolic congestive heart failure.  Appears to be compensated.  Resume on home dose of oral Lasix.   DVT prophylaxis: Heparin Code Status: Full code Family Communication: Discussed with family at the bedside Disposition Plan: Discharge home once  improved   Consultants:     Procedures:     Antimicrobials:   Doxycycline 12/31 >   Subjective: Still feels short of breath and not back to baseline.  Has nonproductive cough.  Objective: Vitals:   12/07/17 0556 12/07/17 1005 12/07/17 1451 12/07/17 1508  BP: (!) 154/64  (!) 153/71   Pulse: 65  68   Resp: 19  18   Temp: 98 F (36.7 C)  98.2 F (36.8 C)   TempSrc: Oral  Tympanic   SpO2: 100% 95% 95% 96%  Weight: 81.1 kg (178 lb 11.2 oz)     Height:        Intake/Output Summary (Last 24 hours) at 12/07/2017 1731 Last data filed at 12/07/2017 1700 Gross per 24 hour  Intake 720 ml  Output -  Net 720 ml   Filed Weights   12/05/17 0400 12/06/17 0534 12/07/17 0556  Weight: 72.7 kg (160 lb 4.4 oz) 80.9 kg (178 lb 6.4 oz) 81.1 kg (178 lb 11.2 oz)    Examination:  General exam: Appears calm and comfortable  Respiratory system: Mild wheezing upper airway.  Increased respiratory effort with conversation Cardiovascular system: S1 & S2 heard, RRR. No JVD, murmurs, rubs, gallops or clicks. No pedal edema. Gastrointestinal system: Abdomen is nondistended, soft and nontender. No organomegaly or masses felt. Normal bowel sounds heard. Central nervous system: Alert and oriented. No focal neurological deficits. Extremities: Symmetric 5 x 5 power. Skin: No rashes, lesions or ulcers Psychiatry: Judgement and insight appear normal. Mood & affect appropriate.     Data Reviewed: I have personally reviewed following labs and  imaging studies  CBC: Recent Labs  Lab 12/03/17 0859 12/04/17 0523  WBC 8.0 7.0  NEUTROABS 5.2 6.3  HGB 11.6* 11.0*  HCT 38.4 36.0  MCV 96.0 95.2  PLT 200 528   Basic Metabolic Panel: Recent Labs  Lab 12/03/17 0859 12/04/17 0523 12/05/17 0933 12/05/17 1423  NA 134* 135 135 137  K 4.7 4.8 5.4* 4.6  CL 95* 101 100* 102  CO2 27 24 25 24   GLUCOSE 184* 268* 222* 162*  BUN 22* 21* 23* 25*  CREATININE 1.73* 1.69* 1.53* 1.52*  CALCIUM 9.4 8.9  9.6 9.8   GFR: Estimated Creatinine Clearance: 29.9 mL/min (A) (by C-G formula based on SCr of 1.52 mg/dL (H)). Liver Function Tests: Recent Labs  Lab 12/03/17 0859  AST 28  ALT 6*  ALKPHOS 64  BILITOT 0.5  PROT 7.3  ALBUMIN 3.7   No results for input(s): LIPASE, AMYLASE in the last 168 hours. No results for input(s): AMMONIA in the last 168 hours. Coagulation Profile: No results for input(s): INR, PROTIME in the last 168 hours. Cardiac Enzymes: No results for input(s): CKTOTAL, CKMB, CKMBINDEX, TROPONINI in the last 168 hours. BNP (last 3 results) No results for input(s): PROBNP in the last 8760 hours. HbA1C: No results for input(s): HGBA1C in the last 72 hours. CBG: Recent Labs  Lab 12/06/17 1642 12/06/17 2056 12/07/17 0722 12/07/17 1111 12/07/17 1632  GLUCAP 187* 195* 146* 289* 135*   Lipid Profile: No results for input(s): CHOL, HDL, LDLCALC, TRIG, CHOLHDL, LDLDIRECT in the last 72 hours. Thyroid Function Tests: No results for input(s): TSH, T4TOTAL, FREET4, T3FREE, THYROIDAB in the last 72 hours. Anemia Panel: No results for input(s): VITAMINB12, FOLATE, FERRITIN, TIBC, IRON, RETICCTPCT in the last 72 hours. Sepsis Labs: No results for input(s): PROCALCITON, LATICACIDVEN in the last 168 hours.  Recent Results (from the past 240 hour(s))  MRSA PCR Screening     Status: None   Collection Time: 12/04/17  6:00 PM  Result Value Ref Range Status   MRSA by PCR NEGATIVE NEGATIVE Final    Comment:        The GeneXpert MRSA Assay (FDA approved for NASAL specimens only), is one component of a comprehensive MRSA colonization surveillance program. It is not intended to diagnose MRSA infection nor to guide or monitor treatment for MRSA infections.          Radiology Studies: No results found.      Scheduled Meds: . aspirin EC  81 mg Oral Daily  . atorvastatin  40 mg Oral q1800  . budesonide (PULMICORT) nebulizer solution  0.25 mg Nebulization BID  .  buPROPion  150 mg Oral BID  . carbidopa-levodopa  1 tablet Oral BID  . cholecalciferol  1,000 Units Oral Daily  . feeding supplement (GLUCERNA SHAKE)  237 mL Oral TID BM  . gabapentin  600 mg Oral QHS  . guaiFENesin  1,200 mg Oral BID  . heparin  5,000 Units Subcutaneous Q8H  . insulin aspart  0-5 Units Subcutaneous QHS  . insulin aspart  0-9 Units Subcutaneous TID WC  . insulin glargine  15 Units Subcutaneous QHS  . ipratropium-albuterol  3 mL Inhalation TID  . loratadine  10 mg Oral Daily  . methylPREDNISolone (SOLU-MEDROL) injection  60 mg Intravenous Q6H  . metoprolol succinate  25 mg Oral Daily  . pantoprazole  40 mg Oral Daily  . pyridOXINE  100 mg Oral Daily  . rOPINIRole  0.5 mg Oral BID  . sodium chloride  flush  3 mL Intravenous Q12H   Continuous Infusions: . sodium chloride       LOS: 3 days    Time spent: 46mins    Kathie Dike, MD [facility] Pager [phone]  If 7PM-7AM, please contact night-coverage [email or web address] Password [facility] 12/07/2017, 5:31 PM

## 2017-12-07 NOTE — Progress Notes (Signed)
OT Screen Note  Patient Details Name: TANAIRI CYPERT MRN: [id number] DOB: 10-Sep-1934   Cancelled Treatment:    Reason Eval/Treat Not Completed: OT screened, no needs identified, will sign off. Thank you for the referral.    Ailene Ravel, OTR/L,CBIS  [phone]  12/07/2017, 7:54 AM

## 2017-12-08 LAB — CBC
HCT: 35.8 % — ABNORMAL LOW (ref 36.0–46.0)
Hemoglobin: 11.1 g/dL — ABNORMAL LOW (ref 12.0–15.0)
MCH: 29.2 pg (ref 26.0–34.0)
MCHC: 31 g/dL (ref 30.0–36.0)
MCV: 94.2 fL (ref 78.0–100.0)
Platelets: 181 10*3/uL (ref 150–400)
RBC: 3.8 MIL/uL — ABNORMAL LOW (ref 3.87–5.11)
RDW: 15.2 % (ref 11.5–15.5)
WBC: 11.6 10*3/uL — ABNORMAL HIGH (ref 4.0–10.5)

## 2017-12-08 LAB — BASIC METABOLIC PANEL
Anion gap: 9 (ref 5–15)
BUN: 28 mg/dL — ABNORMAL HIGH (ref 6–20)
CO2: 24 mmol/L (ref 22–32)
Calcium: 8.7 mg/dL — ABNORMAL LOW (ref 8.9–10.3)
Chloride: 102 mmol/L (ref 101–111)
Creatinine, Ser: 1.34 mg/dL — ABNORMAL HIGH (ref 0.44–1.00)
GFR calc Af Amer: 42 mL/min — ABNORMAL LOW (ref >60)
GFR calc non Af Amer: 36 mL/min — ABNORMAL LOW (ref >60)
Glucose, Bld: 241 mg/dL — ABNORMAL HIGH (ref 65–99)
Potassium: 4.7 mmol/L (ref 3.5–5.1)
Sodium: 135 mmol/L (ref 135–145)

## 2017-12-08 LAB — GLUCOSE, CAPILLARY
Glucose-Capillary: 191 mg/dL — ABNORMAL HIGH (ref 65–99)
Glucose-Capillary: 212 mg/dL — ABNORMAL HIGH (ref 65–99)
Glucose-Capillary: 241 mg/dL — ABNORMAL HIGH (ref 65–99)
Glucose-Capillary: 235 mg/dL — ABNORMAL HIGH (ref 65–99)

## 2017-12-08 NOTE — Progress Notes (Signed)
PROGRESS NOTE    Angela Lawson  GQQ:[id number] DOB: [birth year] DOA: 12/03/2017 PCP: Rosita Fire, MD    Brief Narrative:  84 year old female with a history of COPD on home oxygen, non-small cell lung cancer undergoing chemotherapy, diabetes and chronic diastolic CHF, presents to the hospital with worsening shortness of breath, felt to be related to COPD exacerbation.  She is currently admitted for treatment including steroids, bronchodilators.  Her progress has been slow.   Assessment & Plan:   Principal Problem:   COPD with acute exacerbation ([place]) Active Problems:   Adenocarcinoma of lung (HCC)   CKD (chronic kidney disease) stage 3, GFR 30-59 ml/min (HCC)   Essential hypertension   Type II diabetes mellitus (HCC)   Chronic diastolic CHF (congestive heart failure) (HCC)   Acute on chronic respiratory failure with hypoxia (HCC)   Acute respiratory failure ([place])   1. Acute on chronic respiratory failure.  Patient is chronically on 3 L of oxygen.  Decompensation of respiratory status related to COPD exacerbation.  She is currently on 4 L, it appears that she may be approaching her baseline.  Continue treatments for another 24 hours. 2. COPD exacerbation.  Continue on IV steroids, inhaled steroids, mucolytic's and antibiotics 3. Elevated d-dimer.  VQ scan shows low probability for PE.  Likely reactive. 4. Hypertension.  Stable.  Continue current treatments. 5. Non-small cell lung cancer.  Follow-up with oncology as an outpatient 6. Diabetes.  Blood sugar stable.  Continue Lantus and sliding scale insulin. 7. Chronic kidney disease stage III.  Baseline creatinine around 1.3.  Currently, creatinine 1.5.  Continue to monitor. 8. Chronic diastolic congestive heart failure.  Appears to be compensated.  Resume on home dose of oral Lasix.   DVT prophylaxis: Heparin Code Status: Full code Family Communication: Discussed with family at the bedside Disposition Plan: Discharge home once  improved   Consultants:     Procedures:     Antimicrobials:   Doxycycline 12/31 >   Subjective: Continues to have nonproductive cough.  Feels that she is slowly improving, but not quite back to baseline.  Objective: Vitals:   12/08/17 0816 12/08/17 0824 12/08/17 1403 12/08/17 1440  BP:   (!) 175/56   Pulse:   93   Resp:   18   Temp:   98.8 F (37.1 C)   TempSrc:      SpO2: 98% 100% 95% 97%  Weight:      Height:        Intake/Output Summary (Last 24 hours) at 12/08/2017 1532 Last data filed at 12/08/2017 0900 Gross per 24 hour  Intake 480 ml  Output 400 ml  Net 80 ml   Filed Weights   12/06/17 0534 12/07/17 0556 12/08/17 0612  Weight: 80.9 kg (178 lb 6.4 oz) 81.1 kg (178 lb 11.2 oz) 81.5 kg (179 lb 10.8 oz)    Examination:  General exam: Appears calm and comfortable  Respiratory system: Clear bilaterally.  Still becomes short of breath during conversation. Cardiovascular system: S1 & S2 heard, RRR. No JVD, murmurs, rubs, gallops or clicks. No pedal edema. Gastrointestinal system: Abdomen is nondistended, soft and nontender. No organomegaly or masses felt. Normal bowel sounds heard. Central nervous system: Alert and oriented. No focal neurological deficits. Extremities: Symmetric 5 x 5 power. Skin: No rashes, lesions or ulcers Psychiatry: Judgement and insight appear normal. Mood & affect appropriate.     Data Reviewed: I have personally reviewed following labs and imaging studies  CBC: Recent Labs  Lab  12/03/17 0859 12/04/17 0523 12/08/17 0357  WBC 8.0 7.0 11.6*  NEUTROABS 5.2 6.3  --   HGB 11.6* 11.0* 11.1*  HCT 38.4 36.0 35.8*  MCV 96.0 95.2 94.2  PLT 200 187 397   Basic Metabolic Panel: Recent Labs  Lab 12/03/17 0859 12/04/17 0523 12/05/17 0933 12/05/17 1423 12/08/17 0357  NA 134* 135 135 137 135  K 4.7 4.8 5.4* 4.6 4.7  CL 95* 101 100* 102 102  CO2 27 24 25 24 24   GLUCOSE 184* 268* 222* 162* 241*  BUN 22* 21* 23* 25* 28*    CREATININE 1.73* 1.69* 1.53* 1.52* 1.34*  CALCIUM 9.4 8.9 9.6 9.8 8.7*   GFR: Estimated Creatinine Clearance: 34 mL/min (A) (by C-G formula based on SCr of 1.34 mg/dL (H)). Liver Function Tests: Recent Labs  Lab 12/03/17 0859  AST 28  ALT 6*  ALKPHOS 64  BILITOT 0.5  PROT 7.3  ALBUMIN 3.7   No results for input(s): LIPASE, AMYLASE in the last 168 hours. No results for input(s): AMMONIA in the last 168 hours. Coagulation Profile: No results for input(s): INR, PROTIME in the last 168 hours. Cardiac Enzymes: No results for input(s): CKTOTAL, CKMB, CKMBINDEX, TROPONINI in the last 168 hours. BNP (last 3 results) No results for input(s): PROBNP in the last 8760 hours. HbA1C: No results for input(s): HGBA1C in the last 72 hours. CBG: Recent Labs  Lab 12/07/17 1111 12/07/17 1632 12/07/17 2055 12/08/17 0733 12/08/17 1117  GLUCAP 289* 135* 194* 191* 212*   Lipid Profile: No results for input(s): CHOL, HDL, LDLCALC, TRIG, CHOLHDL, LDLDIRECT in the last 72 hours. Thyroid Function Tests: No results for input(s): TSH, T4TOTAL, FREET4, T3FREE, THYROIDAB in the last 72 hours. Anemia Panel: No results for input(s): VITAMINB12, FOLATE, FERRITIN, TIBC, IRON, RETICCTPCT in the last 72 hours. Sepsis Labs: No results for input(s): PROCALCITON, LATICACIDVEN in the last 168 hours.  Recent Results (from the past 240 hour(s))  MRSA PCR Screening     Status: None   Collection Time: 12/04/17  6:00 PM  Result Value Ref Range Status   MRSA by PCR NEGATIVE NEGATIVE Final    Comment:        The GeneXpert MRSA Assay (FDA approved for NASAL specimens only), is one component of a comprehensive MRSA colonization surveillance program. It is not intended to diagnose MRSA infection nor to guide or monitor treatment for MRSA infections.          Radiology Studies: No results found.      Scheduled Meds: . aspirin EC  81 mg Oral Daily  . atorvastatin  40 mg Oral q1800  .  budesonide (PULMICORT) nebulizer solution  0.25 mg Nebulization BID  . buPROPion  150 mg Oral BID  . carbidopa-levodopa  1 tablet Oral BID  . cholecalciferol  1,000 Units Oral Daily  . doxycycline  100 mg Oral Q12H  . feeding supplement (GLUCERNA SHAKE)  237 mL Oral TID BM  . furosemide  40 mg Oral Daily  . gabapentin  600 mg Oral QHS  . guaiFENesin  1,200 mg Oral BID  . heparin  5,000 Units Subcutaneous Q8H  . insulin aspart  0-5 Units Subcutaneous QHS  . insulin aspart  0-9 Units Subcutaneous TID WC  . insulin glargine  15 Units Subcutaneous QHS  . ipratropium-albuterol  3 mL Inhalation TID  . loratadine  10 mg Oral Daily  . methylPREDNISolone (SOLU-MEDROL) injection  60 mg Intravenous Q6H  . metoprolol succinate  25 mg Oral  Daily  . pantoprazole  40 mg Oral Daily  . pyridOXINE  100 mg Oral Daily  . rOPINIRole  0.5 mg Oral BID  . sodium chloride flush  3 mL Intravenous Q12H   Continuous Infusions: . sodium chloride       LOS: 4 days    Time spent: 4mins    Kathie Dike, MD [facility] Pager [phone]  If 7PM-7AM, please contact night-coverage [email or web address] Password [facility] 12/08/2017, 3:32 PM

## 2017-12-08 NOTE — Plan of Care (Signed)
Pt stated that her breathing has improved since yesterday

## 2017-12-09 LAB — GLUCOSE, CAPILLARY
Glucose-Capillary: 176 mg/dL — ABNORMAL HIGH (ref 65–99)
Glucose-Capillary: 338 mg/dL — ABNORMAL HIGH (ref 65–99)
Glucose-Capillary: 259 mg/dL — ABNORMAL HIGH (ref 65–99)
Glucose-Capillary: 232 mg/dL — ABNORMAL HIGH (ref 65–99)

## 2017-12-09 NOTE — Evaluation (Signed)
Clinical/Bedside Swallow Evaluation Patient Details  Name: PAMELA INTRIERI MRN: [id number] Date of Birth: [birth year]  Today's Date: 12/09/2017 Time: SLP Start Time (ACUTE ONLY): 1520 SLP Stop Time (ACUTE ONLY): 1541 SLP Time Calculation (min) (ACUTE ONLY): 21 min  Past Medical History:  Past Medical History:  Diagnosis Date  . Adenocarcinoma of lung ([place])    Left lung 2009, resected  . Anginal pain ([place])   . Arthritis   . Back pain   . CHF (congestive heart failure) ([place])   . COPD (chronic obstructive pulmonary disease) ([place])   . Diverticulitis   . Dyslipidemia   . Essential hypertension   . H/O ventral hernia   . Non-obstructive CAD    a. 04/2015 NSTEMI/Cath: LAD 10p, LCX 52m, RCA 67m, 20d, EF 35-40 w/ apical ballooning.  . On home O2    3L N/C  . Osteoporosis   . Osteoporosis 11/04/2015   Managed by Dr. Legrand Rams   . Parkinson's disease ([place])   . Shortness of breath   . Takotsubo cardiomyopathy    a. 04/2015 Echo: EF 45-50%, mid-dist anterior/apical/inferoapical HK w/ hyperdynamic base. Gr 1 DD, mild AI, mild-mod MR, triv TR, PASP 83mmHg;  b. 04/2015 LV gram: Ef 35-40% w/ apical ballooning.  . Type II diabetes mellitus ([place])   . Ventricular bigeminy    a. 04/2015 in setting of NSTEMI/Takotsubo.   Past Surgical History:  Past Surgical History:  Procedure Laterality Date  . ABDOMINAL HYSTERECTOMY    . CARDIAC CATHETERIZATION N/A 04/17/2015   Procedure: Left Heart Cath and Coronary Angiography;  Surgeon: Troy Sine, MD;  Location: [facility];  Service: Cardiovascular;  Laterality: N/A;  . CHOLECYSTECTOMY    . COLONOSCOPY N/A 09/18/2014   Procedure: COLONOSCOPY;  Surgeon: Danie Binder, MD;  Location: [facility];  Service: Endoscopy;  Laterality: N/A;  8:30 AM - moved to 10:30 Rosendo Gros to notify pt  . ECTOPIC PREGNANCY SURGERY    . INCISIONAL HERNIA REPAIR N/A 08/26/2013   Procedure: HERNIA REPAIR INCISIONAL WITH MESH;  Surgeon: Jamesetta So, MD;  Location: [facility] ORS;   Service: General;  Laterality: N/A;  . IR FLUORO GUIDE PORT INSERTION RIGHT  04/30/2017  . IR US GUIDE VASC ACCESS RIGHT  04/30/2017  . LUNG CANCER SURGERY    . VIDEO BRONCHOSCOPY WITH ENDOBRONCHIAL NAVIGATION N/A 04/23/2017   Procedure: VIDEO BRONCHOSCOPY WITH ENDOBRONCHIAL NAVIGATION;  Surgeon: Melrose Nakayama, MD;  Location: [facility];  Service: Thoracic;  Laterality: N/A;  . VIDEO BRONCHOSCOPY WITH ENDOBRONCHIAL ULTRASOUND N/A 04/23/2017   Procedure: VIDEO BRONCHOSCOPY WITH ENDOBRONCHIAL ULTRASOUND;  Surgeon: Melrose Nakayama, MD;  Location: [facility] OR;  Service: Thoracic;  Laterality: N/A;   HPI:  84 year old female with a history of COPD on home oxygen, non-small cell lung cancer undergoing chemotherapy, diabetes and chronic diastolic CHF, presents to the hospital with worsening shortness of breath, felt to be related to COPD exacerbation.  She is currently admitted for treatment including steroids, bronchodilators.  Her progress has been slow.   Assessment / Plan / Recommendation Clinical Impression  Pt seen at bedside for clinical swallow evaluation following a reported "choking" event during lunch today. The Pt is known to me from previous outpatient MBSS completed in March 2018. The MBSS was essentially unremarkable and Pt was cleared for a regular diet with thin liquids. Since that time, however she began treatment for left lung CA recurrence and had her most recent chemo on 12/03/2017. Her Oncology NP was concerned  about her breathing and ordered a stat chest xray. Pt went home, but was admitted to [facility] later that night with increasing SOB.  Pt without obvious signs or symptoms aspiration during today's clinical assessment, but did state that she felt a globus sensation in left neck and had an episode of "choking" on meat earlier today. Will go ahead and downgrade to D3/mech soft for tonight and f/u in the AM when her daughter is present to determine if repeat MBSS may be beneficial.  SLP  Visit Diagnosis: Dysphagia, oropharyngeal phase (R13.12)    Aspiration Risk  Mild aspiration risk    Diet Recommendation Dysphagia 3 (Mech soft);Thin liquid   Liquid Administration via: Cup;Straw Medication Administration: Whole meds with liquid Supervision: Patient able to self feed;Intermittent supervision to cue for compensatory strategies Compensations: Small sips/bites Postural Changes: Seated upright at 90 degrees;Remain upright for at least 30 minutes after po intake    Other  Recommendations Oral Care Recommendations: Oral care BID Other Recommendations: Clarify dietary restrictions   Follow up Recommendations None      Frequency and Duration min 2x/week  1 week       Prognosis Prognosis for Safe Diet Advancement: Good      Swallow Study   General Date of Onset: 12/03/17 HPI: 84 year old female with a history of COPD on home oxygen, non-small cell lung cancer undergoing chemotherapy, diabetes and chronic diastolic CHF, presents to the hospital with worsening shortness of breath, felt to be related to COPD exacerbation.  She is currently admitted for treatment including steroids, bronchodilators.  Her progress has been slow. Type of Study: Bedside Swallow Evaluation Previous Swallow Assessment: MBSS March 2018 Regular/thin Diet Prior to this Study: NPO Temperature Spikes Noted: No Respiratory Status: Nasal cannula History of Recent Intubation: No Behavior/Cognition: Alert;Cooperative;Pleasant mood Oral Cavity Assessment: Within Functional Limits Oral Care Completed by SLP: No Oral Cavity - Dentition: Adequate natural dentition Vision: Functional for self-feeding Self-Feeding Abilities: Able to feed self Patient Positioning: Upright in bed Baseline Vocal Quality: Normal Volitional Cough: Strong Volitional Swallow: Able to elicit    Oral/Motor/Sensory Function Overall Oral Motor/Sensory Function: Within functional limits   Ice Chips Ice chips: Within functional  limits Presentation: Spoon   Thin Liquid Thin Liquid: Within functional limits Presentation: Cup;Self Fed;Straw    Nectar Thick Nectar Thick Liquid: Not tested   Honey Thick Honey Thick Liquid: Not tested   Puree Puree: Within functional limits Presentation: Spoon;Self Fed   Solid   Thank you,  Genene Churn, CCC-SLP [phone]    Solid: Within functional limits Presentation: Self Fed        PORTER,DABNEY 12/09/2017,4:01 PM

## 2017-12-09 NOTE — Progress Notes (Signed)
Stated mech soft diet was good and she did not choke

## 2017-12-09 NOTE — Progress Notes (Signed)
Daughter approached the nursing station c/o that mom was choking. Upon my assessment when entering the  Room, patient was talking,and moving air,states " I have difficulty swallowing at times". Dr Roderic Palau notified, also informed patient's nurse Barbaraann Rondo. Patient stable at this time. Will continue to monitor patient.

## 2017-12-09 NOTE — Progress Notes (Signed)
Spoke with patient concerning aspiration and she said that she feels like food gets stuck in left side of throat.  Knows to not eat or drink until swallow eval.  In no current distress.  Daughter has been at bedside.

## 2017-12-09 NOTE — Progress Notes (Signed)
PROGRESS NOTE    Angela Lawson  ZYS:[id number] DOB: 02/28/1934 DOA: 12/03/2017 PCP: Rosita Fire, MD    Brief Narrative:  84 year old female with a history of COPD on home oxygen, non-small cell lung cancer undergoing chemotherapy, diabetes and chronic diastolic CHF, presents to the hospital with worsening shortness of breath, felt to be related to COPD exacerbation.  She is currently admitted for treatment including steroids, bronchodilators.  Her progress has been slow.  Developed episode of aspiration.  Speech therapy consulted.   Assessment & Plan:   Principal Problem:   COPD with acute exacerbation ([place]) Active Problems:   Adenocarcinoma of lung (HCC)   CKD (chronic kidney disease) stage 3, GFR 30-59 ml/min (HCC)   Essential hypertension   Type II diabetes mellitus (HCC)   Chronic diastolic CHF (congestive heart failure) (HCC)   Acute on chronic respiratory failure with hypoxia (HCC)   Acute respiratory failure ([place])   1. Acute on chronic respiratory failure.  Patient is chronically on 3 L of oxygen.  Decompensation of respiratory status related to COPD exacerbation.  She is currently on 4 L.  Try and wean down to baseline as tolerated 2. COPD exacerbation.  Continue on IV steroids, inhaled steroids, mucolytic's and antibiotics 3. Dysphasia.  Patient may be having episodes of aspiration.  She did choke on some food earlier today.  This could be prolonging her recovery.  Speech therapy has been consulted.  May need modified barium study. 4. Elevated d-dimer.  VQ scan shows low probability for PE.  Likely reactive. 5. Hypertension.  Stable.  Continue current treatments. 6. Non-small cell lung cancer.  Follow-up with oncology as an outpatient 7. Diabetes.  Blood sugar stable.  Continue Lantus and sliding scale insulin. 8. Chronic kidney disease stage III.  Baseline creatinine around 1.3.  Currently, creatinine 1.5.  Continue to monitor. 9. Chronic diastolic congestive heart  failure.  Appears to be compensated.  Continue on home dose of oral Lasix.   DVT prophylaxis: Heparin Code Status: Full code Family Communication: Discussed with family at the bedside Disposition Plan: Discharge home once improved   Consultants:     Procedures:     Antimicrobials:   Doxycycline 12/31 >   Subjective: Patient had a choking episode after trying to eat some meat earlier today.  Still feels somewhat short of breath.  Objective: Vitals:   12/09/17 0740 12/09/17 0744 12/09/17 1355 12/09/17 1414  BP:   (!) 161/64   Pulse:   75   Resp:   18   Temp:   98.4 F (36.9 C)   TempSrc:   Oral   SpO2: 96% 96% 99% 92%  Weight:      Height:        Intake/Output Summary (Last 24 hours) at 12/09/2017 1831 Last data filed at 12/09/2017 0900 Gross per 24 hour  Intake 240 ml  Output 500 ml  Net -260 ml   Filed Weights   12/07/17 0556 12/08/17 0612 12/09/17 0500  Weight: 81.1 kg (178 lb 11.2 oz) 81.5 kg (179 lb 10.8 oz) 80.4 kg (177 lb 4 oz)    Examination:  General exam: Appears calm and comfortable  Respiratory system: Rhonchi and mild wheeze in upper airways.  Still becomes short of breath during conversation. Cardiovascular system: S1 & S2 heard, RRR. No JVD, murmurs, rubs, gallops or clicks. No pedal edema. Gastrointestinal system: Abdomen is nondistended, soft and nontender. No organomegaly or masses felt. Normal bowel sounds heard. Central nervous system: Alert and oriented.  No focal neurological deficits. Extremities: Symmetric 5 x 5 power. Skin: No rashes, lesions or ulcers Psychiatry: Judgement and insight appear normal. Mood & affect appropriate.     Data Reviewed: I have personally reviewed following labs and imaging studies  CBC: Recent Labs  Lab 12/03/17 0859 12/04/17 0523 12/08/17 0357  WBC 8.0 7.0 11.6*  NEUTROABS 5.2 6.3  --   HGB 11.6* 11.0* 11.1*  HCT 38.4 36.0 35.8*  MCV 96.0 95.2 94.2  PLT 200 187 494   Basic Metabolic  Panel: Recent Labs  Lab 12/03/17 0859 12/04/17 0523 12/05/17 0933 12/05/17 1423 12/08/17 0357  NA 134* 135 135 137 135  K 4.7 4.8 5.4* 4.6 4.7  CL 95* 101 100* 102 102  CO2 27 24 25 24 24   GLUCOSE 184* 268* 222* 162* 241*  BUN 22* 21* 23* 25* 28*  CREATININE 1.73* 1.69* 1.53* 1.52* 1.34*  CALCIUM 9.4 8.9 9.6 9.8 8.7*   GFR: Estimated Creatinine Clearance: 33.8 mL/min (A) (by C-G formula based on SCr of 1.34 mg/dL (H)). Liver Function Tests: Recent Labs  Lab 12/03/17 0859  AST 28  ALT 6*  ALKPHOS 64  BILITOT 0.5  PROT 7.3  ALBUMIN 3.7   No results for input(s): LIPASE, AMYLASE in the last 168 hours. No results for input(s): AMMONIA in the last 168 hours. Coagulation Profile: No results for input(s): INR, PROTIME in the last 168 hours. Cardiac Enzymes: No results for input(s): CKTOTAL, CKMB, CKMBINDEX, TROPONINI in the last 168 hours. BNP (last 3 results) No results for input(s): PROBNP in the last 8760 hours. HbA1C: No results for input(s): HGBA1C in the last 72 hours. CBG: Recent Labs  Lab 12/08/17 1703 12/08/17 2051 12/09/17 0740 12/09/17 1143 12/09/17 1613  GLUCAP 241* 235* 176* 338* 259*   Lipid Profile: No results for input(s): CHOL, HDL, LDLCALC, TRIG, CHOLHDL, LDLDIRECT in the last 72 hours. Thyroid Function Tests: No results for input(s): TSH, T4TOTAL, FREET4, T3FREE, THYROIDAB in the last 72 hours. Anemia Panel: No results for input(s): VITAMINB12, FOLATE, FERRITIN, TIBC, IRON, RETICCTPCT in the last 72 hours. Sepsis Labs: No results for input(s): PROCALCITON, LATICACIDVEN in the last 168 hours.  Recent Results (from the past 240 hour(s))  MRSA PCR Screening     Status: None   Collection Time: 12/04/17  6:00 PM  Result Value Ref Range Status   MRSA by PCR NEGATIVE NEGATIVE Final    Comment:        The GeneXpert MRSA Assay (FDA approved for NASAL specimens only), is one component of a comprehensive MRSA colonization surveillance program. It  is not intended to diagnose MRSA infection nor to guide or monitor treatment for MRSA infections.          Radiology Studies: No results found.      Scheduled Meds: . aspirin EC  81 mg Oral Daily  . atorvastatin  40 mg Oral q1800  . budesonide (PULMICORT) nebulizer solution  0.25 mg Nebulization BID  . buPROPion  150 mg Oral BID  . carbidopa-levodopa  1 tablet Oral BID  . cholecalciferol  1,000 Units Oral Daily  . doxycycline  100 mg Oral Q12H  . feeding supplement (GLUCERNA SHAKE)  237 mL Oral TID BM  . furosemide  40 mg Oral Daily  . gabapentin  600 mg Oral QHS  . guaiFENesin  1,200 mg Oral BID  . heparin  5,000 Units Subcutaneous Q8H  . insulin aspart  0-5 Units Subcutaneous QHS  . insulin aspart  0-9 Units  Subcutaneous TID WC  . insulin glargine  15 Units Subcutaneous QHS  . ipratropium-albuterol  3 mL Inhalation TID  . loratadine  10 mg Oral Daily  . methylPREDNISolone (SOLU-MEDROL) injection  60 mg Intravenous Q6H  . metoprolol succinate  25 mg Oral Daily  . pantoprazole  40 mg Oral Daily  . pyridOXINE  100 mg Oral Daily  . rOPINIRole  0.5 mg Oral BID  . sodium chloride flush  3 mL Intravenous Q12H   Continuous Infusions: . sodium chloride       LOS: 5 days    Time spent: 24mins    Kathie Dike, MD [facility] Pager [phone]  If 7PM-7AM, please contact night-coverage [email or web address] Password [facility] 12/09/2017, 6:31 PM

## 2017-12-10 DIAGNOSIS — J441 Chronic obstructive pulmonary disease with (acute) exacerbation: Secondary | ICD-10-CM

## 2017-12-10 DIAGNOSIS — J9621 Acute and chronic respiratory failure with hypoxia: Principal | ICD-10-CM

## 2017-12-10 DIAGNOSIS — N183 Chronic kidney disease, stage 3 (moderate): Secondary | ICD-10-CM

## 2017-12-10 DIAGNOSIS — C3490 Malignant neoplasm of unspecified part of unspecified bronchus or lung: Secondary | ICD-10-CM

## 2017-12-10 DIAGNOSIS — I10 Essential (primary) hypertension: Secondary | ICD-10-CM

## 2017-12-10 LAB — BASIC METABOLIC PANEL
Anion gap: 10 (ref 5–15)
BUN: 35 mg/dL — ABNORMAL HIGH (ref 6–20)
CO2: 28 mmol/L (ref 22–32)
Calcium: 8.9 mg/dL (ref 8.9–10.3)
Chloride: 101 mmol/L (ref 101–111)
Creatinine, Ser: 1.45 mg/dL — ABNORMAL HIGH (ref 0.44–1.00)
GFR calc Af Amer: 38 mL/min — ABNORMAL LOW (ref >60)
GFR calc non Af Amer: 33 mL/min — ABNORMAL LOW (ref >60)
Glucose, Bld: 215 mg/dL — ABNORMAL HIGH (ref 65–99)
Potassium: 4.4 mmol/L (ref 3.5–5.1)
Sodium: 139 mmol/L (ref 135–145)

## 2017-12-10 LAB — CBC
HCT: 38.7 % (ref 36.0–46.0)
Hemoglobin: 11.9 g/dL — ABNORMAL LOW (ref 12.0–15.0)
MCH: 28.5 pg (ref 26.0–34.0)
MCHC: 30.7 g/dL (ref 30.0–36.0)
MCV: 92.8 fL (ref 78.0–100.0)
Platelets: 199 10*3/uL (ref 150–400)
RBC: 4.17 MIL/uL (ref 3.87–5.11)
RDW: 15.3 % (ref 11.5–15.5)
WBC: 18 10*3/uL — ABNORMAL HIGH (ref 4.0–10.5)

## 2017-12-10 LAB — GLUCOSE, CAPILLARY
Glucose-Capillary: 180 mg/dL — ABNORMAL HIGH (ref 65–99)
Glucose-Capillary: 257 mg/dL — ABNORMAL HIGH (ref 65–99)
Glucose-Capillary: 280 mg/dL — ABNORMAL HIGH (ref 65–99)
Glucose-Capillary: 278 mg/dL — ABNORMAL HIGH (ref 65–99)

## 2017-12-10 MED ORDER — BUDESONIDE 0.5 MG/2ML IN SUSP
0.5000 mg | Freq: Two times a day (BID) | RESPIRATORY_TRACT | Status: DC
  Administered 2017-12-10 – 2017-12-11 (×2): 0.5 mg via RESPIRATORY_TRACT
  Filled 2017-12-10 (×2): qty 2

## 2017-12-10 MED ORDER — ORAL CARE MOUTH RINSE
15.0000 mL | Freq: Two times a day (BID) | OROMUCOSAL | Status: DC
  Administered 2017-12-10 (×2): 15 mL via OROMUCOSAL

## 2017-12-10 NOTE — Progress Notes (Addendum)
Physical Therapy Treatment Patient Details Name: Angela Lawson MRN: [id number] DOB: 08/07/1934 Today's Date: 12/10/2017    History of Present Illness 84 year old female with a history of COPD on home oxygen, non-small cell lung cancer undergoing chemotherapy, diabetes and chronic diastolic CHF, presents to the hospital with worsening shortness of breath, felt to be related to COPD exacerbation.  She is currently admitted for treatment including steroids, bronchodilators.  Her progress has been slow.  Developed episode of aspiration.  Speech therapy consulted.    PT Comments    Patient tolerated treatment well although she did need to pace herself due to shortness of breathe. She was able to walk further than last session with min guard to supervision assistance. She is back on 3 L of O2 which is her baseline level at home. She lives with her daughter and granddaughter.  Patient would continue to benefit from skilled physical therapy in current environment to continue return to prior function and increase strength, endurance, balance, coordination, and functional mobility and gait skills.     Follow Up Recommendations  No PT follow up     Equipment Recommendations  None recommended by PT    Recommendations for Other Services       Precautions / Restrictions Precautions Precautions: Fall Restrictions Weight Bearing Restrictions: No    Mobility  Bed Mobility Overal bed mobility: Modified Independent Bed Mobility: Supine to Sit;Sit to Supine     Supine to sit: Modified independent (Device/Increase time) Sit to supine: Modified independent (Device/Increase time)      Transfers Overall transfer level: Modified independent Equipment used: Rolling walker (2 wheeled) Transfers: Sit to/from Stand Sit to Stand: Min guard            Ambulation/Gait Ambulation/Gait assistance: Min guard Ambulation Distance (Feet): 50 Feet Assistive device: Rolling walker (2 wheeled) Gait  Pattern/deviations: Decreased step length - right;Decreased step length - left;Decreased stride length   Gait velocity interpretation: Below normal speed for age/gender General Gait Details: demonstrates slow labored cadence without loss of balance, limited mostly due to SOB, on 3 LPM O2   Stairs            Wheelchair Mobility    Modified Rankin (Stroke Patients Only)       Balance Overall balance assessment: Modified Independent Sitting-balance support: Bilateral upper extremity supported Sitting balance-Leahy Scale: Good     Standing balance support: Bilateral upper extremity supported;During functional activity Standing balance-Leahy Scale: Fair                              Cognition Arousal/Alertness: Awake/alert Behavior During Therapy: WFL for tasks assessed/performed Overall Cognitive Status: Within Functional Limits for tasks assessed                                        Exercises General Exercises - Upper Extremity Shoulder Flexion: Seated;AROM;Strengthening;Both;10 reps(limited AROM) General Exercises - Lower Extremity Ankle Circles/Pumps: Seated;AROM;Strengthening;Both;10 reps Long Arc Quad: Seated;AROM;Strengthening;Both;10 reps Hip Flexion/Marching: Seated;AROM;Strengthening;Both;10 reps    General Comments        Pertinent Vitals/Pain Pain Assessment: No/denies pain    Home Living                      Prior Function            PT Goals (current goals can now  be found in the care plan section) Acute Rehab PT Goals Patient Stated Goal: return home  PT Goal Formulation: With patient/family Time For Goal Achievement: 12/15/17 Potential to Achieve Goals: Good Progress towards PT goals: Progressing toward goals    Frequency    Min 3X/week      PT Plan Current plan remains appropriate    Co-evaluation              AM-PAC PT "6 Clicks" Daily Activity  Outcome Measure  Difficulty turning  over in bed (including adjusting bedclothes, sheets and blankets)?: None Difficulty moving from lying on back to sitting on the side of the bed? : None Difficulty sitting down on and standing up from a chair with arms (e.g., wheelchair, bedside commode, etc,.)?: A Little Help needed moving to and from a bed to chair (including a wheelchair)?: A Little Help needed walking in hospital room?: A Little Help needed climbing 3-5 steps with a railing? : A Lot 6 Click Score: 19    End of Session Equipment Utilized During Treatment: Gait belt Activity Tolerance: Patient tolerated treatment well;Patient limited by fatigue Patient left: in chair;with family/visitor present   PT Visit Diagnosis: Unsteadiness on feet (R26.81);Other abnormalities of gait and mobility (R26.89);Muscle weakness (generalized) (M62.81)     Time: 0981-1914 PT Time Calculation (min) (ACUTE ONLY): 27 min  Charges:  $Gait Training: 8-22 mins $Therapeutic Exercise: 8-22 mins             Floria Raveling. Hartnett-Rands, MS, PT [facility] [place] [phone]

## 2017-12-10 NOTE — Care Management Note (Signed)
Case Management Note  Patient Details  Name: Angela Lawson MRN: [id number] Date of Birth: January 08, 1934  If discussed at Long Length of Stay Meetings, dates discussed:  12/10/2016  Additional Comments:  Sherald Barge, RN 12/10/2017, 2:19 PM

## 2017-12-10 NOTE — Progress Notes (Signed)
Inpatient Diabetes Program Recommendations  AACE/ADA: New Consensus Statement on Inpatient Glycemic Control (2015)  Target Ranges:  Prepandial:   less than 140 mg/dL      Peak postprandial:   less than 180 mg/dL (1-2 hours)      Critically ill patients:  140 - 180 mg/dL   Results for TOMI, GRANDPRE (MRN [id number]) as of 12/10/2017 08:32  Ref. Range 12/09/2017 07:40 12/09/2017 11:43 12/09/2017 16:13 12/09/2017 21:46 12/10/2017 07:53  Glucose-Capillary Latest Ref Range: 65 - 99 mg/dL 176 (H) 338 (H) 259 (H) 232 (H) 180 (H)   Review of Glycemic Control  Diabetes history: DM2 Outpatient Diabetes medications: Lantus 15 units QHS, Tradjenta 5 mg daily Current orders for Inpatient glycemic control: Lantus 15 units QHS, Novolog 0-9 units TID with meals, Novolog 0-5 units QHS; Solumedrol 60 mg Q6H  Inpatient Diabetes Program Recommendations:  Insulin - Meal Coverage: If steroids are continued, please consider ordering Novolog 4 units TID with meals for meal coverage if patient eats at least 50% of meals.  Thanks, Barnie Alderman, RN, MSN, CDE Diabetes Coordinator Inpatient Diabetes Program [phone] (Team Pager from 8am to 5pm)

## 2017-12-10 NOTE — Progress Notes (Signed)
PROGRESS NOTE  Angela Lawson EXN:[id number] DOB: 1934/07/13 DOA: 12/03/2017 PCP: Rosita Fire, MD  Brief History:   84 y.o. female with medical history significant for COPD with chronic hypoxic respiratory failure, chronic diastolic CHF, recurrent non-small cell lung cancer undergoing chemotherapy, insulin-dependent diabetes mellitus, now presenting to the emergency department for evaluation of increased shortness of breath.  Patient reports that over the past 1-2 days, she has had progressive dyspnea with minimal exertion and secondary to this. Upon arrival to the ED, patient is found to be afebrile, trading 90% on 3 L/min while at rest desaturating into the 70s with ambulation.  Blood pressure and heart rate are normal.  EKG features a sinus rhythm and chest x-ray is notable for chronic findings, but no acute disease.  Chemistry panel reveals a creatinine of 1.73   Assessment/Plan: Acute on chronic respiratory failure with hypoxia -Secondary to COPD exacerbation -On 3 L at baseline  COPD exacerbation -Increase Pulmicort 0.5 mg twice daily -Continue IV Solu-Medrol -Continue duo nebs  Acute on chronic renal failure CKD stage III -Baseline creatinine 1.1-1.4 -Serum creatinine peaked 1.73 -Secondary to volume depletion  Non-small cell lung cancer -had relapse/recurrence 04/26/17--confirmed by 04/23/17 [facility]--Dr. Roxan Hockey -Last chemotherapy 12/03/2017 -10/08/17 CT chest--Similar appearance of the chest with an upper normal sized paratracheal lymph node, confluent ground-glass opacity in the left lower lobe, and severe centrilobular emphysema, but no findings of progressive or definite active malignancy. -follow up at cancer center  Diabetes mellitus type 2 -04/21/2017 hemoglobin A1c 7.0 -Anticipate CBGs improvement with de-escalation of steroids -Repeat A1c  Elevated d-dimer -VQ scan low probability -Likely reactive secondary to malignancy  Dysphagia -appreciate speech  therapy eval -dys 3 diet with thin liquid  Chronic diastolic CHF -Clinically compensated -Daily weights -Continue home dose furosemide -08/01/2015 Echo EF 55-60%, grade 1 DD  Essential hypertension -Stable -Continue metoprolol succinate   Disposition Plan:   Home 1/4 if stable Family Communication: No  Family at bedside  Consultants:  none  Code Status:  FULL   DVT Prophylaxis:  [place] Heparin    Procedures: As Listed in Progress Note Above  Antibiotics: Doxycycline 12/31>>>>    Subjective: Patient states that she is breathing 75% better.  She denies any nausea, vomiting, diarrhea, abdominal pain, chest pain, fevers, chills.  Objective: Vitals:   12/10/17 0731 12/10/17 0735 12/10/17 1454 12/10/17 1514  BP:    (!) 143/80  Pulse:    76  Resp:    18  Temp:    98.5 F (36.9 C)  TempSrc:    Oral  SpO2: 96% 96% 97% 99%  Weight:      Height:        Intake/Output Summary (Last 24 hours) at 12/10/2017 1824 Last data filed at 12/10/2017 4967 Gross per 24 hour  Intake 126 ml  Output 800 ml  Net -674 ml   Weight change: -1.066 kg (-5.6 oz) Exam:   General:  Pt is alert, follows commands appropriately, not in acute distress  HEENT: No icterus, No thrush, No neck mass, [place]/AT  Cardiovascular: RRR, S1/S2, no rubs, no gallops  Respiratory: Bibasilar crackles.  Minimal basilar wheeze.  Good air movement.  Abdomen: Soft/+BS, non tender, non distended, no guarding  Extremities: No edema, No lymphangitis, No petechiae, No rashes, no synovitis   Data Reviewed: I have personally reviewed following labs and imaging studies Basic Metabolic Panel: Recent Labs  Lab 12/04/17 0523 12/05/17 0933 12/05/17 1423 12/08/17 0357  12/10/17 0427  NA 135 135 137 135 139  K 4.8 5.4* 4.6 4.7 4.4  CL 101 100* 102 102 101  CO2 24 25 24 24 28   GLUCOSE 268* 222* 162* 241* 215*  BUN 21* 23* 25* 28* 35*  CREATININE 1.69* 1.53* 1.52* 1.34* 1.45*  CALCIUM 8.9 9.6 9.8 8.7* 8.9   Liver  Function Tests: No results for input(s): AST, ALT, ALKPHOS, BILITOT, PROT, ALBUMIN in the last 168 hours. No results for input(s): LIPASE, AMYLASE in the last 168 hours. No results for input(s): AMMONIA in the last 168 hours. Coagulation Profile: No results for input(s): INR, PROTIME in the last 168 hours. CBC: Recent Labs  Lab 12/04/17 0523 12/08/17 0357 12/10/17 0427  WBC 7.0 11.6* 18.0*  NEUTROABS 6.3  --   --   HGB 11.0* 11.1* 11.9*  HCT 36.0 35.8* 38.7  MCV 95.2 94.2 92.8  PLT 187 181 199   Cardiac Enzymes: No results for input(s): CKTOTAL, CKMB, CKMBINDEX, TROPONINI in the last 168 hours. BNP: Invalid input(s): POCBNP CBG: Recent Labs  Lab 12/09/17 1613 12/09/17 2146 12/10/17 0753 12/10/17 1118 12/10/17 1613  GLUCAP 259* 232* 180* 257* 280*   HbA1C: No results for input(s): HGBA1C in the last 72 hours. Urine analysis:    Component Value Date/Time   COLORURINE YELLOW 04/26/2017 2112   APPEARANCEUR CLEAR 04/26/2017 2112   LABSPEC 1.008 04/26/2017 2112   PHURINE 6.0 04/26/2017 2112   GLUCOSEU NEGATIVE 04/26/2017 2112   HGBUR NEGATIVE 04/26/2017 2112   BILIRUBINUR NEGATIVE 04/26/2017 2112   KETONESUR NEGATIVE 04/26/2017 2112   PROTEINUR NEGATIVE 04/26/2017 2112   UROBILINOGEN 0.2 04/19/2012 1411   NITRITE NEGATIVE 04/26/2017 2112   LEUKOCYTESUR TRACE (A) 04/26/2017 2112   Sepsis Labs: @LABRCNTIP (procalcitonin:4,lacticidven:4) ) Recent Results (from the past 240 hour(s))  MRSA PCR Screening     Status: None   Collection Time: 12/04/17  6:00 PM  Result Value Ref Range Status   MRSA by PCR NEGATIVE NEGATIVE Final    Comment:        The GeneXpert MRSA Assay (FDA approved for NASAL specimens only), is one component of a comprehensive MRSA colonization surveillance program. It is not intended to diagnose MRSA infection nor to guide or monitor treatment for MRSA infections.      Scheduled Meds: . aspirin EC  81 mg Oral Daily  . atorvastatin  40 mg  Oral q1800  . budesonide (PULMICORT) nebulizer solution  0.5 mg Nebulization BID  . buPROPion  150 mg Oral BID  . carbidopa-levodopa  1 tablet Oral BID  . cholecalciferol  1,000 Units Oral Daily  . doxycycline  100 mg Oral Q12H  . feeding supplement (GLUCERNA SHAKE)  237 mL Oral TID BM  . furosemide  40 mg Oral Daily  . gabapentin  600 mg Oral QHS  . guaiFENesin  1,200 mg Oral BID  . heparin  5,000 Units Subcutaneous Q8H  . insulin aspart  0-5 Units Subcutaneous QHS  . insulin aspart  0-9 Units Subcutaneous TID WC  . insulin glargine  15 Units Subcutaneous QHS  . ipratropium-albuterol  3 mL Inhalation TID  . loratadine  10 mg Oral Daily  . mouth rinse  15 mL Mouth Rinse BID  . methylPREDNISolone (SOLU-MEDROL) injection  60 mg Intravenous Q6H  . metoprolol succinate  25 mg Oral Daily  . pantoprazole  40 mg Oral Daily  . pyridOXINE  100 mg Oral Daily  . rOPINIRole  0.5 mg Oral BID  . sodium chloride  flush  3 mL Intravenous Q12H   Continuous Infusions: . sodium chloride      Procedures/Studies: Dg Chest 2 View  Result Date: 12/03/2017 CLINICAL DATA:  84 year old female with increasing shortness of breath and wheezing. Chronic lung disease and left lung cancer. EXAM: CHEST  2 VIEW COMPARISON:  Restaging CT 10/08/2017 and earlier. FINDINGS: PA and lateral views. Stable right chest porta cath. Stable cardiac size and mediastinal contours. Chronic left lung volume loss, with reticular and indistinct left lung base opacity. Underlying large lung volumes and mild bilateral increased interstitial markings. No pleural effusion or new pulmonary opacity. Visualized tracheal air column is within normal limits. No acute osseous abnormality identified. Negative visible bowel gas pattern. IMPRESSION: Continued stable radiographic appearance of the chest. No acute findings identified. Electronically Signed   By: Genevie Ann M.D.   On: 12/03/2017 10:41   Ct Soft Tissue Neck Wo Contrast  Result Date:  11/12/2017 CLINICAL DATA:  Right-sided neck mass 3 days. History of lung cancer EXAM: CT NECK WITHOUT CONTRAST TECHNIQUE: Multidetector CT imaging of the neck was performed following the standard protocol without intravenous contrast. COMPARISON:  PET-CT 01/21/2017 FINDINGS: Pharynx and larynx: Normal. No mass or swelling. Salivary glands: No inflammation, mass, or stone. Thyroid: Heterogeneous thyroid normal in size.  No dominant mass. Lymph nodes: No enlarged lymph nodes in the neck. Vascular: Extensive atherosclerotic calcification in the carotid artery bilaterally. Vascular patency not evaluated without intravenous contrast. Atherosclerotic disease aortic arch without aneurysm. Limited intracranial: Negative Visualized orbits: Negative for orbital mass. Bilateral cataract removal. Mastoids and visualized paranasal sinuses: Negative Skeleton: Cervical spine degenerative change.  No bone lesion Upper chest: 15 mm right apical irregular lesions similar to the prior PET scan. This was not hypermetabolic previously. Other: None IMPRESSION: Negative for mass or adenopathy in the neck. Atherosclerotic disease Right apical irregular lesion stable from prior study possible scarring. Electronically Signed   By: Franchot Gallo M.D.   On: 11/12/2017 15:45   Nm Pulmonary Perf And Vent  Result Date: 12/05/2017 CLINICAL DATA:  Positive D-dimer.  Pulmonary embolus suspected. EXAM: NUCLEAR MEDICINE VENTILATION - PERFUSION LUNG SCAN TECHNIQUE: Ventilation images were obtained in multiple projections using inhaled aerosol Tc-56m DTPA. Perfusion images were obtained in multiple projections after intravenous injection of Tc-39m MAA. RADIOPHARMACEUTICALS:  30 mCi Technetium-62m DTPA aerosol inhalation and 4 mCi Technetium-9m MAA IV COMPARISON:  Chest x-ray 12/03/2017.  Chest CT 10/08/2017. FINDINGS: Ventilation: Limited assessment due to central aggregation of inhalational agent. Perfusion: No wedge shaped peripheral perfusion  defects to suggest acute pulmonary embolism. Heterogeneous decreased perfusion is seen in the lung apices. IMPRESSION: Low probability for pulmonary embolus. Heterogeneously decreased perfusion in the lung apices is compatible with emphysema. Electronically Signed   By: Misty Stanley M.D.   On: 12/05/2017 11:46    Orson Eva, DO  [facility] Pager [phone]  If 7PM-7AM, please contact night-coverage [email or web address] Password [email or web address] 12/10/2017, 6:24 PM   LOS: 6 days

## 2017-12-11 LAB — GLUCOSE, CAPILLARY
Glucose-Capillary: 199 mg/dL — ABNORMAL HIGH (ref 65–99)
Glucose-Capillary: 334 mg/dL — ABNORMAL HIGH (ref 65–99)

## 2017-12-11 MED ORDER — PREDNISONE 20 MG PO TABS: 60.0000 mg | ORAL_TABLET | Freq: Every day | ORAL | Status: DC

## 2017-12-11 MED ORDER — DOXYCYCLINE HYCLATE 100 MG PO TABS: 100.0000 mg | ORAL_TABLET | Freq: Two times a day (BID) | ORAL | 0 refills | Status: DC

## 2017-12-11 MED ORDER — PREDNISONE 10 MG PO TABS: 60.0000 mg | ORAL_TABLET | Freq: Every day | ORAL | 0 refills | Status: DC

## 2017-12-11 NOTE — Progress Notes (Signed)
Inpatient Diabetes Program Recommendations  AACE/ADA: New Consensus Statement on Inpatient Glycemic Control (2015)  Target Ranges:  Prepandial:   less than 140 mg/dL      Peak postprandial:   less than 180 mg/dL (1-2 hours)      Critically ill patients:  140 - 180 mg/dL   Results for Angela Lawson, Angela Lawson (MRN [id number]) as of 12/11/2017 10:11  Ref. Range 12/10/2017 07:53 12/10/2017 11:18 12/10/2017 16:13 12/10/2017 21:40 12/11/2017 08:15  Glucose-Capillary Latest Ref Range: 65 - 99 mg/dL 180 (H) 257 (H) 280 (H) 278 (H) 199 (H)   Review of Glycemic Control  Diabetes history: DM2 Outpatient Diabetes medications: Lantus 15 units QHS, Tradjenta 5 mg daily Current orders for Inpatient glycemic control: Lantus 15 units QHS, Novolog 0-9 units TID with meals, Novolog 0-5 units QHS; Solumedrol 60 mg Q6H  Inpatient Diabetes Program Recommendations:  Insulin - Meal Coverage: Post prandial glucose is consistently elevated. If steroids are continued, please consider ordering Novolog 4 units TID with meals for meal coverage if patient eats at least 50% of meals.  Thanks, Barnie Alderman, RN, MSN, CDE Diabetes Coordinator Inpatient Diabetes Program [phone] (Team Pager from 8am to 5pm)

## 2017-12-11 NOTE — Progress Notes (Signed)
Patient complains of feeling like when she eats her food "gets stuck". No coughing noted, lung sounds clear bilaterally, patient took all her morning medications whole and with water, no problems swallowing. MD made aware of patient concern.

## 2017-12-11 NOTE — Care Management Important Message (Signed)
Important Message  Patient Details  Name: CHAISE PASSARELLA MRN: [id number] Date of Birth: [birth year]   Medicare Important Message Given:  Yes    Sherald Barge, RN 12/11/2017, 12:56 PM

## 2017-12-11 NOTE — Care Management Note (Signed)
Case Management Note  Patient Details  Name: Angela Lawson MRN: [id number] Date of Birth: 04-10-1934  Expected Discharge Date:  12/11/17               Expected Discharge Plan:  [place]  In-House Referral:  NA  Discharge planning Services  CM Consult  Post Acute Care Choice:  Home Health Choice offered to:  Patient  HH Arranged:  RN, Speech Therapy HH Agency:  [place]  Status of Service:  Completed, signed off  Additional Comments: Discharging home today. [place] RN and SLP ordered. Pt has chosen [facility] from list of [facility] providers. Pt aware HH has 48 hrs to make first visit. Vaughan Basta, [facility] is aware of referral and DC home today. She will pull orders from chart.   Sherald Barge, RN 12/11/2017, 12:51 PM

## 2017-12-11 NOTE — Discharge Summary (Signed)
Physician Discharge Summary  Angela Lawson WUJ:[id number] DOB: [birth year] DOA: 12/03/2017  PCP: Rosita Fire, MD  Admit date: 12/03/2017 Discharge date: 12/11/2017  Admitted From: Home Disposition:  Home   Recommendations for Outpatient Follow-up:  1. Follow up with PCP in 1-2 weeks 2. Please obtain BMP/CBC in one week 3. Please refer pt to GI for possible EGD for dysphagia   Home Health: YES Equipment/Devices: RN, SLP  Discharge Condition: Stable CODE STATUS: FULL Diet recommendation: Heart Healthy / Carb Modified    Brief/Interim Summary: 84 y.o.femalewith medical history significant forCOPD with chronic hypoxic respiratory failure, chronic diastolic CHF, recurrent non-small cell lung cancer undergoing chemotherapy, insulin-dependent diabetes mellitus, now presenting to the emergency department for evaluation of increased shortness of breath. Patient reports that over the past 1-2 days, she has had progressive dyspnea with minimal exertion and secondary to this. Upon arrival to the ED, patient is found to be afebrile, trading 90% on 3 L/min while at rest desaturating into the 70s with ambulation. Blood pressure and heart rate are normal. EKG features a sinus rhythm and chest x-ray is notable for chronic findings, but no acute disease. Chemistry panel reveals a creatinine of 1.73    Discharge Diagnoses:  Acute on chronic respiratory failure with hypoxia -Secondary to COPD exacerbation -On 3 L at baseline  COPD exacerbation -Increase Pulmicort 0.5 mg twice daily -Continue IV Solu-Medrol>>>home with prednisone taper over 1 week -home with doxy x 3 more days to finish one week tx -Continue duo nebs  Acute on chronic renal failure CKD stage III -Baseline creatinine 1.1-1.4 -Serum creatinine peaked 1.73 -Secondary to volume depletion  Non-small cell lung cancer -had relapse/recurrence 04/26/17--confirmed by 04/23/17 [facility]--Dr. Roxan Hockey -Last chemotherapy  12/03/2017 -10/08/17 CT chest--Similar appearance of the chest with an upper normal sized paratracheal lymph node, confluent ground-glass opacity in the left lower lobe, and severe centrilobular emphysema, but no findings of progressive or definite active malignancy. -follow up at cancer center  Diabetes mellitus type 2 -04/21/2017 hemoglobin A1c 7.0 -Anticipate CBGs improvement with de-escalation of steroids  Elevated d-dimer -VQ scan low probability -Likely reactive secondary to malignancy  Dysphagia -appreciate speech therapy eval -dys 3 diet with thin liquid  Chronic diastolic CHF -Clinically compensated -Daily weights -Continue home dose furosemide -08/01/2015 Echo EF 55-60%, grade 1 DD  Essential hypertension -Stable -Continue metoprolol succinate     Discharge Instructions   Allergies as of 12/11/2017      Reactions   Lisinopril Cough      Medication List    TAKE these medications   alendronate 70 MG tablet Commonly known as:  FOSAMAX Take 70 mg by mouth every Friday.   aspirin EC 81 MG tablet Take 81 mg by mouth daily.   atorvastatin 40 MG tablet Commonly known as:  LIPITOR Take 40 mg by mouth at bedtime.   buPROPion 150 MG 12 hr tablet Commonly known as:  WELLBUTRIN SR Take 150 mg by mouth 2 (two) times daily.   carbidopa-levodopa 25-100 MG tablet Commonly known as:  SINEMET IR Take 1 tablet by mouth 2 (two) times daily.   cholecalciferol 1000 units tablet Commonly known as:  VITAMIN D Take 1,000 Units by mouth daily.   clotrimazole-betamethasone cream Commonly known as:  LOTRISONE Apply 1 application topically 2 (two) times daily.   doxycycline 100 MG tablet Commonly known as:  VIBRA-TABS Take 1 tablet (100 mg total) by mouth every 12 (twelve) hours.   FISH OIL PO Take 1 capsule by mouth daily.  furosemide 40 MG tablet Commonly known as:  LASIX Take 1 tablet (40 mg total) by mouth daily. Pt is able to take one additional tablet  daily for weight increases of 3lbs in a day or 5lbs in a week. Resume on 10/20   gabapentin 300 MG capsule Commonly known as:  NEURONTIN Take 600 mg by mouth at bedtime.   Garlic 253 MG Tabs Take 300 mg by mouth daily.   guaiFENesin 600 MG 12 hr tablet Commonly known as:  MUCINEX Take 1 tablet (600 mg total) by mouth 2 (two) times daily.   ipratropium-albuterol 0.5-2.5 (3) MG/3ML Soln Commonly known as:  DUONEB Inhale 3 mLs into the lungs every 6 (six) hours as needed (for wheezing/shortness of breath).   KLOR-CON 10 10 MEQ tablet Generic drug:  potassium chloride TAKE 1 TABLET (10 MEQ TOTAL) BY MOUTH DAILY. *FURTHER REFILLS NEED TO BE AUTHORIZED BY PCP*   LANTUS SOLOSTAR 100 UNIT/ML Solostar Pen Generic drug:  Insulin Glargine Inject 15 Units into the skin at bedtime.   lidocaine-prilocaine cream Commonly known as:  EMLA Apply to affected area once   linagliptin 5 MG Tabs tablet Commonly known as:  TRADJENTA Take 5 mg by mouth daily.   meclizine 25 MG tablet Commonly known as:  ANTIVERT Take 25 mg by mouth daily.   metoprolol succinate 25 MG 24 hr tablet Commonly known as:  TOPROL-XL Take 1 tablet (25 mg total) by mouth daily.   nitroGLYCERIN 0.4 MG SL tablet Commonly known as:  NITROSTAT Place 0.4 mg under the tongue every 5 (five) minutes as needed for chest pain.   omeprazole 40 MG capsule Commonly known as:  PRILOSEC Take 40 mg by mouth daily.   ondansetron 8 MG tablet Commonly known as:  ZOFRAN Take 1 tablet (8 mg total) by mouth 2 (two) times daily as needed (Nausea or vomiting).   OXYGEN Inhale 3 L into the lungs continuous.   predniSONE 10 MG tablet Commonly known as:  DELTASONE Take 6 tablets (60 mg total) by mouth daily with breakfast. And decrease by one tablet daily Start taking on:  12/12/2017   PROAIR HFA 108 (90 Base) MCG/ACT inhaler Generic drug:  albuterol Inhale 2 puffs into the lungs every 4 (four) hours as needed for wheezing or  shortness of breath.   prochlorperazine 10 MG tablet Commonly known as:  COMPAZINE Take 1 tablet (10 mg total) by mouth every 6 (six) hours as needed (Nausea or vomiting).   pyridoxine 100 MG tablet Commonly known as:  B-6 Take 100 mg by mouth daily.   rOPINIRole 0.5 MG tablet Commonly known as:  REQUIP Take 0.5 mg by mouth 2 (two) times daily.   sodium chloride 0.65 % Soln nasal spray Commonly known as:  OCEAN Place 1 spray into both nostrils every 3 (three) hours as needed for congestion.   TECENTRIQ IV Inject into the vein. Every 3 weeks   traMADol 50 MG tablet Commonly known as:  ULTRAM Take 50 mg by mouth every 12 (twelve) hours as needed for moderate pain.   zolpidem 5 MG tablet Commonly known as:  AMBIEN Take 5 mg by mouth at bedtime as needed for sleep.       Allergies  Allergen Reactions  . Lisinopril Cough    Consultations:  none   Procedures/Studies: Dg Chest 2 View  Result Date: 12/03/2017 CLINICAL DATA:  84 year old female with increasing shortness of breath and wheezing. Chronic lung disease and left lung cancer. EXAM: CHEST  2 VIEW COMPARISON:  Restaging CT 10/08/2017 and earlier. FINDINGS: PA and lateral views. Stable right chest porta cath. Stable cardiac size and mediastinal contours. Chronic left lung volume loss, with reticular and indistinct left lung base opacity. Underlying large lung volumes and mild bilateral increased interstitial markings. No pleural effusion or new pulmonary opacity. Visualized tracheal air column is within normal limits. No acute osseous abnormality identified. Negative visible bowel gas pattern. IMPRESSION: Continued stable radiographic appearance of the chest. No acute findings identified. Electronically Signed   By: Genevie Ann M.D.   On: 12/03/2017 10:41   Ct Soft Tissue Neck Wo Contrast  Result Date: 11/12/2017 CLINICAL DATA:  Right-sided neck mass 3 days. History of lung cancer EXAM: CT NECK WITHOUT CONTRAST TECHNIQUE:  Multidetector CT imaging of the neck was performed following the standard protocol without intravenous contrast. COMPARISON:  PET-CT 01/21/2017 FINDINGS: Pharynx and larynx: Normal. No mass or swelling. Salivary glands: No inflammation, mass, or stone. Thyroid: Heterogeneous thyroid normal in size.  No dominant mass. Lymph nodes: No enlarged lymph nodes in the neck. Vascular: Extensive atherosclerotic calcification in the carotid artery bilaterally. Vascular patency not evaluated without intravenous contrast. Atherosclerotic disease aortic arch without aneurysm. Limited intracranial: Negative Visualized orbits: Negative for orbital mass. Bilateral cataract removal. Mastoids and visualized paranasal sinuses: Negative Skeleton: Cervical spine degenerative change.  No bone lesion Upper chest: 15 mm right apical irregular lesions similar to the prior PET scan. This was not hypermetabolic previously. Other: None IMPRESSION: Negative for mass or adenopathy in the neck. Atherosclerotic disease Right apical irregular lesion stable from prior study possible scarring. Electronically Signed   By: Franchot Gallo M.D.   On: 11/12/2017 15:45   Nm Pulmonary Perf And Vent  Result Date: 12/05/2017 CLINICAL DATA:  Positive D-dimer.  Pulmonary embolus suspected. EXAM: NUCLEAR MEDICINE VENTILATION - PERFUSION LUNG SCAN TECHNIQUE: Ventilation images were obtained in multiple projections using inhaled aerosol Tc-81m DTPA. Perfusion images were obtained in multiple projections after intravenous injection of Tc-46m MAA. RADIOPHARMACEUTICALS:  30 mCi Technetium-48m DTPA aerosol inhalation and 4 mCi Technetium-28m MAA IV COMPARISON:  Chest x-ray 12/03/2017.  Chest CT 10/08/2017. FINDINGS: Ventilation: Limited assessment due to central aggregation of inhalational agent. Perfusion: No wedge shaped peripheral perfusion defects to suggest acute pulmonary embolism. Heterogeneous decreased perfusion is seen in the lung apices. IMPRESSION: Low  probability for pulmonary embolus. Heterogeneously decreased perfusion in the lung apices is compatible with emphysema. Electronically Signed   By: Misty Stanley M.D.   On: 12/05/2017 11:46         Discharge Exam: Vitals:   12/11/17 0622 12/11/17 0738  BP: 135/63   Pulse: 67   Resp: 18   Temp: 98.1 F (36.7 C)   SpO2: 96% 99%   Vitals:   12/10/17 1956 12/10/17 2038 12/11/17 0622 12/11/17 0738  BP:  127/87 135/63   Pulse:  77 67   Resp:  18 18   Temp:  98.7 F (37.1 C) 98.1 F (36.7 C)   TempSrc:  Oral Oral   SpO2: 97% 94% 96% 99%  Weight:   79.5 kg (175 lb 3.2 oz)   Height:        General: Pt is alert, awake, not in acute distress Cardiovascular: RRR, S1/S2 +, no rubs, no gallops Respiratory: CTA bilaterally, no wheezing, no rhonchi Abdominal: Soft, NT, ND, bowel sounds + Extremities: no edema, no cyanosis   The results of significant diagnostics from this hospitalization (including imaging, microbiology, ancillary and laboratory) are listed below  for reference.    Significant Diagnostic Studies: Dg Chest 2 View  Result Date: 12/03/2017 CLINICAL DATA:  84 year old female with increasing shortness of breath and wheezing. Chronic lung disease and left lung cancer. EXAM: CHEST  2 VIEW COMPARISON:  Restaging CT 10/08/2017 and earlier. FINDINGS: PA and lateral views. Stable right chest porta cath. Stable cardiac size and mediastinal contours. Chronic left lung volume loss, with reticular and indistinct left lung base opacity. Underlying large lung volumes and mild bilateral increased interstitial markings. No pleural effusion or new pulmonary opacity. Visualized tracheal air column is within normal limits. No acute osseous abnormality identified. Negative visible bowel gas pattern. IMPRESSION: Continued stable radiographic appearance of the chest. No acute findings identified. Electronically Signed   By: Genevie Ann M.D.   On: 12/03/2017 10:41   Ct Soft Tissue Neck Wo  Contrast  Result Date: 11/12/2017 CLINICAL DATA:  Right-sided neck mass 3 days. History of lung cancer EXAM: CT NECK WITHOUT CONTRAST TECHNIQUE: Multidetector CT imaging of the neck was performed following the standard protocol without intravenous contrast. COMPARISON:  PET-CT 01/21/2017 FINDINGS: Pharynx and larynx: Normal. No mass or swelling. Salivary glands: No inflammation, mass, or stone. Thyroid: Heterogeneous thyroid normal in size.  No dominant mass. Lymph nodes: No enlarged lymph nodes in the neck. Vascular: Extensive atherosclerotic calcification in the carotid artery bilaterally. Vascular patency not evaluated without intravenous contrast. Atherosclerotic disease aortic arch without aneurysm. Limited intracranial: Negative Visualized orbits: Negative for orbital mass. Bilateral cataract removal. Mastoids and visualized paranasal sinuses: Negative Skeleton: Cervical spine degenerative change.  No bone lesion Upper chest: 15 mm right apical irregular lesions similar to the prior PET scan. This was not hypermetabolic previously. Other: None IMPRESSION: Negative for mass or adenopathy in the neck. Atherosclerotic disease Right apical irregular lesion stable from prior study possible scarring. Electronically Signed   By: Franchot Gallo M.D.   On: 11/12/2017 15:45   Nm Pulmonary Perf And Vent  Result Date: 12/05/2017 CLINICAL DATA:  Positive D-dimer.  Pulmonary embolus suspected. EXAM: NUCLEAR MEDICINE VENTILATION - PERFUSION LUNG SCAN TECHNIQUE: Ventilation images were obtained in multiple projections using inhaled aerosol Tc-22m DTPA. Perfusion images were obtained in multiple projections after intravenous injection of Tc-26m MAA. RADIOPHARMACEUTICALS:  30 mCi Technetium-55m DTPA aerosol inhalation and 4 mCi Technetium-76m MAA IV COMPARISON:  Chest x-ray 12/03/2017.  Chest CT 10/08/2017. FINDINGS: Ventilation: Limited assessment due to central aggregation of inhalational agent. Perfusion: No wedge  shaped peripheral perfusion defects to suggest acute pulmonary embolism. Heterogeneous decreased perfusion is seen in the lung apices. IMPRESSION: Low probability for pulmonary embolus. Heterogeneously decreased perfusion in the lung apices is compatible with emphysema. Electronically Signed   By: Misty Stanley M.D.   On: 12/05/2017 11:46     Microbiology: Recent Results (from the past 240 hour(s))  MRSA PCR Screening     Status: None   Collection Time: 12/04/17  6:00 PM  Result Value Ref Range Status   MRSA by PCR NEGATIVE NEGATIVE Final    Comment:        The GeneXpert MRSA Assay (FDA approved for NASAL specimens only), is one component of a comprehensive MRSA colonization surveillance program. It is not intended to diagnose MRSA infection nor to guide or monitor treatment for MRSA infections.      Labs: Basic Metabolic Panel: Recent Labs  Lab 12/05/17 0933 12/05/17 1423 12/08/17 0357 12/10/17 0427  NA 135 137 135 139  K 5.4* 4.6 4.7 4.4  CL 100* 102 102 101  CO2 25 24 24 28   GLUCOSE 222* 162* 241* 215*  BUN 23* 25* 28* 35*  CREATININE 1.53* 1.52* 1.34* 1.45*  CALCIUM 9.6 9.8 8.7* 8.9   Liver Function Tests: No results for input(s): AST, ALT, ALKPHOS, BILITOT, PROT, ALBUMIN in the last 168 hours. No results for input(s): LIPASE, AMYLASE in the last 168 hours. No results for input(s): AMMONIA in the last 168 hours. CBC: Recent Labs  Lab 12/08/17 0357 12/10/17 0427  WBC 11.6* 18.0*  HGB 11.1* 11.9*  HCT 35.8* 38.7  MCV 94.2 92.8  PLT 181 199   Cardiac Enzymes: No results for input(s): CKTOTAL, CKMB, CKMBINDEX, TROPONINI in the last 168 hours. BNP: Invalid input(s): POCBNP CBG: Recent Labs  Lab 12/10/17 1118 12/10/17 1613 12/10/17 2140 12/11/17 0815 12/11/17 1123  GLUCAP 257* 280* 278* 199* 334*    Time coordinating discharge:  Greater than 30 minutes  Signed:  Orson Eva, DO [facility] Pager: [phone] 12/11/2017, 12:19 PM

## 2017-12-11 NOTE — Progress Notes (Signed)
Physical Therapy Treatment Patient Details Name: Angela Lawson MRN: [id number] DOB: 18-Nov-1934 Today's Date: 12/11/2017    History of Present Illness 84 year old female with a history of COPD on home oxygen, non-small cell lung cancer undergoing chemotherapy, diabetes and chronic diastolic CHF, presents to the hospital with worsening shortness of breath, felt to be related to COPD exacerbation.  She is currently admitted for treatment including steroids, bronchodilators.  Her progress has been slow.  Developed episode of aspiration.  Speech therapy consulted.    PT Comments    Patient tolerated treatment well today. Was able to increase ambulation distance with RW with less assistance today. Improved standing balance, endurance for activity although continues to be overall deconditioned.  Patient would continue to benefit from skilled physical therapy in hospital to improve strength, endurance, balance, coordination, and functional mobility and gait skills.     Follow Up Recommendations  No PT follow up     Equipment Recommendations  None recommended by PT    Recommendations for Other Services       Precautions / Restrictions Precautions Precautions: Fall Restrictions Weight Bearing Restrictions: No    Mobility  Bed Mobility Overal bed mobility: Modified Independent Bed Mobility: Sit to Supine       Sit to supine: Modified independent (Device/Increase time)      Transfers Overall transfer level: Modified independent Equipment used: Rolling walker (2 wheeled) Transfers: Sit to/from [facility] Sit to Stand: Min guard Stand pivot transfers: Supervision          Ambulation/Gait Ambulation/Gait assistance: Supervision Ambulation Distance (Feet): 100 Feet Assistive device: Rolling walker (2 wheeled) Gait Pattern/deviations: Decreased step length - right;Decreased step length - left;Decreased stride length;Decreased stance time - right;Antalgic   Gait  velocity interpretation: Below normal speed for age/gender General Gait Details: demonstrates slow labored cadence without loss of balance, limited mostly due to SOB, on 2 LPM O2; did c/o right knee pain   Stairs            Wheelchair Mobility    Modified Rankin (Stroke Patients Only)       Balance Overall balance assessment: Modified Independent Sitting-balance support: No upper extremity supported;Feet supported Sitting balance-Leahy Scale: Good     Standing balance support: Bilateral upper extremity supported;During functional activity Standing balance-Leahy Scale: Fair                              Cognition Arousal/Alertness: Awake/alert Behavior During Therapy: WFL for tasks assessed/performed Overall Cognitive Status: Within Functional Limits for tasks assessed                                        Exercises General Exercises - Upper Extremity Shoulder Flexion: Seated;AROM;Strengthening;Both;10 reps General Exercises - Lower Extremity Ankle Circles/Pumps: Seated;AROM;Strengthening;Both;10 reps Gluteal Sets: Seated;AROM;Strengthening;Both;10 reps Long Arc Quad: Seated;AROM;Strengthening;Both;10 reps Hip Flexion/Marching: Seated;AROM;Strengthening;Both;10 reps    General Comments        Pertinent Vitals/Pain Pain Assessment: No/denies pain    Home Living                      Prior Function            PT Goals (current goals can now be found in the care plan section) Acute Rehab PT Goals Patient Stated Goal: return home  PT Goal Formulation: With patient  Time For Goal Achievement: 12/15/17 Potential to Achieve Goals: Good    Frequency    Min 3X/week      PT Plan      Co-evaluation              AM-PAC PT "6 Clicks" Daily Activity  Outcome Measure  Difficulty turning over in bed (including adjusting bedclothes, sheets and blankets)?: None Difficulty moving from lying on back to sitting on the  side of the bed? : None Difficulty sitting down on and standing up from a chair with arms (e.g., wheelchair, bedside commode, etc,.)?: A Little Help needed moving to and from a bed to chair (including a wheelchair)?: A Little Help needed walking in hospital room?: A Little Help needed climbing 3-5 steps with a railing? : A Lot 6 Click Score: 19    End of Session   Activity Tolerance: Patient tolerated treatment well;Patient limited by fatigue Patient left: in bed;with call bell/phone within reach;with bed alarm set Nurse Communication: Mobility status PT Visit Diagnosis: Unsteadiness on feet (R26.81);Other abnormalities of gait and mobility (R26.89);Muscle weakness (generalized) (M62.81)     Time: 1020-1047 PT Time Calculation (min) (ACUTE ONLY): 27 min  Charges:  $Gait Training: 8-22 mins $Therapeutic Exercise: 8-22 mins                    G Codes:       Bindi Klomp D. Hartnett-Rands, MS, PT Per [place] [phone]

## 2017-12-11 NOTE — Progress Notes (Signed)
Discharge instructions reviewed with patient and daughter. Both verbalize understanding. Discharge instruction handout given.

## 2017-12-14 DIAGNOSIS — Z7982 Long term (current) use of aspirin: Secondary | ICD-10-CM | POA: Diagnosis not present

## 2017-12-14 DIAGNOSIS — C3490 Malignant neoplasm of unspecified part of unspecified bronchus or lung: Secondary | ICD-10-CM | POA: Diagnosis not present

## 2017-12-14 DIAGNOSIS — J441 Chronic obstructive pulmonary disease with (acute) exacerbation: Secondary | ICD-10-CM | POA: Diagnosis not present

## 2017-12-14 DIAGNOSIS — I130 Hypertensive heart and chronic kidney disease with heart failure and stage 1 through stage 4 chronic kidney disease, or unspecified chronic kidney disease: Secondary | ICD-10-CM | POA: Diagnosis not present

## 2017-12-14 DIAGNOSIS — Z7951 Long term (current) use of inhaled steroids: Secondary | ICD-10-CM | POA: Diagnosis not present

## 2017-12-14 DIAGNOSIS — N183 Chronic kidney disease, stage 3 (moderate): Secondary | ICD-10-CM | POA: Diagnosis not present

## 2017-12-14 DIAGNOSIS — Z794 Long term (current) use of insulin: Secondary | ICD-10-CM | POA: Diagnosis not present

## 2017-12-14 DIAGNOSIS — I5032 Chronic diastolic (congestive) heart failure: Secondary | ICD-10-CM | POA: Diagnosis not present

## 2017-12-14 DIAGNOSIS — R1310 Dysphagia, unspecified: Secondary | ICD-10-CM | POA: Diagnosis not present

## 2017-12-14 DIAGNOSIS — E1122 Type 2 diabetes mellitus with diabetic chronic kidney disease: Secondary | ICD-10-CM | POA: Diagnosis not present

## 2017-12-14 DIAGNOSIS — J9621 Acute and chronic respiratory failure with hypoxia: Secondary | ICD-10-CM | POA: Diagnosis not present

## 2017-12-14 DIAGNOSIS — Z9981 Dependence on supplemental oxygen: Secondary | ICD-10-CM | POA: Diagnosis not present

## 2017-12-14 DIAGNOSIS — Z79891 Long term (current) use of opiate analgesic: Secondary | ICD-10-CM | POA: Diagnosis not present

## 2017-12-16 DIAGNOSIS — N183 Chronic kidney disease, stage 3 (moderate): Secondary | ICD-10-CM | POA: Diagnosis not present

## 2017-12-16 DIAGNOSIS — Z9981 Dependence on supplemental oxygen: Secondary | ICD-10-CM | POA: Diagnosis not present

## 2017-12-16 DIAGNOSIS — I130 Hypertensive heart and chronic kidney disease with heart failure and stage 1 through stage 4 chronic kidney disease, or unspecified chronic kidney disease: Secondary | ICD-10-CM | POA: Diagnosis not present

## 2017-12-16 DIAGNOSIS — I5032 Chronic diastolic (congestive) heart failure: Secondary | ICD-10-CM | POA: Diagnosis not present

## 2017-12-16 DIAGNOSIS — E1122 Type 2 diabetes mellitus with diabetic chronic kidney disease: Secondary | ICD-10-CM | POA: Diagnosis not present

## 2017-12-16 DIAGNOSIS — C3490 Malignant neoplasm of unspecified part of unspecified bronchus or lung: Secondary | ICD-10-CM | POA: Diagnosis not present

## 2017-12-16 DIAGNOSIS — Z7982 Long term (current) use of aspirin: Secondary | ICD-10-CM | POA: Diagnosis not present

## 2017-12-16 DIAGNOSIS — J441 Chronic obstructive pulmonary disease with (acute) exacerbation: Secondary | ICD-10-CM | POA: Diagnosis not present

## 2017-12-16 DIAGNOSIS — J9621 Acute and chronic respiratory failure with hypoxia: Secondary | ICD-10-CM | POA: Diagnosis not present

## 2017-12-16 DIAGNOSIS — R1310 Dysphagia, unspecified: Secondary | ICD-10-CM | POA: Diagnosis not present

## 2017-12-16 DIAGNOSIS — Z794 Long term (current) use of insulin: Secondary | ICD-10-CM | POA: Diagnosis not present

## 2017-12-16 DIAGNOSIS — Z79891 Long term (current) use of opiate analgesic: Secondary | ICD-10-CM | POA: Diagnosis not present

## 2017-12-16 DIAGNOSIS — Z7951 Long term (current) use of inhaled steroids: Secondary | ICD-10-CM | POA: Diagnosis not present

## 2017-12-18 DIAGNOSIS — I130 Hypertensive heart and chronic kidney disease with heart failure and stage 1 through stage 4 chronic kidney disease, or unspecified chronic kidney disease: Secondary | ICD-10-CM | POA: Diagnosis not present

## 2017-12-18 DIAGNOSIS — Z7982 Long term (current) use of aspirin: Secondary | ICD-10-CM | POA: Diagnosis not present

## 2017-12-18 DIAGNOSIS — N183 Chronic kidney disease, stage 3 (moderate): Secondary | ICD-10-CM | POA: Diagnosis not present

## 2017-12-18 DIAGNOSIS — R1310 Dysphagia, unspecified: Secondary | ICD-10-CM | POA: Diagnosis not present

## 2017-12-18 DIAGNOSIS — Z794 Long term (current) use of insulin: Secondary | ICD-10-CM | POA: Diagnosis not present

## 2017-12-18 DIAGNOSIS — J441 Chronic obstructive pulmonary disease with (acute) exacerbation: Secondary | ICD-10-CM | POA: Diagnosis not present

## 2017-12-18 DIAGNOSIS — Z79891 Long term (current) use of opiate analgesic: Secondary | ICD-10-CM | POA: Diagnosis not present

## 2017-12-18 DIAGNOSIS — Z7951 Long term (current) use of inhaled steroids: Secondary | ICD-10-CM | POA: Diagnosis not present

## 2017-12-18 DIAGNOSIS — C3490 Malignant neoplasm of unspecified part of unspecified bronchus or lung: Secondary | ICD-10-CM | POA: Diagnosis not present

## 2017-12-18 DIAGNOSIS — I5032 Chronic diastolic (congestive) heart failure: Secondary | ICD-10-CM | POA: Diagnosis not present

## 2017-12-18 DIAGNOSIS — J9621 Acute and chronic respiratory failure with hypoxia: Secondary | ICD-10-CM | POA: Diagnosis not present

## 2017-12-18 DIAGNOSIS — Z9981 Dependence on supplemental oxygen: Secondary | ICD-10-CM | POA: Diagnosis not present

## 2017-12-18 DIAGNOSIS — E1122 Type 2 diabetes mellitus with diabetic chronic kidney disease: Secondary | ICD-10-CM | POA: Diagnosis not present

## 2017-12-24 ENCOUNTER — Inpatient Hospital Stay (HOSPITAL_COMMUNITY): Payer: Medicare Other | Attending: Oncology

## 2017-12-24 ENCOUNTER — Encounter (HOSPITAL_COMMUNITY): Payer: Self-pay | Admitting: Internal Medicine

## 2017-12-24 ENCOUNTER — Other Ambulatory Visit: Payer: Self-pay

## 2017-12-24 ENCOUNTER — Inpatient Hospital Stay (HOSPITAL_COMMUNITY): Payer: Medicare Other

## 2017-12-24 ENCOUNTER — Encounter (HOSPITAL_COMMUNITY): Payer: Self-pay | Admitting: Adult Health

## 2017-12-24 ENCOUNTER — Inpatient Hospital Stay (HOSPITAL_BASED_OUTPATIENT_CLINIC_OR_DEPARTMENT_OTHER): Payer: Medicare Other | Admitting: Adult Health

## 2017-12-24 VITALS — BP 127/35 | HR 85 | Temp 97.6°F | Resp 20 | Wt 174.2 lb

## 2017-12-24 DIAGNOSIS — Z79899 Other long term (current) drug therapy: Secondary | ICD-10-CM | POA: Diagnosis not present

## 2017-12-24 DIAGNOSIS — M791 Myalgia, unspecified site: Secondary | ICD-10-CM

## 2017-12-24 DIAGNOSIS — Z7952 Long term (current) use of systemic steroids: Secondary | ICD-10-CM | POA: Insufficient documentation

## 2017-12-24 DIAGNOSIS — K5730 Diverticulosis of large intestine without perforation or abscess without bleeding: Secondary | ICD-10-CM

## 2017-12-24 DIAGNOSIS — T451X5S Adverse effect of antineoplastic and immunosuppressive drugs, sequela: Secondary | ICD-10-CM | POA: Diagnosis not present

## 2017-12-24 DIAGNOSIS — I509 Heart failure, unspecified: Secondary | ICD-10-CM | POA: Diagnosis not present

## 2017-12-24 DIAGNOSIS — Z9981 Dependence on supplemental oxygen: Secondary | ICD-10-CM | POA: Insufficient documentation

## 2017-12-24 DIAGNOSIS — J322 Chronic ethmoidal sinusitis: Secondary | ICD-10-CM | POA: Diagnosis not present

## 2017-12-24 DIAGNOSIS — J449 Chronic obstructive pulmonary disease, unspecified: Secondary | ICD-10-CM | POA: Insufficient documentation

## 2017-12-24 DIAGNOSIS — Z7189 Other specified counseling: Secondary | ICD-10-CM

## 2017-12-24 DIAGNOSIS — D696 Thrombocytopenia, unspecified: Secondary | ICD-10-CM | POA: Insufficient documentation

## 2017-12-24 DIAGNOSIS — E785 Hyperlipidemia, unspecified: Secondary | ICD-10-CM | POA: Insufficient documentation

## 2017-12-24 DIAGNOSIS — C3490 Malignant neoplasm of unspecified part of unspecified bronchus or lung: Secondary | ICD-10-CM

## 2017-12-24 DIAGNOSIS — I700 Atherosclerosis of aorta: Secondary | ICD-10-CM

## 2017-12-24 DIAGNOSIS — Z87891 Personal history of nicotine dependence: Secondary | ICD-10-CM | POA: Diagnosis not present

## 2017-12-24 DIAGNOSIS — C3412 Malignant neoplasm of upper lobe, left bronchus or lung: Secondary | ICD-10-CM

## 2017-12-24 DIAGNOSIS — R5383 Other fatigue: Secondary | ICD-10-CM | POA: Insufficient documentation

## 2017-12-24 DIAGNOSIS — C3431 Malignant neoplasm of lower lobe, right bronchus or lung: Secondary | ICD-10-CM

## 2017-12-24 DIAGNOSIS — M810 Age-related osteoporosis without current pathological fracture: Secondary | ICD-10-CM | POA: Insufficient documentation

## 2017-12-24 DIAGNOSIS — E119 Type 2 diabetes mellitus without complications: Secondary | ICD-10-CM | POA: Insufficient documentation

## 2017-12-24 DIAGNOSIS — Z5112 Encounter for antineoplastic immunotherapy: Secondary | ICD-10-CM | POA: Insufficient documentation

## 2017-12-24 DIAGNOSIS — Z7982 Long term (current) use of aspirin: Secondary | ICD-10-CM | POA: Insufficient documentation

## 2017-12-24 DIAGNOSIS — I2510 Atherosclerotic heart disease of native coronary artery without angina pectoris: Secondary | ICD-10-CM | POA: Diagnosis not present

## 2017-12-24 DIAGNOSIS — Z7984 Long term (current) use of oral hypoglycemic drugs: Secondary | ICD-10-CM | POA: Insufficient documentation

## 2017-12-24 DIAGNOSIS — I252 Old myocardial infarction: Secondary | ICD-10-CM | POA: Diagnosis not present

## 2017-12-24 DIAGNOSIS — G620 Drug-induced polyneuropathy: Secondary | ICD-10-CM

## 2017-12-24 DIAGNOSIS — Z794 Long term (current) use of insulin: Secondary | ICD-10-CM

## 2017-12-24 DIAGNOSIS — I110 Hypertensive heart disease with heart failure: Secondary | ICD-10-CM | POA: Insufficient documentation

## 2017-12-24 LAB — COMPREHENSIVE METABOLIC PANEL
ALT: 27 U/L (ref 14–54)
AST: 24 U/L (ref 15–41)
Albumin: 3.2 g/dL — ABNORMAL LOW (ref 3.5–5.0)
Alkaline Phosphatase: 44 U/L (ref 38–126)
Anion gap: 9 (ref 5–15)
BUN: 15 mg/dL (ref 6–20)
CO2: 26 mmol/L (ref 22–32)
Calcium: 9 mg/dL (ref 8.9–10.3)
Chloride: 100 mmol/L — ABNORMAL LOW (ref 101–111)
Creatinine, Ser: 1.28 mg/dL — ABNORMAL HIGH (ref 0.44–1.00)
GFR calc Af Amer: 44 mL/min — ABNORMAL LOW (ref >60)
GFR calc non Af Amer: 38 mL/min — ABNORMAL LOW (ref >60)
Glucose, Bld: 165 mg/dL — ABNORMAL HIGH (ref 65–99)
Potassium: 4.2 mmol/L (ref 3.5–5.1)
Sodium: 135 mmol/L (ref 135–145)
Total Bilirubin: 0.8 mg/dL (ref 0.3–1.2)
Total Protein: 6.4 g/dL — ABNORMAL LOW (ref 6.5–8.1)

## 2017-12-24 LAB — CBC WITH DIFFERENTIAL/PLATELET
Basophils Absolute: 0 10*3/uL (ref 0.0–0.1)
Basophils Relative: 0 %
Eosinophils Absolute: 0.1 10*3/uL (ref 0.0–0.7)
Eosinophils Relative: 1 %
HCT: 37.5 % (ref 36.0–46.0)
Hemoglobin: 11.6 g/dL — ABNORMAL LOW (ref 12.0–15.0)
Lymphocytes Relative: 13 %
Lymphs Abs: 1 10*3/uL (ref 0.7–4.0)
MCH: 29.3 pg (ref 26.0–34.0)
MCHC: 30.9 g/dL (ref 30.0–36.0)
MCV: 94.7 fL (ref 78.0–100.0)
Monocytes Absolute: 0.5 10*3/uL (ref 0.1–1.0)
Monocytes Relative: 6 %
Neutro Abs: 6.3 10*3/uL (ref 1.7–7.7)
Neutrophils Relative %: 80 %
Platelets: 99 10*3/uL — ABNORMAL LOW (ref 150–400)
RBC: 3.96 MIL/uL (ref 3.87–5.11)
RDW: 15.9 % — ABNORMAL HIGH (ref 11.5–15.5)
WBC: 7.8 10*3/uL (ref 4.0–10.5)

## 2017-12-24 LAB — TSH: TSH: 1.652 u[IU]/mL (ref 0.350–4.500)

## 2017-12-24 MED ORDER — SODIUM CHLORIDE 0.9 % IV SOLN
1200.0000 mg | Freq: Once | INTRAVENOUS | Status: AC
  Administered 2017-12-24: 1200 mg via INTRAVENOUS
  Filled 2017-12-24: qty 20

## 2017-12-24 MED ORDER — SODIUM CHLORIDE 0.9 % IV SOLN
Freq: Once | INTRAVENOUS | Status: AC
  Administered 2017-12-24: 13:00:00 via INTRAVENOUS

## 2017-12-24 MED ORDER — HEPARIN SOD (PORK) LOCK FLUSH 100 UNIT/ML IV SOLN
500.0000 [IU] | Freq: Once | INTRAVENOUS | Status: AC | PRN
  Administered 2017-12-24: 500 [IU]
  Filled 2017-12-24: qty 5

## 2017-12-24 NOTE — Patient Instructions (Signed)
[facility] Discharge Instructions for Patients Receiving Chemotherapy   Beginning January 23rd 2017 lab work for the [facility] will be done in the  Main lab at [facility] on 1st floor. If you have a lab appointment with the [facility] please come in thru the  Main Entrance and check in at the main information desk   Today you received the following chemotherapy agents:  Tecentriq  If you develop nausea and vomiting, or diarrhea that is not controlled by your medication, call the clinic.  Exam and discussion today with Elzie Rings, NP. Take tonic water with quinine for hand cramps. For the crackling in your ears, take over the counter Claritin as directed.  Call the clinic should you have any questions or concerns.   The clinic phone number is [phone]. Office hours are Monday-Friday 8:30am-5:00pm.  BELOW ARE SYMPTOMS THAT SHOULD BE REPORTED IMMEDIATELY:  *FEVER GREATER THAN 101.0 F  *CHILLS WITH OR WITHOUT FEVER  NAUSEA AND VOMITING THAT IS NOT CONTROLLED WITH YOUR NAUSEA MEDICATION  *UNUSUAL SHORTNESS OF BREATH  *UNUSUAL BRUISING OR BLEEDING  TENDERNESS IN MOUTH AND THROAT WITH OR WITHOUT PRESENCE OF ULCERS  *URINARY PROBLEMS  *BOWEL PROBLEMS  UNUSUAL RASH Items with * indicate a potential emergency and should be followed up as soon as possible. If you have an emergency after office hours please contact your primary care physician or go to the nearest emergency department.  Please call the clinic during office hours if you have any questions or concerns.   You may also contact the Patient Navigator at [phone] should you have any questions or need assistance in obtaining follow up care.      Resources For Cancer Patients and their Caregivers ? American Cancer Society: Can assist with transportation, wigs, general needs, runs Look Good Feel Better.        ([phone] ? Cancer Care: Provides financial assistance, online support  groups, medication/co-pay assistance.  1-800-813-HOPE 979-542-3400) ? [facility] Assists [place] cancer patients and their families through emotional , educational and financial support.  423-726-7204 ? [place] DSS Where to apply for food stamps, Medicaid and utility assistance. 5641151816 ? [facility]: Transportation to medical appointments. 413-188-7314 ? Social Security Administration: May apply for disability if have a Stage IV cancer. 9364089381 6206202765 ? [employer], Disability and Transit Services: Assists with nutrition, care and transit needs. 5011153173

## 2017-12-24 NOTE — Progress Notes (Signed)
Labs reviewed by MD - aware of platelet count of 99,000.  Okay to tx today per MD.   Tolerated infusion w/o adverse reaction.  Alert, in no distress.  VSS.  Discharged via wheelchair in c/o family.

## 2017-12-24 NOTE — Progress Notes (Signed)
Angela Lawson, [place] [address]   CLINIC:  Medical Oncology/Hematology  REASON FOR VISIT:  Routine follow-up during active treatment for Stage IV lung cancer   CURRENT THERAPY: Tecentriq every 21 days, beginning 05/28/17.  BRIEF ONCOLOGIC HISTORY/HPI:    Adenocarcinoma of lung ([place])   07/07/2008 Surgery    Performed in [place], [place].  Consistent with poorly differentiated adenocarcinoma.      02/27/2014 Imaging    CT of chest demonstrating a RLL solid nodular component measuring 9 mm at the site of previous ground-glass opacity. Slight increase in size of an anterior mediastinal node measuring 1.1 cm with stable 1 cm pretracheal lymph node. Consider PET      03/20/2014 Imaging    PET scan- No hypermetabolic pulmonary lesions to suggest recurrent or metastatic pulmonary disease.  Weakly positive left internal mammary lymph node and left axillary lymph node (? inflammatory process involving the left breast).      11/07/2015 Imaging    CT chest- 1. No evidence of metastatic involvement of the lungs. The previously noted small noncalcified lung nodules are stable. 2. Vague focal opacity in the right lower lobe may represent scarring and is stable. Continued followup however is recommended in 1 year.      01/08/2017 Imaging    CT chest- 1. New nodules along a thick walled bullous lesion in the right lower lobe, suspicious for malignancy. 2. New fairly large region of confluent ground-glass opacity posteriorly in the remaining left lung, low grade adenocarcinoma is not excluded and there is mild new nodularity along the upper margin of this process. 3. Stable biapical pleuroparenchymal scarring with several stable nodules in the left lung. 4. Borderline adenopathy in the mediastinum, essentially stable from prior. 5. Coronary, aortic arch, and branch vessel atherosclerotic vascular disease. 6. Small hiatal hernia. 7. Hypodense left thyroid  lesion. If not previously worked up, thyroid ultrasound could be employed.      01/21/2017 PET scan    1. New hypermetabolic adenopathy in the mediastinum and hila, with progressive hypermetabolic ground-glass density posteriorly in the left lower lobe with faint nodular components, and some low-grade hypermetabolic activity in ground-glass opacity dependently in the right upper lobe. Although the appearance is concerning for malignancy potentially with a lymphangitic component in the left lower lobe, the unusual configuration in the appearance of the ground-glass opacities could conceivably represent an atypical granulomatous infectious process as an alternative. This is not classic obvious recurrence with well-defined mass or nodules. 2. There is some ill-defined clustered nodularity in the right lower lobe only faintly metabolic, maximum SUV 3.0. 3. Overall I believe that this process require surveillance to differentiate an inflammatory/infectious condition from recurrent malignancy. The adenopathy is certainly concerning. 4. No findings of malignancy in the neck, abdomen/pelvis, or skeleton. 5. Sigmoid colon diverticulosis. 6. Emphysema. 7. Chronic ethmoid sinusitis.      03/25/2017 Imaging    CT chest- Stable 5.9 cm left lower lobe ground-glass opacity, hypermetabolic on prior PET, worrisome for primary bronchogenic neoplasm such as low-grade adenocarcinoma.  Stable 2.2 cm cavitary lesion in the central right lower lobe with surrounding hypermetabolic nodularity measuring up to 1.8 cm, worrisome for primary bronchogenic neoplasm such as squamous cell carcinoma.  Hypermetabolic mediastinal lymph nodes measuring up to 11 mm short axis, as above. Bilateral hilar regions are hypermetabolic on PET but poorly evaluated on unenhanced CT.  Emphysema and aortic atherosclerosis.      04/23/2017 Procedure    Bronchoscopy and  EBUS with mediastinal lymph node aspirations by Dr.  Roxan Hockey.      04/26/2017 Pathology Results    1. Lung, biopsy, Left upper lobe - ADENOCARCINOMA. - SEE COMMENT. 2. Lung, biopsy, Right lower lobe - ADENOCARCINOMA. - SEE COMMENT. 3. Lung, biopsy, Right lower lobe target 2 - ATYPICAL CELLS. - SEE COMMENT.      04/26/2017 Relapse/Recurrence         05/14/2017 Pathology Results    PDL1 NEGATIVE.  TPS 0%.      05/19/2017 Pathology Results    [employer] testing- MS-stable.  TMB 6 Muts/Mb (Intermediate).  STK11 E293*.  Negative for EGFR, KRAS, ALK, BRAF, MET, RET, ERBB2, and ROS1.  Potential treatment options: Atezolizumab/Durvalumab/Nivolumab/Pembrolizumab for TMB score.      07/27/2017 Imaging    CT C/A/P: IMPRESSION: Resolution of right lower lobe lesion and nodules.  Unchanged ill-defined nonspecific left lower lobe ground-glass opacity and stable mildly prominent mediastinal lymph nodes.  No evidence of metastatic disease or acute abnormality within the abdomen or pelvis.  Aortic Atherosclerosis (ICD10-I70.0) and Emphysema (ICD10-J43.9).      10/08/2017 Imaging    CT C/A/P: Similar appearance of the chest with an upper normal sized paratracheal lymph node, confluent ground-glass opacity in the left lower lobe, and severe centrilobular emphysema, but no findings of progressive or definite active malignancy.        INTERVAL HISTORY:  Ms. 84 y.o. returns for consideration of next cycle of therapy for metastatic lung cancer.   Overall, she tells me she has been feeling "pretty good."  She is eating well; her energy levels are "so-so."  After her last visit at the cancer center, she underwent CT neck on 11/12/17 for right neck mass, which was negative for adenopathy or tumor.  There were no imaging findings to correlate with physical exam findings at last office visit.  Her breathing has improved since recent hospitalization from 12/03/17 - 12/11/17 for COPD exacerbation.  She feels like her shortness of breath is  back to baseline.  She remains on 2-3 L O2 via nasal cannula.  Denies any fever/chills, rash, or diarrhea.  She denies any bleeding episodes including blood in her stools, dark/tarry stools, hematuria, vaginal bleeding, or gingival bleeding.  Her biggest complaint today is bilateral hand cramping; she is tried mustard at home for this which has not been very helpful.  She also reports that she has "crackling sound in my right ear."  She denies any ear pain; this was evaluated by her PCP per patient and (he told me he did not see anything wrong."  Otherwise she is largely without complaints today, and feels ready for her next cycle of therapy today.   Therapy  Dates  Tecentriq every 21 days 05/28/17-present      REVIEW OF SYSTEMS:  Review of Systems  Constitutional: Positive for fatigue. Negative for chills and fever.  Eyes: Negative.   Respiratory: Positive for shortness of breath (Chronic).   Cardiovascular: Negative.   Gastrointestinal: Negative.  Negative for blood in stool, constipation, diarrhea, nausea and vomiting.  Endocrine: Negative.   Genitourinary: Negative.  Negative for hematuria and vaginal bleeding.   Musculoskeletal: Positive for myalgias.  Skin: Negative.  Negative for rash.  Hematological: Negative.   Psychiatric/Behavioral: Negative.      PAST MEDICAL/SURGICAL HISTORY:  Past Medical History:  Diagnosis Date  . Adenocarcinoma of lung ([place])    Left lung 2009, resected  . Anginal pain ([place])   . Arthritis   . Back pain   .  CHF (congestive heart failure) ([place])   . COPD (chronic obstructive pulmonary disease) ([place])   . Diverticulitis   . Dyslipidemia   . Essential hypertension   . H/O ventral hernia   . Non-obstructive CAD    a. 04/2015 NSTEMI/Cath: LAD 10p, LCX 63m RCA 34m20d, EF 35-40 w/ apical ballooning.  . On home O2    3L N/C  . Osteoporosis   . Osteoporosis 11/04/2015   Managed by Dr. FaLegrand Rams . Parkinson's disease ([facility]  . Shortness of breath    . Takotsubo cardiomyopathy    a. 04/2015 Echo: EF 45-50%, mid-dist anterior/apical/inferoapical HK w/ hyperdynamic base. Gr 1 DD, mild AI, mild-mod MR, triv TR, PASP 4877m;  b. 04/2015 LV gram: Ef 35-40% w/ apical ballooning.  . Type II diabetes mellitus ([facility] . Ventricular bigeminy    a. 04/2015 in setting of NSTEMI/Takotsubo.   Past Surgical History:  Procedure Laterality Date  . ABDOMINAL HYSTERECTOMY    . CARDIAC CATHETERIZATION N/A 04/17/2015   Procedure: Left Heart Cath and Coronary Angiography;  Surgeon: ThoTroy SineD;  Location: [facility];  Service: Cardiovascular;  Laterality: N/A;  . CHOLECYSTECTOMY    . COLONOSCOPY N/A 09/18/2014   Procedure: COLONOSCOPY;  Surgeon: SanDanie BinderD;  Location: [facility] SUITE;  Service: Endoscopy;  Laterality: N/A;  8:30 AM - moved to 10:30 - CRosendo Gros notify pt  . ECTOPIC PREGNANCY SURGERY    . INCISIONAL HERNIA REPAIR N/A 08/26/2013   Procedure: HERNIA REPAIR INCISIONAL WITH MESH;  Surgeon: MarJamesetta SoD;  Location: [facility] ORS;  Service: General;  Laterality: N/A;  . IR FLUORO GUIDE PORT INSERTION RIGHT  04/30/2017  . IR US [place] VASC ACCESS RIGHT  04/30/2017  . LUNG CANCER SURGERY    . VIDEO BRONCHOSCOPY WITH ENDOBRONCHIAL NAVIGATION N/A 04/23/2017   Procedure: VIDEO BRONCHOSCOPY WITH ENDOBRONCHIAL NAVIGATION;  Surgeon: HenMelrose NakayamaD;  [facility];  Laterality: N/A;  . VIDEO BRONCHOSCOPY WITH ENDOBRONCHIAL ULTRASOUND N/A 04/23/2017   Procedure: VIDEO BRONCHOSCOPY WITH ENDOBRONCHIAL ULTRASOUND;  Surgeon: HenMelrose NakayamaD;  Location: [name];  Service: Thoracic;  Laterality: N/A;     SOCIAL HISTORY:  Social History   Socioeconomic History  . Marital status: Widowed    Spouse name: Not on file  . Number of children: Not on file  . Years of education: Not on file  . Highest education level: Not on file  Social Needs  . Financial resource strain: Not on file  . Food insecurity -  worry: Not on file  . Food insecurity - inability: Not on file  . Transportation needs - medical: Not on file  . Transportation needs - non-medical: Not on file  Occupational History  . Not on file  Tobacco Use  . Smoking status: Former Smoker    Years: 20.00    Types: Cigarettes    Last attempt to quit: 08/13/2006    Years since quitting: 11.3  . Smokeless tobacco: Never Used  Substance and Sexual Activity  . Alcohol use: No    Alcohol/week: 0.0 oz  . Drug use: No  . Sexual activity: Not Currently    Birth control/protection: Surgical  Other Topics Concern  . Not on file  Social History Narrative  . Not on file    FAMILY HISTORY:  Family History  Problem Relation Age of Onset  . Diabetes Mother   . Hypertension Mother   . Diabetes  Father   . Asthma Unknown   . Cancer Unknown   . Heart attack Sister        X2  . Colon cancer Neg Hx     CURRENT MEDICATIONS:  Outpatient Encounter Medications as of 12/24/2017  Medication Sig Note  . albuterol (PROAIR HFA) 108 (90 BASE) MCG/ACT inhaler Inhale 2 puffs into the lungs every 4 (four) hours as needed for wheezing or shortness of breath.   Marland Kitchen alendronate (FOSAMAX) 70 MG tablet Take 70 mg by mouth every Friday.  12/04/2017: Patient due for a dose today  . aspirin EC 81 MG tablet Take 81 mg by mouth daily.   Huey Bienenstock (TECENTRIQ IV) Inject into the vein. Every 3 weeks   . atorvastatin (LIPITOR) 40 MG tablet Take 40 mg by mouth at bedtime.    Marland Kitchen buPROPion (WELLBUTRIN SR) 150 MG 12 hr tablet Take 150 mg by mouth 2 (two) times daily.   . carbidopa-levodopa (SINEMET IR) 25-100 MG tablet Take 1 tablet by mouth 2 (two) times daily.   . cholecalciferol (VITAMIN D) 1000 units tablet Take 1,000 Units by mouth daily.   . clotrimazole-betamethasone (LOTRISONE) cream Apply 1 application topically 2 (two) times daily. 12/04/2017: Patient still has if needed but has not taken recently  . doxycycline (VIBRA-TABS) 100 MG tablet Take 1 tablet  (100 mg total) by mouth every 12 (twelve) hours.   . furosemide (LASIX) 40 MG tablet Take 1 tablet (40 mg total) by mouth daily. Pt is able to take one additional tablet daily for weight increases of 3lbs in a day or 5lbs in a week. Resume on 10/20   . gabapentin (NEURONTIN) 300 MG capsule Take 600 mg by mouth at bedtime.    . Garlic 662 MG TABS Take 300 mg by mouth daily.   Marland Kitchen guaiFENesin (MUCINEX) 600 MG 12 hr tablet Take 1 tablet (600 mg total) by mouth 2 (two) times daily.   Marland Kitchen ipratropium-albuterol (DUONEB) 0.5-2.5 (3) MG/3ML SOLN Inhale 3 mLs into the lungs every 6 (six) hours as needed (for wheezing/shortness of breath).    Marland Kitchen KLOR-CON 10 10 MEQ tablet TAKE 1 TABLET (10 MEQ TOTAL) BY MOUTH DAILY. *FURTHER REFILLS NEED TO BE AUTHORIZED BY PCP*   . LANTUS SOLOSTAR 100 UNIT/ML Solostar Pen Inject 15 Units into the skin at bedtime.    . lidocaine-prilocaine (EMLA) cream Apply to affected area once   . linagliptin (TRADJENTA) 5 MG TABS tablet Take 5 mg by mouth daily.   . meclizine (ANTIVERT) 25 MG tablet Take 25 mg by mouth daily.    . metoprolol succinate (TOPROL-XL) 25 MG 24 hr tablet Take 1 tablet (25 mg total) by mouth daily.   . nitroGLYCERIN (NITROSTAT) 0.4 MG SL tablet Place 0.4 mg under the tongue every 5 (five) minutes as needed for chest pain.   . Omega-3 Fatty Acids (FISH OIL PO) Take 1 capsule by mouth daily.   Marland Kitchen omeprazole (PRILOSEC) 40 MG capsule Take 40 mg by mouth daily.   . ondansetron (ZOFRAN) 8 MG tablet Take 1 tablet (8 mg total) by mouth 2 (two) times daily as needed (Nausea or vomiting).   . OXYGEN Inhale 3 L into the lungs continuous.   . predniSONE (DELTASONE) 10 MG tablet Take 6 tablets (60 mg total) by mouth daily with breakfast. And decrease by one tablet daily   . prochlorperazine (COMPAZINE) 10 MG tablet Take 1 tablet (10 mg total) by mouth every 6 (six) hours as needed (Nausea or  vomiting).   . pyridoxine (B-6) 100 MG tablet Take 100 mg by mouth daily.    Marland Kitchen  rOPINIRole (REQUIP) 0.5 MG tablet Take 0.5 mg by mouth 2 (two) times daily.    . sodium chloride (OCEAN) 0.65 % SOLN nasal spray Place 1 spray into both nostrils every 3 (three) hours as needed for congestion.    . traMADol (ULTRAM) 50 MG tablet Take 50 mg by mouth every 12 (twelve) hours as needed for moderate pain.  12/04/2017: Patient does not take often as it does not seem to help with pain  . zolpidem (AMBIEN) 5 MG tablet Take 5 mg by mouth at bedtime as needed for sleep.    No facility-administered encounter medications on file as of 12/24/2017.     ALLERGIES:  Allergies  Allergen Reactions  . Lisinopril Cough     PHYSICAL EXAM:  ECOG Performance status: 1 - 2 - Symptomatic; may require occasional assistance      Physical Exam  Constitutional: She is oriented to person, place, and time and well-developed, well-nourished, and in no distress.  HENT:  Head: Normocephalic.  Mouth/Throat: Oropharynx is clear and moist.  Eyes: Conjunctivae are normal. No scleral icterus.  Neck: Normal range of motion. Neck supple.  Cardiovascular: Normal rate and regular rhythm.  Pulmonary/Chest: Effort normal. She has wheezes (mild wheezes).  Rhonchi noted to all lung fields (chronic)  Abdominal: Soft. Bowel sounds are normal. There is no tenderness.  Musculoskeletal: Normal range of motion. She exhibits no edema.  Lymphadenopathy:    She has no cervical adenopathy.       Right: No supraclavicular adenopathy present.       Left: No supraclavicular adenopathy present.  Neurological: She is alert and oriented to person, place, and time.  Skin: Skin is warm and dry. No rash noted.  Psychiatric: Mood, memory, affect and judgment normal.  Nursing note and vitals reviewed.    LABORATORY DATA:  I have reviewed the labs as listed.  CBC    Component Value Date/Time   WBC 7.8 12/24/2017 0958   RBC 3.96 12/24/2017 0958   HGB 11.6 (L) 12/24/2017 0958   HCT 37.5 12/24/2017 0958   PLT 99 (L)  12/24/2017 0958   MCV 94.7 12/24/2017 0958   MCH 29.3 12/24/2017 0958   MCHC 30.9 12/24/2017 0958   RDW 15.9 (H) 12/24/2017 0958   LYMPHSABS 1.0 12/24/2017 0958   MONOABS 0.5 12/24/2017 0958   EOSABS 0.1 12/24/2017 0958   BASOSABS 0.0 12/24/2017 0958   CMP Latest Ref Rng & Units 12/24/2017 12/10/2017 12/08/2017  Glucose 65 - 99 mg/dL 165(H) 215(H) 241(H)  BUN 6 - 20 mg/dL 15 35(H) 28(H)  Creatinine 0.44 - 1.00 mg/dL 1.28(H) 1.45(H) 1.34(H)  Sodium 135 - 145 mmol/L 135 139 135  Potassium 3.5 - 5.1 mmol/L 4.2 4.4 4.7  Chloride 101 - 111 mmol/L 100(L) 101 102  CO2 22 - 32 mmol/L '26 28 24  ' Calcium 8.9 - 10.3 mg/dL 9.0 8.9 8.7(L)  Total Protein 6.5 - 8.1 g/dL 6.4(L) - -  Total Bilirubin 0.3 - 1.2 mg/dL 0.8 - -  Alkaline Phos 38 - 126 U/L 44 - -  AST 15 - 41 U/L 24 - -  ALT 14 - 54 U/L 27 - -     DIAGNOSTIC IMAGING:  Last restaging scans Brief summary of findings Next restaging scans due  CT chest/abd/pelvis: 10/08/17 IMPRESSION: 1. Similar appearance of the chest with an upper normal sized paratracheal lymph node, confluent ground-glass  opacity in the left lower lobe, and severe centrilobular emphysema, but no findings of progressive or definite active malignancy. 2. Other imaging findings of potential clinical significance: Aortic Atherosclerosis (ICD10-I70.0) and Emphysema (ICD10-J43.9). Coronary atherosclerosis. Small type 1 hiatal hernia. Descending and sigmoid colon diverticulosis. Severe left and moderate right degenerative hip arthropathy.  Feb/March 2019         ASSESSMENT & PLAN:    Stage IV lung cancer:  -Due for next cycle of Tecentriq today. Tolerating well.  -Last restaging scans in 10/2017 showed stable disease without evidence of progression.  She has significant lung disease (emphysema/COPD).  Will likely need restaging scans sometime in February/March 2019; will place orders at subsequent follow-up visit.   Encounter for antineoplastic therapy:  -Shortness  of breath improved since recent hospitalization for COPD exacerbation; her breathing is back to her baseline per her report.  Remains on O2.  -Labs reviewed today and are adequate for treatment. Her BUN/CRE are improved; creatinine remains mildly elevated, but BUN has normalized. Encouraged her to push oral fluids as tolerated.  Mild thrombocytopenia appreciated today as well with plt count 99,000. Could be d/t recent antibiotics and COPD exacerbation; she has not had any concerning bleeding; will continue to monitor for now. Proceed with Tecentriq as scheduled today.   Myalgias/Hand cramps:  -Not likely secondary to electrolyte imbalance or immunotherapy.   -Recommended she try tonic water with quinine to see if this is helpful. Instructed her to call me to let me know if her symptoms didn't improve and could consider low-dose muscle relaxant.   (R) ear "crackling":  -Could be inner ear dysfunction/effusion.  Encouraged her to try OTC anti-histamine, like Claritin, to see if this would be helpful. If no relief, then recommend she return to PCP for further evaluation.        Dispo:  -Return to cancer center in 3 weeks for follow-up and next cycle of Tecentriq.     All questions were answered to patient's stated satisfaction. Encouraged patient to call with any new concerns or questions before her next visit to the cancer center and we can certain see her sooner, if needed.     Plan of care discussed with Dr. Sherrine Maples, who agrees with the above aforementioned.     Orders placed this encounter:  No orders of the defined types were placed in this encounter.     Mike Craze, NP [place] [phone]

## 2017-12-25 DIAGNOSIS — J441 Chronic obstructive pulmonary disease with (acute) exacerbation: Secondary | ICD-10-CM | POA: Diagnosis not present

## 2017-12-25 DIAGNOSIS — J449 Chronic obstructive pulmonary disease, unspecified: Secondary | ICD-10-CM | POA: Diagnosis not present

## 2017-12-25 DIAGNOSIS — J9621 Acute and chronic respiratory failure with hypoxia: Secondary | ICD-10-CM | POA: Diagnosis not present

## 2017-12-25 DIAGNOSIS — N183 Chronic kidney disease, stage 3 (moderate): Secondary | ICD-10-CM | POA: Diagnosis not present

## 2017-12-25 DIAGNOSIS — Z79891 Long term (current) use of opiate analgesic: Secondary | ICD-10-CM | POA: Diagnosis not present

## 2017-12-25 DIAGNOSIS — I5032 Chronic diastolic (congestive) heart failure: Secondary | ICD-10-CM | POA: Diagnosis not present

## 2017-12-25 DIAGNOSIS — R1310 Dysphagia, unspecified: Secondary | ICD-10-CM | POA: Diagnosis not present

## 2017-12-25 DIAGNOSIS — Z9981 Dependence on supplemental oxygen: Secondary | ICD-10-CM | POA: Diagnosis not present

## 2017-12-25 DIAGNOSIS — I130 Hypertensive heart and chronic kidney disease with heart failure and stage 1 through stage 4 chronic kidney disease, or unspecified chronic kidney disease: Secondary | ICD-10-CM | POA: Diagnosis not present

## 2017-12-25 DIAGNOSIS — Z794 Long term (current) use of insulin: Secondary | ICD-10-CM | POA: Diagnosis not present

## 2017-12-25 DIAGNOSIS — C3490 Malignant neoplasm of unspecified part of unspecified bronchus or lung: Secondary | ICD-10-CM | POA: Diagnosis not present

## 2017-12-25 DIAGNOSIS — Z7982 Long term (current) use of aspirin: Secondary | ICD-10-CM | POA: Diagnosis not present

## 2017-12-25 DIAGNOSIS — Z7951 Long term (current) use of inhaled steroids: Secondary | ICD-10-CM | POA: Diagnosis not present

## 2017-12-25 DIAGNOSIS — E1122 Type 2 diabetes mellitus with diabetic chronic kidney disease: Secondary | ICD-10-CM | POA: Diagnosis not present

## 2017-12-30 DIAGNOSIS — J441 Chronic obstructive pulmonary disease with (acute) exacerbation: Secondary | ICD-10-CM | POA: Diagnosis not present

## 2017-12-30 DIAGNOSIS — Z7951 Long term (current) use of inhaled steroids: Secondary | ICD-10-CM | POA: Diagnosis not present

## 2017-12-30 DIAGNOSIS — N183 Chronic kidney disease, stage 3 (moderate): Secondary | ICD-10-CM | POA: Diagnosis not present

## 2017-12-30 DIAGNOSIS — I5032 Chronic diastolic (congestive) heart failure: Secondary | ICD-10-CM | POA: Diagnosis not present

## 2017-12-30 DIAGNOSIS — I130 Hypertensive heart and chronic kidney disease with heart failure and stage 1 through stage 4 chronic kidney disease, or unspecified chronic kidney disease: Secondary | ICD-10-CM | POA: Diagnosis not present

## 2017-12-30 DIAGNOSIS — Z794 Long term (current) use of insulin: Secondary | ICD-10-CM | POA: Diagnosis not present

## 2017-12-30 DIAGNOSIS — Z7982 Long term (current) use of aspirin: Secondary | ICD-10-CM | POA: Diagnosis not present

## 2017-12-30 DIAGNOSIS — Z79891 Long term (current) use of opiate analgesic: Secondary | ICD-10-CM | POA: Diagnosis not present

## 2017-12-30 DIAGNOSIS — J9621 Acute and chronic respiratory failure with hypoxia: Secondary | ICD-10-CM | POA: Diagnosis not present

## 2017-12-30 DIAGNOSIS — C3490 Malignant neoplasm of unspecified part of unspecified bronchus or lung: Secondary | ICD-10-CM | POA: Diagnosis not present

## 2017-12-30 DIAGNOSIS — R1310 Dysphagia, unspecified: Secondary | ICD-10-CM | POA: Diagnosis not present

## 2017-12-30 DIAGNOSIS — Z9981 Dependence on supplemental oxygen: Secondary | ICD-10-CM | POA: Diagnosis not present

## 2017-12-30 DIAGNOSIS — E1122 Type 2 diabetes mellitus with diabetic chronic kidney disease: Secondary | ICD-10-CM | POA: Diagnosis not present

## 2018-01-01 DIAGNOSIS — J449 Chronic obstructive pulmonary disease, unspecified: Secondary | ICD-10-CM | POA: Diagnosis not present

## 2018-01-08 DIAGNOSIS — R569 Unspecified convulsions: Secondary | ICD-10-CM | POA: Diagnosis not present

## 2018-01-13 DIAGNOSIS — G894 Chronic pain syndrome: Secondary | ICD-10-CM | POA: Diagnosis not present

## 2018-01-13 DIAGNOSIS — G253 Myoclonus: Secondary | ICD-10-CM | POA: Diagnosis not present

## 2018-01-13 DIAGNOSIS — R2689 Other abnormalities of gait and mobility: Secondary | ICD-10-CM | POA: Diagnosis not present

## 2018-01-13 DIAGNOSIS — G20 Parkinson's disease: Secondary | ICD-10-CM | POA: Diagnosis not present

## 2018-01-14 ENCOUNTER — Inpatient Hospital Stay (HOSPITAL_COMMUNITY): Payer: Medicare Other | Attending: Adult Health | Admitting: Internal Medicine

## 2018-01-14 ENCOUNTER — Other Ambulatory Visit: Payer: Self-pay

## 2018-01-14 ENCOUNTER — Ambulatory Visit (HOSPITAL_COMMUNITY): Payer: Medicare Other | Admitting: Adult Health

## 2018-01-14 ENCOUNTER — Encounter (HOSPITAL_COMMUNITY): Payer: Self-pay

## 2018-01-14 ENCOUNTER — Inpatient Hospital Stay (HOSPITAL_COMMUNITY): Payer: Medicare Other | Attending: Oncology

## 2018-01-14 ENCOUNTER — Encounter (HOSPITAL_COMMUNITY): Payer: Self-pay | Admitting: Internal Medicine

## 2018-01-14 VITALS — BP 130/53 | HR 71 | Temp 97.9°F | Resp 18 | Wt 176.0 lb

## 2018-01-14 DIAGNOSIS — C3412 Malignant neoplasm of upper lobe, left bronchus or lung: Secondary | ICD-10-CM

## 2018-01-14 DIAGNOSIS — I2510 Atherosclerotic heart disease of native coronary artery without angina pectoris: Secondary | ICD-10-CM

## 2018-01-14 DIAGNOSIS — H449 Unspecified disorder of globe: Secondary | ICD-10-CM

## 2018-01-14 DIAGNOSIS — Z7982 Long term (current) use of aspirin: Secondary | ICD-10-CM

## 2018-01-14 DIAGNOSIS — G20 Parkinson's disease: Secondary | ICD-10-CM | POA: Diagnosis not present

## 2018-01-14 DIAGNOSIS — Z79899 Other long term (current) drug therapy: Secondary | ICD-10-CM | POA: Insufficient documentation

## 2018-01-14 DIAGNOSIS — Z85118 Personal history of other malignant neoplasm of bronchus and lung: Secondary | ICD-10-CM | POA: Diagnosis not present

## 2018-01-14 DIAGNOSIS — Z902 Acquired absence of lung [part of]: Secondary | ICD-10-CM

## 2018-01-14 DIAGNOSIS — Z7189 Other specified counseling: Secondary | ICD-10-CM

## 2018-01-14 DIAGNOSIS — D649 Anemia, unspecified: Secondary | ICD-10-CM

## 2018-01-14 DIAGNOSIS — Z5112 Encounter for antineoplastic immunotherapy: Secondary | ICD-10-CM | POA: Insufficient documentation

## 2018-01-14 DIAGNOSIS — C3490 Malignant neoplasm of unspecified part of unspecified bronchus or lung: Secondary | ICD-10-CM

## 2018-01-14 LAB — COMPREHENSIVE METABOLIC PANEL
ALT: 5 U/L — ABNORMAL LOW (ref 14–54)
AST: 23 U/L (ref 15–41)
Albumin: 3.5 g/dL (ref 3.5–5.0)
Alkaline Phosphatase: 48 U/L (ref 38–126)
Anion gap: 9 (ref 5–15)
BUN: 17 mg/dL (ref 6–20)
CO2: 26 mmol/L (ref 22–32)
Calcium: 9.4 mg/dL (ref 8.9–10.3)
Chloride: 104 mmol/L (ref 101–111)
Creatinine, Ser: 1.08 mg/dL — ABNORMAL HIGH (ref 0.44–1.00)
GFR calc Af Amer: 54 mL/min — ABNORMAL LOW (ref >60)
GFR calc non Af Amer: 47 mL/min — ABNORMAL LOW (ref >60)
Glucose, Bld: 159 mg/dL — ABNORMAL HIGH (ref 65–99)
Potassium: 3.8 mmol/L (ref 3.5–5.1)
Sodium: 139 mmol/L (ref 135–145)
Total Bilirubin: 0.5 mg/dL (ref 0.3–1.2)
Total Protein: 6.9 g/dL (ref 6.5–8.1)

## 2018-01-14 LAB — CBC WITH DIFFERENTIAL/PLATELET
Basophils Absolute: 0 10*3/uL (ref 0.0–0.1)
Basophils Relative: 1 %
Eosinophils Absolute: 0.1 10*3/uL (ref 0.0–0.7)
Eosinophils Relative: 1 %
HCT: 36.1 % (ref 36.0–46.0)
Hemoglobin: 11 g/dL — ABNORMAL LOW (ref 12.0–15.0)
Lymphocytes Relative: 19 %
Lymphs Abs: 1.6 10*3/uL (ref 0.7–4.0)
MCH: 28.9 pg (ref 26.0–34.0)
MCHC: 30.5 g/dL (ref 30.0–36.0)
MCV: 94.8 fL (ref 78.0–100.0)
Monocytes Absolute: 0.8 10*3/uL (ref 0.1–1.0)
Monocytes Relative: 10 %
Neutro Abs: 6 10*3/uL (ref 1.7–7.7)
Neutrophils Relative %: 69 %
Platelets: 236 10*3/uL (ref 150–400)
RBC: 3.81 MIL/uL — ABNORMAL LOW (ref 3.87–5.11)
RDW: 16.2 % — ABNORMAL HIGH (ref 11.5–15.5)
WBC: 8.5 10*3/uL (ref 4.0–10.5)

## 2018-01-14 MED ORDER — SODIUM CHLORIDE 0.9% FLUSH
10.0000 mL | INTRAVENOUS | Status: DC | PRN
  Administered 2018-01-14: 10 mL
  Filled 2018-01-14: qty 10

## 2018-01-14 MED ORDER — SODIUM CHLORIDE 0.9 % IV SOLN
1200.0000 mg | Freq: Once | INTRAVENOUS | Status: AC
  Administered 2018-01-14: 1200 mg via INTRAVENOUS
  Filled 2018-01-14: qty 20

## 2018-01-14 MED ORDER — ACETAMINOPHEN 325 MG PO TABS
650.0000 mg | ORAL_TABLET | Freq: Once | ORAL | Status: AC
Start: 2018-01-14 — End: 2018-01-14
  Administered 2018-01-14: 650 mg via ORAL

## 2018-01-14 MED ORDER — HEPARIN SOD (PORK) LOCK FLUSH 100 UNIT/ML IV SOLN
500.0000 [IU] | Freq: Once | INTRAVENOUS | Status: AC | PRN
  Administered 2018-01-14: 500 [IU]

## 2018-01-14 MED ORDER — ACETAMINOPHEN 325 MG PO TABS
ORAL_TABLET | ORAL | Status: AC
  Filled 2018-01-14: qty 1

## 2018-01-14 MED ORDER — SODIUM CHLORIDE 0.9 % IV SOLN
Freq: Once | INTRAVENOUS | Status: AC
  Administered 2018-01-14: 11:00:00 via INTRAVENOUS

## 2018-01-14 NOTE — Patient Instructions (Signed)
[facility] Discharge Instructions for Patients Receiving Chemotherapy   Beginning January 23rd 2017 lab work for the [facility] will be done in the  Main lab at [facility] on 1st floor. If you have a lab appointment with the [facility] please come in thru the  Main Entrance and check in at the main information desk   Today you received the following chemotherapy agents Tecentriq. Follow-up as scheduled. Call clinic for any questions or concerns  To help prevent nausea and vomiting after your treatment, we encourage you to take your nausea medication   If you develop nausea and vomiting, or diarrhea that is not controlled by your medication, call the clinic.  The clinic phone number is [phone]. Office hours are Monday-Friday 8:30am-5:00pm.  BELOW ARE SYMPTOMS THAT SHOULD BE REPORTED IMMEDIATELY:  *FEVER GREATER THAN 101.0 F  *CHILLS WITH OR WITHOUT FEVER  NAUSEA AND VOMITING THAT IS NOT CONTROLLED WITH YOUR NAUSEA MEDICATION  *UNUSUAL SHORTNESS OF BREATH  *UNUSUAL BRUISING OR BLEEDING  TENDERNESS IN MOUTH AND THROAT WITH OR WITHOUT PRESENCE OF ULCERS  *URINARY PROBLEMS  *BOWEL PROBLEMS  UNUSUAL RASH Items with * indicate a potential emergency and should be followed up as soon as possible. If you have an emergency after office hours please contact your primary care physician or go to the nearest emergency department.  Please call the clinic during office hours if you have any questions or concerns.   You may also contact the Patient Navigator at [phone] should you have any questions or need assistance in obtaining follow up care.      Resources For Cancer Patients and their Caregivers ? American Cancer Society: Can assist with transportation, wigs, general needs, runs Look Good Feel Better.        (612) 709-5743 ? Cancer Care: Provides financial assistance, online support groups, medication/co-pay assistance.  1-800-813-HOPE  (561)404-0556) ? [place] Assists [place] cancer patients and their families through emotional , educational and financial support.  902-879-4675 ? [place] DSS Where to apply for food stamps, Medicaid and utility assistance. (901)767-8670 ? [facility]: Transportation to medical appointments. 914 817 8076 ? Social Security Administration: May apply for disability if have a Stage IV cancer. 978-014-6725 918 760 8571 ? [employer], Disability and Transit Services: Assists with nutrition, care and transit needs. 727-104-7732

## 2018-01-14 NOTE — Progress Notes (Signed)
Angela Lawson tolerated Tecentriq infusion well without complaints or incident. Labs reviewed with and pt seen by Dr. Sherrine Maples prior to administering this medication. Tylenol 2 tabs 350 mg each given to pt for c/o of shoulder pain which was effective. VSS upon discharge. Pt discharged via wheelchair in satisfactory condition accompanied by her daughter

## 2018-01-15 DIAGNOSIS — E1165 Type 2 diabetes mellitus with hyperglycemia: Secondary | ICD-10-CM | POA: Diagnosis not present

## 2018-01-15 DIAGNOSIS — J449 Chronic obstructive pulmonary disease, unspecified: Secondary | ICD-10-CM | POA: Diagnosis not present

## 2018-01-15 DIAGNOSIS — C3490 Malignant neoplasm of unspecified part of unspecified bronchus or lung: Secondary | ICD-10-CM | POA: Diagnosis not present

## 2018-01-15 DIAGNOSIS — J9611 Chronic respiratory failure with hypoxia: Secondary | ICD-10-CM | POA: Diagnosis not present

## 2018-01-15 DIAGNOSIS — I5032 Chronic diastolic (congestive) heart failure: Secondary | ICD-10-CM | POA: Diagnosis not present

## 2018-01-20 DIAGNOSIS — E1151 Type 2 diabetes mellitus with diabetic peripheral angiopathy without gangrene: Secondary | ICD-10-CM | POA: Diagnosis not present

## 2018-01-20 DIAGNOSIS — E1140 Type 2 diabetes mellitus with diabetic neuropathy, unspecified: Secondary | ICD-10-CM | POA: Diagnosis not present

## 2018-01-20 DIAGNOSIS — B351 Tinea unguium: Secondary | ICD-10-CM | POA: Diagnosis not present

## 2018-01-20 DIAGNOSIS — L110 Acquired keratosis follicularis: Secondary | ICD-10-CM | POA: Diagnosis not present

## 2018-01-25 DIAGNOSIS — J449 Chronic obstructive pulmonary disease, unspecified: Secondary | ICD-10-CM | POA: Diagnosis not present

## 2018-01-25 NOTE — Progress Notes (Signed)
Chief complaint: New/recurrent adenocarcinoma of the left and right lung; h/o Stage IA NSCLC s/p resection in 2009.   HPI  In July 2009 she had a poorly differentiated adenocarcinoma that was resected in [place].  Details of this are not known.  In March 2015 she presented with a right lower lobe nodule.  Over the next 3 years she was followed with serial imaging.  In February 2018 there was progression of groundglass opacity within the left lung and no new nodules within the right lower lobe were noted.  Hypermetabolic mediastinal and hilar lymphadenopathy was also noted.  In April 2018 she developed a 2.2 cm cavitary lesion within the right lower lobe with surrounding hypermetabolic nodules in addition to the 5.9 cm groundglass appearance in the left lower lobe which was also hypermetabolic.  She underwent biopsy of the left upper lobe and right lower lobe.  That confirmed adenocarcinoma  Molecular pathology as noted below:   PDL1 NEGATIVE.  TPS 0%.        05/19/2017 Pathology Results    [employer] testing- MS-stable.  TMB 6 Muts/Mb (Intermediate).  STK11 E293*.  Negative for EGFR, KRAS, ALK, BRAF, MET, RET, ERBB2, and ROS1.  Potential treatment options: Atezolizumab/Durvalumab/Nivolumab/Pembrolizumab for TMB score.    Tecentrq initiated in 05/2017    Interval history:  Patient is being seen to assess for continuation of Tecentriq therapy. Patient has a history of COPD she is on 3 L of oxygen continuously.  She also is steroid dependent and takes 60 mg of prednisone every day. She takes alendronate once a week for osteoporosis.  And is on Sinemet for Parkinson's disease. She is a history of coronary artery disease currently on aspirin and atorvastatin.  She takes vitamin D and calcium as well.   A CT scan from 11/12/2017 of the neck was ordered because of a suspicious neck mass that did not reveal any mass or adenopathy in the neck. A prior CT chest /abdomen pelvis from 11/18  showed slightly enlarged right paratracheal lymph node and confluent groundglass opacity in the left lower lobe with centrilobular severe emphysema with no definite signs of malignancy. A stable subpleural left upper lobe nodule measuring 0.6 x 0.4 cm was noted in the same area  CMP today shows stable but elevated serum creatinine at 1.08 otherwise unremarkable liver enzymes are normal.  CBC is stable except for mild anemia with hemoglobin 11.  Her last TSH from December 24, 2017 was normal.  Current medications, past medical history have been reviewed.  Vitals are stable today Exam shows no acute distress, no pallor, icterus, No extrEmeity edema No respiratory distress at rest No palpable neck masses AAOX3,  Assessment and plan: Metastatic adenocarcinoma lung- stable for infusion of Tecentriq today. No immune mediated side effects are suspected today    Return for MD visit and next infusion in 3 weeks.   Repeat CBC, CMP, TSH next visit Next imagine of CT chest/abd and pelvis after next cycle of Tecntriq No visible neck mass today COPD- stable

## 2018-02-04 ENCOUNTER — Inpatient Hospital Stay (HOSPITAL_COMMUNITY): Payer: Medicare Other

## 2018-02-04 ENCOUNTER — Ambulatory Visit (HOSPITAL_COMMUNITY)
Admission: RE | Admit: 2018-02-04 | Discharge: 2018-02-04 | Disposition: A | Discharge status: Home / Self Care | Payer: Medicare Other | Source: Ambulatory Visit | Attending: Internal Medicine | Admitting: Internal Medicine

## 2018-02-04 ENCOUNTER — Ambulatory Visit (HOSPITAL_COMMUNITY): Admission: RE | Admit: 2018-02-04 | Payer: Medicare Other | Source: Ambulatory Visit

## 2018-02-04 ENCOUNTER — Encounter (HOSPITAL_COMMUNITY): Payer: Self-pay

## 2018-02-04 ENCOUNTER — Inpatient Hospital Stay (HOSPITAL_COMMUNITY): Payer: Medicare Other | Attending: Internal Medicine | Admitting: Internal Medicine

## 2018-02-04 ENCOUNTER — Other Ambulatory Visit: Payer: Self-pay

## 2018-02-04 ENCOUNTER — Encounter (HOSPITAL_COMMUNITY): Payer: Self-pay | Admitting: Internal Medicine

## 2018-02-04 ENCOUNTER — Emergency Department (HOSPITAL_COMMUNITY)
Admission: EM | Admit: 2018-02-04 | Discharge: 2018-02-04 | Disposition: A | Discharge status: Home / Self Care | Payer: Medicare Other | Source: Home / Self Care | Attending: Emergency Medicine | Admitting: Emergency Medicine

## 2018-02-04 VITALS — BP 151/67 | HR 80 | Temp 97.6°F | Resp 20

## 2018-02-04 DIAGNOSIS — R0602 Shortness of breath: Secondary | ICD-10-CM | POA: Diagnosis not present

## 2018-02-04 DIAGNOSIS — I700 Atherosclerosis of aorta: Secondary | ICD-10-CM | POA: Diagnosis not present

## 2018-02-04 DIAGNOSIS — C3490 Malignant neoplasm of unspecified part of unspecified bronchus or lung: Secondary | ICD-10-CM

## 2018-02-04 DIAGNOSIS — J449 Chronic obstructive pulmonary disease, unspecified: Secondary | ICD-10-CM | POA: Diagnosis not present

## 2018-02-04 DIAGNOSIS — R05 Cough: Secondary | ICD-10-CM | POA: Diagnosis not present

## 2018-02-04 DIAGNOSIS — Z7952 Long term (current) use of systemic steroids: Secondary | ICD-10-CM

## 2018-02-04 DIAGNOSIS — Z79899 Other long term (current) drug therapy: Secondary | ICD-10-CM | POA: Insufficient documentation

## 2018-02-04 DIAGNOSIS — E785 Hyperlipidemia, unspecified: Secondary | ICD-10-CM | POA: Diagnosis not present

## 2018-02-04 DIAGNOSIS — I509 Heart failure, unspecified: Secondary | ICD-10-CM

## 2018-02-04 DIAGNOSIS — E119 Type 2 diabetes mellitus without complications: Secondary | ICD-10-CM

## 2018-02-04 DIAGNOSIS — Z902 Acquired absence of lung [part of]: Secondary | ICD-10-CM | POA: Diagnosis not present

## 2018-02-04 DIAGNOSIS — Z794 Long term (current) use of insulin: Secondary | ICD-10-CM | POA: Insufficient documentation

## 2018-02-04 DIAGNOSIS — Z9221 Personal history of antineoplastic chemotherapy: Secondary | ICD-10-CM | POA: Diagnosis not present

## 2018-02-04 DIAGNOSIS — Z85118 Personal history of other malignant neoplasm of bronchus and lung: Secondary | ICD-10-CM | POA: Insufficient documentation

## 2018-02-04 DIAGNOSIS — R590 Localized enlarged lymph nodes: Secondary | ICD-10-CM | POA: Diagnosis not present

## 2018-02-04 DIAGNOSIS — Z87891 Personal history of nicotine dependence: Secondary | ICD-10-CM

## 2018-02-04 DIAGNOSIS — Z9981 Dependence on supplemental oxygen: Secondary | ICD-10-CM | POA: Diagnosis not present

## 2018-02-04 DIAGNOSIS — G20 Parkinson's disease: Secondary | ICD-10-CM | POA: Diagnosis not present

## 2018-02-04 DIAGNOSIS — Z888 Allergy status to other drugs, medicaments and biological substances status: Secondary | ICD-10-CM | POA: Diagnosis not present

## 2018-02-04 DIAGNOSIS — N183 Chronic kidney disease, stage 3 (moderate): Secondary | ICD-10-CM | POA: Insufficient documentation

## 2018-02-04 DIAGNOSIS — R600 Localized edema: Secondary | ICD-10-CM | POA: Diagnosis not present

## 2018-02-04 DIAGNOSIS — I10 Essential (primary) hypertension: Secondary | ICD-10-CM | POA: Diagnosis not present

## 2018-02-04 DIAGNOSIS — I5032 Chronic diastolic (congestive) heart failure: Secondary | ICD-10-CM | POA: Diagnosis not present

## 2018-02-04 DIAGNOSIS — M810 Age-related osteoporosis without current pathological fracture: Secondary | ICD-10-CM | POA: Insufficient documentation

## 2018-02-04 DIAGNOSIS — Z7982 Long term (current) use of aspirin: Secondary | ICD-10-CM | POA: Insufficient documentation

## 2018-02-04 DIAGNOSIS — C3412 Malignant neoplasm of upper lobe, left bronchus or lung: Secondary | ICD-10-CM | POA: Diagnosis not present

## 2018-02-04 DIAGNOSIS — R062 Wheezing: Secondary | ICD-10-CM | POA: Diagnosis not present

## 2018-02-04 DIAGNOSIS — J441 Chronic obstructive pulmonary disease with (acute) exacerbation: Secondary | ICD-10-CM | POA: Insufficient documentation

## 2018-02-04 DIAGNOSIS — I130 Hypertensive heart and chronic kidney disease with heart failure and stage 1 through stage 4 chronic kidney disease, or unspecified chronic kidney disease: Secondary | ICD-10-CM | POA: Insufficient documentation

## 2018-02-04 DIAGNOSIS — K5730 Diverticulosis of large intestine without perforation or abscess without bleeding: Secondary | ICD-10-CM | POA: Diagnosis not present

## 2018-02-04 DIAGNOSIS — I2510 Atherosclerotic heart disease of native coronary artery without angina pectoris: Secondary | ICD-10-CM | POA: Diagnosis not present

## 2018-02-04 DIAGNOSIS — Z5112 Encounter for antineoplastic immunotherapy: Secondary | ICD-10-CM | POA: Diagnosis not present

## 2018-02-04 DIAGNOSIS — I252 Old myocardial infarction: Secondary | ICD-10-CM | POA: Insufficient documentation

## 2018-02-04 DIAGNOSIS — I517 Cardiomegaly: Secondary | ICD-10-CM

## 2018-02-04 DIAGNOSIS — R748 Abnormal levels of other serum enzymes: Secondary | ICD-10-CM | POA: Diagnosis not present

## 2018-02-04 DIAGNOSIS — I110 Hypertensive heart disease with heart failure: Secondary | ICD-10-CM | POA: Diagnosis not present

## 2018-02-04 DIAGNOSIS — E1122 Type 2 diabetes mellitus with diabetic chronic kidney disease: Secondary | ICD-10-CM | POA: Insufficient documentation

## 2018-02-04 DIAGNOSIS — I5033 Acute on chronic diastolic (congestive) heart failure: Secondary | ICD-10-CM | POA: Diagnosis not present

## 2018-02-04 DIAGNOSIS — J9621 Acute and chronic respiratory failure with hypoxia: Secondary | ICD-10-CM | POA: Diagnosis not present

## 2018-02-04 DIAGNOSIS — Z7189 Other specified counseling: Secondary | ICD-10-CM

## 2018-02-04 DIAGNOSIS — Z66 Do not resuscitate: Secondary | ICD-10-CM | POA: Diagnosis not present

## 2018-02-04 DIAGNOSIS — R0902 Hypoxemia: Secondary | ICD-10-CM | POA: Diagnosis not present

## 2018-02-04 LAB — CBC WITH DIFFERENTIAL/PLATELET
Basophils Absolute: 0 10*3/uL (ref 0.0–0.1)
Basophils Relative: 0 %
Eosinophils Absolute: 0.6 10*3/uL (ref 0.0–0.7)
Eosinophils Relative: 7 %
HCT: 38.5 % (ref 36.0–46.0)
Hemoglobin: 11.5 g/dL — ABNORMAL LOW (ref 12.0–15.0)
Lymphocytes Relative: 19 %
Lymphs Abs: 1.7 10*3/uL (ref 0.7–4.0)
MCH: 28.4 pg (ref 26.0–34.0)
MCHC: 29.9 g/dL — ABNORMAL LOW (ref 30.0–36.0)
MCV: 95.1 fL (ref 78.0–100.0)
Monocytes Absolute: 0.8 10*3/uL (ref 0.1–1.0)
Monocytes Relative: 9 %
Neutro Abs: 5.8 10*3/uL (ref 1.7–7.7)
Neutrophils Relative %: 65 %
Platelets: 244 10*3/uL (ref 150–400)
RBC: 4.05 MIL/uL (ref 3.87–5.11)
RDW: 16.2 % — ABNORMAL HIGH (ref 11.5–15.5)
WBC: 9 10*3/uL (ref 4.0–10.5)

## 2018-02-04 LAB — COMPREHENSIVE METABOLIC PANEL
ALT: 6 U/L — ABNORMAL LOW (ref 14–54)
AST: 28 U/L (ref 15–41)
Albumin: 3.8 g/dL (ref 3.5–5.0)
Alkaline Phosphatase: 52 U/L (ref 38–126)
Anion gap: 12 (ref 5–15)
BUN: 17 mg/dL (ref 6–20)
CO2: 25 mmol/L (ref 22–32)
Calcium: 10 mg/dL (ref 8.9–10.3)
Chloride: 100 mmol/L — ABNORMAL LOW (ref 101–111)
Creatinine, Ser: 1.34 mg/dL — ABNORMAL HIGH (ref 0.44–1.00)
GFR calc Af Amer: 42 mL/min — ABNORMAL LOW (ref >60)
GFR calc non Af Amer: 36 mL/min — ABNORMAL LOW (ref >60)
Glucose, Bld: 148 mg/dL — ABNORMAL HIGH (ref 65–99)
Potassium: 4.1 mmol/L (ref 3.5–5.1)
Sodium: 137 mmol/L (ref 135–145)
Total Bilirubin: 0.5 mg/dL (ref 0.3–1.2)
Total Protein: 7.1 g/dL (ref 6.5–8.1)

## 2018-02-04 LAB — BRAIN NATRIURETIC PEPTIDE: B Natriuretic Peptide: 70 pg/mL (ref 0.0–100.0)

## 2018-02-04 LAB — TROPONIN I: Troponin I: 0.06 ng/mL (ref <0.03)

## 2018-02-04 LAB — TSH: TSH: 1.3 u[IU]/mL (ref 0.350–4.500)

## 2018-02-04 MED ORDER — BENZONATATE 100 MG PO CAPS: 100.0000 mg | ORAL_CAPSULE | Freq: Three times a day (TID) | ORAL | 0 refills | Status: DC

## 2018-02-04 MED ORDER — DOXYCYCLINE HYCLATE 100 MG PO CAPS: 100.0000 mg | ORAL_CAPSULE | Freq: Two times a day (BID) | ORAL | 0 refills | Status: DC

## 2018-02-04 MED ORDER — SODIUM CHLORIDE 0.9 % IV SOLN
20.0000 mg | Freq: Once | INTRAVENOUS | Status: AC
  Administered 2018-02-04: 20 mg via INTRAVENOUS
  Filled 2018-02-04: qty 2

## 2018-02-04 MED ORDER — ALBUTEROL (5 MG/ML) CONTINUOUS INHALATION SOLN
10.0000 mg/h | INHALATION_SOLUTION | Freq: Once | RESPIRATORY_TRACT | Status: AC
  Administered 2018-02-04: 10 mg/h via RESPIRATORY_TRACT
  Filled 2018-02-04: qty 20

## 2018-02-04 MED ORDER — PREDNISONE 20 MG PO TABS: 20.0000 mg | ORAL_TABLET | Freq: Two times a day (BID) | ORAL | 0 refills | Status: DC

## 2018-02-04 MED ORDER — HEPARIN SOD (PORK) LOCK FLUSH 100 UNIT/ML IV SOLN
500.0000 [IU] | Freq: Once | INTRAVENOUS | Status: AC
Start: 2018-02-04 — End: 2018-02-04
  Administered 2018-02-04: 500 [IU] via INTRAVENOUS

## 2018-02-04 MED ORDER — IPRATROPIUM BROMIDE 0.02 % IN SOLN
0.5000 mg | Freq: Once | RESPIRATORY_TRACT | Status: AC
  Administered 2018-02-04: 0.5 mg via RESPIRATORY_TRACT
  Filled 2018-02-04: qty 2.5

## 2018-02-04 MED ORDER — SODIUM CHLORIDE 0.9 % IV SOLN
INTRAVENOUS | Status: DC
  Administered 2018-02-04: 14:00:00 via INTRAVENOUS

## 2018-02-04 MED ORDER — SODIUM CHLORIDE 0.9 % IV SOLN
1200.0000 mg | Freq: Once | INTRAVENOUS | Status: AC
  Administered 2018-02-04: 1200 mg via INTRAVENOUS
  Filled 2018-02-04: qty 20

## 2018-02-04 NOTE — Discharge Instructions (Signed)
Inhaler, or nebulizer treatment every 4 hours at home Tessalon as needed for cough. Times 5 days, doxycycline times 10 days. Follow-up with your primary care physician or oncologist. Return to ER with any new or worsening symptoms

## 2018-02-04 NOTE — Progress Notes (Signed)
Tolerated infusion w/o adverse reaction.  Alert, in no distress.  VSS.  Discharged ambulatory.  

## 2018-02-04 NOTE — ED Notes (Signed)
CRITICAL VALUE ALERT  Critical Value troponin 0.06  Date & Time Notied: 02/04/18 1655  Provider Notified: Jeneen Rinks  Orders Received/Actions taken:

## 2018-02-04 NOTE — ED Provider Notes (Signed)
[facility] EMERGENCY DEPARTMENT Provider Note   CSN: [id number] Arrival date & time: 02/04/18  1535     History   Chief Complaint No chief complaint on file.   HPI [name] is a 84 y.o. female.  Chief complaint is shortness of breath  HPI: 84 year old female.  History of COPD.  Also history of recurrent adenocarcinoma of the lung.  Reports shortness of breath with coughing and wheezing for the last week.  Seen and evaluated at clinic for her infusion.  Was still coughing and feeling short of breath and was referred here for status post her treatment.  Had an x-ray there that was normal.  No chest pain.  No hemoptysis.  No fever.  Wheezing.  Daughter states they have found some mold in the home that is being removed.  Past Medical History:  Diagnosis Date  . Adenocarcinoma of lung ([place])    Left lung 2009, resected  . Anginal pain ([place])   . Arthritis   . Back pain   . CHF (congestive heart failure) ([place])   . COPD (chronic obstructive pulmonary disease) ([place])   . Diverticulitis   . Dyslipidemia   . Essential hypertension   . H/O ventral hernia   . Non-obstructive CAD    a. 04/2015 NSTEMI/Cath: LAD 10p, LCX 32m, RCA 34m, 20d, EF 35-40 w/ apical ballooning.  . On home O2    3L N/C  . Osteoporosis   . Osteoporosis 11/04/2015   Managed by Dr. Legrand Rams   . Parkinson's disease ([place])   . Shortness of breath   . Takotsubo cardiomyopathy    a. 04/2015 Echo: EF 45-50%, mid-dist anterior/apical/inferoapical HK w/ hyperdynamic base. Gr 1 DD, mild AI, mild-mod MR, triv TR, PASP 9mmHg;  b. 04/2015 LV gram: Ef 35-40% w/ apical ballooning.  . Type II diabetes mellitus ([place])   . Ventricular bigeminy    a. 04/2015 in setting of NSTEMI/Takotsubo.    Patient Active Problem List   Diagnosis Date Noted  . Acute respiratory failure ([place]) 12/04/2017  . Community acquired pneumonia of left lung 07/19/2017  . Critical lower limb ischemia 07/07/2017  . Counseling regarding advanced care  planning and goals of care 05/15/2017  . Acute renal injury ([place]) 04/04/2017  . Chest pain 04/04/2017  . Nausea and vomiting 04/04/2017  . COPD with acute exacerbation ([place]) 04/25/2016  . Acute on chronic respiratory failure with hypoxia ([place]) 04/25/2016  . SOB (shortness of breath)   . Osteoporosis 11/04/2015  . Chronic diastolic CHF (congestive heart failure) ([place]) 08/01/2015  . CHF exacerbation ([place]) 07/09/2015  . Essential hypertension 04/19/2015  . Type II diabetes mellitus ([place]) 04/19/2015  . Dyslipidemia 04/19/2015  . History of lung cancer 04/19/2015  . Takotsubo cardiomyopathy 04/19/2015  . Ventricular bigeminy 04/19/2015  . NSTEMI (non-ST elevated myocardial infarction) ([place]) 04/19/2015  . Stress-induced cardiomyopathy 04/18/2015  . Elevated troponin 04/16/2015  . COPD exacerbation ([place]) 04/16/2015  . CKD (chronic kidney disease) stage 3, GFR 30-59 ml/min (HCC) 04/16/2015  . Herpes genitalis in women 11/24/2014  . Toe fracture 09/28/2014  . Non-traumatic tear of right rotator cuff 11/30/2013  . Pain in joint, shoulder region 11/30/2013  . Muscle weakness (generalized) 11/30/2013  . Bursitis of left shoulder 02/09/2013  . Adenocarcinoma of lung ([place]) 08/20/2012    Past Surgical History:  Procedure Laterality Date  . ABDOMINAL HYSTERECTOMY    . CARDIAC CATHETERIZATION N/A 04/17/2015   Procedure: Left Heart Cath and Coronary Angiography;  Surgeon: Troy Sine,  MD;  Location: [facility];  Service: Cardiovascular;  Laterality: N/A;  . CHOLECYSTECTOMY    . COLONOSCOPY N/A 09/18/2014   Procedure: COLONOSCOPY;  Surgeon: Danie Binder, MD;  Location: [facility]O SUITE;  Service: Endoscopy;  Laterality: N/A;  8:30 AM - moved to 10:30 Rosendo Gros to notify pt  . ECTOPIC PREGNANCY SURGERY    . INCISIONAL HERNIA REPAIR N/A 08/26/2013   Procedure: HERNIA REPAIR INCISIONAL WITH MESH;  Surgeon: Jamesetta So, MD;  Location: [facility] ORS;  Service: General;  Laterality: N/A;  . IR  FLUORO GUIDE PORT INSERTION RIGHT  04/30/2017  . IR US GUIDE VASC ACCESS RIGHT  04/30/2017  . LUNG CANCER SURGERY    . VIDEO BRONCHOSCOPY WITH ENDOBRONCHIAL NAVIGATION N/A 04/23/2017   Procedure: VIDEO BRONCHOSCOPY WITH ENDOBRONCHIAL NAVIGATION;  Surgeon: Melrose Nakayama, MD;  Location: [facility];  Service: Thoracic;  Laterality: N/A;  . VIDEO BRONCHOSCOPY WITH ENDOBRONCHIAL ULTRASOUND N/A 04/23/2017   Procedure: VIDEO BRONCHOSCOPY WITH ENDOBRONCHIAL ULTRASOUND;  Surgeon: Melrose Nakayama, MD;  Location: [facility] OR;  Service: Thoracic;  Laterality: N/A;    OB History    No data available       Home Medications    Prior to Admission medications   Medication Sig Start Date End Date Taking? Authorizing Provider  albuterol (PROAIR HFA) 108 (90 BASE) MCG/ACT inhaler Inhale 2 puffs into the lungs every 4 (four) hours as needed for wheezing or shortness of breath.   Yes [provider]  alendronate (FOSAMAX) 70 MG tablet Take 70 mg by mouth every Friday.    Yes [provider]  aspirin EC 81 MG tablet Take 81 mg by mouth daily.   Yes [provider]  Atezolizumab (TECENTRIQ IV) Inject into the vein. Every 3 weeks   Yes [provider]  atorvastatin (LIPITOR) 40 MG tablet Take 40 mg by mouth at bedtime.    Yes [provider]  buPROPion (WELLBUTRIN SR) 150 MG 12 hr tablet Take 150 mg by mouth 2 (two) times daily.    Yes [provider]  carbidopa-levodopa (SINEMET IR) 25-100 MG tablet Take 1 tablet by mouth 2 (two) times daily.   Yes [provider]  cholecalciferol (VITAMIN D) 1000 units tablet Take 1,000 Units by mouth daily.   Yes [provider]  clotrimazole-betamethasone (LOTRISONE) cream Apply 1 application topically 2 (two) times daily. 09/10/17  Yes Holley Bouche, NP  furosemide (LASIX) 40 MG tablet Take 1 tablet (40 mg total) by mouth daily. Pt is able to take one additional tablet daily for weight increases of  3lbs in a day or 5lbs in a week. Resume on 10/20 09/23/17  Yes Kathie Dike, MD  gabapentin (NEURONTIN) 300 MG capsule Take 600 mg by mouth at bedtime.    Yes [provider]  Garlic 147 MG TABS Take 300 mg by mouth daily.   Yes [provider]  guaiFENesin (MUCINEX) 600 MG 12 hr tablet Take 1 tablet (600 mg total) by mouth 2 (two) times daily. 09/23/17 09/23/18 Yes Memon, Jolaine Artist, MD  ipratropium-albuterol (DUONEB) 0.5-2.5 (3) MG/3ML SOLN Inhale 3 mLs into the lungs every 6 (six) hours as needed (for wheezing/shortness of breath).    Yes [provider]  KLOR-CON 10 10 MEQ tablet TAKE 1 TABLET (10 MEQ TOTAL) BY MOUTH DAILY. *FURTHER REFILLS NEED TO BE AUTHORIZED BY PCP* Patient taking differently: TAKE 1 TABLET (10 MEQ TOTAL) BY MOUTH DAILY. 08/11/17  Yes Lorretta Harp, MD  LANTUS SOLOSTAR 100 UNIT/ML Solostar Pen Inject 15 Units into the skin at bedtime.    Yes [provider]  lidocaine-prilocaine (EMLA) cream Apply to affected area once Patient taking differently: Apply 1 application topically daily as needed.  05/19/17  Yes Brunetta Genera, MD  linagliptin (TRADJENTA) 5 MG TABS tablet Take 5 mg by mouth daily.   Yes [provider]  loratadine (CLARITIN) 10 MG tablet Take 10 mg by mouth daily.   Yes [provider]  meclizine (ANTIVERT) 25 MG tablet Take 25 mg by mouth daily.    Yes [provider]  metoprolol succinate (TOPROL-XL) 25 MG 24 hr tablet Take 1 tablet (25 mg total) by mouth daily. 06/26/15  Yes Jettie Booze, MD  nitroGLYCERIN (NITROSTAT) 0.4 MG SL tablet Place 0.4 mg under the tongue every 5 (five) minutes as needed for chest pain.   Yes [provider]  Omega-3 Fatty Acids (FISH OIL PO) Take 1 capsule by mouth daily.   Yes [provider]  omeprazole (PRILOSEC) 40 MG capsule Take 40 mg by mouth daily.   Yes [provider]  ondansetron (ZOFRAN) 8 MG tablet Take 1 tablet (8 mg  total) by mouth 2 (two) times daily as needed (Nausea or vomiting). 05/19/17  Yes Brunetta Genera, MD  OXYGEN Inhale 3 L into the lungs continuous.   Yes [provider]  polyethylene glycol powder (GLYCOLAX/MIRALAX) powder Take 17 g by mouth daily as needed.   Yes [provider]  prochlorperazine (COMPAZINE) 10 MG tablet Take 1 tablet (10 mg total) by mouth every 6 (six) hours as needed (Nausea or vomiting). 05/19/17  Yes Brunetta Genera, MD  pyridoxine (B-6) 100 MG tablet Take 100 mg by mouth daily.    Yes [provider]  rOPINIRole (REQUIP) 0.5 MG tablet Take 0.5 mg by mouth 2 (two) times daily.    Yes [provider]  sodium chloride (OCEAN) 0.65 % SOLN nasal spray Place 1 spray into both nostrils every 3 (three) hours as needed for congestion.    Yes [provider]  traMADol (ULTRAM) 50 MG tablet Take 50 mg by mouth every 12 (twelve) hours as needed for moderate pain.    Yes [provider]  zolpidem (AMBIEN) 5 MG tablet Take 5 mg by mouth at bedtime as needed for sleep.   Yes [provider]  azithromycin (ZITHROMAX) 250 MG tablet TAKE 2 TABLETS BY MOUTH TODAY, THEN TAKE 1 TABLET DAILY FOR 4 DAYS 01/28/18   [provider]  B-D ULTRAFINE III SHORT PEN 31G X 8 MM MISC See admin instructions. 12/27/17   [provider]  benzonatate (TESSALON) 100 MG capsule Take 1 capsule (100 mg total) by mouth every 8 (eight) hours. 02/04/18   Tanna Furry, MD  doxycycline (VIBRAMYCIN) 100 MG capsule Take 1 capsule (100 mg total) by mouth 2 (two) times daily. 02/04/18   Tanna Furry, MD  predniSONE (DELTASONE) 20 MG tablet Take 1 tablet (20 mg total) by mouth 2 (two) times daily with a meal. 02/04/18   Tanna Furry, MD    Family History Family History  Problem Relation Age of Onset  . Diabetes Mother   . Hypertension Mother   . Diabetes Father   . Asthma Unknown   . Cancer Unknown   . Heart attack Sister        X2  .  Colon cancer Neg Hx     Social History Social History  Tobacco Use  . Smoking status: Former Smoker    Years: 20.00    Types: Cigarettes    Last attempt to quit: 08/13/2006    Years since quitting: 11.4  . Smokeless tobacco: Never Used  Substance Use Topics  . Alcohol use: No    Alcohol/week: 0.0 oz  . Drug use: No     Allergies   Lisinopril   Review of Systems Review of Systems  Constitutional: Negative for appetite change, chills, diaphoresis, fatigue and fever.  HENT: Negative for mouth sores, sore throat and trouble swallowing.   Eyes: Negative for visual disturbance.  Respiratory: Positive for cough and shortness of breath. Negative for chest tightness and wheezing.   Cardiovascular: Negative for chest pain.  Gastrointestinal: Negative for abdominal distention, abdominal pain, diarrhea, nausea and vomiting.  Endocrine: Negative for polydipsia, polyphagia and polyuria.  Genitourinary: Negative for dysuria, frequency and hematuria.  Musculoskeletal: Negative for gait problem.  Skin: Negative for color change, pallor and rash.  Neurological: Negative for dizziness, syncope, light-headedness and headaches.  Hematological: Does not bruise/bleed easily.  Psychiatric/Behavioral: Negative for behavioral problems and confusion.     Physical Exam Updated Vital Signs BP (!) 148/61 (BP Location: Right Arm)   Pulse 96   Temp 97.9 F (36.6 C) (Oral)   Resp 16   Ht 5\' 4"  (1.626 m)   Wt 77.1 kg (170 lb)   SpO2 100%   BMI 29.18 kg/m   Physical Exam  Constitutional: She is oriented to person, place, and time. She appears well-developed and well-nourished. No distress.  HENT:  Head: Normocephalic.  Eyes: Conjunctivae are normal. Pupils are equal, round, and reactive to light. No scleral icterus.  Neck: Normal range of motion. Neck supple. No thyromegaly present.  Cardiovascular: Normal rate and regular rhythm. Exam reveals no gallop and no friction rub.  No murmur  heard. Pulmonary/Chest: No respiratory distress. She has no wheezes. She has no rales.  Saturating 97% on her 3 L nasal cannula.  She has marked prolongation and bronchospasm.  No crackles or rales.  No JVD.  No gallop.  No peripheral edema.  Abdominal: Soft. Bowel sounds are normal. She exhibits no distension. There is no tenderness. There is no rebound.  Musculoskeletal: Normal range of motion.  Neurological: She is alert and oriented to person, place, and time.  Skin: Skin is warm and dry. No rash noted.  Psychiatric: She has a normal mood and affect. Her behavior is normal.     ED Treatments / Results  Labs (all labs ordered are listed, but only abnormal results are displayed) Labs Reviewed  TROPONIN I - Abnormal; Notable for the following components:      Result Value   Troponin I 0.06 (*)    All other components within normal limits  BRAIN NATRIURETIC PEPTIDE    EKG  EKG Interpretation  Date/Time:  Thursday February 04 2018 16:03:41 EST Ventricular Rate:  98 PR Interval:    QRS Duration: 104 QT Interval:  370 QTC Calculation: 473 R Axis:   58 Text Interpretation:  Sinus rhythm Anteroseptal infarct, age indeterminate Confirmed by Tanna Furry [id number]) on 02/04/2018 5:07:54 PM       Radiology Dg Chest 2 View  Result Date: 02/04/2018 CLINICAL DATA:  Stage IV lung cancer.  Wheezing. EXAM: CHEST  2 VIEW COMPARISON:  Radiographs 12/03/2017 and prior FINDINGS: Cardiomediastinal silhouette is unchanged. Continued LEFT LOWER lung opacity again noted and unchanged. A RIGHT IJ Port-A-Cath is present with tip overlying the LOWER  SVC. There is no evidence of new pulmonary opacity, pleural effusion or pneumothorax. IMPRESSION: Cardiomegaly without evidence of acute cardiopulmonary disease. Electronically Signed   By: Margarette Canada M.D.   On: 02/04/2018 14:32    Procedures Procedures (including critical care time)  Medications Ordered in ED Medications  albuterol  (PROVENTIL,VENTOLIN) solution continuous neb (10 mg/hr Nebulization Given 02/04/18 1642)  ipratropium (ATROVENT) nebulizer solution 0.5 mg (0.5 mg Nebulization Given 02/04/18 1642)     Initial Impression / Assessment and Plan / ED Course  I have reviewed the triage vital signs and the nursing notes.  Pertinent labs & imaging results that were available during my care of the patient were reviewed by me and considered in my medical decision making (see chart for details).     Protein 0.06.  X-ray with no acute findings.  ENT is 34.  Chest x-ray does not show infiltrate or effusion.  Given nebulized albuterol and IV Solu-Medrol.  Following the above on reexam she states she feels "much much better.  Continues to speak in full sentences after treatment.  Saturating 98%.  Desirous of being discharged home.  I think this is appropriate.  I am not suspicious this is pulmonary embolus.  After treatment with albuterol her heart rate is 90.  Her wheezing has improved her symptoms have resolved.  Plan is home, doxycycline, prednisone, Tessalon, frequent nebs.  Primary care follow-up.  ER with any worsening.  Final Clinical Impressions(s) / ED Diagnoses   Final diagnoses:  COPD exacerbation [facility])    ED Discharge Orders        Ordered    predniSONE (DELTASONE) 20 MG tablet  2 times daily with meals     02/04/18 1825    benzonatate (TESSALON) 100 MG capsule  Every 8 hours     02/04/18 1825    doxycycline (VIBRAMYCIN) 100 MG capsule  2 times daily     02/04/18 1825       Tanna Furry, MD 02/04/18 [phone]

## 2018-02-04 NOTE — ED Notes (Signed)
RT notified of orders 

## 2018-02-04 NOTE — Patient Instructions (Addendum)
[place] at [facility] Discharge Instructions  RECOMMENDATIONS MADE BY THE CONSULTANT AND ANY TEST RESULTS WILL BE SENT TO YOUR REFERRING PHYSICIAN.  You were seen today by Dr. Zoila Shutter We will get you scheduled for your scans in April You will get treatment today and continue getting it every 21 days We will get you a chest xray today to see why you are wheezing We will give you some steroids today during treatment  Please see Dr. Luan Pulling next week to see what he wants to do about wheezing. Follow up with the physician in 3 weeks  Thank you for choosing [place] at [facility] to provide your oncology and hematology care.  To afford each patient quality time with our provider, please arrive at least 15 minutes before your scheduled appointment time.    If you have a lab appointment with the [facility] please come in thru the  Main Entrance and check in at the main information desk  You need to re-schedule your appointment should you arrive 10 or more minutes late.  We strive to give you quality time with our providers, and arriving late affects you and other patients whose appointments are after yours.  Also, if you no show three or more times for appointments you may be dismissed from the clinic at the providers discretion.     Again, thank you for choosing [facility].  Our hope is that these requests will decrease the amount of time that you wait before being seen by our physicians.       _____________________________________________________________  Should you have questions after your visit to [facility], please contact our office at [phone] between the hours of 8:30 a.m. and 4:30 p.m.  Voicemails left after 4:30 p.m. will not be returned until the following business day.  For prescription refill requests, have your pharmacy contact our office.       Resources For Cancer Patients and their  Caregivers ? American Cancer Society: Can assist with transportation, wigs, general needs, runs Look Good Feel Better.        878 560 0839 ? Cancer Care: Provides financial assistance, online support groups, medication/co-pay assistance.  1-800-813-HOPE 3403143815) ? [place] Assists [place] cancer patients and their families through emotional , educational and financial support.  (414) 879-1664 ? [place] DSS Where to apply for food stamps, Medicaid and utility assistance. (316)481-9340 ? [facility]: Transportation to medical appointments. 858-325-3731 ? Social Security Administration: May apply for disability if have a Stage IV cancer. 865 025 8400 (902) 134-3356 ? [employer], Disability and Transit Services: Assists with nutrition, care and transit needs. [place] Support Programs: @10RELATIVEDAYS @ > Cancer Support Group  2nd Tuesday of the month 1pm-2pm, Journey Room  > Creative Journey  3rd Tuesday of the month 1130am-1pm, Journey Room  > Look Good Feel Better  1st Wednesday of the month 10am-12 noon, Journey Room (Call [facility] to register [phone])

## 2018-02-04 NOTE — ED Triage Notes (Signed)
Pt has been having shortness of breath x2 days. Pt was at the cancer center today receiving chemotherapy for lung cancer and shortness of breath became worse.

## 2018-02-05 ENCOUNTER — Emergency Department (HOSPITAL_COMMUNITY): Payer: Medicare Other

## 2018-02-05 ENCOUNTER — Other Ambulatory Visit: Payer: Self-pay

## 2018-02-05 ENCOUNTER — Encounter (HOSPITAL_COMMUNITY): Payer: Self-pay | Admitting: *Deleted

## 2018-02-05 ENCOUNTER — Inpatient Hospital Stay (HOSPITAL_COMMUNITY)
Admission: EM | Admit: 2018-02-05 | Discharge: 2018-02-08 | DRG: 190 | Disposition: A | Discharge status: Home Health | Payer: Medicare Other | Attending: Internal Medicine | Admitting: Internal Medicine

## 2018-02-05 DIAGNOSIS — I130 Hypertensive heart and chronic kidney disease with heart failure and stage 1 through stage 4 chronic kidney disease, or unspecified chronic kidney disease: Secondary | ICD-10-CM | POA: Diagnosis present

## 2018-02-05 DIAGNOSIS — I2510 Atherosclerotic heart disease of native coronary artery without angina pectoris: Secondary | ICD-10-CM | POA: Diagnosis present

## 2018-02-05 DIAGNOSIS — Z87891 Personal history of nicotine dependence: Secondary | ICD-10-CM

## 2018-02-05 DIAGNOSIS — I5032 Chronic diastolic (congestive) heart failure: Secondary | ICD-10-CM | POA: Diagnosis present

## 2018-02-05 DIAGNOSIS — N183 Chronic kidney disease, stage 3 unspecified: Secondary | ICD-10-CM | POA: Diagnosis present

## 2018-02-05 DIAGNOSIS — I5033 Acute on chronic diastolic (congestive) heart failure: Secondary | ICD-10-CM | POA: Diagnosis present

## 2018-02-05 DIAGNOSIS — E119 Type 2 diabetes mellitus without complications: Secondary | ICD-10-CM

## 2018-02-05 DIAGNOSIS — E1122 Type 2 diabetes mellitus with diabetic chronic kidney disease: Secondary | ICD-10-CM | POA: Diagnosis present

## 2018-02-05 DIAGNOSIS — Z79899 Other long term (current) drug therapy: Secondary | ICD-10-CM

## 2018-02-05 DIAGNOSIS — M810 Age-related osteoporosis without current pathological fracture: Secondary | ICD-10-CM | POA: Diagnosis present

## 2018-02-05 DIAGNOSIS — G20 Parkinson's disease: Secondary | ICD-10-CM | POA: Diagnosis present

## 2018-02-05 DIAGNOSIS — Z794 Long term (current) use of insulin: Secondary | ICD-10-CM

## 2018-02-05 DIAGNOSIS — I509 Heart failure, unspecified: Secondary | ICD-10-CM

## 2018-02-05 DIAGNOSIS — Z9221 Personal history of antineoplastic chemotherapy: Secondary | ICD-10-CM

## 2018-02-05 DIAGNOSIS — Z7982 Long term (current) use of aspirin: Secondary | ICD-10-CM

## 2018-02-05 DIAGNOSIS — I10 Essential (primary) hypertension: Secondary | ICD-10-CM | POA: Diagnosis present

## 2018-02-05 DIAGNOSIS — C3490 Malignant neoplasm of unspecified part of unspecified bronchus or lung: Secondary | ICD-10-CM | POA: Diagnosis present

## 2018-02-05 DIAGNOSIS — Z9981 Dependence on supplemental oxygen: Secondary | ICD-10-CM

## 2018-02-05 DIAGNOSIS — I252 Old myocardial infarction: Secondary | ICD-10-CM

## 2018-02-05 DIAGNOSIS — J9690 Respiratory failure, unspecified, unspecified whether with hypoxia or hypercapnia: Secondary | ICD-10-CM | POA: Diagnosis present

## 2018-02-05 DIAGNOSIS — Z66 Do not resuscitate: Secondary | ICD-10-CM | POA: Diagnosis present

## 2018-02-05 DIAGNOSIS — E785 Hyperlipidemia, unspecified: Secondary | ICD-10-CM | POA: Diagnosis present

## 2018-02-05 DIAGNOSIS — J441 Chronic obstructive pulmonary disease with (acute) exacerbation: Principal | ICD-10-CM | POA: Diagnosis present

## 2018-02-05 DIAGNOSIS — Z888 Allergy status to other drugs, medicaments and biological substances status: Secondary | ICD-10-CM

## 2018-02-05 DIAGNOSIS — Z85118 Personal history of other malignant neoplasm of bronchus and lung: Secondary | ICD-10-CM

## 2018-02-05 DIAGNOSIS — J9621 Acute and chronic respiratory failure with hypoxia: Secondary | ICD-10-CM | POA: Diagnosis present

## 2018-02-05 DIAGNOSIS — R748 Abnormal levels of other serum enzymes: Secondary | ICD-10-CM | POA: Diagnosis present

## 2018-02-05 LAB — CBC WITH DIFFERENTIAL/PLATELET
Basophils Absolute: 0 10*3/uL (ref 0.0–0.1)
Basophils Relative: 0 %
Eosinophils Absolute: 0 10*3/uL (ref 0.0–0.7)
Eosinophils Relative: 0 %
HCT: 37.9 % (ref 36.0–46.0)
Hemoglobin: 11.5 g/dL — ABNORMAL LOW (ref 12.0–15.0)
Lymphocytes Relative: 5 %
Lymphs Abs: 0.8 10*3/uL (ref 0.7–4.0)
MCH: 28.3 pg (ref 26.0–34.0)
MCHC: 30.3 g/dL (ref 30.0–36.0)
MCV: 93.3 fL (ref 78.0–100.0)
Monocytes Absolute: 1.2 10*3/uL — ABNORMAL HIGH (ref 0.1–1.0)
Monocytes Relative: 8 %
Neutro Abs: 13.7 10*3/uL — ABNORMAL HIGH (ref 1.7–7.7)
Neutrophils Relative %: 87 %
Platelets: 258 10*3/uL (ref 150–400)
RBC: 4.06 MIL/uL (ref 3.87–5.11)
RDW: 16.3 % — ABNORMAL HIGH (ref 11.5–15.5)
WBC: 15.7 10*3/uL — ABNORMAL HIGH (ref 4.0–10.5)

## 2018-02-05 LAB — COMPREHENSIVE METABOLIC PANEL
ALT: 25 U/L (ref 14–54)
AST: 30 U/L (ref 15–41)
Albumin: 4 g/dL (ref 3.5–5.0)
Alkaline Phosphatase: 50 U/L (ref 38–126)
Anion gap: 14 (ref 5–15)
BUN: 32 mg/dL — ABNORMAL HIGH (ref 6–20)
CO2: 23 mmol/L (ref 22–32)
Calcium: 10.8 mg/dL — ABNORMAL HIGH (ref 8.9–10.3)
Chloride: 101 mmol/L (ref 101–111)
Creatinine, Ser: 1.48 mg/dL — ABNORMAL HIGH (ref 0.44–1.00)
GFR calc Af Amer: 37 mL/min — ABNORMAL LOW (ref >60)
GFR calc non Af Amer: 32 mL/min — ABNORMAL LOW (ref >60)
Glucose, Bld: 147 mg/dL — ABNORMAL HIGH (ref 65–99)
Potassium: 5 mmol/L (ref 3.5–5.1)
Sodium: 138 mmol/L (ref 135–145)
Total Bilirubin: 0.5 mg/dL (ref 0.3–1.2)
Total Protein: 7.4 g/dL (ref 6.5–8.1)

## 2018-02-05 LAB — INFLUENZA PANEL BY PCR (TYPE A & B)
Influenza A By PCR: NEGATIVE
Influenza B By PCR: NEGATIVE

## 2018-02-05 LAB — BRAIN NATRIURETIC PEPTIDE: B Natriuretic Peptide: 230 pg/mL — ABNORMAL HIGH (ref 0.0–100.0)

## 2018-02-05 MED ORDER — ALBUTEROL SULFATE (2.5 MG/3ML) 0.083% IN NEBU
5.0000 mg | INHALATION_SOLUTION | Freq: Once | RESPIRATORY_TRACT | Status: AC
  Administered 2018-02-05: 5 mg via RESPIRATORY_TRACT
  Filled 2018-02-05: qty 6

## 2018-02-05 MED ORDER — ALBUTEROL SULFATE (2.5 MG/3ML) 0.083% IN NEBU: 5.0000 mg | INHALATION_SOLUTION | Freq: Once | RESPIRATORY_TRACT | Status: DC

## 2018-02-05 MED ORDER — PREDNISONE 50 MG PO TABS
60.0000 mg | ORAL_TABLET | Freq: Once | ORAL | Status: AC
  Administered 2018-02-05: 60 mg via ORAL
  Filled 2018-02-05: qty 1

## 2018-02-05 NOTE — ED Triage Notes (Signed)
Pt arrived by EMS from home C/O SOB. Pt was seen in the ER yesterday & got to go home. Pt says became more SOB tonight, pt took breathing tx just before coming to the ER.

## 2018-02-05 NOTE — ED Provider Notes (Addendum)
[facility] Provider Note   CSN: [id number] Arrival date & time: 02/05/18  2104     History   Chief Complaint Chief Complaint  Patient presents with  . Shortness of Breath    HPI Angela [facility] is a 84 y.o. female.  HPI  84 y.o. Female with hisotry of copd and lung cancer presents with increasing dyspnea on home oxygen.  Patient states increased dyspnea 3 days.  Some cough increased 3 days ago with no sputum production.  NO fever or uri symptoms.  Denies other symptoms except wheezing.  Using nebs 3 times per day which is baseline.  Seen yestrday and started docycycline and prednisone and worsened today. Denies edema or chest pain.  Increased cough with laying flat.  On oxygen at 3 l/m.  Past Medical History:  Diagnosis Date  . Adenocarcinoma of lung ([place])    Left lung 2009, resected  . Anginal pain ([place])   . Arthritis   . Back pain   . CHF (congestive heart failure) ([place])   . COPD (chronic obstructive pulmonary disease) ([place])   . Diverticulitis   . Dyslipidemia   . Essential hypertension   . H/O ventral hernia   . Non-obstructive CAD    a. 04/2015 NSTEMI/Cath: LAD 10p, LCX 38m, RCA 97m, 20d, EF 35-40 w/ apical ballooning.  . On home O2    3L N/C  . Osteoporosis   . Osteoporosis 11/04/2015   Managed by Dr. Legrand Rams   . Parkinson's disease ([place])   . Shortness of breath   . Takotsubo cardiomyopathy    a. 04/2015 Echo: EF 45-50%, mid-dist anterior/apical/inferoapical HK w/ hyperdynamic base. Gr 1 DD, mild AI, mild-mod MR, triv TR, PASP 70mmHg;  b. 04/2015 LV gram: Ef 35-40% w/ apical ballooning.  . Type II diabetes mellitus ([place])   . Ventricular bigeminy    a. 04/2015 in setting of NSTEMI/Takotsubo.    Patient Active Problem List   Diagnosis Date Noted  . Acute respiratory failure ([place]) 12/04/2017  . Community acquired pneumonia of left lung 07/19/2017  . Critical lower limb ischemia 07/07/2017  . Counseling regarding advanced care planning and goals  of care 05/15/2017  . Acute renal injury ([place]) 04/04/2017  . Chest pain 04/04/2017  . Nausea and vomiting 04/04/2017  . COPD with acute exacerbation ([place]) 04/25/2016  . Acute on chronic respiratory failure with hypoxia ([place]) 04/25/2016  . SOB (shortness of breath)   . Osteoporosis 11/04/2015  . Chronic diastolic CHF (congestive heart failure) ([place]) 08/01/2015  . CHF exacerbation ([place]) 07/09/2015  . Essential hypertension 04/19/2015  . Type II diabetes mellitus ([place]) 04/19/2015  . Dyslipidemia 04/19/2015  . History of lung cancer 04/19/2015  . Takotsubo cardiomyopathy 04/19/2015  . Ventricular bigeminy 04/19/2015  . NSTEMI (non-ST elevated myocardial infarction) ([place]) 04/19/2015  . Stress-induced cardiomyopathy 04/18/2015  . Elevated troponin 04/16/2015  . COPD exacerbation ([place]) 04/16/2015  . CKD (chronic kidney disease) stage 3, GFR 30-59 ml/min ([facility]) 04/16/2015  . Herpes genitalis in women 11/24/2014  . Toe fracture 09/28/2014  . Non-traumatic tear of right rotator cuff 11/30/2013  . Pain in joint, shoulder region 11/30/2013  . Muscle weakness (generalized) 11/30/2013  . Bursitis of left shoulder 02/09/2013  . Adenocarcinoma of lung ([place]) 08/20/2012    Past Surgical History:  Procedure Laterality Date  . ABDOMINAL HYSTERECTOMY    . CARDIAC CATHETERIZATION N/A 04/17/2015   Procedure: Left Heart Cath and Coronary Angiography;  Surgeon: Troy Sine, MD;  Location: [facility]  INVASIVE CV LAB;  Service: Cardiovascular;  Laterality: N/A;  . CHOLECYSTECTOMY    . COLONOSCOPY N/A 09/18/2014   Procedure: COLONOSCOPY;  Surgeon: Danie Binder, MD;  Location: [facility] ENDO SUITE;  Service: Endoscopy;  Laterality: N/A;  8:30 AM - moved to 10:30 Rosendo Gros to notify pt  . ECTOPIC PREGNANCY SURGERY    . INCISIONAL HERNIA REPAIR N/A 08/26/2013   Procedure: HERNIA REPAIR INCISIONAL WITH MESH;  Surgeon: Jamesetta So, MD;  Location: [facility] ORS;  Service: General;  Laterality: N/A;  . IR FLUORO GUIDE PORT  INSERTION RIGHT  04/30/2017  . IR US GUIDE VASC ACCESS RIGHT  04/30/2017  . LUNG CANCER SURGERY    . VIDEO BRONCHOSCOPY WITH ENDOBRONCHIAL NAVIGATION N/A 04/23/2017   Procedure: VIDEO BRONCHOSCOPY WITH ENDOBRONCHIAL NAVIGATION;  Surgeon: Melrose Nakayama, MD;  Location: [facility];  Service: Thoracic;  Laterality: N/A;  . VIDEO BRONCHOSCOPY WITH ENDOBRONCHIAL ULTRASOUND N/A 04/23/2017   Procedure: VIDEO BRONCHOSCOPY WITH ENDOBRONCHIAL ULTRASOUND;  Surgeon: Melrose Nakayama, MD;  Location: [facility] OR;  Service: Thoracic;  Laterality: N/A;    OB History    No data available       Home Medications    Prior to Admission medications   Medication Sig Start Date End Date Taking? Authorizing Provider  albuterol (PROAIR HFA) 108 (90 BASE) MCG/ACT inhaler Inhale 2 puffs into the lungs every 4 (four) hours as needed for wheezing or shortness of breath.    [provider]  alendronate (FOSAMAX) 70 MG tablet Take 70 mg by mouth every Friday.     [provider]  aspirin EC 81 MG tablet Take 81 mg by mouth daily.    [provider]  Atezolizumab (TECENTRIQ IV) Inject into the vein. Every 3 weeks    [provider]  atorvastatin (LIPITOR) 40 MG tablet Take 40 mg by mouth at bedtime.     [provider]  azithromycin (ZITHROMAX) 250 MG tablet TAKE 2 TABLETS BY MOUTH TODAY, THEN TAKE 1 TABLET DAILY FOR 4 DAYS 01/28/18   [provider]  B-D ULTRAFINE III SHORT PEN 31G X 8 MM MISC See admin instructions. 12/27/17   [provider]  benzonatate (TESSALON) 100 MG capsule Take 1 capsule (100 mg total) by mouth every 8 (eight) hours. 02/04/18   Tanna Furry, MD  buPROPion [facility] SR) 150 MG 12 hr tablet Take 150 mg by mouth 2 (two) times daily.     [provider]  carbidopa-levodopa (SINEMET IR) 25-100 MG tablet Take 1 tablet by mouth 2 (two) times daily.    [provider]  cholecalciferol (VITAMIN D) 1000 units tablet Take 1,000  Units by mouth daily.    [provider]  clotrimazole-betamethasone (LOTRISONE) cream Apply 1 application topically 2 (two) times daily. 09/10/17   Holley Bouche, NP  doxycycline (VIBRAMYCIN) 100 MG capsule Take 1 capsule (100 mg total) by mouth 2 (two) times daily. 02/04/18   Tanna Furry, MD  furosemide (LASIX) 40 MG tablet Take 1 tablet (40 mg total) by mouth daily. Pt is able to take one additional tablet daily for weight increases of 3lbs in a day or 5lbs in a week. Resume on 10/20 09/23/17   Kathie Dike, MD  gabapentin (NEURONTIN) 300 MG capsule Take 600 mg by mouth at bedtime.     [provider]  Garlic 034 MG TABS Take 300 mg by mouth daily.    [provider]  guaiFENesin (MUCINEX) 600 MG 12 hr tablet  Take 1 tablet (600 mg total) by mouth 2 (two) times daily. 09/23/17 09/23/18  Kathie Dike, MD  ipratropium-albuterol (DUONEB) 0.5-2.5 (3) MG/3ML SOLN Inhale 3 mLs into the lungs every 6 (six) hours as needed (for wheezing/shortness of breath).     [provider]  KLOR-CON 10 10 MEQ tablet TAKE 1 TABLET (10 MEQ TOTAL) BY MOUTH DAILY. *FURTHER REFILLS NEED TO BE AUTHORIZED BY PCP* Patient taking differently: TAKE 1 TABLET (10 MEQ TOTAL) BY MOUTH DAILY. 08/11/17   Lorretta Harp, MD  LANTUS SOLOSTAR 100 UNIT/ML Solostar Pen Inject 15 Units into the skin at bedtime.     [provider]  lidocaine-prilocaine (EMLA) cream Apply to affected area once Patient taking differently: Apply 1 application topically daily as needed.  05/19/17   Brunetta Genera, MD  linagliptin (TRADJENTA) 5 MG TABS tablet Take 5 mg by mouth daily.    [provider]  loratadine (CLARITIN) 10 MG tablet Take 10 mg by mouth daily.    [provider]  meclizine (ANTIVERT) 25 MG tablet Take 25 mg by mouth daily.     [provider]  metoprolol succinate (TOPROL-XL) 25 MG 24 hr tablet Take 1 tablet (25 mg total) by mouth daily. 06/26/15    Jettie Booze, MD  nitroGLYCERIN (NITROSTAT) 0.4 MG SL tablet Place 0.4 mg under the tongue every 5 (five) minutes as needed for chest pain.    [provider]  Omega-3 Fatty Acids (FISH OIL PO) Take 1 capsule by mouth daily.    [provider]  omeprazole (PRILOSEC) 40 MG capsule Take 40 mg by mouth daily.    [provider]  ondansetron (ZOFRAN) 8 MG tablet Take 1 tablet (8 mg total) by mouth 2 (two) times daily as needed (Nausea or vomiting). 05/19/17   Brunetta Genera, MD  OXYGEN Inhale 3 L into the lungs continuous.    [provider]  polyethylene glycol powder (GLYCOLAX/MIRALAX) powder Take 17 g by mouth daily as needed.    [provider]  predniSONE (DELTASONE) 20 MG tablet Take 1 tablet (20 mg total) by mouth 2 (two) times daily with a meal. 02/04/18   Tanna Furry, MD  prochlorperazine (COMPAZINE) 10 MG tablet Take 1 tablet (10 mg total) by mouth every 6 (six) hours as needed (Nausea or vomiting). 05/19/17   Brunetta Genera, MD  pyridoxine (B-6) 100 MG tablet Take 100 mg by mouth daily.     [provider]  rOPINIRole (REQUIP) 0.5 MG tablet Take 0.5 mg by mouth 2 (two) times daily.     [provider]  sodium chloride (OCEAN) 0.65 % SOLN nasal spray Place 1 spray into both nostrils every 3 (three) hours as needed for congestion.     [provider]  traMADol (ULTRAM) 50 MG tablet Take 50 mg by mouth every 12 (twelve) hours as needed for moderate pain.     [provider]  zolpidem (AMBIEN) 5 MG tablet Take 5 mg by mouth at bedtime as needed for sleep.    [provider]    Family History Family History  Problem Relation Age of Onset  . Diabetes Mother   . Hypertension Mother   . Diabetes Father   . Asthma Unknown   . Cancer Unknown   . Heart attack Sister        X2  . Colon cancer Neg Hx     Social History Social History   Tobacco Use  .  Smoking status: Former Smoker     Years: 20.00    Types: Cigarettes    Last attempt to quit: 08/13/2006    Years since quitting: 11.4  . Smokeless tobacco: Never Used  Substance Use Topics  . Alcohol use: No    Alcohol/week: 0.0 oz  . Drug use: No     Allergies   Lisinopril   Review of Systems Review of Systems  Constitutional: Negative.   HENT: Negative.   Eyes: Negative.   Respiratory: Positive for shortness of breath.   Cardiovascular: Positive for leg swelling.  Gastrointestinal: Negative.   Endocrine: Negative.   Musculoskeletal: Negative.   Neurological: Negative.   Psychiatric/Behavioral: Negative.   All other systems reviewed and are negative.    Physical Exam Updated Vital Signs BP (!) 131/43   Pulse 94   Temp 98.5 F (36.9 C) (Oral)   Resp 16   Ht 1.626 m (5\' 4" )   Wt 77.1 kg (170 lb)   SpO2 97%   BMI 29.18 kg/m   Physical Exam  Constitutional: She is oriented to person, place, and time. She appears well-developed and well-nourished. She appears ill.  HENT:  Head: Normocephalic.  Eyes: Pupils are equal, round, and reactive to light.  Neck: Normal range of motion.  Pulmonary/Chest: Tachypnea noted. She has wheezes in the right middle field, the right lower field, the left middle field and the left lower field.  Increased work of breathing  Abdominal: Soft.  Musculoskeletal:       Right lower leg: She exhibits edema.       Left lower leg: She exhibits edema.  Neurological: She is alert and oriented to person, place, and time.  Skin: Skin is warm and dry. Capillary refill takes less than 2 seconds.  Psychiatric: She has a normal mood and affect.  Nursing note and vitals reviewed.    ED Treatments / Results  Labs (all labs ordered are listed, but only abnormal results are displayed) Labs Reviewed - No data to display  EKG  EKG Interpretation  Date/Time:  Friday February 05 2018 21:19:53 EST Ventricular Rate:  95 PR Interval:    QRS Duration: 83 QT Interval:  352 QTC  Calculation: 443 R Axis:   35 Text Interpretation:  Sinus rhythm Anteroseptal infarct, age indeterminate Minimal ST elevation, inferior leads No acute changes Nonspecific ST and T wave abnormality Confirmed by Varney Biles [phone]) on 02/07/2018 7:59:13 PM       Radiology Dg Chest 2 View  Result Date: 02/04/2018 CLINICAL DATA:  Stage IV lung cancer.  Wheezing. EXAM: CHEST  2 VIEW COMPARISON:  Radiographs 12/03/2017 and prior FINDINGS: Cardiomediastinal silhouette is unchanged. Continued LEFT LOWER lung opacity again noted and unchanged. A RIGHT IJ Port-A-Cath is present with tip overlying the LOWER SVC. There is no evidence of new pulmonary opacity, pleural effusion or pneumothorax. IMPRESSION: Cardiomegaly without evidence of acute cardiopulmonary disease. Electronically Signed   By: Margarette Canada M.D.   On: 02/04/2018 14:32   Dg Chest Portable 1 View  Result Date: 02/05/2018 CLINICAL DATA:  Increasing shortness of breath. EXAM: PORTABLE CHEST 1 VIEW COMPARISON:  Frontal and lateral views yesterday. Most recent chest CT 10/08/2017 FINDINGS: Tip of the right chest port remains in place in the mid SVC. Unchanged volume loss in the left hemithorax with basilar pleuroparenchymal scarring. Unchanged heart size and mediastinal contours. Mild bronchial thickening and central vascular prominence. No confluent consolidation. No pneumothorax or large pleural effusion. Chronic change of both shoulders. IMPRESSION:  1. Mild vascular congestion and bronchial thickening. 2. Stable volume loss and scarring in the left hemithorax. Electronically Signed   By: Jeb Levering M.D.   On: 02/05/2018 21:39    Procedures Procedures (including critical care time)  Medications Ordered in ED Medications  albuterol (PROVENTIL) (2.5 MG/3ML) 0.083% nebulizer solution 5 mg (not administered)     Initial Impression / Assessment and Plan / ED Course  I have reviewed the triage vital signs and the nursing  notes.  Pertinent labs & imaging results that were available during my care of the patient were reviewed by me and considered in my medical decision making (see chart for details).    12:14 AM Patient with continued wheezing and desaturations with any exertion.  Flu negative no definite infiltrates noted on chest x-Denys Salinger.  Patient has received repeat dose of prednisone here.  She does have a leukocytosis but also received prednisone yesterday.  Patient does have some increased markings chest x-Kellin Fifer and has not elevated BNP here.  However, blood pressure is 114/70.  Plan 20 mg of Lasix IV. Plan consult to hospitalist for patient with COPD, lung cancer, and CHF.  I suspect that this decompensation is multifactorial Discussed with Dr. Manuella Ghazi and he will see for admission Final Clinical Impressions(s) / ED Diagnoses   Final diagnoses:  COPD exacerbation ([place])  Acute on chronic respiratory failure with hypoxia ([place])  Acute on chronic congestive heart failure, unspecified heart failure type [facility])    ED Discharge Orders    None       Pattricia Boss, MD 02/06/18 [id number]    Pattricia Boss, MD 03/04/18 [phone]

## 2018-02-06 ENCOUNTER — Inpatient Hospital Stay (HOSPITAL_COMMUNITY): Payer: Medicare Other

## 2018-02-06 ENCOUNTER — Other Ambulatory Visit: Payer: Self-pay

## 2018-02-06 DIAGNOSIS — I10 Essential (primary) hypertension: Secondary | ICD-10-CM | POA: Diagnosis not present

## 2018-02-06 DIAGNOSIS — Z9981 Dependence on supplemental oxygen: Secondary | ICD-10-CM | POA: Diagnosis not present

## 2018-02-06 DIAGNOSIS — E785 Hyperlipidemia, unspecified: Secondary | ICD-10-CM | POA: Diagnosis present

## 2018-02-06 DIAGNOSIS — Z79899 Other long term (current) drug therapy: Secondary | ICD-10-CM | POA: Diagnosis not present

## 2018-02-06 DIAGNOSIS — J9621 Acute and chronic respiratory failure with hypoxia: Secondary | ICD-10-CM

## 2018-02-06 DIAGNOSIS — C3490 Malignant neoplasm of unspecified part of unspecified bronchus or lung: Secondary | ICD-10-CM | POA: Diagnosis not present

## 2018-02-06 DIAGNOSIS — E1122 Type 2 diabetes mellitus with diabetic chronic kidney disease: Secondary | ICD-10-CM | POA: Diagnosis present

## 2018-02-06 DIAGNOSIS — Z87891 Personal history of nicotine dependence: Secondary | ICD-10-CM | POA: Diagnosis not present

## 2018-02-06 DIAGNOSIS — N183 Chronic kidney disease, stage 3 (moderate): Secondary | ICD-10-CM | POA: Diagnosis present

## 2018-02-06 DIAGNOSIS — I252 Old myocardial infarction: Secondary | ICD-10-CM | POA: Diagnosis not present

## 2018-02-06 DIAGNOSIS — I110 Hypertensive heart disease with heart failure: Secondary | ICD-10-CM

## 2018-02-06 DIAGNOSIS — R748 Abnormal levels of other serum enzymes: Secondary | ICD-10-CM | POA: Diagnosis present

## 2018-02-06 DIAGNOSIS — Z888 Allergy status to other drugs, medicaments and biological substances status: Secondary | ICD-10-CM | POA: Diagnosis not present

## 2018-02-06 DIAGNOSIS — G20 Parkinson's disease: Secondary | ICD-10-CM | POA: Diagnosis present

## 2018-02-06 DIAGNOSIS — I5033 Acute on chronic diastolic (congestive) heart failure: Secondary | ICD-10-CM | POA: Diagnosis present

## 2018-02-06 DIAGNOSIS — Z794 Long term (current) use of insulin: Secondary | ICD-10-CM | POA: Diagnosis not present

## 2018-02-06 DIAGNOSIS — Z85118 Personal history of other malignant neoplasm of bronchus and lung: Secondary | ICD-10-CM | POA: Diagnosis not present

## 2018-02-06 DIAGNOSIS — I130 Hypertensive heart and chronic kidney disease with heart failure and stage 1 through stage 4 chronic kidney disease, or unspecified chronic kidney disease: Secondary | ICD-10-CM | POA: Diagnosis present

## 2018-02-06 DIAGNOSIS — I509 Heart failure, unspecified: Secondary | ICD-10-CM | POA: Diagnosis not present

## 2018-02-06 DIAGNOSIS — Z7982 Long term (current) use of aspirin: Secondary | ICD-10-CM | POA: Diagnosis not present

## 2018-02-06 DIAGNOSIS — J441 Chronic obstructive pulmonary disease with (acute) exacerbation: Secondary | ICD-10-CM | POA: Diagnosis not present

## 2018-02-06 DIAGNOSIS — Z9221 Personal history of antineoplastic chemotherapy: Secondary | ICD-10-CM | POA: Diagnosis not present

## 2018-02-06 DIAGNOSIS — M810 Age-related osteoporosis without current pathological fracture: Secondary | ICD-10-CM | POA: Diagnosis present

## 2018-02-06 DIAGNOSIS — I5032 Chronic diastolic (congestive) heart failure: Secondary | ICD-10-CM | POA: Diagnosis not present

## 2018-02-06 DIAGNOSIS — J9690 Respiratory failure, unspecified, unspecified whether with hypoxia or hypercapnia: Secondary | ICD-10-CM | POA: Diagnosis present

## 2018-02-06 DIAGNOSIS — I2510 Atherosclerotic heart disease of native coronary artery without angina pectoris: Secondary | ICD-10-CM | POA: Diagnosis present

## 2018-02-06 DIAGNOSIS — Z66 Do not resuscitate: Secondary | ICD-10-CM | POA: Diagnosis present

## 2018-02-06 LAB — PROCALCITONIN: Procalcitonin: <0.1 ng/mL

## 2018-02-06 LAB — ECHOCARDIOGRAM COMPLETE
E decel time: 197 msec
FS: 47 % — AB (ref 28–44)
Height: 64 in
IVS/LV PW RATIO, ED: 1.01
LA ID, A-P, ES: 34 mm
LA diam end sys: 34 mm
LA diam index: 1.76 cm/m2
LA vol A4C: 31.6 ml
LA vol index: 18.6 mL/m2
LA vol: 35.9 mL
LV PW d: 11 mm — AB (ref 0.6–1.1)
LV dias vol index: 30 mL/m2
LV dias vol: 57 mL (ref 46–106)
LV sys vol index: 9 mL/m2
LV sys vol: 18 mL (ref 14–42)
LVOT SV: 73 mL
LVOT VTI: 28.7 cm
LVOT area: 2.54 cm2
LVOT diameter: 18 mm
LVOT peak grad rest: 6 mmHg
LVOT peak vel: 127 cm/s
MV Dec: 197
MV Peak grad: 5 mmHg
MV pk A vel: 139 m/s
MV pk E vel: 117 m/s
Simpson's disk: 69
Stroke v: 40 ml
TAPSE: 14.6 mm
Weight: 2821.89 oz

## 2018-02-06 LAB — TROPONIN I
Troponin I: 0.07 ng/mL (ref <0.03)
Troponin I: 0.07 ng/mL (ref <0.03)
Troponin I: 0.07 ng/mL (ref <0.03)

## 2018-02-06 LAB — GLUCOSE, CAPILLARY
Glucose-Capillary: 190 mg/dL — ABNORMAL HIGH (ref 65–99)
Glucose-Capillary: 190 mg/dL — ABNORMAL HIGH (ref 65–99)
Glucose-Capillary: 155 mg/dL — ABNORMAL HIGH (ref 65–99)

## 2018-02-06 MED ORDER — BUDESONIDE 0.5 MG/2ML IN SUSP
0.5000 mg | Freq: Two times a day (BID) | RESPIRATORY_TRACT | Status: DC
  Administered 2018-02-06 – 2018-02-08 (×5): 0.5 mg via RESPIRATORY_TRACT
  Filled 2018-02-06 (×11): qty 2

## 2018-02-06 MED ORDER — ZOLPIDEM TARTRATE 5 MG PO TABS
5.0000 mg | ORAL_TABLET | Freq: Every evening | ORAL | Status: DC | PRN
  Administered 2018-02-07 (×2): 5 mg via ORAL
  Filled 2018-02-06 (×2): qty 1

## 2018-02-06 MED ORDER — POTASSIUM CHLORIDE CRYS ER 10 MEQ PO TBCR
10.0000 meq | EXTENDED_RELEASE_TABLET | Freq: Every day | ORAL | Status: DC
  Administered 2018-02-06 – 2018-02-08 (×3): 10 meq via ORAL
  Filled 2018-02-06 (×3): qty 1

## 2018-02-06 MED ORDER — ATORVASTATIN CALCIUM 40 MG PO TABS
40.0000 mg | ORAL_TABLET | Freq: Every day | ORAL | Status: DC
  Administered 2018-02-06 – 2018-02-07 (×2): 40 mg via ORAL
  Filled 2018-02-06 (×2): qty 1

## 2018-02-06 MED ORDER — ACETAMINOPHEN 325 MG PO TABS: 650.0000 mg | ORAL_TABLET | Freq: Four times a day (QID) | ORAL | Status: DC | PRN

## 2018-02-06 MED ORDER — INSULIN ASPART 100 UNIT/ML ~~LOC~~ SOLN
0.0000 [IU] | Freq: Three times a day (TID) | SUBCUTANEOUS | Status: DC
  Administered 2018-02-06 (×2): 3 [IU] via SUBCUTANEOUS
  Administered 2018-02-07: 5 [IU] via SUBCUTANEOUS
  Administered 2018-02-07 (×2): 2 [IU] via SUBCUTANEOUS
  Administered 2018-02-08: 3 [IU] via SUBCUTANEOUS

## 2018-02-06 MED ORDER — ASPIRIN EC 81 MG PO TBEC
81.0000 mg | DELAYED_RELEASE_TABLET | Freq: Every day | ORAL | Status: DC
  Administered 2018-02-06 – 2018-02-08 (×3): 81 mg via ORAL
  Filled 2018-02-06 (×3): qty 1

## 2018-02-06 MED ORDER — INSULIN GLARGINE 100 UNIT/ML ~~LOC~~ SOLN
15.0000 [IU] | Freq: Every day | SUBCUTANEOUS | Status: DC
  Administered 2018-02-06 – 2018-02-07 (×2): 15 [IU] via SUBCUTANEOUS
  Filled 2018-02-06 (×4): qty 0.15

## 2018-02-06 MED ORDER — NITROGLYCERIN 0.4 MG SL SUBL: 0.4000 mg | SUBLINGUAL_TABLET | SUBLINGUAL | Status: DC | PRN

## 2018-02-06 MED ORDER — FUROSEMIDE 10 MG/ML IJ SOLN
20.0000 mg | Freq: Once | INTRAMUSCULAR | Status: AC
  Administered 2018-02-06: 20 mg via INTRAVENOUS
  Filled 2018-02-06: qty 2

## 2018-02-06 MED ORDER — METOPROLOL SUCCINATE ER 25 MG PO TB24
25.0000 mg | ORAL_TABLET | Freq: Every day | ORAL | Status: DC
  Administered 2018-02-06 – 2018-02-08 (×3): 25 mg via ORAL
  Filled 2018-02-06 (×3): qty 1

## 2018-02-06 MED ORDER — SODIUM CHLORIDE 0.9 % IV SOLN: 250.0000 mL | INTRAVENOUS | Status: DC | PRN

## 2018-02-06 MED ORDER — SALINE SPRAY 0.65 % NA SOLN: 1.0000 | NASAL | Status: DC | PRN

## 2018-02-06 MED ORDER — IPRATROPIUM-ALBUTEROL 0.5-2.5 (3) MG/3ML IN SOLN
3.0000 mL | RESPIRATORY_TRACT | Status: DC
  Administered 2018-02-06 – 2018-02-07 (×10): 3 mL via RESPIRATORY_TRACT
  Filled 2018-02-06 (×11): qty 3

## 2018-02-06 MED ORDER — POLYETHYLENE GLYCOL 3350 17 GM/SCOOP PO POWD
17.0000 g | Freq: Every day | ORAL | Status: DC | PRN
  Filled 2018-02-06: qty 255

## 2018-02-06 MED ORDER — GARLIC 300 MG PO TABS: 300.0000 mg | ORAL_TABLET | Freq: Every day | ORAL | Status: DC

## 2018-02-06 MED ORDER — INSULIN ASPART 100 UNIT/ML ~~LOC~~ SOLN: 0.0000 [IU] | Freq: Every day | SUBCUTANEOUS | Status: DC

## 2018-02-06 MED ORDER — CARBIDOPA-LEVODOPA 25-100 MG PO TABS
1.0000 | ORAL_TABLET | Freq: Two times a day (BID) | ORAL | Status: DC
  Administered 2018-02-06 – 2018-02-08 (×5): 1 via ORAL
  Filled 2018-02-06 (×5): qty 1

## 2018-02-06 MED ORDER — ALBUTEROL SULFATE (2.5 MG/3ML) 0.083% IN NEBU
5.0000 mg | INHALATION_SOLUTION | Freq: Once | RESPIRATORY_TRACT | Status: AC
  Administered 2018-02-06: 5 mg via RESPIRATORY_TRACT
  Filled 2018-02-06: qty 6

## 2018-02-06 MED ORDER — LINAGLIPTIN 5 MG PO TABS
5.0000 mg | ORAL_TABLET | Freq: Every day | ORAL | Status: DC
  Administered 2018-02-06 – 2018-02-08 (×3): 5 mg via ORAL
  Filled 2018-02-06 (×3): qty 1

## 2018-02-06 MED ORDER — ONDANSETRON HCL 4 MG PO TABS: 4.0000 mg | ORAL_TABLET | Freq: Four times a day (QID) | ORAL | Status: DC | PRN

## 2018-02-06 MED ORDER — ONDANSETRON HCL 4 MG/2ML IJ SOLN: 4.0000 mg | Freq: Four times a day (QID) | INTRAMUSCULAR | Status: DC | PRN

## 2018-02-06 MED ORDER — ENOXAPARIN SODIUM 30 MG/0.3ML ~~LOC~~ SOLN
30.0000 mg | SUBCUTANEOUS | Status: DC
  Administered 2018-02-07 – 2018-02-08 (×2): 30 mg via SUBCUTANEOUS
  Filled 2018-02-06 (×2): qty 0.3

## 2018-02-06 MED ORDER — METHYLPREDNISOLONE SODIUM SUCC 125 MG IJ SOLR
60.0000 mg | Freq: Four times a day (QID) | INTRAMUSCULAR | Status: DC
  Administered 2018-02-06 – 2018-02-08 (×9): 60 mg via INTRAVENOUS
  Filled 2018-02-06 (×9): qty 2

## 2018-02-06 MED ORDER — SODIUM CHLORIDE 0.9% FLUSH
3.0000 mL | Freq: Two times a day (BID) | INTRAVENOUS | Status: DC
  Administered 2018-02-06 – 2018-02-07 (×2): 3 mL via INTRAVENOUS

## 2018-02-06 MED ORDER — VITAMIN B-6 50 MG PO TABS
100.0000 mg | ORAL_TABLET | Freq: Every day | ORAL | Status: DC
  Administered 2018-02-06 – 2018-02-08 (×3): 100 mg via ORAL
  Filled 2018-02-06 (×2): qty 1
  Filled 2018-02-06 (×3): qty 2
  Filled 2018-02-06: qty 1

## 2018-02-06 MED ORDER — BENZONATATE 100 MG PO CAPS
100.0000 mg | ORAL_CAPSULE | Freq: Three times a day (TID) | ORAL | Status: DC
  Administered 2018-02-06 – 2018-02-08 (×6): 100 mg via ORAL
  Filled 2018-02-06 (×6): qty 1

## 2018-02-06 MED ORDER — MECLIZINE HCL 12.5 MG PO TABS
25.0000 mg | ORAL_TABLET | Freq: Every day | ORAL | Status: DC
  Administered 2018-02-06 – 2018-02-08 (×3): 25 mg via ORAL
  Filled 2018-02-06 (×3): qty 2

## 2018-02-06 MED ORDER — TRAMADOL HCL 50 MG PO TABS: 50.0000 mg | ORAL_TABLET | Freq: Two times a day (BID) | ORAL | Status: DC | PRN

## 2018-02-06 MED ORDER — DOXYCYCLINE HYCLATE 100 MG PO TABS
100.0000 mg | ORAL_TABLET | Freq: Two times a day (BID) | ORAL | Status: DC
  Administered 2018-02-06 – 2018-02-08 (×6): 100 mg via ORAL
  Filled 2018-02-06 (×6): qty 1

## 2018-02-06 MED ORDER — GUAIFENESIN ER 600 MG PO TB12
600.0000 mg | ORAL_TABLET | Freq: Two times a day (BID) | ORAL | Status: DC
  Administered 2018-02-06 – 2018-02-08 (×5): 600 mg via ORAL
  Filled 2018-02-06 (×5): qty 1

## 2018-02-06 MED ORDER — ENOXAPARIN SODIUM 40 MG/0.4ML ~~LOC~~ SOLN
40.0000 mg | SUBCUTANEOUS | Status: DC
  Administered 2018-02-06: 40 mg via SUBCUTANEOUS
  Filled 2018-02-06: qty 0.4

## 2018-02-06 MED ORDER — POLYETHYLENE GLYCOL 3350 17 G PO PACK: 17.0000 g | PACK | Freq: Every day | ORAL | Status: DC | PRN

## 2018-02-06 MED ORDER — SODIUM CHLORIDE 0.9% FLUSH: 3.0000 mL | INTRAVENOUS | Status: DC | PRN

## 2018-02-06 MED ORDER — LORATADINE 10 MG PO TABS
10.0000 mg | ORAL_TABLET | Freq: Every day | ORAL | Status: DC
  Administered 2018-02-06 – 2018-02-08 (×3): 10 mg via ORAL
  Filled 2018-02-06 (×3): qty 1

## 2018-02-06 MED ORDER — BUPROPION HCL ER (SR) 150 MG PO TB12
150.0000 mg | ORAL_TABLET | Freq: Two times a day (BID) | ORAL | Status: DC
  Administered 2018-02-06 – 2018-02-08 (×5): 150 mg via ORAL
  Filled 2018-02-06 (×5): qty 1

## 2018-02-06 MED ORDER — OMEGA-3-ACID ETHYL ESTERS 1 G PO CAPS
1.0000 g | ORAL_CAPSULE | Freq: Every day | ORAL | Status: DC
  Administered 2018-02-06 – 2018-02-08 (×3): 1 g via ORAL
  Filled 2018-02-06 (×3): qty 1

## 2018-02-06 MED ORDER — PANTOPRAZOLE SODIUM 40 MG PO TBEC
40.0000 mg | DELAYED_RELEASE_TABLET | Freq: Every day | ORAL | Status: DC
  Administered 2018-02-06 – 2018-02-08 (×3): 40 mg via ORAL
  Filled 2018-02-06 (×3): qty 1

## 2018-02-06 MED ORDER — GABAPENTIN 300 MG PO CAPS
600.0000 mg | ORAL_CAPSULE | Freq: Every day | ORAL | Status: DC
  Administered 2018-02-06 – 2018-02-07 (×2): 600 mg via ORAL
  Filled 2018-02-06 (×2): qty 2

## 2018-02-06 MED ORDER — VITAMIN D 1000 UNITS PO TABS
1000.0000 [IU] | ORAL_TABLET | Freq: Every day | ORAL | Status: DC
  Administered 2018-02-06 – 2018-02-08 (×3): 1000 [IU] via ORAL
  Filled 2018-02-06 (×6): qty 1

## 2018-02-06 MED ORDER — POTASSIUM CHLORIDE ER 10 MEQ PO TBCR
10.0000 meq | EXTENDED_RELEASE_TABLET | Freq: Every day | ORAL | Status: DC
  Filled 2018-02-06 (×3): qty 1

## 2018-02-06 MED ORDER — ACETAMINOPHEN 650 MG RE SUPP: 650.0000 mg | Freq: Four times a day (QID) | RECTAL | Status: DC | PRN

## 2018-02-06 MED ORDER — ROPINIROLE HCL 1 MG PO TABS
0.5000 mg | ORAL_TABLET | Freq: Two times a day (BID) | ORAL | Status: DC
  Administered 2018-02-06 – 2018-02-08 (×5): 0.5 mg via ORAL
  Filled 2018-02-06: qty 2
  Filled 2018-02-06 (×10): qty 1

## 2018-02-06 NOTE — Progress Notes (Signed)
PHARMACIST - PHYSICIAN ORDER COMMUNICATION  CONCERNING: P&T Medication Policy on Herbal Medications  DESCRIPTION:  This patient's order for:  Garlic tabs  has been noted.  This product(s) is classified as an "herbal" or natural product. Due to a lack of definitive safety studies or FDA approval, nonstandard manufacturing practices, plus the potential risk of unknown drug-drug interactions while on inpatient medications, the Pharmacy and Therapeutics Committee does not permit the use of "herbal" or natural products of this type within [facility].   ACTION TAKEN: The pharmacy department is unable to verify this order at this time and your patient has been informed of this safety policy. Please reevaluate patient's clinical condition at discharge and address if the herbal or natural product(s) should be resumed at that time.  Thank you,  Fabio Neighbors, PharmD

## 2018-02-06 NOTE — Progress Notes (Signed)
*  PRELIMINARY RESULTS* Echocardiogram 2D Echocardiogram has been performed.  Samuel Germany 02/06/2018, 11:03 AM

## 2018-02-06 NOTE — H&P (Signed)
History and Physical    Angela Lawson SFK:[id number] DOB: 1934/04/04 DOA: 02/05/2018  PCP: Rosita Fire, MD   Patient coming from: Home  Chief Complaint: Dyspnea with exertion  HPI: Angela Lawson is a 84 y.o. female with medical history significant for chronic diastolic congestive heart failure, COPD with chronic hypoxemia on 3 L oxygen at home, diabetes, hypertension, CKD stage III, and non-small cell lung cancer who presented to the emergency department with complaints of worsening dyspnea, with wheezing and cough over the last 1 week.  She is actually seen in the ED 2 days ago and had clear chest x-rays and received a breathing treatment as well as IV methylprednisolone with some improvement in her symptoms, and therefore she was discharged home with doxycycline, prednisone, and frequent nebulizers.  She has not taken her doxycycline or prednisone as of yet but has been using her nebulizer machine with minimal relief.  She states that her symptoms are worsened with any movement or exertion and also with laying flat. She denies any fever or chills. She has an occasional productive cough with greenish sputum. She denies any chest pain, palpitations, diaphoresis, nausea, or vomiting. Denies lower extremity edema.   ED Course: Vital signs are stable and she is currently on 3 L nasal cannula and saturating well with no acute respiratory distress noted.  Troponin is 0.06 and EKG demonstrates no significant findings.  Chest x-ray with some mild vascular congestion noted and BNP is 230.  WBC count is 15.7 and creatinine is 1.48 which is near her baseline of 1.4.  She has been given some breathing treatments as well as oral prednisone and is about to receive some Lasix.  She reports no improvement in her symptoms thus far.  Review of Systems: All others reviewed and otherwise negative.  Past Medical History:  Diagnosis Date  . Adenocarcinoma of lung ([place])    Left lung 2009, resected  . Anginal pain  ([place])   . Arthritis   . Back pain   . CHF (congestive heart failure) ([place])   . COPD (chronic obstructive pulmonary disease) ([place])   . Diverticulitis   . Dyslipidemia   . Essential hypertension   . H/O ventral hernia   . Non-obstructive CAD    a. 04/2015 NSTEMI/Cath: LAD 10p, LCX 17m, RCA 83m, 20d, EF 35-40 w/ apical ballooning.  . On home O2    3L N/C  . Osteoporosis   . Osteoporosis 11/04/2015   Managed by Dr. Legrand Rams   . Parkinson's disease ([place])   . Shortness of breath   . Takotsubo cardiomyopathy    a. 04/2015 Echo: EF 45-50%, mid-dist anterior/apical/inferoapical HK w/ hyperdynamic base. Gr 1 DD, mild AI, mild-mod MR, triv TR, PASP 69mmHg;  b. 04/2015 LV gram: Ef 35-40% w/ apical ballooning.  . Type II diabetes mellitus ([place])   . Ventricular bigeminy    a. 04/2015 in setting of NSTEMI/Takotsubo.    Past Surgical History:  Procedure Laterality Date  . ABDOMINAL HYSTERECTOMY    . CARDIAC CATHETERIZATION N/A 04/17/2015   Procedure: Left Heart Cath and Coronary Angiography;  Surgeon: Troy Sine, MD;  Location: [facility];  Service: Cardiovascular;  Laterality: N/A;  . CHOLECYSTECTOMY    . COLONOSCOPY N/A 09/18/2014   Procedure: COLONOSCOPY;  Surgeon: Danie Binder, MD;  Location: [facility];  Service: Endoscopy;  Laterality: N/A;  8:30 AM - moved to 10:30 Rosendo Gros to notify pt  . ECTOPIC PREGNANCY SURGERY    .  INCISIONAL HERNIA REPAIR N/A 08/26/2013   Procedure: HERNIA REPAIR INCISIONAL WITH MESH;  Surgeon: Jamesetta So, MD;  Location: [facility];  Service: General;  Laterality: N/A;  . IR FLUORO GUIDE PORT INSERTION RIGHT  04/30/2017  . IR US GUIDE VASC ACCESS RIGHT  04/30/2017  . LUNG CANCER SURGERY    . VIDEO BRONCHOSCOPY WITH ENDOBRONCHIAL NAVIGATION N/A 04/23/2017   Procedure: VIDEO BRONCHOSCOPY WITH ENDOBRONCHIAL NAVIGATION;  Surgeon: Melrose Nakayama, MD;  Location: [facility];  Service: Thoracic;  Laterality: N/A;  . VIDEO BRONCHOSCOPY WITH ENDOBRONCHIAL  ULTRASOUND N/A 04/23/2017   Procedure: VIDEO BRONCHOSCOPY WITH ENDOBRONCHIAL ULTRASOUND;  Surgeon: Melrose Nakayama, MD;  Location: [facility];  Service: Thoracic;  Laterality: N/A;     reports that she quit smoking about 11 years ago. Her smoking use included cigarettes. She quit after 20.00 years of use. she has never used smokeless tobacco. She reports that she does not drink alcohol or use drugs.  Allergies  Allergen Reactions  . Lisinopril Cough    Family History  Problem Relation Age of Onset  . Diabetes Mother   . Hypertension Mother   . Diabetes Father   . Asthma Unknown   . Cancer Unknown   . Heart attack Sister        X2  . Colon cancer Neg Hx     Prior to Admission medications   Medication Sig Start Date End Date Taking? Authorizing Provider  albuterol (PROAIR HFA) 108 (90 BASE) MCG/ACT inhaler Inhale 2 puffs into the lungs every 4 (four) hours as needed for wheezing or shortness of breath.    [provider]  alendronate (FOSAMAX) 70 MG tablet Take 70 mg by mouth every Friday.     [provider]  aspirin EC 81 MG tablet Take 81 mg by mouth daily.    [provider]  Atezolizumab (TECENTRIQ IV) Inject into the vein. Every 3 weeks    [provider]  atorvastatin (LIPITOR) 40 MG tablet Take 40 mg by mouth at bedtime.     [provider]  azithromycin (ZITHROMAX) 250 MG tablet TAKE 2 TABLETS BY MOUTH TODAY, THEN TAKE 1 TABLET DAILY FOR 4 DAYS 01/28/18   [provider]  B-D ULTRAFINE III SHORT PEN 31G X 8 MM MISC See admin instructions. 12/27/17   [provider]  benzonatate (TESSALON) 100 MG capsule Take 1 capsule (100 mg total) by mouth every 8 (eight) hours. 02/04/18   Tanna Furry, MD  buPROPion [facility] SR) 150 MG 12 hr tablet Take 150 mg by mouth 2 (two) times daily.     [provider]  carbidopa-levodopa (SINEMET IR) 25-100 MG tablet Take 1 tablet by mouth 2 (two) times daily.    [provider]  cholecalciferol (VITAMIN D) 1000 units tablet Take 1,000 Units by mouth daily.    [provider]  clotrimazole-betamethasone (LOTRISONE) cream Apply 1 application topically 2 (two) times daily. 09/10/17   Holley Bouche, NP  doxycycline (VIBRAMYCIN) 100 MG capsule Take 1 capsule (100 mg total) by mouth 2 (two) times daily. 02/04/18   Tanna Furry, MD  furosemide (LASIX) 40 MG tablet Take 1 tablet (40 mg total) by mouth daily. Pt is able to take one additional tablet daily for weight increases of 3lbs in a day or 5lbs in a week. Resume on 10/20 09/23/17   Kathie Dike, MD  gabapentin (NEURONTIN) 300 MG capsule Take 600 mg by mouth at bedtime.     [provider]  Garlic 124 MG TABS Take 300 mg by mouth daily.    [provider]  guaiFENesin (MUCINEX) 600 MG 12 hr tablet Take 1 tablet (600 mg total) by mouth 2 (two) times daily. 09/23/17 09/23/18  Kathie Dike, MD  ipratropium-albuterol (DUONEB) 0.5-2.5 (3) MG/3ML SOLN Inhale 3 mLs into the lungs every 6 (six) hours as needed (for wheezing/shortness of breath).     [provider]  KLOR-CON 10 10 MEQ tablet TAKE 1 TABLET (10 MEQ TOTAL) BY MOUTH DAILY. *FURTHER REFILLS NEED TO BE AUTHORIZED BY PCP* Patient taking differently: TAKE 1 TABLET (10 MEQ TOTAL) BY MOUTH DAILY. 08/11/17   Lorretta Harp, MD  LANTUS SOLOSTAR 100 UNIT/ML Solostar Pen Inject 15 Units into the skin at bedtime.     [provider]  lidocaine-prilocaine (EMLA) cream Apply to affected area once Patient taking differently: Apply 1 application topically daily as needed.  05/19/17   Brunetta Genera, MD  linagliptin (TRADJENTA) 5 MG TABS tablet Take 5 mg by mouth daily.    [provider]  loratadine (CLARITIN) 10 MG tablet Take 10 mg by mouth daily.    [provider]  meclizine (ANTIVERT) 25 MG tablet Take 25 mg by mouth daily.     [provider]  metoprolol succinate (TOPROL-XL) 25 MG  24 hr tablet Take 1 tablet (25 mg total) by mouth daily. 06/26/15   Jettie Booze, MD  nitroGLYCERIN (NITROSTAT) 0.4 MG SL tablet Place 0.4 mg under the tongue every 5 (five) minutes as needed for chest pain.    [provider]  Omega-3 Fatty Acids (FISH OIL PO) Take 1 capsule by mouth daily.    [provider]  omeprazole (PRILOSEC) 40 MG capsule Take 40 mg by mouth daily.    [provider]  ondansetron (ZOFRAN) 8 MG tablet Take 1 tablet (8 mg total) by mouth 2 (two) times daily as needed (Nausea or vomiting). 05/19/17   Brunetta Genera, MD  OXYGEN Inhale 3 L into the lungs continuous.    [provider]  polyethylene glycol powder (GLYCOLAX/MIRALAX) powder Take 17 g by mouth daily as needed.    [provider]  predniSONE (DELTASONE) 20 MG tablet Take 1 tablet (20 mg total) by mouth 2 (two) times daily with a meal. 02/04/18   Tanna Furry, MD  prochlorperazine (COMPAZINE) 10 MG tablet Take 1 tablet (10 mg total) by mouth every 6 (six) hours as needed (Nausea or vomiting). 05/19/17   Brunetta Genera, MD  pyridoxine (B-6) 100 MG tablet Take 100 mg by mouth daily.     [provider]  rOPINIRole (REQUIP) 0.5 MG tablet Take 0.5 mg by mouth 2 (two) times daily.     [provider]  sodium chloride (OCEAN) 0.65 % SOLN nasal spray Place 1 spray into both nostrils every 3 (three) hours as needed for congestion.     [provider]  traMADol (ULTRAM) 50 MG tablet Take 50 mg by mouth every 12 (twelve) hours as needed for moderate pain.     [provider]  zolpidem (AMBIEN) 5 MG tablet Take 5 mg by mouth at bedtime as needed for sleep.    [provider]    Physical Exam: Vitals:   02/05/18 2206 02/05/18 2230 02/05/18 2300 02/06/18 0039  BP:  137/63 137/78   Pulse:  96 96   Resp:  16 13   Temp:      TempSrc:  SpO2: 98% 97% 97% 96%  Weight:      Height:        Constitutional: NAD, calm,  comfortable Vitals:   02/05/18 2206 02/05/18 2230 02/05/18 2300 02/06/18 0039  BP:  137/63 137/78   Pulse:  96 96   Resp:  16 13   Temp:      TempSrc:      SpO2: 98% 97% 97% 96%  Weight:      Height:       Eyes: lids and conjunctivae normal ENMT: Mucous membranes are moist.  Neck: normal, supple Respiratory: diminished to auscultation bilaterally. Normal respiratory effort. No accessory muscle use. No significant wheezing noted. On 3L [place]. Cardiovascular: Regular rate and rhythm, no murmurs. No extremity edema. Abdomen: no tenderness, no distention. Bowel sounds positive.  Musculoskeletal:  No joint deformity upper and lower extremities.   Skin: no rashes, lesions, ulcers.  Psychiatric: Normal judgment and insight. Alert and oriented x 3. Normal mood.   Labs on Admission: I have personally reviewed following labs and imaging studies  CBC: Recent Labs  Lab 02/04/18 1019 02/05/18 2211  WBC 9.0 15.7*  NEUTROABS 5.8 13.7*  HGB 11.5* 11.5*  HCT 38.5 37.9  MCV 95.1 93.3  PLT 244 517   Basic Metabolic Panel: Recent Labs  Lab 02/04/18 1019 02/05/18 2211  NA 137 138  K 4.1 5.0  CL 100* 101  CO2 25 23  GLUCOSE 148* 147*  BUN 17 32*  CREATININE 1.34* 1.48*  CALCIUM 10.0 10.8*   GFR: Estimated Creatinine Clearance: 29.5 mL/min (A) (by C-G formula based on SCr of 1.48 mg/dL (H)). Liver Function Tests: Recent Labs  Lab 02/04/18 1019 02/05/18 2211  AST 28 30  ALT 6* 25  ALKPHOS 52 50  BILITOT 0.5 0.5  PROT 7.1 7.4  ALBUMIN 3.8 4.0   No results for input(s): LIPASE, AMYLASE in the last 168 hours. No results for input(s): AMMONIA in the last 168 hours. Coagulation Profile: No results for input(s): INR, PROTIME in the last 168 hours. Cardiac Enzymes: Recent Labs  Lab 02/04/18 1610  TROPONINI 0.06*   BNP (last 3 results) No results for input(s): PROBNP in the last 8760 hours. HbA1C: No results for input(s): HGBA1C in the last 72 hours. CBG: No results for  input(s): GLUCAP in the last 168 hours. Lipid Profile: No results for input(s): CHOL, HDL, LDLCALC, TRIG, CHOLHDL, LDLDIRECT in the last 72 hours. Thyroid Function Tests: Recent Labs    02/04/18 1019  TSH 1.300   Anemia Panel: No results for input(s): VITAMINB12, FOLATE, FERRITIN, TIBC, IRON, RETICCTPCT in the last 72 hours. Urine analysis:    Component Value Date/Time   COLORURINE YELLOW 04/26/2017 2112   APPEARANCEUR CLEAR 04/26/2017 2112   LABSPEC 1.008 04/26/2017 2112   PHURINE 6.0 04/26/2017 2112   GLUCOSEU NEGATIVE 04/26/2017 2112   HGBUR NEGATIVE 04/26/2017 2112   BILIRUBINUR NEGATIVE 04/26/2017 2112   KETONESUR NEGATIVE 04/26/2017 2112   PROTEINUR NEGATIVE 04/26/2017 2112   UROBILINOGEN 0.2 04/19/2012 1411   NITRITE NEGATIVE 04/26/2017 2112   LEUKOCYTESUR TRACE (A) 04/26/2017 2112    Radiological Exams on Admission: Dg Chest 2 View  Result Date: 02/04/2018 CLINICAL DATA:  Stage IV lung cancer.  Wheezing. EXAM: CHEST  2 VIEW COMPARISON:  Radiographs 12/03/2017 and prior FINDINGS: Cardiomediastinal silhouette is unchanged. Continued LEFT LOWER lung opacity again noted and unchanged. A RIGHT IJ Port-A-Cath is present with tip overlying the LOWER SVC. There is no evidence of new pulmonary opacity, pleural  effusion or pneumothorax. IMPRESSION: Cardiomegaly without evidence of acute cardiopulmonary disease. Electronically Signed   By: Margarette Canada M.D.   On: 02/04/2018 14:32   Dg Chest Portable 1 View  Result Date: 02/05/2018 CLINICAL DATA:  Increasing shortness of breath. EXAM: PORTABLE CHEST 1 VIEW COMPARISON:  Frontal and lateral views yesterday. Most recent chest CT 10/08/2017 FINDINGS: Tip of the right chest port remains in place in the mid SVC. Unchanged volume loss in the left hemithorax with basilar pleuroparenchymal scarring. Unchanged heart size and mediastinal contours. Mild bronchial thickening and central vascular prominence. No confluent consolidation. No pneumothorax  or large pleural effusion. Chronic change of both shoulders. IMPRESSION: 1. Mild vascular congestion and bronchial thickening. 2. Stable volume loss and scarring in the left hemithorax. Electronically Signed   By: Jeb Levering M.D.   On: 02/05/2018 21:39    EKG: Independently reviewed. SR 95bpm. No sign of right heart strain.  Assessment/Plan Principal Problem:   Acute on chronic respiratory failure with hypoxia (HCC) Active Problems:   Adenocarcinoma of lung (HCC)   CKD (chronic kidney disease) stage 3, GFR 30-59 ml/min (HCC)   Essential hypertension   Type II diabetes mellitus (HCC)   Chronic diastolic CHF (congestive heart failure) (HCC)   COPD with acute exacerbation ([place])    1. Acute on chronic hypoxemic respiratory failure with dyspnea on exertion-multifactorial.  I believe patient is experiencing an element of some volume overload combined with a recurrence of her COPD exacerbation.  Continue treatment with duo nebs as well as IV steroids and Pulmicort twice daily.  I will also add doxycycline and check sputum culture as well as respiratory panel, urine Legionella, strep pneumonia, and pro-calcitonin.  Given the dyspnea on exertion I will also add a 2D echocardiogram as this was previously performed on 07/2015 and there is concern for possible development of pulmonary hypertension versus a valvular process. 2. Acute COPD exacerbation.  As noted above with breathing treatments and steroids both inhaled and IV.  Doxycycline. 3. Possible acute on chronic diastolic congestive heart failure.  Agree with initial diuresis with Lasix 20 mg in ED.  Strict I's and O's and daily weights with fluid restriction while on dysphagia 3 diet. 4. Elevated troponin-asymptomatic.  Continue to trend.  Check 2D echocardiogram as noted above. 5. Type 2 diabetes.  Maintain on home medications with additional sliding scale insulin coverage.  Recent A1c check at 7%. 6. Hypertension.  Continue current home  medications.   DVT prophylaxis: Lovenox Code Status: DNR Family Communication: Daughter at bedside Disposition Plan: Steroids and breathing tx with diuresis Consults called:None Admission status: Inpatient, med-surg   Rudolf Blizard Darleen Crocker DO [facility] Pager [phone]  If 7PM-7AM, please contact night-coverage [email or web address] Password [facility]  02/06/2018, 12:48 AM

## 2018-02-06 NOTE — Progress Notes (Signed)
Patient admitted to the hospital earlier this morning by Dr. Manuella Ghazi  Patient seen and examined.  She is feeling better now.  Does not feel that her breathing is back to baseline, but is better than it was when she arrived.  She does have some cough.  Overall wheezing is better.  She has diminished breath sounds bilaterally.  No significant lower extremity edema.  Patient has been admitted with acute on chronic respiratory failure.  Felt to be related to COPD exacerbation.  She is chronically on 3 L of oxygen.  It appears that her baseline functional status is poor and she does not have much exercise tolerance.  Currently on intravenous steroids and antibiotics.  Echocardiogram done and is unremarkable.  She does not have any evidence of volume overload.  Continue current treatments.  Angela Lawson

## 2018-02-07 DIAGNOSIS — E1122 Type 2 diabetes mellitus with diabetic chronic kidney disease: Secondary | ICD-10-CM

## 2018-02-07 DIAGNOSIS — N183 Chronic kidney disease, stage 3 (moderate): Secondary | ICD-10-CM

## 2018-02-07 DIAGNOSIS — C3490 Malignant neoplasm of unspecified part of unspecified bronchus or lung: Secondary | ICD-10-CM

## 2018-02-07 DIAGNOSIS — J441 Chronic obstructive pulmonary disease with (acute) exacerbation: Principal | ICD-10-CM

## 2018-02-07 DIAGNOSIS — Z794 Long term (current) use of insulin: Secondary | ICD-10-CM

## 2018-02-07 DIAGNOSIS — I5032 Chronic diastolic (congestive) heart failure: Secondary | ICD-10-CM

## 2018-02-07 DIAGNOSIS — I10 Essential (primary) hypertension: Secondary | ICD-10-CM

## 2018-02-07 LAB — CBC
HCT: 37.1 % (ref 36.0–46.0)
Hemoglobin: 11.3 g/dL — ABNORMAL LOW (ref 12.0–15.0)
MCH: 28.5 pg (ref 26.0–34.0)
MCHC: 30.5 g/dL (ref 30.0–36.0)
MCV: 93.5 fL (ref 78.0–100.0)
Platelets: 263 10*3/uL (ref 150–400)
RBC: 3.97 MIL/uL (ref 3.87–5.11)
RDW: 16 % — ABNORMAL HIGH (ref 11.5–15.5)
WBC: 16.3 10*3/uL — ABNORMAL HIGH (ref 4.0–10.5)

## 2018-02-07 LAB — GLUCOSE, CAPILLARY
Glucose-Capillary: 143 mg/dL — ABNORMAL HIGH (ref 65–99)
Glucose-Capillary: 133 mg/dL — ABNORMAL HIGH (ref 65–99)
Glucose-Capillary: 244 mg/dL — ABNORMAL HIGH (ref 65–99)
Glucose-Capillary: 184 mg/dL — ABNORMAL HIGH (ref 65–99)

## 2018-02-07 LAB — BASIC METABOLIC PANEL
Anion gap: 7 (ref 5–15)
BUN: 36 mg/dL — ABNORMAL HIGH (ref 6–20)
CO2: 27 mmol/L (ref 22–32)
Calcium: 9.9 mg/dL (ref 8.9–10.3)
Chloride: 105 mmol/L (ref 101–111)
Creatinine, Ser: 1.33 mg/dL — ABNORMAL HIGH (ref 0.44–1.00)
GFR calc Af Amer: 42 mL/min — ABNORMAL LOW (ref >60)
GFR calc non Af Amer: 36 mL/min — ABNORMAL LOW (ref >60)
Glucose, Bld: 190 mg/dL — ABNORMAL HIGH (ref 65–99)
Potassium: 4.8 mmol/L (ref 3.5–5.1)
Sodium: 139 mmol/L (ref 135–145)

## 2018-02-07 LAB — PROCALCITONIN: Procalcitonin: <0.1 ng/mL

## 2018-02-07 MED ORDER — FUROSEMIDE 40 MG PO TABS
40.0000 mg | ORAL_TABLET | Freq: Every day | ORAL | Status: DC
  Administered 2018-02-07 – 2018-02-08 (×2): 40 mg via ORAL
  Filled 2018-02-07 (×2): qty 1

## 2018-02-07 NOTE — Progress Notes (Signed)
PROGRESS NOTE    Angela Lawson  JJK:[id number] DOB: 1934-03-01 DOA: 02/05/2018 PCP: Rosita Fire, MD    Brief Narrative:  84 year old female with a history of COPD and chronic respiratory failure on 3 L of oxygen, was admitted to the hospital with dyspnea on exertion felt to be related to COPD exacerbation.  Admitted for bronchodilators, intravenous steroids.  She is slowly improving.   Assessment & Plan:   Principal Problem:   Acute on chronic respiratory failure with hypoxia (HCC) Active Problems:   Adenocarcinoma of lung (HCC)   CKD (chronic kidney disease) stage 3, GFR 30-59 ml/min (HCC)   Essential hypertension   Type II diabetes mellitus (HCC)   Chronic diastolic CHF (congestive heart failure) (HCC)   COPD with acute exacerbation (HCC)   Respiratory failure ([place])   1. Acute on chronic respiratory failure with hypoxia.  She is chronically on 3 L of oxygen.  Decompensation of respiratory status likely related to COPD exacerbation.  Continue to wean down oxygen as tolerated. 2. COPD exacerbation.  Currently on intravenous steroids, bronchodilators and antibiotics.  Continue pulmonary hygiene. 3. Acute on chronic diastolic congestive heart failure, mild, patient received a dose of IV diuretics in the emergency room.  She will be continued on home dose of Lasix.  She does not appear to be particularly volume overloaded. 4. Elevated troponin.  Asymptomatic.  Echocardiogram unremarkable.  Likely related to stress from respiratory decompensation. 5. Diabetes.  Continue on Tradjenta and Lantus.  Continue sliding scale coverage.  Blood sugars are stable. 6. Hypertension.  Blood pressure currently stable.  Continue on Toprol 7. Chronic kidney disease stage III.  Creatinine is currently at baseline.  Continue to monitor. 8. Non-small cell lung cancer.  Last chemotherapy in December.  Continue to follow with cancer Center.   DVT prophylaxis: Lovenox Code Status: DNR Family  Communication: Discussed with daughter at the bedside Disposition Plan: Discharge home once improved   Consultants:     Procedures:   Echo:Normal LV systolic function; mild diastolic dysfunction.  Antimicrobials:   Doxycycline 3/2 >   Subjective: Still feels short of breath, has nonproductive cough, not back to baseline  Objective: Vitals:   02/07/18 0825 02/07/18 1153 02/07/18 1500 02/07/18 1612  BP:   (!) 154/56   Pulse:   86   Resp:   18   Temp:   97.9 F (36.6 C)   TempSrc:   Oral   SpO2: 100% 99% 100% 98%  Weight:      Height:        Intake/Output Summary (Last 24 hours) at 02/07/2018 1748 Last data filed at 02/07/2018 0824 Gross per 24 hour  Intake 240 ml  Output -  Net 240 ml   Filed Weights   02/05/18 2116 02/06/18 0551  Weight: 77.1 kg (170 lb) 80 kg (176 lb 5.9 oz)    Examination:  General exam: Appears calm and comfortable  Respiratory system: Air movement appears better today.  Less wheezing. Cardiovascular system: S1 & S2 heard, RRR. No JVD, murmurs, rubs, gallops or clicks. No pedal edema. Gastrointestinal system: Abdomen is nondistended, soft and nontender. No organomegaly or masses felt. Normal bowel sounds heard. Central nervous system: Alert and oriented. No focal neurological deficits. Extremities: Symmetric 5 x 5 power. Skin: No rashes, lesions or ulcers Psychiatry: Judgement and insight appear normal. Mood & affect appropriate.     Data Reviewed: I have personally reviewed following labs and imaging studies  CBC: Recent Labs  Lab 02/04/18 1019  02/05/18 2211 02/07/18 0619  WBC 9.0 15.7* 16.3*  NEUTROABS 5.8 13.7*  --   HGB 11.5* 11.5* 11.3*  HCT 38.5 37.9 37.1  MCV 95.1 93.3 93.5  PLT 244 258 354   Basic Metabolic Panel: Recent Labs  Lab 02/04/18 1019 02/05/18 2211 02/07/18 0619  NA 137 138 139  K 4.1 5.0 4.8  CL 100* 101 105  CO2 25 23 27   GLUCOSE 148* 147* 190*  BUN 17 32* 36*  CREATININE 1.34* 1.48* 1.33*    CALCIUM 10.0 10.8* 9.9   GFR: Estimated Creatinine Clearance: 33.4 mL/min (A) (by C-G formula based on SCr of 1.33 mg/dL (H)). Liver Function Tests: Recent Labs  Lab 02/04/18 1019 02/05/18 2211  AST 28 30  ALT 6* 25  ALKPHOS 52 50  BILITOT 0.5 0.5  PROT 7.1 7.4  ALBUMIN 3.8 4.0   No results for input(s): LIPASE, AMYLASE in the last 168 hours. No results for input(s): AMMONIA in the last 168 hours. Coagulation Profile: No results for input(s): INR, PROTIME in the last 168 hours. Cardiac Enzymes: Recent Labs  Lab 02/04/18 1610 02/06/18 0137 02/06/18 0730 02/06/18 1238  TROPONINI 0.06* 0.07* 0.07* 0.07*   BNP (last 3 results) No results for input(s): PROBNP in the last 8760 hours. HbA1C: No results for input(s): HGBA1C in the last 72 hours. CBG: Recent Labs  Lab 02/06/18 1631 02/06/18 2120 02/07/18 0741 02/07/18 1132 02/07/18 1615  GLUCAP 190* 155* 143* 133* 244*   Lipid Profile: No results for input(s): CHOL, HDL, LDLCALC, TRIG, CHOLHDL, LDLDIRECT in the last 72 hours. Thyroid Function Tests: No results for input(s): TSH, T4TOTAL, FREET4, T3FREE, THYROIDAB in the last 72 hours. Anemia Panel: No results for input(s): VITAMINB12, FOLATE, FERRITIN, TIBC, IRON, RETICCTPCT in the last 72 hours. Sepsis Labs: Recent Labs  Lab 02/06/18 0137 02/07/18 0619  PROCALCITON <0.10 <0.10    No results found for this or any previous visit (from the past 240 hour(s)).       Radiology Studies: Dg Chest Portable 1 View  Result Date: 02/05/2018 CLINICAL DATA:  Increasing shortness of breath. EXAM: PORTABLE CHEST 1 VIEW COMPARISON:  Frontal and lateral views yesterday. Most recent chest CT 10/08/2017 FINDINGS: Tip of the right chest port remains in place in the mid SVC. Unchanged volume loss in the left hemithorax with basilar pleuroparenchymal scarring. Unchanged heart size and mediastinal contours. Mild bronchial thickening and central vascular prominence. No confluent  consolidation. No pneumothorax or large pleural effusion. Chronic change of both shoulders. IMPRESSION: 1. Mild vascular congestion and bronchial thickening. 2. Stable volume loss and scarring in the left hemithorax. Electronically Signed   By: Jeb Levering M.D.   On: 02/05/2018 21:39        Scheduled Meds: . albuterol  5 mg Nebulization Once  . aspirin EC  81 mg Oral Daily  . atorvastatin  40 mg Oral QHS  . benzonatate  100 mg Oral Q8H  . budesonide (PULMICORT) nebulizer solution  0.5 mg Nebulization BID  . buPROPion  150 mg Oral BID  . carbidopa-levodopa  1 tablet Oral BID  . cholecalciferol  1,000 Units Oral Daily  . doxycycline  100 mg Oral BID  . enoxaparin (LOVENOX) injection  30 mg Subcutaneous Q24H  . gabapentin  600 mg Oral QHS  . guaiFENesin  600 mg Oral BID  . insulin aspart  0-15 Units Subcutaneous TID WC  . insulin aspart  0-5 Units Subcutaneous QHS  . insulin glargine  15 Units Subcutaneous QHS  .  ipratropium-albuterol  3 mL Nebulization Q4H  . linagliptin  5 mg Oral Daily  . loratadine  10 mg Oral Daily  . meclizine  25 mg Oral Daily  . methylPREDNISolone (SOLU-MEDROL) injection  60 mg Intravenous Q6H  . metoprolol succinate  25 mg Oral Daily  . omega-3 acid ethyl esters  1 g Oral Daily  . pantoprazole  40 mg Oral Daily  . potassium chloride  10 mEq Oral Daily  . pyridOXINE  100 mg Oral Daily  . rOPINIRole  0.5 mg Oral BID  . sodium chloride flush  3 mL Intravenous Q12H   Continuous Infusions: . sodium chloride       LOS: 1 day    Time spent: 20mins    Kathie Dike, MD [facility] Pager [phone]  If 7PM-7AM, please contact night-coverage [email or web address] Password [facility] 02/07/2018, 5:48 PM

## 2018-02-07 NOTE — Plan of Care (Signed)
progressing 

## 2018-02-08 DIAGNOSIS — I509 Heart failure, unspecified: Secondary | ICD-10-CM

## 2018-02-08 LAB — GLUCOSE, CAPILLARY
Glucose-Capillary: 158 mg/dL — ABNORMAL HIGH (ref 65–99)
Glucose-Capillary: 165 mg/dL — ABNORMAL HIGH (ref 65–99)

## 2018-02-08 LAB — RESPIRATORY PANEL BY PCR

## 2018-02-08 MED ORDER — IPRATROPIUM-ALBUTEROL 0.5-2.5 (3) MG/3ML IN SOLN: 3.0000 mL | RESPIRATORY_TRACT | Status: DC | PRN

## 2018-02-08 MED ORDER — PREDNISONE 10 MG PO TABS: ORAL_TABLET | ORAL | 0 refills | Status: DC

## 2018-02-08 MED ORDER — MOMETASONE FURO-FORMOTEROL FUM 200-5 MCG/ACT IN AERO: 2.0000 | INHALATION_SPRAY | Freq: Two times a day (BID) | RESPIRATORY_TRACT | 1 refills | Status: AC

## 2018-02-08 MED ORDER — IPRATROPIUM-ALBUTEROL 0.5-2.5 (3) MG/3ML IN SOLN
3.0000 mL | Freq: Three times a day (TID) | RESPIRATORY_TRACT | Status: DC
  Administered 2018-02-08: 3 mL via RESPIRATORY_TRACT
  Filled 2018-02-08: qty 3

## 2018-02-08 NOTE — Care Management Note (Signed)
Case Management Note  Patient Details  Name: Angela Lawson MRN: [id number] Date of Birth: [birth year]    Expected Discharge Date:  02/08/18               Expected Discharge Plan:  [place]  In-House Referral:     Discharge planning Services  CM Consult  Post Acute Care Choice:  Home Health Choice offered to:  Patient  DME Arranged:    DME Agency:     HH Arranged:  RN [place] Agency:  [place]  Status of Service:  Completed, signed off  If discussed at [place] of Stay Meetings, dates discussed:    Additional Comments: Discharging home today. From home with daughter. Walks with RW. Has home continuous oxygen ([employer]). Has had [facility] in the past. Agreeable to having [facility] RN again and being enrolled with [facility] emmi calls again. Has PCP-daughter drives her to appoinments. Juliann Pulse of [facility] notified and will obtain orders from [place]. Has portable tank for transport home.  [name], Chauncey Reading, RN 02/08/2018, 10:58 AM

## 2018-02-08 NOTE — Progress Notes (Signed)
Angela Lawson discharged Home per MD order.  Discharge instructions reviewed and discussed with the patient, all questions and concerns answered. Copy of instructions and scripts given to patient.  Allergies as of 02/08/2018      Reactions   Lisinopril Cough      Medication List    STOP taking these medications   azithromycin 250 MG tablet Commonly known as:  ZITHROMAX     TAKE these medications   alendronate 70 MG tablet Commonly known as:  FOSAMAX Take 70 mg by mouth every Friday.   aspirin EC 81 MG tablet Take 81 mg by mouth daily.   atorvastatin 40 MG tablet Commonly known as:  LIPITOR Take 40 mg by mouth at bedtime.   B-D ULTRAFINE III SHORT PEN 31G X 8 MM Misc Generic drug:  Insulin Pen Needle See admin instructions.   benzonatate 100 MG capsule Commonly known as:  TESSALON Take 1 capsule (100 mg total) by mouth every 8 (eight) hours.   buPROPion 150 MG 12 hr tablet Commonly known as:  WELLBUTRIN SR Take 150 mg by mouth 2 (two) times daily.   carbidopa-levodopa 25-100 MG tablet Commonly known as:  SINEMET IR Take 1 tablet by mouth 2 (two) times daily.   cholecalciferol 1000 units tablet Commonly known as:  VITAMIN D Take 1,000 Units by mouth daily.   clotrimazole-betamethasone cream Commonly known as:  LOTRISONE Apply 1 application topically 2 (two) times daily.   doxycycline 100 MG capsule Commonly known as:  VIBRAMYCIN Take 1 capsule (100 mg total) by mouth 2 (two) times daily.   FISH OIL PO Take 1 capsule by mouth daily.   furosemide 40 MG tablet Commonly known as:  LASIX Take 1 tablet (40 mg total) by mouth daily. Pt is able to take one additional tablet daily for weight increases of 3lbs in a day or 5lbs in a week. Resume on 10/20   gabapentin 300 MG capsule Commonly known as:  NEURONTIN Take 600 mg by mouth at bedtime.   Garlic 220 MG Tabs Take 300 mg by mouth daily.   guaiFENesin 600 MG 12 hr tablet Commonly known as:  MUCINEX Take 1  tablet (600 mg total) by mouth 2 (two) times daily.   ipratropium-albuterol 0.5-2.5 (3) MG/3ML Soln Commonly known as:  DUONEB Inhale 3 mLs into the lungs every 6 (six) hours as needed (for wheezing/shortness of breath).   KLOR-CON 10 10 MEQ tablet Generic drug:  potassium chloride TAKE 1 TABLET (10 MEQ TOTAL) BY MOUTH DAILY. *FURTHER REFILLS NEED TO BE AUTHORIZED BY PCP* What changed:  See the new instructions.   LANTUS SOLOSTAR 100 UNIT/ML Solostar Pen Generic drug:  Insulin Glargine Inject 15 Units into the skin at bedtime.   lidocaine-prilocaine cream Commonly known as:  EMLA Apply to affected area once What changed:    how much to take  how to take this  when to take this  reasons to take this  additional instructions   linagliptin 5 MG Tabs tablet Commonly known as:  TRADJENTA Take 5 mg by mouth daily.   loratadine 10 MG tablet Commonly known as:  CLARITIN Take 10 mg by mouth daily.   meclizine 25 MG tablet Commonly known as:  ANTIVERT Take 25 mg by mouth daily.   metoprolol succinate 25 MG 24 hr tablet Commonly known as:  TOPROL-XL Take 1 tablet (25 mg total) by mouth daily.   mometasone-formoterol 200-5 MCG/ACT Aero Commonly known as:  DULERA Inhale 2 puffs into  the lungs 2 (two) times daily.   nitroGLYCERIN 0.4 MG SL tablet Commonly known as:  NITROSTAT Place 0.4 mg under the tongue every 5 (five) minutes as needed for chest pain.   omeprazole 40 MG capsule Commonly known as:  PRILOSEC Take 40 mg by mouth daily.   ondansetron 8 MG tablet Commonly known as:  ZOFRAN Take 1 tablet (8 mg total) by mouth 2 (two) times daily as needed (Nausea or vomiting).   OXYGEN Inhale 3 L into the lungs continuous.   polyethylene glycol powder powder Commonly known as:  GLYCOLAX/MIRALAX Take 17 g by mouth daily as needed.   predniSONE 10 MG tablet Commonly known as:  DELTASONE Take 60mg  po daily for 2 days then 40mg  daily for 2 days then 30mg  daily for 2  days then 20mg  daily for 2 days then 10mg  daily for 2 days then stop What changed:    medication strength  how much to take  how to take this  when to take this  additional instructions   PROAIR HFA 108 (90 Base) MCG/ACT inhaler Generic drug:  albuterol Inhale 2 puffs into the lungs every 4 (four) hours as needed for wheezing or shortness of breath.   prochlorperazine 10 MG tablet Commonly known as:  COMPAZINE Take 1 tablet (10 mg total) by mouth every 6 (six) hours as needed (Nausea or vomiting).   pyridoxine 100 MG tablet Commonly known as:  B-6 Take 100 mg by mouth daily.   rOPINIRole 0.5 MG tablet Commonly known as:  REQUIP Take 0.5 mg by mouth 2 (two) times daily.   sodium chloride 0.65 % Soln nasal spray Commonly known as:  OCEAN Place 1 spray into both nostrils every 3 (three) hours as needed for congestion.   TECENTRIQ IV Inject into the vein. Every 3 weeks   traMADol 50 MG tablet Commonly known as:  ULTRAM Take 50 mg by mouth every 12 (twelve) hours as needed for moderate pain.   zolpidem 5 MG tablet Commonly known as:  AMBIEN Take 5 mg by mouth at bedtime as needed for sleep.       Patients skin is clean, dry and intact, no evidence of skin break down. IV site discontinued and catheter remains intact. Site without signs and symptoms of complications. Dressing and pressure applied.  Patient escorted to car by NT in a wheelchair,  no distress noted upon discharge.  Angela Lawson 02/08/2018 2:25 PM

## 2018-02-08 NOTE — Discharge Summary (Signed)
Physician Discharge Summary  DUBLIN CANTERO NLG:[id number] DOB: 01-01-1934 DOA: 02/05/2018  PCP: Rosita Fire, MD  Admit date: 02/05/2018 Discharge date: 02/08/2018  Admitted From: Home Disposition: Home  Recommendations for Outpatient Follow-up:  1. Follow up with PCP in 1-2 weeks 2. Please obtain BMP/CBC in one week 3. Follow-up with Dr. Luan Pulling arranged  Home Health: [facility] Equipment/Devices:  Discharge Condition: Stable CODE STATUS: DNR Diet recommendation: Heart Healthy / Carb Modified    Brief/Interim Summary: 84 year old female with a history of COPD and chronic respiratory failure on 3 L of oxygen, was admitted to the hospital with dyspnea on exertion felt to be related to COPD exacerbation.  Admitted for bronchodilators, intravenous steroids.    Discharge Diagnoses:  Principal Problem:   Acute on chronic respiratory failure with hypoxia (HCC) Active Problems:   Adenocarcinoma of lung (HCC)   CKD (chronic kidney disease) stage 3, GFR 30-59 ml/min (HCC)   Essential hypertension   Type II diabetes mellitus (HCC)   Chronic diastolic CHF (congestive heart failure) (HCC)   COPD with acute exacerbation (HCC)   Respiratory failure ([place])  1. Acute on chronic respiratory failure with hypoxia.  She is chronically on 3 L of oxygen.  Decompensation of respiratory status related to COPD exacerbation.    Patient was weaned down to her baseline oxygen requirement it is feeling better. 2. COPD exacerbation.    Treated with intravenous steroids, bronchodilators and antibiotics.  Overall respiratory status has improved.  Wheezing has resolved.  She is transition to prednisone taper.  At family's request, I discussed her case with Dr. Luan Pulling who is familiar with her care.  Her case was reviewed and he did not have any further recommendations.  He agreed with current management.  Dr. Luan Pulling office will contact family to arrange timely follow-up. 3. Acute on chronic diastolic congestive heart  failure, mild, patient received a dose of IV diuretics in the emergency room.  She was continued on her home dose of Lasix.  Appears to be euvolemic.Marland Kitchen 4. Elevated troponin.  Asymptomatic.  Echocardiogram unremarkable.  Likely related to stress from respiratory decompensation. 5. Diabetes.  Continue on Tradjenta and Lantus.    Blood sugars remained stable 6. Hypertension.  Blood pressure currently stable.  Continue on Toprol 7. Chronic kidney disease stage III.  Creatinine is currently at baseline.  Continue to monitor. 8. Non-small cell lung cancer.  Last chemotherapy in December.  Continue to follow with cancer Center  Discharge Instructions  Discharge Instructions    Diet - low sodium heart healthy   Complete by:  As directed    Face-to-face encounter (required for Medicare/Medicaid patients)   Complete by:  As directed    I Kathie Dike certify that this patient is under my care and that I, or a nurse practitioner or physician's assistant working with me, had a face-to-face encounter that meets the physician face-to-face encounter requirements with this patient on 02/08/2018. The encounter with the patient was in whole, or in part for the following medical condition(s) which is the primary reason for home health care (List medical condition): copd exacerbation   The encounter with the patient was in whole, or in part, for the following medical condition, which is the primary reason for home health care:  copd exacerbation   I certify that, based on my findings, the following services are medically necessary home health services:  Nursing   Reason for Medically Necessary Home Health Services:  Skilled Nursing- Teaching of Disease Process/Symptom Management  My clinical findings support the need for the above services:  Shortness of breath with activity   Further, I certify that my clinical findings support that this patient is homebound due to:  Shortness of Breath with activity   Home Health    Complete by:  As directed    To provide the following care/treatments:  RN   Increase activity slowly   Complete by:  As directed      Allergies as of 02/08/2018      Reactions   Lisinopril Cough      Medication List    STOP taking these medications   azithromycin 250 MG tablet Commonly known as:  ZITHROMAX     TAKE these medications   alendronate 70 MG tablet Commonly known as:  FOSAMAX Take 70 mg by mouth every Friday.   aspirin EC 81 MG tablet Take 81 mg by mouth daily.   atorvastatin 40 MG tablet Commonly known as:  LIPITOR Take 40 mg by mouth at bedtime.   B-D ULTRAFINE III SHORT PEN 31G X 8 MM Misc Generic drug:  Insulin Pen Needle See admin instructions.   benzonatate 100 MG capsule Commonly known as:  TESSALON Take 1 capsule (100 mg total) by mouth every 8 (eight) hours.   buPROPion 150 MG 12 hr tablet Commonly known as:  WELLBUTRIN SR Take 150 mg by mouth 2 (two) times daily.   carbidopa-levodopa 25-100 MG tablet Commonly known as:  SINEMET IR Take 1 tablet by mouth 2 (two) times daily.   cholecalciferol 1000 units tablet Commonly known as:  VITAMIN D Take 1,000 Units by mouth daily.   clotrimazole-betamethasone cream Commonly known as:  LOTRISONE Apply 1 application topically 2 (two) times daily.   doxycycline 100 MG capsule Commonly known as:  VIBRAMYCIN Take 1 capsule (100 mg total) by mouth 2 (two) times daily.   FISH OIL PO Take 1 capsule by mouth daily.   furosemide 40 MG tablet Commonly known as:  LASIX Take 1 tablet (40 mg total) by mouth daily. Pt is able to take one additional tablet daily for weight increases of 3lbs in a day or 5lbs in a week. Resume on 10/20   gabapentin 300 MG capsule Commonly known as:  NEURONTIN Take 600 mg by mouth at bedtime.   Garlic 956 MG Tabs Take 300 mg by mouth daily.   guaiFENesin 600 MG 12 hr tablet Commonly known as:  MUCINEX Take 1 tablet (600 mg total) by mouth 2 (two) times daily.    ipratropium-albuterol 0.5-2.5 (3) MG/3ML Soln Commonly known as:  DUONEB Inhale 3 mLs into the lungs every 6 (six) hours as needed (for wheezing/shortness of breath).   KLOR-CON 10 10 MEQ tablet Generic drug:  potassium chloride TAKE 1 TABLET (10 MEQ TOTAL) BY MOUTH DAILY. *FURTHER REFILLS NEED TO BE AUTHORIZED BY PCP* What changed:  See the new instructions.   LANTUS SOLOSTAR 100 UNIT/ML Solostar Pen Generic drug:  Insulin Glargine Inject 15 Units into the skin at bedtime.   lidocaine-prilocaine cream Commonly known as:  EMLA Apply to affected area once What changed:    how much to take  how to take this  when to take this  reasons to take this  additional instructions   linagliptin 5 MG Tabs tablet Commonly known as:  TRADJENTA Take 5 mg by mouth daily.   loratadine 10 MG tablet Commonly known as:  CLARITIN Take 10 mg by mouth daily.   meclizine 25 MG tablet Commonly known  as:  ANTIVERT Take 25 mg by mouth daily.   metoprolol succinate 25 MG 24 hr tablet Commonly known as:  TOPROL-XL Take 1 tablet (25 mg total) by mouth daily.   mometasone-formoterol 200-5 MCG/ACT Aero Commonly known as:  DULERA Inhale 2 puffs into the lungs 2 (two) times daily.   nitroGLYCERIN 0.4 MG SL tablet Commonly known as:  NITROSTAT Place 0.4 mg under the tongue every 5 (five) minutes as needed for chest pain.   omeprazole 40 MG capsule Commonly known as:  PRILOSEC Take 40 mg by mouth daily.   ondansetron 8 MG tablet Commonly known as:  ZOFRAN Take 1 tablet (8 mg total) by mouth 2 (two) times daily as needed (Nausea or vomiting).   OXYGEN Inhale 3 L into the lungs continuous.   polyethylene glycol powder powder Commonly known as:  GLYCOLAX/MIRALAX Take 17 g by mouth daily as needed.   predniSONE 10 MG tablet Commonly known as:  DELTASONE Take 60mg  po daily for 2 days then 40mg  daily for 2 days then 30mg  daily for 2 days then 20mg  daily for 2 days then 10mg  daily for 2  days then stop What changed:    medication strength  how much to take  how to take this  when to take this  additional instructions   PROAIR HFA 108 (90 Base) MCG/ACT inhaler Generic drug:  albuterol Inhale 2 puffs into the lungs every 4 (four) hours as needed for wheezing or shortness of breath.   prochlorperazine 10 MG tablet Commonly known as:  COMPAZINE Take 1 tablet (10 mg total) by mouth every 6 (six) hours as needed (Nausea or vomiting).   pyridoxine 100 MG tablet Commonly known as:  B-6 Take 100 mg by mouth daily.   rOPINIRole 0.5 MG tablet Commonly known as:  REQUIP Take 0.5 mg by mouth 2 (two) times daily.   sodium chloride 0.65 % Soln nasal spray Commonly known as:  OCEAN Place 1 spray into both nostrils every 3 (three) hours as needed for congestion.   TECENTRIQ IV Inject into the vein. Every 3 weeks   traMADol 50 MG tablet Commonly known as:  ULTRAM Take 50 mg by mouth every 12 (twelve) hours as needed for moderate pain.   zolpidem 5 MG tablet Commonly known as:  AMBIEN Take 5 mg by mouth at bedtime as needed for sleep.       Allergies  Allergen Reactions  . Lisinopril Cough    Consultations:     Procedures/Studies: Dg Chest 2 View  Result Date: 02/04/2018 CLINICAL DATA:  Stage IV lung cancer.  Wheezing. EXAM: CHEST  2 VIEW COMPARISON:  Radiographs 12/03/2017 and prior FINDINGS: Cardiomediastinal silhouette is unchanged. Continued LEFT LOWER lung opacity again noted and unchanged. A RIGHT IJ Port-A-Cath is present with tip overlying the LOWER SVC. There is no evidence of new pulmonary opacity, pleural effusion or pneumothorax. IMPRESSION: Cardiomegaly without evidence of acute cardiopulmonary disease. Electronically Signed   By: Margarette Canada M.D.   On: 02/04/2018 14:32   Dg Chest Portable 1 View  Result Date: 02/05/2018 CLINICAL DATA:  Increasing shortness of breath. EXAM: PORTABLE CHEST 1 VIEW COMPARISON:  Frontal and lateral views  yesterday. Most recent chest CT 10/08/2017 FINDINGS: Tip of the right chest port remains in place in the mid SVC. Unchanged volume loss in the left hemithorax with basilar pleuroparenchymal scarring. Unchanged heart size and mediastinal contours. Mild bronchial thickening and central vascular prominence. No confluent consolidation. No pneumothorax or large pleural effusion.  Chronic change of both shoulders. IMPRESSION: 1. Mild vascular congestion and bronchial thickening. 2. Stable volume loss and scarring in the left hemithorax. Electronically Signed   By: Jeb Levering M.D.   On: 02/05/2018 21:39    Echo: - Normal LV systolic function; mild diastolic dysfunction.   Subjective: Feeling better. Feels approaching baseline. Shortness of breath is better  Discharge Exam: Vitals:   02/08/18 0733 02/08/18 0737  BP:    Pulse:    Resp:    Temp:    SpO2: 98% 98%   Vitals:   02/07/18 2309 02/08/18 0502 02/08/18 0733 02/08/18 0737  BP:  (!) 164/73    Pulse:  73    Resp:  18    Temp:  98.3 F (36.8 C)    TempSrc:  Oral    SpO2: 97% 99% 98% 98%  Weight:    79.8 kg (175 lb 14.8 oz)  Height:        General: Pt is alert, awake, not in acute distress Cardiovascular: RRR, S1/S2 +, no rubs, no gallops Respiratory: wheezing has improved Abdominal: Soft, NT, ND, bowel sounds + Extremities: no edema, no cyanosis    The results of significant diagnostics from this hospitalization (including imaging, microbiology, ancillary and laboratory) are listed below for reference.     Microbiology: No results found for this or any previous visit (from the past 240 hour(s)).   Labs: BNP (last 3 results) Recent Labs    06/22/17 2331 02/04/18 1610 02/05/18 2211  BNP 43.0 70.0 332.9*   Basic Metabolic Panel: Recent Labs  Lab 02/04/18 1019 02/05/18 2211 02/07/18 0619  NA 137 138 139  K 4.1 5.0 4.8  CL 100* 101 105  CO2 25 23 27   GLUCOSE 148* 147* 190*  BUN 17 32* 36*  CREATININE 1.34*  1.48* 1.33*  CALCIUM 10.0 10.8* 9.9   Liver Function Tests: Recent Labs  Lab 02/04/18 1019 02/05/18 2211  AST 28 30  ALT 6* 25  ALKPHOS 52 50  BILITOT 0.5 0.5  PROT 7.1 7.4  ALBUMIN 3.8 4.0   No results for input(s): LIPASE, AMYLASE in the last 168 hours. No results for input(s): AMMONIA in the last 168 hours. CBC: Recent Labs  Lab 02/04/18 1019 02/05/18 2211 02/07/18 0619  WBC 9.0 15.7* 16.3*  NEUTROABS 5.8 13.7*  --   HGB 11.5* 11.5* 11.3*  HCT 38.5 37.9 37.1  MCV 95.1 93.3 93.5  PLT 244 258 263   Cardiac Enzymes: Recent Labs  Lab 02/04/18 1610 02/06/18 0137 02/06/18 0730 02/06/18 1238  TROPONINI 0.06* 0.07* 0.07* 0.07*   BNP: Invalid input(s): POCBNP CBG: Recent Labs  Lab 02/07/18 1132 02/07/18 1615 02/07/18 2048 02/08/18 0744 02/08/18 1142  GLUCAP 133* 244* 184* 158* 165*   D-Dimer No results for input(s): DDIMER in the last 72 hours. Hgb A1c No results for input(s): HGBA1C in the last 72 hours. Lipid Profile No results for input(s): CHOL, HDL, LDLCALC, TRIG, CHOLHDL, LDLDIRECT in the last 72 hours. Thyroid function studies No results for input(s): TSH, T4TOTAL, T3FREE, THYROIDAB in the last 72 hours.  Invalid input(s): FREET3 Anemia work up No results for input(s): VITAMINB12, FOLATE, FERRITIN, TIBC, IRON, RETICCTPCT in the last 72 hours. Urinalysis    Component Value Date/Time   COLORURINE YELLOW 04/26/2017 2112   APPEARANCEUR CLEAR 04/26/2017 2112   LABSPEC 1.008 04/26/2017 2112   PHURINE 6.0 04/26/2017 2112   GLUCOSEU NEGATIVE 04/26/2017 2112   HGBUR NEGATIVE 04/26/2017 2112   [place] NEGATIVE 04/26/2017 2112  KETONESUR NEGATIVE 04/26/2017 2112   PROTEINUR NEGATIVE 04/26/2017 2112   UROBILINOGEN 0.2 04/19/2012 1411   NITRITE NEGATIVE 04/26/2017 2112   LEUKOCYTESUR TRACE (A) 04/26/2017 2112   Sepsis Labs Invalid input(s): PROCALCITONIN,  WBC,  LACTICIDVEN Microbiology No results found for this or any previous visit (from  the past 240 hour(s)).   Time coordinating discharge: Over 30 minutes  SIGNED:   Kathie Dike, MD  [facility] 02/08/2018, 6:43 PM Pager   If 7PM-7AM, please contact night-coverage [email or web address] Password [email or web address]

## 2018-02-08 NOTE — Care Management Important Message (Signed)
Important Message  Patient Details  Name: Angela Lawson MRN: [id number] Date of Birth: 1933-12-19   Medicare Important Message Given:  Yes    Lemuel Boodram, Chauncey Reading, RN 02/08/2018, 11:05 AM

## 2018-02-09 DIAGNOSIS — M1991 Primary osteoarthritis, unspecified site: Secondary | ICD-10-CM | POA: Diagnosis not present

## 2018-02-09 DIAGNOSIS — J449 Chronic obstructive pulmonary disease, unspecified: Secondary | ICD-10-CM | POA: Diagnosis not present

## 2018-02-09 DIAGNOSIS — M810 Age-related osteoporosis without current pathological fracture: Secondary | ICD-10-CM | POA: Diagnosis not present

## 2018-02-09 DIAGNOSIS — Z7951 Long term (current) use of inhaled steroids: Secondary | ICD-10-CM | POA: Diagnosis not present

## 2018-02-09 DIAGNOSIS — E1122 Type 2 diabetes mellitus with diabetic chronic kidney disease: Secondary | ICD-10-CM | POA: Diagnosis not present

## 2018-02-09 DIAGNOSIS — E785 Hyperlipidemia, unspecified: Secondary | ICD-10-CM | POA: Diagnosis not present

## 2018-02-09 DIAGNOSIS — Z7982 Long term (current) use of aspirin: Secondary | ICD-10-CM | POA: Diagnosis not present

## 2018-02-09 DIAGNOSIS — I130 Hypertensive heart and chronic kidney disease with heart failure and stage 1 through stage 4 chronic kidney disease, or unspecified chronic kidney disease: Secondary | ICD-10-CM | POA: Diagnosis not present

## 2018-02-09 DIAGNOSIS — G20 Parkinson's disease: Secondary | ICD-10-CM | POA: Diagnosis not present

## 2018-02-09 DIAGNOSIS — C3491 Malignant neoplasm of unspecified part of right bronchus or lung: Secondary | ICD-10-CM | POA: Diagnosis not present

## 2018-02-09 DIAGNOSIS — N183 Chronic kidney disease, stage 3 (moderate): Secondary | ICD-10-CM | POA: Diagnosis not present

## 2018-02-09 DIAGNOSIS — I5032 Chronic diastolic (congestive) heart failure: Secondary | ICD-10-CM | POA: Diagnosis not present

## 2018-02-09 DIAGNOSIS — Z794 Long term (current) use of insulin: Secondary | ICD-10-CM | POA: Diagnosis not present

## 2018-02-11 DIAGNOSIS — M810 Age-related osteoporosis without current pathological fracture: Secondary | ICD-10-CM | POA: Diagnosis not present

## 2018-02-11 DIAGNOSIS — M1991 Primary osteoarthritis, unspecified site: Secondary | ICD-10-CM | POA: Diagnosis not present

## 2018-02-11 DIAGNOSIS — J449 Chronic obstructive pulmonary disease, unspecified: Secondary | ICD-10-CM | POA: Diagnosis not present

## 2018-02-11 DIAGNOSIS — Z7951 Long term (current) use of inhaled steroids: Secondary | ICD-10-CM | POA: Diagnosis not present

## 2018-02-11 DIAGNOSIS — Z794 Long term (current) use of insulin: Secondary | ICD-10-CM | POA: Diagnosis not present

## 2018-02-11 DIAGNOSIS — C3491 Malignant neoplasm of unspecified part of right bronchus or lung: Secondary | ICD-10-CM | POA: Diagnosis not present

## 2018-02-11 DIAGNOSIS — I5032 Chronic diastolic (congestive) heart failure: Secondary | ICD-10-CM | POA: Diagnosis not present

## 2018-02-11 DIAGNOSIS — E785 Hyperlipidemia, unspecified: Secondary | ICD-10-CM | POA: Diagnosis not present

## 2018-02-11 DIAGNOSIS — G20 Parkinson's disease: Secondary | ICD-10-CM | POA: Diagnosis not present

## 2018-02-11 DIAGNOSIS — E1122 Type 2 diabetes mellitus with diabetic chronic kidney disease: Secondary | ICD-10-CM | POA: Diagnosis not present

## 2018-02-11 DIAGNOSIS — I130 Hypertensive heart and chronic kidney disease with heart failure and stage 1 through stage 4 chronic kidney disease, or unspecified chronic kidney disease: Secondary | ICD-10-CM | POA: Diagnosis not present

## 2018-02-11 DIAGNOSIS — Z7982 Long term (current) use of aspirin: Secondary | ICD-10-CM | POA: Diagnosis not present

## 2018-02-11 DIAGNOSIS — N183 Chronic kidney disease, stage 3 (moderate): Secondary | ICD-10-CM | POA: Diagnosis not present

## 2018-02-15 DIAGNOSIS — J9621 Acute and chronic respiratory failure with hypoxia: Secondary | ICD-10-CM | POA: Diagnosis not present

## 2018-02-15 DIAGNOSIS — J441 Chronic obstructive pulmonary disease with (acute) exacerbation: Secondary | ICD-10-CM | POA: Diagnosis not present

## 2018-02-15 DIAGNOSIS — C3492 Malignant neoplasm of unspecified part of left bronchus or lung: Secondary | ICD-10-CM | POA: Diagnosis not present

## 2018-02-15 DIAGNOSIS — E1165 Type 2 diabetes mellitus with hyperglycemia: Secondary | ICD-10-CM | POA: Diagnosis not present

## 2018-02-15 DIAGNOSIS — N183 Chronic kidney disease, stage 3 (moderate): Secondary | ICD-10-CM | POA: Diagnosis not present

## 2018-02-16 DIAGNOSIS — E785 Hyperlipidemia, unspecified: Secondary | ICD-10-CM | POA: Diagnosis not present

## 2018-02-16 DIAGNOSIS — I130 Hypertensive heart and chronic kidney disease with heart failure and stage 1 through stage 4 chronic kidney disease, or unspecified chronic kidney disease: Secondary | ICD-10-CM | POA: Diagnosis not present

## 2018-02-16 DIAGNOSIS — Z7951 Long term (current) use of inhaled steroids: Secondary | ICD-10-CM | POA: Diagnosis not present

## 2018-02-16 DIAGNOSIS — G20 Parkinson's disease: Secondary | ICD-10-CM | POA: Diagnosis not present

## 2018-02-16 DIAGNOSIS — M1991 Primary osteoarthritis, unspecified site: Secondary | ICD-10-CM | POA: Diagnosis not present

## 2018-02-16 DIAGNOSIS — E1122 Type 2 diabetes mellitus with diabetic chronic kidney disease: Secondary | ICD-10-CM | POA: Diagnosis not present

## 2018-02-16 DIAGNOSIS — Z794 Long term (current) use of insulin: Secondary | ICD-10-CM | POA: Diagnosis not present

## 2018-02-16 DIAGNOSIS — M810 Age-related osteoporosis without current pathological fracture: Secondary | ICD-10-CM | POA: Diagnosis not present

## 2018-02-16 DIAGNOSIS — C3491 Malignant neoplasm of unspecified part of right bronchus or lung: Secondary | ICD-10-CM | POA: Diagnosis not present

## 2018-02-16 DIAGNOSIS — N183 Chronic kidney disease, stage 3 (moderate): Secondary | ICD-10-CM | POA: Diagnosis not present

## 2018-02-16 DIAGNOSIS — Z7982 Long term (current) use of aspirin: Secondary | ICD-10-CM | POA: Diagnosis not present

## 2018-02-16 DIAGNOSIS — I5032 Chronic diastolic (congestive) heart failure: Secondary | ICD-10-CM | POA: Diagnosis not present

## 2018-02-16 DIAGNOSIS — J449 Chronic obstructive pulmonary disease, unspecified: Secondary | ICD-10-CM | POA: Diagnosis not present

## 2018-02-19 DIAGNOSIS — N183 Chronic kidney disease, stage 3 (moderate): Secondary | ICD-10-CM | POA: Diagnosis not present

## 2018-02-19 DIAGNOSIS — M810 Age-related osteoporosis without current pathological fracture: Secondary | ICD-10-CM | POA: Diagnosis not present

## 2018-02-19 DIAGNOSIS — M1991 Primary osteoarthritis, unspecified site: Secondary | ICD-10-CM | POA: Diagnosis not present

## 2018-02-19 DIAGNOSIS — Z7951 Long term (current) use of inhaled steroids: Secondary | ICD-10-CM | POA: Diagnosis not present

## 2018-02-19 DIAGNOSIS — E785 Hyperlipidemia, unspecified: Secondary | ICD-10-CM | POA: Diagnosis not present

## 2018-02-19 DIAGNOSIS — I5032 Chronic diastolic (congestive) heart failure: Secondary | ICD-10-CM | POA: Diagnosis not present

## 2018-02-19 DIAGNOSIS — Z7982 Long term (current) use of aspirin: Secondary | ICD-10-CM | POA: Diagnosis not present

## 2018-02-19 DIAGNOSIS — E1122 Type 2 diabetes mellitus with diabetic chronic kidney disease: Secondary | ICD-10-CM | POA: Diagnosis not present

## 2018-02-19 DIAGNOSIS — I130 Hypertensive heart and chronic kidney disease with heart failure and stage 1 through stage 4 chronic kidney disease, or unspecified chronic kidney disease: Secondary | ICD-10-CM | POA: Diagnosis not present

## 2018-02-19 DIAGNOSIS — J449 Chronic obstructive pulmonary disease, unspecified: Secondary | ICD-10-CM | POA: Diagnosis not present

## 2018-02-19 DIAGNOSIS — G20 Parkinson's disease: Secondary | ICD-10-CM | POA: Diagnosis not present

## 2018-02-19 DIAGNOSIS — C3491 Malignant neoplasm of unspecified part of right bronchus or lung: Secondary | ICD-10-CM | POA: Diagnosis not present

## 2018-02-19 DIAGNOSIS — Z794 Long term (current) use of insulin: Secondary | ICD-10-CM | POA: Diagnosis not present

## 2018-02-22 DIAGNOSIS — J449 Chronic obstructive pulmonary disease, unspecified: Secondary | ICD-10-CM | POA: Diagnosis not present

## 2018-02-23 DIAGNOSIS — I5032 Chronic diastolic (congestive) heart failure: Secondary | ICD-10-CM | POA: Diagnosis not present

## 2018-02-23 DIAGNOSIS — I130 Hypertensive heart and chronic kidney disease with heart failure and stage 1 through stage 4 chronic kidney disease, or unspecified chronic kidney disease: Secondary | ICD-10-CM | POA: Diagnosis not present

## 2018-02-23 DIAGNOSIS — J449 Chronic obstructive pulmonary disease, unspecified: Secondary | ICD-10-CM | POA: Diagnosis not present

## 2018-02-23 DIAGNOSIS — N183 Chronic kidney disease, stage 3 (moderate): Secondary | ICD-10-CM | POA: Diagnosis not present

## 2018-02-23 DIAGNOSIS — Z794 Long term (current) use of insulin: Secondary | ICD-10-CM | POA: Diagnosis not present

## 2018-02-23 DIAGNOSIS — M1991 Primary osteoarthritis, unspecified site: Secondary | ICD-10-CM | POA: Diagnosis not present

## 2018-02-23 DIAGNOSIS — Z7982 Long term (current) use of aspirin: Secondary | ICD-10-CM | POA: Diagnosis not present

## 2018-02-23 DIAGNOSIS — E1122 Type 2 diabetes mellitus with diabetic chronic kidney disease: Secondary | ICD-10-CM | POA: Diagnosis not present

## 2018-02-23 DIAGNOSIS — G20 Parkinson's disease: Secondary | ICD-10-CM | POA: Diagnosis not present

## 2018-02-23 DIAGNOSIS — C3491 Malignant neoplasm of unspecified part of right bronchus or lung: Secondary | ICD-10-CM | POA: Diagnosis not present

## 2018-02-23 DIAGNOSIS — Z7951 Long term (current) use of inhaled steroids: Secondary | ICD-10-CM | POA: Diagnosis not present

## 2018-02-23 DIAGNOSIS — E785 Hyperlipidemia, unspecified: Secondary | ICD-10-CM | POA: Diagnosis not present

## 2018-02-23 DIAGNOSIS — M810 Age-related osteoporosis without current pathological fracture: Secondary | ICD-10-CM | POA: Diagnosis not present

## 2018-02-24 DIAGNOSIS — Z85118 Personal history of other malignant neoplasm of bronchus and lung: Secondary | ICD-10-CM | POA: Diagnosis not present

## 2018-02-24 DIAGNOSIS — J441 Chronic obstructive pulmonary disease with (acute) exacerbation: Secondary | ICD-10-CM | POA: Diagnosis not present

## 2018-02-24 DIAGNOSIS — J9611 Chronic respiratory failure with hypoxia: Secondary | ICD-10-CM | POA: Diagnosis not present

## 2018-02-24 DIAGNOSIS — I10 Essential (primary) hypertension: Secondary | ICD-10-CM | POA: Diagnosis not present

## 2018-02-25 ENCOUNTER — Encounter (HOSPITAL_COMMUNITY): Payer: Self-pay | Admitting: Hematology

## 2018-02-25 ENCOUNTER — Inpatient Hospital Stay (HOSPITAL_COMMUNITY): Payer: Medicare Other | Attending: Oncology | Admitting: Hematology

## 2018-02-25 ENCOUNTER — Inpatient Hospital Stay (HOSPITAL_COMMUNITY): Payer: Medicare Other

## 2018-02-25 ENCOUNTER — Encounter (HOSPITAL_COMMUNITY): Payer: Self-pay

## 2018-02-25 ENCOUNTER — Other Ambulatory Visit: Payer: Self-pay

## 2018-02-25 VITALS — BP 128/40 | HR 77 | Temp 96.7°F | Resp 20 | Wt 177.6 lb

## 2018-02-25 DIAGNOSIS — Z5112 Encounter for antineoplastic immunotherapy: Secondary | ICD-10-CM | POA: Diagnosis not present

## 2018-02-25 DIAGNOSIS — C3490 Malignant neoplasm of unspecified part of unspecified bronchus or lung: Secondary | ICD-10-CM

## 2018-02-25 DIAGNOSIS — M810 Age-related osteoporosis without current pathological fracture: Secondary | ICD-10-CM | POA: Diagnosis not present

## 2018-02-25 DIAGNOSIS — Z79899 Other long term (current) drug therapy: Secondary | ICD-10-CM | POA: Insufficient documentation

## 2018-02-25 DIAGNOSIS — E119 Type 2 diabetes mellitus without complications: Secondary | ICD-10-CM

## 2018-02-25 DIAGNOSIS — C3412 Malignant neoplasm of upper lobe, left bronchus or lung: Secondary | ICD-10-CM | POA: Diagnosis not present

## 2018-02-25 DIAGNOSIS — G620 Drug-induced polyneuropathy: Secondary | ICD-10-CM

## 2018-02-25 DIAGNOSIS — Z87891 Personal history of nicotine dependence: Secondary | ICD-10-CM | POA: Insufficient documentation

## 2018-02-25 DIAGNOSIS — K5730 Diverticulosis of large intestine without perforation or abscess without bleeding: Secondary | ICD-10-CM | POA: Diagnosis not present

## 2018-02-25 DIAGNOSIS — I509 Heart failure, unspecified: Secondary | ICD-10-CM | POA: Diagnosis not present

## 2018-02-25 DIAGNOSIS — G20 Parkinson's disease: Secondary | ICD-10-CM | POA: Diagnosis not present

## 2018-02-25 DIAGNOSIS — I700 Atherosclerosis of aorta: Secondary | ICD-10-CM | POA: Diagnosis not present

## 2018-02-25 DIAGNOSIS — Z7189 Other specified counseling: Secondary | ICD-10-CM

## 2018-02-25 DIAGNOSIS — Z7952 Long term (current) use of systemic steroids: Secondary | ICD-10-CM | POA: Insufficient documentation

## 2018-02-25 DIAGNOSIS — Z794 Long term (current) use of insulin: Secondary | ICD-10-CM | POA: Insufficient documentation

## 2018-02-25 DIAGNOSIS — E785 Hyperlipidemia, unspecified: Secondary | ICD-10-CM | POA: Insufficient documentation

## 2018-02-25 DIAGNOSIS — I252 Old myocardial infarction: Secondary | ICD-10-CM | POA: Insufficient documentation

## 2018-02-25 DIAGNOSIS — Z902 Acquired absence of lung [part of]: Secondary | ICD-10-CM | POA: Diagnosis not present

## 2018-02-25 DIAGNOSIS — R590 Localized enlarged lymph nodes: Secondary | ICD-10-CM | POA: Diagnosis not present

## 2018-02-25 DIAGNOSIS — J449 Chronic obstructive pulmonary disease, unspecified: Secondary | ICD-10-CM | POA: Insufficient documentation

## 2018-02-25 DIAGNOSIS — Z9981 Dependence on supplemental oxygen: Secondary | ICD-10-CM | POA: Insufficient documentation

## 2018-02-25 DIAGNOSIS — R600 Localized edema: Secondary | ICD-10-CM | POA: Diagnosis not present

## 2018-02-25 DIAGNOSIS — Z888 Allergy status to other drugs, medicaments and biological substances status: Secondary | ICD-10-CM | POA: Diagnosis not present

## 2018-02-25 DIAGNOSIS — Z7982 Long term (current) use of aspirin: Secondary | ICD-10-CM | POA: Insufficient documentation

## 2018-02-25 DIAGNOSIS — I110 Hypertensive heart disease with heart failure: Secondary | ICD-10-CM | POA: Insufficient documentation

## 2018-02-25 LAB — COMPREHENSIVE METABOLIC PANEL
ALT: 5 U/L — ABNORMAL LOW (ref 14–54)
AST: 21 U/L (ref 15–41)
Albumin: 3.3 g/dL — ABNORMAL LOW (ref 3.5–5.0)
Alkaline Phosphatase: 41 U/L (ref 38–126)
Anion gap: 11 (ref 5–15)
BUN: 16 mg/dL (ref 6–20)
CO2: 26 mmol/L (ref 22–32)
Calcium: 8.9 mg/dL (ref 8.9–10.3)
Chloride: 99 mmol/L — ABNORMAL LOW (ref 101–111)
Creatinine, Ser: 1.31 mg/dL — ABNORMAL HIGH (ref 0.44–1.00)
GFR calc Af Amer: 43 mL/min — ABNORMAL LOW (ref >60)
GFR calc non Af Amer: 37 mL/min — ABNORMAL LOW (ref >60)
Glucose, Bld: 153 mg/dL — ABNORMAL HIGH (ref 65–99)
Potassium: 4.1 mmol/L (ref 3.5–5.1)
Sodium: 136 mmol/L (ref 135–145)
Total Bilirubin: 0.8 mg/dL (ref 0.3–1.2)
Total Protein: 6.2 g/dL — ABNORMAL LOW (ref 6.5–8.1)

## 2018-02-25 LAB — CBC WITH DIFFERENTIAL/PLATELET
Basophils Absolute: 0 10*3/uL (ref 0.0–0.1)
Basophils Relative: 0 %
Eosinophils Absolute: 0.1 10*3/uL (ref 0.0–0.7)
Eosinophils Relative: 1 %
HCT: 38.1 % (ref 36.0–46.0)
Hemoglobin: 11.4 g/dL — ABNORMAL LOW (ref 12.0–15.0)
Lymphocytes Relative: 16 %
Lymphs Abs: 1.5 10*3/uL (ref 0.7–4.0)
MCH: 28.1 pg (ref 26.0–34.0)
MCHC: 29.9 g/dL — ABNORMAL LOW (ref 30.0–36.0)
MCV: 94.1 fL (ref 78.0–100.0)
Monocytes Absolute: 0.7 10*3/uL (ref 0.1–1.0)
Monocytes Relative: 7 %
Neutro Abs: 7.4 10*3/uL (ref 1.7–7.7)
Neutrophils Relative %: 76 %
Platelets: 142 10*3/uL — ABNORMAL LOW (ref 150–400)
RBC: 4.05 MIL/uL (ref 3.87–5.11)
RDW: 16.7 % — ABNORMAL HIGH (ref 11.5–15.5)
WBC: 9.7 10*3/uL (ref 4.0–10.5)

## 2018-02-25 MED ORDER — SODIUM CHLORIDE 0.9 % IV SOLN
1200.0000 mg | Freq: Once | INTRAVENOUS | Status: AC
  Administered 2018-02-25: 1200 mg via INTRAVENOUS
  Filled 2018-02-25: qty 20

## 2018-02-25 MED ORDER — SODIUM CHLORIDE 0.9% FLUSH
10.0000 mL | INTRAVENOUS | Status: DC | PRN
  Administered 2018-02-25: 10 mL
  Filled 2018-02-25: qty 10

## 2018-02-25 MED ORDER — SODIUM CHLORIDE 0.9 % IV SOLN
Freq: Once | INTRAVENOUS | Status: AC
  Administered 2018-02-25: 11:00:00 via INTRAVENOUS

## 2018-02-25 MED ORDER — HEPARIN SOD (PORK) LOCK FLUSH 100 UNIT/ML IV SOLN
500.0000 [IU] | Freq: Once | INTRAVENOUS | Status: AC | PRN
  Administered 2018-02-25: 500 [IU]

## 2018-02-25 NOTE — Assessment & Plan Note (Signed)
1.Stage IV adenocarcinoma of the lung: She has bilateral lung nodules and mediastinal adenopathy.  She is currently on Tecentriq which she is tolerating very well.  She does not have any immunotherapy related side effects.  Her functional status is also stable.  She is able to do all her activities.  She will proceed with her next cycle of Tecentriq today.  I plan to do restaging scans prior to next visit in 3 weeks.  2.  COPD: She had a recent admission to the hospital with COPD exacerbation from 3/1 through 02/08/2018.  She continues to be on 3 L nasal cannula.  Her breathing status has improved since the discharge.  3.  Diabetes: She will continue Tradjenta and Lantus.  4.  Parkinson's disease: She is continuing Sinemet.  This is well controlled.

## 2018-02-25 NOTE — Progress Notes (Signed)
1030 Labs reviewed with and pt seen by Dr. Delton Coombes and pt approved for Tecentriq infusion today per MD                                                                  Angela Lawson tolerated Tecentriq infusion well without complaints or incident. VSS upon discharge. Pt discharged via wheelchair in satisfactory condition accompanied by her daughter

## 2018-02-25 NOTE — Patient Instructions (Signed)
[facility] Discharge Instructions for Patients Receiving Chemotherapy   Beginning January 23rd 2017 lab work for the [facility] will be done in the  Main lab at [facility] on 1st floor. If you have a lab appointment with the [facility] please come in thru the  Main Entrance and check in at the main information desk   Today you received the following chemotherapy agents Tecentriq. Follow-up as scheduled. Call clinic for any questions or concerns  To help prevent nausea and vomiting after your treatment, we encourage you to take your nausea medication   If you develop nausea and vomiting, or diarrhea that is not controlled by your medication, call the clinic.  The clinic phone number is [phone]. Office hours are Monday-Friday 8:30am-5:00pm.  BELOW ARE SYMPTOMS THAT SHOULD BE REPORTED IMMEDIATELY:  *FEVER GREATER THAN 101.0 F  *CHILLS WITH OR WITHOUT FEVER  NAUSEA AND VOMITING THAT IS NOT CONTROLLED WITH YOUR NAUSEA MEDICATION  *UNUSUAL SHORTNESS OF BREATH  *UNUSUAL BRUISING OR BLEEDING  TENDERNESS IN MOUTH AND THROAT WITH OR WITHOUT PRESENCE OF ULCERS  *URINARY PROBLEMS  *BOWEL PROBLEMS  UNUSUAL RASH Items with * indicate a potential emergency and should be followed up as soon as possible. If you have an emergency after office hours please contact your primary care physician or go to the nearest emergency department.  Please call the clinic during office hours if you have any questions or concerns.   You may also contact the Patient Navigator at [phone] should you have any questions or need assistance in obtaining follow up care.      Resources For Cancer Patients and their Caregivers ? American Cancer Society: Can assist with transportation, wigs, general needs, runs Look Good Feel Better.        (612) 709-5743 ? Cancer Care: Provides financial assistance, online support groups, medication/co-pay assistance.  1-800-813-HOPE  (561)404-0556) ? [place] Assists [place] cancer patients and their families through emotional , educational and financial support.  902-879-4675 ? [place] DSS Where to apply for food stamps, Medicaid and utility assistance. (901)767-8670 ? [facility]: Transportation to medical appointments. 914 817 8076 ? Social Security Administration: May apply for disability if have a Stage IV cancer. 978-014-6725 918 760 8571 ? [employer], Disability and Transit Services: Assists with nutrition, care and transit needs. 727-104-7732

## 2018-02-25 NOTE — Progress Notes (Signed)
Angela Lawson, [place] [address]   CLINIC:  Medical Oncology/Hematology  PCP:  Angela Fire, MD [place] [address] [phone]   REASON FOR VISIT: Metastatic lung Lawson   CURRENT THERAPY: Tecentriq every 3 weeks.  BRIEF ONCOLOGIC HISTORY:    Adenocarcinoma of lung (Angela Lawson)   07/07/2008 Surgery    Performed in [place], [place].  Consistent with poorly differentiated adenocarcinoma.      02/27/2014 Imaging    CT of chest demonstrating a RLL solid nodular component measuring 9 mm at the site of previous ground-glass opacity. Slight increase in size of an anterior mediastinal node measuring 1.1 cm with stable 1 cm pretracheal lymph node. Consider PET      03/20/2014 Imaging    PET scan- No hypermetabolic pulmonary lesions to suggest recurrent or metastatic pulmonary disease.  Weakly positive left internal mammary lymph node and left axillary lymph node (? inflammatory process involving the left breast).      11/07/2015 Imaging    CT chest- 1. No evidence of metastatic involvement of the lungs. The previously noted small noncalcified lung nodules are stable. 2. Vague focal opacity in the right lower lobe may represent scarring and is stable. Continued followup however is recommended in 1 year.      01/08/2017 Imaging    CT chest- 1. New nodules along a thick walled bullous lesion in the right lower lobe, suspicious for malignancy. 2. New fairly large region of confluent ground-glass opacity posteriorly in the remaining left lung, low grade adenocarcinoma is not excluded and there is mild new nodularity along the upper margin of this process. 3. Stable biapical pleuroparenchymal scarring with several stable nodules in the left lung. 4. Borderline adenopathy in the mediastinum, essentially stable from prior. 5. Coronary, aortic arch, and branch vessel atherosclerotic vascular disease. 6. Small hiatal hernia. 7.  Hypodense left thyroid lesion. If not previously worked up, thyroid ultrasound could be employed.      01/21/2017 PET scan    1. New hypermetabolic adenopathy in the mediastinum and hila, with progressive hypermetabolic ground-glass density posteriorly in the left lower lobe with faint nodular components, and some low-grade hypermetabolic activity in ground-glass opacity dependently in the right upper lobe. Although the appearance is concerning for malignancy potentially with a lymphangitic component in the left lower lobe, the unusual configuration in the appearance of the ground-glass opacities could conceivably represent an atypical granulomatous infectious process as an alternative. This is not classic obvious recurrence with well-defined mass or nodules. 2. There is some ill-defined clustered nodularity in the right lower lobe only faintly metabolic, maximum SUV 3.0. 3. Overall I believe that this process require surveillance to differentiate an inflammatory/infectious condition from recurrent malignancy. The adenopathy is certainly concerning. 4. No findings of malignancy in the neck, abdomen/pelvis, or skeleton. 5. Sigmoid colon diverticulosis. 6. Emphysema. 7. Chronic ethmoid sinusitis.      03/25/2017 Imaging    CT chest- Stable 5.9 cm left lower lobe ground-glass opacity, hypermetabolic on prior PET, worrisome for primary bronchogenic neoplasm such as low-grade adenocarcinoma.  Stable 2.2 cm cavitary lesion in the central right lower lobe with surrounding hypermetabolic nodularity measuring up to 1.8 cm, worrisome for primary bronchogenic neoplasm such as squamous cell carcinoma.  Hypermetabolic mediastinal lymph nodes measuring up to 11 mm short axis, as above. Bilateral hilar regions are hypermetabolic on PET but poorly evaluated on unenhanced CT.  Emphysema and aortic atherosclerosis.      04/23/2017 Procedure  Bronchoscopy and EBUS with mediastinal lymph node  aspirations by Dr. Roxan Lawson.      04/26/2017 Pathology Results    1. Lung, biopsy, Left upper lobe - ADENOCARCINOMA. - SEE COMMENT. 2. Lung, biopsy, Right lower lobe - ADENOCARCINOMA. - SEE COMMENT. 3. Lung, biopsy, Right lower lobe target 2 - ATYPICAL CELLS. - SEE COMMENT.      04/26/2017 Relapse/Recurrence         05/14/2017 Pathology Results    PDL1 NEGATIVE.  TPS 0%.      05/19/2017 Pathology Results    Foundation[employer] testing- MS-stable.  TMB 6 Muts/Mb (Intermediate).  STK11 E293*.  Negative for EGFR, KRAS, ALK, BRAF, MET, RET, ERBB2, and ROS1.  Potential treatment options: Atezolizumab/Durvalumab/Nivolumab/Pembrolizumab for TMB score.      07/27/2017 Imaging    CT C/A/P: IMPRESSION: Resolution of right lower lobe lesion and nodules.  Unchanged ill-defined nonspecific left lower lobe ground-glass opacity and stable mildly prominent mediastinal lymph nodes.  No evidence of metastatic disease or acute abnormality within the abdomen or pelvis.  Aortic Atherosclerosis (ICD10-I70.0) and Emphysema (ICD10-J43.9).      10/08/2017 Imaging    CT C/A/P: Similar appearance of the chest with an upper normal sized paratracheal lymph node, confluent ground-glass opacity in the left lower lobe, and severe centrilobular emphysema, but no findings of progressive or definite active malignancy.        Lawson STAGING: Lawson Staging Adenocarcinoma of lung [facility]) Staging form: Lung, AJCC 7th Edition - Clinical: Stage IA - Signed by Angela Cancer, PA-C on 02/27/2014 - Pathologic stage from 04/26/2017: Stage IV (T2b(2), N3, M1a) - Signed by Angela Cancer, PA-C on 04/26/2017    INTERVAL HISTORY:  Angela Lawson 84 y.o. female returns for routine follow-up and consideration for next cycle of immunotherapy.   She is accompanied by her daughter today.  She was admitted to the hospital from March 1 - February 08 2018 with COPD exacerbation.  Her breathing is back to baseline.  She reports  good appetite.  She denies any nausea vomiting, diarrhea, skin rashes or itching.  She denies any headaches or vision changes.  She is able to do all her ADLs at home.    REVIEW OF SYSTEMS:  Review of Systems  Constitutional: Negative.   HENT:  Negative.   Eyes: Negative.   Respiratory: Positive for shortness of breath.   Cardiovascular: Positive for leg swelling.  Gastrointestinal: Negative.   Genitourinary: Negative.    Musculoskeletal: Negative.   Skin: Negative.   Neurological: Negative.   Psychiatric/Behavioral: Negative.      PAST MEDICAL/SURGICAL HISTORY:  Past Medical History:  Diagnosis Date  . Adenocarcinoma of lung ([place])    Left lung 2009, resected  . Anginal pain ([place])   . Arthritis   . Back pain   . CHF (congestive heart failure) ([place])   . COPD (chronic obstructive pulmonary disease) ([place])   . Diverticulitis   . Dyslipidemia   . Essential hypertension   . H/O ventral hernia   . Non-obstructive CAD    a. 04/2015 NSTEMI/Cath: LAD 10p, LCX 61m RCA 351m20d, EF 35-40 w/ apical ballooning.  . On home O2    3L N/C  . Osteoporosis   . Osteoporosis 11/04/2015   Managed by Dr. FaLegrand Rams . Parkinson's disease ([place]  . Shortness of breath   . Takotsubo cardiomyopathy    a. 04/2015 Echo: EF 45-50%, mid-dist anterior/apical/inferoapical HK w/ hyperdynamic base. Gr 1 DD, mild AI, mild-mod MR, triv  TR, PASP 75mHg;  b. 04/2015 LV gram: Ef 35-40% w/ apical ballooning.  . Type II diabetes mellitus ([place]   . Ventricular bigeminy    a. 04/2015 in setting of NSTEMI/Takotsubo.   Past Surgical History:  Procedure Laterality Date  . ABDOMINAL HYSTERECTOMY    . CARDIAC CATHETERIZATION N/A 04/17/2015   Procedure: Left Heart Cath and Coronary Angiography;  Surgeon: TTroy Sine MD;  [facility];  Service: Cardiovascular;  Laterality: N/A;  . CHOLECYSTECTOMY    . COLONOSCOPY N/A 09/18/2014   Procedure: COLONOSCOPY;  Surgeon: SDanie Binder MD;  Location: [facility]  ENDO SUITE;  Service: Endoscopy;  Laterality: N/A;  8:30 AM - moved to 10:30 -Rosendo Grosto notify pt  . ECTOPIC PREGNANCY SURGERY    . INCISIONAL HERNIA REPAIR N/A 08/26/2013   Procedure: HERNIA REPAIR INCISIONAL WITH MESH;  Surgeon: MJamesetta So MD;  Location: [facility] ORS;  Service: General;  Laterality: N/A;  . IR FLUORO GUIDE PORT INSERTION RIGHT  04/30/2017  . IR [place] VASC ACCESS RIGHT  04/30/2017  . LUNG Lawson SURGERY    . VIDEO BRONCHOSCOPY WITH ENDOBRONCHIAL NAVIGATION N/A 04/23/2017   Procedure: VIDEO BRONCHOSCOPY WITH ENDOBRONCHIAL NAVIGATION;  Surgeon: HMelrose Nakayama MD;  [facility]  Service: Thoracic;  Laterality: N/A;  . VIDEO BRONCHOSCOPY WITH ENDOBRONCHIAL ULTRASOUND N/A 04/23/2017   Procedure: VIDEO BRONCHOSCOPY WITH ENDOBRONCHIAL ULTRASOUND;  Surgeon: HMelrose Nakayama MD;  [facility];  Service: Thoracic;  Laterality: N/A;     SOCIAL HISTORY:  Social History   Socioeconomic History  . Marital status: Widowed    Spouse name: Not on file  . Number of children: Not on file  . Years of education: Not on file  . Highest education level: Not on file  Occupational History  . Not on file  Social Needs  . Financial resource strain: Not on file  . Food insecurity:    Worry: Not on file    Inability: Not on file  . Transportation needs:    Medical: Not on file    Non-medical: Not on file  Tobacco Use  . Smoking status: Former Smoker    Years: 20.00    Types: Cigarettes    Last attempt to quit: 08/13/2006    Years since quitting: 11.5  . Smokeless tobacco: Never Used  Substance and Sexual Activity  . Alcohol use: No    Alcohol/week: 0.0 oz  . Drug use: No  . Sexual activity: Not Currently    Birth control/protection: Surgical  Lifestyle  . Physical activity:    Days per week: Not on file    Minutes per session: Not on file  . Stress: Not on file  Relationships  . Social connections:    Talks on phone: Not on file    Gets together: Not on  file    Attends religious service: Not on file    Active member of club or organization: Not on file    Attends meetings of clubs or organizations: Not on file    Relationship status: Not on file  . Intimate partner violence:    Fear of current or ex partner: Not on file    Emotionally abused: Not on file    Physically abused: Not on file    Forced sexual activity: Not on file  Other Topics Concern  . Not on file  Social History Narrative  . Not on file    FAMILY HISTORY:  Family History  Problem  Relation Age of Onset  . Diabetes Mother   . Hypertension Mother   . Diabetes Father   . Asthma Unknown   . Lawson Unknown   . Heart attack Sister        X2  . Colon Lawson Neg Hx     CURRENT MEDICATIONS:  Outpatient Encounter Medications as of 02/25/2018  Medication Sig Note  . albuterol (PROAIR HFA) 108 (90 BASE) MCG/ACT inhaler Inhale 2 puffs into the lungs every 4 (four) hours as needed for wheezing or shortness of breath.   Marland Kitchen alendronate (FOSAMAX) 70 MG tablet Take 70 mg by mouth every Friday.    Marland Kitchen aspirin EC 81 MG tablet Take 81 mg by mouth daily.   Huey Bienenstock (TECENTRIQ IV) Inject into the vein. Every 3 weeks 02/04/2018: Administered on 02/04/2018  . atorvastatin (LIPITOR) 40 MG tablet Take 40 mg by mouth at bedtime.    . B-D ULTRAFINE III SHORT PEN 31G X 8 MM MISC See admin instructions.   . benzonatate (TESSALON) 100 MG capsule Take 1 capsule (100 mg total) by mouth every 8 (eight) hours.   Marland Kitchen buPROPion (WELLBUTRIN SR) 150 MG 12 hr tablet Take 150 mg by mouth 2 (two) times daily.    . carbidopa-levodopa (SINEMET IR) 25-100 MG tablet Take 1 tablet by mouth 2 (two) times daily.   . cholecalciferol (VITAMIN D) 1000 units tablet Take 1,000 Units by mouth daily.   . clotrimazole-betamethasone (LOTRISONE) cream Apply 1 application topically 2 (two) times daily. 12/04/2017: Patient still has if needed but has not taken recently  . doxycycline (VIBRAMYCIN) 100 MG capsule Take 1  capsule (100 mg total) by mouth 2 (two) times daily.   . furosemide (LASIX) 40 MG tablet Take 1 tablet (40 mg total) by mouth daily. Pt is able to take one additional tablet daily for weight increases of 3lbs in a day or 5lbs in a week. Resume on 10/20   . gabapentin (NEURONTIN) 300 MG capsule Take 600 mg by mouth at bedtime.    . Garlic 482 MG TABS Take 300 mg by mouth daily.   Marland Kitchen guaiFENesin (MUCINEX) 600 MG 12 hr tablet Take 1 tablet (600 mg total) by mouth 2 (two) times daily.   Marland Kitchen ipratropium-albuterol (DUONEB) 0.5-2.5 (3) MG/3ML SOLN Inhale 3 mLs into the lungs every 6 (six) hours as needed (for wheezing/shortness of breath).    Marland Kitchen KLOR-CON 10 10 MEQ tablet TAKE 1 TABLET (10 MEQ TOTAL) BY MOUTH DAILY. *FURTHER REFILLS NEED TO BE AUTHORIZED BY PCP* (Patient taking differently: TAKE 1 TABLET (10 MEQ TOTAL) BY MOUTH DAILY.)   . LANTUS SOLOSTAR 100 UNIT/ML Solostar Pen Inject 15 Units into the skin at bedtime.    . lidocaine-prilocaine (EMLA) cream Apply to affected area once (Patient taking differently: Apply 1 application topically daily as needed. )   . linagliptin (TRADJENTA) 5 MG TABS tablet Take 5 mg by mouth daily.   Marland Kitchen loratadine (CLARITIN) 10 MG tablet Take 10 mg by mouth daily.   . meclizine (ANTIVERT) 25 MG tablet Take 25 mg by mouth daily.    . metoprolol succinate (TOPROL-XL) 25 MG 24 hr tablet Take 1 tablet (25 mg total) by mouth daily.   . mometasone-formoterol (DULERA) 200-5 MCG/ACT AERO Inhale 2 puffs into the lungs 2 (two) times daily.   . nitroGLYCERIN (NITROSTAT) 0.4 MG SL tablet Place 0.4 mg under the tongue every 5 (five) minutes as needed for chest pain.   . Omega-3 Fatty Acids (FISH OIL  PO) Take 1 capsule by mouth daily.   Marland Kitchen omeprazole (PRILOSEC) 40 MG capsule Take 40 mg by mouth daily.   . ondansetron (ZOFRAN) 8 MG tablet Take 1 tablet (8 mg total) by mouth 2 (two) times daily as needed (Nausea or vomiting).   . OXYGEN Inhale 3 L into the lungs continuous.   . polyethylene  glycol powder (GLYCOLAX/MIRALAX) powder Take 17 g by mouth daily as needed.   . predniSONE (DELTASONE) 10 MG tablet Take 89m po daily for 2 days then 467mdaily for 2 days then 3017maily for 2 days then 82m42mily for 2 days then 10mg44mly for 2 days then stop   . prochlorperazine (COMPAZINE) 10 MG tablet Take 1 tablet (10 mg total) by mouth every 6 (six) hours as needed (Nausea or vomiting).   . pyridoxine (B-6) 100 MG tablet Take 100 mg by mouth daily.    . rOPMarland KitchenNIRole (REQUIP) 0.5 MG tablet Take 0.5 mg by mouth 2 (two) times daily.    . sodium chloride (OCEAN) 0.65 % SOLN nasal spray Place 1 spray into both nostrils every 3 (three) hours as needed for congestion.    . traMADol (ULTRAM) 50 MG tablet Take 50 mg by mouth every 12 (twelve) hours as needed for moderate pain.  12/04/2017: Patient does not take often as it does not seem to help with pain  . zolpidem (AMBIEN) 5 MG tablet Take 5 mg by mouth at bedtime as needed for sleep.    No facility-administered encounter medications on file as of 02/25/2018.     ALLERGIES:  Allergies  Allergen Reactions  . Lisinopril Cough     PHYSICAL EXAM:  ECOG Performance status: 1 There were no vitals filed for this visit. There were no vitals filed for this visit.  Physical Exam  Constitutional: She is well-developed, well-nourished, and in no distress.  Neck: Normal range of motion. Neck supple.  Cardiovascular: Normal rate and regular rhythm.  Pulmonary/Chest: Effort normal and breath sounds normal.     LABORATORY DATA:  I have reviewed the labs as listed.  CBC    Component Value Date/Time   WBC 9.7 02/25/2018 0941   RBC 4.05 02/25/2018 0941   HGB 11.4 (L) 02/25/2018 0941   HCT 38.1 02/25/2018 0941   PLT 142 (L) 02/25/2018 0941   MCV 94.1 02/25/2018 0941   MCH 28.1 02/25/2018 0941   MCHC 29.9 (L) 02/25/2018 0941   RDW 16.7 (H) 02/25/2018 0941   LYMPHSABS 1.5 02/25/2018 0941   MONOABS 0.7 02/25/2018 0941   EOSABS 0.1 02/25/2018  0941   BASOSABS 0.0 02/25/2018 0941   CMP Latest Ref Rng & Units 02/25/2018 02/07/2018 02/05/2018  Glucose 65 - 99 mg/dL 153(H) 190(H) 147(H)  BUN 6 - 20 mg/dL 16 36(H) 32(H)  Creatinine 0.44 - 1.00 mg/dL 1.31(H) 1.33(H) 1.48(H)  Sodium 135 - 145 mmol/L 136 139 138  Potassium 3.5 - 5.1 mmol/L 4.1 4.8 5.0  Chloride 101 - 111 mmol/L 99(L) 105 101  CO2 22 - 32 mmol/L '26 27 23  ' Calcium 8.9 - 10.3 mg/dL 8.9 9.9 10.8(H)  Total Protein 6.5 - 8.1 g/dL 6.2(L) - 7.4  Total Bilirubin 0.3 - 1.2 mg/dL 0.8 - 0.5  Alkaline Phos 38 - 126 U/L 41 - 50  AST 15 - 41 U/L 21 - 30  ALT 14 - 54 U/L 5(L) - 25       DIAGNOSTIC IMAGING:  *The following radiologic images and reports have been reviewed independently and  agree with below findings.      ASSESSMENT & PLAN:   Adenocarcinoma of lung ([place]) 1.Stage IV adenocarcinoma of the lung: She has bilateral lung nodules and mediastinal adenopathy.  She is currently on Tecentriq which she is tolerating very well.  She does not have any immunotherapy related side effects.  Her functional status is also stable.  She is able to do all her activities.  She will proceed with her next cycle of Tecentriq today.  I plan to do restaging scans prior to next visit in 3 weeks.  2.  COPD: She had a recent admission to the hospital with COPD exacerbation from 3/1 through 02/08/2018.  She continues to be on 3 L nasal cannula.  Her breathing status has improved since the discharge.  3.  Diabetes: She will continue Tradjenta and Lantus.  4.  Parkinson's disease: She is continuing Sinemet.  This is well controlled.        Derek Jack, MD [place] [phone]

## 2018-02-25 NOTE — Patient Instructions (Signed)
[place] at [facility] Discharge Instructions  You were seen today by Dr. Delton Coombes    Thank you for choosing [place] at [facility] to provide your oncology and hematology care.  To afford each patient quality time with our provider, please arrive at least 15 minutes before your scheduled appointment time.   If you have a lab appointment with the [facility] please come in thru the  Main Entrance and check in at the main information desk  You need to re-schedule your appointment should you arrive 10 or more minutes late.  We strive to give you quality time with our providers, and arriving late affects you and other patients whose appointments are after yours.  Also, if you no show three or more times for appointments you may be dismissed from the clinic at the providers discretion.     Again, thank you for choosing [facility].  Our hope is that these requests will decrease the amount of time that you wait before being seen by our physicians.       _____________________________________________________________  Should you have questions after your visit to [facility], please contact our office at [phone] between the hours of 8:30 a.m. and 4:30 p.m.  Voicemails left after 4:30 p.m. will not be returned until the following business day.  For prescription refill requests, have your pharmacy contact our office.       Resources For Cancer Patients and their Caregivers ? American Cancer Society: Can assist with transportation, wigs, general needs, runs Look Good Feel Better.        680-816-1813 ? Cancer Care: Provides financial assistance, online support groups, medication/co-pay assistance.  1-800-813-HOPE (903)393-5717) ? [place] Assists [place] cancer patients and their families through emotional , educational and financial support.  (605)489-5149 ? [place] DSS Where to  apply for food stamps, Medicaid and utility assistance. 989-048-6408 ? [facility]: Transportation to medical appointments. 339-330-9902 ? Social Security Administration: May apply for disability if have a Stage IV cancer. 4458856730 762 210 4705 ? [employer], Disability and Transit Services: Assists with nutrition, care and transit needs. [place] Support Programs:   > Cancer Support Group  2nd Tuesday of the month 1pm-2pm, Journey Room   > Creative Journey  3rd Tuesday of the month 1130am-1pm, Journey Room

## 2018-02-26 DIAGNOSIS — J449 Chronic obstructive pulmonary disease, unspecified: Secondary | ICD-10-CM | POA: Diagnosis not present

## 2018-02-26 DIAGNOSIS — I5032 Chronic diastolic (congestive) heart failure: Secondary | ICD-10-CM | POA: Diagnosis not present

## 2018-02-28 NOTE — Progress Notes (Signed)
Diagnosis Malignant neoplasm of lung, unspecified laterality, unspecified part of lung ([place]) - Plan: DG Chest 2 View  Staging Cancer Staging Adenocarcinoma of lung [facility]) Staging form: Lung, AJCC 7th Edition - Clinical: Stage IA - Signed by Baird Cancer, PA-C on 02/27/2014 - Pathologic stage from 04/26/2017: Stage IV (T2b(2), N3, M1a) - Signed by Baird Cancer, PA-C on 04/26/2017   Assessment and Plan:  1.  Stage IV NSCLC.  Pt is seen today for evaluation prior to Tecentriq.  Labs are adequate for chemotherapy.    She has wheezing noted on exam and had CXR done today that showed no acute findings.  She should follow-up with Dr. Luan Pulling if ongoing respiratory issues.  She will be set up for scans in 03/2018.    2.  Wheezes.  CXR today showed no acute findings.  She should follow-up with Dr. Luan Pulling.    3.  Hypertension.  BP is 133/59.  Continue to follow-up with PCP.    Interval History:  84 year old female diagnosed in July 2009 with poorly differentiated adenocarcinoma that was resected in [place].  She was previously followed by Dr. Talbert Cage.   In March 2015 she presented with a right lower lobe nodule.  Over the next 3 years she was followed with serial imaging.  In February 2018 there was progression of groundglass opacity within the left lung and no new nodules within the right lower lobe were noted.  Hypermetabolic mediastinal and hilar lymphadenopathy was also noted.  In April 2018 she developed a 2.2 cm cavitary lesion within the right lower lobe with surrounding hypermetabolic nodules in addition to the 5.9 cm groundglass appearance in the left lower lobe which was also hypermetabolic.  She underwent biopsy of the left upper lobe and right lower lobe.  That confirmed adenocarcinoma  Molecular pathology as noted below:   PDL1 NEGATIVE.  TPS 0%.        05/19/2017 Pathology Results    FoundationOne testing- MS-stable.  TMB 6 Muts/Mb (Intermediate).  STK11 E293*.  Negative for  EGFR, KRAS, ALK, BRAF, MET, RET, ERBB2, and ROS1.  Potential treatment options: Atezolizumab/Durvalumab/Nivolumab/Pembrolizumab for TMB score.    Tecentriq initiated in 05/2017     A CT scan from 11/12/2017 of the neck was ordered because of a suspicious neck mass that did not reveal any mass or adenopathy in the neck. A prior CT chest /abdomen pelvis from 11/18 showed slightly enlarged right paratracheal lymph node and confluent groundglass opacity in the left lower lobe with centrilobular severe emphysema with no definite signs of malignancy. And was felt overall stable.   A stable subpleural left upper lobe nodule measuring 0.6 x 0.4 cm was noted in the same area  Current Status:  Pt is seen today for follow-up prior to Tecentriq.        Adenocarcinoma of lung ([place])   07/07/2008 Surgery    Performed in [place], [place].  Consistent with poorly differentiated adenocarcinoma.      02/27/2014 Imaging    CT of chest demonstrating a RLL solid nodular component measuring 9 mm at the site of previous ground-glass opacity. Slight increase in size of an anterior mediastinal node measuring 1.1 cm with stable 1 cm pretracheal lymph node. Consider PET      03/20/2014 Imaging    PET scan- No hypermetabolic pulmonary lesions to suggest recurrent or metastatic pulmonary disease.  Weakly positive left internal mammary lymph node and left axillary lymph node (? inflammatory process involving the left breast).  11/07/2015 Imaging    CT chest- 1. No evidence of metastatic involvement of the lungs. The previously noted small noncalcified lung nodules are stable. 2. Vague focal opacity in the right lower lobe may represent scarring and is stable. Continued followup however is recommended in 1 year.      01/08/2017 Imaging    CT chest- 1. New nodules along a thick walled bullous lesion in the right lower lobe, suspicious for malignancy. 2. New fairly large region of confluent ground-glass  opacity posteriorly in the remaining left lung, low grade adenocarcinoma is not excluded and there is mild new nodularity along the upper margin of this process. 3. Stable biapical pleuroparenchymal scarring with several stable nodules in the left lung. 4. Borderline adenopathy in the mediastinum, essentially stable from prior. 5. Coronary, aortic arch, and branch vessel atherosclerotic vascular disease. 6. Small hiatal hernia. 7. Hypodense left thyroid lesion. If not previously worked up, thyroid ultrasound could be employed.      01/21/2017 PET scan    1. New hypermetabolic adenopathy in the mediastinum and hila, with progressive hypermetabolic ground-glass density posteriorly in the left lower lobe with faint nodular components, and some low-grade hypermetabolic activity in ground-glass opacity dependently in the right upper lobe. Although the appearance is concerning for malignancy potentially with a lymphangitic component in the left lower lobe, the unusual configuration in the appearance of the ground-glass opacities could conceivably represent an atypical granulomatous infectious process as an alternative. This is not classic obvious recurrence with well-defined mass or nodules. 2. There is some ill-defined clustered nodularity in the right lower lobe only faintly metabolic, maximum SUV 3.0. 3. Overall I believe that this process require surveillance to differentiate an inflammatory/infectious condition from recurrent malignancy. The adenopathy is certainly concerning. 4. No findings of malignancy in the neck, abdomen/pelvis, or skeleton. 5. Sigmoid colon diverticulosis. 6. Emphysema. 7. Chronic ethmoid sinusitis.      03/25/2017 Imaging    CT chest- Stable 5.9 cm left lower lobe ground-glass opacity, hypermetabolic on prior PET, worrisome for primary bronchogenic neoplasm such as low-grade adenocarcinoma.  Stable 2.2 cm cavitary lesion in the central right lower lobe  with surrounding hypermetabolic nodularity measuring up to 1.8 cm, worrisome for primary bronchogenic neoplasm such as squamous cell carcinoma.  Hypermetabolic mediastinal lymph nodes measuring up to 11 mm short axis, as above. Bilateral hilar regions are hypermetabolic on PET but poorly evaluated on unenhanced CT.  Emphysema and aortic atherosclerosis.      04/23/2017 Procedure    Bronchoscopy and EBUS with mediastinal lymph node aspirations by Dr. Roxan Hockey.      04/26/2017 Pathology Results    1. Lung, biopsy, Left upper lobe - ADENOCARCINOMA. - SEE COMMENT. 2. Lung, biopsy, Right lower lobe - ADENOCARCINOMA. - SEE COMMENT. 3. Lung, biopsy, Right lower lobe target 2 - ATYPICAL CELLS. - SEE COMMENT.      04/26/2017 Relapse/Recurrence         05/14/2017 Pathology Results    PDL1 NEGATIVE.  TPS 0%.      05/19/2017 Pathology Results    FoundationOne testing- MS-stable.  TMB 6 Muts/Mb (Intermediate).  STK11 E293*.  Negative for EGFR, KRAS, ALK, BRAF, MET, RET, ERBB2, and ROS1.  Potential treatment options: Atezolizumab/Durvalumab/Nivolumab/Pembrolizumab for TMB score.      07/27/2017 Imaging    CT C/A/P: IMPRESSION: Resolution of right lower lobe lesion and nodules.  Unchanged ill-defined nonspecific left lower lobe ground-glass opacity and stable mildly prominent mediastinal lymph nodes.  No evidence of metastatic disease or  acute abnormality within the abdomen or pelvis.  Aortic Atherosclerosis (ICD10-I70.0) and Emphysema (ICD10-J43.9).      10/08/2017 Imaging    CT C/A/P: Similar appearance of the chest with an upper normal sized paratracheal lymph node, confluent ground-glass opacity in the left lower lobe, and severe centrilobular emphysema, but no findings of progressive or definite active malignancy.        Problem List Patient Active Problem List   Diagnosis Date Noted  . Respiratory failure ([place]) [J96.90] 02/06/2018  . Acute respiratory failure  ([place]) [J96.00] 12/04/2017  . Community acquired pneumonia of left lung [J18.9] 07/19/2017  . Critical lower limb ischemia [I99.8] 07/07/2017  . Counseling regarding advanced care planning and goals of care [Z71.89] 05/15/2017  . Acute renal injury ([place]) [N17.9] 04/04/2017  . Chest pain [R07.9] 04/04/2017  . Nausea and vomiting [R11.2] 04/04/2017  . COPD with acute exacerbation ([place]) [J44.1] 04/25/2016  . Acute on chronic respiratory failure with hypoxia ([place]) [J96.21] 04/25/2016  . SOB (shortness of breath) [R06.02]   . Osteoporosis [M81.0] 11/04/2015  . Chronic diastolic CHF (congestive heart failure) ([place]) [I50.32] 08/01/2015  . CHF exacerbation ([place]) [I50.9] 07/09/2015  . Essential hypertension [I10] 04/19/2015  . Type II diabetes mellitus ([place]) [E11.9] 04/19/2015  . Dyslipidemia [E78.5] 04/19/2015  . History of lung cancer [Z85.118] 04/19/2015  . Takotsubo cardiomyopathy [I51.81] 04/19/2015  . Ventricular bigeminy [I49.9] 04/19/2015  . NSTEMI (non-ST elevated myocardial infarction) ([place]) [I21.4] 04/19/2015  . Stress-induced cardiomyopathy [I51.81] 04/18/2015  . Elevated troponin [R74.8] 04/16/2015  . COPD exacerbation ([place]) [J44.1] 04/16/2015  . CKD (chronic kidney disease) stage 3, GFR 30-59 ml/min ([facility]) [N18.3] 04/16/2015  . Herpes genitalis in women [A60.09] 11/24/2014  . Toe fracture [S92.919A] 09/28/2014  . Non-traumatic tear of right rotator cuff [M75.101] 11/30/2013  . Pain in joint, shoulder region [M25.519] 11/30/2013  . Muscle weakness (generalized) [M62.81] 11/30/2013  . Bursitis of left shoulder [M75.52] 02/09/2013  . Adenocarcinoma of lung ([place]) [C34.90] 08/20/2012    Past Medical History Past Medical History:  Diagnosis Date  . Adenocarcinoma of lung ([place])    Left lung 2009, resected  . Anginal pain ([place])   . Arthritis   . Back pain   . CHF (congestive heart failure) ([place])   . COPD (chronic obstructive pulmonary disease) ([place])   . Diverticulitis   .  Dyslipidemia   . Essential hypertension   . H/O ventral hernia   . Non-obstructive CAD    a. 04/2015 NSTEMI/Cath: LAD 10p, LCX 49m RCA 351m20d, EF 35-40 w/ apical ballooning.  . On home O2    3L N/C  . Osteoporosis   . Osteoporosis 11/04/2015   Managed by Dr. FaLegrand Rams . Parkinson's disease ([facility]  . Shortness of breath   . Takotsubo cardiomyopathy    a. 04/2015 Echo: EF 45-50%, mid-dist anterior/apical/inferoapical HK w/ hyperdynamic base. Gr 1 DD, mild AI, mild-mod MR, triv TR, PASP 4872m;  b. 04/2015 LV gram: Ef 35-40% w/ apical ballooning.  . Type II diabetes mellitus ([facility] . Ventricular bigeminy    a. 04/2015 in setting of NSTEMI/Takotsubo.    Past Surgical History Past Surgical History:  Procedure Laterality Date  . ABDOMINAL HYSTERECTOMY    . CARDIAC CATHETERIZATION N/A 04/17/2015   Procedure: Left Heart Cath and Coronary Angiography;  Surgeon: ThoTroy SineD;  Location: [facility];  Service: Cardiovascular;  Laterality: N/A;  . CHOLECYSTECTOMY    . COLONOSCOPY N/A 09/18/2014   Procedure: COLONOSCOPY;  Surgeon: Danie Binder, MD;  Location: [facility];  Service: Endoscopy;  Laterality: N/A;  8:30 AM - moved to 10:30 Rosendo Gros to notify pt  . ECTOPIC PREGNANCY SURGERY    . INCISIONAL HERNIA REPAIR N/A 08/26/2013   Procedure: HERNIA REPAIR INCISIONAL WITH MESH;  Surgeon: Jamesetta So, MD;  Location: [facility] ORS;  Service: General;  Laterality: N/A;  . IR FLUORO GUIDE PORT INSERTION RIGHT  04/30/2017  . IR US GUIDE VASC ACCESS RIGHT  04/30/2017  . LUNG CANCER SURGERY    . VIDEO BRONCHOSCOPY WITH ENDOBRONCHIAL NAVIGATION N/A 04/23/2017   Procedure: VIDEO BRONCHOSCOPY WITH ENDOBRONCHIAL NAVIGATION;  Surgeon: Melrose Nakayama, MD;  Location: [facility];  Service: Thoracic;  Laterality: N/A;  . VIDEO BRONCHOSCOPY WITH ENDOBRONCHIAL ULTRASOUND N/A 04/23/2017   Procedure: VIDEO BRONCHOSCOPY WITH ENDOBRONCHIAL ULTRASOUND;  Surgeon: Melrose Nakayama, MD;  Location: [facility] OR;   Service: Thoracic;  Laterality: N/A;    Family History Family History  Problem Relation Age of Onset  . Diabetes Mother   . Hypertension Mother   . Diabetes Father   . Asthma Unknown   . Cancer Unknown   . Heart attack Sister        X2  . Colon cancer Neg Hx      Social History  reports that she quit smoking about 11 years ago. Her smoking use included cigarettes. She quit after 20.00 years of use. She has never used smokeless tobacco. She reports that she does not drink alcohol or use drugs.  Medications  Current Outpatient Medications:  .  albuterol (PROAIR HFA) 108 (90 BASE) MCG/ACT inhaler, Inhale 2 puffs into the lungs every 4 (four) hours as needed for wheezing or shortness of breath., Disp: , Rfl:  .  alendronate (FOSAMAX) 70 MG tablet, Take 70 mg by mouth every Friday. , Disp: , Rfl:  .  aspirin EC 81 MG tablet, Take 81 mg by mouth daily., Disp: , Rfl:  .  Atezolizumab (TECENTRIQ IV), Inject into the vein. Every 3 weeks, Disp: , Rfl:  .  atorvastatin (LIPITOR) 40 MG tablet, Take 40 mg by mouth at bedtime. , Disp: , Rfl:  .  B-D ULTRAFINE III SHORT PEN 31G X 8 MM MISC, See admin instructions., Disp: , Rfl: 1 .  buPROPion (WELLBUTRIN SR) 150 MG 12 hr tablet, Take 150 mg by mouth 2 (two) times daily. , Disp: , Rfl:  .  carbidopa-levodopa (SINEMET IR) 25-100 MG tablet, Take 1 tablet by mouth 2 (two) times daily., Disp: , Rfl:  .  cholecalciferol (VITAMIN D) 1000 units tablet, Take 1,000 Units by mouth daily., Disp: , Rfl:  .  clotrimazole-betamethasone (LOTRISONE) cream, Apply 1 application topically 2 (two) times daily., Disp: 30 g, Rfl: 0 .  furosemide (LASIX) 40 MG tablet, Take 1 tablet (40 mg total) by mouth daily. Pt is able to take one additional tablet daily for weight increases of 3lbs in a day or 5lbs in a week. Resume on 10/20, Disp: 30 tablet, Rfl: 0 .  gabapentin (NEURONTIN) 300 MG capsule, Take 600 mg by mouth at bedtime. , Disp: , Rfl:  .  Garlic 659 MG TABS, Take  300 mg by mouth daily., Disp: , Rfl:  .  guaiFENesin (MUCINEX) 600 MG 12 hr tablet, Take 1 tablet (600 mg total) by mouth 2 (two) times daily., Disp: 60 tablet, Rfl: 2 .  ipratropium-albuterol (DUONEB) 0.5-2.5 (3) MG/3ML SOLN, Inhale 3 mLs into the lungs every 6 (six) hours as  needed (for wheezing/shortness of breath). , Disp: , Rfl:  .  KLOR-CON 10 10 MEQ tablet, TAKE 1 TABLET (10 MEQ TOTAL) BY MOUTH DAILY. *FURTHER REFILLS NEED TO BE AUTHORIZED BY PCP* (Patient taking differently: TAKE 1 TABLET (10 MEQ TOTAL) BY MOUTH DAILY.), Disp: 90 tablet, Rfl: 3 .  LANTUS SOLOSTAR 100 UNIT/ML Solostar Pen, Inject 15 Units into the skin at bedtime. , Disp: , Rfl:  .  lidocaine-prilocaine (EMLA) cream, Apply to affected area once (Patient taking differently: Apply 1 application topically daily as needed. ), Disp: 30 g, Rfl: 3 .  linagliptin (TRADJENTA) 5 MG TABS tablet, Take 5 mg by mouth daily., Disp: , Rfl:  .  meclizine (ANTIVERT) 25 MG tablet, Take 25 mg by mouth daily. , Disp: , Rfl:  .  metoprolol succinate (TOPROL-XL) 25 MG 24 hr tablet, Take 1 tablet (25 mg total) by mouth daily., Disp: 90 tablet, Rfl: 3 .  nitroGLYCERIN (NITROSTAT) 0.4 MG SL tablet, Place 0.4 mg under the tongue every 5 (five) minutes as needed for chest pain., Disp: , Rfl:  .  Omega-3 Fatty Acids (FISH OIL PO), Take 1 capsule by mouth daily., Disp: , Rfl:  .  omeprazole (PRILOSEC) 40 MG capsule, Take 40 mg by mouth daily., Disp: , Rfl:  .  ondansetron (ZOFRAN) 8 MG tablet, Take 1 tablet (8 mg total) by mouth 2 (two) times daily as needed (Nausea or vomiting)., Disp: 30 tablet, Rfl: 1 .  OXYGEN, Inhale 3 L into the lungs continuous., Disp: , Rfl:  .  prochlorperazine (COMPAZINE) 10 MG tablet, Take 1 tablet (10 mg total) by mouth every 6 (six) hours as needed (Nausea or vomiting)., Disp: 30 tablet, Rfl: 1 .  pyridoxine (B-6) 100 MG tablet, Take 100 mg by mouth daily. , Disp: , Rfl:  .  rOPINIRole (REQUIP) 0.5 MG tablet, Take 0.5 mg by  mouth 2 (two) times daily. , Disp: , Rfl:  .  sodium chloride (OCEAN) 0.65 % SOLN nasal spray, Place 1 spray into both nostrils every 3 (three) hours as needed for congestion. , Disp: , Rfl:  .  traMADol (ULTRAM) 50 MG tablet, Take 50 mg by mouth every 12 (twelve) hours as needed for moderate pain. , Disp: , Rfl:  .  zolpidem (AMBIEN) 5 MG tablet, Take 5 mg by mouth at bedtime as needed for sleep., Disp: , Rfl:  .  benzonatate (TESSALON) 100 MG capsule, Take 1 capsule (100 mg total) by mouth every 8 (eight) hours., Disp: 21 capsule, Rfl: 0 .  doxycycline (VIBRAMYCIN) 100 MG capsule, Take 1 capsule (100 mg total) by mouth 2 (two) times daily., Disp: 20 capsule, Rfl: 0 .  loratadine (CLARITIN) 10 MG tablet, Take 10 mg by mouth daily., Disp: , Rfl:  .  mometasone-formoterol (DULERA) 200-5 MCG/ACT AERO, Inhale 2 puffs into the lungs 2 (two) times daily., Disp: 1 Inhaler, Rfl: 1 .  polyethylene glycol powder (GLYCOLAX/MIRALAX) powder, Take 17 g by mouth daily as needed., Disp: , Rfl:  .  predniSONE (DELTASONE) 10 MG tablet, Take 75m po daily for 2 days then 462mdaily for 2 days then 3040maily for 2 days then 41m83mily for 2 days then 10mg71mly for 2 days then stop, Disp: 32 tablet, Rfl: 0  Allergies Lisinopril  Review of Systems Review of Systems - Oncology ROS as per HPI otherwise 12 point ROS is negative.   Physical Exam  Vitals Wt Readings from Last 3 Encounters:  02/25/18 177 lb 9.6 oz (  80.6 kg)  02/08/18 175 lb 14.8 oz (79.8 kg)  02/04/18 170 lb (77.1 kg)   Temp Readings from Last 3 Encounters:  02/25/18 (!) 96.7 F (35.9 C) (Oral)  02/08/18 98.3 F (36.8 C) (Oral)  02/04/18 97.9 F (36.6 C) (Oral)   BP Readings from Last 3 Encounters:  02/25/18 (!) 128/40  02/08/18 (!) 164/73  02/04/18 (!) 133/59   Pulse Readings from Last 3 Encounters:  02/25/18 77  02/08/18 73  02/04/18 98   Constitutional: Well-developed, well-nourished, and in no distress.   HENT: Head:  Normocephalic and atraumatic.  Mouth/Throat: No oropharyngeal exudate. Mucosa moist. Eyes: Pupils are equal, round, and reactive to light. Conjunctivae are normal. No scleral icterus.  Neck: Normal range of motion. Neck supple. No JVD present.  Cardiovascular: Normal rate, regular rhythm and normal heart sounds.  Exam reveals no gallop and no friction rub.   No murmur heard. Pulmonary/Chest: Effort normal.  Wheezes noted.   Abdominal: Soft. Bowel sounds are normal. No distension. There is no tenderness. There is no guarding.  Musculoskeletal: No edema or tenderness.  Lymphadenopathy: No cervical,axillary or supraclavicular adenopathy.  Neurological: Alert and oriented to person, place, and time. No cranial nerve deficit.  Skin: Skin is warm and dry. No rash noted. No erythema. No pallor.  Psychiatric: Affect and judgment normal.   Labs Admission on 02/04/2018, Discharged on 02/04/2018  Component Date Value Ref Range Status  . Troponin I 02/04/2018 0.06* <0.03 ng/mL Final   Comment: CRITICAL RESULT CALLED TO, READ BACK BY AND VERIFIED WITH: GENTRY,R ON 02/04/18 AT 1700 BY LOY,C Performed at [facility], [address] [place], [place] [address]   . B Natriuretic Peptide 02/04/2018 70.0  0.0 - 100.0 pg/mL Final   Performed at [facility], [address], [place], [place] [address]  Infusion on 02/04/2018  Component Date Value Ref Range Status  . TSH 02/04/2018 1.300  0.350 - 4.500 uIU/mL Final   Comment: Performed by a 3rd Generation assay with a functional sensitivity of <=0.01 uIU/mL. Performed at [facility], [address], [place], [place] [address]   . Sodium 02/04/2018 137  135 - 145 mmol/L Final  . Potassium 02/04/2018 4.1  3.5 - 5.1 mmol/L Final  . Chloride 02/04/2018 100* 101 - 111 mmol/L Final  . CO2 02/04/2018 25  22 - 32 mmol/L Final  . Glucose, Bld 02/04/2018 148* 65 - 99 mg/dL Final  . BUN 02/04/2018 17  6 - 20 mg/dL Final  . Creatinine, Ser 02/04/2018 1.34* 0.44 -  1.00 mg/dL Final  . Calcium 02/04/2018 10.0  8.9 - 10.3 mg/dL Final  . Total Protein 02/04/2018 7.1  6.5 - 8.1 g/dL Final  . Albumin 02/04/2018 3.8  3.5 - 5.0 g/dL Final  . AST 02/04/2018 28  15 - 41 U/L Final  . ALT 02/04/2018 6* 14 - 54 U/L Final  . Alkaline Phosphatase 02/04/2018 52  38 - 126 U/L Final  . Total Bilirubin 02/04/2018 0.5  0.3 - 1.2 mg/dL Final  . GFR calc non Af Amer 02/04/2018 36* >60 mL/min Final  . GFR calc Af Amer 02/04/2018 42* >60 mL/min Final   Comment: (NOTE) The eGFR has been calculated using the CKD EPI equation. This calculation has not been validated in all clinical situations. eGFR's persistently <60 mL/min signify possible Chronic Kidney Disease.   Georgiann Hahn gap 02/04/2018 12  5 - 15 Final   Performed at [facility], [address], [place], [place] [address]  .  WBC 02/04/2018 9.0  4.0 - 10.5 K/uL Final  . RBC 02/04/2018 4.05  3.87 - 5.11 MIL/uL Final  . Hemoglobin 02/04/2018 11.5* 12.0 - 15.0 g/dL Final  . HCT 02/04/2018 38.5  36.0 - 46.0 % Final  . MCV 02/04/2018 95.1  78.0 - 100.0 fL Final  . MCH 02/04/2018 28.4  26.0 - 34.0 pg Final  . MCHC 02/04/2018 29.9* 30.0 - 36.0 g/dL Final  . RDW 02/04/2018 16.2* 11.5 - 15.5 % Final  . Platelets 02/04/2018 244  150 - 400 K/uL Final  . Neutrophils Relative % 02/04/2018 65  % Final  . Neutro Abs 02/04/2018 5.8  1.7 - 7.7 K/uL Final  . Lymphocytes Relative 02/04/2018 19  % Final  . Lymphs Abs 02/04/2018 1.7  0.7 - 4.0 K/uL Final  . Monocytes Relative 02/04/2018 9  % Final  . Monocytes Absolute 02/04/2018 0.8  0.1 - 1.0 K/uL Final  . Eosinophils Relative 02/04/2018 7  % Final  . Eosinophils Absolute 02/04/2018 0.6  0.0 - 0.7 K/uL Final  . Basophils Relative 02/04/2018 0  % Final  . Basophils Absolute 02/04/2018 0.0  0.0 - 0.1 K/uL Final   Performed at [facility], [address], [place], [place] [address]     Pathology Orders Placed This Encounter  Procedures  . DG Chest 2 View    Standing Status:    Future    Number of Occurrences:   1    Standing Expiration Date:   02/04/2019    Order Specific Question:   Reason for Exam (SYMPTOM  OR DIAGNOSIS REQUIRED)    Answer:   lung cancer, wheezing    Order Specific Question:   Preferred imaging location?    Answer:   [facility]    Order Specific Question:   Radiology Contrast Protocol - do NOT remove file path    Answer:   [email or web address]       Zoila Shutter MD

## 2018-03-15 ENCOUNTER — Ambulatory Visit (HOSPITAL_COMMUNITY)
Admission: RE | Admit: 2018-03-15 | Discharge: 2018-03-15 | Disposition: A | Discharge status: Home / Self Care | Payer: Medicare Other | Source: Ambulatory Visit | Attending: Hematology | Admitting: Hematology

## 2018-03-15 ENCOUNTER — Ambulatory Visit (HOSPITAL_COMMUNITY): Payer: Medicare Other

## 2018-03-15 DIAGNOSIS — C3490 Malignant neoplasm of unspecified part of unspecified bronchus or lung: Secondary | ICD-10-CM | POA: Diagnosis not present

## 2018-03-15 DIAGNOSIS — K5790 Diverticulosis of intestine, part unspecified, without perforation or abscess without bleeding: Secondary | ICD-10-CM | POA: Diagnosis not present

## 2018-03-15 DIAGNOSIS — I700 Atherosclerosis of aorta: Secondary | ICD-10-CM | POA: Diagnosis not present

## 2018-03-15 DIAGNOSIS — K5730 Diverticulosis of large intestine without perforation or abscess without bleeding: Secondary | ICD-10-CM | POA: Diagnosis not present

## 2018-03-15 DIAGNOSIS — Z5112 Encounter for antineoplastic immunotherapy: Secondary | ICD-10-CM | POA: Diagnosis not present

## 2018-03-15 MED ORDER — IOPAMIDOL (ISOVUE-300) INJECTION 61%
75.0000 mL | Freq: Once | INTRAVENOUS | Status: AC | PRN
  Administered 2018-03-15: 75 mL via INTRAVENOUS

## 2018-03-18 ENCOUNTER — Inpatient Hospital Stay (HOSPITAL_COMMUNITY): Payer: Medicare Other | Attending: Oncology | Admitting: Hematology

## 2018-03-18 ENCOUNTER — Inpatient Hospital Stay (HOSPITAL_COMMUNITY): Payer: Medicare Other

## 2018-03-18 ENCOUNTER — Encounter (HOSPITAL_COMMUNITY): Payer: Self-pay | Admitting: Hematology

## 2018-03-18 ENCOUNTER — Other Ambulatory Visit: Payer: Self-pay

## 2018-03-18 VITALS — BP 152/79 | HR 78 | Temp 97.6°F | Resp 18

## 2018-03-18 DIAGNOSIS — I110 Hypertensive heart disease with heart failure: Secondary | ICD-10-CM | POA: Diagnosis not present

## 2018-03-18 DIAGNOSIS — Z7952 Long term (current) use of systemic steroids: Secondary | ICD-10-CM | POA: Diagnosis not present

## 2018-03-18 DIAGNOSIS — C3412 Malignant neoplasm of upper lobe, left bronchus or lung: Secondary | ICD-10-CM | POA: Diagnosis not present

## 2018-03-18 DIAGNOSIS — I700 Atherosclerosis of aorta: Secondary | ICD-10-CM | POA: Diagnosis not present

## 2018-03-18 DIAGNOSIS — Z7982 Long term (current) use of aspirin: Secondary | ICD-10-CM | POA: Insufficient documentation

## 2018-03-18 DIAGNOSIS — K5730 Diverticulosis of large intestine without perforation or abscess without bleeding: Secondary | ICD-10-CM | POA: Diagnosis not present

## 2018-03-18 DIAGNOSIS — I509 Heart failure, unspecified: Secondary | ICD-10-CM | POA: Diagnosis not present

## 2018-03-18 DIAGNOSIS — Z79899 Other long term (current) drug therapy: Secondary | ICD-10-CM | POA: Diagnosis not present

## 2018-03-18 DIAGNOSIS — Z794 Long term (current) use of insulin: Secondary | ICD-10-CM | POA: Diagnosis not present

## 2018-03-18 DIAGNOSIS — J449 Chronic obstructive pulmonary disease, unspecified: Secondary | ICD-10-CM | POA: Insufficient documentation

## 2018-03-18 DIAGNOSIS — I2510 Atherosclerotic heart disease of native coronary artery without angina pectoris: Secondary | ICD-10-CM | POA: Diagnosis not present

## 2018-03-18 DIAGNOSIS — Z87891 Personal history of nicotine dependence: Secondary | ICD-10-CM

## 2018-03-18 DIAGNOSIS — E119 Type 2 diabetes mellitus without complications: Secondary | ICD-10-CM | POA: Insufficient documentation

## 2018-03-18 DIAGNOSIS — E785 Hyperlipidemia, unspecified: Secondary | ICD-10-CM | POA: Diagnosis not present

## 2018-03-18 DIAGNOSIS — Z5112 Encounter for antineoplastic immunotherapy: Secondary | ICD-10-CM | POA: Diagnosis not present

## 2018-03-18 DIAGNOSIS — Z9981 Dependence on supplemental oxygen: Secondary | ICD-10-CM

## 2018-03-18 DIAGNOSIS — M810 Age-related osteoporosis without current pathological fracture: Secondary | ICD-10-CM | POA: Diagnosis not present

## 2018-03-18 DIAGNOSIS — Z902 Acquired absence of lung [part of]: Secondary | ICD-10-CM | POA: Diagnosis not present

## 2018-03-18 DIAGNOSIS — R5383 Other fatigue: Secondary | ICD-10-CM | POA: Diagnosis not present

## 2018-03-18 DIAGNOSIS — C3490 Malignant neoplasm of unspecified part of unspecified bronchus or lung: Secondary | ICD-10-CM

## 2018-03-18 DIAGNOSIS — Z7189 Other specified counseling: Secondary | ICD-10-CM

## 2018-03-18 DIAGNOSIS — G20 Parkinson's disease: Secondary | ICD-10-CM | POA: Insufficient documentation

## 2018-03-18 DIAGNOSIS — I252 Old myocardial infarction: Secondary | ICD-10-CM

## 2018-03-18 DIAGNOSIS — J322 Chronic ethmoidal sinusitis: Secondary | ICD-10-CM | POA: Insufficient documentation

## 2018-03-18 DIAGNOSIS — K449 Diaphragmatic hernia without obstruction or gangrene: Secondary | ICD-10-CM | POA: Insufficient documentation

## 2018-03-18 LAB — CBC WITH DIFFERENTIAL/PLATELET
Basophils Absolute: 0 10*3/uL (ref 0.0–0.1)
Basophils Relative: 0 %
Eosinophils Absolute: 0 10*3/uL (ref 0.0–0.7)
Eosinophils Relative: 0 %
HCT: 36.4 % (ref 36.0–46.0)
Hemoglobin: 11.1 g/dL — ABNORMAL LOW (ref 12.0–15.0)
Lymphocytes Relative: 18 %
Lymphs Abs: 1.8 10*3/uL (ref 0.7–4.0)
MCH: 28.2 pg (ref 26.0–34.0)
MCHC: 30.5 g/dL (ref 30.0–36.0)
MCV: 92.6 fL (ref 78.0–100.0)
Monocytes Absolute: 1.1 10*3/uL — ABNORMAL HIGH (ref 0.1–1.0)
Monocytes Relative: 11 %
Neutro Abs: 6.9 10*3/uL (ref 1.7–7.7)
Neutrophils Relative %: 71 %
Platelets: 267 10*3/uL (ref 150–400)
RBC: 3.93 MIL/uL (ref 3.87–5.11)
RDW: 15.5 % (ref 11.5–15.5)
WBC: 9.9 10*3/uL (ref 4.0–10.5)

## 2018-03-18 LAB — COMPREHENSIVE METABOLIC PANEL
ALT: <5 U/L — ABNORMAL LOW (ref 14–54)
AST: 15 U/L (ref 15–41)
Albumin: 3.5 g/dL (ref 3.5–5.0)
Alkaline Phosphatase: 51 U/L (ref 38–126)
Anion gap: 11 (ref 5–15)
BUN: 24 mg/dL — ABNORMAL HIGH (ref 6–20)
CO2: 26 mmol/L (ref 22–32)
Calcium: 9.3 mg/dL (ref 8.9–10.3)
Chloride: 103 mmol/L (ref 101–111)
Creatinine, Ser: 1.47 mg/dL — ABNORMAL HIGH (ref 0.44–1.00)
GFR calc Af Amer: 37 mL/min — ABNORMAL LOW (ref >60)
GFR calc non Af Amer: 32 mL/min — ABNORMAL LOW (ref >60)
Glucose, Bld: 146 mg/dL — ABNORMAL HIGH (ref 65–99)
Potassium: 4 mmol/L (ref 3.5–5.1)
Sodium: 140 mmol/L (ref 135–145)
Total Bilirubin: 0.3 mg/dL (ref 0.3–1.2)
Total Protein: 6.7 g/dL (ref 6.5–8.1)

## 2018-03-18 LAB — TSH: TSH: 1.707 u[IU]/mL (ref 0.350–4.500)

## 2018-03-18 MED ORDER — SODIUM CHLORIDE 0.9% FLUSH
10.0000 mL | INTRAVENOUS | Status: DC | PRN
  Administered 2018-03-18: 10 mL
  Filled 2018-03-18: qty 10

## 2018-03-18 MED ORDER — HEPARIN SOD (PORK) LOCK FLUSH 100 UNIT/ML IV SOLN
500.0000 [IU] | Freq: Once | INTRAVENOUS | Status: AC | PRN
  Administered 2018-03-18: 500 [IU]

## 2018-03-18 MED ORDER — SODIUM CHLORIDE 0.9 % IV SOLN
1200.0000 mg | Freq: Once | INTRAVENOUS | Status: AC
  Administered 2018-03-18: 1200 mg via INTRAVENOUS
  Filled 2018-03-18: qty 20

## 2018-03-18 MED ORDER — SODIUM CHLORIDE 0.9 % IV SOLN
Freq: Once | INTRAVENOUS | Status: AC
  Administered 2018-03-18: 500 mL via INTRAVENOUS

## 2018-03-18 NOTE — Progress Notes (Signed)
[place] [place], [place] [address]   CLINIC:  Medical Oncology/Hematology  PCP:  Rosita Fire, MD [place] [address] [phone]   REASON FOR VISIT:  Follow-up for stage IV lung cancer.  CURRENT THERAPY: Tecentriq every 3 weeks.  BRIEF ONCOLOGIC HISTORY:    Adenocarcinoma of lung ([place])   07/07/2008 Surgery    Performed in [place], [place].  Consistent with poorly differentiated adenocarcinoma.      02/27/2014 Imaging    CT of chest demonstrating a RLL solid nodular component measuring 9 mm at the site of previous ground-glass opacity. Slight increase in size of an anterior mediastinal node measuring 1.1 cm with stable 1 cm pretracheal lymph node. Consider PET      03/20/2014 Imaging    PET scan- No hypermetabolic pulmonary lesions to suggest recurrent or metastatic pulmonary disease.  Weakly positive left internal mammary lymph node and left axillary lymph node (? inflammatory process involving the left breast).      11/07/2015 Imaging    CT chest- 1. No evidence of metastatic involvement of the lungs. The previously noted small noncalcified lung nodules are stable. 2. Vague focal opacity in the right lower lobe may represent scarring and is stable. Continued followup however is recommended in 1 year.      01/08/2017 Imaging    CT chest- 1. New nodules along a thick walled bullous lesion in the right lower lobe, suspicious for malignancy. 2. New fairly large region of confluent ground-glass opacity posteriorly in the remaining left lung, low grade adenocarcinoma is not excluded and there is mild new nodularity along the upper margin of this process. 3. Stable biapical pleuroparenchymal scarring with several stable nodules in the left lung. 4. Borderline adenopathy in the mediastinum, essentially stable from prior. 5. Coronary, aortic arch, and branch vessel atherosclerotic vascular disease. 6. Small hiatal  hernia. 7. Hypodense left thyroid lesion. If not previously worked up, thyroid ultrasound could be employed.      01/21/2017 PET scan    1. New hypermetabolic adenopathy in the mediastinum and hila, with progressive hypermetabolic ground-glass density posteriorly in the left lower lobe with faint nodular components, and some low-grade hypermetabolic activity in ground-glass opacity dependently in the right upper lobe. Although the appearance is concerning for malignancy potentially with a lymphangitic component in the left lower lobe, the unusual configuration in the appearance of the ground-glass opacities could conceivably represent an atypical granulomatous infectious process as an alternative. This is not classic obvious recurrence with well-defined mass or nodules. 2. There is some ill-defined clustered nodularity in the right lower lobe only faintly metabolic, maximum SUV 3.0. 3. Overall I believe that this process require surveillance to differentiate an inflammatory/infectious condition from recurrent malignancy. The adenopathy is certainly concerning. 4. No findings of malignancy in the neck, abdomen/pelvis, or skeleton. 5. Sigmoid colon diverticulosis. 6. Emphysema. 7. Chronic ethmoid sinusitis.      03/25/2017 Imaging    CT chest- Stable 5.9 cm left lower lobe ground-glass opacity, hypermetabolic on prior PET, worrisome for primary bronchogenic neoplasm such as low-grade adenocarcinoma.  Stable 2.2 cm cavitary lesion in the central right lower lobe with surrounding hypermetabolic nodularity measuring up to 1.8 cm, worrisome for primary bronchogenic neoplasm such as squamous cell carcinoma.  Hypermetabolic mediastinal lymph nodes measuring up to 11 mm short axis, as above. Bilateral hilar regions are hypermetabolic on PET but poorly evaluated on unenhanced CT.  Emphysema and aortic atherosclerosis.  04/23/2017 Procedure    Bronchoscopy and EBUS with  mediastinal lymph node aspirations by Dr. Roxan Hockey.      04/26/2017 Pathology Results    1. Lung, biopsy, Left upper lobe - ADENOCARCINOMA. - SEE COMMENT. 2. Lung, biopsy, Right lower lobe - ADENOCARCINOMA. - SEE COMMENT. 3. Lung, biopsy, Right lower lobe target 2 - ATYPICAL CELLS. - SEE COMMENT.      04/26/2017 Relapse/Recurrence         05/14/2017 Pathology Results    PDL1 NEGATIVE.  TPS 0%.      05/19/2017 Pathology Results    [employer] testing- MS-stable.  TMB 6 Muts/Mb (Intermediate).  STK11 E293*.  Negative for EGFR, KRAS, ALK, BRAF, MET, RET, ERBB2, and ROS1.  Potential treatment options: Atezolizumab/Durvalumab/Nivolumab/Pembrolizumab for TMB score.      07/27/2017 Imaging    CT C/A/P: IMPRESSION: Resolution of right lower lobe lesion and nodules.  Unchanged ill-defined nonspecific left lower lobe ground-glass opacity and stable mildly prominent mediastinal lymph nodes.  No evidence of metastatic disease or acute abnormality within the abdomen or pelvis.  Aortic Atherosclerosis (ICD10-I70.0) and Emphysema (ICD10-J43.9).      10/08/2017 Imaging    CT C/A/P: Similar appearance of the chest with an upper normal sized paratracheal lymph node, confluent ground-glass opacity in the left lower lobe, and severe centrilobular emphysema, but no findings of progressive or definite active malignancy.        CANCER STAGING: Cancer Staging Adenocarcinoma of lung [facility]) Staging form: Lung, AJCC 7th Edition - Clinical: Stage IA - Signed by Baird Cancer, PA-C on 02/27/2014 - Pathologic stage from 04/26/2017: Stage IV (T2b(2), N3, M1a) - Signed by Baird Cancer, PA-C on 04/26/2017    INTERVAL HISTORY:  Ms. Moffet 84 y.o. female returns for routine follow-up and consideration for next cycle of chemotherapy.   She denies any immunotherapy related side effects including diarrhea, severe cough or shortness of breath.  She denies any skin rashes or itching.  She  denies any headaches or lightheadedness.  Her appetite has been good.  Her COPD is also stable.  She is using inhalers as prescribed.  Her Parkinson's is also under control.  She denies any recent infections or hospitalizations.  REVIEW OF SYSTEMS:  Review of Systems  Constitutional: Positive for fatigue.  Respiratory: Positive for shortness of breath.   All other systems reviewed and are negative.    PAST MEDICAL/SURGICAL HISTORY:  Past Medical History:  Diagnosis Date  . Adenocarcinoma of lung ([place])    Left lung 2009, resected  . Anginal pain ([place])   . Arthritis   . Back pain   . CHF (congestive heart failure) ([place])   . COPD (chronic obstructive pulmonary disease) ([place])   . Diverticulitis   . Dyslipidemia   . Essential hypertension   . H/O ventral hernia   . Non-obstructive CAD    a. 04/2015 NSTEMI/Cath: LAD 10p, LCX 57m RCA 32m20d, EF 35-40 w/ apical ballooning.  . On home O2    3L N/C  . Osteoporosis   . Osteoporosis 11/04/2015   Managed by Dr. FaLegrand Rams . Parkinson's disease ([facility]  . Shortness of breath   . Takotsubo cardiomyopathy    a. 04/2015 Echo: EF 45-50%, mid-dist anterior/apical/inferoapical HK w/ hyperdynamic base. Gr 1 DD, mild AI, mild-mod MR, triv TR, PASP 4859m;  b. 04/2015 LV gram: Ef 35-40% w/ apical ballooning.  . Type II diabetes mellitus ([facility] . Ventricular bigeminy    a. 04/2015 in  setting of NSTEMI/Takotsubo.   Past Surgical History:  Procedure Laterality Date  . ABDOMINAL HYSTERECTOMY    . CARDIAC CATHETERIZATION N/A 04/17/2015   Procedure: Left Heart Cath and Coronary Angiography;  Surgeon: Troy Sine, MD;  Location: [facility];  Service: Cardiovascular;  Laterality: N/A;  . CHOLECYSTECTOMY    . COLONOSCOPY N/A 09/18/2014   Procedure: COLONOSCOPY;  Surgeon: Danie Binder, MD;  [facility];  Service: Endoscopy;  Laterality: N/A;  8:30 AM - moved to 10:30 Rosendo Gros to notify pt  . ECTOPIC PREGNANCY SURGERY    .  INCISIONAL HERNIA REPAIR N/A 08/26/2013   Procedure: HERNIA REPAIR INCISIONAL WITH MESH;  Surgeon: Jamesetta So, MD;  Location: [facility];  Service: General;  Laterality: N/A;  . IR FLUORO GUIDE PORT INSERTION RIGHT  04/30/2017  . IR US GUIDE VASC ACCESS RIGHT  04/30/2017  . LUNG CANCER SURGERY    . VIDEO BRONCHOSCOPY WITH ENDOBRONCHIAL NAVIGATION N/A 04/23/2017   Procedure: VIDEO BRONCHOSCOPY WITH ENDOBRONCHIAL NAVIGATION;  Surgeon: Melrose Nakayama, MD;  Location: [facility];  Service: Thoracic;  Laterality: N/A;  . VIDEO BRONCHOSCOPY WITH ENDOBRONCHIAL ULTRASOUND N/A 04/23/2017   Procedure: VIDEO BRONCHOSCOPY WITH ENDOBRONCHIAL ULTRASOUND;  Surgeon: Melrose Nakayama, MD;  Location: [facility];  Service: Thoracic;  Laterality: N/A;     SOCIAL HISTORY:  Social History   Socioeconomic History  . Marital status: Widowed    Spouse name: Not on file  . Number of children: Not on file  . Years of education: Not on file  . Highest education level: Not on file  Occupational History  . Not on file  Social Needs  . Financial resource strain: Not on file  . Food insecurity:    Worry: Not on file    Inability: Not on file  . Transportation needs:    Medical: Not on file    Non-medical: Not on file  Tobacco Use  . Smoking status: Former Smoker    Years: 20.00    Types: Cigarettes    Last attempt to quit: 08/13/2006    Years since quitting: 11.6  . Smokeless tobacco: Never Used  Substance and Sexual Activity  . Alcohol use: No    Alcohol/week: 0.0 oz  . Drug use: No  . Sexual activity: Not Currently    Birth control/protection: Surgical  Lifestyle  . Physical activity:    Days per week: Not on file    Minutes per session: Not on file  . Stress: Not on file  Relationships  . Social connections:    Talks on phone: Not on file    Gets together: Not on file    Attends religious service: Not on file    Active member of club or organization: Not on file    Attends meetings of clubs or  organizations: Not on file    Relationship status: Not on file  . Intimate partner violence:    Fear of current or ex partner: Not on file    Emotionally abused: Not on file    Physically abused: Not on file    Forced sexual activity: Not on file  Other Topics Concern  . Not on file  Social History Narrative  . Not on file    FAMILY HISTORY:  Family History  Problem Relation Age of Onset  . Diabetes Mother   . Hypertension Mother   . Diabetes Father   . Asthma Unknown   . Cancer Unknown   .  Heart attack Sister        X2  . Colon cancer Neg Hx     CURRENT MEDICATIONS:  Outpatient Encounter Medications as of 03/18/2018  Medication Sig Note  . albuterol (PROAIR HFA) 108 (90 BASE) MCG/ACT inhaler Inhale 2 puffs into the lungs every 4 (four) hours as needed for wheezing or shortness of breath.   Marland Kitchen alendronate (FOSAMAX) 70 MG tablet Take 70 mg by mouth every Friday.    Marland Kitchen aspirin EC 81 MG tablet Take 81 mg by mouth daily.   Huey Bienenstock (TECENTRIQ IV) Inject into the vein. Every 3 weeks 02/04/2018: Administered on 02/04/2018  . atorvastatin (LIPITOR) 40 MG tablet Take 40 mg by mouth at bedtime.    . B-D ULTRAFINE III SHORT PEN 31G X 8 MM MISC See admin instructions.   . benzonatate (TESSALON) 100 MG capsule Take 1 capsule (100 mg total) by mouth every 8 (eight) hours.   Marland Kitchen buPROPion (WELLBUTRIN SR) 150 MG 12 hr tablet Take 150 mg by mouth 2 (two) times daily.    . carbidopa-levodopa (SINEMET IR) 25-100 MG tablet Take 1 tablet by mouth 2 (two) times daily.   . cholecalciferol (VITAMIN D) 1000 units tablet Take 1,000 Units by mouth daily.   . clotrimazole-betamethasone (LOTRISONE) cream Apply 1 application topically 2 (two) times daily. 12/04/2017: Patient still has if needed but has not taken recently  . doxycycline (VIBRAMYCIN) 100 MG capsule Take 1 capsule (100 mg total) by mouth 2 (two) times daily.   . furosemide (LASIX) 40 MG tablet Take 1 tablet (40 mg total) by mouth daily. Pt  is able to take one additional tablet daily for weight increases of 3lbs in a day or 5lbs in a week. Resume on 10/20   . gabapentin (NEURONTIN) 300 MG capsule Take 600 mg by mouth at bedtime.    . Garlic 299 MG TABS Take 300 mg by mouth daily.   Marland Kitchen guaiFENesin (MUCINEX) 600 MG 12 hr tablet Take 1 tablet (600 mg total) by mouth 2 (two) times daily.   Marland Kitchen ipratropium-albuterol (DUONEB) 0.5-2.5 (3) MG/3ML SOLN Inhale 3 mLs into the lungs every 6 (six) hours as needed (for wheezing/shortness of breath).    Marland Kitchen KLOR-CON 10 10 MEQ tablet TAKE 1 TABLET (10 MEQ TOTAL) BY MOUTH DAILY. *FURTHER REFILLS NEED TO BE AUTHORIZED BY PCP* (Patient taking differently: TAKE 1 TABLET (10 MEQ TOTAL) BY MOUTH DAILY.)   . LANTUS SOLOSTAR 100 UNIT/ML Solostar Pen Inject 15 Units into the skin at bedtime.    . lidocaine-prilocaine (EMLA) cream Apply to affected area once (Patient taking differently: Apply 1 application topically daily as needed. )   . linagliptin (TRADJENTA) 5 MG TABS tablet Take 5 mg by mouth daily.   Marland Kitchen loratadine (CLARITIN) 10 MG tablet Take 10 mg by mouth daily.   . meclizine (ANTIVERT) 25 MG tablet Take 25 mg by mouth daily.    . metoprolol succinate (TOPROL-XL) 25 MG 24 hr tablet Take 1 tablet (25 mg total) by mouth daily.   . mometasone-formoterol (DULERA) 200-5 MCG/ACT AERO Inhale 2 puffs into the lungs 2 (two) times daily.   . nitroGLYCERIN (NITROSTAT) 0.4 MG SL tablet Place 0.4 mg under the tongue every 5 (five) minutes as needed for chest pain.   . Omega-3 Fatty Acids (FISH OIL PO) Take 1 capsule by mouth daily.   Marland Kitchen omeprazole (PRILOSEC) 40 MG capsule Take 40 mg by mouth daily.   . ondansetron (ZOFRAN) 8 MG tablet Take 1  tablet (8 mg total) by mouth 2 (two) times daily as needed (Nausea or vomiting).   . OXYGEN Inhale 3 L into the lungs continuous.   . polyethylene glycol powder (GLYCOLAX/MIRALAX) powder Take 17 g by mouth daily as needed.   . predniSONE (DELTASONE) 10 MG tablet Take '60mg'$  po daily  for 2 days then '40mg'$  daily for 2 days then '30mg'$  daily for 2 days then '20mg'$  daily for 2 days then '10mg'$  daily for 2 days then stop   . prochlorperazine (COMPAZINE) 10 MG tablet Take 1 tablet (10 mg total) by mouth every 6 (six) hours as needed (Nausea or vomiting).   . pyridoxine (B-6) 100 MG tablet Take 100 mg by mouth daily.    Marland Kitchen rOPINIRole (REQUIP) 0.5 MG tablet Take 0.5 mg by mouth 2 (two) times daily.    . sodium chloride (OCEAN) 0.65 % SOLN nasal spray Place 1 spray into both nostrils every 3 (three) hours as needed for congestion.    . traMADol (ULTRAM) 50 MG tablet Take 50 mg by mouth every 12 (twelve) hours as needed for moderate pain.  12/04/2017: Patient does not take often as it does not seem to help with pain  . zolpidem (AMBIEN) 5 MG tablet Take 5 mg by mouth at bedtime as needed for sleep.    No facility-administered encounter medications on file as of 03/18/2018.     ALLERGIES:  Allergies  Allergen Reactions  . Lisinopril Cough     PHYSICAL EXAM:  ECOG Performance status: 1  Vitals:   03/18/18 1005  BP: (!) 142/51  Pulse: 73  Resp: 18  Temp: 97.7 F (36.5 C)  SpO2: 97%   There were no vitals filed for this visit.    LABORATORY DATA:  I have reviewed the labs as listed.  CBC    Component Value Date/Time   WBC 9.9 03/18/2018 1030   RBC 3.93 03/18/2018 1030   HGB 11.1 (L) 03/18/2018 1030   HCT 36.4 03/18/2018 1030   PLT 267 03/18/2018 1030   MCV 92.6 03/18/2018 1030   MCH 28.2 03/18/2018 1030   MCHC 30.5 03/18/2018 1030   RDW 15.5 03/18/2018 1030   LYMPHSABS 1.8 03/18/2018 1030   MONOABS 1.1 (H) 03/18/2018 1030   EOSABS 0.0 03/18/2018 1030   BASOSABS 0.0 03/18/2018 1030   CMP Latest Ref Rng & Units 03/18/2018 02/25/2018 02/07/2018  Glucose 65 - 99 mg/dL 146(H) 153(H) 190(H)  BUN 6 - 20 mg/dL 24(H) 16 36(H)  Creatinine 0.44 - 1.00 mg/dL 1.47(H) 1.31(H) 1.33(H)  Sodium 135 - 145 mmol/L 140 136 139  Potassium 3.5 - 5.1 mmol/L 4.0 4.1 4.8  Chloride 101 -  111 mmol/L 103 99(L) 105  CO2 22 - 32 mmol/L '26 26 27  '$ Calcium 8.9 - 10.3 mg/dL 9.3 8.9 9.9  Total Protein 6.5 - 8.1 g/dL 6.7 6.2(L) -  Total Bilirubin 0.3 - 1.2 mg/dL 0.3 0.8 -  Alkaline Phos 38 - 126 U/L 51 41 -  AST 15 - 41 U/L 15 21 -  ALT 14 - 54 U/L <5(L) 5(L) -       DIAGNOSTIC IMAGING:  I have reviewed her CT scan of the chest, abdomen and pelvis independently and agree with radiology report.  There was no sign of lung nodules or other metastatic disease.     ASSESSMENT & PLAN:   Adenocarcinoma of lung ([place]) 1.Stage IV adenocarcinoma of the lung: She has bilateral lung nodules and mediastinal adenopathy.  She is currently on  Tecentriq which she is tolerating very well.  She has tolerated last cycle very well.  She did not experience any immunotherapy related side effects.  I have discussed the results of the CT scan of the chest, abdomen and pelvis which did not show any lung nodules.  No other evidence of metastatic disease in the abdomen and pelvis.  She may proceed with Tecentriq until disease progression.  We will see her back in 6 weeks.  2.  COPD: Her last admission to the hospital with COPD exacerbation was from 02/05/2018 through 02/08/2018.  She continues to be on 3 L nasal cannula.  Her breathing status has improved since the discharge.  3.  Diabetes: She will continue Tradjenta and Lantus.  4.  Parkinson's disease: She is continuing Sinemet.  This is well controlled.      Orders placed this encounter:  Orders Placed This Encounter  Procedures  . Comprehensive metabolic panel  . CBC with Differential  . CBC with Differential  . Comprehensive metabolic panel  . TSH      Derek Jack, [place] [phone]

## 2018-03-18 NOTE — Progress Notes (Signed)
To treatment area after oncology follow up visit.    Patient tolerated treatment with no complaints voiced.  Port site clean and dry with no bruising or swelling noted at site.  No complaints of pain with flush.  Band aid applied.  VSS with discharge and left via wheelchair with family.  No s/s of distress noted.

## 2018-03-18 NOTE — Patient Instructions (Signed)
[facility] Discharge Instructions for Patients Receiving Chemotherapy  Today you received the following chemotherapy agents tecentriq.   If you develop nausea and vomiting that is not controlled by your nausea medication, call the clinic.   BELOW ARE SYMPTOMS THAT SHOULD BE REPORTED IMMEDIATELY:  *FEVER GREATER THAN 100.5 F  *CHILLS WITH OR WITHOUT FEVER  NAUSEA AND VOMITING THAT IS NOT CONTROLLED WITH YOUR NAUSEA MEDICATION  *UNUSUAL SHORTNESS OF BREATH  *UNUSUAL BRUISING OR BLEEDING  TENDERNESS IN MOUTH AND THROAT WITH OR WITHOUT PRESENCE OF ULCERS  *URINARY PROBLEMS  *BOWEL PROBLEMS  UNUSUAL RASH Items with * indicate a potential emergency and should be followed up as soon as possible.  Feel free to call the clinic should you have any questions or concerns. The clinic phone number is [phone].  Please show the [place] at check-in to the Emergency Department and triage nurse.

## 2018-03-18 NOTE — Assessment & Plan Note (Signed)
1.Stage IV adenocarcinoma of the lung: She has bilateral lung nodules and mediastinal adenopathy.  She is currently on Tecentriq which she is tolerating very well.  She has tolerated last cycle very well.  She did not experience any immunotherapy related side effects.  I have discussed the results of the CT scan of the chest, abdomen and pelvis which did not show any lung nodules.  No other evidence of metastatic disease in the abdomen and pelvis.  She may proceed with Tecentriq until disease progression.  We will see her back in 6 weeks.  2.  COPD: Her last admission to the hospital with COPD exacerbation was from 02/05/2018 through 02/08/2018.  She continues to be on 3 L nasal cannula.  Her breathing status has improved since the discharge.  3.  Diabetes: She will continue Tradjenta and Lantus.  4.  Parkinson's disease: She is continuing Sinemet.  This is well controlled.

## 2018-03-25 DIAGNOSIS — J449 Chronic obstructive pulmonary disease, unspecified: Secondary | ICD-10-CM | POA: Diagnosis not present

## 2018-04-07 DIAGNOSIS — I10 Essential (primary) hypertension: Secondary | ICD-10-CM | POA: Diagnosis not present

## 2018-04-07 DIAGNOSIS — Z85118 Personal history of other malignant neoplasm of bronchus and lung: Secondary | ICD-10-CM | POA: Diagnosis not present

## 2018-04-07 DIAGNOSIS — J449 Chronic obstructive pulmonary disease, unspecified: Secondary | ICD-10-CM | POA: Diagnosis not present

## 2018-04-07 DIAGNOSIS — J9611 Chronic respiratory failure with hypoxia: Secondary | ICD-10-CM | POA: Diagnosis not present

## 2018-04-08 ENCOUNTER — Inpatient Hospital Stay (HOSPITAL_COMMUNITY): Payer: Medicare Other

## 2018-04-08 ENCOUNTER — Encounter (HOSPITAL_COMMUNITY): Payer: Self-pay

## 2018-04-08 ENCOUNTER — Inpatient Hospital Stay (HOSPITAL_COMMUNITY): Payer: Medicare Other | Attending: Internal Medicine

## 2018-04-08 VITALS — BP 158/65 | HR 76 | Temp 97.6°F | Resp 20 | Wt 179.5 lb

## 2018-04-08 DIAGNOSIS — E119 Type 2 diabetes mellitus without complications: Secondary | ICD-10-CM | POA: Diagnosis not present

## 2018-04-08 DIAGNOSIS — Z7982 Long term (current) use of aspirin: Secondary | ICD-10-CM | POA: Insufficient documentation

## 2018-04-08 DIAGNOSIS — C3412 Malignant neoplasm of upper lobe, left bronchus or lung: Secondary | ICD-10-CM | POA: Insufficient documentation

## 2018-04-08 DIAGNOSIS — Z87891 Personal history of nicotine dependence: Secondary | ICD-10-CM | POA: Diagnosis not present

## 2018-04-08 DIAGNOSIS — J449 Chronic obstructive pulmonary disease, unspecified: Secondary | ICD-10-CM | POA: Insufficient documentation

## 2018-04-08 DIAGNOSIS — R0602 Shortness of breath: Secondary | ICD-10-CM | POA: Diagnosis not present

## 2018-04-08 DIAGNOSIS — I509 Heart failure, unspecified: Secondary | ICD-10-CM | POA: Insufficient documentation

## 2018-04-08 DIAGNOSIS — I110 Hypertensive heart disease with heart failure: Secondary | ICD-10-CM | POA: Diagnosis not present

## 2018-04-08 DIAGNOSIS — G20 Parkinson's disease: Secondary | ICD-10-CM | POA: Diagnosis not present

## 2018-04-08 DIAGNOSIS — Z9981 Dependence on supplemental oxygen: Secondary | ICD-10-CM | POA: Diagnosis not present

## 2018-04-08 DIAGNOSIS — Z794 Long term (current) use of insulin: Secondary | ICD-10-CM | POA: Diagnosis not present

## 2018-04-08 DIAGNOSIS — R5383 Other fatigue: Secondary | ICD-10-CM | POA: Diagnosis not present

## 2018-04-08 DIAGNOSIS — Z7189 Other specified counseling: Secondary | ICD-10-CM

## 2018-04-08 DIAGNOSIS — E785 Hyperlipidemia, unspecified: Secondary | ICD-10-CM | POA: Diagnosis not present

## 2018-04-08 DIAGNOSIS — Z5112 Encounter for antineoplastic immunotherapy: Secondary | ICD-10-CM | POA: Insufficient documentation

## 2018-04-08 DIAGNOSIS — G2581 Restless legs syndrome: Secondary | ICD-10-CM | POA: Diagnosis not present

## 2018-04-08 DIAGNOSIS — Z79899 Other long term (current) drug therapy: Secondary | ICD-10-CM | POA: Diagnosis not present

## 2018-04-08 DIAGNOSIS — I252 Old myocardial infarction: Secondary | ICD-10-CM | POA: Insufficient documentation

## 2018-04-08 DIAGNOSIS — C3431 Malignant neoplasm of lower lobe, right bronchus or lung: Secondary | ICD-10-CM | POA: Insufficient documentation

## 2018-04-08 DIAGNOSIS — C3490 Malignant neoplasm of unspecified part of unspecified bronchus or lung: Secondary | ICD-10-CM

## 2018-04-08 LAB — CBC WITH DIFFERENTIAL/PLATELET
Basophils Absolute: 0 10*3/uL (ref 0.0–0.1)
Basophils Relative: 0 %
Eosinophils Absolute: 0.2 10*3/uL (ref 0.0–0.7)
Eosinophils Relative: 2 %
HCT: 36.5 % (ref 36.0–46.0)
Hemoglobin: 11.1 g/dL — ABNORMAL LOW (ref 12.0–15.0)
Lymphocytes Relative: 12 %
Lymphs Abs: 1.1 10*3/uL (ref 0.7–4.0)
MCH: 27.6 pg (ref 26.0–34.0)
MCHC: 30.4 g/dL (ref 30.0–36.0)
MCV: 90.8 fL (ref 78.0–100.0)
Monocytes Absolute: 0.9 10*3/uL (ref 0.1–1.0)
Monocytes Relative: 10 %
Neutro Abs: 7.2 10*3/uL (ref 1.7–7.7)
Neutrophils Relative %: 76 %
Platelets: 237 10*3/uL (ref 150–400)
RBC: 4.02 MIL/uL (ref 3.87–5.11)
RDW: 15.1 % (ref 11.5–15.5)
WBC: 9.5 10*3/uL (ref 4.0–10.5)

## 2018-04-08 LAB — COMPREHENSIVE METABOLIC PANEL
ALT: 5 U/L — ABNORMAL LOW (ref 14–54)
AST: 16 U/L (ref 15–41)
Albumin: 3.7 g/dL (ref 3.5–5.0)
Alkaline Phosphatase: 60 U/L (ref 38–126)
Anion gap: 9 (ref 5–15)
BUN: 19 mg/dL (ref 6–20)
CO2: 27 mmol/L (ref 22–32)
Calcium: 9.2 mg/dL (ref 8.9–10.3)
Chloride: 102 mmol/L (ref 101–111)
Creatinine, Ser: 1.38 mg/dL — ABNORMAL HIGH (ref 0.44–1.00)
GFR calc Af Amer: 40 mL/min — ABNORMAL LOW (ref >60)
GFR calc non Af Amer: 35 mL/min — ABNORMAL LOW (ref >60)
Glucose, Bld: 146 mg/dL — ABNORMAL HIGH (ref 65–99)
Potassium: 4.3 mmol/L (ref 3.5–5.1)
Sodium: 138 mmol/L (ref 135–145)
Total Bilirubin: 0.7 mg/dL (ref 0.3–1.2)
Total Protein: 6.7 g/dL (ref 6.5–8.1)

## 2018-04-08 LAB — TSH: TSH: 1.236 u[IU]/mL (ref 0.350–4.500)

## 2018-04-08 MED ORDER — SODIUM CHLORIDE 0.9% FLUSH
10.0000 mL | INTRAVENOUS | Status: DC | PRN
  Administered 2018-04-08: 10 mL
  Filled 2018-04-08: qty 10

## 2018-04-08 MED ORDER — SODIUM CHLORIDE 0.9 % IV SOLN
Freq: Once | INTRAVENOUS | Status: AC
  Administered 2018-04-08: 12:00:00 via INTRAVENOUS

## 2018-04-08 MED ORDER — SODIUM CHLORIDE 0.9 % IV SOLN
1200.0000 mg | Freq: Once | INTRAVENOUS | Status: AC
  Administered 2018-04-08: 1200 mg via INTRAVENOUS
  Filled 2018-04-08: qty 20

## 2018-04-08 MED ORDER — HEPARIN SOD (PORK) LOCK FLUSH 100 UNIT/ML IV SOLN
500.0000 [IU] | Freq: Once | INTRAVENOUS | Status: AC | PRN
  Administered 2018-04-08: 500 [IU]

## 2018-04-08 NOTE — Progress Notes (Signed)
Angela Lawson tolerated Tecentriq infusion well without complaints or incident. Labs reviewed prior to administering this medication. VSS upon discharge. Pt discharged via wheelchair in satisfactory condition accompanied by her daughter

## 2018-04-08 NOTE — Patient Instructions (Signed)
[facility] Discharge Instructions for Patients Receiving Chemotherapy   Beginning January 23rd 2017 lab work for the [facility] will be done in the  Main lab at [facility] on 1st floor. If you have a lab appointment with the [facility] please come in thru the  Main Entrance and check in at the main information desk   Today you received the following chemotherapy agents Tecentriq. Follow-up as scheduled. Call clinic for any questions or concerns  To help prevent nausea and vomiting after your treatment, we encourage you to take your nausea medication   If you develop nausea and vomiting, or diarrhea that is not controlled by your medication, call the clinic.  The clinic phone number is [phone]. Office hours are Monday-Friday 8:30am-5:00pm.  BELOW ARE SYMPTOMS THAT SHOULD BE REPORTED IMMEDIATELY:  *FEVER GREATER THAN 101.0 F  *CHILLS WITH OR WITHOUT FEVER  NAUSEA AND VOMITING THAT IS NOT CONTROLLED WITH YOUR NAUSEA MEDICATION  *UNUSUAL SHORTNESS OF BREATH  *UNUSUAL BRUISING OR BLEEDING  TENDERNESS IN MOUTH AND THROAT WITH OR WITHOUT PRESENCE OF ULCERS  *URINARY PROBLEMS  *BOWEL PROBLEMS  UNUSUAL RASH Items with * indicate a potential emergency and should be followed up as soon as possible. If you have an emergency after office hours please contact your primary care physician or go to the nearest emergency department.  Please call the clinic during office hours if you have any questions or concerns.   You may also contact the Patient Navigator at [phone] should you have any questions or need assistance in obtaining follow up care.      Resources For Cancer Patients and their Caregivers ? American Cancer Society: Can assist with transportation, wigs, general needs, runs Look Good Feel Better.        (612) 709-5743 ? Cancer Care: Provides financial assistance, online support groups, medication/co-pay assistance.  1-800-813-HOPE  (561)404-0556) ? [place] Assists [place] cancer patients and their families through emotional , educational and financial support.  902-879-4675 ? [place] DSS Where to apply for food stamps, Medicaid and utility assistance. (901)767-8670 ? [facility]: Transportation to medical appointments. 914 817 8076 ? Social Security Administration: May apply for disability if have a Stage IV cancer. 978-014-6725 918 760 8571 ? [employer], Disability and Transit Services: Assists with nutrition, care and transit needs. 727-104-7732

## 2018-04-14 DIAGNOSIS — E1151 Type 2 diabetes mellitus with diabetic peripheral angiopathy without gangrene: Secondary | ICD-10-CM | POA: Diagnosis not present

## 2018-04-14 DIAGNOSIS — B351 Tinea unguium: Secondary | ICD-10-CM | POA: Diagnosis not present

## 2018-04-14 DIAGNOSIS — E1140 Type 2 diabetes mellitus with diabetic neuropathy, unspecified: Secondary | ICD-10-CM | POA: Diagnosis not present

## 2018-04-14 DIAGNOSIS — L110 Acquired keratosis follicularis: Secondary | ICD-10-CM | POA: Diagnosis not present

## 2018-04-24 DIAGNOSIS — J449 Chronic obstructive pulmonary disease, unspecified: Secondary | ICD-10-CM | POA: Diagnosis not present

## 2018-04-28 NOTE — Progress Notes (Signed)
[place], [place] [address]   CLINIC:  Medical Oncology/Hematology  PCP:  Rosita Fire, MD [place] [address] [phone]   REASON FOR VISIT:  Follow-up for stage IV lung cancer.  CURRENT THERAPY: Tecentriq every 3 weeks.  BRIEF ONCOLOGIC HISTORY:    Adenocarcinoma of lung ([place])   07/07/2008 Surgery    Performed in [place], [place].  Consistent with poorly differentiated adenocarcinoma.      02/27/2014 Imaging    CT of chest demonstrating a RLL solid nodular component measuring 9 mm at the site of previous ground-glass opacity. Slight increase in size of an anterior mediastinal node measuring 1.1 cm with stable 1 cm pretracheal lymph node. Consider PET      03/20/2014 Imaging    PET scan- No hypermetabolic pulmonary lesions to suggest recurrent or metastatic pulmonary disease.  Weakly positive left internal mammary lymph node and left axillary lymph node (? inflammatory process involving the left breast).      11/07/2015 Imaging    CT chest- 1. No evidence of metastatic involvement of the lungs. The previously noted small noncalcified lung nodules are stable. 2. Vague focal opacity in the right lower lobe may represent scarring and is stable. Continued followup however is recommended in 1 year.      01/08/2017 Imaging    CT chest- 1. New nodules along a thick walled bullous lesion in the right lower lobe, suspicious for malignancy. 2. New fairly large region of confluent ground-glass opacity posteriorly in the remaining left lung, low grade adenocarcinoma is not excluded and there is mild new nodularity along the upper margin of this process. 3. Stable biapical pleuroparenchymal scarring with several stable nodules in the left lung. 4. Borderline adenopathy in the mediastinum, essentially stable from prior. 5. Coronary, aortic arch, and branch vessel atherosclerotic vascular disease. 6. Small hiatal  hernia. 7. Hypodense left thyroid lesion. If not previously worked up, thyroid ultrasound could be employed.      01/21/2017 PET scan    1. New hypermetabolic adenopathy in the mediastinum and hila, with progressive hypermetabolic ground-glass density posteriorly in the left lower lobe with faint nodular components, and some low-grade hypermetabolic activity in ground-glass opacity dependently in the right upper lobe. Although the appearance is concerning for malignancy potentially with a lymphangitic component in the left lower lobe, the unusual configuration in the appearance of the ground-glass opacities could conceivably represent an atypical granulomatous infectious process as an alternative. This is not classic obvious recurrence with well-defined mass or nodules. 2. There is some ill-defined clustered nodularity in the right lower lobe only faintly metabolic, maximum SUV 3.0. 3. Overall I believe that this process require surveillance to differentiate an inflammatory/infectious condition from recurrent malignancy. The adenopathy is certainly concerning. 4. No findings of malignancy in the neck, abdomen/pelvis, or skeleton. 5. Sigmoid colon diverticulosis. 6. Emphysema. 7. Chronic ethmoid sinusitis.      03/25/2017 Imaging    CT chest- Stable 5.9 cm left lower lobe ground-glass opacity, hypermetabolic on prior PET, worrisome for primary bronchogenic neoplasm such as low-grade adenocarcinoma.  Stable 2.2 cm cavitary lesion in the central right lower lobe with surrounding hypermetabolic nodularity measuring up to 1.8 cm, worrisome for primary bronchogenic neoplasm such as squamous cell carcinoma.  Hypermetabolic mediastinal lymph nodes measuring up to 11 mm short axis, as above. Bilateral hilar regions are hypermetabolic on PET but poorly evaluated on unenhanced CT.  Emphysema and aortic atherosclerosis.  04/23/2017 Procedure    Bronchoscopy and EBUS with  mediastinal lymph node aspirations by Dr. Roxan Hockey.      04/26/2017 Pathology Results    1. Lung, biopsy, Left upper lobe - ADENOCARCINOMA. - SEE COMMENT. 2. Lung, biopsy, Right lower lobe - ADENOCARCINOMA. - SEE COMMENT. 3. Lung, biopsy, Right lower lobe target 2 - ATYPICAL CELLS. - SEE COMMENT.      04/26/2017 Relapse/Recurrence         05/14/2017 Pathology Results    PDL1 NEGATIVE.  TPS 0%.      05/19/2017 Pathology Results    [employer] testing- MS-stable.  TMB 6 Muts/Mb (Intermediate).  STK11 E293*.  Negative for EGFR, KRAS, ALK, BRAF, MET, RET, ERBB2, and ROS1.  Potential treatment options: Atezolizumab/Durvalumab/Nivolumab/Pembrolizumab for TMB score.      07/27/2017 Imaging    CT C/A/P: IMPRESSION: Resolution of right lower lobe lesion and nodules.  Unchanged ill-defined nonspecific left lower lobe ground-glass opacity and stable mildly prominent mediastinal lymph nodes.  No evidence of metastatic disease or acute abnormality within the abdomen or pelvis.  Aortic Atherosclerosis (ICD10-I70.0) and Emphysema (ICD10-J43.9).      10/08/2017 Imaging    CT C/A/P: Similar appearance of the chest with an upper normal sized paratracheal lymph node, confluent ground-glass opacity in the left lower lobe, and severe centrilobular emphysema, but no findings of progressive or definite active malignancy.        CANCER STAGING: Cancer Staging Adenocarcinoma of lung [facility]) Staging form: Lung, AJCC 7th Edition - Clinical: Stage IA - Signed by Baird Cancer, PA-C on 02/27/2014 - Pathologic stage from 04/26/2017: Stage IV (T2b(2), N3, M1a) - Signed by Baird Cancer, PA-C on 04/26/2017    INTERVAL HISTORY:  Angela Lawson 84 y.o. female returns for routine follow-up and consideration for next cycle of immunotherapy.   Due for next cycle of Tecentriq today.   Here today with her daughter.   She feels more "short-winded" today than her normal.  Denies any  worsening/severe cough.  Remains on O2 via nasal cannula. She is using inhalers and nebulizers as directed.  Denies any diarrhea, N&V, headaches, or vision changes.  Reports occasional sneezing and rhinorrhea. No sore throat. Denies any leg swelling; remains on Lasix daily. She has "jumping legs" at night.    She has a walker at home; she only uses it occasionally.  Denies any falls.     REVIEW OF SYSTEMS:  Review of Systems  Constitutional: Positive for fatigue.  Respiratory: Positive for shortness of breath.   All other systems reviewed and are negative.    PAST MEDICAL/SURGICAL HISTORY:  Past Medical History:  Diagnosis Date  . Adenocarcinoma of lung ([place])    Left lung 2009, resected  . Anginal pain ([place])   . Arthritis   . Back pain   . CHF (congestive heart failure) ([place])   . COPD (chronic obstructive pulmonary disease) ([place])   . Diverticulitis   . Dyslipidemia   . Essential hypertension   . H/O ventral hernia   . Non-obstructive CAD    a. 04/2015 NSTEMI/Cath: LAD 10p, LCX 46m RCA 386m20d, EF 35-40 w/ apical ballooning.  . On home O2    3L N/C  . Osteoporosis   . Osteoporosis 11/04/2015   Managed by Dr. FaLegrand Rams . Parkinson's disease ([place]  . Shortness of breath   . Takotsubo cardiomyopathy    a. 04/2015 Echo: EF 45-50%, mid-dist anterior/apical/inferoapical HK w/ hyperdynamic base. Gr 1 DD, mild AI, mild-mod  MR, triv TR, PASP 71mHg;  b. 04/2015 LV gram: Ef 35-40% w/ apical ballooning.  . Type II diabetes mellitus ([place]   . Ventricular bigeminy    a. 04/2015 in setting of NSTEMI/Takotsubo.   Past Surgical History:  Procedure Laterality Date  . ABDOMINAL HYSTERECTOMY    . CARDIAC CATHETERIZATION N/A 04/17/2015   Procedure: Left Heart Cath and Coronary Angiography;  Surgeon: TTroy Sine MD;  [facility];  Service: Cardiovascular;  Laterality: N/A;  . CHOLECYSTECTOMY    . COLONOSCOPY N/A 09/18/2014   Procedure: COLONOSCOPY;  Surgeon: SDanie Binder MD;  Location: [facility] SU[facility];  Service: Endoscopy;  Laterality: N/A;  8:30 AM - moved to 10:30 -Rosendo Grosto notify pt  . ECTOPIC PREGNANCY SURGERY    . INCISIONAL HERNIA REPAIR N/A 08/26/2013   Procedure: HERNIA REPAIR INCISIONAL WITH MESH;  Surgeon: MJamesetta So MD;  Location: [facility] ORS;  Service: General;  Laterality: N/A;  . IR FLUORO GUIDE PORT INSERTION RIGHT  04/30/2017  . IR [place] VASC ACCESS RIGHT  04/30/2017  . LUNG CANCER SURGERY    . VIDEO BRONCHOSCOPY WITH ENDOBRONCHIAL NAVIGATION N/A 04/23/2017   Procedure: VIDEO BRONCHOSCOPY WITH ENDOBRONCHIAL NAVIGATION;  Surgeon: HMelrose Nakayama MD;  [facility]  Service: Thoracic;  Laterality: N/A;  . VIDEO BRONCHOSCOPY WITH ENDOBRONCHIAL ULTRASOUND N/A 04/23/2017   Procedure: VIDEO BRONCHOSCOPY WITH ENDOBRONCHIAL ULTRASOUND;  Surgeon: HMelrose Nakayama MD;  [facility];  Service: Thoracic;  Laterality: N/A;     SOCIAL HISTORY:  Social History   Socioeconomic History  . Marital status: Widowed    Spouse name: Not on file  . Number of children: Not on file  . Years of education: Not on file  . Highest education level: Not on file  Occupational History  . Not on file  Social Needs  . Financial resource strain: Not on file  . Food insecurity:    Worry: Not on file    Inability: Not on file  . Transportation needs:    Medical: Not on file    Non-medical: Not on file  Tobacco Use  . Smoking status: Former Smoker    Years: 20.00    Types: Cigarettes    Last attempt to quit: 08/13/2006    Years since quitting: 11.7  . Smokeless tobacco: Never Used  Substance and Sexual Activity  . Alcohol use: No    Alcohol/week: 0.0 oz  . Drug use: No  . Sexual activity: Not Currently    Birth control/protection: Surgical  Lifestyle  . Physical activity:    Days per week: Not on file    Minutes per session: Not on file  . Stress: Not on file  Relationships  . Social connections:    Talks on phone: Not on file     Gets together: Not on file    Attends religious service: Not on file    Active member of club or organization: Not on file    Attends meetings of clubs or organizations: Not on file    Relationship status: Not on file  . Intimate partner violence:    Fear of current or ex partner: Not on file    Emotionally abused: Not on file    Physically abused: Not on file    Forced sexual activity: Not on file  Other Topics Concern  . Not on file  Social History Narrative  . Not on file    FAMILY HISTORY:  Family History  Problem Relation Age of Onset  . Diabetes Mother   . Hypertension Mother   . Diabetes Father   . Asthma Unknown   . Cancer Unknown   . Heart attack Sister        X2  . Colon cancer Neg Hx     CURRENT MEDICATIONS:  Outpatient Encounter Medications as of 04/29/2018  Medication Sig Note  . albuterol (PROAIR HFA) 108 (90 BASE) MCG/ACT inhaler Inhale 2 puffs into the lungs every 4 (four) hours as needed for wheezing or shortness of breath.   Marland Kitchen alendronate (FOSAMAX) 70 MG tablet Take 70 mg by mouth every Friday.    Marland Kitchen aspirin EC 81 MG tablet Take 81 mg by mouth daily.   Huey Bienenstock (TECENTRIQ IV) Inject into the vein. Every 3 weeks 02/04/2018: Administered on 02/04/2018  . atorvastatin (LIPITOR) 40 MG tablet Take 40 mg by mouth at bedtime.    . B-D ULTRAFINE III SHORT PEN 31G X 8 MM MISC See admin instructions.   . benzonatate (TESSALON) 200 MG capsule TAKE 1 CAPSULE BY MOUTH THREE TIMES A DAY AS NEEDED   . buPROPion (WELLBUTRIN SR) 150 MG 12 hr tablet Take 150 mg by mouth 2 (two) times daily.    . carbidopa-levodopa (SINEMET IR) 25-100 MG tablet Take 1 tablet by mouth 2 (two) times daily.   . cholecalciferol (VITAMIN D) 1000 units tablet Take 1,000 Units by mouth daily.   . clotrimazole-betamethasone (LOTRISONE) cream Apply 1 application topically 2 (two) times daily. 12/04/2017: Patient still has if needed but has not taken recently  . furosemide (LASIX) 40 MG tablet  Take 1 tablet (40 mg total) by mouth daily. Pt is able to take one additional tablet daily for weight increases of 3lbs in a day or 5lbs in a week. Resume on 10/20   . gabapentin (NEURONTIN) 300 MG capsule Take 600 mg by mouth at bedtime.    . Garlic 098 MG TABS Take 300 mg by mouth daily.   Marland Kitchen guaiFENesin (MUCINEX) 600 MG 12 hr tablet Take 1 tablet (600 mg total) by mouth 2 (two) times daily.   Marland Kitchen ipratropium-albuterol (DUONEB) 0.5-2.5 (3) MG/3ML SOLN Inhale 3 mLs into the lungs every 6 (six) hours as needed (for wheezing/shortness of breath).    Marland Kitchen KLOR-CON 10 10 MEQ tablet TAKE 1 TABLET (10 MEQ TOTAL) BY MOUTH DAILY. *FURTHER REFILLS NEED TO BE AUTHORIZED BY PCP* (Patient taking differently: TAKE 1 TABLET (10 MEQ TOTAL) BY MOUTH DAILY.)   . LANTUS SOLOSTAR 100 UNIT/ML Solostar Pen Inject 15 Units into the skin at bedtime.    . lidocaine-prilocaine (EMLA) cream Apply to affected area once (Patient taking differently: Apply 1 application topically daily as needed. )   . linagliptin (TRADJENTA) 5 MG TABS tablet Take 5 mg by mouth daily.   Marland Kitchen loratadine (CLARITIN) 10 MG tablet Take 10 mg by mouth daily.   Marland Kitchen losartan (COZAAR) 50 MG tablet Take 50 mg by mouth daily.   . meclizine (ANTIVERT) 25 MG tablet Take 25 mg by mouth daily.    . metoprolol succinate (TOPROL-XL) 25 MG 24 hr tablet Take 1 tablet (25 mg total) by mouth daily.   . mometasone-formoterol (DULERA) 200-5 MCG/ACT AERO Inhale 2 puffs into the lungs 2 (two) times daily.   . nitroGLYCERIN (NITROSTAT) 0.4 MG SL tablet Place 0.4 mg under the tongue every 5 (five) minutes as needed for chest pain.   . Omega-3 Fatty Acids (FISH OIL PO) Take 1 capsule by mouth  daily.   . omeprazole (PRILOSEC) 20 MG capsule Take 20 mg by mouth 2 (two) times daily.   . ondansetron (ZOFRAN) 8 MG tablet Take 1 tablet (8 mg total) by mouth 2 (two) times daily as needed (Nausea or vomiting).   . OXYGEN Inhale 3 L into the lungs continuous.   . polyethylene glycol powder  (GLYCOLAX/MIRALAX) powder Take 17 g by mouth daily as needed.   . predniSONE (DELTASONE) 10 MG tablet Take '60mg'$  po daily for 2 days then '40mg'$  daily for 2 days then '30mg'$  daily for 2 days then '20mg'$  daily for 2 days then '10mg'$  daily for 2 days then stop   . prochlorperazine (COMPAZINE) 10 MG tablet Take 1 tablet (10 mg total) by mouth every 6 (six) hours as needed (Nausea or vomiting).   . pyridoxine (B-6) 100 MG tablet Take 100 mg by mouth daily.    Marland Kitchen rOPINIRole (REQUIP) 0.5 MG tablet Take 0.5 mg by mouth 2 (two) times daily.    . sodium chloride (OCEAN) 0.65 % SOLN nasal spray Place 1 spray into both nostrils every 3 (three) hours as needed for congestion.    . traMADol (ULTRAM) 50 MG tablet Take 50 mg by mouth every 12 (twelve) hours as needed for moderate pain.  12/04/2017: Patient does not take often as it does not seem to help with pain  . TRELEGY ELLIPTA 100-62.5-25 MCG/INH AEPB    . vitamin B-12 (CYANOCOBALAMIN) 500 MCG tablet Take 500 mcg by mouth daily.   Marland Kitchen zolpidem (AMBIEN) 5 MG tablet Take 5 mg by mouth at bedtime as needed for sleep.   . [DISCONTINUED] benzonatate (TESSALON) 100 MG capsule Take 1 capsule (100 mg total) by mouth every 8 (eight) hours.   . [DISCONTINUED] doxycycline (VIBRAMYCIN) 100 MG capsule Take 1 capsule (100 mg total) by mouth 2 (two) times daily.   . [DISCONTINUED] omeprazole (PRILOSEC) 40 MG capsule Take 40 mg by mouth daily.    No facility-administered encounter medications on file as of 04/29/2018.     ALLERGIES:  Allergies  Allergen Reactions  . Lisinopril Cough     PHYSICAL EXAM:  ECOG Performance status: 1  Vitals:   04/29/18 1110  BP: 122/65  Pulse: 88  Resp: 18  Temp: 98 F (36.7 C)  SpO2: 93%   Filed Weights   04/29/18 1110  Weight: 178 lb 8 oz (81 kg)   Chest is bilaterally clear to auscultation.  Cardiovascular S1-S2 regular rate and rhythm.   LABORATORY DATA:  I have reviewed the labs as listed.  CBC    Component Value Date/Time     WBC 7.7 04/29/2018 1005   RBC 4.00 04/29/2018 1005   HGB 10.7 (L) 04/29/2018 1005   HCT 35.8 (L) 04/29/2018 1005   PLT 216 04/29/2018 1005   MCV 89.5 04/29/2018 1005   MCH 26.8 04/29/2018 1005   MCHC 29.9 (L) 04/29/2018 1005   RDW 14.8 04/29/2018 1005   LYMPHSABS 1.1 04/29/2018 1005   MONOABS 0.6 04/29/2018 1005   EOSABS 0.3 04/29/2018 1005   BASOSABS 0.0 04/29/2018 1005   CMP Latest Ref Rng & Units 04/29/2018 04/08/2018 03/18/2018  Glucose 65 - 99 mg/dL 162(H) 146(H) 146(H)  BUN 6 - 20 mg/dL 17 19 24(H)  Creatinine 0.44 - 1.00 mg/dL 1.62(H) 1.38(H) 1.47(H)  Sodium 135 - 145 mmol/L 135 138 140  Potassium 3.5 - 5.1 mmol/L 4.6 4.3 4.0  Chloride 101 - 111 mmol/L 100(L) 102 103  CO2 22 - 32 mmol/L 29  27 26  Calcium 8.9 - 10.3 mg/dL 8.9 9.2 9.3  Total Protein 6.5 - 8.1 g/dL 6.7 6.7 6.7  Total Bilirubin 0.3 - 1.2 mg/dL 0.6 0.7 0.3  Alkaline Phos 38 - 126 U/L 55 60 51  AST 15 - 41 U/L _0 ALT 14 - 54 U/L 5(L) 5(L) <5(L)       DIAGNOSTIC IMAGING:  I have reviewed her CT scan of the chest, abdomen and pelvis independently and agree with radiology report.  There was no sign of lung nodules or other metastatic disease.     ASSESSMENT & PLAN:   Adenocarcinoma of lung ([place]) 1.Stage IV adenocarcinoma of the lung: She had bilateral lung nodules and mediastinal adenopathy. -Tecentriq was started on 02/25/2018.  She did not experience any immunotherapy related side effects.  CT scan on 03/15/2018 of the chest, abdomen and pelvis did not show any lung nodules or other metastatic disease in the abdomen and pelvis.  She will continue Tecentriq until further progression.  I will see her back in 6 weeks for follow-up.  We plan to repeat scans in 4 months from the last.  She had mildly elevated creatinine today.  She will also receive 500 mL of normal saline.  2.  COPD: Her last admission to the hospital with COPD exacerbation was from 02/05/2018 through 02/08/2018.  She continues to be on 3 L  nasal cannula.  Her breathing status has improved since the discharge.  3.  Diabetes: She will continue Tradjenta and Lantus.  4.  Parkinson's disease: She is continuing Sinemet.  This is well controlled.      Orders placed this encounter:  No orders of the defined types were placed in this encounter.   This note includes documentation from Mike Craze, NP, who was present during this patient's office visit and evaluation.  I have reviewed this note for its completeness and accuracy.  I have edited this note accordingly based on my findings and medical opinion.      Derek Jack, MD [place] [phone]

## 2018-04-29 ENCOUNTER — Inpatient Hospital Stay (HOSPITAL_COMMUNITY): Payer: Medicare Other

## 2018-04-29 ENCOUNTER — Inpatient Hospital Stay (HOSPITAL_BASED_OUTPATIENT_CLINIC_OR_DEPARTMENT_OTHER): Payer: Medicare Other | Admitting: Hematology

## 2018-04-29 ENCOUNTER — Other Ambulatory Visit: Payer: Self-pay

## 2018-04-29 ENCOUNTER — Encounter (HOSPITAL_COMMUNITY): Payer: Self-pay | Admitting: Hematology

## 2018-04-29 VITALS — BP 134/73 | HR 76 | Temp 96.7°F | Resp 18

## 2018-04-29 VITALS — BP 122/65 | HR 88 | Temp 98.0°F | Resp 18 | Wt 178.5 lb

## 2018-04-29 DIAGNOSIS — R5383 Other fatigue: Secondary | ICD-10-CM

## 2018-04-29 DIAGNOSIS — J449 Chronic obstructive pulmonary disease, unspecified: Secondary | ICD-10-CM | POA: Diagnosis not present

## 2018-04-29 DIAGNOSIS — G20 Parkinson's disease: Secondary | ICD-10-CM | POA: Diagnosis not present

## 2018-04-29 DIAGNOSIS — I252 Old myocardial infarction: Secondary | ICD-10-CM

## 2018-04-29 DIAGNOSIS — Z87891 Personal history of nicotine dependence: Secondary | ICD-10-CM | POA: Diagnosis not present

## 2018-04-29 DIAGNOSIS — E785 Hyperlipidemia, unspecified: Secondary | ICD-10-CM

## 2018-04-29 DIAGNOSIS — Z7982 Long term (current) use of aspirin: Secondary | ICD-10-CM

## 2018-04-29 DIAGNOSIS — E119 Type 2 diabetes mellitus without complications: Secondary | ICD-10-CM

## 2018-04-29 DIAGNOSIS — G2581 Restless legs syndrome: Secondary | ICD-10-CM | POA: Diagnosis not present

## 2018-04-29 DIAGNOSIS — I509 Heart failure, unspecified: Secondary | ICD-10-CM | POA: Diagnosis not present

## 2018-04-29 DIAGNOSIS — C3431 Malignant neoplasm of lower lobe, right bronchus or lung: Secondary | ICD-10-CM | POA: Diagnosis not present

## 2018-04-29 DIAGNOSIS — Z79899 Other long term (current) drug therapy: Secondary | ICD-10-CM | POA: Diagnosis not present

## 2018-04-29 DIAGNOSIS — R0602 Shortness of breath: Secondary | ICD-10-CM | POA: Diagnosis not present

## 2018-04-29 DIAGNOSIS — Z7189 Other specified counseling: Secondary | ICD-10-CM

## 2018-04-29 DIAGNOSIS — Z5112 Encounter for antineoplastic immunotherapy: Secondary | ICD-10-CM

## 2018-04-29 DIAGNOSIS — Z794 Long term (current) use of insulin: Secondary | ICD-10-CM | POA: Diagnosis not present

## 2018-04-29 DIAGNOSIS — C3412 Malignant neoplasm of upper lobe, left bronchus or lung: Secondary | ICD-10-CM | POA: Diagnosis not present

## 2018-04-29 DIAGNOSIS — Z9981 Dependence on supplemental oxygen: Secondary | ICD-10-CM | POA: Diagnosis not present

## 2018-04-29 DIAGNOSIS — I110 Hypertensive heart disease with heart failure: Secondary | ICD-10-CM

## 2018-04-29 DIAGNOSIS — C3490 Malignant neoplasm of unspecified part of unspecified bronchus or lung: Secondary | ICD-10-CM

## 2018-04-29 LAB — CBC WITH DIFFERENTIAL/PLATELET
Basophils Absolute: 0 10*3/uL (ref 0.0–0.1)
Basophils Relative: 0 %
Eosinophils Absolute: 0.3 10*3/uL (ref 0.0–0.7)
Eosinophils Relative: 4 %
HCT: 35.8 % — ABNORMAL LOW (ref 36.0–46.0)
Hemoglobin: 10.7 g/dL — ABNORMAL LOW (ref 12.0–15.0)
Lymphocytes Relative: 15 %
Lymphs Abs: 1.1 10*3/uL (ref 0.7–4.0)
MCH: 26.8 pg (ref 26.0–34.0)
MCHC: 29.9 g/dL — ABNORMAL LOW (ref 30.0–36.0)
MCV: 89.5 fL (ref 78.0–100.0)
Monocytes Absolute: 0.6 10*3/uL (ref 0.1–1.0)
Monocytes Relative: 8 %
Neutro Abs: 5.6 10*3/uL (ref 1.7–7.7)
Neutrophils Relative %: 73 %
Platelets: 216 10*3/uL (ref 150–400)
RBC: 4 MIL/uL (ref 3.87–5.11)
RDW: 14.8 % (ref 11.5–15.5)
WBC: 7.7 10*3/uL (ref 4.0–10.5)

## 2018-04-29 LAB — COMPREHENSIVE METABOLIC PANEL
ALT: 5 U/L — ABNORMAL LOW (ref 14–54)
AST: 17 U/L (ref 15–41)
Albumin: 3.6 g/dL (ref 3.5–5.0)
Alkaline Phosphatase: 55 U/L (ref 38–126)
Anion gap: 6 (ref 5–15)
BUN: 17 mg/dL (ref 6–20)
CO2: 29 mmol/L (ref 22–32)
Calcium: 8.9 mg/dL (ref 8.9–10.3)
Chloride: 100 mmol/L — ABNORMAL LOW (ref 101–111)
Creatinine, Ser: 1.62 mg/dL — ABNORMAL HIGH (ref 0.44–1.00)
GFR calc Af Amer: 33 mL/min — ABNORMAL LOW (ref >60)
GFR calc non Af Amer: 28 mL/min — ABNORMAL LOW (ref >60)
Glucose, Bld: 162 mg/dL — ABNORMAL HIGH (ref 65–99)
Potassium: 4.6 mmol/L (ref 3.5–5.1)
Sodium: 135 mmol/L (ref 135–145)
Total Bilirubin: 0.6 mg/dL (ref 0.3–1.2)
Total Protein: 6.7 g/dL (ref 6.5–8.1)

## 2018-04-29 MED ORDER — HEPARIN SOD (PORK) LOCK FLUSH 100 UNIT/ML IV SOLN
500.0000 [IU] | Freq: Once | INTRAVENOUS | Status: AC | PRN
  Administered 2018-04-29: 500 [IU]

## 2018-04-29 MED ORDER — SODIUM CHLORIDE 0.9 % IV SOLN
Freq: Once | INTRAVENOUS | Status: AC
  Administered 2018-04-29: 12:00:00 via INTRAVENOUS

## 2018-04-29 MED ORDER — SODIUM CHLORIDE 0.9% FLUSH
10.0000 mL | INTRAVENOUS | Status: DC | PRN
  Administered 2018-04-29: 10 mL
  Filled 2018-04-29: qty 10

## 2018-04-29 MED ORDER — ATEZOLIZUMAB CHEMO INJECTION 1200 MG/20ML
1200.0000 mg | Freq: Once | INTRAVENOUS | Status: AC
  Administered 2018-04-29: 1200 mg via INTRAVENOUS
  Filled 2018-04-29: qty 20

## 2018-04-29 MED ORDER — SODIUM CHLORIDE 0.9 % IV SOLN
INTRAVENOUS | Status: DC
  Administered 2018-04-29: 12:00:00 via INTRAVENOUS

## 2018-04-29 NOTE — Assessment & Plan Note (Signed)
1.Stage IV adenocarcinoma of the lung: She had bilateral lung nodules and mediastinal adenopathy. -Tecentriq was started on 02/25/2018.  She did not experience any immunotherapy related side effects.  CT scan on 03/15/2018 of the chest, abdomen and pelvis did not show any lung nodules or other metastatic disease in the abdomen and pelvis.  She will continue Tecentriq until further progression.  I will see her back in 6 weeks for follow-up.  We plan to repeat scans in 4 months from the last.  She had mildly elevated creatinine today.  She will also receive 500 mL of normal saline.  2.  COPD: Her last admission to the hospital with COPD exacerbation was from 02/05/2018 through 02/08/2018.  She continues to be on 3 L nasal cannula.  Her breathing status has improved since the discharge.  3.  Diabetes: She will continue Tradjenta and Lantus.  4.  Parkinson's disease: She is continuing Sinemet.  This is well controlled.

## 2018-04-29 NOTE — Patient Instructions (Signed)
[facility] Discharge Instructions for Patients Receiving Chemotherapy  Today you received the following chemotherapy agents tecentriq today.    If you develop nausea and vomiting that is not controlled by your nausea medication, call the clinic.   BELOW ARE SYMPTOMS THAT SHOULD BE REPORTED IMMEDIATELY:  *FEVER GREATER THAN 100.5 F  *CHILLS WITH OR WITHOUT FEVER  NAUSEA AND VOMITING THAT IS NOT CONTROLLED WITH YOUR NAUSEA MEDICATION  *UNUSUAL SHORTNESS OF BREATH  *UNUSUAL BRUISING OR BLEEDING  TENDERNESS IN MOUTH AND THROAT WITH OR WITHOUT PRESENCE OF ULCERS  *URINARY PROBLEMS  *BOWEL PROBLEMS  UNUSUAL RASH Items with * indicate a potential emergency and should be followed up as soon as possible.  Feel free to call the clinic should you have any questions or concerns. The clinic phone number is [phone].  Please show the [place] at check-in to the Emergency Department and triage nurse.

## 2018-04-29 NOTE — Progress Notes (Signed)
Patient tolerated therapy with no complaints voiced.  Port site clean and dry with no bruising or swelling noted at site.  No complaints of pain with flush and flushed easily.  Band aid applied.  VSS with discharge and left via wheelchair with family.  No s/s of distress noted.

## 2018-05-18 ENCOUNTER — Other Ambulatory Visit (HOSPITAL_COMMUNITY): Payer: Self-pay

## 2018-05-18 DIAGNOSIS — C3490 Malignant neoplasm of unspecified part of unspecified bronchus or lung: Secondary | ICD-10-CM

## 2018-05-20 ENCOUNTER — Inpatient Hospital Stay (HOSPITAL_COMMUNITY): Payer: Medicare Other | Attending: Hematology

## 2018-05-20 ENCOUNTER — Inpatient Hospital Stay (HOSPITAL_COMMUNITY): Payer: Medicare Other

## 2018-05-20 ENCOUNTER — Encounter (HOSPITAL_COMMUNITY): Payer: Self-pay

## 2018-05-20 VITALS — BP 121/49 | HR 74 | Temp 97.9°F | Resp 18 | Wt 154.6 lb

## 2018-05-20 DIAGNOSIS — Z5112 Encounter for antineoplastic immunotherapy: Secondary | ICD-10-CM | POA: Diagnosis not present

## 2018-05-20 DIAGNOSIS — C3412 Malignant neoplasm of upper lobe, left bronchus or lung: Secondary | ICD-10-CM | POA: Diagnosis not present

## 2018-05-20 DIAGNOSIS — Z7189 Other specified counseling: Secondary | ICD-10-CM

## 2018-05-20 DIAGNOSIS — C3490 Malignant neoplasm of unspecified part of unspecified bronchus or lung: Secondary | ICD-10-CM

## 2018-05-20 LAB — COMPREHENSIVE METABOLIC PANEL
ALT: 3 U/L — ABNORMAL LOW (ref 14–54)
AST: 13 U/L — ABNORMAL LOW (ref 15–41)
Albumin: 3.5 g/dL (ref 3.5–5.0)
Alkaline Phosphatase: 53 U/L (ref 38–126)
Anion gap: 8 (ref 5–15)
BUN: 24 mg/dL — ABNORMAL HIGH (ref 6–20)
CO2: 28 mmol/L (ref 22–32)
Calcium: 9.2 mg/dL (ref 8.9–10.3)
Chloride: 101 mmol/L (ref 101–111)
Creatinine, Ser: 1.35 mg/dL — ABNORMAL HIGH (ref 0.44–1.00)
GFR calc Af Amer: 41 mL/min — ABNORMAL LOW (ref >60)
GFR calc non Af Amer: 35 mL/min — ABNORMAL LOW (ref >60)
Glucose, Bld: 172 mg/dL — ABNORMAL HIGH (ref 65–99)
Potassium: 4.3 mmol/L (ref 3.5–5.1)
Sodium: 137 mmol/L (ref 135–145)
Total Bilirubin: 0.6 mg/dL (ref 0.3–1.2)
Total Protein: 6.7 g/dL (ref 6.5–8.1)

## 2018-05-20 LAB — CBC WITH DIFFERENTIAL/PLATELET
Basophils Absolute: 0 10*3/uL (ref 0.0–0.1)
Basophils Relative: 0 %
Eosinophils Absolute: 0.3 10*3/uL (ref 0.0–0.7)
Eosinophils Relative: 3 %
HCT: 37.7 % (ref 36.0–46.0)
Hemoglobin: 11.4 g/dL — ABNORMAL LOW (ref 12.0–15.0)
Lymphocytes Relative: 14 %
Lymphs Abs: 1.5 10*3/uL (ref 0.7–4.0)
MCH: 26.5 pg (ref 26.0–34.0)
MCHC: 30.2 g/dL (ref 30.0–36.0)
MCV: 87.5 fL (ref 78.0–100.0)
Monocytes Absolute: 0.9 10*3/uL (ref 0.1–1.0)
Monocytes Relative: 9 %
Neutro Abs: 7.5 10*3/uL (ref 1.7–7.7)
Neutrophils Relative %: 74 %
Platelets: 212 10*3/uL (ref 150–400)
RBC: 4.31 MIL/uL (ref 3.87–5.11)
RDW: 14.9 % (ref 11.5–15.5)
WBC: 10.2 10*3/uL (ref 4.0–10.5)

## 2018-05-20 LAB — TSH: TSH: 1.052 u[IU]/mL (ref 0.350–4.500)

## 2018-05-20 MED ORDER — HEPARIN SOD (PORK) LOCK FLUSH 100 UNIT/ML IV SOLN
500.0000 [IU] | Freq: Once | INTRAVENOUS | Status: AC | PRN
  Administered 2018-05-20: 500 [IU]

## 2018-05-20 MED ORDER — SODIUM CHLORIDE 0.9 % IV SOLN
1200.0000 mg | Freq: Once | INTRAVENOUS | Status: AC
  Administered 2018-05-20: 1200 mg via INTRAVENOUS
  Filled 2018-05-20: qty 20

## 2018-05-20 MED ORDER — SODIUM CHLORIDE 0.9 % IV SOLN
Freq: Once | INTRAVENOUS | Status: AC
  Administered 2018-05-20: 12:00:00 via INTRAVENOUS

## 2018-05-20 MED ORDER — SODIUM CHLORIDE 0.9% FLUSH
10.0000 mL | INTRAVENOUS | Status: DC | PRN
  Administered 2018-05-20: 10 mL
  Filled 2018-05-20: qty 10

## 2018-05-20 NOTE — Patient Instructions (Signed)
[facility] Discharge Instructions for Patients Receiving Chemotherapy  Today you received the following chemotherapy agents tecentriq   If you develop nausea and vomiting that is not controlled by your nausea medication, call the clinic.   BELOW ARE SYMPTOMS THAT SHOULD BE REPORTED IMMEDIATELY:  *FEVER GREATER THAN 100.5 F  *CHILLS WITH OR WITHOUT FEVER  NAUSEA AND VOMITING THAT IS NOT CONTROLLED WITH YOUR NAUSEA MEDICATION  *UNUSUAL SHORTNESS OF BREATH  *UNUSUAL BRUISING OR BLEEDING  TENDERNESS IN MOUTH AND THROAT WITH OR WITHOUT PRESENCE OF ULCERS  *URINARY PROBLEMS  *BOWEL PROBLEMS  UNUSUAL RASH Items with * indicate a potential emergency and should be followed up as soon as possible.  Feel free to call the clinic should you have any questions or concerns. The clinic phone number is [phone].  Please show the [place] at check-in to the Emergency Department and triage nurse.

## 2018-05-20 NOTE — Progress Notes (Signed)
Reviewed patients weight loss for todays treatment with Dr. Delton Coombes and ok to treat with tecentriq.  Patient stated she's not as hungry and fills up quicker.  No other complaints voiced.   Patient tolerated treatment with no complaints voiced.  Port site clean and dry with no complaints of pain with flush.  No bruising or swelling noted at site.  Band aid applied.  VSS with discharge and left by wheelchair with no s/s of distress noted.  Dietary appointment made per Dr. Delton Coombes.

## 2018-05-22 ENCOUNTER — Emergency Department (HOSPITAL_COMMUNITY)
Admission: EM | Admit: 2018-05-22 | Discharge: 2018-05-22 | Disposition: A | Discharge status: Home / Self Care | Payer: Medicare Other | Source: Home / Self Care | Attending: Emergency Medicine | Admitting: Emergency Medicine

## 2018-05-22 ENCOUNTER — Other Ambulatory Visit: Payer: Self-pay

## 2018-05-22 ENCOUNTER — Emergency Department (HOSPITAL_COMMUNITY): Payer: Medicare Other

## 2018-05-22 ENCOUNTER — Encounter (HOSPITAL_COMMUNITY): Payer: Self-pay

## 2018-05-22 DIAGNOSIS — E871 Hypo-osmolality and hyponatremia: Secondary | ICD-10-CM | POA: Diagnosis not present

## 2018-05-22 DIAGNOSIS — M1990 Unspecified osteoarthritis, unspecified site: Secondary | ICD-10-CM | POA: Diagnosis not present

## 2018-05-22 DIAGNOSIS — C3490 Malignant neoplasm of unspecified part of unspecified bronchus or lung: Secondary | ICD-10-CM | POA: Diagnosis not present

## 2018-05-22 DIAGNOSIS — E1122 Type 2 diabetes mellitus with diabetic chronic kidney disease: Secondary | ICD-10-CM | POA: Diagnosis not present

## 2018-05-22 DIAGNOSIS — Z7982 Long term (current) use of aspirin: Secondary | ICD-10-CM

## 2018-05-22 DIAGNOSIS — J189 Pneumonia, unspecified organism: Secondary | ICD-10-CM | POA: Diagnosis not present

## 2018-05-22 DIAGNOSIS — I2510 Atherosclerotic heart disease of native coronary artery without angina pectoris: Secondary | ICD-10-CM | POA: Diagnosis not present

## 2018-05-22 DIAGNOSIS — R1084 Generalized abdominal pain: Secondary | ICD-10-CM

## 2018-05-22 DIAGNOSIS — I5032 Chronic diastolic (congestive) heart failure: Secondary | ICD-10-CM | POA: Insufficient documentation

## 2018-05-22 DIAGNOSIS — R112 Nausea with vomiting, unspecified: Secondary | ICD-10-CM

## 2018-05-22 DIAGNOSIS — I248 Other forms of acute ischemic heart disease: Secondary | ICD-10-CM | POA: Diagnosis not present

## 2018-05-22 DIAGNOSIS — Z9049 Acquired absence of other specified parts of digestive tract: Secondary | ICD-10-CM | POA: Diagnosis not present

## 2018-05-22 DIAGNOSIS — N183 Chronic kidney disease, stage 3 (moderate): Secondary | ICD-10-CM | POA: Diagnosis not present

## 2018-05-22 DIAGNOSIS — J449 Chronic obstructive pulmonary disease, unspecified: Secondary | ICD-10-CM

## 2018-05-22 DIAGNOSIS — R197 Diarrhea, unspecified: Secondary | ICD-10-CM | POA: Insufficient documentation

## 2018-05-22 DIAGNOSIS — Z8249 Family history of ischemic heart disease and other diseases of the circulatory system: Secondary | ICD-10-CM | POA: Diagnosis not present

## 2018-05-22 DIAGNOSIS — I8500 Esophageal varices without bleeding: Secondary | ICD-10-CM | POA: Diagnosis not present

## 2018-05-22 DIAGNOSIS — I252 Old myocardial infarction: Secondary | ICD-10-CM

## 2018-05-22 DIAGNOSIS — K5730 Diverticulosis of large intestine without perforation or abscess without bleeding: Secondary | ICD-10-CM | POA: Diagnosis not present

## 2018-05-22 DIAGNOSIS — K56600 Partial intestinal obstruction, unspecified as to cause: Secondary | ICD-10-CM | POA: Diagnosis not present

## 2018-05-22 DIAGNOSIS — Z87891 Personal history of nicotine dependence: Secondary | ICD-10-CM

## 2018-05-22 DIAGNOSIS — Z79899 Other long term (current) drug therapy: Secondary | ICD-10-CM | POA: Insufficient documentation

## 2018-05-22 DIAGNOSIS — Z85118 Personal history of other malignant neoplasm of bronchus and lung: Secondary | ICD-10-CM | POA: Diagnosis not present

## 2018-05-22 DIAGNOSIS — R0602 Shortness of breath: Secondary | ICD-10-CM | POA: Diagnosis not present

## 2018-05-22 DIAGNOSIS — Z794 Long term (current) use of insulin: Secondary | ICD-10-CM

## 2018-05-22 DIAGNOSIS — Z9981 Dependence on supplemental oxygen: Secondary | ICD-10-CM | POA: Diagnosis not present

## 2018-05-22 DIAGNOSIS — I130 Hypertensive heart and chronic kidney disease with heart failure and stage 1 through stage 4 chronic kidney disease, or unspecified chronic kidney disease: Secondary | ICD-10-CM

## 2018-05-22 DIAGNOSIS — J690 Pneumonitis due to inhalation of food and vomit: Secondary | ICD-10-CM | POA: Diagnosis not present

## 2018-05-22 DIAGNOSIS — Z833 Family history of diabetes mellitus: Secondary | ICD-10-CM | POA: Diagnosis not present

## 2018-05-22 DIAGNOSIS — G20 Parkinson's disease: Secondary | ICD-10-CM | POA: Diagnosis not present

## 2018-05-22 DIAGNOSIS — R000 Tachycardia, unspecified: Secondary | ICD-10-CM | POA: Diagnosis not present

## 2018-05-22 DIAGNOSIS — E870 Hyperosmolality and hypernatremia: Secondary | ICD-10-CM | POA: Diagnosis not present

## 2018-05-22 DIAGNOSIS — M810 Age-related osteoporosis without current pathological fracture: Secondary | ICD-10-CM | POA: Diagnosis not present

## 2018-05-22 DIAGNOSIS — E785 Hyperlipidemia, unspecified: Secondary | ICD-10-CM

## 2018-05-22 LAB — CBC WITH DIFFERENTIAL/PLATELET
Basophils Absolute: 0 10*3/uL (ref 0.0–0.1)
Basophils Relative: 0 %
Eosinophils Absolute: 0.3 10*3/uL (ref 0.0–0.7)
Eosinophils Relative: 2 %
HCT: 40.3 % (ref 36.0–46.0)
Hemoglobin: 12 g/dL (ref 12.0–15.0)
Lymphocytes Relative: 15 %
Lymphs Abs: 1.7 10*3/uL (ref 0.7–4.0)
MCH: 26.2 pg (ref 26.0–34.0)
MCHC: 29.8 g/dL — ABNORMAL LOW (ref 30.0–36.0)
MCV: 88 fL (ref 78.0–100.0)
Monocytes Absolute: 1.3 10*3/uL — ABNORMAL HIGH (ref 0.1–1.0)
Monocytes Relative: 12 %
Neutro Abs: 8 10*3/uL — ABNORMAL HIGH (ref 1.7–7.7)
Neutrophils Relative %: 71 %
Platelets: 203 10*3/uL (ref 150–400)
RBC: 4.58 MIL/uL (ref 3.87–5.11)
RDW: 15.2 % (ref 11.5–15.5)
WBC: 11.2 10*3/uL — ABNORMAL HIGH (ref 4.0–10.5)

## 2018-05-22 LAB — URINALYSIS, ROUTINE W REFLEX MICROSCOPIC
Bacteria, UA: NONE SEEN
Bilirubin Urine: NEGATIVE
Glucose, UA: NEGATIVE mg/dL
Hgb urine dipstick: NEGATIVE
Ketones, ur: NEGATIVE mg/dL
Nitrite: NEGATIVE
Protein, ur: NEGATIVE mg/dL
Specific Gravity, Urine: 1.006 (ref 1.005–1.030)
pH: 5 (ref 5.0–8.0)

## 2018-05-22 LAB — COMPREHENSIVE METABOLIC PANEL
ALT: <5 U/L — ABNORMAL LOW (ref 14–54)
AST: 16 U/L (ref 15–41)
Albumin: 3.9 g/dL (ref 3.5–5.0)
Alkaline Phosphatase: 59 U/L (ref 38–126)
Anion gap: 8 (ref 5–15)
BUN: 18 mg/dL (ref 6–20)
CO2: 28 mmol/L (ref 22–32)
Calcium: 9.1 mg/dL (ref 8.9–10.3)
Chloride: 95 mmol/L — ABNORMAL LOW (ref 101–111)
Creatinine, Ser: 1.44 mg/dL — ABNORMAL HIGH (ref 0.44–1.00)
GFR calc Af Amer: 38 mL/min — ABNORMAL LOW (ref >60)
GFR calc non Af Amer: 33 mL/min — ABNORMAL LOW (ref >60)
Glucose, Bld: 109 mg/dL — ABNORMAL HIGH (ref 65–99)
Potassium: 4 mmol/L (ref 3.5–5.1)
Sodium: 131 mmol/L — ABNORMAL LOW (ref 135–145)
Total Bilirubin: 0.7 mg/dL (ref 0.3–1.2)
Total Protein: 7.4 g/dL (ref 6.5–8.1)

## 2018-05-22 LAB — LIPASE, BLOOD: Lipase: 50 U/L (ref 11–51)

## 2018-05-22 MED ORDER — ONDANSETRON 4 MG PO TBDP: 4.0000 mg | ORAL_TABLET | Freq: Three times a day (TID) | ORAL | 0 refills | Status: AC | PRN

## 2018-05-22 MED ORDER — SODIUM CHLORIDE 0.9 % IV BOLUS
500.0000 mL | Freq: Once | INTRAVENOUS | Status: AC
  Administered 2018-05-22: 500 mL via INTRAVENOUS

## 2018-05-22 MED ORDER — ONDANSETRON 4 MG PO TBDP
ORAL_TABLET | ORAL | Status: AC
  Filled 2018-05-22: qty 1

## 2018-05-22 MED ORDER — ONDANSETRON 4 MG PO TBDP
4.0000 mg | ORAL_TABLET | Freq: Once | ORAL | Status: AC
  Administered 2018-05-22: 4 mg via ORAL

## 2018-05-22 MED ORDER — MORPHINE SULFATE (PF) 2 MG/ML IV SOLN
2.0000 mg | INTRAVENOUS | Status: AC | PRN
  Administered 2018-05-22 (×2): 2 mg via INTRAVENOUS
  Filled 2018-05-22 (×2): qty 1

## 2018-05-22 MED ORDER — SODIUM CHLORIDE 0.9 % IV SOLN
INTRAVENOUS | Status: DC
  Administered 2018-05-22: 17:00:00 via INTRAVENOUS

## 2018-05-22 NOTE — ED Notes (Signed)
Date and time results received: 05/22/18 4:10 PM  (use smartphrase ".now" to insert current time)  Test: Troponin Critical Value: 0.03  Name of Provider Notified: Thurnell Garbe  Orders Received? Or Actions Taken?: Orders Received - See Orders for details

## 2018-05-22 NOTE — ED Notes (Signed)
Pt tolerating fluids without difficulty.

## 2018-05-22 NOTE — Discharge Instructions (Addendum)
Take the prescriptions as directed.  Increase your fluid intake (ie:  Gatoraide) for the next few days, as discussed.  Eat a bland diet and advance to your regular diet slowly as you can tolerate it.   Avoid full strength juices, as well as milk and milk products until your diarrhea has resolved.   Call your regular medical doctor Monday to schedule a follow up appointment in the next 3 days. Return to the Emergency Department immediately sooner if worsening.

## 2018-05-22 NOTE — ED Triage Notes (Signed)
Pt is having stomach pain, nausea  and diarrhea. States this has been going on for 2 days. Diarrhea is watery and her last episode was last night. Pt on 3L O2 at all time.

## 2018-05-22 NOTE — ED Provider Notes (Signed)
[facility] EMERGENCY DEPARTMENT Provider Note   CSN: [id number] Arrival date & time: 05/22/18  1458     History   Chief Complaint Chief Complaint  Patient presents with  . Abdominal Pain    HPI Angela Lawson is a 85 y.o. female.  HPI Pt was seen at 1600.  Per pt, c/o gradual onset and persistence of constant generalized abd "pain" for the past 2 days.  Has been associated with vomiting today, nausea and diarrhea since yesterday. Describes the abd pain as "cramping." Describes the diarrhea as "watery."  Denies fevers, no back pain, no rash, no CP/palpitations, no cough/SOB, no black or blood in stools or emesis.      Past Medical History:  Diagnosis Date  . Adenocarcinoma of lung ([place])    Left lung 2009, resected  . Anginal pain ([place])   . Arthritis   . Back pain   . CHF (congestive heart failure) ([place])   . COPD (chronic obstructive pulmonary disease) ([place])   . Diverticulitis   . Dyslipidemia   . Essential hypertension   . H/O ventral hernia   . Non-obstructive CAD    a. 04/2015 NSTEMI/Cath: LAD 10p, LCX 36m, RCA 20m, 20d, EF 35-40 w/ apical ballooning.  . On home O2    3L N/C  . Osteoporosis   . Osteoporosis 11/04/2015   Managed by Dr. Legrand Rams   . Parkinson's disease ([place])   . Shortness of breath   . Takotsubo cardiomyopathy    a. 04/2015 Echo: EF 45-50%, mid-dist anterior/apical/inferoapical HK w/ hyperdynamic base. Gr 1 DD, mild AI, mild-mod MR, triv TR, PASP 48mmHg;  b. 04/2015 LV gram: Ef 35-40% w/ apical ballooning.  . Type II diabetes mellitus ([place])   . Ventricular bigeminy    a. 04/2015 in setting of NSTEMI/Takotsubo.    Patient Active Problem List   Diagnosis Date Noted  . Respiratory failure ([place]) 02/06/2018  . Acute respiratory failure ([place]) 12/04/2017  . Community acquired pneumonia of left lung 07/19/2017  . Critical lower limb ischemia 07/07/2017  . Counseling regarding advanced care planning and goals of care 05/15/2017  . Acute renal injury ([place])  04/04/2017  . Chest pain 04/04/2017  . Nausea and vomiting 04/04/2017  . COPD with acute exacerbation ([place]) 04/25/2016  . Acute on chronic respiratory failure with hypoxia ([place]) 04/25/2016  . SOB (shortness of breath)   . Osteoporosis 11/04/2015  . Chronic diastolic CHF (congestive heart failure) ([place]) 08/01/2015  . CHF exacerbation ([place]) 07/09/2015  . Essential hypertension 04/19/2015  . Type II diabetes mellitus ([place]) 04/19/2015  . Dyslipidemia 04/19/2015  . History of lung cancer 04/19/2015  . Takotsubo cardiomyopathy 04/19/2015  . Ventricular bigeminy 04/19/2015  . NSTEMI (non-ST elevated myocardial infarction) ([place]) 04/19/2015  . Stress-induced cardiomyopathy 04/18/2015  . Elevated troponin 04/16/2015  . COPD exacerbation ([place]) 04/16/2015  . CKD (chronic kidney disease) stage 3, GFR 30-59 ml/min (H[facility]) 04/16/2015  . Herpes genitalis in women 11/24/2014  . Toe fracture 09/28/2014  . Non-traumatic tear of right rotator cuff 11/30/2013  . Pain in joint, shoulder region 11/30/2013  . Muscle weakness (generalized) 11/30/2013  . Bursitis of left shoulder 02/09/2013  . Adenocarcinoma of lung ([place]) 08/20/2012    Past Surgical History:  Procedure Laterality Date  . ABDOMINAL HYSTERECTOMY    . CARDIAC CATHETERIZATION N/A 04/17/2015   Procedure: Left Heart Cath and Coronary Angiography;  Surgeon: Troy Sine, MD;  Location: [facility];  Service: Cardiovascular;  Laterality: N/A;  .  CHOLECYSTECTOMY    . COLONOSCOPY N/A 09/18/2014   Procedure: COLONOSCOPY;  Surgeon: Danie Binder, MD;  Location: [facility];  Service: Endoscopy;  Laterality: N/A;  8:30 AM - moved to 10:30 Ros[place] Gros to notify pt  . ECTOPIC PREGNANCY SURGERY    . INCISIONAL HERNIA REPAIR N/A 08/26/2013   Procedure: HERNIA REPAIR INCISIONAL WITH MESH;  Surgeon: Jamesetta So, MD;  Location: [facility] OR[facility];  Service: General;  Laterality: N/A;  . IR FLUORO GUIDE PORT INSERTION RIGHT  04/30/2017  . IR US GUIDE VASC  ACCESS RIGHT  04/30/2017  . LUNG CANCER SURGERY    . VIDEO BRONCHOSCOPY WITH ENDOBRONCHIAL NAVIGATION N/A 04/23/2017   Procedure: VIDEO BRONCHOSCOPY WITH ENDOBRONCHIAL NAVIGATION;  Surgeon: Melrose Nakayama, MD;  Location: [facility];  Service: Thoracic;  Laterality: N/A;  . VIDEO BRONCHOSCOPY WITH ENDOBRONCHIAL ULTRASOUND N/A 04/23/2017   Procedure: VIDEO BRONCHOSCOPY WITH ENDOBRONCHIAL ULTRASOUND;  Surgeon: Melrose Nakayama, MD;  Location: [facility];  Service: Thoracic;  Laterality: N/A;     OB History   None      Home Medications    Prior to Admission medications   Medication Sig Start Date End Date Taking? Authorizing Provider  albuterol (PROAIR HFA) 108 (90 BASE) MCG/ACT inhaler Inhale 2 puffs into the lungs every 4 (four) hours as needed for wheezing or shortness of breath.    [provider]  alendronate (FOSAMAX) 70 MG tablet Take 70 mg by mouth every Friday.     [provider]  aspirin EC 81 MG tablet Take 81 mg by mouth daily.    [provider]  Atezolizumab (TECENTRIQ IV) Inject into the vein. Every 3 weeks    [provider]  atorvastatin (LIPITOR) 40 MG tablet Take 40 mg by mouth at bedtime.     [provider]  B-D ULTRAFINE III SHORT PEN 31G X 8 MM MISC See admin instructions. 12/27/17   [provider]  benzonatate (TESSALON) 200 MG capsule TAKE 1 CAPSULE BY MOUTH THREE TIMES A DAY AS NEEDED 04/23/18   [provider]  buPROPion (WELLBUTRIN SR) 150 MG 12 hr tablet Take 150 mg by mouth 2 (two) times daily.     [provider]  carbidopa-levodopa (SINEMET IR) 25-100 MG tablet Take 1 tablet by mouth 2 (two) times daily.    [provider]  cholecalciferol (VITAMIN D) 1000 units tablet Take 1,000 Units by mouth daily.    [provider]  clotrimazole-betamethasone (LOTRISONE) cream Apply 1 application topically 2 (two) times daily. 09/10/17   Holley Bouche, NP  furosemide (LASIX) 40 MG  tablet Take 1 tablet (40 mg total) by mouth daily. Pt is able to take one additional tablet daily for weight increases of 3lbs in a day or 5lbs in a week. Resume on 10/20 09/23/17   Kathie Dike, MD  gabapentin (NEURONTIN) 300 MG capsule Take 600 mg by mouth at bedtime.     [provider]  Garlic 366 MG TABS Take 300 mg by mouth daily.    [provider]  guaiFENesin (MUCINEX) 600 MG 12 hr tablet Take 1 tablet (600 mg total) by mouth 2 (two) times daily. 09/23/17 09/23/18  Kathie Dike, MD  ipratropium-albuterol (DUONEB) 0.5-2.5 (3) MG/3ML SOLN Inhale 3 mLs into the lungs every 6 (six) hours as needed (for wheezing/shortness of breath).     [provider]  KLOR-CON 10 10 MEQ tablet TAKE 1 TABLET (10 MEQ TOTAL) BY MOUTH DAILY. *FURTHER REFILLS  NEED TO BE AUTHORIZED BY PCP* Patient taking differently: TAKE 1 TABLET (10 MEQ TOTAL) BY MOUTH DAILY. 08/11/17   Lorretta Harp, MD  LANTUS SOLOSTAR 100 UNIT/ML Solostar Pen Inject 15 Units into the skin at bedtime.     [provider]  lidocaine-prilocaine (EMLA) cream Apply to affected area once Patient taking differently: Apply 1 application topically daily as needed.  05/19/17   Brunetta Genera, MD  linagliptin (TRADJENTA) 5 MG TABS tablet Take 5 mg by mouth daily.    [provider]  loratadine (CLARITIN) 10 MG tablet Take 10 mg by mouth daily.    [provider]  losartan (COZAAR) 50 MG tablet Take 50 mg by mouth daily. 02/22/18   [provider]  meclizine (ANTIVERT) 25 MG tablet Take 25 mg by mouth daily.     [provider]  metoprolol succinate (TOPROL-XL) 25 MG 24 hr tablet Take 1 tablet (25 mg total) by mouth daily. 06/26/15   Jettie Booze, MD  mometasone-formoterol [facility]) 200-5 MCG/ACT AERO Inhale 2 puffs into the lungs 2 (two) times daily. 02/08/18   Kathie Dike, MD  nitroGLYCERIN (NITROSTAT) 0.4 MG SL tablet Place 0.4 mg under the tongue every 5 (five)  minutes as needed for chest pain.    [provider]  Omega-3 Fatty Acids (FISH OIL PO) Take 1 capsule by mouth daily.    [provider]  omeprazole (PRILOSEC) 20 MG capsule Take 20 mg by mouth 2 (two) times daily. 04/23/18   [provider]  ondansetron (ZOFRAN) 8 MG tablet Take 1 tablet (8 mg total) by mouth 2 (two) times daily as needed (Nausea or vomiting). 05/19/17   Brunetta Genera, MD  OXYGEN Inhale 3 L into the lungs continuous.    [provider]  polyethylene glycol powder (GLYCOLAX/MIRALAX) powder Take 17 g by mouth daily as needed.    [provider]  predniSONE (DELTASONE) 10 MG tablet Take 60mg  po daily for 2 days then 40mg  daily for 2 days then 30mg  daily for 2 days then 20mg  daily for 2 days then 10mg  daily for 2 days then stop 02/08/18   Kathie Dike, MD  prochlorperazine (COMPAZINE) 10 MG tablet Take 1 tablet (10 mg total) by mouth every 6 (six) hours as needed (Nausea or vomiting). 05/19/17   Brunetta Genera, MD  pyridoxine (B-6) 100 MG tablet Take 100 mg by mouth daily.     [provider]  rOPINIRole (REQUIP) 0.5 MG tablet Take 0.5 mg by mouth 2 (two) times daily.     [provider]  sodium chloride (OCEAN) 0.65 % SOLN nasal spray Place 1 spray into both nostrils every 3 (three) hours as needed for congestion.     [provider]  traMADol (ULTRAM) 50 MG tablet Take 50 mg by mouth every 12 (twelve) hours as needed for moderate pain.     [provider]  Donnal Debar 100-62.5-25 MCG/INH AEPB  04/07/18   [provider]  vitamin B-12 (CYANOCOBALAMIN) 500 MCG tablet Take 500 mcg by mouth daily. 02/15/18   [provider]  zolpidem (AMBIEN) 5 MG tablet Take 5 mg by mouth at bedtime as needed for sleep.    [provider]    Family History Family History  Problem Relation Age of Onset  . Diabetes Mother   . Hypertension Mother   . Diabetes Father   . Asthma  Unknown   . Cancer Unknown   . Heart attack  Sister        X24  . Colon cancer Neg Hx     Social History Social History   Tobacco Use  . Smoking status: Former Smoker    Years: 20.00    Types: Cigarettes    Last attempt to quit: 08/13/2006    Years since quitting: 11.7  . Smokeless tobacco: Never Used  Substance Use Topics  . Alcohol use: No    Alcohol/week: 0.0 oz  . Drug use: No     Allergies   Lisinopril   Review of Systems Review of Systems ROS: Statement: All systems negative except as marked or noted in the HPI; Constitutional: Negative for fever and chills. ; ; Eyes: Negative for eye pain, redness and discharge. ; ; ENMT: Negative for ear pain, hoarseness, nasal congestion, sinus pressure and sore throat. ; ; Cardiovascular: Negative for chest pain, palpitations, diaphoresis, dyspnea and peripheral edema. ; ; Respiratory: Negative for cough, wheezing and stridor. ; ; Gastrointestinal: +N/V/D, abd pain. Negative for blood in stool, hematemesis, jaundice and rectal bleeding.; ; Genitourinary: Negative for dysuria, flank pain and hematuria. ; ; Musculoskeletal: Negative for back pain and neck pain. Negative for swelling and trauma.; ; Skin: Negative for pruritus, rash, abrasions, blisters, bruising and skin lesion.; ; Neuro: Negative for headache, lightheadedness and neck stiffness. Negative for weakness, altered level of consciousness, altered mental status, extremity weakness, paresthesias, involuntary movement, seizure and syncope.       Physical Exam Updated Vital Signs BP 98/66   Pulse 81   Temp 98.3 F (36.8 C) (Oral)   Resp 20   SpO2 91%    Patient Vitals for the past 24 hrs:  BP Temp Temp src Pulse Resp SpO2  05/22/18 2000 (!) 143/54 - - 78 - 98 %  05/22/18 1945 - - - 80 - 96 %  05/22/18 1930 (!) 158/71 - - 80 - 96 %  05/22/18 1915 - - - 79 - 96 %  05/22/18 1900 139/89 - - 79 - 96 %  05/22/18 1852 (!) 131/56 - - 73 20 95 %  05/22/18 1520 98/66 98.3 F (36.8  C) Oral 81 20 91 %     Physical Exam 1605: Physical examination:  Nursing notes reviewed; Vital signs and O2 SAT reviewed;  Constitutional: Well developed, Well nourished, In no acute distress; Head:  Normocephalic, atraumatic; Eyes: EOMI, PERRL, No scleral icterus; ENMT: Mouth and pharynx normal, Mucous membranes dry; Neck: Supple, Full range of motion, No lymphadenopathy; Cardiovascular: Regular rate and rhythm, No gallop; Respiratory: Breath sounds clear & equal bilaterally, No wheezes.  Speaking full sentences with ease, Normal respiratory effort/excursion; Chest: Nontender, Movement normal; Abdomen: Soft, +mild diffuse tenderness to palp. No rebound or guarding. Nondistended, Normal bowel sounds; Genitourinary: No CVA tenderness; Extremities: Peripheral pulses normal, No tenderness, No edema, No calf edema or asymmetry.; Neuro: AA&Ox3, Major CN grossly intact.  Speech clear. No gross focal motor or sensory deficits in extremities.; Skin: Color normal, Warm, Dry.   ED Treatments / Results  Labs (all labs ordered are listed, but only abnormal results are displayed)   EKG None  Radiology   Procedures Procedures (including critical care time)  Medications Ordered in ED Medications  ondansetron (ZOFRAN-ODT) 4 MG disintegrating tablet (has no administration in time range)  0.9 %  sodium chloride infusion (has no administration in time range)  morphine 2 MG/ML injection 2 mg (has no administration in time range)  ondansetron (ZOFRAN-ODT) disintegrating tablet 4 mg (4 mg Oral  Given 05/22/18 1600)     Initial Impression / Assessment and Plan / ED Course  I have reviewed the triage vital signs and the nursing notes.  Pertinent labs & imaging results that were available during my care of the patient were reviewed by me and considered in my medical decision making (see chart for details).  MDM Reviewed: previous chart, nursing note and vitals Reviewed previous: labs Interpretation:  labs, x-ray and CT scan   Results for orders placed or performed during the hospital encounter of 05/22/18  Lipase, blood  Result Value Ref Range   Lipase 50 11 - 51 U/L  Comprehensive metabolic panel  Result Value Ref Range   Sodium 131 (L) 135 - 145 mmol/L   Potassium 4.0 3.5 - 5.1 mmol/L   Chloride 95 (L) 101 - 111 mmol/L   CO2 28 22 - 32 mmol/L   Glucose, Bld 109 (H) 65 - 99 mg/dL   BUN 18 6 - 20 mg/dL   Creatinine, Ser 1.44 (H) 0.44 - 1.00 mg/dL   Calcium 9.1 8.9 - 10.3 mg/dL   Total Protein 7.4 6.5 - 8.1 g/dL   Albumin 3.9 3.5 - 5.0 g/dL   AST 16 15 - 41 U/L   ALT <5 (L) 14 - 54 U/L   Alkaline Phosphatase 59 38 - 126 U/L   Total Bilirubin 0.7 0.3 - 1.2 mg/dL   GFR calc non Af Amer 33 (L) >60 mL/min   GFR calc Af Amer 38 (L) >60 mL/min   Anion gap 8 5 - 15  Urinalysis, Routine w reflex microscopic  Result Value Ref Range   Color, Urine STRAW (A) YELLOW   APPearance CLEAR CLEAR   Specific Gravity, Urine 1.006 1.005 - 1.030   pH 5.0 5.0 - 8.0   Glucose, UA NEGATIVE NEGATIVE mg/dL   Hgb urine dipstick NEGATIVE NEGATIVE   Bilirubin Urine NEGATIVE NEGATIVE   Ketones, ur NEGATIVE NEGATIVE mg/dL   Protein, ur NEGATIVE NEGATIVE mg/dL   Nitrite NEGATIVE NEGATIVE   Leukocytes, UA SMALL (A) NEGATIVE   RBC / HPF 0-5 0 - 5 RBC/hpf   WBC, UA 21-50 0 - 5 WBC/hpf   Bacteria, UA NONE SEEN NONE SEEN   Mucus PRESENT    Hyaline Casts, UA PRESENT   CBC with Differential/Platelet  Result Value Ref Range   WBC 11.2 (H) 4.0 - 10.5 K/uL   RBC 4.58 3.87 - 5.11 MIL/uL   Hemoglobin 12.0 12.0 - 15.0 g/dL   HCT 40.3 36.0 - 46.0 %   MCV 88.0 78.0 - 100.0 fL   MCH 26.2 26.0 - 34.0 pg   MCHC 29.8 (L) 30.0 - 36.0 g/dL   RDW 15.2 11.5 - 15.5 %   Platelets 203 150 - 400 K/uL   Neutrophils Relative % 71 %   Neutro Abs 8.0 (H) 1.7 - 7.7 K/uL   Lymphocytes Relative 15 %   Lymphs Abs 1.7 0.7 - 4.0 K/uL   Monocytes Relative 12 %   Monocytes Absolute 1.3 (H) 0.1 - 1.0 K/uL   Eosinophils  Relative 2 %   Eosinophils Absolute 0.3 0.0 - 0.7 K/uL   Basophils Relative 0 %   Basophils Absolute 0.0 0.0 - 0.1 K/uL   Ct Abdomen Pelvis Wo Contrast Result Date: 05/22/2018 CLINICAL DATA:  Abdominal pain and diarrhea. Per previous CT reports, history of lung cancer status post immunotherapy. EXAM: CT ABDOMEN AND PELVIS WITHOUT CONTRAST TECHNIQUE: Multidetector CT imaging of the abdomen and pelvis was performed following  the standard protocol without IV contrast. COMPARISON:  None. FINDINGS: Lower chest: Again noted is the reticular ground-glass opacity at the LEFT lung base, incompletely imaged, similar to previous studies, described as post treatment changes on previous exams. RIGHT lung base is clear. Hepatobiliary: No focal liver abnormality is seen. Status post cholecystectomy. No biliary dilatation. Pancreas: Unremarkable. No pancreatic ductal dilatation or surrounding inflammatory changes. Spleen: Normal in size without focal abnormality. Adrenals/Urinary Tract: Adrenal glands appear normal. Kidneys are unremarkable without mass, stone or hydronephrosis. No ureteral or bladder calculi identified. Bladder appears normal. Stomach/Bowel: No dilated large or small bowel loops. Fairly extensive diverticulosis within the sigmoid and lower descending colon, but no focal inflammatory change to suggest acute diverticulitis. No bowel wall thickening or evidence of bowel wall inflammation in the abdomen or pelvis. Stomach is unremarkable, partially decompressed. Vascular/Lymphatic: Aortic atherosclerosis. No enlarged abdominal or pelvic lymph nodes. Reproductive: Presumed hysterectomy.  No adnexal mass or free fluid. Other: No free fluid or abscess collection. No free intraperitoneal air. Musculoskeletal: Degenerative changes throughout the thoracolumbar spine, mild to moderate in degree. No acute or suspicious osseous finding. IMPRESSION: 1. No acute findings within the abdomen or pelvis. No evidence of  metastasis. No bowel obstruction or evidence of bowel wall inflammation. No evidence of acute solid organ abnormality. No free fluid or abscess. 2. Colonic diverticulosis without evidence of acute diverticulitis. 3. Aortic atherosclerosis. Electronically Signed   By: Franki Cabot M.D.   On: 05/22/2018 17:53   Dg Chest 2 View Result Date: 05/22/2018 CLINICAL DATA:  abd pain, N/V/D, pt having lung cancer treatments now EXAM: CHEST - 2 VIEW COMPARISON:  Chest x-rays dated 02/05/2018 and 02/04/2018. FINDINGS: Stable volume loss in the LEFT hemithorax with basilar pleuroparenchymal scarring. No new lung findings. No pneumothorax seen. Heart size and mediastinal contours are stable. RIGHT chest wall Port-A-Cath is stable in position with tip at the level of the mid SVC. No acute or suspicious osseous finding. IMPRESSION: Stable chest x-ray. No active cardiopulmonary disease. No evidence of pneumonia or pulmonary edema. Electronically Signed   By: Franki Cabot M.D.   On: 05/22/2018 18:07     2030:  BUN/Cr c/w previous labs on file. No clear UTI on Udip and pt denies dysuria; UC pending. Workup reassuring. Pt has tol PO well while in the ED without N/V.  No stooling while in the ED.  Abd benign, VSS. Feels better and wants to go home now. Tx symptomatically at this time. Dx and testing d/w pt and family.  Questions answered.  Verb understanding, agreeable to d/c home with outpt f/u.    Final Clinical Impressions(s) / ED Diagnoses   Final diagnoses:  None    ED Discharge Orders    None       Francine Graven, DO 05/27/18 1600

## 2018-05-24 ENCOUNTER — Emergency Department (HOSPITAL_COMMUNITY): Payer: Medicare Other

## 2018-05-24 ENCOUNTER — Encounter (HOSPITAL_COMMUNITY): Payer: Self-pay | Admitting: Emergency Medicine

## 2018-05-24 ENCOUNTER — Other Ambulatory Visit: Payer: Self-pay

## 2018-05-24 ENCOUNTER — Inpatient Hospital Stay (HOSPITAL_COMMUNITY)
Admission: EM | Admit: 2018-05-24 | Discharge: 2018-06-04 | DRG: 178 | Disposition: A | Discharge status: Skilled Nursing Facility | Payer: Medicare Other | Attending: Family Medicine | Admitting: Family Medicine

## 2018-05-24 DIAGNOSIS — M810 Age-related osteoporosis without current pathological fracture: Secondary | ICD-10-CM | POA: Diagnosis present

## 2018-05-24 DIAGNOSIS — E1122 Type 2 diabetes mellitus with diabetic chronic kidney disease: Secondary | ICD-10-CM | POA: Diagnosis present

## 2018-05-24 DIAGNOSIS — N183 Chronic kidney disease, stage 3 unspecified: Secondary | ICD-10-CM | POA: Diagnosis present

## 2018-05-24 DIAGNOSIS — E785 Hyperlipidemia, unspecified: Secondary | ICD-10-CM | POA: Diagnosis present

## 2018-05-24 DIAGNOSIS — E870 Hyperosmolality and hypernatremia: Secondary | ICD-10-CM | POA: Diagnosis not present

## 2018-05-24 DIAGNOSIS — I5032 Chronic diastolic (congestive) heart failure: Secondary | ICD-10-CM | POA: Diagnosis not present

## 2018-05-24 DIAGNOSIS — R498 Other voice and resonance disorders: Secondary | ICD-10-CM | POA: Diagnosis not present

## 2018-05-24 DIAGNOSIS — Z9049 Acquired absence of other specified parts of digestive tract: Secondary | ICD-10-CM | POA: Diagnosis not present

## 2018-05-24 DIAGNOSIS — Z833 Family history of diabetes mellitus: Secondary | ICD-10-CM

## 2018-05-24 DIAGNOSIS — R109 Unspecified abdominal pain: Secondary | ICD-10-CM | POA: Diagnosis not present

## 2018-05-24 DIAGNOSIS — E871 Hypo-osmolality and hyponatremia: Secondary | ICD-10-CM | POA: Diagnosis present

## 2018-05-24 DIAGNOSIS — I248 Other forms of acute ischemic heart disease: Secondary | ICD-10-CM | POA: Diagnosis present

## 2018-05-24 DIAGNOSIS — Z7982 Long term (current) use of aspirin: Secondary | ICD-10-CM | POA: Diagnosis not present

## 2018-05-24 DIAGNOSIS — K529 Noninfective gastroenteritis and colitis, unspecified: Secondary | ICD-10-CM | POA: Diagnosis not present

## 2018-05-24 DIAGNOSIS — Z87891 Personal history of nicotine dependence: Secondary | ICD-10-CM

## 2018-05-24 DIAGNOSIS — Z9981 Dependence on supplemental oxygen: Secondary | ICD-10-CM

## 2018-05-24 DIAGNOSIS — R000 Tachycardia, unspecified: Secondary | ICD-10-CM | POA: Diagnosis not present

## 2018-05-24 DIAGNOSIS — I130 Hypertensive heart and chronic kidney disease with heart failure and stage 1 through stage 4 chronic kidney disease, or unspecified chronic kidney disease: Secondary | ICD-10-CM | POA: Diagnosis not present

## 2018-05-24 DIAGNOSIS — M6281 Muscle weakness (generalized): Secondary | ICD-10-CM | POA: Diagnosis not present

## 2018-05-24 DIAGNOSIS — E119 Type 2 diabetes mellitus without complications: Secondary | ICD-10-CM

## 2018-05-24 DIAGNOSIS — K56609 Unspecified intestinal obstruction, unspecified as to partial versus complete obstruction: Secondary | ICD-10-CM

## 2018-05-24 DIAGNOSIS — Z794 Long term (current) use of insulin: Secondary | ICD-10-CM

## 2018-05-24 DIAGNOSIS — K56600 Partial intestinal obstruction, unspecified as to cause: Secondary | ICD-10-CM

## 2018-05-24 DIAGNOSIS — D72829 Elevated white blood cell count, unspecified: Secondary | ICD-10-CM

## 2018-05-24 DIAGNOSIS — R1312 Dysphagia, oropharyngeal phase: Secondary | ICD-10-CM | POA: Diagnosis not present

## 2018-05-24 DIAGNOSIS — G20 Parkinson's disease: Secondary | ICD-10-CM | POA: Diagnosis not present

## 2018-05-24 DIAGNOSIS — M1990 Unspecified osteoarthritis, unspecified site: Secondary | ICD-10-CM | POA: Diagnosis present

## 2018-05-24 DIAGNOSIS — J449 Chronic obstructive pulmonary disease, unspecified: Secondary | ICD-10-CM | POA: Diagnosis present

## 2018-05-24 DIAGNOSIS — J690 Pneumonitis due to inhalation of food and vomit: Principal | ICD-10-CM | POA: Diagnosis present

## 2018-05-24 DIAGNOSIS — K921 Melena: Secondary | ICD-10-CM | POA: Diagnosis not present

## 2018-05-24 DIAGNOSIS — Z4659 Encounter for fitting and adjustment of other gastrointestinal appliance and device: Secondary | ICD-10-CM

## 2018-05-24 DIAGNOSIS — J439 Emphysema, unspecified: Secondary | ICD-10-CM | POA: Diagnosis not present

## 2018-05-24 DIAGNOSIS — J189 Pneumonia, unspecified organism: Secondary | ICD-10-CM | POA: Diagnosis present

## 2018-05-24 DIAGNOSIS — J984 Other disorders of lung: Secondary | ICD-10-CM

## 2018-05-24 DIAGNOSIS — I252 Old myocardial infarction: Secondary | ICD-10-CM

## 2018-05-24 DIAGNOSIS — K319 Disease of stomach and duodenum, unspecified: Secondary | ICD-10-CM | POA: Diagnosis present

## 2018-05-24 DIAGNOSIS — Z4682 Encounter for fitting and adjustment of non-vascular catheter: Secondary | ICD-10-CM | POA: Diagnosis not present

## 2018-05-24 DIAGNOSIS — R0602 Shortness of breath: Secondary | ICD-10-CM

## 2018-05-24 DIAGNOSIS — I8500 Esophageal varices without bleeding: Secondary | ICD-10-CM | POA: Diagnosis present

## 2018-05-24 DIAGNOSIS — Z8249 Family history of ischemic heart disease and other diseases of the circulatory system: Secondary | ICD-10-CM

## 2018-05-24 DIAGNOSIS — R140 Abdominal distension (gaseous): Secondary | ICD-10-CM | POA: Diagnosis not present

## 2018-05-24 DIAGNOSIS — R279 Unspecified lack of coordination: Secondary | ICD-10-CM | POA: Diagnosis not present

## 2018-05-24 DIAGNOSIS — Z85118 Personal history of other malignant neoplasm of bronchus and lung: Secondary | ICD-10-CM

## 2018-05-24 DIAGNOSIS — I110 Hypertensive heart disease with heart failure: Secondary | ICD-10-CM | POA: Diagnosis not present

## 2018-05-24 DIAGNOSIS — K2951 Unspecified chronic gastritis with bleeding: Secondary | ICD-10-CM | POA: Diagnosis not present

## 2018-05-24 DIAGNOSIS — R7989 Other specified abnormal findings of blood chemistry: Secondary | ICD-10-CM | POA: Diagnosis present

## 2018-05-24 DIAGNOSIS — I10 Essential (primary) hypertension: Secondary | ICD-10-CM | POA: Diagnosis present

## 2018-05-24 DIAGNOSIS — Z7983 Long term (current) use of bisphosphonates: Secondary | ICD-10-CM

## 2018-05-24 DIAGNOSIS — I2510 Atherosclerotic heart disease of native coronary artery without angina pectoris: Secondary | ICD-10-CM | POA: Diagnosis not present

## 2018-05-24 DIAGNOSIS — Z7189 Other specified counseling: Secondary | ICD-10-CM

## 2018-05-24 DIAGNOSIS — G20A1 Parkinson's disease without dyskinesia, without mention of fluctuations: Secondary | ICD-10-CM | POA: Diagnosis present

## 2018-05-24 DIAGNOSIS — K5730 Diverticulosis of large intestine without perforation or abscess without bleeding: Secondary | ICD-10-CM | POA: Diagnosis not present

## 2018-05-24 DIAGNOSIS — Z7401 Bed confinement status: Secondary | ICD-10-CM | POA: Diagnosis not present

## 2018-05-24 DIAGNOSIS — K2960 Other gastritis without bleeding: Secondary | ICD-10-CM | POA: Diagnosis not present

## 2018-05-24 DIAGNOSIS — K567 Ileus, unspecified: Secondary | ICD-10-CM | POA: Diagnosis not present

## 2018-05-24 DIAGNOSIS — R748 Abnormal levels of other serum enzymes: Secondary | ICD-10-CM | POA: Diagnosis not present

## 2018-05-24 DIAGNOSIS — Z90710 Acquired absence of both cervix and uterus: Secondary | ICD-10-CM

## 2018-05-24 DIAGNOSIS — R778 Other specified abnormalities of plasma proteins: Secondary | ICD-10-CM | POA: Diagnosis present

## 2018-05-24 DIAGNOSIS — C3490 Malignant neoplasm of unspecified part of unspecified bronchus or lung: Secondary | ICD-10-CM

## 2018-05-24 DIAGNOSIS — E860 Dehydration: Secondary | ICD-10-CM | POA: Diagnosis present

## 2018-05-24 DIAGNOSIS — I8510 Secondary esophageal varices without bleeding: Secondary | ICD-10-CM | POA: Diagnosis not present

## 2018-05-24 DIAGNOSIS — R1084 Generalized abdominal pain: Secondary | ICD-10-CM | POA: Diagnosis not present

## 2018-05-24 DIAGNOSIS — E1165 Type 2 diabetes mellitus with hyperglycemia: Secondary | ICD-10-CM | POA: Diagnosis not present

## 2018-05-24 DIAGNOSIS — R1310 Dysphagia, unspecified: Secondary | ICD-10-CM | POA: Diagnosis present

## 2018-05-24 LAB — TROPONIN I
Troponin I: 0.28 ng/mL (ref <0.03)
Troponin I: 0.25 ng/mL (ref <0.03)

## 2018-05-24 LAB — COMPREHENSIVE METABOLIC PANEL
ALT: 14 U/L (ref 14–54)
AST: 14 U/L — ABNORMAL LOW (ref 15–41)
Albumin: 3.5 g/dL (ref 3.5–5.0)
Alkaline Phosphatase: 66 U/L (ref 38–126)
Anion gap: 10 (ref 5–15)
BUN: 12 mg/dL (ref 6–20)
CO2: 29 mmol/L (ref 22–32)
Calcium: 9.3 mg/dL (ref 8.9–10.3)
Chloride: 93 mmol/L — ABNORMAL LOW (ref 101–111)
Creatinine, Ser: 1.16 mg/dL — ABNORMAL HIGH (ref 0.44–1.00)
GFR calc Af Amer: 49 mL/min — ABNORMAL LOW (ref >60)
GFR calc non Af Amer: 43 mL/min — ABNORMAL LOW (ref >60)
Glucose, Bld: 158 mg/dL — ABNORMAL HIGH (ref 65–99)
Potassium: 4.5 mmol/L (ref 3.5–5.1)
Sodium: 132 mmol/L — ABNORMAL LOW (ref 135–145)
Total Bilirubin: 1 mg/dL (ref 0.3–1.2)
Total Protein: 7.2 g/dL (ref 6.5–8.1)

## 2018-05-24 LAB — URINALYSIS, ROUTINE W REFLEX MICROSCOPIC
Bilirubin Urine: NEGATIVE
Glucose, UA: NEGATIVE mg/dL
Ketones, ur: NEGATIVE mg/dL
Nitrite: NEGATIVE
Protein, ur: NEGATIVE mg/dL
Specific Gravity, Urine: 1.038 — ABNORMAL HIGH (ref 1.005–1.030)
WBC, UA: >50 WBC/hpf — ABNORMAL HIGH (ref 0–5)
pH: 6 (ref 5.0–8.0)

## 2018-05-24 LAB — CBC
HCT: 38.8 % (ref 36.0–46.0)
Hemoglobin: 12.1 g/dL (ref 12.0–15.0)
MCH: 26.7 pg (ref 26.0–34.0)
MCHC: 31.2 g/dL (ref 30.0–36.0)
MCV: 85.7 fL (ref 78.0–100.0)
Platelets: 178 10*3/uL (ref 150–400)
RBC: 4.53 MIL/uL (ref 3.87–5.11)
RDW: 15.2 % (ref 11.5–15.5)
WBC: 21.6 10*3/uL — ABNORMAL HIGH (ref 4.0–10.5)

## 2018-05-24 LAB — LIPASE, BLOOD: Lipase: 24 U/L (ref 11–51)

## 2018-05-24 IMAGING — CT CT CHEST W/ CM
3 of 5 series · 14 of 36 positions shown, 17 images · IV contrast (Isovue)
Comparison: 10/08/2017

CLINICAL DATA: Restaging lung carcinoma.  Immunotherapy ongoing.

EXAM:
CT CHEST, ABDOMEN, AND PELVIS WITH CONTRAST
TECHNIQUE: Multidetector CT imaging of the chest, abdomen and pelvis was
performed following the standard protocol during bolus
administration of intravenous contrast.
CONTRAST:  75mL LQQU11-KYY IOPAMIDOL (LQQU11-KYY) INJECTION 61%

[Series 2: cap with · axial · 0.68mm/px · z∈[+1126,+1586]mm · 9 of 116 slices shown, 12 images]
[im 12/116  mediastinal]
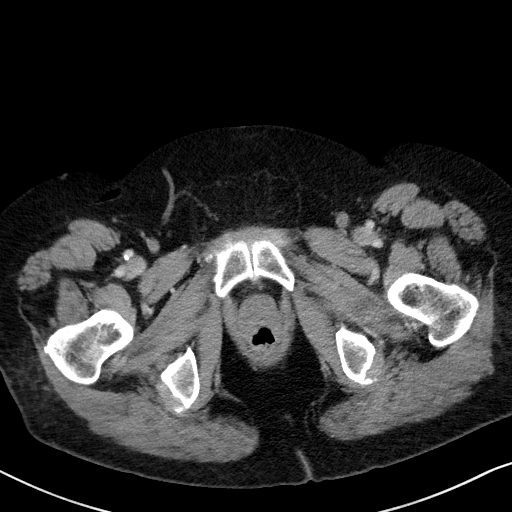
[im 12/116  lung]
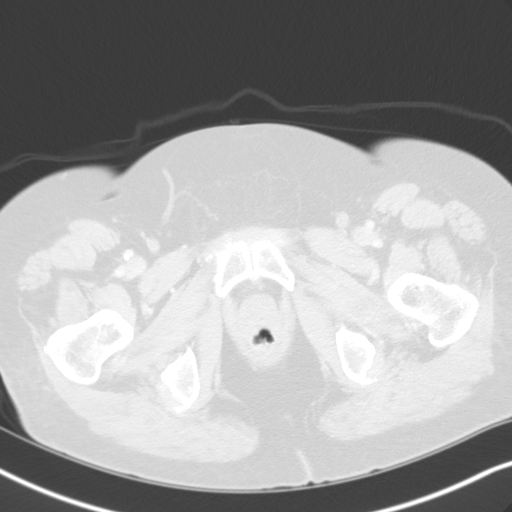
[im 24/116  lung]
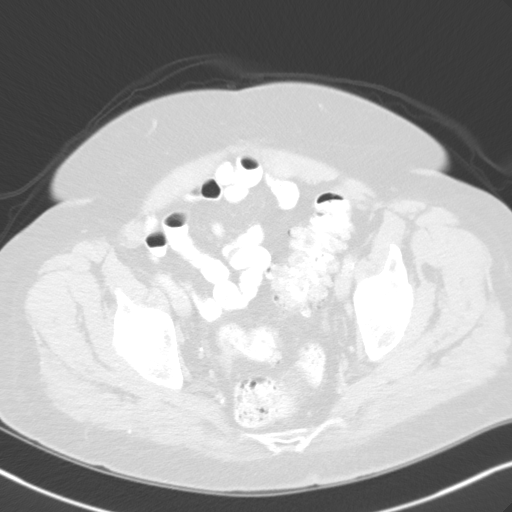
[im 35/116  lung]
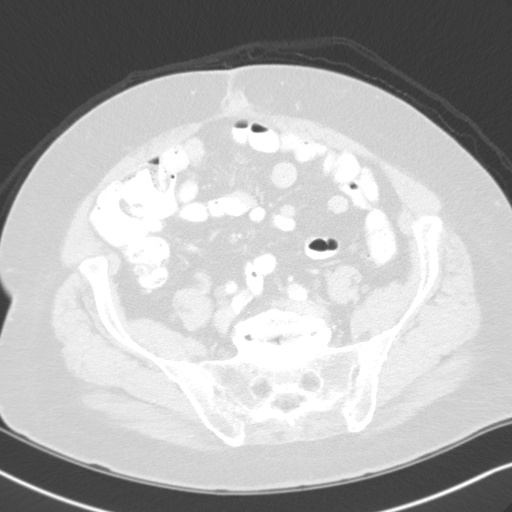
[im 47/116  lung]
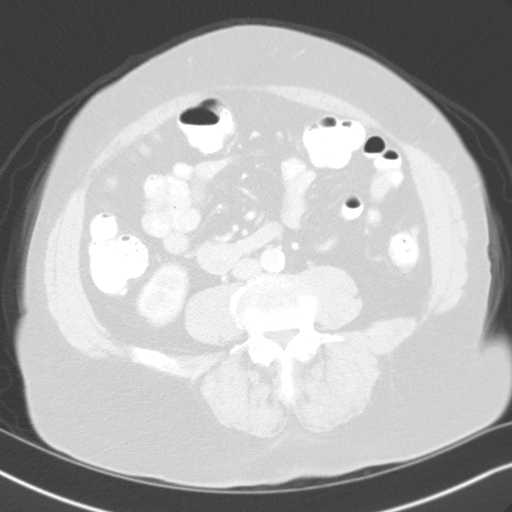
[im 58/116  mediastinal]
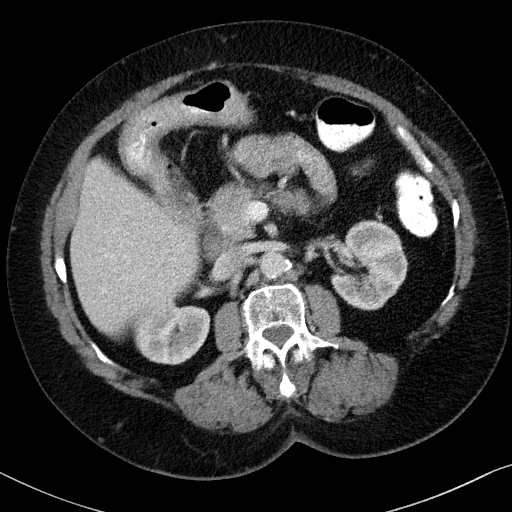
[im 58/116  lung]
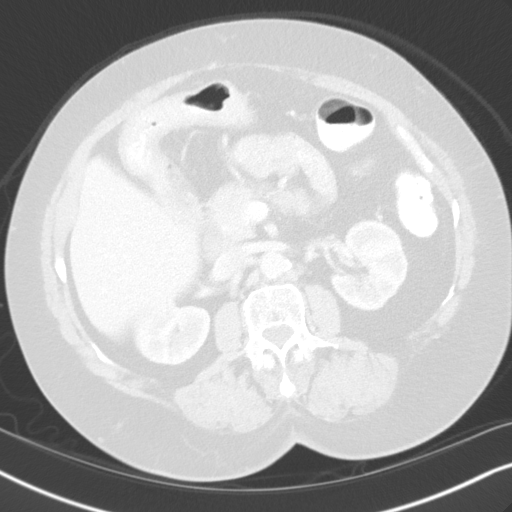
[im 70/116  lung]
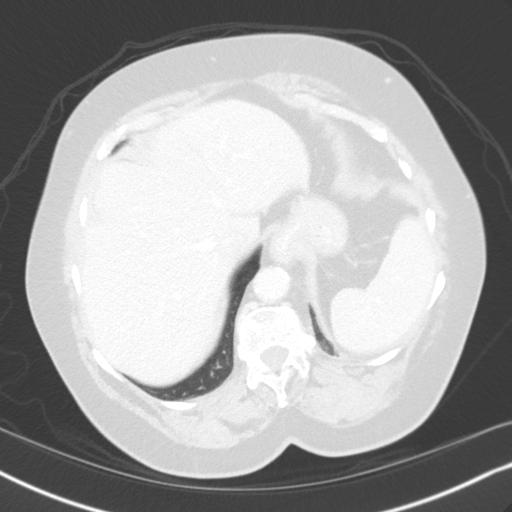
[im 81/116  lung]
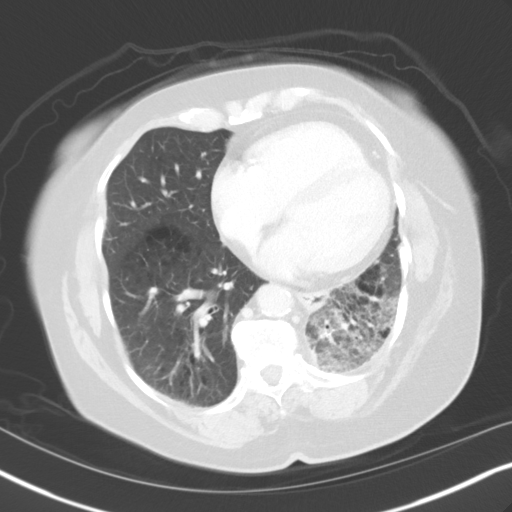
[im 93/116  lung]
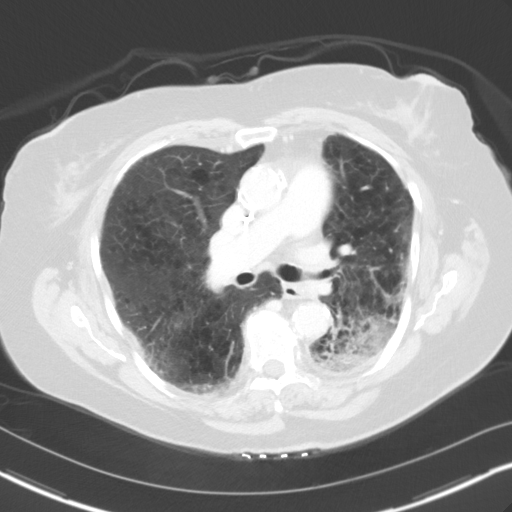
[im 104/116  mediastinal]
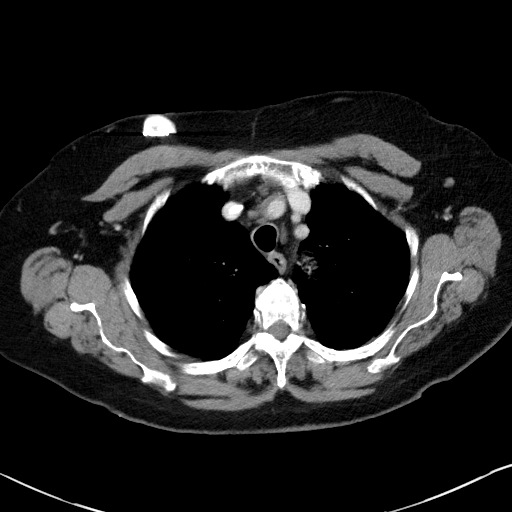
[im 104/116  lung]
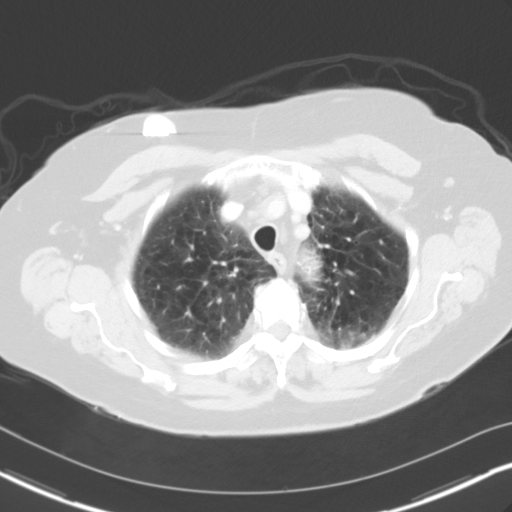

[Series 3: coronals · coronal · 0.71mm/px · 3 of 159 slices shown]
[im 32/159  lung]
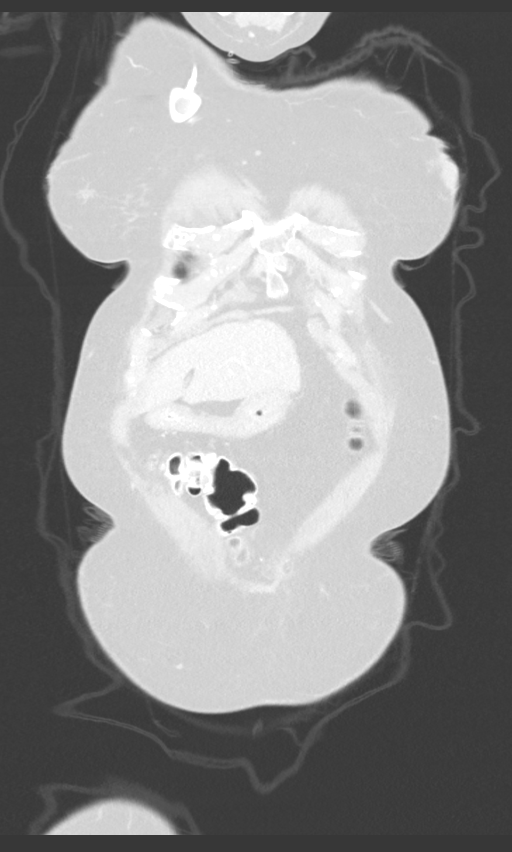
[im 64/159  lung]
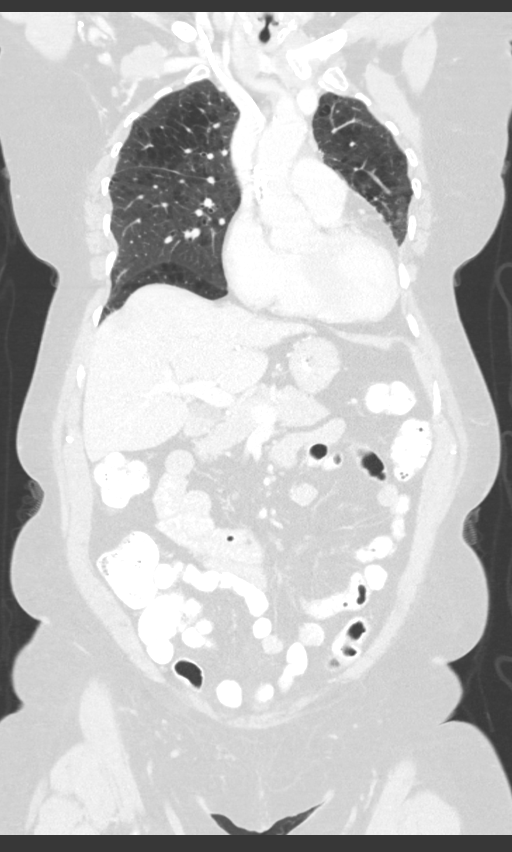
[im 95/159  lung]
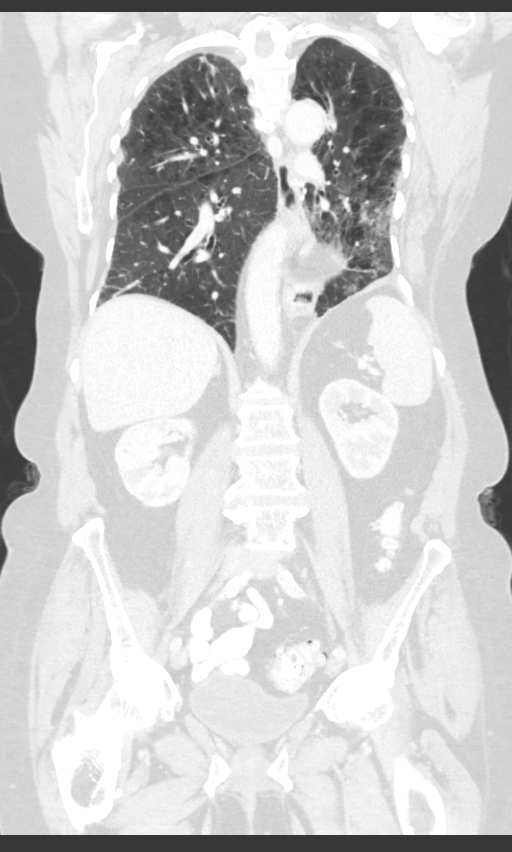

[Series 6: lung · axial · 0.68mm/px · z∈[+1382,+1430]mm · 2 of 144 slices shown]
[im 12/144  lung]
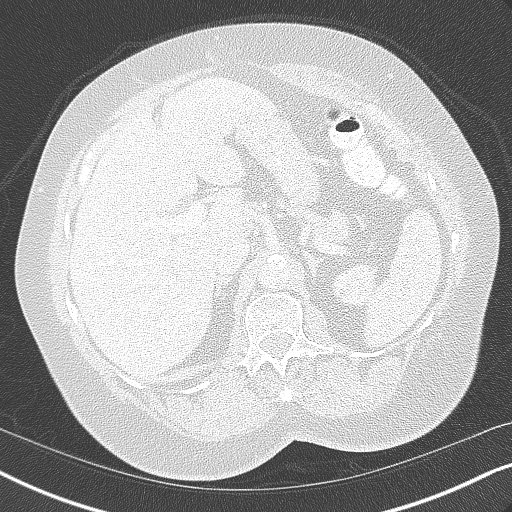
[im 36/144  lung]
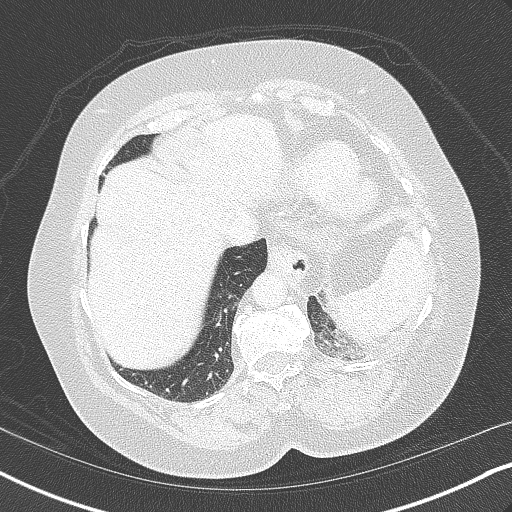

[14 of 36 positions shown; findings below may reference images not displayed]

FINDINGS: CT CHEST FINDINGS

Cardiovascular: Port in the anterior chest wall with tip in distal
SVC. Coronary artery calcification and aortic atherosclerotic
calcification.

Mediastinum/Nodes: No axillary or supraclavicular adenopathy. No
mediastinal hilar adenopathy.

Lungs/Pleura: Centrilobular emphysema in the upper lobes. Reticular
pattern with ground-glass opacity in the LEFT lower lobe which is
not changed from comparison exam. There is volume loss in the LEFT
hemithorax. No discrete nodularity. The RIGHT lung apex there is
nodular scarring. No new lesions.

Musculoskeletal: No aggressive osseous lesion.

CT ABDOMEN AND PELVIS FINDINGS

Hepatobiliary: No focal hepatic lesion. Postcholecystectomy. No
biliary dilatation.

Pancreas: Pancreas is normal. No ductal dilatation. No pancreatic
inflammation.

Spleen: Normal spleen

Adrenals/urinary tract: Adrenal glands and kidneys are normal. The
ureters and bladder normal.

Stomach/Bowel: Stomach, duodenum, small-bowel cecum normal. Several
diverticula scattered throughout the colon. No acute inflammation.
Multiple diverticula through the sigmoid colon region. Rectum
normal.

Vascular/Lymphatic: Abdominal aorta is normal caliber with
atherosclerotic calcification. There is no retroperitoneal or
periportal lymphadenopathy. No pelvic lymphadenopathy.

Reproductive: Post hysterectomy

Other: No free fluid.

Musculoskeletal: No aggressive osseous lesion.
IMPRESSION: Chest Impression:

1. Post therapy change in the LEFT hemithorax.
2. No evidence of lung cancer recurrence.

Abdomen / Pelvis Impression:

1. No evidence of metastatic disease in the abdomen pelvis.
2.  Atherosclerotic calcification of the aorta.
3. Diverticulosis without evidence diverticulitis.

## 2018-05-24 MED ORDER — IOPAMIDOL (ISOVUE-370) INJECTION 76%
80.0000 mL | Freq: Once | INTRAVENOUS | Status: AC | PRN
  Administered 2018-05-24: 20:00:00 via INTRAVENOUS

## 2018-05-24 MED ORDER — SODIUM CHLORIDE 0.9 % IV BOLUS
500.0000 mL | Freq: Once | INTRAVENOUS | Status: AC
  Administered 2018-05-24: 500 mL via INTRAVENOUS

## 2018-05-24 MED ORDER — SODIUM CHLORIDE 0.9 % IV SOLN
2.0000 g | Freq: Once | INTRAVENOUS | Status: AC
  Administered 2018-05-24: 2 g via INTRAVENOUS
  Filled 2018-05-24: qty 2

## 2018-05-24 MED ORDER — IPRATROPIUM-ALBUTEROL 0.5-2.5 (3) MG/3ML IN SOLN
3.0000 mL | Freq: Once | RESPIRATORY_TRACT | Status: AC
  Administered 2018-05-24: 3 mL via RESPIRATORY_TRACT
  Filled 2018-05-24: qty 3

## 2018-05-24 MED ORDER — VANCOMYCIN HCL IN DEXTROSE 1-5 GM/200ML-% IV SOLN
1000.0000 mg | Freq: Once | INTRAVENOUS | Status: AC
  Administered 2018-05-24: 1000 mg via INTRAVENOUS
  Filled 2018-05-24: qty 200

## 2018-05-24 MED ORDER — PROMETHAZINE HCL 25 MG/ML IJ SOLN
12.5000 mg | Freq: Once | INTRAMUSCULAR | Status: AC
  Administered 2018-05-24: 12.5 mg via INTRAVENOUS
  Filled 2018-05-24: qty 1

## 2018-05-24 MED ORDER — DICYCLOMINE HCL 10 MG PO CAPS
10.0000 mg | ORAL_CAPSULE | Freq: Once | ORAL | Status: AC
  Administered 2018-05-24: 10 mg via ORAL
  Filled 2018-05-24: qty 1

## 2018-05-24 MED ORDER — IPRATROPIUM-ALBUTEROL 0.5-2.5 (3) MG/3ML IN SOLN: 3.0000 mL | RESPIRATORY_TRACT | Status: DC | PRN

## 2018-05-24 NOTE — ED Notes (Signed)
Pt is currently on 2L of oxygen [place] with a pulse ox of 98%

## 2018-05-24 NOTE — ED Notes (Signed)
Pt hard stick Unable to obtain cultures at this time EDP notified, if cultures are not obtained within the next 20 minutes this nurse will start antibiotics. If unable to obtain two cultures EDP states one will be enough. Via EDP Jalecia Leon

## 2018-05-24 NOTE — ED Triage Notes (Signed)
Pt reports shortness of breath and abd pain x 4 days.  Pt states she was seen for same 2 days ago.  States the s/s are getting worse.

## 2018-05-24 NOTE — ED Provider Notes (Signed)
Emergency Department Provider Note   I have reviewed the triage vital signs and the nursing notes.   HISTORY  Chief Complaint Abdominal Pain and Shortness of Breath   HPI Angela Lawson is a 84 y.o. female with PMH of adenocarcinoma of the lung currently on chemo, COPD, CHF, HLD, HTN, and DM returns to the emergency department for evaluation of continued diffuse abdominal discomfort and shortness of breath.  She denies associated chest pain.  Was seen in the emergency department 2 days ago and had a CT scan of the abdomen and pelvis at that time along with chest x-ray which were unremarkable.  The patient tells me that she received medications in the emergency department was not feeling much better at the time of discharge.  She reports a loss of appetite.  Her last chemo was last week.  She has not discussed these concerns with her oncologist or PCP.  She had 2 episodes of vomiting today so return to the emergency department.  No blood in the emesis.  She did have one episode of nonbloody diarrhea.  No sick contacts or recent travels.  No radiation of symptoms or modifying factors.  Past Medical History:  Diagnosis Date  . Adenocarcinoma of lung ([place])    Left lung 2009, resected  . Anginal pain ([place])   . Arthritis   . Back pain   . CHF (congestive heart failure) ([place])   . COPD (chronic obstructive pulmonary disease) ([place])   . Diverticulitis   . Dyslipidemia   . Essential hypertension   . H/O ventral hernia   . Non-obstructive CAD    a. 04/2015 NSTEMI/Cath: LAD 10p, LCX 74m, RCA 52m, 20d, EF 35-40 w/ apical ballooning.  . On home O2    3L N/C  . Osteoporosis   . Osteoporosis 11/04/2015   Managed by Dr. Legrand Rams   . Parkinson's disease ([place])   . Shortness of breath   . Takotsubo cardiomyopathy    a. 04/2015 Echo: EF 45-50%, mid-dist anterior/apical/inferoapical HK w/ hyperdynamic base. Gr 1 DD, mild AI, mild-mod MR, triv TR, PASP 61mmHg;  b. 04/2015 LV gram: Ef 35-40% w/ apical  ballooning.  . Type II diabetes mellitus ([place])   . Ventricular bigeminy    a. 04/2015 in setting of NSTEMI/Takotsubo.    Patient Active Problem List   Diagnosis Date Noted  . Abdominal distention 05/25/2018  . HCAP (healthcare-associated pneumonia) 05/24/2018  . CAD (coronary artery disease) 05/24/2018  . Parkinson's disease ([place]) 05/24/2018  . Hyponatremia 05/24/2018  . Respiratory failure ([place]) 02/06/2018  . Acute respiratory failure ([place]) 12/04/2017  . Community acquired pneumonia of left lung 07/19/2017  . Critical lower limb ischemia 07/07/2017  . Counseling regarding advanced care planning and goals of care 05/15/2017  . Acute renal injury ([place]) 04/04/2017  . Chest pain 04/04/2017  . Nausea and vomiting 04/04/2017  . COPD with acute exacerbation ([place]) 04/25/2016  . Acute on chronic respiratory failure with hypoxia ([place]) 04/25/2016  . SOB (shortness of breath)   . Osteoporosis 11/04/2015  . Chronic diastolic CHF (congestive heart failure) ([place]) 08/01/2015  . CHF exacerbation ([place]) 07/09/2015  . Essential hypertension 04/19/2015  . Type II diabetes mellitus ([place]) 04/19/2015  . Dyslipidemia 04/19/2015  . History of lung cancer 04/19/2015  . Takotsubo cardiomyopathy 04/19/2015  . Ventricular bigeminy 04/19/2015  . NSTEMI (non-ST elevated myocardial infarction) ([place]) 04/19/2015  . Stress-induced cardiomyopathy 04/18/2015  . Elevated troponin 04/16/2015  . COPD exacerbation ([place]) 04/16/2015  . CKD (  chronic kidney disease) stage 3, GFR 30-59 ml/min ([facility]) 04/16/2015  . Herpes genitalis in women 11/24/2014  . Toe fracture 09/28/2014  . Non-traumatic tear of right rotator cuff 11/30/2013  . Pain in joint, shoulder region 11/30/2013  . Muscle weakness (generalized) 11/30/2013  . Bursitis of left shoulder 02/09/2013  . Adenocarcinoma of lung ([place]) 08/20/2012    Past Surgical History:  Procedure Laterality Date  . ABDOMINAL HYSTERECTOMY    . CARDIAC CATHETERIZATION N/A  04/17/2015   Procedure: Left Heart Cath and Coronary Angiography;  Surgeon: Troy Sine, MD;  Location: [facility];  Service: Cardiovascular;  Laterality: N/A;  . CHOLECYSTECTOMY    . COLONOSCOPY N/A 09/18/2014   Procedure: COLONOSCOPY;  Surgeon: Danie Binder, MD;  Location: [facility];  Service: Endoscopy;  Laterality: N/A;  8:30 AM - moved to 10:30 Rosendo Gros to notify pt  . ECTOPIC PREGNANCY SURGERY    . INCISIONAL HERNIA REPAIR N/A 08/26/2013   Procedure: HERNIA REPAIR INCISIONAL WITH MESH;  Surgeon: Jamesetta So, MD;  Location: [facility];  Service: General;  Laterality: N/A;  . IR FLUORO GUIDE PORT INSERTION RIGHT  04/30/2017  . IR US GUIDE VASC ACCESS RIGHT  04/30/2017  . LUNG CANCER SURGERY    . VIDEO BRONCHOSCOPY WITH ENDOBRONCHIAL NAVIGATION N/A 04/23/2017   Procedure: VIDEO BRONCHOSCOPY WITH ENDOBRONCHIAL NAVIGATION;  Surgeon: Melrose Nakayama, MD;  Location: [facility];  Service: Thoracic;  Laterality: N/A;  . VIDEO BRONCHOSCOPY WITH ENDOBRONCHIAL ULTRASOUND N/A 04/23/2017   Procedure: VIDEO BRONCHOSCOPY WITH ENDOBRONCHIAL ULTRASOUND;  Surgeon: Melrose Nakayama, MD;  Location: [facility];  Service: Thoracic;  Laterality: N/A;      Allergies Lisinopril  Family History  Problem Relation Age of Onset  . Diabetes Mother   . Hypertension Mother   . Diabetes Father   . Asthma Unknown   . Cancer Unknown   . Heart attack Sister        X2  . Colon cancer Neg Hx     Social History Social History   Tobacco Use  . Smoking status: Former Smoker    Years: 20.00    Types: Cigarettes    Last attempt to quit: 08/13/2006    Years since quitting: 11.7  . Smokeless tobacco: Never Used  Substance Use Topics  . Alcohol use: No    Alcohol/week: 0.0 oz  . Drug use: No    Review of Systems  Constitutional: No fever/chills Eyes: No visual changes. ENT: No sore throat. Cardiovascular: Denies chest pain. Respiratory: Positive shortness of breath. Gastrointestinal:  Positive diffuse abdominal pain. Positive nausea, vomiting, and diarrhea.  No constipation. Genitourinary: Negative for dysuria. Musculoskeletal: Negative for back pain. Skin: Negative for rash. Neurological: Negative for headaches, focal weakness or numbness.  10-point ROS otherwise negative.  ____________________________________________   PHYSICAL EXAM:  VITAL SIGNS: ED Triage Vitals [05/24/18 1733]  Enc Vitals Group     BP (!) 154/63     Pulse Rate (!) 102     Resp (!) 22     Temp 98.4 F (36.9 C)     Temp Source Oral     SpO2 90 %     Weight 154 lb (69.9 kg)     Height 5\' 4"  (1.626 m)     Pain Score 10   Constitutional: Alert and oriented. Well appearing and in no acute distress. Eyes: Conjunctivae are normal.  Head: Atraumatic. Nose: No congestion/rhinnorhea. Mouth/Throat: Mucous membranes are dry.  Neck: No stridor.  Cardiovascular: Tachycardia. Good peripheral circulation. Grossly normal heart sounds.   Respiratory: Normal respiratory effort.  No retractions. Lungs CTAB. Gastrointestinal: Soft with mild diffuse tenderness. No focal tenderness, rebound, or guarding. No distention.  Musculoskeletal: No lower extremity tenderness nor edema. No gross deformities of extremities. Neurologic:  Normal speech and language. No gross focal neurologic deficits are appreciated.  Skin:  Skin is warm, dry and intact. No rash noted.  ____________________________________________   LABS (all labs ordered are listed, but only abnormal results are displayed)  Labs Reviewed  COMPREHENSIVE METABOLIC PANEL - Abnormal; Notable for the following components:      Result Value   Sodium 132 (*)    Chloride 93 (*)    Glucose, Bld 158 (*)    Creatinine, Ser 1.16 (*)    AST 14 (*)    GFR calc non Af Amer 43 (*)    GFR calc Af Amer 49 (*)    All other components within normal limits  CBC - Abnormal; Notable for the following components:   WBC 21.6 (*)    All other components within  normal limits  URINALYSIS, ROUTINE W REFLEX MICROSCOPIC - Abnormal; Notable for the following components:   APPearance CLOUDY (*)    Specific Gravity, Urine 1.038 (*)    Hgb urine dipstick SMALL (*)    Leukocytes, UA LARGE (*)    WBC, UA >50 (*)    Bacteria, UA RARE (*)    Non Squamous Epithelial 0-5 (*)    All other components within normal limits  TROPONIN I - Abnormal; Notable for the following components:   Troponin I 0.28 (*)    All other components within normal limits  TROPONIN I - Abnormal; Notable for the following components:   Troponin I 0.25 (*)    All other components within normal limits  TROPONIN I - Abnormal; Notable for the following components:   Troponin I 0.28 (*)    All other components within normal limits  CBC WITH DIFFERENTIAL/PLATELET - Abnormal; Notable for the following components:   WBC 20.1 (*)    Neutro Abs 16.9 (*)    Monocytes Absolute 2.0 (*)    All other components within normal limits  BASIC METABOLIC PANEL - Abnormal; Notable for the following components:   Sodium 130 (*)    Chloride 96 (*)    Glucose, Bld 156 (*)    Creatinine, Ser 1.10 (*)    GFR calc non Af Amer 45 (*)    GFR calc Af Amer 53 (*)    All other components within normal limits  CULTURE, BLOOD (ROUTINE X 2)  CULTURE, BLOOD (ROUTINE X 2)  MRSA PCR SCREENING  URINE CULTURE  CULTURE, EXPECTORATED SPUTUM-ASSESSMENT  GRAM STAIN  LIPASE, BLOOD  TROPONIN I  STREP PNEUMONIAE URINARY ANTIGEN   ____________________________________________  EKG   EKG Interpretation  Date/Time:  Monday May 24 2018 18:10:20 EDT Ventricular Rate:  96 PR Interval:    QRS Duration: 83 QT Interval:  355 QTC Calculation: 449 R Axis:   28 Text Interpretation:  Sinus rhythm Anteroseptal infarct, age indeterminate No STEMI.  Confirmed by Nanda Quinton [phone]) on 05/24/2018 7:07:04 PM       ____________________________________________  RADIOLOGY  Dg Chest 2 View  Result Date:  05/24/2018 CLINICAL DATA:  84 year old female with shortness of breath and abdominal pain for 4 days. Treated left lung cancer. EXAM: CHEST - 2 VIEW COMPARISON:  Chest radiograph 05/22/2018. Restaging CT chest abdomen and pelvis 03/15/2018, and earlier. FINDINGS:  Upright AP and lateral views of the chest. Chronic architectural distortion and volume loss in the left lower lung. Stable lung volumes and mediastinal contours. Stable right chest porta cath. Calcified aortic atherosclerosis. No pneumothorax, pulmonary edema, pleural effusion or acute pulmonary opacity. Stable visualized osseous structures. Negative visible bowel gas pattern. IMPRESSION: Stable post treatment appearance of the chest. No new cardiopulmonary abnormality. Electronically Signed   By: Genevie Ann M.D.   On: 05/24/2018 19:23   Ct Angio Chest Pe W And/or Wo Contrast  Result Date: 05/24/2018 CLINICAL DATA:  84 y/o F; shortness of breath and abdominal pain for 4 days. Worsening symptoms. History of lung cancer post resection. EXAM: CT ANGIOGRAPHY CHEST WITH CONTRAST TECHNIQUE: Multidetector CT imaging of the chest was performed using the standard protocol during bolus administration of intravenous contrast. Multiplanar CT image reconstructions and MIPs were obtained to evaluate the vascular anatomy. CONTRAST:  80 cc Isovue 370 COMPARISON:  03/15/2018 CT chest. FINDINGS: Cardiovascular: Satisfactory opacification of the pulmonary arteries to the segmental level. No evidence of pulmonary embolism. Normal heart size. No pericardial effusion. Moderate calcific atherosclerosis of the coronary arteries and aorta. Mediastinum/Nodes: No enlarged mediastinal, hilar, or axillary lymph nodes. Thyroid gland, trachea, and esophagus demonstrate no significant findings. Small hiatal hernia. Lungs/Pleura: Stable postsurgical changes the left inferior hilum and lung base. Stable thick rimmed extrapleural cavity the left lower as ago esophageal recess prior surgery.  Increasing ground-glass opacification with patchy areas of consolidation of the dependent left lung may represent progressive posttreatment changes or superimposed pneumonitis. No pleural effusion or interval pneumothorax. There are several small 3-4 mm nodules at the periphery of the lungs which are stable from prior study. Stable scarring in the right lung apex. Stable emphysema. Upper Abdomen: No acute abnormality. Musculoskeletal: Multiple chronic bilateral rib deformities from prior surgery and/or trauma. No acute fracture identified. Review of the MIP images confirms the above findings. IMPRESSION: 1. No pulmonary embolus identified. 2. Increasing ground-glass opacification and patchy areas of consolidation in the dependent left lung may represent progressive posttreatment changes or superimposed pneumonitis. No gross mass lesion to suggest recurrent disease. 3. Moderate aortic and coronary calcific atherosclerosis. 4. Small hiatal hernia. 5. Stable postsurgical changes in the left infrahilar region. Electronically Signed   By: Kristine Garbe M.D.   On: 05/24/2018 20:37   Ct Abdomen Pelvis W Contrast  Result Date: 05/25/2018 CLINICAL DATA:  Worsening abdominal pain, vomiting and distension for 2 days. History of lung cancer, COPD, hysterectomy, cholecystectomy, diverticulosis. EXAM: CT ABDOMEN AND PELVIS WITH CONTRAST TECHNIQUE: Multidetector CT imaging of the abdomen and pelvis was performed using the standard protocol following bolus administration of intravenous contrast. CONTRAST:  166mL ISOVUE-300 IOPAMIDOL (ISOVUE-300) INJECTION 61% COMPARISON:  CT abdomen and pelvis May 22, 2018 and CT chest May 24, 2018. FINDINGS: LOWER CHEST: LEFT lung base fibrosis tenting the esophagus. Distended esophagus with small air contrast level. Worsening LEFT lung base patchy airspace opacity. HEPATOBILIARY: Mild biliary dilatation LEFT lobe of the liver, likely postprocedural. Status post cholecystectomy.  No intrahepatic masses. PANCREAS: Nonacute.  Punctate calcification. SPLEEN: Normal. ADRENALS/URINARY TRACT: Kidneys are orthotopic, demonstrating symmetric enhancement. No obstructing nephrolithiasis, hydronephrosis or solid renal masses. Early excretion of contrast limits sensitivity for potential small nonobstructing nephrolithiasis. The unopacified ureters are normal in course and caliber. Delayed imaging through the kidneys demonstrates symmetric prompt contrast excretion within the proximal urinary collecting system. Urinary bladder is well distended containing contrast. Normal adrenal glands. STOMACH/BOWEL: Small bowel dilated to 3.9 cm with air-fluid levels  and mural edema most apparent within the pelvis. Focal inflammatory changes of small bowel in LEFT central pelvis (image 55). Multiple small-bowel transition points in the pelvis. The stomach, large bowel are normal in course and caliber without inflammatory changes. Severe sigmoid and to lesser extent descending colonic diverticulosis. VASCULAR/LYMPHATIC: Mildly patent infrarenal aorta. Moderate calcific atherosclerosis. No lymphadenopathy by CT size criteria. REPRODUCTIVE: Status post hysterectomy. OTHER: Small volume free fluid in the pelvis. No intraperitoneal free air or focal fluid collections. Mesenteric fat stranding mid pelvis. MUSCULOSKELETAL: Nonacute. Moderate to severe LEFT hip osteoarthrosis. Degenerative changes lumbar spine superimposed on congenital canal narrowing. IMPRESSION: 1. Low-grade small bowel obstruction with multiple transition points in the pelvis associated with inflammatory changes, this may be secondary to enteritis or adhesions. 2. LEFT lung base fibrosis with superimposed worsening airspace opacities, this could reflect post radiation pneumonitis or pneumonia. 3. Colonic diverticulosis without acute diverticulitis. Aortic Atherosclerosis (ICD10-I70.0). Electronically Signed   By: Elon Alas M.D.   On: 05/25/2018  05:40    ____________________________________________   PROCEDURES  Procedure(s) performed:   Procedures  None ____________________________________________   INITIAL IMPRESSION / ASSESSMENT AND PLAN / ED COURSE  Pertinent labs & imaging results that were available during my care of the patient were reviewed by me and considered in my medical decision making (see chart for details).  Patient with active lung cancer currently on chemo presents with continued diffuse abdominal discomfort with 2 episodes of vomiting today along with one episode of diarrhea.  She reports some associated shortness of breath.  She is on her baseline home O2.  No significant increased work of breathing.  Doubt ACS presentation.  Plan for DuoNeb and reassessment.  In terms of the patient's abdominal pain she has diffuse mild tenderness with no focal tenderness or guarding.  This seems consistent with her exam from 2 days ago.  I do not feel that repeat CT imaging of the abdomen and pelvis is warranted at this time.  Plan for repeat labs, Phenergan for nausea, and Bentyl for abdominal cramping/spasm pain.   08:00 PM Labs reviewed with significant increase in white blood cell count.  No fever here.  Patient received nebulizer treatment with only mild improvement with shortness of breath.  CT angios of the chest ordered given increased risk for PE given her active cancer.  This will also help to identify any occult pneumonia.  No indication for repeat CT of the abdomen at this time.   09:27 PM Spoke with Cardiology Dr. Aundra Dubin. Agrees with staying at [facility] and trending troponin. Suspect cardiac injury 2/2 non-ACS event. CTA of the chest shows no PE. Does have some possible pneumonitis. With elevated WBC count to 21 and patient being on chemo with non-specific constitutional complaints with cover for HCAP and admit for observation.   Discussed patient's case with Hospitalist, Dr. Olevia Bowens to request admission. Patient and  family (if present) updated with plan. Care transferred to Hospitalist service.  I reviewed all nursing notes, vitals, pertinent old records, EKGs, labs, imaging (as available).  ____________________________________________  FINAL CLINICAL IMPRESSION(S) / ED DIAGNOSES  Final diagnoses:  Abdominal pain, unspecified abdominal location  SOB (shortness of breath)  Pneumonitis     MEDICATIONS GIVEN DURING THIS VISIT:  Medications  enoxaparin (LOVENOX) injection 40 mg (40 mg Subcutaneous Given 05/25/18 0134)  acetaminophen (TYLENOL) tablet 650 mg (has no administration in time range)    Or  acetaminophen (TYLENOL) suppository 650 mg (has no administration in time range)  fentaNYL (SUBLIMAZE) injection  50 mcg (50 mcg Intravenous Given 05/25/18 0128)  prochlorperazine (COMPAZINE) injection 5 mg (5 mg Intravenous Given 05/25/18 0652)  insulin glargine (LANTUS) injection 15 Units (has no administration in time range)  ipratropium-albuterol (DUONEB) 0.5-2.5 (3) MG/3ML nebulizer solution 3 mL (has no administration in time range)  0.9 %  sodium chloride infusion ( Intravenous New Bag/Given 05/25/18 0746)  ceFEPIme (MAXIPIME) 2 g in sodium chloride 0.9 % 100 mL IVPB (has no administration in time range)  vancomycin (VANCOCIN) 1,250 mg in sodium chloride 0.9 % 250 mL IVPB (has no administration in time range)  atorvastatin (LIPITOR) tablet 40 mg (has no administration in time range)  losartan (COZAAR) tablet 50 mg (has no administration in time range)  metoprolol succinate (TOPROL-XL) 24 hr tablet 25 mg (has no administration in time range)  aspirin EC tablet 81 mg (has no administration in time range)  metoprolol tartrate (LOPRESSOR) injection 5 mg (has no administration in time range)  sodium chloride 0.9 % bolus 500 mL (0 mLs Intravenous Stopped 05/24/18 2031)  promethazine (PHENERGAN) injection 12.5 mg (12.5 mg Intravenous Given 05/24/18 1926)  dicyclomine (BENTYL) capsule 10 mg (10 mg Oral Given  05/24/18 1926)  ipratropium-albuterol (DUONEB) 0.5-2.5 (3) MG/3ML nebulizer solution 3 mL (3 mLs Nebulization Given 05/24/18 1947)  iopamidol (ISOVUE-370) 76 % injection 80 mL ( Intravenous Contrast Given 05/24/18 2009)  ceFEPIme (MAXIPIME) 2 g in sodium chloride 0.9 % 100 mL IVPB (0 g Intravenous Stopped 05/24/18 2235)  vancomycin (VANCOCIN) IVPB 1000 mg/200 mL premix (0 mg Intravenous Stopped 05/24/18 2341)  famotidine (PEPCID) IVPB 20 mg premix (0 mg Intravenous Stopped 05/25/18 0203)  prochlorperazine (COMPAZINE) injection 5 mg (5 mg Intravenous Given 05/25/18 0246)  iopamidol (ISOVUE-300) 61 % injection 100 mL (100 mLs Intravenous Contrast Given 05/25/18 0513)    Note:  This document was prepared using Dragon voice recognition software and may include unintentional dictation errors.  Nanda Quinton, MD Emergency Medicine    Sedalia Greeson, [facility], MD 05/25/18 [phone]

## 2018-05-24 NOTE — ED Notes (Signed)
Ordered to start antibiotics by EDP Edilberto Roosevelt

## 2018-05-24 NOTE — ED Notes (Signed)
THIRD Lab tech attempted blood cultures Tech able to to get one set of cultures, EDP Anberlyn Feimster notified.

## 2018-05-24 NOTE — ED Notes (Signed)
Pt attempted a urine sample but unable to get one at this time. Pt given a swab for her mouth at this time.

## 2018-05-24 NOTE — ED Notes (Signed)
Lab at bedside attempting to draw blood cultures

## 2018-05-24 NOTE — ED Notes (Signed)
CRITICAL VALUE ALERT  Critical Value:  Troponin 0.28  Date & Time Notied:  05/24/18 2029  Provider Notified: EDP Aleczander Fandino   Orders Received/Actions taken: No orders at this time

## 2018-05-24 NOTE — Progress Notes (Signed)
Pharmacy Note:  Initial antibiotic(s) regimen of Vancomycin and Cefepime ordered by EDP to treat PNA.  Estimated Creatinine Clearance: 35.9 mL/min (A) (by C-G formula based on SCr of 1.16 mg/dL (H)).   Allergies  Allergen Reactions  . Lisinopril Cough    Vitals:   05/24/18 1930 05/24/18 1949  BP: 110/77   Pulse: 100   Resp: 18   Temp:    SpO2: 97% 100%    Anti-infectives (From admission, onward)   Start     Dose/Rate Route Frequency Ordered Stop   05/24/18 2100  ceFEPIme (MAXIPIME) 2 g in sodium chloride 0.9 % 100 mL IVPB     2 g 200 mL/hr over 30 Minutes Intravenous  Once 05/24/18 2049     05/24/18 2100  vancomycin (VANCOCIN) IVPB 1000 mg/200 mL premix     1,000 mg 200 mL/hr over 60 Minutes Intravenous  Once 05/24/18 2049       Antimicrobials this admission:  Vanc 6/17 >>  Cefepime 6/17 >>   Dose adjustments this admission:  n/a   Microbiology results:   BCx:   UCx:    Sputum:    MRSA PCR:    Plan: Initial dose(s) of Vancomycin and Cefepime  X 1 ordered. F/U admission orders for further dosing if therapy continued.  Pricilla Larsson, [facility] 05/24/2018 8:53 PM

## 2018-05-25 ENCOUNTER — Inpatient Hospital Stay (HOSPITAL_COMMUNITY): Payer: Medicare Other

## 2018-05-25 ENCOUNTER — Encounter (HOSPITAL_COMMUNITY): Payer: Self-pay | Admitting: Family Medicine

## 2018-05-25 ENCOUNTER — Other Ambulatory Visit (HOSPITAL_COMMUNITY): Payer: Medicare Other

## 2018-05-25 DIAGNOSIS — R140 Abdominal distension (gaseous): Secondary | ICD-10-CM

## 2018-05-25 DIAGNOSIS — K56609 Unspecified intestinal obstruction, unspecified as to partial versus complete obstruction: Secondary | ICD-10-CM

## 2018-05-25 LAB — BASIC METABOLIC PANEL
Anion gap: 10 (ref 5–15)
BUN: 11 mg/dL (ref 6–20)
CO2: 24 mmol/L (ref 22–32)
Calcium: 9 mg/dL (ref 8.9–10.3)
Chloride: 96 mmol/L — ABNORMAL LOW (ref 101–111)
Creatinine, Ser: 1.1 mg/dL — ABNORMAL HIGH (ref 0.44–1.00)
GFR calc Af Amer: 53 mL/min — ABNORMAL LOW (ref >60)
GFR calc non Af Amer: 45 mL/min — ABNORMAL LOW (ref >60)
Glucose, Bld: 156 mg/dL — ABNORMAL HIGH (ref 65–99)
Potassium: 4.2 mmol/L (ref 3.5–5.1)
Sodium: 130 mmol/L — ABNORMAL LOW (ref 135–145)

## 2018-05-25 LAB — CBC WITH DIFFERENTIAL/PLATELET
Basophils Absolute: 0 10*3/uL (ref 0.0–0.1)
Basophils Relative: 0 %
Eosinophils Absolute: 0.1 10*3/uL (ref 0.0–0.7)
Eosinophils Relative: 0 %
HCT: 39 % (ref 36.0–46.0)
Hemoglobin: 12.3 g/dL (ref 12.0–15.0)
Lymphocytes Relative: 6 %
Lymphs Abs: 1.2 10*3/uL (ref 0.7–4.0)
MCH: 27 pg (ref 26.0–34.0)
MCHC: 31.5 g/dL (ref 30.0–36.0)
MCV: 85.7 fL (ref 78.0–100.0)
Monocytes Absolute: 2 10*3/uL — ABNORMAL HIGH (ref 0.1–1.0)
Monocytes Relative: 10 %
Neutro Abs: 16.9 10*3/uL — ABNORMAL HIGH (ref 1.7–7.7)
Neutrophils Relative %: 84 %
Platelets: 188 10*3/uL (ref 150–400)
RBC: 4.55 MIL/uL (ref 3.87–5.11)
RDW: 15.3 % (ref 11.5–15.5)
WBC: 20.1 10*3/uL — ABNORMAL HIGH (ref 4.0–10.5)

## 2018-05-25 LAB — GLUCOSE, CAPILLARY: Glucose-Capillary: 160 mg/dL — ABNORMAL HIGH (ref 65–99)

## 2018-05-25 LAB — MRSA PCR SCREENING: MRSA by PCR: NEGATIVE

## 2018-05-25 LAB — TROPONIN I
Troponin I: 0.28 ng/mL (ref <0.03)
Troponin I: 0.28 ng/mL (ref <0.03)

## 2018-05-25 MED ORDER — METOPROLOL TARTRATE 5 MG/5ML IV SOLN
5.0000 mg | Freq: Four times a day (QID) | INTRAVENOUS | Status: DC | PRN
  Administered 2018-05-25 – 2018-05-30 (×2): 5 mg via INTRAVENOUS
  Filled 2018-05-25 (×2): qty 5

## 2018-05-25 MED ORDER — ENOXAPARIN SODIUM 40 MG/0.4ML ~~LOC~~ SOLN
40.0000 mg | SUBCUTANEOUS | Status: DC
  Administered 2018-05-25 – 2018-06-02 (×9): 40 mg via SUBCUTANEOUS
  Filled 2018-05-25 (×9): qty 0.4

## 2018-05-25 MED ORDER — FUROSEMIDE 40 MG PO TABS: 40.0000 mg | ORAL_TABLET | Freq: Every day | ORAL | Status: DC

## 2018-05-25 MED ORDER — METOPROLOL SUCCINATE ER 25 MG PO TB24
25.0000 mg | ORAL_TABLET | Freq: Every day | ORAL | Status: DC
  Administered 2018-05-25 – 2018-06-04 (×11): 25 mg via ORAL
  Filled 2018-05-25 (×11): qty 1

## 2018-05-25 MED ORDER — SODIUM CHLORIDE 0.9 % IV SOLN
2.0000 g | INTRAVENOUS | Status: DC
  Filled 2018-05-25: qty 2

## 2018-05-25 MED ORDER — ALBUTEROL SULFATE (2.5 MG/3ML) 0.083% IN NEBU
INHALATION_SOLUTION | RESPIRATORY_TRACT | Status: AC
  Administered 2018-05-25: 11:00:00
  Filled 2018-05-25: qty 3

## 2018-05-25 MED ORDER — ATORVASTATIN CALCIUM 40 MG PO TABS
40.0000 mg | ORAL_TABLET | Freq: Every day | ORAL | Status: DC
  Administered 2018-05-25 – 2018-06-03 (×9): 40 mg via ORAL
  Filled 2018-05-25 (×9): qty 1

## 2018-05-25 MED ORDER — SODIUM CHLORIDE 0.9 % IV SOLN
INTRAVENOUS | Status: DC
  Administered 2018-05-25: 08:00:00 via INTRAVENOUS

## 2018-05-25 MED ORDER — IOPAMIDOL (ISOVUE-300) INJECTION 61%
100.0000 mL | Freq: Once | INTRAVENOUS | Status: AC | PRN
  Administered 2018-05-25: 100 mL via INTRAVENOUS

## 2018-05-25 MED ORDER — PROCHLORPERAZINE EDISYLATE 10 MG/2ML IJ SOLN
5.0000 mg | INTRAMUSCULAR | Status: DC | PRN
  Administered 2018-05-25 (×2): 5 mg via INTRAVENOUS
  Filled 2018-05-25 (×2): qty 2

## 2018-05-25 MED ORDER — ACETAMINOPHEN 325 MG PO TABS
650.0000 mg | ORAL_TABLET | Freq: Four times a day (QID) | ORAL | Status: DC | PRN
  Administered 2018-05-25: 650 mg via ORAL
  Filled 2018-05-25: qty 2

## 2018-05-25 MED ORDER — FUROSEMIDE 10 MG/ML IJ SOLN
40.0000 mg | Freq: Once | INTRAMUSCULAR | Status: AC
  Administered 2018-05-25: 40 mg via INTRAVENOUS
  Filled 2018-05-25: qty 4

## 2018-05-25 MED ORDER — INSULIN GLARGINE 100 UNIT/ML ~~LOC~~ SOLN
15.0000 [IU] | Freq: Every day | SUBCUTANEOUS | Status: DC
  Administered 2018-05-25 – 2018-05-30 (×6): 15 [IU] via SUBCUTANEOUS
  Filled 2018-05-25 (×7): qty 0.15

## 2018-05-25 MED ORDER — SODIUM CHLORIDE 0.9 % IV SOLN: 1.0000 g | Freq: Three times a day (TID) | INTRAVENOUS | Status: DC

## 2018-05-25 MED ORDER — ACETAMINOPHEN 650 MG RE SUPP: 650.0000 mg | Freq: Four times a day (QID) | RECTAL | Status: DC | PRN

## 2018-05-25 MED ORDER — VANCOMYCIN HCL 10 G IV SOLR
1250.0000 mg | INTRAVENOUS | Status: DC
  Filled 2018-05-25: qty 1250

## 2018-05-25 MED ORDER — FENTANYL CITRATE (PF) 100 MCG/2ML IJ SOLN
50.0000 ug | INTRAMUSCULAR | Status: DC | PRN
  Administered 2018-05-25 – 2018-05-29 (×20): 50 ug via INTRAVENOUS
  Filled 2018-05-25 (×21): qty 2

## 2018-05-25 MED ORDER — PROCHLORPERAZINE EDISYLATE 10 MG/2ML IJ SOLN
5.0000 mg | Freq: Once | INTRAMUSCULAR | Status: AC
  Administered 2018-05-25: 5 mg via INTRAVENOUS
  Filled 2018-05-25: qty 2

## 2018-05-25 MED ORDER — LOSARTAN POTASSIUM 50 MG PO TABS
50.0000 mg | ORAL_TABLET | Freq: Every day | ORAL | Status: DC
  Administered 2018-05-25 – 2018-06-04 (×11): 50 mg via ORAL
  Filled 2018-05-25 (×11): qty 1

## 2018-05-25 MED ORDER — ASPIRIN EC 81 MG PO TBEC
81.0000 mg | DELAYED_RELEASE_TABLET | Freq: Every day | ORAL | Status: DC
  Administered 2018-05-25 – 2018-06-04 (×11): 81 mg via ORAL
  Filled 2018-05-25 (×11): qty 1

## 2018-05-25 MED ORDER — IPRATROPIUM-ALBUTEROL 0.5-2.5 (3) MG/3ML IN SOLN
3.0000 mL | Freq: Four times a day (QID) | RESPIRATORY_TRACT | Status: DC
  Administered 2018-05-25 – 2018-05-30 (×21): 3 mL via RESPIRATORY_TRACT
  Filled 2018-05-25 (×21): qty 3

## 2018-05-25 MED ORDER — ONDANSETRON HCL 4 MG/2ML IJ SOLN
4.0000 mg | Freq: Four times a day (QID) | INTRAMUSCULAR | Status: DC | PRN
  Administered 2018-05-25: 4 mg via INTRAVENOUS
  Filled 2018-05-25: qty 2

## 2018-05-25 MED ORDER — CEFEPIME HCL 1 G IJ SOLR
1.0000 g | Freq: Three times a day (TID) | INTRAMUSCULAR | Status: DC
  Administered 2018-05-25: 1 g via INTRAVENOUS
  Filled 2018-05-25 (×3): qty 1

## 2018-05-25 MED ORDER — FAMOTIDINE IN NACL 20-0.9 MG/50ML-% IV SOLN
20.0000 mg | Freq: Once | INTRAVENOUS | Status: AC
  Administered 2018-05-25: 20 mg via INTRAVENOUS
  Filled 2018-05-25: qty 50

## 2018-05-25 MED ORDER — SODIUM CHLORIDE 0.9 % IV SOLN
3.0000 g | Freq: Three times a day (TID) | INTRAVENOUS | Status: AC
  Administered 2018-05-25 – 2018-06-02 (×26): 3 g via INTRAVENOUS
  Filled 2018-05-25 (×28): qty 3

## 2018-05-25 NOTE — Progress Notes (Signed)
05/24/2018  6:01 PM  05/25/2018 8:48 AM  Casy Rich Reining was seen and examined.  The H&P by the admitting provider, orders, imaging was reviewed.  Please see new orders.  Will continue to follow.  The patient has complaints of abdominal distension, abdominal pain, no flatus, will ask for gen surgery eval regarding bowel obstruction and ask surgery if NG tube is needed.  Keep NPO for now.  See orders  Vitals:   05/25/18 0700 05/25/18 0839  BP: (!) 165/83   Pulse: (!) 113   Resp: 19   Temp:    SpO2: 98% 100%    Results for orders placed or performed during the hospital encounter of 05/24/18  Culture, blood (routine x 2)  Result Value Ref Range   Specimen Description BLOOD LEFT HAND    Special Requests      BOTTLES DRAWN AEROBIC ONLY Blood Culture adequate volume   Culture      NO GROWTH < 12 HOURS Performed at [facility], [address] [place], [place] [address]    Report Status PENDING   Culture, blood (routine x 2)  Result Value Ref Range   Specimen Description BLOOD LEFT HAND    Special Requests      BOTTLES DRAWN AEROBIC ONLY Blood Culture adequate volume   Culture      NO GROWTH < 12 HOURS Performed at [facility], [address], [place], [place] [address]    Report Status PENDING   MRSA PCR Screening  Result Value Ref Range   MRSA by PCR NEGATIVE NEGATIVE  Lipase, blood  Result Value Ref Range   Lipase 24 11 - 51 U/L  Comprehensive metabolic panel  Result Value Ref Range   Sodium 132 (L) 135 - 145 mmol/L   Potassium 4.5 3.5 - 5.1 mmol/L   Chloride 93 (L) 101 - 111 mmol/L   CO2 29 22 - 32 mmol/L   Glucose, Bld 158 (H) 65 - 99 mg/dL   BUN 12 6 - 20 mg/dL   Creatinine, Ser 1.16 (H) 0.44 - 1.00 mg/dL   Calcium 9.3 8.9 - 10.3 mg/dL   Total Protein 7.2 6.5 - 8.1 g/dL   Albumin 3.5 3.5 - 5.0 g/dL   AST 14 (L) 15 - 41 U/L   ALT 14 14 - 54 U/L   Alkaline Phosphatase 66 38 - 126 U/L   Total Bilirubin 1.0 0.3 - 1.2 mg/dL   GFR calc non Af Amer 43 (L) >60 mL/min   GFR calc Af Amer 49 (L) >60 mL/min   Anion gap 10 5 - 15  CBC  Result Value Ref Range   WBC 21.6 (H) 4.0 - 10.5 K/uL   RBC 4.53 3.87 - 5.11 MIL/uL   Hemoglobin 12.1 12.0 - 15.0 g/dL   HCT 38.8 36.0 - 46.0 %   MCV 85.7 78.0 - 100.0 fL   MCH 26.7 26.0 - 34.0 pg   MCHC 31.2 30.0 - 36.0 g/dL   RDW 15.2 11.5 - 15.5 %   Platelets 178 150 - 400 K/uL  Urinalysis, Routine w reflex microscopic  Result Value Ref Range   Color, Urine YELLOW YELLOW   APPearance CLOUDY (A) CLEAR   Specific Gravity, Urine 1.038 (H) 1.005 - 1.030   pH 6.0 5.0 - 8.0   Glucose, UA NEGATIVE NEGATIVE mg/dL   Hgb urine dipstick SMALL (A) NEGATIVE   Bilirubin Urine NEGATIVE NEGATIVE   Ketones, ur NEGATIVE NEGATIVE mg/dL   Protein, ur NEGATIVE NEGATIVE mg/dL  Nitrite NEGATIVE NEGATIVE   Leukocytes, UA LARGE (A) NEGATIVE   RBC / HPF 0-5 0 - 5 RBC/hpf   WBC, UA >50 (H) 0 - 5 WBC/hpf   Bacteria, UA RARE (A) NONE SEEN   Squamous Epithelial / LPF 0-5 0 - 5   WBC Clumps PRESENT    Non Squamous Epithelial 0-5 (A) NONE SEEN  Troponin I  Result Value Ref Range   Troponin I 0.28 (HH) <0.03 ng/mL  Troponin I  Result Value Ref Range   Troponin I 0.25 (HH) <0.03 ng/mL  Troponin I  Result Value Ref Range   Troponin I 0.28 (HH) <0.03 ng/mL  CBC WITH DIFFERENTIAL  Result Value Ref Range   WBC 20.1 (H) 4.0 - 10.5 K/uL   RBC 4.55 3.87 - 5.11 MIL/uL   Hemoglobin 12.3 12.0 - 15.0 g/dL   HCT 39.0 36.0 - 46.0 %   MCV 85.7 78.0 - 100.0 fL   MCH 27.0 26.0 - 34.0 pg   MCHC 31.5 30.0 - 36.0 g/dL   RDW 15.3 11.5 - 15.5 %   Platelets 188 150 - 400 K/uL   Neutrophils Relative % 84 %   Neutro Abs 16.9 (H) 1.7 - 7.7 K/uL   Lymphocytes Relative 6 %   Lymphs Abs 1.2 0.7 - 4.0 K/uL   Monocytes Relative 10 %   Monocytes Absolute 2.0 (H) 0.1 - 1.0 K/uL   Eosinophils Relative 0 %   Eosinophils Absolute 0.1 0.0 - 0.7 K/uL   Basophils Relative 0 %   Basophils Absolute 0.0 0.0 - 0.1 K/uL  Basic metabolic panel  Result Value Ref  Range   Sodium 130 (L) 135 - 145 mmol/L   Potassium 4.2 3.5 - 5.1 mmol/L   Chloride 96 (L) 101 - 111 mmol/L   CO2 24 22 - 32 mmol/L   Glucose, Bld 156 (H) 65 - 99 mg/dL   BUN 11 6 - 20 mg/dL   Creatinine, Ser 1.10 (H) 0.44 - 1.00 mg/dL   Calcium 9.0 8.9 - 10.3 mg/dL   GFR calc non Af Amer 45 (L) >60 mL/min   GFR calc Af Amer 53 (L) >60 mL/min   Anion gap 10 5 - 15     C. Wynetta Emery, MD [facility]

## 2018-05-25 NOTE — Consult Note (Addendum)
Cardiology Consult    Patient ID: Angela Lawson; [id number]; Oct 04, 1934   Admit date: 05/24/2018 Date of Consult: 05/25/2018  Primary Care Provider: Rosita Fire, MD Primary Cardiologist: Previously followed by Dr. Irish Lack in 09/2016   Patient Profile    Angela Lawson is a 84 y.o. female with past medical history of Takotsubo cardiomyopathy (EF 35-40% in 2016 with cath showing nonobstructive CAD), HTN, HLD, Type 2 DM, COPD (on 3L ), Parkinson's disease, and current Stage 4 Adenocarcinoma of the lung who is being seen today for the evaluation of elevated troponin values at the request of Dr. Laverta Baltimore.   History of Present Illness    Angela Lawson was last examined by Dr. Irish Lack in 09/2016 and reported overall doing well from a cardiac perspective at that time, denying any recent chest pain or dyspnea on exertion. Was using Lasix as needed for weight gain or worsening edema. She was continued on ARB, BB, and statin therapy at that time and informed to follow-up with Cardiology as needed.   She presented to [facility] ED on 05/24/2018 for evaluation of worsening dyspnea and abdominal pain for the past several days. Had actually been evaluated in the ED two days prior for similar symptoms and CT Imaging of the abdomen showed no acute findings. In talking with the patient, she reports worsening dyspnea on exertion for the past 3-4 days leading up to admission. Says weight had overall been stable on her home scales and she denies any associated orthopnea, PND, lower extremity edema, palpitations, or chest discomfort. Had experienced progressive abdominal discomfort with associated nausea and vomiting.    Initial labs showed WBC 21.6, Hgb 12.1, platelets 178, Na+ 132, K+ 4.5, and creatinine 1.16. Initial troponin 0.28 with repeat values of 0.25 and 0.28. CXR showed no new cardiopulmonary abnormalities. CTA was negative for PE but did show increasing ground-glass opacification and patchy areas of  consolidation in the dependent left lung may represent progressive posttreatment changes or superimposed pneumonitis. Repeat CT Abdomen was also obtained and showed a low-grade SBO with multiple transition points associated with inflammatory changes which may be secondary to enteritis or adhesions. EKG showed NSR, HR 96, with no acute ST changes when compared to prior tracings.   She has been admitted for HCAP and started on Cefepime and Vancomycin. Blood cultures are pending.  Past Medical History:  Diagnosis Date  . Adenocarcinoma of lung ([place])    Left lung 2009, resected  . Anginal pain ([place])   . Arthritis   . Back pain   . CHF (congestive heart failure) ([place])   . COPD (chronic obstructive pulmonary disease) ([place])   . Diverticulitis   . Dyslipidemia   . Essential hypertension   . H/O ventral hernia   . Non-obstructive CAD    a. 04/2015 NSTEMI/Cath: LAD 10p, LCX 16m, RCA 5m, 20d, EF 35-40 w/ apical ballooning.  . On home O2    3L N/C  . Osteoporosis   . Osteoporosis 11/04/2015   Managed by Dr. Legrand Rams   . Parkinson's disease ([place])   . Shortness of breath   . Takotsubo cardiomyopathy    a. 04/2015 Echo: EF 45-50%, mid-dist anterior/apical/inferoapical HK w/ hyperdynamic base. Gr 1 DD, mild AI, mild-mod MR, triv TR, PASP 43mmHg;  b. 04/2015 LV gram: Ef 35-40% w/ apical ballooning.  . Type II diabetes mellitus ([place])   . Ventricular bigeminy    a. 04/2015 in setting of NSTEMI/Takotsubo.    Past Surgical History:  Procedure Laterality Date  . ABDOMINAL HYSTERECTOMY    . CARDIAC CATHETERIZATION N/A 04/17/2015   Procedure: Left Heart Cath and Coronary Angiography;  Surgeon: Troy Sine, MD;  Location: [place] [facility];  Service: Cardiovascular;  Laterality: N/A;  . CHOLECYSTECTOMY    . COLONOSCOPY N/A 09/18/2014   Procedure: COLONOSCOPY;  Surgeon: Danie Binder, MD;  Location: [facility];  Service: Endoscopy;  Laterality: N/A;  8:30 AM - moved to 10:30 Rosendo Gros to notify pt  .  ECTOPIC PREGNANCY SURGERY    . INCISIONAL HERNIA REPAIR N/A 08/26/2013   Procedure: HERNIA REPAIR INCISIONAL WITH MESH;  Surgeon: Jamesetta So, MD;  Location: [facility] ORS;  Service: General;  Laterality: N/A;  . IR FLUORO GUIDE PORT INSERTION RIGHT  04/30/2017  . IR US GUIDE VASC ACCESS RIGHT  04/30/2017  . LUNG CANCER SURGERY    . VIDEO BRONCHOSCOPY WITH ENDOBRONCHIAL NAVIGATION N/A 04/23/2017   Procedure: VIDEO BRONCHOSCOPY WITH ENDOBRONCHIAL NAVIGATION;  Surgeon: Melrose Nakayama, MD;  Location: [place];  Service: Thoracic;  Laterality: N/A;  . VIDEO BRONCHOSCOPY WITH ENDOBRONCHIAL ULTRASOUND N/A 04/23/2017   Procedure: VIDEO BRONCHOSCOPY WITH ENDOBRONCHIAL ULTRASOUND;  Surgeon: Melrose Nakayama, MD;  Location: [facility];  Service: Thoracic;  Laterality: N/A;     Home Medications:  Prior to Admission medications   Medication Sig Start Date End Date Taking? Authorizing Provider  albuterol (PROAIR HFA) 108 (90 BASE) MCG/ACT inhaler Inhale 2 puffs into the lungs every 4 (four) hours as needed for wheezing or shortness of breath.    [provider]  alendronate (FOSAMAX) 70 MG tablet Take 70 mg by mouth every Friday.     [provider]  aspirin EC 81 MG tablet Take 81 mg by mouth daily.    [provider]  Atezolizumab (TECENTRIQ IV) Inject into the vein. Every 3 weeks    [provider]  atorvastatin (LIPITOR) 40 MG tablet Take 40 mg by mouth at bedtime.     [provider]  B-D ULTRAFINE III SHORT PEN 31G X 8 MM MISC See admin instructions. 12/27/17   [provider]  benzonatate (TESSALON) 200 MG capsule TAKE 1 CAPSULE BY MOUTH THREE TIMES A DAY AS NEEDED 04/23/18   [provider]  buPROPion (WELLBUTRIN SR) 150 MG 12 hr tablet Take 150 mg by mouth 2 (two) times daily.     [provider]  carbidopa-levodopa (SINEMET IR) 25-100 MG tablet Take 1 tablet by mouth 2 (two) times daily.    [provider]  cholecalciferol  (VITAMIN D) 1000 units tablet Take 1,000 Units by mouth daily.    [provider]  clotrimazole-betamethasone (LOTRISONE) cream Apply 1 application topically 2 (two) times daily. 09/10/17   Holley Bouche, NP  furosemide (LASIX) 40 MG tablet Take 1 tablet (40 mg total) by mouth daily. Pt is able to take one additional tablet daily for weight increases of 3lbs in a day or 5lbs in a week. Resume on 10/20 09/23/17   Kathie Dike, MD  gabapentin (NEURONTIN) 300 MG capsule Take 600 mg by mouth at bedtime.     [provider]  Garlic 696 MG TABS Take 300 mg by mouth daily.    [provider]  ipratropium-albuterol (DUONEB) 0.5-2.5 (3) MG/3ML SOLN Inhale 3 mLs into the lungs every 6 (six) hours as needed (for wheezing/shortness of breath).     [provider]  KLOR-CON 10 10 MEQ tablet TAKE 1 TABLET (10 MEQ  TOTAL) BY MOUTH DAILY. *FURTHER REFILLS NEED TO BE AUTHORIZED BY PCP* Patient taking differently: TAKE 1 TABLET (10 MEQ TOTAL) BY MOUTH DAILY. 08/11/17   Lorretta Harp, MD  LANTUS SOLOSTAR 100 UNIT/ML Solostar Pen Inject 15 Units into the skin at bedtime.     [provider]  lidocaine-prilocaine (EMLA) cream Apply to affected area once Patient taking differently: Apply 1 application topically daily as needed.  05/19/17   Brunetta Genera, MD  linagliptin (TRADJENTA) 5 MG TABS tablet Take 5 mg by mouth daily.    [provider]  loratadine (CLARITIN) 10 MG tablet Take 10 mg by mouth daily.    [provider]  losartan (COZAAR) 50 MG tablet Take 50 mg by mouth daily. 02/22/18   [provider]  meclizine (ANTIVERT) 25 MG tablet Take 25 mg by mouth daily.     [provider]  metoprolol succinate (TOPROL-XL) 25 MG 24 hr tablet Take 1 tablet (25 mg total) by mouth daily. 06/26/15   Jettie Booze, MD  mometasone-formoterol [facility]) 200-5 MCG/ACT AERO Inhale 2 puffs into the lungs 2 (two) times daily. 02/08/18    Kathie Dike, MD  nitroGLYCERIN (NITROSTAT) 0.4 MG SL tablet Place 0.4 mg under the tongue every 5 (five) minutes as needed for chest pain.    [provider]  Omega-3 Fatty Acids (FISH OIL PO) Take 1 capsule by mouth daily.    [provider]  omeprazole (PRILOSEC) 20 MG capsule Take 20 mg by mouth 2 (two) times daily. 04/23/18   [provider]  ondansetron (ZOFRAN ODT) 4 MG disintegrating tablet Take 1 tablet (4 mg total) by mouth every 8 (eight) hours as needed for nausea or vomiting. 05/22/18   Francine Graven, DO  ondansetron (ZOFRAN) 8 MG tablet Take 1 tablet (8 mg total) by mouth 2 (two) times daily as needed (Nausea or vomiting). 05/19/17   Brunetta Genera, MD  OXYGEN Inhale 3 L into the lungs continuous.    [provider]  polyethylene glycol powder (GLYCOLAX/MIRALAX) powder Take 17 g by mouth daily as needed.    [provider]  prochlorperazine (COMPAZINE) 10 MG tablet Take 1 tablet (10 mg total) by mouth every 6 (six) hours as needed (Nausea or vomiting). 05/19/17   Brunetta Genera, MD  pyridoxine (B-6) 100 MG tablet Take 100 mg by mouth daily.     [provider]  rOPINIRole (REQUIP) 0.5 MG tablet Take 0.5 mg by mouth 2 (two) times daily.     [provider]  sodium chloride (OCEAN) 0.65 % SOLN nasal spray Place 1 spray into both nostrils every 3 (three) hours as needed for congestion.     [provider]  traMADol (ULTRAM) 50 MG tablet Take 50 mg by mouth every 12 (twelve) hours as needed for moderate pain.     [provider]  TRELEGY ELLIPTA 100-62.5-25 MCG/INH AEPB Inhale 1 puff into the lungs daily.  04/07/18   [provider]  vitamin B-12 (CYANOCOBALAMIN) 500 MCG tablet Take 500 mcg by mouth daily. 02/15/18   [provider]  zolpidem (AMBIEN) 5 MG tablet Take 5 mg by mouth at bedtime as needed for sleep.    [provider]    Inpatient Medications: Scheduled  Meds: . enoxaparin (LOVENOX) injection  40 mg Subcutaneous Q24H  . insulin glargine  15 Units Subcutaneous QHS  . ipratropium-albuterol  3 mL Nebulization Q6H   Continuous Infusions: . sodium chloride    .  ceFEPime (MAXIPIME) IV Stopped (05/25/18 0603)   PRN Meds: acetaminophen **OR** acetaminophen, fentaNYL (SUBLIMAZE) injection, prochlorperazine  Allergies:    Allergies  Allergen Reactions  . Lisinopril Cough    Social History:   Social History   Socioeconomic History  . Marital status: Widowed    Spouse name: Not on file  . Number of children: Not on file  . Years of education: Not on file  . Highest education level: Not on file  Occupational History  . Not on file  Social Needs  . Financial resource strain: Not on file  . Food insecurity:    Worry: Not on file    Inability: Not on file  . Transportation needs:    Medical: Not on file    Non-medical: Not on file  Tobacco Use  . Smoking status: Former Smoker    Years: 20.00    Types: Cigarettes    Last attempt to quit: 08/13/2006    Years since quitting: 11.7  . Smokeless tobacco: Never Used  Substance and Sexual Activity  . Alcohol use: No    Alcohol/week: 0.0 oz  . Drug use: No  . Sexual activity: Not Currently    Birth control/protection: Surgical  Lifestyle  . Physical activity:    Days per week: Not on file    Minutes per session: Not on file  . Stress: Not on file  Relationships  . Social connections:    Talks on phone: Not on file    Gets together: Not on file    Attends religious service: Not on file    Active member of club or organization: Not on file    Attends meetings of clubs or organizations: Not on file    Relationship status: Not on file  . Intimate partner violence:    Fear of current or ex partner: Not on file    Emotionally abused: Not on file    Physically abused: Not on file    Forced sexual activity: Not on file  Other Topics Concern  . Not on file  Social History Narrative    . Not on file     Family History:    Family History  Problem Relation Age of Onset  . Diabetes Mother   . Hypertension Mother   . Diabetes Father   . Asthma Unknown   . Cancer Unknown   . Heart attack Sister        X2  . Colon cancer Neg Hx       Review of Systems    General:  No chills, fever, night sweats or weight changes.  Cardiovascular:  No chest pain, edema, orthopnea, palpitations, paroxysmal nocturnal dyspnea. Positive for dyspnea on exertion.  Dermatological: No rash, lesions/masses Respiratory: No cough, dyspnea Urologic: No hematuria, dysuria Abdominal:   No diarrhea, bright red blood per rectum, melena, or hematemesis. Positive for nausea, vomiting, and abdominal pain.  Neurologic:  No visual changes, wkns, changes in mental status. All other systems reviewed and are otherwise negative except as noted above.  Physical Exam/Data    Vitals:   05/25/18 0400 05/25/18 0402 05/25/18 0500 05/25/18 0600  BP:  140/71 (!) 161/130 (!) 175/82  Pulse: (!) 108 (!) 107 (!) 112 (!) 106  Resp: 18 18 19 19   Temp:   99.4 F (37.4 C)   TempSrc:      SpO2: 94% 94% 92% 100%  Weight:   214 lb 4.6 oz (97.2 kg)   Height:  Intake/Output Summary (Last 24 hours) at 05/25/2018 0736 Last data filed at 05/25/2018 0603 Gross per 24 hour  Intake 730 ml  Output -  Net 730 ml   Filed Weights   05/24/18 1733 05/24/18 2312 05/25/18 0500  Weight: 154 lb (69.9 kg) 174 lb 9.7 oz (79.2 kg) 214 lb 4.6 oz (97.2 kg)   Body mass index is 36.78 kg/m.   General: Pleasant, African American female appearing in NAD Psych: Normal affect. Neuro: Alert and oriented X 3. Moves all extremities spontaneously. HEENT: Normal  Neck: Supple without bruits or JVD not elevated. Lungs:  Resp regular and unlabored, rhonchi throughout upper lung fields. On 2L [place].  Heart: Regular rhythm, tachycardiac rate, no s3, s4, or murmurs. Abdomen: Soft, non-tender, non-distended, BS + x 4.  Extremities: No  clubbing, cyanosis or lower extremity edema. DP/PT/Radials 2+ and equal bilaterally.   EKG:  The EKG was personally reviewed and demonstrates: NSR, HR 96, with no acute ST changes when compared to prior tracings.    Labs/Studies     Relevant CV Studies:  Echocardiogram: 02/06/2018 Study Conclusions  - Left ventricle: The cavity size was normal. Wall thickness was   normal. Systolic function was normal. The estimated ejection   fraction was in the range of 60% to 65%. Wall motion was normal;   there were no regional wall motion abnormalities. Doppler   parameters are consistent with abnormal left ventricular   relaxation (grade 1 diastolic dysfunction).  Impressions: - Normal LV systolic function; mild diastolic dysfunction.  Cardiac Catheterization: 04/2015  Prox LAD lesion, 10% stenosed.  Mid Cx lesion, 20% stenosed.  Mid RCA lesion, 30% stenosed.  Dist RCA lesion, 20% stenosed.   Moderately severe left ventricular dysfunction with severe hypo-to akinesis involving the mid distal anterolateral wall apex and distal inferior wall in a pattern suggestive of possible Takotsubu cardiomyopathy.  Global ejection fraction is 35-40%.  There is vigorous contractility of the basal walls.  Mild nonobstructive CAD with smooth 10% proximal LAD stenosis, 20% proximal circumflex stenosis, and 20-30% distal RCA stenoses.  RECOMMENDATION: Titration of medical therapy for the patient's cardiomyopathy.  The patient has evolved T-wave abnormalities anterolaterally.  Although the pattern is suggestive of Takotsubu cardiomyopathy, possible transient LAD vasospasm cannot be excluded.  Laboratory Data:  Chemistry Recent Labs  Lab 05/22/18 1555 05/24/18 1824 05/25/18 0404  NA 131* 132* 130*  K 4.0 4.5 4.2  CL 95* 93* 96*  CO2 28 29 24   GLUCOSE 109* 158* 156*  BUN 18 12 11   CREATININE 1.44* 1.16* 1.10*  CALCIUM 9.1 9.3 9.0  GFRNONAA 33* 43* 45*  GFRAA 38* 49* 53*  ANIONGAP 8 10  10     Recent Labs  Lab 05/20/18 1104 05/22/18 1555 05/24/18 1824  PROT 6.7 7.4 7.2  ALBUMIN 3.5 3.9 3.5  AST 13* 16 14*  ALT 3* <5* 14  ALKPHOS 53 59 66  BILITOT 0.6 0.7 1.0   Hematology Recent Labs  Lab 05/22/18 1606 05/24/18 1824 05/25/18 0404  WBC 11.2* 21.6* 20.1*  RBC 4.58 4.53 4.55  HGB 12.0 12.1 12.3  HCT 40.3 38.8 39.0  MCV 88.0 85.7 85.7  MCH 26.2 26.7 27.0  MCHC 29.8* 31.2 31.5  RDW 15.2 15.2 15.3  PLT 203 178 188   Cardiac Enzymes Recent Labs  Lab 05/24/18 1824 05/24/18 2158 05/25/18 0404  TROPONINI 0.28* 0.25* 0.28*   No results for input(s): TROPIPOC in the last 168 hours.  BNPNo results for input(s): BNP, PROBNP in the  last 168 hours.  DDimer No results for input(s): DDIMER in the last 168 hours.  Radiology/Studies:  Ct Abdomen Pelvis Wo Contrast  Result Date: 05/22/2018 CLINICAL DATA:  Abdominal pain and diarrhea. Per previous CT reports, history of lung cancer status post immunotherapy. EXAM: CT ABDOMEN AND PELVIS WITHOUT CONTRAST TECHNIQUE: Multidetector CT imaging of the abdomen and pelvis was performed following the standard protocol without IV contrast. COMPARISON:  None. FINDINGS: Lower chest: Again noted is the reticular ground-glass opacity at the LEFT lung base, incompletely imaged, similar to previous studies, described as post treatment changes on previous exams. RIGHT lung base is clear. Hepatobiliary: No focal liver abnormality is seen. Status post cholecystectomy. No biliary dilatation. Pancreas: Unremarkable. No pancreatic ductal dilatation or surrounding inflammatory changes. Spleen: Normal in size without focal abnormality. Adrenals/Urinary Tract: Adrenal glands appear normal. Kidneys are unremarkable without mass, stone or hydronephrosis. No ureteral or bladder calculi identified. Bladder appears normal. Stomach/Bowel: No dilated large or small bowel loops. Fairly extensive diverticulosis within the sigmoid and lower descending colon, but  no focal inflammatory change to suggest acute diverticulitis. No bowel wall thickening or evidence of bowel wall inflammation in the abdomen or pelvis. Stomach is unremarkable, partially decompressed. Vascular/Lymphatic: Aortic atherosclerosis. No enlarged abdominal or pelvic lymph nodes. Reproductive: Presumed hysterectomy.  No adnexal mass or free fluid. Other: No free fluid or abscess collection. No free intraperitoneal air. Musculoskeletal: Degenerative changes throughout the thoracolumbar spine, mild to moderate in degree. No acute or suspicious osseous finding. IMPRESSION: 1. No acute findings within the abdomen or pelvis. No evidence of metastasis. No bowel obstruction or evidence of bowel wall inflammation. No evidence of acute solid organ abnormality. No free fluid or abscess. 2. Colonic diverticulosis without evidence of acute diverticulitis. 3. Aortic atherosclerosis. Electronically Signed   By: Franki Cabot M.D.   On: 05/22/2018 17:53   Dg Chest 2 View  Result Date: 05/24/2018 CLINICAL DATA:  84 year old female with shortness of breath and abdominal pain for 4 days. Treated left lung cancer. EXAM: CHEST - 2 VIEW COMPARISON:  Chest radiograph 05/22/2018. Restaging CT chest abdomen and pelvis 03/15/2018, and earlier. FINDINGS: Upright AP and lateral views of the chest. Chronic architectural distortion and volume loss in the left lower lung. Stable lung volumes and mediastinal contours. Stable right chest porta cath. Calcified aortic atherosclerosis. No pneumothorax, pulmonary edema, pleural effusion or acute pulmonary opacity. Stable visualized osseous structures. Negative visible bowel gas pattern. IMPRESSION: Stable post treatment appearance of the chest. No new cardiopulmonary abnormality. Electronically Signed   By: Genevie Ann M.D.   On: 05/24/2018 19:23   Dg Chest 2 View  Result Date: 05/22/2018 CLINICAL DATA:  abd pain, N/V/D, pt having lung cancer treatments now EXAM: CHEST - 2 VIEW  COMPARISON:  Chest x-rays dated 02/05/2018 and 02/04/2018. FINDINGS: Stable volume loss in the LEFT hemithorax with basilar pleuroparenchymal scarring. No new lung findings. No pneumothorax seen. Heart size and mediastinal contours are stable. RIGHT chest wall Port-A-Cath is stable in position with tip at the level of the mid SVC. No acute or suspicious osseous finding. IMPRESSION: Stable chest x-ray. No active cardiopulmonary disease. No evidence of pneumonia or pulmonary edema. Electronically Signed   By: Franki Cabot M.D.   On: 05/22/2018 18:07   Ct Angio Chest Pe W And/or Wo Contrast  Result Date: 05/24/2018 CLINICAL DATA:  84 y/o F; shortness of breath and abdominal pain for 4 days. Worsening symptoms. History of lung cancer post resection. EXAM: CT ANGIOGRAPHY CHEST WITH  CONTRAST TECHNIQUE: Multidetector CT imaging of the chest was performed using the standard protocol during bolus administration of intravenous contrast. Multiplanar CT image reconstructions and MIPs were obtained to evaluate the vascular anatomy. CONTRAST:  80 cc Isovue 370 COMPARISON:  03/15/2018 CT chest. FINDINGS: Cardiovascular: Satisfactory opacification of the pulmonary arteries to the segmental level. No evidence of pulmonary embolism. Normal heart size. No pericardial effusion. Moderate calcific atherosclerosis of the coronary arteries and aorta. Mediastinum/Nodes: No enlarged mediastinal, hilar, or axillary lymph nodes. Thyroid gland, trachea, and esophagus demonstrate no significant findings. Small hiatal hernia. Lungs/Pleura: Stable postsurgical changes the left inferior hilum and lung base. Stable thick rimmed extrapleural cavity the left lower as ago esophageal recess prior surgery. Increasing ground-glass opacification with patchy areas of consolidation of the dependent left lung may represent progressive posttreatment changes or superimposed pneumonitis. No pleural effusion or interval pneumothorax. There are several small  3-4 mm nodules at the periphery of the lungs which are stable from prior study. Stable scarring in the right lung apex. Stable emphysema. Upper Abdomen: No acute abnormality. Musculoskeletal: Multiple chronic bilateral rib deformities from prior surgery and/or trauma. No acute fracture identified. Review of the MIP images confirms the above findings. IMPRESSION: 1. No pulmonary embolus identified. 2. Increasing ground-glass opacification and patchy areas of consolidation in the dependent left lung may represent progressive posttreatment changes or superimposed pneumonitis. No gross mass lesion to suggest recurrent disease. 3. Moderate aortic and coronary calcific atherosclerosis. 4. Small hiatal hernia. 5. Stable postsurgical changes in the left infrahilar region. Electronically Signed   By: Kristine Garbe M.D.   On: 05/24/2018 20:37   Ct Abdomen Pelvis W Contrast  Result Date: 05/25/2018 CLINICAL DATA:  Worsening abdominal pain, vomiting and distension for 2 days. History of lung cancer, COPD, hysterectomy, cholecystectomy, diverticulosis. EXAM: CT ABDOMEN AND PELVIS WITH CONTRAST TECHNIQUE: Multidetector CT imaging of the abdomen and pelvis was performed using the standard protocol following bolus administration of intravenous contrast. CONTRAST:  174mL ISOVUE-300 IOPAMIDOL (ISOVUE-300) INJECTION 61% COMPARISON:  CT abdomen and pelvis May 22, 2018 and CT chest May 24, 2018. FINDINGS: LOWER CHEST: LEFT lung base fibrosis tenting the esophagus. Distended esophagus with small air contrast level. Worsening LEFT lung base patchy airspace opacity. HEPATOBILIARY: Mild biliary dilatation LEFT lobe of the liver, likely postprocedural. Status post cholecystectomy. No intrahepatic masses. PANCREAS: Nonacute.  Punctate calcification. SPLEEN: Normal. ADRENALS/URINARY TRACT: Kidneys are orthotopic, demonstrating symmetric enhancement. No obstructing nephrolithiasis, hydronephrosis or solid renal masses. Early  excretion of contrast limits sensitivity for potential small nonobstructing nephrolithiasis. The unopacified ureters are normal in course and caliber. Delayed imaging through the kidneys demonstrates symmetric prompt contrast excretion within the proximal urinary collecting system. Urinary bladder is well distended containing contrast. Normal adrenal glands. STOMACH/BOWEL: Small bowel dilated to 3.9 cm with air-fluid levels and mural edema most apparent within the pelvis. Focal inflammatory changes of small bowel in LEFT central pelvis (image 55). Multiple small-bowel transition points in the pelvis. The stomach, large bowel are normal in course and caliber without inflammatory changes. Severe sigmoid and to lesser extent descending colonic diverticulosis. VASCULAR/LYMPHATIC: Mildly patent infrarenal aorta. Moderate calcific atherosclerosis. No lymphadenopathy by CT size criteria. REPRODUCTIVE: Status post hysterectomy. OTHER: Small volume free fluid in the pelvis. No intraperitoneal free air or focal fluid collections. Mesenteric fat stranding mid pelvis. MUSCULOSKELETAL: Nonacute. Moderate to severe LEFT hip osteoarthrosis. Degenerative changes lumbar spine superimposed on congenital canal narrowing. IMPRESSION: 1. Low-grade small bowel obstruction with multiple transition points in the pelvis associated with  inflammatory changes, this may be secondary to enteritis or adhesions. 2. LEFT lung base fibrosis with superimposed worsening airspace opacities, this could reflect post radiation pneumonitis or pneumonia. 3. Colonic diverticulosis without acute diverticulitis. Aortic Atherosclerosis (ICD10-I70.0). Electronically Signed   By: Elon Alas M.D.   On: 05/25/2018 05:40    Assessment & Plan    1. Elevated Troponin  - presented for evaluation of worsening dyspnea and abdominal pain, found to have HCAP and a low-grade SBO. At the time of admission, cyclic troponin values were obtained and found to be  flat at 0.28, 0.25, and 0.28. EKG shows no acute ischemic changes when compared to prior tracings.  - she denies any recent episodes of chest pain. Has baseline dyspnea on exertion in the setting of COPD and had noticed an acute worsening of her symptoms in the days leading to admission.  - she does have a history of Takotsubo cardiomyopathy with EF at 35-40% in 2016 and this having normalized since.  - with her recent catheterization in 2016 showing mild, nonobstructive CAD, would not plan for repeat ischemic evaluation at this time. Recent echo in 02/2018 showed a preserved EF with no regional WMA. Troponin elevation is most consistent with demand ischemia in the setting of her acute illness.  - continue with ASA, statin, and BB therapy (once cleared to resume PO medications).   2. History of Takotsubo Cardiomyopathy - EF 35-40% in 2016 with cath showing mild nonobstructive CAD. She has had subsequent normalization of her EF since.  - denies any recent orthopnea, PND, or lower extremity edema. Appears euvolemic on examination today.  - would restart Toprol-XL and Losartan once able to resume PO medications. Will hold Lasix as she is currently NPO.   3. HCAP - CTA showed increasing ground-glass opacification and patchy areas of consolidation in the dependent left lung may represent progressive posttreatment changes or superimposed pneumonitis.  - has been started on Cefepime and Vancomycin.  - per admitting team.   4. Abdominal Pain - repeat CT Abdomen showed a low-grade SBO with multiple transition points associated with inflammatory changes which may be secondary to enteritis or adhesions. - General Surgery consult has been requested by the admitting team.   5. HTN - BP elevated to 165/83 on most recent check.  - plan to resume PTA Toprol-XL and Losartan once able to take PO medications. Will place order for PRN IV Lopressor in the interim.   6. Stage 4 Adenocarcinoma of the Lung -  followed by Dr. Delton Coombes as an outpatient.    For questions or updates, please contact [place] Please consult [email or web address] for contact info under Cardiology/STEMI.  Signed, Erma Heritage, PA-C 05/25/2018, 7:36 AM Pager: [phone]  Patient examined chart reviewed Unfortunate female with stage 4 lung cancer admitted with partial SBO and pneumonitis Mild troponin elevation .25-.28 with no chest pain or acute ST changes Non obstructive CAD cath in 2016 with recovery of LV function and takatsubo DCM at that time. Exam with elderly black female Diffuse course crackles in lungs no murmur palpable distal pusles and positive BS with no tympany. Would recheck echo make sure no change in LV function Ok to have surgery for bowel obstruction if needed.   Jenkins Rouge

## 2018-05-25 NOTE — Consult Note (Signed)
[facility] Consult  Reason for Consult: SBO? Enteritis  Referring Physician:  Dr. Wynetta Emery   Chief Complaint    Abdominal Pain; Shortness of Breath      Angela Lawson is a 84 y.o. female.  HPI: Angela Lawson is an 84 yo with h/o lung cancer s/p resection, CAD, NSTEMI 2016, Takotsubo cardiomyopathy, CHF, cOPD, HTN, who had progressive worsening SOB and abdominal pain with nausea/vomiting over the last few days. She had a BM 2 days prior to her admission.  She had no fevers reported, and no CP reported.  She told me she had a colonoscopy in the past that was normal.  She has had multiple abdominal surgeries, and her CT c/a/p demonstrated concern for pneumonia in additional to several areas of bowel that were decompressed / thickened and dilated loops of bowel.  She has since her admission had multiple BMs per the patient and the RN. I was consulted for possible SBO.   Past Medical History:  Diagnosis Date  . Adenocarcinoma of lung ([place])    Left lung 2009, resected  . Anginal pain ([place])   . Arthritis   . Back pain   . CHF (congestive heart failure) ([place])   . COPD (chronic obstructive pulmonary disease) ([place])   . Diverticulitis   . Dyslipidemia   . Essential hypertension   . H/O ventral hernia   . Non-obstructive CAD    a. 04/2015 NSTEMI/Cath: LAD 10p, LCX 29m RCA 337m20d, EF 35-40 w/ apical ballooning.  . On home O2    3L N/C  . Osteoporosis   . Osteoporosis 11/04/2015   Managed by Dr. FaLegrand Rams . Parkinson's disease ([place]  . Shortness of breath   . Takotsubo cardiomyopathy    a. 04/2015 Echo: EF 45-50%, mid-dist anterior/apical/inferoapical HK w/ hyperdynamic base. Gr 1 DD, mild AI, mild-mod MR, triv TR, PASP 4842m;  b. 04/2015 LV gram: Ef 35-40% w/ apical ballooning.  . Type II diabetes mellitus ([facility] . Ventricular bigeminy    a. 04/2015 in setting of NSTEMI/Takotsubo.    Past Surgical History:  Procedure Laterality Date  . ABDOMINAL HYSTERECTOMY    .  CARDIAC CATHETERIZATION N/A 04/17/2015   Procedure: Left Heart Cath and Coronary Angiography;  Surgeon: ThoTroy SineD;  Location: [facility] [place] [facility];  Service: Cardiovascular;  Laterality: N/A;  . CHOLECYSTECTOMY    . COLONOSCOPY N/A 09/18/2014   Procedure: COLONOSCOPY;  Surgeon: SanDanie BinderD;  Location: [facility] SU[facility];  Service: Endoscopy;  Laterality: N/A;  8:30 AM - moved to 10:30 - CRosendo Gros notify pt  . ECTOPIC PREGNANCY SURGERY    . INCISIONAL HERNIA REPAIR N/A 08/26/2013   Procedure: HERNIA REPAIR INCISIONAL WITH MESH;  Surgeon: MarJamesetta SoD;  Location: [facility] ORS;  Service: General;  Laterality: N/A;  . IR FLUORO GUIDE PORT INSERTION RIGHT  04/30/2017  . IR US [place] VASC ACCESS RIGHT  04/30/2017  . LUNG CANCER SURGERY    . VIDEO BRONCHOSCOPY WITH ENDOBRONCHIAL NAVIGATION N/A 04/23/2017   Procedure: VIDEO BRONCHOSCOPY WITH ENDOBRONCHIAL NAVIGATION;  Surgeon: HenMelrose NakayamaD;  [facility] [place];  Laterality: N/A;  . VIDEO BRONCHOSCOPY WITH ENDOBRONCHIAL ULTRASOUND N/A 04/23/2017   Procedure: VIDEO BRONCHOSCOPY WITH ENDOBRONCHIAL ULTRASOUND;  Surgeon: HenMelrose NakayamaD;  Location: [facility] OR;  Service: Thoracic;  Laterality: N/A;    Family History  Problem Relation Age of Onset  . Diabetes Mother   . Hypertension Mother   .  Diabetes Father   . Asthma Unknown   . Cancer Unknown   . Heart attack Sister        X2  . Colon cancer Neg Hx     Social History   Tobacco Use  . Smoking status: Former Smoker    Years: 20.00    Types: Cigarettes    Last attempt to quit: 08/13/2006    Years since quitting: 11.7  . Smokeless tobacco: Never Used  Substance Use Topics  . Alcohol use: No    Alcohol/week: 0.0 oz  . Drug use: No    Medications:  I have reviewed the patient's current medications. Prior to Admission:  Medications Prior to Admission  Medication Sig Dispense Refill Last Dose  . albuterol (PROAIR HFA) 108 (90 BASE) MCG/ACT inhaler  Inhale 2 puffs into the lungs every 4 (four) hours as needed for wheezing or shortness of breath.   05/24/2018 at Unknown time  . alendronate (FOSAMAX) 70 MG tablet Take 70 mg by mouth every Friday.    Past Week at Unknown time  . aspirin EC 81 MG tablet Take 81 mg by mouth daily.   Past Week at Unknown time  . atorvastatin (LIPITOR) 40 MG tablet Take 40 mg by mouth at bedtime.    Past Week at Unknown time  . B-D ULTRAFINE III SHORT PEN 31G X 8 MM MISC See admin instructions.  1 05/22/2018 at Unknown time  . benzonatate (TESSALON) 200 MG capsule TAKE 1 CAPSULE BY MOUTH THREE TIMES A DAY AS NEEDED  4 Past Week at Unknown time  . buPROPion (WELLBUTRIN SR) 150 MG 12 hr tablet Take 150 mg by mouth 2 (two) times daily.    Past Week at Unknown time  . carbidopa-levodopa (SINEMET IR) 25-100 MG tablet Take 1 tablet by mouth 2 (two) times daily.   Past Week at Unknown time  . cholecalciferol (VITAMIN D) 1000 units tablet Take 1,000 Units by mouth daily.   Past Week at Unknown time  . furosemide (LASIX) 40 MG tablet Take 1 tablet (40 mg total) by mouth daily. Pt is able to take one additional tablet daily for weight increases of 3lbs in a day or 5lbs in a week. Resume on 10/20 30 tablet 0 Past Week at Unknown time  . gabapentin (NEURONTIN) 300 MG capsule Take 600 mg by mouth at bedtime.    Past Week at Unknown time  . Garlic 732 MG TABS Take 300 mg by mouth daily.   Past Week at Unknown time  . ipratropium-albuterol (DUONEB) 0.5-2.5 (3) MG/3ML SOLN Inhale 3 mLs into the lungs every 6 (six) hours as needed (for wheezing/shortness of breath).    Past Week at Unknown time  . KLOR-CON 10 10 MEQ tablet TAKE 1 TABLET (10 MEQ TOTAL) BY MOUTH DAILY. *FURTHER REFILLS NEED TO BE AUTHORIZED BY PCP* (Patient taking differently: TAKE 1 TABLET (10 MEQ TOTAL) BY MOUTH DAILY.) 90 tablet 3 Past Week at Unknown time  . LANTUS SOLOSTAR 100 UNIT/ML Solostar Pen Inject 15 Units into the skin at bedtime.    Past Week at Unknown time   . lidocaine-prilocaine (EMLA) cream Apply to affected area once (Patient taking differently: Apply 1 application topically daily as needed. ) 30 g 3 Past Week at Unknown time  . linagliptin (TRADJENTA) 5 MG TABS tablet Take 5 mg by mouth daily.   Past Week at Unknown time  . loratadine (CLARITIN) 10 MG tablet Take 10 mg by mouth daily.   Past  Week at Unknown time  . losartan (COZAAR) 50 MG tablet Take 50 mg by mouth daily.  3 Past Week at Unknown time  . meclizine (ANTIVERT) 25 MG tablet Take 25 mg by mouth daily.    Past Week at Unknown time  . metoprolol succinate (TOPROL-XL) 25 MG 24 hr tablet Take 1 tablet (25 mg total) by mouth daily. 90 tablet 3 Past Week at 0800  . mometasone-formoterol (DULERA) 200-5 MCG/ACT AERO Inhale 2 puffs into the lungs 2 (two) times daily. 1 Inhaler 1 Past Week at Unknown time  . Omega-3 Fatty Acids (FISH OIL PO) Take 1 capsule by mouth daily.   Past Week at Unknown time  . omeprazole (PRILOSEC) 20 MG capsule Take 20 mg by mouth 2 (two) times daily.  3 Past Week at Unknown time  . ondansetron (ZOFRAN ODT) 4 MG disintegrating tablet Take 1 tablet (4 mg total) by mouth every 8 (eight) hours as needed for nausea or vomiting. 6 tablet 0 Past Week at Unknown time  . OXYGEN Inhale 3 L into the lungs continuous.   05/22/2018 at Unknown time  . polyethylene glycol powder (GLYCOLAX/MIRALAX) powder Take 17 g by mouth daily as needed.   Past Week at Unknown time  . pyridoxine (B-6) 100 MG tablet Take 100 mg by mouth daily.    Past Week at Unknown time  . rOPINIRole (REQUIP) 0.5 MG tablet Take 0.5 mg by mouth 2 (two) times daily.    Past Week at Unknown time  . sodium chloride (OCEAN) 0.65 % SOLN nasal spray Place 1 spray into both nostrils every 3 (three) hours as needed for congestion.    Past Week at Unknown time  . traMADol (ULTRAM) 50 MG tablet Take 50 mg by mouth every 12 (twelve) hours as needed for moderate pain.    Past Week at Unknown time  . TRELEGY ELLIPTA 100-62.5-25  MCG/INH AEPB Inhale 1 puff into the lungs daily.    Past Week at Unknown time  . vitamin B-12 (CYANOCOBALAMIN) 500 MCG tablet Take 500 mcg by mouth daily.  3 Past Week at Unknown time  . zolpidem (AMBIEN) 5 MG tablet Take 5 mg by mouth at bedtime as needed for sleep.   Past Week at Unknown time  . Atezolizumab (TECENTRIQ IV) Inject into the vein. Every 3 weeks   05/20/2018  . nitroGLYCERIN (NITROSTAT) 0.4 MG SL tablet Place 0.4 mg under the tongue every 5 (five) minutes as needed for chest pain.   unknown   Scheduled: . aspirin EC  81 mg Oral Daily  . atorvastatin  40 mg Oral QHS  . enoxaparin (LOVENOX) injection  40 mg Subcutaneous Q24H  . insulin glargine  15 Units Subcutaneous QHS  . ipratropium-albuterol  3 mL Nebulization Q6H  . losartan  50 mg Oral Daily  . metoprolol succinate  25 mg Oral Daily   Continuous: . ampicillin-sulbactam (UNASYN) IV Stopped (05/25/18 1232)   CZY:[id number] **OR** acetaminophen, fentaNYL (SUBLIMAZE) injection, metoprolol tartrate, prochlorperazine  Allergies  Allergen Reactions  . Lisinopril Cough     ROS:  A comprehensive review of systems was negative except for: Respiratory: positive for SOB, difficulty breathing Gastrointestinal: positive for abdominal pain, nausea, vomiting and last BM 2 days prior to admission  Blood pressure 128/63, pulse 96, temperature 98 F (36.7 C), temperature source Oral, resp. rate 20, height '5\' 4"'  (1.626 m), weight 214 lb 4.6 oz (97.2 kg), SpO2 99 %. Physical Exam  Constitutional: She is oriented to person, place,  and time. She appears well-developed.  HENT:  Head: Normocephalic.  Eyes: Pupils are equal, round, and reactive to light.  Cardiovascular: Normal rate.  Pulmonary/Chest: Effort normal.  Abdominal: Normal appearance. There is tenderness in the suprapubic area. There is no rebound and no guarding.  Multiple abdominal scars, open chole, midline scar  Musculoskeletal:  No edema  Neurological: She is  alert and oriented to person, place, and time.  Skin: Skin is warm and dry.  Psychiatric: She has a normal mood and affect. Her behavior is normal.  Vitals reviewed.   Results: Results for orders placed or performed during the hospital encounter of 05/24/18 (from the past 48 hour(s))  Lipase, blood     Status: None   Collection Time: 05/24/18  6:24 PM  Result Value Ref Range   Lipase 24 11 - 51 U/L    Comment: Performed at [facility], [address], [place], [place] [address]  Comprehensive metabolic panel     Status: Abnormal   Collection Time: 05/24/18  6:24 PM  Result Value Ref Range   Sodium 132 (L) 135 - 145 mmol/L   Potassium 4.5 3.5 - 5.1 mmol/L   Chloride 93 (L) 101 - 111 mmol/L   CO2 29 22 - 32 mmol/L   Glucose, Bld 158 (H) 65 - 99 mg/dL   BUN 12 6 - 20 mg/dL   Creatinine, Ser 1.16 (H) 0.44 - 1.00 mg/dL   Calcium 9.3 8.9 - 10.3 mg/dL   Total Protein 7.2 6.5 - 8.1 g/dL   Albumin 3.5 3.5 - 5.0 g/dL   AST 14 (L) 15 - 41 U/L   ALT 14 14 - 54 U/L   Alkaline Phosphatase 66 38 - 126 U/L   Total Bilirubin 1.0 0.3 - 1.2 mg/dL   GFR calc non Af Amer 43 (L) >60 mL/min   GFR calc Af Amer 49 (L) >60 mL/min    Comment: (NOTE) The eGFR has been calculated using the CKD EPI equation. This calculation has not been validated in all clinical situations. eGFR's persistently <60 mL/min signify possible Chronic Kidney Disease.    Anion gap 10 5 - 15    Comment: Performed at [facility], [address], [place], [place] [address]  CBC     Status: Abnormal   Collection Time: 05/24/18  6:24 PM  Result Value Ref Range   WBC 21.6 (H) 4.0 - 10.5 K/uL   RBC 4.53 3.87 - 5.11 MIL/uL   Hemoglobin 12.1 12.0 - 15.0 g/dL   HCT 38.8 36.0 - 46.0 %   MCV 85.7 78.0 - 100.0 fL   MCH 26.7 26.0 - 34.0 pg   MCHC 31.2 30.0 - 36.0 g/dL   RDW 15.2 11.5 - 15.5 %   Platelets 178 150 - 400 K/uL    Comment: Performed at [facility], [address] [place], [place] [address]  Troponin I     Status:  Abnormal   Collection Time: 05/24/18  6:24 PM  Result Value Ref Range   Troponin I 0.28 (HH) <0.03 ng/mL    Comment: CRITICAL RESULT CALLED TO, READ BACK BY AND VERIFIED WITH: POINDEXTER,M AT 2020 ON 6.17.2019 BY ISLEY,B Performed at [facility], [address], [place], [place] [address]   Culture, blood (routine x 2)     Status: None (Preliminary result)   Collection Time: 05/24/18  9:49 PM  Result Value Ref Range   Specimen Description BLOOD LEFT HAND    Special Requests  BOTTLES DRAWN AEROBIC ONLY Blood Culture adequate volume   Culture      NO GROWTH < 12 HOURS Performed at [facility], [address], [place], [place] [address]    Report Status PENDING   Culture, blood (routine x 2)     Status: None (Preliminary result)   Collection Time: 05/24/18  9:58 PM  Result Value Ref Range   Specimen Description BLOOD LEFT HAND    Special Requests      BOTTLES DRAWN AEROBIC ONLY Blood Culture adequate volume   Culture      NO GROWTH < 12 HOURS Performed at [facility], [address], [place], [place] [address]    Report Status PENDING   Troponin I     Status: Abnormal   Collection Time: 05/24/18  9:58 PM  Result Value Ref Range   Troponin I 0.25 (HH) <0.03 ng/mL    Comment: CRITICAL VALUE NOTED.  VALUE IS CONSISTENT WITH PREVIOUSLY REPORTED AND CALLED VALUE. Performed at [facility], [address], [place], [place] [address]   Urinalysis, Routine w reflex microscopic     Status: Abnormal   Collection Time: 05/24/18 10:22 PM  Result Value Ref Range   Color, Urine YELLOW YELLOW   APPearance CLOUDY (A) CLEAR   Specific Gravity, Urine 1.038 (H) 1.005 - 1.030   pH 6.0 5.0 - 8.0   Glucose, UA NEGATIVE NEGATIVE mg/dL   Hgb urine dipstick SMALL (A) NEGATIVE   Bilirubin Urine NEGATIVE NEGATIVE   Ketones, ur NEGATIVE NEGATIVE mg/dL   Protein, ur NEGATIVE NEGATIVE mg/dL   Nitrite NEGATIVE NEGATIVE   Leukocytes, UA LARGE (A) NEGATIVE   RBC / HPF 0-5 0 - 5 RBC/hpf   WBC, UA  >50 (H) 0 - 5 WBC/hpf   Bacteria, UA RARE (A) NONE SEEN   Squamous Epithelial / LPF 0-5 0 - 5   WBC Clumps PRESENT    Non Squamous Epithelial 0-5 (A) NONE SEEN    Comment: Performed at [facility], [address], [place], [place] [address]  MRSA PCR Screening     Status: None   Collection Time: 05/24/18 11:10 PM  Result Value Ref Range   MRSA by PCR NEGATIVE NEGATIVE    Comment:        The GeneXpert MRSA Assay (FDA approved for NASAL specimens only), is one component of a comprehensive MRSA colonization surveillance program. It is not intended to diagnose MRSA infection nor to guide or monitor treatment for MRSA infections. Performed at [facility], [address], [place], [place] [address]   Troponin I     Status: Abnormal   Collection Time: 05/25/18  4:04 AM  Result Value Ref Range   Troponin I 0.28 (HH) <0.03 ng/mL    Comment: CRITICAL RESULT CALLED TO, READ BACK BY AND VERIFIED WITH: HOWARD,C AT 5:25AM ON 05/25/18 BY [facility] Performed at [facility], [address] [place], [place] [address]   CBC WITH DIFFERENTIAL     Status: Abnormal   Collection Time: 05/25/18  4:04 AM  Result Value Ref Range   WBC 20.1 (H) 4.0 - 10.5 K/uL   RBC 4.55 3.87 - 5.11 MIL/uL   Hemoglobin 12.3 12.0 - 15.0 g/dL   HCT 39.0 36.0 - 46.0 %   MCV 85.7 78.0 - 100.0 fL   MCH 27.0 26.0 - 34.0 pg   MCHC 31.5 30.0 - 36.0 g/dL   RDW 15.3 11.5 - 15.5 %   Platelets 188 150 - 400 K/uL  Neutrophils Relative % 84 %   Neutro Abs 16.9 (H) 1.7 - 7.7 K/uL   Lymphocytes Relative 6 %   Lymphs Abs 1.2 0.7 - 4.0 K/uL   Monocytes Relative 10 %   Monocytes Absolute 2.0 (H) 0.1 - 1.0 K/uL   Eosinophils Relative 0 %   Eosinophils Absolute 0.1 0.0 - 0.7 K/uL   Basophils Relative 0 %   Basophils Absolute 0.0 0.0 - 0.1 K/uL    Comment: Performed at [facility], [address], [place], [place] [address]  Basic metabolic panel     Status: Abnormal   Collection Time: 05/25/18  4:04 AM  Result Value Ref  Range   Sodium 130 (L) 135 - 145 mmol/L   Potassium 4.2 3.5 - 5.1 mmol/L   Chloride 96 (L) 101 - 111 mmol/L   CO2 24 22 - 32 mmol/L   Glucose, Bld 156 (H) 65 - 99 mg/dL   BUN 11 6 - 20 mg/dL   Creatinine, Ser 1.10 (H) 0.44 - 1.00 mg/dL   Calcium 9.0 8.9 - 10.3 mg/dL   GFR calc non Af Amer 45 (L) >60 mL/min   GFR calc Af Amer 53 (L) >60 mL/min    Comment: (NOTE) The eGFR has been calculated using the CKD EPI equation. This calculation has not been validated in all clinical situations. eGFR's persistently <60 mL/min signify possible Chronic Kidney Disease.    Anion gap 10 5 - 15    Comment: Performed at [facility], [address], [place], [place] [address]  Troponin I     Status: Abnormal   Collection Time: 05/25/18 12:19 PM  Result Value Ref Range   Troponin I 0.28 (HH) <0.03 ng/mL    Comment: CRITICAL VALUE NOTED.  VALUE IS CONSISTENT WITH PREVIOUSLY REPORTED AND CALLED VALUE. Performed at [facility], [address] [place], [place] [address]    Personally reviewed CT- dilated loops of SB with area where bowel is decompressed but long area of decompressed bowel in the mid/ to right lower quadrant and then area in the right pelvis where bowel slightly decompressed, no free fluid or air  Dg Chest 2 View  Result Date: 05/24/2018 CLINICAL DATA:  84 year old female with shortness of breath and abdominal pain for 4 days. Treated left lung cancer. EXAM: CHEST - 2 VIEW COMPARISON:  Chest radiograph 05/22/2018. Restaging CT chest abdomen and pelvis 03/15/2018, and earlier. FINDINGS: Upright AP and lateral views of the chest. Chronic architectural distortion and volume loss in the left lower lung. Stable lung volumes and mediastinal contours. Stable right chest porta cath. Calcified aortic atherosclerosis. No pneumothorax, pulmonary edema, pleural effusion or acute pulmonary opacity. Stable visualized osseous structures. Negative visible bowel gas pattern. IMPRESSION: Stable post treatment  appearance of the chest. No new cardiopulmonary abnormality. Electronically Signed   By: Genevie Ann M.D.   On: 05/24/2018 19:23   Ct Angio Chest Pe W And/or Wo Contrast  Result Date: 05/24/2018 CLINICAL DATA:  84 y/o F; shortness of breath and abdominal pain for 4 days. Worsening symptoms. History of lung cancer post resection. EXAM: CT ANGIOGRAPHY CHEST WITH CONTRAST TECHNIQUE: Multidetector CT imaging of the chest was performed using the standard protocol during bolus administration of intravenous contrast. Multiplanar CT image reconstructions and MIPs were obtained to evaluate the vascular anatomy. CONTRAST:  80 cc Isovue 370 COMPARISON:  03/15/2018 CT chest. FINDINGS: Cardiovascular: Satisfactory opacification of the pulmonary arteries to the segmental level. No evidence of pulmonary embolism. Normal heart size. No  pericardial effusion. Moderate calcific atherosclerosis of the coronary arteries and aorta. Mediastinum/Nodes: No enlarged mediastinal, hilar, or axillary lymph nodes. Thyroid gland, trachea, and esophagus demonstrate no significant findings. Small hiatal hernia. Lungs/Pleura: Stable postsurgical changes the left inferior hilum and lung base. Stable thick rimmed extrapleural cavity the left lower as ago esophageal recess prior surgery. Increasing ground-glass opacification with patchy areas of consolidation of the dependent left lung may represent progressive posttreatment changes or superimposed pneumonitis. No pleural effusion or interval pneumothorax. There are several small 3-4 mm nodules at the periphery of the lungs which are stable from prior study. Stable scarring in the right lung apex. Stable emphysema. Upper Abdomen: No acute abnormality. Musculoskeletal: Multiple chronic bilateral rib deformities from prior surgery and/or trauma. No acute fracture identified. Review of the MIP images confirms the above findings. IMPRESSION: 1. No pulmonary embolus identified. 2. Increasing ground-glass  opacification and patchy areas of consolidation in the dependent left lung may represent progressive posttreatment changes or superimposed pneumonitis. No gross mass lesion to suggest recurrent disease. 3. Moderate aortic and coronary calcific atherosclerosis. 4. Small hiatal hernia. 5. Stable postsurgical changes in the left infrahilar region. Electronically Signed   By: Kristine Garbe M.D.   On: 05/24/2018 20:37   Ct Abdomen Pelvis W Contrast  Result Date: 05/25/2018 CLINICAL DATA:  Worsening abdominal pain, vomiting and distension for 2 days. History of lung cancer, COPD, hysterectomy, cholecystectomy, diverticulosis. EXAM: CT ABDOMEN AND PELVIS WITH CONTRAST TECHNIQUE: Multidetector CT imaging of the abdomen and pelvis was performed using the standard protocol following bolus administration of intravenous contrast. CONTRAST:  149m ISOVUE-300 IOPAMIDOL (ISOVUE-300) INJECTION 61% COMPARISON:  CT abdomen and pelvis May 22, 2018 and CT chest May 24, 2018. FINDINGS: LOWER CHEST: LEFT lung base fibrosis tenting the esophagus. Distended esophagus with small air contrast level. Worsening LEFT lung base patchy airspace opacity. HEPATOBILIARY: Mild biliary dilatation LEFT lobe of the liver, likely postprocedural. Status post cholecystectomy. No intrahepatic masses. PANCREAS: Nonacute.  Punctate calcification. SPLEEN: Normal. ADRENALS/URINARY TRACT: Kidneys are orthotopic, demonstrating symmetric enhancement. No obstructing nephrolithiasis, hydronephrosis or solid renal masses. Early excretion of contrast limits sensitivity for potential small nonobstructing nephrolithiasis. The unopacified ureters are normal in course and caliber. Delayed imaging through the kidneys demonstrates symmetric prompt contrast excretion within the proximal urinary collecting system. Urinary bladder is well distended containing contrast. Normal adrenal glands. STOMACH/BOWEL: Small bowel dilated to 3.9 cm with air-fluid levels  and mural edema most apparent within the pelvis. Focal inflammatory changes of small bowel in LEFT central pelvis (image 55). Multiple small-bowel transition points in the pelvis. The stomach, large bowel are normal in course and caliber without inflammatory changes. Severe sigmoid and to lesser extent descending colonic diverticulosis. VASCULAR/LYMPHATIC: Mildly patent infrarenal aorta. Moderate calcific atherosclerosis. No lymphadenopathy by CT size criteria. REPRODUCTIVE: Status post hysterectomy. OTHER: Small volume free fluid in the pelvis. No intraperitoneal free air or focal fluid collections. Mesenteric fat stranding mid pelvis. MUSCULOSKELETAL: Nonacute. Moderate to severe LEFT hip osteoarthrosis. Degenerative changes lumbar spine superimposed on congenital canal narrowing. IMPRESSION: 1. Low-grade small bowel obstruction with multiple transition points in the pelvis associated with inflammatory changes, this may be secondary to enteritis or adhesions. 2. LEFT lung base fibrosis with superimposed worsening airspace opacities, this could reflect post radiation pneumonitis or pneumonia. 3. Colonic diverticulosis without acute diverticulitis. Aortic Atherosclerosis (ICD10-I70.0). Electronically Signed   By: CElon AlasM.D.   On: 05/25/2018 05:40   Dg Chest Port 1 View  Result Date: 05/25/2018 CLINICAL DATA:  Shortness of breath. History of lung malignancy resected 10 years ago, COPD, CHF. EXAM: PORTABLE CHEST 1 VIEW COMPARISON:  Chest x-ray and chest CT scan of May 24, 2018 FINDINGS: The right lung is well-expanded and clear. On the left there is increased density at the lung base more conspicuous today. The left hemidiaphragm is further obscured. The cardiac silhouette is enlarged and the pulmonary vascularity mildly engorged. The power port catheter tip projects over the midportion of the SVC. There is calcification in the wall of the aortic arch. IMPRESSION: Increased density in the left lower  lobe similar to that seen on the CT scan yesterday consistent with progressive post radiation change or superimposed pneumonia. Given the worsening over the past day I favor the latter. There is low-grade CHF which is fairly stable. Thoracic aortic atherosclerosis. Electronically Signed   By: David  Martinique M.D.   On: 05/25/2018 11:46    Assessment & Plan:  KEIRSTYN AYDT is a 84 y.o. female with possibly a partial ileus of the bowel related to her pneumonia versus pSBO that was improving. I favor the ileus given the long area of decompressed bowel that does not have any obvious scarring or angulated trajectory for adhesive disease that would cause that much of a length of narrowing.  Potentially could be some degree of enteritis too.  Overall, clinically improving with BMs.  -Clears/ Sips as tolerated for now  -Hopefully treating PNA helps with bowels  -Monitor abdominal exam  -No acute surgical intervention warranted   All questions were answered to the satisfaction of the patient.    Virl Cagey 05/25/2018, 5:58 PM

## 2018-05-25 NOTE — Progress Notes (Signed)
05/25/2018 11:32 AM  Pt developing some acute respiratory distress;  Ordered CXR, lasix IV, SLIV, neb treatment being given.  Confirmed with patient Full Code Status.    Murvin Natal MD

## 2018-05-25 NOTE — Progress Notes (Addendum)
Pharmacy Antibiotic Note  Angela Lawson is a 84 y.o. female admitted on 05/24/2018 with pneumonia.  Pharmacy has been consulted for Vancomycin and Cefepime dosing.  Initial doses given in ED last night.  Plan: Change to Unasyn per Pharmacy Consult Unasyn 3gm IV every 8 hours. Monitor labs, micro and vitals. Dose stable for age, weight, renal function and indication. No pharmacokinetic monitoring needed. Sign off.   Height: 5\' 4"  (162.6 cm) Weight: 214 lb 4.6 oz (97.2 kg) IBW/kg (Calculated) : 54.7  Temp (24hrs), Avg:98.7 F (37.1 C), Min:98.1 F (36.7 C), Max:99.4 F (37.4 C)  Recent Labs  Lab 05/20/18 1104 05/22/18 1555 05/22/18 1606 05/24/18 1824 05/25/18 0404  WBC 10.2  --  11.2* 21.6* 20.1*  CREATININE 1.35* 1.44*  --  1.16* 1.10*    Estimated Creatinine Clearance: 44.6 mL/min (A) (by C-G formula based on SCr of 1.1 mg/dL (H)).    Allergies  Allergen Reactions  . Lisinopril Cough   Antimicrobials this admission:  Vanc 6/17 >> 6/18 Cefepime 6/17 >> 6/18 Unasyn 6/18  Dose adjustments this admission:  n/a   Microbiology results:  6/17 BCx: pending 6/17 UCx:  pending  Sputum:   6/17 MRSA PCR: (-)    Thank you for allowing pharmacy to be a part of this patient's care.  Pricilla Larsson 05/25/2018 7:55 AM

## 2018-05-25 NOTE — Progress Notes (Signed)
Okay to access port per Dr. Wynetta Emery. Discussed with patient, she stated that during her past few chemotherapies they were not able to get blood return and had to do therapy via peripheral IV. Did not attempt to access at this time.

## 2018-05-25 NOTE — Progress Notes (Signed)
Called by RN to give PRN nebulizer. Called by RN again before I could arrive in the ICU to inform me that the patient was requiring 100%NRB. When I arrived in the unit, it was noted that the patient was very tachypneic and SOB. Crackles and rales noted throughout. The patient was given a PRN Albuterol treatment. No change noted from this therapy. MD came in while I was in the room. Discussion of chest xray to be ordered. Patient returned to 100% NRB. HR still 110, SPO2 100%, and RR in the low to mid 20s. RT will reassess

## 2018-05-25 NOTE — H&P (Signed)
History and Physical    Angela Lawson JAS:[id number] DOB: 09/27/1934 DOA: 05/24/2018  PCP: Rosita Fire, MD   Patient coming from: Home.  I have personally briefly reviewed patient's old medical records in [place]  Chief Complaint: Shortness of breath and abdominal pain x4 days.  HPI: Angela Lawson is a 84 y.o. female with medical history significant of lung adenocarcinoma with partial left lung resection, anginal pain, osteoarthritis, back pain, history of nonobstructive CAD, history of NSTEMI in May 2016, Takotsubo cardiomyopathy, chronic diastolic CHF, COPD, diverticulosis, hyperlipidemia, hypertension, history of ventral hernia who is coming to the emergency department due to complaints of progressively worse shortness of breath for several days and worsening of abdominal pain intensity with vomiting of abdominal distention, nausea and multiple episodes of emesis since Sunday.  She denies constipation, melena or hematochezia.  Her last BM was 2 days ago.  She denies fever, but complains of chills and fatigue.  No headache, sore throat, hemoptysis, chest pain, palpitations, dizziness, diaphoresis, pitting edema lower extremities.  She denies dysuria, frequency or hematuria.  Denies pruritus or skin rashes.  No polyuria, polydipsia or polyphagia.  ED Course: Initial vital signs temperature 98.4 F, pulse 102, respirations 22, blood pressure 154/63 mmHg and O2 sat 90%.  He received a 500 mL NS bolus, vancomycin and cefepime in the ED.  Urinalysis shows a cloudy color, with increased specific gravity, small hemoglobinuria, large leukocyte esterase, negative nitrites, but significant pyuria, but rare bacteria.  White count was 21.6, hemoglobin 12.1 g/dL and platelets 178.  Sodium 132, potassium 4.5, chloride 93, CO2 29 mmol S/L.  BUN was 12, creatinine 1.16, glucose 158 and calcium 9.3 mg/dL.  LFTs were unremarkable.  Lipase was normal.  Troponin was elevated at 0.28 mg/dL.   Imaging: Her  2 view chest radiograph did not show any acute cardiopulmonary pathology.  However, CTA chest show increasing groundglass opacification and patchy areas of consolidation in the dependent left lung which may represent progressive posttreatment changes with superimposed pneumonitis.  There was no PE or mass.  Please see images and full radiology report for further detail.  Review of Systems: As per HPI otherwise 10 point review of systems negative.   Past Medical History:  Diagnosis Date  . Adenocarcinoma of lung ([place])    Left lung 2009, resected  . Anginal pain ([place])   . Arthritis   . Back pain   . CHF (congestive heart failure) ([place])   . COPD (chronic obstructive pulmonary disease) ([place])   . Diverticulitis   . Dyslipidemia   . Essential hypertension   . H/O ventral hernia   . Non-obstructive CAD    a. 04/2015 NSTEMI/Cath: LAD 10p, LCX 75m, RCA 59m, 20d, EF 35-40 w/ apical ballooning.  . On home O2    3L N/C  . Osteoporosis   . Osteoporosis 11/04/2015   Managed by Dr. Legrand Rams   . Parkinson's disease ([place])   . Shortness of breath   . Takotsubo cardiomyopathy    a. 04/2015 Echo: EF 45-50%, mid-dist anterior/apical/inferoapical HK w/ hyperdynamic base. Gr 1 DD, mild AI, mild-mod MR, triv TR, PASP 77mmHg;  b. 04/2015 LV gram: Ef 35-40% w/ apical ballooning.  . Type II diabetes mellitus ([place])   . Ventricular bigeminy    a. 04/2015 in setting of NSTEMI/Takotsubo.    Past Surgical History:  Procedure Laterality Date  . ABDOMINAL HYSTERECTOMY    . CARDIAC CATHETERIZATION N/A 04/17/2015   Procedure: Left Heart Cath and Coronary  Angiography;  Surgeon: Troy Sine, MD;  Location: [facility];  Service: Cardiovascular;  Laterality: N/A;  . CHOLECYSTECTOMY    . COLONOSCOPY N/A 09/18/2014   Procedure: COLONOSCOPY;  Surgeon: Danie Binder, MD;  Location[facility];  Service: Endoscopy;  Laterality: N/A;  8:30 AM - moved to 10:30 Rosendo Gros to notify pt  . ECTOPIC PREGNANCY SURGERY      . INCISIONAL HERNIA REPAIR N/A 08/26/2013   Procedure: HERNIA REPAIR INCISIONAL WITH MESH;  Surgeon: Jamesetta So, MD;  Location: [facility];  Service: General;  Laterality: N/A;  . IR FLUORO GUIDE PORT INSERTION RIGHT  04/30/2017  . IR US GUIDE VASC ACCESS RIGHT  04/30/2017  . LUNG CANCER SURGERY    . VIDEO BRONCHOSCOPY WITH ENDOBRONCHIAL NAVIGATION N/A 04/23/2017   Procedure: VIDEO BRONCHOSCOPY WITH ENDOBRONCHIAL NAVIGATION;  Surgeon: Melrose Nakayama, MD;  Location: [facility];  Service: Thoracic;  Laterality: N/A;  . VIDEO BRONCHOSCOPY WITH ENDOBRONCHIAL ULTRASOUND N/A 04/23/2017   Procedure: VIDEO BRONCHOSCOPY WITH ENDOBRONCHIAL ULTRASOUND;  Surgeon: Melrose Nakayama, MD;  Location: [facility];  Service: Thoracic;  Laterality: N/A;     reports that she quit smoking about 11 years ago. Her smoking use included cigarettes. She quit after 20.00 years of use. She has never used smokeless tobacco. She reports that she does not drink alcohol or use drugs.  Allergies  Allergen Reactions  . Lisinopril Cough    Family History  Problem Relation Age of Onset  . Diabetes Mother   . Hypertension Mother   . Diabetes Father   . Asthma Unknown   . Cancer Unknown   . Heart attack Sister        X2  . Colon cancer Neg Hx     Prior to Admission medications   Medication Sig Start Date End Date Taking? Authorizing Provider  albuterol (PROAIR HFA) 108 (90 BASE) MCG/ACT inhaler Inhale 2 puffs into the lungs every 4 (four) hours as needed for wheezing or shortness of breath.    [provider]  alendronate (FOSAMAX) 70 MG tablet Take 70 mg by mouth every Friday.     [provider]  aspirin EC 81 MG tablet Take 81 mg by mouth daily.    [provider]  Atezolizumab (TECENTRIQ IV) Inject into the vein. Every 3 weeks    [provider]  atorvastatin (LIPITOR) 40 MG tablet Take 40 mg by mouth at bedtime.     [provider]  B-D ULTRAFINE III SHORT PEN 31G X 8 MM  MISC See admin instructions. 12/27/17   [provider]  benzonatate (TESSALON) 200 MG capsule TAKE 1 CAPSULE BY MOUTH THREE TIMES A DAY AS NEEDED 04/23/18   [provider]  buPROPion (WELLBUTRIN SR) 150 MG 12 hr tablet Take 150 mg by mouth 2 (two) times daily.     [provider]  carbidopa-levodopa (SINEMET IR) 25-100 MG tablet Take 1 tablet by mouth 2 (two) times daily.    [provider]  cholecalciferol (VITAMIN D) 1000 units tablet Take 1,000 Units by mouth daily.    [provider]  clotrimazole-betamethasone (LOTRISONE) cream Apply 1 application topically 2 (two) times daily. 09/10/17   Holley Bouche, NP  furosemide (LASIX) 40 MG tablet Take 1 tablet (40 mg total) by mouth daily. Pt is able to take one additional tablet daily for weight increases of 3lbs in a day or 5lbs in a week. Resume on 10/20 09/23/17  Kathie Dike, MD  gabapentin (NEURONTIN) 300 MG capsule Take 600 mg by mouth at bedtime.     [provider]  Garlic 092 MG TABS Take 300 mg by mouth daily.    [provider]  ipratropium-albuterol (DUONEB) 0.5-2.5 (3) MG/3ML SOLN Inhale 3 mLs into the lungs every 6 (six) hours as needed (for wheezing/shortness of breath).     [provider]  KLOR-CON 10 10 MEQ tablet TAKE 1 TABLET (10 MEQ TOTAL) BY MOUTH DAILY. *FURTHER REFILLS NEED TO BE AUTHORIZED BY PCP* Patient taking differently: TAKE 1 TABLET (10 MEQ TOTAL) BY MOUTH DAILY. 08/11/17   Lorretta Harp, MD  LANTUS SOLOSTAR 100 UNIT/ML Solostar Pen Inject 15 Units into the skin at bedtime.     [provider]  lidocaine-prilocaine (EMLA) cream Apply to affected area once Patient taking differently: Apply 1 application topically daily as needed.  05/19/17   Brunetta Genera, MD  linagliptin (TRADJENTA) 5 MG TABS tablet Take 5 mg by mouth daily.    [provider]  loratadine (CLARITIN) 10 MG tablet Take 10 mg by mouth daily.    [provider]  losartan (COZAAR) 50 MG tablet Take 50 mg by mouth daily. 02/22/18   [provider]  meclizine (ANTIVERT) 25 MG tablet Take 25 mg by mouth daily.     [provider]  metoprolol succinate (TOPROL-XL) 25 MG 24 hr tablet Take 1 tablet (25 mg total) by mouth daily. 06/26/15   Jettie Booze, MD  mometasone-formoterol [facility]) 200-5 MCG/ACT AERO Inhale 2 puffs into the lungs 2 (two) times daily. 02/08/18   Kathie Dike, MD  nitroGLYCERIN (NITROSTAT) 0.4 MG SL tablet Place 0.4 mg under the tongue every 5 (five) minutes as needed for chest pain.    [provider]  Omega-3 Fatty Acids (FISH OIL PO) Take 1 capsule by mouth daily.    [provider]  omeprazole (PRILOSEC) 20 MG capsule Take 20 mg by mouth 2 (two) times daily. 04/23/18   [provider]  ondansetron (ZOFRAN ODT) 4 MG disintegrating tablet Take 1 tablet (4 mg total) by mouth every 8 (eight) hours as needed for nausea or vomiting. 05/22/18   Francine Graven, DO  ondansetron (ZOFRAN) 8 MG tablet Take 1 tablet (8 mg total) by mouth 2 (two) times daily as needed (Nausea or vomiting). 05/19/17   Brunetta Genera, MD  OXYGEN Inhale 3 L into the lungs continuous.    [provider]  polyethylene glycol powder (GLYCOLAX/MIRALAX) powder Take 17 g by mouth daily as needed.    [provider]  prochlorperazine (COMPAZINE) 10 MG tablet Take 1 tablet (10 mg total) by mouth every 6 (six) hours as needed (Nausea or vomiting). 05/19/17   Brunetta Genera, MD  pyridoxine (B-6) 100 MG tablet Take 100 mg by mouth daily.     [provider]  rOPINIRole (REQUIP) 0.5 MG tablet Take 0.5 mg by mouth 2 (two) times daily.     [provider]  sodium chloride (OCEAN) 0.65 % SOLN nasal spray Place 1 spray into both nostrils every 3 (three) hours as needed for congestion.     [provider]  traMADol (ULTRAM) 50 MG tablet Take 50 mg by mouth every 12  (twelve) hours as needed for moderate pain.     [provider]  TRELEGY ELLIPTA 100-62.5-25 MCG/INH AEPB Inhale 1 puff into the lungs daily.  04/07/18   [provider]  vitamin B-12 (  CYANOCOBALAMIN) 500 MCG tablet Take 500 mcg by mouth daily. 02/15/18   [provider]  zolpidem (AMBIEN) 5 MG tablet Take 5 mg by mouth at bedtime as needed for sleep.    [provider]    Physical Exam: Vitals:   05/24/18 2030 05/24/18 2100 05/24/18 2225 05/24/18 2312  BP: (!) 172/73 (!) 180/91 (!) 182/77 (!) 191/87  Pulse: 100 100 (!) 105 (!) 108  Resp: 20 14 18 19   Temp:      TempSrc:      SpO2: 96% 98% 97% 94%  Weight:      Height:        Constitutional: NAD, calm, comfortable Eyes: PERRL, lids and conjunctivae normal ENMT: Mucous membranes are mildly dry. Posterior pharynx clear of any exudate or lesions. Neck: normal, supple, no masses, no thyromegaly Respiratory: Decreased breath sounds bilaterally with subtle rhonchi, no wheezing, no crackles. Normal respiratory effort. No accessory muscle use.  Cardiovascular: Tachycardic at 101 bpm, no murmurs / rubs / gallops. No extremity edema. 2+ pedal pulses. No carotid bruits.  Abdomen: Distended.  Bowel sounds positive.  Positive epigastric tenderness, no guarding/rebound/masses palpated. No hepatosplenomegaly. Bowel sounds positive.  Musculoskeletal: no clubbing / cyanosis. Good ROM, no contractures. Normal muscle tone.  Skin: no clinically significant rashes, lesions, ulcers seen on limited skin examination. Neurologic: CN 2-12 grossly intact. Sensation intact, DTR normal. Strength 5/5 in all 4.  Psychiatric: Normal judgment and insight. Alert and oriented x 4. Normal mood.   Labs on Admission: I have personally reviewed following labs and imaging studies  CBC: Recent Labs  Lab 05/20/18 1104 05/22/18 1606 05/24/18 1824  WBC 10.2 11.2* 21.6*  NEUTROABS 7.5 8.0*  --   HGB 11.4* 12.0 12.1  HCT 37.7 40.3 38.8    MCV 87.5 88.0 85.7  PLT 212 203 841   Basic Metabolic Panel: Recent Labs  Lab 05/20/18 1104 05/22/18 1555 05/24/18 1824  NA 137 131* 132*  K 4.3 4.0 4.5  CL 101 95* 93*  CO2 28 28 29   GLUCOSE 172* 109* 158*  BUN 24* 18 12  CREATININE 1.35* 1.44* 1.16*  CALCIUM 9.2 9.1 9.3   GFR: Estimated Creatinine Clearance: 35.9 mL/min (A) (by C-G formula based on SCr of 1.16 mg/dL (H)). Liver Function Tests: Recent Labs  Lab 05/20/18 1104 05/22/18 1555 05/24/18 1824  AST 13* 16 14*  ALT 3* <5* 14  ALKPHOS 53 59 66  BILITOT 0.6 0.7 1.0  PROT 6.7 7.4 7.2  ALBUMIN 3.5 3.9 3.5   Recent Labs  Lab 05/22/18 1555 05/24/18 1824  LIPASE 50 24   No results for input(s): AMMONIA in the last 168 hours. Coagulation Profile: No results for input(s): INR, PROTIME in the last 168 hours. Cardiac Enzymes: Recent Labs  Lab 05/24/18 1824 05/24/18 2158  TROPONINI 0.28* 0.25*   BNP (last 3 results) No results for input(s): PROBNP in the last 8760 hours. HbA1C: No results for input(s): HGBA1C in the last 72 hours. CBG: No results for input(s): GLUCAP in the last 168 hours. Lipid Profile: No results for input(s): CHOL, HDL, LDLCALC, TRIG, CHOLHDL, LDLDIRECT in the last 72 hours. Thyroid Function Tests: No results for input(s): TSH, T4TOTAL, FREET4, T3FREE, THYROIDAB in the last 72 hours. Anemia Panel: No results for input(s): VITAMINB12, FOLATE, FERRITIN, TIBC, IRON, RETICCTPCT in the last 72 hours. Urine analysis:    Component Value Date/Time   COLORURINE YELLOW 05/24/2018 2222   APPEARANCEUR CLOUDY (A) 05/24/2018 2222   LABSPEC 1.038 (H)  05/24/2018 2222   PHURINE 6.0 05/24/2018 2222   GLUCOSEU NEGATIVE 05/24/2018 2222   HGBUR SMALL (A) 05/24/2018 2222   BILIRUBINUR NEGATIVE 05/24/2018 2222   KETONESUR NEGATIVE 05/24/2018 2222   PROTEINUR NEGATIVE 05/24/2018 2222   UROBILINOGEN 0.2 04/19/2012 1411   NITRITE NEGATIVE 05/24/2018 2222   LEUKOCYTESUR LARGE (A) 05/24/2018 2222     Radiological Exams on Admission: Dg Chest 2 View  Result Date: 05/24/2018 CLINICAL DATA:  84 year old female with shortness of breath and abdominal pain for 4 days. Treated left lung cancer. EXAM: CHEST - 2 VIEW COMPARISON:  Chest radiograph 05/22/2018. Restaging CT chest abdomen and pelvis 03/15/2018, and earlier. FINDINGS: Upright AP and lateral views of the chest. Chronic architectural distortion and volume loss in the left lower lung. Stable lung volumes and mediastinal contours. Stable right chest porta cath. Calcified aortic atherosclerosis. No pneumothorax, pulmonary edema, pleural effusion or acute pulmonary opacity. Stable visualized osseous structures. Negative visible bowel gas pattern. IMPRESSION: Stable post treatment appearance of the chest. No new cardiopulmonary abnormality. Electronically Signed   By: Genevie Ann M.D.   On: 05/24/2018 19:23   Ct Angio Chest Pe W And/or Wo Contrast  Result Date: 05/24/2018 CLINICAL DATA:  84 y/o F; shortness of breath and abdominal pain for 4 days. Worsening symptoms. History of lung cancer post resection. EXAM: CT ANGIOGRAPHY CHEST WITH CONTRAST TECHNIQUE: Multidetector CT imaging of the chest was performed using the standard protocol during bolus administration of intravenous contrast. Multiplanar CT image reconstructions and MIPs were obtained to evaluate the vascular anatomy. CONTRAST:  80 cc Isovue 370 COMPARISON:  03/15/2018 CT chest. FINDINGS: Cardiovascular: Satisfactory opacification of the pulmonary arteries to the segmental level. No evidence of pulmonary embolism. Normal heart size. No pericardial effusion. Moderate calcific atherosclerosis of the coronary arteries and aorta. Mediastinum/Nodes: No enlarged mediastinal, hilar, or axillary lymph nodes. Thyroid gland, trachea, and esophagus demonstrate no significant findings. Small hiatal hernia. Lungs/Pleura: Stable postsurgical changes the left inferior hilum and lung base. Stable thick rimmed  extrapleural cavity the left lower as ago esophageal recess prior surgery. Increasing ground-glass opacification with patchy areas of consolidation of the dependent left lung may represent progressive posttreatment changes or superimposed pneumonitis. No pleural effusion or interval pneumothorax. There are several small 3-4 mm nodules at the periphery of the lungs which are stable from prior study. Stable scarring in the right lung apex. Stable emphysema. Upper Abdomen: No acute abnormality. Musculoskeletal: Multiple chronic bilateral rib deformities from prior surgery and/or trauma. No acute fracture identified. Review of the MIP images confirms the above findings. IMPRESSION: 1. No pulmonary embolus identified. 2. Increasing ground-glass opacification and patchy areas of consolidation in the dependent left lung may represent progressive posttreatment changes or superimposed pneumonitis. No gross mass lesion to suggest recurrent disease. 3. Moderate aortic and coronary calcific atherosclerosis. 4. Small hiatal hernia. 5. Stable postsurgical changes in the left infrahilar region. Electronically Signed   By: Kristine Garbe M.D.   On: 05/24/2018 20:37   02/06/2018 echocardiogram  ------------------------------------------------------------------- LV EF: 60% -   65%  ------------------------------------------------------------------- Indications:      Elevated Troponin.  ------------------------------------------------------------------- History:   PMH:  Acquired from the patient and from the patient&'s chart.  Chronic obstructive pulmonary disease.  PMH:  NSTEMI (non-ST elevated myocardial infarction) Takotsubo cardiomyopathy. Adenocarcinoma of lung.  Risk factors:  Hypertension. Diabetes mellitus.  ------------------------------------------------------------------- Study Conclusions  - Left ventricle: The cavity size was normal. Wall thickness was   normal. Systolic function was  normal. The estimated  ejection   fraction was in the range of 60% to 65%. Wall motion was normal;   there were no regional wall motion abnormalities. Doppler   parameters are consistent with abnormal left ventricular   relaxation (grade 1 diastolic dysfunction).  Impressions:  - Normal LV systolic function; mild diastolic dysfunction   EKG: Independently reviewed.  Vent. rate 96 BPM PR interval * ms QRS duration 83 ms QT/QTc 355/449 ms P-R-T axes 73 28 81 Sinus rhythm Anteroseptal infarct, age indeterminate  Assessment/Plan Principal Problem:   HCAP (healthcare-associated pneumonia) PSI/PORT Score: Pneumonia Severity Index for CAP is 132. However, the patient received chemotherapy last week.  Will be treating as HCAP.  Admit to stepdown/inpatient. Continue supplemental oxygen. Bronchodilators as needed. Cefepime per pharmacy. Vancomycin per pharmacy. Check strep pneumoniae urinary antigen. Check sputum Gram stain, culture and sensitivity. Follow-up blood culture and sensitivity.  Active Problems:   Abdominal distension Has had more episodes of emesis in stepdown. Abdomen is moderately distended on exam. N.p.o. for now. Check CT abdomen/pelvis with IV and oral contrast.    Elevated troponin Likely due to demand ischemia. Trend troponin level.    CKD (chronic kidney disease) stage 3, GFR 30-59 ml/min (HCC) Creatinine lower and GFR higher than baseline. Follow-up renal function and electrolytes.    Essential hypertension Hold oral meds now I will switch metoprolol to IV equivalent. Monitor blood pressure, heart rate, electrolytes and renal function.    Type II diabetes mellitus (HCC) Last hemoglobin A1c 7.0% in 04/21/2017. Continue Lantus. CBG monitoring with regular insulin sliding scale.    Chronic diastolic CHF (congestive heart failure) (HCC) Echocardiogram from March this year, seems to show improvement when compared to  2016. Hold furosemide,  losartan and metoprolol for now. Resume home medications once cleared for oral intake.    CAD (coronary artery disease) Minimal, nonobstructive. Resume statin, beta-blocker and aspirin once tolerating diet.    Parkinson's disease ([place]) On Sinemet. Restart this medication once GI symptoms resolved or control.    Hyponatremia Likely secondary to GI losses. Continue NS infusion. Follow-up sodium level.    DVT prophylaxis: Lovenox SQ. Code Status: Full code. Family Communication:  Disposition Plan: Admit for IV antibiotic therapy for several days and further work-up. Consults called:  Admission status: Inpatient/stepdown.   Reubin Milan MD [facility] Pager [phone].  If 7PM-7AM, please contact night-coverage [email or web address] Password [email or web address]  05/25/2018, 12:02 AM

## 2018-05-25 NOTE — Progress Notes (Signed)
[facility]  Reviewed scan, dilated loops and multiple areas of decompressed loops, difficult to say what is going on, appears more enteritis like in nature. If vomits, would place NG. Being treated for HCAP so some degree of ileus also possible.  Will see after lunch today due to schedule.   Curlene Labrum, MD [facility] [address] [place], [place] [address] [phone] (office)

## 2018-05-26 ENCOUNTER — Inpatient Hospital Stay (HOSPITAL_COMMUNITY): Payer: Medicare Other

## 2018-05-26 DIAGNOSIS — I5032 Chronic diastolic (congestive) heart failure: Secondary | ICD-10-CM

## 2018-05-26 DIAGNOSIS — E871 Hypo-osmolality and hyponatremia: Secondary | ICD-10-CM

## 2018-05-26 DIAGNOSIS — I110 Hypertensive heart disease with heart failure: Secondary | ICD-10-CM

## 2018-05-26 DIAGNOSIS — R748 Abnormal levels of other serum enzymes: Secondary | ICD-10-CM

## 2018-05-26 DIAGNOSIS — I10 Essential (primary) hypertension: Secondary | ICD-10-CM

## 2018-05-26 DIAGNOSIS — R140 Abdominal distension (gaseous): Secondary | ICD-10-CM

## 2018-05-26 DIAGNOSIS — Z794 Long term (current) use of insulin: Secondary | ICD-10-CM

## 2018-05-26 DIAGNOSIS — G20 Parkinson's disease: Secondary | ICD-10-CM

## 2018-05-26 DIAGNOSIS — K56600 Partial intestinal obstruction, unspecified as to cause: Secondary | ICD-10-CM

## 2018-05-26 DIAGNOSIS — J189 Pneumonia, unspecified organism: Secondary | ICD-10-CM

## 2018-05-26 DIAGNOSIS — K567 Ileus, unspecified: Secondary | ICD-10-CM

## 2018-05-26 DIAGNOSIS — E1122 Type 2 diabetes mellitus with diabetic chronic kidney disease: Secondary | ICD-10-CM

## 2018-05-26 DIAGNOSIS — N183 Chronic kidney disease, stage 3 (moderate): Secondary | ICD-10-CM

## 2018-05-26 DIAGNOSIS — R0602 Shortness of breath: Secondary | ICD-10-CM

## 2018-05-26 LAB — CBC WITH DIFFERENTIAL/PLATELET
Basophils Absolute: 0 10*3/uL (ref 0.0–0.1)
Basophils Relative: 0 %
Eosinophils Absolute: 0.1 10*3/uL (ref 0.0–0.7)
Eosinophils Relative: 1 %
HCT: 39.2 % (ref 36.0–46.0)
Hemoglobin: 12.1 g/dL (ref 12.0–15.0)
Lymphocytes Relative: 5 %
Lymphs Abs: 0.8 10*3/uL (ref 0.7–4.0)
MCH: 26.4 pg (ref 26.0–34.0)
MCHC: 30.9 g/dL (ref 30.0–36.0)
MCV: 85.6 fL (ref 78.0–100.0)
Monocytes Absolute: 2 10*3/uL — ABNORMAL HIGH (ref 0.1–1.0)
Monocytes Relative: 11 %
Neutro Abs: 15.1 10*3/uL — ABNORMAL HIGH (ref 1.7–7.7)
Neutrophils Relative %: 83 %
Platelets: 177 10*3/uL (ref 150–400)
RBC: 4.58 MIL/uL (ref 3.87–5.11)
RDW: 15.4 % (ref 11.5–15.5)
WBC: 18 10*3/uL — ABNORMAL HIGH (ref 4.0–10.5)

## 2018-05-26 LAB — COMPREHENSIVE METABOLIC PANEL
ALT: 11 U/L — ABNORMAL LOW (ref 14–54)
AST: 11 U/L — ABNORMAL LOW (ref 15–41)
Albumin: 2.8 g/dL — ABNORMAL LOW (ref 3.5–5.0)
Alkaline Phosphatase: 60 U/L (ref 38–126)
Anion gap: 12 (ref 5–15)
BUN: 16 mg/dL (ref 6–20)
CO2: 26 mmol/L (ref 22–32)
Calcium: 9 mg/dL (ref 8.9–10.3)
Chloride: 96 mmol/L — ABNORMAL LOW (ref 101–111)
Creatinine, Ser: 1.23 mg/dL — ABNORMAL HIGH (ref 0.44–1.00)
GFR calc Af Amer: 46 mL/min — ABNORMAL LOW (ref >60)
GFR calc non Af Amer: 40 mL/min — ABNORMAL LOW (ref >60)
Glucose, Bld: 146 mg/dL — ABNORMAL HIGH (ref 65–99)
Potassium: 4.2 mmol/L (ref 3.5–5.1)
Sodium: 134 mmol/L — ABNORMAL LOW (ref 135–145)
Total Bilirubin: 1.1 mg/dL (ref 0.3–1.2)
Total Protein: 6.5 g/dL (ref 6.5–8.1)

## 2018-05-26 LAB — URINE CULTURE: Special Requests: NORMAL

## 2018-05-26 LAB — GLUCOSE, CAPILLARY: Glucose-Capillary: 167 mg/dL — ABNORMAL HIGH (ref 65–99)

## 2018-05-26 LAB — ECHOCARDIOGRAM COMPLETE
Height: 64 in
Weight: 3428.59 oz

## 2018-05-26 LAB — PHOSPHORUS: Phosphorus: 3.5 mg/dL (ref 2.5–4.6)

## 2018-05-26 LAB — MAGNESIUM: Magnesium: 1.8 mg/dL (ref 1.7–2.4)

## 2018-05-26 MED ORDER — BOOST / RESOURCE BREEZE PO LIQD CUSTOM
1.0000 | Freq: Three times a day (TID) | ORAL | Status: DC
  Administered 2018-05-26 – 2018-06-04 (×12): 1 via ORAL

## 2018-05-26 MED ORDER — HYDROMORPHONE HCL 1 MG/ML IJ SOLN
1.0000 mg | Freq: Once | INTRAMUSCULAR | Status: AC
  Administered 2018-05-26: 1 mg via INTRAVENOUS
  Filled 2018-05-26: qty 1

## 2018-05-26 MED ORDER — PHENOL 1.4 % MT LIQD
1.0000 | OROMUCOSAL | Status: DC | PRN
  Administered 2018-05-26 – 2018-06-01 (×2): 1 via OROMUCOSAL
  Filled 2018-05-26: qty 177

## 2018-05-26 MED ORDER — BISACODYL 10 MG RE SUPP
10.0000 mg | Freq: Every day | RECTAL | Status: DC
  Administered 2018-05-26 – 2018-06-02 (×8): 10 mg via RECTAL
  Filled 2018-05-26 (×10): qty 1

## 2018-05-26 NOTE — Progress Notes (Signed)
  Echocardiogram 2D Echocardiogram has been performed.  Darlina Sicilian M 05/26/2018, 1:12 PM

## 2018-05-26 NOTE — Progress Notes (Signed)
CRITICAL CARE PROGRESS NOTE    Angela Lawson  YEM:[id number]  DOB: 1934-02-27  DOA: 05/24/2018 PCP: Rosita Fire, MD   Brief Admission Hx: Angela Lawson is a 84 y.o. female with medical history significant of lung adenocarcinoma with partial left lung resection, anginal pain, osteoarthritis, back pain, history of nonobstructive CAD, history of NSTEMI in May 2016, Takotsubo cardiomyopathy, chronic diastolic CHF, COPD, diverticulosis, hyperlipidemia, hypertension, history of ventral hernia who is coming to the emergency department due to complaints of progressively worse shortness of breath for several days and worsening of abdominal pain intensity with vomiting of abdominal distention, nausea and multiple episodes of emesis since Sunday.  MDM/Assessment & Plan:   1. HCAP - Continue IV antibiotics.  Clinically she is improving from a respiratory standpoint. She did have a bout of respiratory distress on 6/18 but improved with IV lasix administration.  Continue supportive therapy.  Vanc discontinued as MRSA screen was negative.  2. Elevated troponin - likely demand ischemia from acute illness, cardiology has seen, awaiting echocardiogram to evaluate for wall motion abnormalities.   3. Stage 3 CKD - creatinine remains fairly stable, will continue to follow.  4. Essential hypertension - Home meds currently held, IV metoprolol ordered as needed.   5. Type 2 DM - blood sugar has been well controlled.  Follow.   6. Stage 4 lung cancer - Pt is being treated in the cancer center and plans to resume treatment when medically stabilized.  7. Hyponatremia - likely SIADH associated with malignancy and some dehydration as well.  Following BMP.  8. Chronic diastolic CHF - Improved after IV lasix was given yesterday.  IV fluids discontinued.  9. CAD - has been fairly stable.  Cardiology has seen and cleared for any potential surgery related to the SBO.  10. Abdominal Pain and partial SBO - Pt still having  significant abdominal pain but has had bowel movements, continue sips with clears for now. Surgery is following as well.  She likely will not require operative management at this time.  Abd xray portable ordered this morning.   11. Parkinson Disease - she is on sinemet for this.   DVT prophylaxis: lovenox Code Status: Full  Family Communication: at bedside Disposition Plan: intensive medical care in SDU required   Consultants:  General surgery  cardiology   Subjective: Pt awake, alert, says she is breathing better but still having a lot of abdominal pain.    Objective: Vitals:   05/26/18 0300 05/26/18 0400 05/26/18 0500 05/26/18 0600  BP:  (!) 135/41 (!) 138/52 (!) 141/59  Pulse: 91 92 91 92  Resp: 15 14 12 11   Temp:   97.9 F (36.6 C)   TempSrc:   Oral   SpO2: 97% 98% 98% 97%  Weight:      Height:        Intake/Output Summary (Last 24 hours) at 05/26/2018 0803 Last data filed at 05/25/2018 2248 Gross per 24 hour  Intake 103.33 ml  Output 400 ml  Net -296.67 ml   Filed Weights   05/24/18 1733 05/24/18 2312 05/25/18 0500  Weight: 69.9 kg (154 lb) 79.2 kg (174 lb 9.7 oz) 97.2 kg (214 lb 4.6 oz)   REVIEW OF SYSTEMS  As per history otherwise all reviewed and reported negative  Exam:  General exam: awake, alert, NAD.   Respiratory system: Clear. No increased work of breathing. Cardiovascular system: S1 & S2 heard, RRR. No JVD, murmurs, gallops, clicks or pedal edema. Gastrointestinal system: Abdomen  is nondistended, soft and nontender.  Hypoactive bowel sounds heard. Central nervous system: Alert and oriented. No focal neurological deficits. Extremities: no CCE.  Data Reviewed: Basic Metabolic Panel: Recent Labs  Lab 05/20/18 1104 05/22/18 1555 05/24/18 1824 05/25/18 0404 05/26/18 0627  NA 137 131* 132* 130* 134*  K 4.3 4.0 4.5 4.2 4.2  CL 101 95* 93* 96* 96*  CO2 28 28 29 24 26   GLUCOSE 172* 109* 158* 156* 146*  BUN 24* 18 12 11 16   CREATININE 1.35*  1.44* 1.16* 1.10* 1.23*  CALCIUM 9.2 9.1 9.3 9.0 9.0  MG  --   --   --   --  1.8  PHOS  --   --   --   --  3.5   Liver Function Tests: Recent Labs  Lab 05/20/18 1104 05/22/18 1555 05/24/18 1824 05/26/18 0627  AST 13* 16 14* 11*  ALT 3* <5* 14 11*  ALKPHOS 53 59 66 60  BILITOT 0.6 0.7 1.0 1.1  PROT 6.7 7.4 7.2 6.5  ALBUMIN 3.5 3.9 3.5 2.8*   Recent Labs  Lab 05/22/18 1555 05/24/18 1824  LIPASE 50 24   No results for input(s): AMMONIA in the last 168 hours. CBC: Recent Labs  Lab 05/20/18 1104 05/22/18 1606 05/24/18 1824 05/25/18 0404 05/26/18 0627  WBC 10.2 11.2* 21.6* 20.1* 18.0*  NEUTROABS 7.5 8.0*  --  16.9* 15.1*  HGB 11.4* 12.0 12.1 12.3 12.1  HCT 37.7 40.3 38.8 39.0 39.2  MCV 87.5 88.0 85.7 85.7 85.6  PLT 212 203 178 188 177   Cardiac Enzymes: Recent Labs  Lab 05/24/18 1824 05/24/18 2158 05/25/18 0404 05/25/18 1219  TROPONINI 0.28* 0.25* 0.28* 0.28*   CBG (last 3)  Recent Labs    05/25/18 2302  GLUCAP 160*   Recent Results (from the past 240 hour(s))  Culture, blood (routine x 2)     Status: None (Preliminary result)   Collection Time: 05/24/18  9:49 PM  Result Value Ref Range Status   Specimen Description BLOOD LEFT HAND  Final   Special Requests   Final    BOTTLES DRAWN AEROBIC ONLY Blood Culture adequate volume   Culture   Final    NO GROWTH < 24 HOURS Performed at [facility], [address], [place], [place] [address]    Report Status PENDING  Incomplete  Culture, blood (routine x 2)     Status: None (Preliminary result)   Collection Time: 05/24/18  9:58 PM  Result Value Ref Range Status   Specimen Description BLOOD LEFT HAND  Final   Special Requests   Final    BOTTLES DRAWN AEROBIC ONLY Blood Culture adequate volume   Culture   Final    NO GROWTH < 24 HOURS Performed at [facility], [address] [place], [place] [address]    Report Status PENDING  Incomplete  MRSA PCR Screening     Status: None   Collection Time: 05/24/18  11:10 PM  Result Value Ref Range Status   MRSA by PCR NEGATIVE NEGATIVE Final    Comment:        The GeneXpert MRSA Assay (FDA approved for NASAL specimens only), is one component of a comprehensive MRSA colonization surveillance program. It is not intended to diagnose MRSA infection nor to guide or monitor treatment for MRSA infections. Performed at [facility], [address] [place],  [address]      Studies: Dg Chest 2 View  Result Date: 05/24/2018 CLINICAL DATA:  84 year old  female with shortness of breath and abdominal pain for 4 days. Treated left lung cancer. EXAM: CHEST - 2 VIEW COMPARISON:  Chest radiograph 05/22/2018. Restaging CT chest abdomen and pelvis 03/15/2018, and earlier. FINDINGS: Upright AP and lateral views of the chest. Chronic architectural distortion and volume loss in the left lower lung. Stable lung volumes and mediastinal contours. Stable right chest porta cath. Calcified aortic atherosclerosis. No pneumothorax, pulmonary edema, pleural effusion or acute pulmonary opacity. Stable visualized osseous structures. Negative visible bowel gas pattern. IMPRESSION: Stable post treatment appearance of the chest. No new cardiopulmonary abnormality. Electronically Signed   By: Genevie Ann M.D.   On: 05/24/2018 19:23   Ct Angio Chest Pe W And/or Wo Contrast  Result Date: 05/24/2018 CLINICAL DATA:  84 y/o F; shortness of breath and abdominal pain for 4 days. Worsening symptoms. History of lung cancer post resection. EXAM: CT ANGIOGRAPHY CHEST WITH CONTRAST TECHNIQUE: Multidetector CT imaging of the chest was performed using the standard protocol during bolus administration of intravenous contrast. Multiplanar CT image reconstructions and MIPs were obtained to evaluate the vascular anatomy. CONTRAST:  80 cc Isovue 370 COMPARISON:  03/15/2018 CT chest. FINDINGS: Cardiovascular: Satisfactory opacification of the pulmonary arteries to the segmental level. No evidence of  pulmonary embolism. Normal heart size. No pericardial effusion. Moderate calcific atherosclerosis of the coronary arteries and aorta. Mediastinum/Nodes: No enlarged mediastinal, hilar, or axillary lymph nodes. Thyroid gland, trachea, and esophagus demonstrate no significant findings. Small hiatal hernia. Lungs/Pleura: Stable postsurgical changes the left inferior hilum and lung base. Stable thick rimmed extrapleural cavity the left lower as ago esophageal recess prior surgery. Increasing ground-glass opacification with patchy areas of consolidation of the dependent left lung may represent progressive posttreatment changes or superimposed pneumonitis. No pleural effusion or interval pneumothorax. There are several small 3-4 mm nodules at the periphery of the lungs which are stable from prior study. Stable scarring in the right lung apex. Stable emphysema. Upper Abdomen: No acute abnormality. Musculoskeletal: Multiple chronic bilateral rib deformities from prior surgery and/or trauma. No acute fracture identified. Review of the MIP images confirms the above findings. IMPRESSION: 1. No pulmonary embolus identified. 2. Increasing ground-glass opacification and patchy areas of consolidation in the dependent left lung may represent progressive posttreatment changes or superimposed pneumonitis. No gross mass lesion to suggest recurrent disease. 3. Moderate aortic and coronary calcific atherosclerosis. 4. Small hiatal hernia. 5. Stable postsurgical changes in the left infrahilar region. Electronically Signed   By: Kristine Garbe M.D.   On: 05/24/2018 20:37   Ct Abdomen Pelvis W Contrast  Result Date: 05/25/2018 CLINICAL DATA:  Worsening abdominal pain, vomiting and distension for 2 days. History of lung cancer, COPD, hysterectomy, cholecystectomy, diverticulosis. EXAM: CT ABDOMEN AND PELVIS WITH CONTRAST TECHNIQUE: Multidetector CT imaging of the abdomen and pelvis was performed using the standard protocol  following bolus administration of intravenous contrast. CONTRAST:  19mL ISOVUE-300 IOPAMIDOL (ISOVUE-300) INJECTION 61% COMPARISON:  CT abdomen and pelvis May 22, 2018 and CT chest May 24, 2018. FINDINGS: LOWER CHEST: LEFT lung base fibrosis tenting the esophagus. Distended esophagus with small air contrast level. Worsening LEFT lung base patchy airspace opacity. HEPATOBILIARY: Mild biliary dilatation LEFT lobe of the liver, likely postprocedural. Status post cholecystectomy. No intrahepatic masses. PANCREAS: Nonacute.  Punctate calcification. SPLEEN: Normal. ADRENALS/URINARY TRACT: Kidneys are orthotopic, demonstrating symmetric enhancement. No obstructing nephrolithiasis, hydronephrosis or solid renal masses. Early excretion of contrast limits sensitivity for potential small nonobstructing nephrolithiasis. The unopacified ureters are normal in course and caliber. Delayed  imaging through the kidneys demonstrates symmetric prompt contrast excretion within the proximal urinary collecting system. Urinary bladder is well distended containing contrast. Normal adrenal glands. STOMACH/BOWEL: Small bowel dilated to 3.9 cm with air-fluid levels and mural edema most apparent within the pelvis. Focal inflammatory changes of small bowel in LEFT central pelvis (image 55). Multiple small-bowel transition points in the pelvis. The stomach, large bowel are normal in course and caliber without inflammatory changes. Severe sigmoid and to lesser extent descending colonic diverticulosis. VASCULAR/LYMPHATIC: Mildly patent infrarenal aorta. Moderate calcific atherosclerosis. No lymphadenopathy by CT size criteria. REPRODUCTIVE: Status post hysterectomy. OTHER: Small volume free fluid in the pelvis. No intraperitoneal free air or focal fluid collections. Mesenteric fat stranding mid pelvis. MUSCULOSKELETAL: Nonacute. Moderate to severe LEFT hip osteoarthrosis. Degenerative changes lumbar spine superimposed on congenital canal  narrowing. IMPRESSION: 1. Low-grade small bowel obstruction with multiple transition points in the pelvis associated with inflammatory changes, this may be secondary to enteritis or adhesions. 2. LEFT lung base fibrosis with superimposed worsening airspace opacities, this could reflect post radiation pneumonitis or pneumonia. 3. Colonic diverticulosis without acute diverticulitis. Aortic Atherosclerosis (ICD10-I70.0). Electronically Signed   By: Elon Alas M.D.   On: 05/25/2018 05:40   Dg Chest Port 1 View  Result Date: 05/25/2018 CLINICAL DATA:  Shortness of breath. History of lung malignancy resected 10 years ago, COPD, CHF. EXAM: PORTABLE CHEST 1 VIEW COMPARISON:  Chest x-ray and chest CT scan of May 24, 2018 FINDINGS: The right lung is well-expanded and clear. On the left there is increased density at the lung base more conspicuous today. The left hemidiaphragm is further obscured. The cardiac silhouette is enlarged and the pulmonary vascularity mildly engorged. The power port catheter tip projects over the midportion of the SVC. There is calcification in the wall of the aortic arch. IMPRESSION: Increased density in the left lower lobe similar to that seen on the CT scan yesterday consistent with progressive post radiation change or superimposed pneumonia. Given the worsening over the past day I favor the latter. There is low-grade CHF which is fairly stable. Thoracic aortic atherosclerosis. Electronically Signed   By: David  Martinique M.D.   On: 05/25/2018 11:46   Dg Abd Portable 1v  Result Date: 05/26/2018 CLINICAL DATA:  Small-bowel obstruction EXAM: PORTABLE ABDOMEN - 1 VIEW COMPARISON:  Abdominal and pelvic CT scan dated May 25, 2018 FINDINGS: There remain loops of mildly distended gas and fluid-filled small bowel in the mid abdomen. No free extraluminal gas collections are observed. No significant colonic or rectal gas or stool is observed. IMPRESSION: Persistent mid to distal small bowel  obstruction. Electronically Signed   By: David  Martinique M.D.   On: 05/26/2018 07:45   Scheduled Meds: . aspirin EC  81 mg Oral Daily  . atorvastatin  40 mg Oral QHS  . enoxaparin (LOVENOX) injection  40 mg Subcutaneous Q24H  . insulin glargine  15 Units Subcutaneous QHS  . ipratropium-albuterol  3 mL Nebulization Q6H  . losartan  50 mg Oral Daily  . metoprolol succinate  25 mg Oral Daily   Continuous Infusions: . ampicillin-sulbactam (UNASYN) IV Stopped (05/26/18 7322)    Principal Problem:   HCAP (healthcare-associated pneumonia) Active Problems:   Elevated troponin   CKD (chronic kidney disease) stage 3, GFR 30-59 ml/min (HCC)   Essential hypertension   Type II diabetes mellitus (HCC)   Chronic diastolic CHF (congestive heart failure) (HCC)   CAD (coronary artery disease)   Parkinson's disease ([place])   Hyponatremia  Abdominal distention   Intestinal occlusion [facility])  Critical Care Time spent: 110minutes  Irwin Brakeman, MD, FAAFP [facility] Pager [phone]  If 7PM-7AM, please contact night-coverage [email or web address] Password [email or web address] 05/26/2018, 8:03 AM    LOS: 2 days

## 2018-05-26 NOTE — Progress Notes (Signed)
Initial Nutrition Assessment  DOCUMENTATION CODES:   Not applicable  INTERVENTION:   Monitor po intake.  Will order Boost Breeze po TID, each supplement provides 250 kcal and 9 grams of protein  NUTRITION DIAGNOSIS:   Increased nutrient needs related to cancer and cancer related treatments as evidenced by stage 4 lung cancer and chemotherapy.  GOAL:   Patient will meet greater than or equal to 90% of their needs  MONITOR:   PO intake, Weight trends, Supplement acceptance  REASON FOR ASSESSMENT:   Malnutrition Screening Tool   ASSESSMENT:  84 y/o female with Hx of adenocarcinoma of lung, CHF, COPD, HTN, O2 at home, DM2.  Admitted for SOB, abdominal pain, and vomiting over several days.  MST score of 2.  Pt reports having a poor appetite for the past week, eating only 50% of what she normally would eat and says that she does not know the reason for it.  When she's feeling well, she usually eats only breakfast and supper.  Pt says she's been drinking a lot of apple juice and water.   Pt says she manages her DM2 moderately with her diet and just takes insulin at night.  A normal morning blood sugar for her is around 120.  At home she takes vitamin D, fish oil, and vitamin B12.    Pt also reports to have lost 3 lbs over the past week that she has had a poor appetite and says her UBW is 173 lbs.  The true current body weight of the pt is hard to determine.  The latest weight recorded is 214 lbs, but this is an outlier from a wt of 174 lbs taken 1 day earlier.  There is another outlier from 05/20/2018 of 154 lbs, but most of the pt's other past wts are in the 170 range.  I am inclined to believe that 174 is her true wt considering the frequency of past wts around this number and her self-reported UBW, however, a reweigh is recommended so that a better understanding of her nutrition status can be determined.  That being said, a NFPE did not show any signs of malnutrition.     Upon  admission, pt reported having vomiting and one episode of diarrhea.  She also reports constipation.  She says she has not had anymore vomiting since admission. Pt is currently on a clear liquid diet.  She reports still having a poor appetite.  If her intake remains at 50% or below, pt will be at risk for developing malnutrition.  Intervention is to monitor intake to try and meet needs and order Boost Breeze TID into increase protein intake.    Labs reviewed: Na: 134, Cl: 96, Creatinine: 1.23, Glucose 146, GFR: 46, AST: 11, ALT: 11 Recent Labs  Lab 05/24/18 1824 05/25/18 0404 05/26/18 0627  NA 132* 130* 134*  K 4.5 4.2 4.2  CL 93* 96* 96*  CO2 29 24 26   BUN 12 11 16   CREATININE 1.16* 1.10* 1.23*  CALCIUM 9.3 9.0 9.0  MG  --   --  1.8  PHOS  --   --  3.5  GLUCOSE 158* 156* 146*   Medications: Lovenox, lantus, unasyn, sublimaze, compazine, zofran   Past Medical History:  Diagnosis Date  . Adenocarcinoma of lung ([place])    Left lung 2009, resected  . Anginal pain ([place])   . Arthritis   . Back pain   . CHF (congestive heart failure) ([place])   . COPD (chronic obstructive pulmonary  disease) ([place])   . Diverticulitis   . Dyslipidemia   . Essential hypertension   . H/O ventral hernia   . Non-obstructive CAD    a. 04/2015 NSTEMI/Cath: LAD 10p, LCX 78m, RCA 39m, 20d, EF 35-40 w/ apical ballooning.  . On home O2    3L N/C  . Osteoporosis   . Osteoporosis 11/04/2015   Managed by Dr. Legrand Rams   . Parkinson's disease ([place])   . Shortness of breath   . Takotsubo cardiomyopathy    a. 04/2015 Echo: EF 45-50%, mid-dist anterior/apical/inferoapical HK w/ hyperdynamic base. Gr 1 DD, mild AI, mild-mod MR, triv TR, PASP 31mmHg;  b. 04/2015 LV gram: Ef 35-40% w/ apical ballooning.  . Type II diabetes mellitus ([place])   . Ventricular bigeminy    a. 04/2015 in setting of NSTEMI/Takotsubo.   NUTRITION - FOCUSED PHYSICAL EXAM:    Most Recent Value  Orbital Region  No depletion  Upper Arm Region  No  depletion  Temple Region  No depletion  Clavicle Bone Region  No depletion  Clavicle and Acromion Bone Region  No depletion  Dorsal Hand  No depletion  Patellar Region  No depletion  Anterior Thigh Region  No depletion  Posterior Calf Region  No depletion  Edema (RD Assessment)  None  Skin  Reviewed  Nails  Reviewed     Diet Order:   Diet Order           Diet clear liquid Room service appropriate? Yes; Fluid consistency: Thin  Diet effective now         EDUCATION NEEDS:   No education needs have been identified at this time  Skin:  Skin Assessment: Reviewed RN Assessment  Last BM:  6/18 (type 5)  Height:   Ht Readings from Last 1 Encounters:  05/24/18 5\' 4"  (1.626 m)   Weight:   Wt Readings from Last 1 Encounters:  05/25/18 214 lb 4.6 oz (97.2 kg)   Ideal Body Weight:  54.4 kg  BMI:  Body mass index is 36.78 kg/m.   Adjusted Body Weight: 60.5 kg95  Estimated Nutritional Needs:   Kcal:  1820-2120 kcal (30-35 kcal/kg adj. BW)  Protein:  95-118 g (1.2-1.5 g/kg actual BW)  Fluid:  1820-2120 mL (28mL/kcal)

## 2018-05-26 NOTE — Progress Notes (Signed)
[facility] Progress Note     Subjective: More distended today and having pain. NG was placed. She reports feeling somewhat better. Minimal out in the canister < 20cc.   Objective: Vital signs in last 24 hours: Temp:  [97.9 F (36.6 C)-99.2 F (37.3 C)] 98.5 F (36.9 C) (06/19 1600) Pulse Rate:  [91-108] 108 (06/19 1200) Resp:  [10-19] 15 (06/19 1200) BP: (133-159)/(41-73) 159/66 (06/19 1200) SpO2:  [85 %-99 %] 95 % (06/19 1430) FiO2 (%):  [32 %] 32 % (06/19 0500) Last BM Date: 05/25/18  Intake/Output from previous day: 06/18 0701 - 06/19 0700 In: 103.3 [IV Piggyback:103.3] Out: 400 [Urine:400] Intake/Output this shift: No intake/output data recorded.  General appearance: alert, cooperative and no distress Resp: nasal canal in place, normal work breathing GI: soft, distended, no rebound or guarding, tender in the lower abdomen  Lab Results:  Recent Labs    05/25/18 0404 05/26/18 0627  WBC 20.1* 18.0*  HGB 12.3 12.1  HCT 39.0 39.2  PLT 188 177   BMET Recent Labs    05/25/18 0404 05/26/18 0627  NA 130* 134*  K 4.2 4.2  CL 96* 96*  CO2 24 26  GLUCOSE 156* 146*  BUN 11 16  CREATININE 1.10* 1.23*  CALCIUM 9.0 9.0   PT/INR No results for input(s): LABPROT, INR in the last 72 hours.  Studies/Results: Dg Chest 2 View  Result Date: 05/24/2018 CLINICAL DATA:  84 year old female with shortness of breath and abdominal pain for 4 days. Treated left lung cancer. EXAM: CHEST - 2 VIEW COMPARISON:  Chest radiograph 05/22/2018. Restaging CT chest abdomen and pelvis 03/15/2018, and earlier. FINDINGS: Upright AP and lateral views of the chest. Chronic architectural distortion and volume loss in the left lower lung. Stable lung volumes and mediastinal contours. Stable right chest porta cath. Calcified aortic atherosclerosis. No pneumothorax, pulmonary edema, pleural effusion or acute pulmonary opacity. Stable visualized osseous structures. Negative visible  bowel gas pattern. IMPRESSION: Stable post treatment appearance of the chest. No new cardiopulmonary abnormality. Electronically Signed   By: Genevie Ann M.D.   On: 05/24/2018 19:23   Ct Angio Chest Pe W And/or Wo Contrast  Result Date: 05/24/2018 CLINICAL DATA:  84 y/o F; shortness of breath and abdominal pain for 4 days. Worsening symptoms. History of lung cancer post resection. EXAM: CT ANGIOGRAPHY CHEST WITH CONTRAST TECHNIQUE: Multidetector CT imaging of the chest was performed using the standard protocol during bolus administration of intravenous contrast. Multiplanar CT image reconstructions and MIPs were obtained to evaluate the vascular anatomy. CONTRAST:  80 cc Isovue 370 COMPARISON:  03/15/2018 CT chest. FINDINGS: Cardiovascular: Satisfactory opacification of the pulmonary arteries to the segmental level. No evidence of pulmonary embolism. Normal heart size. No pericardial effusion. Moderate calcific atherosclerosis of the coronary arteries and aorta. Mediastinum/Nodes: No enlarged mediastinal, hilar, or axillary lymph nodes. Thyroid gland, trachea, and esophagus demonstrate no significant findings. Small hiatal hernia. Lungs/Pleura: Stable postsurgical changes the left inferior hilum and lung base. Stable thick rimmed extrapleural cavity the left lower as ago esophageal recess prior surgery. Increasing ground-glass opacification with patchy areas of consolidation of the dependent left lung may represent progressive posttreatment changes or superimposed pneumonitis. No pleural effusion or interval pneumothorax. There are several small 3-4 mm nodules at the periphery of the lungs which are stable from prior study. Stable scarring in the right lung apex. Stable emphysema. Upper Abdomen: No acute abnormality. Musculoskeletal: Multiple chronic bilateral rib deformities from prior surgery and/or trauma. No acute fracture  identified. Review of the MIP images confirms the above findings. IMPRESSION: 1. No  pulmonary embolus identified. 2. Increasing ground-glass opacification and patchy areas of consolidation in the dependent left lung may represent progressive posttreatment changes or superimposed pneumonitis. No gross mass lesion to suggest recurrent disease. 3. Moderate aortic and coronary calcific atherosclerosis. 4. Small hiatal hernia. 5. Stable postsurgical changes in the left infrahilar region. Electronically Signed   By: Kristine Garbe M.D.   On: 05/24/2018 20:37   Ct Abdomen Pelvis W Contrast  Result Date: 05/25/2018 CLINICAL DATA:  Worsening abdominal pain, vomiting and distension for 2 days. History of lung cancer, COPD, hysterectomy, cholecystectomy, diverticulosis. EXAM: CT ABDOMEN AND PELVIS WITH CONTRAST TECHNIQUE: Multidetector CT imaging of the abdomen and pelvis was performed using the standard protocol following bolus administration of intravenous contrast. CONTRAST:  195mL ISOVUE-300 IOPAMIDOL (ISOVUE-300) INJECTION 61% COMPARISON:  CT abdomen and pelvis May 22, 2018 and CT chest May 24, 2018. FINDINGS: LOWER CHEST: LEFT lung base fibrosis tenting the esophagus. Distended esophagus with small air contrast level. Worsening LEFT lung base patchy airspace opacity. HEPATOBILIARY: Mild biliary dilatation LEFT lobe of the liver, likely postprocedural. Status post cholecystectomy. No intrahepatic masses. PANCREAS: Nonacute.  Punctate calcification. SPLEEN: Normal. ADRENALS/URINARY TRACT: Kidneys are orthotopic, demonstrating symmetric enhancement. No obstructing nephrolithiasis, hydronephrosis or solid renal masses. Early excretion of contrast limits sensitivity for potential small nonobstructing nephrolithiasis. The unopacified ureters are normal in course and caliber. Delayed imaging through the kidneys demonstrates symmetric prompt contrast excretion within the proximal urinary collecting system. Urinary bladder is well distended containing contrast. Normal adrenal glands.  STOMACH/BOWEL: Small bowel dilated to 3.9 cm with air-fluid levels and mural edema most apparent within the pelvis. Focal inflammatory changes of small bowel in LEFT central pelvis (image 55). Multiple small-bowel transition points in the pelvis. The stomach, large bowel are normal in course and caliber without inflammatory changes. Severe sigmoid and to lesser extent descending colonic diverticulosis. VASCULAR/LYMPHATIC: Mildly patent infrarenal aorta. Moderate calcific atherosclerosis. No lymphadenopathy by CT size criteria. REPRODUCTIVE: Status post hysterectomy. OTHER: Small volume free fluid in the pelvis. No intraperitoneal free air or focal fluid collections. Mesenteric fat stranding mid pelvis. MUSCULOSKELETAL: Nonacute. Moderate to severe LEFT hip osteoarthrosis. Degenerative changes lumbar spine superimposed on congenital canal narrowing. IMPRESSION: 1. Low-grade small bowel obstruction with multiple transition points in the pelvis associated with inflammatory changes, this may be secondary to enteritis or adhesions. 2. LEFT lung base fibrosis with superimposed worsening airspace opacities, this could reflect post radiation pneumonitis or pneumonia. 3. Colonic diverticulosis without acute diverticulitis. Aortic Atherosclerosis (ICD10-I70.0). Electronically Signed   By: Elon Alas M.D.   On: 05/25/2018 05:40   Dg Chest Port 1 View  Result Date: 05/26/2018 CLINICAL DATA:  NG tube placement. EXAM: PORTABLE CHEST 1 VIEW COMPARISON:  Chest x-ray from yesterday. FINDINGS: The patient is rotated to the left. Interval placement of an NG tube seen entering the stomach, with the tip below the field of view. Unchanged right chest wall port catheter. Stable cardiomegaly and pulmonary vascular congestion. Low lung volumes with increased right basilar atelectasis and unchanged left lower lobe opacity. No pleural effusion or pneumothorax. No acute osseous abnormality. IMPRESSION: 1. NG tube entering the  stomach with tip below the field of view. 2. Unchanged left lower lobe airspace disease versus post radiation treatment changes. 3. Stable mild pulmonary vascular congestion. Electronically Signed   By: Titus Dubin M.D.   On: 05/26/2018 12:42   Dg Chest [facility] 1 View  Result  Date: 05/25/2018 CLINICAL DATA:  Shortness of breath. History of lung malignancy resected 10 years ago, COPD, CHF. EXAM: PORTABLE CHEST 1 VIEW COMPARISON:  Chest x-ray and chest CT scan of May 24, 2018 FINDINGS: The right lung is well-expanded and clear. On the left there is increased density at the lung base more conspicuous today. The left hemidiaphragm is further obscured. The cardiac silhouette is enlarged and the pulmonary vascularity mildly engorged. The power port catheter tip projects over the midportion of the SVC. There is calcification in the wall of the aortic arch. IMPRESSION: Increased density in the left lower lobe similar to that seen on the CT scan yesterday consistent with progressive post radiation change or superimposed pneumonia. Given the worsening over the past day I favor the latter. There is low-grade CHF which is fairly stable. Thoracic aortic atherosclerosis. Electronically Signed   By: David  Martinique M.D.   On: 05/25/2018 11:46   Dg Abd Portable 1v  Result Date: 05/26/2018 CLINICAL DATA:  Small-bowel obstruction EXAM: PORTABLE ABDOMEN - 1 VIEW COMPARISON:  Abdominal and pelvic CT scan dated May 25, 2018 FINDINGS: There remain loops of mildly distended gas and fluid-filled small bowel in the mid abdomen. No free extraluminal gas collections are observed. No significant colonic or rectal gas or stool is observed. IMPRESSION: Persistent mid to distal small bowel obstruction. Electronically Signed   By: David  Martinique M.D.   On: 05/26/2018 07:45    Anti-infectives: Anti-infectives (From admission, onward)   Start     Dose/Rate Route Frequency Ordered Stop   05/26/18 0600  ceFEPIme (MAXIPIME) 2 g in sodium  chloride 0.9 % 100 mL IVPB  Status:  Discontinued     2 g 200 mL/hr over 30 Minutes Intravenous Every 24 hours 05/25/18 0755 05/25/18 1005   05/25/18 1800  vancomycin (VANCOCIN) 1,250 mg in sodium chloride 0.9 % 250 mL IVPB  Status:  Discontinued     1,250 mg 166.7 mL/hr over 90 Minutes Intravenous Every 24 hours 05/25/18 0755 05/25/18 1005   05/25/18 1200  Ampicillin-Sulbactam (UNASYN) 3 g in sodium chloride 0.9 % 100 mL IVPB     3 g 200 mL/hr over 30 Minutes Intravenous Every 8 hours 05/25/18 1010     05/25/18 0500  ceFEPIme (MAXIPIME) 1 g in sodium chloride 0.9 % 100 mL IVPB  Status:  Discontinued     1 g 200 mL/hr over 30 Minutes Intravenous Every 8 hours 05/25/18 0024 05/25/18 0755   05/25/18 0030  ceFEPIme (MAXIPIME) 1 g in sodium chloride 0.9 % 100 mL IVPB  Status:  Discontinued     1 g 200 mL/hr over 30 Minutes Intravenous Every 8 hours 05/25/18 0024 05/25/18 0024   05/24/18 2100  ceFEPIme (MAXIPIME) 2 g in sodium chloride 0.9 % 100 mL IVPB     2 g 200 mL/hr over 30 Minutes Intravenous  Once 05/24/18 2049 05/24/18 2235   05/24/18 2100  vancomycin (VANCOCIN) IVPB 1000 mg/200 mL premix     1,000 mg 200 mL/hr over 60 Minutes Intravenous  Once 05/24/18 2049 05/24/18 2341      Assessment/Plan: Angela Lawson is a 84 y.o. female with pSBO versus ileus of the bowel related to her pneumonia.  She is doing fair but did have more distention and pain. NG was placed today with minimal output. -NPO, NG in place -Dulcolax suppository ordered given some solid stool in vault on CT, did have 2 BMs yesterday, but this might help move things distal  -Monitor  abdominal exam  -No acute surgical intervention warranted     LOS: 2 days    Virl Cagey 05/26/2018

## 2018-05-26 NOTE — Progress Notes (Addendum)
   As outlined in the consult note from yesterday, no plans for further ischemic evaluation at this time given her catheterization in 2016 showed mild, nonobstructive CAD. A repeat echocardiogram has been ordered to reassess LV function. To be performed today. Will follow-up on results.   Signed, Erma Heritage, PA-C 05/26/2018, 7:32 AM Pager: [phone]  Agree with above. Will follow up echocardiogram to be performed today.

## 2018-05-27 LAB — COMPREHENSIVE METABOLIC PANEL
ALT: 9 U/L — ABNORMAL LOW (ref 14–54)
AST: 9 U/L — ABNORMAL LOW (ref 15–41)
Albumin: 2.7 g/dL — ABNORMAL LOW (ref 3.5–5.0)
Alkaline Phosphatase: 64 U/L (ref 38–126)
Anion gap: 9 (ref 5–15)
BUN: 15 mg/dL (ref 6–20)
CO2: 29 mmol/L (ref 22–32)
Calcium: 9 mg/dL (ref 8.9–10.3)
Chloride: 99 mmol/L — ABNORMAL LOW (ref 101–111)
Creatinine, Ser: 0.99 mg/dL (ref 0.44–1.00)
GFR calc Af Amer: 60 mL/min — ABNORMAL LOW (ref >60)
GFR calc non Af Amer: 52 mL/min — ABNORMAL LOW (ref >60)
Glucose, Bld: 142 mg/dL — ABNORMAL HIGH (ref 65–99)
Potassium: 4.1 mmol/L (ref 3.5–5.1)
Sodium: 137 mmol/L (ref 135–145)
Total Bilirubin: 0.7 mg/dL (ref 0.3–1.2)
Total Protein: 6.5 g/dL (ref 6.5–8.1)

## 2018-05-27 LAB — STREP PNEUMONIAE URINARY ANTIGEN: Strep Pneumo Urinary Antigen: NEGATIVE

## 2018-05-27 LAB — CBC WITH DIFFERENTIAL/PLATELET
Basophils Absolute: 0 10*3/uL (ref 0.0–0.1)
Basophils Relative: 0 %
Eosinophils Absolute: 0.2 10*3/uL (ref 0.0–0.7)
Eosinophils Relative: 1 %
HCT: 35.4 % — ABNORMAL LOW (ref 36.0–46.0)
Hemoglobin: 11 g/dL — ABNORMAL LOW (ref 12.0–15.0)
Lymphocytes Relative: 4 %
Lymphs Abs: 0.8 10*3/uL (ref 0.7–4.0)
MCH: 26.8 pg (ref 26.0–34.0)
MCHC: 31.1 g/dL (ref 30.0–36.0)
MCV: 86.3 fL (ref 78.0–100.0)
Monocytes Absolute: 2.3 10*3/uL — ABNORMAL HIGH (ref 0.1–1.0)
Monocytes Relative: 11 %
Neutro Abs: 18.2 10*3/uL — ABNORMAL HIGH (ref 1.7–7.7)
Neutrophils Relative %: 84 %
Platelets: 179 10*3/uL (ref 150–400)
RBC: 4.1 MIL/uL (ref 3.87–5.11)
RDW: 15.5 % (ref 11.5–15.5)
WBC: 21.5 10*3/uL — ABNORMAL HIGH (ref 4.0–10.5)

## 2018-05-27 LAB — GLUCOSE, CAPILLARY
Glucose-Capillary: 167 mg/dL — ABNORMAL HIGH (ref 65–99)
Glucose-Capillary: 172 mg/dL — ABNORMAL HIGH (ref 65–99)
Glucose-Capillary: 126 mg/dL — ABNORMAL HIGH (ref 65–99)

## 2018-05-27 LAB — MAGNESIUM: Magnesium: 2.1 mg/dL (ref 1.7–2.4)

## 2018-05-27 MED ORDER — HYDROMORPHONE HCL 1 MG/ML IJ SOLN
1.0000 mg | Freq: Once | INTRAMUSCULAR | Status: AC
  Administered 2018-05-27: 1 mg via INTRAVENOUS
  Filled 2018-05-27: qty 1

## 2018-05-27 MED ORDER — SODIUM CHLORIDE 0.9 % IN NEBU
INHALATION_SOLUTION | RESPIRATORY_TRACT | Status: AC
  Filled 2018-05-27: qty 3

## 2018-05-27 MED ORDER — IPRATROPIUM-ALBUTEROL 0.5-2.5 (3) MG/3ML IN SOLN
3.0000 mL | RESPIRATORY_TRACT | Status: DC | PRN
  Administered 2018-05-28: 3 mL via RESPIRATORY_TRACT

## 2018-05-27 MED ORDER — INSULIN ASPART 100 UNIT/ML ~~LOC~~ SOLN
0.0000 [IU] | SUBCUTANEOUS | Status: DC
  Administered 2018-05-27: 1 [IU] via SUBCUTANEOUS
  Administered 2018-05-27 (×2): 2 [IU] via SUBCUTANEOUS
  Administered 2018-05-28 – 2018-05-31 (×7): 1 [IU] via SUBCUTANEOUS
  Administered 2018-05-31 (×2): 2 [IU] via SUBCUTANEOUS
  Administered 2018-06-01: 3 [IU] via SUBCUTANEOUS
  Administered 2018-06-01 – 2018-06-02 (×3): 1 [IU] via SUBCUTANEOUS
  Administered 2018-06-02: 3 [IU] via SUBCUTANEOUS
  Administered 2018-06-02: 1 [IU] via SUBCUTANEOUS
  Administered 2018-06-02: 2 [IU] via SUBCUTANEOUS
  Administered 2018-06-02: 3 [IU] via SUBCUTANEOUS
  Administered 2018-06-03: 2 [IU] via SUBCUTANEOUS
  Administered 2018-06-03: 1 [IU] via SUBCUTANEOUS
  Administered 2018-06-03: 2 [IU] via SUBCUTANEOUS
  Administered 2018-06-03: 1 [IU] via SUBCUTANEOUS
  Administered 2018-06-04: 2 [IU] via SUBCUTANEOUS
  Administered 2018-06-04: 1 [IU] via SUBCUTANEOUS
  Administered 2018-06-04: 2 [IU] via SUBCUTANEOUS

## 2018-05-27 NOTE — Progress Notes (Signed)
PROGRESS NOTE    Angela Lawson  PFX:[id number] DOB: [birth year] DOA: 05/24/2018 PCP: Rosita Fire, MD    Brief Narrative:  84 year old female with a history of lung cancer coronary artery disease, diastolic heart failure, COPD, presents to the hospital with complaints of worsening shortness of breath as well as abdominal pain, vomiting and distention.  Found to have pneumonia as well as bowel obstruction.  Started on intravenous antibiotics.  General surgery following and NG tube placed for decompression.   Assessment & Plan:   Principal Problem:   HCAP (healthcare-associated pneumonia) Active Problems:   Elevated troponin   CKD (chronic kidney disease) stage 3, GFR 30-59 ml/min (HCC)   Essential hypertension   Type II diabetes mellitus (HCC)   Chronic diastolic CHF (congestive heart failure) (HCC)   CAD (coronary artery disease)   Parkinson's disease (HCC)   Hyponatremia   Abdominal distention   Intestinal occlusion (HCC)   Partial small bowel obstruction ([place])   1. Pneumonia.  Currently on intravenous antibiotics with Unasyn.  Vancomycin discontinued as MRSA screen was negative.  Respiratory status appears to be stabilizing. 2. Elevated troponin.  Likely demand ischemia from acute illness.  Echocardiogram does not show any wall motion abnormalities.  Cardiology is seeing the patient.  No plans for further ischemic work-up at this time. 3. Small bowel obstruction versus ileus.  General surgery following.  NG tube in place.  Had small bowel movement this morning, but mostly mucus per staff.  Continue supportive measures. 4. Parkinson's disease.  Patient is on Sinemet. 5. Hyponatremia.  Possibly related to dehydration.  Improved with hydration. 6. Chronic diastolic CHF.  Was given 1 dose of IV Lasix on 6/18.  Respiratory status currently stable. 7. Diabetes.  Currently on Lantus.  Blood sugar stable.  Add sliding scale. 8. Chronic kidney disease stage III.  Creatinine currently at  baseline.  Continue to monitor. 9. Stage IV lung cancer.  Patient is being treated at the cancer center and plans to resume treatment when medically stable.   DVT prophylaxis: Lovenox Code Status: full code Family Communication: Discussed with family members at the bedside Disposition Plan: Discharge home once improved   Consultants:   Cardiology  General surgery  Procedures:  Echocardiogram: - Left ventricle: The cavity size was normal. Systolic function was   vigorous. The estimated ejection fraction was in the range of 65%   to 70%. Wall motion was normal; there were no regional wall   motion abnormalities. Doppler parameters are consistent with   abnormal left ventricular relaxation (grade 1 diastolic   dysfunction). Doppler parameters are consistent with high   ventricular filling pressure. Mild concentric and moderate focal   basal septal hypertrophy. - Aortic valve: There was trivial regurgitation. - Mitral valve: Mildly calcified annulus. - Pericardium, extracardiac: Trivial posterior pericardial    effusion.  Antimicrobials:   Ampicillin 6/18 >   Subjective: Continues to have abdominal pain.  Denies any shortness of breath.  Had a small bowel movement this morning.  Staff said it was mostly mucus.  Objective: Vitals:   05/27/18 0700 05/27/18 0800 05/27/18 0835 05/27/18 0900  BP: (!) 151/82 (!) 149/57  (!) 135/92  Pulse: 98 (!) 109  (!) 110  Resp: 13 15  (!) 21  Temp:      TempSrc:      SpO2: 94% 93% 96% 93%  Weight:      Height:        Intake/Output Summary (Last 24 hours) at 05/27/2018 1019  Last data filed at 05/27/2018 0100 Gross per 24 hour  Intake 650 ml  Output -  Net 650 ml   Filed Weights   05/24/18 2312 05/25/18 0500 05/27/18 0500  Weight: 79.2 kg (174 lb 9.7 oz) 97.2 kg (214 lb 4.6 oz) 76.5 kg (168 lb 10.4 oz)    Examination:  General exam: Appears calm and comfortable  Respiratory system: Clear to auscultation. Respiratory effort  normal. Cardiovascular system: S1 & S2 heard, RRR. No JVD, murmurs, rubs, gallops or clicks. No pedal edema. Gastrointestinal system: Abdomen is nondistended, soft and nontender. No organomegaly or masses felt. Normal bowel sounds heard. Central nervous system: Alert and oriented. No focal neurological deficits. Extremities: Symmetric 5 x 5 power. Skin: No rashes, lesions or ulcers Psychiatry: Judgement and insight appear normal. Mood & affect appropriate.     Data Reviewed: I have personally reviewed following labs and imaging studies  CBC: Recent Labs  Lab 05/20/18 1104 05/22/18 1606 05/24/18 1824 05/25/18 0404 05/26/18 0627 05/27/18 0654  WBC 10.2 11.2* 21.6* 20.1* 18.0* 21.5*  NEUTROABS 7.5 8.0*  --  16.9* 15.1* 18.2*  HGB 11.4* 12.0 12.1 12.3 12.1 11.0*  HCT 37.7 40.3 38.8 39.0 39.2 35.4*  MCV 87.5 88.0 85.7 85.7 85.6 86.3  PLT 212 203 178 188 177 102   Basic Metabolic Panel: Recent Labs  Lab 05/22/18 1555 05/24/18 1824 05/25/18 0404 05/26/18 0627 05/27/18 0654  NA 131* 132* 130* 134* 137  K 4.0 4.5 4.2 4.2 4.1  CL 95* 93* 96* 96* 99*  CO2 28 29 24 26 29   GLUCOSE 109* 158* 156* 146* 142*  BUN 18 12 11 16 15   CREATININE 1.44* 1.16* 1.10* 1.23* 0.99  CALCIUM 9.1 9.3 9.0 9.0 9.0  MG  --   --   --  1.8 2.1  PHOS  --   --   --  3.5  --    GFR: Estimated Creatinine Clearance: 43.8 mL/min (by C-G formula based on SCr of 0.99 mg/dL). Liver Function Tests: Recent Labs  Lab 05/20/18 1104 05/22/18 1555 05/24/18 1824 05/26/18 0627 05/27/18 0654  AST 13* 16 14* 11* 9*  ALT 3* <5* 14 11* 9*  ALKPHOS 53 59 66 60 64  BILITOT 0.6 0.7 1.0 1.1 0.7  PROT 6.7 7.4 7.2 6.5 6.5  ALBUMIN 3.5 3.9 3.5 2.8* 2.7*   Recent Labs  Lab 05/22/18 1555 05/24/18 1824  LIPASE 50 24   No results for input(s): AMMONIA in the last 168 hours. Coagulation Profile: No results for input(s): INR, PROTIME in the last 168 hours. Cardiac Enzymes: Recent Labs  Lab 05/24/18 1824  05/24/18 2158 05/25/18 0404 05/25/18 1219  TROPONINI 0.28* 0.25* 0.28* 0.28*   BNP (last 3 results) No results for input(s): PROBNP in the last 8760 hours. HbA1C: No results for input(s): HGBA1C in the last 72 hours. CBG: Recent Labs  Lab 05/25/18 2302 05/26/18 2152  GLUCAP 160* 167*   Lipid Profile: No results for input(s): CHOL, HDL, LDLCALC, TRIG, CHOLHDL, LDLDIRECT in the last 72 hours. Thyroid Function Tests: No results for input(s): TSH, T4TOTAL, FREET4, T3FREE, THYROIDAB in the last 72 hours. Anemia Panel: No results for input(s): VITAMINB12, FOLATE, FERRITIN, TIBC, IRON, RETICCTPCT in the last 72 hours. Sepsis Labs: No results for input(s): PROCALCITON, LATICACIDVEN in the last 168 hours.  Recent Results (from the past 240 hour(s))  Culture, blood (routine x 2)     Status: None (Preliminary result)   Collection Time: 05/24/18  9:49 PM  Result  Value Ref Range Status   Specimen Description BLOOD LEFT HAND  Final   Special Requests   Final    BOTTLES DRAWN AEROBIC ONLY Blood Culture adequate volume   Culture   Final    NO GROWTH 3 DAYS Performed at [facility], [address], [place], [place] [address]    Report Status PENDING  Incomplete  Culture, blood (routine x 2)     Status: None (Preliminary result)   Collection Time: 05/24/18  9:58 PM  Result Value Ref Range Status   Specimen Description BLOOD LEFT HAND  Final   Special Requests   Final    BOTTLES DRAWN AEROBIC ONLY Blood Culture adequate volume   Culture   Final    NO GROWTH 3 DAYS Performed at [facility], [address], [place], [place] [address]    Report Status PENDING  Incomplete  Urine culture     Status: Abnormal   Collection Time: 05/24/18 10:22 PM  Result Value Ref Range Status   Specimen Description   Final    URINE, CLEAN CATCH Performed at [facility], [address], [place], [place] [address]    Special Requests   Final    Normal Performed at [facility], [address]  [place], [place] [address]    Culture MULTIPLE SPECIES PRESENT, SUGGEST RECOLLECTION (A)  Final   Report Status 05/26/2018 FINAL  Final  MRSA PCR Screening     Status: None   Collection Time: 05/24/18 11:10 PM  Result Value Ref Range Status   MRSA by PCR NEGATIVE NEGATIVE Final    Comment:        The GeneXpert MRSA Assay (FDA approved for NASAL specimens only), is one component of a comprehensive MRSA colonization surveillance program. It is not intended to diagnose MRSA infection nor to guide or monitor treatment for MRSA infections. Performed at [facility], [address] [place],  [address]          Radiology Studies: Dg Chest [facility] 1 View  Result Date: 05/26/2018 CLINICAL DATA:  NG tube placement. EXAM: PORTABLE CHEST 1 VIEW COMPARISON:  Chest x-ray from yesterday. FINDINGS: The patient is rotated to the left. Interval placement of an NG tube seen entering the stomach, with the tip below the field of view. Unchanged right chest wall port catheter. Stable cardiomegaly and pulmonary vascular congestion. Low lung volumes with increased right basilar atelectasis and unchanged left lower lobe opacity. No pleural effusion or pneumothorax. No acute osseous abnormality. IMPRESSION: 1. NG tube entering the stomach with tip below the field of view. 2. Unchanged left lower lobe airspace disease versus post radiation treatment changes. 3. Stable mild pulmonary vascular congestion. Electronically Signed   By: Titus Dubin M.D.   On: 05/26/2018 12:42   Dg Chest Port 1 View  Result Date: 05/25/2018 CLINICAL DATA:  Shortness of breath. History of lung malignancy resected 10 years ago, COPD, CHF. EXAM: PORTABLE CHEST 1 VIEW COMPARISON:  Chest x-ray and chest CT scan of May 24, 2018 FINDINGS: The right lung is well-expanded and clear. On the left there is increased density at the lung base more conspicuous today. The left hemidiaphragm is further obscured. The cardiac silhouette is enlarged  and the pulmonary vascularity mildly engorged. The power port catheter tip projects over the midportion of the SVC. There is calcification in the wall of the aortic arch. IMPRESSION: Increased density in the left lower lobe similar to that seen on the CT scan yesterday consistent with progressive  post radiation change or superimposed pneumonia. Given the worsening over the past day I favor the latter. There is low-grade CHF which is fairly stable. Thoracic aortic atherosclerosis. Electronically Signed   By: David  Martinique M.D.   On: 05/25/2018 11:46   Dg Abd Portable 1v  Result Date: 05/26/2018 CLINICAL DATA:  Small-bowel obstruction EXAM: PORTABLE ABDOMEN - 1 VIEW COMPARISON:  Abdominal and pelvic CT scan dated May 25, 2018 FINDINGS: There remain loops of mildly distended gas and fluid-filled small bowel in the mid abdomen. No free extraluminal gas collections are observed. No significant colonic or rectal gas or stool is observed. IMPRESSION: Persistent mid to distal small bowel obstruction. Electronically Signed   By: David  Martinique M.D.   On: 05/26/2018 07:45        Scheduled Meds: . aspirin EC  81 mg Oral Daily  . atorvastatin  40 mg Oral QHS  . bisacodyl  10 mg Rectal Daily  . enoxaparin (LOVENOX) injection  40 mg Subcutaneous Q24H  . feeding supplement  1 Container Oral TID BM  . insulin glargine  15 Units Subcutaneous QHS  . ipratropium-albuterol  3 mL Nebulization Q6H  . losartan  50 mg Oral Daily  . metoprolol succinate  25 mg Oral Daily   Continuous Infusions: . ampicillin-sulbactam (UNASYN) IV Stopped (05/27/18 0535)     LOS: 3 days    Time spent: 35 minutes.  Greater than 50% of this time spent in direct contact with patient discussing management of small bowel obstruction with NG tube, possibly need for surgery, management of pneumonia with antibiotics    Kathie Dike, MD [facility] Pager [phone]  If 7PM-7AM, please contact  night-coverage [email or web address] Password [facility] 05/27/2018, 10:19 AM

## 2018-05-27 NOTE — Progress Notes (Signed)
Report given to 300 nurse.  Patient taken to room via wheelchair.

## 2018-05-27 NOTE — Plan of Care (Signed)
Patient agrees with plan of care.

## 2018-05-27 NOTE — Progress Notes (Signed)
[facility] Progress Note     Subjective: Feeling bloated still. Pain in the lower abdomen. No flatus or BMs voluntarily but some BM with suppository reported.   Objective: Vital signs in last 24 hours: Temp:  [98.3 F (36.8 C)-98.5 F (36.9 C)] 98.5 F (36.9 C) (06/20 1154) Pulse Rate:  [89-112] 97 (06/20 1600) Resp:  [11-21] 17 (06/20 1600) BP: (117-201)/(43-186) 160/60 (06/20 1600) SpO2:  [91 %-98 %] 95 % (06/20 1600) Weight:  [168 lb 10.4 oz (76.5 kg)] 168 lb 10.4 oz (76.5 kg) (06/20 0500) Last BM Date: 05/25/18  Intake/Output from previous day: 06/19 0701 - 06/20 0700 In: 650 [NG/GT:200; IV Piggyback:450] Out: -  Intake/Output this shift: Total I/O In: 710 [P.O.:420; IV Piggyback:290] Out: 650 [Urine:650]  General appearance: alert, cooperative and no distress Resp: normal work breathing GI: soft, distended, tender in the lower quadrant, no rebound or guarding  Lab Results:  Recent Labs    05/26/18 0627 05/27/18 0654  WBC 18.0* 21.5*  HGB 12.1 11.0*  HCT 39.2 35.4*  PLT 177 179   BMET Recent Labs    05/26/18 0627 05/27/18 0654  NA 134* 137  K 4.2 4.1  CL 96* 99*  CO2 26 29  GLUCOSE 146* 142*  BUN 16 15  CREATININE 1.23* 0.99  CALCIUM 9.0 9.0   PT/INR No results for input(s): LABPROT, INR in the last 72 hours.  Studies/Results: Dg Chest Port 1 View  Result Date: 05/26/2018 CLINICAL DATA:  NG tube placement. EXAM: PORTABLE CHEST 1 VIEW COMPARISON:  Chest x-ray from yesterday. FINDINGS: The patient is rotated to the left. Interval placement of an NG tube seen entering the stomach, with the tip below the field of view. Unchanged right chest wall port catheter. Stable cardiomegaly and pulmonary vascular congestion. Low lung volumes with increased right basilar atelectasis and unchanged left lower lobe opacity. No pleural effusion or pneumothorax. No acute osseous abnormality. IMPRESSION: 1. NG tube entering the stomach with tip below  the field of view. 2. Unchanged left lower lobe airspace disease versus post radiation treatment changes. 3. Stable mild pulmonary vascular congestion. Electronically Signed   By: Titus Dubin M.D.   On: 05/26/2018 12:42   Dg Abd Portable 1v  Result Date: 05/26/2018 CLINICAL DATA:  Small-bowel obstruction EXAM: PORTABLE ABDOMEN - 1 VIEW COMPARISON:  Abdominal and pelvic CT scan dated May 25, 2018 FINDINGS: There remain loops of mildly distended gas and fluid-filled small bowel in the mid abdomen. No free extraluminal gas collections are observed. No significant colonic or rectal gas or stool is observed. IMPRESSION: Persistent mid to distal small bowel obstruction. Electronically Signed   By: David  Martinique M.D.   On: 05/26/2018 07:45    Anti-infectives: Anti-infectives (From admission, onward)   Start     Dose/Rate Route Frequency Ordered Stop   05/26/18 0600  ceFEPIme (MAXIPIME) 2 g in sodium chloride 0.9 % 100 mL IVPB  Status:  Discontinued     2 g 200 mL/hr over 30 Minutes Intravenous Every 24 hours 05/25/18 0755 05/25/18 1005   05/25/18 1800  vancomycin (VANCOCIN) 1,250 mg in sodium chloride 0.9 % 250 mL IVPB  Status:  Discontinued     1,250 mg 166.7 mL/hr over 90 Minutes Intravenous Every 24 hours 05/25/18 0755 05/25/18 1005   05/25/18 1200  Ampicillin-Sulbactam (UNASYN) 3 g in sodium chloride 0.9 % 100 mL IVPB     3 g 200 mL/hr over 30 Minutes Intravenous Every 8 hours 05/25/18 1010  05/25/18 0500  ceFEPIme (MAXIPIME) 1 g in sodium chloride 0.9 % 100 mL IVPB  Status:  Discontinued     1 g 200 mL/hr over 30 Minutes Intravenous Every 8 hours 05/25/18 0024 05/25/18 0755   05/25/18 0030  ceFEPIme (MAXIPIME) 1 g in sodium chloride 0.9 % 100 mL IVPB  Status:  Discontinued     1 g 200 mL/hr over 30 Minutes Intravenous Every 8 hours 05/25/18 0024 05/25/18 0024   05/24/18 2100  ceFEPIme (MAXIPIME) 2 g in sodium chloride 0.9 % 100 mL IVPB     2 g 200 mL/hr over 30 Minutes Intravenous   Once 05/24/18 2049 05/24/18 2235   05/24/18 2100  vancomycin (VANCOCIN) IVPB 1000 mg/200 mL premix     1,000 mg 200 mL/hr over 60 Minutes Intravenous  Once 05/24/18 2049 05/24/18 2341      Assessment/Plan: Angela M Loganis a 84 y.o.femalewith pSBO versus ileus of the bowel related to her pneumonia.  -Made NPO while NG in place so we can get a more accurate reading of her output from NG -Had a lunch tray at bedside -Keep K to 4, Phos 3, and Mg replaced to 2 -Patient is poor operative candidate given her COPD/ PNA, and prior history of Takotsubo cardiomyopathy EF 35% reported in 2016, which is usually seen in times of stress. Repeat ECHO in 02/2018 with normalization of the EF but still concerning history.   -Would continue conservative management and if ultimately comes to needing a surgery, will likely need to go to [facility] for better monitoring; 24/7 cardiology care     LOS: 3 days    Virl Cagey 05/27/2018

## 2018-05-28 ENCOUNTER — Encounter (HOSPITAL_COMMUNITY): Payer: Self-pay

## 2018-05-28 DIAGNOSIS — K529 Noninfective gastroenteritis and colitis, unspecified: Secondary | ICD-10-CM

## 2018-05-28 LAB — GLUCOSE, CAPILLARY
Glucose-Capillary: 116 mg/dL — ABNORMAL HIGH (ref 65–99)
Glucose-Capillary: 121 mg/dL — ABNORMAL HIGH (ref 65–99)
Glucose-Capillary: 124 mg/dL — ABNORMAL HIGH (ref 65–99)
Glucose-Capillary: 140 mg/dL — ABNORMAL HIGH (ref 65–99)
Glucose-Capillary: 145 mg/dL — ABNORMAL HIGH (ref 65–99)

## 2018-05-28 LAB — COMPREHENSIVE METABOLIC PANEL
ALT: 8 U/L — ABNORMAL LOW (ref 14–54)
AST: 10 U/L — ABNORMAL LOW (ref 15–41)
Albumin: 2.6 g/dL — ABNORMAL LOW (ref 3.5–5.0)
Alkaline Phosphatase: 67 U/L (ref 38–126)
Anion gap: 11 (ref 5–15)
BUN: 16 mg/dL (ref 6–20)
CO2: 28 mmol/L (ref 22–32)
Calcium: 9.2 mg/dL (ref 8.9–10.3)
Chloride: 101 mmol/L (ref 101–111)
Creatinine, Ser: 1.02 mg/dL — ABNORMAL HIGH (ref 0.44–1.00)
GFR calc Af Amer: 58 mL/min — ABNORMAL LOW (ref >60)
GFR calc non Af Amer: 50 mL/min — ABNORMAL LOW (ref >60)
Glucose, Bld: 136 mg/dL — ABNORMAL HIGH (ref 65–99)
Potassium: 4.1 mmol/L (ref 3.5–5.1)
Sodium: 140 mmol/L (ref 135–145)
Total Bilirubin: 0.7 mg/dL (ref 0.3–1.2)
Total Protein: 6.4 g/dL — ABNORMAL LOW (ref 6.5–8.1)

## 2018-05-28 LAB — CBC WITH DIFFERENTIAL/PLATELET
Basophils Absolute: 0 10*3/uL (ref 0.0–0.1)
Basophils Relative: 0 %
Eosinophils Absolute: 0.1 10*3/uL (ref 0.0–0.7)
Eosinophils Relative: 1 %
HCT: 36 % (ref 36.0–46.0)
Hemoglobin: 11 g/dL — ABNORMAL LOW (ref 12.0–15.0)
Lymphocytes Relative: 6 %
Lymphs Abs: 1.3 10*3/uL (ref 0.7–4.0)
MCH: 26.5 pg (ref 26.0–34.0)
MCHC: 30.6 g/dL (ref 30.0–36.0)
MCV: 86.7 fL (ref 78.0–100.0)
Monocytes Absolute: 2.4 10*3/uL — ABNORMAL HIGH (ref 0.1–1.0)
Monocytes Relative: 11 %
Neutro Abs: 18.2 10*3/uL — ABNORMAL HIGH (ref 1.7–7.7)
Neutrophils Relative %: 82 %
Platelets: 191 10*3/uL (ref 150–400)
RBC: 4.15 MIL/uL (ref 3.87–5.11)
RDW: 15.6 % — ABNORMAL HIGH (ref 11.5–15.5)
WBC: 22 10*3/uL — ABNORMAL HIGH (ref 4.0–10.5)

## 2018-05-28 LAB — MAGNESIUM: Magnesium: 2.2 mg/dL (ref 1.7–2.4)

## 2018-05-28 MED ORDER — METRONIDAZOLE IN NACL 5-0.79 MG/ML-% IV SOLN
500.0000 mg | Freq: Three times a day (TID) | INTRAVENOUS | Status: AC
  Administered 2018-05-28 – 2018-06-02 (×17): 500 mg via INTRAVENOUS
  Filled 2018-05-28 (×14): qty 100

## 2018-05-28 NOTE — Progress Notes (Addendum)
[facility] Progress Note     Subjective: No major complaints. Says she did not ambulate yesterday, slight less distended. Pain about the same in the suprapubic area.   Reviewed CT with Dr. Thornton Papas today. Some degree of enteritis in the small bowel and this could be causing the obstruction. No real adhesive type obstruction picture appreciated. Bowel thickened and dilated with transition to normal caliber distal.     Objective: Vital signs in last 24 hours: Temp:  [98.8 F (37.1 C)-99.1 F (37.3 C)] 98.8 F (37.1 C) (06/21 0623) Pulse Rate:  [97-111] 102 (06/21 0623) Resp:  [14-20] 20 (06/21 0623) BP: (117-167)/(43-94) 133/48 (06/21 0623) SpO2:  [91 %-99 %] 99 % (06/21 0759) Last BM Date: 05/27/18  Intake/Output from previous day: 06/20 0701 - 06/21 0700 In: 936.7 [P.O.:420; IV Piggyback:516.7] Out: 650 [Urine:650] Intake/Output this shift: Total I/O In: 300 [NG/GT:300] Out: -   General appearance: alert, cooperative and no distress Resp: noraml work breathing GI: soft, distended, tender in the suprapubic area, no rebound or guarding  Lab Results:  Recent Labs    05/27/18 0654 05/28/18 0450  WBC 21.5* 22.0*  HGB 11.0* 11.0*  HCT 35.4* 36.0  PLT 179 191   BMET Recent Labs    05/27/18 0654 05/28/18 0450  NA 137 140  K 4.1 4.1  CL 99* 101  CO2 29 28  GLUCOSE 142* 136*  BUN 15 16  CREATININE 0.99 1.02*  CALCIUM 9.0 9.2    Studies/Results: Dg Chest Port 1 View  Result Date: 05/26/2018 CLINICAL DATA:  NG tube placement. EXAM: PORTABLE CHEST 1 VIEW COMPARISON:  Chest x-ray from yesterday. FINDINGS: The patient is rotated to the left. Interval placement of an NG tube seen entering the stomach, with the tip below the field of view. Unchanged right chest wall port catheter. Stable cardiomegaly and pulmonary vascular congestion. Low lung volumes with increased right basilar atelectasis and unchanged left lower lobe opacity. No pleural effusion or  pneumothorax. No acute osseous abnormality. IMPRESSION: 1. NG tube entering the stomach with tip below the field of view. 2. Unchanged left lower lobe airspace disease versus post radiation treatment changes. 3. Stable mild pulmonary vascular congestion. Electronically Signed   By: Titus Dubin M.D.   On: 05/26/2018 12:42    Anti-infectives: Anti-infectives (From admission, onward)   Start     Dose/Rate Route Frequency Ordered Stop   05/26/18 0600  ceFEPIme (MAXIPIME) 2 g in sodium chloride 0.9 % 100 mL IVPB  Status:  Discontinued     2 g 200 mL/hr over 30 Minutes Intravenous Every 24 hours 05/25/18 0755 05/25/18 1005   05/25/18 1800  vancomycin (VANCOCIN) 1,250 mg in sodium chloride 0.9 % 250 mL IVPB  Status:  Discontinued     1,250 mg 166.7 mL/hr over 90 Minutes Intravenous Every 24 hours 05/25/18 0755 05/25/18 1005   05/25/18 1200  Ampicillin-Sulbactam (UNASYN) 3 g in sodium chloride 0.9 % 100 mL IVPB     3 g 200 mL/hr over 30 Minutes Intravenous Every 8 hours 05/25/18 1010     05/25/18 0500  ceFEPIme (MAXIPIME) 1 g in sodium chloride 0.9 % 100 mL IVPB  Status:  Discontinued     1 g 200 mL/hr over 30 Minutes Intravenous Every 8 hours 05/25/18 0024 05/25/18 0755   05/25/18 0030  ceFEPIme (MAXIPIME) 1 g in sodium chloride 0.9 % 100 mL IVPB  Status:  Discontinued     1 g 200 mL/hr over 30 Minutes Intravenous  Every 8 hours 05/25/18 0024 05/25/18 0024   05/24/18 2100  ceFEPIme (MAXIPIME) 2 g in sodium chloride 0.9 % 100 mL IVPB     2 g 200 mL/hr over 30 Minutes Intravenous  Once 05/24/18 2049 05/24/18 2235   05/24/18 2100  vancomycin (VANCOCIN) IVPB 1000 mg/200 mL premix     1,000 mg 200 mL/hr over 60 Minutes Intravenous  Once 05/24/18 2049 05/24/18 2341      Assessment/Plan: Angela Lawson a 84 y.o.femaleSBO potentially from enteritis based on review of scan with Dr. Thornton Papas. Also could be some degree of ileus related to her pneumonia. Do not think this is likely adhesive based on  the enteritis findings.   -Made NPO, NG -Keep K to 4, Phos 3, and Mg replaced to 2 -Patient is poor operative candidate given her COPD/ PNA, and prior history of Takotsubo cardiomyopathy EF 35% reported in 2016, which is usually seen in times of stress. Repeat ECHO in 02/2018 with normalization of the EF but still concerning history.   -Would continue conservative management  -Discussed with Dr. Daleen Bo, will add some flagyl to cover for enteritis too    LOS: 4 days    Virl Cagey 05/28/2018

## 2018-05-28 NOTE — Progress Notes (Signed)
[facility] PROGRESS NOTE  Angela Lawson JJH:[id number] DOB: 01-06-1934 DOA: 05/24/2018 PCP: Rosita Fire, MD  Brief summary   84 year old female with a history of lung cancer coronary artery disease, diastolic heart failure, COPD, presents to the hospital with complaints of worsening shortness of breath as well as abdominal pain, vomiting and distention.  Found to have pneumonia as well as bowel obstruction.  Started on intravenous antibiotics.  General surgery following and NG tube placed for decompression.  Assessment/Plan:  Pneumonia.  Currently on intravenous antibiotics with Unasyn.  Vancomycin discontinued as MRSA screen was negative.  Respiratory status appears to be stabilizing. Blood culture: NGTD. Monitor   Elevated troponin.  Likely demand ischemia from acute illness.  Echocardiogram does not show any wall motion abnormalities.  Cardiology is seeing the patient.  No plans for further ischemic work-up at this time.  Small bowel obstruction versus ileus.  General surgery following.  NG tube in place.  Had small bowel movement this morning, but mostly mucus per staff.  Continue supportive measures.  Parkinson's disease.  Patient is on Sinemet.  Hyponatremia.  Possibly related to dehydration.  Improved with hydration.  Chronic diastolic CHF.  Was given 1 dose of IV Lasix on 6/18.  Respiratory status currently stable.  Diabetes.  Currently on Lantus.  Blood sugar stable.  Added sliding scale.  Chronic kidney disease stage III.  Creatinine currently at baseline.  Continue to monitor.  Stage IV lung cancer.  Patient is being treated at the cancer center and plans to resume treatment when medically stable.    Code Status: full Family Communication: d/w patient, RN (indicate person spoken with, relationship, and if by phone, the number) Disposition Plan: remains inpatient    Consultants:   Cardiology  General surgery  Procedures:  Echocardiogram: - Left  ventricle: The cavity size was normal. Systolic function was vigorous. The estimated ejection fraction was in the range of 65% to 70%. Wall motion was normal; there were no regional wall motion abnormalities. Doppler parameters are consistent with abnormal left ventricular relaxation (grade 1 diastolic dysfunction). Doppler parameters are consistent with high ventricular filling pressure. Mild concentric and moderate focal basal septal hypertrophy. - Aortic valve: There was trivial regurgitation. - Mitral valve: Mildly calcified annulus. - Pericardium, extracardiac: Trivial posterior pericardial  effusion.  Antimicrobials:   Ampicillin 6/18 >      HPI/Subjective: Alert, reports mild abdominal pains, had flatus. No BM yet  Objective: Vitals:   05/28/18 0623 05/28/18 0759  BP: (!) 133/48   Pulse: (!) 102   Resp: 20   Temp: 98.8 F (37.1 C)   SpO2: 97% 99%    Intake/Output Summary (Last 24 hours) at 05/28/2018 1003 Last data filed at 05/28/2018 0725 Gross per 24 hour  Intake 1236.67 ml  Output 650 ml  Net 586.67 ml   Filed Weights   05/24/18 2312 05/25/18 0500 05/27/18 0500  Weight: 79.2 kg (174 lb 9.7 oz) 97.2 kg (214 lb 4.6 oz) 76.5 kg (168 lb 10.4 oz)    Exam:   General:  No distress   Cardiovascular: s1,s2 rrr  Respiratory: no wheezing   Abdomen: soft, mild tender.   Musculoskeletal: no leg edema    Data Reviewed: Basic Metabolic Panel: Recent Labs  Lab 05/24/18 1824 05/25/18 0404 05/26/18 0627 05/27/18 0654 05/28/18 0450  NA 132* 130* 134* 137 140  K 4.5 4.2 4.2 4.1 4.1  CL 93* 96* 96* 99* 101  CO2 29 24 26 29 28   GLUCOSE 158*  156* 146* 142* 136*  BUN 12 11 16 15 16   CREATININE 1.16* 1.10* 1.23* 0.99 1.02*  CALCIUM 9.3 9.0 9.0 9.0 9.2  MG  --   --  1.8 2.1 2.2  PHOS  --   --  3.5  --   --    Liver Function Tests: Recent Labs  Lab 05/22/18 1555 05/24/18 1824 05/26/18 0627 05/27/18 0654 05/28/18 0450  AST 16 14*  11* 9* 10*  ALT <5* 14 11* 9* 8*  ALKPHOS 59 66 60 64 67  BILITOT 0.7 1.0 1.1 0.7 0.7  PROT 7.4 7.2 6.5 6.5 6.4*  ALBUMIN 3.9 3.5 2.8* 2.7* 2.6*   Recent Labs  Lab 05/22/18 1555 05/24/18 1824  LIPASE 50 24   No results for input(s): AMMONIA in the last 168 hours. CBC: Recent Labs  Lab 05/22/18 1606 05/24/18 1824 05/25/18 0404 05/26/18 0627 05/27/18 0654 05/28/18 0450  WBC 11.2* 21.6* 20.1* 18.0* 21.5* 22.0*  NEUTROABS 8.0*  --  16.9* 15.1* 18.2* 18.2*  HGB 12.0 12.1 12.3 12.1 11.0* 11.0*  HCT 40.3 38.8 39.0 39.2 35.4* 36.0  MCV 88.0 85.7 85.7 85.6 86.3 86.7  PLT 203 178 188 177 179 191   Cardiac Enzymes: Recent Labs  Lab 05/24/18 1824 05/24/18 2158 05/25/18 0404 05/25/18 1219  TROPONINI 0.28* 0.25* 0.28* 0.28*   BNP (last 3 results) Recent Labs    06/22/17 2331 02/04/18 1610 02/05/18 2211  BNP 43.0 70.0 230.0*    ProBNP (last 3 results) No results for input(s): PROBNP in the last 8760 hours.  CBG: Recent Labs  Lab 05/27/18 1535 05/27/18 2015 05/28/18 0001 05/28/18 0409 05/28/18 0742  GLUCAP 172* 126* 140* 145* 116*    Recent Results (from the past 240 hour(s))  Culture, blood (routine x 2)     Status: None (Preliminary result)   Collection Time: 05/24/18  9:49 PM  Result Value Ref Range Status   Specimen Description BLOOD LEFT HAND  Final   Special Requests   Final    BOTTLES DRAWN AEROBIC ONLY Blood Culture adequate volume   Culture   Final    NO GROWTH 4 DAYS Performed at [facility], [address] [place], [place] [address]    Report Status PENDING  Incomplete  Culture, blood (routine x 2)     Status: None (Preliminary result)   Collection Time: 05/24/18  9:58 PM  Result Value Ref Range Status   Specimen Description BLOOD LEFT HAND  Final   Special Requests   Final    BOTTLES DRAWN AEROBIC ONLY Blood Culture adequate volume   Culture   Final    NO GROWTH 4 DAYS Performed at [facility], [address], [place], [place] [address]     Report Status PENDING  Incomplete  Urine culture     Status: Abnormal   Collection Time: 05/24/18 10:22 PM  Result Value Ref Range Status   Specimen Description   Final    URINE, CLEAN CATCH Performed at [facility], [address], [place], [place] [address]    Special Requests   Final    Normal Performed at [facility], [address], [place], [place] [address]    Culture MULTIPLE SPECIES PRESENT, SUGGEST RECOLLECTION (A)  Final   Report Status 05/26/2018 FINAL  Final  MRSA PCR Screening     Status: None   Collection Time: 05/24/18 11:10 PM  Result Value Ref Range Status   MRSA by PCR NEGATIVE NEGATIVE Final    Comment:  The GeneXpert MRSA Assay (FDA approved for NASAL specimens only), is one component of a comprehensive MRSA colonization surveillance program. It is not intended to diagnose MRSA infection nor to guide or monitor treatment for MRSA infections. Performed at [facility], [address] [place], [place] [address]      Studies: Dg Chest [facility] 1 View  Result Date: 05/26/2018 CLINICAL DATA:  NG tube placement. EXAM: PORTABLE CHEST 1 VIEW COMPARISON:  Chest x-ray from yesterday. FINDINGS: The patient is rotated to the left. Interval placement of an NG tube seen entering the stomach, with the tip below the field of view. Unchanged right chest wall port catheter. Stable cardiomegaly and pulmonary vascular congestion. Low lung volumes with increased right basilar atelectasis and unchanged left lower lobe opacity. No pleural effusion or pneumothorax. No acute osseous abnormality. IMPRESSION: 1. NG tube entering the stomach with tip below the field of view. 2. Unchanged left lower lobe airspace disease versus post radiation treatment changes. 3. Stable mild pulmonary vascular congestion. Electronically Signed   By: Titus Dubin M.D.   On: 05/26/2018 12:42    Scheduled Meds: . aspirin EC  81 mg Oral Daily  . atorvastatin  40 mg Oral QHS  . bisacodyl  10 mg  Rectal Daily  . enoxaparin (LOVENOX) injection  40 mg Subcutaneous Q24H  . feeding supplement  1 Container Oral TID BM  . insulin aspart  0-9 Units Subcutaneous Q4H  . insulin glargine  15 Units Subcutaneous QHS  . ipratropium-albuterol  3 mL Nebulization Q6H  . losartan  50 mg Oral Daily  . metoprolol succinate  25 mg Oral Daily   Continuous Infusions: . ampicillin-sulbactam (UNASYN) IV Stopped (05/28/18 0446)    Principal Problem:   HCAP (healthcare-associated pneumonia) Active Problems:   Elevated troponin   CKD (chronic kidney disease) stage 3, GFR 30-59 ml/min (HCC)   Essential hypertension   Type II diabetes mellitus (HCC)   Chronic diastolic CHF (congestive heart failure) (HCC)   CAD (coronary artery disease)   Parkinson's disease (HCC)   Hyponatremia   Abdominal distention   Intestinal occlusion (HCC)   Partial small bowel obstruction ([facility])    Time spent: >35 minutes     Kinnie Feil  [facility] Pager [phone]. If 7PM-7AM, please contact night-coverage at [email or web address], password [email or web address] 05/28/2018, 10:03 AM  LOS: 4 days

## 2018-05-28 NOTE — Care Management Important Message (Signed)
Important Message  Patient Details  Name: Angela Lawson MRN: [id number] Date of Birth: October 05, 1934   Medicare Important Message Given:  Yes    [name] 05/28/2018, 10:35 AM

## 2018-05-29 ENCOUNTER — Inpatient Hospital Stay (HOSPITAL_COMMUNITY): Payer: Medicare Other

## 2018-05-29 DIAGNOSIS — K56609 Unspecified intestinal obstruction, unspecified as to partial versus complete obstruction: Secondary | ICD-10-CM

## 2018-05-29 LAB — COMPREHENSIVE METABOLIC PANEL
ALT: 8 U/L — ABNORMAL LOW (ref 14–54)
ALT: 9 U/L — ABNORMAL LOW (ref 14–54)
AST: 11 U/L — ABNORMAL LOW (ref 15–41)
AST: 13 U/L — ABNORMAL LOW (ref 15–41)
Albumin: 2.4 g/dL — ABNORMAL LOW (ref 3.5–5.0)
Albumin: 2.6 g/dL — ABNORMAL LOW (ref 3.5–5.0)
Alkaline Phosphatase: 68 U/L (ref 38–126)
Alkaline Phosphatase: 72 U/L (ref 38–126)
Anion gap: 12 (ref 5–15)
Anion gap: 11 (ref 5–15)
BUN: 19 mg/dL (ref 6–20)
BUN: 21 mg/dL — ABNORMAL HIGH (ref 6–20)
CO2: 29 mmol/L (ref 22–32)
CO2: 27 mmol/L (ref 22–32)
Calcium: 9.2 mg/dL (ref 8.9–10.3)
Calcium: 9.1 mg/dL (ref 8.9–10.3)
Chloride: 103 mmol/L (ref 101–111)
Chloride: 105 mmol/L (ref 101–111)
Creatinine, Ser: 1.05 mg/dL — ABNORMAL HIGH (ref 0.44–1.00)
Creatinine, Ser: 1 mg/dL (ref 0.44–1.00)
GFR calc Af Amer: 56 mL/min — ABNORMAL LOW (ref >60)
GFR calc Af Amer: 59 mL/min — ABNORMAL LOW (ref >60)
GFR calc non Af Amer: 48 mL/min — ABNORMAL LOW (ref >60)
GFR calc non Af Amer: 51 mL/min — ABNORMAL LOW (ref >60)
Glucose, Bld: 127 mg/dL — ABNORMAL HIGH (ref 65–99)
Glucose, Bld: 126 mg/dL — ABNORMAL HIGH (ref 65–99)
Potassium: 3.8 mmol/L (ref 3.5–5.1)
Potassium: 4 mmol/L (ref 3.5–5.1)
Sodium: 144 mmol/L (ref 135–145)
Sodium: 143 mmol/L (ref 135–145)
Total Bilirubin: 0.5 mg/dL (ref 0.3–1.2)
Total Bilirubin: 0.7 mg/dL (ref 0.3–1.2)
Total Protein: 6.3 g/dL — ABNORMAL LOW (ref 6.5–8.1)
Total Protein: 6.5 g/dL (ref 6.5–8.1)

## 2018-05-29 LAB — CULTURE, BLOOD (ROUTINE X 2)
Culture: NO GROWTH
Culture: NO GROWTH
Special Requests: ADEQUATE
Special Requests: ADEQUATE

## 2018-05-29 LAB — GLUCOSE, CAPILLARY
Glucose-Capillary: 101 mg/dL — ABNORMAL HIGH (ref 65–99)
Glucose-Capillary: 104 mg/dL — ABNORMAL HIGH (ref 65–99)
Glucose-Capillary: 105 mg/dL — ABNORMAL HIGH (ref 65–99)
Glucose-Capillary: 110 mg/dL — ABNORMAL HIGH (ref 65–99)
Glucose-Capillary: 123 mg/dL — ABNORMAL HIGH (ref 65–99)
Glucose-Capillary: 127 mg/dL — ABNORMAL HIGH (ref 65–99)
Glucose-Capillary: 86 mg/dL (ref 65–99)

## 2018-05-29 LAB — CBC WITH DIFFERENTIAL/PLATELET
Basophils Absolute: 0 10*3/uL (ref 0.0–0.1)
Basophils Relative: 0 %
Eosinophils Absolute: 0.2 10*3/uL (ref 0.0–0.7)
Eosinophils Relative: 1 %
HCT: 35.9 % — ABNORMAL LOW (ref 36.0–46.0)
Hemoglobin: 10.9 g/dL — ABNORMAL LOW (ref 12.0–15.0)
Lymphocytes Relative: 7 %
Lymphs Abs: 1.4 10*3/uL (ref 0.7–4.0)
MCH: 26.4 pg (ref 26.0–34.0)
MCHC: 30.4 g/dL (ref 30.0–36.0)
MCV: 86.9 fL (ref 78.0–100.0)
Monocytes Absolute: 1.9 10*3/uL — ABNORMAL HIGH (ref 0.1–1.0)
Monocytes Relative: 9 %
Neutro Abs: 16.8 10*3/uL — ABNORMAL HIGH (ref 1.7–7.7)
Neutrophils Relative %: 83 %
Platelets: 207 10*3/uL (ref 150–400)
RBC: 4.13 MIL/uL (ref 3.87–5.11)
RDW: 15.8 % — ABNORMAL HIGH (ref 11.5–15.5)
WBC: 20.3 10*3/uL — ABNORMAL HIGH (ref 4.0–10.5)

## 2018-05-29 LAB — PHOSPHORUS: Phosphorus: 4.3 mg/dL (ref 2.5–4.6)

## 2018-05-29 LAB — MAGNESIUM
Magnesium: 2.3 mg/dL (ref 1.7–2.4)
Magnesium: 2.3 mg/dL (ref 1.7–2.4)

## 2018-05-29 MED ORDER — FENTANYL CITRATE (PF) 100 MCG/2ML IJ SOLN
75.0000 ug | INTRAMUSCULAR | Status: DC | PRN
  Administered 2018-05-29 – 2018-06-04 (×30): 75 ug via INTRAVENOUS
  Filled 2018-05-29 (×30): qty 2

## 2018-05-29 NOTE — Progress Notes (Addendum)
[facility] PROGRESS NOTE  Angela Lawson YQI:[id number] DOB: 1934-09-15 DOA: 05/24/2018 PCP: Rosita Fire, MD  Brief summary   84 year old female with a history of lung cancer coronary artery disease, diastolic heart failure, COPD, presents to the hospital with complaints of worsening shortness of breath as well as abdominal pain, vomiting and distention.  Found to have pneumonia as well as bowel obstruction.  Started on intravenous antibiotics.  General surgery following and NG tube placed for decompression.  Assessment/Plan:  Pneumonia.  Currently on intravenous antibiotics with Unasyn.  Vancomycin discontinued as MRSA screen was negative.  Respiratory status appears to be stabilizing. Blood culture: NGTD. Monitor   Elevated troponin.  Likely demand ischemia from acute illness.  Echocardiogram does not show any wall motion abnormalities.  Cardiology is seeing the patient.  No plans for further ischemic work-up at this time.  Small bowel obstruction versus ileus.  General surgery following.  NG tube in place and draining well.    Still no bowel movement.  No impaction per staff.  Patient has received suppositories x1 she is about to get another one.  If she does not have any bowel movement with the second suppository we will try soapsuds enema. Continue supportive measures.   surgeon  increased her pain medicine to fentanyl every 2 hours I will repeat her abdominal x-ray  Parkinson's disease.  Patient is on Sinemet.  Hyponatremia.  Possibly related to dehydration.  Improved with hydration.  Chronic diastolic CHF.  Was given 1 dose of IV Lasix on 6/18.  Respiratory status currently stable.  Diabetes.  Currently on Lantus.  Blood sugar stable.  Added sliding scale.  Chronic kidney disease stage III.  Creatinine currently at baseline.  Continue to monitor.  Stage IV lung cancer.  Patient is being treated at the cancer center and plans to resume treatment when medically  stable.    Code Status: full Family Communication: Daughter who was at bedside and RN Disposition Plan: remains inpatient    Consultants:   Cardiology  General surgery  Procedures:  Echocardiogram: - Left ventricle: The cavity size was normal. Systolic function was vigorous. The estimated ejection fraction was in the range of 65% to 70%. Wall motion was normal; there were no regional wall motion abnormalities. Doppler parameters are consistent with abnormal left ventricular relaxation (grade 1 diastolic dysfunction). Doppler parameters are consistent with high ventricular filling pressure. Mild concentric and moderate focal basal septal hypertrophy. - Aortic valve: There was trivial regurgitation. - Mitral valve: Mildly calcified annulus. - Pericardium, extracardiac: Trivial posterior pericardial  effusion.  Antimicrobials:   Ampicillin 6/18 >      HPI/Subjective: Alert, reports mild abdominal pains, had flatus. No BM yet  Objective: Vitals:   05/29/18 0946 05/29/18 1446  BP:    Pulse:    Resp:    Temp:    SpO2: 98% 98%    Intake/Output Summary (Last 24 hours) at 05/29/2018 1630 Last data filed at 05/29/2018 1500 Gross per 24 hour  Intake 1095 ml  Output 1200 ml  Net -105 ml   Filed Weights   05/24/18 2312 05/25/18 0500 05/27/18 0500  Weight: 79.2 kg (174 lb 9.7 oz) 97.2 kg (214 lb 4.6 oz) 76.5 kg (168 lb 10.4 oz)    Exam:   General:  No distress   Cardiovascular: s1,s2 rrr  Respiratory: no wheezing   Abdomen: Firm but not rigid, mild tender ,mild minimal bowel sounds ,no gurgling.   Musculoskeletal: no leg edema    Data  Reviewed: Basic Metabolic Panel: Recent Labs  Lab 05/25/18 0404 05/26/18 8676 05/27/18 0654 05/28/18 0450 05/29/18 0641  NA 130* 134* 137 140 144  K 4.2 4.2 4.1 4.1 3.8  CL 96* 96* 99* 101 103  CO2 24 26 29 28 29   GLUCOSE 156* 146* 142* 136* 127*  BUN 11 16 15 16 19   CREATININE 1.10* 1.23*  0.99 1.02* 1.05*  CALCIUM 9.0 9.0 9.0 9.2 9.2  MG  --  1.8 2.1 2.2 2.3  PHOS  --  3.5  --   --   --    Liver Function Tests: Recent Labs  Lab 05/24/18 1824 05/26/18 0627 05/27/18 0654 05/28/18 0450 05/29/18 0641  AST 14* 11* 9* 10* 11*  ALT 14 11* 9* 8* 8*  ALKPHOS 66 60 64 67 68  BILITOT 1.0 1.1 0.7 0.7 0.5  PROT 7.2 6.5 6.5 6.4* 6.3*  ALBUMIN 3.5 2.8* 2.7* 2.6* 2.4*   Recent Labs  Lab 05/24/18 1824  LIPASE 24   No results for input(s): AMMONIA in the last 168 hours. CBC: Recent Labs  Lab 05/25/18 0404 05/26/18 0627 05/27/18 0654 05/28/18 0450 05/29/18 0641  WBC 20.1* 18.0* 21.5* 22.0* 20.3*  NEUTROABS 16.9* 15.1* 18.2* 18.2* 16.8*  HGB 12.3 12.1 11.0* 11.0* 10.9*  HCT 39.0 39.2 35.4* 36.0 35.9*  MCV 85.7 85.6 86.3 86.7 86.9  PLT 188 177 179 191 207   Cardiac Enzymes: Recent Labs  Lab 05/24/18 1824 05/24/18 2158 05/25/18 0404 05/25/18 1219  TROPONINI 0.28* 0.25* 0.28* 0.28*   BNP (last 3 results) Recent Labs    06/22/17 2331 02/04/18 1610 02/05/18 2211  BNP 43.0 70.0 230.0*    ProBNP (last 3 results) No results for input(s): PROBNP in the last 8760 hours.  CBG: Recent Labs  Lab 05/29/18 0021 05/29/18 0433 05/29/18 0727 05/29/18 1146 05/29/18 1615  GLUCAP 110* 127* 105* 101* 104*    Recent Results (from the past 240 hour(s))  Culture, blood (routine x 2)     Status: None   Collection Time: 05/24/18  9:49 PM  Result Value Ref Range Status   Specimen Description BLOOD LEFT HAND  Final   Special Requests   Final    BOTTLES DRAWN AEROBIC ONLY Blood Culture adequate volume   Culture   Final    NO GROWTH 5 DAYS Performed at [facility], [address], [place], [place] [address]    Report Status 05/29/2018 FINAL  Final  Culture, blood (routine x 2)     Status: None   Collection Time: 05/24/18  9:58 PM  Result Value Ref Range Status   Specimen Description BLOOD LEFT HAND  Final   Special Requests   Final    BOTTLES DRAWN AEROBIC ONLY  Blood Culture adequate volume   Culture   Final    NO GROWTH 5 DAYS Performed at [facility], [address] [place], [place] [address]    Report Status 05/29/2018 FINAL  Final  Urine culture     Status: Abnormal   Collection Time: 05/24/18 10:22 PM  Result Value Ref Range Status   Specimen Description   Final    URINE, CLEAN CATCH Performed at [facility], [address], [place], [place] [address]    Special Requests   Final    Normal Performed at [facility], [address], [place], [place] [address]    Culture MULTIPLE SPECIES PRESENT, SUGGEST RECOLLECTION (A)  Final   Report Status 05/26/2018 FINAL  Final  MRSA PCR Screening  Status: None   Collection Time: 05/24/18 11:10 PM  Result Value Ref Range Status   MRSA by PCR NEGATIVE NEGATIVE Final    Comment:        The GeneXpert MRSA Assay (FDA approved for NASAL specimens only), is one component of a comprehensive MRSA colonization surveillance program. It is not intended to diagnose MRSA infection nor to guide or monitor treatment for MRSA infections. Performed at [facility], [address], [place], [place] [address]      Studies: No results found.  Scheduled Meds: . aspirin EC  81 mg Oral Daily  . atorvastatin  40 mg Oral QHS  . bisacodyl  10 mg Rectal Daily  . enoxaparin (LOVENOX) injection  40 mg Subcutaneous Q24H  . feeding supplement  1 Container Oral TID BM  . insulin aspart  0-9 Units Subcutaneous Q4H  . insulin glargine  15 Units Subcutaneous QHS  . ipratropium-albuterol  3 mL Nebulization Q6H  . losartan  50 mg Oral Daily  . metoprolol succinate  25 mg Oral Daily   Continuous Infusions: . ampicillin-sulbactam (UNASYN) IV Stopped (05/29/18 1226)  . metronidazole Stopped (05/29/18 1409)    Principal Problem:   HCAP (healthcare-associated pneumonia) Active Problems:   Elevated troponin   CKD (chronic kidney disease) stage 3, GFR 30-59 ml/min (HCC)   Essential hypertension   Type II  diabetes mellitus (HCC)   Chronic diastolic CHF (congestive heart failure) (HCC)   CAD (coronary artery disease)   Parkinson's disease (HCC)   Hyponatremia   Abdominal distention   SBO (small bowel obstruction) (HCC)   Partial small bowel obstruction (HCC)   Enteritis    Time spent: 25 minutes   Addendum: Patient still complaining of abdominal pain NG tube still draining very well.  She still has not made a bowel movement we will try her on soapsuds enema.  Cristal Deer  [facility] Pager [phone]. If 7PM-7AM, please contact night-coverage at [email or web address], password [facility] 05/29/2018, 4:30 PM  LOS: 5 days

## 2018-05-29 NOTE — Progress Notes (Signed)
  Subjective: Patient still with abdominal pain.  Has not passed flatus yet.  Objective: Vital signs in last 24 hours: Temp:  [98.1 F (36.7 C)-99 F (37.2 C)] 98.1 F (36.7 C) (06/22 0435) Pulse Rate:  [95-102] 98 (06/22 0435) Resp:  [16-18] 16 (06/22 0435) BP: (127-153)/(58-72) 127/58 (06/22 0435) SpO2:  [93 %-98 %] 98 % (06/22 0435) Last BM Date: 05/27/18  Intake/Output from previous day: 06/21 0701 - 06/22 0700 In: 740 [P.O.:240; NG/GT:300; IV Piggyback:200] Out: 600 [Urine:300; Emesis/NG output:300] Intake/Output this shift: No intake/output data recorded.  General appearance: alert, cooperative and no distress GI: Soft with minimal bowel sounds appreciated.  Nonspecific discomfort to palpation noted, though no rigidity.  Lab Results:  Recent Labs    05/28/18 0450 05/29/18 0641  WBC 22.0* 20.3*  HGB 11.0* 10.9*  HCT 36.0 35.9*  PLT 191 207   BMET Recent Labs    05/28/18 0450 05/29/18 0641  NA 140 144  K 4.1 3.8  CL 101 103  CO2 28 29  GLUCOSE 136* 127*  BUN 16 19  CREATININE 1.02* 1.05*  CALCIUM 9.2 9.2   PT/INR No results for input(s): LABPROT, INR in the last 72 hours.  Studies/Results: No results found.  Anti-infectives: Anti-infectives (From admission, onward)   Start     Dose/Rate Route Frequency Ordered Stop   05/28/18 1300  metroNIDAZOLE (FLAGYL) IVPB 500 mg     500 mg 100 mL/hr over 60 Minutes Intravenous Every 8 hours 05/28/18 1208     05/26/18 0600  ceFEPIme (MAXIPIME) 2 g in sodium chloride 0.9 % 100 mL IVPB  Status:  Discontinued     2 g 200 mL/hr over 30 Minutes Intravenous Every 24 hours 05/25/18 0755 05/25/18 1005   05/25/18 1800  vancomycin (VANCOCIN) 1,250 mg in sodium chloride 0.9 % 250 mL IVPB  Status:  Discontinued     1,250 mg 166.7 mL/hr over 90 Minutes Intravenous Every 24 hours 05/25/18 0755 05/25/18 1005   05/25/18 1200  Ampicillin-Sulbactam (UNASYN) 3 g in sodium chloride 0.9 % 100 mL IVPB     3 g 200 mL/hr over 30  Minutes Intravenous Every 8 hours 05/25/18 1010     05/25/18 0500  ceFEPIme (MAXIPIME) 1 g in sodium chloride 0.9 % 100 mL IVPB  Status:  Discontinued     1 g 200 mL/hr over 30 Minutes Intravenous Every 8 hours 05/25/18 0024 05/25/18 0755   05/25/18 0030  ceFEPIme (MAXIPIME) 1 g in sodium chloride 0.9 % 100 mL IVPB  Status:  Discontinued     1 g 200 mL/hr over 30 Minutes Intravenous Every 8 hours 05/25/18 0024 05/25/18 0024   05/24/18 2100  ceFEPIme (MAXIPIME) 2 g in sodium chloride 0.9 % 100 mL IVPB     2 g 200 mL/hr over 30 Minutes Intravenous  Once 05/24/18 2049 05/24/18 2235   05/24/18 2100  vancomycin (VANCOCIN) IVPB 1000 mg/200 mL premix     1,000 mg 200 mL/hr over 60 Minutes Intravenous  Once 05/24/18 2049 05/24/18 2341      Assessment/Plan: Impression: Enteritis with probable bowel ileus.  Leukocytosis still present. Plan: Continue current management.  Will monitor NG tube output over the next 24 hours.  Have increased her fentanyl.  LOS: 5 days    Aviva Signs 05/29/2018

## 2018-05-30 DIAGNOSIS — K529 Noninfective gastroenteritis and colitis, unspecified: Secondary | ICD-10-CM

## 2018-05-30 LAB — GLUCOSE, CAPILLARY
Glucose-Capillary: 74 mg/dL (ref 65–99)
Glucose-Capillary: 82 mg/dL (ref 65–99)
Glucose-Capillary: 85 mg/dL (ref 65–99)
Glucose-Capillary: 93 mg/dL (ref 65–99)
Glucose-Capillary: 94 mg/dL (ref 65–99)

## 2018-05-30 LAB — CBC WITH DIFFERENTIAL/PLATELET
Basophils Absolute: 0 10*3/uL (ref 0.0–0.1)
Basophils Relative: 0 %
Eosinophils Absolute: 0.2 10*3/uL (ref 0.0–0.7)
Eosinophils Relative: 1 %
HCT: 36.2 % (ref 36.0–46.0)
Hemoglobin: 10.8 g/dL — ABNORMAL LOW (ref 12.0–15.0)
Lymphocytes Relative: 8 %
Lymphs Abs: 1.2 10*3/uL (ref 0.7–4.0)
MCH: 26.1 pg (ref 26.0–34.0)
MCHC: 29.8 g/dL — ABNORMAL LOW (ref 30.0–36.0)
MCV: 87.4 fL (ref 78.0–100.0)
Monocytes Absolute: 1.5 10*3/uL — ABNORMAL HIGH (ref 0.1–1.0)
Monocytes Relative: 9 %
Neutro Abs: 12.8 10*3/uL — ABNORMAL HIGH (ref 1.7–7.7)
Neutrophils Relative %: 82 %
Platelets: 246 10*3/uL (ref 150–400)
RBC: 4.14 MIL/uL (ref 3.87–5.11)
RDW: 15.8 % — ABNORMAL HIGH (ref 11.5–15.5)
WBC: 15.7 10*3/uL — ABNORMAL HIGH (ref 4.0–10.5)

## 2018-05-30 MED ORDER — IPRATROPIUM-ALBUTEROL 0.5-2.5 (3) MG/3ML IN SOLN
3.0000 mL | Freq: Three times a day (TID) | RESPIRATORY_TRACT | Status: DC
  Administered 2018-05-31 – 2018-06-04 (×13): 3 mL via RESPIRATORY_TRACT
  Filled 2018-05-30 (×14): qty 3

## 2018-05-30 NOTE — Progress Notes (Signed)
Subjective: Patient's pain is slightly relieved with the increase in fentanyl.  She did have a small bowel movement yesterday.  Objective: Vital signs in last 24 hours: Temp:  [97.5 F (36.4 C)-98.5 F (36.9 C)] 97.5 F (36.4 C) (06/23 0526) Pulse Rate:  [87-100] 87 (06/23 0526) Resp:  [18] 18 (06/23 0526) BP: (145-153)/(67-69) 145/67 (06/23 0526) SpO2:  [95 %-99 %] 98 % (06/23 0739) Last BM Date: 05/29/18  Intake/Output from previous day: 06/22 0701 - 06/23 0700 In: 1431.7 [IV Piggyback:1431.7] Out: 1450 [Urine:300; Emesis/NG output:1150] Intake/Output this shift: Total I/O In: -  Out: 500 [Emesis/NG output:500]  General appearance: alert, cooperative and no distress GI: Soft with no specific point tenderness noted.  No rigidity is noted.  Minimal bowel sounds appreciated.  Lab Results:  Recent Labs    05/29/18 0641 05/30/18 0610  WBC 20.3* 15.7*  HGB 10.9* 10.8*  HCT 35.9* 36.2  PLT 207 246   BMET Recent Labs    05/29/18 0641 05/29/18 2029  NA 144 143  K 3.8 4.0  CL 103 105  CO2 29 27  GLUCOSE 127* 126*  BUN 19 21*  CREATININE 1.05* 1.00  CALCIUM 9.2 9.1   PT/INR No results for input(s): LABPROT, INR in the last 72 hours.  Studies/Results: Dg Abd 1 View  Result Date: 05/29/2018 CLINICAL DATA:  Bowel obstruction EXAM: ABDOMEN - 1 VIEW COMPARISON:  05/26/2018, CT 05/25/2018 FINDINGS: Persistent gaseous enlargement of central small bowel loops, measuring up to 4.3 cm slight increase compared to prior. Contrast material within the right hemiabdomen. Residual dilute contrast within the urinary bladder. Partially visualized esophageal tube tip overlies the gastric fundus with side port projecting over the distal esophagus. IMPRESSION: Slight increased gaseous dilatation of central small bowel loops consistent with bowel obstruction. Side port tip of the esophageal tube projects over distal esophagus, suggest further advancement for more optimal positioning.  Electronically Signed   By: Donavan Foil M.D.   On: 05/29/2018 22:26    Anti-infectives: Anti-infectives (From admission, onward)   Start     Dose/Rate Route Frequency Ordered Stop   05/28/18 1300  metroNIDAZOLE (FLAGYL) IVPB 500 mg     500 mg 100 mL/hr over 60 Minutes Intravenous Every 8 hours 05/28/18 1208     05/26/18 0600  ceFEPIme (MAXIPIME) 2 g in sodium chloride 0.9 % 100 mL IVPB  Status:  Discontinued     2 g 200 mL/hr over 30 Minutes Intravenous Every 24 hours 05/25/18 0755 05/25/18 1005   05/25/18 1800  vancomycin (VANCOCIN) 1,250 mg in sodium chloride 0.9 % 250 mL IVPB  Status:  Discontinued     1,250 mg 166.7 mL/hr over 90 Minutes Intravenous Every 24 hours 05/25/18 0755 05/25/18 1005   05/25/18 1200  Ampicillin-Sulbactam (UNASYN) 3 g in sodium chloride 0.9 % 100 mL IVPB     3 g 200 mL/hr over 30 Minutes Intravenous Every 8 hours 05/25/18 1010     05/25/18 0500  ceFEPIme (MAXIPIME) 1 g in sodium chloride 0.9 % 100 mL IVPB  Status:  Discontinued     1 g 200 mL/hr over 30 Minutes Intravenous Every 8 hours 05/25/18 0024 05/25/18 0755   05/25/18 0030  ceFEPIme (MAXIPIME) 1 g in sodium chloride 0.9 % 100 mL IVPB  Status:  Discontinued     1 g 200 mL/hr over 30 Minutes Intravenous Every 8 hours 05/25/18 0024 05/25/18 0024   05/24/18 2100  ceFEPIme (MAXIPIME) 2 g in sodium chloride 0.9 % 100 mL  IVPB     2 g 200 mL/hr over 30 Minutes Intravenous  Once 05/24/18 2049 05/24/18 2235   05/24/18 2100  vancomycin (VANCOCIN) IVPB 1000 mg/200 mL premix     1,000 mg 200 mL/hr over 60 Minutes Intravenous  Once 05/24/18 2049 05/24/18 2341      Assessment/Plan: Impression: Enteritis with ileus.  Encouraging that her leukocytosis is resolving.  Bowel function is still minimal and she is does still have significant NG tube output. Plan: Continue current management.  LOS: 6 days    Aviva Signs 05/30/2018

## 2018-05-30 NOTE — Progress Notes (Signed)
[facility] PROGRESS NOTE  Angela Lawson YWV:[id number] DOB: [birth year] DOA: 05/24/2018 PCP: Rosita Fire, MD  Brief summary   84 year old female with a history of lung cancer coronary artery disease, diastolic heart failure, COPD, presents to the hospital with complaints of worsening shortness of breath as well as abdominal pain, vomiting and distention.  Found to have pneumonia as well as bowel obstruction.  Started on intravenous antibiotics.  General surgery following and NG tube placed for decompression.  Assessment/Plan:  Pneumonia.  Currently on intravenous antibiotics with Unasyn.  Vancomycin discontinued as MRSA screen was negative.  Respiratory status appears to be stabilizing. Blood culture: NGTD. Monitor.  Leukocytosis improving  Elevated troponin.  Likely demand ischemia from acute illness.  Echocardiogram does not show any wall motion abnormalities.  Cardiology is seeing the patient.  No plans for further ischemic work-up at this time.  Small bowel obstruction versus ileus.  General surgery following.    Patient continues to have significant NG tube output.  Bowel function has not significantly recovered yet.  Continue current management  Parkinson's disease.  Patient is on Sinemet.  Hyponatremia.  Possibly related to dehydration.  Improved with hydration.  Chronic diastolic CHF.  Was given 1 dose of IV Lasix on 6/18.  Respiratory status currently stable.  Diabetes.  Currently on Lantus.  Blood sugar stable.  Added sliding scale.  Chronic kidney disease stage III.  Creatinine currently at baseline.  Continue to monitor.  Stage IV lung cancer.  Patient is being treated at the cancer center and plans to resume treatment when medically stable.    Code Status: full Family Communication: Multiple family members at the bedside Disposition Plan: remains inpatient    Consultants:   Cardiology  General surgery  Procedures:  Echocardiogram: - Left ventricle:  The cavity size was normal. Systolic function was vigorous. The estimated ejection fraction was in the range of 65% to 70%. Wall motion was normal; there were no regional wall motion abnormalities. Doppler parameters are consistent with abnormal left ventricular relaxation (grade 1 diastolic dysfunction). Doppler parameters are consistent with high ventricular filling pressure. Mild concentric and moderate focal basal septal hypertrophy. - Aortic valve: There was trivial regurgitation. - Mitral valve: Mildly calcified annulus. - Pericardium, extracardiac: Trivial posterior pericardial  effusion.  Antimicrobials:   Ampicillin 6/18 >  HPI/Subjective: Had small bowel movement yesterday.  Continues to have NG tube in place.  Objective: Vitals:   05/30/18 1343 05/30/18 1535  BP:  (!) 151/58  Pulse:  90  Resp:  15  Temp:  98.2 F (36.8 C)  SpO2: 97% 95%    Intake/Output Summary (Last 24 hours) at 05/30/2018 1835 Last data filed at 05/30/2018 1613 Gross per 24 hour  Intake 876.66 ml  Output 1550 ml  Net -673.34 ml   Filed Weights   05/24/18 2312 05/25/18 0500 05/27/18 0500  Weight: 79.2 kg (174 lb 9.7 oz) 97.2 kg (214 lb 4.6 oz) 76.5 kg (168 lb 10.4 oz)    Exam:  General exam: Alert, awake, oriented x 3 Respiratory system: Clear to auscultation. Respiratory effort normal. Cardiovascular system:RRR. No murmurs, rubs, gallops. Gastrointestinal system: Abdomen is distended, soft and diffusely tender. No organomegaly or masses felt. Normal bowel sounds heard. Central nervous system: Alert and oriented. No focal neurological deficits. Extremities: No C/C/E, +pedal pulses Skin: No rashes, lesions or ulcers  Psychiatry: Judgement and insight appear normal. Mood & affect appropriate.   Data Reviewed: Basic Metabolic Panel: Recent Labs  Lab 05/26/18 0627 05/27/18  0092 05/28/18 0450 05/29/18 0641 05/29/18 2029  NA 134* 137 140 144 143  K 4.2 4.1 4.1 3.8  4.0  CL 96* 99* 101 103 105  CO2 26 29 28 29 27   GLUCOSE 146* 142* 136* 127* 126*  BUN 16 15 16 19  21*  CREATININE 1.23* 0.99 1.02* 1.05* 1.00  CALCIUM 9.0 9.0 9.2 9.2 9.1  MG 1.8 2.1 2.2 2.3 2.3  PHOS 3.5  --   --   --  4.3   Liver Function Tests: Recent Labs  Lab 05/26/18 0627 05/27/18 0654 05/28/18 0450 05/29/18 0641 05/29/18 2029  AST 11* 9* 10* 11* 13*  ALT 11* 9* 8* 8* 9*  ALKPHOS 60 64 67 68 72  BILITOT 1.1 0.7 0.7 0.5 0.7  PROT 6.5 6.5 6.4* 6.3* 6.5  ALBUMIN 2.8* 2.7* 2.6* 2.4* 2.6*   Recent Labs  Lab 05/24/18 1824  LIPASE 24   No results for input(s): AMMONIA in the last 168 hours. CBC: Recent Labs  Lab 05/26/18 0627 05/27/18 0654 05/28/18 0450 05/29/18 0641 05/30/18 0610  WBC 18.0* 21.5* 22.0* 20.3* 15.7*  NEUTROABS 15.1* 18.2* 18.2* 16.8* 12.8*  HGB 12.1 11.0* 11.0* 10.9* 10.8*  HCT 39.2 35.4* 36.0 35.9* 36.2  MCV 85.6 86.3 86.7 86.9 87.4  PLT 177 179 191 207 246   Cardiac Enzymes: Recent Labs  Lab 05/24/18 1824 05/24/18 2158 05/25/18 0404 05/25/18 1219  TROPONINI 0.28* 0.25* 0.28* 0.28*   BNP (last 3 results) Recent Labs    06/22/17 2331 02/04/18 1610 02/05/18 2211  BNP 43.0 70.0 230.0*    ProBNP (last 3 results) No results for input(s): PROBNP in the last 8760 hours.  CBG: Recent Labs  Lab 05/29/18 2338 05/30/18 0340 05/30/18 0719 05/30/18 1113 05/30/18 1621  GLUCAP 86 74 82 93 85    Recent Results (from the past 240 hour(s))  Culture, blood (routine x 2)     Status: None   Collection Time: 05/24/18  9:49 PM  Result Value Ref Range Status   Specimen Description BLOOD LEFT HAND  Final   Special Requests   Final    BOTTLES DRAWN AEROBIC ONLY Blood Culture adequate volume   Culture   Final    NO GROWTH 5 DAYS Performed at [facility], [address], [place], [place] [address]    Report Status 05/29/2018 FINAL  Final  Culture, blood (routine x 2)     Status: None   Collection Time: 05/24/18  9:58 PM  Result Value  Ref Range Status   Specimen Description BLOOD LEFT HAND  Final   Special Requests   Final    BOTTLES DRAWN AEROBIC ONLY Blood Culture adequate volume   Culture   Final    NO GROWTH 5 DAYS Performed at [facility], [address], [place], [place] [address]    Report Status 05/29/2018 FINAL  Final  Urine culture     Status: Abnormal   Collection Time: 05/24/18 10:22 PM  Result Value Ref Range Status   Specimen Description   Final    URINE, CLEAN CATCH Performed at [facility], [address], [place], [place] [address]    Special Requests   Final    Normal Performed at [facility], [address] [place], [place] [address]    Culture MULTIPLE SPECIES PRESENT, SUGGEST RECOLLECTION (A)  Final   Report Status 05/26/2018 FINAL  Final  MRSA PCR Screening     Status: None   Collection Time: 05/24/18 11:10 PM  Result  Value Ref Range Status   MRSA by PCR NEGATIVE NEGATIVE Final    Comment:        The GeneXpert MRSA Assay (FDA approved for NASAL specimens only), is one component of a comprehensive MRSA colonization surveillance program. It is not intended to diagnose MRSA infection nor to guide or monitor treatment for MRSA infections. Performed at [facility], [address] [place], [place] [address]      Studies: Dg Abd 1 View  Result Date: 05/29/2018 CLINICAL DATA:  Bowel obstruction EXAM: ABDOMEN - 1 VIEW COMPARISON:  05/26/2018, CT 05/25/2018 FINDINGS: Persistent gaseous enlargement of central small bowel loops, measuring up to 4.3 cm slight increase compared to prior. Contrast material within the right hemiabdomen. Residual dilute contrast within the urinary bladder. Partially visualized esophageal tube tip overlies the gastric fundus with side port projecting over the distal esophagus. IMPRESSION: Slight increased gaseous dilatation of central small bowel loops consistent with bowel obstruction. Side port tip of the esophageal tube projects over distal esophagus, suggest  further advancement for more optimal positioning. Electronically Signed   By: Donavan Foil M.D.   On: 05/29/2018 22:26    Scheduled Meds: . aspirin EC  81 mg Oral Daily  . atorvastatin  40 mg Oral QHS  . bisacodyl  10 mg Rectal Daily  . enoxaparin (LOVENOX) injection  40 mg Subcutaneous Q24H  . feeding supplement  1 Container Oral TID BM  . insulin aspart  0-9 Units Subcutaneous Q4H  . insulin glargine  15 Units Subcutaneous QHS  . ipratropium-albuterol  3 mL Nebulization Q6H  . losartan  50 mg Oral Daily  . metoprolol succinate  25 mg Oral Daily   Continuous Infusions: . ampicillin-sulbactam (UNASYN) IV Stopped (05/30/18 1406)  . metronidazole Stopped (05/30/18 1613)    Principal Problem:   HCAP (healthcare-associated pneumonia) Active Problems:   Elevated troponin   CKD (chronic kidney disease) stage 3, GFR 30-59 ml/min (HCC)   Essential hypertension   Type II diabetes mellitus (HCC)   Chronic diastolic CHF (congestive heart failure) (HCC)   CAD (coronary artery disease)   Parkinson's disease ([place])   Hyponatremia   Abdominal distention   SBO (small bowel obstruction) (HCC)   Partial small bowel obstruction ([place])   Enteritis    Time spent: 25 minutes   [employer]  [facility] Pager [phone]. If 7PM-7AM, please contact night-coverage at [email or web address], password [email or web address] 05/30/2018, 6:35 PM  LOS: 6 days

## 2018-05-30 NOTE — Progress Notes (Signed)
NG tube advanced for optimal placement per xray. Continue to monitor.

## 2018-05-31 LAB — CBC WITH DIFFERENTIAL/PLATELET
Basophils Absolute: 0 10*3/uL (ref 0.0–0.1)
Basophils Relative: 0 %
Eosinophils Absolute: 0.2 10*3/uL (ref 0.0–0.7)
Eosinophils Relative: 1 %
HCT: 35.3 % — ABNORMAL LOW (ref 36.0–46.0)
Hemoglobin: 10.6 g/dL — ABNORMAL LOW (ref 12.0–15.0)
Lymphocytes Relative: 11 %
Lymphs Abs: 1.5 10*3/uL (ref 0.7–4.0)
MCH: 26.4 pg (ref 26.0–34.0)
MCHC: 30 g/dL (ref 30.0–36.0)
MCV: 87.8 fL (ref 78.0–100.0)
Monocytes Absolute: 1.4 10*3/uL — ABNORMAL HIGH (ref 0.1–1.0)
Monocytes Relative: 10 %
Neutro Abs: 11.1 10*3/uL — ABNORMAL HIGH (ref 1.7–7.7)
Neutrophils Relative %: 78 %
Platelets: 291 10*3/uL (ref 150–400)
RBC: 4.02 MIL/uL (ref 3.87–5.11)
RDW: 15.9 % — ABNORMAL HIGH (ref 11.5–15.5)
WBC: 14.3 10*3/uL — ABNORMAL HIGH (ref 4.0–10.5)

## 2018-05-31 LAB — GLUCOSE, CAPILLARY
Glucose-Capillary: 110 mg/dL — ABNORMAL HIGH (ref 65–99)
Glucose-Capillary: 141 mg/dL — ABNORMAL HIGH (ref 65–99)
Glucose-Capillary: 146 mg/dL — ABNORMAL HIGH (ref 65–99)
Glucose-Capillary: 160 mg/dL — ABNORMAL HIGH (ref 65–99)
Glucose-Capillary: 170 mg/dL — ABNORMAL HIGH (ref 65–99)
Glucose-Capillary: 80 mg/dL (ref 65–99)
Glucose-Capillary: 81 mg/dL (ref 65–99)
Glucose-Capillary: 92 mg/dL (ref 65–99)

## 2018-05-31 LAB — BASIC METABOLIC PANEL
Anion gap: 11 (ref 5–15)
BUN: 27 mg/dL — ABNORMAL HIGH (ref 6–20)
CO2: 31 mmol/L (ref 22–32)
Calcium: 8.8 mg/dL — ABNORMAL LOW (ref 8.9–10.3)
Chloride: 103 mmol/L (ref 101–111)
Creatinine, Ser: 1.18 mg/dL — ABNORMAL HIGH (ref 0.44–1.00)
GFR calc Af Amer: 48 mL/min — ABNORMAL LOW (ref >60)
GFR calc non Af Amer: 42 mL/min — ABNORMAL LOW (ref >60)
Glucose, Bld: 85 mg/dL (ref 65–99)
Potassium: 3.6 mmol/L (ref 3.5–5.1)
Sodium: 145 mmol/L (ref 135–145)

## 2018-05-31 MED ORDER — INSULIN GLARGINE 100 UNIT/ML ~~LOC~~ SOLN
7.0000 [IU] | Freq: Every day | SUBCUTANEOUS | Status: DC
  Administered 2018-05-31 – 2018-06-03 (×4): 7 [IU] via SUBCUTANEOUS
  Filled 2018-05-31 (×7): qty 0.07

## 2018-05-31 NOTE — Progress Notes (Signed)
[facility] PROGRESS NOTE  Angela Lawson ZOX:[id number] DOB: 1934-08-23 DOA: 05/24/2018 PCP: Rosita Fire, MD  Brief summary   84 year old female with a history of lung cancer coronary artery disease, diastolic heart failure, COPD, presents to the hospital with complaints of worsening shortness of breath as well as abdominal pain, vomiting and distention.  Found to have pneumonia as well as bowel obstruction.  Started on intravenous antibiotics.  General surgery following and NG tube placed for decompression.  Assessment/Plan:  Pneumonia.  Currently on intravenous antibiotics with Unasyn.  Vancomycin discontinued as MRSA screen was negative.  Respiratory status appears to be stabilizing. Blood culture: NGTD. Monitor.  Leukocytosis improving  Elevated troponin.  Likely demand ischemia from acute illness.  Echocardiogram does not show any wall motion abnormalities.  Cardiology is seeing the patient.  No plans for further ischemic work-up at this time.  Small bowel obstruction versus ileus.  General surgery following.    Patient continues to have significant NG tube output.  Bowel function has not significantly recovered yet.  Leukocytosis slowly improving.  She is on Unasyn.  Continue current management  Parkinson's disease.  Patient is on Sinemet.  Hyponatremia.  Possibly related to dehydration.  Improved with hydration.  Chronic diastolic CHF.  Was given 1 dose of IV Lasix on 6/18.  Respiratory status currently stable.  Diabetes.  Currently on Lantus and sliding scale insulin.  Blood sugars are running mildly low.  Will decrease Lantus from 15 units to 7 units daily.  Insulin will probably need further adjustment when she is able to better take p.o.  Chronic kidney disease stage III.  Creatinine currently at baseline.  Continue to monitor.  Stage IV lung cancer.  Patient is being treated at the cancer center and plans to resume treatment when medically stable.    Code Status:  full Family Communication: No family present Disposition Plan: remains inpatient    Consultants:   Cardiology  General surgery  Procedures:  Echocardiogram: - Left ventricle: The cavity size was normal. Systolic function was vigorous. The estimated ejection fraction was in the range of 65% to 70%. Wall motion was normal; there were no regional wall motion abnormalities. Doppler parameters are consistent with abnormal left ventricular relaxation (grade 1 diastolic dysfunction). Doppler parameters are consistent with high ventricular filling pressure. Mild concentric and moderate focal basal septal hypertrophy. - Aortic valve: There was trivial regurgitation. - Mitral valve: Mildly calcified annulus. - Pericardium, extracardiac: Trivial posterior pericardial  effusion.  Antimicrobials:   Ampicillin 6/18 >  HPI/Subjective: Continues to have significant output from NG tube.  She is having some bowel movements and feels that her abdominal discomfort is slowly improving.  Objective: Vitals:   05/31/18 1451 05/31/18 1504  BP:  (!) 137/52  Pulse:  89  Resp:  18  Temp:  98.6 F (37 C)  SpO2: 94% 94%    Intake/Output Summary (Last 24 hours) at 05/31/2018 1513 Last data filed at 05/31/2018 0552 Gross per 24 hour  Intake 448.33 ml  Output 600 ml  Net -151.67 ml   Filed Weights   05/24/18 2312 05/25/18 0500 05/27/18 0500  Weight: 79.2 kg (174 lb 9.7 oz) 97.2 kg (214 lb 4.6 oz) 76.5 kg (168 lb 10.4 oz)    Exam:  General exam: Alert, awake, oriented x 3, NG tube in place Respiratory system: Clear to auscultation. Respiratory effort normal. Cardiovascular system:RRR. No murmurs, rubs, gallops. Gastrointestinal system: Abdomen is distended, diffusely tender. No organomegaly or masses felt. Normal bowel  sounds heard. Central nervous system: Alert and oriented. No focal neurological deficits. Extremities: No C/C/E, +pedal pulses Skin: No rashes, lesions or  ulcers  Psychiatry: Judgement and insight appear normal. Mood & affect appropriate.     Data Reviewed: Basic Metabolic Panel: Recent Labs  Lab 05/26/18 0627 05/27/18 0654 05/28/18 0450 05/29/18 0641 05/29/18 2029 05/31/18 0550  NA 134* 137 140 144 143 145  K 4.2 4.1 4.1 3.8 4.0 3.6  CL 96* 99* 101 103 105 103  CO2 26 29 28 29 27 31   GLUCOSE 146* 142* 136* 127* 126* 85  BUN 16 15 16 19  21* 27*  CREATININE 1.23* 0.99 1.02* 1.05* 1.00 1.18*  CALCIUM 9.0 9.0 9.2 9.2 9.1 8.8*  MG 1.8 2.1 2.2 2.3 2.3  --   PHOS 3.5  --   --   --  4.3  --    Liver Function Tests: Recent Labs  Lab 05/26/18 0627 05/27/18 0654 05/28/18 0450 05/29/18 0641 05/29/18 2029  AST 11* 9* 10* 11* 13*  ALT 11* 9* 8* 8* 9*  ALKPHOS 60 64 67 68 72  BILITOT 1.1 0.7 0.7 0.5 0.7  PROT 6.5 6.5 6.4* 6.3* 6.5  ALBUMIN 2.8* 2.7* 2.6* 2.4* 2.6*   Recent Labs  Lab 05/24/18 1824  LIPASE 24   No results for input(s): AMMONIA in the last 168 hours. CBC: Recent Labs  Lab 05/27/18 0654 05/28/18 0450 05/29/18 0641 05/30/18 0610 05/31/18 0550  WBC 21.5* 22.0* 20.3* 15.7* 14.3*  NEUTROABS 18.2* 18.2* 16.8* 12.8* 11.1*  HGB 11.0* 11.0* 10.9* 10.8* 10.6*  HCT 35.4* 36.0 35.9* 36.2 35.3*  MCV 86.3 86.7 86.9 87.4 87.8  PLT 179 191 207 246 291   Cardiac Enzymes: Recent Labs  Lab 05/24/18 1824 05/24/18 2158 05/25/18 0404 05/25/18 1219  TROPONINI 0.28* 0.25* 0.28* 0.28*   BNP (last 3 results) Recent Labs    06/22/17 2331 02/04/18 1610 02/05/18 2211  BNP 43.0 70.0 230.0*    ProBNP (last 3 results) No results for input(s): PROBNP in the last 8760 hours.  CBG: Recent Labs  Lab 05/30/18 2011 05/30/18 2332 05/31/18 0338 05/31/18 0736 05/31/18 1120  GLUCAP 92 94 80 81 146*    Recent Results (from the past 240 hour(s))  Culture, blood (routine x 2)     Status: None   Collection Time: 05/24/18  9:49 PM  Result Value Ref Range Status   Specimen Description BLOOD LEFT HAND  Final   Special  Requests   Final    BOTTLES DRAWN AEROBIC ONLY Blood Culture adequate volume   Culture   Final    NO GROWTH 5 DAYS Performed at [facility], [address] [place], [place] [address]    Report Status 05/29/2018 FINAL  Final  Culture, blood (routine x 2)     Status: None   Collection Time: 05/24/18  9:58 PM  Result Value Ref Range Status   Specimen Description BLOOD LEFT HAND  Final   Special Requests   Final    BOTTLES DRAWN AEROBIC ONLY Blood Culture adequate volume   Culture   Final    NO GROWTH 5 DAYS Performed at [facility], [address], [place], [place] [address]    Report Status 05/29/2018 FINAL  Final  Urine culture     Status: Abnormal   Collection Time: 05/24/18 10:22 PM  Result Value Ref Range Status   Specimen Description   Final    URINE, CLEAN CATCH Performed at [facility], [place]  [address] [place], [place] [address]    Special Requests   Final    Normal Performed at [facility], [address] [place], [place] [address]    Culture MULTIPLE SPECIES PRESENT, SUGGEST RECOLLECTION (A)  Final   Report Status 05/26/2018 FINAL  Final  MRSA PCR Screening     Status: None   Collection Time: 05/24/18 11:10 PM  Result Value Ref Range Status   MRSA by PCR NEGATIVE NEGATIVE Final    Comment:        The GeneXpert MRSA Assay (FDA approved for NASAL specimens only), is one component of a comprehensive MRSA colonization surveillance program. It is not intended to diagnose MRSA infection nor to guide or monitor treatment for MRSA infections. Performed at [facility], [address], [place], [place] [address]      Studies: Dg Abd 1 View  Result Date: 05/29/2018 CLINICAL DATA:  Bowel obstruction EXAM: ABDOMEN - 1 VIEW COMPARISON:  05/26/2018, CT 05/25/2018 FINDINGS: Persistent gaseous enlargement of central small bowel loops, measuring up to 4.3 cm slight increase compared to prior. Contrast material within the right hemiabdomen. Residual dilute contrast within  the urinary bladder. Partially visualized esophageal tube tip overlies the gastric fundus with side port projecting over the distal esophagus. IMPRESSION: Slight increased gaseous dilatation of central small bowel loops consistent with bowel obstruction. Side port tip of the esophageal tube projects over distal esophagus, suggest further advancement for more optimal positioning. Electronically Signed   By: Donavan Foil M.D.   On: 05/29/2018 22:26    Scheduled Meds: . aspirin EC  81 mg Oral Daily  . atorvastatin  40 mg Oral QHS  . bisacodyl  10 mg Rectal Daily  . enoxaparin (LOVENOX) injection  40 mg Subcutaneous Q24H  . feeding supplement  1 Container Oral TID BM  . insulin aspart  0-9 Units Subcutaneous Q4H  . insulin glargine  15 Units Subcutaneous QHS  . ipratropium-albuterol  3 mL Nebulization TID  . losartan  50 mg Oral Daily  . metoprolol succinate  25 mg Oral Daily   Continuous Infusions: . ampicillin-sulbactam (UNASYN) IV Stopped (05/31/18 0402)  . metronidazole Stopped (05/31/18 8921)    Principal Problem:   HCAP (healthcare-associated pneumonia) Active Problems:   Elevated troponin   CKD (chronic kidney disease) stage 3, GFR 30-59 ml/min (HCC)   Essential hypertension   Type II diabetes mellitus (HCC)   Chronic diastolic CHF (congestive heart failure) (HCC)   CAD (coronary artery disease)   Parkinson's disease ([place])   Hyponatremia   Abdominal distention   SBO (small bowel obstruction) (HCC)   Partial small bowel obstruction ([place])   Enteritis    Time spent: 25 minutes   [employer]  [facility] Pager [phone]. If 7PM-7AM, please contact night-coverage at [email or web address], password [facility] 05/31/2018, 3:13 PM  LOS: 7 days

## 2018-05-31 NOTE — Progress Notes (Signed)
Subjective: Patient states her abdominal pain has eased.  She had another bowel movement.  Objective: Vital signs in last 24 hours: Temp:  [97.6 F (36.4 C)-98.4 F (36.9 C)] 98.4 F (36.9 C) (06/24 0558) Pulse Rate:  [88-91] 88 (06/24 0558) Resp:  [15-16] 16 (06/24 0558) BP: (141-151)/(55-58) 150/55 (06/24 0558) SpO2:  [92 %-98 %] 98 % (06/24 0558) Last BM Date: 05/29/18  Intake/Output from previous day: 06/23 0701 - 06/24 0700 In: 648.3 [IV Piggyback:648.3] Out: 1100 [Emesis/NG output:1100] Intake/Output this shift: No intake/output data recorded.  General appearance: alert, cooperative and no distress GI: Soft, less distended.  Bowel sounds present.  No tenderness noted.  No rigidity noted.  Lab Results:  Recent Labs    05/30/18 0610 05/31/18 0550  WBC 15.7* 14.3*  HGB 10.8* 10.6*  HCT 36.2 35.3*  PLT 246 291   BMET Recent Labs    05/29/18 2029 05/31/18 0550  NA 143 145  K 4.0 3.6  CL 105 103  CO2 27 31  GLUCOSE 126* 85  BUN 21* 27*  CREATININE 1.00 1.18*  CALCIUM 9.1 8.8*   PT/INR No results for input(s): LABPROT, INR in the last 72 hours.  Studies/Results: Dg Abd 1 View  Result Date: 05/29/2018 CLINICAL DATA:  Bowel obstruction EXAM: ABDOMEN - 1 VIEW COMPARISON:  05/26/2018, CT 05/25/2018 FINDINGS: Persistent gaseous enlargement of central small bowel loops, measuring up to 4.3 cm slight increase compared to prior. Contrast material within the right hemiabdomen. Residual dilute contrast within the urinary bladder. Partially visualized esophageal tube tip overlies the gastric fundus with side port projecting over the distal esophagus. IMPRESSION: Slight increased gaseous dilatation of central small bowel loops consistent with bowel obstruction. Side port tip of the esophageal tube projects over distal esophagus, suggest further advancement for more optimal positioning. Electronically Signed   By: Donavan Foil M.D.   On: 05/29/2018 22:26     Anti-infectives: Anti-infectives (From admission, onward)   Start     Dose/Rate Route Frequency Ordered Stop   05/28/18 1300  metroNIDAZOLE (FLAGYL) IVPB 500 mg     500 mg 100 mL/hr over 60 Minutes Intravenous Every 8 hours 05/28/18 1208     05/26/18 0600  ceFEPIme (MAXIPIME) 2 g in sodium chloride 0.9 % 100 mL IVPB  Status:  Discontinued     2 g 200 mL/hr over 30 Minutes Intravenous Every 24 hours 05/25/18 0755 05/25/18 1005   05/25/18 1800  vancomycin (VANCOCIN) 1,250 mg in sodium chloride 0.9 % 250 mL IVPB  Status:  Discontinued     1,250 mg 166.7 mL/hr over 90 Minutes Intravenous Every 24 hours 05/25/18 0755 05/25/18 1005   05/25/18 1200  Ampicillin-Sulbactam (UNASYN) 3 g in sodium chloride 0.9 % 100 mL IVPB     3 g 200 mL/hr over 30 Minutes Intravenous Every 8 hours 05/25/18 1010     05/25/18 0500  ceFEPIme (MAXIPIME) 1 g in sodium chloride 0.9 % 100 mL IVPB  Status:  Discontinued     1 g 200 mL/hr over 30 Minutes Intravenous Every 8 hours 05/25/18 0024 05/25/18 0755   05/25/18 0030  ceFEPIme (MAXIPIME) 1 g in sodium chloride 0.9 % 100 mL IVPB  Status:  Discontinued     1 g 200 mL/hr over 30 Minutes Intravenous Every 8 hours 05/25/18 0024 05/25/18 0024   05/24/18 2100  ceFEPIme (MAXIPIME) 2 g in sodium chloride 0.9 % 100 mL IVPB     2 g 200 mL/hr over 30 Minutes Intravenous  Once 05/24/18 2049 05/24/18 2235   05/24/18 2100  vancomycin (VANCOCIN) IVPB 1000 mg/200 mL premix     1,000 mg 200 mL/hr over 60 Minutes Intravenous  Once 05/24/18 2049 05/24/18 2341      Assessment/Plan: Impression: Enteritis with bowel obstruction, slowly resolving.  A significant amount of NG tube output was noted.  Hopefully, she is turning around the corner and her bowel function is starting to return.  Her leukocytosis continues to improve. Plan: Continue current management.  LOS: 7 days    Aviva Signs 05/31/2018

## 2018-05-31 NOTE — Care Management Important Message (Signed)
Important Message  Patient Details  Name: OSWIN JOHAL MRN: [id number] Date of Birth: [birth year]   Medicare Important Message Given:  Yes    [name] 05/31/2018, 12:25 PM

## 2018-06-01 DIAGNOSIS — R109 Unspecified abdominal pain: Secondary | ICD-10-CM

## 2018-06-01 LAB — BASIC METABOLIC PANEL
Anion gap: 9 (ref 5–15)
BUN: 23 mg/dL (ref 8–23)
CO2: 33 mmol/L — ABNORMAL HIGH (ref 22–32)
Calcium: 8.9 mg/dL (ref 8.9–10.3)
Chloride: 106 mmol/L (ref 98–111)
Creatinine, Ser: 1.12 mg/dL — ABNORMAL HIGH (ref 0.44–1.00)
GFR calc Af Amer: 52 mL/min — ABNORMAL LOW (ref >60)
GFR calc non Af Amer: 44 mL/min — ABNORMAL LOW (ref >60)
Glucose, Bld: 103 mg/dL — ABNORMAL HIGH (ref 70–99)
Potassium: 3.9 mmol/L (ref 3.5–5.1)
Sodium: 148 mmol/L — ABNORMAL HIGH (ref 135–145)

## 2018-06-01 LAB — CBC
HCT: 35.8 % — ABNORMAL LOW (ref 36.0–46.0)
Hemoglobin: 10.9 g/dL — ABNORMAL LOW (ref 12.0–15.0)
MCH: 26.7 pg (ref 26.0–34.0)
MCHC: 30.4 g/dL (ref 30.0–36.0)
MCV: 87.5 fL (ref 78.0–100.0)
Platelets: 316 10*3/uL (ref 150–400)
RBC: 4.09 MIL/uL (ref 3.87–5.11)
RDW: 15.8 % — ABNORMAL HIGH (ref 11.5–15.5)
WBC: 12.8 10*3/uL — ABNORMAL HIGH (ref 4.0–10.5)

## 2018-06-01 LAB — GLUCOSE, CAPILLARY
Glucose-Capillary: 90 mg/dL (ref 70–99)
Glucose-Capillary: 101 mg/dL — ABNORMAL HIGH (ref 70–99)
Glucose-Capillary: 231 mg/dL — ABNORMAL HIGH (ref 70–99)
Glucose-Capillary: 123 mg/dL — ABNORMAL HIGH (ref 70–99)
Glucose-Capillary: 123 mg/dL — ABNORMAL HIGH (ref 70–99)

## 2018-06-01 NOTE — Progress Notes (Signed)
Schorr, NP notified of patient's BP 120/39, HR 91. No response yet. Will continue to monitor.

## 2018-06-01 NOTE — Progress Notes (Signed)
[facility] PROGRESS NOTE  Angela Lawson OEU:[id number] DOB: 02-03-1934 DOA: 05/24/2018 PCP: Rosita Fire, MD  Brief summary   84 year old female with a history of lung cancer coronary artery disease, diastolic heart failure, COPD, presents to the hospital with complaints of worsening shortness of breath as well as abdominal pain, vomiting and distention.  Found to have pneumonia as well as bowel obstruction.  Started on intravenous antibiotics.  General surgery following and NG tube placed for decompression.  Assessment/Plan:  1. Pneumonia.  Currently on intravenous antibiotics with Unasyn.  Vancomycin discontinued as MRSA screen was negative.  Respiratory status appears to be stabilizing. Blood culture: NGTD. Monitor.  Leukocytosis improving 2. Elevated troponin.  Likely demand ischemia from acute illness.  Echocardiogram does not show any wall motion abnormalities.  Cardiology is seeing the patient.  No plans for further ischemic work-up at this time. 3. Small bowel obstruction versus ileus.  General surgery following.   NG removed and clears started 6/25.  Leukocytosis slowly improving.  She is on Unasyn.  Continue current management 4. Parkinson's disease.  Patient is on Sinemet. 5. Hyponatremia.  Possibly related to dehydration.  Improved with hydration. 6. Chronic diastolic CHF.  Was given 1 dose of IV Lasix on 6/18.  Respiratory status currently stable. 7. Diabetes.  Currently on Lantus and sliding scale insulin.  Blood sugars are running mildly low.  Will decrease Lantus from 15 units to 7 units daily.  Insulin will probably need further adjustment when she is able to better take p.o. 8. Chronic kidney disease stage III.  Creatinine currently at baseline.  Continue to monitor. 9. Stage IV lung cancer.  Patient is being treated at the cancer center and plans to resume treatment when medically stable.  Code Status: full Family Communication: No family present Disposition Plan:  remains inpatient    Consultants:   Cardiology  General surgery  Procedures:  Echocardiogram: - Left ventricle: The cavity size was normal. Systolic function was vigorous. The estimated ejection fraction was in the range of 65% to 70%. Wall motion was normal; there were no regional wall motion abnormalities. Doppler parameters are consistent with abnormal left ventricular relaxation (grade 1 diastolic dysfunction). Doppler parameters are consistent with high ventricular filling pressure. Mild concentric and moderate focal basal septal hypertrophy. - Aortic valve: There was trivial regurgitation. - Mitral valve: Mildly calcified annulus. - Pericardium, extracardiac: Trivial posterior pericardial  effusion.  Antimicrobials:   Ampicillin 6/18 >  HPI/Subjective: Pt relieved as NG was removed this morning. Awaiting to try clears. No complaints.  Objective: Vitals:   06/01/18 0743 06/01/18 0945  BP:  127/64  Pulse:  91  Resp:  16  Temp:    SpO2: 94% 99%    Intake/Output Summary (Last 24 hours) at 06/01/2018 1232 Last data filed at 06/01/2018 0827 Gross per 24 hour  Intake 1349.34 ml  Output 400 ml  Net 949.34 ml   Filed Weights   05/24/18 2312 05/25/18 0500 05/27/18 0500  Weight: 79.2 kg (174 lb 9.7 oz) 97.2 kg (214 lb 4.6 oz) 76.5 kg (168 lb 10.4 oz)    Exam:  General exam: Alert, awake, oriented x 3, NG tube in place Respiratory system: Clear to auscultation. Respiratory effort normal. Cardiovascular system:normal s1,s2 sounds. No murmurs, rubs, gallops. Gastrointestinal system: Abdomen is distended, nontender. No organomegaly or masses felt. Normal bowel sounds heard. Central nervous system: Alert and oriented. No focal neurological deficits. Extremities: No C/C/E, +pedal pulses Skin: No rashes, lesions or ulcers  Psychiatry:  Judgement and insight appear normal. Mood & affect appropriate.   Data Reviewed: Basic Metabolic Panel: Recent  Labs  Lab 05/26/18 0627 05/27/18 0654 05/28/18 0450 05/29/18 0641 05/29/18 2029 05/31/18 0550 06/01/18 0505  NA 134* 137 140 144 143 145 148*  K 4.2 4.1 4.1 3.8 4.0 3.6 3.9  CL 96* 99* 101 103 105 103 106  CO2 26 29 28 29 27 31  33*  GLUCOSE 146* 142* 136* 127* 126* 85 103*  BUN 16 15 16 19  21* 27* 23  CREATININE 1.23* 0.99 1.02* 1.05* 1.00 1.18* 1.12*  CALCIUM 9.0 9.0 9.2 9.2 9.1 8.8* 8.9  MG 1.8 2.1 2.2 2.3 2.3  --   --   PHOS 3.5  --   --   --  4.3  --   --    Liver Function Tests: Recent Labs  Lab 05/26/18 0627 05/27/18 0654 05/28/18 0450 05/29/18 0641 05/29/18 2029  AST 11* 9* 10* 11* 13*  ALT 11* 9* 8* 8* 9*  ALKPHOS 60 64 67 68 72  BILITOT 1.1 0.7 0.7 0.5 0.7  PROT 6.5 6.5 6.4* 6.3* 6.5  ALBUMIN 2.8* 2.7* 2.6* 2.4* 2.6*   No results for input(s): LIPASE, AMYLASE in the last 168 hours. No results for input(s): AMMONIA in the last 168 hours. CBC: Recent Labs  Lab 05/27/18 0654 05/28/18 0450 05/29/18 0641 05/30/18 0610 05/31/18 0550 06/01/18 0505  WBC 21.5* 22.0* 20.3* 15.7* 14.3* 12.8*  NEUTROABS 18.2* 18.2* 16.8* 12.8* 11.1*  --   HGB 11.0* 11.0* 10.9* 10.8* 10.6* 10.9*  HCT 35.4* 36.0 35.9* 36.2 35.3* 35.8*  MCV 86.3 86.7 86.9 87.4 87.8 87.5  PLT 179 191 207 246 291 316   Cardiac Enzymes: No results for input(s): CKTOTAL, CKMB, CKMBINDEX, TROPONINI in the last 168 hours. BNP (last 3 results) Recent Labs    06/22/17 2331 02/04/18 1610 02/05/18 2211  BNP 43.0 70.0 230.0*    ProBNP (last 3 results) No results for input(s): PROBNP in the last 8760 hours.  CBG: Recent Labs  Lab 05/31/18 1959 05/31/18 2350 06/01/18 0408 06/01/18 0746 06/01/18 1209  GLUCAP 141* 170* 90 101* 231*    Recent Results (from the past 240 hour(s))  Culture, blood (routine x 2)     Status: None   Collection Time: 05/24/18  9:49 PM  Result Value Ref Range Status   Specimen Description BLOOD LEFT HAND  Final   Special Requests   Final    BOTTLES DRAWN AEROBIC  ONLY Blood Culture adequate volume   Culture   Final    NO GROWTH 5 DAYS Performed at [facility], [address] [place], [place] [address]    Report Status 05/29/2018 FINAL  Final  Culture, blood (routine x 2)     Status: None   Collection Time: 05/24/18  9:58 PM  Result Value Ref Range Status   Specimen Description BLOOD LEFT HAND  Final   Special Requests   Final    BOTTLES DRAWN AEROBIC ONLY Blood Culture adequate volume   Culture   Final    NO GROWTH 5 DAYS Performed at [facility], [address], [place], [place] [address]    Report Status 05/29/2018 FINAL  Final  Urine culture     Status: Abnormal   Collection Time: 05/24/18 10:22 PM  Result Value Ref Range Status   Specimen Description   Final    URINE, CLEAN CATCH Performed at [facility], [address], [place], [place] [address]    Special  Requests   Final    Normal Performed at [facility], [address], [place], [place] [address]    Culture MULTIPLE SPECIES PRESENT, SUGGEST RECOLLECTION (A)  Final   Report Status 05/26/2018 FINAL  Final  MRSA PCR Screening     Status: None   Collection Time: 05/24/18 11:10 PM  Result Value Ref Range Status   MRSA by PCR NEGATIVE NEGATIVE Final    Comment:        The GeneXpert MRSA Assay (FDA approved for NASAL specimens only), is one component of a comprehensive MRSA colonization surveillance program. It is not intended to diagnose MRSA infection nor to guide or monitor treatment for MRSA infections. Performed at [name], [address], [place], [place] [address]      Studies: No results found.  Scheduled Meds: . aspirin EC  81 mg Oral Daily  . atorvastatin  40 mg Oral QHS  . bisacodyl  10 mg Rectal Daily  . enoxaparin (LOVENOX) injection  40 mg Subcutaneous Q24H  . feeding supplement  1 Container Oral TID BM  . insulin aspart  0-9 Units Subcutaneous Q4H  . insulin glargine  7 Units Subcutaneous QHS  . ipratropium-albuterol  3 mL Nebulization TID  .  losartan  50 mg Oral Daily  . metoprolol succinate  25 mg Oral Daily   Continuous Infusions: . ampicillin-sulbactam (UNASYN) IV Stopped (06/01/18 1203)  . metronidazole 500 mg (06/01/18 1219)    Principal Problem:   HCAP (healthcare-associated pneumonia) Active Problems:   Elevated troponin   CKD (chronic kidney disease) stage 3, GFR 30-59 ml/min (HCC)   Essential hypertension   Type II diabetes mellitus (HCC)   Chronic diastolic CHF (congestive heart failure) (HCC)   CAD (coronary artery disease)   Parkinson's disease ([place])   Hyponatremia   Abdominal distention   SBO (small bowel obstruction) (HCC)   Partial small bowel obstruction ([place])   Enteritis  Time spent: 25 minutes   Georgio Hattabaugh [employer] [phone]. If 7PM-7AM, please contact night-coverage at [email or web address], password [facility] 06/01/2018, 12:32 PM  LOS: 8 days

## 2018-06-01 NOTE — Progress Notes (Signed)
  Subjective: Patient feels better.  Is passing gas.  Less abdominal pain noted.  Objective: Vital signs in last 24 hours: Temp:  [98.2 F (36.8 C)-98.7 F (37.1 C)] 98.7 F (37.1 C) (06/25 0657) Pulse Rate:  [85-90] 85 (06/25 0657) Resp:  [18] 18 (06/24 1940) BP: (134-151)/(52-64) 151/64 (06/25 0657) SpO2:  [92 %-95 %] 94 % (06/25 0743) Last BM Date: 05/31/18  Intake/Output from previous day: 06/24 0701 - 06/25 0700 In: 1353.3 [P.O.:600; NG/GT:100; IV Piggyback:653.3] Out: 400 [Emesis/NG output:400] Intake/Output this shift: No intake/output data recorded.  General appearance: alert, cooperative and no distress GI: Soft with minimal tenderness noted to palpation.  No rigidity noted.  Occasional bowel sounds appreciated.  Lab Results:  Recent Labs    05/31/18 0550 06/01/18 0505  WBC 14.3* 12.8*  HGB 10.6* 10.9*  HCT 35.3* 35.8*  PLT 291 316   BMET Recent Labs    05/31/18 0550 06/01/18 0505  NA 145 148*  K 3.6 3.9  CL 103 106  CO2 31 33*  GLUCOSE 85 103*  BUN 27* 23  CREATININE 1.18* 1.12*  CALCIUM 8.8* 8.9   PT/INR No results for input(s): LABPROT, INR in the last 72 hours.  Studies/Results: No results found.  Anti-infectives: Anti-infectives (From admission, onward)   Start     Dose/Rate Route Frequency Ordered Stop   05/28/18 1300  metroNIDAZOLE (FLAGYL) IVPB 500 mg     500 mg 100 mL/hr over 60 Minutes Intravenous Every 8 hours 05/28/18 1208     05/26/18 0600  ceFEPIme (MAXIPIME) 2 g in sodium chloride 0.9 % 100 mL IVPB  Status:  Discontinued     2 g 200 mL/hr over 30 Minutes Intravenous Every 24 hours 05/25/18 0755 05/25/18 1005   05/25/18 1800  vancomycin (VANCOCIN) 1,250 mg in sodium chloride 0.9 % 250 mL IVPB  Status:  Discontinued     1,250 mg 166.7 mL/hr over 90 Minutes Intravenous Every 24 hours 05/25/18 0755 05/25/18 1005   05/25/18 1200  Ampicillin-Sulbactam (UNASYN) 3 g in sodium chloride 0.9 % 100 mL IVPB     3 g 200 mL/hr over 30  Minutes Intravenous Every 8 hours 05/25/18 1010     05/25/18 0500  ceFEPIme (MAXIPIME) 1 g in sodium chloride 0.9 % 100 mL IVPB  Status:  Discontinued     1 g 200 mL/hr over 30 Minutes Intravenous Every 8 hours 05/25/18 0024 05/25/18 0755   05/25/18 0030  ceFEPIme (MAXIPIME) 1 g in sodium chloride 0.9 % 100 mL IVPB  Status:  Discontinued     1 g 200 mL/hr over 30 Minutes Intravenous Every 8 hours 05/25/18 0024 05/25/18 0024   05/24/18 2100  ceFEPIme (MAXIPIME) 2 g in sodium chloride 0.9 % 100 mL IVPB     2 g 200 mL/hr over 30 Minutes Intravenous  Once 05/24/18 2049 05/24/18 2235   05/24/18 2100  vancomycin (VANCOCIN) IVPB 1000 mg/200 mL premix     1,000 mg 200 mL/hr over 60 Minutes Intravenous  Once 05/24/18 2049 05/24/18 2341      Assessment/Plan: Impression: Enteritis with ileus, resolving.  Leukocytosis improving.  NG tube output has decreased. Plan: Will remove NG tube.  Start clear liquid diet.  Would start ambulating patient.  May advance diet as tolerated.  LOS: 8 days    Aviva Signs 06/01/2018

## 2018-06-01 NOTE — Care Management Note (Signed)
Case Management Note  Patient Details  Name: Angela Lawson MRN: [id number] Date of Birth: 1934/11/10   If discussed at Long Length of Stay Meetings, dates discussed:  06/01/2018 Additional Comments:  Desirey Keahey, Chauncey Reading, RN 06/01/2018, 12:15 PM

## 2018-06-02 ENCOUNTER — Inpatient Hospital Stay (HOSPITAL_COMMUNITY): Payer: Medicare Other

## 2018-06-02 LAB — GLUCOSE, CAPILLARY
Glucose-Capillary: 102 mg/dL — ABNORMAL HIGH (ref 70–99)
Glucose-Capillary: 121 mg/dL — ABNORMAL HIGH (ref 70–99)
Glucose-Capillary: 133 mg/dL — ABNORMAL HIGH (ref 70–99)
Glucose-Capillary: 162 mg/dL — ABNORMAL HIGH (ref 70–99)
Glucose-Capillary: 217 mg/dL — ABNORMAL HIGH (ref 70–99)
Glucose-Capillary: 218 mg/dL — ABNORMAL HIGH (ref 70–99)

## 2018-06-02 LAB — CBC
HCT: 35.1 % — ABNORMAL LOW (ref 36.0–46.0)
Hemoglobin: 10.5 g/dL — ABNORMAL LOW (ref 12.0–15.0)
MCH: 26 pg (ref 26.0–34.0)
MCHC: 29.9 g/dL — ABNORMAL LOW (ref 30.0–36.0)
MCV: 86.9 fL (ref 78.0–100.0)
Platelets: 305 10*3/uL (ref 150–400)
RBC: 4.04 MIL/uL (ref 3.87–5.11)
RDW: 16 % — ABNORMAL HIGH (ref 11.5–15.5)
WBC: 17.3 10*3/uL — ABNORMAL HIGH (ref 4.0–10.5)

## 2018-06-02 LAB — BASIC METABOLIC PANEL
Anion gap: 10 (ref 5–15)
BUN: 16 mg/dL (ref 8–23)
CO2: 27 mmol/L (ref 22–32)
Calcium: 8.3 mg/dL — ABNORMAL LOW (ref 8.9–10.3)
Chloride: 104 mmol/L (ref 98–111)
Creatinine, Ser: 1.07 mg/dL — ABNORMAL HIGH (ref 0.44–1.00)
GFR calc Af Amer: 54 mL/min — ABNORMAL LOW (ref >60)
GFR calc non Af Amer: 47 mL/min — ABNORMAL LOW (ref >60)
Glucose, Bld: 116 mg/dL — ABNORMAL HIGH (ref 70–99)
Potassium: 3.1 mmol/L — ABNORMAL LOW (ref 3.5–5.1)
Sodium: 141 mmol/L (ref 135–145)

## 2018-06-02 LAB — MAGNESIUM: Magnesium: 1.7 mg/dL (ref 1.7–2.4)

## 2018-06-02 MED ORDER — POTASSIUM CHLORIDE CRYS ER 20 MEQ PO TBCR
60.0000 meq | EXTENDED_RELEASE_TABLET | Freq: Once | ORAL | Status: AC
  Administered 2018-06-02: 60 meq via ORAL
  Filled 2018-06-02 (×2): qty 3

## 2018-06-02 MED ORDER — MAGNESIUM SULFATE 2 GM/50ML IV SOLN
2.0000 g | Freq: Once | INTRAVENOUS | Status: AC
  Administered 2018-06-02: 2 g via INTRAVENOUS
  Filled 2018-06-02: qty 50

## 2018-06-02 MED ORDER — AMOXICILLIN-POT CLAVULANATE 875-125 MG PO TABS
1.0000 | ORAL_TABLET | Freq: Two times a day (BID) | ORAL | Status: DC
  Administered 2018-06-03 – 2018-06-04 (×3): 1 via ORAL
  Filled 2018-06-02 (×3): qty 1

## 2018-06-02 MED ORDER — ORAL CARE MOUTH RINSE
15.0000 mL | Freq: Two times a day (BID) | OROMUCOSAL | Status: DC
  Administered 2018-06-02 – 2018-06-04 (×3): 15 mL via OROMUCOSAL

## 2018-06-02 MED ORDER — ZOLPIDEM TARTRATE 5 MG PO TABS
5.0000 mg | ORAL_TABLET | Freq: Every evening | ORAL | Status: DC | PRN
  Administered 2018-06-02 – 2018-06-03 (×2): 5 mg via ORAL
  Filled 2018-06-02 (×2): qty 1

## 2018-06-02 NOTE — Care Management Note (Signed)
Case Management Note  Patient Details  Name: Angela Lawson MRN: [id number] Date of Birth: 01-Aug-1934  Subjective/Objective:  HCAP, ileus.     From home with daughter. Walks with RW. Has home continuous oxygen ([employer]). Has had [facility] in the past. Recommended for [facility] PT.  Agreeable to having [facility] RN and PT again.   Has PCP-daughter drives her to appoinments.          Action/Plan: Juliann Pulse of [facility] notified and will obtain orders from [place]. Has portable tank for transport home.   Expected Discharge Date:    06/03/2018              Expected Discharge Plan:  [place]  In-House Referral:     Discharge planning Services  CM Consult  Post Acute Care Choice:    Choice offered to:  Patient  DME Arranged:    DME Agency:     HH Arranged:  RN, PT [place] Agency:  [place]  Status of Service:  Completed, signed off  If discussed at [place] of Stay Meetings, dates discussed:    Additional Comments:  Delayne Sanzo, Chauncey Reading, RN 06/02/2018, 2:07 PM

## 2018-06-02 NOTE — Plan of Care (Signed)
  Problem: Nutrition: Goal: Adequate nutrition will be maintained Outcome: Progressing   Problem: Elimination: Goal: Will not experience complications related to bowel motility Outcome: Progressing Goal: Will not experience complications related to urinary retention Outcome: Progressing   Problem: Pain Managment: Goal: General experience of comfort will improve Outcome: Progressing   Problem: Activity: Goal: Capacity to carry out activities will improve Outcome: Progressing

## 2018-06-02 NOTE — Evaluation (Signed)
Physical Therapy Evaluation Patient Details Name: Angela Lawson MRN: [id number] DOB: 02-Aug-1934 Today's Date: 06/02/2018   History of Present Illness  84 year old female with a history of lung cancer coronary artery disease, diastolic heart failure, COPD, presents to the hospital 05/24/18 with complaints of worsening shortness of breath as well as abdominal pain, vomiting and distention.  Found to have pneumonia as well as bowel obstruction  Clinical Impression  *The patient was determined to ambulate to BR, required extra time, moving slowly and resting grequently. Patient remained on 3 L [place] with O2 saturation 97% and HR 111 after mobilizing . Patient prefers DC to home. Pt admitted with above diagnosis. Pt currently with functional limitations due to the deficits listed below (see PT Problem List).  Pt will benefit from skilled PT to increase their independence and safety with mobility to allow discharge to the venue listed below.       Follow Up Recommendations Home health PT;Supervision/Assistance - 24 hour    Equipment Recommendations  None recommended by PT    Recommendations for Other Services       Precautions / Restrictions Precautions Precautions: Fall Precaution Comments: on 3 L O2      Mobility  Bed Mobility Overal bed mobility: Needs Assistance Bed Mobility: Supine to Sit     Supine to sit: Min assist;HOB elevated     General bed mobility comments: gentle assist with 1 hand to pull up to sitting on bedside  Transfers Overall transfer level: Needs assistance Equipment used: Rolling walker (2 wheeled) Transfers: Sit to/from Stand Sit to Stand: Mod assist         General transfer comment: mod assist to power up to stand from bed raised and from toilet with rail. extra time, requires frequent rest breaks  Ambulation/Gait Ambulation/Gait assistance: Mod assist Gait Distance (Feet): 20 Feet(x 2) Assistive device: Rolling walker (2 wheeled) Gait  Pattern/deviations: Step-to pattern;Trunk flexed;Decreased step length - left;Decreased step length - right Gait velocity: slow   General Gait Details: very slow  speed,  requires assist and prompting to turn RW, move away from door frame, assist to turn RW. patient moves very slowly.  Stairs            Wheelchair Mobility    Modified Rankin (Stroke Patients Only)       Balance Overall balance assessment: Needs assistance Sitting-balance support: Feet supported;No upper extremity supported Sitting balance-Leahy Scale: Fair     Standing balance support: During functional activity;Bilateral upper extremity supported Standing balance-Leahy Scale: Fair Standing balance comment: relies on arm support                             Pertinent Vitals/Pain Pain Assessment: 0-10 Pain Score: 9  Pain Location: abdomen Pain Descriptors / Indicators: Cramping Pain Intervention(s): RN gave pain meds during session;Monitored during session;Limited activity within patient's tolerance    Home Living Family/patient expects to be discharged to:: Private residence Living Arrangements: Children Available Help at Discharge: Family;Available 24 hours/day Type of Home: House Home Access: Ramped entrance     Home Layout: One level Home Equipment: Walker - 4 wheels;Wheelchair - Sport and exercise psychologist Comments: Home O2 dependent,  3 LPM    Prior Function Level of Independence: Independent with assistive device(s)         Comments: daughter and grandaughter assist as needed     Hand Dominance        Extremity/Trunk Assessment   Upper  Extremity Assessment Upper Extremity Assessment: Generalized weakness    Lower Extremity Assessment Lower Extremity Assessment: Generalized weakness    Cervical / Trunk Assessment Cervical / Trunk Assessment: Normal  Communication   Communication: No difficulties  Cognition Arousal/Alertness: Awake/alert Behavior During  Therapy: WFL for tasks assessed/performed Overall Cognitive Status: Within Functional Limits for tasks assessed                                        General Comments      Exercises     Assessment/Plan    PT Assessment Patient needs continued PT services  PT Problem List Decreased strength;Decreased activity tolerance;Decreased knowledge of precautions;Decreased mobility       PT Treatment Interventions DME instruction;Gait training;Functional mobility training;Therapeutic activities;Patient/family education    PT Goals (Current goals can be found in the Care Plan section)  Acute Rehab PT Goals Patient Stated Goal: to go home PT Goal Formulation: With patient Time For Goal Achievement: 06/16/18 Potential to Achieve Goals: Good    Frequency Min 3X/week   Barriers to discharge        Co-evaluation               AM-PAC PT "6 Clicks" Daily Activity  Outcome Measure Difficulty turning over in bed (including adjusting bedclothes, sheets and blankets)?: A Lot Difficulty moving from lying on back to sitting on the side of the bed? : A Lot Difficulty sitting down on and standing up from a chair with arms (e.g., wheelchair, bedside commode, etc,.)?: Unable Help needed moving to and from a bed to chair (including a wheelchair)?: Total Help needed walking in hospital room?: Total Help needed climbing 3-5 steps with a railing? : Total 6 Click Score: 8    End of Session Equipment Utilized During Treatment: Gait belt Activity Tolerance: Patient limited by fatigue Patient left: in chair;with call bell/phone within reach;with chair alarm set Nurse Communication: Mobility status PT Visit Diagnosis: Unsteadiness on feet (R26.81);Pain    Time: 1140-1230 PT Time Calculation (min) (ACUTE ONLY): 50 min   Charges:   PT Evaluation $PT Eval Moderate Complexity: 1 Mod PT Treatments $Gait Training: 8-22 mins $Self Care/Home Management: 8-22   PT G Codes:         Tresa Endo PT [phone]    Claretha Cooper 06/02/2018, 1:05 PM

## 2018-06-02 NOTE — Progress Notes (Signed)
[facility] PROGRESS NOTE  Angela Lawson [name]:[id number] DOB: 08-13-1934 DOA: 05/24/2018 PCP: Rosita Fire, MD  Brief summary    84 year old female with a history of lung cancer coronary artery disease, diastolic heart failure, COPD, presents to the hospital with complaints of worsening shortness of breath as well as abdominal pain, vomiting and distention.  Found to have pneumonia as well as bowel obstruction.  Started on intravenous antibiotics.  General surgery following and NG tube placed for decompression.  Assessment/Plan:  1. Aspiration Pneumonia.  Currently on intravenous antibiotics with Unasyn.  Vancomycin discontinued as MRSA screen was negative.  Respiratory status appears to be stabilizing. Blood culture: NGTD. Monitor.  Repeat CXR today.  2. Elevated troponin.  Likely demand ischemia from acute illness.  Echocardiogram does not show any wall motion abnormalities.  Cardiology is seeing the patient.  No plans for further ischemic work-up at this time. 3. Small bowel obstruction versus ileus.  General surgery following.   NG removed and clears started 6/25.  Leukocytosis slowly improving.  She is on Unasyn.  Continue current management. 4. Leukocytosis - WBC had been trending down but bumped today, check CXR. Repeat in AM.  5. Parkinson's disease.  Patient is on Sinemet.  PT eval requested.  6. Hypernatremia.  resolved.  7. Chronic diastolic CHF.  Was given 1 dose of IV Lasix on 6/18.  Respiratory status currently stable. 8. Type 2  Diabetes.  Currently on Lantus and sliding scale insulin.  Blood sugars stable.  Following.  9. Chronic kidney disease stage III.  Creatinine currently at baseline.  Continue to monitor. 10. Stage IV lung cancer.  Patient is being treated at the cancer center and plans to resume treatment when medically stable.  Code Status: full Family Communication: sister Disposition Plan: remains inpatient   Consultants:   Cardiology  General  surgery  Procedures:  Echocardiogram: - Left ventricle: The cavity size was normal. Systolic function was vigorous. The estimated ejection fraction was in the range of 65% to 70%. Wall motion was normal; there were no regional wall motion abnormalities. Doppler parameters are consistent with abnormal left ventricular relaxation (grade 1 diastolic dysfunction). Doppler parameters are consistent with high ventricular filling pressure. Mild concentric and moderate focal basal septal hypertrophy. - Aortic valve: There was trivial regurgitation. - Mitral valve: Mildly calcified annulus. - Pericardium, extracardiac: Trivial posterior pericardial  effusion.  Antimicrobials:   Ampicillin 6/18 >  HPI/Subjective: Pt had been tolerating diet so far.   Objective: Vitals:   06/02/18 0620 06/02/18 0742  BP: (!) 122/49   Pulse: 76   Resp: 20   Temp: 98.4 F (36.9 C)   SpO2: 100% 98%    Intake/Output Summary (Last 24 hours) at 06/02/2018 0810 Last data filed at 06/02/2018 0630 Gross per 24 hour  Intake 1254.33 ml  Output 400 ml  Net 854.33 ml   Filed Weights   05/24/18 2312 05/25/18 0500 05/27/18 0500  Weight: 79.2 kg (174 lb 9.7 oz) 97.2 kg (214 lb 4.6 oz) 76.5 kg (168 lb 10.4 oz)    Exam:  General exam: Alert, awake, oriented x 3, NG tube in place Respiratory system: Clear to auscultation. Respiratory effort normal. Cardiovascular system:normal s1,s2 sounds. No murmurs, rubs, gallops. Gastrointestinal system: Abdomen is distended, nontender. No organomegaly or masses felt. Normal bowel sounds heard. Central nervous system: Alert and oriented. No focal neurological deficits. Extremities: No C/C/E, +pedal pulses Skin: No rashes, lesions or ulcers  Psychiatry: Judgement and insight appear normal. Mood & affect  appropriate.   Data Reviewed: Basic Metabolic Panel: Recent Labs  Lab 05/27/18 0654 05/28/18 0450 05/29/18 0641 05/29/18 2029 05/31/18 0550  06/01/18 0505 06/02/18 0630  NA 137 140 144 143 145 148* 141  K 4.1 4.1 3.8 4.0 3.6 3.9 3.1*  CL 99* 101 103 105 103 106 104  CO2 29 28 29 27 31  33* 27  GLUCOSE 142* 136* 127* 126* 85 103* 116*  BUN 15 16 19  21* 27* 23 16  CREATININE 0.99 1.02* 1.05* 1.00 1.18* 1.12* 1.07*  CALCIUM 9.0 9.2 9.2 9.1 8.8* 8.9 8.3*  MG 2.1 2.2 2.3 2.3  --   --   --   PHOS  --   --   --  4.3  --   --   --    Liver Function Tests: Recent Labs  Lab 05/27/18 0654 05/28/18 0450 05/29/18 0641 05/29/18 2029  AST 9* 10* 11* 13*  ALT 9* 8* 8* 9*  ALKPHOS 64 67 68 72  BILITOT 0.7 0.7 0.5 0.7  PROT 6.5 6.4* 6.3* 6.5  ALBUMIN 2.7* 2.6* 2.4* 2.6*   No results for input(s): LIPASE, AMYLASE in the last 168 hours. No results for input(s): AMMONIA in the last 168 hours. CBC: Recent Labs  Lab 05/27/18 0654 05/28/18 0450 05/29/18 0641 05/30/18 0610 05/31/18 0550 06/01/18 0505 06/02/18 0630  WBC 21.5* 22.0* 20.3* 15.7* 14.3* 12.8* 17.3*  NEUTROABS 18.2* 18.2* 16.8* 12.8* 11.1*  --   --   HGB 11.0* 11.0* 10.9* 10.8* 10.6* 10.9* 10.5*  HCT 35.4* 36.0 35.9* 36.2 35.3* 35.8* 35.1*  MCV 86.3 86.7 86.9 87.4 87.8 87.5 86.9  PLT 179 191 207 246 291 316 305   Cardiac Enzymes: No results for input(s): CKTOTAL, CKMB, CKMBINDEX, TROPONINI in the last 168 hours. BNP (last 3 results) Recent Labs    06/22/17 2331 02/04/18 1610 02/05/18 2211  BNP 43.0 70.0 230.0*    ProBNP (last 3 results) No results for input(s): PROBNP in the last 8760 hours.  CBG: Recent Labs  Lab 06/01/18 1629 06/01/18 2041 06/02/18 0015 06/02/18 0427 06/02/18 0734  GLUCAP 123* 123* 218* 121* 102*    Recent Results (from the past 240 hour(s))  Culture, blood (routine x 2)     Status: None   Collection Time: 05/24/18  9:49 PM  Result Value Ref Range Status   Specimen Description BLOOD LEFT HAND  Final   Special Requests   Final    BOTTLES DRAWN AEROBIC ONLY Blood Culture adequate volume   Culture   Final    NO GROWTH 5  DAYS Performed at [facility], [address], [place], [place] [address]    Report Status 05/29/2018 FINAL  Final  Culture, blood (routine x 2)     Status: None   Collection Time: 05/24/18  9:58 PM  Result Value Ref Range Status   Specimen Description BLOOD LEFT HAND  Final   Special Requests   Final    BOTTLES DRAWN AEROBIC ONLY Blood Culture adequate volume   Culture   Final    NO GROWTH 5 DAYS Performed at [facility], [address] [place], [place] [address]    Report Status 05/29/2018 FINAL  Final  Urine culture     Status: Abnormal   Collection Time: 05/24/18 10:22 PM  Result Value Ref Range Status   Specimen Description   Final    URINE, CLEAN CATCH Performed at [facility], [address] [place], [place] [address]    Special Requests  Final    Normal Performed at [facility], [address], [place], [place] [address]    Culture MULTIPLE SPECIES PRESENT, SUGGEST RECOLLECTION (A)  Final   Report Status 05/26/2018 FINAL  Final  MRSA PCR Screening     Status: None   Collection Time: 05/24/18 11:10 PM  Result Value Ref Range Status   MRSA by PCR NEGATIVE NEGATIVE Final    Comment:        The GeneXpert MRSA Assay (FDA approved for NASAL specimens only), is one component of a comprehensive MRSA colonization surveillance program. It is not intended to diagnose MRSA infection nor to guide or monitor treatment for MRSA infections. Performed at [facility], [address] [place],  [address]      Studies: No results found.  Scheduled Meds: . aspirin EC  81 mg Oral Daily  . atorvastatin  40 mg Oral QHS  . bisacodyl  10 mg Rectal Daily  . enoxaparin (LOVENOX) injection  40 mg Subcutaneous Q24H  . feeding supplement  1 Container Oral TID BM  . insulin aspart  0-9 Units Subcutaneous Q4H  . insulin glargine  7 Units Subcutaneous QHS  . ipratropium-albuterol  3 mL Nebulization TID  . losartan  50 mg Oral Daily  . mouth rinse  15 mL Mouth Rinse BID  .  metoprolol succinate  25 mg Oral Daily  . potassium chloride  60 mEq Oral Once   Continuous Infusions: . ampicillin-sulbactam (UNASYN) IV Stopped (06/02/18 0530)  . metronidazole Stopped (06/02/18 0630)    Principal Problem:   HCAP (healthcare-associated pneumonia) Active Problems:   Elevated troponin   CKD (chronic kidney disease) stage 3, GFR 30-59 ml/min (HCC)   Essential hypertension   Type II diabetes mellitus (HCC)   Chronic diastolic CHF (congestive heart failure) (HCC)   CAD (coronary artery disease)   Parkinson's disease (HCC)   Hyponatremia   Abdominal distention   SBO (small bowel obstruction) (HCC)   Partial small bowel obstruction (HCC)   Enteritis   Abdominal pain  Time spent: 25 minutes   Kalven Ganim [employer] [phone]. If 7PM-7AM, please contact night-coverage at [email or web address], password [facility] 06/02/2018, 8:10 AM  LOS: 9 days

## 2018-06-02 NOTE — Progress Notes (Signed)
Subjective: Patient denies any significant abdominal pain.  She is tolerating full liquid diet well.  Objective: Vital signs in last 24 hours: Temp:  [98.4 F (36.9 C)-99 F (37.2 C)] 98.4 F (36.9 C) (06/26 0620) Pulse Rate:  [76-91] 76 (06/26 0620) Resp:  [16-20] 20 (06/26 0620) BP: (120-137)/(39-55) 122/49 (06/26 0620) SpO2:  [96 %-100 %] 98 % (06/26 0742) Last BM Date: 06/01/18  Intake/Output from previous day: 06/25 0701 - 06/26 0700 In: 1254.3 [P.O.:596; IV Piggyback:598.3] Out: 400 [Urine:100; Stool:300] Intake/Output this shift: No intake/output data recorded.  General appearance: alert, cooperative and no distress GI: soft, non-tender; bowel sounds normal; no masses,  no organomegaly  Lab Results:  Recent Labs    06/01/18 0505 06/02/18 0630  WBC 12.8* 17.3*  HGB 10.9* 10.5*  HCT 35.8* 35.1*  PLT 316 305   BMET Recent Labs    06/01/18 0505 06/02/18 0630  NA 148* 141  K 3.9 3.1*  CL 106 104  CO2 33* 27  GLUCOSE 103* 116*  BUN 23 16  CREATININE 1.12* 1.07*  CALCIUM 8.9 8.3*   PT/INR No results for input(s): LABPROT, INR in the last 72 hours.  Studies/Results: No results found.  Anti-infectives: Anti-infectives (From admission, onward)   Start     Dose/Rate Route Frequency Ordered Stop   06/03/18 1000  amoxicillin-clavulanate (AUGMENTIN) 875-125 MG per tablet 1 tablet     1 tablet Oral Every 12 hours 06/02/18 1056     05/28/18 1300  metroNIDAZOLE (FLAGYL) IVPB 500 mg     500 mg 100 mL/hr over 60 Minutes Intravenous Every 8 hours 05/28/18 1208 06/03/18 0459   05/26/18 0600  ceFEPIme (MAXIPIME) 2 g in sodium chloride 0.9 % 100 mL IVPB  Status:  Discontinued     2 g 200 mL/hr over 30 Minutes Intravenous Every 24 hours 05/25/18 0755 05/25/18 1005   05/25/18 1800  vancomycin (VANCOCIN) 1,250 mg in sodium chloride 0.9 % 250 mL IVPB  Status:  Discontinued     1,250 mg 166.7 mL/hr over 90 Minutes Intravenous Every 24 hours 05/25/18 0755 05/25/18  1005   05/25/18 1200  Ampicillin-Sulbactam (UNASYN) 3 g in sodium chloride 0.9 % 100 mL IVPB     3 g 200 mL/hr over 30 Minutes Intravenous Every 8 hours 05/25/18 1010 06/03/18 0359   05/25/18 0500  ceFEPIme (MAXIPIME) 1 g in sodium chloride 0.9 % 100 mL IVPB  Status:  Discontinued     1 g 200 mL/hr over 30 Minutes Intravenous Every 8 hours 05/25/18 0024 05/25/18 0755   05/25/18 0030  ceFEPIme (MAXIPIME) 1 g in sodium chloride 0.9 % 100 mL IVPB  Status:  Discontinued     1 g 200 mL/hr over 30 Minutes Intravenous Every 8 hours 05/25/18 0024 05/25/18 0024   05/24/18 2100  ceFEPIme (MAXIPIME) 2 g in sodium chloride 0.9 % 100 mL IVPB     2 g 200 mL/hr over 30 Minutes Intravenous  Once 05/24/18 2049 05/24/18 2235   05/24/18 2100  vancomycin (VANCOCIN) IVPB 1000 mg/200 mL premix     1,000 mg 200 mL/hr over 60 Minutes Intravenous  Once 05/24/18 2049 05/24/18 2341      Assessment/Plan: Impression: Ileus secondary to enteritis and pneumonia seems to have resolved.  She did have a bump in her leukocytosis, the etiology which is unknown.  She is getting close to completing her treatment for the enteritis.  May stop treatment for enteritis.  No acute surgical intervention needed.  Will advance  diet as tolerated by the patient.  We will follow peripherally with you.  Please call me if I can be of further assistance.  LOS: 9 days    Aviva Signs 06/02/2018

## 2018-06-03 ENCOUNTER — Encounter (HOSPITAL_COMMUNITY): Payer: Self-pay | Admitting: Gastroenterology

## 2018-06-03 ENCOUNTER — Inpatient Hospital Stay (HOSPITAL_COMMUNITY): Payer: Medicare Other | Admitting: Anesthesiology

## 2018-06-03 ENCOUNTER — Encounter (HOSPITAL_COMMUNITY)
Admission: EM | Disposition: A | Discharge status: Skilled Nursing Facility | Payer: Self-pay | Source: Home / Self Care | Attending: Family Medicine

## 2018-06-03 ENCOUNTER — Other Ambulatory Visit: Payer: Self-pay

## 2018-06-03 DIAGNOSIS — I8500 Esophageal varices without bleeding: Secondary | ICD-10-CM

## 2018-06-03 DIAGNOSIS — K921 Melena: Secondary | ICD-10-CM

## 2018-06-03 DIAGNOSIS — K3189 Other diseases of stomach and duodenum: Secondary | ICD-10-CM

## 2018-06-03 DIAGNOSIS — J189 Pneumonia, unspecified organism: Secondary | ICD-10-CM

## 2018-06-03 DIAGNOSIS — R1310 Dysphagia, unspecified: Secondary | ICD-10-CM

## 2018-06-03 DIAGNOSIS — K56600 Partial intestinal obstruction, unspecified as to cause: Secondary | ICD-10-CM

## 2018-06-03 HISTORY — PX: ESOPHAGOGASTRODUODENOSCOPY (EGD) WITH PROPOFOL: SHX5813

## 2018-06-03 HISTORY — PX: BIOPSY: SHX5522

## 2018-06-03 HISTORY — PX: MALONEY DILATION: SHX5535

## 2018-06-03 LAB — CBC WITH DIFFERENTIAL/PLATELET
Basophils Absolute: 0 10*3/uL (ref 0.0–0.1)
Basophils Relative: 0 %
Eosinophils Absolute: 0.2 10*3/uL (ref 0.0–0.7)
Eosinophils Relative: 2 %
HCT: 29.8 % — ABNORMAL LOW (ref 36.0–46.0)
Hemoglobin: 8.8 g/dL — ABNORMAL LOW (ref 12.0–15.0)
Lymphocytes Relative: 12 %
Lymphs Abs: 1.3 10*3/uL (ref 0.7–4.0)
MCH: 25.7 pg — ABNORMAL LOW (ref 26.0–34.0)
MCHC: 29.5 g/dL — ABNORMAL LOW (ref 30.0–36.0)
MCV: 87.1 fL (ref 78.0–100.0)
Monocytes Absolute: 1.1 10*3/uL — ABNORMAL HIGH (ref 0.1–1.0)
Monocytes Relative: 10 %
Neutro Abs: 8.6 10*3/uL — ABNORMAL HIGH (ref 1.7–7.7)
Neutrophils Relative %: 76 %
Platelets: 284 10*3/uL (ref 150–400)
RBC: 3.42 MIL/uL — ABNORMAL LOW (ref 3.87–5.11)
RDW: 15.9 % — ABNORMAL HIGH (ref 11.5–15.5)
WBC Morphology: INCREASED
WBC: 11.2 10*3/uL — ABNORMAL HIGH (ref 4.0–10.5)

## 2018-06-03 LAB — BASIC METABOLIC PANEL
Anion gap: 7 (ref 5–15)
BUN: 13 mg/dL (ref 8–23)
CO2: 29 mmol/L (ref 22–32)
Calcium: 8.1 mg/dL — ABNORMAL LOW (ref 8.9–10.3)
Chloride: 104 mmol/L (ref 98–111)
Creatinine, Ser: 0.94 mg/dL (ref 0.44–1.00)
GFR calc Af Amer: >60 mL/min (ref >60)
GFR calc non Af Amer: 55 mL/min — ABNORMAL LOW (ref >60)
Glucose, Bld: 121 mg/dL — ABNORMAL HIGH (ref 70–99)
Potassium: 3.5 mmol/L (ref 3.5–5.1)
Sodium: 140 mmol/L (ref 135–145)

## 2018-06-03 LAB — GLUCOSE, CAPILLARY
Glucose-Capillary: 123 mg/dL — ABNORMAL HIGH (ref 70–99)
Glucose-Capillary: 123 mg/dL — ABNORMAL HIGH (ref 70–99)
Glucose-Capillary: 123 mg/dL — ABNORMAL HIGH (ref 70–99)
Glucose-Capillary: 152 mg/dL — ABNORMAL HIGH (ref 70–99)
Glucose-Capillary: 157 mg/dL — ABNORMAL HIGH (ref 70–99)
Glucose-Capillary: 98 mg/dL (ref 70–99)
Glucose-Capillary: 99 mg/dL (ref 70–99)

## 2018-06-03 LAB — MAGNESIUM: Magnesium: 2.1 mg/dL (ref 1.7–2.4)

## 2018-06-03 SURGERY — ESOPHAGOGASTRODUODENOSCOPY (EGD) WITH PROPOFOL
Anesthesia: Monitor Anesthesia Care

## 2018-06-03 MED ORDER — PROPOFOL 500 MG/50ML IV EMUL
INTRAVENOUS | Status: DC | PRN
  Administered 2018-06-03: 100 ug/kg/min via INTRAVENOUS

## 2018-06-03 MED ORDER — PROPOFOL 10 MG/ML IV BOLUS
INTRAVENOUS | Status: AC
  Filled 2018-06-03: qty 40

## 2018-06-03 MED ORDER — PANTOPRAZOLE SODIUM 40 MG PO TBEC
40.0000 mg | DELAYED_RELEASE_TABLET | Freq: Two times a day (BID) | ORAL | Status: DC
  Administered 2018-06-03 – 2018-06-04 (×3): 40 mg via ORAL
  Filled 2018-06-03 (×3): qty 1

## 2018-06-03 MED ORDER — LIDOCAINE VISCOUS HCL 2 % MT SOLN
OROMUCOSAL | Status: AC
  Filled 2018-06-03: qty 15

## 2018-06-03 MED ORDER — LIDOCAINE VISCOUS HCL 2 % MT SOLN
OROMUCOSAL | Status: DC | PRN
  Administered 2018-06-03: 1 via OROMUCOSAL

## 2018-06-03 MED ORDER — PROPOFOL 10 MG/ML IV BOLUS
INTRAVENOUS | Status: DC | PRN
  Administered 2018-06-03: 20 mg via INTRAVENOUS

## 2018-06-03 MED ORDER — MIDAZOLAM HCL 5 MG/5ML IJ SOLN
INTRAMUSCULAR | Status: DC | PRN
  Administered 2018-06-03: 1 mg via INTRAVENOUS

## 2018-06-03 MED ORDER — MIDAZOLAM HCL 2 MG/2ML IJ SOLN
INTRAMUSCULAR | Status: AC
  Filled 2018-06-03: qty 2

## 2018-06-03 MED ORDER — LACTATED RINGERS IV SOLN
INTRAVENOUS | Status: DC
  Administered 2018-06-03: 16:00:00 via INTRAVENOUS

## 2018-06-03 NOTE — Care Management Note (Signed)
Case Management Note  Patient Details  Name: MELDA MERMELSTEIN MRN: [id number] Date of Birth: [birth year]    If discussed at Long Length of Stay Meetings, dates discussed:  06/03/2018  Additional Comments:  Maryjean Corpening, Chauncey Reading, RN 06/03/2018, 12:02 PM

## 2018-06-03 NOTE — Consult Note (Signed)
Referring Provider: Murlean Iba, MD Primary Care Physician:  Rosita Fire, MD Primary Gastroenterologist:  Barney Drain, MD  Reason for Consultation:  melena  HPI: Angela Lawson is a 84 y.o. female with Stage IV adenocarcinoma of the lung on immunotherapy, diastolic heart failure, COPD, DM,  Parkinson's disease who presented to the hospital 10 days ago with worsening SOB, abdominal pain, vomiting, abd distention. She was found to have PNA and bowel obstruction. She required NGT for decompression. Surgery seeing patient and managing for enteritis with ileus. She was able to have NGT removed two days ago and had been tolerating full liquid diet.   She presented to ED and had CT A/P without CM on 05/22/18 showed colonic diverticulosis without inflammation and no bowel issues otherwise. Returned to ED two days later and a second CT A/P with CM showed worsening left lung base opacity, mild biliary dilation left lobe of the liver likely post-surgical, small bowel dilated to 3.9cm with air-fluid levels and mural edema, Focal inflammatory changes of small bowel in LEFT central pelvis (image 55). Multiple small-bowel transition points in the pelvis. The stomach, large bowel are normal in course and caliber without inflammatory changes.   At time of admission, patient has Hgb 12.3 and it down to 8.8 today. WBC was 21,000 and down to 11,200.  Patient states her abdominal pain has been periumbilical but improved since admission. Her appetite has been poor, nausea persistent. No vomiting in couple of days. No further heartburn. Complains of dysphagia to solid foods. She reports stools have been "dark" and sticky for "awhile". She is a difficult historian. She reports having a dark stool overnight and this morning. Several stools recorded in epic without description and current nurse states she has not seen melena and did not get report of melena from night nurse. No brbpr. She has however had significant  change in hgb overnight from 10.5 to 8.8.  05/27/18 0500  168 lb 10.4 oz (76.5 kg)  -  28.93 TW   05/25/18 0500  214 lb 4.6 oz (97.2 kg)  -  36.76 RW   05/24/18 2312  174 lb 9.7 oz (79.2 kg)  1.89 sq meters  29.96 DC   05/24/18 1733  154 lb (69.9 kg)  1.78 sq meters  26.42 JE    Weights most likely inaccurate given significant fluctuation.      Prior to Admission medications   Medication Sig Start Date End Date Taking? Authorizing Provider  albuterol (PROAIR HFA) 108 (90 BASE) MCG/ACT inhaler Inhale 2 puffs into the lungs every 4 (four) hours as needed for wheezing or shortness of breath.   Yes [provider]  alendronate (FOSAMAX) 70 MG tablet Take 70 mg by mouth every Friday.    Yes [provider]  aspirin EC 81 MG tablet Take 81 mg by mouth daily.   Yes [provider]  atorvastatin (LIPITOR) 40 MG tablet Take 40 mg by mouth at bedtime.    Yes [provider]  B-D ULTRAFINE III SHORT PEN 31G X 8 MM MISC See admin instructions. 12/27/17  Yes [provider]  benzonatate (TESSALON) 200 MG capsule TAKE 1 CAPSULE BY MOUTH THREE TIMES A DAY AS NEEDED 04/23/18  Yes [provider]  buPROPion (WELLBUTRIN SR) 150 MG 12 hr tablet Take 150 mg by mouth 2 (two) times daily.    Yes [provider]  carbidopa-levodopa (SINEMET IR) 25-100 MG tablet Take 1 tablet by mouth 2 (two) times daily.  Yes [provider]  cholecalciferol (VITAMIN D) 1000 units tablet Take 1,000 Units by mouth daily.   Yes [provider]  furosemide (LASIX) 40 MG tablet Take 1 tablet (40 mg total) by mouth daily. Pt is able to take one additional tablet daily for weight increases of 3lbs in a day or 5lbs in a week. Resume on 10/20 09/23/17  Yes Kathie Dike, MD  gabapentin (NEURONTIN) 300 MG capsule Take 600 mg by mouth at bedtime.    Yes [provider]  Garlic 948 MG TABS Take 300 mg by mouth daily.   Yes [provider]   ipratropium-albuterol (DUONEB) 0.5-2.5 (3) MG/3ML SOLN Inhale 3 mLs into the lungs every 6 (six) hours as needed (for wheezing/shortness of breath).    Yes [provider]  KLOR-CON 10 10 MEQ tablet TAKE 1 TABLET (10 MEQ TOTAL) BY MOUTH DAILY. *FURTHER REFILLS NEED TO BE AUTHORIZED BY PCP* Patient taking differently: TAKE 1 TABLET (10 MEQ TOTAL) BY MOUTH DAILY. 08/11/17  Yes Lorretta Harp, MD  LANTUS SOLOSTAR 100 UNIT/ML Solostar Pen Inject 15 Units into the skin at bedtime.    Yes [provider]  lidocaine-prilocaine (EMLA) cream Apply to affected area once Patient taking differently: Apply 1 application topically daily as needed.  05/19/17  Yes Brunetta Genera, MD  linagliptin (TRADJENTA) 5 MG TABS tablet Take 5 mg by mouth daily.   Yes [provider]  loratadine (CLARITIN) 10 MG tablet Take 10 mg by mouth daily.   Yes [provider]  losartan (COZAAR) 50 MG tablet Take 50 mg by mouth daily. 02/22/18  Yes [provider]  meclizine (ANTIVERT) 25 MG tablet Take 25 mg by mouth daily.    Yes [provider]  metoprolol succinate (TOPROL-XL) 25 MG 24 hr tablet Take 1 tablet (25 mg total) by mouth daily. 06/26/15  Yes Jettie Booze, MD  mometasone-formoterol [facility]) 200-5 MCG/ACT AERO Inhale 2 puffs into the lungs 2 (two) times daily. 02/08/18  Yes Kathie Dike, MD  Omega-3 Fatty Acids (FISH OIL PO) Take 1 capsule by mouth daily.   Yes [provider]  omeprazole (PRILOSEC) 20 MG capsule Take 20 mg by mouth 2 (two) times daily. 04/23/18  Yes [provider]  ondansetron (ZOFRAN ODT) 4 MG disintegrating tablet Take 1 tablet (4 mg total) by mouth every 8 (eight) hours as needed for nausea or vomiting. 05/22/18  Yes Francine Graven, DO  OXYGEN Inhale 3 L into the lungs continuous.   Yes [provider]  polyethylene glycol powder (GLYCOLAX/MIRALAX) powder Take 17 g by mouth daily as needed.   Yes [provider]  pyridoxine (B-6) 100 MG tablet Take 100 mg by mouth daily.    Yes [provider]  rOPINIRole (REQUIP) 0.5 MG tablet Take 0.5 mg by mouth 2 (two) times daily.    Yes [provider]  sodium chloride (OCEAN) 0.65 % SOLN nasal spray Place 1 spray into both nostrils every 3 (three) hours as needed for congestion.    Yes [provider]  traMADol (ULTRAM) 50 MG tablet Take 50 mg by mouth every 12 (twelve) hours as needed for moderate pain.    Yes [provider]  TRELEGY ELLIPTA 100-62.5-25 MCG/INH AEPB Inhale 1 puff into the lungs daily.  04/07/18  Yes [provider]  vitamin B-12 (CYANOCOBALAMIN) 500 MCG tablet Take 500 mcg by mouth daily. 02/15/18  Yes [provider]  zolpidem (AMBIEN) 5 MG tablet  Take 5 mg by mouth at bedtime as needed for sleep.   Yes [provider]  Atezolizumab (TECENTRIQ IV) Inject into the vein. Every 3 weeks    [provider]  nitroGLYCERIN (NITROSTAT) 0.4 MG SL tablet Place 0.4 mg under the tongue every 5 (five) minutes as needed for chest pain.    [provider]    Current Facility-Administered Medications  Medication Dose Route Frequency Provider Last Rate Last Dose  . acetaminophen (TYLENOL) tablet 650 mg  650 mg Oral Q6H PRN Reubin Milan, MD   650 mg at 05/25/18 2131   Or  . acetaminophen (TYLENOL) suppository 650 mg  650 mg Rectal Q6H PRN Reubin Milan, MD      . amoxicillin-clavulanate (AUGMENTIN) 875-125 MG per tablet 1 tablet  1 tablet Oral Q12H Johnson, Clanford L, MD      . aspirin EC tablet 81 mg  81 mg Oral Daily Bernerd Pho M, PA-C   81 mg at 06/02/18 0830  . atorvastatin (LIPITOR) tablet 40 mg  40 mg Oral QHS Bernerd Pho M, PA-C   40 mg at 06/02/18 2130  . bisacodyl (DULCOLAX) suppository 10 mg  10 mg Rectal Daily Virl Cagey, MD   10 mg at 06/02/18 0831  . enoxaparin (LOVENOX) injection 40 mg  40 mg Subcutaneous Q24H Reubin Milan, MD   40 mg at 06/02/18 0056  . feeding supplement (BOOST / RESOURCE BREEZE) liquid 1 Container  1 Container Oral TID BM Johnson, Clanford L, MD   1 Container at 06/02/18 1306  . fentaNYL (SUBLIMAZE) injection 75 mcg  75 mcg Intravenous Q2H PRN Aviva Signs, MD   75 mcg at 06/02/18 1423  . insulin aspart (novoLOG) injection 0-9 Units  0-9 Units Subcutaneous Q4H Kathie Dike, MD   1 Units at 06/03/18 0500  . insulin glargine (LANTUS) injection 7 Units  7 Units Subcutaneous QHS Kathie Dike, MD   7 Units at 06/02/18 2130  . ipratropium-albuterol (DUONEB) 0.5-2.5 (3) MG/3ML nebulizer solution 3 mL  3 mL Nebulization Q4H PRN Kathie Dike, MD   3 mL at 05/28/18 0003  . ipratropium-albuterol (DUONEB) 0.5-2.5 (3) MG/3ML nebulizer solution 3 mL  3 mL Nebulization TID Kathie Dike, MD   3 mL at 06/02/18 2057  . losartan (COZAAR) tablet 50 mg  50 mg Oral Daily Bernerd Pho M, PA-C   50 mg at 06/02/18 0831  . MEDLINE mouth rinse  15 mL Mouth Rinse BID Johnson, Clanford L, MD   15 mL at 06/02/18 0842  . metoprolol succinate (TOPROL-XL) 24 hr tablet 25 mg  25 mg Oral Daily Bernerd Pho M, PA-C   25 mg at 06/02/18 0830  . metoprolol tartrate (LOPRESSOR) injection 5 mg  5 mg Intravenous Q6H PRN Erma Heritage, PA-C   5 mg at 05/30/18 0112  . phenol (CHLORASEPTIC) mouth spray 1 spray  1 spray Mouth/Throat PRN Wynetta Emery, Clanford L, MD   1 spray at 06/01/18 0201  . prochlorperazine (COMPAZINE) injection 5 mg  5 mg Intravenous Q4H PRN Reubin Milan, MD   5 mg at 05/25/18 2258  . zolpidem (AMBIEN) tablet 5 mg  5 mg Oral QHS PRN Schorr, Rhetta Mura, NP   5 mg at 06/02/18 2213    Allergies as of 05/24/2018 - Review Complete 05/24/2018  Allergen Reaction Noted  . Lisinopril Cough 07/03/2015    Past Medical History:  Diagnosis Date  . Adenocarcinoma of lung ([place])    Left lung  2009, resected  . Anginal pain ([place])   . Arthritis   . Back pain   . CHF (congestive heart  failure) ([place])   . COPD (chronic obstructive pulmonary disease) ([place])   . Diverticulitis   . Dyslipidemia   . Essential hypertension   . H/O ventral hernia   . Non-obstructive CAD    a. 04/2015 NSTEMI/Cath: LAD 10p, LCX 36m, RCA 62m, 20d, EF 35-40 w/ apical ballooning.  . On home O2    3L N/C  . Osteoporosis   . Osteoporosis 11/04/2015   Managed by Dr. Legrand Rams   . Parkinson's disease ([place])   . Shortness of breath   . Takotsubo cardiomyopathy    a. 04/2015 Echo: EF 45-50%, mid-dist anterior/apical/inferoapical HK w/ hyperdynamic base. Gr 1 DD, mild AI, mild-mod MR, triv TR, PASP 23mmHg;  b. 04/2015 LV gram: Ef 35-40% w/ apical ballooning.  . Type II diabetes mellitus ([place])   . Ventricular bigeminy    a. 04/2015 in setting of NSTEMI/Takotsubo.    Past Surgical History:  Procedure Laterality Date  . ABDOMINAL HYSTERECTOMY    . CARDIAC CATHETERIZATION N/A 04/17/2015   Procedure: Left Heart Cath and Coronary Angiography;  Surgeon: Troy Sine, MD;  Location: [facility];  Service: Cardiovascular;  Laterality: N/A;  . CHOLECYSTECTOMY    . COLONOSCOPY N/A 09/18/2014   Dr. Oneida Alar: diverticulosis throughout the colon, two tubular adenomas removed, internal hemorrhoids. next TCS in 10-15 years if benefits outweigh risks  . ECTOPIC PREGNANCY SURGERY    . INCISIONAL HERNIA REPAIR N/A 08/26/2013   Procedure: HERNIA REPAIR INCISIONAL WITH MESH;  Surgeon: Jamesetta So, MD;  Location: [facility];  Service: General;  Laterality: N/A;  . IR FLUORO GUIDE PORT INSERTION RIGHT  04/30/2017  . IR US GUIDE VASC ACCESS RIGHT  04/30/2017  . LUNG CANCER SURGERY    . VIDEO BRONCHOSCOPY WITH ENDOBRONCHIAL NAVIGATION N/A 04/23/2017   Procedure: VIDEO BRONCHOSCOPY WITH ENDOBRONCHIAL NAVIGATION;  Surgeon: Melrose Nakayama, MD;  Location: [facility];  Service: Thoracic;  Laterality: N/A;  . VIDEO BRONCHOSCOPY WITH ENDOBRONCHIAL ULTRASOUND N/A 04/23/2017   Procedure: VIDEO BRONCHOSCOPY WITH ENDOBRONCHIAL  ULTRASOUND;  Surgeon: Melrose Nakayama, MD;  Location: [facility];  Service: Thoracic;  Laterality: N/A;    Family History  Problem Relation Age of Onset  . Diabetes Mother   . Hypertension Mother   . Diabetes Father   . Asthma Unknown   . Cancer Unknown   . Heart attack Sister        X2  . Colon cancer Neg Hx     Social History   Socioeconomic History  . Marital status: Widowed    Spouse name: Not on file  . Number of children: Not on file  . Years of education: Not on file  . Highest education level: Not on file  Occupational History  . Not on file  Social Needs  . Financial resource strain: Not on file  . Food insecurity:    Worry: Not on file    Inability: Not on file  . Transportation needs:    Medical: Not on file    Non-medical: Not on file  Tobacco Use  . Smoking status: Former Smoker    Years: 20.00    Types: Cigarettes    Last attempt to quit: 08/13/2006    Years since quitting: 11.8  . Smokeless tobacco: Never Used  Substance and Sexual Activity  . Alcohol use: No    Alcohol/week: 0.0 oz  .  Drug use: No  . Sexual activity: Not Currently    Birth control/protection: Surgical  Lifestyle  . Physical activity:    Days per week: Not on file    Minutes per session: Not on file  . Stress: Not on file  Relationships  . Social connections:    Talks on phone: Not on file    Gets together: Not on file    Attends religious service: Not on file    Active member of club or organization: Not on file    Attends meetings of clubs or organizations: Not on file    Relationship status: Not on file  . Intimate partner violence:    Fear of current or ex partner: Not on file    Emotionally abused: Not on file    Physically abused: Not on file    Forced sexual activity: Not on file  Other Topics Concern  . Not on file  Social History Narrative  . Not on file     ROS:  General:+anorexia,  fatigue, weakness. Eyes: Negative for vision changes.  ENT: Negative  for hoarseness,   nasal congestion. See hpi CV: Negative for chest pain, angina, palpitations,   peripheral edema. +DOE Respiratory: on oyxgen 3L at home. Complains of DOE. Breathing at rest better since admission. +cough. GI: See history of present illness. GU:  Negative for dysuria, hematuria, urinary incontinence, urinary frequency, nocturnal urination.  MS: Negative for joint pain, low back pain.  Derm: Negative for rash or itching.  Neuro: Negative for weakness, abnormal sensation, seizure, frequent headaches, memory loss, confusion.  Psych: Negative for anxiety, depression, suicidal ideation, hallucinations.  Endo: Negative for unusual weight change.  Heme: Negative for bruising or bleeding. Allergy: Negative for rash or hives.       Physical Examination: Vital signs in last 24 hours: Temp:  [98.2 F (36.8 C)-98.8 F (37.1 C)] 98.8 F (37.1 C) (06/27 0423) Pulse Rate:  [72-84] 84 (06/27 0814) Resp:  [18-20] 20 (06/27 0423) BP: (117-127)/(44-50) 127/50 (06/27 0814) SpO2:  [88 %-100 %] 88 % (06/27 0803)   General:chronically ill appearing female, in no acute distress.  Head: Normocephalic, atraumatic.   Eyes: Conjunctiva pale, no icterus. Mouth: Oropharyngeal mucosa moist and pink , no lesions erythema or exudate. Neck: Supple without thyromegaly, masses, or lymphadenopathy.  Lungs: Clear to auscultation bilaterally.  Heart: Regular rate and rhythm, no murmurs rubs or gallops.  Abdomen: Bowel sounds are normal,  nondistended, no hepatosplenomegaly or masses, no abdominal bruits or    hernia , no rebound or guarding.  +mild tenderness above umbilicus Rectal: not performed Extremities: No lower extremity edema, clubbing, deformity.  Neuro: Alert and oriented x 4 , grossly normal neurologically.  Skin: Warm and dry, no rash or jaundice.   Psych: Alert and cooperative, normal mood and affect.        Intake/Output from previous day: 06/26 0701 - 06/27 0700 In: 1053.3  [P.O.:840; IV Piggyback:213.3] Out: -  Intake/Output this shift: No intake/output data recorded.  Lab Results: CBC Recent Labs    06/01/18 0505 06/02/18 0630 06/03/18 0426  WBC 12.8* 17.3* 11.2*  HGB 10.9* 10.5* 8.8*  HCT 35.8* 35.1* 29.8*  MCV 87.5 86.9 87.1  PLT 316 305 284   BMET Recent Labs    06/01/18 0505 06/02/18 0630 06/03/18 0426  NA 148* 141 140  K 3.9 3.1* 3.5  CL 106 104 104  CO2 33* 27 29  GLUCOSE 103* 116* 121*  BUN 23 16 13  CREATININE 1.12* 1.07* 0.94  CALCIUM 8.9 8.3* 8.1*   LFT No results for input(s): BILITOT, BILIDIR, IBILI, ALKPHOS, AST, ALT, PROT, ALBUMIN in the last 72 hours.  Lipase No results for input(s): LIPASE in the last 72 hours.  PT/INR No results for input(s): LABPROT, INR in the last 72 hours.    Imaging Studies: Ct Abdomen Pelvis Wo Contrast  Result Date: 05/22/2018 CLINICAL DATA:  Abdominal pain and diarrhea. Per previous CT reports, history of lung cancer status post immunotherapy. EXAM: CT ABDOMEN AND PELVIS WITHOUT CONTRAST TECHNIQUE: Multidetector CT imaging of the abdomen and pelvis was performed following the standard protocol without IV contrast. COMPARISON:  None. FINDINGS: Lower chest: Again noted is the reticular ground-glass opacity at the LEFT lung base, incompletely imaged, similar to previous studies, described as post treatment changes on previous exams. RIGHT lung base is clear. Hepatobiliary: No focal liver abnormality is seen. Status post cholecystectomy. No biliary dilatation. Pancreas: Unremarkable. No pancreatic ductal dilatation or surrounding inflammatory changes. Spleen: Normal in size without focal abnormality. Adrenals/Urinary Tract: Adrenal glands appear normal. Kidneys are unremarkable without mass, stone or hydronephrosis. No ureteral or bladder calculi identified. Bladder appears normal. Stomach/Bowel: No dilated large or small bowel loops. Fairly extensive diverticulosis within the sigmoid and lower  descending colon, but no focal inflammatory change to suggest acute diverticulitis. No bowel wall thickening or evidence of bowel wall inflammation in the abdomen or pelvis. Stomach is unremarkable, partially decompressed. Vascular/Lymphatic: Aortic atherosclerosis. No enlarged abdominal or pelvic lymph nodes. Reproductive: Presumed hysterectomy.  No adnexal mass or free fluid. Other: No free fluid or abscess collection. No free intraperitoneal air. Musculoskeletal: Degenerative changes throughout the thoracolumbar spine, mild to moderate in degree. No acute or suspicious osseous finding. IMPRESSION: 1. No acute findings within the abdomen or pelvis. No evidence of metastasis. No bowel obstruction or evidence of bowel wall inflammation. No evidence of acute solid organ abnormality. No free fluid or abscess. 2. Colonic diverticulosis without evidence of acute diverticulitis. 3. Aortic atherosclerosis. Electronically Signed   By: Franki Cabot M.D.   On: 05/22/2018 17:53   Dg Chest 2 View  Result Date: 05/24/2018 CLINICAL DATA:  83 year old female with shortness of breath and abdominal pain for 4 days. Treated left lung cancer. EXAM: CHEST - 2 VIEW COMPARISON:  Chest radiograph 05/22/2018. Restaging CT chest abdomen and pelvis 03/15/2018, and earlier. FINDINGS: Upright AP and lateral views of the chest. Chronic architectural distortion and volume loss in the left lower lung. Stable lung volumes and mediastinal contours. Stable right chest porta cath. Calcified aortic atherosclerosis. No pneumothorax, pulmonary edema, pleural effusion or acute pulmonary opacity. Stable visualized osseous structures. Negative visible bowel gas pattern. IMPRESSION: Stable post treatment appearance of the chest. No new cardiopulmonary abnormality. Electronically Signed   By: Genevie Ann M.D.   On: 05/24/2018 19:23   Dg Chest 2 View  Result Date: 05/22/2018 CLINICAL DATA:  abd pain, N/V/D, pt having lung cancer treatments now EXAM:  CHEST - 2 VIEW COMPARISON:  Chest x-rays dated 02/05/2018 and 02/04/2018. FINDINGS: Stable volume loss in the LEFT hemithorax with basilar pleuroparenchymal scarring. No new lung findings. No pneumothorax seen. Heart size and mediastinal contours are stable. RIGHT chest wall Port-A-Cath is stable in position with tip at the level of the mid SVC. No acute or suspicious osseous finding. IMPRESSION: Stable chest x-ray. No active cardiopulmonary disease. No evidence of pneumonia or pulmonary edema. Electronically Signed   By: Franki Cabot M.D.   On: 05/22/2018 18:07  Dg Abd 1 View  Result Date: 05/29/2018 CLINICAL DATA:  Bowel obstruction EXAM: ABDOMEN - 1 VIEW COMPARISON:  05/26/2018, CT 05/25/2018 FINDINGS: Persistent gaseous enlargement of central small bowel loops, measuring up to 4.3 cm slight increase compared to prior. Contrast material within the right hemiabdomen. Residual dilute contrast within the urinary bladder. Partially visualized esophageal tube tip overlies the gastric fundus with side port projecting over the distal esophagus. IMPRESSION: Slight increased gaseous dilatation of central small bowel loops consistent with bowel obstruction. Side port tip of the esophageal tube projects over distal esophagus, suggest further advancement for more optimal positioning. Electronically Signed   By: Donavan Foil M.D.   On: 05/29/2018 22:26   Ct Angio Chest Pe W And/or Wo Contrast  Result Date: 05/24/2018 CLINICAL DATA:  84 y/o F; shortness of breath and abdominal pain for 4 days. Worsening symptoms. History of lung cancer post resection. EXAM: CT ANGIOGRAPHY CHEST WITH CONTRAST TECHNIQUE: Multidetector CT imaging of the chest was performed using the standard protocol during bolus administration of intravenous contrast. Multiplanar CT image reconstructions and MIPs were obtained to evaluate the vascular anatomy. CONTRAST:  80 cc Isovue 370 COMPARISON:  03/15/2018 CT chest. FINDINGS: Cardiovascular:  Satisfactory opacification of the pulmonary arteries to the segmental level. No evidence of pulmonary embolism. Normal heart size. No pericardial effusion. Moderate calcific atherosclerosis of the coronary arteries and aorta. Mediastinum/Nodes: No enlarged mediastinal, hilar, or axillary lymph nodes. Thyroid gland, trachea, and esophagus demonstrate no significant findings. Small hiatal hernia. Lungs/Pleura: Stable postsurgical changes the left inferior hilum and lung base. Stable thick rimmed extrapleural cavity the left lower as ago esophageal recess prior surgery. Increasing ground-glass opacification with patchy areas of consolidation of the dependent left lung may represent progressive posttreatment changes or superimposed pneumonitis. No pleural effusion or interval pneumothorax. There are several small 3-4 mm nodules at the periphery of the lungs which are stable from prior study. Stable scarring in the right lung apex. Stable emphysema. Upper Abdomen: No acute abnormality. Musculoskeletal: Multiple chronic bilateral rib deformities from prior surgery and/or trauma. No acute fracture identified. Review of the MIP images confirms the above findings. IMPRESSION: 1. No pulmonary embolus identified. 2. Increasing ground-glass opacification and patchy areas of consolidation in the dependent left lung may represent progressive posttreatment changes or superimposed pneumonitis. No gross mass lesion to suggest recurrent disease. 3. Moderate aortic and coronary calcific atherosclerosis. 4. Small hiatal hernia. 5. Stable postsurgical changes in the left infrahilar region. Electronically Signed   By: Kristine Garbe M.D.   On: 05/24/2018 20:37   Ct Abdomen Pelvis W Contrast  Result Date: 05/25/2018 CLINICAL DATA:  Worsening abdominal pain, vomiting and distension for 2 days. History of lung cancer, COPD, hysterectomy, cholecystectomy, diverticulosis. EXAM: CT ABDOMEN AND PELVIS WITH CONTRAST TECHNIQUE:  Multidetector CT imaging of the abdomen and pelvis was performed using the standard protocol following bolus administration of intravenous contrast. CONTRAST:  124mL ISOVUE-300 IOPAMIDOL (ISOVUE-300) INJECTION 61% COMPARISON:  CT abdomen and pelvis May 22, 2018 and CT chest May 24, 2018. FINDINGS: LOWER CHEST: LEFT lung base fibrosis tenting the esophagus. Distended esophagus with small air contrast level. Worsening LEFT lung base patchy airspace opacity. HEPATOBILIARY: Mild biliary dilatation LEFT lobe of the liver, likely postprocedural. Status post cholecystectomy. No intrahepatic masses. PANCREAS: Nonacute.  Punctate calcification. SPLEEN: Normal. ADRENALS/URINARY TRACT: Kidneys are orthotopic, demonstrating symmetric enhancement. No obstructing nephrolithiasis, hydronephrosis or solid renal masses. Early excretion of contrast limits sensitivity for potential small nonobstructing nephrolithiasis. The unopacified ureters are normal  in course and caliber. Delayed imaging through the kidneys demonstrates symmetric prompt contrast excretion within the proximal urinary collecting system. Urinary bladder is well distended containing contrast. Normal adrenal glands. STOMACH/BOWEL: Small bowel dilated to 3.9 cm with air-fluid levels and mural edema most apparent within the pelvis. Focal inflammatory changes of small bowel in LEFT central pelvis (image 55). Multiple small-bowel transition points in the pelvis. The stomach, large bowel are normal in course and caliber without inflammatory changes. Severe sigmoid and to lesser extent descending colonic diverticulosis. VASCULAR/LYMPHATIC: Mildly patent infrarenal aorta. Moderate calcific atherosclerosis. No lymphadenopathy by CT size criteria. REPRODUCTIVE: Status post hysterectomy. OTHER: Small volume free fluid in the pelvis. No intraperitoneal free air or focal fluid collections. Mesenteric fat stranding mid pelvis. MUSCULOSKELETAL: Nonacute. Moderate to severe LEFT  hip osteoarthrosis. Degenerative changes lumbar spine superimposed on congenital canal narrowing. IMPRESSION: 1. Low-grade small bowel obstruction with multiple transition points in the pelvis associated with inflammatory changes, this may be secondary to enteritis or adhesions. 2. LEFT lung base fibrosis with superimposed worsening airspace opacities, this could reflect post radiation pneumonitis or pneumonia. 3. Colonic diverticulosis without acute diverticulitis. Aortic Atherosclerosis (ICD10-I70.0). Electronically Signed   By: Elon Alas M.D.   On: 05/25/2018 05:40   Dg Chest Port 1 View  Result Date: 06/02/2018 CLINICAL DATA:  Leukocytosis, resected left lung cancer EXAM: PORTABLE CHEST 1 VIEW COMPARISON:  05/26/2018 chest radiograph. FINDINGS: Right subclavian MediPort terminates in the lower third of the SVC. Stable cardiomediastinal silhouette with mild cardiomegaly. No pneumothorax. No pleural effusion. No overt pulmonary edema. Patchy left lung base opacity, not appreciably changed. Emphysema. IMPRESSION: 1. No appreciable change in nonspecific patchy left lung base opacity, which could represent scarring, atelectasis, pneumonia or post treatment change. 2.  Emphysema (ICD10-J43.9). Electronically Signed   By: Ilona Sorrel M.D.   On: 06/02/2018 16:24   Dg Chest Port 1 View  Result Date: 05/26/2018 CLINICAL DATA:  NG tube placement. EXAM: PORTABLE CHEST 1 VIEW COMPARISON:  Chest x-ray from yesterday. FINDINGS: The patient is rotated to the left. Interval placement of an NG tube seen entering the stomach, with the tip below the field of view. Unchanged right chest wall port catheter. Stable cardiomegaly and pulmonary vascular congestion. Low lung volumes with increased right basilar atelectasis and unchanged left lower lobe opacity. No pleural effusion or pneumothorax. No acute osseous abnormality. IMPRESSION: 1. NG tube entering the stomach with tip below the field of view. 2. Unchanged left  lower lobe airspace disease versus post radiation treatment changes. 3. Stable mild pulmonary vascular congestion. Electronically Signed   By: Titus Dubin M.D.   On: 05/26/2018 12:42   Dg Chest Port 1 View  Result Date: 05/25/2018 CLINICAL DATA:  Shortness of breath. History of lung malignancy resected 10 years ago, COPD, CHF. EXAM: PORTABLE CHEST 1 VIEW COMPARISON:  Chest x-ray and chest CT scan of May 24, 2018 FINDINGS: The right lung is well-expanded and clear. On the left there is increased density at the lung base more conspicuous today. The left hemidiaphragm is further obscured. The cardiac silhouette is enlarged and the pulmonary vascularity mildly engorged. The power port catheter tip projects over the midportion of the SVC. There is calcification in the wall of the aortic arch. IMPRESSION: Increased density in the left lower lobe similar to that seen on the CT scan yesterday consistent with progressive post radiation change or superimposed pneumonia. Given the worsening over the past day I favor the latter. There is low-grade CHF which  is fairly stable. Thoracic aortic atherosclerosis. Electronically Signed   By: David  Martinique M.D.   On: 05/25/2018 11:46   Dg Abd Portable 1v  Result Date: 05/26/2018 CLINICAL DATA:  Small-bowel obstruction EXAM: PORTABLE ABDOMEN - 1 VIEW COMPARISON:  Abdominal and pelvic CT scan dated May 25, 2018 FINDINGS: There remain loops of mildly distended gas and fluid-filled small bowel in the mid abdomen. No free extraluminal gas collections are observed. No significant colonic or rectal gas or stool is observed. IMPRESSION: Persistent mid to distal small bowel obstruction. Electronically Signed   By: David  Martinique M.D.   On: 05/26/2018 07:45  [4 week]   Impression: 84 y/o female with Stage IV adenocarcinoma of the lung currently on immunotherapy who presented to the emergency department over 10 days ago with complaints of abdominal pain, vomiting, increasing  shortness of breath.  Initial CT scan without evidence of bowel issues.  2 days later came back to the ED and was noted Low-grade small bowel obstruction with multiple transition points in the pelvis associated with inflammatory changes, this may be secondary to enteritis or adhesions and LEFT lung base fibrosis with superimposed worsening airspace opacities, this could reflect post radiation pneumonitis or pneumonia.  Initially required NG tube decompression.  She has been on antibiotic therapy.  Slowly improving.  Patient reported several episodes of melena overnight, telling me today that she has had this off and on for a while.  Current nursing staff unable to verify history, multiple stools documented in the electronic medical record without any descriptions.  She has had a notable change in her hemoglobin overnight.  Cannot exclude possibility of upper GI bleed.      Plan: 1. We will consider upper endoscopy when patient is stable from a respiratory standpoint.  To discuss with Dr. Gala Romney regarding timing.  Keep her n.p.o. until that is determined.  Chest x-ray yesterday with no appreciable change not clear that patchy left lung base opacity is pneumonia could be scarring, atelectasis versus posttreatment change.  Baseline emphysema. 2. She received Lovenox at 0056 this morning which may delay EGD as well.  3. Continue PPI BID.  4. Further recommendations to follow.   We would like to thank you for the opportunity to participate in the care of Angela Lawson.  Laureen Ochs. Bernarda Caffey [facility] [phone] 6/27/20199:41 AM     LOS: 10 days

## 2018-06-03 NOTE — Progress Notes (Signed)
PT Cancellation Note  Patient Details Name: Angela Lawson MRN: [id number] DOB: [birth year]   Cancelled Treatment:    Reason Eval/Treat Not Completed: Medical issues which prohibited therapy, Hgb trending down, GI consulted. Continue PT when medically stable.   Marcelino Freestone PT [phone]  06/03/2018, 12:08 PM

## 2018-06-03 NOTE — Anesthesia Preprocedure Evaluation (Addendum)
Anesthesia Evaluation  Patient identified by MRN, date of birth, ID band Patient awake    Reviewed: Allergy & Precautions, H&P , NPO status , Patient's Chart, lab work & pertinent test results, reviewed documented beta blocker date and time   Airway Mallampati: II  TM Distance: >3 FB Neck ROM: full    Dental no notable dental hx. (+) Edentulous Upper   Pulmonary neg pulmonary ROS, shortness of breath, pneumonia, COPD, former smoker,    Pulmonary exam normal breath sounds clear to auscultation       Cardiovascular Exercise Tolerance: Good hypertension, + angina + CAD, + Past MI and +CHF  negative cardio ROS   Rhythm:regular Rate:Normal     Neuro/Psych negative neurological ROS  negative psych ROS   GI/Hepatic negative GI ROS, Neg liver ROS,   Endo/Other  negative endocrine ROSdiabetes  Renal/GU Renal diseasenegative Renal ROS  negative genitourinary   Musculoskeletal   Abdominal   Peds  Hematology negative hematology ROS (+)   Anesthesia Other Findings Admit Summary: 84 year old female with a history of lung cancer coronary artery disease, diastolic heart failure, COPD, presents to the hospital with complaints of worsening shortness of breath as well as abdominal pain, vomiting and distention.  Found to have pneumonia as well as bowel obstruction.  Started on intravenous antibiotics.   Echo:  LV EF: 65% -   70%  Reproductive/Obstetrics negative OB ROS                            Anesthesia Physical Anesthesia Plan  ASA: III  Anesthesia Plan: MAC   Post-op Pain Management:    Induction:   PONV Risk Score and Plan:   Airway Management Planned:   Additional Equipment:   Intra-op Plan:   Post-operative Plan:   Informed Consent: I have reviewed the patients History and Physical, chart, labs and discussed the procedure including the risks, benefits and alternatives for the proposed  anesthesia with the patient or authorized representative who has indicated his/her understanding and acceptance.   Dental Advisory Given  Plan Discussed with: CRNA  Anesthesia Plan Comments:         Anesthesia Quick Evaluation

## 2018-06-03 NOTE — NC FL2 (Signed)
[place] MEDICAID FL2 LEVEL OF CARE SCREENING TOOL     IDENTIFICATION  Patient Name: Angela Lawson Birthdate: September 19, 1934 Sex: female Admission Date (Current Location): 05/24/2018  [facility] and [place] Number:  [employer] and Address:  [place] [address] [place]      Provider Number: [id number]  Attending Physician Name and Address:  Murlean Iba, MD  Relative Name and Phone Number:       Current Level of Care: Hospital Recommended Level of Care: [place] Prior Approval Number:    Date Approved/Denied:   PASRR Number: [id number])  Discharge Plan: SNF    Current Diagnoses: Patient Active Problem List   Diagnosis Date Noted  . Black tarry stools 06/03/2018  . Pneumonitis   . Abdominal pain   . Enteritis   . Partial small bowel obstruction ([place])   . Abdominal distention 05/25/2018  . SBO (small bowel obstruction) ([place])   . HCAP (healthcare-associated pneumonia) 05/24/2018  . CAD (coronary artery disease) 05/24/2018  . Parkinson's disease ([place]) 05/24/2018  . Hyponatremia 05/24/2018  . Respiratory failure ([place]) 02/06/2018  . Acute respiratory failure ([place]) 12/04/2017  . Community acquired pneumonia of left lung 07/19/2017  . Critical lower limb ischemia 07/07/2017  . Counseling regarding advanced care planning and goals of care 05/15/2017  . Acute renal injury ([place]) 04/04/2017  . Chest pain 04/04/2017  . Nausea and vomiting 04/04/2017  . COPD with acute exacerbation ([place]) 04/25/2016  . Acute on chronic respiratory failure with hypoxia ([place]) 04/25/2016  . SOB (shortness of breath)   . Osteoporosis 11/04/2015  . Chronic diastolic CHF (congestive heart failure) ([place]) 08/01/2015  . CHF exacerbation ([place]) 07/09/2015  . Essential hypertension 04/19/2015  . Type II diabetes mellitus ([place]) 04/19/2015  . Dyslipidemia 04/19/2015  . History of lung cancer 04/19/2015  . Takotsubo  cardiomyopathy 04/19/2015  . Ventricular bigeminy 04/19/2015  . NSTEMI (non-ST elevated myocardial infarction) ([place]) 04/19/2015  . Stress-induced cardiomyopathy 04/18/2015  . Elevated troponin 04/16/2015  . COPD exacerbation ([place]) 04/16/2015  . CKD (chronic kidney disease) stage 3, GFR 30-59 ml/min (HCC) 04/16/2015  . Herpes genitalis in women 11/24/2014  . Toe fracture 09/28/2014  . Non-traumatic tear of right rotator cuff 11/30/2013  . Pain in joint, shoulder region 11/30/2013  . Muscle weakness (generalized) 11/30/2013  . Bursitis of left shoulder 02/09/2013  . Adenocarcinoma of lung ([place]) 08/20/2012    Orientation RESPIRATION BLADDER Height & Weight     Self, Place  O2(3L) Continent Weight: 168 lb 10.4 oz (76.5 kg) Height:  5\' 4"  (162.6 cm)  BEHAVIORAL SYMPTOMS/MOOD NEUROLOGICAL BOWEL NUTRITION STATUS      Continent Diet(see discharge summary )  AMBULATORY STATUS COMMUNICATION OF NEEDS Skin   Limited Assist Verbally Normal                       Personal Care Assistance Level of Assistance  Bathing, Feeding, Dressing Bathing Assistance: Limited assistance Feeding assistance: Independent Dressing Assistance: Limited assistance     Functional Limitations Info  Sight, Hearing, Speech Sight Info: Adequate Hearing Info: Adequate Speech Info: Adequate    SPECIAL CARE FACTORS FREQUENCY  PT (By licensed PT)     PT Frequency: 5x/week              Contractures Contractures Info: Not present    Additional Factors Info  Code Status, Allergies, Psychotropic Code Status Info: Full code Allergies Info: Lisinopril Psychotropic Info: Wellbutrin  Current Medications (06/03/2018):  This is the current hospital active medication list Current Facility-Administered Medications  Medication Dose Route Frequency Provider Last Rate Last Dose  . acetaminophen (TYLENOL) tablet 650 mg  650 mg Oral Q6H PRN Reubin Milan, MD   650 mg at 05/25/18 2131   Or  .  acetaminophen (TYLENOL) suppository 650 mg  650 mg Rectal Q6H PRN Reubin Milan, MD      . amoxicillin-clavulanate (AUGMENTIN) 875-125 MG per tablet 1 tablet  1 tablet Oral Q12H Johnson, Clanford L, MD   1 tablet at 06/03/18 0815  . aspirin EC tablet 81 mg  81 mg Oral Daily Erma Heritage, PA-C   81 mg at 06/03/18 6712  . atorvastatin (LIPITOR) tablet 40 mg  40 mg Oral QHS Erma Heritage, PA-C   40 mg at 06/02/18 2130  . bisacodyl (DULCOLAX) suppository 10 mg  10 mg Rectal Daily Virl Cagey, MD   10 mg at 06/02/18 0831  . enoxaparin (LOVENOX) injection 40 mg  40 mg Subcutaneous Q24H Reubin Milan, MD   40 mg at 06/02/18 0056  . feeding supplement (BOOST / RESOURCE BREEZE) liquid 1 Container  1 Container Oral TID BM Murlean Iba, MD   1 Container at 06/03/18 0816  . fentaNYL (SUBLIMAZE) injection 75 mcg  75 mcg Intravenous Q2H PRN Aviva Signs, MD   75 mcg at 06/03/18 1223  . insulin aspart (novoLOG) injection 0-9 Units  0-9 Units Subcutaneous Q4H Kathie Dike, MD   2 Units at 06/03/18 1222  . insulin glargine (LANTUS) injection 7 Units  7 Units Subcutaneous QHS Kathie Dike, MD   7 Units at 06/02/18 2130  . ipratropium-albuterol (DUONEB) 0.5-2.5 (3) MG/3ML nebulizer solution 3 mL  3 mL Nebulization Q4H PRN Kathie Dike, MD   3 mL at 05/28/18 0003  . ipratropium-albuterol (DUONEB) 0.5-2.5 (3) MG/3ML nebulizer solution 3 mL  3 mL Nebulization TID Kathie Dike, MD   3 mL at 06/03/18 0802  . losartan (COZAAR) tablet 50 mg  50 mg Oral Daily Bernerd Pho M, PA-C   50 mg at 06/03/18 0815  . MEDLINE mouth rinse  15 mL Mouth Rinse BID Johnson, Clanford L, MD   15 mL at 06/03/18 0815  . metoprolol succinate (TOPROL-XL) 24 hr tablet 25 mg  25 mg Oral Daily Bernerd Pho M, PA-C   25 mg at 06/03/18 0815  . metoprolol tartrate (LOPRESSOR) injection 5 mg  5 mg Intravenous Q6H PRN Erma Heritage, PA-C   5 mg at 05/30/18 0112  . pantoprazole (PROTONIX)  EC tablet 40 mg  40 mg Oral BID Johnson, Clanford L, MD   40 mg at 06/03/18 1155  . phenol (CHLORASEPTIC) mouth spray 1 spray  1 spray Mouth/Throat PRN Wynetta Emery, Clanford L, MD   1 spray at 06/01/18 0201  . prochlorperazine (COMPAZINE) injection 5 mg  5 mg Intravenous Q4H PRN Reubin Milan, MD   5 mg at 05/25/18 2258  . zolpidem (AMBIEN) tablet 5 mg  5 mg Oral QHS PRN Schorr, Rhetta Mura, NP   5 mg at 06/02/18 2213     Discharge Medications: Please see discharge summary for a list of discharge medications.  Relevant Imaging Results:  Relevant Lab Results:   Additional Information SSN [id number], Clydene Pugh, LCSW

## 2018-06-03 NOTE — Transfer of Care (Signed)
Immediate Anesthesia Transfer of Care Note  Patient: Angela Lawson  Procedure(s) Performed: ESOPHAGOGASTRODUODENOSCOPY (EGD) WITH PROPOFOL (N/A ) BIOPSY MALONEY DILATION  Patient Location: PACU  Anesthesia Type:MAC  Level of Consciousness: awake, alert  and oriented  Airway & Oxygen Therapy: Patient Spontanous Breathing  Post-op Assessment: Report given to RN  Post vital signs: Reviewed and stable  Last Vitals:  Vitals Value Taken Time  BP 91/33 06/03/2018  4:10 PM  Temp 37.2 C 06/03/2018  4:10 PM  Pulse 68 06/03/2018  4:12 PM  Resp 23 06/03/2018  4:12 PM  SpO2 93 % 06/03/2018  4:12 PM  Vitals shown include unvalidated device data.  Last Pain:  Vitals:   06/03/18 1552  TempSrc:   PainSc: 0-No pain      Patients Stated Pain Goal: 4 (23/55/73 2202)  Complications: No apparent anesthesia complications

## 2018-06-03 NOTE — Clinical Social Work Note (Signed)
Clinical Social Work Assessment  Patient Details  Name: Angela Lawson MRN: [id number] Date of Birth: 1934-04-16  Date of referral:  06/03/18               Reason for consult:  Facility Placement, Discharge Planning                Permission sought to share information with:  Chartered certified accountant granted to share information::  Yes, Verbal Permission Granted  Name::        Agency::  [facility]  Relationship::     Contact Information:     Housing/Transportation Living arrangements for the past 2 months:  Single Family Home Source of Information:  Patient Patient Interpreter Needed:  None Criminal Activity/Legal Involvement Pertinent to Current Situation/Hospitalization:  No - Comment as needed Significant Relationships:  Adult Children Lives with:  Adult Children Do you feel safe going back to the place where you live?  Yes Need for family participation in patient care:  No (Coment)  Care giving concerns: PT recommends SNF.    Social Worker assessment / plan: Pt is an 84 year old female referred to CSW for SNF rehab placement. Met with pt today to assess. Per pt, she lives with her daughter in [place]. Daughter works during the day. Pt agreeable to SNF. Discussed options. Pt states she does not want to go to [facility]. Pt states she only wants to go to [facility]. Will send referral and follow for dc planning.  Employment status:  Retired Nurse, adult PT Recommendations:  [place] / Referral to community resources:     Patient/Family's Response to care: Pt accepting of care.  Patient/Family's Understanding of and Emotional Response to Diagnosis, Current Treatment, and Prognosis: Pt appears to have a good understanding of diagnosis and treatment recommendations. No emotional distress identified.  Emotional Assessment Appearance:  Appears stated age Attitude/Demeanor/Rapport:  Engaged Affect (typically observed):   Pleasant Orientation:  Oriented to Self, Oriented to Place, Oriented to  Time, Oriented to Situation Alcohol / Substance use:  Not Applicable Psych involvement (Current and /or in the community):  No (Comment)  Discharge Needs  Concerns to be addressed:  Discharge Planning Concerns Readmission within the last 30 days:  No Current discharge risk:  Physical Impairment, Dependent with Mobility Barriers to Discharge:  No Barriers Identified   Shade Flood, LCSW 06/03/2018, 12:25 PM

## 2018-06-03 NOTE — Progress Notes (Signed)
PROGRESS NOTE    Angela Lawson  TTS:[id number]  DOB: 04/27/1934  DOA: 05/24/2018 PCP: Rosita Fire, MD   Brief Admission Hx: 84 year old female with a history of lung cancer coronary artery disease, diastolic heart failure, COPD, presents to the hospital with complaints of worsening shortness of breath as well as abdominal pain, vomiting and distention. Found to have pneumonia as well as bowel obstruction. Started on intravenous antibiotics. General surgery following andNG tube placed for decompression.  In the ED initial vital signs: Temp 98.4 F, HR 102, RR 22, BP 154/63, and SpO2 90 % room air. He received 500 mL normal saline bolus, vancomycin, and cefepime.   MDM/Assessment & Plan:  1. Aspiration Pneumonia. Currently on intravenous antibiotics with Unasyn. Vancomycin discontinued as MRSA screen was negative. Respiratory status appears to be stabilizing. Blood culture: NGTD. Monitor.  Chest xray pending.  2. Elevated troponin. Likely demand ischemia from acute illness. Echocardiogram does not show any wall motion abnormalities. Cardiology is seeing the patient. No plans for further ischemic work-up at this time. 3. Small bowel obstruction versus ileus. General surgery following.  NG removed and clears started 6/25.  Leukocytosis slowly improving.  She is on Unasyn.  Continue current management. 4. Leukocytosis - WBC had been trending down but bumped today, check CXR. Repeat in AM.  5. Parkinson's disease. Patient is on Sinemet.  PT eval requested.  6. Hypernatremia. resolved.  7. Chronic diastolic CHF. Was given 1 dose of IV Lasix on 6/18. Respiratory status currently stable. 8. Type 2  Diabetes. Currently on Lantus and sliding scale insulin.  Blood sugars stable.  Following.  9. Chronic kidney disease stage III. Creatinine currently at baseline. Continue to monitor. 10. Stage IV lung cancer. Patient is being treated at the cancer center and plans to resume  treatment when medically stable. 11. Black tarry stools - Patient states she started having black tarry stool yesterday afternoon. GI has been called in to review for possible upper GI bleed. Protonix IV 80 mg bolus then 80 mg over 72 hours.  Consultants:  Cardiology  General surgery  GI   DVT prophylaxis: Lovenox Code Status: FULL CODE Family Communication: None Disposition Plan: Remain inpatient, GI consult   Consultants:  GI for black tarry stools.    Subjective: Patient states belly pain is better, but has been having black tarry stools x 1 day.  Objective: Vitals:   06/02/18 2131 06/03/18 0423 06/03/18 0803 06/03/18 0814  BP: (!) 117/50 (!) 119/44  (!) 127/50  Pulse: 76 72  84  Resp: 20 20    Temp: 98.4 F (36.9 C) 98.8 F (37.1 C)    TempSrc: Oral Oral    SpO2: 93% 100% (!) 88%   Weight:      Height:        Intake/Output Summary (Last 24 hours) at 06/03/2018 0923 Last data filed at 06/02/2018 1700 Gross per 24 hour  Intake 813.33 ml  Output -  Net 813.33 ml   Filed Weights   05/24/18 2312 05/25/18 0500 05/27/18 0500  Weight: 79.2 kg (174 lb 9.7 oz) 97.2 kg (214 lb 4.6 oz) 76.5 kg (168 lb 10.4 oz)     REVIEW OF SYSTEMS  As per history otherwise all reviewed and reported negative  Exam:  General exam: Alert x 3, calm, cooperative.  Respiratory system: Clear. No increased work of breathing. Cardiovascular system: S1 & S2 heard, RRR. No JVD, murmurs, gallops, clicks or pedal edema. Gastrointestinal system: Abdomen is distended mild tenderness.  Soft, with normal bowel sounds heard. Central nervous system: Alert and oriented. No focal neurological deficits. Extremities: no CCE.  Data Reviewed: Basic Metabolic Panel: Recent Labs  Lab 05/28/18 0450 05/29/18 0641 05/29/18 2029 05/31/18 0550 06/01/18 0505 06/02/18 0630 06/03/18 0426  NA 140 144 143 145 148* 141 140  K 4.1 3.8 4.0 3.6 3.9 3.1* 3.5  CL 101 103 105 103 106 104 104  CO2 28 29 27  31  33* 27 29  GLUCOSE 136* 127* 126* 85 103* 116* 121*  BUN 16 19 21* 27* 23 16 13   CREATININE 1.02* 1.05* 1.00 1.18* 1.12* 1.07* 0.94  CALCIUM 9.2 9.2 9.1 8.8* 8.9 8.3* 8.1*  MG 2.2 2.3 2.3  --   --  1.7 2.1  PHOS  --   --  4.3  --   --   --   --    Liver Function Tests: Recent Labs  Lab 05/28/18 0450 05/29/18 0641 05/29/18 2029  AST 10* 11* 13*  ALT 8* 8* 9*  ALKPHOS 67 68 72  BILITOT 0.7 0.5 0.7  PROT 6.4* 6.3* 6.5  ALBUMIN 2.6* 2.4* 2.6*   No results for input(s): LIPASE, AMYLASE in the last 168 hours. No results for input(s): AMMONIA in the last 168 hours. CBC: Recent Labs  Lab 05/28/18 0450 05/29/18 0641 05/30/18 0610 05/31/18 0550 06/01/18 0505 06/02/18 0630 06/03/18 0426  WBC 22.0* 20.3* 15.7* 14.3* 12.8* 17.3* 11.2*  NEUTROABS 18.2* 16.8* 12.8* 11.1*  --   --  8.6*  HGB 11.0* 10.9* 10.8* 10.6* 10.9* 10.5* 8.8*  HCT 36.0 35.9* 36.2 35.3* 35.8* 35.1* 29.8*  MCV 86.7 86.9 87.4 87.8 87.5 86.9 87.1  PLT 191 207 246 291 316 305 284   Cardiac Enzymes: No results for input(s): CKTOTAL, CKMB, CKMBINDEX, TROPONINI in the last 168 hours. CBG (last 3)  Recent Labs    06/03/18 0017 06/03/18 0422 06/03/18 0737  GLUCAP 123* 123* 123*   Recent Results (from the past 240 hour(s))  Culture, blood (routine x 2)     Status: None   Collection Time: 05/24/18  9:49 PM  Result Value Ref Range Status   Specimen Description BLOOD LEFT HAND  Final   Special Requests   Final    BOTTLES DRAWN AEROBIC ONLY Blood Culture adequate volume   Culture   Final    NO GROWTH 5 DAYS Performed at [facility], [address], [place], [place] [address]    Report Status 05/29/2018 FINAL  Final  Culture, blood (routine x 2)     Status: None   Collection Time: 05/24/18  9:58 PM  Result Value Ref Range Status   Specimen Description BLOOD LEFT HAND  Final   Special Requests   Final    BOTTLES DRAWN AEROBIC ONLY Blood Culture adequate volume   Culture   Final    NO GROWTH 5  DAYS Performed at [facility], [address] [place], [place] [address]    Report Status 05/29/2018 FINAL  Final  Urine culture     Status: Abnormal   Collection Time: 05/24/18 10:22 PM  Result Value Ref Range Status   Specimen Description   Final    URINE, CLEAN CATCH Performed at [facility], [address] [place], [place] [address]    Special Requests   Final    Normal Performed at [facility], [address], [place], [place] [address]    Culture MULTIPLE SPECIES PRESENT, SUGGEST RECOLLECTION (A)  Final   Report Status 05/26/2018  FINAL  Final  MRSA PCR Screening     Status: None   Collection Time: 05/24/18 11:10 PM  Result Value Ref Range Status   MRSA by PCR NEGATIVE NEGATIVE Final    Comment:        The GeneXpert MRSA Assay (FDA approved for NASAL specimens only), is one component of a comprehensive MRSA colonization surveillance program. It is not intended to diagnose MRSA infection nor to guide or monitor treatment for MRSA infections. Performed at [facility], [address], [place], [place] [address]      Studies: Dg Chest [facility] 1 View  Result Date: 06/02/2018 CLINICAL DATA:  Leukocytosis, resected left lung cancer EXAM: PORTABLE CHEST 1 VIEW COMPARISON:  05/26/2018 chest radiograph. FINDINGS: Right subclavian MediPort terminates in the lower third of the SVC. Stable cardiomediastinal silhouette with mild cardiomegaly. No pneumothorax. No pleural effusion. No overt pulmonary edema. Patchy left lung base opacity, not appreciably changed. Emphysema. IMPRESSION: 1. No appreciable change in nonspecific patchy left lung base opacity, which could represent scarring, atelectasis, pneumonia or post treatment change. 2.  Emphysema (ICD10-J43.9). Electronically Signed   By: Ilona Sorrel M.D.   On: 06/02/2018 16:24     Scheduled Meds: . amoxicillin-clavulanate  1 tablet Oral Q12H  . aspirin EC  81 mg Oral Daily  . atorvastatin  40 mg Oral QHS  . bisacodyl  10 mg  Rectal Daily  . enoxaparin (LOVENOX) injection  40 mg Subcutaneous Q24H  . feeding supplement  1 Container Oral TID BM  . insulin aspart  0-9 Units Subcutaneous Q4H  . insulin glargine  7 Units Subcutaneous QHS  . ipratropium-albuterol  3 mL Nebulization TID  . losartan  50 mg Oral Daily  . mouth rinse  15 mL Mouth Rinse BID  . metoprolol succinate  25 mg Oral Daily   Continuous Infusions:  Principal Problem:   HCAP (healthcare-associated pneumonia) Active Problems:   Elevated troponin   CKD (chronic kidney disease) stage 3, GFR 30-59 ml/min (HCC)   Essential hypertension   Type II diabetes mellitus (HCC)   Chronic diastolic CHF (congestive heart failure) (HCC)   CAD (coronary artery disease)   Parkinson's disease (HCC)   Hyponatremia   Abdominal distention   SBO (small bowel obstruction) (HCC)   Partial small bowel obstruction (HCC)   Enteritis   Abdominal pain   Time spent:   Ashok Norris, Student AGACNP  Attending:  Irwin Brakeman, MD, FAAFP [facility] Pager [phone]  If 7PM-7AM, please contact night-coverage [email or web address] Password [facility] 06/03/2018, 8:32 AM    LOS: 10 days

## 2018-06-03 NOTE — Clinical Social Work Note (Signed)
Patient recommended for HHPT. Patient is going home with HHPT.    LCSW signing off.    Marelyn Rouser, Clydene Pugh, LCSW

## 2018-06-03 NOTE — Op Note (Signed)
[facility] Patient Name: Angela Lawson Procedure Date: 06/03/2018 3:12 PM MRN: [id number] Date of Birth: Apr 20, 1934 Attending MD: Norvel Richards , MD CSN: [id number] Age: 84 Admit Type: Inpatient Procedure:                Upper GI endoscopy Indications:              Dysphagia, Melena Providers:                Norvel Richards, MD, Jeanann Lewandowsky. Sharon Seller, RN,                            Aram Candela Referring MD:              Medicines:                Propofol per Anesthesia Complications:            No immediate complications. Estimated Blood Loss:     Estimated blood loss was minimal. Procedure:                Pre-Anesthesia Assessment:                           - Prior to the procedure, a History and Physical                            was performed, and patient medications and                            allergies were reviewed. The patient's tolerance of                            previous anesthesia was also reviewed. The risks                            and benefits of the procedure and the sedation                            options and risks were discussed with the patient.                            All questions were answered, and informed consent                            was obtained. Prior Anticoagulants: The patient                            last took Lovenox (enoxaparin) 1 day prior to the                            procedure. ASA Grade Assessment: IV - A patient                            with severe systemic disease that is a constant  threat to life. After reviewing the risks and                            benefits, the patient was deemed in satisfactory                            condition to undergo the procedure.                           After obtaining informed consent, the endoscope was                            passed under direct vision. Throughout the                            procedure, the patient's blood pressure,  pulse, and                            oxygen saturations were monitored continuously. The                            [id number] ([id number]) scope was introduced through the                            and advanced to the third part of duodenum. The                            upper GI endoscopy was accomplished without                            difficulty. The patient tolerated the procedure                            well. Scope In: 3:55:26 PM Scope Out: 4:02:08 PM Total Procedure Duration: 0 hours 6 minutes 42 seconds  Findings:      Grade I varices were found - 3 columns. Otherwise, esophagus appeared       normal. Scope withdrawn. A 54 French Maloney dilator passed to full       insertion with mild resistance. A look back revealed no apparent       complication      Multiple erosions were found in the gastric antrum. This was biopsied       with a cold forceps for histology. Estimated blood loss was minimal.      The duodenal bulb, second portion of the duodenum and third portion of       the duodenum were normal. Impression:               - Grade I esophageal varices. status post Maloney                            dilation - No further evaluation of varices                            indicated.                           -  Erosive gastropathy. Biopsied.                           - Normal duodenal bulb, second portion of the                            duodenum and third portion of the duodenum.                            Findings in stomach could, in part, be related to                            NG tube trauma. Moderate Sedation:      Moderate (conscious) sedation was personally administered by an       anesthesia professional. The following parameters were monitored: oxygen       saturation, heart rate, blood pressure, respiratory rate, EKG, adequacy       of pulmonary ventilation, and response to care. Total physician       intraservice time was 12 minutes. Recommendation:            - Patient has a contact number available for                            emergencies. The signs and symptoms of potential                            delayed complications were discussed with the                            patient. Return to normal activities tomorrow.                            Written discharge instructions were provided to the                            patient.                           - Mechanical soft diet. Follow-up on pathology.                            Further recommendations to follow. Procedure Code(s):        --- Professional ---                           [phone], Esophagogastroduodenoscopy, flexible,                            transoral; with biopsy, single or multiple Diagnosis Code(s):        --- Professional ---                           I85.00, Esophageal varices without bleeding                           K31.89, Other diseases of  stomach and duodenum                           R13.10, Dysphagia, unspecified                           K92.1, Melena (includes Hematochezia) CPT copyright 2017 American Medical Association. All rights reserved. The codes documented in this report are preliminary and upon coder review may  be revised to meet current compliance requirements. Cristopher Estimable. Arihana Ambrocio, MD Norvel Richards, MD 06/03/2018 4:16:23 PM This report has been signed electronically. Number of Addenda: 0

## 2018-06-03 NOTE — Anesthesia Postprocedure Evaluation (Signed)
Anesthesia Post Note  Patient: Angela Lawson  Procedure(s) Performed: ESOPHAGOGASTRODUODENOSCOPY (EGD) WITH PROPOFOL (N/A ) BIOPSY [name]  Patient location during evaluation: PACU Anesthesia Type: MAC Level of consciousness: awake and alert and oriented Pain management: pain level controlled Vital Signs Assessment: post-procedure vital signs reviewed and stable Respiratory status: spontaneous breathing Cardiovascular status: stable and blood pressure returned to baseline Postop Assessment: no apparent nausea or vomiting Anesthetic complications: no     Last Vitals:  Vitals:   06/03/18 1530 06/03/18 1610  BP:  (!) 91/33  Pulse:  69  Resp: 18 (!) 24  Temp:  37.2 C  SpO2: 94% 92%    Last Pain:  Vitals:   06/03/18 1552  TempSrc:   PainSc: 0-No pain                 Jaqlyn Gruenhagen

## 2018-06-04 DIAGNOSIS — Z7401 Bed confinement status: Secondary | ICD-10-CM | POA: Diagnosis not present

## 2018-06-04 DIAGNOSIS — Z87891 Personal history of nicotine dependence: Secondary | ICD-10-CM | POA: Diagnosis not present

## 2018-06-04 DIAGNOSIS — R630 Anorexia: Secondary | ICD-10-CM | POA: Diagnosis not present

## 2018-06-04 DIAGNOSIS — M6281 Muscle weakness (generalized): Secondary | ICD-10-CM | POA: Diagnosis not present

## 2018-06-04 DIAGNOSIS — J449 Chronic obstructive pulmonary disease, unspecified: Secondary | ICD-10-CM | POA: Diagnosis not present

## 2018-06-04 DIAGNOSIS — M810 Age-related osteoporosis without current pathological fracture: Secondary | ICD-10-CM | POA: Diagnosis not present

## 2018-06-04 DIAGNOSIS — K5900 Constipation, unspecified: Secondary | ICD-10-CM | POA: Diagnosis not present

## 2018-06-04 DIAGNOSIS — I5032 Chronic diastolic (congestive) heart failure: Secondary | ICD-10-CM | POA: Diagnosis not present

## 2018-06-04 DIAGNOSIS — Z9981 Dependence on supplemental oxygen: Secondary | ICD-10-CM | POA: Diagnosis not present

## 2018-06-04 DIAGNOSIS — E871 Hypo-osmolality and hyponatremia: Secondary | ICD-10-CM | POA: Diagnosis not present

## 2018-06-04 DIAGNOSIS — I8510 Secondary esophageal varices without bleeding: Secondary | ICD-10-CM | POA: Diagnosis not present

## 2018-06-04 DIAGNOSIS — K2960 Other gastritis without bleeding: Secondary | ICD-10-CM | POA: Diagnosis not present

## 2018-06-04 DIAGNOSIS — G20 Parkinson's disease: Secondary | ICD-10-CM | POA: Diagnosis not present

## 2018-06-04 DIAGNOSIS — R0602 Shortness of breath: Secondary | ICD-10-CM | POA: Diagnosis not present

## 2018-06-04 DIAGNOSIS — K56609 Unspecified intestinal obstruction, unspecified as to partial versus complete obstruction: Secondary | ICD-10-CM | POA: Diagnosis not present

## 2018-06-04 DIAGNOSIS — M1990 Unspecified osteoarthritis, unspecified site: Secondary | ICD-10-CM | POA: Diagnosis not present

## 2018-06-04 DIAGNOSIS — Z85118 Personal history of other malignant neoplasm of bronchus and lung: Secondary | ICD-10-CM | POA: Diagnosis not present

## 2018-06-04 DIAGNOSIS — I110 Hypertensive heart disease with heart failure: Secondary | ICD-10-CM | POA: Diagnosis not present

## 2018-06-04 DIAGNOSIS — E785 Hyperlipidemia, unspecified: Secondary | ICD-10-CM | POA: Diagnosis not present

## 2018-06-04 DIAGNOSIS — R531 Weakness: Secondary | ICD-10-CM | POA: Diagnosis not present

## 2018-06-04 DIAGNOSIS — R279 Unspecified lack of coordination: Secondary | ICD-10-CM | POA: Diagnosis not present

## 2018-06-04 DIAGNOSIS — E119 Type 2 diabetes mellitus without complications: Secondary | ICD-10-CM | POA: Diagnosis not present

## 2018-06-04 DIAGNOSIS — R5383 Other fatigue: Secondary | ICD-10-CM | POA: Diagnosis not present

## 2018-06-04 DIAGNOSIS — C3490 Malignant neoplasm of unspecified part of unspecified bronchus or lung: Secondary | ICD-10-CM | POA: Diagnosis not present

## 2018-06-04 DIAGNOSIS — K529 Noninfective gastroenteritis and colitis, unspecified: Secondary | ICD-10-CM | POA: Diagnosis not present

## 2018-06-04 DIAGNOSIS — N183 Chronic kidney disease, stage 3 (moderate): Secondary | ICD-10-CM | POA: Diagnosis not present

## 2018-06-04 DIAGNOSIS — I509 Heart failure, unspecified: Secondary | ICD-10-CM | POA: Diagnosis not present

## 2018-06-04 DIAGNOSIS — Z5112 Encounter for antineoplastic immunotherapy: Secondary | ICD-10-CM | POA: Diagnosis not present

## 2018-06-04 DIAGNOSIS — R140 Abdominal distension (gaseous): Secondary | ICD-10-CM | POA: Diagnosis not present

## 2018-06-04 DIAGNOSIS — I252 Old myocardial infarction: Secondary | ICD-10-CM | POA: Diagnosis not present

## 2018-06-04 DIAGNOSIS — E1165 Type 2 diabetes mellitus with hyperglycemia: Secondary | ICD-10-CM | POA: Diagnosis not present

## 2018-06-04 DIAGNOSIS — Z794 Long term (current) use of insulin: Secondary | ICD-10-CM | POA: Diagnosis not present

## 2018-06-04 DIAGNOSIS — R197 Diarrhea, unspecified: Secondary | ICD-10-CM | POA: Diagnosis not present

## 2018-06-04 DIAGNOSIS — Z79899 Other long term (current) drug therapy: Secondary | ICD-10-CM | POA: Diagnosis not present

## 2018-06-04 DIAGNOSIS — I2510 Atherosclerotic heart disease of native coronary artery without angina pectoris: Secondary | ICD-10-CM | POA: Diagnosis not present

## 2018-06-04 DIAGNOSIS — I10 Essential (primary) hypertension: Secondary | ICD-10-CM | POA: Diagnosis not present

## 2018-06-04 DIAGNOSIS — R1312 Dysphagia, oropharyngeal phase: Secondary | ICD-10-CM | POA: Diagnosis not present

## 2018-06-04 DIAGNOSIS — I700 Atherosclerosis of aorta: Secondary | ICD-10-CM | POA: Diagnosis not present

## 2018-06-04 DIAGNOSIS — J189 Pneumonia, unspecified organism: Secondary | ICD-10-CM | POA: Diagnosis not present

## 2018-06-04 DIAGNOSIS — K921 Melena: Secondary | ICD-10-CM | POA: Diagnosis not present

## 2018-06-04 DIAGNOSIS — Z7982 Long term (current) use of aspirin: Secondary | ICD-10-CM | POA: Diagnosis not present

## 2018-06-04 DIAGNOSIS — R498 Other voice and resonance disorders: Secondary | ICD-10-CM | POA: Diagnosis not present

## 2018-06-04 LAB — CBC
HCT: 29.6 % — ABNORMAL LOW (ref 36.0–46.0)
Hemoglobin: 8.8 g/dL — ABNORMAL LOW (ref 12.0–15.0)
MCH: 25.8 pg — ABNORMAL LOW (ref 26.0–34.0)
MCHC: 29.7 g/dL — ABNORMAL LOW (ref 30.0–36.0)
MCV: 86.8 fL (ref 78.0–100.0)
Platelets: 273 10*3/uL (ref 150–400)
RBC: 3.41 MIL/uL — ABNORMAL LOW (ref 3.87–5.11)
RDW: 15.9 % — ABNORMAL HIGH (ref 11.5–15.5)
WBC: 10.7 10*3/uL — ABNORMAL HIGH (ref 4.0–10.5)

## 2018-06-04 LAB — BASIC METABOLIC PANEL
Anion gap: 5 (ref 5–15)
BUN: 10 mg/dL (ref 8–23)
CO2: 27 mmol/L (ref 22–32)
Calcium: 8 mg/dL — ABNORMAL LOW (ref 8.9–10.3)
Chloride: 107 mmol/L (ref 98–111)
Creatinine, Ser: 0.99 mg/dL (ref 0.44–1.00)
GFR calc Af Amer: 60 mL/min — ABNORMAL LOW (ref >60)
GFR calc non Af Amer: 52 mL/min — ABNORMAL LOW (ref >60)
Glucose, Bld: 125 mg/dL — ABNORMAL HIGH (ref 70–99)
Potassium: 3.7 mmol/L (ref 3.5–5.1)
Sodium: 139 mmol/L (ref 135–145)

## 2018-06-04 LAB — GLUCOSE, CAPILLARY
Glucose-Capillary: 132 mg/dL — ABNORMAL HIGH (ref 70–99)
Glucose-Capillary: 161 mg/dL — ABNORMAL HIGH (ref 70–99)
Glucose-Capillary: 166 mg/dL — ABNORMAL HIGH (ref 70–99)
Glucose-Capillary: 192 mg/dL — ABNORMAL HIGH (ref 70–99)

## 2018-06-04 MED ORDER — LANTUS SOLOSTAR 100 UNIT/ML ~~LOC~~ SOPN: 7.0000 [IU] | PEN_INJECTOR | Freq: Every day | SUBCUTANEOUS | 11 refills | Status: AC

## 2018-06-04 MED ORDER — LIDOCAINE-PRILOCAINE 2.5-2.5 % EX CREA: 1.0000 "application " | TOPICAL_CREAM | Freq: Every day | CUTANEOUS | Status: AC | PRN

## 2018-06-04 MED ORDER — AMOXICILLIN-POT CLAVULANATE 875-125 MG PO TABS: 1.0000 | ORAL_TABLET | Freq: Two times a day (BID) | ORAL | 0 refills | Status: AC

## 2018-06-04 MED ORDER — POTASSIUM CHLORIDE ER 10 MEQ PO TBCR: EXTENDED_RELEASE_TABLET | ORAL | 3 refills | Status: AC

## 2018-06-04 MED ORDER — FUROSEMIDE 20 MG PO TABS: 20.0000 mg | ORAL_TABLET | Freq: Every day | ORAL | Status: AC

## 2018-06-04 MED ORDER — SALINE SPRAY 0.65 % NA SOLN: 1.0000 | NASAL | 0 refills | Status: AC | PRN

## 2018-06-04 MED ORDER — ROPINIROLE HCL 0.5 MG PO TABS: 0.5000 mg | ORAL_TABLET | Freq: Every day | ORAL | Status: AC

## 2018-06-04 NOTE — Discharge Instructions (Signed)
Seek medical care or return to ER if symptoms recur, worsen or new problems develop.

## 2018-06-04 NOTE — Addendum Note (Signed)
Addendum  created 06/04/18 1109 by Ollen Bowl, CRNA   Sign clinical note

## 2018-06-04 NOTE — Progress Notes (Signed)
IV removed, WNL. D/C instructions given to pt. Verbalized understanding. Report called and given to staff at [facility]. EMS present to transport patient.

## 2018-06-04 NOTE — Progress Notes (Signed)
Physical Therapy Treatment Patient Details Name: Angela Lawson MRN: [id number] DOB: 06-13-1934 Today's Date: 06/04/2018    History of Present Illness 84 year old female with a history of lung cancer coronary artery disease, diastolic heart failure, COPD, presents to the hospital 05/24/18 with complaints of worsening shortness of breath as well as abdominal pain, vomiting and distention.  Found to have pneumonia as well as bowel obstruction    PT Comments    Angela Lawson is progressing well with acute physical therapy but remains greatly limited by fatigue. She was able to ambulate to the bathroom this session with minimum assist for transfers and gait. She requires external support for balance activities and requires assistance for ADL's like toileting/hygeine activities. She was educated on benefits of participating in therapy at a skilled facility to improve her activity tolerance, strength, endurance, and balance to improve safety before returning home. She will benefit from skilled PT to address current impairments and from follow up PT at below venue to improve independence with mobility. Acute PT will follow throughout her hospital stay.    Follow Up Recommendations  Supervision/Assistance - 24 hour;SNF     Equipment Recommendations  None recommended by PT    Recommendations for Other Services       Precautions / Restrictions Precautions Precautions: Fall Precaution Comments: 2L/min O2 via nasal cannula Restrictions Weight Bearing Restrictions: No    Mobility  Bed Mobility Overal bed mobility: Needs Assistance Bed Mobility: Supine to Sit     Supine to sit: Min assist;HOB elevated     General bed mobility comments: verbal cues for seqeuncing and min assist to raise trunk up from sidelying  Transfers Overall transfer level: Needs assistance Equipment used: Rolling walker (2 wheeled) Transfers: Sit to/from Stand Sit to Stand: Min assist         General transfer  comment: min assist to initiate stand from edge of bed and from toilet, cues for safe hand placement on RW and grab bars  Ambulation/Gait Ambulation/Gait assistance: Min assist Gait Distance (Feet): 12 Feet(2 bouts) Assistive device: Rolling walker (2 wheeled) Gait Pattern/deviations: Step-to pattern;Trunk flexed;Decreased step length - left;Decreased step length - right Gait velocity: slow indicative of falls and household ambulation   General Gait Details: slow gait, min assist with verbal cues and manual assistance to manage RW safely, cues to navigate doorway and turns,        Balance Overall balance assessment: Needs assistance Sitting-balance support: Feet supported;No upper extremity supported Sitting balance-Leahy Scale: Fair     Standing balance support: During functional activity;Bilateral upper extremity supported Standing balance-Leahy Scale: Fair Standing balance comment: relies on arm support, requires assistance for toileting hygeine due to balance limitations      Cognition Arousal/Alertness: Awake/alert Behavior During Therapy: WFL for tasks assessed/performed Overall Cognitive Status: Within Functional Limits for tasks assessed                Pertinent Vitals/Pain Pain Assessment: No/denies pain           PT Goals (current goals can now be found in the care plan section) Acute Rehab PT Goals Patient Stated Goal: to go home PT Goal Formulation: With patient Time For Goal Achievement: 06/16/18 Potential to Achieve Goals: Good Progress towards PT goals: Progressing toward goals    Frequency    Min 3X/week      PT Plan Current plan remains appropriate       AM-PAC PT "6 Clicks" Daily Activity  Outcome Measure  Difficulty turning over  in bed (including adjusting bedclothes, sheets and blankets)?: A Lot Difficulty moving from lying on back to sitting on the side of the bed? : Unable Difficulty sitting down on and standing up from a chair  with arms (e.g., wheelchair, bedside commode, etc,.)?: Unable Help needed moving to and from a bed to chair (including a wheelchair)?: A Little Help needed walking in hospital room?: A Little Help needed climbing 3-5 steps with a railing? : Total 6 Click Score: 11    End of Session Equipment Utilized During Treatment: Gait belt Activity Tolerance: Patient limited by fatigue Patient left: in chair;with call bell/phone within reach;with chair alarm set Nurse Communication: Mobility status PT Visit Diagnosis: Unsteadiness on feet (R26.81);Pain     Time: 9758-8325 PT Time Calculation (min) (ACUTE ONLY): 29 min  Charges:  $Therapeutic Activity: 23-37 mins                    G Codes:       Kipp Brood, PT, DPT Physical Therapist with [facility]  06/04/2018 1:57 PM

## 2018-06-04 NOTE — Progress Notes (Signed)
PROGRESS NOTE    Angela Lawson  JKD:[id number]  DOB: 06/04/1934  DOA: 05/24/2018 PCP: Rosita Fire, MD   Brief Admission Hx: 84 year old female with a history of lung cancer coronary artery disease, diastolic heart failure, COPD, presents to the hospital with complaints of worsening shortness of breath as well as abdominal pain, vomiting and distention. Found to have pneumonia as well as bowel obstruction. Started on intravenous antibiotics. General surgery following andNG tube placed for decompression. Complained of black tarry stools 06/03/18. GI consulted.  In the ED initial vital signs: Temp 98.4 F, HR 102, RR 22, BP 154/63, and SpO2 90 % room air. He received 500 mL normal saline bolus, vancomycin, and cefepime.     MDM/Assessment & Plan:   1. AspirationPneumonia. Currently on intravenous antibiotics with Unasyn. Vancomycin discontinued as MRSA screen was negative. Respiratory status appears to be stabilizing. Blood culture: NGTD. Monitor.Chest xray pending. 2. Elevated troponin. Likely demand ischemia from acute illness. Echocardiogram does not show any wall motion abnormalities. Cardiology is seeing the patient. No plans for further ischemic work-up at this time. 3. Small bowel obstruction versus ileus. General surgery following. NG removed and clears started 6/25. Leukocytosis slowly improving. She is on Unasyn. Continue current management. 4. Leukocytosis - WBC had been trending down but bumped today, check CXR. Repeat in AM. 5. Parkinson's disease. Patient is on Sinemet.PT eval requested. 6. Hypernatremia.resolved. 7. Chronic diastolic CHF. Was given 1 dose of IV Lasix on 6/18. Respiratory status currently stable. 8. Type 2Diabetes. Currently on Lantus and sliding scale insulin. Blood sugars stable.Following. 9. Chronic kidney disease stage III. Creatinine currently at baseline. Continue to monitor. 10. Stage IV lung cancer. Patient is  being treated at the cancer center and plans to resume treatment when medically stable. 11. Black tarry stools - Patient states she started having black tarry stool yesterday afternoon. GI has been called in to review for possible upper GI bleed. GI performed upper endoscopy showing stable grade one esophageal varices without bleeding, and erosive gastropathy that was biopsied by Dr. Gala Romney. He suggest mechanical soft diet and outpatient follow up.  Consultants:  Cardiology  General surgery  GI; Dr. Gala Romney.  DVT prophylaxis: Lovenox Code Status: FULL CODE Family Communication: None Disposition Plan: Seeking SNF for rehab.   Consultants:  GI for black tarry stools.   Subjective: Patient states she is feeling much better and continues states she would like a SNF Rehab.  Objective: Vitals:   06/03/18 2004 06/03/18 2112 06/04/18 0614 06/04/18 0739  BP:  (!) 127/52 (!) 136/51   Pulse:  78 76   Resp:      Temp:  98.1 F (36.7 C) 98.5 F (36.9 C)   TempSrc:  Oral Oral   SpO2: 97% 95% 98% 96%  Weight:      Height:        Intake/Output Summary (Last 24 hours) at 06/04/2018 0804 Last data filed at 06/03/2018 1700 Gross per 24 hour  Intake 780 ml  Output 0 ml  Net 780 ml   Filed Weights   05/24/18 2312 05/25/18 0500 05/27/18 0500  Weight: 79.2 kg (174 lb 9.7 oz) 97.2 kg (214 lb 4.6 oz) 76.5 kg (168 lb 10.4 oz)     REVIEW OF SYSTEMS  As per history otherwise all reviewed and reported negative  Exam:  General exam: Alert x 3, calm, cooperative   Respiratory system: Clear. No increased work of breathing. Cardiovascular system: S1 & S2 heard, RRR. No JVD, murmurs, gallops, clicks  or pedal edema. Gastrointestinal system: Abdomen is distended mild tenderness. Soft, with normal bowel sounds heard. Central nervous system: Alert and oriented. No focal neurological deficits. Extremities: no CCE.  Data Reviewed: Basic Metabolic Panel: Recent Labs  Lab 05/29/18 0641  05/29/18 2029 05/31/18 0550 06/01/18 0505 06/02/18 0630 06/03/18 0426 06/04/18 0513  NA 144 143 145 148* 141 140 139  K 3.8 4.0 3.6 3.9 3.1* 3.5 3.7  CL 103 105 103 106 104 104 107  CO2 29 27 31  33* 27 29 27   GLUCOSE 127* 126* 85 103* 116* 121* 125*  BUN 19 21* 27* 23 16 13 10   CREATININE 1.05* 1.00 1.18* 1.12* 1.07* 0.94 0.99  CALCIUM 9.2 9.1 8.8* 8.9 8.3* 8.1* 8.0*  MG 2.3 2.3  --   --  1.7 2.1  --   PHOS  --  4.3  --   --   --   --   --    Liver Function Tests: Recent Labs  Lab 05/29/18 0641 05/29/18 2029  AST 11* 13*  ALT 8* 9*  ALKPHOS 68 72  BILITOT 0.5 0.7  PROT 6.3* 6.5  ALBUMIN 2.4* 2.6*   No results for input(s): LIPASE, AMYLASE in the last 168 hours. No results for input(s): AMMONIA in the last 168 hours. CBC: Recent Labs  Lab 05/29/18 0641 05/30/18 0610 05/31/18 0550 06/01/18 0505 06/02/18 0630 06/03/18 0426 06/04/18 0513  WBC 20.3* 15.7* 14.3* 12.8* 17.3* 11.2* 10.7*  NEUTROABS 16.8* 12.8* 11.1*  --   --  8.6*  --   HGB 10.9* 10.8* 10.6* 10.9* 10.5* 8.8* 8.8*  HCT 35.9* 36.2 35.3* 35.8* 35.1* 29.8* 29.6*  MCV 86.9 87.4 87.8 87.5 86.9 87.1 86.8  PLT 207 246 291 316 305 284 273   Cardiac Enzymes: No results for input(s): CKTOTAL, CKMB, CKMBINDEX, TROPONINI in the last 168 hours. CBG (last 3)  Recent Labs    06/03/18 1618 06/03/18 2021 06/04/18 0004  GLUCAP 98 157* 132*   No results found for this or any previous visit (from the past 240 hour(s)).   Studies: Dg Chest Port 1 View  Result Date: 06/02/2018 CLINICAL DATA:  Leukocytosis, resected left lung cancer EXAM: PORTABLE CHEST 1 VIEW COMPARISON:  05/26/2018 chest radiograph. FINDINGS: Right subclavian MediPort terminates in the lower third of the SVC. Stable cardiomediastinal silhouette with mild cardiomegaly. No pneumothorax. No pleural effusion. No overt pulmonary edema. Patchy left lung base opacity, not appreciably changed. Emphysema. IMPRESSION: 1. No appreciable change in nonspecific  patchy left lung base opacity, which could represent scarring, atelectasis, pneumonia or post treatment change. 2.  Emphysema (ICD10-J43.9). Electronically Signed   By: Ilona Sorrel M.D.   On: 06/02/2018 16:24     Scheduled Meds: . amoxicillin-clavulanate  1 tablet Oral Q12H  . aspirin EC  81 mg Oral Daily  . atorvastatin  40 mg Oral QHS  . bisacodyl  10 mg Rectal Daily  . enoxaparin (LOVENOX) injection  40 mg Subcutaneous Q24H  . feeding supplement  1 Container Oral TID BM  . insulin aspart  0-9 Units Subcutaneous Q4H  . insulin glargine  7 Units Subcutaneous QHS  . ipratropium-albuterol  3 mL Nebulization TID  . losartan  50 mg Oral Daily  . mouth rinse  15 mL Mouth Rinse BID  . metoprolol succinate  25 mg Oral Daily  . pantoprazole  40 mg Oral BID   Continuous Infusions:  Principal Problem:   HCAP (healthcare-associated pneumonia) Active Problems:   Elevated troponin  CKD (chronic kidney disease) stage 3, GFR 30-59 ml/min (HCC)   Essential hypertension   Type II diabetes mellitus (HCC)   Chronic diastolic CHF (congestive heart failure) (HCC)   CAD (coronary artery disease)   Parkinson's disease (HCC)   Hyponatremia   Abdominal distention   SBO (small bowel obstruction) (HCC)   Partial small bowel obstruction (HCC)   Enteritis   Abdominal pain   Black tarry stools   Pneumonitis   Time spent:   Ashok Norris, Student AGACNP  Attending:  Irwin Brakeman, MD, FAAFP [facility] Pager [phone]  If 7PM-7AM, please contact night-coverage [email or web address] Password [email or web address] 06/04/2018, 8:04 AM    LOS: 11 days

## 2018-06-04 NOTE — Care Management Important Message (Signed)
Important Message  Patient Details  Name: Angela Lawson MRN: [id number] Date of Birth: [birth year]   Medicare Important Message Given:  Yes    [name] 06/04/2018, 11:50 AM

## 2018-06-04 NOTE — Discharge Summary (Signed)
Physician Discharge Summary  [name] FAO:[id number] DOB: February 25, 1934 DOA: 05/24/2018  PCP: Rosita Fire, MD  Admit date: 05/24/2018 Discharge date: 06/04/2018  Admitted From: Home  Disposition: [facility]  Recommendations for Outpatient Follow-up:  1. Follow up with Oncologist in 1 weeks 2. Please obtain BMP/CBC in one week 3. Please monitor blood glucose 3 times per day  Discharge Condition: STABLE  CODE STATUS: FULL    Brief Hospitalization Summary: Please see all hospital notes, images, labs for full details of the hospitalization.  HPI: Angela Lawson is a 84 y.o. female with medical history significant of lung adenocarcinoma with partial left lung resection, anginal pain, osteoarthritis, back pain, history of nonobstructive CAD, history of NSTEMI in May 2016, Takotsubo cardiomyopathy, chronic diastolic CHF, COPD, diverticulosis, hyperlipidemia, hypertension, history of ventral hernia who is coming to the emergency department due to complaints of progressively worse shortness of breath for several days and worsening of abdominal pain intensity with vomiting of abdominal distention, nausea and multiple episodes of emesis since Sunday.  She denies constipation, melena or hematochezia.  Her last BM was 2 days ago.  She denies fever, but complains of chills and fatigue.  No headache, sore throat, hemoptysis, chest pain, palpitations, dizziness, diaphoresis, pitting edema lower extremities.  She denies dysuria, frequency or hematuria.  Denies pruritus or skin rashes.  No polyuria, polydipsia or polyphagia.  ED Course: Initial vital signs temperature 98.4 F, pulse 102, respirations 22, blood pressure 154/63 mmHg and O2 sat 90%.  He received a 500 mL NS bolus, vancomycin and cefepime in the ED.  Urinalysis shows a cloudy color, with increased specific gravity, small hemoglobinuria, large leukocyte esterase, negative nitrites, but significant pyuria, but rare bacteria.  White  count was 21.6, hemoglobin 12.1 g/dL and platelets 178.  Sodium 132, potassium 4.5, chloride 93, CO2 29 mmol S/L.  BUN was 12, creatinine 1.16, glucose 158 and calcium 9.3 mg/dL.  LFTs were unremarkable.  Lipase was normal.  Troponin was elevated at 0.28 mg/dL.   Imaging: Her 2 view chest radiograph did not show any acute cardiopulmonary pathology.  However, CTA chest show increasing groundglass opacification and patchy areas of consolidation in the dependent left lung which may represent progressive posttreatment changes with superimposed pneumonitis.  There was no PE or mass.  Please see images and full radiology report for further detail.   1. AspirationPneumonia. Treated with intravenous antibiotics with Unasyn. Vancomycin discontinued as MRSA screen was negative. Respiratory status much improved. Blood culture: NGTD. 2. Elevated troponin. Likely demand ischemia from acute illness. Echocardiogram does not show any wall motion abnormalities. Cardiology saw the patient. No plans for further ischemic work-up at this time. 3. Small bowel obstruction versus ileus.  Resolved now. General surgery followed and signed off. NG removed and diet has been advanced. Leukocytosis improving. She is on oral augmentin to continue for 3 more days.  4. Leukocytosis - WBC had been trending down but bumped today, check CXR. Repeat in AM. 5. Parkinson's disease. Patient is on Sinemet.PT recommending SNF.  6. Hypernatremia.resolved. 7. Chronic diastolic CHF. Was given 1 dose of IV Lasix on 6/18.Respiratory status currently stable.  8. Type 2Diabetes. Treated with Lantus and sliding scale insulin. Blood sugars stable.Following. 9. Chronic kidney disease stage III. Creatinine currently at baseline. Continue to monitor. 10. Stage IV lung cancer. Patient is being treated at the cancer center and plans to resume treatment when medically stable. 11. Black tarry stools - Patient states she  started having black tarry  stool yesterday afternoon. GI has been called in to review for possible upper GI bleed. GI performed upper endoscopy showing stable grade one esophageal varices without bleeding, and erosive gastropathy that was biopsied by Dr. Gala Romney. He suggested mechanical soft diet and outpatient follow up.  Consultants:  Cardiology  General surgery  GI; Dr. Gala Romney.  DVT prophylaxis:Lovenox Code Status:FULL CODE Family Communication:sister Disposition Plan:SNF   Consultants:  GI for black tarry stools.  Discharge Diagnoses:  Principal Problem:   HCAP (healthcare-associated pneumonia) Active Problems:   Elevated troponin   CKD (chronic kidney disease) stage 3, GFR 30-59 ml/min (HCC)   Essential hypertension   Type II diabetes mellitus (HCC)   Chronic diastolic CHF (congestive heart failure) (HCC)   CAD (coronary artery disease)   Parkinson's disease (HCC)   Hyponatremia   Abdominal distention   SBO (small bowel obstruction) (HCC)   Partial small bowel obstruction (HCC)   Enteritis   Abdominal pain   Black tarry stools   Pneumonitis  Discharge Instructions: Discharge Instructions    Increase activity slowly   Complete by:  As directed      Allergies as of 06/04/2018      Reactions   Lisinopril Cough      Medication List    STOP taking these medications   FISH OIL PO   linagliptin 5 MG Tabs tablet Commonly known as:  TRADJENTA   loratadine 10 MG tablet Commonly known as:  CLARITIN   meclizine 25 MG tablet Commonly known as:  ANTIVERT   pyridoxine 100 MG tablet Commonly known as:  B-6   traMADol 50 MG tablet Commonly known as:  ULTRAM   TRELEGY ELLIPTA 100-62.5-25 MCG/INH Aepb Generic drug:  Fluticasone-Umeclidin-Vilant   vitamin B-12 500 MCG tablet Commonly known as:  CYANOCOBALAMIN   zolpidem 5 MG tablet Commonly known as:  AMBIEN     TAKE these medications   alendronate 70 MG tablet Commonly known as:   FOSAMAX Take 70 mg by mouth every Friday.   amoxicillin-clavulanate 875-125 MG tablet Commonly known as:  AUGMENTIN Take 1 tablet by mouth every 12 (twelve) hours for 3 days. Start taking on:  06/05/2018   aspirin EC 81 MG tablet Take 81 mg by mouth daily.   atorvastatin 40 MG tablet Commonly known as:  LIPITOR Take 40 mg by mouth at bedtime.   B-D ULTRAFINE III SHORT PEN 31G X 8 MM Misc Generic drug:  Insulin Pen Needle See admin instructions.   benzonatate 200 MG capsule Commonly known as:  TESSALON TAKE 1 CAPSULE BY MOUTH THREE TIMES A DAY AS NEEDED   buPROPion 150 MG 12 hr tablet Commonly known as:  WELLBUTRIN SR Take 150 mg by mouth 2 (two) times daily.   carbidopa-levodopa 25-100 MG tablet Commonly known as:  SINEMET IR Take 1 tablet by mouth 2 (two) times daily.   cholecalciferol 1000 units tablet Commonly known as:  VITAMIN D Take 1,000 Units by mouth daily.   furosemide 20 MG tablet Commonly known as:  LASIX Take 1 tablet (20 mg total) by mouth daily. Start taking on:  06/07/2018 What changed:    medication strength  how much to take  additional instructions  These instructions start on 06/07/2018. If you are unsure what to do until then, ask your doctor or other care provider.   gabapentin 300 MG capsule Commonly known as:  NEURONTIN Take 600 mg by mouth at bedtime.   Garlic 681 MG Tabs Take 300 mg by mouth daily.  ipratropium-albuterol 0.5-2.5 (3) MG/3ML Soln Commonly known as:  DUONEB Inhale 3 mLs into the lungs every 6 (six) hours as needed (for wheezing/shortness of breath).   LANTUS SOLOSTAR 100 UNIT/ML Solostar Pen Generic drug:  Insulin Glargine Inject 7 Units into the skin at bedtime. What changed:  how much to take   lidocaine-prilocaine cream Commonly known as:  EMLA Apply 1 application topically daily as needed.   losartan 50 MG tablet Commonly known as:  COZAAR Take 50 mg by mouth daily.   metoprolol succinate 25 MG 24 hr  tablet Commonly known as:  TOPROL-XL Take 1 tablet (25 mg total) by mouth daily.   mometasone-formoterol 200-5 MCG/ACT Aero Commonly known as:  DULERA Inhale 2 puffs into the lungs 2 (two) times daily.   nitroGLYCERIN 0.4 MG SL tablet Commonly known as:  NITROSTAT Place 0.4 mg under the tongue every 5 (five) minutes as needed for chest pain.   omeprazole 20 MG capsule Commonly known as:  PRILOSEC Take 20 mg by mouth 2 (two) times daily.   ondansetron 4 MG disintegrating tablet Commonly known as:  ZOFRAN ODT Take 1 tablet (4 mg total) by mouth every 8 (eight) hours as needed for nausea or vomiting.   OXYGEN Inhale 3 L into the lungs continuous.   polyethylene glycol powder powder Commonly known as:  GLYCOLAX/MIRALAX Take 17 g by mouth daily as needed.   potassium chloride 10 MEQ tablet Commonly known as:  KLOR-CON 10 TAKE 1 TABLET (10 MEQ TOTAL) BY MOUTH DAILY. Start taking on:  06/07/2018 What changed:    See the new instructions.  These instructions start on 06/07/2018. If you are unsure what to do until then, ask your doctor or other care provider.   PROAIR HFA 108 (90 Base) MCG/ACT inhaler Generic drug:  albuterol Inhale 2 puffs into the lungs every 4 (four) hours as needed for wheezing or shortness of breath.   rOPINIRole 0.5 MG tablet Commonly known as:  REQUIP Take 1 tablet (0.5 mg total) by mouth at bedtime. What changed:  when to take this   sodium chloride 0.65 % Soln nasal spray Commonly known as:  OCEAN Place 1 spray into both nostrils every 3 (three) hours as needed for congestion.   TECENTRIQ IV Inject into the vein. Every 3 weeks      Follow-up Information    Rosita Fire, MD. Schedule an appointment as soon as possible for a visit in 2 week(s).   Specialty:  Internal Medicine Contact information: [place] [address] [phone]        Derek Jack, MD. Schedule an appointment as soon as possible for a  visit in 1 week(s).   Specialty:  Hematology Contact information: [place] [address] [phone]        Daneil Dolin, MD. Schedule an appointment as soon as possible for a visit in 1 month(s).   Specialty:  Gastroenterology Contact information: [place] [address] [phone]          Allergies  Allergen Reactions  . Lisinopril Cough   Allergies as of 06/04/2018      Reactions   Lisinopril Cough      Medication List    STOP taking these medications   FISH OIL PO   linagliptin 5 MG Tabs tablet Commonly known as:  TRADJENTA   loratadine 10 MG tablet Commonly known as:  CLARITIN   meclizine 25 MG tablet Commonly known as:  [employer]  pyridoxine 100 MG tablet Commonly known as:  B-6   traMADol 50 MG tablet Commonly known as:  ULTRAM   TRELEGY ELLIPTA 100-62.5-25 MCG/INH Aepb Generic drug:  Fluticasone-Umeclidin-Vilant   vitamin B-12 500 MCG tablet Commonly known as:  CYANOCOBALAMIN   zolpidem 5 MG tablet Commonly known as:  AMBIEN     TAKE these medications   alendronate 70 MG tablet Commonly known as:  FOSAMAX Take 70 mg by mouth every Friday.   amoxicillin-clavulanate 875-125 MG tablet Commonly known as:  AUGMENTIN Take 1 tablet by mouth every 12 (twelve) hours for 3 days. Start taking on:  06/05/2018   aspirin EC 81 MG tablet Take 81 mg by mouth daily.   atorvastatin 40 MG tablet Commonly known as:  LIPITOR Take 40 mg by mouth at bedtime.   B-D ULTRAFINE III SHORT PEN 31G X 8 MM Misc Generic drug:  Insulin Pen Needle See admin instructions.   benzonatate 200 MG capsule Commonly known as:  TESSALON TAKE 1 CAPSULE BY MOUTH THREE TIMES A DAY AS NEEDED   buPROPion 150 MG 12 hr tablet Commonly known as:  WELLBUTRIN SR Take 150 mg by mouth 2 (two) times daily.   carbidopa-levodopa 25-100 MG tablet Commonly known as:  SINEMET IR Take 1 tablet by mouth 2 (two) times daily.   cholecalciferol 1000  units tablet Commonly known as:  VITAMIN D Take 1,000 Units by mouth daily.   furosemide 20 MG tablet Commonly known as:  LASIX Take 1 tablet (20 mg total) by mouth daily. Start taking on:  06/07/2018 What changed:    medication strength  how much to take  additional instructions  These instructions start on 06/07/2018. If you are unsure what to do until then, ask your doctor or other care provider.   gabapentin 300 MG capsule Commonly known as:  NEURONTIN Take 600 mg by mouth at bedtime.   Garlic 270 MG Tabs Take 300 mg by mouth daily.   ipratropium-albuterol 0.5-2.5 (3) MG/3ML Soln Commonly known as:  DUONEB Inhale 3 mLs into the lungs every 6 (six) hours as needed (for wheezing/shortness of breath).   LANTUS SOLOSTAR 100 UNIT/ML Solostar Pen Generic drug:  Insulin Glargine Inject 7 Units into the skin at bedtime. What changed:  how much to take   lidocaine-prilocaine cream Commonly known as:  EMLA Apply 1 application topically daily as needed.   losartan 50 MG tablet Commonly known as:  COZAAR Take 50 mg by mouth daily.   metoprolol succinate 25 MG 24 hr tablet Commonly known as:  TOPROL-XL Take 1 tablet (25 mg total) by mouth daily.   mometasone-formoterol 200-5 MCG/ACT Aero Commonly known as:  DULERA Inhale 2 puffs into the lungs 2 (two) times daily.   nitroGLYCERIN 0.4 MG SL tablet Commonly known as:  NITROSTAT Place 0.4 mg under the tongue every 5 (five) minutes as needed for chest pain.   omeprazole 20 MG capsule Commonly known as:  PRILOSEC Take 20 mg by mouth 2 (two) times daily.   ondansetron 4 MG disintegrating tablet Commonly known as:  ZOFRAN ODT Take 1 tablet (4 mg total) by mouth every 8 (eight) hours as needed for nausea or vomiting.   OXYGEN Inhale 3 L into the lungs continuous.   polyethylene glycol powder powder Commonly known as:  GLYCOLAX/MIRALAX Take 17 g by mouth daily as needed.   potassium chloride 10 MEQ tablet Commonly  known as:  KLOR-CON 10 TAKE 1 TABLET (10 MEQ TOTAL) BY MOUTH DAILY. Start  taking on:  06/07/2018 What changed:    See the new instructions.  These instructions start on 06/07/2018. If you are unsure what to do until then, ask your doctor or other care provider.   PROAIR HFA 108 (90 Base) MCG/ACT inhaler Generic drug:  albuterol Inhale 2 puffs into the lungs every 4 (four) hours as needed for wheezing or shortness of breath.   rOPINIRole 0.5 MG tablet Commonly known as:  REQUIP Take 1 tablet (0.5 mg total) by mouth at bedtime. What changed:  when to take this   sodium chloride 0.65 % Soln nasal spray Commonly known as:  OCEAN Place 1 spray into both nostrils every 3 (three) hours as needed for congestion.   TECENTRIQ IV Inject into the vein. Every 3 weeks       Procedures/Studies: Ct Abdomen Pelvis Wo Contrast  Result Date: 05/22/2018 CLINICAL DATA:  Abdominal pain and diarrhea. Per previous CT reports, history of lung cancer status post immunotherapy. EXAM: CT ABDOMEN AND PELVIS WITHOUT CONTRAST TECHNIQUE: Multidetector CT imaging of the abdomen and pelvis was performed following the standard protocol without IV contrast. COMPARISON:  None. FINDINGS: Lower chest: Again noted is the reticular ground-glass opacity at the LEFT lung base, incompletely imaged, similar to previous studies, described as post treatment changes on previous exams. RIGHT lung base is clear. Hepatobiliary: No focal liver abnormality is seen. Status post cholecystectomy. No biliary dilatation. Pancreas: Unremarkable. No pancreatic ductal dilatation or surrounding inflammatory changes. Spleen: Normal in size without focal abnormality. Adrenals/Urinary Tract: Adrenal glands appear normal. Kidneys are unremarkable without mass, stone or hydronephrosis. No ureteral or bladder calculi identified. Bladder appears normal. Stomach/Bowel: No dilated large or small bowel loops. Fairly extensive diverticulosis within the sigmoid  and lower descending colon, but no focal inflammatory change to suggest acute diverticulitis. No bowel wall thickening or evidence of bowel wall inflammation in the abdomen or pelvis. Stomach is unremarkable, partially decompressed. Vascular/Lymphatic: Aortic atherosclerosis. No enlarged abdominal or pelvic lymph nodes. Reproductive: Presumed hysterectomy.  No adnexal mass or free fluid. Other: No free fluid or abscess collection. No free intraperitoneal air. Musculoskeletal: Degenerative changes throughout the thoracolumbar spine, mild to moderate in degree. No acute or suspicious osseous finding. IMPRESSION: 1. No acute findings within the abdomen or pelvis. No evidence of metastasis. No bowel obstruction or evidence of bowel wall inflammation. No evidence of acute solid organ abnormality. No free fluid or abscess. 2. Colonic diverticulosis without evidence of acute diverticulitis. 3. Aortic atherosclerosis. Electronically Signed   By: Franki Cabot M.D.   On: 05/22/2018 17:53   Dg Chest 2 View  Result Date: 05/24/2018 CLINICAL DATA:  84 year old female with shortness of breath and abdominal pain for 4 days. Treated left lung cancer. EXAM: CHEST - 2 VIEW COMPARISON:  Chest radiograph 05/22/2018. Restaging CT chest abdomen and pelvis 03/15/2018, and earlier. FINDINGS: Upright AP and lateral views of the chest. Chronic architectural distortion and volume loss in the left lower lung. Stable lung volumes and mediastinal contours. Stable right chest porta cath. Calcified aortic atherosclerosis. No pneumothorax, pulmonary edema, pleural effusion or acute pulmonary opacity. Stable visualized osseous structures. Negative visible bowel gas pattern. IMPRESSION: Stable post treatment appearance of the chest. No new cardiopulmonary abnormality. Electronically Signed   By: Genevie Ann M.D.   On: 05/24/2018 19:23   Dg Chest 2 View  Result Date: 05/22/2018 CLINICAL DATA:  abd pain, N/V/D, pt having lung cancer treatments now  EXAM: CHEST - 2 VIEW COMPARISON:  Chest x-rays dated 02/05/2018  and 02/04/2018. FINDINGS: Stable volume loss in the LEFT hemithorax with basilar pleuroparenchymal scarring. No new lung findings. No pneumothorax seen. Heart size and mediastinal contours are stable. RIGHT chest wall Port-A-Cath is stable in position with tip at the level of the mid SVC. No acute or suspicious osseous finding. IMPRESSION: Stable chest x-ray. No active cardiopulmonary disease. No evidence of pneumonia or pulmonary edema. Electronically Signed   By: Franki Cabot M.D.   On: 05/22/2018 18:07   Dg Abd 1 View  Result Date: 05/29/2018 CLINICAL DATA:  Bowel obstruction EXAM: ABDOMEN - 1 VIEW COMPARISON:  05/26/2018, CT 05/25/2018 FINDINGS: Persistent gaseous enlargement of central small bowel loops, measuring up to 4.3 cm slight increase compared to prior. Contrast material within the right hemiabdomen. Residual dilute contrast within the urinary bladder. Partially visualized esophageal tube tip overlies the gastric fundus with side port projecting over the distal esophagus. IMPRESSION: Slight increased gaseous dilatation of central small bowel loops consistent with bowel obstruction. Side port tip of the esophageal tube projects over distal esophagus, suggest further advancement for more optimal positioning. Electronically Signed   By: Donavan Foil M.D.   On: 05/29/2018 22:26   Ct Angio Chest Pe W And/or Wo Contrast  Result Date: 05/24/2018 CLINICAL DATA:  84 y/o F; shortness of breath and abdominal pain for 4 days. Worsening symptoms. History of lung cancer post resection. EXAM: CT ANGIOGRAPHY CHEST WITH CONTRAST TECHNIQUE: Multidetector CT imaging of the chest was performed using the standard protocol during bolus administration of intravenous contrast. Multiplanar CT image reconstructions and MIPs were obtained to evaluate the vascular anatomy. CONTRAST:  80 cc Isovue 370 COMPARISON:  03/15/2018 CT chest. FINDINGS:  Cardiovascular: Satisfactory opacification of the pulmonary arteries to the segmental level. No evidence of pulmonary embolism. Normal heart size. No pericardial effusion. Moderate calcific atherosclerosis of the coronary arteries and aorta. Mediastinum/Nodes: No enlarged mediastinal, hilar, or axillary lymph nodes. Thyroid gland, trachea, and esophagus demonstrate no significant findings. Small hiatal hernia. Lungs/Pleura: Stable postsurgical changes the left inferior hilum and lung base. Stable thick rimmed extrapleural cavity the left lower as ago esophageal recess prior surgery. Increasing ground-glass opacification with patchy areas of consolidation of the dependent left lung may represent progressive posttreatment changes or superimposed pneumonitis. No pleural effusion or interval pneumothorax. There are several small 3-4 mm nodules at the periphery of the lungs which are stable from prior study. Stable scarring in the right lung apex. Stable emphysema. Upper Abdomen: No acute abnormality. Musculoskeletal: Multiple chronic bilateral rib deformities from prior surgery and/or trauma. No acute fracture identified. Review of the MIP images confirms the above findings. IMPRESSION: 1. No pulmonary embolus identified. 2. Increasing ground-glass opacification and patchy areas of consolidation in the dependent left lung may represent progressive posttreatment changes or superimposed pneumonitis. No gross mass lesion to suggest recurrent disease. 3. Moderate aortic and coronary calcific atherosclerosis. 4. Small hiatal hernia. 5. Stable postsurgical changes in the left infrahilar region. Electronically Signed   By: Kristine Garbe M.D.   On: 05/24/2018 20:37   Ct Abdomen Pelvis W Contrast  Result Date: 05/25/2018 CLINICAL DATA:  Worsening abdominal pain, vomiting and distension for 2 days. History of lung cancer, COPD, hysterectomy, cholecystectomy, diverticulosis. EXAM: CT ABDOMEN AND PELVIS WITH CONTRAST  TECHNIQUE: Multidetector CT imaging of the abdomen and pelvis was performed using the standard protocol following bolus administration of intravenous contrast. CONTRAST:  145mL ISOVUE-300 IOPAMIDOL (ISOVUE-300) INJECTION 61% COMPARISON:  CT abdomen and pelvis May 22, 2018 and CT chest May 24, 2018. FINDINGS: LOWER CHEST: LEFT lung base fibrosis tenting the esophagus. Distended esophagus with small air contrast level. Worsening LEFT lung base patchy airspace opacity. HEPATOBILIARY: Mild biliary dilatation LEFT lobe of the liver, likely postprocedural. Status post cholecystectomy. No intrahepatic masses. PANCREAS: Nonacute.  Punctate calcification. SPLEEN: Normal. ADRENALS/URINARY TRACT: Kidneys are orthotopic, demonstrating symmetric enhancement. No obstructing nephrolithiasis, hydronephrosis or solid renal masses. Early excretion of contrast limits sensitivity for potential small nonobstructing nephrolithiasis. The unopacified ureters are normal in course and caliber. Delayed imaging through the kidneys demonstrates symmetric prompt contrast excretion within the proximal urinary collecting system. Urinary bladder is well distended containing contrast. Normal adrenal glands. STOMACH/BOWEL: Small bowel dilated to 3.9 cm with air-fluid levels and mural edema most apparent within the pelvis. Focal inflammatory changes of small bowel in LEFT central pelvis (image 55). Multiple small-bowel transition points in the pelvis. The stomach, large bowel are normal in course and caliber without inflammatory changes. Severe sigmoid and to lesser extent descending colonic diverticulosis. VASCULAR/LYMPHATIC: Mildly patent infrarenal aorta. Moderate calcific atherosclerosis. No lymphadenopathy by CT size criteria. REPRODUCTIVE: Status post hysterectomy. OTHER: Small volume free fluid in the pelvis. No intraperitoneal free air or focal fluid collections. Mesenteric fat stranding mid pelvis. MUSCULOSKELETAL: Nonacute. Moderate to  severe LEFT hip osteoarthrosis. Degenerative changes lumbar spine superimposed on congenital canal narrowing. IMPRESSION: 1. Low-grade small bowel obstruction with multiple transition points in the pelvis associated with inflammatory changes, this may be secondary to enteritis or adhesions. 2. LEFT lung base fibrosis with superimposed worsening airspace opacities, this could reflect post radiation pneumonitis or pneumonia. 3. Colonic diverticulosis without acute diverticulitis. Aortic Atherosclerosis (ICD10-I70.0). Electronically Signed   By: Elon Alas M.D.   On: 05/25/2018 05:40   Dg Chest Port 1 View  Result Date: 06/02/2018 CLINICAL DATA:  Leukocytosis, resected left lung cancer EXAM: PORTABLE CHEST 1 VIEW COMPARISON:  05/26/2018 chest radiograph. FINDINGS: Right subclavian MediPort terminates in the lower third of the SVC. Stable cardiomediastinal silhouette with mild cardiomegaly. No pneumothorax. No pleural effusion. No overt pulmonary edema. Patchy left lung base opacity, not appreciably changed. Emphysema. IMPRESSION: 1. No appreciable change in nonspecific patchy left lung base opacity, which could represent scarring, atelectasis, pneumonia or post treatment change. 2.  Emphysema (ICD10-J43.9). Electronically Signed   By: Ilona Sorrel M.D.   On: 06/02/2018 16:24   Dg Chest Port 1 View  Result Date: 05/26/2018 CLINICAL DATA:  NG tube placement. EXAM: PORTABLE CHEST 1 VIEW COMPARISON:  Chest x-ray from yesterday. FINDINGS: The patient is rotated to the left. Interval placement of an NG tube seen entering the stomach, with the tip below the field of view. Unchanged right chest wall port catheter. Stable cardiomegaly and pulmonary vascular congestion. Low lung volumes with increased right basilar atelectasis and unchanged left lower lobe opacity. No pleural effusion or pneumothorax. No acute osseous abnormality. IMPRESSION: 1. NG tube entering the stomach with tip below the field of view. 2.  Unchanged left lower lobe airspace disease versus post radiation treatment changes. 3. Stable mild pulmonary vascular congestion. Electronically Signed   By: Titus Dubin M.D.   On: 05/26/2018 12:42   Dg Chest Port 1 View  Result Date: 05/25/2018 CLINICAL DATA:  Shortness of breath. History of lung malignancy resected 10 years ago, COPD, CHF. EXAM: PORTABLE CHEST 1 VIEW COMPARISON:  Chest x-ray and chest CT scan of May 24, 2018 FINDINGS: The right lung is well-expanded and clear. On the left there is increased density at the lung base more conspicuous  today. The left hemidiaphragm is further obscured. The cardiac silhouette is enlarged and the pulmonary vascularity mildly engorged. The power port catheter tip projects over the midportion of the SVC. There is calcification in the wall of the aortic arch. IMPRESSION: Increased density in the left lower lobe similar to that seen on the CT scan yesterday consistent with progressive post radiation change or superimposed pneumonia. Given the worsening over the past day I favor the latter. There is low-grade CHF which is fairly stable. Thoracic aortic atherosclerosis. Electronically Signed   By: David  Martinique M.D.   On: 05/25/2018 11:46   Dg Abd Portable 1v  Result Date: 05/26/2018 CLINICAL DATA:  Small-bowel obstruction EXAM: PORTABLE ABDOMEN - 1 VIEW COMPARISON:  Abdominal and pelvic CT scan dated May 25, 2018 FINDINGS: There remain loops of mildly distended gas and fluid-filled small bowel in the mid abdomen. No free extraluminal gas collections are observed. No significant colonic or rectal gas or stool is observed. IMPRESSION: Persistent mid to distal small bowel obstruction. Electronically Signed   By: David  Martinique M.D.   On: 05/26/2018 07:45     Subjective: Pt says that she feels much better. She has has no further black stools.   Discharge Exam: Vitals:   06/04/18 0739 06/04/18 1413  BP:  (!) 130/51  Pulse:  90  Resp:    Temp:  98.7 F  (37.1 C)  SpO2: 96% 96%   Vitals:   06/03/18 2112 06/04/18 0614 06/04/18 0739 06/04/18 1413  BP: (!) 127/52 (!) 136/51  (!) 130/51  Pulse: 78 76  90  Resp:      Temp: 98.1 F (36.7 C) 98.5 F (36.9 C)  98.7 F (37.1 C)  TempSrc: Oral Oral  Oral  SpO2: 95% 98% 96% 96%  Weight:      Height:       General exam: Alert x 3, calm, cooperative Respiratory system: Clear. No increased work of breathing. Cardiovascular system: S1 & S2 heard, RRR. No JVD, murmurs, gallops, clicks or pedal edema. Gastrointestinal system: Abdomen is distended mild tenderness. Soft, with normal bowel sounds heard. Central nervous system: Alert and oriented. No focal neurological deficits. Extremities: no cyanosis.   The results of significant diagnostics from this hospitalization (including imaging, microbiology, ancillary and laboratory) are listed below for reference.     Microbiology: No results found for this or any previous visit (from the past 240 hour(s)).   Labs: BNP (last 3 results) Recent Labs    06/22/17 2331 02/04/18 1610 02/05/18 2211  BNP 43.0 70.0 867.6*   Basic Metabolic Panel: Recent Labs  Lab 05/29/18 0641 05/29/18 2029 05/31/18 0550 06/01/18 0505 06/02/18 0630 06/03/18 0426 06/04/18 0513  NA 144 143 145 148* 141 140 139  K 3.8 4.0 3.6 3.9 3.1* 3.5 3.7  CL 103 105 103 106 104 104 107  CO2 29 27 31  33* 27 29 27   GLUCOSE 127* 126* 85 103* 116* 121* 125*  BUN 19 21* 27* 23 16 13 10   CREATININE 1.05* 1.00 1.18* 1.12* 1.07* 0.94 0.99  CALCIUM 9.2 9.1 8.8* 8.9 8.3* 8.1* 8.0*  MG 2.3 2.3  --   --  1.7 2.1  --   PHOS  --  4.3  --   --   --   --   --    Liver Function Tests: Recent Labs  Lab 05/29/18 0641 05/29/18 2029  AST 11* 13*  ALT 8* 9*  ALKPHOS 68 72  BILITOT 0.5 0.7  PROT  6.3* 6.5  ALBUMIN 2.4* 2.6*   No results for input(s): LIPASE, AMYLASE in the last 168 hours. No results for input(s): AMMONIA in the last 168 hours. CBC: Recent Labs  Lab  05/29/18 0641 05/30/18 0610 05/31/18 0550 06/01/18 0505 06/02/18 0630 06/03/18 0426 06/04/18 0513  WBC 20.3* 15.7* 14.3* 12.8* 17.3* 11.2* 10.7*  NEUTROABS 16.8* 12.8* 11.1*  --   --  8.6*  --   HGB 10.9* 10.8* 10.6* 10.9* 10.5* 8.8* 8.8*  HCT 35.9* 36.2 35.3* 35.8* 35.1* 29.8* 29.6*  MCV 86.9 87.4 87.8 87.5 86.9 87.1 86.8  PLT 207 246 291 316 305 284 273   Cardiac Enzymes: No results for input(s): CKTOTAL, CKMB, CKMBINDEX, TROPONINI in the last 168 hours. BNP: Invalid input(s): POCBNP CBG: Recent Labs  Lab 06/03/18 1618 06/03/18 2021 06/04/18 0004 06/04/18 0902 06/04/18 1155  GLUCAP 98 157* 132* 192* 166*   D-Dimer No results for input(s): DDIMER in the last 72 hours. Hgb A1c No results for input(s): HGBA1C in the last 72 hours. Lipid Profile No results for input(s): CHOL, HDL, LDLCALC, TRIG, CHOLHDL, LDLDIRECT in the last 72 hours. Thyroid function studies No results for input(s): TSH, T4TOTAL, T3FREE, THYROIDAB in the last 72 hours.  Invalid input(s): FREET3 Anemia work up No results for input(s): VITAMINB12, FOLATE, FERRITIN, TIBC, IRON, RETICCTPCT in the last 72 hours. Urinalysis    Component Value Date/Time   COLORURINE YELLOW 05/24/2018 2222   APPEARANCEUR CLOUDY (A) 05/24/2018 2222   LABSPEC 1.038 (H) 05/24/2018 2222   PHURINE 6.0 05/24/2018 2222   GLUCOSEU NEGATIVE 05/24/2018 2222   HGBUR SMALL (A) 05/24/2018 2222   BILIRUBINUR NEGATIVE 05/24/2018 2222   KETONESUR NEGATIVE 05/24/2018 2222   PROTEINUR NEGATIVE 05/24/2018 2222   UROBILINOGEN 0.2 04/19/2012 1411   NITRITE NEGATIVE 05/24/2018 2222   LEUKOCYTESUR LARGE (A) 05/24/2018 2222   Sepsis Labs Invalid input(s): PROCALCITONIN,  WBC,  LACTICIDVEN Microbiology No results found for this or any previous visit (from the past 240 hour(s)).  Time coordinating discharge: 33 mins  SIGNED:  Irwin Brakeman, MD  [facility] 06/04/2018, 3:09 PM Pager [phone]  If 7PM-7AM, please  contact night-coverage [email or web address] Password [email or web address]

## 2018-06-04 NOTE — Addendum Note (Signed)
Addendum  created 06/04/18 0841 by Ollen Bowl, CRNA   Charge Capture section accepted

## 2018-06-04 NOTE — Anesthesia Postprocedure Evaluation (Signed)
Anesthesia Post Note  Patient: Angela Lawson  Procedure(s) Performed: ESOPHAGOGASTRODUODENOSCOPY (EGD) WITH PROPOFOL (N/A ) [place]  Patient location during evaluation: Nursing Unit Anesthesia Type: MAC Level of consciousness: awake and awake and alert Pain management: pain level controlled Vital Signs Assessment: post-procedure vital signs reviewed and stable Respiratory status: spontaneous breathing Cardiovascular status: stable Postop Assessment: no apparent nausea or vomiting Anesthetic complications: no     Last Vitals:  Vitals:   06/04/18 0614 06/04/18 0739  BP: (!) 136/51   Pulse: 76   Resp:    Temp: 36.9 C   SpO2: 98% 96%    Last Pain:  Vitals:   06/04/18 1038  TempSrc:   PainSc: 0-No pain                 Meigan Pates

## 2018-06-04 NOTE — Clinical Social Work Placement (Signed)
   CLINICAL SOCIAL WORK PLACEMENT  NOTE  Date:  06/04/2018  Patient Details  Name: Angela Lawson MRN: [id number] Date of Birth: 1934/11/02  Clinical Social Work is seeking post-discharge placement for this patient at the [place] level of care (*CSW will initial, date and re-position this form in  chart as items are completed):  Yes   Patient/family provided with [place] Work Department's list of facilities offering this level of care within the geographic area requested by the patient (or if unable, by the patient's family).  Yes   Patient/family informed of their freedom to choose among providers that offer the needed level of care, that participate in Medicare, Medicaid or managed care program needed by the patient, have an available bed and are willing to accept the patient.  Yes   Patient/family informed of [facility]'s ownership interest in [facility] and [facility], as well as of the fact that they are under no obligation to receive care at these facilities.  PASRR submitted to EDS on 06/03/18     PASRR number received on 06/03/18     Existing PASRR number confirmed on       FL2 transmitted to all facilities in geographic area requested by pt/family on 06/03/18     FL2 transmitted to all facilities within larger geographic area on 06/04/18     Patient informed that his/her managed care company has contracts with or will negotiate with certain facilities, including the following:        Yes   Patient/family informed of bed offers received.  Patient chooses bed at [facility]     Physician recommends and patient chooses bed at      Patient to be transferred to [facility] on 06/04/18.  Patient to be transferred to facility by [facility]     Patient family notified on 06/04/18 of transfer.  Name of family member notified:  Rudene Anda, DTR     PHYSICIAN       Additional Comment:  Facility notified. Discharge  clinicals sent via epic hub. LCSW signing off.   _______________________________________________ Ihor Gully, LCSW 06/04/2018, 3:22 PM

## 2018-06-07 ENCOUNTER — Encounter (HOSPITAL_COMMUNITY): Payer: Self-pay | Admitting: Internal Medicine

## 2018-06-07 DIAGNOSIS — C3490 Malignant neoplasm of unspecified part of unspecified bronchus or lung: Secondary | ICD-10-CM | POA: Diagnosis not present

## 2018-06-07 DIAGNOSIS — K56609 Unspecified intestinal obstruction, unspecified as to partial versus complete obstruction: Secondary | ICD-10-CM | POA: Diagnosis not present

## 2018-06-07 DIAGNOSIS — J189 Pneumonia, unspecified organism: Secondary | ICD-10-CM | POA: Diagnosis not present

## 2018-06-07 DIAGNOSIS — I5032 Chronic diastolic (congestive) heart failure: Secondary | ICD-10-CM | POA: Diagnosis not present

## 2018-06-08 ENCOUNTER — Other Ambulatory Visit (HOSPITAL_COMMUNITY): Payer: Self-pay | Admitting: Hematology

## 2018-06-08 DIAGNOSIS — J189 Pneumonia, unspecified organism: Secondary | ICD-10-CM | POA: Diagnosis not present

## 2018-06-08 DIAGNOSIS — K56609 Unspecified intestinal obstruction, unspecified as to partial versus complete obstruction: Secondary | ICD-10-CM | POA: Diagnosis not present

## 2018-06-08 DIAGNOSIS — C3490 Malignant neoplasm of unspecified part of unspecified bronchus or lung: Secondary | ICD-10-CM | POA: Diagnosis not present

## 2018-06-08 DIAGNOSIS — G20 Parkinson's disease: Secondary | ICD-10-CM | POA: Diagnosis not present

## 2018-06-09 ENCOUNTER — Encounter: Payer: Self-pay | Admitting: Internal Medicine

## 2018-06-11 ENCOUNTER — Inpatient Hospital Stay (HOSPITAL_BASED_OUTPATIENT_CLINIC_OR_DEPARTMENT_OTHER): Payer: Medicare Other | Admitting: Hematology

## 2018-06-11 ENCOUNTER — Inpatient Hospital Stay (HOSPITAL_COMMUNITY): Payer: Medicare Other | Attending: Hematology

## 2018-06-11 ENCOUNTER — Encounter (HOSPITAL_COMMUNITY): Payer: Self-pay | Admitting: Hematology

## 2018-06-11 ENCOUNTER — Inpatient Hospital Stay (HOSPITAL_COMMUNITY): Payer: Medicare Other

## 2018-06-11 VITALS — BP 148/75 | HR 80 | Temp 97.9°F | Resp 20

## 2018-06-11 VITALS — BP 132/59 | HR 88 | Temp 97.6°F | Resp 20 | Wt 167.4 lb

## 2018-06-11 DIAGNOSIS — R197 Diarrhea, unspecified: Secondary | ICD-10-CM | POA: Diagnosis not present

## 2018-06-11 DIAGNOSIS — E785 Hyperlipidemia, unspecified: Secondary | ICD-10-CM

## 2018-06-11 DIAGNOSIS — Z9981 Dependence on supplemental oxygen: Secondary | ICD-10-CM

## 2018-06-11 DIAGNOSIS — I700 Atherosclerosis of aorta: Secondary | ICD-10-CM | POA: Diagnosis not present

## 2018-06-11 DIAGNOSIS — K5900 Constipation, unspecified: Secondary | ICD-10-CM

## 2018-06-11 DIAGNOSIS — I10 Essential (primary) hypertension: Secondary | ICD-10-CM | POA: Diagnosis not present

## 2018-06-11 DIAGNOSIS — R531 Weakness: Secondary | ICD-10-CM

## 2018-06-11 DIAGNOSIS — M1990 Unspecified osteoarthritis, unspecified site: Secondary | ICD-10-CM | POA: Insufficient documentation

## 2018-06-11 DIAGNOSIS — I509 Heart failure, unspecified: Secondary | ICD-10-CM | POA: Insufficient documentation

## 2018-06-11 DIAGNOSIS — Z794 Long term (current) use of insulin: Secondary | ICD-10-CM | POA: Insufficient documentation

## 2018-06-11 DIAGNOSIS — Z5112 Encounter for antineoplastic immunotherapy: Secondary | ICD-10-CM | POA: Insufficient documentation

## 2018-06-11 DIAGNOSIS — C3490 Malignant neoplasm of unspecified part of unspecified bronchus or lung: Secondary | ICD-10-CM

## 2018-06-11 DIAGNOSIS — G20 Parkinson's disease: Secondary | ICD-10-CM | POA: Insufficient documentation

## 2018-06-11 DIAGNOSIS — Z79899 Other long term (current) drug therapy: Secondary | ICD-10-CM

## 2018-06-11 DIAGNOSIS — G620 Drug-induced polyneuropathy: Secondary | ICD-10-CM

## 2018-06-11 DIAGNOSIS — R5383 Other fatigue: Secondary | ICD-10-CM | POA: Diagnosis not present

## 2018-06-11 DIAGNOSIS — Z7982 Long term (current) use of aspirin: Secondary | ICD-10-CM

## 2018-06-11 DIAGNOSIS — J449 Chronic obstructive pulmonary disease, unspecified: Secondary | ICD-10-CM | POA: Insufficient documentation

## 2018-06-11 DIAGNOSIS — E119 Type 2 diabetes mellitus without complications: Secondary | ICD-10-CM

## 2018-06-11 DIAGNOSIS — R630 Anorexia: Secondary | ICD-10-CM | POA: Insufficient documentation

## 2018-06-11 DIAGNOSIS — I252 Old myocardial infarction: Secondary | ICD-10-CM

## 2018-06-11 DIAGNOSIS — I110 Hypertensive heart disease with heart failure: Secondary | ICD-10-CM | POA: Insufficient documentation

## 2018-06-11 DIAGNOSIS — Z87891 Personal history of nicotine dependence: Secondary | ICD-10-CM

## 2018-06-11 DIAGNOSIS — Z7189 Other specified counseling: Secondary | ICD-10-CM

## 2018-06-11 LAB — CBC WITH DIFFERENTIAL/PLATELET
Basophils Absolute: 0 10*3/uL (ref 0.0–0.1)
Basophils Relative: 0 %
Eosinophils Absolute: 0.1 10*3/uL (ref 0.0–0.7)
Eosinophils Relative: 1 %
HCT: 31.7 % — ABNORMAL LOW (ref 36.0–46.0)
Hemoglobin: 9.3 g/dL — ABNORMAL LOW (ref 12.0–15.0)
Lymphocytes Relative: 11 %
Lymphs Abs: 1.2 10*3/uL (ref 0.7–4.0)
MCH: 25.1 pg — ABNORMAL LOW (ref 26.0–34.0)
MCHC: 29.3 g/dL — ABNORMAL LOW (ref 30.0–36.0)
MCV: 85.4 fL (ref 78.0–100.0)
Monocytes Absolute: 1 10*3/uL (ref 0.1–1.0)
Monocytes Relative: 10 %
Neutro Abs: 8.1 10*3/uL — ABNORMAL HIGH (ref 1.7–7.7)
Neutrophils Relative %: 78 %
Platelets: 441 10*3/uL — ABNORMAL HIGH (ref 150–400)
RBC: 3.71 MIL/uL — ABNORMAL LOW (ref 3.87–5.11)
RDW: 16.3 % — ABNORMAL HIGH (ref 11.5–15.5)
WBC: 10.4 10*3/uL (ref 4.0–10.5)

## 2018-06-11 LAB — TSH: TSH: 1.067 u[IU]/mL (ref 0.350–4.500)

## 2018-06-11 LAB — COMPREHENSIVE METABOLIC PANEL
ALT: <5 U/L (ref 0–44)
AST: 16 U/L (ref 15–41)
Albumin: 2.6 g/dL — ABNORMAL LOW (ref 3.5–5.0)
Alkaline Phosphatase: 53 U/L (ref 38–126)
Anion gap: 8 (ref 5–15)
BUN: 7 mg/dL — ABNORMAL LOW (ref 8–23)
CO2: 27 mmol/L (ref 22–32)
Calcium: 8.8 mg/dL — ABNORMAL LOW (ref 8.9–10.3)
Chloride: 104 mmol/L (ref 98–111)
Creatinine, Ser: 1.04 mg/dL — ABNORMAL HIGH (ref 0.44–1.00)
GFR calc Af Amer: 56 mL/min — ABNORMAL LOW (ref >60)
GFR calc non Af Amer: 49 mL/min — ABNORMAL LOW (ref >60)
Glucose, Bld: 155 mg/dL — ABNORMAL HIGH (ref 70–99)
Potassium: 4.2 mmol/L (ref 3.5–5.1)
Sodium: 139 mmol/L (ref 135–145)
Total Bilirubin: 0.3 mg/dL (ref 0.3–1.2)
Total Protein: 6.1 g/dL — ABNORMAL LOW (ref 6.5–8.1)

## 2018-06-11 MED ORDER — SODIUM CHLORIDE 0.9% FLUSH
10.0000 mL | INTRAVENOUS | Status: DC | PRN
  Administered 2018-06-11: 10 mL
  Filled 2018-06-11: qty 10

## 2018-06-11 MED ORDER — SODIUM CHLORIDE 0.9 % IV SOLN
Freq: Once | INTRAVENOUS | Status: AC
  Administered 2018-06-11: 10:00:00 via INTRAVENOUS

## 2018-06-11 MED ORDER — SODIUM CHLORIDE 0.9 % IV SOLN
1200.0000 mg | Freq: Once | INTRAVENOUS | Status: AC
  Administered 2018-06-11: 1200 mg via INTRAVENOUS
  Filled 2018-06-11: qty 20

## 2018-06-11 MED ORDER — HEPARIN SOD (PORK) LOCK FLUSH 100 UNIT/ML IV SOLN
500.0000 [IU] | Freq: Once | INTRAVENOUS | Status: AC | PRN
  Administered 2018-06-11: 500 [IU]

## 2018-06-11 NOTE — Patient Instructions (Signed)
[facility] Discharge Instructions for Patients Receiving Chemotherapy  Today you received the following chemotherapy agents tecentriq.    If you develop nausea and vomiting that is not controlled by your nausea medication, call the clinic.   BELOW ARE SYMPTOMS THAT SHOULD BE REPORTED IMMEDIATELY:  *FEVER GREATER THAN 100.5 F  *CHILLS WITH OR WITHOUT FEVER  NAUSEA AND VOMITING THAT IS NOT CONTROLLED WITH YOUR NAUSEA MEDICATION  *UNUSUAL SHORTNESS OF BREATH  *UNUSUAL BRUISING OR BLEEDING  TENDERNESS IN MOUTH AND THROAT WITH OR WITHOUT PRESENCE OF ULCERS  *URINARY PROBLEMS  *BOWEL PROBLEMS  UNUSUAL RASH Items with * indicate a potential emergency and should be followed up as soon as possible.  Feel free to call the clinic should you have any questions or concerns. The clinic phone number is [phone].  Please show the [facility] at check-in to the Emergency Department and triage nurse.

## 2018-06-11 NOTE — Progress Notes (Signed)
Patient tolerated chemotherapy with no complaints voiced.  Port site clean and dry with no bruising or swelling noted.  Port flushed easily before and after administration of therapy.  No complaints of pain with flush.  Band aid applied.  VSS with discharge and left with no s/s of distress noted.  Family at side.

## 2018-06-11 NOTE — Patient Instructions (Signed)
[facility] Discharge Instructions  You saw Dr. Katragadda today.   Thank you for choosing [facility] at [facility] to provide your oncology and hematology care.  To afford each patient quality time with our provider, please arrive at least 15 minutes before your scheduled appointment time.   If you have a lab appointment with the Cancer Center please come in thru the  Main Entrance and check in at the main information desk  You need to re-schedule your appointment should you arrive 10 or more minutes late.  We strive to give you quality time with our providers, and arriving late affects you and other patients whose appointments are after yours.  Also, if you no show three or more times for appointments you may be dismissed from the clinic at the providers discretion.     Again, thank you for choosing [facility].  Our hope is that these requests will decrease the amount of time that you wait before being seen by our physicians.       _____________________________________________________________  Should you have questions after your visit to [facility], please contact our office at [phone] between the hours of 8:30 a.m. and 4:30 p.m.  Voicemails left after 4:30 p.m. will not be returned until the following business day.  For prescription refill requests, have your pharmacy contact our office.       Resources For Cancer Patients and their Caregivers ? American Cancer Society: Can assist with transportation, wigs, general needs, runs Look Good Feel Better.        1-888-227-6333 ? Cancer Care: Provides financial assistance, online support groups, medication/co-pay assistance.  1-800-813-HOPE (4673) ? [facility] Assists [place] cancer patients and their families through emotional , educational and financial support.  336-427-4357 ? [place] DSS Where to apply for  food stamps, Medicaid and utility assistance. 336-342-1394 ? [facility]: Transportation to medical appointments. 336-347-2287 ? Social Security Administration: May apply for disability if have a Stage IV cancer. 336-342-7796 1-800-772-1213 ? [place] Aging, Disability and Transit Services: Assists with nutrition, care and transit needs. 336-349-2343  Cancer Center Support Programs:   > Cancer Support Group  2nd Tuesday of the month 1pm-2pm, Journey Room   > Creative Journey  3rd Tuesday of the month 1130am-1pm, Journey Room     

## 2018-06-11 NOTE — Progress Notes (Signed)
[address], [place] [address]   CLINIC:  Medical Oncology/Hematology  PCP:  Rosita Fire, MD [place] [address] [phone]   REASON FOR VISIT:  Follow-up for stage IV lung cancer  CURRENT THERAPY: Tecentriq every 3 weeks        BRIEF ONCOLOGIC HISTORY:    Adenocarcinoma of lung ([place])   07/07/2008 Surgery    Performed in [place], [place].  Consistent with poorly differentiated adenocarcinoma.      02/27/2014 Imaging    CT of chest demonstrating a RLL solid nodular component measuring 9 mm at the site of previous ground-glass opacity. Slight increase in size of an anterior mediastinal node measuring 1.1 cm with stable 1 cm pretracheal lymph node. Consider PET      03/20/2014 Imaging    PET scan- No hypermetabolic pulmonary lesions to suggest recurrent or metastatic pulmonary disease.  Weakly positive left internal mammary lymph node and left axillary lymph node (? inflammatory process involving the left breast).      11/07/2015 Imaging    CT chest- 1. No evidence of metastatic involvement of the lungs. The previously noted small noncalcified lung nodules are stable. 2. Vague focal opacity in the right lower lobe may represent scarring and is stable. Continued followup however is recommended in 1 year.      01/08/2017 Imaging    CT chest- 1. New nodules along a thick walled bullous lesion in the right lower lobe, suspicious for malignancy. 2. New fairly large region of confluent ground-glass opacity posteriorly in the remaining left lung, low grade adenocarcinoma is not excluded and there is mild new nodularity along the upper margin of this process. 3. Stable biapical pleuroparenchymal scarring with several stable nodules in the left lung. 4. Borderline adenopathy in the mediastinum, essentially stable from prior. 5. Coronary, aortic arch, and branch vessel atherosclerotic vascular disease. 6. Small hiatal  hernia. 7. Hypodense left thyroid lesion. If not previously worked up, thyroid ultrasound could be employed.      01/21/2017 PET scan    1. New hypermetabolic adenopathy in the mediastinum and hila, with progressive hypermetabolic ground-glass density posteriorly in the left lower lobe with faint nodular components, and some low-grade hypermetabolic activity in ground-glass opacity dependently in the right upper lobe. Although the appearance is concerning for malignancy potentially with a lymphangitic component in the left lower lobe, the unusual configuration in the appearance of the ground-glass opacities could conceivably represent an atypical granulomatous infectious process as an alternative. This is not classic obvious recurrence with well-defined mass or nodules. 2. There is some ill-defined clustered nodularity in the right lower lobe only faintly metabolic, maximum SUV 3.0. 3. Overall I believe that this process require surveillance to differentiate an inflammatory/infectious condition from recurrent malignancy. The adenopathy is certainly concerning. 4. No findings of malignancy in the neck, abdomen/pelvis, or skeleton. 5. Sigmoid colon diverticulosis. 6. Emphysema. 7. Chronic ethmoid sinusitis.      03/25/2017 Imaging    CT chest- Stable 5.9 cm left lower lobe ground-glass opacity, hypermetabolic on prior PET, worrisome for primary bronchogenic neoplasm such as low-grade adenocarcinoma.  Stable 2.2 cm cavitary lesion in the central right lower lobe with surrounding hypermetabolic nodularity measuring up to 1.8 cm, worrisome for primary bronchogenic neoplasm such as squamous cell carcinoma.  Hypermetabolic mediastinal lymph nodes measuring up to 11 mm short axis, as above. Bilateral hilar regions are hypermetabolic on PET but poorly evaluated on unenhanced CT.  Emphysema and  aortic atherosclerosis.      04/23/2017 Procedure    Bronchoscopy and EBUS with  mediastinal lymph node aspirations by Dr. Roxan Hockey.      04/26/2017 Pathology Results    1. Lung, biopsy, Left upper lobe - ADENOCARCINOMA. - SEE COMMENT. 2. Lung, biopsy, Right lower lobe - ADENOCARCINOMA. - SEE COMMENT. 3. Lung, biopsy, Right lower lobe target 2 - ATYPICAL CELLS. - SEE COMMENT.      04/26/2017 Relapse/Recurrence         05/14/2017 Pathology Results    PDL1 NEGATIVE.  TPS 0%.      05/19/2017 Pathology Results    FoundationOne testing- MS-stable.  TMB 6 Muts/Mb (Intermediate).  STK11 E293*.  Negative for EGFR, KRAS, ALK, BRAF, MET, RET, ERBB2, and ROS1.  Potential treatment options: Atezolizumab/Durvalumab/Nivolumab/Pembrolizumab for TMB score.      07/27/2017 Imaging    CT C/A/P: IMPRESSION: Resolution of right lower lobe lesion and nodules.  Unchanged ill-defined nonspecific left lower lobe ground-glass opacity and stable mildly prominent mediastinal lymph nodes.  No evidence of metastatic disease or acute abnormality within the abdomen or pelvis.  Aortic Atherosclerosis (ICD10-I70.0) and Emphysema (ICD10-J43.9).      10/08/2017 Imaging    CT C/A/P: Similar appearance of the chest with an upper normal sized paratracheal lymph node, confluent ground-glass opacity in the left lower lobe, and severe centrilobular emphysema, but no findings of progressive or definite active malignancy.        CANCER STAGING: Cancer Staging Adenocarcinoma of lung [facility]) Staging form: Lung, AJCC 7th Edition - Clinical: Stage IA - Signed by Baird Cancer, PA-C on 02/27/2014 - Pathologic stage from 04/26/2017: Stage IV (T2b(2), N3, M1a) - Signed by Baird Cancer, PA-C on 04/26/2017    INTERVAL HISTORY:  [name] 84 y.o. female returns for routine follow-up lung cancer and immunotherapy treatment today. Patient was in the hospital for 10 days for aspiration pneumonia and small bowel obstruction and was discharged to the [facility] for rehab. Patient is  still feeling very weak and feels she is unable to perform all of her own ADLs. She has zero energy. Patient is on continuous home 02. Patient complains of not having an appetite and wants medication to help increase this. She has alternating constipation with diarrhea. Denies any hemoptysis or coughing.  She also complains of dryness in her mouth.  She is using Biotene twice daily.    REVIEW OF SYSTEMS:  Review of Systems  Constitutional: Positive for fatigue.  Respiratory: Positive for shortness of breath (on continuous home 02).   Gastrointestinal: Positive for constipation and diarrhea.  Neurological: Positive for extremity weakness.  All other systems reviewed and are negative.    PAST MEDICAL/SURGICAL HISTORY:  Past Medical History:  Diagnosis Date  . Adenocarcinoma of lung ([place])    Left lung 2009, resected  . Anginal pain ([place])   . Arthritis   . Back pain   . CHF (congestive heart failure) ([place])   . COPD (chronic obstructive pulmonary disease) ([place])   . Diverticulitis   . Dyslipidemia   . Essential hypertension   . H/O ventral hernia   . Non-obstructive CAD    a. 04/2015 NSTEMI/Cath: LAD 10p, LCX 17m RCA 353m20d, EF 35-40 w/ apical ballooning.  . On home O2    3L N/C  . Osteoporosis   . Osteoporosis 11/04/2015   Managed by Dr. FaLegrand Rams . Parkinson's disease ([facility]  . Shortness of breath   . Takotsubo cardiomyopathy  a. 04/2015 Echo: EF 45-50%, mid-dist anterior/apical/inferoapical HK w/ hyperdynamic base. Gr 1 DD, mild AI, mild-mod MR, triv TR, PASP 81mHg;  b. 04/2015 LV gram: Ef 35-40% w/ apical ballooning.  . Type II diabetes mellitus ([facility]   . Ventricular bigeminy    a. 04/2015 in setting of NSTEMI/Takotsubo.   Past Surgical History:  Procedure Laterality Date  . ABDOMINAL HYSTERECTOMY    . BIOPSY  06/03/2018   Procedure: BIOPSY;  Surgeon: RDaneil Dolin MD;  [facility];  Service: Endoscopy;;  gastric   . CARDIAC CATHETERIZATION N/A 04/17/2015     Procedure: Left Heart Cath and Coronary Angiography;  Surgeon: TTroy Sine MD;  [facility];  Service: Cardiovascular;  Laterality: N/A;  . CHOLECYSTECTOMY    . COLONOSCOPY N/A 09/18/2014   Dr. FOneida Alar diverticulosis throughout the colon, two tubular adenomas removed, internal hemorrhoids. next TCS in 10-15 years if benefits outweigh risks  . ECTOPIC PREGNANCY SURGERY    . ESOPHAGOGASTRODUODENOSCOPY (EGD) WITH PROPOFOL N/A 06/03/2018   Procedure: ESOPHAGOGASTRODUODENOSCOPY (EGD) WITH PROPOFOL;  Surgeon: RDaneil Dolin MD;  [facility];  Service: Endoscopy;  Laterality: N/A;  . INCISIONAL HERNIA REPAIR N/A 08/26/2013   Procedure: HERNIA REPAIR INCISIONAL WITH MESH;  Surgeon: MJamesetta So MD;  Location: [facility];  Service: General;  Laterality: N/A;  . IR FLUORO GUIDE PORT INSERTION RIGHT  04/30/2017  . IR [place] VASC ACCESS RIGHT  04/30/2017  . LUNG CANCER SURGERY    . MALONEY DILATION  06/03/2018   Procedure: MALONEY DILATION;  Surgeon: RDaneil Dolin MD;  [facility];  Service: Endoscopy;;  . VIDEO BRONCHOSCOPY WITH ENDOBRONCHIAL NAVIGATION N/A 04/23/2017   Procedure: VIDEO BRONCHOSCOPY WITH ENDOBRONCHIAL NAVIGATION;  Surgeon: HMelrose Nakayama MD;  [facility]  Service: Thoracic;  Laterality: N/A;  . VIDEO BRONCHOSCOPY WITH ENDOBRONCHIAL ULTRASOUND N/A 04/23/2017   Procedure: VIDEO BRONCHOSCOPY WITH ENDOBRONCHIAL ULTRASOUND;  Surgeon: HMelrose Nakayama MD;  [facility]  Service: Thoracic;  Laterality: N/A;     SOCIAL HISTORY:  Social History   Socioeconomic History  . Marital status: Widowed    Spouse name: Not on file  . Number of children: Not on file  . Years of education: Not on file  . Highest education level: Not on file  Occupational History  . Not on file  Social Needs  . Financial resource strain: Not on file  . Food insecurity:    Worry: Not on file    Inability: Not on file  . Transportation needs:     Medical: Not on file    Non-medical: Not on file  Tobacco Use  . Smoking status: Former Smoker    Years: 20.00    Types: Cigarettes    Last attempt to quit: 08/13/2006    Years since quitting: 11.8  . Smokeless tobacco: Never Used  Substance and Sexual Activity  . Alcohol use: No    Alcohol/week: 0.0 oz  . Drug use: No  . Sexual activity: Not Currently    Birth control/protection: Surgical  Lifestyle  . Physical activity:    Days per week: Not on file    Minutes per session: Not on file  . Stress: Not on file  Relationships  . Social connections:    Talks on phone: Not on file    Gets together: Not on file    Attends religious service: Not on file    Active member of club or organization:  Not on file    Attends meetings of clubs or organizations: Not on file    Relationship status: Not on file  . Intimate partner violence:    Fear of current or ex partner: Not on file    Emotionally abused: Not on file    Physically abused: Not on file    Forced sexual activity: Not on file  Other Topics Concern  . Not on file  Social History Narrative  . Not on file    FAMILY HISTORY:  Family History  Problem Relation Age of Onset  . Diabetes Mother   . Hypertension Mother   . Diabetes Father   . Asthma Unknown   . Cancer Unknown   . Heart attack Sister        X2  . Colon cancer Neg Hx     CURRENT MEDICATIONS:  Outpatient Encounter Medications as of 06/11/2018  Medication Sig  . albuterol (PROAIR HFA) 108 (90 BASE) MCG/ACT inhaler Inhale 2 puffs into the lungs every 4 (four) hours as needed for wheezing or shortness of breath.  Marland Kitchen alendronate (FOSAMAX) 70 MG tablet Take 70 mg by mouth every Friday.   Marland Kitchen aspirin EC 81 MG tablet Take 81 mg by mouth daily.  Huey Bienenstock (TECENTRIQ IV) Inject into the vein. Every 3 weeks  . atorvastatin (LIPITOR) 40 MG tablet Take 40 mg by mouth at bedtime.   . B-D ULTRAFINE III SHORT PEN 31G X 8 MM MISC See admin instructions.  . benzonatate  (TESSALON) 200 MG capsule TAKE 1 CAPSULE BY MOUTH THREE TIMES A DAY AS NEEDED  . buPROPion (WELLBUTRIN SR) 150 MG 12 hr tablet Take 150 mg by mouth 2 (two) times daily.   . carbidopa-levodopa (SINEMET IR) 25-100 MG tablet Take 1 tablet by mouth 2 (two) times daily.  . cholecalciferol (VITAMIN D) 1000 units tablet Take 1,000 Units by mouth daily.  . furosemide (LASIX) 20 MG tablet Take 1 tablet (20 mg total) by mouth daily.  Marland Kitchen gabapentin (NEURONTIN) 300 MG capsule Take 600 mg by mouth at bedtime.   . Garlic 621 MG TABS Take 300 mg by mouth daily.  Marland Kitchen ipratropium-albuterol (DUONEB) 0.5-2.5 (3) MG/3ML SOLN Inhale 3 mLs into the lungs every 6 (six) hours as needed (for wheezing/shortness of breath).   Marland Kitchen LANTUS SOLOSTAR 100 UNIT/ML Solostar Pen Inject 7 Units into the skin at bedtime.  . lidocaine-prilocaine (EMLA) cream Apply 1 application topically daily as needed.  Marland Kitchen losartan (COZAAR) 50 MG tablet Take 50 mg by mouth daily.  . metoprolol succinate (TOPROL-XL) 25 MG 24 hr tablet Take 1 tablet (25 mg total) by mouth daily.  . mometasone-formoterol (DULERA) 200-5 MCG/ACT AERO Inhale 2 puffs into the lungs 2 (two) times daily.  . nitroGLYCERIN (NITROSTAT) 0.4 MG SL tablet Place 0.4 mg under the tongue every 5 (five) minutes as needed for chest pain.  Marland Kitchen omeprazole (PRILOSEC) 20 MG capsule Take 20 mg by mouth 2 (two) times daily.  . ondansetron (ZOFRAN ODT) 4 MG disintegrating tablet Take 1 tablet (4 mg total) by mouth every 8 (eight) hours as needed for nausea or vomiting.  . OXYGEN Inhale 3 L into the lungs continuous.  . polyethylene glycol powder (GLYCOLAX/MIRALAX) powder Take 17 g by mouth daily as needed.  . potassium chloride (KLOR-CON 10) 10 MEQ tablet TAKE 1 TABLET (10 MEQ TOTAL) BY MOUTH DAILY.  Marland Kitchen rOPINIRole (REQUIP) 0.5 MG tablet Take 1 tablet (0.5 mg total) by mouth at bedtime.  . sodium chloride (  OCEAN) 0.65 % SOLN nasal spray Place 1 spray into both nostrils every 3 (three) hours as needed  for congestion.   No facility-administered encounter medications on file as of 06/11/2018.     ALLERGIES:  Allergies  Allergen Reactions  . Lisinopril Cough     PHYSICAL EXAM:  ECOG Performance status: 2  Vitals:   06/11/18 0923  BP: (!) 132/59  Pulse: 88  Resp: 20  Temp: 97.6 F (36.4 C)  SpO2: 95%   Filed Weights   06/11/18 0923  Weight: 167 lb 6.4 oz (75.9 kg)    Physical Exam   LABORATORY DATA:  I have reviewed the labs as listed.  CBC    Component Value Date/Time   WBC 10.4 06/11/2018 0853   RBC 3.71 (L) 06/11/2018 0853   HGB 9.3 (L) 06/11/2018 0853   HCT 31.7 (L) 06/11/2018 0853   PLT 441 (H) 06/11/2018 0853   MCV 85.4 06/11/2018 0853   MCH 25.1 (L) 06/11/2018 0853   MCHC 29.3 (L) 06/11/2018 0853   RDW 16.3 (H) 06/11/2018 0853   LYMPHSABS 1.2 06/11/2018 0853   MONOABS 1.0 06/11/2018 0853   EOSABS 0.1 06/11/2018 0853   BASOSABS 0.0 06/11/2018 0853   CMP Latest Ref Rng & Units 06/11/2018 06/04/2018 06/03/2018  Glucose 70 - 99 mg/dL 155(H) 125(H) 121(H)  BUN 8 - 23 mg/dL 7(L) 10 13  Creatinine 0.44 - 1.00 mg/dL 1.04(H) 0.99 0.94  Sodium 135 - 145 mmol/L 139 139 140  Potassium 3.5 - 5.1 mmol/L 4.2 3.7 3.5  Chloride 98 - 111 mmol/L 104 107 104  CO2 22 - 32 mmol/L '27 27 29  '$ Calcium 8.9 - 10.3 mg/dL 8.8(L) 8.0(L) 8.1(L)  Total Protein 6.5 - 8.1 g/dL 6.1(L) - -  Total Bilirubin 0.3 - 1.2 mg/dL 0.3 - -  Alkaline Phos 38 - 126 U/L 53 - -  AST 15 - 41 U/L 16 - -  ALT 0 - 44 U/L <5 - -        ASSESSMENT & PLAN:   Adenocarcinoma of lung ([facility]) 1.Stage IV adenocarcinoma of the lung: She had bilateral lung nodules and mediastinal adenopathy. -Tecentriq was started on 12/24/2017.  She did not experience any immunotherapy related side effects.  CT scan on 03/15/2018 of the chest, abdomen and pelvis did not show any lung nodules or other metastatic disease in the abdomen and pelvis.  Last Tecentriq was on 05/20/2018. - Hospitalized from 6/17 through 06/04/2018 due  to aspiration pneumonia and SBO.  She is currently residing in a nursing facility for rehab. -She will proceed with her next dose of Tecentriq today.  We will see her back in 3 weeks with repeat CT scan of the chest, abdomen and pelvis.  2.  COPD: Her last admission to the hospital with COPD exacerbation was from 02/05/2018 through 02/08/2018.  She continues to be on 3 L nasal cannula.  Her breathing status has improved since the discharge.  3.  Diabetes: She will continue Tradjenta and Lantus.  4.  Parkinson's disease: She is continuing Sinemet.  This is well controlled.  5.  Loss of appetite: -We will start her on Marinol 2.5 mg twice daily.  An order was given to the nursing facility.      Orders placed this encounter:  Orders Placed This Encounter  Procedures  . CT Abdomen Pelvis W Contrast  . CT Chest W Contrast  . CBC with Differential/Platelet  . Comprehensive metabolic panel      Dirk Dress  [place], [place] [phone]

## 2018-06-11 NOTE — Assessment & Plan Note (Signed)
1.Stage IV adenocarcinoma of the lung: She had bilateral lung nodules and mediastinal adenopathy. -Tecentriq was started on 12/24/2017.  She did not experience any immunotherapy related side effects.  CT scan on 03/15/2018 of the chest, abdomen and pelvis did not show any lung nodules or other metastatic disease in the abdomen and pelvis.  Last Tecentriq was on 05/20/2018. - Hospitalized from 6/17 through 06/04/2018 due to aspiration pneumonia and SBO.  She is currently residing in a nursing facility for rehab. -She will proceed with her next dose of Tecentriq today.  We will see her back in 3 weeks with repeat CT scan of the chest, abdomen and pelvis.  2.  COPD: Her last admission to the hospital with COPD exacerbation was from 02/05/2018 through 02/08/2018.  She continues to be on 3 L nasal cannula.  Her breathing status has improved since the discharge.  3.  Diabetes: She will continue Tradjenta and Lantus.  4.  Parkinson's disease: She is continuing Sinemet.  This is well controlled.  5.  Loss of appetite: -We will start her on Marinol 2.5 mg twice daily.  An order was given to the nursing facility.

## 2018-06-14 DIAGNOSIS — J189 Pneumonia, unspecified organism: Secondary | ICD-10-CM | POA: Diagnosis not present

## 2018-06-14 DIAGNOSIS — K56609 Unspecified intestinal obstruction, unspecified as to partial versus complete obstruction: Secondary | ICD-10-CM | POA: Diagnosis not present

## 2018-06-14 DIAGNOSIS — C3490 Malignant neoplasm of unspecified part of unspecified bronchus or lung: Secondary | ICD-10-CM | POA: Diagnosis not present

## 2018-06-17 DIAGNOSIS — J189 Pneumonia, unspecified organism: Secondary | ICD-10-CM | POA: Diagnosis not present

## 2018-06-17 DIAGNOSIS — C3490 Malignant neoplasm of unspecified part of unspecified bronchus or lung: Secondary | ICD-10-CM | POA: Diagnosis not present

## 2018-06-17 DIAGNOSIS — G20 Parkinson's disease: Secondary | ICD-10-CM | POA: Diagnosis not present

## 2018-06-17 DIAGNOSIS — I5032 Chronic diastolic (congestive) heart failure: Secondary | ICD-10-CM | POA: Diagnosis not present

## 2018-06-21 DIAGNOSIS — R1312 Dysphagia, oropharyngeal phase: Secondary | ICD-10-CM | POA: Diagnosis not present

## 2018-06-21 DIAGNOSIS — I8510 Secondary esophageal varices without bleeding: Secondary | ICD-10-CM | POA: Diagnosis not present

## 2018-06-21 DIAGNOSIS — R498 Other voice and resonance disorders: Secondary | ICD-10-CM | POA: Diagnosis not present

## 2018-06-21 DIAGNOSIS — R0602 Shortness of breath: Secondary | ICD-10-CM | POA: Diagnosis not present

## 2018-06-21 DIAGNOSIS — C3490 Malignant neoplasm of unspecified part of unspecified bronchus or lung: Secondary | ICD-10-CM | POA: Diagnosis not present

## 2018-06-21 DIAGNOSIS — M6281 Muscle weakness (generalized): Secondary | ICD-10-CM | POA: Diagnosis not present

## 2018-06-21 DIAGNOSIS — G20 Parkinson's disease: Secondary | ICD-10-CM | POA: Diagnosis not present

## 2018-06-21 DIAGNOSIS — J189 Pneumonia, unspecified organism: Secondary | ICD-10-CM | POA: Diagnosis not present

## 2018-06-22 DIAGNOSIS — G20 Parkinson's disease: Secondary | ICD-10-CM | POA: Diagnosis not present

## 2018-06-22 DIAGNOSIS — I8510 Secondary esophageal varices without bleeding: Secondary | ICD-10-CM | POA: Diagnosis not present

## 2018-06-22 DIAGNOSIS — M6281 Muscle weakness (generalized): Secondary | ICD-10-CM | POA: Diagnosis not present

## 2018-06-22 DIAGNOSIS — C3490 Malignant neoplasm of unspecified part of unspecified bronchus or lung: Secondary | ICD-10-CM | POA: Diagnosis not present

## 2018-06-22 DIAGNOSIS — J189 Pneumonia, unspecified organism: Secondary | ICD-10-CM | POA: Diagnosis not present

## 2018-06-22 DIAGNOSIS — R498 Other voice and resonance disorders: Secondary | ICD-10-CM | POA: Diagnosis not present

## 2018-06-22 DIAGNOSIS — R1312 Dysphagia, oropharyngeal phase: Secondary | ICD-10-CM | POA: Diagnosis not present

## 2018-06-22 DIAGNOSIS — R0602 Shortness of breath: Secondary | ICD-10-CM | POA: Diagnosis not present

## 2018-06-23 DIAGNOSIS — R1312 Dysphagia, oropharyngeal phase: Secondary | ICD-10-CM | POA: Diagnosis not present

## 2018-06-23 DIAGNOSIS — C3490 Malignant neoplasm of unspecified part of unspecified bronchus or lung: Secondary | ICD-10-CM | POA: Diagnosis not present

## 2018-06-23 DIAGNOSIS — R498 Other voice and resonance disorders: Secondary | ICD-10-CM | POA: Diagnosis not present

## 2018-06-23 DIAGNOSIS — M6281 Muscle weakness (generalized): Secondary | ICD-10-CM | POA: Diagnosis not present

## 2018-06-23 DIAGNOSIS — I8510 Secondary esophageal varices without bleeding: Secondary | ICD-10-CM | POA: Diagnosis not present

## 2018-06-23 DIAGNOSIS — J189 Pneumonia, unspecified organism: Secondary | ICD-10-CM | POA: Diagnosis not present

## 2018-06-23 DIAGNOSIS — R0602 Shortness of breath: Secondary | ICD-10-CM | POA: Diagnosis not present

## 2018-06-23 DIAGNOSIS — G20 Parkinson's disease: Secondary | ICD-10-CM | POA: Diagnosis not present

## 2018-06-24 ENCOUNTER — Inpatient Hospital Stay (HOSPITAL_COMMUNITY)
Admission: EM | Admit: 2018-06-24 | Discharge: 2018-07-08 | DRG: 872 | Disposition: E | Payer: Medicare Other | Attending: Family Medicine | Admitting: Family Medicine

## 2018-06-24 ENCOUNTER — Other Ambulatory Visit: Payer: Self-pay

## 2018-06-24 ENCOUNTER — Emergency Department (HOSPITAL_COMMUNITY): Payer: Medicare Other

## 2018-06-24 ENCOUNTER — Encounter (HOSPITAL_COMMUNITY): Payer: Self-pay | Admitting: Emergency Medicine

## 2018-06-24 DIAGNOSIS — K529 Noninfective gastroenteritis and colitis, unspecified: Secondary | ICD-10-CM | POA: Diagnosis not present

## 2018-06-24 DIAGNOSIS — Z66 Do not resuscitate: Secondary | ICD-10-CM | POA: Diagnosis present

## 2018-06-24 DIAGNOSIS — N183 Chronic kidney disease, stage 3 unspecified: Secondary | ICD-10-CM | POA: Diagnosis present

## 2018-06-24 DIAGNOSIS — K219 Gastro-esophageal reflux disease without esophagitis: Secondary | ICD-10-CM | POA: Diagnosis present

## 2018-06-24 DIAGNOSIS — K56609 Unspecified intestinal obstruction, unspecified as to partial versus complete obstruction: Secondary | ICD-10-CM | POA: Diagnosis present

## 2018-06-24 DIAGNOSIS — I252 Old myocardial infarction: Secondary | ICD-10-CM

## 2018-06-24 DIAGNOSIS — I5032 Chronic diastolic (congestive) heart failure: Secondary | ICD-10-CM | POA: Diagnosis present

## 2018-06-24 DIAGNOSIS — Z833 Family history of diabetes mellitus: Secondary | ICD-10-CM

## 2018-06-24 DIAGNOSIS — M810 Age-related osteoporosis without current pathological fracture: Secondary | ICD-10-CM | POA: Diagnosis not present

## 2018-06-24 DIAGNOSIS — J439 Emphysema, unspecified: Secondary | ICD-10-CM | POA: Diagnosis not present

## 2018-06-24 DIAGNOSIS — K5730 Diverticulosis of large intestine without perforation or abscess without bleeding: Secondary | ICD-10-CM | POA: Diagnosis not present

## 2018-06-24 DIAGNOSIS — R109 Unspecified abdominal pain: Secondary | ICD-10-CM

## 2018-06-24 DIAGNOSIS — C799 Secondary malignant neoplasm of unspecified site: Secondary | ICD-10-CM | POA: Diagnosis present

## 2018-06-24 DIAGNOSIS — Z7189 Other specified counseling: Secondary | ICD-10-CM

## 2018-06-24 DIAGNOSIS — R338 Other retention of urine: Secondary | ICD-10-CM | POA: Diagnosis present

## 2018-06-24 DIAGNOSIS — Z515 Encounter for palliative care: Secondary | ICD-10-CM

## 2018-06-24 DIAGNOSIS — G20 Parkinson's disease: Secondary | ICD-10-CM | POA: Diagnosis present

## 2018-06-24 DIAGNOSIS — R140 Abdominal distension (gaseous): Secondary | ICD-10-CM | POA: Diagnosis not present

## 2018-06-24 DIAGNOSIS — I130 Hypertensive heart and chronic kidney disease with heart failure and stage 1 through stage 4 chronic kidney disease, or unspecified chronic kidney disease: Secondary | ICD-10-CM | POA: Diagnosis present

## 2018-06-24 DIAGNOSIS — Z7983 Long term (current) use of bisphosphonates: Secondary | ICD-10-CM

## 2018-06-24 DIAGNOSIS — R0902 Hypoxemia: Secondary | ICD-10-CM | POA: Diagnosis not present

## 2018-06-24 DIAGNOSIS — R0602 Shortness of breath: Secondary | ICD-10-CM | POA: Diagnosis not present

## 2018-06-24 DIAGNOSIS — E860 Dehydration: Secondary | ICD-10-CM | POA: Diagnosis not present

## 2018-06-24 DIAGNOSIS — E871 Hypo-osmolality and hyponatremia: Secondary | ICD-10-CM | POA: Diagnosis not present

## 2018-06-24 DIAGNOSIS — Z79899 Other long term (current) drug therapy: Secondary | ICD-10-CM

## 2018-06-24 DIAGNOSIS — Z9981 Dependence on supplemental oxygen: Secondary | ICD-10-CM | POA: Diagnosis not present

## 2018-06-24 DIAGNOSIS — Z888 Allergy status to other drugs, medicaments and biological substances status: Secondary | ICD-10-CM

## 2018-06-24 DIAGNOSIS — E785 Hyperlipidemia, unspecified: Secondary | ICD-10-CM | POA: Diagnosis not present

## 2018-06-24 DIAGNOSIS — E1122 Type 2 diabetes mellitus with diabetic chronic kidney disease: Secondary | ICD-10-CM | POA: Diagnosis not present

## 2018-06-24 DIAGNOSIS — I10 Essential (primary) hypertension: Secondary | ICD-10-CM | POA: Diagnosis present

## 2018-06-24 DIAGNOSIS — R339 Retention of urine, unspecified: Secondary | ICD-10-CM | POA: Diagnosis not present

## 2018-06-24 DIAGNOSIS — Z8249 Family history of ischemic heart disease and other diseases of the circulatory system: Secondary | ICD-10-CM

## 2018-06-24 DIAGNOSIS — I2510 Atherosclerotic heart disease of native coronary artery without angina pectoris: Secondary | ICD-10-CM | POA: Diagnosis not present

## 2018-06-24 DIAGNOSIS — J9611 Chronic respiratory failure with hypoxia: Secondary | ICD-10-CM | POA: Diagnosis present

## 2018-06-24 DIAGNOSIS — J449 Chronic obstructive pulmonary disease, unspecified: Secondary | ICD-10-CM | POA: Diagnosis not present

## 2018-06-24 DIAGNOSIS — C3490 Malignant neoplasm of unspecified part of unspecified bronchus or lung: Secondary | ICD-10-CM | POA: Diagnosis present

## 2018-06-24 DIAGNOSIS — Z90710 Acquired absence of both cervix and uterus: Secondary | ICD-10-CM

## 2018-06-24 DIAGNOSIS — Z7982 Long term (current) use of aspirin: Secondary | ICD-10-CM

## 2018-06-24 DIAGNOSIS — I491 Atrial premature depolarization: Secondary | ICD-10-CM | POA: Diagnosis not present

## 2018-06-24 DIAGNOSIS — Z87891 Personal history of nicotine dependence: Secondary | ICD-10-CM | POA: Diagnosis not present

## 2018-06-24 DIAGNOSIS — E119 Type 2 diabetes mellitus without complications: Secondary | ICD-10-CM

## 2018-06-24 DIAGNOSIS — R110 Nausea: Secondary | ICD-10-CM | POA: Diagnosis not present

## 2018-06-24 DIAGNOSIS — Z85118 Personal history of other malignant neoplasm of bronchus and lung: Secondary | ICD-10-CM

## 2018-06-24 DIAGNOSIS — A419 Sepsis, unspecified organism: Principal | ICD-10-CM | POA: Diagnosis present

## 2018-06-24 LAB — CBC
HCT: 39.2 % (ref 36.0–46.0)
Hemoglobin: 12.4 g/dL (ref 12.0–15.0)
MCH: 25.9 pg — ABNORMAL LOW (ref 26.0–34.0)
MCHC: 31.6 g/dL (ref 30.0–36.0)
MCV: 82 fL (ref 78.0–100.0)
Platelets: 266 10*3/uL (ref 150–400)
RBC: 4.78 MIL/uL (ref 3.87–5.11)
RDW: 15.9 % — ABNORMAL HIGH (ref 11.5–15.5)
WBC: 18.9 10*3/uL — ABNORMAL HIGH (ref 4.0–10.5)

## 2018-06-24 LAB — COMPREHENSIVE METABOLIC PANEL
ALT: 8 U/L (ref 0–44)
AST: 19 U/L (ref 15–41)
Albumin: 3 g/dL — ABNORMAL LOW (ref 3.5–5.0)
Alkaline Phosphatase: 104 U/L (ref 38–126)
Anion gap: 11 (ref 5–15)
BUN: 13 mg/dL (ref 8–23)
CO2: 25 mmol/L (ref 22–32)
Calcium: 8.7 mg/dL — ABNORMAL LOW (ref 8.9–10.3)
Chloride: 92 mmol/L — ABNORMAL LOW (ref 98–111)
Creatinine, Ser: 1.15 mg/dL — ABNORMAL HIGH (ref 0.44–1.00)
GFR calc Af Amer: 50 mL/min — ABNORMAL LOW (ref >60)
GFR calc non Af Amer: 43 mL/min — ABNORMAL LOW (ref >60)
Glucose, Bld: 141 mg/dL — ABNORMAL HIGH (ref 70–99)
Potassium: 4.8 mmol/L (ref 3.5–5.1)
Sodium: 128 mmol/L — ABNORMAL LOW (ref 135–145)
Total Bilirubin: 0.5 mg/dL (ref 0.3–1.2)
Total Protein: 7.5 g/dL (ref 6.5–8.1)

## 2018-06-24 LAB — LIPASE, BLOOD: Lipase: 42 U/L (ref 11–51)

## 2018-06-24 MED ORDER — MORPHINE SULFATE (PF) 4 MG/ML IV SOLN
4.0000 mg | Freq: Once | INTRAVENOUS | Status: AC
  Administered 2018-06-24: 4 mg via INTRAVENOUS
  Filled 2018-06-24: qty 1

## 2018-06-24 MED ORDER — IOPAMIDOL (ISOVUE-300) INJECTION 61%
100.0000 mL | Freq: Once | INTRAVENOUS | Status: AC | PRN
  Administered 2018-06-24: 80 mL via INTRAVENOUS

## 2018-06-24 MED ORDER — ONDANSETRON HCL 4 MG/2ML IJ SOLN
4.0000 mg | Freq: Once | INTRAMUSCULAR | Status: AC
  Administered 2018-06-24: 4 mg via INTRAVENOUS
  Filled 2018-06-24: qty 2

## 2018-06-24 NOTE — ED Triage Notes (Signed)
Pt C/O abdominal pain that started 2 weeks ago. Pt was D/C from the hospital 3 days ago for a bowel obstruction. Pt C/O nausea but denies vomiting. Last BM was yesterday.

## 2018-06-24 NOTE — ED Notes (Signed)
Pt was informed that we need a urine sample. Pt attempted to urinate, but could not.

## 2018-06-24 NOTE — ED Provider Notes (Addendum)
[facility] EMERGENCY DEPARTMENT Provider Note   CSN: [id number] Arrival date & time: 07/01/2018  2007     History   Chief Complaint Chief Complaint  Patient presents with  . Abdominal Pain    HPI Angela Lawson is a 84 y.o. female.  HPI Patient with abdominal pain.  Has had bowel obstruction that she got out of the hospital 2 or 3 weeks ago for.  Thought to be either due to adhesions or inflammation.  Does have metastatic lung cancer but does not go to the abdomen.  States she never really got much better.  States she still has nausea and her abdomen is been more distended.  Last bowel movement was yesterday.  Still passing some gas.  Pain is dull.  It is worse in her lower abdomen.  Mild shortness of breath.  Chronic lung issues however.  No fevers. Past Medical History:  Diagnosis Date  . Adenocarcinoma of lung ([place])    Left lung 2009, resected  . Anginal pain ([place])   . Arthritis   . Back pain   . CHF (congestive heart failure) ([place])   . COPD (chronic obstructive pulmonary disease) ([place])   . Diverticulitis   . Dyslipidemia   . Essential hypertension   . H/O ventral hernia   . Non-obstructive CAD    a. 04/2015 NSTEMI/Cath: LAD 10p, LCX 26m, RCA 41m, 20d, EF 35-40 w/ apical ballooning.  . On home O2    3L N/C  . Osteoporosis   . Osteoporosis 11/04/2015   Managed by Dr. Legrand Rams   . Parkinson's disease ([place])   . Shortness of breath   . Takotsubo cardiomyopathy    a. 04/2015 Echo: EF 45-50%, mid-dist anterior/apical/inferoapical HK w/ hyperdynamic base. Gr 1 DD, mild AI, mild-mod MR, triv TR, PASP 23mmHg;  b. 04/2015 LV gram: Ef 35-40% w/ apical ballooning.  . Type II diabetes mellitus ([place])   . Ventricular bigeminy    a. 04/2015 in setting of NSTEMI/Takotsubo.    Patient Active Problem List   Diagnosis Date Noted  . Black tarry stools 06/03/2018  . Pneumonitis   . Abdominal pain   . Enteritis   . Partial small bowel obstruction ([place])   . Abdominal distention 05/25/2018    . SBO (small bowel obstruction) ([place])   . HCAP (healthcare-associated pneumonia) 05/24/2018  . CAD (coronary artery disease) 05/24/2018  . Parkinson's disease ([place]) 05/24/2018  . Hyponatremia 05/24/2018  . Respiratory failure ([place]) 02/06/2018  . Acute respiratory failure ([place]) 12/04/2017  . Community acquired pneumonia of left lung 07/19/2017  . Critical lower limb ischemia 07/07/2017  . Counseling regarding advanced care planning and goals of care 05/15/2017  . Acute renal injury ([place]) 04/04/2017  . Chest pain 04/04/2017  . Nausea and vomiting 04/04/2017  . COPD with acute exacerbation ([place]) 04/25/2016  . Acute on chronic respiratory failure with hypoxia ([place]) 04/25/2016  . SOB (shortness of breath)   . Osteoporosis 11/04/2015  . Chronic diastolic CHF (congestive heart failure) ([place]) 08/01/2015  . CHF exacerbation ([place]) 07/09/2015  . Essential hypertension 04/19/2015  . Type II diabetes mellitus ([place]) 04/19/2015  . Dyslipidemia 04/19/2015  . History of lung cancer 04/19/2015  . Takotsubo cardiomyopathy 04/19/2015  . Ventricular bigeminy 04/19/2015  . NSTEMI (non-ST elevated myocardial infarction) ([place]) 04/19/2015  . Stress-induced cardiomyopathy 04/18/2015  . Elevated troponin 04/16/2015  . COPD exacerbation ([place]) 04/16/2015  . CKD (chronic kidney disease) stage 3, GFR 30-59 ml/min (HCC) 04/16/2015  . Herpes genitalis in  women 11/24/2014  . Toe fracture 09/28/2014  . Non-traumatic tear of right rotator cuff 11/30/2013  . Pain in joint, shoulder region 11/30/2013  . Muscle weakness (generalized) 11/30/2013  . Bursitis of left shoulder 02/09/2013  . Adenocarcinoma of lung ([place]) 08/20/2012    Past Surgical History:  Procedure Laterality Date  . ABDOMINAL HYSTERECTOMY    . BIOPSY  06/03/2018   Procedure: BIOPSY;  Surgeon: Daneil Dolin, MD;  Location: [facility] SU[facility];  Service: Endoscopy;;  gastric   . CARDIAC CATHETERIZATION N/A 04/17/2015   Procedure: Left Heart Cath and  Coronary Angiography;  Surgeon: Troy Sine, MD;  Location: [facility];  Service: Cardiovascular;  Laterality: N/A;  . CHOLECYSTECTOMY    . COLONOSCOPY N/A 09/18/2014   Dr. Oneida Alar: diverticulosis throughout the colon, two tubular adenomas removed, internal hemorrhoids. next TCS in 10-15 years if benefits outweigh risks  . ECTOPIC PREGNANCY SURGERY    . ESOPHAGOGASTRODUODENOSCOPY (EGD) WITH PROPOFOL N/A 06/03/2018   Procedure: ESOPHAGOGASTRODUODENOSCOPY (EGD) WITH PROPOFOL;  Surgeon: Daneil Dolin, MD;  Location: [facility];  Service: Endoscopy;  Laterality: N/A;  . INCISIONAL HERNIA REPAIR N/A 08/26/2013   Procedure: HERNIA REPAIR INCISIONAL WITH MESH;  Surgeon: Jamesetta So, MD;  Location: [facility] ORS;  Service: General;  Laterality: N/A;  . IR FLUORO GUIDE PORT INSERTION RIGHT  04/30/2017  . IR US GUIDE VASC ACCESS RIGHT  04/30/2017  . LUNG CANCER SURGERY    . MALONEY DILATION  06/03/2018   Procedure: MALONEY DILATION;  Surgeon: Daneil Dolin, MD;  Location: [facility];  Service: Endoscopy;;  . VIDEO BRONCHOSCOPY WITH ENDOBRONCHIAL NAVIGATION N/A 04/23/2017   Procedure: VIDEO BRONCHOSCOPY WITH ENDOBRONCHIAL NAVIGATION;  Surgeon: Melrose Nakayama, MD;  Location: [facility];  Service: Thoracic;  Laterality: N/A;  . VIDEO BRONCHOSCOPY WITH ENDOBRONCHIAL ULTRASOUND N/A 04/23/2017   Procedure: VIDEO BRONCHOSCOPY WITH ENDOBRONCHIAL ULTRASOUND;  Surgeon: Melrose Nakayama, MD;  Location: [facility];  Service: Thoracic;  Laterality: N/A;     OB History   None      Home Medications    Prior to Admission medications   Medication Sig Start Date End Date Taking? Authorizing Provider  acetaminophen (ACETAMINOPHEN 8 HOUR) 650 MG CR tablet Take 650 mg by mouth every 4 (four) hours as needed for pain.   Yes [provider]  albuterol (PROAIR HFA) 108 (90 BASE) MCG/ACT inhaler Inhale 2 puffs into the lungs every 4 (four) hours as needed for wheezing or shortness of breath.   Yes  [provider]  alendronate (FOSAMAX) 70 MG tablet Take 70 mg by mouth every Friday.    Yes [provider]  antiseptic oral rinse (BIOTENE) LIQD 15 mLs by Mouth Rinse route 2 (two) times daily.   Yes [provider]  aspirin EC 81 MG tablet Take 81 mg by mouth daily.   Yes [provider]  Atezolizumab (TECENTRIQ IV) Inject into the vein every 21 ( twenty-one) days. Every 3 weeks    Yes [provider]  atorvastatin (LIPITOR) 40 MG tablet Take 40 mg by mouth at bedtime.    Yes [provider]  buPROPion (WELLBUTRIN SR) 150 MG 12 hr tablet Take 150 mg by mouth 2 (two) times daily.    Yes [provider]  carbidopa-levodopa (SINEMET IR) 25-100 MG tablet Take 1 tablet by mouth 2 (two) times daily.   Yes [provider]  cholecalciferol (VITAMIN D) 1000 units tablet Take 1,000 Units by mouth daily.  Yes [provider]  dronabinol (MARINOL) 2.5 MG capsule Take 2.5 mg by mouth 2 (two) times daily.   Yes [provider]  furosemide (LASIX) 20 MG tablet Take 1 tablet (20 mg total) by mouth daily. 06/07/18  Yes Johnson, Clanford L, MD  gabapentin (NEURONTIN) 300 MG capsule Take 600 mg by mouth at bedtime.    Yes [provider]  ipratropium-albuterol (DUONEB) 0.5-2.5 (3) MG/3ML SOLN Inhale 3 mLs into the lungs 4 (four) times daily.    Yes [provider]  LANTUS SOLOSTAR 100 UNIT/ML Solostar Pen Inject 7 Units into the skin at bedtime. 06/04/18  Yes Johnson, Clanford L, MD  lidocaine-prilocaine (EMLA) cream Apply 1 application topically daily as needed. 06/04/18  Yes Johnson, Clanford L, MD  losartan (COZAAR) 50 MG tablet Take 50 mg by mouth daily. 02/22/18  Yes [provider]  metoprolol succinate (TOPROL-XL) 25 MG 24 hr tablet Take 1 tablet (25 mg total) by mouth daily. 06/26/15  Yes Jettie Booze, MD  mometasone-formoterol [facility]) 200-5 MCG/ACT AERO Inhale 2 puffs into the lungs 2  (two) times daily. 02/08/18  Yes Kathie Dike, MD  nitroGLYCERIN (NITROSTAT) 0.4 MG SL tablet Place 0.4 mg under the tongue every 5 (five) minutes as needed for chest pain.   Yes [provider]  omeprazole (PRILOSEC) 20 MG capsule Take 20 mg by mouth 2 (two) times daily. 04/23/18  Yes [provider]  ondansetron (ZOFRAN ODT) 4 MG disintegrating tablet Take 1 tablet (4 mg total) by mouth every 8 (eight) hours as needed for nausea or vomiting. 05/22/18  Yes Francine Graven, DO  OXYGEN Inhale 3 L into the lungs continuous.   Yes [provider]  polyethylene glycol powder (GLYCOLAX/MIRALAX) powder Take 17 g by mouth daily as needed.   Yes [provider]  potassium chloride (KLOR-CON 10) 10 MEQ tablet TAKE 1 TABLET (10 MEQ TOTAL) BY MOUTH DAILY. 06/07/18  Yes Johnson, Clanford L, MD  rOPINIRole (REQUIP) 0.5 MG tablet Take 1 tablet (0.5 mg total) by mouth at bedtime. 06/04/18  Yes Johnson, Clanford L, MD  sodium chloride (OCEAN) 0.65 % SOLN nasal spray Place 1 spray into both nostrils every 3 (three) hours as needed for congestion. 06/04/18  Yes Johnson, Clanford L, MD  zolpidem (AMBIEN) 5 MG tablet Take 5 mg by mouth at bedtime as needed. 06/16/18  Yes [provider]  B-D ULTRAFINE III SHORT PEN 31G X 8 MM MISC See admin instructions. 12/27/17   [provider]    Family History Family History  Problem Relation Age of Onset  . Diabetes Mother   . Hypertension Mother   . Diabetes Father   . Asthma Unknown   . Cancer Unknown   . Heart attack Sister        X2  . Colon cancer Neg Hx     Social History Social History   Tobacco Use  . Smoking status: Former Smoker    Years: 20.00    Types: Cigarettes    Last attempt to quit: 08/13/2006    Years since quitting: 11.8  . Smokeless tobacco: Never Used  Substance Use Topics  . Alcohol use: No    Alcohol/week: 0.0 oz  . Drug use: No     Allergies   Lisinopril   Review of  Systems Review of Systems  Constitutional: Positive for appetite change.  HENT: Negative for congestion.   Respiratory: Positive for shortness of breath.   Cardiovascular: Negative for chest pain.  Gastrointestinal: Positive for abdominal distention, abdominal pain and nausea.  Genitourinary: Negative for flank pain.  Musculoskeletal: Negative for back pain.  Skin: Negative for rash.  Neurological: Negative for numbness.  Psychiatric/Behavioral: Negative for confusion.     Physical Exam Updated Vital Signs BP (!) 173/84 (BP Location: Right Arm)   Pulse 78   Temp 97.7 F (36.5 C) (Oral)   Resp 18   Ht 5\' 4"  (1.626 m)   Wt 75.8 kg (167 lb)   SpO2 98%   BMI 28.67 kg/m   Physical Exam  Constitutional: She appears well-developed.  HENT:  Head: Normocephalic.  Cardiovascular: Normal rate.  Pulmonary/Chest:  Somewhat harsh breath sounds.  Abdominal:  Some distention.  Fullness worse in the lower abdomen.  No hernia palpated.  Neurological: She is alert.  Skin: Skin is warm. Capillary refill takes less than 2 seconds.     ED Treatments / Results  Labs (all labs ordered are listed, but only abnormal results are displayed) Labs Reviewed  COMPREHENSIVE METABOLIC PANEL - Abnormal; Notable for the following components:      Result Value   Sodium 128 (*)    Chloride 92 (*)    Glucose, Bld 141 (*)    Creatinine, Ser 1.15 (*)    Calcium 8.7 (*)    Albumin 3.0 (*)    GFR calc non Af Amer 43 (*)    GFR calc Af Amer 50 (*)    All other components within normal limits  CBC - Abnormal; Notable for the following components:   WBC 18.9 (*)    MCH 25.9 (*)    RDW 15.9 (*)    All other components within normal limits  LIPASE, BLOOD  URINALYSIS, ROUTINE W REFLEX MICROSCOPIC    EKG None  Radiology Ct Chest W Contrast  Result Date: 07/16/18 CLINICAL DATA:  Abdominal pain starting 2 weeks ago. Patient was recently discharged from hospital 3 days ago for bowel obstruction.  Patient complains of nausea but denies vomiting. Last bowel movement was yesterday. EXAM: CT CHEST, ABDOMEN, AND PELVIS WITH CONTRAST TECHNIQUE: Multidetector CT imaging of the chest, abdomen and pelvis was performed following the standard protocol during bolus administration of intravenous contrast. CONTRAST:  41mL ISOVUE-300 IOPAMIDOL (ISOVUE-300) INJECTION 61% COMPARISON:  05/24/2018 chest CT, 05/25/2018 CT abdomen and pelvis FINDINGS: CT CHEST FINDINGS Cardiovascular: Conventional branch pattern of the great vessels off the aortic arch. There appears be complete occlusion of the left common carotid artery beyond the origin. Moderate atherosclerosis of the nonaneurysmal thoracic aorta. No dissection or aneurysm. Heart size is normal without pericardial effusion. Scattered left main and three-vessel coronary arteriosclerosis is noted. Satisfactory opacification of the pulmonary arteries without large central pulmonary embolus noted. Technique not tailored toward assessment of pulmonary emboli. Mediastinum/Nodes: Normal sized thyroid gland with cystic nodules bilaterally measuring up to 14 mm on the left and 9 mm on the right. No mediastinal nor hilar lymphadenopathy. No axillary adenopathy. Patent trachea and mainstem bronchi. Esophagus is unremarkable. Lungs/Pleura: Stable postsurgical change just inferior to the left hilum. Stable thick rimmed extrapleural cavity involving the medial left lung base at the azygoesphageal recess. Diffuse centrilobular emphysema is again demonstrated. At the right lung apex is a stable 16 x 9 mm spiculation, nonspecific but likely to represent apical scarring. Redemonstration of pulmonary consolidation superimposed on centrilobular emphysema along the dependent aspect of the left lower lobe and posterior left upper lobe. There has been some clearing of the ground-glass opacities from the right lower lobe since  prior. No effusion or pneumothorax. Musculoskeletal: Multiple bilateral  chronic rib deformities from prior surgery and/or trauma. No acute osseous abnormality is noted. CT ABDOMEN PELVIS FINDINGS Hepatobiliary: Cholecystectomy. Likely reservoir effect accounting for intra and extrahepatic ductal dilatation. No choledocholithiasis. No space-occupying mass of the liver. Pancreas: Normal Spleen: Normal Adrenals/Urinary Tract: Normal bilateral adrenal glands. Symmetric cortical enhancement of both kidneys without nephrolithiasis, enhancing mass nor hydroureteronephrosis. Tiny too small to characterize cysts are noted of the kidneys. The urinary bladder is physiologically distended without focal mural thickening or calculi. Stomach/Bowel: The stomach is decompressed in appearance. There is a small hiatal hernia. The duodenal sweep and ligament of Treitz appear unremarkable. Mild fluid-filled distention of jejunal loops with transmural thickening and adjacent mesenteric edema is seen in the right lower quadrant adjacent to mild fluid-filled distended small bowel. Findings may represent stigmata of recent small-bowel obstruction. Small bowel enteritis is not entirely excluded. Extensive descending and sigmoid diverticulosis without acute diverticulitis. Vascular/Lymphatic: Atherosclerosis of the abdominal aorta with patent branch vessels. No aneurysm, dissection or significant stenosis. No lymphadenopathy by CT size criteria. Reproductive: Hysterectomy.  No adnexal mass. Other: No free air nor free fluid. Musculoskeletal: No acute osseous abnormality. Thoracolumbar spondylosis and bilateral hip joint osteoarthritis. IMPRESSION: Chest CT: 1. Chronic occlusion of the left common carotid artery just distal to its take-off the aortic arch. 2. Moderate atherosclerosis of the nonaneurysmal thoracic aorta. 3. Left main and three-vessel coronary arteriosclerosis. 4. Centrilobular emphysema is redemonstrated with superimposed pneumonia involving the dependent aspect of the left upper and lower lobes  similar in appearance to prior. There has been clearing of ground-glass infiltrates from the right lung since prior. 5. No acute pulmonary embolus. CT AP: 1. Edema of the small bowel mesentery adjacent to mild fluid-filled distention of small bowel loops in the right lower quadrant. Findings may represent small bowel enteritis. No mechanical bowel obstruction is identified. Alternatively this may represent stigmata of recent small-bowel obstruction that is resolving. 2. Left-sided colonic diverticulosis without acute diverticulitis. 3. Status post cholecystectomy and hysterectomy. 4. Tiny too small to characterize renal cysts. Electronically Signed   By: Ashley Royalty M.D.   On: 2018/07/05 00:11   Ct Abdomen Pelvis W Contrast  Result Date: 07/05/2018 CLINICAL DATA:  Abdominal pain starting 2 weeks ago. Patient was recently discharged from hospital 3 days ago for bowel obstruction. Patient complains of nausea but denies vomiting. Last bowel movement was yesterday. EXAM: CT CHEST, ABDOMEN, AND PELVIS WITH CONTRAST TECHNIQUE: Multidetector CT imaging of the chest, abdomen and pelvis was performed following the standard protocol during bolus administration of intravenous contrast. CONTRAST:  45mL ISOVUE-300 IOPAMIDOL (ISOVUE-300) INJECTION 61% COMPARISON:  05/24/2018 chest CT, 05/25/2018 CT abdomen and pelvis FINDINGS: CT CHEST FINDINGS Cardiovascular: Conventional branch pattern of the great vessels off the aortic arch. There appears be complete occlusion of the left common carotid artery beyond the origin. Moderate atherosclerosis of the nonaneurysmal thoracic aorta. No dissection or aneurysm. Heart size is normal without pericardial effusion. Scattered left main and three-vessel coronary arteriosclerosis is noted. Satisfactory opacification of the pulmonary arteries without large central pulmonary embolus noted. Technique not tailored toward assessment of pulmonary emboli. Mediastinum/Nodes: Normal sized thyroid  gland with cystic nodules bilaterally measuring up to 14 mm on the left and 9 mm on the right. No mediastinal nor hilar lymphadenopathy. No axillary adenopathy. Patent trachea and mainstem bronchi. Esophagus is unremarkable. Lungs/Pleura: Stable postsurgical change just inferior to the left hilum. Stable thick rimmed extrapleural cavity involving the medial left lung  base at the azygoesphageal recess. Diffuse centrilobular emphysema is again demonstrated. At the right lung apex is a stable 16 x 9 mm spiculation, nonspecific but likely to represent apical scarring. Redemonstration of pulmonary consolidation superimposed on centrilobular emphysema along the dependent aspect of the left lower lobe and posterior left upper lobe. There has been some clearing of the ground-glass opacities from the right lower lobe since prior. No effusion or pneumothorax. Musculoskeletal: Multiple bilateral chronic rib deformities from prior surgery and/or trauma. No acute osseous abnormality is noted. CT ABDOMEN PELVIS FINDINGS Hepatobiliary: Cholecystectomy. Likely reservoir effect accounting for intra and extrahepatic ductal dilatation. No choledocholithiasis. No space-occupying mass of the liver. Pancreas: Normal Spleen: Normal Adrenals/Urinary Tract: Normal bilateral adrenal glands. Symmetric cortical enhancement of both kidneys without nephrolithiasis, enhancing mass nor hydroureteronephrosis. Tiny too small to characterize cysts are noted of the kidneys. The urinary bladder is physiologically distended without focal mural thickening or calculi. Stomach/Bowel: The stomach is decompressed in appearance. There is a small hiatal hernia. The duodenal sweep and ligament of Treitz appear unremarkable. Mild fluid-filled distention of jejunal loops with transmural thickening and adjacent mesenteric edema is seen in the right lower quadrant adjacent to mild fluid-filled distended small bowel. Findings may represent stigmata of recent  small-bowel obstruction. Small bowel enteritis is not entirely excluded. Extensive descending and sigmoid diverticulosis without acute diverticulitis. Vascular/Lymphatic: Atherosclerosis of the abdominal aorta with patent branch vessels. No aneurysm, dissection or significant stenosis. No lymphadenopathy by CT size criteria. Reproductive: Hysterectomy.  No adnexal mass. Other: No free air nor free fluid. Musculoskeletal: No acute osseous abnormality. Thoracolumbar spondylosis and bilateral hip joint osteoarthritis. IMPRESSION: Chest CT: 1. Chronic occlusion of the left common carotid artery just distal to its take-off the aortic arch. 2. Moderate atherosclerosis of the nonaneurysmal thoracic aorta. 3. Left main and three-vessel coronary arteriosclerosis. 4. Centrilobular emphysema is redemonstrated with superimposed pneumonia involving the dependent aspect of the left upper and lower lobes similar in appearance to prior. There has been clearing of ground-glass infiltrates from the right lung since prior. 5. No acute pulmonary embolus. CT AP: 1. Edema of the small bowel mesentery adjacent to mild fluid-filled distention of small bowel loops in the right lower quadrant. Findings may represent small bowel enteritis. No mechanical bowel obstruction is identified. Alternatively this may represent stigmata of recent small-bowel obstruction that is resolving. 2. Left-sided colonic diverticulosis without acute diverticulitis. 3. Status post cholecystectomy and hysterectomy. 4. Tiny too small to characterize renal cysts. Electronically Signed   By: Ashley Royalty M.D.   On: 2018-07-10 00:11    Procedures Procedures (including critical care time)  Medications Ordered in ED Medications  sodium chloride 0.9 % bolus 500 mL (500 mLs Intravenous New Bag/Given 07/10/2018 0011)  ondansetron (ZOFRAN) injection 4 mg (4 mg Intravenous Given 06/07/2018 2229)  morphine 4 MG/ML injection 4 mg (4 mg Intravenous Given 07/07/2018 2230)   iopamidol (ISOVUE-300) 61 % injection 100 mL (80 mLs Intravenous Contrast Given 07/02/2018 2313)  morphine 4 MG/ML injection 4 mg (4 mg Intravenous Given 06/08/2018 2340)     Initial Impression / Assessment and Plan / ED Course  I have reviewed the triage vital signs and the nursing notes.  Pertinent labs & imaging results that were available during my care of the patient were reviewed by me and considered in my medical decision making (see chart for details).     Patient abdominal pain.  Nausea and vomiting.  History of bowel obstructions.  White count is elevated.  Continued  pain.  CT scan pending.    CT scan showed enteritis versus resolving bowel obstruction.  Continued pain however.  I think with comorbidities and recent obstruction with some continued pain patient would benefit from an overnight observation for serial abdominal exams potentially recheck the white count.  Will discuss with hospitalist.  Patient is required repeat dose of pain medicine. Although I called Noemi Chapel is in the same thing earlier or thermostat although a low  Final Clinical Impressions(s) / ED Diagnoses   Final diagnoses:  Abdominal pain, unspecified abdominal location  Hyponatremia    ED Discharge Orders    None       Davonna Belling, MD 07-16-2018 Roderic Palau    Davonna Belling, MD 16-Jul-2018 [phone]

## 2018-06-25 ENCOUNTER — Encounter (HOSPITAL_COMMUNITY): Payer: Self-pay | Admitting: *Deleted

## 2018-06-25 ENCOUNTER — Other Ambulatory Visit: Payer: Self-pay

## 2018-06-25 DIAGNOSIS — I5032 Chronic diastolic (congestive) heart failure: Secondary | ICD-10-CM | POA: Diagnosis present

## 2018-06-25 DIAGNOSIS — K529 Noninfective gastroenteritis and colitis, unspecified: Secondary | ICD-10-CM | POA: Diagnosis not present

## 2018-06-25 DIAGNOSIS — C3490 Malignant neoplasm of unspecified part of unspecified bronchus or lung: Secondary | ICD-10-CM | POA: Diagnosis not present

## 2018-06-25 DIAGNOSIS — E785 Hyperlipidemia, unspecified: Secondary | ICD-10-CM | POA: Diagnosis present

## 2018-06-25 DIAGNOSIS — R109 Unspecified abdominal pain: Secondary | ICD-10-CM

## 2018-06-25 DIAGNOSIS — Z9981 Dependence on supplemental oxygen: Secondary | ICD-10-CM | POA: Diagnosis not present

## 2018-06-25 DIAGNOSIS — G20 Parkinson's disease: Secondary | ICD-10-CM | POA: Diagnosis present

## 2018-06-25 DIAGNOSIS — R339 Retention of urine, unspecified: Secondary | ICD-10-CM | POA: Diagnosis present

## 2018-06-25 DIAGNOSIS — Z7189 Other specified counseling: Secondary | ICD-10-CM | POA: Diagnosis not present

## 2018-06-25 DIAGNOSIS — I2510 Atherosclerotic heart disease of native coronary artery without angina pectoris: Secondary | ICD-10-CM | POA: Diagnosis present

## 2018-06-25 DIAGNOSIS — Z66 Do not resuscitate: Secondary | ICD-10-CM | POA: Diagnosis present

## 2018-06-25 DIAGNOSIS — K219 Gastro-esophageal reflux disease without esophagitis: Secondary | ICD-10-CM | POA: Diagnosis present

## 2018-06-25 DIAGNOSIS — E860 Dehydration: Secondary | ICD-10-CM | POA: Diagnosis present

## 2018-06-25 DIAGNOSIS — R338 Other retention of urine: Secondary | ICD-10-CM

## 2018-06-25 DIAGNOSIS — E871 Hypo-osmolality and hyponatremia: Secondary | ICD-10-CM | POA: Diagnosis present

## 2018-06-25 DIAGNOSIS — J449 Chronic obstructive pulmonary disease, unspecified: Secondary | ICD-10-CM | POA: Diagnosis present

## 2018-06-25 DIAGNOSIS — Z515 Encounter for palliative care: Secondary | ICD-10-CM | POA: Diagnosis not present

## 2018-06-25 DIAGNOSIS — E1122 Type 2 diabetes mellitus with diabetic chronic kidney disease: Secondary | ICD-10-CM | POA: Diagnosis present

## 2018-06-25 DIAGNOSIS — I252 Old myocardial infarction: Secondary | ICD-10-CM | POA: Diagnosis not present

## 2018-06-25 DIAGNOSIS — K56609 Unspecified intestinal obstruction, unspecified as to partial versus complete obstruction: Secondary | ICD-10-CM | POA: Diagnosis present

## 2018-06-25 DIAGNOSIS — A419 Sepsis, unspecified organism: Secondary | ICD-10-CM | POA: Diagnosis present

## 2018-06-25 DIAGNOSIS — I130 Hypertensive heart and chronic kidney disease with heart failure and stage 1 through stage 4 chronic kidney disease, or unspecified chronic kidney disease: Secondary | ICD-10-CM | POA: Diagnosis present

## 2018-06-25 DIAGNOSIS — N183 Chronic kidney disease, stage 3 (moderate): Secondary | ICD-10-CM | POA: Diagnosis present

## 2018-06-25 DIAGNOSIS — C799 Secondary malignant neoplasm of unspecified site: Secondary | ICD-10-CM | POA: Diagnosis present

## 2018-06-25 DIAGNOSIS — Z87891 Personal history of nicotine dependence: Secondary | ICD-10-CM | POA: Diagnosis not present

## 2018-06-25 DIAGNOSIS — M810 Age-related osteoporosis without current pathological fracture: Secondary | ICD-10-CM | POA: Diagnosis present

## 2018-06-25 DIAGNOSIS — J9611 Chronic respiratory failure with hypoxia: Secondary | ICD-10-CM | POA: Diagnosis present

## 2018-06-25 LAB — OSMOLALITY: Osmolality: 275 mOsm/kg (ref 275–295)

## 2018-06-25 LAB — URINALYSIS, ROUTINE W REFLEX MICROSCOPIC
Bilirubin Urine: NEGATIVE
Glucose, UA: NEGATIVE mg/dL
Hgb urine dipstick: NEGATIVE
Ketones, ur: NEGATIVE mg/dL
Leukocytes, UA: NEGATIVE
Nitrite: NEGATIVE
Protein, ur: NEGATIVE mg/dL
Specific Gravity, Urine: 1.021 (ref 1.005–1.030)
pH: 7 (ref 5.0–8.0)

## 2018-06-25 LAB — BASIC METABOLIC PANEL
Anion gap: 12 (ref 5–15)
BUN: 12 mg/dL (ref 8–23)
CO2: 23 mmol/L (ref 22–32)
Calcium: 8.4 mg/dL — ABNORMAL LOW (ref 8.9–10.3)
Chloride: 92 mmol/L — ABNORMAL LOW (ref 98–111)
Creatinine, Ser: 1 mg/dL (ref 0.44–1.00)
GFR calc Af Amer: 59 mL/min — ABNORMAL LOW (ref >60)
GFR calc non Af Amer: 51 mL/min — ABNORMAL LOW (ref >60)
Glucose, Bld: 145 mg/dL — ABNORMAL HIGH (ref 70–99)
Potassium: 4.9 mmol/L (ref 3.5–5.1)
Sodium: 127 mmol/L — ABNORMAL LOW (ref 135–145)

## 2018-06-25 LAB — TSH: TSH: 0.78 u[IU]/mL (ref 0.350–4.500)

## 2018-06-25 LAB — GLUCOSE, CAPILLARY
Glucose-Capillary: 146 mg/dL — ABNORMAL HIGH (ref 70–99)
Glucose-Capillary: 156 mg/dL — ABNORMAL HIGH (ref 70–99)
Glucose-Capillary: 139 mg/dL — ABNORMAL HIGH (ref 70–99)
Glucose-Capillary: 113 mg/dL — ABNORMAL HIGH (ref 70–99)
Glucose-Capillary: 75 mg/dL (ref 70–99)

## 2018-06-25 LAB — SODIUM, URINE, RANDOM: Sodium, Ur: 85 mmol/L

## 2018-06-25 LAB — OSMOLALITY, URINE: Osmolality, Ur: 420 mOsm/kg (ref 300–900)

## 2018-06-25 MED ORDER — INSULIN ASPART 100 UNIT/ML ~~LOC~~ SOLN
0.0000 [IU] | Freq: Three times a day (TID) | SUBCUTANEOUS | Status: DC
  Administered 2018-06-25: 1 [IU] via SUBCUTANEOUS

## 2018-06-25 MED ORDER — VITAMIN D 1000 UNITS PO TABS
1000.0000 [IU] | ORAL_TABLET | Freq: Every day | ORAL | Status: DC
  Administered 2018-06-25: 1000 [IU] via ORAL
  Filled 2018-06-25 (×2): qty 1

## 2018-06-25 MED ORDER — ACETAMINOPHEN 650 MG RE SUPP: 650.0000 mg | Freq: Four times a day (QID) | RECTAL | Status: DC | PRN

## 2018-06-25 MED ORDER — SALINE SPRAY 0.65 % NA SOLN
1.0000 | NASAL | Status: DC | PRN
  Filled 2018-06-25: qty 44

## 2018-06-25 MED ORDER — SODIUM CHLORIDE 0.9% FLUSH: 3.0000 mL | INTRAVENOUS | Status: DC | PRN

## 2018-06-25 MED ORDER — CARBIDOPA-LEVODOPA 25-100 MG PO TABS
1.0000 | ORAL_TABLET | Freq: Two times a day (BID) | ORAL | Status: DC
  Administered 2018-06-25: 1 via ORAL
  Filled 2018-06-25 (×2): qty 1

## 2018-06-25 MED ORDER — METOPROLOL SUCCINATE ER 25 MG PO TB24
25.0000 mg | ORAL_TABLET | Freq: Every day | ORAL | Status: DC
  Administered 2018-06-25: 25 mg via ORAL
  Filled 2018-06-25 (×2): qty 1

## 2018-06-25 MED ORDER — PIPERACILLIN-TAZOBACTAM 3.375 G IVPB
3.3750 g | Freq: Three times a day (TID) | INTRAVENOUS | Status: DC
  Administered 2018-06-25 (×3): 3.375 g via INTRAVENOUS
  Filled 2018-06-25 (×3): qty 50

## 2018-06-25 MED ORDER — ROPINIROLE HCL 0.25 MG PO TABS
0.5000 mg | ORAL_TABLET | Freq: Every day | ORAL | Status: DC
  Filled 2018-06-25: qty 2

## 2018-06-25 MED ORDER — GABAPENTIN 300 MG PO CAPS
600.0000 mg | ORAL_CAPSULE | Freq: Every day | ORAL | Status: DC
  Filled 2018-06-25: qty 2

## 2018-06-25 MED ORDER — ASPIRIN EC 81 MG PO TBEC
81.0000 mg | DELAYED_RELEASE_TABLET | Freq: Every day | ORAL | Status: DC
  Administered 2018-06-25: 81 mg via ORAL
  Filled 2018-06-25: qty 1

## 2018-06-25 MED ORDER — ACETAMINOPHEN 325 MG PO TABS: 650.0000 mg | ORAL_TABLET | Freq: Four times a day (QID) | ORAL | Status: DC | PRN

## 2018-06-25 MED ORDER — NITROGLYCERIN 0.4 MG SL SUBL: 0.4000 mg | SUBLINGUAL_TABLET | SUBLINGUAL | Status: DC | PRN

## 2018-06-25 MED ORDER — BOOST / RESOURCE BREEZE PO LIQD CUSTOM
1.0000 | Freq: Three times a day (TID) | ORAL | Status: DC
  Administered 2018-06-25 (×2): 1 via ORAL

## 2018-06-25 MED ORDER — PANTOPRAZOLE SODIUM 40 MG PO TBEC
40.0000 mg | DELAYED_RELEASE_TABLET | Freq: Every day | ORAL | Status: DC
  Administered 2018-06-25: 40 mg via ORAL
  Filled 2018-06-25: qty 1

## 2018-06-25 MED ORDER — BUPROPION HCL ER (SR) 150 MG PO TB12
150.0000 mg | ORAL_TABLET | Freq: Two times a day (BID) | ORAL | Status: DC
  Administered 2018-06-25: 150 mg via ORAL
  Filled 2018-06-25 (×2): qty 1

## 2018-06-25 MED ORDER — LOSARTAN POTASSIUM 25 MG PO TABS
50.0000 mg | ORAL_TABLET | Freq: Every day | ORAL | Status: DC
  Administered 2018-06-25: 50 mg via ORAL
  Filled 2018-06-25: qty 2

## 2018-06-25 MED ORDER — INSULIN ASPART 100 UNIT/ML ~~LOC~~ SOLN
0.0000 [IU] | SUBCUTANEOUS | Status: DC
  Administered 2018-06-25 (×2): 2 [IU] via SUBCUTANEOUS

## 2018-06-25 MED ORDER — ATORVASTATIN CALCIUM 40 MG PO TABS
40.0000 mg | ORAL_TABLET | Freq: Every day | ORAL | Status: DC
  Administered 2018-06-25: 40 mg via ORAL
  Filled 2018-06-25 (×2): qty 1

## 2018-06-25 MED ORDER — HYDRALAZINE HCL 20 MG/ML IJ SOLN
10.0000 mg | INTRAMUSCULAR | Status: DC | PRN
  Administered 2018-06-25: 10 mg via INTRAVENOUS
  Filled 2018-06-25: qty 1

## 2018-06-25 MED ORDER — SODIUM CHLORIDE 0.9% FLUSH
3.0000 mL | Freq: Two times a day (BID) | INTRAVENOUS | Status: DC
  Administered 2018-06-25 (×2): 3 mL via INTRAVENOUS

## 2018-06-25 MED ORDER — MOMETASONE FURO-FORMOTEROL FUM 200-5 MCG/ACT IN AERO
2.0000 | INHALATION_SPRAY | Freq: Two times a day (BID) | RESPIRATORY_TRACT | Status: DC
  Administered 2018-06-25: 2 via RESPIRATORY_TRACT
  Filled 2018-06-25: qty 8.8

## 2018-06-25 MED ORDER — SODIUM CHLORIDE 0.9 % IV BOLUS
500.0000 mL | Freq: Once | INTRAVENOUS | Status: AC
  Administered 2018-06-25: 500 mL via INTRAVENOUS

## 2018-06-25 MED ORDER — LORAZEPAM 2 MG/ML IJ SOLN: 1.0000 mg | INTRAMUSCULAR | Status: DC | PRN

## 2018-06-25 MED ORDER — SODIUM CHLORIDE 0.9 % IV SOLN: 250.0000 mL | INTRAVENOUS | Status: DC | PRN

## 2018-06-25 MED ORDER — ENOXAPARIN SODIUM 40 MG/0.4ML ~~LOC~~ SOLN
40.0000 mg | SUBCUTANEOUS | Status: DC
  Administered 2018-06-25: 40 mg via SUBCUTANEOUS
  Filled 2018-06-25: qty 0.4

## 2018-06-25 MED ORDER — BIOTENE DRY MOUTH MT LIQD: 15.0000 mL | Freq: Two times a day (BID) | OROMUCOSAL | Status: DC

## 2018-06-25 MED ORDER — ONDANSETRON HCL 4 MG/2ML IJ SOLN
4.0000 mg | Freq: Four times a day (QID) | INTRAMUSCULAR | Status: DC | PRN
  Administered 2018-06-25: 4 mg via INTRAVENOUS
  Filled 2018-06-25: qty 2

## 2018-06-25 MED ORDER — ONDANSETRON HCL 4 MG PO TABS: 4.0000 mg | ORAL_TABLET | Freq: Four times a day (QID) | ORAL | Status: DC | PRN

## 2018-06-25 MED ORDER — INSULIN ASPART 100 UNIT/ML ~~LOC~~ SOLN: 0.0000 [IU] | Freq: Every day | SUBCUTANEOUS | Status: DC

## 2018-06-25 MED ORDER — HYDROMORPHONE HCL 1 MG/ML IJ SOLN
0.5000 mg | INTRAMUSCULAR | Status: DC | PRN
  Administered 2018-06-25 (×5): 0.5 mg via INTRAVENOUS
  Filled 2018-06-25 (×5): qty 0.5

## 2018-06-25 MED ORDER — IPRATROPIUM-ALBUTEROL 0.5-2.5 (3) MG/3ML IN SOLN: 3.0000 mL | Freq: Four times a day (QID) | RESPIRATORY_TRACT | Status: DC | PRN

## 2018-06-26 LAB — BLOOD CULTURE ID PANEL (REFLEXED)

## 2018-06-26 NOTE — Progress Notes (Signed)
Funeral home arrived. Patient transferred to funeral home.

## 2018-06-26 NOTE — Discharge Summary (Signed)
Expiration Note  Angela Lawson  MR#: [id number]  DOB:13-Jul-1934  Date of Admission: 07-17-18 Date of Death: July 18, 2018 Pt was pronounced at 03/01/2106  Attending Physician:Mikie Misner Wynetta Emery MD  Patient's PCP: Rosita Fire, MD  Consults:  Palliative Medicine  Causes of Death: Enteritis Sepsis . Adenocarcinoma of lung ([place]) . CAD (coronary artery disease) . CKD (chronic kidney disease) stage 3, GFR 30-59 ml/min (HCC) . Parkinson's disease ([place]) . Hyponatremia . Enteritis . Essential hypertension . Acute urinary retention  Hospital Course: 84 y.o. female  with past medical history of stage IV lung adenocarcinoma, Parkinson's disease, CHF, DM, high blood pressure and cholesterol, stage III kidney disease, CAD, and recent discharge on 6/28 after treatment for aspiration pneumonia admitted on Jul 17, 2018 with abdominal pain with nausea and vomiting likely secondary to enteritis. Pt was  addmitted with symptoms of abdominal pain and enteritis and treated with IV antibiotics and supportive care.  She developed urinary retention and a foley catheter was placed. Palliative medicine was consulted and met with patient and family.  Pt continued to develop progressive symptoms of distress and not improving with maximal medical therapy and the patient's daughter made the decision to pursue full comfort care measures.  The patient expired on 07/18/18 and pronounced at Mar 01, 2106.   Signed: Garett Tetzloff 06/26/2018, 8:50 AM

## 2018-06-28 ENCOUNTER — Other Ambulatory Visit: Payer: Self-pay | Admitting: *Deleted

## 2018-06-28 LAB — CULTURE, BLOOD (ROUTINE X 2): Special Requests: ADEQUATE

## 2018-06-28 NOTE — Consult Note (Signed)
[facility] Liaison follow up.  Chart reviewed. Noted [name] expired in the hospital on 2018/07/01.    Angela Rolling, MSN-Ed, Angela Lawson,Angela Lawson [facility] Liaison [phone]

## 2018-06-30 ENCOUNTER — Ambulatory Visit (HOSPITAL_COMMUNITY): Payer: Medicare Other

## 2018-06-30 LAB — CULTURE, BLOOD (ROUTINE X 2)
Culture: NO GROWTH
Special Requests: ADEQUATE

## 2018-07-02 ENCOUNTER — Ambulatory Visit (HOSPITAL_COMMUNITY): Payer: Medicare Other

## 2018-07-02 ENCOUNTER — Ambulatory Visit (HOSPITAL_COMMUNITY): Payer: Medicare Other | Admitting: Hematology

## 2018-07-02 ENCOUNTER — Other Ambulatory Visit (HOSPITAL_COMMUNITY): Payer: Medicare Other

## 2018-07-08 NOTE — Progress Notes (Signed)
06/18/2018  8:51 PM  06-27-18 2:30 PM  Angela Lawson was seen and examined.  The H&P by the admitting provider, orders, imaging was reviewed.  Please see new orders.  Will continue to follow.  Pt developed acute urinary retention and foley was placed.   Vitals:   June 27, 2018 0856 06/27/18 1103  BP: (!) 169/78   Pulse: 97   Resp:    Temp:    SpO2:  95%   Results for orders placed or performed during the hospital encounter of 06/22/2018  Culture, blood (routine x 2)  Result Value Ref Range   Specimen Description RIGHT ANTECUBITAL    Special Requests      BOTTLES DRAWN AEROBIC AND ANAEROBIC Blood Culture adequate volume Performed at [facility], [address], [place], [place] [address]    Culture PENDING    Report Status PENDING   Culture, blood (routine x 2)  Result Value Ref Range   Specimen Description BLOOD LEFT ARM    Special Requests      BOTTLES DRAWN AEROBIC AND ANAEROBIC Blood Culture adequate volume Performed at [facility], [address] [place], [place] [address]    Culture PENDING    Report Status PENDING   Lipase, blood  Result Value Ref Range   Lipase 42 11 - 51 U/L  Comprehensive metabolic panel  Result Value Ref Range   Sodium 128 (L) 135 - 145 mmol/L   Potassium 4.8 3.5 - 5.1 mmol/L   Chloride 92 (L) 98 - 111 mmol/L   CO2 25 22 - 32 mmol/L   Glucose, Bld 141 (H) 70 - 99 mg/dL   BUN 13 8 - 23 mg/dL   Creatinine, Ser 1.15 (H) 0.44 - 1.00 mg/dL   Calcium 8.7 (L) 8.9 - 10.3 mg/dL   Total Protein 7.5 6.5 - 8.1 g/dL   Albumin 3.0 (L) 3.5 - 5.0 g/dL   AST 19 15 - 41 U/L   ALT 8 0 - 44 U/L   Alkaline Phosphatase 104 38 - 126 U/L   Total Bilirubin 0.5 0.3 - 1.2 mg/dL   GFR calc non Af Amer 43 (L) >60 mL/min   GFR calc Af Amer 50 (L) >60 mL/min   Anion gap 11 5 - 15  CBC  Result Value Ref Range   WBC 18.9 (H) 4.0 - 10.5 K/uL   RBC 4.78 3.87 - 5.11 MIL/uL   Hemoglobin 12.4 12.0 - 15.0 g/dL   HCT 39.2 36.0 - 46.0 %   MCV 82.0 78.0 - 100.0 fL   MCH 25.9  (L) 26.0 - 34.0 pg   MCHC 31.6 30.0 - 36.0 g/dL   RDW 15.9 (H) 11.5 - 15.5 %   Platelets 266 150 - 400 K/uL  Urinalysis, Routine w reflex microscopic  Result Value Ref Range   Color, Urine YELLOW YELLOW   APPearance CLEAR CLEAR   Specific Gravity, Urine 1.021 1.005 - 1.030   pH 7.0 5.0 - 8.0   Glucose, UA NEGATIVE NEGATIVE mg/dL   Hgb urine dipstick NEGATIVE NEGATIVE   Bilirubin Urine NEGATIVE NEGATIVE   Ketones, ur NEGATIVE NEGATIVE mg/dL   Protein, ur NEGATIVE NEGATIVE mg/dL   Nitrite NEGATIVE NEGATIVE   Leukocytes, UA NEGATIVE NEGATIVE  Basic metabolic panel  Result Value Ref Range   Sodium 127 (L) 135 - 145 mmol/L   Potassium 4.9 3.5 - 5.1 mmol/L   Chloride 92 (L) 98 - 111 mmol/L   CO2 23 22 - 32 mmol/L   Glucose, Bld  145 (H) 70 - 99 mg/dL   BUN 12 8 - 23 mg/dL   Creatinine, Ser 1.00 0.44 - 1.00 mg/dL   Calcium 8.4 (L) 8.9 - 10.3 mg/dL   GFR calc non Af Amer 51 (L) >60 mL/min   GFR calc Af Amer 59 (L) >60 mL/min   Anion gap 12 5 - 15  TSH  Result Value Ref Range   TSH 0.780 0.350 - 4.500 uIU/mL  Osmolality, urine  Result Value Ref Range   Osmolality, Ur 420 300 - 900 mOsm/kg  Osmolality  Result Value Ref Range   Osmolality 275 275 - 295 mOsm/kg  Sodium, urine, random  Result Value Ref Range   Sodium, Ur 85 mmol/L  Glucose, capillary  Result Value Ref Range   Glucose-Capillary 146 (H) 70 - 99 mg/dL   Comment 1 Notify RN    Comment 2 Document in Chart   Glucose, capillary  Result Value Ref Range   Glucose-Capillary 156 (H) 70 - 99 mg/dL   Comment 1 Notify RN    Comment 2 Document in Chart   Glucose, capillary  Result Value Ref Range   Glucose-Capillary 139 (H) 70 - 99 mg/dL   Comment 1 Notify RN    Comment 2 Document in [place], MD [facility]

## 2018-07-08 NOTE — Progress Notes (Signed)
Came to assess patient after I was notified about low blood pressure readings with systolic in the 46-65 range.  Family members at bedside including healthcare power of attorney which is her daughter.  She states that patient has been doing worse throughout the course of the day and she is very minimally responsive and pale.  She appears to be gasping for breaths and family members have decided that they would not like any further aggressive care and would like to make her comfort measures only.  I have made adjustments to the medication list with Dilaudid and Ativan ordered for comfort care.  DNR order has been placed.

## 2018-07-08 NOTE — ED Notes (Signed)
Pt unable to urinate, c/o abdominal pain. Hospitalist gave ok for in and out cath. Pt emptied 1050 mL. No complaints at this time.

## 2018-07-08 NOTE — Progress Notes (Signed)
Patient died Two RNs verified.

## 2018-07-08 NOTE — Consult Note (Signed)
Consultation Note Date: 2018-07-09   Patient Name: Angela Lawson  DOB: Jan 27, 1934  MRN: [id number]  Age / Sex: 84 y.o., female  PCP: Angela Fire, MD Referring Physician: Murlean Iba, MD  Reason for Consultation: Establishing goals of care  HPI/Patient Profile: 84 y.o. female  with past medical history of stage IV lung adenocarcinoma, Parkinson's disease, CHF, DM, high blood pressure and cholesterol, stage III kidney disease, CAD, and recent discharge on 6/28 after treatment for aspiration pneumonia admitted on 07/05/2018 with abdominal pain with nausea and vomiting likely secondary to enteritis.   Clinical Assessment and Goals of Care: Angela Lawson is resting quietly in bed with family at bedside praying with her.  Meeting with daughter, Angela Lawson in the hallway.  We talked about Angela Lawson's current health concerns.  Angela Lawson shares that Angela Lawson was just recently released from rehab, and spent a day or so at home but her abdominal pain became so bad that she felt like she had to come to the hospital.  I ask about oncology treatments, Angela Lawson states that at this point, if offered, they would continue immunotherapy.  Angela Lawson states that her mother is "very strong", and tells of her activities at home prior to rehab.  We talked about this possible small bowel obstruction, I question the possibility of surgery in the future.  Angela Lawson states that she has discussed this with her mother this morning, and they both agree to surgery if warranted. I discussed with Angela Lawson the possibility, "even if she were to die on the operating table?".    Angela Lawson states that her main concern for Angela Lawson and herself is pain.  We discussed pain management, the concern of slowing the bowel with too much pain medication.  We talked about 24 to 48 hours for outcomes, testing, starting and stopping of medications.  Conference with hospitalist  related to Angela Lawson meeting/plan of care.  Healthcare power of attorney HCPOA -daughter, Angela Lawson and Angela Lawson and Angela Lawson.  Healthcare power of attorney paperwork is noted in epic chart.   SUMMARY OF RECOMMENDATIONS   24 to 48 hours for outcomes. Agreeable to surgery if warranted. Continue CODE STATUS discussions.   Code Status/Advance Care Planning:  Full code -[facility] POA/AD paperwork noted in epic chart requesting to allow a natural death if terminal and incurable.  Symptom Management:   Per hospitalist, no additional needs at this time.  Palliative Prophylaxis:   Frequent Pain Assessment and Turn Reposition  Additional Recommendations (Limitations, Scope, Preferences):  Full Scope Treatment  Psycho-social/Spiritual:   Desire for further Chaplaincy support:no  Additional Recommendations: Caregiving  Support/Resources and Education on Hospice  Prognosis:   Unable to determine, based on outcomes.  Discharge Planning: To be determined, based on outcomes.      Primary Diagnoses: Present on Admission: . Adenocarcinoma of lung ([place]) . CAD (coronary artery disease) . CKD (chronic kidney disease) stage 3, GFR 30-59 ml/min (HCC) . Parkinson's disease ([place]) . Hyponatremia . Enteritis . Essential hypertension . Acute urinary retention  I have reviewed the medical record, interviewed the patient and family, and examined the patient. The following aspects are pertinent.  Past Medical History:  Diagnosis Date  . Adenocarcinoma of lung ([place])    Left lung 2009, resected  . Anginal pain ([place])   . Arthritis   . Back pain   . CHF (congestive heart failure) ([place])   . COPD (chronic obstructive pulmonary disease) ([place])   . Diverticulitis   . Dyslipidemia   . Essential hypertension   . H/O ventral hernia   . Non-obstructive CAD    a. 04/2015 NSTEMI/Cath: LAD 10p, LCX 77m, RCA 21m, 20d, EF 35-40 w/ apical ballooning.  . On home O2    3L N/C  . Osteoporosis   .  Osteoporosis 11/04/2015   Managed by Dr. Legrand Rams   . Parkinson's disease ([place])   . Shortness of breath   . Takotsubo cardiomyopathy    a. 04/2015 Echo: EF 45-50%, mid-dist anterior/apical/inferoapical HK w/ hyperdynamic base. Gr 1 DD, mild AI, mild-mod MR, triv TR, PASP 51mmHg;  b. 04/2015 LV gram: Ef 35-40% w/ apical ballooning.  . Type II diabetes mellitus ([place])   . Ventricular bigeminy    a. 04/2015 in setting of NSTEMI/Takotsubo.   Social History   Socioeconomic History  . Marital status: Widowed    Spouse name: Not on file  . Number of children: Not on file  . Years of education: Not on file  . Highest education level: Not on file  Occupational History  . Not on file  Social Needs  . Financial resource strain: Not on file  . Food insecurity:    Worry: Not on file    Inability: Not on file  . Transportation needs:    Medical: Not on file    Non-medical: Not on file  Tobacco Use  . Smoking status: Former Smoker    Years: 20.00    Types: Cigarettes    Last attempt to quit: 08/13/2006    Years since quitting: 11.8  . Smokeless tobacco: Never Used  Substance and Sexual Activity  . Alcohol use: No    Alcohol/week: 0.0 oz  . Drug use: No  . Sexual activity: Not Currently    Birth control/protection: Surgical  Lifestyle  . Physical activity:    Days per week: Not on file    Minutes per session: Not on file  . Stress: Not on file  Relationships  . Social connections:    Talks on phone: Not on file    Gets together: Not on file    Attends religious service: Not on file    Active member of club or organization: Not on file    Attends meetings of clubs or organizations: Not on file    Relationship status: Not on file  Other Topics Concern  . Not on file  Social History Narrative  . Not on file   Family History  Problem Relation Age of Onset  . Diabetes Mother   . Hypertension Mother   . Diabetes Father   . Asthma Unknown   . Cancer Unknown   . Heart attack Sister          X2  . Colon cancer Neg Hx    Scheduled Meds: . antiseptic oral rinse  15 mL Mouth Rinse BID  . aspirin EC  81 mg Oral Daily  . atorvastatin  40 mg Oral QHS  . buPROPion  150 mg Oral BID  . carbidopa-levodopa  1 tablet Oral BID  .  cholecalciferol  1,000 Units Oral Daily  . enoxaparin (LOVENOX) injection  40 mg Subcutaneous Q24H  . feeding supplement  1 Container Oral TID BM  . gabapentin  600 mg Oral QHS  . insulin aspart  0-5 Units Subcutaneous QHS  . insulin aspart  0-9 Units Subcutaneous TID WC  . losartan  50 mg Oral Daily  . metoprolol succinate  25 mg Oral Daily  . mometasone-formoterol  2 puff Inhalation BID  . pantoprazole  40 mg Oral Daily  . rOPINIRole  0.5 mg Oral QHS  . sodium chloride flush  3 mL Intravenous Q12H   Continuous Infusions: . sodium chloride    . piperacillin-tazobactam (ZOSYN)  IV Stopped (23-Jul-2018 1258)   PRN Meds:.sodium chloride, acetaminophen **OR** acetaminophen, hydrALAZINE, HYDROmorphone (DILAUDID) injection, ipratropium-albuterol, nitroGLYCERIN, ondansetron **OR** ondansetron (ZOFRAN) IV, sodium chloride, sodium chloride flush Medications Prior to Admission:  Prior to Admission medications   Medication Sig Start Date End Date Taking? Authorizing Provider  acetaminophen (ACETAMINOPHEN 8 HOUR) 650 MG CR tablet Take 650 mg by mouth every 4 (four) hours as needed for pain.   Yes [provider]  albuterol (PROAIR HFA) 108 (90 BASE) MCG/ACT inhaler Inhale 2 puffs into the lungs every 4 (four) hours as needed for wheezing or shortness of breath.   Yes [provider]  alendronate (FOSAMAX) 70 MG tablet Take 70 mg by mouth every Friday.    Yes [provider]  antiseptic oral rinse (BIOTENE) LIQD 15 mLs by Mouth Rinse route 2 (two) times daily.   Yes [provider]  aspirin EC 81 MG tablet Take 81 mg by mouth daily.   Yes [provider]  Atezolizumab (TECENTRIQ IV) Inject into the vein every 21 (  twenty-one) days. Every 3 weeks    Yes [provider]  atorvastatin (LIPITOR) 40 MG tablet Take 40 mg by mouth at bedtime.    Yes [provider]  buPROPion (WELLBUTRIN SR) 150 MG 12 hr tablet Take 150 mg by mouth 2 (two) times daily.    Yes [provider]  carbidopa-levodopa (SINEMET IR) 25-100 MG tablet Take 1 tablet by mouth 2 (two) times daily.   Yes [provider]  cholecalciferol (VITAMIN D) 1000 units tablet Take 1,000 Units by mouth daily.   Yes [provider]  dronabinol (MARINOL) 2.5 MG capsule Take 2.5 mg by mouth 2 (two) times daily.   Yes [provider]  furosemide (LASIX) 20 MG tablet Take 1 tablet (20 mg total) by mouth daily. 06/07/18  Yes Johnson, Clanford L, MD  gabapentin (NEURONTIN) 300 MG capsule Take 600 mg by mouth at bedtime.    Yes [provider]  ipratropium-albuterol (DUONEB) 0.5-2.5 (3) MG/3ML SOLN Inhale 3 mLs into the lungs 4 (four) times daily.    Yes [provider]  LANTUS SOLOSTAR 100 UNIT/ML Solostar Pen Inject 7 Units into the skin at bedtime. 06/04/18  Yes Johnson, Clanford L, MD  lidocaine-prilocaine (EMLA) cream Apply 1 application topically daily as needed. 06/04/18  Yes Johnson, Clanford L, MD  losartan (COZAAR) 50 MG tablet Take 50 mg by mouth daily. 02/22/18  Yes [provider]  metoprolol succinate (TOPROL-XL) 25 MG 24 hr tablet Take 1 tablet (25 mg total) by mouth daily. 06/26/15  Yes Jettie Booze, MD  mometasone-formoterol [facility]) 200-5 MCG/ACT AERO Inhale 2 puffs into the lungs 2 (two) times daily. 02/08/18  Yes Kathie Dike, MD  nitroGLYCERIN (NITROSTAT) 0.4 MG SL tablet Place 0.4 mg under  the tongue every 5 (five) minutes as needed for chest pain.   Yes [provider]  omeprazole (PRILOSEC) 20 MG capsule Take 20 mg by mouth 2 (two) times daily. 04/23/18  Yes [provider]  ondansetron (ZOFRAN ODT) 4 MG disintegrating tablet Take 1 tablet (4  mg total) by mouth every 8 (eight) hours as needed for nausea or vomiting. 05/22/18  Yes Francine Graven, DO  OXYGEN Inhale 3 L into the lungs continuous.   Yes [provider]  polyethylene glycol powder (GLYCOLAX/MIRALAX) powder Take 17 g by mouth daily as needed.   Yes [provider]  potassium chloride (KLOR-CON 10) 10 MEQ tablet TAKE 1 TABLET (10 MEQ TOTAL) BY MOUTH DAILY. 06/07/18  Yes Johnson, Clanford L, MD  rOPINIRole (REQUIP) 0.5 MG tablet Take 1 tablet (0.5 mg total) by mouth at bedtime. 06/04/18  Yes Johnson, Clanford L, MD  sodium chloride (OCEAN) 0.65 % SOLN nasal spray Place 1 spray into both nostrils every 3 (three) hours as needed for congestion. 06/04/18  Yes Johnson, Clanford L, MD  zolpidem (AMBIEN) 5 MG tablet Take 5 mg by mouth at bedtime as needed. 06/16/18  Yes [provider]  B-D ULTRAFINE III SHORT PEN 31G X 8 MM MISC See admin instructions. 12/27/17   [provider]   Allergies  Allergen Reactions  . Lisinopril Cough   Review of Systems  Unable to perform ROS: Age    Physical Exam  HENT:  Head: Atraumatic.  Cardiovascular: Normal rate.  Pulmonary/Chest: Effort normal. No respiratory distress.  Neurological: She is alert.  Skin: Skin is warm and dry.  Nursing note and vitals reviewed.   Vital Signs: BP (!) 169/78   Pulse 97   Temp (!) 96.2 F (35.7 C) (Axillary)   Resp 20   Ht 5\' 4"  (1.626 m)   Wt 75.8 kg (167 lb 1.7 oz)   SpO2 95%   BMI 28.68 kg/m  Pain Scale: 0-10 POSS *See Group Information*: 1-Acceptable,Awake and alert Pain Score: Asleep   SpO2: SpO2: 95 % O2 Device:SpO2: 95 % O2 Flow Rate: .O2 Flow Rate (L/min): 2 L/min  IO: Intake/output summary:   Intake/Output Summary (Last 24 hours) at 07/02/18 1526 Last data filed at July 02, 2018 0900 Gross per 24 hour  Intake 505.92 ml  Output 1050 ml  Net -544.08 ml    LBM: Last BM Date: 06/23/18 Baseline Weight: Weight: 75.8 kg (167 lb) Most recent  weight: Weight: 75.8 kg (167 lb 1.7 oz)     Palliative Assessment/Data:   Flowsheet Rows     Most Recent Value  Intake Tab  Referral Department  Hospitalist  Unit at Time of Referral  Med/Surg Unit  Palliative Care Primary Diagnosis  Cancer  Date Notified  July 02, 2018  Palliative Care Type  New Palliative care  Reason for referral  Clarify Goals of Care  Date of Admission  06/12/2018  Date first seen by Palliative Care  07/02/18  # of days Palliative referral response time  0 Day(s)  # of days IP prior to Palliative referral  1  Clinical Assessment  Palliative Performance Scale Score  30%  Pain Max last 24 hours  Not able to report  Pain Min Last 24 hours  Not able to report  Dyspnea Max Last 24 Hours  Not able to report  Dyspnea Min Last 24 hours  Not able to report  Psychosocial & Spiritual Assessment  Palliative Care Outcomes  Patient/Family meeting held?  Yes  Who was  at the meeting?  Patient at bedside      Time In: 1505 Time Out: 1540 Time Total: 35 minutes Greater than 50%  of this time was spent counseling and coordinating care related to the above assessment and plan.  Signed by: Drue Novel, NP   Please contact Palliative Medicine Team phone at [phone] for questions and concerns.  For individual provider: See Shea Evans

## 2018-07-08 NOTE — Progress Notes (Signed)
ANTIBIOTIC CONSULT NOTE-Preliminary  Pharmacy Consult for zosyn Indication: intra-abdominal infection  Allergies  Allergen Reactions  . Lisinopril Cough    Patient Measurements: Height: 5\' 4"  (162.6 cm) Weight: 167 lb (75.8 kg) IBW/kg (Calculated) : 54.7 Adjusted Body Weight:   Vital Signs: Temp: 97.7 F (36.5 C) (07/18 2010) Temp Source: Oral (07/18 2010) BP: 173/84 (07/19 0000) Pulse Rate: 78 (07/19 0000)  Labs: Recent Labs    06/17/2018 2209  WBC 18.9*  HGB 12.4  PLT 266  CREATININE 1.15*    Estimated Creatinine Clearance: 37.6 mL/min (A) (by C-G formula based on SCr of 1.15 mg/dL (H)).  No results for input(s): VANCOTROUGH, VANCOPEAK, VANCORANDOM, GENTTROUGH, GENTPEAK, GENTRANDOM, TOBRATROUGH, TOBRAPEAK, TOBRARND, AMIKACINPEAK, AMIKACINTROU, AMIKACIN in the last 72 hours.   Microbiology: No results found for this or any previous visit (from the past 720 hour(s)).  Medical History: Past Medical History:  Diagnosis Date  . Adenocarcinoma of lung ([place])    Left lung 2009, resected  . Anginal pain ([place])   . Arthritis   . Back pain   . CHF (congestive heart failure) ([place])   . COPD (chronic obstructive pulmonary disease) ([place])   . Diverticulitis   . Dyslipidemia   . Essential hypertension   . H/O ventral hernia   . Non-obstructive CAD    a. 04/2015 NSTEMI/Cath: LAD 10p, LCX 89m, RCA 29m, 20d, EF 35-40 w/ apical ballooning.  . On home O2    3L N/C  . Osteoporosis   . Osteoporosis 11/04/2015   Managed by Dr. Legrand Rams   . Parkinson's disease ([place])   . Shortness of breath   . Takotsubo cardiomyopathy    a. 04/2015 Echo: EF 45-50%, mid-dist anterior/apical/inferoapical HK w/ hyperdynamic base. Gr 1 DD, mild AI, mild-mod MR, triv TR, PASP 11mmHg;  b. 04/2015 LV gram: Ef 35-40% w/ apical ballooning.  . Type II diabetes mellitus ([place])   . Ventricular bigeminy    a. 04/2015 in setting of NSTEMI/Takotsubo.    Medications:  Scheduled:  . antiseptic oral rinse  15 mL  Mouth Rinse BID  . aspirin EC  81 mg Oral Daily  . atorvastatin  40 mg Oral QHS  . buPROPion  150 mg Oral BID  . carbidopa-levodopa  1 tablet Oral BID  . cholecalciferol  1,000 Units Oral Daily  . enoxaparin (LOVENOX) injection  40 mg Subcutaneous Q24H  . gabapentin  600 mg Oral QHS  . insulin aspart  0-5 Units Subcutaneous QHS  . insulin aspart  0-9 Units Subcutaneous Q4H  . losartan  50 mg Oral Daily  . metoprolol succinate  25 mg Oral Daily  . mometasone-formoterol  2 puff Inhalation BID  . pantoprazole  40 mg Oral Daily  . rOPINIRole  0.5 mg Oral QHS  . sodium chloride flush  3 mL Intravenous Q12H   Infusions:  . sodium chloride    . piperacillin-tazobactam (ZOSYN)  IV     PRN: sodium chloride, acetaminophen **OR** acetaminophen, hydrALAZINE, ipratropium-albuterol, nitroGLYCERIN, ondansetron **OR** ondansetron (ZOFRAN) IV, sodium chloride, sodium chloride flush Anti-infectives (From admission, onward)   Start     Dose/Rate Route Frequency Ordered Stop   Jul 18, 2018 0200  piperacillin-tazobactam (ZOSYN) IVPB 3.375 g     3.375 g 12.5 mL/hr over 240 Minutes Intravenous Every 8 hours Jul 18, 2018 0153        Assessment: 84 yo female c/o n/v secondary to enteritis.  Starting zosyn, may get surgery consult in AM.    Plan:  Preliminary review of pertinent patient  information completed.  Protocol will be initiated with dose(s) of zosyn 3.375 grams Q8.  Forestine Na clinical pharmacist will complete review during morning rounds to assess patient and finalize treatment regimen if needed.  Shanikka Wonders, [place], [place] 2018/07/23,1:54 AM

## 2018-07-08 NOTE — Progress Notes (Signed)
Blood culture result received. Dr. Manuella Ghazi notified. No orders received. Patient actively dying.

## 2018-07-08 NOTE — ED Notes (Signed)
Pt states she is unable to give urine sample at this time. 

## 2018-07-08 NOTE — Progress Notes (Signed)
Patient expired at 2109.  Death certificate will be completed.

## 2018-07-08 NOTE — Progress Notes (Signed)
Patient unable to void and complains of lower abdominal pain. Bladder scan reveals 545 cc urine retention. MD has been E-Paged.

## 2018-07-08 NOTE — Progress Notes (Signed)
Patient was pronounced @ 2107. Hospitalist notified. [place] donors notified and [employer]control called. Awaiting for funeral home at this time.

## 2018-07-08 NOTE — Care Management Note (Signed)
Case Management Note  Patient Details  Name: Angela Lawson MRN: [id number] Date of Birth: February 27, 1934  Subjective/Objective:     Admitted with enteritis. Pt from home, lives with daughter. Pt was discharged from [facility] 1-2 days ago. Pt had appeals DC and lost. Pt active with [facility]. Pt says she wants nursing but not PT. Pt says she does not feel she is ready to be at home, does not feel ind enough.                Action/Plan: Pt will DC home with resumption of [facility] nursing. Juliann Pulse, [facility] rep, aware of admission and plan. Pt referred for [facility] CM services as she is has had multiple re-admissions. CM has asked that nursing get pt out of bed and have her ambulate every shift to prevent deconditioning.   Expected Discharge Date:     06/26/2018             Expected Discharge Plan:  [place]  In-House Referral:  NA  Discharge planning Services  CM Consult  Post Acute Care Choice:  Home Health, Resumption of Svcs/PTA Provider Choice offered to:  Patient  HH Arranged:  RN [facility] Agency:  [place]  Status of Service:     If discussed at [place] of Stay Meetings, dates discussed:    Additional Comments:  Sherald Barge, RN 2018/07/02, 12:56 PM

## 2018-07-08 NOTE — Consult Note (Addendum)
   [facility] CM Inpatient Consult   2018/07/19  Angela Lawson April 05, 1934 [id number]    Referral received from inpatient [facility] for [place] Management services.  Telephone call to patient's room and spoke with daughter- Angela Lawson.   Explained [facility] Care Management services and Angela Lawson (daughter) agreeable and verbal consent obtained.   Daughter reports that patient lives with her but daughter works split shifts. Confirmed Primary Care MD is Dr. Legrand Rams. No concerns with transportation. Concerns with affording inhalers however. Will make referral to [facility] team. Confirmed best contact number for patient is daughter Angela Lawson at [phone].  Discussed that [place] Management will not interfere or replace services provided by home health. Provided [facility] Care Management telephone number and writer's contact information.  Spoke with inpatient RNCM to make aware [facility] will follow post discharge. Also discussed that patient appears that she could benefit from palliative care consult for goals of care due to increased hospitalizations, decline, age, and co-morbities.  Will continue to follow and make appropriate [facility] Care Management referrals once disposition is known.  Mrs. Carneiro has a medical history of stage IV lung cancer, Parkinson's disease, CHF, HTN, HLD, CKD, CAD. Has had multiple hospitalizations. Unplanned hospital readmission risk score is 47% (extreme).   Angela Rolling, MSN-Ed, RN,BSN [facility] Liaison [phone]

## 2018-07-08 NOTE — Progress Notes (Signed)
Initial Nutrition Assessment  DOCUMENTATION CODES:  Not applicable  INTERVENTION:  Continue Boost Breeze po TID, each supplement provides 250 kcal and 9 grams of protein  Once abd pain/nausea improves, can switch to ENsure.   NUTRITION DIAGNOSIS:  Inadequate oral intake related to poor appetite, dysgeusia, acute illness(Enteritis) as evidenced by per patient/family report.   GOAL:  Patient will meet greater than or equal to 90% of their needs  MONITOR:  PO intake, Supplement acceptance, Diet advancement, Labs, Weight trends, I & O's  REASON FOR ASSESSMENT:  Malnutrition Screening Tool    ASSESSMENT:  84 y/o female PMHx Stage IV Lung cancer on immunotherapy, Parkinsons, CHF, DM2, HTN, CKD3, CAD. Recently admitted 6/17-6/28 w/ HCAP and SBO vs ileus. Discharged to SNF. Returns to ED due to worsening abd distension, pain and N/V. Still having BMs. Workup shows either resolving SBO or enteritis. Admitted for management.   Patient seen alongside daughter. The patient herself is hoarse and lethargic. Most information obtained from daughter.   [facility]. She had an episode of emesis that night and asked daughter to call EMS.   [facility], the patient did not eat well and only "picked over" her food.  She says this was due to the food not tasting good. Daughter notes dysgeusia only began during June hospitalization, making it unlikely related to immunotherapy medication. She does not sound to have received Boost/Ensure at [facility], rather daughter says "she drank mostly Pedialyte". Daughter says the last time pt ate well was last Saturday when she brought pt mashed potatoes.   Pt also with general anorexia. Pt had reported at [facility] appt 7/5 that she had no appetite and  requested medication to improve this. She was prescribed marinol at that time. Today, patient says the medication only helped "a tiny bit". Marinol currently on hold due to possibly  being r/t n/v  Wt wise, daughter reports UBW of 177 lbs. Per chart, pts wt has been stable from December 2018 up until this past June when she developed her SBO. Her admit wt reflects a loss of 7.5 lbs x 1 month (4.3%).    At this time, patient has minimal interest in eating. She still has severe abdominal pain, nausea and has been sleeping most of day. Encouraged patient to try to eat something as she will not improve w/o nutrition. Given nausea and severe abdominal pain, would continue the resource breeze and hold off on the higher fat Ensure. Patient apparently likes the breeze.   Physical Exam: Deferred at this time.   Labs: BGs stable at 140-160, WBC:18.9, Albumin:3.0, Creat: 1.15, Na:128 Meds: Vit D, Sinemet, Resource Breeze-TID, PRN dialudid, IV abx, IVF  Recent Labs  Lab 06/22/2018 2209  NA 128*  K 4.8  CL 92*  CO2 25  BUN 13  CREATININE 1.15*  CALCIUM 8.7*  GLUCOSE 141*   NUTRITION - FOCUSED PHYSICAL EXAM: Deferred at this time  Diet Order:   Diet Order           Diet full liquid Room service appropriate? Yes; Fluid consistency: Thin  Diet effective now         EDUCATION NEEDS:  No education needs have been identified at this time  Skin:  Skin Assessment: Reviewed RN Assessment  Last BM:  7/17  Height:  Ht Readings from Last 1 Encounters:  07/03/2018 5\' 4"  (1.626 m)   Weight:  Wt Readings from Last 1 Encounters:  06/26/18 167 lb 1.7  oz (75.8 kg)   Ideal Body Weight:  54.54 kg  BMI:  Body mass index is 28.68 kg/m.  Estimated Nutritional Needs:  Kcal:  1750-1950 kcals (23-26 kcal/kg bw or 32-36g/kg ibw) Protein:  83-98g Pro (1.1-1.3 g/kg bw) Fluid:  1.8-2 L fluid (6ml/kcal)  Burtis Junes RD, LDN, CNSC Clinical Nutrition Available Tues-Sat via Pager: [phone] 1:22 PM

## 2018-07-08 NOTE — H&P (Signed)
History and Physical    CHIZARA MENA NTI:[id number] DOB: February 05, 1934 DOA: 06/23/2018  PCP: Rosita Fire, MD   Patient coming from: Home  Chief Complaint: Abdominal pain, N/V  HPI: Angela Lawson is a 84 y.o. female with medical history significant for stage IV lung adenocarcinoma, Parkinson's disease, chronic diastolic CHF, dyslipidemia, hypertension, type 2 diabetes, CKD stage III, CAD, and recent discharge on 6/28 after treatment for aspiration pneumonia as well as small bowel obstruction versus ileus who returns to the emergency department complaints of ongoing generalized abdominal pain as well as some nausea with episodes of emesis.  She is noted to have some worsening abdominal distention as well and denies any aggravating or alleviating factors.  Her last bowel movement was yesterday and it was formed and she is passing flatus.  Her pain is worse in the right lower quadrant of her abdomen.  Her throat is still hoarse from recent EGD as well as NG tube placement. She denies any fevers or chills.  No chest pain or shortness of breath.   ED Course: Vital signs are stable.  Laboratory data remarkable for worsening leukocytosis of 18,900, sodium 128 and chloride 92, creatinine 1.15 which is stable, and glucose 141.  CT of the abdomen and pelvis demonstrates what appears to be either sequela of resolving small bowel obstruction versus enteritis particularly to the right lower quadrant.  CT of the chest with contrast was also performed for screening which was recommended by oncology with no acute findings currently aside from chronic vascular disease.  She has been given some IV fluid and pain medications thus far.  Review of Systems: All others reviewed and otherwise negative.  Past Medical History:  Diagnosis Date  . Adenocarcinoma of lung ([place])    Left lung 2009, resected  . Anginal pain ([place])   . Arthritis   . Back pain   . CHF (congestive heart failure) ([place])   . COPD (chronic  obstructive pulmonary disease) ([place])   . Diverticulitis   . Dyslipidemia   . Essential hypertension   . H/O ventral hernia   . Non-obstructive CAD    a. 04/2015 NSTEMI/Cath: LAD 10p, LCX 37m, RCA 69m, 20d, EF 35-40 w/ apical ballooning.  . On home O2    3L N/C  . Osteoporosis   . Osteoporosis 11/04/2015   Managed by Dr. Legrand Rams   . Parkinson's disease ([place])   . Shortness of breath   . Takotsubo cardiomyopathy    a. 04/2015 Echo: EF 45-50%, mid-dist anterior/apical/inferoapical HK w/ hyperdynamic base. Gr 1 DD, mild AI, mild-mod MR, triv TR, PASP 41mmHg;  b. 04/2015 LV gram: Ef 35-40% w/ apical ballooning.  . Type II diabetes mellitus ([place])   . Ventricular bigeminy    a. 04/2015 in setting of NSTEMI/Takotsubo.    Past Surgical History:  Procedure Laterality Date  . ABDOMINAL HYSTERECTOMY    . BIOPSY  06/03/2018   Procedure: BIOPSY;  Surgeon: Daneil Dolin, MD;  [facility];  Service: Endoscopy;;  gastric   . CARDIAC CATHETERIZATION N/A 04/17/2015   Procedure: Left Heart Cath and Coronary Angiography;  Surgeon: Troy Sine, MD;  Location: [facility];  Service: Cardiovascular;  Laterality: N/A;  . CHOLECYSTECTOMY    . COLONOSCOPY N/A 09/18/2014   Dr. Oneida Alar: diverticulosis throughout the colon, two tubular adenomas removed, internal hemorrhoids. next TCS in 10-15 years if benefits outweigh risks  . ECTOPIC PREGNANCY SURGERY    . ESOPHAGOGASTRODUODENOSCOPY (EGD) WITH PROPOFOL N/A  06/03/2018   Procedure: ESOPHAGOGASTRODUODENOSCOPY (EGD) WITH PROPOFOL;  Surgeon: Daneil Dolin, MD;  [facility];  Service: Endoscopy;  Laterality: N/A;  . INCISIONAL HERNIA REPAIR N/A 08/26/2013   Procedure: HERNIA REPAIR INCISIONAL WITH MESH;  Surgeon: Jamesetta So, MD;  Location: [facility];  Service: General;  Laterality: N/A;  . IR FLUORO GUIDE PORT INSERTION RIGHT  04/30/2017  . IR US GUIDE VASC ACCESS RIGHT  04/30/2017  . LUNG CANCER SURGERY    . MALONEY DILATION   06/03/2018   Procedure: MALONEY DILATION;  Surgeon: Daneil Dolin, MD;  [facility];  Service: Endoscopy;;  . VIDEO BRONCHOSCOPY WITH ENDOBRONCHIAL NAVIGATION N/A 04/23/2017   Procedure: VIDEO BRONCHOSCOPY WITH ENDOBRONCHIAL NAVIGATION;  Surgeon: Melrose Nakayama, MD;  Location: [facility];  Service: Thoracic;  Laterality: N/A;  . VIDEO BRONCHOSCOPY WITH ENDOBRONCHIAL ULTRASOUND N/A 04/23/2017   Procedure: VIDEO BRONCHOSCOPY WITH ENDOBRONCHIAL ULTRASOUND;  Surgeon: Melrose Nakayama, MD;  Location: [facility];  Service: Thoracic;  Laterality: N/A;     reports that she quit smoking about 11 years ago. Her smoking use included cigarettes. She quit after 20.00 years of use. She has never used smokeless tobacco. She reports that she does not drink alcohol or use drugs.  Allergies  Allergen Reactions  . Lisinopril Cough    Family History  Problem Relation Age of Onset  . Diabetes Mother   . Hypertension Mother   . Diabetes Father   . Asthma Unknown   . Cancer Unknown   . Heart attack Sister        X2  . Colon cancer Neg Hx     Prior to Admission medications   Medication Sig Start Date End Date Taking? Authorizing Provider  acetaminophen (ACETAMINOPHEN 8 HOUR) 650 MG CR tablet Take 650 mg by mouth every 4 (four) hours as needed for pain.   Yes [provider]  albuterol (PROAIR HFA) 108 (90 BASE) MCG/ACT inhaler Inhale 2 puffs into the lungs every 4 (four) hours as needed for wheezing or shortness of breath.   Yes [provider]  alendronate (FOSAMAX) 70 MG tablet Take 70 mg by mouth every Friday.    Yes [provider]  antiseptic oral rinse (BIOTENE) LIQD 15 mLs by Mouth Rinse route 2 (two) times daily.   Yes [provider]  aspirin EC 81 MG tablet Take 81 mg by mouth daily.   Yes [provider]  Atezolizumab (TECENTRIQ IV) Inject into the vein every 21 ( twenty-one) days. Every 3 weeks    Yes [provider]    atorvastatin (LIPITOR) 40 MG tablet Take 40 mg by mouth at bedtime.    Yes [provider]  buPROPion (WELLBUTRIN SR) 150 MG 12 hr tablet Take 150 mg by mouth 2 (two) times daily.    Yes [provider]  carbidopa-levodopa (SINEMET IR) 25-100 MG tablet Take 1 tablet by mouth 2 (two) times daily.   Yes [provider]  cholecalciferol (VITAMIN D) 1000 units tablet Take 1,000 Units by mouth daily.   Yes [provider]  dronabinol (MARINOL) 2.5 MG capsule Take 2.5 mg by mouth 2 (two) times daily.   Yes [provider]  furosemide (LASIX) 20 MG tablet Take 1 tablet (20 mg total) by mouth daily. 06/07/18  Yes Johnson, Clanford L, MD  gabapentin (NEURONTIN) 300 MG capsule Take 600 mg by mouth at bedtime.    Yes [provider]  ipratropium-albuterol (DUONEB) 0.5-2.5 (3)  MG/3ML SOLN Inhale 3 mLs into the lungs 4 (four) times daily.    Yes [provider]  LANTUS SOLOSTAR 100 UNIT/ML Solostar Pen Inject 7 Units into the skin at bedtime. 06/04/18  Yes Johnson, Clanford L, MD  lidocaine-prilocaine (EMLA) cream Apply 1 application topically daily as needed. 06/04/18  Yes Johnson, Clanford L, MD  losartan (COZAAR) 50 MG tablet Take 50 mg by mouth daily. 02/22/18  Yes [provider]  metoprolol succinate (TOPROL-XL) 25 MG 24 hr tablet Take 1 tablet (25 mg total) by mouth daily. 06/26/15  Yes Jettie Booze, MD  mometasone-formoterol [facility]) 200-5 MCG/ACT AERO Inhale 2 puffs into the lungs 2 (two) times daily. 02/08/18  Yes Kathie Dike, MD  nitroGLYCERIN (NITROSTAT) 0.4 MG SL tablet Place 0.4 mg under the tongue every 5 (five) minutes as needed for chest pain.   Yes [provider]  omeprazole (PRILOSEC) 20 MG capsule Take 20 mg by mouth 2 (two) times daily. 04/23/18  Yes [provider]  ondansetron (ZOFRAN ODT) 4 MG disintegrating tablet Take 1 tablet (4 mg total) by mouth every 8 (eight) hours as needed for nausea or  vomiting. 05/22/18  Yes Francine Graven, DO  OXYGEN Inhale 3 L into the lungs continuous.   Yes [provider]  polyethylene glycol powder (GLYCOLAX/MIRALAX) powder Take 17 g by mouth daily as needed.   Yes [provider]  potassium chloride (KLOR-CON 10) 10 MEQ tablet TAKE 1 TABLET (10 MEQ TOTAL) BY MOUTH DAILY. 06/07/18  Yes Johnson, Clanford L, MD  rOPINIRole (REQUIP) 0.5 MG tablet Take 1 tablet (0.5 mg total) by mouth at bedtime. 06/04/18  Yes Johnson, Clanford L, MD  sodium chloride (OCEAN) 0.65 % SOLN nasal spray Place 1 spray into both nostrils every 3 (three) hours as needed for congestion. 06/04/18  Yes Johnson, Clanford L, MD  zolpidem (AMBIEN) 5 MG tablet Take 5 mg by mouth at bedtime as needed. 06/16/18  Yes [provider]  B-D ULTRAFINE III SHORT PEN 31G X 8 MM MISC See admin instructions. 12/27/17   [provider]    Physical Exam: Vitals:   06/30/2018 2010 07/07/2018 2300 15-Jul-2018 0000  BP: 130/65 (!) 186/80 (!) 173/84  Pulse: 77 74 78  Resp: 18    Temp: 97.7 F (36.5 C)    TempSrc: Oral    SpO2: 99% 100% 98%  Weight: 75.8 kg (167 lb)    Height: 5\' 4"  (1.626 m)      Constitutional: NAD, calm, comfortable Vitals:   06/28/2018 2010 06/15/2018 2300 07-15-2018 0000  BP: 130/65 (!) 186/80 (!) 173/84  Pulse: 77 74 78  Resp: 18    Temp: 97.7 F (36.5 C)    TempSrc: Oral    SpO2: 99% 100% 98%  Weight: 75.8 kg (167 lb)    Height: 5\' 4"  (1.626 m)     Eyes: lids and conjunctivae normal ENMT: Mucous membranes are moist.  Neck: normal, supple Respiratory: clear to auscultation bilaterally. Normal respiratory effort. No accessory muscle use.  Cardiovascular: Regular rate and rhythm, no murmurs. No extremity edema. Abdomen: Minimal distention with tenderness throughout which is worse to the right lower quadrant.  No guarding rigidity or rebound noted. Musculoskeletal:  No joint deformity upper and lower extremities.   Skin: no rashes, lesions,  ulcers.    Labs on Admission: I have personally reviewed following labs and imaging studies  CBC: Recent Labs  Lab 06/09/2018 2209  WBC 18.9*  HGB 12.4  HCT 39.2  MCV 82.0  PLT 169   Basic Metabolic Panel: Recent Labs  Lab 07/01/2018 2209  NA 128*  K 4.8  CL 92*  CO2 25  GLUCOSE 141*  BUN 13  CREATININE 1.15*  CALCIUM 8.7*   GFR: Estimated Creatinine Clearance: 37.6 mL/min (A) (by C-G formula based on SCr of 1.15 mg/dL (H)). Liver Function Tests: Recent Labs  Lab 07/02/2018 2209  AST 19  ALT 8  ALKPHOS 104  BILITOT 0.5  PROT 7.5  ALBUMIN 3.0*   Recent Labs  Lab 06/26/2018 2209  LIPASE 42   No results for input(s): AMMONIA in the last 168 hours. Coagulation Profile: No results for input(s): INR, PROTIME in the last 168 hours. Cardiac Enzymes: No results for input(s): CKTOTAL, CKMB, CKMBINDEX, TROPONINI in the last 168 hours. BNP (last 3 results) No results for input(s): PROBNP in the last 8760 hours. HbA1C: No results for input(s): HGBA1C in the last 72 hours. CBG: No results for input(s): GLUCAP in the last 168 hours. Lipid Profile: No results for input(s): CHOL, HDL, LDLCALC, TRIG, CHOLHDL, LDLDIRECT in the last 72 hours. Thyroid Function Tests: No results for input(s): TSH, T4TOTAL, FREET4, T3FREE, THYROIDAB in the last 72 hours. Anemia Panel: No results for input(s): VITAMINB12, FOLATE, FERRITIN, TIBC, IRON, RETICCTPCT in the last 72 hours. Urine analysis:    Component Value Date/Time   COLORURINE YELLOW 05/24/2018 2222   APPEARANCEUR CLOUDY (A) 05/24/2018 2222   LABSPEC 1.038 (H) 05/24/2018 2222   PHURINE 6.0 05/24/2018 2222   GLUCOSEU NEGATIVE 05/24/2018 2222   HGBUR SMALL (A) 05/24/2018 2222   BILIRUBINUR NEGATIVE 05/24/2018 2222   KETONESUR NEGATIVE 05/24/2018 2222   PROTEINUR NEGATIVE 05/24/2018 2222   UROBILINOGEN 0.2 04/19/2012 1411   NITRITE NEGATIVE 05/24/2018 2222   LEUKOCYTESUR LARGE (A) 05/24/2018 2222    Radiological Exams on  Admission: Ct Chest W Contrast  Result Date: July 22, 2018 CLINICAL DATA:  Abdominal pain starting 2 weeks ago. Patient was recently discharged from hospital 3 days ago for bowel obstruction. Patient complains of nausea but denies vomiting. Last bowel movement was yesterday. EXAM: CT CHEST, ABDOMEN, AND PELVIS WITH CONTRAST TECHNIQUE: Multidetector CT imaging of the chest, abdomen and pelvis was performed following the standard protocol during bolus administration of intravenous contrast. CONTRAST:  9mL ISOVUE-300 IOPAMIDOL (ISOVUE-300) INJECTION 61% COMPARISON:  05/24/2018 chest CT, 05/25/2018 CT abdomen and pelvis FINDINGS: CT CHEST FINDINGS Cardiovascular: Conventional branch pattern of the great vessels off the aortic arch. There appears be complete occlusion of the left common carotid artery beyond the origin. Moderate atherosclerosis of the nonaneurysmal thoracic aorta. No dissection or aneurysm. Heart size is normal without pericardial effusion. Scattered left main and three-vessel coronary arteriosclerosis is noted. Satisfactory opacification of the pulmonary arteries without large central pulmonary embolus noted. Technique not tailored toward assessment of pulmonary emboli. Mediastinum/Nodes: Normal sized thyroid gland with cystic nodules bilaterally measuring up to 14 mm on the left and 9 mm on the right. No mediastinal nor hilar lymphadenopathy. No axillary adenopathy. Patent trachea and mainstem bronchi. Esophagus is unremarkable. Lungs/Pleura: Stable postsurgical change just inferior to the left hilum. Stable thick rimmed extrapleural cavity involving the medial left lung base at the azygoesphageal recess. Diffuse centrilobular emphysema is again demonstrated. At the right lung apex is a stable 16 x 9 mm spiculation, nonspecific but likely to represent apical scarring. Redemonstration of pulmonary consolidation superimposed on centrilobular emphysema along the dependent aspect of the left lower lobe  and posterior left upper lobe. There has been some clearing  of the ground-glass opacities from the right lower lobe since prior. No effusion or pneumothorax. Musculoskeletal: Multiple bilateral chronic rib deformities from prior surgery and/or trauma. No acute osseous abnormality is noted. CT ABDOMEN PELVIS FINDINGS Hepatobiliary: Cholecystectomy. Likely reservoir effect accounting for intra and extrahepatic ductal dilatation. No choledocholithiasis. No space-occupying mass of the liver. Pancreas: Normal Spleen: Normal Adrenals/Urinary Tract: Normal bilateral adrenal glands. Symmetric cortical enhancement of both kidneys without nephrolithiasis, enhancing mass nor hydroureteronephrosis. Tiny too small to characterize cysts are noted of the kidneys. The urinary bladder is physiologically distended without focal mural thickening or calculi. Stomach/Bowel: The stomach is decompressed in appearance. There is a small hiatal hernia. The duodenal sweep and ligament of Treitz appear unremarkable. Mild fluid-filled distention of jejunal loops with transmural thickening and adjacent mesenteric edema is seen in the right lower quadrant adjacent to mild fluid-filled distended small bowel. Findings may represent stigmata of recent small-bowel obstruction. Small bowel enteritis is not entirely excluded. Extensive descending and sigmoid diverticulosis without acute diverticulitis. Vascular/Lymphatic: Atherosclerosis of the abdominal aorta with patent branch vessels. No aneurysm, dissection or significant stenosis. No lymphadenopathy by CT size criteria. Reproductive: Hysterectomy.  No adnexal mass. Other: No free air nor free fluid. Musculoskeletal: No acute osseous abnormality. Thoracolumbar spondylosis and bilateral hip joint osteoarthritis. IMPRESSION: Chest CT: 1. Chronic occlusion of the left common carotid artery just distal to its take-off the aortic arch. 2. Moderate atherosclerosis of the nonaneurysmal thoracic aorta. 3.  Left main and three-vessel coronary arteriosclerosis. 4. Centrilobular emphysema is redemonstrated with superimposed pneumonia involving the dependent aspect of the left upper and lower lobes similar in appearance to prior. There has been clearing of ground-glass infiltrates from the right lung since prior. 5. No acute pulmonary embolus. CT AP: 1. Edema of the small bowel mesentery adjacent to mild fluid-filled distention of small bowel loops in the right lower quadrant. Findings may represent small bowel enteritis. No mechanical bowel obstruction is identified. Alternatively this may represent stigmata of recent small-bowel obstruction that is resolving. 2. Left-sided colonic diverticulosis without acute diverticulitis. 3. Status post cholecystectomy and hysterectomy. 4. Tiny too small to characterize renal cysts. Electronically Signed   By: Ashley Royalty M.D.   On: 2018/07/02 00:11   Ct Abdomen Pelvis W Contrast  Result Date: 07/02/2018 CLINICAL DATA:  Abdominal pain starting 2 weeks ago. Patient was recently discharged from hospital 3 days ago for bowel obstruction. Patient complains of nausea but denies vomiting. Last bowel movement was yesterday. EXAM: CT CHEST, ABDOMEN, AND PELVIS WITH CONTRAST TECHNIQUE: Multidetector CT imaging of the chest, abdomen and pelvis was performed following the standard protocol during bolus administration of intravenous contrast. CONTRAST:  75mL ISOVUE-300 IOPAMIDOL (ISOVUE-300) INJECTION 61% COMPARISON:  05/24/2018 chest CT, 05/25/2018 CT abdomen and pelvis FINDINGS: CT CHEST FINDINGS Cardiovascular: Conventional branch pattern of the great vessels off the aortic arch. There appears be complete occlusion of the left common carotid artery beyond the origin. Moderate atherosclerosis of the nonaneurysmal thoracic aorta. No dissection or aneurysm. Heart size is normal without pericardial effusion. Scattered left main and three-vessel coronary arteriosclerosis is noted. Satisfactory  opacification of the pulmonary arteries without large central pulmonary embolus noted. Technique not tailored toward assessment of pulmonary emboli. Mediastinum/Nodes: Normal sized thyroid gland with cystic nodules bilaterally measuring up to 14 mm on the left and 9 mm on the right. No mediastinal nor hilar lymphadenopathy. No axillary adenopathy. Patent trachea and mainstem bronchi. Esophagus is unremarkable. Lungs/Pleura: Stable postsurgical change just inferior to the left hilum. Stable  thick rimmed extrapleural cavity involving the medial left lung base at the azygoesphageal recess. Diffuse centrilobular emphysema is again demonstrated. At the right lung apex is a stable 16 x 9 mm spiculation, nonspecific but likely to represent apical scarring. Redemonstration of pulmonary consolidation superimposed on centrilobular emphysema along the dependent aspect of the left lower lobe and posterior left upper lobe. There has been some clearing of the ground-glass opacities from the right lower lobe since prior. No effusion or pneumothorax. Musculoskeletal: Multiple bilateral chronic rib deformities from prior surgery and/or trauma. No acute osseous abnormality is noted. CT ABDOMEN PELVIS FINDINGS Hepatobiliary: Cholecystectomy. Likely reservoir effect accounting for intra and extrahepatic ductal dilatation. No choledocholithiasis. No space-occupying mass of the liver. Pancreas: Normal Spleen: Normal Adrenals/Urinary Tract: Normal bilateral adrenal glands. Symmetric cortical enhancement of both kidneys without nephrolithiasis, enhancing mass nor hydroureteronephrosis. Tiny too small to characterize cysts are noted of the kidneys. The urinary bladder is physiologically distended without focal mural thickening or calculi. Stomach/Bowel: The stomach is decompressed in appearance. There is a small hiatal hernia. The duodenal sweep and ligament of Treitz appear unremarkable. Mild fluid-filled distention of jejunal loops with  transmural thickening and adjacent mesenteric edema is seen in the right lower quadrant adjacent to mild fluid-filled distended small bowel. Findings may represent stigmata of recent small-bowel obstruction. Small bowel enteritis is not entirely excluded. Extensive descending and sigmoid diverticulosis without acute diverticulitis. Vascular/Lymphatic: Atherosclerosis of the abdominal aorta with patent branch vessels. No aneurysm, dissection or significant stenosis. No lymphadenopathy by CT size criteria. Reproductive: Hysterectomy.  No adnexal mass. Other: No free air nor free fluid. Musculoskeletal: No acute osseous abnormality. Thoracolumbar spondylosis and bilateral hip joint osteoarthritis. IMPRESSION: Chest CT: 1. Chronic occlusion of the left common carotid artery just distal to its take-off the aortic arch. 2. Moderate atherosclerosis of the nonaneurysmal thoracic aorta. 3. Left main and three-vessel coronary arteriosclerosis. 4. Centrilobular emphysema is redemonstrated with superimposed pneumonia involving the dependent aspect of the left upper and lower lobes similar in appearance to prior. There has been clearing of ground-glass infiltrates from the right lung since prior. 5. No acute pulmonary embolus. CT AP: 1. Edema of the small bowel mesentery adjacent to mild fluid-filled distention of small bowel loops in the right lower quadrant. Findings may represent small bowel enteritis. No mechanical bowel obstruction is identified. Alternatively this may represent stigmata of recent small-bowel obstruction that is resolving. 2. Left-sided colonic diverticulosis without acute diverticulitis. 3. Status post cholecystectomy and hysterectomy. 4. Tiny too small to characterize renal cysts. Electronically Signed   By: Ashley Royalty M.D.   On: 22-Jul-2018 00:11    Assessment/Plan Principal Problem:   Enteritis Active Problems:   Adenocarcinoma of lung (HCC)   CKD (chronic kidney disease) stage 3, GFR 30-59  ml/min (HCC)   Essential hypertension   Type II diabetes mellitus (HCC)   CAD (coronary artery disease)   Parkinson's disease (HCC)   Hyponatremia    1. Abdominal pain with nausea and vomiting likely secondary to enteritis and/or cannabinoid hyperemesis .  Will start empiric treatment with IV Zosyn and maintain on clear liquid diet to advance as tolerated.  She is passing flatus and having bowel movements for now.  May consider general surgery consultation once again if her symptoms persist or worsen, or suspicion of SBO redevelopment.  Urine analysis pending.  Notably, she was recently started on Marinol which may be contributing to some of her nausea symptoms with cannabinoid hyperemesis.  Will hold Marinol for now.  2. Hyponatremia.  Is likely related to dehydration as well as diuresis. Will check urine and serum osmolarities, urine sodium, and TSH. Maintain on gentle IVF with NS and recheck BMP at noon. 3. Essential hypertension.  Poor control noted.  Will add hydralazine as needed for significant blood pressure elevations and continue home losartan as well as metoprolol and hold home Lasix. 4. History of COPD with chronic hypoxemia.  She is usually maintained on 3 L nasal cannula at home, but is currently on RA.  Duo nebs as needed for shortness of breath or bronchospasms. 5. Stage IV lung cancer.  CT scan of chest performed.  Follow-up with oncology in the outpatient setting. 6. Type 2 diabetes.  Hold home Lantus and maintain on sliding scale insulin. 7. Parkinson's disease.  Maintain on Sinemet.  PT reevaluation. 8. Chronic diastolic CHF.  Hold home Lasix for now and monitor daily weights as well as I's and O's. 9. GERD.  Maintain on PPI. 10. CKD stage III.  Appears to be at baseline.  Continue to monitor with repeat renal panel.   DVT prophylaxis: Lovenox Code Status: Full Family Communication: Family member at bedside Disposition Plan:Evaluation of abdominal pain/N/V-antibiotics  empirically Consults called:None Admission status: Inpatient, Med-Surg   Delorean Knutzen Darleen Crocker DO [facility] Pager [phone]  If 7PM-7AM, please contact night-coverage [email or web address] Password [email or web address]  07-04-18, 1:12 AM

## 2018-07-08 DEATH — deceased
